# Patient Record
Sex: Male | Born: 1945 | Race: White | Hispanic: No | Marital: Single | Smoking: Never smoker
Health system: Southern US, Community
[De-identification: ages and names within clinical notes are randomized; demographics above are authoritative.]

## PROBLEM LIST (undated history)

## (undated) DIAGNOSIS — I4891 Unspecified atrial fibrillation: Secondary | ICD-10-CM

## (undated) DIAGNOSIS — L0390 Cellulitis, unspecified: Secondary | ICD-10-CM

## (undated) DIAGNOSIS — R0609 Other forms of dyspnea: Secondary | ICD-10-CM

## (undated) DIAGNOSIS — R162 Hepatomegaly with splenomegaly, not elsewhere classified: Secondary | ICD-10-CM

## (undated) DIAGNOSIS — Z972 Presence of dental prosthetic device (complete) (partial): Secondary | ICD-10-CM

## (undated) DIAGNOSIS — I890 Lymphedema, not elsewhere classified: Secondary | ICD-10-CM

## (undated) DIAGNOSIS — I495 Sick sinus syndrome: Secondary | ICD-10-CM

## (undated) DIAGNOSIS — I499 Cardiac arrhythmia, unspecified: Secondary | ICD-10-CM

## (undated) DIAGNOSIS — I2510 Atherosclerotic heart disease of native coronary artery without angina pectoris: Secondary | ICD-10-CM

## (undated) DIAGNOSIS — D649 Anemia, unspecified: Secondary | ICD-10-CM

## (undated) DIAGNOSIS — M069 Rheumatoid arthritis, unspecified: Secondary | ICD-10-CM

## (undated) DIAGNOSIS — R001 Bradycardia, unspecified: Secondary | ICD-10-CM

## (undated) DIAGNOSIS — E119 Type 2 diabetes mellitus without complications: Secondary | ICD-10-CM

## (undated) DIAGNOSIS — J189 Pneumonia, unspecified organism: Secondary | ICD-10-CM

## (undated) DIAGNOSIS — I739 Peripheral vascular disease, unspecified: Secondary | ICD-10-CM

## (undated) DIAGNOSIS — Z7982 Long term (current) use of aspirin: Secondary | ICD-10-CM

## (undated) DIAGNOSIS — M1990 Unspecified osteoarthritis, unspecified site: Secondary | ICD-10-CM

## (undated) DIAGNOSIS — Z9842 Cataract extraction status, left eye: Secondary | ICD-10-CM

## (undated) DIAGNOSIS — M86171 Other acute osteomyelitis, right ankle and foot: Secondary | ICD-10-CM

## (undated) DIAGNOSIS — Z9841 Cataract extraction status, right eye: Secondary | ICD-10-CM

## (undated) DIAGNOSIS — N189 Chronic kidney disease, unspecified: Secondary | ICD-10-CM

## (undated) DIAGNOSIS — M869 Osteomyelitis, unspecified: Secondary | ICD-10-CM

## (undated) DIAGNOSIS — I509 Heart failure, unspecified: Secondary | ICD-10-CM

## (undated) DIAGNOSIS — Z950 Presence of cardiac pacemaker: Secondary | ICD-10-CM

## (undated) DIAGNOSIS — R339 Retention of urine, unspecified: Secondary | ICD-10-CM

## (undated) DIAGNOSIS — I10 Essential (primary) hypertension: Secondary | ICD-10-CM

## (undated) DIAGNOSIS — I00 Rheumatic fever without heart involvement: Secondary | ICD-10-CM

## (undated) DIAGNOSIS — E785 Hyperlipidemia, unspecified: Secondary | ICD-10-CM

## (undated) DIAGNOSIS — D3502 Benign neoplasm of left adrenal gland: Secondary | ICD-10-CM

## (undated) DIAGNOSIS — N183 Chronic kidney disease, stage 3 unspecified: Secondary | ICD-10-CM

## (undated) DIAGNOSIS — I517 Cardiomegaly: Secondary | ICD-10-CM

## (undated) DIAGNOSIS — I96 Gangrene, not elsewhere classified: Secondary | ICD-10-CM

## (undated) HISTORY — DX: Cardiac arrhythmia, unspecified: I49.9

## (undated) HISTORY — PX: COLONOSCOPY: SHX174

## (undated) HISTORY — DX: Other acute osteomyelitis, right ankle and foot: M86.171

## (undated) HISTORY — PX: TOE AMPUTATION: SHX809

## (undated) HISTORY — DX: Cellulitis, unspecified: L03.90

## (undated) HISTORY — PX: TONSILLECTOMY: SUR1361

## (undated) HISTORY — DX: Chronic kidney disease, unspecified: N18.9

## (undated) HISTORY — DX: Heart failure, unspecified: I50.9

---

## 2014-07-05 ENCOUNTER — Inpatient Hospital Stay: Payer: Self-pay | Admitting: Internal Medicine

## 2014-07-05 LAB — CBC WITH DIFFERENTIAL/PLATELET
BASOS ABS: 0.1 10*3/uL (ref 0.0–0.1)
Basophil %: 0.3 %
EOS ABS: 0.1 10*3/uL (ref 0.0–0.7)
Eosinophil %: 0.3 %
HCT: 40.2 % (ref 40.0–52.0)
HGB: 13.4 g/dL (ref 13.0–18.0)
LYMPHS ABS: 1.3 10*3/uL (ref 1.0–3.6)
LYMPHS PCT: 6.7 %
MCH: 30 pg (ref 26.0–34.0)
MCHC: 33.3 g/dL (ref 32.0–36.0)
MCV: 90 fL (ref 80–100)
Monocyte #: 0.9 x10 3/mm (ref 0.2–1.0)
Monocyte %: 4.9 %
Neutrophil #: 16.5 10*3/uL — ABNORMAL HIGH (ref 1.4–6.5)
Neutrophil %: 87.8 %
Platelet: 367 10*3/uL (ref 150–440)
RBC: 4.46 10*6/uL (ref 4.40–5.90)
RDW: 12.4 % (ref 11.5–14.5)
WBC: 18.8 10*3/uL — AB (ref 3.8–10.6)

## 2014-07-05 LAB — URINALYSIS, COMPLETE
BACTERIA: NONE SEEN
BILIRUBIN, UR: NEGATIVE
BLOOD: NEGATIVE
LEUKOCYTE ESTERASE: NEGATIVE
NITRITE: NEGATIVE
Ph: 6 (ref 4.5–8.0)
Protein: NEGATIVE
RBC,UR: 1 /HPF (ref 0–5)
Specific Gravity: 1.03 (ref 1.003–1.030)
Squamous Epithelial: NONE SEEN
WBC UR: 1 /HPF (ref 0–5)

## 2014-07-05 LAB — COMPREHENSIVE METABOLIC PANEL
ALT: 29 U/L
AST: 16 U/L (ref 15–37)
Albumin: 2.5 g/dL — ABNORMAL LOW (ref 3.4–5.0)
Alkaline Phosphatase: 210 U/L — ABNORMAL HIGH
Anion Gap: 8 (ref 7–16)
BILIRUBIN TOTAL: 0.5 mg/dL (ref 0.2–1.0)
BUN: 12 mg/dL (ref 7–18)
Calcium, Total: 8.5 mg/dL (ref 8.5–10.1)
Chloride: 100 mmol/L (ref 98–107)
Co2: 26 mmol/L (ref 21–32)
Creatinine: 1.08 mg/dL (ref 0.60–1.30)
EGFR (African American): >60
EGFR (Non-African Amer.): >60
GLUCOSE: 327 mg/dL — AB (ref 65–99)
Osmolality: 281 (ref 275–301)
POTASSIUM: 3.7 mmol/L (ref 3.5–5.1)
Sodium: 134 mmol/L — ABNORMAL LOW (ref 136–145)
Total Protein: 8.4 g/dL — ABNORMAL HIGH (ref 6.4–8.2)

## 2014-07-05 LAB — PHOSPHORUS: Phosphorus: 3 mg/dL (ref 2.5–4.9)

## 2014-07-05 LAB — PROTIME-INR
INR: 1.1
Prothrombin Time: 14.2 secs (ref 11.5–14.7)

## 2014-07-05 LAB — TROPONIN I

## 2014-07-05 LAB — MAGNESIUM: Magnesium: 1.7 mg/dL — ABNORMAL LOW

## 2014-07-06 LAB — BASIC METABOLIC PANEL
Anion Gap: 8 (ref 7–16)
BUN: 8 mg/dL (ref 7–18)
CHLORIDE: 104 mmol/L (ref 98–107)
CREATININE: 0.91 mg/dL (ref 0.60–1.30)
Calcium, Total: 7.8 mg/dL — ABNORMAL LOW (ref 8.5–10.1)
Co2: 24 mmol/L (ref 21–32)
EGFR (African American): >60
Glucose: 215 mg/dL — ABNORMAL HIGH (ref 65–99)
Osmolality: 277 (ref 275–301)
Potassium: 3.5 mmol/L (ref 3.5–5.1)
SODIUM: 136 mmol/L (ref 136–145)

## 2014-07-06 LAB — CBC WITH DIFFERENTIAL/PLATELET
Basophil #: 0.1 10*3/uL (ref 0.0–0.1)
Basophil %: 0.3 %
EOS ABS: 0.1 10*3/uL (ref 0.0–0.7)
Eosinophil %: 0.5 %
HCT: 33.4 % — ABNORMAL LOW (ref 40.0–52.0)
HGB: 11 g/dL — ABNORMAL LOW (ref 13.0–18.0)
Lymphocyte #: 1.3 10*3/uL (ref 1.0–3.6)
Lymphocyte %: 8.5 %
MCH: 29.7 pg (ref 26.0–34.0)
MCHC: 32.8 g/dL (ref 32.0–36.0)
MCV: 91 fL (ref 80–100)
MONO ABS: 0.9 x10 3/mm (ref 0.2–1.0)
Monocyte %: 5.6 %
Neutrophil #: 13 10*3/uL — ABNORMAL HIGH (ref 1.4–6.5)
Neutrophil %: 85.1 %
Platelet: 257 10*3/uL (ref 150–440)
RBC: 3.68 10*6/uL — AB (ref 4.40–5.90)
RDW: 12.2 % (ref 11.5–14.5)
WBC: 15.3 10*3/uL — ABNORMAL HIGH (ref 3.8–10.6)

## 2014-07-06 LAB — HEMOGLOBIN A1C: HEMOGLOBIN A1C: 10.4 % — AB (ref 4.2–6.3)

## 2014-07-07 LAB — URINE CULTURE

## 2014-07-07 LAB — CBC WITH DIFFERENTIAL/PLATELET
BASOS ABS: 0.1 10*3/uL (ref 0.0–0.1)
Basophil %: 0.5 %
EOS ABS: 0.1 10*3/uL (ref 0.0–0.7)
Eosinophil %: 1.1 %
HCT: 33.2 % — ABNORMAL LOW (ref 40.0–52.0)
HGB: 10.8 g/dL — AB (ref 13.0–18.0)
LYMPHS ABS: 1.4 10*3/uL (ref 1.0–3.6)
LYMPHS PCT: 11.4 %
MCH: 29.2 pg (ref 26.0–34.0)
MCHC: 32.6 g/dL (ref 32.0–36.0)
MCV: 89 fL (ref 80–100)
MONOS PCT: 6.8 %
Monocyte #: 0.8 x10 3/mm (ref 0.2–1.0)
NEUTROS ABS: 9.5 10*3/uL — AB (ref 1.4–6.5)
Neutrophil %: 80.2 %
Platelet: 293 10*3/uL (ref 150–440)
RBC: 3.72 10*6/uL — ABNORMAL LOW (ref 4.40–5.90)
RDW: 12.3 % (ref 11.5–14.5)
WBC: 11.9 10*3/uL — ABNORMAL HIGH (ref 3.8–10.6)

## 2014-07-07 LAB — BASIC METABOLIC PANEL
Anion Gap: 7 (ref 7–16)
BUN: 7 mg/dL (ref 7–18)
CALCIUM: 7.9 mg/dL — AB (ref 8.5–10.1)
CHLORIDE: 106 mmol/L (ref 98–107)
Co2: 24 mmol/L (ref 21–32)
Creatinine: 0.84 mg/dL (ref 0.60–1.30)
EGFR (African American): >60
EGFR (Non-African Amer.): >60
Glucose: 151 mg/dL — ABNORMAL HIGH (ref 65–99)
Osmolality: 275 (ref 275–301)
POTASSIUM: 3.6 mmol/L (ref 3.5–5.1)
SODIUM: 137 mmol/L (ref 136–145)

## 2014-07-07 LAB — LIPID PANEL
Cholesterol: 102 mg/dL (ref 0–200)
HDL Cholesterol: 25 mg/dL — ABNORMAL LOW (ref 40–60)
Ldl Cholesterol, Calc: 55 mg/dL (ref 0–100)
Triglycerides: 112 mg/dL (ref 0–200)
VLDL CHOLESTEROL, CALC: 22 mg/dL (ref 5–40)

## 2014-07-07 LAB — MAGNESIUM: Magnesium: 1.8 mg/dL

## 2014-07-07 LAB — VANCOMYCIN, TROUGH: Vancomycin, Trough: 16 ug/mL (ref 10–20)

## 2014-07-08 LAB — CBC WITH DIFFERENTIAL/PLATELET
Basophil #: 0.1 10*3/uL (ref 0.0–0.1)
Basophil %: 0.7 %
EOS ABS: 0.2 10*3/uL (ref 0.0–0.7)
Eosinophil %: 2.3 %
HCT: 35 % — AB (ref 40.0–52.0)
HGB: 11.9 g/dL — ABNORMAL LOW (ref 13.0–18.0)
LYMPHS PCT: 15.1 %
Lymphocyte #: 1.5 10*3/uL (ref 1.0–3.6)
MCH: 30.1 pg (ref 26.0–34.0)
MCHC: 34 g/dL (ref 32.0–36.0)
MCV: 89 fL (ref 80–100)
MONOS PCT: 7.5 %
Monocyte #: 0.7 x10 3/mm (ref 0.2–1.0)
NEUTROS PCT: 74.4 %
Neutrophil #: 7.4 10*3/uL — ABNORMAL HIGH (ref 1.4–6.5)
Platelet: 343 10*3/uL (ref 150–440)
RBC: 3.95 10*6/uL — ABNORMAL LOW (ref 4.40–5.90)
RDW: 12.5 % (ref 11.5–14.5)
WBC: 9.9 10*3/uL (ref 3.8–10.6)

## 2014-07-08 LAB — BASIC METABOLIC PANEL
Anion Gap: 8 (ref 7–16)
BUN: 7 mg/dL (ref 7–18)
CO2: 24 mmol/L (ref 21–32)
Calcium, Total: 8.2 mg/dL — ABNORMAL LOW (ref 8.5–10.1)
Chloride: 108 mmol/L — ABNORMAL HIGH (ref 98–107)
Creatinine: 0.94 mg/dL (ref 0.60–1.30)
EGFR (African American): >60
EGFR (Non-African Amer.): >60
GLUCOSE: 118 mg/dL — AB (ref 65–99)
OSMOLALITY: 278 (ref 275–301)
Potassium: 3.6 mmol/L (ref 3.5–5.1)
Sodium: 140 mmol/L (ref 136–145)

## 2014-07-08 LAB — SEDIMENTATION RATE: Erythrocyte Sed Rate: >140 mm/hr — ABNORMAL HIGH (ref 0–20)

## 2014-07-09 LAB — BASIC METABOLIC PANEL
Anion Gap: 9 (ref 7–16)
BUN: 10 mg/dL (ref 7–18)
Calcium, Total: 8.5 mg/dL (ref 8.5–10.1)
Chloride: 109 mmol/L — ABNORMAL HIGH (ref 98–107)
Co2: 23 mmol/L (ref 21–32)
Creatinine: 1.08 mg/dL (ref 0.60–1.30)
EGFR (African American): >60
EGFR (Non-African Amer.): >60
GLUCOSE: 113 mg/dL — AB (ref 65–99)
Osmolality: 281 (ref 275–301)
POTASSIUM: 3.4 mmol/L — AB (ref 3.5–5.1)
Sodium: 141 mmol/L (ref 136–145)

## 2014-07-09 LAB — CBC WITH DIFFERENTIAL/PLATELET
BASOS ABS: 0.1 10*3/uL (ref 0.0–0.1)
Basophil %: 1 %
EOS ABS: 0.3 10*3/uL (ref 0.0–0.7)
Eosinophil %: 3.4 %
HCT: 34.3 % — AB (ref 40.0–52.0)
HGB: 11.1 g/dL — AB (ref 13.0–18.0)
Lymphocyte #: 1.5 10*3/uL (ref 1.0–3.6)
Lymphocyte %: 17.6 %
MCH: 29.1 pg (ref 26.0–34.0)
MCHC: 32.3 g/dL (ref 32.0–36.0)
MCV: 90 fL (ref 80–100)
Monocyte #: 0.7 x10 3/mm (ref 0.2–1.0)
Monocyte %: 8.2 %
Neutrophil #: 6.1 10*3/uL (ref 1.4–6.5)
Neutrophil %: 69.8 %
PLATELETS: 358 10*3/uL (ref 150–440)
RBC: 3.8 10*6/uL — AB (ref 4.40–5.90)
RDW: 12.4 % (ref 11.5–14.5)
WBC: 8.7 10*3/uL (ref 3.8–10.6)

## 2014-07-09 LAB — PATHOLOGY REPORT

## 2014-07-10 LAB — BASIC METABOLIC PANEL
Anion Gap: 9 (ref 7–16)
BUN: 10 mg/dL (ref 7–18)
CALCIUM: 8.3 mg/dL — AB (ref 8.5–10.1)
Chloride: 109 mmol/L — ABNORMAL HIGH (ref 98–107)
Co2: 24 mmol/L (ref 21–32)
Creatinine: 1.26 mg/dL (ref 0.60–1.30)
EGFR (African American): >60
Glucose: 134 mg/dL — ABNORMAL HIGH (ref 65–99)
Osmolality: 284 (ref 275–301)
POTASSIUM: 3.7 mmol/L (ref 3.5–5.1)
SODIUM: 142 mmol/L (ref 136–145)

## 2014-07-10 LAB — MAGNESIUM: Magnesium: 1.8 mg/dL

## 2014-07-10 LAB — CULTURE, BLOOD (SINGLE)

## 2014-07-12 LAB — CULTURE, BLOOD (SINGLE)

## 2014-07-13 LAB — WOUND CULTURE

## 2014-09-08 ENCOUNTER — Ambulatory Visit: Payer: Self-pay | Admitting: Podiatry

## 2014-09-12 LAB — WOUND CULTURE

## 2015-01-15 NOTE — Discharge Summary (Signed)
PATIENT NAME:  Tyler FountainWARNING, Gurley A MR#:  [id number] DATE OF BIRTH:  [birth year]  DATE OF ADMISSION:  07/05/2014 DATE OF DISCHARGE:  07/11/2014  ADMITTING DIAGNOSIS: Sepsis due to cellulitis.   DISCHARGE DIAGNOSES: 1. Left great toe osteomyelitis and gangrene, status post left great toe amputation on 07/06/2014 by Dr. Alberteen Spindleline.  2. Left foot cellulitis was group F Staphylococcus as well as Citrobacter koseri, group F streptococcal sepsis due to osteomyelitis status post PICC line placement to the right upper extremity on 07/09/2014.  3. History of diabetes mellitus with hemoglobin A1c 10.5, uncontrolled.  4. Hypomagnesemia.  5. Obesity.   DISCHARGE CONDITION: Stable.   DISCHARGE MEDICATIONS: The patient is to continue acetaminophen/hydrocodone 325 mg/7.5 mg 1 tablet every 4 hours as needed, glipizide 10 mg twice daily, metformin 1 gram twice daily and Rocephin 2 grams IV every 24 hours through 07/20/2014.   The patient is being discharged with home health physical therapy, as well as nurse. Nurse is recommended to continue PICC line care per protocol.   HOME OXYGEN: None.   DIET: Two gram salt, low-fat, low-cholesterol, carbohydrate-controlled diet, regular consistency.   ACTIVITY LIMITATIONS: As tolerated.   REFERRAL: To home health PT as well as RN. as mentioned above.    FOLLOWUP APPOINTMENT: With Dr. Beverely RisenFozia Khan in 2 days after discharge, Dr. Sampson GoonFitzgerald on 07/20/2014 at noon in his office. Also, Dr. Alberteen Spindleline, podiatry, in 2 days after discharge.   CONSULTANTS: Care management, social work, Dr. Sampson GoonFitzgerald, Dr. Alberteen Spindleline, Dr. Wyn Quakerew and Dr. Toni Amendlapacs.   RADIOLOGIC STUDIES: Left foot complete x-ray from 07/05/2014 showed apparent soft tissue deformity with multiple scattered punctate foci of subcutaneous emphysema about the tuft of great toe. No discrete area of osteomyelitis given large field of view. Further evaluation could be performed with dedicated radiographs of the left great toe and a foot MRI is  clinically indicated, according to radiology. Chest portable single view x-ray 07/05/2014 revealed no active disease.   The patient is a 69 year old male with an unremarkable past medical history, although not being seen by a primary care physician for a long period of time. He presented to the hospital with complaints of left foot pain. Please refer to Dr. Jarrett SohoHower's admission note on 07/05/2014. On arrival to the hospital, the patient's temperature was 99.5, pulse 136, respiration rate was 22, blood pressure 199/95, saturation was 97% on room air. Physical exam revealed left great toe gross areas of necrosis with foul odor as well as discharge and surrounding left lower extremity erythema with edema and diminished sensation.   [name] laboratory data done on admission showed sodium level of 134, glucose of 327, magnesium of 1.7. Hemoglobin A1c was found to be 10.4. Otherwise BMP was unremarkable. [name] liver enzymes revealed an albumin of 2.5, alkaline phosphatase 210. Total protein was 8.4. Otherwise, liver enzymes were unremarkable. Troponin was less than 0.02. CBC: White blood cell count was 18.8, hemoglobin was 13.4, platelet count was 367. Absolute neutrophil count was elevated at 16.5. Coagulation panel was unremarkable. Blood cultures taken on 07/05/2014 revealed group F streptococcus in aerobic as well as anaerobic bottles. Urine culture did not show any growth. Urinalysis was unremarkable. [name] lactic acid level was 1.4.   EKG showed sinus tachycardia at 116 beats per minute, a right bundle-branch block, as well as left anterior fascicular block, but no acute ST-T changes were noted. A chest x-ray was unremarkable. Left foot x-ray revealed abnormalities.   The patient was admitted to the hospital for further  evaluation. Consultation with Dr. Alberteen Spindle was obtained due to left toe osteomyelitis and gangrene of the left hallux. Dr. Alberteen Spindle recommended getting debridement as well as  amputation done of the left hallux, which was performed on 07/06/2014. Postoperatively, the patient did not do badly. He was continued on broad spectrum antibiotic until identification of wound cultures as well as blood cultures came back. Blood cultures, as mentioned above, showed group F streptococcus. Wound cultures done during the operation on 07/06/2014, anaerobic as well as aerobic cultures showed Staphylococcus aureus, methicillin sensitive, moderate growth, as well as Citrobacter koseri, light growth. Staphylococcus was resistant to erythromycin; however, sensitive to all antibiotics. Citrobacter koseri was also sensitive to all antibiotics. The wound cultures also revealed group F Streptococcus, moderate growth.   It was felt that the patient had Streptococcus bacteremia sepsis due to osteomyelitis. Repeated blood cultures then on 07/07/2014 did not show any growth. The patient was seen by Dr. Sampson Goon. who recommended antibiotic therapy. Dr. Sampson Goon recommended antibiotics through 07/20/2014, which would be approximately 9 to 10 days from the day of discharge, 07/11/2014. The patient is to follow up with Dr. Sampson Goon on 07/20/2014 at noon in his office for consideration of oral antibiotic therapy. On the day of discharge, the patient felt satisfactory, did not complain of any significant discomfort or pain. His vitals were stable with temperature of 98.6, pulse was 89 to 104, respiration rate was 18, blood pressure 156/84, saturation was 94% to 96% on room air at rest. The patient is to continue followup with Dr. Alberteen Spindle in regards to antibiotic administration. PICC was placed to right upper extremity on 07/09/2014.   In regards to diabetes mellitus, the patient's hemoglobin A1c was found to be 10.5. It was felt to be poorly controlled diabetes mellitus, which led him to foot wound and osteomyelitis. The patient was initially started on insulin; however, because of concerns of compliance, his  medications were changed to oral. The patient is to continue glipizide as well as metformin and to establish a primary physician, Dr. Beverely Risen, in the next few days after discharge. In regards to hypomagnesemia, which was noted during the day of admission, the patient's magnesium level was replenished. The patient is being discharged in stable condition with the above-mentioned medications and followup.   TIME SPENT: 40 minutes on this patient.     ____________________________ Katharina Caper, MD rv:TT D: 07/11/2014 12:38:46 ET T: 07/11/2014 14:41:34 ET JOB#: [id number]  cc: Katharina Caper, MD, <Dictator> Lyndon Code, MD Stann Mainland. Sampson Goon, MD Linus Galas, [name] Shantee Hayne MD ELECTRONICALLY SIGNED 07/23/2014 12:47

## 2015-01-15 NOTE — Op Note (Signed)
PATIENT NAME:  Tyler FountainWARNING, Tyler [name] MR#:  [id number] DATE OF BIRTH:  1946/08/26  DATE OF PROCEDURE:  07/09/2014  PREOPERATIVE DIAGNOSIS:  Sepsis due to cellulitis and osteomyelitis of the left foot.   POSTOPERATIVE DIAGNOSIS:  Sepsis due to cellulitis and osteomyelitis of the left foot.   PROCEDURES:  1. Ultrasound guidance for vascular access to right basilic vein.  2. Fluoroscopic guidance for placement of catheter.  3. Insertion of peripherally inserted central venous catheter, right arm.  SURGEON: Annice NeedyJason S. Jayceion Lisenby, MD.  ANESTHESIA: Local.   ESTIMATED BLOOD LOSS: Minimal.   INDICATION FOR PROCEDURE: This is [name] 69 year old gentleman with osteomyelitis of his foot who needs extended IV antibiotics and we are  asked to place [name] PICC line.   DESCRIPTION OF PROCEDURE: [name] right arm was sterilely prepped and draped, and [name] sterile surgical field was created. The right basilic vein was accessed under direct ultrasound guidance without difficulty with [name] micropuncture needle and permanent image was recorded. 0.018 wire was then placed into the superior vena cava. Peel-away sheath was placed over the wire. [name] single lumen peripherally inserted central venous catheter was then placed over the wire and the wire and peel-away sheath were removed. The catheter tip was placed into the superior vena cava and was secured at the skin at 37 cm with [name] sterile dressing. The catheter withdrew blood well and flushed easily with heparinized saline. The patient tolerated procedure well.   ____________________________ Annice NeedyJason S. Ariyannah Pauling, MD jsd:lt D: 07/10/2014 10:54:50 ET T: 07/10/2014 11:12:59 ET JOB#: [id number]  cc: Annice NeedyJason S. Srihaan Mastrangelo, MD, <Dictator> Annice NeedyJASON S Dreyson Mishkin MD ELECTRONICALLY SIGNED 07/10/2014 12:39

## 2015-01-15 NOTE — Consult Note (Signed)
Psychiatry: Patient seen and chart reviewed. Patient has no new complaint. Affect euthymic. Says pain is well controlled. No evidence of bizarre or impaired thinking. Affect euthymic, thoughts lucid.   Supportive counceling. No change to treatment and no indication for medication.  Electronic Signatures: Kriston Mckinnie, Jackquline DenmarkJohn T (MD)  (Signed on 14-Oct-15 18:52)  Authored  Last Updated: 14-Oct-15 18:52 by Audery Amellapacs, Ab T (MD)

## 2015-01-15 NOTE — H&P (Signed)
PATIENT NAME:  Tyler [name], BARTELT MR#:  [id number] DATE OF BIRTH:  [birth year]  DATE OF ADMISSION:  07/05/2014  REFERRING PHYSICIAN:  Soyla Murphy, MD   PRIMARY CARE PHYSICIAN:  None.  CHIEF COMPLAINT:  Foot pain.   HISTORY OF PRESENT ILLNESS:  This is a 69 year old Caucasian gentleman with a significant past medical history, however, has no PCP or sought medical care for numerous years, presenting with left foot pain. He describes a 3- to 4-day duration of left foot pain. He described it only as "pain." It is 8 to 9 out of 10 in intensity, worse over his great toe with radiation to the entire foot, worse with walking. No relieving factors. Associated with erythema and edema, as well as foul discharge. The patient himself denies any fevers, chills, or further symptomatology.   REVIEW OF SYSTEMS: CONSTITUTIONAL:  Denies fever, chills, fatigue, or weakness.  EYES:  No blurred vision, double vision, or eye pain. EARS, NOSE, AND THROAT:  Denies tinnitus, ear pain, or hearing loss.  RESPIRATORY:  Denies cough or shortness of breath. CARDIOVASCULAR:  Denies chest pain, palpitations, or edema. GASTROINTESTINAL: Denies nausea, vomiting, diarrhea, or abdominal pain. GENITOURINARY:  Denies dysuria or hematuria.  ENDOCRINE:  Denies nocturia or thyroid problems.  HEMATOLOGIC AND LYMPHATIC:  Denies easy bruising or bleeding. SKIN:  Positive for erythematous region of the left leg going up to mid shin, as well as necrosis of the left great toe. Otherwise, no further lesions, rashes, or cyanosis.  MUSCULOSKELETAL:  Positive for left leg pain as described above. Denies any neck, back, shoulder, knee, or hip pain. Denies further arthritic symptoms.  NEUROLOGIC:  Positive for numbness in the bilateral lower extremities, peripheral of the feet; however, this is chronic. Denies any weakness.  PSYCHIATRIC:  Denies anxiety or depressive symptoms.   Otherwise, full review of systems performed by me is negative.    PAST MEDICAL HISTORY:  None, however, has no PCP and has not sought medical care in numerous years.  SOCIAL HISTORY:  Denies alcohol, tobacco, or drug usage.   FAMILY HISTORY:  Positive for type 2 diabetes.   ALLERGIES:  No known drug allergies.   HOME MEDICATIONS:  None.   PHYSICAL EXAMINATION: VITAL SIGNS:  Temperature 99.5, heart rate 136, respirations 22, blood pressure 199/95, saturating 97% on room air. Weight 111.1 kg, BMI 28.3.  GENERAL:  Well-nourished, well-developed Caucasian gentleman, currently in no acute distress.  HEAD:  Normocephalic, atraumatic.  EYES:  Pupils are equal and reactive to light. Extraocular muscles are intact. No scleral icterus.  MOUTH:  Moist mucosal membranes. Dentition is intact. No abscess noted.  EARS, NOSE, AND THROAT:  Clear without exudates. No external lesions.  NECK:  Supple. No thyromegaly. No nodules. No JVD.  PULMONARY:  Clear to auscultation bilaterally without wheezes, rales, or rhonchi. No accessory muscles. Good respiratory effort.  CHEST:  Nontender to palpation.  CARDIOVASCULAR:  S1, S2 tachycardic. No murmurs, rubs, or gallops. The left lower extremity has edema 2+ of the knee. Range of motion is full in all extremities.  NEUROLOGIC:  Cranial nerves II through XII are intact. No gross focal neurological deficits. Gross sensation in the feet but diminished.  SKIN:  Left great toe has gross areas of necrosis with foul odor and discharge, which is serous. There is also erythema up to around the mid shin of the left lower extremity with edema as stated above. Sensation is diminished as stated above.  PSYCHIATRIC:  Mood and affect are  within normal limits. The patient is awake, alert, and oriented x 3. Insight and judgment are intact.   LABORATORY DATA:  Sodium 134, potassium 3.7, chloride 100, bicarbonate 26, BUN 12, creatinine 1.08, glucose 327, magnesium 1.7. LFTs: Protein 8.4, albumin 2.5, alkaline phosphatase 210, otherwise within  normal limits. WBC 18.8, hemoglobin 13.4, platelets 367. Urinalysis is negative for evidence of infection but does have glucosuria with lactic acid of 1.4.   Chest x-ray performed: No acute cardiopulmonary process.   X-ray of the foot performed: Soft tissue deformity with as well as subcutaneous emphysema noted in the great toe.   ASSESSMENT AND PLAN:  A 69 year old Caucasian gentleman without significant past medical history, presenting with left foot pain.   1.  Sepsis due to cellulitis plus potential osteomyelitis of the left great toe indicated by heart rate, leukocytosis, and respiratory rate on arrival. Intravenous fluid received in the Emergency Department, 30 mL/kg bolus. Will continue IV fluid hydration to keep mean arterial pressure greater than 65. He does not meet severe sepsis criteria. Lactic acid was normal. Panculture. Antibiotics with vancomycin and Zosyn. Will also consult podiatry.  2.  Hyperglycemia. I suspect this patient does have diabetes. Add insulin sliding scale with q. 6 hour Accu-Cheks. Will check hemoglobin A1C.  3.  Hypomagnesemia. Replace to goal of 2.  4.  Venous thromboembolism prophylaxis with sequential compression devices. Will hold heparin in preparation for suspected surgery.   CODE STATUS:  The patient is a full code.   TIME SPENT:  45 minutes.   ____________________________ Cletis Athensavid K. Alaiza Yau, MD dkh:nb D: 07/05/2014 22:42:26 ET T: 07/05/2014 23:05:16 ET JOB#: [id number]  cc: Cletis Athensavid K. Sheila Gervasi, MD, <Dictator> Jasa Dundon Synetta ShadowK Salbador Fiveash MD ELECTRONICALLY SIGNED 07/06/2014 0:46

## 2015-01-15 NOTE — Consult Note (Signed)
PATIENT NAME:  Tyler [name], Tyler [name] MR#:  [id number] DATE OF BIRTH:  [birth year]  DATE OF CONSULTATION:  07/07/2014  CONSULTING PHYSICIAN:  Stann Mainland. Sampson Goon, MD  REQUESTING PHYSICIAN:  Dr. Allena Katz   CONSULTING PODIATRIST: Linus Galas, [name]   REASON FOR CONSULTATION: Diabetic foot infection and osteomyelitis.   HISTORY OF PRESENT ILLNESS: This is a pleasant 69 year old gentleman who has not seen a primary care doctor in many years who has had ongoing left foot pain with a nonhealing sore that he says has been present for several years. It progressed over several days prior to admission. There was erythema and edema and also drainage from the wound. The patient was seen in the Emergency Room where he was found to have sepsis and cellulitis with likely underlying osteomyelitis. His white blood count was elevated at 18,000. The patient had an x-ray that showed soft tissue deformity and possible osteomyelitis. He underwent on October 13 surgery by Dr. Alberteen Spindle with amputation of the left great toe and excisional debridement of infected tissue. He had cultures sent from the left great distal toe phalanx. Clinically, he is improving. He is on IV antibiotics at this point. We are consulted for further antibiotic management.   PAST MEDICAL HISTORY: The patient denies any but he has an elevated A1c in his diabetes.   PAST SURGICAL HISTORY: The patient denies.   FAMILY HISTORY: Noncontributory.   SOCIAL HISTORY: The patent denies alcohol, tobacco, or drugs.   REVIEW OF SYSTEMS: There are 11 systems reviewed and negative except as per HPI.   PHYSICAL EXAMINATION:  VITAL SIGNS: Temperature 98.7 he has been afebrile since admission. Pulse 87, blood pressure 152/81, respirations 20, saturation 96% on room air.  GENERAL: He is disheveled, lying in bed in no acute distress.  HEENT: Pupils equal, round, reactive to light and accommodation. Extraocular movements are intact. Sclerae are anicteric. Oropharynx clear.   NECK: Supple.  HEART: Regular.  LUNGS: Clear to auscultation bilaterally.  ABDOMEN: Soft, nontender, nondistended  EXTREMITIES: He foot is wrapped postoperatively.  NEUROLOGIC: He is alert and oriented x 3.  Grossly nonfocal neurologic exam.   LABORATORY DATA AND IMAGING:  Left foot, apparent soft tissue deformity with scattered punctate foci subcutaneous emphysema at the tuft of the great toe.  Chest x-ray October 12 showed no active disease. Microbiology blood cultures done October 12 are growing gram-positive cocci in chains in 2 of 2 blood cultures. Urine culture is negative. Follow-up blood cultures after the 14th  are no growth to date. Cultures from bone done October 13 of the left great toe are being followed for possible pathogen. There are a few gram-positive cocci in pairs and few gram variable rods.  White blood count on admission was 18.8; currently it is 11.9, hemoglobin 10.8, platelets 293,000. Renal function shows normal creatinine 0.84. A1c is elevated at 10.4. LFTs showed elevated alkaline phosphatase and low albumin at 2.5,  otherwise normal. INR was normal. Urinalysis was negative.   IMPRESSION: A 69 year old gentleman with poorly controlled diabetes which he was unaware of and he had not sought medical care for many years. He was admitted with chronic toe wound that progressed to the infection with likely underlying osteomyelitis. He underwent amputation October 13. He also has bacteremia with gram-positive cocci in chains. He has been on IV antibiotics since admission and has stabilized with now a normalizing white count. He has been afebrile.   RECOMMENDATIONS:  1. Continue vancomycin and Zosyn pending identification of the cultures. He likely has  group B strep bacteremia, but we can await cultures.  2. He would likely need at least a 2 week course of IV antibiotics. Further recommendations would be based on culture results.  3. Once his followup cultures are cleared he can  have a PICC line placed.   4. He will need wound care as well as better diabetic control.   Thank you for the consultation.  We will be glad to follow with you.     ____________________________ Stann Mainlandavid P. Sampson GoonFitzgerald, MD dpf:JT D: 07/07/2014 20:53:48 ET T: 07/07/2014 22:47:06 ET JOB#: [id number]  cc: Stann Mainlandavid P. Sampson GoonFitzgerald, MD, <Dictator> DAVID Sampson GoonFITZGERALD MD ELECTRONICALLY SIGNED 07/20/2014 9:53

## 2015-01-15 NOTE — Consult Note (Signed)
PATIENT NAME:  Tyler FountainWARNING, Tyler [name] MR#:  [id number] DATE OF BIRTH:  [birth year]  DATE OF CONSULTATION:  07/06/2014  REFERRING PHYSICIAN:   CONSULTING PHYSICIAN:  Tyler [name] Tyler [name]  REASON FOR CONSULTATION: This is [name] 69 year old male who was recently admitted to the hospital with sepsis. He has described [name] recent duration of foot pain on his left foot for the past several days. He denies any specific injury. It started in the great toe and now it is spreading up onto the foot. He denies any previous past medical history.    PAST MEDICAL HISTORY: Unremarkable. Denies any previous diagnosis of diabetes. He has not seen his primary care in at least 2 years.   PAST SURGICAL HISTORY: Tonsillectomy as [name] child.   MEDICATIONS: None.   ALLERGIES: No known drug allergies.   FAMILY HISTORY: Positive for diabetes type 2.   SOCIAL HISTORY: Denies any alcohol or tobacco use.   REVIEW OF SYSTEMS: CONSTITUTIONAL: Denies any fever or chills.  EYES: Denies blurry or double vision.  EARS, NOSE AND THROAT: No tinnitus, ear pain or hearing loss.  RESPIRATORY: Denies cough or shortness of breath.  CARDIOVASCULAR: Denies chest pain, palpitations, or edema.  GASTROINTESTINAL: No nausea, vomiting, or abdominal pain.  GENITOURINARY: No dysuria or hematuria.  ENDOCRINE: Denies nocturia or thyroid problems.  HEMATOLOGIC AND LYMPHATIC: Denies easy bruising or bleeding.  INTEGUMENT: Has some significant redness and swelling in the left foot with some drainage and foul odor from around the great toe.  MUSCULOSKELETAL: Relates some pain in the left foot and leg. Otherwise denies any arthritic symptoms.  NEUROLOGICAL: Some numbness out into the toes.  PSYCHIATRIC: Denies anxiety or depressive symptoms.   PHYSICAL EXAMINATION: VASCULAR: DP and PT pulses are palpable. Capillary filling time intact with the exception of the left hallux which is gangrenous.  NEUROLOGICAL: There is loss of light touch sensation to the  forefoot and digits. He does pick his sensation back up at about the level of the mid foot and proximal up to the ankle.  INTEGUMENT: The skin is warm, dry, and supple. There is significant edema and erythema in the left foot with gangrenous changes to the entire distal tip of the left hallux. [name] heavy amount of purulent and malodorous drainage is present.  MUSCULOSKELETAL: There appears to be adequate range of motion. Muscle testing is deferred.   X-RAYS: Multiple views of the left foot reveal destructive changes at the distal phalanx on the left hallux consistent with osteomyelitis. There are signs of subcutaneous gas in the tissues from about the IPJ distal. Also noted are significant small and medium vessel calcifications in the level of the foot and ankle.   LABORATORY: The patient was admitted with [name] white count of 18.8 thousand. Significant left shift in neutrophils.   IMPRESSION: 1.  Osteomyelitis with gangrene, left hallux.  2.  Probable undiagnosed diabetes.   PLAN: The patient is n.p.o. currently. We will obtain consent form for debridement and amputation of the left hallux. I discussed with the patient that we need to remove all of the infected tissue and portion of the toe, but this may need redebridement depending on his healing. We will plan for surgery later on this afternoon since he has been n.p.o. since breakfast. We will continue on his antibiotics and follow accordingly.    ____________________________ Tyler [name] Tyler [name], [name] tc:at D: 07/06/2014 13:28:22 ET T: 07/06/2014 13:52:57 ET JOB#: [id number]  cc: Tyler [name] Tyler [name], [name], <Dictator> Tyler [name] [name] ELECTRONICALLY SIGNED 07/19/2014 11:05

## 2015-01-15 NOTE — Op Note (Signed)
PATIENT NAME:  Tyler FountainWARNING, Orvell A MR#:  [id number] DATE OF BIRTH:  Mar 07, 1946  DATE OF PROCEDURE:  07/06/2014  PREOPERATIVE DIAGNOSIS: Osteomyelitis with gangrene left great toe.   POSTOPERATIVE DIAGNOSIS: Osteomyelitis with gangrene, left great toe.   PROCEDURE: Amputation left great toe with excisional debridement infected tissue.   ANESTHESIA: General LMA.   HEMOSTASIS: Pneumatic tourniquet left ankle 250 mmHg.   ESTIMATED BLOOD LOSS: Minimal.   PATHOLOGY: Left great toe.   CULTURES: Bone cultures left great toe distal phalanx.   DRAINS: None.   IMPLANTABLES: Stimulan Rapid Cure antibiotic beads impregnated with gentamicin and vancomycin.   COMPLICATIONS: None apparent.   OPERATIVE INDICATIONS: This is a 69 year old male with recent admission due to gangrenous changes with osteomyelitis in his left great toe. Gas was noted in the tissues on x-ray and decision was made for amputation of the left great toe.   OPERATIVE PROCEDURE: The patient was taken to the operating room and placed on the table in the supine position. Following satisfactory general anesthesia a pneumatic tourniquet was applied at the level of the left ankle and the foot was prepped and draped in the usual sterile fashion. The foot was exsanguinated and the tourniquet was inflated to 250 mmHg. Attention was then directed to the distal aspect of the left foot where a severely gangrenous and necrotic left great toe was noted. An elliptical-type incision was made coursing medial to lateral around the base of the great toe. The incision was carried sharply down to the level of the bone and dissection was carried back proximally to the joint where the toe was disarticulated at the metatarsophalangeal joint. There was noted to be heavy purulence and necrosis noted in the dorsal and plantar tissues at the base of the toe. Following the amputation excisional debridement of the purulent and necrotic tissue remaining along the skin  edges was performed using a VersaJet debrider on a setting of 6. Following debridement there was noted to be good healthy-appearing tissues. The wound was then copiously irrigated with pulsed irrigation with the remaining portion of the 3 liter bag to the wound. The incision was then partially closed using 4-0 nylon simple interrupted sutures. Through the distal opening the wound was then packed with Stimulan Rapid Cure beads impregnated with vancomycin and gentamicin into the wound. The remainder of the incision was then closed using 4-0 nylon simple interrupted sutures. Xeroform, 4 x 4s, fluffs, ABDs, and a Kerlix were then applied to the left foot and the tourniquet was released. An additional Kerlix and an Ace wrap were then applied for compression. The patient was taken to the PACU with vital signs stable and in good condition, having tolerated the procedure well.    ____________________________ Linus Galasodd Shallen Luedke, [name] tc:bu D: 07/06/2014 19:45:41 ET T: 07/06/2014 20:37:31 ET JOB#: [id number]  cc: Linus Galasodd Kjersten Ormiston, [name], <Dictator> Euclide Granito [name] ELECTRONICALLY SIGNED 07/19/2014 11:05
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
## 2015-01-15 NOTE — Consult Note (Signed)
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   Patient was seen by consultant.   Consult note dictated.   Comments: Psychiatry: Patient seen and chart reviewed. Note dictated. No indication for any treatment psychiatricly. Will follow as needed.  Electronic Signatures: Clapacs, Jackquline DenmarkJohn T (MD)  (Signed 13-Oct-15 16:37)  Authored: Brief Consult Note   Last Updated: 13-Oct-15 16:37 by Audery Amellapacs, Emery T (MD)

## 2017-01-14 ENCOUNTER — Encounter: Payer: Self-pay | Admitting: *Deleted

## 2017-01-15 NOTE — Discharge Instructions (Signed)
Cataract Surgery, Care After °Refer to this sheet in the next few weeks. These instructions provide you with information about caring for yourself after your procedure. Your health care provider may also give you more specific instructions. Your treatment has been planned according to current medical practices, but problems sometimes occur. Call your health care provider if you have any problems or questions after your procedure. °What can I expect after the procedure? °After the procedure, it is common to have: °· Itching. °· Discomfort. °· Fluid discharge. °· Sensitivity to light and to touch. °· Bruising. °Follow these instructions at home: °Eye Care  °· Check your eye every day for signs of infection. Watch for: °¨ Redness, swelling, or pain. °¨ Fluid, blood, or pus. °¨ Warmth. °¨ Bad smell. °Activity  °· Avoid strenuous activities, such as playing contact sports, for as long as told by your health care provider. °· Do not drive or operate heavy machinery until your health care provider approves. °· Do not bend or lift heavy objects . Bending increases pressure in the eye. You can walk, climb stairs, and do light household chores. °· Ask your health care provider when you can return to work. If you work in a dusty environment, you may be advised to wear protective eyewear for a period of time. °General instructions  °· Take or apply over-the-counter and prescription medicines only as told by your health care provider. This includes eye drops. °· Do not touch or rub your eyes. °· If you were given a protective shield, wear it as told by your health care provider. If you were not given a protective shield, wear sunglasses as told by your health care provider to protect your eyes. °· Keep the area around your eye clean and dry. Avoid swimming or allowing water to hit you directly in the face while showering until told by your health care provider. Keep soap and shampoo out of your eyes. °· Do not put a contact lens  into the affected eye or eyes until your health care provider approves. °· Keep all follow-up visits as told by your health care provider. This is important. °Contact a health care provider if: ° °· You have increased bruising around your eye. °· You have pain that is not helped with medicine. °· You have a fever. °· You have redness, swelling, or pain in your eye. °· You have fluid, blood, or pus coming from your incision. °· Your vision gets worse. °Get help right away if: °· You have sudden vision loss. °This information is not intended to replace advice given to you by your health care provider. Make sure you discuss any questions you have with your health care provider. °Document Released: 03/30/2005 Document Revised: 01/19/2016 Document Reviewed: 07/21/2015 °Elsevier Interactive Patient Education © 2017 [employer] ° ° ° ° °General Anesthesia, Adult, Care After °These instructions provide you with information about caring for yourself after your procedure. Your health care provider may also give you more specific instructions. Your treatment has been planned according to current medical practices, but problems sometimes occur. Call your health care provider if you have any problems or questions after your procedure. °What can I expect after the procedure? °After the procedure, it is common to have: °· Vomiting. °· A sore throat. °· Mental slowness. °It is common to feel: °· Nauseous. °· Cold or shivery. °· Sleepy. °· Tired. °· Sore or achy, even in parts of your body where you did not have surgery. °Follow these instructions at   home: °For at least 24 hours after the procedure:  °· Do not: °¨ Participate in activities where you could fall or become injured. °¨ Drive. °¨ Use heavy machinery. °¨ Drink alcohol. °¨ Take sleeping pills or medicines that cause drowsiness. °¨ Make important decisions or sign legal documents. °¨ Take care of children on your own. °· Rest. °Eating and drinking  °· If you vomit, drink  water, juice, or soup when you can drink without vomiting. °· Drink enough fluid to keep your urine clear or pale yellow. °· Make sure you have little or no nausea before eating solid foods. °· Follow the diet recommended by your health care provider. °General instructions  °· Have a responsible adult stay with you until you are awake and alert. °· Return to your normal activities as told by your health care provider. Ask your health care provider what activities are safe for you. °· Take over-the-counter and prescription medicines only as told by your health care provider. °· If you smoke, do not smoke without supervision. °· Keep all follow-up visits as told by your health care provider. This is important. °Contact a health care provider if: °· You continue to have nausea or vomiting at home, and medicines are not helpful. °· You cannot drink fluids or start eating again. °· You cannot urinate after 8-12 hours. °· You develop a skin rash. °· You have fever. °· You have increasing redness at the site of your procedure. °Get help right away if: °· You have difficulty breathing. °· You have chest pain. °· You have unexpected bleeding. °· You feel that you are having a life-threatening or urgent problem. °This information is not intended to replace advice given to you by your health care provider. Make sure you discuss any questions you have with your health care provider. °Document Released: 12/17/2000 Document Revised: 02/13/2016 Document Reviewed: 08/25/2015 °Elsevier Interactive Patient Education © 2017 [employer] Inc[employer] ° °

## 2017-01-16 ENCOUNTER — Ambulatory Visit: Payer: BLUE CROSS/BLUE SHIELD | Admitting: Anesthesiology

## 2017-01-16 ENCOUNTER — Ambulatory Visit
Admission: RE | Admit: 2017-01-16 | Discharge: 2017-01-16 | Disposition: A | Discharge status: Home / Self Care | Payer: BLUE CROSS/BLUE SHIELD | Source: Ambulatory Visit | Attending: Ophthalmology | Admitting: Ophthalmology

## 2017-01-16 ENCOUNTER — Encounter
Admission: RE | Disposition: A | Discharge status: Home / Self Care | Payer: Self-pay | Source: Ambulatory Visit | Attending: Ophthalmology

## 2017-01-16 DIAGNOSIS — E1036 Type 1 diabetes mellitus with diabetic cataract: Secondary | ICD-10-CM | POA: Diagnosis present

## 2017-01-16 DIAGNOSIS — Z794 Long term (current) use of insulin: Secondary | ICD-10-CM | POA: Insufficient documentation

## 2017-01-16 HISTORY — DX: Type 2 diabetes mellitus without complications: E11.9

## 2017-01-16 HISTORY — DX: Unspecified osteoarthritis, unspecified site: M19.90

## 2017-01-16 HISTORY — PX: CATARACT EXTRACTION W/PHACO: SHX586

## 2017-01-16 HISTORY — DX: Presence of dental prosthetic device (complete) (partial): Z97.2

## 2017-01-16 LAB — GLUCOSE, CAPILLARY
GLUCOSE-CAPILLARY: 198 mg/dL — AB (ref 65–99)
Glucose-Capillary: 212 mg/dL — ABNORMAL HIGH (ref 65–99)

## 2017-01-16 SURGERY — PHACOEMULSIFICATION, CATARACT, WITH IOL INSERTION
Anesthesia: Monitor Anesthesia Care | Site: Eye | Laterality: Right | Wound class: Clean

## 2017-01-16 MED ORDER — EPINEPHRINE PF 1 MG/ML IJ SOLN
INTRAOCULAR | Status: DC | PRN
  Administered 2017-01-16: 89 mL via OPHTHALMIC

## 2017-01-16 MED ORDER — MOXIFLOXACIN HCL 0.5 % OP SOLN
1.0000 [drp] | OPHTHALMIC | Status: DC | PRN
  Administered 2017-01-16 (×3): 1 [drp] via OPHTHALMIC

## 2017-01-16 MED ORDER — BRIMONIDINE TARTRATE-TIMOLOL 0.2-0.5 % OP SOLN
OPHTHALMIC | Status: DC | PRN
  Administered 2017-01-16: 1 [drp] via OPHTHALMIC

## 2017-01-16 MED ORDER — TRYPAN BLUE 0.06 % OP SOLN
OPHTHALMIC | Status: DC | PRN
  Administered 2017-01-16: 0.5 mL via INTRAOCULAR

## 2017-01-16 MED ORDER — MIDAZOLAM HCL 2 MG/2ML IJ SOLN
INTRAMUSCULAR | Status: DC | PRN
  Administered 2017-01-16: 2 mg via INTRAVENOUS

## 2017-01-16 MED ORDER — LACTATED RINGERS IV SOLN: INTRAVENOUS | Status: DC

## 2017-01-16 MED ORDER — ALFENTANIL 500 MCG/ML IJ INJ
INJECTION | INTRAVENOUS | Status: DC | PRN
  Administered 2017-01-16: 1000 ug via INTRAVENOUS

## 2017-01-16 MED ORDER — NEOMYCIN-POLYMYXIN-DEXAMETH 3.5-10000-0.1 OP OINT
TOPICAL_OINTMENT | OPHTHALMIC | Status: DC | PRN
  Administered 2017-01-16: 1 via OPHTHALMIC

## 2017-01-16 MED ORDER — CEFUROXIME OPHTHALMIC INJECTION 1 MG/0.1 ML
INJECTION | OPHTHALMIC | Status: DC | PRN
  Administered 2017-01-16: .3 mL via OPHTHALMIC

## 2017-01-16 MED ORDER — SODIUM HYALURONATE 23 MG/ML IO SOLN
INTRAOCULAR | Status: DC | PRN
  Administered 2017-01-16: 0.6 mL via INTRAOCULAR

## 2017-01-16 MED ORDER — NA HYALUR & NA CHOND-NA HYALUR 0.4-0.35 ML IO KIT
PACK | INTRAOCULAR | Status: DC | PRN
  Administered 2017-01-16: 1 mL via INTRAOCULAR

## 2017-01-16 MED ORDER — LIDOCAINE HCL (PF) 2 % IJ SOLN
INTRAMUSCULAR | Status: DC | PRN
  Administered 2017-01-16: 2 mL

## 2017-01-16 MED ORDER — BUPIVACAINE HCL (PF) 0.75 % IJ SOLN
INTRAMUSCULAR | Status: DC | PRN
  Administered 2017-01-16: 2 mL

## 2017-01-16 MED ORDER — ARMC OPHTHALMIC DILATING DROPS
1.0000 "application " | OPHTHALMIC | Status: DC | PRN
  Administered 2017-01-16 (×3): 1 via OPHTHALMIC

## 2017-01-16 SURGICAL SUPPLY — 25 items
CANNULA ANT/CHMB 27GA (MISCELLANEOUS) ×3 IMPLANT
CARTRIDGE ABBOTT (MISCELLANEOUS) IMPLANT
GLOVE SURG LX 7.5 STRW (GLOVE) ×2
GLOVE SURG LX STRL 7.5 STRW (GLOVE) ×1 IMPLANT
GLOVE SURG TRIUMPH 8.0 PF LTX (GLOVE) ×3 IMPLANT
GOWN STRL REUS W/ TWL LRG LVL3 (GOWN DISPOSABLE) ×2 IMPLANT
GOWN STRL REUS W/TWL LRG LVL3 (GOWN DISPOSABLE) ×4
LENS IOL TECNIS ITEC 19.0 (Intraocular Lens) ×3 IMPLANT
MARKER SKIN DUAL TIP RULER LAB (MISCELLANEOUS) ×3 IMPLANT
NDL RETROBULBAR .5 NSTRL (NEEDLE) IMPLANT
NEEDLE FILTER BLUNT 18X 1/2SAF (NEEDLE) ×2
NEEDLE FILTER BLUNT 18X1 1/2 (NEEDLE) ×1 IMPLANT
PACK CATARACT BRASINGTON (MISCELLANEOUS) ×3 IMPLANT
PACK EYE AFTER SURG (MISCELLANEOUS) ×3 IMPLANT
PACK OPTHALMIC (MISCELLANEOUS) ×3 IMPLANT
RING MALYGIN 7.0 (MISCELLANEOUS) IMPLANT
SUT ETHILON 10-0 CS-B-6CS-B-6 (SUTURE)
SUT VICRYL  9 0 (SUTURE)
SUT VICRYL 9 0 (SUTURE) IMPLANT
SUTURE EHLN 10-0 CS-B-6CS-B-6 (SUTURE) IMPLANT
SYR 3ML LL SCALE MARK (SYRINGE) ×3 IMPLANT
SYR 5ML LL (SYRINGE) ×3 IMPLANT
SYR TB 1ML LUER SLIP (SYRINGE) ×3 IMPLANT
WATER STERILE IRR 250ML POUR (IV SOLUTION) ×3 IMPLANT
WIPE NON LINTING 3.25X3.25 (MISCELLANEOUS) ×3 IMPLANT

## 2017-01-16 NOTE — Transfer of Care (Signed)
Immediate Anesthesia Transfer of Care Note  Patient: Tyler [name]  Procedure(s) Performed: Procedure(s) with comments: CATARACT EXTRACTION PHACO AND INTRAOCULAR LENS PLACEMENT (IOC)  Right Complicated (Right) - IVA Block Healon 5 malyugin vision blue Diabetic - oral meds  Patient Location: PACU  Anesthesia Type: MAC  Level of Consciousness: awake, alert  and patient cooperative  Airway and Oxygen Therapy: Patient Spontanous Breathing and Patient connected to supplemental oxygen  Post-op Assessment: Post-op Vital signs reviewed, Patient's Cardiovascular Status Stable, Respiratory Function Stable, Patent Airway and No signs of Nausea or vomiting  Post-op Vital Signs: Reviewed and stable  Complications: No apparent anesthesia complications

## 2017-01-16 NOTE — Anesthesia Procedure Notes (Signed)
Procedure Name: MAC Performed by: Mayme Genta Pre-anesthesia Checklist: Patient identified, Emergency Drugs available, Suction available, Timeout performed and Patient being monitored Patient Re-evaluated:Patient Re-evaluated prior to inductionOxygen Delivery Method: Nasal cannula Placement Confirmation: positive ETCO2

## 2017-01-16 NOTE — H&P (Signed)
The History and Physical notes are on paper, have been signed, and are to be scanned. The patient remains stable and unchanged from the H&P.   Previous H&P reviewed, patient examined, and there are no changes.  Lynnex Fulp 01/16/2017 8:52 AM

## 2017-01-16 NOTE — Op Note (Signed)
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  Vision Blue dye was used to stain the lens capsule.  LENS:  PCB00 19.0 D PCIOL    ULTRASOUND TIME: 18 of 3 minutes 10 seconds, CDE 34.9  SURGEON:  Deirdre Evener, MD   ANESTHESIA:  Retrobulbar block of Xylocaine and Bupivacaine   COMPLICATIONS:  None.   DESCRIPTION OF PROCEDURE:  The patient was identified in the holding room and transported to the operating suite and placed in the supine position. The right eye was identified as the operative eye, and a retrobulbar block was administered under inravenous sedation.  It was then prepped and draped in the usual sterile ophthalmic fashion.   A 1 millimeter clear-corneal paracentesis was made at the 12:00 position.  0.5 ml of preservative-free 1% lidocaine was injected into the anterior chamber. The anterior chamber was filled with Healon 5 viscoelastic.  A 2.4 millimeter keratome was used to make a near-clear corneal incision at the 9:00 position.  The anterior chamber was filled with Healon 5 viscoelastic.  Vision Blue dye was then injected under the viscoelastic to stain the lens capsule.  BSS was then used to wash the dye out.  Additional Healon 5 was placed into the anterior chamber.  A curvilinear capsulorrhexis was made with a cystotome and capsulorrhexis forceps.  Balanced salt solution was used to hydrodissect and hydrodelineate the nucleus.  Viscoat was then placed in the anterior chamber.   Phacoemulsification was then used in stop and chop fashion to remove the lens nucleus and epinucleus.  The remaining cortex was then removed using the irrigation and aspiration handpiece. Provisc  was then placed into the capsular bag to distend it for lens placement.  A 19.0 -diopter lens was then injected into the capsular bag.  The remaining viscoelastic was aspirated.   Wounds were hydrated with balanced salt solution.  The anterior chamber was inflated to a physiologic pressure with balanced salt solution. Cefuroxime 0.1 ml of a /ml solution was injected into the anterior chamber for a dose of 1 mg of intracameral antibiotic at the completion of the case. Miostat was placed into the anterior chamber to constrict the pupil.  No wound leaks were noted.  Topical Combigan drops and Maxitrol ointment were applied to the eye.  The patient was taken to the recovery room in stable condition without complications of anesthesia or surgery.  [name] 01/16/2017, 9:51 AM

## 2017-01-16 NOTE — Anesthesia Postprocedure Evaluation (Signed)
Anesthesia Post Note  Patient: Tyler [name]  Procedure(s) Performed: Procedure(s) (LRB): CATARACT EXTRACTION PHACO AND INTRAOCULAR LENS PLACEMENT (IOC)  Right Complicated (Right)  Patient location during evaluation: PACU Anesthesia Type: MAC Level of consciousness: awake Pain management: pain level controlled Vital Signs Assessment: post-procedure vital signs reviewed and stable Respiratory status: spontaneous breathing Cardiovascular status: blood pressure returned to baseline Postop Assessment: no headache Anesthetic complications: no    Jaci Standard, III,  Zori Benbrook D

## 2017-01-16 NOTE — Anesthesia Preprocedure Evaluation (Signed)
Anesthesia Evaluation  Patient identified by MRN, date of birth, ID band Patient awake    Reviewed: Allergy & Precautions, H&P , NPO status , Patient's Chart, lab work & pertinent test results  Airway Mallampati: I  TM Distance: >3 FB Neck ROM: full    Dental  (+) Edentulous Upper   Pulmonary neg pulmonary ROS,    Pulmonary exam normal        Cardiovascular negative cardio ROS Normal cardiovascular exam     Neuro/Psych    GI/Hepatic negative GI ROS, Neg liver ROS,   Endo/Other  diabetes, Well Controlled, Type 1, Insulin Dependent  Renal/GU negative Renal ROS     Musculoskeletal   Abdominal   Peds  Hematology negative hematology ROS (+)   Anesthesia Other Findings   Reproductive/Obstetrics                             Anesthesia Physical Anesthesia Plan  ASA: II  Anesthesia Plan: MAC   Post-op Pain Management:    Induction:   Airway Management Planned:   Additional Equipment:   Intra-op Plan:   Post-operative Plan:   Informed Consent: I have reviewed the patients History and Physical, chart, labs and discussed the procedure including the risks, benefits and alternatives for the proposed anesthesia with the patient or authorized representative who has indicated his/her understanding and acceptance.     Plan Discussed with:   Anesthesia Plan Comments:         Anesthesia Quick Evaluation

## 2017-01-17 ENCOUNTER — Encounter: Payer: Self-pay | Admitting: Ophthalmology

## 2017-01-23 ENCOUNTER — Encounter: Payer: Self-pay | Admitting: Ophthalmology

## 2018-07-03 ENCOUNTER — Inpatient Hospital Stay: Payer: BLUE CROSS/BLUE SHIELD

## 2018-07-03 ENCOUNTER — Emergency Department: Payer: BLUE CROSS/BLUE SHIELD

## 2018-07-03 ENCOUNTER — Other Ambulatory Visit: Payer: Self-pay

## 2018-07-03 ENCOUNTER — Inpatient Hospital Stay
Admission: EM | Admit: 2018-07-03 | Discharge: 2018-07-08 | DRG: 853 | Disposition: A | Discharge status: Home Health | Payer: BLUE CROSS/BLUE SHIELD | Attending: Internal Medicine | Admitting: Internal Medicine

## 2018-07-03 DIAGNOSIS — Z9841 Cataract extraction status, right eye: Secondary | ICD-10-CM

## 2018-07-03 DIAGNOSIS — N179 Acute kidney failure, unspecified: Secondary | ICD-10-CM | POA: Diagnosis present

## 2018-07-03 DIAGNOSIS — E1165 Type 2 diabetes mellitus with hyperglycemia: Secondary | ICD-10-CM | POA: Diagnosis present

## 2018-07-03 DIAGNOSIS — M1990 Unspecified osteoarthritis, unspecified site: Secondary | ICD-10-CM | POA: Diagnosis present

## 2018-07-03 DIAGNOSIS — Z7984 Long term (current) use of oral hypoglycemic drugs: Secondary | ICD-10-CM | POA: Diagnosis not present

## 2018-07-03 DIAGNOSIS — E1151 Type 2 diabetes mellitus with diabetic peripheral angiopathy without gangrene: Secondary | ICD-10-CM | POA: Diagnosis present

## 2018-07-03 DIAGNOSIS — N390 Urinary tract infection, site not specified: Secondary | ICD-10-CM | POA: Diagnosis present

## 2018-07-03 DIAGNOSIS — E872 Acidosis, unspecified: Secondary | ICD-10-CM

## 2018-07-03 DIAGNOSIS — G9341 Metabolic encephalopathy: Secondary | ICD-10-CM | POA: Diagnosis present

## 2018-07-03 DIAGNOSIS — E1169 Type 2 diabetes mellitus with other specified complication: Secondary | ICD-10-CM | POA: Diagnosis present

## 2018-07-03 DIAGNOSIS — Z961 Presence of intraocular lens: Secondary | ICD-10-CM | POA: Diagnosis present

## 2018-07-03 DIAGNOSIS — E11621 Type 2 diabetes mellitus with foot ulcer: Secondary | ICD-10-CM | POA: Diagnosis present

## 2018-07-03 DIAGNOSIS — R6510 Systemic inflammatory response syndrome (SIRS) of non-infectious origin without acute organ dysfunction: Secondary | ICD-10-CM

## 2018-07-03 DIAGNOSIS — M869 Osteomyelitis, unspecified: Secondary | ICD-10-CM | POA: Diagnosis present

## 2018-07-03 DIAGNOSIS — L97529 Non-pressure chronic ulcer of other part of left foot with unspecified severity: Secondary | ICD-10-CM | POA: Diagnosis present

## 2018-07-03 DIAGNOSIS — B9561 Methicillin susceptible Staphylococcus aureus infection as the cause of diseases classified elsewhere: Secondary | ICD-10-CM | POA: Diagnosis present

## 2018-07-03 DIAGNOSIS — I4510 Unspecified right bundle-branch block: Secondary | ICD-10-CM | POA: Diagnosis present

## 2018-07-03 DIAGNOSIS — R6520 Severe sepsis without septic shock: Secondary | ICD-10-CM | POA: Diagnosis present

## 2018-07-03 DIAGNOSIS — A409 Streptococcal sepsis, unspecified: Secondary | ICD-10-CM | POA: Diagnosis present

## 2018-07-03 DIAGNOSIS — I160 Hypertensive urgency: Secondary | ICD-10-CM | POA: Diagnosis present

## 2018-07-03 DIAGNOSIS — A419 Sepsis, unspecified organism: Secondary | ICD-10-CM | POA: Diagnosis present

## 2018-07-03 DIAGNOSIS — L03116 Cellulitis of left lower limb: Secondary | ICD-10-CM | POA: Diagnosis present

## 2018-07-03 LAB — CBC WITH DIFFERENTIAL/PLATELET
ABS IMMATURE GRANULOCYTES: 0.07 10*3/uL (ref 0.00–0.07)
Basophils Absolute: 0 10*3/uL (ref 0.0–0.1)
Basophils Relative: 0 %
EOS ABS: 0.1 10*3/uL (ref 0.0–0.5)
Eosinophils Relative: 0 %
HEMATOCRIT: 38.3 % — AB (ref 39.0–52.0)
Hemoglobin: 12.6 g/dL — ABNORMAL LOW (ref 13.0–17.0)
IMMATURE GRANULOCYTES: 1 %
LYMPHS ABS: 0.4 10*3/uL — AB (ref 0.7–4.0)
Lymphocytes Relative: 3 %
MCH: 28.8 pg (ref 26.0–34.0)
MCHC: 32.9 g/dL (ref 30.0–36.0)
MCV: 87.4 fL (ref 80.0–100.0)
MONO ABS: 0.6 10*3/uL (ref 0.1–1.0)
MONOS PCT: 5 %
NEUTROS ABS: 12.7 10*3/uL — AB (ref 1.7–7.7)
NEUTROS PCT: 91 %
Platelets: 189 10*3/uL (ref 150–400)
RBC: 4.38 MIL/uL (ref 4.22–5.81)
RDW: 15.3 % (ref 11.5–15.5)
WBC: 13.9 10*3/uL — ABNORMAL HIGH (ref 4.0–10.5)
nRBC: 0 % (ref 0.0–0.2)

## 2018-07-03 LAB — URINALYSIS, COMPLETE (UACMP) WITH MICROSCOPIC
BACTERIA UA: NONE SEEN
BILIRUBIN URINE: NEGATIVE
Glucose, UA: >=500 mg/dL — AB
Ketones, ur: 5 mg/dL — AB
Leukocytes, UA: NEGATIVE
NITRITE: NEGATIVE
PH: 5 (ref 5.0–8.0)
Protein, ur: 30 mg/dL — AB
SPECIFIC GRAVITY, URINE: 1.016 (ref 1.005–1.030)

## 2018-07-03 LAB — BLOOD CULTURE ID PANEL (REFLEXED)
Acinetobacter baumannii: NOT DETECTED
CANDIDA GLABRATA: NOT DETECTED
CANDIDA TROPICALIS: NOT DETECTED
Candida albicans: NOT DETECTED
Candida krusei: NOT DETECTED
Candida parapsilosis: NOT DETECTED
ENTEROBACTER CLOACAE COMPLEX: NOT DETECTED
Enterobacteriaceae species: NOT DETECTED
Enterococcus species: NOT DETECTED
Escherichia coli: NOT DETECTED
Haemophilus influenzae: NOT DETECTED
KLEBSIELLA PNEUMONIAE: NOT DETECTED
Klebsiella oxytoca: NOT DETECTED
Listeria monocytogenes: NOT DETECTED
NEISSERIA MENINGITIDIS: NOT DETECTED
PROTEUS SPECIES: NOT DETECTED
Pseudomonas aeruginosa: NOT DETECTED
STAPHYLOCOCCUS SPECIES: NOT DETECTED
STREPTOCOCCUS AGALACTIAE: NOT DETECTED
Serratia marcescens: NOT DETECTED
Staphylococcus aureus (BCID): NOT DETECTED
Streptococcus pneumoniae: NOT DETECTED
Streptococcus pyogenes: NOT DETECTED
Streptococcus species: DETECTED — AB

## 2018-07-03 LAB — COMPREHENSIVE METABOLIC PANEL
ALBUMIN: 3.5 g/dL (ref 3.5–5.0)
ALT: 51 U/L — AB (ref 0–44)
AST: 54 U/L — AB (ref 15–41)
Alkaline Phosphatase: 140 U/L — ABNORMAL HIGH (ref 38–126)
Anion gap: 12 (ref 5–15)
BILIRUBIN TOTAL: 1.2 mg/dL (ref 0.3–1.2)
BUN: 29 mg/dL — AB (ref 8–23)
CO2: 18 mmol/L — ABNORMAL LOW (ref 22–32)
CREATININE: 1.56 mg/dL — AB (ref 0.61–1.24)
Calcium: 8.8 mg/dL — ABNORMAL LOW (ref 8.9–10.3)
Chloride: 104 mmol/L (ref 98–111)
GFR calc Af Amer: 49 mL/min — ABNORMAL LOW (ref >60)
GFR, EST NON AFRICAN AMERICAN: 42 mL/min — AB (ref >60)
GLUCOSE: 335 mg/dL — AB (ref 70–99)
POTASSIUM: 5 mmol/L (ref 3.5–5.1)
Sodium: 134 mmol/L — ABNORMAL LOW (ref 135–145)
TOTAL PROTEIN: 8.1 g/dL (ref 6.5–8.1)

## 2018-07-03 LAB — TROPONIN I: Troponin I: 0.05 ng/mL (ref <0.03)

## 2018-07-03 LAB — LACTIC ACID, PLASMA
Lactic Acid, Venous: 4.4 mmol/L (ref 0.5–1.9)
Lactic Acid, Venous: 3.6 mmol/L (ref 0.5–1.9)

## 2018-07-03 LAB — GLUCOSE, CAPILLARY
GLUCOSE-CAPILLARY: 191 mg/dL — AB (ref 70–99)
GLUCOSE-CAPILLARY: 161 mg/dL — AB (ref 70–99)
Glucose-Capillary: 314 mg/dL — ABNORMAL HIGH (ref 70–99)

## 2018-07-03 LAB — PROCALCITONIN: Procalcitonin: 5.26 ng/mL

## 2018-07-03 MED ORDER — SODIUM CHLORIDE 0.9 % IV SOLN
INTRAVENOUS | Status: DC | PRN
  Administered 2018-07-03 (×2): via INTRAVENOUS

## 2018-07-03 MED ORDER — ACETAMINOPHEN 650 MG RE SUPP
650.0000 mg | Freq: Four times a day (QID) | RECTAL | Status: DC | PRN
  Administered 2018-07-03: 650 mg via RECTAL
  Filled 2018-07-03: qty 1

## 2018-07-03 MED ORDER — SODIUM CHLORIDE 0.9 % IV SOLN
Freq: Once | INTRAVENOUS | Status: AC
  Administered 2018-07-03: 11:00:00 via INTRAVENOUS

## 2018-07-03 MED ORDER — METRONIDAZOLE IN NACL 5-0.79 MG/ML-% IV SOLN
500.0000 mg | Freq: Three times a day (TID) | INTRAVENOUS | Status: DC
  Administered 2018-07-03 – 2018-07-07 (×12): 500 mg via INTRAVENOUS
  Filled 2018-07-03 (×14): qty 100

## 2018-07-03 MED ORDER — ACETAMINOPHEN 325 MG PO TABS
650.0000 mg | ORAL_TABLET | Freq: Four times a day (QID) | ORAL | Status: DC | PRN
  Filled 2018-07-03: qty 2

## 2018-07-03 MED ORDER — ONDANSETRON HCL 4 MG/2ML IJ SOLN: 4.0000 mg | Freq: Four times a day (QID) | INTRAMUSCULAR | Status: DC | PRN

## 2018-07-03 MED ORDER — LORAZEPAM 2 MG/ML IJ SOLN
1.0000 mg | Freq: Once | INTRAMUSCULAR | Status: AC
  Administered 2018-07-04: 1 mg via INTRAVENOUS
  Filled 2018-07-03: qty 1

## 2018-07-03 MED ORDER — SODIUM CHLORIDE 0.9 % IV SOLN: Freq: Once | INTRAVENOUS | Status: DC

## 2018-07-03 MED ORDER — ADULT MULTIVITAMIN W/MINERALS CH
1.0000 | ORAL_TABLET | Freq: Every day | ORAL | Status: DC
  Administered 2018-07-03 – 2018-07-08 (×5): 1 via ORAL
  Filled 2018-07-03 (×6): qty 1

## 2018-07-03 MED ORDER — GLIPIZIDE 10 MG PO TABS
10.0000 mg | ORAL_TABLET | Freq: Two times a day (BID) | ORAL | Status: DC
  Administered 2018-07-03 – 2018-07-08 (×10): 10 mg via ORAL
  Filled 2018-07-03 (×12): qty 1

## 2018-07-03 MED ORDER — SODIUM CHLORIDE 0.9 % IV SOLN: 2.0000 g | Freq: Once | INTRAVENOUS | Status: DC

## 2018-07-03 MED ORDER — HEPARIN SODIUM (PORCINE) 5000 UNIT/ML IJ SOLN
5000.0000 [IU] | Freq: Three times a day (TID) | INTRAMUSCULAR | Status: DC
  Administered 2018-07-03 – 2018-07-08 (×14): 5000 [IU] via SUBCUTANEOUS
  Filled 2018-07-03 (×14): qty 1

## 2018-07-03 MED ORDER — PIPERACILLIN-TAZOBACTAM 3.375 G IVPB 30 MIN
3.3750 g | Freq: Once | INTRAVENOUS | Status: AC
  Administered 2018-07-03: 3.375 g via INTRAVENOUS
  Filled 2018-07-03: qty 50

## 2018-07-03 MED ORDER — HYDRALAZINE HCL 20 MG/ML IJ SOLN
10.0000 mg | Freq: Four times a day (QID) | INTRAMUSCULAR | Status: DC | PRN
  Administered 2018-07-03 – 2018-07-07 (×4): 10 mg via INTRAVENOUS
  Filled 2018-07-03 (×4): qty 1

## 2018-07-03 MED ORDER — VANCOMYCIN HCL 10 G IV SOLR
1250.0000 mg | Freq: Two times a day (BID) | INTRAVENOUS | Status: DC
  Administered 2018-07-03 – 2018-07-04 (×2): 1250 mg via INTRAVENOUS
  Filled 2018-07-03 (×3): qty 1250

## 2018-07-03 MED ORDER — METOPROLOL TARTRATE 5 MG/5ML IV SOLN
5.0000 mg | Freq: Four times a day (QID) | INTRAVENOUS | Status: DC | PRN
  Administered 2018-07-03 – 2018-07-07 (×2): 5 mg via INTRAVENOUS
  Filled 2018-07-03 (×2): qty 5

## 2018-07-03 MED ORDER — LORAZEPAM BOLUS VIA INFUSION: 1.0000 mg | Freq: Once | INTRAVENOUS | Status: DC

## 2018-07-03 MED ORDER — VANCOMYCIN HCL IN DEXTROSE 1-5 GM/200ML-% IV SOLN
1000.0000 mg | Freq: Once | INTRAVENOUS | Status: AC
  Administered 2018-07-03: 1000 mg via INTRAVENOUS
  Filled 2018-07-03: qty 200

## 2018-07-03 MED ORDER — SODIUM CHLORIDE 0.9 % IV SOLN
2000.0000 mL | Freq: Once | INTRAVENOUS | Status: AC
  Administered 2018-07-03: 2000 mL via INTRAVENOUS

## 2018-07-03 MED ORDER — LABETALOL HCL 5 MG/ML IV SOLN
10.0000 mg | Freq: Once | INTRAVENOUS | Status: DC
  Filled 2018-07-03: qty 4

## 2018-07-03 MED ORDER — GADOBUTROL 1 MMOL/ML IV SOLN
10.0000 mL | Freq: Once | INTRAVENOUS | Status: AC | PRN
  Administered 2018-07-04: 10 mL via INTRAVENOUS

## 2018-07-03 MED ORDER — INSULIN ASPART 100 UNIT/ML ~~LOC~~ SOLN: 0.0000 [IU] | Freq: Every day | SUBCUTANEOUS | Status: DC

## 2018-07-03 MED ORDER — METOPROLOL TARTRATE 5 MG/5ML IV SOLN
INTRAVENOUS | Status: AC
  Filled 2018-07-03: qty 5

## 2018-07-03 MED ORDER — VANCOMYCIN HCL IN DEXTROSE 1-5 GM/200ML-% IV SOLN: 1000.0000 mg | Freq: Once | INTRAVENOUS | Status: DC

## 2018-07-03 MED ORDER — SODIUM CHLORIDE 0.9 % IV SOLN
2.0000 g | Freq: Two times a day (BID) | INTRAVENOUS | Status: DC
  Administered 2018-07-03 – 2018-07-06 (×7): 2 g via INTRAVENOUS
  Filled 2018-07-03 (×11): qty 2

## 2018-07-03 MED ORDER — DOCUSATE SODIUM 100 MG PO CAPS
100.0000 mg | ORAL_CAPSULE | Freq: Two times a day (BID) | ORAL | Status: DC
  Administered 2018-07-03 – 2018-07-08 (×8): 100 mg via ORAL
  Filled 2018-07-03 (×8): qty 1

## 2018-07-03 MED ORDER — ONDANSETRON HCL 4 MG PO TABS: 4.0000 mg | ORAL_TABLET | Freq: Four times a day (QID) | ORAL | Status: DC | PRN

## 2018-07-03 MED ORDER — INSULIN ASPART 100 UNIT/ML ~~LOC~~ SOLN
0.0000 [IU] | Freq: Three times a day (TID) | SUBCUTANEOUS | Status: DC
  Administered 2018-07-03: 7 [IU] via SUBCUTANEOUS
  Administered 2018-07-03: 2 [IU] via SUBCUTANEOUS
  Administered 2018-07-04 – 2018-07-08 (×5): 1 [IU] via SUBCUTANEOUS
  Filled 2018-07-03 (×7): qty 1

## 2018-07-03 NOTE — Progress Notes (Signed)
Talked to Dr. Caryn Bee about patient's restless after MRI of his foot. Temperature was 103.1, BP 185/83, SpO2 was 94% in room air, HR sustaining to 130's and patient is tachypneic at 38. Patient did not complete MRI because of restlessness. RN will give tylenol for fever, and order given for Metoprolol IV 5 mg for HR and BP. Patient is  In IVF at 100 ml/hr. Blood culture was drawn this afternoon. Will recheck BP after medication is given.    Order given for IV Ativan 1 mg when patient is able to complete MRI of his foot. RN will continue to monitor.

## 2018-07-03 NOTE — Progress Notes (Signed)
Talked to Dr. Imogene Burn about patient's BP of 183/92 initially then recheck was 170/81, patient does not have BP medication. Order for IV Hydralazine 10 mg, PRN given. RN will continue to monitor.

## 2018-07-03 NOTE — Consult Note (Signed)
Pharmacy Antibiotic Note  Tyler [name] is a 72 y.o. male admitted on 07/03/2018 with cellulitis.  PMH includes arthritis, PVD, and DM. He is s/p amputation of the left great toe. X-ray of that toe not conclusive to rule out osteomyelitis. Lactic acid is elevated at 4.4, PCT pending. Pharmacy has been consulted for vancomycin and cefepime dosing. There is no previous h/o vancomycin dosing to guide therapy.  Plan: 1) Vancomycin 1250mg  IV every 12 hours beginning 6 hours after 1st dose of 1000mg .   K 0.054 Vd: 70L T1/2: 12.8h Goal trough 15-20 mcg/mL. Vt prior to 4th dose  2) cefepime 2grams IV every 12 hours  Height: 6\' 6"  (198.1 cm) Weight: 220 lb (99.8 kg) IBW/kg (Calculated) : 91.4  Temp (24hrs), Avg:101.9 F (38.8 C), Min:101.1 F (38.4 C), Max:102.7 F (39.3 C)  Recent Labs  Lab 07/03/18 1027  WBC 13.9*  CREATININE 1.56*  LATICACIDVEN 4.4*    Estimated Creatinine Clearance: 54.5 mL/min (A) (by C-G formula based on SCr of 1.56 mg/dL (H)).    No Known Allergies  Antimicrobials this admission: Vancomycin  10/10 >>  Cefepime 10/10 >>  Zosyn 10/10 x1  Microbiology results: 10/10 BCx: pending 10/10 UCx: pending  Thank you for allowing pharmacy to be a part of this patient's care.  Lowella Bandy, PharmD 07/03/2018 12:24 PM

## 2018-07-03 NOTE — ED Notes (Signed)
Report to Jessica RN.

## 2018-07-03 NOTE — Progress Notes (Signed)
CODE SEPSIS - PHARMACY COMMUNICATION  **Broad Spectrum Antibiotics should be administered within 1 hour of Sepsis diagnosis**  Time Code Sepsis Called/Page Received: 1017  Antibiotics Ordered: Zosyn/Vancomycin  Time of 1st antibiotic administration: 1053 Zosyn   Additional action taken by pharmacy: none indicated   If necessary, Name of Provider/Nurse Contacted: N/A    Rutilio Yellowhair L ,PharmD Clinical Pharmacist  07/03/2018  10:55 AM

## 2018-07-03 NOTE — ED Notes (Signed)
XR in room 

## 2018-07-03 NOTE — H&P (Signed)
Tyler [name] at [facility] NAME: Tyler [name]    MR#:  [id number]  DATE OF BIRTH:  December 08, 1945  DATE OF ADMISSION:  07/03/2018  PRIMARY CARE PHYSICIAN: System, Pcp Not In   REQUESTING/REFERRING PHYSICIAN: Dr. Jimmye Norman  CHIEF COMPLAINT:   Fever HISTORY OF PRESENT ILLNESS:  Tyler [name]  is a 72 y.o. 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  According to the to the daughters at bedside patient is not feeling well and lactic acid is elevated at 4.4.  Patient was found to be tachycardic with heart rate around 130.  Temperature 102.7 WBC elevated at 13.9.  X-ray of the foot nonconclusive and recommending MRI.  Hospitalist team is called to admit the patient after blood cultures urine cultures are obtained and empiric IV antibiotics Zosyn and vancomycin were given  PAST MEDICAL HISTORY:   Past Medical History:  Diagnosis Date  . Arthritis   . Diabetes mellitus without complication ([place])   . Wears dentures    full upper    PAST SURGICAL HISTOIRY:   Past Surgical History:  Procedure Laterality Date  . CATARACT EXTRACTION W/PHACO Right 01/16/2017   Procedure: CATARACT EXTRACTION PHACO AND INTRAOCULAR LENS PLACEMENT ([place])  Right Complicated;  [name], MD;  Location: [facility];  Service: Ophthalmology;  Laterality: Right;  IVA Block Healon 5 malyugin vision blue Diabetic - oral meds  . TOE AMPUTATION Left   . TONSILLECTOMY      SOCIAL HISTORY:   Social History   Tobacco Use  . Smoking status: Never Smoker  . Smokeless tobacco: Never Used  Substance Use Topics  . Alcohol use: No    FAMILY HISTORY:  No family history on file.  DRUG ALLERGIES:  No Known Allergies  REVIEW OF SYSTEMS:  CONSTITUTIONAL: Reporting fever,  weakness.  EYES: No blurred or double vision.  EARS, NOSE, AND THROAT: No tinnitus or ear pain.  RESPIRATORY: No cough, shortness of breath, wheezing or hemoptysis.  CARDIOVASCULAR: No chest pain, orthopnea, edema.  GASTROINTESTINAL: No nausea, vomiting, diarrhea or abdominal pain.  GENITOURINARY: No dysuria, hematuria.  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   NEUROLOGIC: No tingling, numbness, weakness.  PSYCHIATRY: No anxiety or depression.   MEDICATIONS AT HOME:   Prior to Admission medications   Medication Sig Start Date End Date Taking? Authorizing Provider  glipiZIDE (GLUCOTROL) 10 MG tablet Take 10 mg by mouth 2 (two) times daily. 06/20/18  Yes [provider]  metFORMIN (GLUCOPHAGE) 1000 MG tablet Take 1,000 mg by mouth 2 (two) times daily. 06/17/18  Yes [provider]  Multiple Vitamin (MULTIVITAMIN) tablet Take 1 tablet by mouth daily.   Yes [provider]      VITAL SIGNS:  Blood pressure (!) 167/77, pulse (!) 127, temperature (!) 101.1 F (38.4 C), temperature source Oral, resp. rate (!) 33, height 6' 6"  (1.981 m), weight 99.8 kg, SpO2 97 %.  PHYSICAL EXAMINATION:  GENERAL:  72 y.o.-year-old patient lying in the bed with no acute distress.  EYES: Pupils equal, round, reactive to light and accommodation. No scleral icterus. Extraocular muscles intact.  HEENT: Head atraumatic, normocephalic. Oropharynx and nasopharynx clear.  NECK:  Supple, no jugular  venous distention. No thyroid enlargement, no tenderness.  LUNGS: Normal breath sounds bilaterally, no wheezing, rales,rhonchi or crepitation. No use of accessory muscles of respiration.  CARDIOVASCULAR: S1, S2 normal. No murmurs, rubs, or gallops.  ABDOMEN: Soft, nontender, nondistended. Bowel sounds present.  EXTREMITIES: Status post amputation of the  left great toe, with erythema and edema no pedal edema, cyanosis, or clubbing.  NEUROLOGIC: Cranial nerves II through XII are intact. Muscle strength 5/5 in all extremities. Sensation intact. Gait not checked.  PSYCHIATRIC: The patient is alert and oriented x 3.  SKIN: No obvious rash, lesion, or ulcer.   LABORATORY PANEL:   CBC Recent Labs  Lab 07/03/18 1027  WBC 13.9*  HGB 12.6*  HCT 38.3*  PLT 189   ------------------------------------------------------------------------------------------------------------------  Chemistries  Recent Labs  Lab 07/03/18 1027  NA 134*  K 5.0  CL 104  CO2 18*  GLUCOSE 335*  BUN 29*  CREATININE 1.56*  CALCIUM 8.8*  AST 54*  ALT 51*  ALKPHOS 140*  BILITOT 1.2   ------------------------------------------------------------------------------------------------------------------  Cardiac Enzymes Recent Labs  Lab 07/03/18 1027  TROPONINI 0.05*   ------------------------------------------------------------------------------------------------------------------  RADIOLOGY:  Dg Chest Port 1 View  Result Date: 07/03/2018 CLINICAL DATA:  Altered mental status, tachycardia. EXAM: PORTABLE CHEST 1 VIEW COMPARISON:  07/05/2014. FINDINGS: Patient is rotated. Trachea is midline. Heart is enlarged, as before. Lungs are low in volume but clear. No pleural fluid. IMPRESSION: No acute findings. Electronically Signed   By: Lorin Picket M.D.   On: 07/03/2018 11:18   Dg Foot Complete Left  Result Date: 07/03/2018 CLINICAL DATA:  Left foot pain. Diabetic ulcer involving the plantar aspect of the foot near the first and second MTP joints. History of prior amputation of the great toe. EXAM: LEFT FOOT - COMPLETE 3+ VIEW COMPARISON:  Radiographs 07/05/2014 FINDINGS: Prior amputation of the great toe. I do not see any obvious destructive bony changes involving the first metatarsal head to suggest osteomyelitis. 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 No definite findings for septic arthritis or osteomyelitis. The mid and hindfoot bony structures are intact. Small vessel calcifications are noted. Diffuse subcutaneous soft tissue swelling/edema suggesting cellulitis. IMPRESSION: No definite plain film findings for osteomyelitis or septic arthritis. MRI may be helpful for further evaluation. 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 Electronically Signed   By: Marijo Sanes M.D.   On: 07/03/2018 11:20    EKG:   Orders placed or performed during the hospital encounter of 07/03/18  . ED EKG 12-Lead  . ED EKG 12-Lead  . EKG 12-Lead  . EKG 12-Lead    IMPRESSION AND PLAN:   Kagen Kunath  is a 72 y.o. 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  According to the to the daughters at bedside patient is not feeling well and lactic acid is elevated at 4.4.  Patient was found to be tachycardic with heart rate around 130.  Temperature 102.7 WBC elevated at 13.9.  X-ray of the foot nonconclusive and recommending MRI  #Sepsis possibly from left great toe cellulitis rule out osteomyelitis Admit to [facility] Patient met septic criteria at the time of admission with leukocytosis, tachycardia, fever, elevated lactic  acid Blood cultures, urine cultures ordered.  Chest x-ray negative. X-ray of the foot-no definitive evidence of osteomyelitis and recommending MRI  Zosyn and vancomycin given in the ED.  Will provide cefepime, Flagyl and vancomycin Consider podiatry consult depending on MRI results  #Acute kidney injury IV fluids monitor renal function and avoid nephrotoxins  #Hypertensive  urgency Labetalol IV as needed Patient is not on any blood pressure medications will start metoprolol 50 mg p.o. twice daily and lisinopril 10 mg and titrate as needed  #Elevated troponin patient is a symptomatic could be demand ischemia Cycle troponins and monitor patient on telemetry  #Diabetes mellitus sliding scale insulin continue glipizide and hold metformin  All the records are reviewed and case discussed with ED provider. Management plans discussed with the patient, family and they are in agreement.  CODE STATUS: fc   TOTAL TIME TAKING CARE OF THIS PATIENT: 45  minutes.   Note: This dictation was prepared with Dragon dictation along with smaller phrase technology. Any transcriptional errors that result from this process are unintentional.  Nicholes Mango M.D on 07/03/2018 at 12:22 PM  Between 7am to 6pm - Pager - [phone]  After 6pm go to [email or web address] - password [email or web address] [facility]  Office  [phone]  CC: Primary care physician; System, Pcp Not In

## 2018-07-03 NOTE — Progress Notes (Signed)
MRI called and said that patient could not lay still for his MRI of his foot, paged MD if he can have something to calm him down. Awaiting for MD to call back.

## 2018-07-03 NOTE — ED Triage Notes (Signed)
Pt arrived via EMS from home with reports of altered mental status after patient did not show up for work this morning.   Pt had recent toe amputation unknown date, pt was found by EMS with elevated heart rate and temp and altered mental status.

## 2018-07-03 NOTE — Progress Notes (Signed)
PHARMACY - PHYSICIAN COMMUNICATION CRITICAL VALUE ALERT - BLOOD CULTURE IDENTIFICATION (BCID)  Tyler [name] is an 72 y.o. male who presented to [facility] on 07/03/2018 with a chief complaint of fever  Assessment:  Febrile (Tmax 102.31F), tachycardic, WBC 13.9, LA 4.4 >> 3.6, DG Foot s/p big toe amputation, no other osteomyelitis, CXR NG, 4/4 GPC BCID strep species  Name of physician (or Provider) Contacted: Cammy Copa  Current antibiotics: Vanc/cefepime  Changes to prescribed antibiotics recommended:  Recommendations declined by provider due to patient may be getting another amputation  Results for orders placed or performed during the hospital encounter of 07/03/18  Blood Culture ID Panel (Reflexed) (Collected: 07/03/2018 10:39 AM)  Result Value Ref Range   Enterococcus species NOT DETECTED NOT DETECTED   Listeria monocytogenes NOT DETECTED NOT DETECTED   Staphylococcus species NOT DETECTED NOT DETECTED   Staphylococcus aureus (BCID) NOT DETECTED NOT DETECTED   Streptococcus species DETECTED (A) NOT DETECTED   Streptococcus agalactiae NOT DETECTED NOT DETECTED   Streptococcus pneumoniae NOT DETECTED NOT DETECTED   Streptococcus pyogenes NOT DETECTED NOT DETECTED   Acinetobacter baumannii NOT DETECTED NOT DETECTED   Enterobacteriaceae species NOT DETECTED NOT DETECTED   Enterobacter cloacae complex NOT DETECTED NOT DETECTED   Escherichia coli NOT DETECTED NOT DETECTED   Klebsiella oxytoca NOT DETECTED NOT DETECTED   Klebsiella pneumoniae NOT DETECTED NOT DETECTED   Proteus species NOT DETECTED NOT DETECTED   Serratia marcescens NOT DETECTED NOT DETECTED   Haemophilus influenzae NOT DETECTED NOT DETECTED   Neisseria meningitidis NOT DETECTED NOT DETECTED   Pseudomonas aeruginosa NOT DETECTED NOT DETECTED   Candida albicans NOT DETECTED NOT DETECTED   Candida glabrata NOT DETECTED NOT DETECTED   Candida krusei NOT DETECTED NOT DETECTED   Candida parapsilosis NOT DETECTED NOT  DETECTED   Candida tropicalis NOT DETECTED NOT DETECTED   Thomasene Ripple, PharmD, BCPS Clinical Pharmacist 07/03/2018

## 2018-07-03 NOTE — ED Provider Notes (Signed)
[facility] Emergency Department Provider Note       Time seen: ----------------------------------------- 10:27 AM on 07/03/2018 -----------------------------------------   I have reviewed the triage vital signs and the nursing notes.  HISTORY   Chief Complaint Code Sepsis  Level V caveat: History/ROS limited by altered mental status  HPI Tyler [name] is a 72 y.o. male with a history of arthritis, diabetes, peripheral vascular disease who presents to the ED for possible sepsis.  Patient arrives via EMS from home with reports of altered mental status at the patient did not show up for work this morning.  He had left great toe amputation at some point.  Patient was found with an elevated heart rate and temperature with some altered mental status.  No further information can be obtained.  Past Medical History:  Diagnosis Date  . Arthritis   . Diabetes mellitus without complication ([facility])   . Wears dentures    full upper    There are no active problems to display for this patient.   Past Surgical History:  Procedure Laterality Date  . CATARACT EXTRACTION W/PHACO Right 01/16/2017   Procedure: CATARACT EXTRACTION PHACO AND INTRAOCULAR LENS PLACEMENT ([facility])  Right Complicated;  Surgeon: Lockie Mola, MD;  Location: [facility];  Service: Ophthalmology;  Laterality: Right;  IVA Block Healon 5 malyugin vision blue Diabetic - oral meds  . TOE AMPUTATION Left   . TONSILLECTOMY      Allergies Patient has no known allergies.  Social History Social History   Tobacco Use  . Smoking status: Never Smoker  . Smokeless tobacco: Never Used  Substance Use Topics  . Alcohol use: No  . Drug use: Not on file   Review of Systems Constitutional: Positive for fever Cardiovascular: Positive for tachycardia Neurological: Positive for confusion  All systems negative/normal/unremarkable except as stated in the  HPI  ____________________________________________   PHYSICAL EXAM:  VITAL SIGNS: ED Triage Vitals  Enc Vitals Group     BP 07/03/18 1024 (!) 164/92     Pulse Rate 07/03/18 1024 (!) 134     Resp 07/03/18 1024 (!) 40     Temp --      Temp src --      SpO2 07/03/18 1020 95 %     Weight 07/03/18 1025 220 lb (99.8 kg)     Height 07/03/18 1025 6\' 6"  (1.981 m)     Head Circumference --      Peak Flow --      Pain Score 07/03/18 1025 0     Pain Loc --      Pain Edu? --      Excl. in GC? --    Constitutional: Alert but disoriented.  Mild to moderate distress Eyes: Conjunctivae are normal. Normal extraocular movements. ENT   Head: Normocephalic and atraumatic.   Nose: No congestion/rhinnorhea.   Mouth/Throat: Mucous membranes are moist.   Neck: No stridor. Cardiovascular: Rapid rate, regular rhythm. No murmurs, rubs, or gallops. Respiratory: Normal respiratory effort without tachypnea nor retractions. Breath sounds are clear and equal bilaterally. No wheezes/rales/rhonchi. Gastrointestinal: Soft and nontender. Normal bowel sounds Musculoskeletal: Left great toe amputation, there is an ulceration on the plantar surface of the left foot medially and distally.  There is erythema around the left foot distally and medially Neurologic: Patient is somewhat confused, no gross focal neurologic deficits are appreciated.  Skin: Left foot erythema and wound as dictated above Psychiatric: Mood and affect are normal. Speech and behavior are  normal.  ____________________________________________  EKG: Interpreted by me.  Sinus tachycardia with a rate of 133 bpm, right bundle branch block, and left anterior fascicular block  ____________________________________________  ED COURSE:  As part of my medical decision making, I reviewed the following data within the electronic MEDICAL RECORD NUMBER History obtained from family if available, nursing notes, old chart and ekg, as well as notes from  prior ED visits. Patient presented for possible sepsis, we will assess with labs and imaging as indicated at this time.   Procedures ____________________________________________   LABS (pertinent positives/negatives)  Labs Reviewed  LACTIC ACID, PLASMA - Abnormal; Notable for the following components:      Result Value   Lactic Acid, Venous 4.4 (*)    All other components within normal limits  COMPREHENSIVE METABOLIC PANEL - Abnormal; Notable for the following components:   Sodium 134 (*)    CO2 18 (*)    Glucose, Bld 335 (*)    BUN 29 (*)    Creatinine, Ser 1.56 (*)    Calcium 8.8 (*)    AST 54 (*)    ALT 51 (*)    Alkaline Phosphatase 140 (*)    GFR calc non Af Amer 42 (*)    GFR calc Af Amer 49 (*)    All other components within normal limits  TROPONIN I - Abnormal; Notable for the following components:   Troponin I 0.05 (*)    All other components within normal limits  CBC WITH DIFFERENTIAL/PLATELET - Abnormal; Notable for the following components:   WBC 13.9 (*)    Hemoglobin 12.6 (*)    HCT 38.3 (*)    Neutro Abs 12.7 (*)    Lymphs Abs 0.4 (*)    All other components within normal limits  BLOOD GAS, VENOUS - Abnormal; Notable for the following components:   pCO2, Ven 30 (*)    All other components within normal limits  URINALYSIS, COMPLETE (UACMP) WITH MICROSCOPIC - Abnormal; Notable for the following components:   Color, Urine YELLOW (*)    APPearance CLEAR (*)    Glucose, UA >=500 (*)    Hgb urine dipstick SMALL (*)    Ketones, ur 5 (*)    Protein, ur 30 (*)    All other components within normal limits  CULTURE, BLOOD (ROUTINE X 2)  CULTURE, BLOOD (ROUTINE X 2)  URINE CULTURE  LACTIC ACID, PLASMA   CRITICAL CARE Performed by: Ulice Dash   Total critical care time: 30 minutes  Critical care time was exclusive of separately billable procedures and treating other patients.  Critical care was necessary to treat or prevent imminent or  life-threatening deterioration.  Critical care was time spent personally by me on the following activities: development of treatment plan with patient and/or surrogate as well as nursing, discussions with consultants, evaluation of patient's response to treatment, examination of patient, obtaining history from patient or surrogate, ordering and performing treatments and interventions, ordering and review of laboratory studies, ordering and review of radiographic studies, pulse oximetry and re-evaluation of patient's condition.  RADIOLOGY Images were viewed by me  Chest x-ray IMPRESSION: No acute findings. IMPRESSION: No definite plain film findings for osteomyelitis or septic arthritis. MRI may be helpful for further evaluation.  Status post amputation of the great toe.  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 ____________________________________________  DIFFERENTIAL DIAGNOSIS   Sepsis, UTI, dehydration, electrolyte abnormality, osteomyelitis, cellulitis  FINAL ASSESSMENT AND PLAN  Sepsis   Plan: The patient had presented for likely sepsis. Patient's labs revealed numerous abnormalities including lactic acidosis, elevated troponin, renal insufficiency and hyperglycemia. Patient's imaging was negative for any acute process.  Clinically infection source is likely his left foot.  We have started broad-spectrum antibiotics and sepsis protocol with fluids.  I will discuss with the hospitalist for admission.   Ulice Dash, MD   Note: This note was generated in part or whole with voice recognition software. Voice recognition is usually quite accurate but there are transcription errors that can and very often do occur. I apologize for any typographical errors that were not detected and corrected.     Emily Filbert, MD 07/03/18 1130

## 2018-07-03 NOTE — Progress Notes (Signed)
Family Meeting Note  Advance Directive:yes  Today a meeting took place with the Patient, 2 daughters at bedside     The following clinical team members were present during this meeting:MD  The following were discussed:Patient's diagnosis: Sepsis, AKI, left foot infection, diabetes metas, peripheral vascular disease, hypertensive urgency, chronic arthritis, treatment plan of care discussed in detail with the patient and 2 daughters at bedside.  They both verbalized understanding of the plan   patient's progosis: Unable to determine and Goals for treatment: Full Code  2 daughters Lucy Antigua and [name] are the healthcare power of attorney  Additional follow-up to be provided: Hospitalist  Time spent during discussion:17 min  Ramonita Lab, MD

## 2018-07-04 ENCOUNTER — Inpatient Hospital Stay: Payer: BLUE CROSS/BLUE SHIELD

## 2018-07-04 LAB — GLUCOSE, CAPILLARY
GLUCOSE-CAPILLARY: 97 mg/dL (ref 70–99)
Glucose-Capillary: 123 mg/dL — ABNORMAL HIGH (ref 70–99)
Glucose-Capillary: 87 mg/dL (ref 70–99)
Glucose-Capillary: 105 mg/dL — ABNORMAL HIGH (ref 70–99)

## 2018-07-04 LAB — CBC
HCT: 32.9 % — ABNORMAL LOW (ref 39.0–52.0)
HEMOGLOBIN: 10.5 g/dL — AB (ref 13.0–17.0)
MCH: 28.2 pg (ref 26.0–34.0)
MCHC: 31.9 g/dL (ref 30.0–36.0)
MCV: 88.4 fL (ref 80.0–100.0)
NRBC: 0 % (ref 0.0–0.2)
Platelets: 126 10*3/uL — ABNORMAL LOW (ref 150–400)
RBC: 3.72 MIL/uL — ABNORMAL LOW (ref 4.22–5.81)
RDW: 15.5 % (ref 11.5–15.5)
WBC: 9.5 10*3/uL (ref 4.0–10.5)

## 2018-07-04 LAB — COMPREHENSIVE METABOLIC PANEL
ALT: 50 U/L — AB (ref 0–44)
AST: 75 U/L — AB (ref 15–41)
Albumin: 2.9 g/dL — ABNORMAL LOW (ref 3.5–5.0)
Alkaline Phosphatase: 89 U/L (ref 38–126)
Anion gap: 6 (ref 5–15)
BILIRUBIN TOTAL: 1.1 mg/dL (ref 0.3–1.2)
BUN: 29 mg/dL — ABNORMAL HIGH (ref 8–23)
CHLORIDE: 111 mmol/L (ref 98–111)
CO2: 22 mmol/L (ref 22–32)
Calcium: 8.2 mg/dL — ABNORMAL LOW (ref 8.9–10.3)
Creatinine, Ser: 1.26 mg/dL — ABNORMAL HIGH (ref 0.61–1.24)
GFR calc non Af Amer: 55 mL/min — ABNORMAL LOW (ref >60)
Glucose, Bld: 118 mg/dL — ABNORMAL HIGH (ref 70–99)
POTASSIUM: 3.7 mmol/L (ref 3.5–5.1)
Sodium: 139 mmol/L (ref 135–145)
TOTAL PROTEIN: 6.7 g/dL (ref 6.5–8.1)

## 2018-07-04 LAB — CORTISOL-AM, BLOOD: Cortisol - AM: 26.2 ug/dL — ABNORMAL HIGH (ref 6.7–22.6)

## 2018-07-04 LAB — PROCALCITONIN: PROCALCITONIN: 14.22 ng/mL

## 2018-07-04 LAB — PROTIME-INR
INR: 1.27
Prothrombin Time: 15.8 seconds — ABNORMAL HIGH (ref 11.4–15.2)

## 2018-07-04 MED ORDER — KETOROLAC TROMETHAMINE 15 MG/ML IJ SOLN
15.0000 mg | Freq: Once | INTRAMUSCULAR | Status: AC
  Administered 2018-07-04: 15 mg via INTRAVENOUS
  Filled 2018-07-04: qty 1

## 2018-07-04 MED ORDER — ACETAMINOPHEN 325 MG PO TABS: 650.0000 mg | ORAL_TABLET | ORAL | Status: DC | PRN

## 2018-07-04 MED ORDER — ACETAMINOPHEN 650 MG RE SUPP: 650.0000 mg | Freq: Four times a day (QID) | RECTAL | Status: DC | PRN

## 2018-07-04 MED ORDER — ACETAMINOPHEN 650 MG RE SUPP: 650.0000 mg | RECTAL | Status: DC | PRN

## 2018-07-04 MED ORDER — ACETAMINOPHEN 325 MG PO TABS
650.0000 mg | ORAL_TABLET | ORAL | Status: DC | PRN
  Administered 2018-07-05 – 2018-07-06 (×2): 650 mg via ORAL
  Filled 2018-07-04 (×2): qty 2

## 2018-07-04 MED ORDER — METOPROLOL TARTRATE 50 MG PO TABS
50.0000 mg | ORAL_TABLET | Freq: Two times a day (BID) | ORAL | Status: DC
  Administered 2018-07-04 – 2018-07-06 (×4): 50 mg via ORAL
  Filled 2018-07-04 (×4): qty 1

## 2018-07-04 MED ORDER — MUPIROCIN CALCIUM 2 % EX CREA
TOPICAL_CREAM | Freq: Every day | CUTANEOUS | Status: DC
  Administered 2018-07-06 – 2018-07-07 (×2): via TOPICAL
  Filled 2018-07-04 (×2): qty 15

## 2018-07-04 NOTE — Progress Notes (Signed)
Discussed situation with Dr. Caryn Bee.  Orders given to increase frequency of tylenol from 6 hrs to 4 hrs and to give a one time dose of ketorolac to bring temperature down.  Dr. Caryn Bee considering surgical consult in case MRI, which still needs to be done, is positive for osteomyelitis.

## 2018-07-04 NOTE — Progress Notes (Signed)
Very little improvement noted after tylenol administration; on-call MD notified for further orders.

## 2018-07-04 NOTE — Progress Notes (Signed)
[employer] [facility] [facility] [place] [facility]   PATIENT NAME: Tyler [name]    MR#:  [id number]  DATE OF BIRTH:  09-Jan-1946  SUBJECTIVE:   No acute events overnight.  Family is at bedside.  REVIEW OF SYSTEMS:    Review of Systems  Constitutional: Negative for fever, chills weight loss HENT: Negative for ear pain, nosebleeds, congestion, facial swelling, rhinorrhea, neck pain, neck stiffness and ear discharge.   Respiratory: Negative for cough, shortness of breath, wheezing  Cardiovascular: Negative for chest pain, palpitations and leg swelling.  Gastrointestinal: Negative for heartburn, abdominal pain, vomiting, diarrhea or consitpation Genitourinary: Negative for dysuria, urgency, frequency, hematuria Musculoskeletal: Negative for back pain or joint pain Neurological: Negative for dizziness, seizures, syncope, focal weakness,  numbness and headaches.  Hematological: Does not bruise/bleed easily.  Psychiatric/Behavioral: Negative for hallucinations, confusion, dysphoric mood Skin: Patient has a ulcer at the bottom of his foot with discharge   Tolerating Diet: yes      DRUG ALLERGIES:  No Known Allergies  VITALS:  Blood pressure (!) 155/90, pulse 91, temperature 97.7 F (36.5 C), temperature source Oral, resp. rate 16, height 6\' 6"  (1.981 m), weight 99.8 kg, SpO2 100 %.  PHYSICAL EXAMINATION:  Constitutional: Appears well-developed and well-nourished. No distress. HENT: Normocephalic. Marland Kitchen Oropharynx is clear and moist.  Eyes: Conjunctivae and EOM are normal. PERRLA, no scleral icterus.  Neck: Normal ROM. Neck supple. No JVD. No tracheal deviation. CVS: RRR, S1/S2 +, no murmurs, no gallops, no carotid bruit.  Pulmonary: Effort and breath sounds normal, no stridor, rhonchi, wheezes, rales.  Abdominal: Soft. BS +,  no distension, tenderness, rebound or guarding.  Musculoskeletal: Normal range of motion. No edema and no tenderness.  Neuro: Alert. CN 2-12 grossly intact.  No focal deficits. Skin: left plantar with ulcer and discharge (cultured by Dr Orland Jarred) Psychiatric: Normal mood and affect.      LABORATORY PANEL:   CBC Recent Labs  Lab 07/04/18 0423  WBC 9.5  HGB 10.5*  HCT 32.9*  PLT 126*   ------------------------------------------------------------------------------------------------------------------  Chemistries  Recent Labs  Lab 07/04/18 0423  NA 139  K 3.7  CL 111  CO2 22  GLUCOSE 118*  BUN 29*  CREATININE 1.26*  CALCIUM 8.2*  AST 75*  ALT 50*  ALKPHOS 89  BILITOT 1.1   ------------------------------------------------------------------------------------------------------------------  Cardiac Enzymes Recent Labs  Lab 07/03/18 1027  TROPONINI 0.05*   ------------------------------------------------------------------------------------------------------------------  RADIOLOGY:  Dg Chest Port 1 View  Result Date: 07/03/2018 CLINICAL DATA:  Altered mental status, tachycardia. EXAM: PORTABLE CHEST 1 VIEW COMPARISON:  07/05/2014. FINDINGS: Patient is rotated. Trachea is midline. Heart is enlarged, as before. Lungs are low in volume but clear. No pleural fluid. IMPRESSION: No acute findings. Electronically Signed   By: Leanna Battles M.D.   On: 07/03/2018 11:18   Dg Foot Complete Left  Result Date: 07/03/2018 CLINICAL DATA:  Left foot pain. Diabetic ulcer involving the plantar aspect of the foot near the first and second MTP joints. History of prior amputation of the great toe. EXAM: LEFT FOOT - COMPLETE 3+ VIEW COMPARISON:  Radiographs 07/05/2014 FINDINGS: Prior amputation of the great toe. I do not see any obvious destructive bony changes involving the first metatarsal head to suggest osteomyelitis. 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 No definite findings for septic arthritis or osteomyelitis. The mid and hindfoot bony  structures are intact. Small vessel calcifications are noted. Diffuse subcutaneous  soft tissue swelling/edema suggesting cellulitis. IMPRESSION: No definite plain film findings for osteomyelitis or septic arthritis. MRI may be helpful for further evaluation. 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 Electronically Signed   By: Rudie Meyer M.D.   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  1.  Acute metabolic encephalopathy in the setting of sepsis with bacteremia due to infected foot: Mental status is improving however still not at baseline and therefore CT head ordered.  2.  Sepsis with strep species bacteremia from plantar ulcer left foot: Continue cefepime ID consultation due to bacteremia Podiatry consultation requested Sepsis slowly improving Continue broad-spectrum antibiotics May discontinue vancomycin Plan for debridement tomorrow F/u final cultures   3.  Hypertensive urgency: Blood pressure slowly improving: Continue metoprolol  4.  Acute kidney injury in the setting of sepsis which is improved  5.  Diabetes: Continue current regimen with ADA diet and sliding scale Management plans discussed with the patient and family and they are in agreement.  CODE STATUS: full  TOTAL TIME TAKING CARE OF THIS PATIENT: 30 minutes.     POSSIBLE D/C 2-4 days, DEPENDING ON CLINICAL CONDITION.   Malek Skog M.D on 07/04/2018 at 12:18 PM  Between 7am to 6pm - Pager - [phone] After 6pm go to [email or web address] - password [email or web address]  [facility]  Office  [phone]  CC: Primary care physician; Lynnea Ferrier, MD  Note: This dictation was prepared with Dragon dictation along with  smaller phrase technology. Any transcriptional errors that result from this process are unintentional.

## 2018-07-04 NOTE — Consult Note (Addendum)
WOC Nurse wound consult note Reason for Consult: Consult requested for bilat feet.  MRI results are pending. Wound type: Left plantar foot with full thickness wound; 1X1X.8cm, dark reddish brown wound bed, small amt tan drainage, slight odor, no fluctuance Right plantar great toe with dry dark brown raised callous 2X1.8cm, removes easily when cleansed with skin prep, revealing full thickness wound 1X1X.2cm with dark brown dry wound bed; no odor, drainage, or fluctuance. Dressing procedure/placement/frequency: Bactroban to provide antimicrobial benefits and promote moist healing to bilat foot wounds.  Encouraged patient to followup with a podiatrist after discharge to keep callous trimmed. Please re-consult if further assistance is needed.  Thank-you,  Cammie Mcgee MSN, RN, CWOCN, [place], CNS [phone]

## 2018-07-04 NOTE — Consult Note (Signed)
[facility]                                                      Patient Demographics  Tyler [name], is a 72 y.o. male   MRN: [id number]   DOB - 1946/05/04  Admit Date - 07/03/2018    Outpatient Primary MD for the patient is Adin Hector, MD  Consult requested in the Hospital by Bettey Costa, MD, On 07/04/2018    Reason for consult infected diabetic ulcer left foot   With History of -  Past Medical History:  Diagnosis Date  . Arthritis   . Diabetes mellitus without complication ([place])   . Wears dentures    full upper      Past Surgical History:  Procedure Laterality Date  . CATARACT EXTRACTION W/PHACO Right 01/16/2017   Procedure: CATARACT EXTRACTION PHACO AND INTRAOCULAR LENS PLACEMENT ([place])  Right Complicated;  [name], MD;  Location: [facility];  Service: Ophthalmology;  Laterality: Right;  IVA Block Healon 5 malyugin vision blue Diabetic - oral meds  . TOE AMPUTATION Left   . TONSILLECTOMY      in for   Chief Complaint  Patient presents with  . Code Sepsis     HPI  Tyler [name]  is a 72 y.o. male, patient amputation of the left great toe about 4 years ago.  States that he had a wound there since that time frame, improves then breaks down through the buttocks down he thought recently was getting better.  States that he had refused the area of surgery from his doctor Dr. Sharlotte Alamo.  This is confirmed by Dr. Caryl Comes as well that he did not want any over-the-counter surgery on his foot.  He presents today with cellulitis drainage and was admitted for sepsis likely secondary to this.    Review of Systems    In addition to the HPI above,  No Fever-chills, No Headache, No changes with Vision or hearing, No problems swallowing food or Liquids, No Chest pain, Cough or Shortness of Breath, No Abdominal  pain, No Nausea or Vommitting, Bowel movements are regular, No Blood in stool or Urine, No dysuria, No new skin rashes or bruises, No new joints pains-aches,  No new weakness, tingling, numbness in any extremity, No recent weight gain or loss, No polyuria, polydypsia or polyphagia, No significant Mental Stressors.  A full 10 point Review of Systems was done, except as stated above, all other Review of Systems were negative.   Social History Social History   Tobacco Use  . Smoking status: Never Smoker  . Smokeless tobacco: Never Used  Substance Use Topics  . Alcohol use: No    Family History History reviewed. No pertinent family history.  Prior to Admission medications   Medication Sig Start Date End Date Taking? Authorizing Provider  glipiZIDE (GLUCOTROL) 10 MG tablet Take 10 mg by mouth 2 (two) times daily. 06/20/18  Yes [provider]  metFORMIN (GLUCOPHAGE) 1000 MG tablet Take 1,000 mg by mouth 2 (two) times daily. 06/17/18  Yes [provider]  Multiple Vitamin (MULTIVITAMIN) tablet Take 1 tablet by mouth daily.   Yes [provider]    Anti-infectives (From admission, onward)   Start     Dose/Rate Route Frequency Ordered Stop   07/03/18 1700  vancomycin (VANCOCIN) 1,250 mg in sodium chloride 0.9 % 250 mL IVPB     1,250 mg 166.7 mL/hr over 90 Minutes Intravenous Every 12 hours 07/03/18 1223     07/03/18 1700  ceFEPIme (MAXIPIME) 2 g in sodium chloride 0.9 % 100 mL IVPB     2 g 200 mL/hr over 30 Minutes Intravenous Every 12 hours 07/03/18 1223     07/03/18 1500  metroNIDAZOLE (FLAGYL) IVPB 500 mg     500 mg 100 mL/hr over 60 Minutes Intravenous Every 8 hours 07/03/18 1410     07/03/18 1415  ceFEPIme (MAXIPIME) 2 g in sodium chloride 0.9 % 100 mL IVPB  Status:  Discontinued     2 g 200 mL/hr over 30 Minutes Intravenous  Once 07/03/18 1410 07/03/18 1425   07/03/18 1415  vancomycin (VANCOCIN) IVPB 1000 mg/200 mL premix  Status:  Discontinued      1,000 mg 200 mL/hr over 60 Minutes Intravenous  Once 07/03/18 1410 07/03/18 1427   07/03/18 1030  vancomycin (VANCOCIN) IVPB 1000 mg/200 mL premix     1,000 mg 200 mL/hr over 60 Minutes Intravenous  Once 07/03/18 1023 07/03/18 1207   07/03/18 1030  piperacillin-tazobactam (ZOSYN) IVPB 3.375 g     3.375 g 100 mL/hr over 30 Minutes Intravenous  Once 07/03/18 1023 07/03/18 1130      Scheduled Meds: . docusate sodium  100 mg Oral BID  . glipiZIDE  10 mg Oral BID  . heparin  5,000 Units Subcutaneous Q8H  . insulin aspart  0-5 Units Subcutaneous QHS  . insulin aspart  0-9 Units Subcutaneous TID WC  . labetalol  10 mg Intravenous Once  . metoprolol tartrate  50 mg Oral BID  . multivitamin with minerals  1 tablet Oral Daily  . mupirocin cream   Topical Daily   Continuous Infusions: . sodium chloride    . ceFEPime (MAXIPIME) IV 2 g (07/04/18 0853)  . metronidazole 500 mg (07/04/18 0749)  . vancomycin 1,250 mg (07/04/18 0641)   PRN Meds:.acetaminophen **OR** acetaminophen, hydrALAZINE, metoprolol tartrate, ondansetron **OR** ondansetron (ZOFRAN) IV  No Known Allergies  Physical Exam  Vitals  Blood pressure (!) 155/90, pulse 91, temperature 97.7 F (36.5 C), temperature source Oral, resp. rate 16, height 6' 6"  (1.981 m), weight 99.8 kg, SpO2 100 %.  Lower Extremity exam:  Vascular: DP PT pulses are present but diminished  Dermatological: Patient has an ulceration sub-met one on the left foot.  The wound is about 2 cm in diameter.  Probing centrally the wound probes all the way to bone and is not really a lot of soft tissue protection in the region.  He had a previous amputation to the great toe to the MTP joint level.  There is a smaller wound medially that tracks and attaches To the larger plantar wound.  Neurological: Habit neuropathy bilateral  Ortho: Amputation to the left great toe.  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  Data Review  CBC Recent Labs  Lab 07/03/18 1027 07/04/18 0423  WBC 13.9* 9.5  HGB 12.6* 10.5*  HCT 38.3* 32.9*  PLT 189 126*  MCV 87.4 88.4  MCH 28.8 28.2  MCHC 32.9 31.9  RDW 15.3 15.5  LYMPHSABS 0.4*  --   MONOABS 0.6  --   EOSABS 0.1  --  BASOSABS 0.0  --    ------------------------------------------------------------------------------------------------------------------  Chemistries  Recent Labs  Lab 07/03/18 1027 07/04/18 0423  NA 134* 139  K 5.0 3.7  CL 104 111  CO2 18* 22  GLUCOSE 335* 118*  BUN 29* 29*  CREATININE 1.56* 1.26*  CALCIUM 8.8* 8.2*  AST 54* 75*  ALT 51* 50*  ALKPHOS 140* 89  BILITOT 1.2 1.1   ------------------------------------------------------------------------------------------------------------------ estimated creatinine clearance is 67.5 mL/min (A) (by C-G formula based on SCr of 1.26 mg/dL (H)). ------------------------------------------------------------------------------------------------------------------ No results for input(s): TSH, T4TOTAL, T3FREE, THYROIDAB in the last 72 hours.  Invalid input(s): FREET3 Urinalysis    Component Value Date/Time   COLORURINE YELLOW (A) 07/03/2018 1027   APPEARANCEUR CLEAR (A) 07/03/2018 1027   APPEARANCEUR Clear 07/05/2014 2040   LABSPEC 1.016 07/03/2018 1027   LABSPEC 1.030 07/05/2014 2040   PHURINE 5.0 07/03/2018 1027   GLUCOSEU >=500 (A) 07/03/2018 1027   GLUCOSEU >=500 07/05/2014 2040   HGBUR SMALL (A) 07/03/2018 1027   BILIRUBINUR NEGATIVE 07/03/2018 1027   BILIRUBINUR Negative 07/05/2014 2040   KETONESUR 5 (A) 07/03/2018 1027   PROTEINUR 30 (A) 07/03/2018 1027   NITRITE NEGATIVE 07/03/2018 1027   LEUKOCYTESUR NEGATIVE 07/03/2018 1027   LEUKOCYTESUR Negative 07/05/2014 2040     Imaging results:   Dg Chest Port 1 View  Result Date:  07/03/2018 CLINICAL DATA:  Altered mental status, tachycardia. EXAM: PORTABLE CHEST 1 VIEW COMPARISON:  07/05/2014. FINDINGS: Patient is rotated. Trachea is midline. Heart is enlarged, as before. Lungs are low in volume but clear. No pleural fluid. IMPRESSION: No acute findings. Electronically Signed   By: Lorin Picket M.D.   On: 07/03/2018 11:18   Dg Foot Complete Left  Result Date: 07/03/2018 CLINICAL DATA:  Left foot pain. Diabetic ulcer involving the plantar aspect of the foot near the first and second MTP joints. History of prior amputation of the great toe. EXAM: LEFT FOOT - COMPLETE 3+ VIEW COMPARISON:  Radiographs 07/05/2014 FINDINGS: Prior amputation of the great toe. I do not see any obvious destructive bony changes involving the first metatarsal head to suggest osteomyelitis. 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 No definite findings for septic arthritis or osteomyelitis. The mid and hindfoot bony structures are intact. Small vessel calcifications are noted. Diffuse subcutaneous soft tissue swelling/edema suggesting cellulitis. IMPRESSION: No definite plain film findings for osteomyelitis or septic arthritis. MRI may be helpful for further evaluation. 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 Electronically Signed   By: Marijo Sanes M.D.   On: 07/03/2018 11:20    Assessment & Plan: MRI today shows noted increased signal in the first metatarsal head consistent with the osteomyelitis that I suspected just from being will probe to the bone at this point.  I spent about 20 minutes with patient and his family explaining the need to remove this infected portion of bone and also to clean up all the infected soft tissue in the region if he is to get better.  He was very resistant first but eventually started to  understand why we have to do something.  Plus his daughters are present and I think they have a good influence on him and have a better understanding of the needs.  I do not think we will need to remove the lesser digits but I think the first metatarsal needs  to be resected approximately two thirds of the way back.  I will try to get this set up for tomorrow and will need not only bone resection with clean up of infected soft tissue and skin as well.  I explained the consent form to undergo and get that signed.  He the family are in agreement with my plan.  Active Problems:   Sepsis [facility])   Family Communication: Plan discussed with patient and family  Albertine Patricia M.D on 07/04/2018 at 1:03 PM  Thank you for the consult, we will follow the patient with you in the Hospital.

## 2018-07-05 ENCOUNTER — Encounter
Admission: EM | Disposition: A | Discharge status: Home Health | Payer: Self-pay | Source: Home / Self Care | Attending: Internal Medicine

## 2018-07-05 ENCOUNTER — Inpatient Hospital Stay: Payer: BLUE CROSS/BLUE SHIELD | Admitting: Anesthesiology

## 2018-07-05 ENCOUNTER — Encounter: Payer: Self-pay | Admitting: Anesthesiology

## 2018-07-05 HISTORY — PX: METATARSAL HEAD EXCISION: SHX5027

## 2018-07-05 LAB — CBC
HEMATOCRIT: 33.5 % — AB (ref 39.0–52.0)
Hemoglobin: 11.1 g/dL — ABNORMAL LOW (ref 13.0–17.0)
MCH: 28.8 pg (ref 26.0–34.0)
MCHC: 33.1 g/dL (ref 30.0–36.0)
MCV: 87 fL (ref 80.0–100.0)
Platelets: 137 10*3/uL — ABNORMAL LOW (ref 150–400)
RBC: 3.85 MIL/uL — ABNORMAL LOW (ref 4.22–5.81)
RDW: 15.6 % — AB (ref 11.5–15.5)
WBC: 9 10*3/uL (ref 4.0–10.5)
nRBC: 0 % (ref 0.0–0.2)

## 2018-07-05 LAB — URINE CULTURE: Culture: 40000 — AB

## 2018-07-05 LAB — PROCALCITONIN: Procalcitonin: 8.27 ng/mL

## 2018-07-05 LAB — GLUCOSE, CAPILLARY
GLUCOSE-CAPILLARY: 122 mg/dL — AB (ref 70–99)
GLUCOSE-CAPILLARY: 117 mg/dL — AB (ref 70–99)
GLUCOSE-CAPILLARY: 164 mg/dL — AB (ref 70–99)
Glucose-Capillary: 82 mg/dL (ref 70–99)
Glucose-Capillary: 72 mg/dL (ref 70–99)

## 2018-07-05 LAB — BASIC METABOLIC PANEL
Anion gap: 8 (ref 5–15)
BUN: 22 mg/dL (ref 8–23)
CHLORIDE: 108 mmol/L (ref 98–111)
CO2: 22 mmol/L (ref 22–32)
CREATININE: 0.96 mg/dL (ref 0.61–1.24)
Calcium: 8.4 mg/dL — ABNORMAL LOW (ref 8.9–10.3)
GFR calc Af Amer: >60 mL/min (ref >60)
GFR calc non Af Amer: >60 mL/min (ref >60)
GLUCOSE: 91 mg/dL (ref 70–99)
Potassium: 3.8 mmol/L (ref 3.5–5.1)
Sodium: 138 mmol/L (ref 135–145)

## 2018-07-05 SURGERY — EXCISION, METATARSAL BONE, HEAD
Anesthesia: Monitor Anesthesia Care | Laterality: Left

## 2018-07-05 MED ORDER — DEXTROSE 50 % IV SOLN
25.0000 mL | Freq: Once | INTRAVENOUS | Status: AC
  Administered 2018-07-05: 25 mL via INTRAVENOUS

## 2018-07-05 MED ORDER — SODIUM CHLORIDE 0.9 % IV SOLN
INTRAVENOUS | Status: DC | PRN
  Administered 2018-07-05: 500 mL via INTRAVENOUS

## 2018-07-05 MED ORDER — FENTANYL CITRATE (PF) 100 MCG/2ML IJ SOLN: 25.0000 ug | INTRAMUSCULAR | Status: DC | PRN

## 2018-07-05 MED ORDER — PROPOFOL 10 MG/ML IV BOLUS
INTRAVENOUS | Status: DC | PRN
  Administered 2018-07-05: 200 mg via INTRAVENOUS

## 2018-07-05 MED ORDER — ONDANSETRON HCL 4 MG/2ML IJ SOLN
INTRAMUSCULAR | Status: AC
  Filled 2018-07-05: qty 2

## 2018-07-05 MED ORDER — GENTAMICIN SULFATE 40 MG/ML IJ SOLN
INTRAMUSCULAR | Status: AC
  Filled 2018-07-05: qty 2

## 2018-07-05 MED ORDER — PHENYLEPHRINE HCL 10 MG/ML IJ SOLN
INTRAMUSCULAR | Status: DC | PRN
  Administered 2018-07-05: 150 ug via INTRAVENOUS
  Administered 2018-07-05: 100 ug via INTRAVENOUS
  Administered 2018-07-05: 150 ug via INTRAVENOUS
  Administered 2018-07-05: 200 ug via INTRAVENOUS
  Administered 2018-07-05 (×2): 150 ug via INTRAVENOUS
  Administered 2018-07-05: 100 ug via INTRAVENOUS
  Administered 2018-07-05 (×2): 150 ug via INTRAVENOUS

## 2018-07-05 MED ORDER — BUPIVACAINE HCL (PF) 0.5 % IJ SOLN
INTRAMUSCULAR | Status: AC
  Filled 2018-07-05: qty 30

## 2018-07-05 MED ORDER — DEXTROSE 50 % IV SOLN
INTRAVENOUS | Status: AC
  Filled 2018-07-05: qty 50

## 2018-07-05 MED ORDER — BUPIVACAINE HCL (PF) 0.25 % IJ SOLN
INTRAMUSCULAR | Status: AC
  Filled 2018-07-05: qty 30

## 2018-07-05 MED ORDER — LIDOCAINE HCL (PF) 1 % IJ SOLN
INTRAMUSCULAR | Status: AC
  Filled 2018-07-05: qty 30

## 2018-07-05 MED ORDER — LACTATED RINGERS IV SOLN
INTRAVENOUS | Status: DC | PRN
  Administered 2018-07-05: 08:00:00 via INTRAVENOUS

## 2018-07-05 MED ORDER — ONDANSETRON HCL 4 MG/2ML IJ SOLN: 4.0000 mg | Freq: Once | INTRAMUSCULAR | Status: DC | PRN

## 2018-07-05 MED ORDER — ACETAMINOPHEN 10 MG/ML IV SOLN
INTRAVENOUS | Status: AC
  Filled 2018-07-05: qty 100

## 2018-07-05 MED ORDER — LIDOCAINE HCL (CARDIAC) PF 100 MG/5ML IV SOSY
PREFILLED_SYRINGE | INTRAVENOUS | Status: DC | PRN
  Administered 2018-07-05: 100 mg via INTRAVENOUS

## 2018-07-05 MED ORDER — VANCOMYCIN HCL 1000 MG IV SOLR
INTRAVENOUS | Status: AC
  Filled 2018-07-05: qty 1000

## 2018-07-05 MED ORDER — GENTAMICIN SULFATE 40 MG/ML IJ SOLN
INTRAMUSCULAR | Status: AC
  Filled 2018-07-05: qty 4

## 2018-07-05 MED ORDER — ONDANSETRON HCL 4 MG/2ML IJ SOLN
INTRAMUSCULAR | Status: DC | PRN
  Administered 2018-07-05: 4 mg via INTRAVENOUS

## 2018-07-05 MED ORDER — BUPIVACAINE LIPOSOME 1.3 % IJ SUSP
INTRAMUSCULAR | Status: AC
  Filled 2018-07-05: qty 20

## 2018-07-05 MED ORDER — BUPIVACAINE-EPINEPHRINE (PF) 0.25% -1:200000 IJ SOLN
INTRAMUSCULAR | Status: DC | PRN
  Administered 2018-07-05: 5 mL

## 2018-07-05 MED ORDER — FENTANYL CITRATE (PF) 100 MCG/2ML IJ SOLN
INTRAMUSCULAR | Status: DC | PRN
  Administered 2018-07-05 (×2): 50 ug via INTRAVENOUS

## 2018-07-05 MED ORDER — FENTANYL CITRATE (PF) 100 MCG/2ML IJ SOLN
INTRAMUSCULAR | Status: AC
  Filled 2018-07-05: qty 2

## 2018-07-05 MED ORDER — BUPIVACAINE LIPOSOME 1.3 % IJ SUSP
INTRAMUSCULAR | Status: DC | PRN
  Administered 2018-07-05: 5 mL
  Administered 2018-07-05: 15 mL

## 2018-07-05 MED ORDER — PROPOFOL 10 MG/ML IV BOLUS
INTRAVENOUS | Status: AC
  Filled 2018-07-05: qty 20

## 2018-07-05 MED ORDER — LIDOCAINE HCL (PF) 2 % IJ SOLN
INTRAMUSCULAR | Status: AC
  Filled 2018-07-05: qty 10

## 2018-07-05 SURGICAL SUPPLY — 37 items
BAG COUNTER SPONGE EZ (MISCELLANEOUS) ×2 IMPLANT
BANDAGE ELASTIC 6 LF NS (GAUZE/BANDAGES/DRESSINGS) ×3 IMPLANT
BNDG CONFORM 3 STRL LF (GAUZE/BANDAGES/DRESSINGS) ×3 IMPLANT
BNDG ESMARK 4X12 TAN STRL LF (GAUZE/BANDAGES/DRESSINGS) ×3 IMPLANT
BNDG GAUZE 4.5X4.1 6PLY STRL (MISCELLANEOUS) ×3 IMPLANT
CLOSURE WOUND 1/4X4 (GAUZE/BANDAGES/DRESSINGS) ×1
COUNTER SPONGE BAG EZ (MISCELLANEOUS) ×1
COVER WAND RF STERILE (DRAPES) ×3 IMPLANT
CUFF TOURN 18 STER (MISCELLANEOUS) ×3 IMPLANT
CUFF TOURN DUAL PL 12 NO SLV (MISCELLANEOUS) IMPLANT
DRAPE FLUOR MINI C-ARM 54X84 (DRAPES) IMPLANT
DRSG MEPITEL 4X7.2 (GAUZE/BANDAGES/DRESSINGS) ×3 IMPLANT
DRSG TEGADERM 4X4.75 (GAUZE/BANDAGES/DRESSINGS) IMPLANT
DURAPREP 26ML APPLICATOR (WOUND CARE) ×3 IMPLANT
ELECT REM PT RETURN 9FT ADLT (ELECTROSURGICAL) ×3
ELECTRODE REM PT RTRN 9FT ADLT (ELECTROSURGICAL) ×1 IMPLANT
GAUZE PETRO XEROFOAM 1X8 (MISCELLANEOUS) ×3 IMPLANT
GAUZE SPONGE 4X4 12PLY STRL (GAUZE/BANDAGES/DRESSINGS) ×3 IMPLANT
GAUZE STRETCH 2X75IN STRL (MISCELLANEOUS) IMPLANT
GAUZE XEROFORM 4X4 STRL (GAUZE/BANDAGES/DRESSINGS) ×3 IMPLANT
GLOVE BIO SURGEON STRL SZ8 (GLOVE) ×9 IMPLANT
GLOVE INDICATOR 8.0 STRL GRN (GLOVE) ×3 IMPLANT
GOWN STRL REUS W/ TWL LRG LVL3 (GOWN DISPOSABLE) ×1 IMPLANT
GOWN STRL REUS W/TWL LRG LVL3 (GOWN DISPOSABLE) ×2
KIT STIMULAN RAPID CURE 5CC (Orthopedic Implant) ×3 IMPLANT
KIT TURNOVER KIT A (KITS) ×3 IMPLANT
LABEL OR SOLS (LABEL) ×3 IMPLANT
NDL SAFETY ECLIPSE 18X1.5 (NEEDLE) ×1 IMPLANT
NEEDLE HYPO 18GX1.5 SHARP (NEEDLE) ×2
NEEDLE HYPO 25X1 1.5 SAFETY (NEEDLE) ×6 IMPLANT
NS IRRIG 500ML POUR BTL (IV SOLUTION) ×3 IMPLANT
PACK EXTREMITY ARMC (MISCELLANEOUS) ×3 IMPLANT
PENCIL ELECTRO HAND CTR (MISCELLANEOUS) ×3 IMPLANT
STOCKINETTE M/LG 89821 (MISCELLANEOUS) ×3 IMPLANT
STRIP CLOSURE SKIN 1/4X4 (GAUZE/BANDAGES/DRESSINGS) ×2 IMPLANT
SUT VIC AB 4-0 FS2 27 (SUTURE) ×3 IMPLANT
SUT VICRYL AB 3-0 FS1 BRD 27IN (SUTURE) ×3 IMPLANT

## 2018-07-05 NOTE — Op Note (Addendum)
Operative note   Surgeon: Dr. Recardo Evangelist, [name].    Assistant: None    Preop diagnosis: Osteomyelitis first metatarsal left foot t    Postop diagnosis: Same    Procedure:   1.  First metatarsal resection left foot          EBL: 40 cc    Anesthesia:general delivered by the anesthesia team I delivered 20 cc of Exparel 10 cc that was mixed with 0.25% Marcaine plain both preoperatively and postoperatively divided doses    Hemostasis: Ankle tourniquet 250 mils mercury for 20 minutes    Specimen: Bone culture was sent.  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 After anesthesia was obtained theleft lower extremity was prepped and draped in usual sterile fashion.  Operative Report: At this time tissue directed to the dorsal medial aspect of the first metatarsal.  There is drainage tract on the medial aspect of the metatarsal.  To look incisions approximately 4 cm long were made in this section was removed.  The incision was deepened down to bone at this point.  Soft tissue was removed from the dorsal and plantar medial aspects of the first metatarsal.  A wound was also noted submetatarsal 1 this tracked all the way to the metatarsal head and also to the medial sinus tract earlier.  The proximal portion of the metatarsal shaft was inspected and a osteotomy was performed at an area where the bone was solid.  The metatarsal was then freed from its soft tissue attachments and removed.  The metatarsal head was notedly degenerative and a portion of it was removed for culture and sensitivity.  At this time I inspected the  proximal shaft of the metatarsal like to take another 8 mm of bone off of the proximal shaft to try to make sure we had clear margins on the region.  This was sent to pathology for evaluation.  I requested they send it down today with hopes of getting it read early Monday morning hopefully when his cultures and start to come back.  Next the area was copiously irrigated and a versa jet was used to clean and debride the plantar ulceration and any soft tissue infected areas within the wound.  This was set on level 2.  At this point antibiotic beads with gentamicin and vancomycin were placed into the wound.  Capsule tissue was then closed with 3-0 Vicryl in a continuous stitch.  Several veins and potential arteries were cauterized and sutured earlier.  Tourniquet is released at this timeframe and mostly capillary and venous bleeding was noted.  No pulsatile flow was encountered.  The remaining Tissue was then closed over the beads.  Skin was then closed with 3-0 nylon simple interrupted horizontal mattress combination.  The foot was scanned and had no Ray-Tec remained.  A sterile compressive dressing was then placed on the area following the Exparel injection.  This consisted of Mepitel over the plantar ulcer Xeroform gauze 4 x 4's conformer ABD pads and Kerlix and Ace wrap.    Patient tolerated the procedure and anesthesia well.  Was transported from the OR to the PACU with  all vital signs stable and vascular status intact. To be discharged per routine protocol.  Will follow up in approximately 1 week in the outpatient clinic.

## 2018-07-05 NOTE — Progress Notes (Signed)
No additional coverage needed in addition to Cefepime.  Microbiology 10/10 BCx 2/2 Gram Positive Cocci 10/10 UCx (clean catch) 30000 CFU staph aureus   Mauri Reading, PharmD Pharmacy Resident  07/05/2018 10:15 AM

## 2018-07-05 NOTE — Anesthesia Preprocedure Evaluation (Addendum)
Anesthesia Evaluation  Patient identified by MRN, date of birth, ID band Patient awake    Reviewed: Allergy & Precautions, H&P , NPO status , Patient's Chart, lab work & pertinent test results  Airway Mallampati: I  TM Distance: >3 FB Neck ROM: full    Dental  (+) Edentulous Upper   Pulmonary neg pulmonary ROS,    Pulmonary exam normal        Cardiovascular negative cardio ROS Normal cardiovascular exam     Neuro/Psych negative neurological ROS  negative psych ROS   GI/Hepatic negative GI ROS, Neg liver ROS,   Endo/Other  diabetes, Well Controlled, Type 1, Insulin Dependent  Renal/GU negative Renal ROS  negative genitourinary   Musculoskeletal  (+) Arthritis , Osteoarthritis,    Abdominal   Peds negative pediatric ROS (+)  Hematology negative hematology ROS (+)   Anesthesia Other Findings   Reproductive/Obstetrics                             Anesthesia Physical  Anesthesia Plan  ASA: III and emergent  Anesthesia Plan: General   Post-op Pain Management:    Induction: Intravenous  PONV Risk Score and Plan:   Airway Management Planned: LMA  Additional Equipment:   Intra-op Plan:   Post-operative Plan: Extubation in OR  Informed Consent: I have reviewed the patients History and Physical, chart, labs and discussed the procedure including the risks, benefits and alternatives for the proposed anesthesia with the patient or authorized representative who has indicated his/her understanding and acceptance.     Plan Discussed with: CRNA and Surgeon  Anesthesia Plan Comments:        Anesthesia Quick Evaluation

## 2018-07-05 NOTE — Anesthesia Post-op Follow-up Note (Signed)
Anesthesia QCDR form completed.        

## 2018-07-05 NOTE — Anesthesia Procedure Notes (Signed)
Procedure Name: LMA Insertion Date/Time: 07/05/2018 8:15 AM Performed by: Estanislado Emms, CRNA Pre-anesthesia Checklist: Patient identified, Patient being monitored, Timeout performed, Emergency Drugs available and Suction available Patient Re-evaluated:Patient Re-evaluated prior to induction Oxygen Delivery Method: Circle system utilized Preoxygenation: Pre-oxygenation with 100% oxygen Induction Type: IV induction Ventilation: Mask ventilation without difficulty LMA: LMA inserted LMA Size: 5.0 Tube type: Oral Number of attempts: 1 Placement Confirmation: positive ETCO2 and breath sounds checked- equal and bilateral Tube secured with: Tape Dental Injury: Teeth and Oropharynx as per pre-operative assessment

## 2018-07-05 NOTE — Progress Notes (Signed)
[employer] [facility] [place] at [facility]   PATIENT NAME: Tyler [name]    MR#:  [id number]  DATE OF BIRTH:  Aug 17, 1946  SUBJECTIVE:   Patient is status post first metatarsal resection   REVIEW OF SYSTEMS:    Review of Systems  Constitutional: Negative for fever, chills weight loss HENT: Negative for ear pain, nosebleeds, congestion, facial swelling, rhinorrhea, neck pain, neck stiffness and ear discharge.   Respiratory: Negative for cough, shortness of breath, wheezing  Cardiovascular: Negative for chest pain, palpitations and leg swelling.  Gastrointestinal: Negative for heartburn, abdominal pain, vomiting, diarrhea or consitpation Genitourinary: Negative for dysuria, urgency, frequency, hematuria Musculoskeletal: Negative for back pain or joint pain Neurological: Negative for dizziness, seizures, syncope, focal weakness,  numbness and headaches.  Hematological: Does not bruise/bleed easily.  Psychiatric/Behavioral: Negative for hallucinations, confusion, dysphoric mood  Tolerating Diet: yes      DRUG ALLERGIES:  No Known Allergies  VITALS:  Blood pressure (!) 150/77, pulse 94, temperature 99.1 F (37.3 C), resp. rate (!) 25, height 6\' 6"  (1.981 m), weight 108.8 kg, SpO2 95 %.  PHYSICAL EXAMINATION:  Constitutional: Appears well-developed and well-nourished. No distress. HENT: Normocephalic. Marland Kitchen Oropharynx is clear and moist.  Eyes: Conjunctivae and EOM are normal. PERRLA, no scleral icterus.  Neck: Normal ROM. Neck supple. No JVD. No tracheal deviation. CVS: RRR, S1/S2 +, no murmurs, no gallops, no carotid bruit.  Pulmonary: Effort and breath sounds normal, no stridor, rhonchi, wheezes, rales.  Abdominal: Soft. BS +,  no distension, tenderness, rebound or guarding.  Musculoskeletal: Normal range of motion. No edema and no tenderness.  Neuro: Alert. CN 2-12 grossly intact. No focal deficits. Skin: wrappedPsychiatric: Normal mood and affect.       LABORATORY PANEL:   CBC Recent Labs  Lab 07/05/18 0606  WBC 9.0  HGB 11.1*  HCT 33.5*  PLT 137*   ------------------------------------------------------------------------------------------------------------------  Chemistries  Recent Labs  Lab 07/04/18 0423 07/05/18 0606  NA 139 138  K 3.7 3.8  CL 111 108  CO2 22 22  GLUCOSE 118* 91  BUN 29* 22  CREATININE 1.26* 0.96  CALCIUM 8.2* 8.4*  AST 75*  --   ALT 50*  --   ALKPHOS 89  --   BILITOT 1.1  --    ------------------------------------------------------------------------------------------------------------------  Cardiac Enzymes Recent Labs  Lab 07/03/18 1027  TROPONINI 0.05*   ------------------------------------------------------------------------------------------------------------------  RADIOLOGY:  Ct Head Wo Contrast  Result Date: 07/04/2018 CLINICAL DATA:  Altered mental status and fever EXAM: CT HEAD WITHOUT CONTRAST TECHNIQUE: Contiguous axial images were obtained from the base of the skull through the vertex without intravenous contrast. COMPARISON:  None. FINDINGS: Brain: There is age related volume loss. There is no intracranial mass, hemorrhage, extra-axial fluid collection, or midline shift. There is an asymmetric deep sulcus in the medial right temporal lobe, a presumed anatomic variant. There is patchy small vessel disease in the centra semiovale bilaterally. There is evidence of prior infarct in the left temporal lobe. No well-defined acute infarct is appreciable on this study. Vascular: No hyperdense vessel. There is calcification in each distal vertebral artery and carotid siphon region. Skull: Bony calvarium appears intact. Sinuses/Orbits: There is mucosal thickening in several ethmoid air cells. Other paranasal sinuses are clear. Orbits appear symmetric bilaterally. Incidental note is made of previous cataract removal on the right. Other: Mastoid air cells are clear. IMPRESSION: Atrophy with  periventricular small vessel disease. Prior infarct left temporal lobe. No acute infarct is demonstrated on this study. No  mass or hemorrhage. There are foci of arterial vascular calcification. There is mucosal thickening in several ethmoid air cells. Electronically Signed   By: Bretta Bang III M.D.   On: 07/04/2018 13:26   Mr Foot Left W Wo Contrast  Result Date: 07/04/2018 CLINICAL DATA:  Skin ulceration on the plantar surface of the left foot deep to the first metatarsal head in a diabetic patient. History of prior great toe amputation. EXAM: MRI OF THE LEFT FOREFOOT WITHOUT AND WITH CONTRAST TECHNIQUE: Multiplanar, multisequence MR imaging of the left forefoot was performed both before and after administration of intravenous contrast. CONTRAST:  10 cc Gadavist IV COMPARISON:  Plain films left foot 07/03/2018. FINDINGS: Bones/Joint/Cartilage The patient is status post amputation of the great toe. Intense edema and enhancement are seen in approximately the distal 3.5 cm of the first metatarsal and in the medial and lateral sesamoids consistent with osteomyelitis. No other marrow signal abnormality to suggest osteomyelitis is identified. Flattening of the heads of the second and third metatarsals and mild to moderate degenerative change about the second and third MTP joints are noted. No fracture. Ligaments Intact. Muscles and Tendons There is atrophy of intrinsic musculature the foot. No intramuscular fluid collection. Soft tissues Skin ulceration on the plantar surface of the foot deep to the head of the first metatarsal extends almost to bone. IMPRESSION: Findings consistent with osteomyelitis in the distal 3.5 cm of the first metatarsal and in the medial and lateral sesamoids. Negative for abscess or septic joint. Status post amputation of the great toe. 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 Electronically Signed   By: Drusilla Kanner M.D.   On: 07/04/2018 13:08   Dg Chest Port 1 View  Result Date: 07/03/2018 CLINICAL DATA:  Altered mental status, tachycardia. EXAM: PORTABLE CHEST 1 VIEW COMPARISON:  07/05/2014. FINDINGS: Patient is rotated. Trachea is midline. Heart is enlarged, as before. Lungs are low in volume but clear. No pleural fluid. IMPRESSION: No acute findings. Electronically Signed   By: Leanna Battles M.D.   On: 07/03/2018 11:18   Dg Foot Complete Left  Result Date: 07/03/2018 CLINICAL DATA:  Left foot pain. Diabetic ulcer involving the plantar aspect of the foot near the first and second MTP joints. History of prior amputation of the great toe. EXAM: LEFT FOOT - COMPLETE 3+ VIEW COMPARISON:  Radiographs 07/05/2014 FINDINGS: Prior amputation of the great toe. I do not see any obvious destructive bony changes involving the first metatarsal head to suggest osteomyelitis. 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 No definite findings for septic arthritis or osteomyelitis. The mid and hindfoot bony structures are intact. Small vessel calcifications are noted. Diffuse subcutaneous soft tissue swelling/edema suggesting cellulitis. IMPRESSION: No definite plain film findings for osteomyelitis or septic arthritis. MRI may be helpful for further evaluation. 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 Electronically Signed   By: Rudie Meyer M.D.   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  1.  Acute metabolic encephalopathy in the setting of  sepsis with  bacteremia due to infected foot: Mental status has improved and patient is at baseline. CT head showed no acute infarct   2.  Sepsis with strep species bacteremia from Findings consistent with osteomyelitis in the distal 3.5 cm of the first metatarsal and in the medial and lateral sesamoids.  He is postoperative day #0 for first metatarsal resection Continue cefepime for now He will need IV PICC Line likely. I will speak with ID consultant on Monday. Podiatry consult appreciated. Follow-up on final wound cultures  3.  Hypertensive urgency: Blood pressure slowly improving: I Continue metoprolol and adjust if needed tomorrow  4.  Acute kidney injury in the setting of sepsis which is improved  5.  Diabetes: Continue current regimen with ADA diet and sliding scale  6.  Staph aureus urinary tract infection:Pharamacy consult for ABX.  Management plans discussed with the patient and family and they are in agreement.  CODE STATUS: full  TOTAL TIME TAKING CARE OF THIS PATIENT: 24 minutes.     POSSIBLE D/C 2 days, DEPENDING ON CLINICAL CONDITION.   Shaneka Efaw M.D on 07/05/2018 at 9:44 AM  Between 7am to 6pm - Pager - [phone] After 6pm go to [email or web address] - password [email or web address]  [facility]  Office  [phone]  CC: Primary care physician; Lynnea Ferrier, MD  Note: This dictation was prepared with Dragon dictation along with smaller phrase technology. Any transcriptional errors that result from this process are unintentional.

## 2018-07-05 NOTE — Consult Note (Signed)
Pharmacy Antibiotic Note  Tyler [name] is a 72 y.o. male admitted on 07/03/2018 with cellulitis.  PMH includes arthritis, PVD, and DM. He is s/p amputation of the left great toe. X-ray of that toe not conclusive to rule out osteomyelitis. Lactic acid is elevated at 4.4, PCT pending. Pharmacy has been consulted for vancomycin and cefepime dosing. There is no previous h/o vancomycin dosing to guide therapy.  Plan: cefepime 2grams IV every 12 hours  Height: 6\' 6"  (198.1 cm) Weight: 239 lb 14.4 oz (108.8 kg) IBW/kg (Calculated) : 91.4  Temp (24hrs), Avg:98.6 F (37 C), Min:98 F (36.7 C), Max:99.1 F (37.3 C)  Recent Labs  Lab 07/03/18 1027 07/03/18 1323 07/04/18 0423 07/05/18 0606  WBC 13.9*  --  9.5 9.0  CREATININE 1.56*  --  1.26* 0.96  LATICACIDVEN 4.4* 3.6*  --   --     Estimated Creatinine Clearance: 88.6 mL/min (by C-G formula based on SCr of 0.96 mg/dL).    No Known Allergies  Antimicrobials this admission: Vancomycin  10/10 >> 10/11 Cefepime 10/10 >>  Zosyn 10/10 x1  Microbiology results: 10/10 BCx: strep C 10/10 UCx: SA  Thank you for allowing pharmacy to be a part of this patient's care.  Marty Heck, PharmD 07/05/2018 2:46 PM

## 2018-07-05 NOTE — Transfer of Care (Signed)
Immediate Anesthesia Transfer of Care Note  Patient: Tyler [name]  Procedure(s) Performed: RESECTION FIRST METATARSAL INFECTED BONE AND SOFT TISSUE (Left )  Patient Location: PACU  Anesthesia Type:General  Level of Consciousness: drowsy  Airway & Oxygen Therapy: Patient Spontanous Breathing and Patient connected to nasal cannula oxygen  Post-op Assessment: Report given to RN and Post -op Vital signs reviewed and stable  Post vital signs: Reviewed and stable  Last Vitals:  Vitals Value Taken Time  BP 147/83 07/05/2018  9:25 AM  Temp 37.3 C 07/05/2018  9:24 AM  Pulse 97 07/05/2018  9:25 AM  Resp 23 07/05/2018  9:25 AM  SpO2 95 % 07/05/2018  9:25 AM  Vitals shown include unvalidated device data.  Last Pain:  Vitals:   07/05/18 0321  TempSrc: Oral  PainSc:          Complications: No apparent anesthesia complications

## 2018-07-06 ENCOUNTER — Encounter: Payer: Self-pay | Admitting: Podiatry

## 2018-07-06 LAB — BASIC METABOLIC PANEL
Anion gap: 5 (ref 5–15)
BUN: 22 mg/dL (ref 8–23)
CO2: 22 mmol/L (ref 22–32)
CREATININE: 0.88 mg/dL (ref 0.61–1.24)
Calcium: 8.1 mg/dL — ABNORMAL LOW (ref 8.9–10.3)
Chloride: 112 mmol/L — ABNORMAL HIGH (ref 98–111)
GFR calc non Af Amer: >60 mL/min (ref >60)
Glucose, Bld: 70 mg/dL (ref 70–99)
POTASSIUM: 3.5 mmol/L (ref 3.5–5.1)
Sodium: 139 mmol/L (ref 135–145)

## 2018-07-06 LAB — CULTURE, BLOOD (ROUTINE X 2)

## 2018-07-06 LAB — GLUCOSE, CAPILLARY
GLUCOSE-CAPILLARY: 59 mg/dL — AB (ref 70–99)
GLUCOSE-CAPILLARY: 112 mg/dL — AB (ref 70–99)
GLUCOSE-CAPILLARY: 114 mg/dL — AB (ref 70–99)
Glucose-Capillary: 156 mg/dL — ABNORMAL HIGH (ref 70–99)
Glucose-Capillary: 82 mg/dL (ref 70–99)

## 2018-07-06 MED ORDER — METOPROLOL TARTRATE 50 MG PO TABS
100.0000 mg | ORAL_TABLET | Freq: Two times a day (BID) | ORAL | Status: DC
  Administered 2018-07-06 – 2018-07-08 (×4): 100 mg via ORAL
  Filled 2018-07-06 (×4): qty 2

## 2018-07-06 NOTE — Anesthesia Postprocedure Evaluation (Signed)
Anesthesia Post Note  Patient: Tyler [name]  Procedure(s) Performed: RESECTION FIRST METATARSAL INFECTED BONE AND SOFT TISSUE (Left )  Patient location during evaluation: PACU Anesthesia Type: General Level of consciousness: awake and alert and oriented Pain management: pain level controlled Vital Signs Assessment: post-procedure vital signs reviewed and stable Respiratory status: spontaneous breathing Cardiovascular status: blood pressure returned to baseline Anesthetic complications: no     Last Vitals:  Vitals:   07/06/18 0938 07/06/18 1320  BP: (!) 199/93 (!) 155/81  Pulse: (!) 104 87  Resp: 19   Temp:    SpO2: 95% 96%    Last Pain:  Vitals:   07/06/18 0831  TempSrc:   PainSc: 0-No pain                 Eldred Lievanos

## 2018-07-06 NOTE — Progress Notes (Signed)
[facility]                                                      Patient Demographics  Tyler [name], is a 72 y.o. male   MRN: [id number]   DOB - 03-Jun-1946  Admit Date - 07/03/2018    Outpatient Primary MD for the patient is Lynnea Ferrier, MD  Consult requested in the Hospital by Adrian Saran, MD, On 07/06/2018    With History of -  Past Medical History:  Diagnosis Date  . Arthritis   . Diabetes mellitus without complication (HCC)   . Wears dentures    full upper      Past Surgical History:  Procedure Laterality Date  . CATARACT EXTRACTION W/PHACO Right 01/16/2017   Procedure: CATARACT EXTRACTION PHACO AND INTRAOCULAR LENS PLACEMENT (IOC)  Right Complicated;  Surgeon: Lockie Mola, MD;  Location: [facility];  Service: Ophthalmology;  Laterality: Right;  IVA Block Healon 5 malyugin vision blue Diabetic - oral meds  . TOE AMPUTATION Left   . TONSILLECTOMY      in for   Chief Complaint  Patient presents with  . Code Sepsis     HPI  Tyler [name]  is a 72 y.o. male, 1 day status post resection of infected first metatarsal left foot secondary to chronic osteomyelitis and chronic ulceration on the plantar left foot    Review of Systems: He is alert well oriented and states he does not have any pain at all.  In addition to the HPI above,  No Fever-chills, No Headache, No changes with Vision or hearing, No problems swallowing food or Liquids, No Chest pain, Cough or Shortness of Breath, No Abdominal pain, No Nausea or Vommitting, Bowel movements are regular, No Blood in stool or Urine, No dysuria, No new skin rashes or bruises, No new joints pains-aches,  No new weakness, tingling, numbness in any extremity, No recent weight gain or loss, No polyuria, polydypsia or polyphagia, No significant Mental Stressors.  A  full 10 point Review of Systems was done, except as stated above, all other Review of Systems were negative.   Social History Social History   Tobacco Use  . Smoking status: Never Smoker  . Smokeless tobacco: Never Used  Substance Use Topics  . Alcohol use: No    Family History History reviewed. No pertinent family history.  Prior to Admission medications   Medication Sig Start Date End Date Taking? Authorizing Provider  glipiZIDE (GLUCOTROL) 10 MG tablet Take 10 mg by mouth 2 (two) times daily. 06/20/18  Yes [provider]  metFORMIN (GLUCOPHAGE) 1000 MG tablet Take 1,000 mg by mouth 2 (two) times daily. 06/17/18  Yes [provider]  Multiple Vitamin (MULTIVITAMIN) tablet Take 1 tablet by mouth daily.   Yes [provider]    Anti-infectives (From admission, onward)   Start     Dose/Rate Route Frequency Ordered Stop   07/03/18 1700  vancomycin (VANCOCIN) 1,250 mg in sodium chloride 0.9 % 250 mL IVPB  Status:  Discontinued     1,250 mg 166.7 mL/hr over 90 Minutes Intravenous Every 12 hours 07/03/18 1223 07/04/18 1320   07/03/18 1700  ceFEPIme (MAXIPIME) 2 g in sodium chloride 0.9 % 100 mL IVPB     2 g 200 mL/hr over 30 Minutes  Intravenous Every 12 hours 07/03/18 1223     07/03/18 1500  metroNIDAZOLE (FLAGYL) IVPB 500 mg     500 mg 100 mL/hr over 60 Minutes Intravenous Every 8 hours 07/03/18 1410     07/03/18 1415  ceFEPIme (MAXIPIME) 2 g in sodium chloride 0.9 % 100 mL IVPB  Status:  Discontinued     2 g 200 mL/hr over 30 Minutes Intravenous  Once 07/03/18 1410 07/03/18 1425   07/03/18 1415  vancomycin (VANCOCIN) IVPB 1000 mg/200 mL premix  Status:  Discontinued     1,000 mg 200 mL/hr over 60 Minutes Intravenous  Once 07/03/18 1410 07/03/18 1427   07/03/18 1030  vancomycin (VANCOCIN) IVPB 1000 mg/200 mL premix     1,000 mg 200 mL/hr over 60 Minutes Intravenous  Once 07/03/18 1023 07/03/18 1207   07/03/18 1030  piperacillin-tazobactam (ZOSYN) IVPB  3.375 g     3.375 g 100 mL/hr over 30 Minutes Intravenous  Once 07/03/18 1023 07/03/18 1130      Scheduled Meds: . docusate sodium  100 mg Oral BID  . glipiZIDE  10 mg Oral BID  . heparin  5,000 Units Subcutaneous Q8H  . insulin aspart  0-5 Units Subcutaneous QHS  . insulin aspart  0-9 Units Subcutaneous TID WC  . labetalol  10 mg Intravenous Once  . metoprolol tartrate  100 mg Oral BID  . multivitamin with minerals  1 tablet Oral Daily  . mupirocin cream   Topical Daily   Continuous Infusions: . sodium chloride    . sodium chloride Stopped (07/05/18 0825)  . ceFEPime (MAXIPIME) IV 2 g (07/06/18 0945)  . metronidazole 500 mg (07/06/18 0654)   PRN Meds:.sodium chloride, acetaminophen **OR** acetaminophen, hydrALAZINE, metoprolol tartrate, ondansetron **OR** ondansetron (ZOFRAN) IV  No Known Allergies  Physical Exam  Vitals  Blood pressure (!) 199/93, pulse (!) 104, temperature 97.9 F (36.6 C), temperature source Oral, resp. rate 19, height 6\' 6"  (1.981 m), weight 108.8 kg, SpO2 95 %.  Lower Extremity exam: Bandages clean dry and intact I will leave intact today and defer changing at this point.  White count is down within normal limits this juncture.  Ortho: 1 day status post first ray amputation to the very proximal shaft of the first metatarsal.  I took a little extra portion out and sent both the main body of the bone and that extra portion for pathological evaluation to make sure there is clean margin there.  Data Review  CBC Recent Labs  Lab 07/03/18 1027 07/04/18 0423 07/05/18 0606  WBC 13.9* 9.5 9.0  HGB 12.6* 10.5* 11.1*  HCT 38.3* 32.9* 33.5*  PLT 189 126* 137*  MCV 87.4 88.4 87.0  MCH 28.8 28.2 28.8  MCHC 32.9 31.9 33.1  RDW 15.3 15.5 15.6*  LYMPHSABS 0.4*  --   --   MONOABS 0.6  --   --   EOSABS 0.1  --   --   BASOSABS 0.0  --   --     ------------------------------------------------------------------------------------------------------------------  Chemistries  Recent Labs  Lab 07/03/18 1027 07/04/18 0423 07/05/18 0606 07/06/18 0517  NA 134* 139 138 139  K 5.0 3.7 3.8 3.5  CL 104 111 108 112*  CO2 18* 22 22 22   GLUCOSE 335* 118* 91 70  BUN 29* 29* 22 22  CREATININE 1.56* 1.26* 0.96 0.88  CALCIUM 8.8* 8.2* 8.4* 8.1*  AST 54* 75*  --   --   ALT 51* 50*  --   --  ALKPHOS 140* 89  --   --   BILITOT 1.2 1.1  --   --    ------------------------------------------------------------------------------------------------------------------ estimated creatinine clearance is 96.7 mL/min (by C-G formula based on SCr of 0.88 mg/dL). ------------------------------------------------------------------------------------------------------------------ No results for input(s): TSH, T4TOTAL, T3FREE, THYROIDAB in the last 72 hours.  Invalid input(s): FREET3 Urinalysis    Component Value Date/Time   COLORURINE YELLOW (A) 07/03/2018 1027   APPEARANCEUR CLEAR (A) 07/03/2018 1027   APPEARANCEUR Clear 07/05/2014 2040   LABSPEC 1.016 07/03/2018 1027   LABSPEC 1.030 07/05/2014 2040   PHURINE 5.0 07/03/2018 1027   GLUCOSEU >=500 (A) 07/03/2018 1027   GLUCOSEU >=500 07/05/2014 2040   HGBUR SMALL (A) 07/03/2018 1027   BILIRUBINUR NEGATIVE 07/03/2018 1027   BILIRUBINUR Negative 07/05/2014 2040   KETONESUR 5 (A) 07/03/2018 1027   PROTEINUR 30 (A) 07/03/2018 1027   NITRITE NEGATIVE 07/03/2018 1027   LEUKOCYTESUR NEGATIVE 07/03/2018 1027   LEUKOCYTESUR Negative 07/05/2014 2040     Imaging results:   Ct Head Wo Contrast  Result Date: 07/04/2018 CLINICAL DATA:  Altered mental status and fever EXAM: CT HEAD WITHOUT CONTRAST TECHNIQUE: Contiguous axial images were obtained from the base of the skull through the vertex without intravenous contrast. COMPARISON:  None. FINDINGS: Brain: There is age related volume loss. There is no  intracranial mass, hemorrhage, extra-axial fluid collection, or midline shift. There is an asymmetric deep sulcus in the medial right temporal lobe, a presumed anatomic variant. There is patchy small vessel disease in the centra semiovale bilaterally. There is evidence of prior infarct in the left temporal lobe. No well-defined acute infarct is appreciable on this study. Vascular: No hyperdense vessel. There is calcification in each distal vertebral artery and carotid siphon region. Skull: Bony calvarium appears intact. Sinuses/Orbits: There is mucosal thickening in several ethmoid air cells. Other paranasal sinuses are clear. Orbits appear symmetric bilaterally. Incidental note is made of previous cataract removal on the right. Other: Mastoid air cells are clear. IMPRESSION: Atrophy with periventricular small vessel disease. Prior infarct left temporal lobe. No acute infarct is demonstrated on this study. No mass or hemorrhage. There are foci of arterial vascular calcification. There is mucosal thickening in several ethmoid air cells. Electronically Signed   By: Bretta Bang III M.D.   On: 07/04/2018 13:26   Mr Foot Left W Wo Contrast  Result Date: 07/04/2018 CLINICAL DATA:  Skin ulceration on the plantar surface of the left foot deep to the first metatarsal head in a diabetic patient. History of prior great toe amputation. EXAM: MRI OF THE LEFT FOREFOOT WITHOUT AND WITH CONTRAST TECHNIQUE: Multiplanar, multisequence MR imaging of the left forefoot was performed both before and after administration of intravenous contrast. CONTRAST:  10 cc Gadavist IV COMPARISON:  Plain films left foot 07/03/2018. FINDINGS: Bones/Joint/Cartilage The patient is status post amputation of the great toe. Intense edema and enhancement are seen in approximately the distal 3.5 cm of the first metatarsal and in the medial and lateral sesamoids consistent with osteomyelitis. No other marrow signal abnormality to suggest  osteomyelitis is identified. Flattening of the heads of the second and third metatarsals and mild to moderate degenerative change about the second and third MTP joints are noted. No fracture. Ligaments Intact. Muscles and Tendons There is atrophy of intrinsic musculature the foot. No intramuscular fluid collection. Soft tissues Skin ulceration on the plantar surface of the foot deep to the head of the first metatarsal extends almost to bone. IMPRESSION: Findings consistent with osteomyelitis in the  distal 3.5 cm of the first metatarsal and in the medial and lateral sesamoids. Negative for abscess or septic joint. Status post amputation of the great toe. 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 Electronically Signed   By: Drusilla Kanner M.D.   On: 07/04/2018 13:08    Assessment & Plan: We will just leave the dressing intact at present.  Looks like his culture may come back as a Streptococcus group C but not final yet.  Infectious disease has been consulted.  One my partners will follow him next week for hopeful discharge tomorrow or Tuesday.  Since procedure was done on the weekend there really still be read is Monday from pathology standpoint.  I explained to the OR personnel that need to be sent down with recommendations this is done first thing Monday morning.  Active Problems:   Sepsis [facility])   Family Communication: Plan discussed with patient and **  Recardo Evangelist M.D on 07/06/2018 at 12:15 PM  Thank you for the consult, we will follow the patient with you in the Hospital.

## 2018-07-06 NOTE — Plan of Care (Signed)
  Problem: Health Behavior/Discharge Planning: Goal: Ability to manage health-related needs will improve Outcome: Progressing Note:  Patient to possibly receive a PICC line tomorrow for long term IV antibiotic administration at home. No orders as of yet. Patient remains on Maxipeme and Flagyl. Will continue to monitor for signs / symptoms of infection. Jari Favre [facility]

## 2018-07-06 NOTE — Progress Notes (Signed)
[facility] - [place] at [facility]   PATIENT NAME: Tyler [name]    MR#:  [id number]  DATE OF BIRTH:  [birth year]  SUBJECTIVE:   Patient without pain doing ok this am  REVIEW OF SYSTEMS:    Review of Systems  Constitutional: Negative for fever, chills weight loss HENT: Negative for ear pain, nosebleeds, congestion, facial swelling, rhinorrhea, neck pain, neck stiffness and ear discharge.   Respiratory: Negative for cough, shortness of breath, wheezing  Cardiovascular: Negative for chest pain, palpitations and leg swelling.  Gastrointestinal: Negative for heartburn, abdominal pain, vomiting, diarrhea or consitpation Genitourinary: Negative for dysuria, urgency, frequency, hematuria Musculoskeletal: Negative for back pain or joint pain Neurological: Negative for dizziness, seizures, syncope, focal weakness,  numbness and headaches.  Hematological: Does not bruise/bleed easily.  Psychiatric/Behavioral: Negative for hallucinations, confusion, dysphoric mood  Tolerating Diet: yes      DRUG ALLERGIES:  No Known Allergies  VITALS:  Blood pressure (!) 199/93, pulse (!) 104, temperature 97.9 F (36.6 C), temperature source Oral, resp. rate 19, height 6\' 6"  (1.981 m), weight 108.8 kg, SpO2 95 %.  PHYSICAL EXAMINATION:  Constitutional: Appears well-developed and well-nourished. No distress. HENT: Normocephalic. Marland Kitchen Oropharynx is clear and moist.  Eyes: Conjunctivae and EOM are normal. PERRLA, no scleral icterus.  Neck: Normal ROM. Neck supple. No JVD. No tracheal deviation. CVS: RRR, S1/S2 +, no murmurs, no gallops, no carotid bruit.  Pulmonary: Effort and breath sounds normal, no stridor, rhonchi, wheezes, rales.  Abdominal: Soft. BS +,  no distension, tenderness, rebound or guarding.  Musculoskeletal: Normal range of motion. No edema and no tenderness.  Neuro: Alert. CN 2-12 grossly intact. No focal deficits. Skin: wrapped in ace bandage  Psychiatric: Normal mood  and affect.      LABORATORY PANEL:   CBC Recent Labs  Lab 07/05/18 0606  WBC 9.0  HGB 11.1*  HCT 33.5*  PLT 137*   ------------------------------------------------------------------------------------------------------------------  Chemistries  Recent Labs  Lab 07/04/18 0423  07/06/18 0517  NA 139   < > 139  K 3.7   < > 3.5  CL 111   < > 112*  CO2 22   < > 22  GLUCOSE 118*   < > 70  BUN 29*   < > 22  CREATININE 1.26*   < > 0.88  CALCIUM 8.2*   < > 8.1*  AST 75*  --   --   ALT 50*  --   --   ALKPHOS 89  --   --   BILITOT 1.1  --   --    < > = values in this interval not displayed.   ------------------------------------------------------------------------------------------------------------------  Cardiac Enzymes Recent Labs  Lab 07/03/18 1027  TROPONINI 0.05*   ------------------------------------------------------------------------------------------------------------------  RADIOLOGY:  Ct Head Wo Contrast  Result Date: 07/04/2018 CLINICAL DATA:  Altered mental status and fever EXAM: CT HEAD WITHOUT CONTRAST TECHNIQUE: Contiguous axial images were obtained from the base of the skull through the vertex without intravenous contrast. COMPARISON:  None. FINDINGS: Brain: There is age related volume loss. There is no intracranial mass, hemorrhage, extra-axial fluid collection, or midline shift. There is an asymmetric deep sulcus in the medial right temporal lobe, a presumed anatomic variant. There is patchy small vessel disease in the centra semiovale bilaterally. There is evidence of prior infarct in the left temporal lobe. No well-defined acute infarct is appreciable on this study. Vascular: No hyperdense vessel. There is calcification in each distal vertebral artery and carotid siphon region.  Skull: Bony calvarium appears intact. Sinuses/Orbits: There is mucosal thickening in several ethmoid air cells. Other paranasal sinuses are clear. Orbits appear symmetric  bilaterally. Incidental note is made of previous cataract removal on the right. Other: Mastoid air cells are clear. IMPRESSION: Atrophy with periventricular small vessel disease. Prior infarct left temporal lobe. No acute infarct is demonstrated on this study. No mass or hemorrhage. There are foci of arterial vascular calcification. There is mucosal thickening in several ethmoid air cells. Electronically Signed   By: Bretta Bang III M.D.   On: 07/04/2018 13:26   Mr Foot Left W Wo Contrast  Result Date: 07/04/2018 CLINICAL DATA:  Skin ulceration on the plantar surface of the left foot deep to the first metatarsal head in a diabetic patient. History of prior great toe amputation. EXAM: MRI OF THE LEFT FOREFOOT WITHOUT AND WITH CONTRAST TECHNIQUE: Multiplanar, multisequence MR imaging of the left forefoot was performed both before and after administration of intravenous contrast. CONTRAST:  10 cc Gadavist IV COMPARISON:  Plain films left foot 07/03/2018. FINDINGS: Bones/Joint/Cartilage The patient is status post amputation of the great toe. Intense edema and enhancement are seen in approximately the distal 3.5 cm of the first metatarsal and in the medial and lateral sesamoids consistent with osteomyelitis. No other marrow signal abnormality to suggest osteomyelitis is identified. Flattening of the heads of the second and third metatarsals and mild to moderate degenerative change about the second and third MTP joints are noted. No fracture. Ligaments Intact. Muscles and Tendons There is atrophy of intrinsic musculature the foot. No intramuscular fluid collection. Soft tissues Skin ulceration on the plantar surface of the foot deep to the head of the first metatarsal extends almost to bone. IMPRESSION: Findings consistent with osteomyelitis in the distal 3.5 cm of the first metatarsal and in the medial and lateral sesamoids. Negative for abscess or septic joint. Status post amputation of the great toe.  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 Electronically Signed   By: Drusilla Kanner M.D.   On: 07/04/2018 13:08   Dg Chest Port 1 View  Result Date: 07/03/2018 CLINICAL DATA:  Altered mental status, tachycardia. EXAM: PORTABLE CHEST 1 VIEW COMPARISON:  07/05/2014. FINDINGS: Patient is rotated. Trachea is midline. Heart is enlarged, as before. Lungs are low in volume but clear. No pleural fluid. IMPRESSION: No acute findings. Electronically Signed   By: Leanna Battles M.D.   On: 07/03/2018 11:18   Dg Foot Complete Left  Result Date: 07/03/2018 CLINICAL DATA:  Left foot pain. Diabetic ulcer involving the plantar aspect of the foot near the first and second MTP joints. History of prior amputation of the great toe. EXAM: LEFT FOOT - COMPLETE 3+ VIEW COMPARISON:  Radiographs 07/05/2014 FINDINGS: Prior amputation of the great toe. I do not see any obvious destructive bony changes involving the first metatarsal head to suggest osteomyelitis. 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 No definite findings for septic arthritis or osteomyelitis. The mid and hindfoot bony structures are intact. Small vessel calcifications are noted. Diffuse subcutaneous soft tissue swelling/edema suggesting cellulitis. IMPRESSION: No definite plain film findings for osteomyelitis or septic arthritis. MRI may be helpful for further evaluation. 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 Electronically Signed   By: Rudie Meyer M.D.   On: 07/03/2018 11:20     ASSESSMENT AND PLAN:    72 year old male with history of diabetes, peripheral vascular disease with chronic left foot wound  status post great toe amputation who presented to the ER with fever and confusion.  1.  Acute metabolic encephalopathy in the setting of sepsis with bacteremia due to infected foot: Mental status has improved and patient is at baseline. CT head showed no acute infarct   2.  Sepsis with group strep C species bacteremia from Findings consistent with osteomyelitis in the distal 3.5 cm of the first metatarsal and in the medial and lateral sesamoids.  He is postoperative day #1 for first metatarsal resection Continue cefepime and flagyl He will likely need IV PICC Line  I will speak with ID  on Monday. Podiatry consult appreciated.  3.  Hypertensive urgency: Blood pressure elevated this morning I have increased metoprolol dose I will continue to monitor  4.  Acute kidney injury in the setting of sepsis which has improved  5.  Diabetes: Continue current regimen with ADA diet and sliding scale  6.  Staph aureus urinary tract infection: He needs cefepime   management plans discussed with the patient and family and they are in agreement.  CODE STATUS: full  TOTAL TIME TAKING CARE OF THIS PATIENT: 24 minutes.     POSSIBLE D/C 2 days, DEPENDING ON CLINICAL CONDITION.   Meigan Pates M.D on 07/06/2018 at 10:19 AM  Between 7am to 6pm - Pager - [phone] After 6pm go to [email or web address] - password [email or web address]  [facility]  Office  [phone]  CC: Primary care physician; Lynnea Ferrier, MD  Note: This dictation was prepared with Dragon dictation along with smaller phrase technology. Any transcriptional errors that result from this process are unintentional.

## 2018-07-06 NOTE — Progress Notes (Signed)
Wound care completed at this time on R foot. Patient tolerated well. Foam pad dated and timed. Will continue to monitor dressing status. Tyler [name] [facility]

## 2018-07-07 LAB — CBC
HCT: 33.8 % — ABNORMAL LOW (ref 39.0–52.0)
HEMOGLOBIN: 10.9 g/dL — AB (ref 13.0–17.0)
MCH: 27.9 pg (ref 26.0–34.0)
MCHC: 32.2 g/dL (ref 30.0–36.0)
MCV: 86.4 fL (ref 80.0–100.0)
Platelets: 162 10*3/uL (ref 150–400)
RBC: 3.91 MIL/uL — AB (ref 4.22–5.81)
RDW: 15.4 % (ref 11.5–15.5)
WBC: 6.4 10*3/uL (ref 4.0–10.5)
nRBC: 0 % (ref 0.0–0.2)

## 2018-07-07 LAB — BASIC METABOLIC PANEL
Anion gap: 8 (ref 5–15)
BUN: 17 mg/dL (ref 8–23)
CHLORIDE: 109 mmol/L (ref 98–111)
CO2: 23 mmol/L (ref 22–32)
Calcium: 8.2 mg/dL — ABNORMAL LOW (ref 8.9–10.3)
Creatinine, Ser: 0.89 mg/dL (ref 0.61–1.24)
GFR calc Af Amer: >60 mL/min (ref >60)
GFR calc non Af Amer: >60 mL/min (ref >60)
GLUCOSE: 102 mg/dL — AB (ref 70–99)
POTASSIUM: 3.4 mmol/L — AB (ref 3.5–5.1)
Sodium: 140 mmol/L (ref 135–145)

## 2018-07-07 LAB — GLUCOSE, CAPILLARY
GLUCOSE-CAPILLARY: 135 mg/dL — AB (ref 70–99)
GLUCOSE-CAPILLARY: 147 mg/dL — AB (ref 70–99)
Glucose-Capillary: 91 mg/dL (ref 70–99)
Glucose-Capillary: 125 mg/dL — ABNORMAL HIGH (ref 70–99)

## 2018-07-07 MED ORDER — AMLODIPINE BESYLATE 5 MG PO TABS
5.0000 mg | ORAL_TABLET | Freq: Every day | ORAL | Status: DC
  Administered 2018-07-07: 5 mg via ORAL
  Filled 2018-07-07: qty 1

## 2018-07-07 MED ORDER — LOPERAMIDE HCL 2 MG PO CAPS
2.0000 mg | ORAL_CAPSULE | Freq: Once | ORAL | Status: AC
  Administered 2018-07-07: 2 mg via ORAL
  Filled 2018-07-07: qty 1

## 2018-07-07 MED ORDER — CEFAZOLIN SODIUM-DEXTROSE 2-4 GM/100ML-% IV SOLN
2.0000 g | Freq: Three times a day (TID) | INTRAVENOUS | Status: DC
  Administered 2018-07-07 – 2018-07-08 (×3): 2 g via INTRAVENOUS
  Filled 2018-07-07 (×7): qty 100

## 2018-07-07 NOTE — Consult Note (Addendum)
Pharmacy Antibiotic Note  Tyler [name] is a 72 y.o. male admitted on 07/03/2018 with bacteremia and osteomyelitis.  Pharmacy has been consulted for cefazolin dosing.  Plan: Cefazolin 2g q 8 hr  Height: 6\' 6"  (198.1 cm) Weight: 239 lb 14.4 oz (108.8 kg) IBW/kg (Calculated) : 91.4  Temp (24hrs), Avg:98.2 F (36.8 C), Min:97.9 F (36.6 C), Max:98.4 F (36.9 C)  Recent Labs  Lab 07/03/18 1027 07/03/18 1323 07/04/18 0423 07/05/18 0606 07/06/18 0517 07/07/18 0440  WBC 13.9*  --  9.5 9.0  --  6.4  CREATININE 1.56*  --  1.26* 0.96 0.88 0.89  LATICACIDVEN 4.4* 3.6*  --   --   --   --     Estimated Creatinine Clearance: 95.6 mL/min (by C-G formula based on SCr of 0.89 mg/dL).    No Known Allergies  Antimicrobials this admission: cefepime 10 /10>> 10/14 metronidazole 10/10 >> 10/14 Vancomycin 10/10>>10/11 Cefazolin 10/14>>  Dose adjustments this admission:   Microbiology results: 10/10 BCx: group C strep 10/13 Bcx:NG 10/11 foot cx group C strep, MSSA  Thank you for allowing pharmacy to be a part of this patient's care.  Olene Floss, Pharm.D, BCPS Clinical Pharmacist 07/07/2018 12:44 PM

## 2018-07-07 NOTE — Plan of Care (Signed)
  Problem: Education: Goal: Knowledge of General Education information will improve Description: Including pain rating scale, medication(s)/side effects and non-pharmacologic comfort measures Outcome: Progressing   Problem: Clinical Measurements: Goal: Will remain free from infection Outcome: Progressing Goal: Diagnostic test results will improve Outcome: Progressing   Problem: Pain Managment: Goal: General experience of comfort will improve Outcome: Progressing   Problem: Safety: Goal: Ability to remain free from injury will improve Outcome: Progressing   

## 2018-07-07 NOTE — Progress Notes (Signed)
[employer] - [place] at [facility]   PATIENT NAME: Tyler [name]    MR#:  [id number]  DATE OF BIRTH:  1945/11/15  SUBJECTIVE:  Patient doing well this morning.  No acute events overnight.  REVIEW OF SYSTEMS:    Review of Systems  Constitutional: Negative for fever, chills weight loss HENT: Negative for ear pain, nosebleeds, congestion, facial swelling, rhinorrhea, neck pain, neck stiffness and ear discharge.   Respiratory: Negative for cough, shortness of breath, wheezing  Cardiovascular: Negative for chest pain, palpitations and leg swelling.  Gastrointestinal: Negative for heartburn, abdominal pain, vomiting, diarrhea or consitpation Genitourinary: Negative for dysuria, urgency, frequency, hematuria Musculoskeletal: Negative for back pain or joint pain Neurological: Negative for dizziness, seizures, syncope, focal weakness,  numbness and headaches.  Hematological: Does not bruise/bleed easily.  Psychiatric/Behavioral: Negative for hallucinations, confusion, dysphoric mood  Tolerating Diet: yes      DRUG ALLERGIES:  No Known Allergies  VITALS:  Blood pressure (!) 182/73, pulse 73, temperature 97.9 F (36.6 C), resp. rate 16, height 6\' 6"  (1.981 m), weight 108.8 kg, SpO2 94 %.  PHYSICAL EXAMINATION:  Constitutional: Appears well-developed and well-nourished. No distress. HENT: Normocephalic. Marland Kitchen Oropharynx is clear and moist.  Eyes: Conjunctivae and EOM are normal. PERRLA, no scleral icterus.  Neck: Normal ROM. Neck supple. No JVD. No tracheal deviation. CVS: RRR, S1/S2 +, no murmurs, no gallops, no carotid bruit.  Pulmonary: Effort and breath sounds normal, no stridor, rhonchi, wheezes, rales.  Abdominal: Soft. BS +,  no distension, tenderness, rebound or guarding.  Musculoskeletal: Normal range of motion. No edema and no tenderness.  Neuro: Alert. CN 2-12 grossly intact. No focal deficits. Skin: wrapped in ace bandage  Psychiatric: Normal mood and  affect.      LABORATORY PANEL:   CBC Recent Labs  Lab 07/07/18 0440  WBC 6.4  HGB 10.9*  HCT 33.8*  PLT 162   ------------------------------------------------------------------------------------------------------------------  Chemistries  Recent Labs  Lab 07/04/18 0423  07/07/18 0440  NA 139   < > 140  K 3.7   < > 3.4*  CL 111   < > 109  CO2 22   < > 23  GLUCOSE 118*   < > 102*  BUN 29*   < > 17  CREATININE 1.26*   < > 0.89  CALCIUM 8.2*   < > 8.2*  AST 75*  --   --   ALT 50*  --   --   ALKPHOS 89  --   --   BILITOT 1.1  --   --    < > = values in this interval not displayed.   ------------------------------------------------------------------------------------------------------------------  Cardiac Enzymes Recent Labs  Lab 07/03/18 1027  TROPONINI 0.05*   ------------------------------------------------------------------------------------------------------------------  RADIOLOGY:  Ct Head Wo Contrast  Result Date: 07/04/2018 CLINICAL DATA:  Altered mental status and fever EXAM: CT HEAD WITHOUT CONTRAST TECHNIQUE: Contiguous axial images were obtained from the base of the skull through the vertex without intravenous contrast. COMPARISON:  None. FINDINGS: Brain: There is age related volume loss. There is no intracranial mass, hemorrhage, extra-axial fluid collection, or midline shift. There is an asymmetric deep sulcus in the medial right temporal lobe, a presumed anatomic variant. There is patchy small vessel disease in the centra semiovale bilaterally. There is evidence of prior infarct in the left temporal lobe. No well-defined acute infarct is appreciable on this study. Vascular: No hyperdense vessel. There is calcification in each distal vertebral artery and carotid siphon region. Skull: Bony  calvarium appears intact. Sinuses/Orbits: There is mucosal thickening in several ethmoid air cells. Other paranasal sinuses are clear. Orbits appear symmetric bilaterally.  Incidental note is made of previous cataract removal on the right. Other: Mastoid air cells are clear. IMPRESSION: Atrophy with periventricular small vessel disease. Prior infarct left temporal lobe. No acute infarct is demonstrated on this study. No mass or hemorrhage. There are foci of arterial vascular calcification. There is mucosal thickening in several ethmoid air cells. Electronically Signed   By: Bretta Bang III M.D.   On: 07/04/2018 13:26   Mr Foot Left W Wo Contrast  Result Date: 07/04/2018 CLINICAL DATA:  Skin ulceration on the plantar surface of the left foot deep to the first metatarsal head in a diabetic patient. History of prior great toe amputation. EXAM: MRI OF THE LEFT FOREFOOT WITHOUT AND WITH CONTRAST TECHNIQUE: Multiplanar, multisequence MR imaging of the left forefoot was performed both before and after administration of intravenous contrast. CONTRAST:  10 cc Gadavist IV COMPARISON:  Plain films left foot 07/03/2018. FINDINGS: Bones/Joint/Cartilage The patient is status post amputation of the great toe. Intense edema and enhancement are seen in approximately the distal 3.5 cm of the first metatarsal and in the medial and lateral sesamoids consistent with osteomyelitis. No other marrow signal abnormality to suggest osteomyelitis is identified. Flattening of the heads of the second and third metatarsals and mild to moderate degenerative change about the second and third MTP joints are noted. No fracture. Ligaments Intact. Muscles and Tendons There is atrophy of intrinsic musculature the foot. No intramuscular fluid collection. Soft tissues Skin ulceration on the plantar surface of the foot deep to the head of the first metatarsal extends almost to bone. IMPRESSION: Findings consistent with osteomyelitis in the distal 3.5 cm of the first metatarsal and in the medial and lateral sesamoids. Negative for abscess or septic joint. Status post amputation of the great toe. 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 Electronically Signed   By: Drusilla Kanner M.D.   On: 07/04/2018 13:08   Dg Chest Port 1 View  Result Date: 07/03/2018 CLINICAL DATA:  Altered mental status, tachycardia. EXAM: PORTABLE CHEST 1 VIEW COMPARISON:  07/05/2014. FINDINGS: Patient is rotated. Trachea is midline. Heart is enlarged, as before. Lungs are low in volume but clear. No pleural fluid. IMPRESSION: No acute findings. Electronically Signed   By: Leanna Battles M.D.   On: 07/03/2018 11:18   Dg Foot Complete Left  Result Date: 07/03/2018 CLINICAL DATA:  Left foot pain. Diabetic ulcer involving the plantar aspect of the foot near the first and second MTP joints. History of prior amputation of the great toe. EXAM: LEFT FOOT - COMPLETE 3+ VIEW COMPARISON:  Radiographs 07/05/2014 FINDINGS: Prior amputation of the great toe. I do not see any obvious destructive bony changes involving the first metatarsal head to suggest osteomyelitis. 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 No definite findings for septic arthritis or osteomyelitis. The mid and hindfoot bony structures are intact. Small vessel calcifications are noted. Diffuse subcutaneous soft tissue swelling/edema suggesting cellulitis. IMPRESSION: No definite plain film findings for osteomyelitis or septic arthritis. MRI may be helpful for further evaluation. 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 Electronically  Signed   By: Rudie Meyer M.D.   On: 07/03/2018 11:20     ASSESSMENT AND PLAN:    72 year old male with history of diabetes, peripheral vascular disease with chronic left foot wound status post great toe  amputation who presented to the ER with fever and confusion.  1.  Acute metabolic encephalopathy in the setting of sepsis with bacteremia due to infected foot: Mental status has improved and patient is at baseline. CT head showed no acute infarct   2.  Sepsis with group strep C species bacteremia from Findings consistent with osteomyelitis in the distal 3.5 cm of the first metatarsal and in the medial and lateral sesamoids.  He is postoperative day #2 for first metatarsal resection Dietary to see patient today for dressing change. I will discuss case with ID as for IV antibiotics, PICC line and duration of antibiotics.  Therapy is recommending home with home health  3.  Hypertensive urgency: Pressure still elevated despite increased dose of metoprolol and therefore have added Norvasc.   4.  Acute kidney injury in the setting of sepsis which has improved  5.  Diabetes: Continue current regimen with ADA diet and sliding scale  6.  Staph aureus urinary tract infection: To new current antibiotics   management plans discussed with the patient and family and they are in agreement.  CODE STATUS: full  TOTAL TIME TAKING CARE OF THIS PATIENT: 24 minutes.     POSSIBLE D/C tomorrow with home health, DEPENDING ON CLINICAL CONDITION.   Breckon Reeves M.D on 07/07/2018 at 12:10 PM  Between 7am to 6pm - Pager - [phone] After 6pm go to [email or web address] - password [email or web address]  [facility] [place] [facility]  Office  [phone]  CC: Primary care physician; Lynnea Ferrier, MD  Note: This dictation was prepared with Dragon dictation along with smaller phrase technology. Any transcriptional errors that result from this process are unintentional.

## 2018-07-07 NOTE — Progress Notes (Signed)
IV antibiotics continue,PT evaluation,vital signs within normal limits

## 2018-07-07 NOTE — Progress Notes (Signed)
SNF Benefits Check:  Number called: [phone] Representative: [place] Reference Number: [id number]  Patient has active [place] active since 09/24/16 with no current term date.   Individual deductible is $2,850, of which $2398.91 met so far.  Individual out-of-pocket max is $6,550, of which $2,426.35 met so far.    SNF Benefit: Responsible for 30% co-insurance.  Limited to 120 days in/out of network.  Auth required at [id number].    Per chart, patient also has Medicare Part A coverage.  Check in [employer] confirms active Part A coverage.  Unable to determine if Medicare Part A considered primary.  Per [employer] rep, patient would need to contact their member services to update coverage on file to determine primary coverage status. Per [employer], nothing shows as being accumulated towards deductible amount.

## 2018-07-07 NOTE — Care Management Note (Signed)
Case Management Note  Patient Details  Name: Tyler [name] MRN: [id number] Date of Birth: [birth year]  Subjective/Objective:     Patient from home.  Lives with an elderly friend and has 2 flights of stairs to his house.  He was found in his home on the ground and confused.  Daughter Tyler [name] [phone], states that he is very private and his roommate works night shift.  She has never been to his house before.  When he had his toe amputated 4 years ago he went home on IV antibiotics via a PICC line and home health nurse.  She cannot remember the home health agency and does not have a preference.  She states he has high blood pressure but does not take his medications as prescribed because he feels they will be harmful to him.  He currently has high blood pressure.  He does not control his blood sugars either.  Daughter states he has not been OOB since he got here on Thursday.  Spoke with Dr. Juliene [name] and the plan will most likely be home with IV antibiotics/PICC, home health.                         Action/Plan: Asked MD for PT consult and placed order.   Heads up referral made to [facility] for RN, PT as patient does not have a preference of agency.   Expected Discharge Date:                  Expected Discharge Plan:  Home w Home Health Services  In-House Referral:     Discharge planning Services  CM Consult  Post Acute Care Choice:    Choice offered to:     DME Arranged:    DME Agency:     HH Arranged:  RN, PT, OT, Nurse's Aide, IV Antibiotics HH Agency:     Status of Service:     If discussed at Long Length of Stay Meetings, dates discussed:    Additional Comments:  Tyler Kerns, RN 07/07/2018, 9:47 AM

## 2018-07-07 NOTE — Progress Notes (Signed)
Awaiting pathology results. Still pending. Will see tomorrow.

## 2018-07-07 NOTE — Evaluation (Signed)
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 PMH: DM, Left hallux disarticulartion.    Clinical Impression  Pt admitted with above diagnosis. Pt currently with functional limitations due to the deficits listed below (see "PT Problem List"). Upon entry, pt in bed, daughter present. Pt reports urgency of bowel and facilitates self to bed pan, then calls for NA to assist with clean up. Pt denies c/o pain. Pt c/o of sinus drainage, difficult breathing due to afformentioned, and attributes some lightheadedness to this as well once seated. More in depth assessment of orthostatic vitals warranted. Pt is quite tall with predictable difficulty rising from standard height surfaces, especially in the context of LLE NWB, but is able to do so from elevated surface. Pt lacks sufficient strength for NWB AMB with standard walker, but has good potential to develop this over the next 1-2 weeks. Functional mobility assessment demonstrates increased effort/time requirements, poor tolerance, and need for physical assistance, whereas the patient performed these at a higher level of independence PTA. Pt has 4 steps to enter daughters home, but will have SIL to assist at egress, and he describes these as partial steps ~4-inch height. Pt will benefit from skilled PT intervention to increase independence and safety with basic mobility in preparation for discharge to the venue listed below.       Follow Up Recommendations Home health PT    Equipment Recommendations  Other (comment)(tall-sized standard walker (no wheels!) )    Recommendations for Other Services       Precautions / Restrictions  Precautions Precautions: Fall Restrictions Weight Bearing Restrictions: (none seen; assumptive NWB for gait and stransfer until additional clarification is obtained. )      Mobility  Bed Mobility Overal bed mobility: Modified Independent             General bed mobility comments: No additional time and effort required, no physical assistance needed.   Transfers Overall transfer level: Needs assistance Equipment used: Standard walker Transfers: Sit to/from Stand Sit to Stand: From elevated surface         General transfer comment: No additional time and effort required, no physical assistance needed. VC to observe NWB.  Ambulation/Gait Ambulation/Gait assistance: Min guard Gait Distance (Feet): 36 Feet Assistive device: Standard walker       General Gait Details: attempted to teach RLE hop-to gait with walker, but patient lacks strength in arms/legs (he cites 'coordination' issues which seems inaccurate); Pt reverse to a LLE PWB 3-point gait with standard walker. (reports to feel lightheaded with activity, would benefit from closer BP assessment. )  Stairs            Wheelchair Mobility    Modified Rankin (Stroke Patients Only)       Balance Overall balance assessment: Needs assistance(unable to balance with LLE floating while moving. )         Standing balance support: Bilateral upper extremity supported;During functional activity Standing balance-Leahy Scale: Poor                               Pertinent Vitals/Pain Pain Assessment: No/denies pain    Home Living  Family/patient expects to be discharged to:: Private residence(daughter's home [place] ) Living Arrangements: Children([place], SIL, grandchildren ) Available Help at Discharge: Family Type of Home: House Home Access: Stairs to enter Entrance Stairs-Rails: Can reach both Entrance Stairs-Number of Steps: 4 Home Layout: One level Home Equipment: Cane - single  point      Prior Function Level of Independence: Independent         Comments: still works at [employer], no balance restrictions.      Hand Dominance   Dominant Hand: Right    Extremity/Trunk Assessment   Upper Extremity Assessment Upper Extremity Assessment: Generalized weakness    Lower Extremity Assessment Lower Extremity Assessment: Generalized weakness       Communication   Communication: No difficulties  Cognition Arousal/Alertness: Awake/alert Behavior During Therapy: WFL for tasks assessed/performed Overall Cognitive Status: Within Functional Limits for tasks assessed                                        General Comments      Exercises     Assessment/Plan    PT Assessment Patient needs continued PT services  PT Problem List Decreased strength;Decreased activity tolerance;Decreased balance;Decreased mobility       PT Treatment Interventions DME instruction;Balance training;Gait training;Stair training;Functional mobility training;Therapeutic activities;Therapeutic exercise;Patient/family education    PT Goals (Current goals can be found in the Care Plan section)  Acute Rehab PT Goals Patient Stated Goal: return to home with help from family  PT Goal Formulation: With patient Time For Goal Achievement: 07/21/18 Potential to Achieve Goals: Good    Frequency 7X/week   Barriers to discharge        Co-evaluation               AM-PAC PT "6 Clicks" Daily Activity  Outcome Measure Difficulty turning over in bed (including adjusting bedclothes, sheets and blankets)?: A Little Difficulty moving from lying on back to sitting on the side of the bed? : A Little Difficulty sitting down on and standing up from a chair with arms (e.g., wheelchair, bedside commode, etc,.)?: Unable Help needed moving to and from a bed to chair (including a wheelchair)?: Total Help needed walking in hospital room?: A Lot Help needed climbing 3-5  steps with a railing? : A Lot 6 Click Score: 12    End of Session Equipment Utilized During Treatment: Gait belt Activity Tolerance: Patient tolerated treatment well;Patient limited by fatigue;Treatment limited secondary to medical complications (Comment)(increaseing dizziness, and increased urgency of bowel voiding. ) Patient left: in chair;with call bell/phone within reach(feet elevated ) Nurse Communication: Other (comment)(pt having a bowel movement in bed. ) PT Visit Diagnosis: Unsteadiness on feet (R26.81);Other abnormalities of gait and mobility (R26.89)    Time: 1517-6160 PT Time Calculation (min) (ACUTE ONLY): 29 min   Charges:   PT Evaluation $PT Eval Low Complexity: 1 Low PT Treatments $Gait Training: 8-22 mins        11:37 AM, 07/07/18 Etta Grandchild, PT, DPT Physical Therapist - [place] [phone]    [place] C 07/07/2018, 11:31 AM

## 2018-07-08 LAB — GLUCOSE, CAPILLARY
Glucose-Capillary: 130 mg/dL — ABNORMAL HIGH (ref 70–99)
Glucose-Capillary: 150 mg/dL — ABNORMAL HIGH (ref 70–99)

## 2018-07-08 MED ORDER — AMLODIPINE BESYLATE 10 MG PO TABS: 10.0000 mg | ORAL_TABLET | Freq: Every day | ORAL | 0 refills | Status: DC

## 2018-07-08 MED ORDER — METOPROLOL TARTRATE 100 MG PO TABS: 100.0000 mg | ORAL_TABLET | Freq: Two times a day (BID) | ORAL | 0 refills | Status: DC

## 2018-07-08 MED ORDER — AMLODIPINE BESYLATE 10 MG PO TABS
10.0000 mg | ORAL_TABLET | Freq: Every day | ORAL | Status: DC
  Administered 2018-07-08: 10 mg via ORAL
  Filled 2018-07-08: qty 1

## 2018-07-08 MED ORDER — AMOXICILLIN-POT CLAVULANATE 875-125 MG PO TABS: 1.0000 | ORAL_TABLET | Freq: Two times a day (BID) | ORAL | 0 refills | Status: DC

## 2018-07-08 MED ORDER — MUPIROCIN CALCIUM 2 % EX CREA: TOPICAL_CREAM | Freq: Every day | CUTANEOUS | 0 refills | Status: DC

## 2018-07-08 NOTE — Discharge Summary (Signed)
[facility] [place] [facility]   PATIENT NAME: Tyler [name]    MR#:  [id number]  DATE OF BIRTH:  May 28, 1946  DATE OF ADMISSION:  07/03/2018 ADMITTING PHYSICIAN: Ramonita Lab, MD  DATE OF DISCHARGE: 07/08/2018  PRIMARY CARE PHYSICIAN: Curtis Sites III, MD    ADMISSION DIAGNOSIS:  Lactic acidosis [E87.2] SIRS (systemic inflammatory response syndrome) (HCC) [R65.10] Cellulitis of left lower extremity [L03.116]  DISCHARGE DIAGNOSIS:  Active Problems:   Sepsis (HCC)   SECONDARY DIAGNOSIS:   Past Medical History:  Diagnosis Date  . Arthritis   . Diabetes mellitus without complication (HCC)   . 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  1.  Acute metabolic encephalopathy in the setting of sepsis with bacteremia due to infected foot: Mental status  is at baseline. CT head showed no acute infarct   2.  Sepsis with group strep C species bacteremia from Findings consistent with osteomyelitis in the distal 3.5 cm of the first metatarsal and in the medial and lateral sesamoids.  He is postoperative day #3 for first metatarsal resection I spoke with ID consultant.  Patient will need oral Augmentin for 1 week.  He will follow-up with podiatry in 1 week.  He will continue postop shoe while ambulatory and will need home health for dressing changes.  Awaiting surgical pathology to see if proximal bone margin is clear   3.    Accelerated hypertension:  Patient is started on metoprolol and Norvasc.  He needs close follow-up with his PCP. 4.  Acute kidney injury in the setting of sepsis which has improved  5.  Diabetes: He will continue with outpatient oral medications and ADA diet  6.  Staph aureus urinary tract infection: This was treated   DISCHARGE CONDITIONS  AND DIET:   Stable for discharge on heart healthy diabetic diet  CONSULTS OBTAINED:  Treatment Team:  Recardo Evangelist, [name] Ginnie Smart, MD  DRUG ALLERGIES:  No Known Allergies  DISCHARGE MEDICATIONS:   Allergies as of 07/08/2018   No Known Allergies     Medication List    TAKE these medications   amLODipine 10 MG tablet Commonly known as:  NORVASC Take 1 tablet (10 mg total) by mouth daily. Start taking on:  07/09/2018   amoxicillin-clavulanate 875-125 MG tablet Commonly known as:  AUGMENTIN Take 1 tablet by mouth 2 (two) times daily.   glipiZIDE 10 MG tablet Commonly known as:  GLUCOTROL Take 10 mg by mouth 2 (two) times daily.   metFORMIN 1000 MG tablet Commonly known as:  GLUCOPHAGE Take 1,000 mg by mouth 2 (two) times daily.   metoprolol tartrate 100 MG tablet Commonly known as:  LOPRESSOR Take 1 tablet (100 mg total) by mouth 2 (two) times daily.   multivitamin tablet Take 1 tablet by mouth daily.   mupirocin cream 2 % Commonly known as:  BACTROBAN Apply topically daily.            Durable Medical Equipment  (From admission, onward)         Start     Ordered   07/08/18 1008  For home use only DME Dan Humphreys  [facility])  Once    Question:  Patient needs a walker to treat with the following condition  Answer:  Osteomyelitis (HCC)  07/08/18 1008            Today   CHIEF COMPLAINT:  Patient is ready for discharge today Dressing change this morning by Dr. Ether Griffins   VITAL SIGNS:  Blood pressure (!) 189/82, pulse 85, temperature 98.5 F (36.9 C), temperature source Oral, resp. rate (!) 22, height 6\' 6"  (1.981 m), weight 108.8 kg, SpO2 97 %.   REVIEW OF SYSTEMS:  Review of Systems  Constitutional: Negative.  Negative for chills, fever and malaise/fatigue.  HENT: Negative.  Negative for ear discharge, ear pain, hearing loss, nosebleeds and sore throat.   Eyes: Negative.  Negative for blurred vision and pain.  Respiratory:  Negative.  Negative for cough, hemoptysis, shortness of breath and wheezing.   Cardiovascular: Negative.  Negative for chest pain, palpitations and leg swelling.  Gastrointestinal: Negative.  Negative for abdominal pain, blood in stool, diarrhea, nausea and vomiting.  Genitourinary: Negative.  Negative for dysuria.  Musculoskeletal: Negative.  Negative for back pain.  Skin: Negative.   Neurological: Negative for dizziness, tremors, speech change, focal weakness, seizures and headaches.  Endo/Heme/Allergies: Negative.  Does not bruise/bleed easily.  Psychiatric/Behavioral: Negative.  Negative for depression, hallucinations and suicidal ideas.     PHYSICAL EXAMINATION:  GENERAL:  72 y.o.-year-old patient lying in the bed with no acute distress.  NECK:  Supple, no jugular venous distention. No thyroid enlargement, no tenderness.  LUNGS: Normal breath sounds bilaterally, no wheezing, rales,rhonchi  No use of accessory muscles of respiration.  CARDIOVASCULAR: S1, S2 normal. No murmurs, rubs, or gallops.  ABDOMEN: Soft, non-tender, non-distended. Bowel sounds present. No organomegaly or mass.  EXTREMITIES: No pedal edema, cyanosis, or clubbing.  PSYCHIATRIC: The patient is alert and oriented x 3.  SKIN: left foot dressed  DATA REVIEW:   CBC Recent Labs  Lab 07/07/18 0440  WBC 6.4  HGB 10.9*  HCT 33.8*  PLT 162    Chemistries  Recent Labs  Lab 07/04/18 0423  07/07/18 0440  NA 139   < > 140  K 3.7   < > 3.4*  CL 111   < > 109  CO2 22   < > 23  GLUCOSE 118*   < > 102*  BUN 29*   < > 17  CREATININE 1.26*   < > 0.89  CALCIUM 8.2*   < > 8.2*  AST 75*  --   --   ALT 50*  --   --   ALKPHOS 89  --   --   BILITOT 1.1  --   --    < > = values in this interval not displayed.    Cardiac Enzymes Recent Labs  Lab 07/03/18 1027  TROPONINI 0.05*    Microbiology Results  @MICRORSLT48 @  RADIOLOGY:  No results found.    Allergies as of 07/08/2018   No Known Allergies      Medication List    TAKE these medications   amLODipine 10 MG tablet Commonly known as:  NORVASC Take 1 tablet (10 mg total) by mouth daily. Start taking on:  07/09/2018   amoxicillin-clavulanate 875-125 MG tablet Commonly known as:  AUGMENTIN Take 1 tablet by mouth 2 (two) times daily.   glipiZIDE 10 MG tablet Commonly known as:  GLUCOTROL Take 10 mg by mouth 2 (two) times daily.   metFORMIN 1000 MG tablet Commonly known as:  GLUCOPHAGE Take 1,000 mg by mouth 2 (two) times daily.   metoprolol tartrate 100 MG tablet Commonly known as:  LOPRESSOR Take 1  tablet (100 mg total) by mouth 2 (two) times daily.   multivitamin tablet Take 1 tablet by mouth daily.   mupirocin cream 2 % Commonly known as:  BACTROBAN Apply topically daily.            Durable Medical Equipment  (From admission, onward)         Start     Ordered   07/08/18 1008  For home use only DME Dan Humphreys  [facility])  Once    Question:  Patient needs a walker to treat with the following condition  Answer:  Osteomyelitis (HCC)   07/08/18 1008             Management plans discussed with the patient and he is in agreement. Stable for discharge   Patient should follow up with pcp  CODE STATUS:     Code Status Orders  (From admission, onward)         Start     Ordered   07/03/18 1411  Full code  Continuous     07/03/18 1410        Code Status History    This patient has a current code status but no historical code status.      TOTAL TIME TAKING CARE OF THIS PATIENT: 38 minutes.    Note: This dictation was prepared with Dragon dictation along with smaller phrase technology. Any transcriptional errors that result from this process are unintentional.  Keri Veale M.D on 07/08/2018 at 10:10 AM  Between 7am to 6pm - Pager - [phone] After 6pm go to [email or web address] - password [email or web address]  [facility]  Office  [phone]  CC: Primary care physician; Lynnea Ferrier, MD

## 2018-07-08 NOTE — Progress Notes (Signed)
Daily Progress Note   Subjective  - 3 Days Post-Op  F/u left foot surgery.  Objective Vitals:   07/07/18 0657 07/07/18 0751 07/07/18 2007 07/08/18 0426  BP: (!) 167/72 (!) 182/73 (!) 177/81 (!) 172/79  Pulse: 87 73 83 73  Resp:  16 20 (!) 22  Temp:  97.9 F (36.6 C) 98.2 F (36.8 C) 98.3 F (36.8 C)  TempSrc:   Oral Oral  SpO2:  94% 96% 95%  Weight:      Height:        Physical Exam: Wound is stable.  NO purulence or erythema  Laboratory CBC    Component Value Date/Time   WBC 6.4 07/07/2018 0440   HGB 10.9 (L) 07/07/2018 0440   HGB 11.1 (L) 07/09/2014 0452   HCT 33.8 (L) 07/07/2018 0440   HCT 34.3 (L) 07/09/2014 0452   PLT 162 07/07/2018 0440   PLT 358 07/09/2014 0452    BMET    Component Value Date/Time   NA 140 07/07/2018 0440   NA 142 07/10/2014 0549   K 3.4 (L) 07/07/2018 0440   K 3.7 07/10/2014 0549   CL 109 07/07/2018 0440   CL 109 (H) 07/10/2014 0549   CO2 23 07/07/2018 0440   CO2 24 07/10/2014 0549   GLUCOSE 102 (H) 07/07/2018 0440   GLUCOSE 134 (H) 07/10/2014 0549   BUN 17 07/07/2018 0440   BUN 10 07/10/2014 0549   CREATININE 0.89 07/07/2018 0440   CREATININE 1.26 07/10/2014 0549   CALCIUM 8.2 (L) 07/07/2018 0440   CALCIUM 8.3 (L) 07/10/2014 0549   GFRNONAA >60 07/07/2018 0440   GFRNONAA >60 07/10/2014 0549   GFRAA >60 07/07/2018 0440   GFRAA >60 07/10/2014 0549    Assessment/Planning: Osteomyelitis s/p 1st ray amp   Home health Dressings:  Flush wound with saline and apply bulky gauze dressing 3 x's per week.  Use ortho-wedge shoe while ambulatory.  F/u with podiatry in 1 week.  Awaiting surgical pathology to see if proximal bone margin is clear.  Gwyneth Revels A  07/08/2018, 8:12 AM

## 2018-07-08 NOTE — Care Management (Addendum)
RNCM has notified [facility] that patient is discharging to home today. She will be on PO antibiotics.Update: Patient will be going to daughter Tyler [name]'s house [address] [place] [address] telephone [phone].  Tall walker ordered from [place]. Daughter Tyler [name] is concerned about him driving to her her home. Patient states he is ready to discharge and will have his roommate Tyler [name] drive him to Tyler [name]'s address. [facility] has accepted this patient.

## 2018-07-08 NOTE — Progress Notes (Signed)
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 PMH: DM, Left hallux disarticulartion.      PT Comments    Pt modified independent semi-supine to sit and CGA with transfers from elevated bed (d/t pt being very tall) and from recliner with use of RW.  Pt incontinent of bowel upon standing from bed requiring clean-up (NT came to assist with clean-up and pt did well standing for clean-up with SBA and use of RW).  Pt transferred to chair and BP checked (161/90).  Pt agreeable to walking but once close to bathroom door pt requesting to urinate (pt unable to make it to toilet in time and was incontinent of urine requiring clean-up).  Pt did do well finishing urinating standing at toilet no UE support with SBA and also steady washing hands at sink.  Pt then ambulated back to chair to allow for further clean-up.  Overall pt did well ambulating with RW, maintaining PWB'ing status, performing step to gait pattern with ortho-wedge shoe, and was steady without any loss of balance during session's activities.  Further ambulation deferred d/t time required for multiple clean-ups during session (pt ambulated 20 feet to toilet and then 20 feet back to chair (without sitting to rest) with RW and orthowedge shoe but pt appears to be able to walk further).  BP 156/87 end of session resting in chair.  Will continue to progress pt with strengthening and progress ambulation distance next session.   Follow Up Recommendations  Home health PT     Equipment Recommendations  (Tall sized rolling walker with 5" wheels)    Recommendations for Other Services       Precautions / Restrictions  Precautions Precautions: Fall Restrictions Weight Bearing Restrictions: Yes LLE Weight Bearing: Partial weight bearing Other Position/Activity Restrictions: PWB'ing L LE with orthowedge shoe    Mobility  Bed Mobility Overal bed mobility: Modified Independent             General bed mobility comments: Semi-supine to sit without any noted difficulties.  Transfers Overall transfer level: Needs assistance Equipment used: Rolling walker (2 wheeled) Transfers: Sit to/from Stand Sit to Stand: Min guard         General transfer comment: x2 trials from elevated bed (d/t pt being tall) and x1 trial from recliner; initial vc's for L LE placement and UE placement and then no further cueing required; increased effort to come to full upright posture standing from recliner but no assist required  Ambulation/Gait Ambulation/Gait assistance: Min guard Gait Distance (Feet): 40 Feet Assistive device: Rolling walker (2 wheeled) Gait Pattern/deviations: Step-to pattern Gait velocity: decreased   General Gait Details: initial cueing for PWB'ing status, gait pattern, and walker use and then no further cueing required; steady with RW   Stairs             Wheelchair Mobility    Modified Rankin (Stroke Patients Only)       Balance Overall balance assessment: Needs assistance Sitting-balance support: No upper extremity supported;Feet supported Sitting balance-Leahy Scale: Normal Sitting balance - Comments: steady sitting reaching outside BOS   Standing balance support: No upper extremity supported Standing balance-Leahy Scale: Good Standing balance comment: steady standing washing  hands at sink and toileting                            Cognition Arousal/Alertness: Awake/alert Behavior During Therapy: WFL for tasks assessed/performed Overall Cognitive Status: Within Functional Limits for tasks assessed                                         Exercises      General Comments General comments (skin integrity, edema, etc.): L foot dressings in place.  Nursing cleared pt for participation in physical therapy.  Pt agreeable to PT session.      Pertinent Vitals/Pain Pain Assessment: No/denies pain  HR WFL during session.    Home Living                      Prior Function            PT Goals (current goals can now be found in the care plan section) Acute Rehab PT Goals Patient Stated Goal: return to home with help from family  PT Goal Formulation: With patient Time For Goal Achievement: 07/21/18 Potential to Achieve Goals: Good Progress towards PT goals: Progressing toward goals    Frequency    7X/week      PT Plan Current plan remains appropriate    Co-evaluation              AM-PAC PT "6 Clicks" Daily Activity  Outcome Measure  Difficulty turning over in bed (including adjusting bedclothes, sheets and blankets)?: A Little Difficulty moving from lying on back to sitting on the side of the bed? : A Little Difficulty sitting down on and standing up from a chair with arms (e.g., wheelchair, bedside commode, etc,.)?: Unable Help needed moving to and from a bed to chair (including a wheelchair)?: A Little Help needed walking in hospital room?: A Little Help needed climbing 3-5 steps with a railing? : A Little 6 Click Score: 16    End of Session Equipment Utilized During Treatment: Gait belt Activity Tolerance: Patient tolerated treatment well Patient left: in chair;with call bell/phone within reach;with chair alarm set;Other (comment)(B LE's elevated via pillows) Nurse Communication: Mobility status;Precautions;Other (comment)(Pt's multiple episodes of incontinence) PT Visit Diagnosis: Unsteadiness on feet (R26.81);Other abnormalities of gait and mobility (R26.89)     Time: 4742-5956 PT Time Calculation (min) (ACUTE ONLY): 72 min  Charges:  $Gait Training: 8-22 mins $Therapeutic Exercise:  8-22 mins $Therapeutic Activity: 23-37 mins                    Leitha Bleak, PT 07/08/18, 12:18 PM [phone]

## 2018-07-08 NOTE — Plan of Care (Signed)

## 2018-07-09 LAB — AEROBIC/ANAEROBIC CULTURE (SURGICAL/DEEP WOUND)

## 2018-07-09 LAB — AEROBIC/ANAEROBIC CULTURE W GRAM STAIN (SURGICAL/DEEP WOUND)

## 2018-07-09 LAB — SURGICAL PATHOLOGY

## 2018-07-11 LAB — CULTURE, BLOOD (ROUTINE X 2)
Culture: NO GROWTH
Culture: NO GROWTH
Special Requests: ADEQUATE
Special Requests: ADEQUATE

## 2018-07-11 LAB — AEROBIC/ANAEROBIC CULTURE W GRAM STAIN (SURGICAL/DEEP WOUND): Culture: NO GROWTH

## 2018-07-11 LAB — AEROBIC/ANAEROBIC CULTURE (SURGICAL/DEEP WOUND): GRAM STAIN: NONE SEEN

## 2018-07-15 LAB — BLOOD GAS, VENOUS
PATIENT TEMPERATURE: 37
pCO2, Ven: 30 mmHg — ABNORMAL LOW (ref 44.0–60.0)
pH, Ven: 7.38 (ref 7.250–7.430)

## 2019-01-12 ENCOUNTER — Other Ambulatory Visit (INDEPENDENT_AMBULATORY_CARE_PROVIDER_SITE_OTHER): Payer: Self-pay | Admitting: Podiatry

## 2019-01-12 ENCOUNTER — Other Ambulatory Visit: Payer: Self-pay

## 2019-01-12 ENCOUNTER — Ambulatory Visit (INDEPENDENT_AMBULATORY_CARE_PROVIDER_SITE_OTHER): Payer: BLUE CROSS/BLUE SHIELD

## 2019-01-12 DIAGNOSIS — M79604 Pain in right leg: Secondary | ICD-10-CM | POA: Diagnosis not present

## 2019-10-07 IMAGING — MR MR FOOT*L* WO/W CM
9 series · 39 of 40 positions shown · IV contrast (gadavist)
Comparison: Plain films left foot 07/03/2018.

CLINICAL DATA: Skin ulceration on the plantar surface of the left
foot deep to the first metatarsal head in a diabetic patient.
History of prior great toe amputation.

EXAM:
MRI OF THE LEFT FOREFOOT WITHOUT AND WITH CONTRAST
TECHNIQUE: Multiplanar, multisequence MR imaging of the left forefoot was
performed both before and after administration of intravenous
contrast.
CONTRAST:  10 cc Gadavist IV

[Series 4: T1 · coronal · 3.0mm · 0.47mm/px · 5 of 40 slices shown (1 of 2)]
[im 1/40]
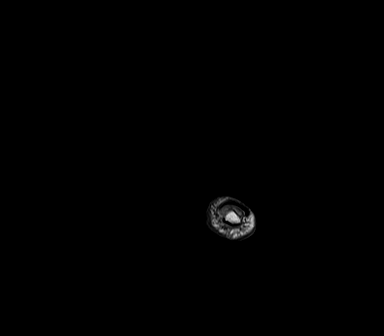
[im 10/40]
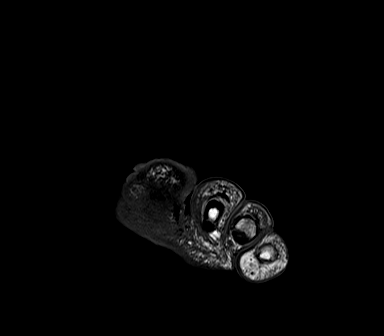
[im 20/40]
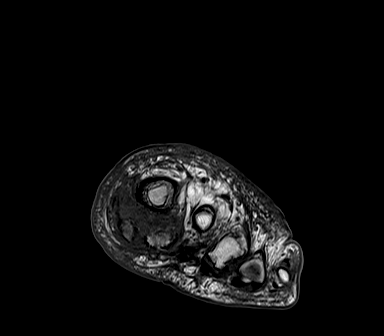
[im 30/40]
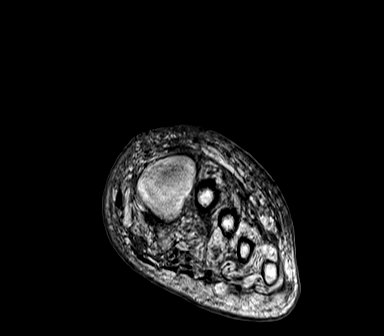
[im 40/40]
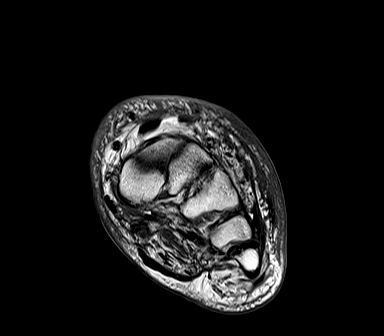

[Series 5: T2 fat-sat · coronal · 3.0mm · 0.47mm/px · 5 of 40 slices shown (1 of 2)]
[im 1/40]
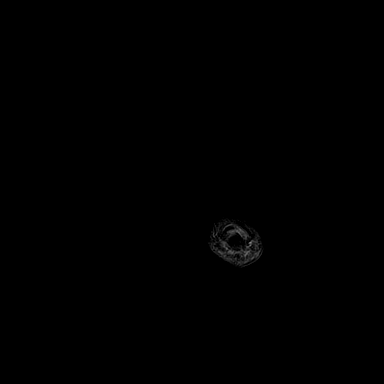
[im 10/40]
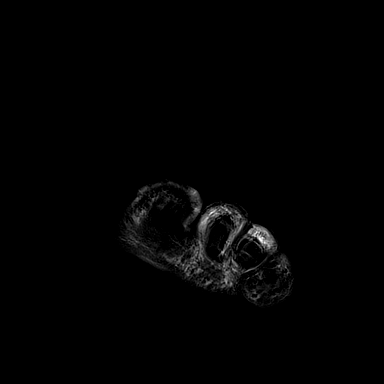
[im 20/40]
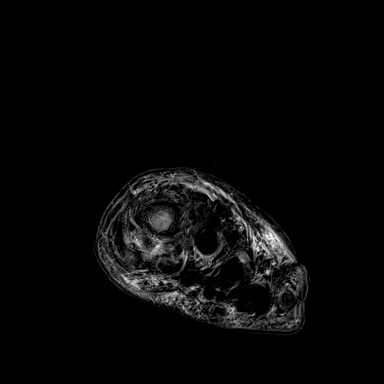
[im 30/40]
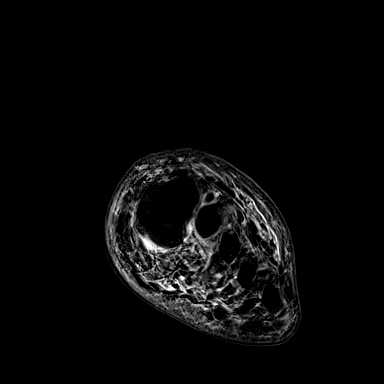
[im 40/40]
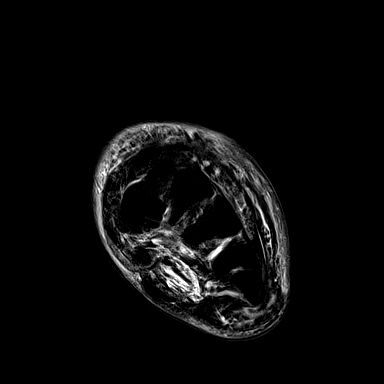

[Series 6: T1 · axial · 3.0mm · 0.52mm/px · z∈[-58,+65]mm · 4 of 34 slices shown (2 of 2)]
[im 1/34]
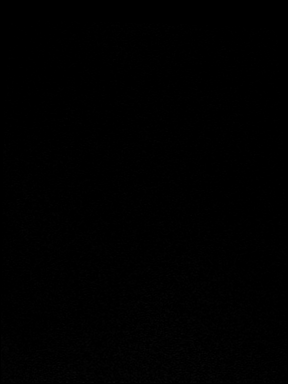
[im 12/34]
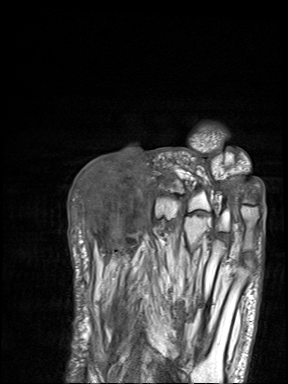
[im 23/34]
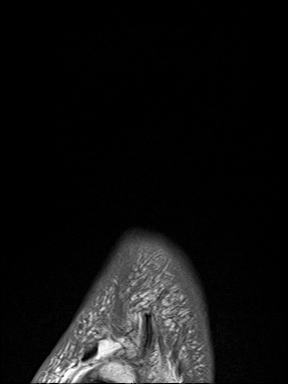
[im 34/34]
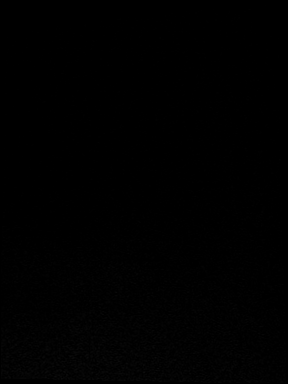

[Series 8: STIR · sagittal · 3.0mm · 0.70mm/px · 3 of 32 slices shown]
[im 1/32]
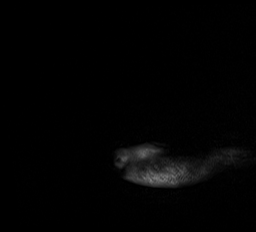
[im 11/32]
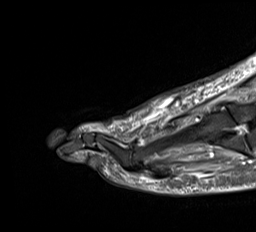
[im 21/32]
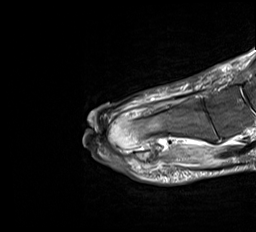

[Series 9: T1 fat-sat · coronal · non-contrast · 3.0mm · 0.59mm/px · 5 of 44 slices shown (1 of 3)]
[im 1/44]
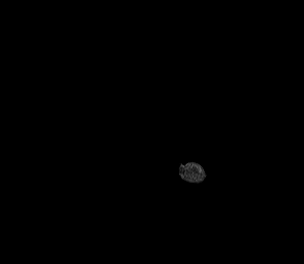
[im 11/44]
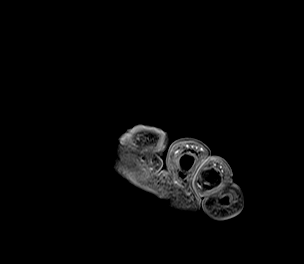
[im 22/44]
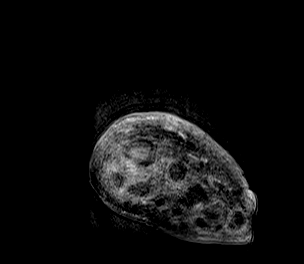
[im 33/44]
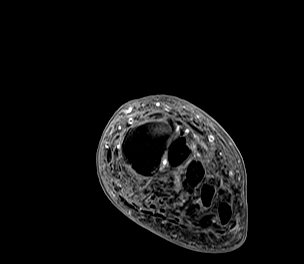
[im 44/44]
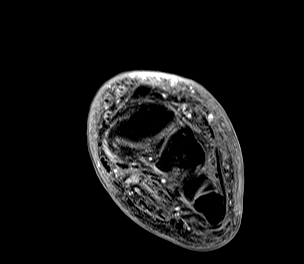

[Series 10: T1 fat-sat post-contrast · coronal · 3.0mm · 0.59mm/px · 5 of 44 slices shown]
[im 1/44]
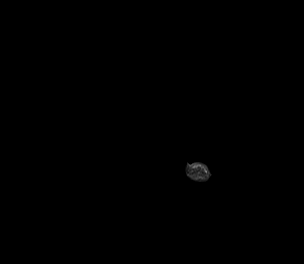
[im 11/44]
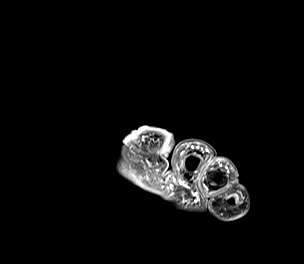
[im 22/44]
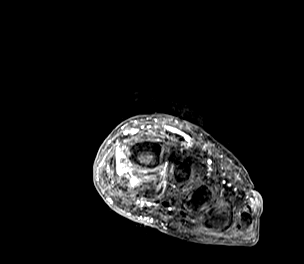
[im 33/44]
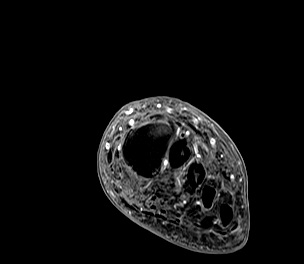
[im 44/44]
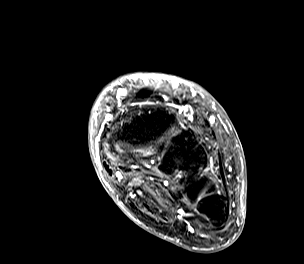

[Series 11: T1 fat-sat · sagittal · 3.0mm · 0.59mm/px · 4 of 34 slices shown (2 of 3)]
[im 1/34]
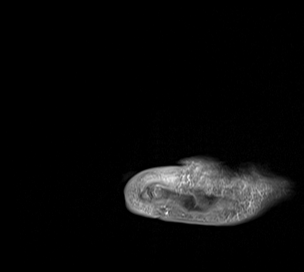
[im 12/34]
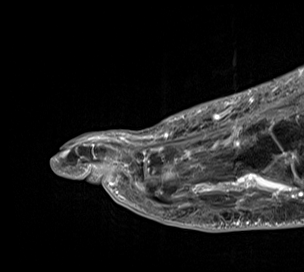
[im 23/34]
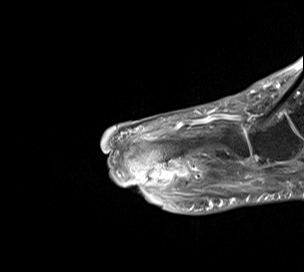
[im 34/34]
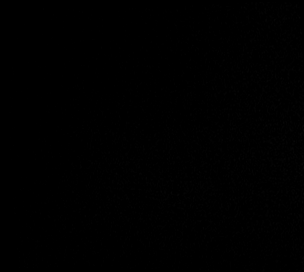

[Series 12: T1 fat-sat · axial · 3.0mm · 0.62mm/px · z∈[-58,+65]mm · 4 of 34 slices shown (3 of 3)]
[im 1/34]
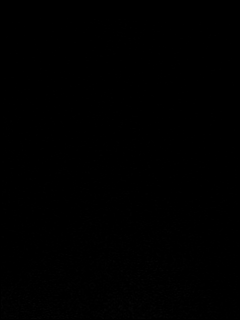
[im 12/34]
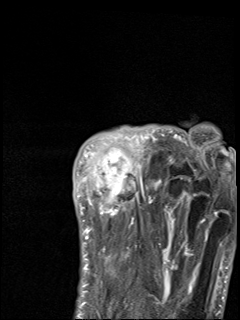
[im 23/34]
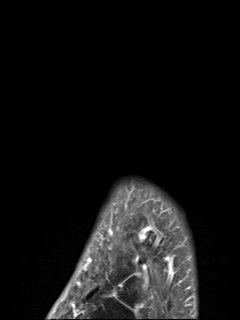
[im 34/34]
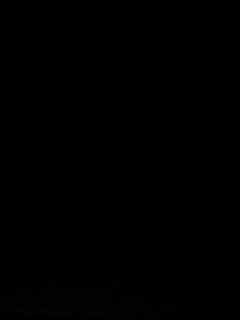

[Series 1015: T2 fat-sat · axial · 3.0mm · 0.29mm/px · z∈[-58,+65]mm · 4 of 34 slices shown (2 of 2)]
[im 1/34]
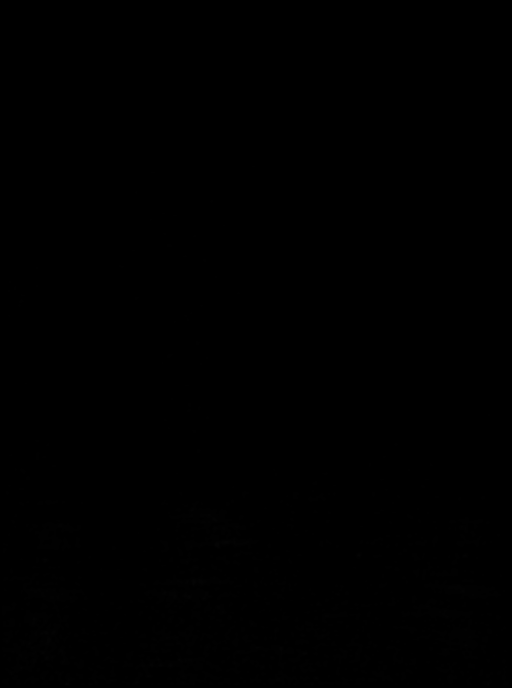
[im 12/34]
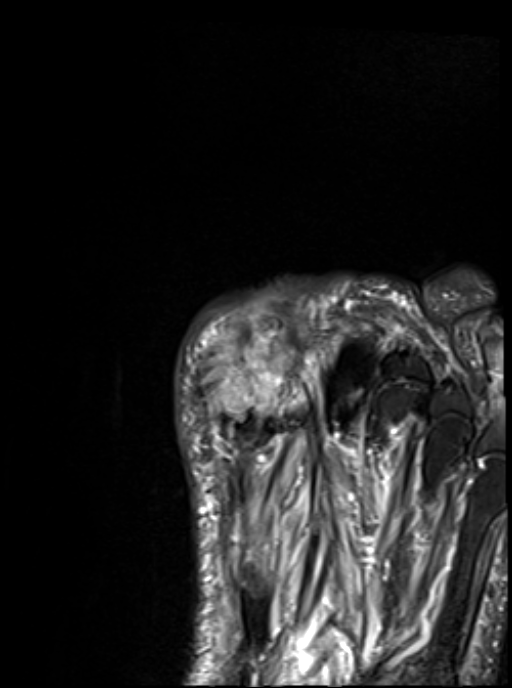
[im 23/34]
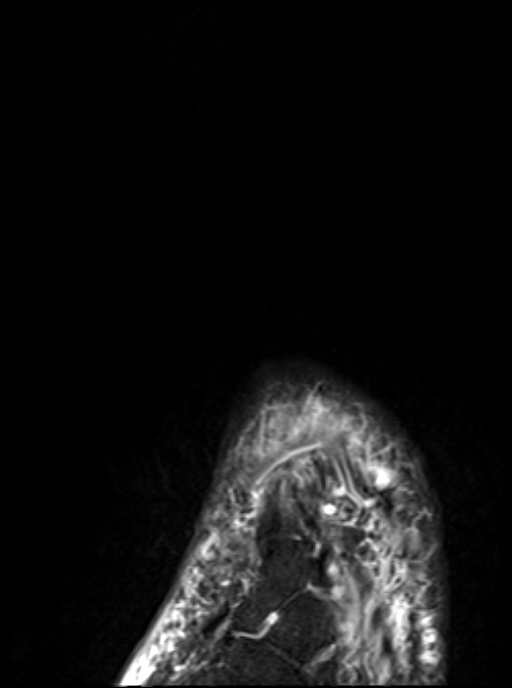
[im 34/34]
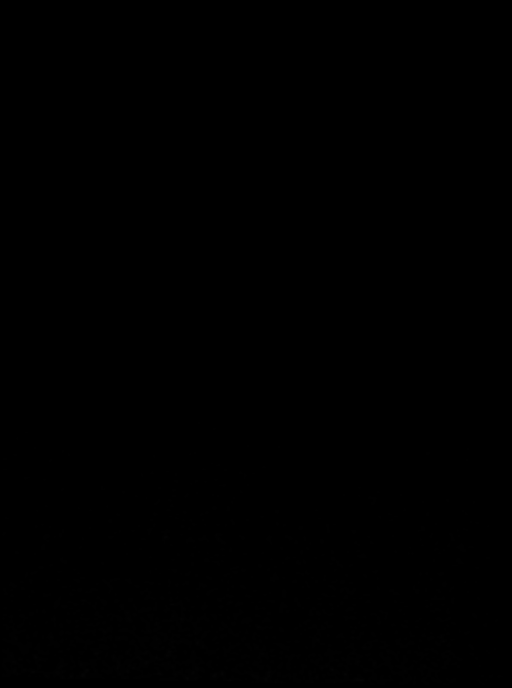

[39 of 40 positions shown; findings below may reference images not displayed]

FINDINGS: Bones/Joint/Cartilage

The patient is status post amputation of the great toe. Intense
edema and enhancement are seen in approximately the distal 3.5 cm of
the first metatarsal and in the medial and lateral sesamoids
consistent with osteomyelitis. No other marrow signal abnormality to
suggest osteomyelitis is identified.

Flattening of the heads of the second and third metatarsals and mild
to moderate degenerative change about the second and third MTP
joints are noted. No fracture.

Ligaments

Intact.

Muscles and Tendons

There is atrophy of intrinsic musculature the foot. No intramuscular
fluid collection.

Soft tissues

Skin ulceration on the plantar surface of the foot deep to the head
of the first metatarsal extends almost to bone.
IMPRESSION: Findings consistent with osteomyelitis in the distal 3.5 cm of the
first metatarsal and in the medial and lateral sesamoids. Negative
for abscess or septic joint.

Status post amputation of the great toe.

Flattening of the heads of the second and third metatarsals
compatible with avascular necrosis or remote subchondral fractures.
There is secondary osteoarthritis about the second and third MTP
joints.

## 2020-03-01 DIAGNOSIS — I5031 Acute diastolic (congestive) heart failure: Secondary | ICD-10-CM | POA: Insufficient documentation

## 2020-03-01 DIAGNOSIS — I272 Pulmonary hypertension, unspecified: Secondary | ICD-10-CM

## 2020-03-01 DIAGNOSIS — N179 Acute kidney failure, unspecified: Secondary | ICD-10-CM | POA: Insufficient documentation

## 2020-03-01 DIAGNOSIS — D649 Anemia, unspecified: Secondary | ICD-10-CM | POA: Insufficient documentation

## 2020-03-01 DIAGNOSIS — I5030 Unspecified diastolic (congestive) heart failure: Secondary | ICD-10-CM

## 2020-03-01 HISTORY — DX: Unspecified diastolic (congestive) heart failure: I50.30

## 2020-03-01 HISTORY — DX: Pulmonary hypertension, unspecified: I27.20

## 2020-03-24 DIAGNOSIS — R0609 Other forms of dyspnea: Secondary | ICD-10-CM | POA: Insufficient documentation

## 2020-03-24 DIAGNOSIS — R609 Edema, unspecified: Secondary | ICD-10-CM | POA: Insufficient documentation

## 2020-05-03 DIAGNOSIS — I890 Lymphedema, not elsewhere classified: Secondary | ICD-10-CM | POA: Insufficient documentation

## 2020-05-03 DIAGNOSIS — I872 Venous insufficiency (chronic) (peripheral): Secondary | ICD-10-CM | POA: Insufficient documentation

## 2020-06-28 ENCOUNTER — Inpatient Hospital Stay
Admission: EM | Admit: 2020-06-28 | Discharge: 2020-06-29 | DRG: 308 | Disposition: A | Discharge status: Home / Self Care | Payer: BLUE CROSS/BLUE SHIELD | Attending: Internal Medicine | Admitting: Internal Medicine

## 2020-06-28 ENCOUNTER — Other Ambulatory Visit: Payer: Self-pay

## 2020-06-28 ENCOUNTER — Emergency Department: Payer: BLUE CROSS/BLUE SHIELD

## 2020-06-28 ENCOUNTER — Encounter: Payer: Self-pay | Admitting: Emergency Medicine

## 2020-06-28 DIAGNOSIS — I272 Pulmonary hypertension, unspecified: Secondary | ICD-10-CM | POA: Diagnosis present

## 2020-06-28 DIAGNOSIS — I5033 Acute on chronic diastolic (congestive) heart failure: Secondary | ICD-10-CM | POA: Diagnosis present

## 2020-06-28 DIAGNOSIS — I4819 Other persistent atrial fibrillation: Secondary | ICD-10-CM | POA: Diagnosis present

## 2020-06-28 DIAGNOSIS — Z7984 Long term (current) use of oral hypoglycemic drugs: Secondary | ICD-10-CM | POA: Diagnosis not present

## 2020-06-28 DIAGNOSIS — L280 Lichen simplex chronicus: Secondary | ICD-10-CM | POA: Diagnosis present

## 2020-06-28 DIAGNOSIS — I509 Heart failure, unspecified: Secondary | ICD-10-CM

## 2020-06-28 DIAGNOSIS — R0989 Other specified symptoms and signs involving the circulatory and respiratory systems: Secondary | ICD-10-CM | POA: Diagnosis present

## 2020-06-28 DIAGNOSIS — I5031 Acute diastolic (congestive) heart failure: Secondary | ICD-10-CM | POA: Diagnosis not present

## 2020-06-28 DIAGNOSIS — T447X5A Adverse effect of beta-adrenoreceptor antagonists, initial encounter: Secondary | ICD-10-CM | POA: Diagnosis present

## 2020-06-28 DIAGNOSIS — R001 Bradycardia, unspecified: Secondary | ICD-10-CM | POA: Diagnosis present

## 2020-06-28 DIAGNOSIS — Z79899 Other long term (current) drug therapy: Secondary | ICD-10-CM

## 2020-06-28 DIAGNOSIS — I4892 Unspecified atrial flutter: Secondary | ICD-10-CM | POA: Diagnosis not present

## 2020-06-28 DIAGNOSIS — E119 Type 2 diabetes mellitus without complications: Secondary | ICD-10-CM | POA: Diagnosis present

## 2020-06-28 DIAGNOSIS — I700 Atherosclerosis of aorta: Secondary | ICD-10-CM | POA: Diagnosis present

## 2020-06-28 DIAGNOSIS — T50905A Adverse effect of unspecified drugs, medicaments and biological substances, initial encounter: Secondary | ICD-10-CM | POA: Diagnosis present

## 2020-06-28 DIAGNOSIS — I452 Bifascicular block: Secondary | ICD-10-CM | POA: Diagnosis present

## 2020-06-28 DIAGNOSIS — G8929 Other chronic pain: Secondary | ICD-10-CM | POA: Diagnosis present

## 2020-06-28 DIAGNOSIS — L03119 Cellulitis of unspecified part of limb: Secondary | ICD-10-CM | POA: Diagnosis present

## 2020-06-28 DIAGNOSIS — Z20822 Contact with and (suspected) exposure to covid-19: Secondary | ICD-10-CM | POA: Diagnosis present

## 2020-06-28 DIAGNOSIS — R55 Syncope and collapse: Secondary | ICD-10-CM | POA: Diagnosis present

## 2020-06-28 DIAGNOSIS — Z7901 Long term (current) use of anticoagulants: Secondary | ICD-10-CM

## 2020-06-28 HISTORY — DX: Unspecified atrial fibrillation: I48.91

## 2020-06-28 LAB — GLUCOSE, CAPILLARY
Glucose-Capillary: 148 mg/dL — ABNORMAL HIGH (ref 70–99)
Glucose-Capillary: 160 mg/dL — ABNORMAL HIGH (ref 70–99)

## 2020-06-28 LAB — COMPREHENSIVE METABOLIC PANEL
ALT: 23 U/L (ref 0–44)
AST: 19 U/L (ref 15–41)
Albumin: 3.8 g/dL (ref 3.5–5.0)
Alkaline Phosphatase: 67 U/L (ref 38–126)
Anion gap: 9 (ref 5–15)
BUN: 22 mg/dL (ref 8–23)
CO2: 19 mmol/L — ABNORMAL LOW (ref 22–32)
Calcium: 9.7 mg/dL (ref 8.9–10.3)
Chloride: 108 mmol/L (ref 98–111)
Creatinine, Ser: 1.12 mg/dL (ref 0.61–1.24)
GFR calc non Af Amer: >60 mL/min (ref >60)
Glucose, Bld: 157 mg/dL — ABNORMAL HIGH (ref 70–99)
Potassium: 5 mmol/L (ref 3.5–5.1)
Sodium: 136 mmol/L (ref 135–145)
Total Bilirubin: 0.9 mg/dL (ref 0.3–1.2)
Total Protein: 6.8 g/dL (ref 6.5–8.1)

## 2020-06-28 LAB — TROPONIN I (HIGH SENSITIVITY)
Troponin I (High Sensitivity): 11 ng/L (ref <18)
Troponin I (High Sensitivity): 14 ng/L (ref <18)

## 2020-06-28 LAB — CBC
HCT: 34.1 % — ABNORMAL LOW (ref 39.0–52.0)
Hemoglobin: 10.9 g/dL — ABNORMAL LOW (ref 13.0–17.0)
MCH: 28.8 pg (ref 26.0–34.0)
MCHC: 32 g/dL (ref 30.0–36.0)
MCV: 90 fL (ref 80.0–100.0)
Platelets: 212 10*3/uL (ref 150–400)
RBC: 3.79 MIL/uL — ABNORMAL LOW (ref 4.22–5.81)
RDW: 18.5 % — ABNORMAL HIGH (ref 11.5–15.5)
WBC: 11.9 10*3/uL — ABNORMAL HIGH (ref 4.0–10.5)
nRBC: 0 % (ref 0.0–0.2)

## 2020-06-28 LAB — TSH: TSH: 1.726 u[IU]/mL (ref 0.350–4.500)

## 2020-06-28 LAB — BRAIN NATRIURETIC PEPTIDE: B Natriuretic Peptide: 404.7 pg/mL — ABNORMAL HIGH (ref 0.0–100.0)

## 2020-06-28 LAB — RESPIRATORY PANEL BY RT PCR (FLU A&B, COVID)
Influenza A by PCR: NEGATIVE
Influenza B by PCR: NEGATIVE
SARS Coronavirus 2 by RT PCR: NEGATIVE

## 2020-06-28 LAB — HEMOGLOBIN A1C
Hgb A1c MFr Bld: 5.5 % (ref 4.8–5.6)
Mean Plasma Glucose: 111.15 mg/dL

## 2020-06-28 MED ORDER — INSULIN ASPART 100 UNIT/ML ~~LOC~~ SOLN
0.0000 [IU] | Freq: Three times a day (TID) | SUBCUTANEOUS | Status: DC
  Filled 2020-06-28: qty 1

## 2020-06-28 MED ORDER — LISINOPRIL 5 MG PO TABS
2.5000 mg | ORAL_TABLET | Freq: Every day | ORAL | Status: DC
  Administered 2020-06-29: 2.5 mg via ORAL
  Filled 2020-06-28: qty 1

## 2020-06-28 MED ORDER — HYDRALAZINE HCL 20 MG/ML IJ SOLN
10.0000 mg | Freq: Four times a day (QID) | INTRAMUSCULAR | Status: DC | PRN
  Administered 2020-06-29: 10 mg via INTRAVENOUS
  Filled 2020-06-28: qty 1

## 2020-06-28 MED ORDER — ACETAMINOPHEN 650 MG RE SUPP: 650.0000 mg | Freq: Four times a day (QID) | RECTAL | Status: DC | PRN

## 2020-06-28 MED ORDER — SODIUM CHLORIDE 0.9% FLUSH
3.0000 mL | INTRAVENOUS | Status: DC | PRN
  Administered 2020-06-28: 3 mL via INTRAVENOUS

## 2020-06-28 MED ORDER — SODIUM CHLORIDE 0.9% FLUSH
3.0000 mL | Freq: Two times a day (BID) | INTRAVENOUS | Status: DC
  Administered 2020-06-28 – 2020-06-29 (×2): 3 mL via INTRAVENOUS

## 2020-06-28 MED ORDER — GLUCAGON HCL RDNA (DIAGNOSTIC) 1 MG IJ SOLR
5.0000 mg | Freq: Once | INTRAVENOUS | Status: AC
  Administered 2020-06-28: 5 mg via INTRAVENOUS
  Filled 2020-06-28: qty 5

## 2020-06-28 MED ORDER — ONDANSETRON HCL 4 MG PO TABS: 4.0000 mg | ORAL_TABLET | Freq: Four times a day (QID) | ORAL | Status: DC | PRN

## 2020-06-28 MED ORDER — ACETAMINOPHEN 325 MG PO TABS: 650.0000 mg | ORAL_TABLET | Freq: Four times a day (QID) | ORAL | Status: DC | PRN

## 2020-06-28 MED ORDER — ONDANSETRON HCL 4 MG/2ML IJ SOLN: 4.0000 mg | Freq: Four times a day (QID) | INTRAMUSCULAR | Status: DC | PRN

## 2020-06-28 MED ORDER — SODIUM CHLORIDE 0.9% FLUSH
3.0000 mL | Freq: Two times a day (BID) | INTRAVENOUS | Status: DC
  Administered 2020-06-29: 3 mL via INTRAVENOUS

## 2020-06-28 MED ORDER — PRAVASTATIN SODIUM 20 MG PO TABS
20.0000 mg | ORAL_TABLET | Freq: Every evening | ORAL | Status: DC
  Administered 2020-06-28: 20 mg via ORAL
  Filled 2020-06-28: qty 1

## 2020-06-28 MED ORDER — APIXABAN 2.5 MG PO TABS
2.5000 mg | ORAL_TABLET | Freq: Two times a day (BID) | ORAL | Status: DC
  Administered 2020-06-28 – 2020-06-29 (×2): 2.5 mg via ORAL
  Filled 2020-06-28 (×4): qty 1

## 2020-06-28 MED ORDER — FUROSEMIDE 10 MG/ML IJ SOLN
40.0000 mg | Freq: Every day | INTRAMUSCULAR | Status: DC
  Administered 2020-06-28 – 2020-06-29 (×2): 40 mg via INTRAVENOUS
  Filled 2020-06-28 (×3): qty 4

## 2020-06-28 MED ORDER — SODIUM CHLORIDE 0.9 % IV SOLN: 250.0000 mL | INTRAVENOUS | Status: DC | PRN

## 2020-06-28 MED ORDER — PANTOPRAZOLE SODIUM 40 MG PO TBEC
40.0000 mg | DELAYED_RELEASE_TABLET | Freq: Two times a day (BID) | ORAL | Status: DC
  Administered 2020-06-28 – 2020-06-29 (×2): 40 mg via ORAL
  Filled 2020-06-28 (×2): qty 1

## 2020-06-28 NOTE — H&P (Signed)
History and Physical    Tyler [name] GQQ:[id number] DOB: [birth year] DOA: 06/28/2020  PCP: Lynnea Ferrier, MD   Patient coming from: Home  I have personally briefly reviewed patient's old medical records in [facility]  Chief Complaint: Lightheadedness  HPI: Tyler [name] is a 74 y.o. male with medical history significant for atrial flutter, diabetes mellitus on oral hypoglycemic agents who was brought into the ER by EMS after patient had multiple near syncopal episodes at work.  Patient complains of generalized weakness and dizziness.  Per EMS patient was found to be bradycardic with heart rate between 20 and 25bpm.  He was given 0.5 mg of atropine as well as 500 cc of normal saline in route to the hospital.  Review of his records shows that patient is on carvedilol and metoprolol. Patient states that he has had an episode of bradycardia with heart rate in the 30s and had gone to see a cardiologist who decreased the dose of his beta-blocker but he states that he stopped taking it entirely because he did not feel good.  He is unable to tell me what medications he is taking at this time.  He has shortness of breath with exertion which improves at rest. Patient denies having any chest pain, no cough, no fever, no abdominal pain or any changes in his bowel habits.  He has lower extremity edema which patient states is chronic and unchanged. Labs show sodium 136, potassium 5, chloride 108, bicarb 19, BUN 22, creatinine 1.12, calcium 9.7, albumin 3.8, AST 19, ALT 23, total protein 6.8, white count 11.9, hemoglobin 10.9, hematocrit 34, MCV 90, RDW 18.5, platelet count 212 Respiratory viral panel is negative Chest x-ray reviewed by me shows cardiomegaly with pulmonary vascular congestion.  Small pleural effusions with mild interstitial edema. Twelve-lead EKG reviewed by me shows sinus bradycardia with left anterior fascicular block and right bundle branch block    ED Course: Patient is a  74 year old Caucasian male who was brought into the ER by EMS after he had multiple near syncopal episodes at work.  Patient was noted to be bradycardic in the field with heart rate between 20 and 25 bpm.  He received a dose of atropine 0.5 mg and 500 cc of normal saline. Chest x-ray shows pulmonary edema and twelve-lead EKG shows bradycardia but improved. Patient will be admitted to the hospital for further evaluation.  Review of Systems: As per HPI otherwise 10 point review of systems negative.    Past Medical History:  Diagnosis Date   A-fib [facility])    Arthritis    Diabetes mellitus without complication (HCC)    Wears dentures    full upper    Past Surgical History:  Procedure Laterality Date   CATARACT EXTRACTION W/PHACO Right 01/16/2017   Procedure: CATARACT EXTRACTION PHACO AND INTRAOCULAR LENS PLACEMENT (IOC)  Right Complicated;  Surgeon: Lockie Mola, MD;  Location: [facility];  Service: Ophthalmology;  Laterality: Right;  IVA Block Healon 5 malyugin vision blue Diabetic - oral meds   METATARSAL HEAD EXCISION Left 07/05/2018   Procedure: RESECTION FIRST METATARSAL INFECTED BONE AND SOFT TISSUE;  Surgeon: Recardo Evangelist, [name];  [name];  Service: Podiatry;  Laterality: Left;   TOE AMPUTATION Left    TONSILLECTOMY       reports that he has never smoked. He has never used smokeless tobacco. He reports that he does not drink alcohol. No history on file for drug use.  No Known Allergies  History reviewed. No pertinent family history.   Prior to Admission medications   Medication Sig Start Date End Date Taking? Authorizing Provider  carvedilol (COREG) 3.125 MG tablet Take 3.125 mg by mouth 2 (two) times daily. 05/23/20  Yes [provider]  ELIQUIS 2.5 MG TABS tablet Take 2.5 mg by mouth 2 (two) times daily. 06/13/20  Yes [provider]  glipiZIDE (GLUCOTROL) 10 MG tablet Take 10 mg by mouth 2 (two) times daily. 06/20/18  Yes  [provider]  lisinopril (ZESTRIL) 5 MG tablet Take 2.5 mg by mouth daily. 06/17/20  Yes [provider]  metFORMIN (GLUCOPHAGE) 1000 MG tablet Take 1,000 mg by mouth 2 (two) times daily. 06/17/18  Yes [provider]  metoprolol tartrate (LOPRESSOR) 25 MG tablet Take 25 mg by mouth 2 (two) times daily. 06/15/20  Yes [provider]  pantoprazole (PROTONIX) 40 MG tablet Take 40 mg by mouth 2 (two) times daily. 03/07/20  Yes [provider]  pravastatin (PRAVACHOL) 20 MG tablet Take 20 mg by mouth every evening. 06/19/20  Yes [provider]    Physical Exam: Vitals:   06/28/20 1358 06/28/20 1400  BP:  (!) 166/63  Pulse:  (!) 45  Resp:  (!) 24  Temp:  98.1 F (36.7 C)  TempSrc:  Oral  SpO2:  98%  Weight: 108.8 kg   Height: 6\' 6"  (1.981 m)      Vitals:   06/28/20 1358 06/28/20 1400  BP:  (!) 166/63  Pulse:  (!) 45  Resp:  (!) 24  Temp:  98.1 F (36.7 C)  TempSrc:  Oral  SpO2:  98%  Weight: 108.8 kg   Height: 6\' 6"  (1.981 m)     Constitutional: NAD, alert and oriented x 3.  Appears comfortable and in no distress Eyes: PERRL, lids and conjunctivae normal ENMT: Mucous membranes are moist.  Neck: normal, supple, no masses, no thyromegaly Respiratory: Crackles at the bases, no wheezing, no crackles. Normal respiratory effort. No accessory muscle use.  Cardiovascular: Regular rate and rhythm, no murmurs / rubs / gallops. 3+ extremity edema. 2+ pedal pulses. No carotid bruits.  Abdomen: no tenderness, no masses palpated. No hepatosplenomegaly. Bowel sounds positive.  Musculoskeletal: no clubbing / cyanosis. No joint deformity upper and lower extremities.  Skin: no rashes, lesions, ulcers.  Xerosis on lower extremities Neurologic: No gross focal neurologic deficit. Psychiatric: Normal mood and affect.   Labs on Admission: I have personally reviewed following labs and imaging studies  CBC: Recent Labs  Lab 06/28/20 1359    WBC 11.9*  HGB 10.9*  HCT 34.1*  MCV 90.0  PLT 212   Basic Metabolic Panel: Recent Labs  Lab 06/28/20 1359  NA 136  K 5.0  CL 108  CO2 19*  GLUCOSE 157*  BUN 22  CREATININE 1.12  CALCIUM 9.7   GFR: Estimated Creatinine Clearance: 73.7 mL/min (by C-G formula based on SCr of 1.12 mg/dL). Liver Function Tests: Recent Labs  Lab 06/28/20 1359  AST 19  ALT 23  ALKPHOS 67  BILITOT 0.9  PROT 6.8  ALBUMIN 3.8   No results for input(s): LIPASE, AMYLASE in the last 168 hours. No results for input(s): AMMONIA in the last 168 hours. Coagulation Profile: No results for input(s): INR, PROTIME in the last 168 hours. Cardiac Enzymes: No results for input(s): CKTOTAL, CKMB, CKMBINDEX, TROPONINI in the last 168 hours. BNP (last 3 results) No results for input(s): PROBNP in the last 8760 hours. HbA1C: No  results for input(s): HGBA1C in the last 72 hours. CBG: No results for input(s): GLUCAP in the last 168 hours. Lipid Profile: No results for input(s): CHOL, HDL, LDLCALC, TRIG, CHOLHDL, LDLDIRECT in the last 72 hours. Thyroid Function Tests: No results for input(s): TSH, T4TOTAL, FREET4, T3FREE, THYROIDAB in the last 72 hours. Anemia Panel: No results for input(s): VITAMINB12, FOLATE, FERRITIN, TIBC, IRON, RETICCTPCT in the last 72 hours. Urine analysis:    Component Value Date/Time   COLORURINE YELLOW (A) 07/03/2018 1027   APPEARANCEUR CLEAR (A) 07/03/2018 1027   APPEARANCEUR Clear 07/05/2014 2040   LABSPEC 1.016 07/03/2018 1027   LABSPEC 1.030 07/05/2014 2040   PHURINE 5.0 07/03/2018 1027   GLUCOSEU >=500 (A) 07/03/2018 1027   GLUCOSEU >=500 07/05/2014 2040   HGBUR SMALL (A) 07/03/2018 1027   BILIRUBINUR NEGATIVE 07/03/2018 1027   BILIRUBINUR Negative 07/05/2014 2040   KETONESUR 5 (A) 07/03/2018 1027   PROTEINUR 30 (A) 07/03/2018 1027   NITRITE NEGATIVE 07/03/2018 1027   LEUKOCYTESUR NEGATIVE 07/03/2018 1027   LEUKOCYTESUR Negative 07/05/2014 2040     Radiological Exams on Admission: DG Chest Portable 1 View  Result Date: 06/28/2020 CLINICAL DATA:  Bradycardia EXAM: PORTABLE CHEST 1 VIEW COMPARISON:  July 03, 2018 FINDINGS: There are small pleural effusions bilaterally with mild interstitial pulmonary edema. No consolidation. There is cardiomegaly with pulmonary venous hypertension. No adenopathy. There is aortic atherosclerosis. No bone lesions. IMPRESSION: Cardiomegaly with pulmonary vascular congestion. Small pleural effusions with mild interstitial edema. Suspect a degree of congestive heart failure. No consolidation. Aortic Atherosclerosis (ICD10-I70.0). Electronically Signed   By: Bretta Bang III M.D.   On: 06/28/2020 14:27    EKG: Independently reviewed.  Sinus bradycardia  Assessment/Plan Principal Problem:   Drug-induced bradycardia Active Problems:   Syncope and collapse   Acute CHF (congestive heart failure) (HCC)   Diabetes mellitus without complication (HCC)     Drug-induced bradycardia Most likely secondary to beta-blocker use Review of patient's medications shows he was on metoprolol and carvedilol.   Patient states he discontinued his beta-blocker because of bradycardia but is unable to tell me what medications he is taking now Patient was symptomatic with multiple near syncopal episodes We will place patient on cardiac monitor Give a dose of glucagon Obtain TSH levels We will hold metoprolol and carvedilol for now Consult cardiology   Acute diastolic dysfunction CHF Chest x-ray shows bilateral pleural effusion Patient with complaints of exertional shortness of breath We will obtain BNP levels Place patient on Lasix 40 mg IV daily Optimize blood pressure control We will place patient on IV hydralazine for systolic blood pressure greater than    Diabetes mellitus Maintain consistent carbohydrate diet Glycemic control with insulin   History of A. fib with slow ventricular  rate Hold beta-blockers for now Continue apixaban as primary prophylaxis for an acute stroke      DVT prophylaxis: Apixaban Code Status: Full code Family Communication: Greater than 50% of time was spent discussing patient's condition and plan of care with him at the bedside.  All questions and concerns have been addressed.  He verbalizes understanding and agrees with the plan. Disposition Plan: Back to previous home environment Consults called: Cardiology    Lucile Shutters MD [facility]     06/28/2020, 4:41 PM

## 2020-06-28 NOTE — ED Triage Notes (Signed)
Pt presents via acems with c/o bradycardia. Pt had multiple near-syncopal episodes while at work today. Pt denies chest pain or shortness of breath. Pt has hx of afib. Pt was 23bpm 0.5 mg atropine given by ems in route. normal saline given in route by ems. Bilateral swelling noted to legs, patient states this is his normal. Pt currently alert and oriented x4.

## 2020-06-28 NOTE — ED Provider Notes (Signed)
[facility] Emergency Department Provider Note  Time seen: 2:35 PM  I have reviewed the triage vital signs and the nursing notes.   HISTORY  Chief Complaint Bradycardia   HPI Tyler [name] is a 74 y.o. male with a past medical history of atrial fibrillation, diabetes, presents to the emergency department for generalized weakness and near syncope.   According to the patient he had multiple near syncopal episodes today while at work, EMS was called and found the patient to be in bradycardia around 20 to 25 bpm.  There was gave 0.5 mg of atropine as well as 500 cc of normal saline in route to the hospital.  Upon arrival patient is awake alert, no acute distress.  Patient states he is feeling better now than earlier today.  Denies any chest pain now or at any point.  Denies any shortness of breath.  No fever or cough.  Patient does state lower extremity edema which is chronic and largely unchanged per patient.  Denies any recent changes in his medications.  Per medication review it appears that patient takes metoprolol 100 mg twice daily, patient is not sure.  Past Medical History:  Diagnosis Date  . A-fib (HCC)   . Arthritis   . Diabetes mellitus without complication (HCC)   . Wears dentures    full upper    Patient Active Problem List   Diagnosis Date Noted  . Sepsis (HCC) 07/03/2018    Past Surgical History:  Procedure Laterality Date  . CATARACT EXTRACTION W/PHACO Right 01/16/2017   Procedure: CATARACT EXTRACTION PHACO AND INTRAOCULAR LENS PLACEMENT (IOC)  Right Complicated;  Surgeon: Lockie Mola, MD;  Location: [facility];  Service: Ophthalmology;  Laterality: Right;  IVA Block Healon 5 malyugin vision blue Diabetic - oral meds  . METATARSAL HEAD EXCISION Left 07/05/2018   Procedure: RESECTION FIRST METATARSAL INFECTED BONE AND SOFT TISSUE;  Surgeon: Recardo Evangelist, [name];  [name];  Service: Podiatry;  Laterality: Left;  . TOE  AMPUTATION Left   . TONSILLECTOMY      Prior to Admission medications   Medication Sig Start Date End Date Taking? Authorizing Provider  amLODipine (NORVASC) 10 MG tablet Take 1 tablet (10 mg total) by mouth daily. 07/09/18   Adrian Saran, MD  amoxicillin-clavulanate (AUGMENTIN) 875-125 MG tablet Take 1 tablet by mouth 2 (two) times daily. 07/08/18   Adrian Saran, MD  glipiZIDE (GLUCOTROL) 10 MG tablet Take 10 mg by mouth 2 (two) times daily. 06/20/18   [provider]  metFORMIN (GLUCOPHAGE) 1000 MG tablet Take 1,000 mg by mouth 2 (two) times daily. 06/17/18   [provider]  metoprolol tartrate (LOPRESSOR) 100 MG tablet Take 1 tablet (100 mg total) by mouth 2 (two) times daily. 07/08/18   Adrian Saran, MD  metoprolol tartrate (LOPRESSOR) 25 MG tablet Take 25 mg by mouth 2 (two) times daily. 06/15/20   [provider]  Multiple Vitamin (MULTIVITAMIN) tablet Take 1 tablet by mouth daily.    [provider]  mupirocin cream (BACTROBAN) 2 % Apply topically daily. 07/08/18   Adrian Saran, MD  pantoprazole (PROTONIX) 40 MG tablet Take 40 mg by mouth 2 (two) times daily. 03/07/20   [provider]  pravastatin (PRAVACHOL) 20 MG tablet Take 20 mg by mouth every evening. 06/19/20   [provider]    No Known Allergies  History reviewed. No pertinent family history.  Social History Social History   Tobacco Use  . Smoking status:  Never Smoker  . Smokeless tobacco: Never Used  Substance Use Topics  . Alcohol use: No  . Drug use: Not on file    Review of Systems Constitutional: Negative for fever. Cardiovascular: Negative for chest pain.  Positive for bradycardia Respiratory: Negative for shortness of breath. Gastrointestinal: Negative for abdominal pain, vomiting  Genitourinary: Negative for urinary compaints Musculoskeletal: Negative for musculoskeletal complaints Neurological: Negative for headache All other ROS  negative  ____________________________________________   PHYSICAL EXAM:  VITAL SIGNS: ED Triage Vitals  Enc Vitals Group     BP 06/28/20 1400 (!) 166/63     Pulse Rate 06/28/20 1400 (!) 45     Resp 06/28/20 1400 (!) 24     Temp 06/28/20 1400 98.1 F (36.7 C)     Temp Source 06/28/20 1400 Oral     SpO2 06/28/20 1400 98 %     Weight 06/28/20 1358 239 lb 13.8 oz (108.8 kg)     Height 06/28/20 1358 6\' 6"  (1.981 m)     Head Circumference --      Peak Flow --      Pain Score 06/28/20 1358 0     Pain Loc --      Pain Edu? --      Excl. in GC? --    Constitutional: Alert and oriented. Well appearing and in no distress. Eyes: Normal exam ENT      Head: Normocephalic and atraumatic.      Mouth/Throat: Mucous membranes are moist. Cardiovascular: Regular rhythm rate around 40 bpm. Respiratory: Normal respiratory effort without tachypnea nor retractions. Breath sounds are clear  Gastrointestinal: Soft and nontender. No distention.  Musculoskeletal: 2+ lower extremity edema.  Equal bilaterally.  Nontender. Neurologic:  Normal speech and language. No gross focal neurologic deficits  Skin:  Skin is warm, dry and intact.  Psychiatric: Mood and affect are normal.   ____________________________________________    EKG  EKG viewed and interpreted by myself appears to show atrial flutter with a very slow ventricular response rate around 40 to 45 bpm.  Slightly widened QRS, left axis deviation.  No obvious or concerning ST changes noted.  ____________________________________________    RADIOLOGY  Chest x-ray shows cardiomegaly with vascular congestion.  ____________________________________________   INITIAL IMPRESSION / ASSESSMENT AND PLAN / ED COURSE  Pertinent labs & imaging results that were available during my care of the patient were reviewed by me and considered in my medical decision making (see chart for details).   Patient presents to the emergency department for  multiple near syncopal events today while at work.  Patient found to be in bradycardia likely a flutter with slow ventricular response rate.  Patient given 0.5 mg of atropine by EMS, upon arrival has a pulse rate around 40 to 45 bpm.  Blood pressure reassuringly is hypertensive 166/63.  Patient appears to be on metoprolol 100 mg twice daily.  There is a small degree of interstitial edema on chest x-ray.  We will continue to closely monitor the patient.  Lab work is pending at this time.  Patient will require admission to the hospital regardless when his emergency department work-up is been completed.  Work-up is largely nonrevealing.  Troponin negative.  Covid negative.  I spoke to Dr. 08/28/20 of cardiology recommends admission taking him off of his metoprolol.  Spoke to the hospitalist will be admitting the patient to their service.  Patient remains well-appearing in the emergency department and largely asymptomatic.    Tyler [name] was evaluated  in Emergency Department on 06/28/2020 for the symptoms described in the history of present illness. He was evaluated in the context of the global COVID-19 pandemic, which necessitated consideration that the patient might be at risk for infection with the SARS-CoV-2 virus that causes COVID-19. Institutional protocols and algorithms that pertain to the evaluation of patients at risk for COVID-19 are in a state of rapid change based on information released by regulatory bodies including the CDC and federal and state organizations. These policies and algorithms were followed during the patient's care in the ED.  ____________________________________________   FINAL CLINICAL IMPRESSION(S) / ED DIAGNOSES  Symptomatic bradycardia Atrial flutter with slow ventricular rate   Minna Antis, MD 06/28/20 1526

## 2020-06-28 NOTE — ED Notes (Signed)
Pt given urinal, apple juice, and warm blanket at this time. No further needs noted at this time. Pt comfortable in bed.

## 2020-06-28 NOTE — Consult Note (Signed)
Cardiology Consultation Note    Patient ID: Tyler [name], MRN: [id number], DOB/AGE: 76/14/1947 76 y.o. Admit date: 06/28/2020   Date of Consult: 06/28/2020 Primary Physician: Adin Hector, MD Primary Cardiologist: Dr. Geanie Berlin, [place]  Chief Complaint: dizzy Reason for Consultation: bradycardia Requesting MD: Dr. Francine Graven  HPI: Tyler [name] is a 74 y.o. male with history of persistent atrial fibrillation, HFpEF who has been treated for his A. fib with metoprolol.  Has also been anticoagulated with Eliquis.  Has had his metoprolol decreased somewhat from 100 mg down to 25 mg twice daily.  This was due to relative bradycardia in his primary cardiologist office in [place].  He was on as much as Toprol-XL 100 mg twice daily however currently has had this decreased as mentioned above.  Was evaluated in the emergency room in [place] noted to have mild interstitial edema.  He has a history of chronic chest pain.  He was last seen by his cardiologist in August 2021.  At that time had his metoprolol decreased to 12.5 mg twice daily.  He had an echocardiogram in June of this year showing ejection fraction 55 to 60% with moderate aortic sclerosis no stenosis.  He had mitral annular calcification and moderate pulmonary hypertension with moderate MR.  Holter monitor placed in July 2021 revealed persistent A. fib with an average rate of 61 slowest of 44.  Laboratories today showed normal troponin.  Renal function is normal.  EKG showed atrial flutter with slow ventricular response ventricular rate of 45.  Patient is a somewhat diffuse historian but states that he has been off of his beta-blockers for some time although he has notes suggested he is on metoprolol 12.5 mg twice daily as well as possibly carvedilol.  He denies chest pain.  He feels better since he is resting in the ER.  Patient has atrial flutter with high-grade block and rates in the mid 40s.  He is hemodynamically stable. Past  Medical History:  Diagnosis Date  . A-fib ([place])   . Arthritis   . Diabetes mellitus without complication ([place])   . Wears dentures    full upper      Surgical History:  Past Surgical History:  Procedure Laterality Date  . CATARACT EXTRACTION W/PHACO Right 01/16/2017   Procedure: CATARACT EXTRACTION PHACO AND INTRAOCULAR LENS PLACEMENT ([place])  Right Complicated;  [name], MD;  Location: [facility];  Service: Ophthalmology;  Laterality: Right;  IVA Block Healon 5 malyugin vision blue Diabetic - oral meds  . METATARSAL HEAD EXCISION Left 07/05/2018   Procedure: RESECTION FIRST METATARSAL INFECTED BONE AND SOFT TISSUE;  [name], [name];  [name];  Service: Podiatry;  Laterality: Left;  . TOE AMPUTATION Left   . TONSILLECTOMY       Home Meds: Prior to Admission medications   Medication Sig Start Date End Date Taking? Authorizing Provider  carvedilol (COREG) 3.125 MG tablet Take 3.125 mg by mouth 2 (two) times daily. 05/23/20  Yes [provider]  ELIQUIS 2.5 MG TABS tablet Take 2.5 mg by mouth 2 (two) times daily. 06/13/20  Yes [provider]  glipiZIDE (GLUCOTROL) 10 MG tablet Take 10 mg by mouth 2 (two) times daily. 06/20/18  Yes [provider]  lisinopril (ZESTRIL) 5 MG tablet Take 2.5 mg by mouth daily. 06/17/20  Yes [provider]  metFORMIN (GLUCOPHAGE) 1000 MG tablet Take 1,000 mg by mouth 2 (two) times daily. 06/17/18  Yes [provider]  metoprolol tartrate (LOPRESSOR) 25 MG tablet Take 25 mg by mouth 2 (two) times daily. 06/15/20  Yes [provider]  pantoprazole (PROTONIX) 40 MG tablet Take 40 mg by mouth 2 (two) times daily. 03/07/20  Yes [provider]  pravastatin (PRAVACHOL) 20 MG tablet Take 20 mg by mouth every evening. 06/19/20  Yes [provider]    Inpatient Medications:  . apixaban  2.5 mg Oral BID  . insulin aspart  0-15 Units Subcutaneous  TID WC  . [START ON 06/29/2020] lisinopril  2.5 mg Oral Daily  . pantoprazole  40 mg Oral BID  . pravastatin  20 mg Oral QPM  . sodium chloride flush  3 mL Intravenous Q12H  . sodium chloride flush  3 mL Intravenous Q12H   . sodium chloride    . glucagon (GLUCAGEN)  IVPB (bolus for beta blocker/calcium channel bocker overdose)      Allergies: No Known Allergies  Social History   Socioeconomic History  . Marital status: Single    Spouse name: Not on file  . Number of children: Not on file  . Years of education: Not on file  . Highest education level: Not on file  Occupational History  . Not on file  Tobacco Use  . Smoking status: Never Smoker  . Smokeless tobacco: Never Used  Substance and Sexual Activity  . Alcohol use: No  . Drug use: Not on file  . Sexual activity: Not on file  Other Topics Concern  . Not on file  Social History Narrative  . Not on file   Social Determinants of Health   Financial Resource Strain:   . Difficulty of Paying Living Expenses: Not on file  Food Insecurity:   . Worried About Charity fundraiser in the Last Year: Not on file  . Ran Out of Food in the Last Year: Not on file  Transportation Needs:   . Lack of Transportation (Medical): Not on file  . Lack of Transportation (Non-Medical): Not on file  Physical Activity:   . Days of Exercise per Week: Not on file  . Minutes of Exercise per Session: Not on file  Stress:   . Feeling of Stress : Not on file  Social Connections:   . Frequency of Communication with Friends and Family: Not on file  . Frequency of Social Gatherings with Friends and Family: Not on file  . Attends Religious Services: Not on file  . Active Member of Clubs or Organizations: Not on file  . Attends Archivist Meetings: Not on file  . Marital Status: Not on file  Intimate Partner Violence:   . Fear of Current or Ex-Partner: Not on file  . Emotionally Abused: Not on file  . Physically Abused: Not on file   . Sexually Abused: Not on file     History reviewed. No pertinent family history.   Review of Systems: A 12-system review of systems was performed and is negative except as noted in the HPI.  Labs: No results for input(s): CKTOTAL, CKMB, TROPONINI in the last 72 hours. Lab Results  Component Value Date   WBC 11.9 (H) 06/28/2020   HGB 10.9 (L) 06/28/2020   HCT 34.1 (L) 06/28/2020   MCV 90.0 06/28/2020   PLT 212 06/28/2020    Recent Labs  Lab 06/28/20 1359  NA 136  K 5.0  CL 108  CO2 19*  BUN 22  CREATININE 1.12  CALCIUM 9.7  PROT 6.8  BILITOT 0.9  ALKPHOS 67  ALT 23  AST 19  GLUCOSE 157*   Lab Results  Component Value Date   CHOL 102 07/07/2014   HDL 25 (L) 07/07/2014   LDLCALC 55 07/07/2014   TRIG 112 07/07/2014   No results found for: DDIMER  Radiology/Studies:  DG Chest Portable 1 View  Result Date: 06/28/2020 CLINICAL DATA:  Bradycardia EXAM: PORTABLE CHEST 1 VIEW COMPARISON:  July 03, 2018 FINDINGS: There are small pleural effusions bilaterally with mild interstitial pulmonary edema. No consolidation. There is cardiomegaly with pulmonary venous hypertension. No adenopathy. There is aortic atherosclerosis. No bone lesions. IMPRESSION: Cardiomegaly with pulmonary vascular congestion. Small pleural effusions with mild interstitial edema. Suspect a degree of congestive heart failure. No consolidation. Aortic Atherosclerosis (ICD10-I70.0). Electronically Signed   By: Lowella Grip III M.D.   On: 06/28/2020 14:27    Wt Readings from Last 3 Encounters:  06/28/20 108.8 kg  07/05/18 108.8 kg  01/16/17 109.8 kg    EKG: Atrial flutter with ventricular rate of 45.  Physical Exam:  Blood pressure (!) 166/63, pulse (!) 45, temperature 98.1 F (36.7 C), temperature source Oral, resp. rate (!) 24, height 6' 6"  (1.981 m), weight 108.8 kg, SpO2 98 %. Body mass index is 27.72 kg/m. General: Well developed, well nourished, in no acute distress. Head:  Normocephalic, atraumatic, sclera non-icteric, no xanthomas, nares are without discharge.  Neck: Negative for carotid bruits. JVD not elevated. Lungs: Clear bilaterally to auscultation without wheezes, rales, or rhonchi. Breathing is unlabored. Heart: Irregular bradycardic rhythm. Abdomen: Soft, non-tender, non-distended with normoactive bowel sounds. No hepatomegaly. No rebound/guarding. No obvious abdominal masses. Msk:  Strength and tone appear normal for age. Extremities: No clubbing or cyanosis. No edema.  Distal pedal pulses are 2+ and equal bilaterally. Neuro: Alert and oriented X 3. No facial asymmetry. No focal deficit. Moves all extremities spontaneously. Psych:  Responds to questions appropriately with a normal affect.     Assessment and Plan  74 year old male with history of atrial for flutter, history of heart failure preserved ejection fraction who has been followed both locally and also in [facility] for atrial flutter with variable ventricular response.  He was on Toprol-XL 100 twice daily but this has been gradually decreased.  He states he has been off of metoprolol for some time although notes from his cardiologist from little over a month ago showed that he was on 12.5 of metoprolol tartrate twice daily.  Other chart suggest possibly carvedilol.  Will discontinue all AV nodal related drugs and follow his heart rate.  Should his heart rate improved, remain off of his AV nodal related medications.  If he remains bradycardic, consideration for permanent pacemaker need to be discussed.  Initial discussion with the patient was met with resistance regarding this.  Would continue with apixaban at 2.5 mg twice daily and lisinopril 2.5 mg daily for now.  He has ruled out for myocardial infarction with a serum troponin of 11 with a creatinine of 1.12.  Signed, Teodoro Spray MD 06/28/2020, 3:39 PM Pager: [phone]

## 2020-06-29 ENCOUNTER — Encounter: Payer: Self-pay | Admitting: Internal Medicine

## 2020-06-29 DIAGNOSIS — T50905A Adverse effect of unspecified drugs, medicaments and biological substances, initial encounter: Secondary | ICD-10-CM

## 2020-06-29 DIAGNOSIS — R001 Bradycardia, unspecified: Secondary | ICD-10-CM

## 2020-06-29 DIAGNOSIS — E119 Type 2 diabetes mellitus without complications: Secondary | ICD-10-CM

## 2020-06-29 LAB — CBC
HCT: 33.1 % — ABNORMAL LOW (ref 39.0–52.0)
Hemoglobin: 11.1 g/dL — ABNORMAL LOW (ref 13.0–17.0)
MCH: 29.1 pg (ref 26.0–34.0)
MCHC: 33.5 g/dL (ref 30.0–36.0)
MCV: 86.6 fL (ref 80.0–100.0)
Platelets: 174 10*3/uL (ref 150–400)
RBC: 3.82 MIL/uL — ABNORMAL LOW (ref 4.22–5.81)
RDW: 18.6 % — ABNORMAL HIGH (ref 11.5–15.5)
WBC: 8 10*3/uL (ref 4.0–10.5)
nRBC: 0 % (ref 0.0–0.2)

## 2020-06-29 LAB — BASIC METABOLIC PANEL
Anion gap: 7 (ref 5–15)
BUN: 23 mg/dL (ref 8–23)
CO2: 25 mmol/L (ref 22–32)
Calcium: 9.9 mg/dL (ref 8.9–10.3)
Chloride: 107 mmol/L (ref 98–111)
Creatinine, Ser: 0.97 mg/dL (ref 0.61–1.24)
GFR calc non Af Amer: >60 mL/min (ref >60)
Glucose, Bld: 120 mg/dL — ABNORMAL HIGH (ref 70–99)
Potassium: 4.7 mmol/L (ref 3.5–5.1)
Sodium: 139 mmol/L (ref 135–145)

## 2020-06-29 LAB — GLUCOSE, CAPILLARY
Glucose-Capillary: 87 mg/dL (ref 70–99)
Glucose-Capillary: 143 mg/dL — ABNORMAL HIGH (ref 70–99)

## 2020-06-29 NOTE — Discharge Summary (Signed)
Physician Discharge Summary  Tyler [name] NAT:[id number] DOB: 1946/03/21 DOA: 06/28/2020  PCP: Lynnea Ferrier, MD  Admit date: 06/28/2020 Discharge date: 06/29/2020  Admitted From: home  Disposition: home  Recommendations for Outpatient Follow-up:  1. Follow up with PCP in 1-2 weeks 2. F/u cardio, Dr. Houston Siren, w/in 1 week   Home Health: no  Equipment/Devices:  Discharge Condition: stable  CODE STATUS: full  Diet recommendation: Heart Healthy / Carb Modified  Brief/Interim Summary: HPI was taken from Dr. Joylene Igo: Tyler [name] is a 74 y.o. male with medical history significant for atrial flutter, diabetes mellitus on oral hypoglycemic agents who was brought into the ER by EMS after patient had multiple near syncopal episodes at work.  Patient complains of generalized weakness and dizziness.  Per EMS patient was found to be bradycardic with heart rate between 20 and 25bpm.  He was given 0.5 mg of atropine as well as 500 cc of normal saline in route to the hospital.  Review of his records shows that patient is on carvedilol and metoprolol. Patient states that he has had an episode of bradycardia with heart rate in the 30s and had gone to see a cardiologist who decreased the dose of his beta-blocker but he states that he stopped taking it entirely because he did not feel good.  He is unable to tell me what medications he is taking at this time.  He has shortness of breath with exertion which improves at rest. Patient denies having any chest pain, no cough, no fever, no abdominal pain or any changes in his bowel habits.  He has lower extremity edema which patient states is chronic and unchanged. Labs show sodium 136, potassium 5, chloride 108, bicarb 19, BUN 22, creatinine 1.12, calcium 9.7, albumin 3.8, AST 19, ALT 23, total protein 6.8, white count 11.9, hemoglobin 10.9, hematocrit 34, MCV 90, RDW 18.5, platelet count 212 Respiratory viral panel is negative Chest x-ray reviewed by me  shows cardiomegaly with pulmonary vascular congestion.  Small pleural effusions with mild interstitial edema. Twelve-lead EKG reviewed by me shows sinus bradycardia with left anterior fascicular block and right bundle branch block    ED Course: Patient is a 74 year old Caucasian male who was brought into the ER by EMS after he had multiple near syncopal episodes at work.  Patient was noted to be bradycardic in the field with heart rate between 20 and 25 bpm.  He received a dose of atropine 0.5 mg and 500 cc of normal saline. Chest x-ray shows pulmonary edema and twelve-lead EKG shows bradycardia but improved. Patient will be admitted to the hospital for further evaluation.  Hospital Course from Dr. Wilfred Lacy 06/29/20: Pt presented w/ bradycardia secondary to beta blocker use. Beta blockers were d/c. No indication for acute pacemaker as per cardio. Pt will f/u w/ his normal cardiologist as an outpatient within 1 week. For more information, please see other progress notes.  Discharge Diagnoses:  Principal Problem:   Drug-induced bradycardia Active Problems:   Syncope and collapse   Acute CHF (congestive heart failure) (HCC)   Diabetes mellitus without complication (HCC)  Drug-induced bradycardia: secondary to beta blocker. BB was d/c. Continue on tele. Cardio following and recs apprec  Acute CHF exacerbation: unknown systolic vs diastolic vs mixed. No echo. Continue on lasix. Monitor I/Os. Will f/u outpatient w/ cardio   DM2: continue on SSI w/ accuchecks. Carb modified diet   A. Fib: likely PAF. Beta blockers d/c. Continue on eliquis  Discharge Instructions  Discharge Instructions    Diet - low sodium heart healthy   Complete by: As directed    Diet Carb Modified   Complete by: As directed    Discharge instructions   Complete by: As directed    F/u cardio in 1-2 days. F/u PCP in 1-2 weeks   Increase activity slowly   Complete by: As directed      Allergies as of  06/29/2020   No Known Allergies     Medication List    STOP taking these medications   carvedilol 3.125 MG tablet Commonly known as: COREG   metoprolol tartrate 25 MG tablet Commonly known as: LOPRESSOR     TAKE these medications   Eliquis 2.5 MG Tabs tablet Generic drug: apixaban Take 2.5 mg by mouth 2 (two) times daily.   glipiZIDE 10 MG tablet Commonly known as: GLUCOTROL Take 10 mg by mouth 2 (two) times daily.   lisinopril 5 MG tablet Commonly known as: ZESTRIL Take 2.5 mg by mouth daily.   metFORMIN 1000 MG tablet Commonly known as: GLUCOPHAGE Take 1,000 mg by mouth 2 (two) times daily.   pantoprazole 40 MG tablet Commonly known as: PROTONIX Take 40 mg by mouth 2 (two) times daily.   pravastatin 20 MG tablet Commonly known as: PRAVACHOL Take 20 mg by mouth every evening.       Follow-up Information    [facility] Follow up on 07/11/2020.   Specialty: Cardiology Why: at 10:00am. Enter through the Medical Mall entrance Contact information: [address] [place] [address] [phone]       Wille Glaser, MD Follow up in 1 week(s).   Specialty: Cardiology Contact information: [address] [place] [address] [phone]              No Known Allergies  Consultations:  Cardio, Dr. Lady Gary    Procedures/Studies: DG Chest Portable 1 View  Result Date: 06/28/2020 CLINICAL DATA:  Bradycardia EXAM: PORTABLE CHEST 1 VIEW COMPARISON:  July 03, 2018 FINDINGS: There are small pleural effusions bilaterally with mild interstitial pulmonary edema. No consolidation. There is cardiomegaly with pulmonary venous hypertension. No adenopathy. There is aortic atherosclerosis. No bone lesions. IMPRESSION: Cardiomegaly with pulmonary vascular congestion. Small pleural effusions with mild interstitial edema. Suspect a degree of congestive heart  failure. No consolidation. Aortic Atherosclerosis (ICD10-I70.0). Electronically Signed   By: Bretta Bang III M.D.   On: 06/28/2020 14:27     Subjective: Pt c/o fatigue    Discharge Exam: Vitals:   06/29/20 1142 06/29/20 1226  BP: (!) 189/59   Pulse: (!) 47 (!) 47  Resp: 18   Temp: 97.6 F (36.4 C)   SpO2: 100% 94%   Vitals:   06/29/20 0400 06/29/20 0807 06/29/20 1142 06/29/20 1226  BP: (!) 167/43 (!) 190/59 (!) 189/59   Pulse: (!) 46 (!) 47 (!) 47 (!) 47  Resp: 18 19 18    Temp:  97.9 F (36.6 C) 97.6 F (36.4 C)   TempSrc:   Oral   SpO2: 95% 100% 100% 94%  Weight:      Height:        General: Pt is alert, awake, not in acute distress Cardiovascular: S1/S2 +, no rubs, no gallops Respiratory: CTA bilaterally, no wheezing, no rhonchi Abdominal: Soft, NT, ND, bowel sounds + Extremities:  no cyanosis    The results of significant diagnostics from this hospitalization (including imaging,  microbiology, ancillary and laboratory) are listed below for reference.     Microbiology: Recent Results (from the past 240 hour(s))  Respiratory Panel by RT PCR (Flu A&B, Covid) - Nasopharyngeal Swab     Status: None   Collection Time: 06/28/20  1:59 PM   Specimen: Nasopharyngeal Swab  Result Value Ref Range Status   SARS Coronavirus 2 by RT PCR NEGATIVE NEGATIVE Final    Comment: (NOTE) SARS-CoV-2 target nucleic acids are NOT DETECTED.  The SARS-CoV-2 RNA is generally detectable in upper respiratoy specimens during the acute phase of infection. The lowest concentration of SARS-CoV-2 viral copies this assay can detect is 131 copies/mL. A negative result does not preclude SARS-Cov-2 infection and should not be used as the sole basis for treatment or other patient management decisions. A negative result may occur with  improper specimen collection/handling, submission of specimen other than nasopharyngeal swab, presence of viral mutation(s) within the areas targeted by this  assay, and inadequate number of viral copies (<131 copies/mL). A negative result must be combined with clinical observations, patient history, and epidemiological information. The expected result is Negative.  Fact Sheet for Patients:  https://www.moore.com/https://www.fda.gov/media/142436/download  Fact Sheet for Healthcare Providers:  https://www.young.biz/https://www.fda.gov/media/142435/download  This test is no t yet approved or cleared by the [place] FDA and  has been authorized for detection and/or diagnosis of SARS-CoV-2 by FDA under an Emergency Use Authorization (EUA). This EUA will remain  in effect (meaning this test can be used) for the duration of the COVID-19 declaration under Section 564(b)(1) of the Act, 21 U.S.C. section 360bbb-3(b)(1), unless the authorization is terminated or revoked sooner.     Influenza A by PCR NEGATIVE NEGATIVE Final   Influenza B by PCR NEGATIVE NEGATIVE Final    Comment: (NOTE) The Xpert Xpress SARS-CoV-2/FLU/RSV assay is intended as an aid in  the diagnosis of influenza from Nasopharyngeal swab specimens and  should not be used as a sole basis for treatment. Nasal washings and  aspirates are unacceptable for Xpert Xpress SARS-CoV-2/FLU/RSV  testing.  Fact Sheet for Patients: https://www.moore.com/https://www.fda.gov/media/142436/download  Fact Sheet for Healthcare Providers: https://www.young.biz/https://www.fda.gov/media/142435/download  This test is not yet approved or cleared by the [place] FDA and  has been authorized for detection and/or diagnosis of SARS-CoV-2 by  FDA under an Emergency Use Authorization (EUA). This EUA will remain  in effect (meaning this test can be used) for the duration of the  Covid-19 declaration under Section 564(b)(1) of the Act, 21  U.S.C. section 360bbb-3(b)(1), unless the authorization is  terminated or revoked. Performed at [facility], [address] [place], [place] [id number]      Labs: BNP (last 3 results) Recent Labs    06/28/20 2235  BNP 404.7*    Basic Metabolic Panel: Recent Labs  Lab 06/28/20 1359 06/29/20 0524  NA 136 139  K 5.0 4.7  CL 108 107  CO2 19* 25  GLUCOSE 157* 120*  BUN 22 23  CREATININE 1.12 0.97  CALCIUM 9.7 9.9   Liver Function Tests: Recent Labs  Lab 06/28/20 1359  AST 19  ALT 23  ALKPHOS 67  BILITOT 0.9  PROT 6.8  ALBUMIN 3.8   No results for input(s): LIPASE, AMYLASE in the last 168 hours. No results for input(s): AMMONIA in the last 168 hours. CBC: Recent Labs  Lab 06/28/20 1359 06/29/20 0524  WBC 11.9* 8.0  HGB 10.9* 11.1*  HCT 34.1* 33.1*  MCV 90.0 86.6  PLT 212 174   Cardiac Enzymes: No  results for input(s): CKTOTAL, CKMB, CKMBINDEX, TROPONINI in the last 168 hours. BNP: Invalid input(s): POCBNP CBG: Recent Labs  Lab 06/28/20 1801 06/28/20 2204 06/29/20 0806 06/29/20 1136  GLUCAP 148* 160* 87 143*   D-Dimer No results for input(s): DDIMER in the last 72 hours. Hgb A1c Recent Labs    06/28/20 1359  HGBA1C 5.5   Lipid Profile No results for input(s): CHOL, HDL, LDLCALC, TRIG, CHOLHDL, LDLDIRECT in the last 72 hours. Thyroid function studies Recent Labs    06/28/20 1359  TSH 1.726   Anemia work up No results for input(s): VITAMINB12, FOLATE, FERRITIN, TIBC, IRON, RETICCTPCT in the last 72 hours. Urinalysis    Component Value Date/Time   COLORURINE YELLOW (A) 07/03/2018 1027   APPEARANCEUR CLEAR (A) 07/03/2018 1027   APPEARANCEUR Clear 07/05/2014 2040   LABSPEC 1.016 07/03/2018 1027   LABSPEC 1.030 07/05/2014 2040   PHURINE 5.0 07/03/2018 1027   GLUCOSEU >=500 (A) 07/03/2018 1027   GLUCOSEU >=500 07/05/2014 2040   HGBUR SMALL (A) 07/03/2018 1027   BILIRUBINUR NEGATIVE 07/03/2018 1027   BILIRUBINUR Negative 07/05/2014 2040   KETONESUR 5 (A) 07/03/2018 1027   PROTEINUR 30 (A) 07/03/2018 1027   NITRITE NEGATIVE 07/03/2018 1027   LEUKOCYTESUR NEGATIVE 07/03/2018 1027   LEUKOCYTESUR Negative 07/05/2014 2040   Sepsis Labs Invalid input(s):  PROCALCITONIN,  WBC,  LACTICIDVEN Microbiology Recent Results (from the past 240 hour(s))  Respiratory Panel by RT PCR (Flu A&B, Covid) - Nasopharyngeal Swab     Status: None   Collection Time: 06/28/20  1:59 PM   Specimen: Nasopharyngeal Swab  Result Value Ref Range Status   SARS Coronavirus 2 by RT PCR NEGATIVE NEGATIVE Final    Comment: (NOTE) SARS-CoV-2 target nucleic acids are NOT DETECTED.  The SARS-CoV-2 RNA is generally detectable in upper respiratoy specimens during the acute phase of infection. The lowest concentration of SARS-CoV-2 viral copies this assay can detect is 131 copies/mL. A negative result does not preclude SARS-Cov-2 infection and should not be used as the sole basis for treatment or other patient management decisions. A negative result may occur with  improper specimen collection/handling, submission of specimen other than nasopharyngeal swab, presence of viral mutation(s) within the areas targeted by this assay, and inadequate number of viral copies (<131 copies/mL). A negative result must be combined with clinical observations, patient history, and epidemiological information. The expected result is Negative.  Fact Sheet for Patients:  https://www.moore.com/  Fact Sheet for Healthcare Providers:  https://www.young.biz/  This test is no t yet approved or cleared by the [place] FDA and  has been authorized for detection and/or diagnosis of SARS-CoV-2 by FDA under an Emergency Use Authorization (EUA). This EUA will remain  in effect (meaning this test can be used) for the duration of the COVID-19 declaration under Section 564(b)(1) of the Act, 21 U.S.C. section 360bbb-3(b)(1), unless the authorization is terminated or revoked sooner.     Influenza A by PCR NEGATIVE NEGATIVE Final   Influenza B by PCR NEGATIVE NEGATIVE Final    Comment: (NOTE) The Xpert Xpress SARS-CoV-2/FLU/RSV assay is intended as an aid in   the diagnosis of influenza from Nasopharyngeal swab specimens and  should not be used as a sole basis for treatment. Nasal washings and  aspirates are unacceptable for Xpert Xpress SARS-CoV-2/FLU/RSV  testing.  Fact Sheet for Patients: https://www.moore.com/  Fact Sheet for Healthcare Providers: https://www.young.biz/  This test is not yet approved or cleared by the [place] FDA and  has been  authorized for detection and/or diagnosis of SARS-CoV-2 by  FDA under an Emergency Use Authorization (EUA). This EUA will remain  in effect (meaning this test can be used) for the duration of the  Covid-19 declaration under Section 564(b)(1) of the Act, 21  U.S.C. section 360bbb-3(b)(1), unless the authorization is  terminated or revoked. Performed at [facility], [address], [place], [place] [address]      Time coordinating discharge: Over 30 minutes  SIGNED:   Charise Killian, MD  [facility] 06/29/2020, 12:29 PM Pager   If 7PM-7AM, please contact night-coverage [email or web address]

## 2020-06-29 NOTE — Progress Notes (Signed)
OT Cancellation Note  Patient Details Name: Tyler [name] MRN: [id number] DOB: 1946/04/12   Cancelled Treatment:    Reason Eval/Treat Not Completed: OT screened, no needs identified, will sign off. OT order received and chart reviewed. Pt noted with pending DC. Physical Therapy evaluation indicates pt is at or near baseline for functional mobility. Spoke with physical therapist who indicated pt at baseline level of functional independence for self care tasks. No skilled needs identified. Will sign off at this time. Thank you.  Rockney Ghee, M.S., OTR/L Ascom: [phone] 06/29/20, 12:45 PM

## 2020-06-29 NOTE — Evaluation (Signed)
Physical Therapy Evaluation Patient Details Name: Josuha Fontanez Goodwine MRN: [id number] DOB: 10/16/1945 Today's Date: 06/29/2020   History of Present Illness  74 yo Male admitted on 06/28/20 after near syncope episode. Diagnosed with sinus bradycardia likely from beta blockers. EKG shows sinus bradycardia with left anterior fascicular block and right bundle branch block. He presents with increased swelling in BLE and possible cellulitis. PMH significant for atrial flutter and diabetes;  Clinical Impression  74 yo Male reports being independent in all self care ADLs, still working at [employer] and driving independently, presents to hospital with near syncope episode as a result of bradycardia. Patient denies any chest pain or shortness of breath or dizziness. He reports feeling close to baseline. He does exhibit increased swelling in BLE. Patient is independent in bed mobility, transfers and gait. He ambulated around hospital >160 feet independently without AD with good reciprocal gait pattern. Gait speed 2.34 feet/sec indicating limited community ambulator speed. This likely could be as patient is wearing socks and not shoes. He was able to exhibit good reciprocal gait pattern. He is functioning at baseline and does not exhibit need for skilled PT Intervention at this time. Will sign off on orders and DC in house. Patient agreeable.    Follow Up Recommendations No PT follow up    Equipment Recommendations  None recommended by PT    Recommendations for Other Services       Precautions / Restrictions Precautions Precautions: None Restrictions Weight Bearing Restrictions: No      Mobility  Bed Mobility Overal bed mobility: Independent                Transfers Overall transfer level: Independent Equipment used: None                Ambulation/Gait Ambulation/Gait assistance: Independent Gait Distance (Feet): 175 Feet Assistive device: None Gait Pattern/deviations: WFL(Within  Functional Limits);Step-through pattern   Gait velocity interpretation: 1.31 - 2.62 ft/sec, indicative of limited community ambulator General Gait Details: Gait speed 2.34 feet/sec  Stairs            Wheelchair Mobility    Modified Rankin (Stroke Patients Only)       Balance Overall balance assessment: Independent                                           Pertinent Vitals/Pain Pain Assessment: No/denies pain    Home Living Family/patient expects to be discharged to:: Private residence Living Arrangements: Non-relatives/Friends Available Help at Discharge: Family Type of Home: House Home Access: Stairs to enter Entrance Stairs-Rails: Right;Left;Can reach both Secretary/administrator of Steps: 10 Home Layout: One level Home Equipment: Cane - single point      Prior Function Level of Independence: Independent         Comments: still working; driving, independent in all ADLs; no recent falls     Hand Dominance   Dominant Hand: Right    Extremity/Trunk Assessment   Upper Extremity Assessment Upper Extremity Assessment: Overall WFL for tasks assessed    Lower Extremity Assessment Lower Extremity Assessment: Overall WFL for tasks assessed    Cervical / Trunk Assessment Cervical / Trunk Assessment: Kyphotic  Communication   Communication: No difficulties  Cognition Arousal/Alertness: Awake/alert Behavior During Therapy: WFL for tasks assessed/performed Overall Cognitive Status: Within Functional Limits for tasks assessed  General Comments General comments (skin integrity, edema, etc.): does have swelling in BLE, both legs currently wrapped unable to assess further;    Exercises     Assessment/Plan    PT Assessment Patent does not need any further PT services  PT Problem List         PT Treatment Interventions      PT Goals (Current goals can be found in the Care Plan  section)  Acute Rehab PT Goals Patient Stated Goal: to go home PT Goal Formulation: With patient Time For Goal Achievement: 06/29/20 Potential to Achieve Goals: Good    Frequency     Barriers to discharge        Co-evaluation               AM-PAC PT "6 Clicks" Mobility  Outcome Measure Help needed turning from your back to your side while in a flat bed without using bedrails?: None Help needed moving from lying on your back to sitting on the side of a flat bed without using bedrails?: None Help needed moving to and from a bed to a chair (including a wheelchair)?: None Help needed standing up from a chair using your arms (e.g., wheelchair or bedside chair)?: None Help needed to walk in hospital room?: None Help needed climbing 3-5 steps with a railing? : None 6 Click Score: 24    End of Session Equipment Utilized During Treatment: Gait belt Activity Tolerance: Patient tolerated treatment well;No increased pain Patient left: in chair;with call bell/phone within reach Nurse Communication: Mobility status PT Visit Diagnosis: Muscle weakness (generalized) (M62.81)    Time: 1610-9604 PT Time Calculation (min) (ACUTE ONLY): 19 min   Charges:   PT Evaluation $PT Eval Low Complexity: 1 Low           Darice Vicario PT, DPT 06/29/2020, 12:30 PM

## 2020-06-29 NOTE — Progress Notes (Signed)
Blood pressure 189/47,MD made aware, treated with PRN hydralazine, BP now 156/43. Per MD ok to discharge. Discharge instructions explained/pt verbalized understanding. IV and tele removed. Will transport off unit via wheelchair.

## 2020-06-29 NOTE — Consult Note (Signed)
WOC Nurse Consult Note: Reason for Consult:Orders for compression. Patient wore Unna boots for 2 weeks he reports to manage chronic edema.  + pedal pulses. Left great toe amputation.  Left plantar foot beneath great toe is scabbed and calloused. Dry.  Dry gauze applied.  Wound type:Neuropathic Pressure Injury POA: NA Measurement: 2 cm x 1 cm scabbed lesion  Wound EQA:[id number] Drainage (amount, consistency, odor) none Periwound:Lichenification and hemosiderin staining present.  Dressing procedure/placement/frequency:LEgs cleansed with soap and water.  Wrapped with zinc layer and secured with self adherent coban. He sees MD in a week.  Instructed to leave wraps in place until follow up.  If they begin to unravel, he feels comfortable removing them after discharge.  Will not follow at this time.  Please re-consult if needed.  Maple Hudson MSN, RN, FNP-BC CWON Wound, Ostomy, Continence Nurse Pager [phone]

## 2020-06-29 NOTE — Progress Notes (Signed)
Patient Name: Tyler [name] Date of Encounter: 06/29/2020  Hospital Problem List     Principal Problem:   Drug-induced bradycardia Active Problems:   Syncope and collapse   Acute CHF (congestive heart failure) (HCC)   Diabetes mellitus without complication [facility])    Patient Profile     74 y.o. male with history of persistent atrial fibrillation, HFpEF who has been treated for his A. fib with metoprolol.  Has also been anticoagulated with Eliquis.  Has had his metoprolol decreased somewhat from 100 mg down to 25 mg twice daily.  This was due to relative bradycardia in his primary cardiologist office in [place].  He was on as much as Toprol-XL 100 mg twice daily however currently has had this decreased as mentioned above.  Was evaluated in the emergency room in [place] noted to have mild interstitial edema.  He has a history of chronic chest pain.  He was last seen by his cardiologist in August 2021.  At that time had his metoprolol decreased to 12.5 mg twice daily.  He had an echocardiogram in June of this year showing ejection fraction 55 to 60% with moderate aortic sclerosis no stenosis.  He had mitral annular calcification and moderate pulmonary hypertension with moderate MR.  Holter monitor placed in July 2021 revealed persistent A. fib with an average rate of 61 slowest of 44.   Subjective   Heart rate has improved to 47 this am witih persistent aflutter. No dizzyness or sob. Mild leg discomfort  Inpatient Medications    . apixaban  2.5 mg Oral BID  . furosemide  40 mg Intravenous Daily  . insulin aspart  0-15 Units Subcutaneous TID WC  . lisinopril  2.5 mg Oral Daily  . pantoprazole  40 mg Oral BID  . pravastatin  20 mg Oral QPM  . sodium chloride flush  3 mL Intravenous Q12H  . sodium chloride flush  3 mL Intravenous Q12H    Vital Signs    Vitals:   06/29/20 0000 06/29/20 0222 06/29/20 0400 06/29/20 0807  BP: (!) 200/63 (!) 188/56 (!) 167/43 (!) 190/59  Pulse: (!)  46 (!) 45 (!) 46 (!) 47  Resp: 20 20 18 19   Temp:  97.7 F (36.5 C)  97.9 F (36.6 C)  TempSrc:  Oral    SpO2: 100% 100% 95% 100%  Weight:  110 kg    Height:        Intake/Output Summary (Last 24 hours) at 06/29/2020 0813 Last data filed at 06/29/2020 0225 Gross per 24 hour  Intake --  Output 1425 ml  Net -1425 ml   Filed Weights   06/28/20 1358 06/28/20 2153 06/29/20 0222  Weight: 108.8 kg 110 kg 110 kg    Physical Exam    GEN: Well nourished, well developed, in no acute distress.  HEENT: normal.  Neck: Supple, no JVD, carotid bruits, or masses. Cardiac: RRR, no murmurs, rubs, or gallops. No clubbing, cyanosis, edema.  Radials/DP/PT 2+ and equal bilaterally.  Respiratory:  Respirations regular and unlabored, clear to auscultation bilaterally. GI: Soft, nontender, nondistended, BS + x 4. MS: no deformity or atrophy. Skin: warm and dry, no rash. Neuro:  Strength and sensation are intact. Psych: Normal affect.  Labs    CBC Recent Labs    06/28/20 1359 06/29/20 0524  WBC 11.9* 8.0  HGB 10.9* 11.1*  HCT 34.1* 33.1*  MCV 90.0 86.6  PLT 212 174   Basic Metabolic Panel Recent Labs  06/28/20 1359 06/29/20 0524  NA 136 139  K 5.0 4.7  CL 108 107  CO2 19* 25  GLUCOSE 157* 120*  BUN 22 23  CREATININE 1.12 0.97  CALCIUM 9.7 9.9   Liver Function Tests Recent Labs    06/28/20 1359  AST 19  ALT 23  ALKPHOS 67  BILITOT 0.9  PROT 6.8  ALBUMIN 3.8   No results for input(s): LIPASE, AMYLASE in the last 72 hours. Cardiac Enzymes No results for input(s): CKTOTAL, CKMB, CKMBINDEX, TROPONINI in the last 72 hours. BNP Recent Labs    06/28/20 2235  BNP 404.7*   D-Dimer No results for input(s): DDIMER in the last 72 hours. Hemoglobin A1C Recent Labs    06/28/20 1359  HGBA1C 5.5   Fasting Lipid Panel No results for input(s): CHOL, HDL, LDLCALC, TRIG, CHOLHDL, LDLDIRECT in the last 72 hours. Thyroid Function Tests Recent Labs    06/28/20 1359  TSH  1.726    Telemetry    Atrial flutter with slow vr  ECG    A flutter with slow vr.   Radiology    DG Chest Portable 1 View  Result Date: 06/28/2020 CLINICAL DATA:  Bradycardia EXAM: PORTABLE CHEST 1 VIEW COMPARISON:  July 03, 2018 FINDINGS: There are small pleural effusions bilaterally with mild interstitial pulmonary edema. No consolidation. There is cardiomegaly with pulmonary venous hypertension. No adenopathy. There is aortic atherosclerosis. No bone lesions. IMPRESSION: Cardiomegaly with pulmonary vascular congestion. Small pleural effusions with mild interstitial edema. Suspect a degree of congestive heart failure. No consolidation. Aortic Atherosclerosis (ICD10-I70.0). Electronically Signed   By: Bretta Bang III M.D.   On: 06/28/2020 14:27    Assessment & Plan    74 year old male with history of persistent atrial flutter who has been on a decreasing dose of metoprolol due to bradycardia and anticoagulation with Eliquis.  He presented to the emergency room after several syncopal episodes and noted to be in a flutter with slow ventricular response.  He apparently has been on either carvedilol or metoprolol with differing med lists.  He had his beta-blocker stopped his heart rate has improved somewhat to the upper 40s and he is asymptomatic with this.  He also has lower extremity cellulitis and has had Unna boots in place in the past currently not with Unna boots.  1.  Atrial flutter with slow ventricular response-continue remain off of beta-blockers or any other AV nodal medications.  Continue with Eliquis at 2.5 mg twice daily as he had bleeding on higher dose.  Not a candidate for a permanent pacemaker present although he may need one in the near future.  He is bradycardic but asymptomatic from this.  Concern over lower extremity cellulitis which would increase his risk slightly for pacemaker at this point  2.  Lower extremity cellulitis-would recommend wound evaluation and  Unna boots as an outpatient.  Appears stable from a cardiac standpoint for discharge.  Would recommend early outpatient follow-up with his primary cardiologist in [facility] for consideration for Holter monitor and/or permanent pacemaker if symptoms warrant. Signed, Darlin Priestly Caylor Tallarico MD 06/29/2020, 8:13 AM  Pager: [phone]

## 2020-07-02 ENCOUNTER — Other Ambulatory Visit: Payer: Self-pay

## 2020-07-02 ENCOUNTER — Emergency Department
Admission: EM | Admit: 2020-07-02 | Discharge: 2020-07-02 | Disposition: A | Discharge status: Home / Self Care | Payer: BLUE CROSS/BLUE SHIELD | Attending: Emergency Medicine | Admitting: Emergency Medicine

## 2020-07-02 ENCOUNTER — Encounter: Payer: Self-pay | Admitting: Emergency Medicine

## 2020-07-02 DIAGNOSIS — Z79899 Other long term (current) drug therapy: Secondary | ICD-10-CM | POA: Diagnosis not present

## 2020-07-02 DIAGNOSIS — Z7984 Long term (current) use of oral hypoglycemic drugs: Secondary | ICD-10-CM | POA: Diagnosis not present

## 2020-07-02 DIAGNOSIS — I110 Hypertensive heart disease with heart failure: Secondary | ICD-10-CM | POA: Insufficient documentation

## 2020-07-02 DIAGNOSIS — I509 Heart failure, unspecified: Secondary | ICD-10-CM | POA: Insufficient documentation

## 2020-07-02 DIAGNOSIS — Z7901 Long term (current) use of anticoagulants: Secondary | ICD-10-CM | POA: Insufficient documentation

## 2020-07-02 DIAGNOSIS — R110 Nausea: Secondary | ICD-10-CM | POA: Diagnosis present

## 2020-07-02 DIAGNOSIS — I10 Essential (primary) hypertension: Secondary | ICD-10-CM

## 2020-07-02 LAB — CBC WITH DIFFERENTIAL/PLATELET
Abs Immature Granulocytes: 0.04 10*3/uL (ref 0.00–0.07)
Basophils Absolute: 0.1 10*3/uL (ref 0.0–0.1)
Basophils Relative: 1 %
Eosinophils Absolute: 0.2 10*3/uL (ref 0.0–0.5)
Eosinophils Relative: 3 %
HCT: 35.3 % — ABNORMAL LOW (ref 39.0–52.0)
Hemoglobin: 11.3 g/dL — ABNORMAL LOW (ref 13.0–17.0)
Immature Granulocytes: 1 %
Lymphocytes Relative: 22 %
Lymphs Abs: 1.8 10*3/uL (ref 0.7–4.0)
MCH: 28.7 pg (ref 26.0–34.0)
MCHC: 32 g/dL (ref 30.0–36.0)
MCV: 89.6 fL (ref 80.0–100.0)
Monocytes Absolute: 0.7 10*3/uL (ref 0.1–1.0)
Monocytes Relative: 8 %
Neutro Abs: 5.3 10*3/uL (ref 1.7–7.7)
Neutrophils Relative %: 65 %
Platelets: 177 10*3/uL (ref 150–400)
RBC: 3.94 MIL/uL — ABNORMAL LOW (ref 4.22–5.81)
RDW: 17.8 % — ABNORMAL HIGH (ref 11.5–15.5)
WBC: 8.2 10*3/uL (ref 4.0–10.5)
nRBC: 0 % (ref 0.0–0.2)

## 2020-07-02 LAB — COMPREHENSIVE METABOLIC PANEL
ALT: 23 U/L (ref 0–44)
AST: 23 U/L (ref 15–41)
Albumin: 3.7 g/dL (ref 3.5–5.0)
Alkaline Phosphatase: 64 U/L (ref 38–126)
Anion gap: 6 (ref 5–15)
BUN: 17 mg/dL (ref 8–23)
CO2: 24 mmol/L (ref 22–32)
Calcium: 9.8 mg/dL (ref 8.9–10.3)
Chloride: 108 mmol/L (ref 98–111)
Creatinine, Ser: 0.73 mg/dL (ref 0.61–1.24)
GFR, Estimated: >60 mL/min (ref >60)
Glucose, Bld: 95 mg/dL (ref 70–99)
Potassium: 4 mmol/L (ref 3.5–5.1)
Sodium: 138 mmol/L (ref 135–145)
Total Bilirubin: 0.7 mg/dL (ref 0.3–1.2)
Total Protein: 7 g/dL (ref 6.5–8.1)

## 2020-07-02 MED ORDER — LISINOPRIL 5 MG PO TABS: 5.0000 mg | ORAL_TABLET | Freq: Every day | ORAL | 1 refills | Status: DC

## 2020-07-02 NOTE — ED Notes (Signed)
See triage note, pt reports to ED for HTN and bradycardia, recently seen for the same. Denies headache, vision changes, dizziness.  Alert and oriented, speaking in complete sentences.

## 2020-07-02 NOTE — ED Provider Notes (Signed)
[facility] Emergency Department Provider Note   ____________________________________________    I have reviewed the triage vital signs and the nursing notes.   HISTORY  Chief Complaint Hypertension     HPI Tyler [name] is a 74 y.o. male with a history of atrial fibrillation, diabetes, CHF who presents with complaints of elevated blood pressure.  Patient reports he was routinely checking his blood pressure this morning and noted it to be elevated with significant fluctuation in the diastolic pressure.  He has a long history of high blood pressure and is currently only on 2.5 mg of lisinopril.  Patient was recently admitted for significant bradycardia.  Review of records demonstrates beta-blockers were stopped and he has outpatient follow-up arranged.  He reports he has PCP follow-up in 4 days.  Past Medical History:  Diagnosis Date  . A-fib (HCC)   . Arthritis   . Diabetes mellitus without complication (HCC)   . Wears dentures    full upper    Patient Active Problem List   Diagnosis Date Noted  . Syncope and collapse 06/28/2020  . Acute CHF (congestive heart failure) (HCC) 06/28/2020  . 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  . Diabetes mellitus without complication (HCC)   . Sepsis (HCC) 07/03/2018    Past Surgical History:  Procedure Laterality Date  . CATARACT EXTRACTION W/PHACO Right 01/16/2017   Procedure: CATARACT EXTRACTION PHACO AND INTRAOCULAR LENS PLACEMENT (IOC)  Right Complicated;  Surgeon: Lockie Mola, MD;  Location: [facility];  Service: Ophthalmology;  Laterality: Right;  IVA Block Healon 5 malyugin vision blue Diabetic - oral meds  . METATARSAL HEAD EXCISION Left 07/05/2018   Procedure: RESECTION FIRST METATARSAL INFECTED BONE AND SOFT TISSUE;  Surgeon: Recardo Evangelist, [name];  [name];  Service: Podiatry;  Laterality: Left;  . TOE AMPUTATION Left   . TONSILLECTOMY      Prior to Admission  medications   Medication Sig Start Date End Date Taking? Authorizing Provider  ELIQUIS 2.5 MG TABS tablet Take 2.5 mg by mouth 2 (two) times daily. 06/13/20   [provider]  glipiZIDE (GLUCOTROL) 10 MG tablet Take 10 mg by mouth 2 (two) times daily. 06/20/18   [provider]  lisinopril (ZESTRIL) 5 MG tablet Take 1 tablet (5 mg total) by mouth daily. 07/02/20   Jene Every, MD  metFORMIN (GLUCOPHAGE) 1000 MG tablet Take 1,000 mg by mouth 2 (two) times daily. 06/17/18   [provider]  pantoprazole (PROTONIX) 40 MG tablet Take 40 mg by mouth 2 (two) times daily. 03/07/20   [provider]  pravastatin (PRAVACHOL) 20 MG tablet Take 20 mg by mouth every evening. 06/19/20   [provider]     Allergies Patient has no known allergies.  No family history on file.  Social History Social History   Tobacco Use  . Smoking status: Never Smoker  . Smokeless tobacco: Never Used  Substance Use Topics  . Alcohol use: No  . Drug use: Not on file    Review of Systems  Constitutional: No fever/chills Eyes: No visual changes.  ENT: No sore throat. Cardiovascular: No chest Respiratory: No shortness of Gastrointestinal: No abdominal pain.   Genitourinary: Negative for dysuria. Musculoskeletal: Negative for back pain. Skin: Negative for rash. Neurological: Negative for headaches   ____________________________________________   PHYSICAL EXAM:  VITAL SIGNS: ED Triage Vitals [07/02/20 1043]  Enc Vitals Group     BP (!) 205/57     Pulse Rate (!) 50  Resp 16     Temp 99.2 F (37.3 C)     Temp Source Oral     SpO2 99 %     Weight 109 kg (240 lb 4.8 oz)     Height 1.981 m (6\' 6" )     Head Circumference      Peak Flow      Pain Score 0     Pain Loc      Pain Edu?      Excl. in GC?     Constitutional: Alert and oriented.   Nose: No congestion/rhinnorhea. Mouth/Throat: Mucous membranes are moist.    Cardiovascular: Heart rate 50s  to 55, regular rhythm. Grossly normal heart sounds.  Good peripheral circulation. Respiratory: Normal respiratory effort.  No retractions. Lungs CTAB. Gastrointestinal: Soft and nontender. No distention.   Musculoskeletal: .  Warm and well perfused Neurologic:  Normal speech and language. No gross focal neurologic deficits are appreciated.  Skin:  Skin is warm, dry and intact. No rash noted. Psychiatric: Mood and affect are normal. Speech and behavior are normal.  ____________________________________________   LABS (all labs ordered are listed, but only abnormal results are displayed)  Labs Reviewed  CBC WITH DIFFERENTIAL/PLATELET - Abnormal; Notable for the following components:      Result Value   RBC 3.94 (*)    Hemoglobin 11.3 (*)    HCT 35.3 (*)    RDW 17.8 (*)    All other components within normal limits  COMPREHENSIVE METABOLIC PANEL   ____________________________________________  EKG  ED ECG REPORT I, , the attending physician, personally viewed and interpreted this ECG.  Date: 07/02/2020  Rhythm: Atrial flutter QRS Axis: normal Intervals: Abnormal ST/T Wave abnormalities: Nonspecific changes Narrative Interpretation: no evidence of acute ischemia  ____________________________________________  RADIOLOGY  None ____________________________________________   PROCEDURES  Procedure(s) performed: No  Procedures   Critical Care performed: No ____________________________________________   INITIAL IMPRESSION / ASSESSMENT AND PLAN / ED COURSE  Pertinent labs & imaging results that were available during my care of the patient were reviewed by me and considered in my medical decision making (see chart for details).  Patient here with complaints of asymptomatic high blood pressure.  He has a long history of high blood pressure and notes that sometimes it just gets very high and he is not quite sure why.  He reports that his unusual but usually  feels better when his blood pressure is above 200.09/01/2020  No chest pain no shortness of breath recent admission for bradycardia as described above however pulses have been 50-55 here which is fairly normal for him.  He does have close outpatient follow-up, we will double his lisinopril dose and he will see his PCP on Wednesday.  His lab work today is unremarkable, normal white blood cell count, unchanged hemoglobin, BUN/creatinine are normal.    ____________________________________________   FINAL CLINICAL IMPRESSION(S) / ED DIAGNOSES  Final diagnoses:  Primary hypertension        Note:  This document was prepared using Dragon voice recognition software and may include unintentional dictation errors.   Saturday, MD 07/02/20 1423

## 2020-07-02 NOTE — ED Triage Notes (Signed)
Pt to ED via POV c/o HTN. Pt states that he is on medication. Pt states that he is having some nausea. Pt just seen on 10/5 for bradycardia. Pt states that they took him off some of his medication but HR still staying around 49-50. Pt is in NAD.

## 2020-07-09 NOTE — Progress Notes (Deleted)
   Patient ID: Tyler [name], male    DOB: 12/05/1945, 74 y.o.   MRN: [id number]  HPI  Tyler [name] is a 74 y/o male with a history of  Echo report from 03/01/20 reviewed and showed an EF of 55-60% along with moderate Tyler, mild TR and moderately elevated PA pressure.   Was in the ED 07/02/20 due to elevated BP. Patient evaluated and released. Admitted 06/28/20 due to near syncope, weakness and bradycardia. EMS had HR between 20-25. Given atropine and IVF en route. Cardiology and wound consults obtained. Possible patient confusion regarding beta-blockers. Beta-blocker stopped. Discharged the following day.   He presents today for his initial visit with a chief complaint of   Review of Systems    Physical Exam  Assessment & Plan:  1: Chronic heart failure with preserved ejection fraction without structural changes- - NYHA class - saw cardiology Vivien Rossetti) - BNP 06/28/20 was 404.7  2: HTN- - BP - saw PCP Letitia Libra) 07/06/20 - BMP from 07/02/20 reviewed and showed sodium 138, potassium 4.0, creatinine 0.73 and GFR >60  3: DM- - A1c 06/28/20 was 5.5%  4: Atrial fibrillation-

## 2020-07-11 ENCOUNTER — Telehealth: Payer: Self-pay | Admitting: Family

## 2020-07-11 ENCOUNTER — Ambulatory Visit: Payer: BC Managed Care – PPO | Admitting: Family

## 2020-07-11 NOTE — Telephone Encounter (Signed)
Patient did not show for his [facility] Failure [facility] appointment on 07/11/20. Will attempt to reschedule.

## 2020-08-22 DIAGNOSIS — M069 Rheumatoid arthritis, unspecified: Secondary | ICD-10-CM | POA: Insufficient documentation

## 2020-08-22 DIAGNOSIS — Z8679 Personal history of other diseases of the circulatory system: Secondary | ICD-10-CM | POA: Insufficient documentation

## 2020-08-22 HISTORY — DX: Personal history of other diseases of the circulatory system: Z86.79

## 2020-09-07 HISTORY — PX: CARDIAC ELECTROPHYSIOLOGY STUDY AND ABLATION: SHX1294

## 2020-09-09 DIAGNOSIS — J90 Pleural effusion, not elsewhere classified: Secondary | ICD-10-CM

## 2020-09-09 HISTORY — DX: Pleural effusion, not elsewhere classified: J90

## 2020-09-10 DIAGNOSIS — A419 Sepsis, unspecified organism: Secondary | ICD-10-CM

## 2020-09-10 DIAGNOSIS — Z950 Presence of cardiac pacemaker: Secondary | ICD-10-CM

## 2020-09-10 HISTORY — DX: Presence of cardiac pacemaker: Z95.0

## 2020-09-10 HISTORY — DX: Sepsis, unspecified organism: A41.9

## 2020-09-15 HISTORY — PX: PACEMAKER INSERTION: SHX728

## 2020-09-15 HISTORY — PX: INSERT / REPLACE / REMOVE PACEMAKER: SUR710

## 2020-09-27 DIAGNOSIS — I483 Typical atrial flutter: Secondary | ICD-10-CM | POA: Insufficient documentation

## 2020-09-27 DIAGNOSIS — I890 Lymphedema, not elsewhere classified: Secondary | ICD-10-CM | POA: Insufficient documentation

## 2020-09-27 DIAGNOSIS — R339 Retention of urine, unspecified: Secondary | ICD-10-CM | POA: Insufficient documentation

## 2020-09-27 DIAGNOSIS — N390 Urinary tract infection, site not specified: Secondary | ICD-10-CM | POA: Insufficient documentation

## 2021-03-05 ENCOUNTER — Other Ambulatory Visit: Payer: Self-pay

## 2021-03-05 ENCOUNTER — Emergency Department
Admission: EM | Admit: 2021-03-05 | Discharge: 2021-03-05 | Disposition: A | Discharge status: Home / Self Care | Payer: BC Managed Care – PPO | Attending: Emergency Medicine | Admitting: Emergency Medicine

## 2021-03-05 DIAGNOSIS — Z7901 Long term (current) use of anticoagulants: Secondary | ICD-10-CM | POA: Insufficient documentation

## 2021-03-05 DIAGNOSIS — T83098A Other mechanical complication of other indwelling urethral catheter, initial encounter: Secondary | ICD-10-CM | POA: Insufficient documentation

## 2021-03-05 DIAGNOSIS — E119 Type 2 diabetes mellitus without complications: Secondary | ICD-10-CM | POA: Insufficient documentation

## 2021-03-05 DIAGNOSIS — Z7984 Long term (current) use of oral hypoglycemic drugs: Secondary | ICD-10-CM | POA: Insufficient documentation

## 2021-03-05 DIAGNOSIS — I509 Heart failure, unspecified: Secondary | ICD-10-CM | POA: Diagnosis not present

## 2021-03-05 DIAGNOSIS — Y846 Urinary catheterization as the cause of abnormal reaction of the patient, or of later complication, without mention of misadventure at the time of the procedure: Secondary | ICD-10-CM | POA: Insufficient documentation

## 2021-03-05 DIAGNOSIS — T83091A Other mechanical complication of indwelling urethral catheter, initial encounter: Secondary | ICD-10-CM

## 2021-03-05 HISTORY — DX: Retention of urine, unspecified: R33.9

## 2021-03-05 NOTE — ED Provider Notes (Signed)
[facility] Emergency Department Provider Note  ____________________________________________   Event Date/Time   First MD Initiated Contact with Patient 03/05/21 [phone]     (approximate)  I have reviewed the triage vital signs and the nursing notes.   HISTORY  Chief Complaint Urinary Retention    HPI Tyler [name] is a 75 y.o. 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  His catheter has not been draining over the last few hours and his abdomen feels very distended with lower abdominal/pelvic pain.  He has had this issue in the past where the catheter becomes clogged.  He said that the last catheter was placed about 3 weeks ago.  He said that his urologist has plans for placing a suprapubic catheter.  Nothing in particular makes his symptoms better or worse and they have been gradually getting worse over the last few hours.  The patient denies fever, sore throat, chest pain or shortness of breath, nausea, vomiting.  He recently had a cystoscopy but is not certain he understands the results other than being told he needs to continue having a catheter or suprapubic.     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   Diabetes mellitus without complication (HCC)    Sepsis (HCC) 07/03/2018    Past Surgical History:  Procedure Laterality Date   CATARACT EXTRACTION W/PHACO Right 01/16/2017   Procedure: CATARACT EXTRACTION PHACO AND INTRAOCULAR LENS PLACEMENT (IOC)  Right Complicated;  Surgeon:  Lockie Mola, MD;  Location: [facility];  Service: Ophthalmology;  Laterality: Right;  IVA Block Healon 5 malyugin vision blue Diabetic - oral meds   METATARSAL HEAD EXCISION Left 07/05/2018   Procedure: RESECTION FIRST METATARSAL INFECTED BONE AND SOFT TISSUE;  Surgeon: Recardo Evangelist, [name];  [name];  Service: Podiatry;  Laterality: Left;   TOE AMPUTATION Left    TONSILLECTOMY      Prior to Admission medications   Medication Sig Start Date End Date Taking? Authorizing Provider  ELIQUIS 2.5 MG TABS tablet Take 2.5 mg by mouth 2 (two) times daily. 06/13/20   [provider]  glipiZIDE (GLUCOTROL) 10 MG tablet Take 10 mg by mouth 2 (two) times daily. 06/20/18   [provider]  lisinopril (ZESTRIL) 5 MG tablet Take 1 tablet (5 mg total) by mouth daily. 07/02/20   Jene Every, MD  metFORMIN (GLUCOPHAGE) 1000 MG tablet Take 1,000 mg by mouth 2 (two) times daily. 06/17/18   [provider]  pantoprazole (PROTONIX) 40 MG tablet Take 40 mg by mouth 2 (two) times daily. 03/07/20   [provider]  pravastatin (PRAVACHOL) 20 MG tablet Take 20 mg by mouth every evening. 06/19/20   [provider]    Allergies Patient has no known allergies.  History reviewed. No pertinent family history.  Social History Social History   Tobacco Use   Smoking status: Never   Smokeless tobacco: Never  Substance Use Topics   Alcohol use: No    Review of Systems  Constitutional: No fever/chills Eyes: No visual changes. ENT: No sore throat. Cardiovascular: Denies chest pain. Respiratory: Denies shortness of breath. Gastrointestinal: Lower abdominal distention and pain. Genitourinary: Chronic urinary retention with indwelling Foley catheter that seems to be obstructed. Musculoskeletal: Negative for neck pain.  Negative for back pain. Integumentary: Negative for rash. Neurological: Negative for headaches, focal weakness or  numbness.   ____________________________________________   PHYSICAL EXAM:  VITAL SIGNS: ED Triage Vitals  Enc Vitals Group     BP 03/05/21 0017 (!) 190/77     Pulse Rate 03/05/21 0017 70     Resp 03/05/21 0017 18     Temp 03/05/21 0017 98.1 F (36.7 C)     Temp Source 03/05/21 0017 Oral     SpO2 03/05/21 0017 100 %     Weight 03/05/21 0017 104.3 kg (230 lb)     Height 03/05/21 0017 1.981 m (6\' 6" )     Head Circumference --      Peak Flow --      Pain Score 03/05/21 0040 8     Pain Loc --      Pain Edu? --      Excl. in GC? --     Constitutional: Alert and oriented.  Eyes: Conjunctivae are normal.  Head: Atraumatic. Nose: No congestion/rhinnorhea. Mouth/Throat: Patient is wearing a mask. Neck: No stridor.  No meningeal signs.   Cardiovascular: Normal rate, regular rhythm. Good peripheral circulation. Respiratory: Normal respiratory effort.  No retractions. Gastrointestinal: Soft but mildly distended.  Some tenderness to palpation of the suprapubic region. Genitourinary: 68 French Foley catheter is in place and does not seem to be draining. Musculoskeletal: No lower extremity tenderness nor edema. No gross deformities of extremities. Neurologic:  Normal speech and language. No gross focal neurologic deficits are appreciated.  Skin:  Skin is warm, dry and intact. Psychiatric: Mood and affect are normal. Speech and behavior are normal.  ____________________________________________    INITIAL IMPRESSION / MDM / ASSESSMENT AND PLAN / ED COURSE  As part of my medical decision making, I reviewed the following data within the electronic MEDICAL RECORD NUMBER Nursing notes reviewed and incorporated and Notes from prior ED visits   Differential diagnosis includes, but is not limited to, urinary catheter malfunction or obstruction, UTI, bladder outlet obstruction, cancer.  Patient was not having problems until the apparent malfunction of his Foley.  He is managed closely by  urology.  He is not having signs or symptoms of infection and a urinalysis in this patient with a chronic indwelling Foley will almost certainly be positive but does not necessarily reflect an acute infection.  I will not send a urinalysis at this time.  We will replace the Foley catheter and see if this relieves his symptoms and then reassess if anything else is needed.  He is hypertensive but this is likely chronic as well as acute due to his obstruction and discomfort.  He agrees with the plan.       Clinical Course as of 03/05/21 05/05/21  6659 Mar 05, 2021  0202 Patient feels much better after Foley has been swapped out.  He drained a substantial amount of urine into the leg bag.  He will follow-up with his urologist.  I gave my usual and customary return precautions. [CF]  0206 Given the bloody appearance of the urine which could be due to trauma or due to the chronic Foley, I will send a urine culture, but as previously described, he has no other signs  or symptoms of systemic infection and I do not want to empirically treat what is not an active infection. [CF]    Clinical Course User Index [CF] Loleta Rose, MD     ____________________________________________  FINAL CLINICAL IMPRESSION(S) / ED DIAGNOSES  Final diagnoses:  Obstruction of Foley catheter, initial encounter [facility])     MEDICATIONS GIVEN DURING THIS VISIT:  Medications - No data to display   ED Discharge Orders     None        Note:  This document was prepared using Dragon voice recognition software and may include unintentional dictation errors.   Loleta Rose, MD 03/05/21 [phone]

## 2021-03-05 NOTE — ED Triage Notes (Signed)
Pt arrives to ED from home via PoV with c/c of urinary retention. Pt has indwelling catheter, last noted output was at around 1600 on Saturday afternoon. Current catheter placed approx 3 weeks ago. Pt has Hx of occluded catheters in the past. Pt states his bladder feels extremely full and is currently experiencing 7/10 pain. Pt denies F/C, NVD. Upon assessment, pt A&Ox4.

## 2021-03-05 NOTE — ED Notes (Signed)
Pt presents with 66fr foley in place with leg bag. Pt states he is having a  Bladder procedure in a month but feels like the foley is not draining. Bladder distended with palpation. Foley removed and replaced by this RN and [place] NT.

## 2021-03-07 LAB — URINE CULTURE
Culture: >=100000 — AB
Special Requests: NORMAL

## 2021-03-10 ENCOUNTER — Emergency Department
Admission: EM | Admit: 2021-03-10 | Discharge: 2021-03-10 | Disposition: A | Discharge status: Home / Self Care | Payer: BC Managed Care – PPO | Attending: Emergency Medicine | Admitting: Emergency Medicine

## 2021-03-10 ENCOUNTER — Other Ambulatory Visit: Payer: Self-pay

## 2021-03-10 DIAGNOSIS — R339 Retention of urine, unspecified: Secondary | ICD-10-CM | POA: Insufficient documentation

## 2021-03-10 DIAGNOSIS — T83098A Other mechanical complication of other indwelling urethral catheter, initial encounter: Secondary | ICD-10-CM | POA: Diagnosis not present

## 2021-03-10 DIAGNOSIS — Z7901 Long term (current) use of anticoagulants: Secondary | ICD-10-CM | POA: Insufficient documentation

## 2021-03-10 DIAGNOSIS — I509 Heart failure, unspecified: Secondary | ICD-10-CM | POA: Diagnosis not present

## 2021-03-10 DIAGNOSIS — Z7984 Long term (current) use of oral hypoglycemic drugs: Secondary | ICD-10-CM | POA: Diagnosis not present

## 2021-03-10 DIAGNOSIS — E119 Type 2 diabetes mellitus without complications: Secondary | ICD-10-CM | POA: Insufficient documentation

## 2021-03-10 DIAGNOSIS — T83091A Other mechanical complication of indwelling urethral catheter, initial encounter: Secondary | ICD-10-CM

## 2021-03-10 NOTE — ED Provider Notes (Signed)
[facility] Emergency Department Provider Note   ____________________________________________   Event Date/Time   First MD Initiated Contact with Patient 03/10/21 [phone]     (approximate)  I have reviewed the triage vital signs and the nursing notes.   HISTORY  Chief Complaint Urinary Retention    HPI Tyler [name] is a 75 y.o. male with the below stated past medical history who presents for the sensation of urinary retention.  Patient states that he currently has a Foley catheter in place before having a "urologic procedure".  Patient states that he has been feeling distention in his lower abdomen for the past 5 hours and is concerned that his Foley catheter is not draining.  Patient also states that he has been having bleeding around the Foley catheter that has been intermittent since being placed.  Patient currently denies any vision changes, tinnitus, difficulty speaking, facial droop, sore throat, chest pain, shortness of breath, nausea/vomiting/diarrhea, dysuria, or weakness/numbness/paresthesias in any extremity         Past Medical History:  Diagnosis Date   A-fib (HCC)    Arthritis    Diabetes mellitus without complication (HCC)    Urinary retention    chronic, with indwelling Foley catheter and plans for a suprapubic   Wears dentures    full upper    Patient Active Problem List   Diagnosis Date Noted   Syncope and collapse 06/28/2020   Acute CHF (congestive heart failure) (HCC) 06/28/2020   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   Diabetes mellitus without complication (HCC)    Sepsis (HCC) 07/03/2018    Past Surgical History:  Procedure Laterality Date   CATARACT EXTRACTION W/PHACO Right 01/16/2017   Procedure: CATARACT EXTRACTION PHACO AND INTRAOCULAR LENS PLACEMENT (IOC)  Right Complicated;  Surgeon: Lockie Mola, MD;  Location: [facility];  Service: Ophthalmology;  Laterality: Right;  IVA Block Healon 5 malyugin  vision blue Diabetic - oral meds   METATARSAL HEAD EXCISION Left 07/05/2018   Procedure: RESECTION FIRST METATARSAL INFECTED BONE AND SOFT TISSUE;  Surgeon: Recardo Evangelist, [name];  [name];  Service: Podiatry;  Laterality: Left;   TOE AMPUTATION Left    TONSILLECTOMY      Prior to Admission medications   Medication Sig Start Date End Date Taking? Authorizing Provider  ELIQUIS 2.5 MG TABS tablet Take 2.5 mg by mouth 2 (two) times daily. 06/13/20   [provider]  glipiZIDE (GLUCOTROL) 10 MG tablet Take 10 mg by mouth 2 (two) times daily. 06/20/18   [provider]  lisinopril (ZESTRIL) 5 MG tablet Take 1 tablet (5 mg total) by mouth daily. 07/02/20   Jene Every, MD  metFORMIN (GLUCOPHAGE) 1000 MG tablet Take 1,000 mg by mouth 2 (two) times daily. 06/17/18   [provider]  pantoprazole (PROTONIX) 40 MG tablet Take 40 mg by mouth 2 (two) times daily. 03/07/20   [provider]  pravastatin (PRAVACHOL) 20 MG tablet Take 20 mg by mouth every evening. 06/19/20   [provider]    Allergies Patient has no known allergies.  No family history on file.  Social History Social History   Tobacco Use   Smoking status: Never   Smokeless tobacco: Never  Substance Use Topics   Alcohol use: No    Review of Systems Constitutional: No fever/chills Eyes: No visual changes. ENT: No sore throat. Cardiovascular: Denies chest pain. Respiratory: Denies shortness of breath. Gastrointestinal: Endorses suprapubic pain.  No nausea, no vomiting.  No diarrhea. Genitourinary:  Negative for dysuria. Musculoskeletal: Negative for acute arthralgias Skin: Negative for rash. Neurological: Negative for headaches, weakness/numbness/paresthesias in any extremity Psychiatric: Negative for suicidal ideation/homicidal ideation   ____________________________________________   PHYSICAL EXAM:  VITAL SIGNS: ED Triage Vitals  Enc Vitals Group     BP  03/10/21 0605 (!) 195/88     Pulse Rate 03/10/21 0603 78     Resp 03/10/21 0603 16     Temp 03/10/21 0603 98.6 F (37 C)     Temp Source 03/10/21 0603 Oral     SpO2 03/10/21 0603 97 %     Weight 03/10/21 0603 229 lb 4.5 oz (104 kg)     Height 03/10/21 0603 6\' 6"  (1.981 m)     Head Circumference --      Peak Flow --      Pain Score 03/10/21 0603 8     Pain Loc --      Pain Edu? --      Excl. in GC? --    Constitutional: Alert and oriented. Well appearing and in no acute distress. Eyes: Conjunctivae are normal. PERRL. Head: Atraumatic. Nose: No congestion/rhinnorhea. Mouth/Throat: Mucous membranes are moist. Neck: No stridor Cardiovascular: Grossly normal heart sounds.  Good peripheral circulation. Respiratory: Normal respiratory effort.  No retractions. Gastrointestinal: Soft and mild tenderness to palpation suprapubic region. No distention. Genitourinary: Foley catheter in place with a small amount of blood around the urethra.  Normal uncircumcised external male genitalia Musculoskeletal: No obvious deformities Neurologic:  Normal speech and language. No gross focal neurologic deficits are appreciated. Skin:  Skin is warm and dry. No rash noted. Psychiatric: Mood and affect are normal. Speech and behavior are normal.  ____________________________________________   LABS (all labs ordered are listed, but only abnormal results are displayed)  Labs Reviewed - No data to display ____________________________________________  PROCEDURES  Procedure(s) performed (including Critical Care):  Procedures   ____________________________________________   INITIAL IMPRESSION / ASSESSMENT AND PLAN / ED COURSE  As part of my medical decision making, I reviewed the following data within the electronic MEDICAL RECORD NUMBER Nursing notes reviewed and incorporated, Labs reviewed, EKG interpreted, Old chart reviewed, Radiograph reviewed and Notes from prior ED visits reviewed and  incorporated        75 year old male with a history of chronic indwelling Foley presents with 5 hours of the sensation of urinary retention. Well appearing/nonseptic. Tolerating PO.  ED Workup: UA ED Interventions: Catheter replacement  Patient exam and history not consistent with cauda equina, infectious etiology, constipation based retention/intraabdominal mass/AAA, trauma, nephro/urolithiasis, drug reaction, cancer.  Disposition: Discharge with catheter placed to leg bag and urology follow up within 1 week. Strict return precautions and catheter care discussed.      ____________________________________________   FINAL CLINICAL IMPRESSION(S) / ED DIAGNOSES  Final diagnoses:  Urinary retention  Obstruction of Foley catheter, initial encounter [facility])     ED Discharge Orders     None        Note:  This document was prepared using Dragon voice recognition software and may include unintentional dictation errors.    [facility], MD 03/11/21 [phone]

## 2021-03-10 NOTE — ED Notes (Addendum)
EDT Mary to bladder scan patient, currently in room. Primary RN aware of pt placement into room.

## 2021-03-10 NOTE — ED Triage Notes (Addendum)
To ED via POV c/o urinary retention for last 5 hours. Hx of BPH. Has foley catheter in currently and states that it is to stay in until he has his "procedure" regarding urinary retention.

## 2021-03-28 ENCOUNTER — Other Ambulatory Visit: Payer: Self-pay

## 2021-03-28 ENCOUNTER — Emergency Department
Admission: EM | Admit: 2021-03-28 | Discharge: 2021-03-28 | Disposition: A | Discharge status: Home / Self Care | Payer: BC Managed Care – PPO | Attending: Emergency Medicine | Admitting: Emergency Medicine

## 2021-03-28 DIAGNOSIS — Z7984 Long term (current) use of oral hypoglycemic drugs: Secondary | ICD-10-CM | POA: Diagnosis not present

## 2021-03-28 DIAGNOSIS — E1136 Type 2 diabetes mellitus with diabetic cataract: Secondary | ICD-10-CM | POA: Insufficient documentation

## 2021-03-28 DIAGNOSIS — Z79899 Other long term (current) drug therapy: Secondary | ICD-10-CM | POA: Insufficient documentation

## 2021-03-28 DIAGNOSIS — T83098A Other mechanical complication of other indwelling urethral catheter, initial encounter: Secondary | ICD-10-CM | POA: Insufficient documentation

## 2021-03-28 DIAGNOSIS — R339 Retention of urine, unspecified: Secondary | ICD-10-CM | POA: Diagnosis not present

## 2021-03-28 DIAGNOSIS — Y846 Urinary catheterization as the cause of abnormal reaction of the patient, or of later complication, without mention of misadventure at the time of the procedure: Secondary | ICD-10-CM | POA: Diagnosis not present

## 2021-03-28 DIAGNOSIS — Z7901 Long term (current) use of anticoagulants: Secondary | ICD-10-CM | POA: Diagnosis not present

## 2021-03-28 DIAGNOSIS — T839XXA Unspecified complication of genitourinary prosthetic device, implant and graft, initial encounter: Secondary | ICD-10-CM

## 2021-03-28 DIAGNOSIS — I509 Heart failure, unspecified: Secondary | ICD-10-CM | POA: Diagnosis not present

## 2021-03-28 NOTE — ED Notes (Signed)
Leg bag attached.  

## 2021-03-28 NOTE — ED Provider Notes (Signed)
[facility] EMERGENCY DEPARTMENT Provider Note   CSN: [id number] Arrival date & time: 03/28/21  1702     History Chief Complaint  Patient presents with   Catheter exchange    Tyler [name] is a 75 y.o. male presents to the emergency department for evaluation of his Foley catheter's inability to drain urine.  He had a Foley catheter placed several weeks ago, scheduled to see urologist but today noticed he was unable to have any drainage and feels as if he needs to urinate.  Denies any fevers, back pain, nausea vomiting.  No pain with urination.  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   Diabetes mellitus without complication (HCC)    Sepsis (HCC) 07/03/2018    Past Surgical History:  Procedure Laterality Date   CATARACT EXTRACTION W/PHACO Right 01/16/2017   Procedure: CATARACT EXTRACTION PHACO AND INTRAOCULAR LENS PLACEMENT (IOC)  Right Complicated;  Surgeon: Lockie Mola, MD;  Location: [facility];  Service: Ophthalmology;  Laterality: Right;  IVA Block Healon 5 malyugin vision blue Diabetic - oral meds   METATARSAL HEAD EXCISION Left 07/05/2018   Procedure: RESECTION FIRST METATARSAL INFECTED BONE AND SOFT TISSUE;  Surgeon: Recardo Evangelist, [name];  [name];  Service: Podiatry;  Laterality: Left;   TOE AMPUTATION Left    TONSILLECTOMY         No family history on file.  Social History   Tobacco Use   Smoking status: Never   Smokeless tobacco: Never  Substance Use Topics   Alcohol use: No    Home Medications Prior to Admission  medications   Medication Sig Start Date End Date Taking? Authorizing Provider  ELIQUIS 2.5 MG TABS tablet Take 2.5 mg by mouth 2 (two) times daily. 06/13/20   [provider]  glipiZIDE (GLUCOTROL) 10 MG tablet Take 10 mg by mouth 2 (two) times daily. 06/20/18   [provider]  lisinopril (ZESTRIL) 5 MG tablet Take 1 tablet (5 mg total) by mouth daily. 07/02/20   Jene Every, MD  metFORMIN (GLUCOPHAGE) 1000 MG tablet Take 1,000 mg by mouth 2 (two) times daily. 06/17/18   [provider]  pantoprazole (PROTONIX) 40 MG tablet Take 40 mg by mouth 2 (two) times daily. 03/07/20   [provider]  pravastatin (PRAVACHOL) 20 MG tablet Take 20 mg by mouth every evening. 06/19/20   [provider]    Allergies    Patient has no known allergies.  Review of Systems   Review of Systems  Constitutional:  Negative for chills and fever.  Gastrointestinal:  Negative for abdominal pain, nausea and vomiting.  Genitourinary:  Negative for flank pain, frequency and hematuria.  Skin:  Negative for rash.   Physical Exam Updated Vital Signs BP (!) 188/80 (BP Location: Right Arm)   Pulse 81   Temp 98.3 F (36.8 C) (Oral)   Resp 16   Ht 6\' 6"  (1.981 m)   Wt 102.1 kg   SpO2 97%   BMI 26.00 kg/m   Physical Exam  Constitutional:      Appearance: He is well-developed.  HENT:     Head: Normocephalic and atraumatic.  Eyes:     Conjunctiva/sclera: Conjunctivae normal.  Cardiovascular:     Rate and Rhythm: Normal rate.  Pulmonary:     Effort: Pulmonary effort is normal. No respiratory distress.  Abdominal:     General: There is no distension.     Tenderness: There is no abdominal tenderness. There is no right CVA tenderness, left CVA tenderness or guarding.  Musculoskeletal:        General: Normal range of motion.     Cervical back: Normal range of motion.  Skin:    General: Skin is warm.     Findings: No rash.  Neurological:     Mental Status: He is  alert and oriented to person, place, and time.  Psychiatric:        Behavior: Behavior normal.        Thought Content: Thought content normal.    ED Results / Procedures / Treatments   Labs (all labs ordered are listed, but only abnormal results are displayed) Labs Reviewed - No data to display  EKG None  Radiology No results found.  Procedures Procedures   Medications Ordered in ED Medications - No data to display  ED Course  I have reviewed the triage vital signs and the nursing notes.  Pertinent labs & imaging results that were available during my care of the patient were reviewed by me and considered in my medical decision making (see chart for details).    MDM Rules/Calculators/A&P                         75 year old male with Foley catheter placed several weeks ago.  Has a follow-up appointment with urologist but unfortunately today developed retention and was unable to drain properly.  Patient with no urinary symptoms, back pain, fevers, chills.  Foley catheter was removed and a 16 French Foley placed successfully with normal drainage.  The pressure patient was feeling was completely alleviated.  Patient asymptomatic.  Patient will follow-up with urologist.  He understands signs symptoms return to the ER for.  Final Clinical Impression(s) / ED Diagnoses Final diagnoses:  Urinary retention  Problem with Foley catheter, initial encounter [facility])    Rx / DC Orders ED Discharge Orders     None        Ronnette Juniper 03/28/21 Carlis Stable    Sharman Cheek, MD 03/28/21 2123

## 2021-03-28 NOTE — ED Triage Notes (Signed)
Pt is here to have his urinary catheter changed.

## 2021-03-28 NOTE — ED Notes (Signed)
See triage note  States his catheter stopped draining earlier today

## 2021-03-28 NOTE — Discharge Instructions (Addendum)
Please call urologist to schedule follow-up appointment.  Return to the ER for any worsening symptoms or urgent changes in health.

## 2021-04-04 ENCOUNTER — Other Ambulatory Visit: Payer: Self-pay

## 2021-04-04 ENCOUNTER — Emergency Department
Admission: EM | Admit: 2021-04-04 | Discharge: 2021-04-04 | Disposition: A | Discharge status: Home / Self Care | Payer: BC Managed Care – PPO | Attending: Emergency Medicine | Admitting: Emergency Medicine

## 2021-04-04 DIAGNOSIS — Z7984 Long term (current) use of oral hypoglycemic drugs: Secondary | ICD-10-CM | POA: Diagnosis not present

## 2021-04-04 DIAGNOSIS — R339 Retention of urine, unspecified: Secondary | ICD-10-CM | POA: Diagnosis not present

## 2021-04-04 DIAGNOSIS — Y846 Urinary catheterization as the cause of abnormal reaction of the patient, or of later complication, without mention of misadventure at the time of the procedure: Secondary | ICD-10-CM | POA: Diagnosis not present

## 2021-04-04 DIAGNOSIS — Z7901 Long term (current) use of anticoagulants: Secondary | ICD-10-CM | POA: Diagnosis not present

## 2021-04-04 DIAGNOSIS — I4891 Unspecified atrial fibrillation: Secondary | ICD-10-CM | POA: Insufficient documentation

## 2021-04-04 DIAGNOSIS — E119 Type 2 diabetes mellitus without complications: Secondary | ICD-10-CM | POA: Insufficient documentation

## 2021-04-04 DIAGNOSIS — I509 Heart failure, unspecified: Secondary | ICD-10-CM | POA: Diagnosis not present

## 2021-04-04 DIAGNOSIS — T83098A Other mechanical complication of other indwelling urethral catheter, initial encounter: Secondary | ICD-10-CM | POA: Diagnosis present

## 2021-04-04 DIAGNOSIS — T83011A Breakdown (mechanical) of indwelling urethral catheter, initial encounter: Secondary | ICD-10-CM

## 2021-04-04 LAB — URINALYSIS, COMPLETE (UACMP) WITH MICROSCOPIC
Bilirubin Urine: NEGATIVE
Glucose, UA: NEGATIVE mg/dL
Ketones, ur: NEGATIVE mg/dL
Nitrite: NEGATIVE
Protein, ur: NEGATIVE mg/dL
Specific Gravity, Urine: 1.014 (ref 1.005–1.030)
pH: 8 (ref 5.0–8.0)

## 2021-04-04 NOTE — ED Provider Notes (Signed)
[facility] Emergency Department Provider Note ____________________________________________   Event Date/Time   First MD Initiated Contact with Patient 04/04/21 0235     (approximate)  I have reviewed the triage vital signs and the nursing notes.  HISTORY  Chief Complaint Urinary Retention   HPI Tyler [name] is a 75 y.o. malewho presents to the ED for evaluation of urinary retention.   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   He presents to the ED today for evaluation of poorly draining indwelling urinary catheter.  He reports his catheter has only been in place for a couple weeks, but this afternoon on 7/11 he noted that the catheter did not seem to be draining as much as he is accustomed to.  He reports increasing sensation of suprapubic fullness and the need to void, but his Foley catheter not draining significant or expected amount of urine in his years of experience with a Foley catheter in place.  Due to these increasing sensations and continued nondraining, he presents to the ED for evaluation.  In triage she was noted to have greater than 200 cc of urine in his bladder despite the indwelling Foley catheter that he arrived with.  This was exchanged for a fresh catheter by our nursing team and immediately put out 6 or 700 cc of urine and he reports resolution of symptoms thereafter.  By the time that I see the patient, he is drained about 1600 cc of urine into this new Foley catheter and reports feeling fine and at baseline.  No recent fevers, illnesses, flank pain, emesis.   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   Diabetes mellitus without complication (HCC)    Sepsis (HCC) 07/03/2018    Past Surgical History:  Procedure Laterality Date   CATARACT EXTRACTION W/PHACO Right 01/16/2017   Procedure: CATARACT EXTRACTION PHACO AND INTRAOCULAR LENS PLACEMENT (IOC)  Right Complicated;  Surgeon: Lockie Mola, MD;  Location: [facility];  Service: Ophthalmology;  Laterality: Right;  IVA Block Healon 5 malyugin vision blue Diabetic - oral meds   METATARSAL HEAD EXCISION Left 07/05/2018   Procedure: RESECTION FIRST METATARSAL INFECTED BONE AND SOFT TISSUE;  Surgeon: Recardo Evangelist, [name];  [name];  Service: Podiatry;  Laterality: Left;   TOE AMPUTATION Left    TONSILLECTOMY      Prior to Admission medications   Medication Sig Start Date End Date Taking? Authorizing Provider  ELIQUIS 2.5 MG TABS tablet Take 2.5 mg by mouth 2 (two) times daily. 06/13/20   [provider]  glipiZIDE (GLUCOTROL) 10 MG tablet Take 10 mg by mouth 2 (two) times daily. 06/20/18   [provider]  lisinopril (ZESTRIL) 5 MG tablet Take 1 tablet (5 mg total) by mouth daily. 07/02/20   Jene Every, MD  metFORMIN (GLUCOPHAGE) 1000 MG tablet Take 1,000 mg by mouth 2 (two) times daily. 06/17/18   [provider]  pantoprazole (PROTONIX) 40 MG tablet Take 40 mg by mouth  2 (two) times daily. 03/07/20   [provider]  pravastatin (PRAVACHOL) 20 MG tablet Take 20 mg by mouth every evening. 06/19/20   [provider]    Allergies Patient has no known allergies.  No family history on file.  Social History Social History   Tobacco Use   Smoking status: Never   Smokeless tobacco: Never  Substance Use Topics   Alcohol use: No    Review of Systems  Constitutional: No fever/chills Eyes: No visual  changes. ENT: No sore throat. Cardiovascular: Denies chest pain. Respiratory: Denies shortness of breath. Gastrointestinal: Positive for suprapubic fullness sensation   No nausea, no vomiting.  No diarrhea.  No constipation. Genitourinary: Negative for dysuria. Musculoskeletal: Negative for back pain. Skin: Negative for rash. Neurological: Negative for headaches, focal weakness or numbness.  ____________________________________________   PHYSICAL EXAM:  VITAL SIGNS: Vitals:   04/04/21 0034  BP: (!) 190/81  Pulse: 81  Resp: 17  Temp: 98.8 F (37.1 C)  SpO2: 93%     Constitutional: Alert and oriented. Well appearing and in no acute distress. Eyes: Conjunctivae are normal. PERRL. EOMI. Head: Atraumatic. Nose: No congestion/rhinnorhea. Mouth/Throat: Mucous membranes are moist.  Oropharynx non-erythematous. Neck: No stridor. No cervical spine tenderness to palpation. Cardiovascular: Normal rate, regular rhythm. Grossly normal heart sounds.  Good peripheral circulation. Respiratory: Normal respiratory effort.  No retractions. Lungs CTAB. Gastrointestinal: Soft , nondistended, nontender to palpation. No CVA tenderness. Musculoskeletal: No lower extremity tenderness nor edema.  No joint effusions. No signs of acute trauma. Neurologic:  Normal speech and language. No gross focal neurologic deficits are appreciated. No gait instability noted. Skin:  Skin is warm, dry and intact. No rash noted. Psychiatric: Mood and affect are normal. Speech and behavior are normal.  ____________________________________________   LABS (all labs ordered are listed, but only abnormal results are displayed)  Labs Reviewed  URINALYSIS, COMPLETE (UACMP) WITH MICROSCOPIC - Abnormal; Notable for the following components:      Result Value   Color, Urine YELLOW (*)    APPearance CLOUDY (*)    Hgb urine dipstick SMALL (*)    Leukocytes,Ua LARGE (*)    Bacteria, UA RARE (*)    All other components  within normal limits   ____________________________________________  12 Lead EKG   ____________________________________________  RADIOLOGY  ED MD interpretation:    Official radiology report(s): No results found.  ____________________________________________   PROCEDURES and INTERVENTIONS  Procedure(s) performed (including Critical Care):  Procedures  Medications - No data to display  ____________________________________________   MDM / ED COURSE   Pleasant 75 year old male with chronic indwelling Foley presents to the ED with Foley malfunction and poorly draining catheter requiring replacement, and ultimately amenable to outpatient management.  Patient with signs of urinary retention despite pre-existing indwelling Foley catheter.  This was exchanged for a new catheter with significant output of urine and relief of his symptoms.  He is now asymptomatic and appears well.  Urinalysis with leukocytes that I anticipate represents colonization.  Considering his lack of additional symptoms and otherwise well-appearing status, we will send his urine for a culture and withhold antibiotics at this time.  Return precautions for the ED were discussed.     ____________________________________________   FINAL CLINICAL IMPRESSION(S) / ED DIAGNOSES  Final diagnoses:  Malfunction of Foley catheter, initial encounter [facility])     ED Discharge Orders     None        Bettina Warn Katrinka Blazing   Note:  This document was prepared  using Conservation officer, historic buildings and may include unintentional dictation errors.    Delton Prairie, MD 04/04/21 [phone]

## 2021-04-04 NOTE — ED Triage Notes (Signed)
Pt here for urinary retention states has had catheter for weeks, was here 7/5 for same thing and had it changed. States today around 1500 he noted less drainage.

## 2021-04-04 NOTE — ED Notes (Signed)
Leg bag attached to pts foley at this time.

## 2021-04-04 NOTE — ED Notes (Signed)
Bladder scan shows over of urine with catheter in place. Attempted to irrigate catheter without success. Foley replaced and drained of urine.

## 2021-04-06 LAB — URINE CULTURE: Culture: >=100000 — AB

## 2021-04-07 NOTE — Progress Notes (Signed)
Brief Pharmacy Note  Pharmacist reviewed ED Culture Report. Urine culture with proteus mirabilis. Patient presented with urinary retention, has indwelling Foley which was replaced with immediate urine output. Patient denied any symptoms of UTI.  Spoke with EDP Katrinka Blazing. Reviewed urine culture. H/o the same one month prior. Agree most likely represents colonization and no need to treat as patient is asymptomatic.  Laureen Ochs, PharmD

## 2021-04-16 ENCOUNTER — Other Ambulatory Visit: Payer: Self-pay

## 2021-04-16 ENCOUNTER — Emergency Department
Admission: EM | Admit: 2021-04-16 | Discharge: 2021-04-16 | Disposition: A | Discharge status: Home / Self Care | Payer: BC Managed Care – PPO | Attending: Emergency Medicine | Admitting: Emergency Medicine

## 2021-04-16 DIAGNOSIS — Z79899 Other long term (current) drug therapy: Secondary | ICD-10-CM | POA: Insufficient documentation

## 2021-04-16 DIAGNOSIS — Z7984 Long term (current) use of oral hypoglycemic drugs: Secondary | ICD-10-CM | POA: Diagnosis not present

## 2021-04-16 DIAGNOSIS — Z7901 Long term (current) use of anticoagulants: Secondary | ICD-10-CM | POA: Diagnosis not present

## 2021-04-16 DIAGNOSIS — R339 Retention of urine, unspecified: Secondary | ICD-10-CM | POA: Diagnosis not present

## 2021-04-16 DIAGNOSIS — E119 Type 2 diabetes mellitus without complications: Secondary | ICD-10-CM | POA: Insufficient documentation

## 2021-04-16 DIAGNOSIS — T83098A Other mechanical complication of other indwelling urethral catheter, initial encounter: Secondary | ICD-10-CM | POA: Insufficient documentation

## 2021-04-16 DIAGNOSIS — I509 Heart failure, unspecified: Secondary | ICD-10-CM | POA: Insufficient documentation

## 2021-04-16 DIAGNOSIS — Y846 Urinary catheterization as the cause of abnormal reaction of the patient, or of later complication, without mention of misadventure at the time of the procedure: Secondary | ICD-10-CM | POA: Diagnosis not present

## 2021-04-16 DIAGNOSIS — N390 Urinary tract infection, site not specified: Secondary | ICD-10-CM | POA: Diagnosis not present

## 2021-04-16 DIAGNOSIS — T83091A Other mechanical complication of indwelling urethral catheter, initial encounter: Secondary | ICD-10-CM

## 2021-04-16 LAB — URINALYSIS, COMPLETE (UACMP) WITH MICROSCOPIC
Bilirubin Urine: NEGATIVE
Glucose, UA: NEGATIVE mg/dL
Ketones, ur: NEGATIVE mg/dL
Nitrite: NEGATIVE
Protein, ur: NEGATIVE mg/dL
Specific Gravity, Urine: 1.009 (ref 1.005–1.030)
WBC, UA: >50 WBC/hpf — ABNORMAL HIGH (ref 0–5)
pH: 8 (ref 5.0–8.0)

## 2021-04-16 MED ORDER — CEPHALEXIN 500 MG PO CAPS
500.0000 mg | ORAL_CAPSULE | Freq: Once | ORAL | Status: AC
  Administered 2021-04-16: 500 mg via ORAL
  Filled 2021-04-16: qty 1

## 2021-04-16 MED ORDER — CEPHALEXIN 500 MG PO CAPS: 500.0000 mg | ORAL_CAPSULE | Freq: Three times a day (TID) | ORAL | 0 refills | Status: DC

## 2021-04-16 NOTE — Discharge Instructions (Addendum)
Take antibiotic as prescribed (Keflex 500mg  three times daily x 7 days). Urine culture is pending. We will notify you of any change in your antibiotic. Return to the ER for worsening symptoms, persistent vomiting, fever or other concerns.

## 2021-04-16 NOTE — ED Notes (Signed)
Foley drainage bag changed to leg bag.

## 2021-04-16 NOTE — ED Triage Notes (Signed)
Pt states he urinary catheter is "blocked up" pt states has not been draining in hours.

## 2021-04-16 NOTE — ED Provider Notes (Signed)
Emergency Medicine Provider Triage Evaluation Note  Tyler [name] , a 75 y.o. male  was evaluated in triage.  Pt complains of clogged Foley catheter.  Seen in the ED 04/04/2021 for same with new catheter replaced.  States back has not drained urine for 2 to 3 hours.  Minimal suprapubic discomfort.  Denies fever or vomiting..  Review of Systems  Positive: Clogged Foley catheter Negative: Vomiting  Physical Exam  BP (!) 133/92   Pulse 75   Resp 16   Ht 6\' 6"  (1.981 m)   Wt 104.3 kg   SpO2 96%   BMI 26.58 kg/m  Gen:   Awake, no distress   Resp:  Normal effort  MSK:   Moves extremities without difficulty  Other:    Medical Decision Making  Medically screening exam initiated at 1:16 AM.  Appropriate orders placed.  Jason A Siebel was informed that the remainder of the evaluation will be completed by another provider, this initial triage assessment does not replace that evaluation, and the importance of remaining in the ED until their evaluation is complete.  75 year old male presenting with clogged Foley catheter.  Patient awaiting treatment room.   73, MD 04/16/21 [phone]

## 2021-04-16 NOTE — ED Provider Notes (Signed)
[facility] Emergency Department Provider Note   ____________________________________________   Event Date/Time   First MD Initiated Contact with Patient 04/16/21 [phone]     (approximate)  I have reviewed the triage vital signs and the nursing notes.   HISTORY  Chief Complaint Urinary Retention    HPI Tyler [name] is a 75 y.o. male who presents to the ED from home with a chief complaint of clogged urinary catheter.  Patient reports no urine in drainage bag x2 hours prior to arrival.  He was seen in the ED on 04/04/2021 for similar.  Endorses minimal suprapubic discomfort.  Denies fever, cough, chest pain, shortness of breath, nausea, vomiting or dizziness.  Denies current antibiotic use.     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   Diabetes mellitus without complication (HCC)    Sepsis (HCC) 07/03/2018    Past Surgical History:  Procedure Laterality Date   CATARACT EXTRACTION W/PHACO Right 01/16/2017   Procedure: CATARACT EXTRACTION PHACO AND INTRAOCULAR LENS PLACEMENT (IOC)  Right Complicated;  Surgeon: Lockie Mola, MD;  Location: [facility];  Service: Ophthalmology;  Laterality: Right;  IVA Block Healon 5 malyugin vision blue Diabetic - oral meds   METATARSAL HEAD EXCISION Left 07/05/2018   Procedure: RESECTION FIRST METATARSAL INFECTED BONE AND SOFT TISSUE;  Surgeon: Recardo Evangelist, [name];  [name];  Service: Podiatry;  Laterality: Left;   TOE AMPUTATION Left    TONSILLECTOMY      Prior to Admission medications   Medication Sig Start  Date End Date Taking? Authorizing Provider  cephALEXin (KEFLEX) 500 MG capsule Take 1 capsule (500 mg total) by mouth 3 (three) times daily. 04/16/21  Yes Irean Hong, MD  ELIQUIS 2.5 MG TABS tablet Take 2.5 mg by mouth 2 (two) times daily. 06/13/20   [provider]  glipiZIDE (GLUCOTROL) 10 MG tablet Take 10 mg by mouth 2 (two) times daily. 06/20/18   [provider]  lisinopril (ZESTRIL) 5 MG tablet Take 1 tablet (5 mg total) by mouth daily. 07/02/20   Jene Every, MD  metFORMIN (GLUCOPHAGE) 1000 MG tablet Take 1,000 mg by mouth 2 (two) times daily. 06/17/18   [provider]  pantoprazole (PROTONIX) 40 MG tablet Take 40 mg by mouth 2 (two) times daily. 03/07/20   [provider]  pravastatin (PRAVACHOL) 20 MG tablet Take 20 mg by mouth every evening. 06/19/20   [provider]    Allergies Patient has no known allergies.  No family history on file.  Social History Social History   Tobacco Use   Smoking status: Never   Smokeless tobacco: Never  Substance Use Topics   Alcohol use: No    Review of Systems  Constitutional: No fever/chills Eyes: No visual changes. ENT: No sore throat. Cardiovascular: Denies chest pain. Respiratory: Denies shortness of breath. Gastrointestinal: No abdominal pain.  No nausea, no vomiting.  No diarrhea.  No constipation. Genitourinary: Positive for clogged Foley catheter.  Negative for dysuria. Musculoskeletal: Negative for back pain. Skin: Negative for rash. Neurological: Negative  for headaches, focal weakness or numbness.   ____________________________________________   PHYSICAL EXAM:  VITAL SIGNS: ED Triage Vitals  Enc Vitals Group     BP 04/16/21 0114 (!) 133/92     Pulse Rate 04/16/21 0114 75     Resp 04/16/21 0114 16     Temp 04/16/21 0117 98 F (36.7 C)     Temp Source 04/16/21 0114 Oral     SpO2 04/16/21 0114 96 %     Weight 04/16/21 0114 230 lb (104.3 kg)     Height 04/16/21 0114 6'  6" (1.981 m)     Head Circumference --      Peak Flow --      Pain Score --      Pain Loc --      Pain Edu? --      Excl. in GC? --    Examined after Foley replacement: Constitutional: Alert and oriented. Well appearing and in no acute distress. Eyes: Conjunctivae are normal. PERRL. EOMI. Head: Atraumatic. Nose: No congestion/rhinnorhea. Mouth/Throat: Mucous membranes are moist.   Neck: No stridor.   Cardiovascular: Normal rate, regular rhythm. Grossly normal heart sounds.  Good peripheral circulation. Respiratory: Normal respiratory effort.  No retractions. Lungs CTAB. Gastrointestinal: Soft and nontender to light or deep palpation. No distention. No abdominal bruits. No CVA tenderness. Genitourinary: Uncircumcised male.  White clumps in urine noted with some purulence extruded from urethral meatus. Musculoskeletal: No lower extremity tenderness nor edema.  No joint effusions. Neurologic:  Normal speech and language. No gross focal neurologic deficits are appreciated. No gait instability. Skin:  Skin is warm, dry and intact. No rash noted. Psychiatric: Mood and affect are normal. Speech and behavior are normal.  ____________________________________________   LABS (all labs ordered are listed, but only abnormal results are displayed)  Labs Reviewed  URINE CULTURE  URINALYSIS, COMPLETE (UACMP) WITH MICROSCOPIC   ____________________________________________  EKG  None ____________________________________________  RADIOLOGY I, Kitzia Camus J, personally viewed and evaluated these images (plain radiographs) as part of my medical decision making, as well as reviewing the written report by the radiologist.  ED MD interpretation: None  Official radiology report(s): No results found.  ____________________________________________   PROCEDURES  Procedure(s) performed (including Critical Care):  Procedures   ____________________________________________   INITIAL  IMPRESSION / ASSESSMENT AND PLAN / ED COURSE  As part of my medical decision making, I reviewed the following data within the electronic MEDICAL RECORD NUMBER Nursing notes reviewed and incorporated, Old chart reviewed, and Notes from prior ED visits     75 year old male presenting with clogged Foley catheter.  Differential diagnosis includes but is not limited to clogged secondary to hematuria, retained tissue, infection, etc.  Foley replaced with return of 800 cc urine with WBC clumps.  Patient with immediate relief.  Will place on Keflex, obtain urine culture.  Patient has upcoming appointment with his urologist at [facility].  Strict return precautions given.  Patient verbalizes understanding agrees with plan of care.      ____________________________________________   FINAL CLINICAL IMPRESSION(S) / ED DIAGNOSES  Final diagnoses:  Obstruction of Foley catheter, initial encounter [facility])  Lower urinary tract infectious disease  Urinary retention     ED Discharge Orders          Ordered    cephALEXin (KEFLEX) 500 MG capsule  3 times daily        04/16/21 0555             Note:  This document was  prepared using Conservation officer, historic buildings and may include unintentional dictation errors.    Irean Hong, MD 04/16/21 [phone]

## 2021-04-18 LAB — URINE CULTURE: Culture: >=100000 — AB

## 2021-04-21 ENCOUNTER — Encounter: Payer: Self-pay | Admitting: Emergency Medicine

## 2021-04-21 ENCOUNTER — Emergency Department
Admission: EM | Admit: 2021-04-21 | Discharge: 2021-04-21 | Disposition: A | Discharge status: Home / Self Care | Payer: BC Managed Care – PPO | Attending: Emergency Medicine | Admitting: Emergency Medicine

## 2021-04-21 ENCOUNTER — Other Ambulatory Visit: Payer: Self-pay

## 2021-04-21 DIAGNOSIS — R339 Retention of urine, unspecified: Secondary | ICD-10-CM | POA: Diagnosis present

## 2021-04-21 DIAGNOSIS — Z79899 Other long term (current) drug therapy: Secondary | ICD-10-CM | POA: Insufficient documentation

## 2021-04-21 DIAGNOSIS — I509 Heart failure, unspecified: Secondary | ICD-10-CM | POA: Insufficient documentation

## 2021-04-21 DIAGNOSIS — R110 Nausea: Secondary | ICD-10-CM | POA: Insufficient documentation

## 2021-04-21 DIAGNOSIS — Z7901 Long term (current) use of anticoagulants: Secondary | ICD-10-CM | POA: Insufficient documentation

## 2021-04-21 DIAGNOSIS — R1030 Lower abdominal pain, unspecified: Secondary | ICD-10-CM | POA: Diagnosis not present

## 2021-04-21 DIAGNOSIS — E119 Type 2 diabetes mellitus without complications: Secondary | ICD-10-CM | POA: Insufficient documentation

## 2021-04-21 DIAGNOSIS — Z7984 Long term (current) use of oral hypoglycemic drugs: Secondary | ICD-10-CM | POA: Insufficient documentation

## 2021-04-21 DIAGNOSIS — I4891 Unspecified atrial fibrillation: Secondary | ICD-10-CM | POA: Insufficient documentation

## 2021-04-21 LAB — URINALYSIS, COMPLETE (UACMP) WITH MICROSCOPIC
Bacteria, UA: NONE SEEN
Bilirubin Urine: NEGATIVE
Glucose, UA: NEGATIVE mg/dL
Hgb urine dipstick: NEGATIVE
Ketones, ur: NEGATIVE mg/dL
Nitrite: NEGATIVE
Protein, ur: NEGATIVE mg/dL
Specific Gravity, Urine: 1.011 (ref 1.005–1.030)
Squamous Epithelial / HPF: NONE SEEN (ref 0–5)
pH: 7 (ref 5.0–8.0)

## 2021-04-21 LAB — BASIC METABOLIC PANEL
Anion gap: 8 (ref 5–15)
BUN: 22 mg/dL (ref 8–23)
CO2: 26 mmol/L (ref 22–32)
Calcium: 9.9 mg/dL (ref 8.9–10.3)
Chloride: 104 mmol/L (ref 98–111)
Creatinine, Ser: 1.01 mg/dL (ref 0.61–1.24)
GFR, Estimated: >60 mL/min (ref >60)
Glucose, Bld: 152 mg/dL — ABNORMAL HIGH (ref 70–99)
Potassium: 5.2 mmol/L — ABNORMAL HIGH (ref 3.5–5.1)
Sodium: 138 mmol/L (ref 135–145)

## 2021-04-21 LAB — CBC WITH DIFFERENTIAL/PLATELET
Abs Immature Granulocytes: 0.09 10*3/uL — ABNORMAL HIGH (ref 0.00–0.07)
Basophils Absolute: 0.1 10*3/uL (ref 0.0–0.1)
Basophils Relative: 1 %
Eosinophils Absolute: 0.3 10*3/uL (ref 0.0–0.5)
Eosinophils Relative: 3 %
HCT: 38.2 % — ABNORMAL LOW (ref 39.0–52.0)
Hemoglobin: 13.1 g/dL (ref 13.0–17.0)
Immature Granulocytes: 1 %
Lymphocytes Relative: 16 %
Lymphs Abs: 1.6 10*3/uL (ref 0.7–4.0)
MCH: 31.6 pg (ref 26.0–34.0)
MCHC: 34.3 g/dL (ref 30.0–36.0)
MCV: 92 fL (ref 80.0–100.0)
Monocytes Absolute: 0.8 10*3/uL (ref 0.1–1.0)
Monocytes Relative: 8 %
Neutro Abs: 7.2 10*3/uL (ref 1.7–7.7)
Neutrophils Relative %: 71 %
Platelets: 161 10*3/uL (ref 150–400)
RBC: 4.15 MIL/uL — ABNORMAL LOW (ref 4.22–5.81)
RDW: 13.9 % (ref 11.5–15.5)
WBC: 10.2 10*3/uL (ref 4.0–10.5)
nRBC: 0 % (ref 0.0–0.2)

## 2021-04-21 MED ORDER — SULFAMETHOXAZOLE-TRIMETHOPRIM 800-160 MG PO TABS: 1.0000 | ORAL_TABLET | Freq: Two times a day (BID) | ORAL | 0 refills | Status: AC

## 2021-04-21 NOTE — Discharge Instructions (Addendum)
We are going to switch you from the [facility] antibiotic to BACTRIM  This is twice a day, for 7 days  Start this antibiotic today  Drink plenty of fluids

## 2021-04-21 NOTE — ED Provider Notes (Signed)
[facility] Emergency Department Provider Note  ____________________________________________   Event Date/Time   First MD Initiated Contact with Patient 04/21/21 [phone]     (approximate)  I have reviewed the triage vital signs and the nursing notes.   HISTORY  Chief Complaint Bladder retention and Catheter blockage    HPI Tyler [name] is a 75 y.o. male with history of chronic urinary retention with indwelling Foley here with urinary retention.  The patient states he has had some slight increase in sediment over the last several days.  He just had his Foley catheter replaced about 5 days ago.  He states that earlier this evening/early morning, he began to develop sensation like he cannot empty his bladder.  He had associated aching, throbbing, suprapubic pain and fullness.  He subsequently presents for evaluation.  Has had some mild nausea.  He had a Foley catheter placed in triage and feels much better.  He feels like it is draining appropriately.  He denies any known fevers or chills.  No rigors.  No other complaints.  No trauma.  No penile pain.    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   Diabetes mellitus without complication (HCC)    Sepsis (HCC) 07/03/2018    Past Surgical History:  Procedure Laterality Date   CATARACT EXTRACTION W/PHACO Right 01/16/2017   Procedure: CATARACT EXTRACTION PHACO AND INTRAOCULAR LENS PLACEMENT (IOC)  Right Complicated;  Surgeon: Lockie Mola, MD;  Location: [facility];  Service: Ophthalmology;  Laterality: Right;  IVA  Block Healon 5 malyugin vision blue Diabetic - oral meds   METATARSAL HEAD EXCISION Left 07/05/2018   Procedure: RESECTION FIRST METATARSAL INFECTED BONE AND SOFT TISSUE;  Surgeon: Recardo Evangelist, [name];  [name];  Service: Podiatry;  Laterality: Left;   TOE AMPUTATION Left    TONSILLECTOMY      Prior to Admission medications   Medication Sig Start Date End Date Taking? Authorizing Provider  sulfamethoxazole-trimethoprim (BACTRIM DS) 800-160 MG tablet Take 1 tablet by mouth 2 (two) times daily for 7 days. 04/21/21 04/28/21 Yes Shaune Pollack, MD  ELIQUIS 2.5 MG TABS tablet Take 2.5 mg by mouth 2 (two) times daily. 06/13/20   [provider]  glipiZIDE (GLUCOTROL) 10 MG tablet Take 10 mg by mouth 2 (two) times daily. 06/20/18   [provider]  lisinopril (ZESTRIL) 5 MG tablet Take 1 tablet (5 mg total) by mouth daily. 07/02/20   Jene Every, MD  metFORMIN (GLUCOPHAGE) 1000 MG tablet Take 1,000 mg by mouth 2 (two) times daily. 06/17/18   [provider]  pantoprazole (PROTONIX) 40 MG tablet Take 40 mg by mouth 2 (two) times daily. 03/07/20   [provider]  pravastatin (PRAVACHOL) 20 MG tablet Take 20 mg by mouth every evening. 06/19/20   [provider]    Allergies Patient has no known allergies.  No family history on file.  Social History Social History   Tobacco Use   Smoking status: Never   Smokeless tobacco: Never  Substance Use Topics   Alcohol  use: No    Review of Systems  Review of Systems  Constitutional:  Positive for fatigue. Negative for chills and fever.  HENT:  Negative for sore throat.   Respiratory:  Negative for shortness of breath.   Cardiovascular:  Negative for chest pain.  Gastrointestinal:  Positive for abdominal pain.  Genitourinary:  Positive for decreased urine volume. Negative for flank pain.  Musculoskeletal:  Negative for neck pain.  Skin:  Negative for rash and wound.  Allergic/Immunologic:  Negative for immunocompromised state.  Neurological:  Negative for weakness and numbness.  Hematological:  Does not bruise/bleed easily.  All other systems reviewed and are negative.   ____________________________________________  PHYSICAL EXAM:      VITAL SIGNS: ED Triage Vitals  Enc Vitals Group     BP 04/21/21 0522 (!) 193/90     Pulse Rate 04/21/21 0522 91     Resp 04/21/21 0522 18     Temp 04/21/21 0522 97.9 F (36.6 C)     Temp Source 04/21/21 0522 Oral     SpO2 04/21/21 0522 97 %     Weight 04/21/21 0523 230 lb (104.3 kg)     Height --      Head Circumference --      Peak Flow --      Pain Score 04/21/21 0904 0     Pain Loc --      Pain Edu? --      Excl. in GC? --      Physical Exam Vitals and nursing note reviewed.  Constitutional:      General: He is not in acute distress.    Appearance: He is well-developed.  HENT:     Head: Normocephalic and atraumatic.  Eyes:     Conjunctiva/sclera: Conjunctivae normal.  Cardiovascular:     Rate and Rhythm: Normal rate and regular rhythm.     Heart sounds: Normal heart sounds.  Pulmonary:     Effort: Pulmonary effort is normal. No respiratory distress.     Breath sounds: No wheezing.  Abdominal:     General: Abdomen is flat. There is no distension.  Genitourinary:    Comments: Foley catheter in place, draining clear yellow urine. Musculoskeletal:     Cervical back: Neck supple.  Skin:    General: Skin is warm.     Capillary Refill: Capillary refill takes less than 2 seconds.     Findings: No rash.  Neurological:     Mental Status: He is alert and oriented to person, place, and time.     Motor: No abnormal muscle tone.      ____________________________________________   LABS (all labs ordered are listed, but only abnormal results are displayed)  Labs Reviewed  CBC WITH DIFFERENTIAL/PLATELET - Abnormal; Notable for the following components:      Result Value   RBC 4.15 (*)    HCT 38.2 (*)    Abs  Immature Granulocytes 0.09 (*)    All other components within normal limits  BASIC METABOLIC PANEL - Abnormal; Notable for the following components:   Potassium 5.2 (*)    Glucose, Bld 152 (*)    All other components within normal limits  URINALYSIS, COMPLETE (UACMP) WITH MICROSCOPIC - Abnormal; Notable for the following components:   Color, Urine STRAW (*)    APPearance CLEAR (*)    Leukocytes,Ua MODERATE (*)    All other components within normal limits    ____________________________________________  EKG:  ________________________________________  RADIOLOGY All imaging, including plain films,  CT scans, and ultrasounds, independently reviewed by me, and interpretations confirmed via formal radiology reads.  ED MD interpretation:     Official radiology report(s): No results found.  ____________________________________________  PROCEDURES   Procedure(s) performed (including Critical Care):  Procedures  ____________________________________________  INITIAL IMPRESSION / MDM / ASSESSMENT AND PLAN / ED COURSE  As part of my medical decision making, I reviewed the following data within the electronic MEDICAL RECORD NUMBER Nursing notes reviewed and incorporated, Old chart reviewed, Notes from prior ED visits, and [place] Controlled Substance Database       *Tyler [name] was evaluated in Emergency Department on 04/21/2021 for the symptoms described in the history of present illness. He was evaluated in the context of the global COVID-19 pandemic, which necessitated consideration that the patient might be at risk for infection with the SARS-CoV-2 virus that causes COVID-19. Institutional protocols and algorithms that pertain to the evaluation of patients at risk for COVID-19 are in a state of rapid change based on information released by regulatory bodies including the CDC and federal and state organizations. These policies and algorithms were followed during the patient's care in the ED.   Some ED evaluations and interventions may be delayed as a result of limited staffing during the pandemic.*     Medical Decision Making: 75 year old male here with recurrent urinary retention.  Foley catheter replaced with drainage of significant amount of urine here.  He is otherwise afebrile and hemodynamically stable.  Did review his recent cultures, which are positive for 2 bacteria.  I suspect he had blockage of his catheter due to sediment from UTI.  Will plan to treat with antibiotics.  I reviewed culture results, patient should be sensitive to cephalosporins.  Otherwise, he is hemodynamically stable.  I have sent screening labs to evaluate for renal function and leukocytosis.  CBC without significant leukocytosis.  Renal function is at baseline.  Patient is otherwise hemodynamically stable.  I reviewed his culture results.  Will switch him to Bactrim, based on sensitivities.  Will extend this to 7 days given catheter use.  Return precautions given.  Encouraged hydration. ____________________________________________  FINAL CLINICAL IMPRESSION(S) / ED DIAGNOSES  Final diagnoses:  None     MEDICATIONS GIVEN DURING THIS VISIT:  Medications - No data to display   ED Discharge Orders          Ordered    sulfamethoxazole-trimethoprim (BACTRIM DS) 800-160 MG tablet  2 times daily        04/21/21 1001             Note:  This document was prepared using Dragon voice recognition software and may include unintentional dictation errors.   Shaune Pollack, MD 04/21/21 1009

## 2021-04-21 NOTE — ED Triage Notes (Signed)
Pt in with c/o blocked urinary catheter, recently changed out on 7/24. Denies any fevers, says no output for a few hrs

## 2021-04-21 NOTE — ED Notes (Signed)
Exchange foley per Dr. York Cerise

## 2021-04-21 NOTE — ED Notes (Signed)
Foley intact on arrival , clear yellow urine in collection bag. New catheter and collection bag changed out on this visit

## 2021-08-01 DIAGNOSIS — I10 Essential (primary) hypertension: Secondary | ICD-10-CM | POA: Diagnosis not present

## 2021-08-01 DIAGNOSIS — N138 Other obstructive and reflux uropathy: Secondary | ICD-10-CM | POA: Diagnosis not present

## 2021-08-01 DIAGNOSIS — R339 Retention of urine, unspecified: Secondary | ICD-10-CM | POA: Diagnosis not present

## 2021-08-01 DIAGNOSIS — N401 Enlarged prostate with lower urinary tract symptoms: Secondary | ICD-10-CM | POA: Diagnosis not present

## 2021-09-15 ENCOUNTER — Encounter: Payer: Self-pay | Admitting: Emergency Medicine

## 2021-09-15 ENCOUNTER — Emergency Department
Admission: EM | Admit: 2021-09-15 | Discharge: 2021-09-16 | Disposition: A | Discharge status: Home / Self Care | Payer: BC Managed Care – PPO | Attending: Emergency Medicine | Admitting: Emergency Medicine

## 2021-09-15 ENCOUNTER — Emergency Department: Payer: BC Managed Care – PPO

## 2021-09-15 ENCOUNTER — Other Ambulatory Visit: Payer: Self-pay

## 2021-09-15 DIAGNOSIS — J208 Acute bronchitis due to other specified organisms: Secondary | ICD-10-CM | POA: Diagnosis not present

## 2021-09-15 DIAGNOSIS — Z7901 Long term (current) use of anticoagulants: Secondary | ICD-10-CM | POA: Insufficient documentation

## 2021-09-15 DIAGNOSIS — U071 COVID-19: Secondary | ICD-10-CM

## 2021-09-15 DIAGNOSIS — Z79899 Other long term (current) drug therapy: Secondary | ICD-10-CM | POA: Insufficient documentation

## 2021-09-15 DIAGNOSIS — I509 Heart failure, unspecified: Secondary | ICD-10-CM | POA: Diagnosis not present

## 2021-09-15 DIAGNOSIS — I517 Cardiomegaly: Secondary | ICD-10-CM | POA: Diagnosis not present

## 2021-09-15 DIAGNOSIS — R0602 Shortness of breath: Secondary | ICD-10-CM | POA: Diagnosis not present

## 2021-09-15 DIAGNOSIS — I4891 Unspecified atrial fibrillation: Secondary | ICD-10-CM | POA: Insufficient documentation

## 2021-09-15 DIAGNOSIS — Z7984 Long term (current) use of oral hypoglycemic drugs: Secondary | ICD-10-CM | POA: Insufficient documentation

## 2021-09-15 DIAGNOSIS — B9789 Other viral agents as the cause of diseases classified elsewhere: Secondary | ICD-10-CM | POA: Diagnosis not present

## 2021-09-15 DIAGNOSIS — R918 Other nonspecific abnormal finding of lung field: Secondary | ICD-10-CM | POA: Diagnosis not present

## 2021-09-15 DIAGNOSIS — I10 Essential (primary) hypertension: Secondary | ICD-10-CM | POA: Diagnosis not present

## 2021-09-15 DIAGNOSIS — J90 Pleural effusion, not elsewhere classified: Secondary | ICD-10-CM | POA: Diagnosis not present

## 2021-09-15 DIAGNOSIS — J9811 Atelectasis: Secondary | ICD-10-CM | POA: Diagnosis not present

## 2021-09-15 DIAGNOSIS — Z20822 Contact with and (suspected) exposure to covid-19: Secondary | ICD-10-CM | POA: Diagnosis not present

## 2021-09-15 DIAGNOSIS — R9431 Abnormal electrocardiogram [ECG] [EKG]: Secondary | ICD-10-CM | POA: Diagnosis not present

## 2021-09-15 DIAGNOSIS — E119 Type 2 diabetes mellitus without complications: Secondary | ICD-10-CM | POA: Insufficient documentation

## 2021-09-15 DIAGNOSIS — R059 Cough, unspecified: Secondary | ICD-10-CM | POA: Diagnosis not present

## 2021-09-15 LAB — COMPREHENSIVE METABOLIC PANEL
ALT: 33 U/L (ref 0–44)
AST: 28 U/L (ref 15–41)
Albumin: 3.7 g/dL (ref 3.5–5.0)
Alkaline Phosphatase: 65 U/L (ref 38–126)
Anion gap: 5 (ref 5–15)
BUN: 25 mg/dL — ABNORMAL HIGH (ref 8–23)
CO2: 25 mmol/L (ref 22–32)
Calcium: 9.3 mg/dL (ref 8.9–10.3)
Chloride: 105 mmol/L (ref 98–111)
Creatinine, Ser: 1.24 mg/dL (ref 0.61–1.24)
GFR, Estimated: 60 mL/min — ABNORMAL LOW (ref >60)
Glucose, Bld: 183 mg/dL — ABNORMAL HIGH (ref 70–99)
Potassium: 4.9 mmol/L (ref 3.5–5.1)
Sodium: 135 mmol/L (ref 135–145)
Total Bilirubin: 0.7 mg/dL (ref 0.3–1.2)
Total Protein: 7.6 g/dL (ref 6.5–8.1)

## 2021-09-15 LAB — TROPONIN I (HIGH SENSITIVITY)
Troponin I (High Sensitivity): 28 ng/L — ABNORMAL HIGH (ref <18)
Troponin I (High Sensitivity): 45 ng/L — ABNORMAL HIGH (ref <18)

## 2021-09-15 LAB — CBC WITH DIFFERENTIAL/PLATELET
Abs Immature Granulocytes: 0.07 10*3/uL (ref 0.00–0.07)
Basophils Absolute: 0 10*3/uL (ref 0.0–0.1)
Basophils Relative: 0 %
Eosinophils Absolute: 0.1 10*3/uL (ref 0.0–0.5)
Eosinophils Relative: 1 %
HCT: 39 % (ref 39.0–52.0)
Hemoglobin: 12.9 g/dL — ABNORMAL LOW (ref 13.0–17.0)
Immature Granulocytes: 1 %
Lymphocytes Relative: 13 %
Lymphs Abs: 1.3 10*3/uL (ref 0.7–4.0)
MCH: 31.5 pg (ref 26.0–34.0)
MCHC: 33.1 g/dL (ref 30.0–36.0)
MCV: 95.4 fL (ref 80.0–100.0)
Monocytes Absolute: 0.6 10*3/uL (ref 0.1–1.0)
Monocytes Relative: 6 %
Neutro Abs: 8 10*3/uL — ABNORMAL HIGH (ref 1.7–7.7)
Neutrophils Relative %: 79 %
Platelets: 226 10*3/uL (ref 150–400)
RBC: 4.09 MIL/uL — ABNORMAL LOW (ref 4.22–5.81)
RDW: 13 % (ref 11.5–15.5)
WBC: 10.1 10*3/uL (ref 4.0–10.5)
nRBC: 0 % (ref 0.0–0.2)

## 2021-09-15 LAB — RESP PANEL BY RT-PCR (FLU A&B, COVID) ARPGX2
Influenza A by PCR: NEGATIVE
Influenza B by PCR: NEGATIVE
SARS Coronavirus 2 by RT PCR: POSITIVE — AB

## 2021-09-15 LAB — BRAIN NATRIURETIC PEPTIDE: B Natriuretic Peptide: 813.4 pg/mL — ABNORMAL HIGH (ref 0.0–100.0)

## 2021-09-15 MED ORDER — PREDNISONE 10 MG (21) PO TBPK: ORAL_TABLET | ORAL | 0 refills | Status: DC

## 2021-09-15 MED ORDER — ONDANSETRON 4 MG PO TBDP: 4.0000 mg | ORAL_TABLET | Freq: Three times a day (TID) | ORAL | 0 refills | Status: DC | PRN

## 2021-09-15 NOTE — ED Triage Notes (Signed)
Pt comes into the ED via POV c/o SHOB.  Pt states it has been going on for about a week because people at work got sick and then he did too, but now his symptoms are lingering and getting worse.  [facility] sent him over for fluid on his lungs.  Pt currently able to speak in full sentences and has even and unlabored respirations.

## 2021-09-15 NOTE — ED Provider Notes (Signed)
Emergency Medicine Provider Triage Evaluation Note  Tyler [name] , a 75 y.o. male  was evaluated in triage.  Pt complains of shortness of breath.  States he was seen at urgent care and told his lungs are full fluid.  Had cough and congestion for the past week.  No fever or chills today.  Has had fever last few days.  Review of Systems  Positive: Shortness of breath, difficulty breathing Negative: Nausea/vomiting/diarrhea  Physical Exam  BP (!) 142/109 (BP Location: Left Arm)    Pulse (!) 114    Temp 98.1 F (36.7 C) (Oral)    Resp (!) 22    SpO2 96%  Gen:   Awake, no distress   Resp:  Normal effort  MSK:   Moves extremities without difficulty  Other:    Medical Decision Making  Medically screening exam initiated at 2:28 PM.  Appropriate orders placed.  Tyler [name] was informed that the remainder of the evaluation will be completed by another provider, this initial triage assessment does not replace that evaluation, and the importance of remaining in the ED until their evaluation is complete.     Faythe Ghee, PA-C 09/15/21 1429    Concha Se, MD 09/15/21 [phone]

## 2021-09-15 NOTE — ED Provider Notes (Signed)
[facility] Emergency Department Provider Note  ____________________________________________  Time seen: Approximately 11:47 PM  I have reviewed the triage vital signs and the nursing notes.   HISTORY  Chief Complaint Shortness of Breath    HPI Tyler [name] is a 75 y.o. male with a history of atrial fibrillation on Eliquis, diabetes who comes ED complaining of shortness of breath, gradual onset, worsening for the past week, associated with loss of appetite malaise and fatigue.  He notes that prior to starting to feel ill, other coworkers were sick.  He went to urgent care today and they were concerned that he might have fluid in his lungs and sent him to the ED.  Symptoms are constant.  No exertional symptoms, no chest pain.  No vomiting or diarrhea.    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   Diabetes mellitus without complication (HCC)    Sepsis (HCC) 07/03/2018     Past Surgical History:  Procedure Laterality Date   CATARACT EXTRACTION W/PHACO Right 01/16/2017   Procedure: CATARACT EXTRACTION PHACO AND INTRAOCULAR LENS PLACEMENT (IOC)  Right Complicated;  Surgeon: Lockie Mola, MD;  Location: [facility];  Service: Ophthalmology;  Laterality: Right;  IVA Block Healon 5 malyugin vision blue Diabetic - oral meds   METATARSAL HEAD EXCISION Left 07/05/2018   Procedure: RESECTION FIRST METATARSAL INFECTED BONE AND SOFT TISSUE;  Surgeon: Recardo Evangelist, [name];  [name];  Service: Podiatry;  Laterality: Left;   TOE AMPUTATION Left     TONSILLECTOMY       Prior to Admission medications   Medication Sig Start Date End Date Taking? Authorizing Provider  ondansetron (ZOFRAN-ODT) 4 MG disintegrating tablet Take 1 tablet (4 mg total) by mouth every 8 (eight) hours as needed for nausea or vomiting. 09/15/21  Yes Sharman Cheek, MD  predniSONE (STERAPRED UNI-PAK 21 TAB) 10 MG (21) TBPK tablet 6 tablets on day 1, then 5 tablets on day 2, then 4 tablets on day 3, then 3 tablets on day 4, then 2 tablets on day 5, then 1 tablet on day 6. 09/15/21  Yes Sharman Cheek, MD  ELIQUIS 2.5 MG TABS tablet Take 2.5 mg by mouth 2 (two) times daily. 06/13/20   [provider]  glipiZIDE (GLUCOTROL) 10 MG tablet Take 10 mg by mouth 2 (two) times daily. 06/20/18   [provider]  lisinopril (ZESTRIL) 5 MG tablet Take 1 tablet (5 mg total) by mouth daily. 07/02/20   Jene Every, MD  metFORMIN (GLUCOPHAGE) 1000 MG tablet Take 1,000 mg by mouth 2 (two) times daily. 06/17/18   [provider]  pantoprazole (PROTONIX) 40 MG tablet Take 40 mg by mouth 2 (two) times daily. 03/07/20   [provider]  pravastatin (PRAVACHOL) 20 MG tablet Take 20 mg by mouth every evening. 06/19/20   [provider]     Allergies Patient has no known allergies.   History reviewed. No pertinent family history.  Social History Social History   Tobacco Use   Smoking status: Never   Smokeless  tobacco: Never  Substance Use Topics   Alcohol use: No    Review of Systems  Constitutional:   No fever or chills.  ENT:   No sore throat. No rhinorrhea. Cardiovascular:   No chest pain or syncope. Respiratory:   Positive shortness of breath and nonproductive cough. Gastrointestinal:   Negative for abdominal pain, vomiting and diarrhea.  Musculoskeletal:   Negative for focal pain or swelling All other systems reviewed and are negative except as documented above in ROS and  HPI.  ____________________________________________   PHYSICAL EXAM:  VITAL SIGNS: ED Triage Vitals  Enc Vitals Group     BP 09/15/21 1425 (!) 142/109     Pulse Rate 09/15/21 1425 (!) 114     Resp 09/15/21 1425 (!) 22     Temp 09/15/21 1425 98.1 F (36.7 C)     Temp Source 09/15/21 1425 Oral     SpO2 09/15/21 1425 96 %     Weight 09/15/21 1435 229 lb 15 oz (104.3 kg)     Height 09/15/21 1435 6\' 6"  (1.981 m)     Head Circumference --      Peak Flow --      Pain Score 09/15/21 1435 0     Pain Loc --      Pain Edu? --      Excl. in GC? --     Vital signs reviewed, nursing assessments reviewed.   Constitutional:   Alert and oriented. Non-toxic appearance. Eyes:   Conjunctivae are normal. EOMI. PERRL. ENT      Head:   Normocephalic and atraumatic.      Nose:   Wearing a mask.      Mouth/Throat:   Wearing a mask.      Neck:   No meningismus. Full ROM. Hematological/Lymphatic/Immunilogical:   No cervical lymphadenopathy. Cardiovascular:   RRR, heart rate 90. Symmetric bilateral radial and DP pulses.  No murmurs. Cap refill less than 2 seconds. Respiratory:   Normal respiratory effort without tachypnea/retractions. Breath sounds are clear and equal bilaterally.  Inducible wheezing with cough.  Normal expiratory phase. Gastrointestinal:   Soft and nontender. Non distended. There is no CVA tenderness.  No rebound, rigidity, or guarding. Genitourinary:   deferred Musculoskeletal:   Normal range of motion in all extremities. No joint effusions.  No lower extremity tenderness.  1+ pitting edema bilaterally, chronic per patient. Neurologic:   Normal speech and language.  Motor grossly intact. No acute focal neurologic deficits are appreciated.  Skin:    Skin is warm, dry and intact. No rash noted.  No petechiae, purpura, or bullae.  ____________________________________________    LABS (pertinent positives/negatives) (all labs ordered are listed, but only abnormal results are  displayed) Labs Reviewed  RESP PANEL BY RT-PCR (FLU A&B, COVID) ARPGX2 - Abnormal; Notable for the following components:      Result Value   SARS Coronavirus 2 by RT PCR POSITIVE (*)    All other components within normal limits  BRAIN NATRIURETIC PEPTIDE - Abnormal; Notable for the following components:   B Natriuretic Peptide 813.4 (*)    All other components within normal limits  COMPREHENSIVE METABOLIC PANEL - Abnormal; Notable for the following components:   Glucose, Bld 183 (*)    BUN 25 (*)    GFR, Estimated 60 (*)    All other components within normal limits  CBC WITH DIFFERENTIAL/PLATELET - Abnormal; Notable for the following components:   RBC 4.09 (*)    Hemoglobin 12.9 (*)  Neutro Abs 8.0 (*)    All other components within normal limits  TROPONIN I (HIGH SENSITIVITY) - Abnormal; Notable for the following components:   Troponin I (High Sensitivity) 28 (*)    All other components within normal limits  TROPONIN I (HIGH SENSITIVITY) - Abnormal; Notable for the following components:   Troponin I (High Sensitivity) 45 (*)    All other components within normal limits   ____________________________________________   EKG  Interpreted by me Ventricular paced rhythm, rate of 106.  Left axis, left bundle branch block, no acute ischemic changes.  ____________________________________________    RADIOLOGY  DG Chest 2 View  Result Date: 09/15/2021 CLINICAL DATA:  Short of breath EXAM: CHEST - 2 VIEW COMPARISON:  06/28/2020 FINDINGS: Cardiac enlargement. Negative for heart failure or edema. Small left pleural effusion. Mild bibasilar atelectasis Interval placement dual lead pacemaker with leads in the right atrium and right ventricle. IMPRESSION: Mild bibasilar atelectasis, improved from the prior study. Small left effusion. Negative for edema. Satisfactory pacemaker positioning. Electronically Signed   By: Marlan Palau M.D.   On: 09/15/2021 14:59     ____________________________________________   PROCEDURES Procedures  ____________________________________________  DIFFERENTIAL DIAGNOSIS   Pneumonia, pleural effusion, pulmonary edema, viral illness, non-STEMI, PE  CLINICAL IMPRESSION / ASSESSMENT AND PLAN / ED COURSE  Medications ordered in the ED: Medications - No data to display  Pertinent labs & imaging results that were available during my care of the patient were reviewed by me and considered in my medical decision making (see chart for details).  Tyler [name] was evaluated in Emergency Department on 09/15/2021 for the symptoms described in the history of present illness. He was evaluated in the context of the global COVID-19 pandemic, which necessitated consideration that the patient might be at risk for infection with the SARS-CoV-2 virus that causes COVID-19. Institutional protocols and algorithms that pertain to the evaluation of patients at risk for COVID-19 are in a state of rapid change based on information released by regulatory bodies including the CDC and federal and state organizations. These policies and algorithms were followed during the patient's care in the ED.   Patient presents with shortness of breath, loss of appetite, fatigue, suggestive of a viral illness.  He is nontoxic and not septic.  2 troponins are both low, labs are reassuring, viral swab is positive for COVID.  Chest x-ray unremarkable.  This is consistent with a COVID illness, not hypoxic or in any respiratory distress and stable for discharge.  Patient agrees with outpatient management.     ____________________________________________   FINAL CLINICAL IMPRESSION(S) / ED DIAGNOSES    Final diagnoses:  COVID-19 virus infection  Viral bronchitis     ED Discharge Orders          Ordered    predniSONE (STERAPRED UNI-PAK 21 TAB) 10 MG (21) TBPK tablet        09/15/21 2347    ondansetron (ZOFRAN-ODT) 4 MG disintegrating tablet  Every 8  hours PRN        09/15/21 2347            Portions of this note were generated with dragon dictation software. Dictation errors may occur despite best attempts at proofreading.    Sharman Cheek, MD 09/15/21 [phone]

## 2021-09-15 NOTE — ED Notes (Signed)
Pt walking around lobby with no mask on. Pt informed multiple times that mask needs to be worn. Pt provided with po fluids and snack.

## 2021-09-21 DIAGNOSIS — Z09 Encounter for follow-up examination after completed treatment for conditions other than malignant neoplasm: Secondary | ICD-10-CM | POA: Diagnosis not present

## 2021-09-21 DIAGNOSIS — Z8616 Personal history of COVID-19: Secondary | ICD-10-CM | POA: Diagnosis not present

## 2021-10-02 DIAGNOSIS — Z89412 Acquired absence of left great toe: Secondary | ICD-10-CM | POA: Diagnosis not present

## 2021-10-02 DIAGNOSIS — I469 Cardiac arrest, cause unspecified: Secondary | ICD-10-CM | POA: Diagnosis not present

## 2021-10-02 DIAGNOSIS — Z89419 Acquired absence of unspecified great toe: Secondary | ICD-10-CM | POA: Insufficient documentation

## 2021-10-02 DIAGNOSIS — E119 Type 2 diabetes mellitus without complications: Secondary | ICD-10-CM | POA: Diagnosis not present

## 2021-10-02 DIAGNOSIS — I10 Essential (primary) hypertension: Secondary | ICD-10-CM | POA: Diagnosis not present

## 2021-10-02 DIAGNOSIS — I5032 Chronic diastolic (congestive) heart failure: Secondary | ICD-10-CM | POA: Diagnosis not present

## 2021-10-02 DIAGNOSIS — E782 Mixed hyperlipidemia: Secondary | ICD-10-CM | POA: Diagnosis not present

## 2021-11-02 DIAGNOSIS — I10 Essential (primary) hypertension: Secondary | ICD-10-CM | POA: Diagnosis not present

## 2021-11-02 DIAGNOSIS — I4891 Unspecified atrial fibrillation: Secondary | ICD-10-CM | POA: Diagnosis not present

## 2021-11-02 DIAGNOSIS — I4819 Other persistent atrial fibrillation: Secondary | ICD-10-CM | POA: Diagnosis not present

## 2021-11-02 DIAGNOSIS — I5032 Chronic diastolic (congestive) heart failure: Secondary | ICD-10-CM | POA: Diagnosis not present

## 2021-11-02 DIAGNOSIS — I4892 Unspecified atrial flutter: Secondary | ICD-10-CM | POA: Diagnosis not present

## 2021-11-02 DIAGNOSIS — R001 Bradycardia, unspecified: Secondary | ICD-10-CM | POA: Diagnosis not present

## 2021-12-04 DIAGNOSIS — I4819 Other persistent atrial fibrillation: Secondary | ICD-10-CM | POA: Diagnosis not present

## 2021-12-04 DIAGNOSIS — I340 Nonrheumatic mitral (valve) insufficiency: Secondary | ICD-10-CM | POA: Diagnosis not present

## 2021-12-04 DIAGNOSIS — I5032 Chronic diastolic (congestive) heart failure: Secondary | ICD-10-CM | POA: Diagnosis not present

## 2021-12-04 DIAGNOSIS — R001 Bradycardia, unspecified: Secondary | ICD-10-CM | POA: Diagnosis not present

## 2021-12-09 ENCOUNTER — Emergency Department
Admission: EM | Admit: 2021-12-09 | Discharge: 2021-12-09 | Disposition: A | Discharge status: Home / Self Care | Payer: BC Managed Care – PPO | Attending: Emergency Medicine | Admitting: Emergency Medicine

## 2021-12-09 ENCOUNTER — Other Ambulatory Visit: Payer: Self-pay

## 2021-12-09 DIAGNOSIS — N390 Urinary tract infection, site not specified: Secondary | ICD-10-CM | POA: Insufficient documentation

## 2021-12-09 DIAGNOSIS — R339 Retention of urine, unspecified: Secondary | ICD-10-CM

## 2021-12-09 DIAGNOSIS — E119 Type 2 diabetes mellitus without complications: Secondary | ICD-10-CM | POA: Insufficient documentation

## 2021-12-09 LAB — URINALYSIS, COMPLETE (UACMP) WITH MICROSCOPIC
RBC / HPF: >50 RBC/hpf — ABNORMAL HIGH (ref 0–5)
Specific Gravity, Urine: 1.01 (ref 1.005–1.030)
Squamous Epithelial / HPF: NONE SEEN (ref 0–5)
WBC, UA: >50 WBC/hpf — ABNORMAL HIGH (ref 0–5)

## 2021-12-09 MED ORDER — LEVOFLOXACIN 500 MG PO TABS: 500.0000 mg | ORAL_TABLET | Freq: Every day | ORAL | 0 refills | Status: AC

## 2021-12-09 NOTE — ED Notes (Signed)
Pt reports he eats a lot of beets.  ?

## 2021-12-09 NOTE — ED Notes (Signed)
Pt switched to leg bag from standard bag. ?

## 2021-12-09 NOTE — ED Notes (Signed)
Coude cath removed. Blood noted at penis and in old urine drainage bag. Provider notified coude cath order will be needed. ?

## 2021-12-09 NOTE — Discharge Instructions (Signed)
Take the antibiotic as prescribed.  Follow-up with your urologist, call make an appointment.  Or follow-up with Dr. Lonna Cobb who is urologist here in town. ?Continue your regular medications. ?

## 2021-12-09 NOTE — ED Notes (Signed)
Foley tubing cut down and resealed and leg bag drained at this time.  ?

## 2021-12-09 NOTE — ED Triage Notes (Signed)
Pt states that he has a urinary catheter that is clogged- pt states he feels like his bladder is full ?

## 2021-12-09 NOTE — ED Provider Notes (Signed)
? ?[facility] ?Provider Note ? ? ? Event Date/Time  ? First MD Initiated Contact with Patient 12/09/21 [phone]   ?  (approximate) ? ? ?History  ? ?Urinary Retention ? ? ?HPI ? ?Tyler [name] is a 76 y.o. male with history of diabetes, urinary retention, and A-fib presents emergency department complaining of urinary retention.  Patient already has a Foley catheter but thinks that it is clogged.  States he has had the catheter for "quite a while ".  Has not given me a proper timeline on when the Foley was inserted.  Patient states he has some urine output last night.  States since after midnight he has not been able to have any urine output through the catheter.  Starting to feel a lot of pressure in the bladder.  He denies fever or chills. ? ?  ? ? ?Physical Exam  ? ?Triage Vital Signs: ?ED Triage Vitals  ?Enc Vitals Group  ?   BP 12/09/21 0908 (!) 145/91  ?   Pulse Rate 12/09/21 0908 (!) 103  ?   Resp 12/09/21 0908 18  ?   Temp 12/09/21 0907 97.7 ?F (36.5 ?C)  ?   Temp Source 12/09/21 0907 Oral  ?   SpO2 12/09/21 0908 97 %  ?   Weight 12/09/21 0907 235 lb (106.6 kg)  ?   Height 12/09/21 0907 6\' 6"  (1.981 m)  ?   Head Circumference --   ?   Peak Flow --   ?   Pain Score 12/09/21 0907 3  ?   Pain Loc --   ?   Pain Edu? --   ?   Excl. in GC? --   ? ? ?Most recent vital signs: ?Vitals:  ? 12/09/21 1058 12/09/21 1100  ?BP:  126/62  ?Pulse:  84  ?Resp:  18  ?Temp: 98.3 ?F (36.8 ?C)   ?SpO2:  94%  ? ? ? ?General: Awake, no distress.   ?CV:  Good peripheral perfusion. regular rate and  rhythm ?Resp:  Normal effort. ?Abd:  No distention.  Tender near the bladder ?Other:  Patient smells of urine ? ? ?ED Results / Procedures / Treatments  ? ?Labs ?(all labs ordered are listed, but only abnormal results are displayed) ?Labs Reviewed  ?URINALYSIS, COMPLETE (UACMP) WITH MICROSCOPIC - Abnormal; Notable for the following components:  ?    Result Value  ? Color, Urine RED (*)   ? APPearance CLOUDY (*)   ?  Glucose, UA   (*)   ? Value: TEST NOT REPORTED DUE TO COLOR INTERFERENCE OF URINE PIGMENT  ? Hgb urine dipstick   (*)   ? Value: TEST NOT REPORTED DUE TO COLOR INTERFERENCE OF URINE PIGMENT  ? Bilirubin Urine   (*)   ? Value: TEST NOT REPORTED DUE TO COLOR INTERFERENCE OF URINE PIGMENT  ? Ketones, ur   (*)   ? Value: TEST NOT REPORTED DUE TO COLOR INTERFERENCE OF URINE PIGMENT  ? Protein, ur   (*)   ? Value: TEST NOT REPORTED DUE TO COLOR INTERFERENCE OF URINE PIGMENT  ? Nitrite   (*)   ? Value: TEST NOT REPORTED DUE TO COLOR INTERFERENCE OF URINE PIGMENT  ? Leukocytes,Ua   (*)   ? Value: TEST NOT REPORTED DUE TO COLOR INTERFERENCE OF URINE PIGMENT  ? RBC / HPF >50 (*)   ? WBC, UA >50 (*)   ? Bacteria, UA MANY (*)   ? All other components within  normal limits  ?URINE CULTURE  ? ? ? ?EKG ? ? ? ? ?RADIOLOGY ?Bladder scan ? ? ? ?PROCEDURES: ? ? ?Procedures ? ? ?MEDICATIONS ORDERED IN ED: ?Medications - No data to display ? ? ?IMPRESSION / MDM / ASSESSMENT AND PLAN / ED COURSE  ?I reviewed the triage vital signs and the nursing notes. ?             ?               ? ?Differential diagnosis includes, but is not limited to, urinary retention, UTI, pyelonephritis ? ?Urinalysis shows greater than 50 RBCs greater than 50 WBCs, many bacteria and white blood cell clumps.  This was from the new bag of urine after nursing staff had removed 950 mL of urine where his previous catheter was clogged. ? ?Patient be placed on antibiotic.  He is to follow-up with his regular urologist who is in [place].  Or he can follow-up with Dr. Lonna Cobb here in [place].  The patient is in agreement with treatment plan.  I do not feel that he is septic as his heart rate is normal and he is afebrile and very alert.  He was discharged stable condition peer ? ? ?  ? ? ?FINAL CLINICAL IMPRESSION(S) / ED DIAGNOSES  ? ?Final diagnoses:  ?Urinary retention  ?Acute UTI  ? ? ? ?Rx / DC Orders  ? ?ED Discharge Orders   ? ?      Ordered  ?   levofloxacin (LEVAQUIN) 500 MG tablet  Daily       ? 12/09/21 1117  ? ?  ?  ? ?  ? ? ? ?Note:  This document was prepared using Dragon voice recognition software and may include unintentional dictation errors. ? ?  ?Faythe Ghee, PA-C ?12/09/21 1120 ? ?  ?Jene Every, MD ?12/09/21 1138 ? ?

## 2021-12-09 NOTE — ED Notes (Signed)
16 Fr coude cath placed. Pink/red urine noted.  ?

## 2021-12-09 NOTE — ED Notes (Signed)
Bladder scanner reading 650cc.  ?

## 2021-12-09 NOTE — ED Notes (Signed)
Pt reports foley cath clotted up since last night. Pt's urine malodorous; could smell immediately upon entering room. Pt reports abdominal tenderness. Pt calm, skin dry, resp reg/unlabored.  ?

## 2021-12-10 LAB — URINE CULTURE

## 2022-01-29 DIAGNOSIS — I495 Sick sinus syndrome: Secondary | ICD-10-CM | POA: Diagnosis not present

## 2022-03-12 DIAGNOSIS — I495 Sick sinus syndrome: Secondary | ICD-10-CM | POA: Diagnosis not present

## 2022-03-12 DIAGNOSIS — R001 Bradycardia, unspecified: Secondary | ICD-10-CM | POA: Diagnosis not present

## 2022-03-12 DIAGNOSIS — I4819 Other persistent atrial fibrillation: Secondary | ICD-10-CM | POA: Diagnosis not present

## 2022-03-12 DIAGNOSIS — R609 Edema, unspecified: Secondary | ICD-10-CM | POA: Diagnosis not present

## 2022-05-27 ENCOUNTER — Other Ambulatory Visit: Payer: Self-pay

## 2022-05-27 DIAGNOSIS — N390 Urinary tract infection, site not specified: Secondary | ICD-10-CM | POA: Insufficient documentation

## 2022-05-27 DIAGNOSIS — E875 Hyperkalemia: Secondary | ICD-10-CM | POA: Insufficient documentation

## 2022-05-27 DIAGNOSIS — T83511A Infection and inflammatory reaction due to indwelling urethral catheter, initial encounter: Secondary | ICD-10-CM | POA: Diagnosis not present

## 2022-05-27 DIAGNOSIS — T83091A Other mechanical complication of indwelling urethral catheter, initial encounter: Secondary | ICD-10-CM | POA: Diagnosis not present

## 2022-05-27 DIAGNOSIS — N179 Acute kidney failure, unspecified: Secondary | ICD-10-CM | POA: Insufficient documentation

## 2022-05-27 LAB — COMPREHENSIVE METABOLIC PANEL
ALT: 15 U/L (ref 0–44)
AST: 31 U/L (ref 15–41)
Albumin: 4 g/dL (ref 3.5–5.0)
Alkaline Phosphatase: 46 U/L (ref 38–126)
Anion gap: 14 (ref 5–15)
BUN: 41 mg/dL — ABNORMAL HIGH (ref 8–23)
CO2: 20 mmol/L — ABNORMAL LOW (ref 22–32)
Calcium: 9 mg/dL (ref 8.9–10.3)
Chloride: 102 mmol/L (ref 98–111)
Creatinine, Ser: 1.59 mg/dL — ABNORMAL HIGH (ref 0.61–1.24)
GFR, Estimated: 44 mL/min — ABNORMAL LOW (ref >60)
Glucose, Bld: 293 mg/dL — ABNORMAL HIGH (ref 70–99)
Potassium: 5.2 mmol/L — ABNORMAL HIGH (ref 3.5–5.1)
Sodium: 136 mmol/L (ref 135–145)
Total Bilirubin: 1 mg/dL (ref 0.3–1.2)
Total Protein: 7.6 g/dL (ref 6.5–8.1)

## 2022-05-27 LAB — URINALYSIS, ROUTINE W REFLEX MICROSCOPIC
Bilirubin Urine: NEGATIVE
Glucose, UA: NEGATIVE mg/dL
Hgb urine dipstick: NEGATIVE
Ketones, ur: NEGATIVE mg/dL
Nitrite: POSITIVE — AB
Protein, ur: NEGATIVE mg/dL
Specific Gravity, Urine: 1.015 (ref 1.005–1.030)
Squamous Epithelial / HPF: NONE SEEN (ref 0–5)
pH: 6 (ref 5.0–8.0)

## 2022-05-27 LAB — CBC WITH DIFFERENTIAL/PLATELET
Abs Immature Granulocytes: 0.05 10*3/uL (ref 0.00–0.07)
Basophils Absolute: 0.1 10*3/uL (ref 0.0–0.1)
Basophils Relative: 1 %
Eosinophils Absolute: 0.4 10*3/uL (ref 0.0–0.5)
Eosinophils Relative: 5 %
HCT: 41.3 % (ref 39.0–52.0)
Hemoglobin: 13.5 g/dL (ref 13.0–17.0)
Immature Granulocytes: 1 %
Lymphocytes Relative: 22 %
Lymphs Abs: 1.9 10*3/uL (ref 0.7–4.0)
MCH: 30.8 pg (ref 26.0–34.0)
MCHC: 32.7 g/dL (ref 30.0–36.0)
MCV: 94.3 fL (ref 80.0–100.0)
Monocytes Absolute: 0.6 10*3/uL (ref 0.1–1.0)
Monocytes Relative: 7 %
Neutro Abs: 5.6 10*3/uL (ref 1.7–7.7)
Neutrophils Relative %: 64 %
Platelets: 177 10*3/uL (ref 150–400)
RBC: 4.38 MIL/uL (ref 4.22–5.81)
RDW: 13.5 % (ref 11.5–15.5)
WBC: 8.6 10*3/uL (ref 4.0–10.5)
nRBC: 0 % (ref 0.0–0.2)

## 2022-05-27 NOTE — ED Triage Notes (Signed)
Pt ambulatory to triage with c/o Foley catheter not draining. Pt has had Foley in place x 3 months without being changed. Noticed decreased flow today.

## 2022-05-27 NOTE — ED Notes (Signed)
Bladder scan noted to show 

## 2022-05-28 ENCOUNTER — Emergency Department
Admission: EM | Admit: 2022-05-28 | Discharge: 2022-05-28 | Disposition: A | Discharge status: Home / Self Care | Payer: BC Managed Care – PPO | Attending: Emergency Medicine | Admitting: Emergency Medicine

## 2022-05-28 DIAGNOSIS — N179 Acute kidney failure, unspecified: Secondary | ICD-10-CM

## 2022-05-28 DIAGNOSIS — N390 Urinary tract infection, site not specified: Secondary | ICD-10-CM

## 2022-05-28 LAB — BASIC METABOLIC PANEL
Anion gap: 7 (ref 5–15)
BUN: 40 mg/dL — ABNORMAL HIGH (ref 8–23)
CO2: 22 mmol/L (ref 22–32)
Calcium: 8.7 mg/dL — ABNORMAL LOW (ref 8.9–10.3)
Chloride: 108 mmol/L (ref 98–111)
Creatinine, Ser: 1.38 mg/dL — ABNORMAL HIGH (ref 0.61–1.24)
GFR, Estimated: 53 mL/min — ABNORMAL LOW (ref >60)
Glucose, Bld: 160 mg/dL — ABNORMAL HIGH (ref 70–99)
Potassium: 4.5 mmol/L (ref 3.5–5.1)
Sodium: 137 mmol/L (ref 135–145)

## 2022-05-28 LAB — URINALYSIS, ROUTINE W REFLEX MICROSCOPIC
Bilirubin Urine: NEGATIVE
Glucose, UA: 150 mg/dL — AB
Hgb urine dipstick: NEGATIVE
Ketones, ur: NEGATIVE mg/dL
Nitrite: POSITIVE — AB
Protein, ur: NEGATIVE mg/dL
Specific Gravity, Urine: 1.018 (ref 1.005–1.030)
Squamous Epithelial / HPF: NONE SEEN (ref 0–5)
WBC, UA: >50 WBC/hpf — ABNORMAL HIGH (ref 0–5)
pH: 6 (ref 5.0–8.0)

## 2022-05-28 MED ORDER — LACTATED RINGERS IV BOLUS
1000.0000 mL | Freq: Once | INTRAVENOUS | Status: AC
  Administered 2022-05-28: 1000 mL via INTRAVENOUS

## 2022-05-28 MED ORDER — CEFDINIR 300 MG PO CAPS: 300.0000 mg | ORAL_CAPSULE | Freq: Two times a day (BID) | ORAL | 0 refills | Status: AC

## 2022-05-28 MED ORDER — SODIUM CHLORIDE 0.9 % IV SOLN
2.0000 g | Freq: Once | INTRAVENOUS | Status: AC
  Administered 2022-05-28: 2 g via INTRAVENOUS
  Filled 2022-05-28: qty 20

## 2022-05-28 NOTE — ED Provider Notes (Signed)
[facility] Provider Note    Event Date/Time   First MD Initiated Contact with Patient 05/28/22 [phone]     (approximate)   History   No chief complaint on file.   HPI  Tyler [name] is a 76 y.o. male who presents to the ED for evaluation of No chief complaint on file.   I review an ED visit from 3/18 where he was seen for urinary retention and a foley was placed.  Hx BPH and longstanding indwelling foley use. I review a Urology visit from 08/01/2021 where his foley was exchanged.   Patient presents to the ED because his catheter was not draining.  Denies any fevers, abdominal pain, emesis or systemic symptoms.  Has not been changed since he was here last in March.  Has not seen a urologist since last year.  Physical Exam   Triage Vital Signs: ED Triage Vitals  Enc Vitals Group     BP 05/27/22 2118 (!) 169/84     Pulse Rate 05/27/22 2118 (!) 108     Resp 05/27/22 2118 19     Temp 05/27/22 2118 97.9 F (36.6 C)     Temp Source 05/27/22 2118 Oral     SpO2 05/27/22 2118 99 %     Weight 05/27/22 2119 235 lb (106.6 kg)     Height 05/27/22 2119 6\' 6"  (1.981 m)     Head Circumference --      Peak Flow --      Pain Score 05/27/22 2119 7     Pain Loc --      Pain Edu? --      Excl. in GC? --    Most recent vital signs: Vitals:   05/28/22 0010 05/28/22 0103  BP: (!) 165/91 (!) 153/87  Pulse: 95 87  Resp: 20 17  Temp: 97.9 F (36.6 C) 97.9 F (36.6 C)  SpO2: 95% 100%    General: Awake, no distress.  Sitting up in recliner watching TV, looks well. CV:  Good peripheral perfusion.  Resp:  Normal effort.  Abd:  No distention.  Minimal suprapubic tenderness, otherwise benign. MSK:  No deformity noted.  Neuro:  No focal deficits appreciated. Other:     ED Results / Procedures / Treatments   Labs (all labs ordered are listed, but only abnormal results are displayed) Labs Reviewed  URINALYSIS, ROUTINE W REFLEX MICROSCOPIC - Abnormal; Notable  for the following components:      Result Value   Color, Urine YELLOW (*)    APPearance CLOUDY (*)    Nitrite POSITIVE (*)    Leukocytes,Ua MODERATE (*)    Bacteria, UA RARE (*)    All other components within normal limits  COMPREHENSIVE METABOLIC PANEL - Abnormal; Notable for the following components:   Potassium 5.2 (*)    CO2 20 (*)    Glucose, Bld 293 (*)    BUN 41 (*)    Creatinine, Ser 1.59 (*)    GFR, Estimated 44 (*)    All other components within normal limits  URINALYSIS, ROUTINE W REFLEX MICROSCOPIC - Abnormal; Notable for the following components:   Color, Urine YELLOW (*)    APPearance CLOUDY (*)    Glucose, UA 150 (*)    Nitrite POSITIVE (*)    Leukocytes,Ua MODERATE (*)    WBC, UA >50 (*)    Bacteria, UA RARE (*)    All other components within normal limits  BASIC METABOLIC PANEL - Abnormal; Notable  for the following components:   Glucose, Bld 160 (*)    BUN 40 (*)    Creatinine, Ser 1.38 (*)    Calcium 8.7 (*)    GFR, Estimated 53 (*)    All other components within normal limits  URINE CULTURE  CBC WITH DIFFERENTIAL/PLATELET  CBC WITH DIFFERENTIAL/PLATELET    EKG   RADIOLOGY   Official radiology report(s): No results found.  PROCEDURES and INTERVENTIONS:  .1-3 Lead EKG Interpretation  Performed by: Delton Prairie, MD Authorized by: Delton Prairie, MD     Interpretation: normal     ECG rate:  80   ECG rate assessment: normal     Rhythm: sinus rhythm     Ectopy: none     Conduction: normal     Medications  lactated ringers bolus 1,000 mL (0 mLs Intravenous Stopped 05/28/22 0328)  cefTRIAXone (ROCEPHIN) 2 g in sodium chloride 0.9 % 100 mL IVPB (0 g Intravenous Stopped 05/28/22 0240)     IMPRESSION / MDM / ASSESSMENT AND PLAN / ED COURSE  I reviewed the triage vital signs and the nursing notes.  Differential diagnosis includes, but is not limited to, cystitis, sepsis, AKI, BPH, catheter malfunction  {Patient presents with symptoms of an acute  illness or injury that is potentially life-threatening.  76 year old male presents to the ED with a nondraining longstanding indwelling Foley catheter, possibly due to acute cystitis.  He looks systemically well with reassuring vital signs.  Blood work with mild hyperkalemia and slight worsening of his CKD from baseline.  Normal CBC and no signs of sepsis or systemic illness.  After replacing his Foley catheter, urinalysis off of this fresh line is concerning for infection.  We will send this for culture and start the patient on antibiotics.  After IV fluids and antibiotics, patient is requesting discharge.  Repeat metabolic panel for prematures renal dysfunction and hyperkalemia.  I considered observation admission for this patient, but considering how well he looks and his insistence on going home I think this is reasonable to try.  Discharged with cefdinir and return precautions.  Provided follow-up information with urology.  Clinical Course as of 05/28/22 0408  Mon May 28, 2022  0402 Repeat metabolic panel with improved renal function and potassium.  Patient continues to feel well and is requesting discharge. [DS]    Clinical Course User Index [DS] Delton Prairie, MD     FINAL CLINICAL IMPRESSION(S) / ED DIAGNOSES   Final diagnoses:  Urinary tract infection associated with indwelling urethral catheter, initial encounter (HCC)  AKI (acute kidney injury) (HCC)     Rx / DC Orders   ED Discharge Orders          Ordered    cefdinir (OMNICEF) 300 MG capsule  2 times daily        05/28/22 0356             Note:  This document was prepared using Dragon voice recognition software and may include unintentional dictation errors.   Delton Prairie, MD 05/28/22 [phone]

## 2022-05-28 NOTE — ED Notes (Signed)
Bladder scan preformed at this time. or more in pt bladder at this time. Morrie Sheldon, RN made aware. New foley to be inserted in triage.

## 2022-05-30 LAB — URINE CULTURE: Culture: >=100000 — AB

## 2022-05-31 NOTE — Consult Note (Signed)
Urine culture positive for 100k colonies for enterobacter cloacae. Pt was discharged on cefdinir and the pathogen was resistant to cefazolin. Case discussed with Dr. Modesto Charon, called in prescription for ciprofloxacin 500 mg BID x 5 days called into a [facility] on [address]. Patient was information of the culture results.   Thanks,  Paschal Dopp, PharmD, BCPS

## 2022-06-20 ENCOUNTER — Ambulatory Visit: Payer: BC Managed Care – PPO | Admitting: Urology

## 2022-06-25 ENCOUNTER — Encounter: Payer: Self-pay | Admitting: Urology

## 2022-06-25 ENCOUNTER — Ambulatory Visit (INDEPENDENT_AMBULATORY_CARE_PROVIDER_SITE_OTHER): Payer: BC Managed Care – PPO | Admitting: Urology

## 2022-06-25 VITALS — BP 167/82 | HR 98 | Ht 78.0 in | Wt 250.0 lb

## 2022-06-25 DIAGNOSIS — N401 Enlarged prostate with lower urinary tract symptoms: Secondary | ICD-10-CM

## 2022-06-25 DIAGNOSIS — R338 Other retention of urine: Secondary | ICD-10-CM | POA: Diagnosis not present

## 2022-06-25 NOTE — Patient Instructions (Signed)
[address], [place], [place] [address] [phone]

## 2022-06-25 NOTE — Progress Notes (Signed)
06/25/2022 3:25 PM   Mayra Neer Chakraborty [birth year] [id number]  Referring provider: No referring provider defined for this encounter.  Chief Complaint  Patient presents with   Recurrent UTI    HPI: Coden Franchi Minchey is a 76 y.o. male who presents in follow-up of a recent ED visit.  Underwent planned outpatient cardiac ablation at [facility] in [facility] 09/07/2020 however required admission secondary to persistent O2 requirements, large left pleural effusion and CHF Follow-up urinary retention during that hospitalization and review of discharge summary remarkable for 4000 cc drained from the bladder with purulent urine and culture growing Proteus.  No urinary tract calculi noted on CT Urology consultation was obtained during that hospitalization with recommendation of starting tamsulosin and urology follow-up He had several visits with [facility] urology in [place].  He did fail at least 2 voiding trials.   Urodynamic study 01/18/2021 showed a 700 mL bladder capacity with for sensation at 565 mL.  He was not able to void and apparently did not generate a detrusor contraction.  Cystoscopy 02/15/2021 showed moderate lateral lobe enlargement and small intravesical median lobe.  Urology visit August 2022 HoLEP was discussed and was going to be scheduled however this did not occur and he elected a chronic indwelling Foley.  Last urology visit was 08/01/2021. Presented to [facility] ED 05/28/2022 complaining of a nondraining Foley.  His catheter was changed.  Although asymptomatic he was treated for a "UTI".  Urine culture grew Enterobacter most likely representing colonization Prostate volume calculated from CT December 2021 was 60 cc    PMH: Past Medical History:  Diagnosis Date   A-fib (HCC)    Arthritis    Diabetes mellitus without complication (HCC)    Urinary retention    chronic, with indwelling Foley catheter and plans for a suprapubic   Wears dentures    full upper    Surgical  History: Past Surgical History:  Procedure Laterality Date   CATARACT EXTRACTION W/PHACO Right 01/16/2017   Procedure: CATARACT EXTRACTION PHACO AND INTRAOCULAR LENS PLACEMENT (IOC)  Right Complicated;  Surgeon: Lockie Mola, MD;  Location[facility];  Service: Ophthalmology;  Laterality: Right;  IVA Block Healon 5 malyugin vision blue Diabetic - oral meds   METATARSAL HEAD EXCISION Left 07/05/2018   Procedure: RESECTION FIRST METATARSAL INFECTED BONE AND SOFT TISSUE;  Surgeon: Recardo Evangelist, [name];  [name];  Service: Podiatry;  Laterality: Left;   TOE AMPUTATION Left    TONSILLECTOMY      Home Medications:  Allergies as of 06/25/2022   No Known Allergies      Medication List        Accurate as of June 25, 2022  3:25 PM. If you have any questions, ask your nurse or doctor.          Eliquis 2.5 MG Tabs tablet Generic drug: apixaban Take 2.5 mg by mouth 2 (two) times daily.   furosemide 40 MG tablet Commonly known as: LASIX Take 40 mg by mouth daily.   glipiZIDE 10 MG tablet Commonly known as: GLUCOTROL Take 10 mg by mouth 2 (two) times daily.   lisinopril 20 MG tablet Commonly known as: ZESTRIL Take 30 mg by mouth daily. What changed: Another medication with the same name was removed. Continue taking this medication, and follow the directions you see here. Changed by: Riki Altes, MD   metFORMIN 1000 MG tablet Commonly known as: GLUCOPHAGE Take 1,000 mg by mouth 2 (two) times daily.   metolazone 2.5 MG  tablet Commonly known as: ZAROXOLYN Take 2.5 mg by mouth once a week.   ondansetron 4 MG disintegrating tablet Commonly known as: ZOFRAN-ODT Take 1 tablet (4 mg total) by mouth every 8 (eight) hours as needed for nausea or vomiting.   pantoprazole 40 MG tablet Commonly known as: PROTONIX Take 40 mg by mouth 2 (two) times daily.   pravastatin 20 MG tablet Commonly known as: PRAVACHOL Take 20 mg by mouth every evening.    predniSONE 10 MG (21) Tbpk tablet Commonly known as: STERAPRED UNI-PAK 21 TAB 6 tablets on day 1, then 5 tablets on day 2, then 4 tablets on day 3, then 3 tablets on day 4, then 2 tablets on day 5, then 1 tablet on day 6.        Allergies: No Known Allergies  Family History: History reviewed. No pertinent family history.  Social History:  reports that he has never smoked. He has never used smokeless tobacco. He reports that he does not drink alcohol. No history on file for drug use.   Physical Exam: BP (!) 167/82   Pulse 98   Ht 6\' 6"  (1.981 m)   Wt 250 lb (113.4 kg)   BMI 28.89 kg/m   Constitutional:  Alert and oriented, No acute distress. HEENT: [place] AT, Respiratory: Normal respiratory effort, no increased work of breathing. GI: Abdomen is soft, nontender, nondistended, no abdominal masses GU: Catheter draining clear urine Skin: No rashes, bruises or suspicious lesions. Neurologic: Grossly intact, no focal deficits, moving all 4 extremities. Psychiatric: Normal mood and affect.   Assessment & Plan:    1.  Urinary retention He states he is now interested in pursuing outlet surgery.  He lives in [place] and was getting his care in [place] at the time because he was planning on moving to that area but has no plans to move at this time His urodynamic study tracings were not available for review and only the data was reviewed on CareEverywhere.  We discussed that if he is not able to generate a detrusor contraction then outlet procedure may not improve his emptying Will schedule follow-up cystoscopy and TRUS for volume.  If his volume is 60 cc he would potentially be a UroLift candidate and he was provided literature  I spent 50 total minutes on the day of the encounter including pre-visit review of the medical record, face-to-face time with the patient, and post visit ordering of labs/imaging/tests.   Abbie Sons, [place] [address], [place], [place] [address] [phone]

## 2022-07-13 ENCOUNTER — Encounter: Payer: BC Managed Care – PPO | Admitting: Urology

## 2022-07-13 ENCOUNTER — Encounter: Payer: Self-pay | Admitting: Urology

## 2022-07-20 ENCOUNTER — Ambulatory Visit (INDEPENDENT_AMBULATORY_CARE_PROVIDER_SITE_OTHER): Payer: BC Managed Care – PPO | Admitting: Urology

## 2022-07-20 ENCOUNTER — Encounter: Payer: Self-pay | Admitting: Urology

## 2022-07-20 VITALS — BP 168/81 | HR 98 | Ht 73.0 in | Wt 250.0 lb

## 2022-07-20 DIAGNOSIS — N401 Enlarged prostate with lower urinary tract symptoms: Secondary | ICD-10-CM | POA: Diagnosis not present

## 2022-07-20 DIAGNOSIS — R338 Other retention of urine: Secondary | ICD-10-CM | POA: Diagnosis not present

## 2022-07-20 NOTE — Progress Notes (Signed)
07/20/22  Chief Complaint  Patient presents with   Cysto     HPI: 76 y.o. 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    Cystoscopy Procedure Note  Patient identification was confirmed, informed consent was obtained, and patient was prepped using Betadine solution.  Lidocaine jelly was administered per urethral meatus.     Pre-Procedure: - Inspection reveals a normal caliber urethral meatus.  Procedure: The flexible cystoscope was introduced without difficulty - No urethral strictures/lesions are present. -  Prominent lateral lobe enlargement  prostate  - Normal bladder neck - Bilateral ureteral orifices identified - Bladder mucosa reveals no solid or papillary lesions; inflammatory changes bladder base secondary indwelling Foley - No bladder stones -Moderate trabeculation  Retroflexion shows no intravesical median lobe   Post-Procedure: - Patient tolerated the procedure well   Prostate transrectal ultrasound sizing   Informed consent was obtained after discussing risks/benefits of the procedure.  A time out was performed to ensure correct patient identity.   Pre-Procedure: -Transrectal probe was placed without difficulty -Transrectal Ultrasound performed revealing a 48 gm prostate measuring 3.8 x 5.0 x 4.8 cm (length) -No significant hypoechoic or median lobe noted      Assessment/ Plan: Moderate BPH with urinary retention He is interested in pursuing an outlet procedure.  He would be a candidate for UroLift which was discussed in detail HoLEP was also discussed and he was interested in more information.  Will discuss with one of my partners. Foley catheter was replaced   Abbie Sons, MD

## 2022-07-20 NOTE — Progress Notes (Signed)
Cath Change/ Replacement  Patient is present today for a catheter change due to urinary retention.  25ml of water was removed from the balloon, a 16 coude foley cath was removed without difficulty.  Patient was cleaned and prepped in a sterile fashion with betadine and 2% lidocaine jelly was instilled into the urethra. A 16 coude foley cath was replaced into the bladder, no complications were noted. Urine return was noted 167ml and urine was light yellow in color. The balloon was filled with 74ml of sterile water. A leg bag was attached for drainage. Patient was given proper instruction on catheter care.    Performed by: Elberta Leatherwood, [place]

## 2022-07-26 DIAGNOSIS — E119 Type 2 diabetes mellitus without complications: Secondary | ICD-10-CM | POA: Diagnosis not present

## 2022-07-26 DIAGNOSIS — I10 Essential (primary) hypertension: Secondary | ICD-10-CM | POA: Diagnosis not present

## 2022-07-26 DIAGNOSIS — E782 Mixed hyperlipidemia: Secondary | ICD-10-CM | POA: Diagnosis not present

## 2022-07-26 DIAGNOSIS — I5032 Chronic diastolic (congestive) heart failure: Secondary | ICD-10-CM | POA: Diagnosis not present

## 2022-07-26 DIAGNOSIS — Z9189 Other specified personal risk factors, not elsewhere classified: Secondary | ICD-10-CM | POA: Diagnosis not present

## 2022-08-10 ENCOUNTER — Other Ambulatory Visit: Payer: Self-pay

## 2022-08-10 ENCOUNTER — Emergency Department
Admission: EM | Admit: 2022-08-10 | Discharge: 2022-08-10 | Disposition: A | Discharge status: Home / Self Care | Payer: BC Managed Care – PPO | Attending: Emergency Medicine | Admitting: Emergency Medicine

## 2022-08-10 DIAGNOSIS — R300 Dysuria: Secondary | ICD-10-CM | POA: Diagnosis not present

## 2022-08-10 DIAGNOSIS — E119 Type 2 diabetes mellitus without complications: Secondary | ICD-10-CM | POA: Diagnosis not present

## 2022-08-10 DIAGNOSIS — I509 Heart failure, unspecified: Secondary | ICD-10-CM | POA: Insufficient documentation

## 2022-08-10 DIAGNOSIS — N3000 Acute cystitis without hematuria: Secondary | ICD-10-CM

## 2022-08-10 DIAGNOSIS — I4891 Unspecified atrial fibrillation: Secondary | ICD-10-CM | POA: Insufficient documentation

## 2022-08-10 DIAGNOSIS — Z7901 Long term (current) use of anticoagulants: Secondary | ICD-10-CM | POA: Diagnosis not present

## 2022-08-10 LAB — URINALYSIS, ROUTINE W REFLEX MICROSCOPIC
Bilirubin Urine: NEGATIVE
Glucose, UA: NEGATIVE mg/dL
Hgb urine dipstick: NEGATIVE
Ketones, ur: NEGATIVE mg/dL
Nitrite: NEGATIVE
Protein, ur: NEGATIVE mg/dL
Specific Gravity, Urine: 1.016 (ref 1.005–1.030)
pH: 6 (ref 5.0–8.0)

## 2022-08-10 MED ORDER — CEPHALEXIN 500 MG PO CAPS
500.0000 mg | ORAL_CAPSULE | Freq: Once | ORAL | Status: AC
  Administered 2022-08-10: 500 mg via ORAL
  Filled 2022-08-10: qty 1

## 2022-08-10 MED ORDER — CEPHALEXIN 500 MG PO CAPS: 500.0000 mg | ORAL_CAPSULE | Freq: Three times a day (TID) | ORAL | 0 refills | Status: AC

## 2022-08-10 NOTE — ED Provider Notes (Signed)
[facility] Provider Note    Event Date/Time   First MD Initiated Contact with Patient 08/10/22 1614     (approximate)   History   Dysuria   HPI  Travers Goodley Hoppes is a 76 y.o. male with history of [name], Tyler [name], Tyler [name], Tyler [name] a chronic foley cathter. He denies fever, back pain, or pain in his testicles. Symptoms started a few days ago and [name] worsened.     Physical Exam   Triage Vital Signs:  Today's Vitals   08/10/22 1541 08/10/22 1614 08/10/22 1700  BP:  (!) 143/69 (!) 160/89  Pulse:  73 76  Resp:  18   Temp:  99.3 F (37.4 C)   SpO2:  95% 96%  Weight: 113.4 kg    PainSc: 6   5    Body mass index is 32.98 kg/m.   Most recent vital signs: Vitals:   08/10/22 1614 08/10/22 1700  BP: (!) 143/69 (!) 160/89  Pulse: 73 76  Resp: 18   Temp: 99.3 F (37.4 C)   SpO2: 95% 96%     General: Awake, no distress.  CV:  Good peripheral perfusion.  Resp:  Normal effort.  Abd:  No distention.  Other:  No blood around meatus. No urine leaking. No penile discharge.   ED Results / Procedures / Treatments   Labs (all labs ordered are listed, but only abnormal results are displayed) Labs Reviewed  URINALYSIS, ROUTINE W REFLEX MICROSCOPIC - Abnormal; Notable for the following components:      Result Value   Color, Urine YELLOW (*)    APPearance HAZY (*)    Leukocytes,Ua MODERATE (*)    Bacteria, UA RARE (*)    All other components within normal limits  URINE CULTURE     EKG  Not indicated.   RADIOLOGY  Not indicated.   PROCEDURES:  Critical Care performed: No  Procedures   MEDICATIONS ORDERED IN ED: Medications  cephALEXin (KEFLEX) capsule 500 mg (500 mg Oral Given 08/10/22 2030)     IMPRESSION / MDM / ASSESSMENT AND PLAN / ED COURSE  I reviewed the triage vital signs and the nursing notes.                               Differential diagnosis includes, but is not limited to, acute cystitis, skin irritation from foley catheter  Patient's presentation is most consistent with acute complicated illness / injury requiring diagnostic workup.  76 year old male presents to the ER for penile pain from foley catheter. Catheter [name] been present for the past 2 years since having urinary retention after cardiac ablation. He is currently followed by Dr. Lonna Cobb with last visit in October of 2023. They are planning a "laser procedure" and [name] a scheduled visit for the end of this month.   Patient states that pain he is currently experiencing does not feel like previous UTI. He [name] had no fever, abdominal pain or back pain. He states he is having burning pain throughout his penis. He [name] not noticed any blood and is not leaking from around the catheter.   Urinalysis is positive for moderate leukocytes, 11-20 white blood cells and rare bacteria.  He will be treated with Keflex.  Because he [name] had urine cultures positive for several types of  bacteria over the past couple years, urine culture added.  He will be encouraged to follow-up with his primary care provider or urologist.     FINAL CLINICAL IMPRESSION(S) / ED DIAGNOSES   Final diagnoses:  Acute cystitis without hematuria     Rx / DC Orders   ED Discharge Orders          Ordered    cephALEXin (KEFLEX) 500 MG capsule  3 times daily        08/10/22 2026             Note:  This document was prepared using Dragon voice recognition software and may include unintentional dictation errors.   Chinita Pester, FNP 08/10/22 2034    Pilar Jarvis, MD 08/11/22 2122

## 2022-08-10 NOTE — ED Triage Notes (Signed)
Pt presents via POV c/o penile pain. Reports has an indwelling foley catheter x2 years. Reports burning at penis. Reports thinks catheter may be infected. Ambulatory to triage.

## 2022-08-10 NOTE — Discharge Instructions (Addendum)
  Please take the antibiotics as prescribed and until finished.  Follow-up with urology as scheduled or sooner if symptoms or not improving.  Return to the emergency department for symptoms that change or worsen if you are unable to schedule appointment.

## 2022-08-10 NOTE — ED Notes (Signed)
Insufficient urine was collected from Foley Catheter. New leg bag and new Stat-Loc are in place. Will collected urine from the new bag when there is sufficient quantity. NP aware.

## 2022-08-13 LAB — URINE CULTURE: Culture: >=100000 — AB

## 2022-08-20 DIAGNOSIS — E119 Type 2 diabetes mellitus without complications: Secondary | ICD-10-CM | POA: Diagnosis not present

## 2022-08-23 ENCOUNTER — Encounter: Payer: Self-pay | Admitting: Urology

## 2022-08-23 ENCOUNTER — Ambulatory Visit (INDEPENDENT_AMBULATORY_CARE_PROVIDER_SITE_OTHER): Payer: BC Managed Care – PPO | Admitting: Urology

## 2022-08-23 VITALS — BP 171/74 | HR 105 | Ht 78.0 in | Wt 250.0 lb

## 2022-08-23 DIAGNOSIS — R339 Retention of urine, unspecified: Secondary | ICD-10-CM | POA: Diagnosis not present

## 2022-08-23 DIAGNOSIS — N401 Enlarged prostate with lower urinary tract symptoms: Secondary | ICD-10-CM

## 2022-08-23 DIAGNOSIS — N138 Other obstructive and reflux uropathy: Secondary | ICD-10-CM | POA: Diagnosis not present

## 2022-08-23 NOTE — Patient Instructions (Signed)

## 2022-08-23 NOTE — Progress Notes (Signed)
   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  He has been followed at [facility] in [place], as well as recently locally by Dr. Lonna Cobb.  He has a chronic Foley, and is also had problems with recurrent UTIs.  He underwent evaluation for an outlet procedure with Dr. Lonna Cobb which showed a 50 g prostate, no evidence of urethral strictures or bladder abnormalities, and moderate bladder trabeculations.  He reportedly underwent urodynamics at [facility] that showed sensation at 500 mL, bladder capacity 700 mL, with minimal detrusor function.  He apparently was scheduled for HOLEP with [facility] in the fall 2022, but it sounds like he had second thoughts and opted for chronic Foley.  At this point he is very frustrated with the ongoing catheter multiple ER visits/infections and is interested in an outlet procedure.  I also reviewed the notes from his most recent ER visit on 08/10/2022 when he was reportedly having penile burning, Foley was changed, urine culture grew Klebsiella, and he was started on antibiotics.  He has Eliquis on his med list for A-fib, but he reports he does not take that medication anymore because it makes him bleed too much.  He has a number of cardiac comorbidities including CHF, moderate mitral insufficiency, A-fib/a flutter, indwelling pacemaker.  He is followed by cardiology at [facility].  I reviewed those notes extensively.  We discussed the risks and benefits of HoLEP at length.  The procedure requires general anesthesia and takes 1 to 2 hours, and a holmium laser is used to enucleate the prostate and push this tissue into the bladder.  A morcellator is then used to remove this tissue, which is sent for pathology.  The vast majority(>95%) of patients are able to discharge the same day with a  catheter in place for 2 to 3 days, and will follow-up in clinic for a voiding trial.  We specifically discussed the risks of bleeding, infection, retrograde ejaculation, temporary urgency and urge incontinence, very low risk of long-term incontinence, urethral stricture/bladder neck contracture, pathologic evaluation of prostate tissue and possible detection of prostate cancer or other malignancy, and possible need for additional procedures.  I have very frank conversation with the patient that his options for bladder management or chronic Foley with monthly changes, intermittent catheterization, or consideration of an outlet procedure, with HOLEP being the most likely to allow him to resume spontaneous voiding.  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  We also discussed risk of long-term incontinence.  He understands these risks and would like to proceed with HOLEP to try to resume spontaneous voiding.  Schedule HOLEP, needs cardiology clearance   Sondra Come, MD  [facility] [address] [place], [place] [address] [phone]

## 2022-09-20 ENCOUNTER — Other Ambulatory Visit: Payer: Self-pay

## 2022-09-20 ENCOUNTER — Encounter: Payer: Self-pay | Admitting: Emergency Medicine

## 2022-09-20 ENCOUNTER — Emergency Department: Payer: BC Managed Care – PPO

## 2022-09-20 ENCOUNTER — Emergency Department
Admission: EM | Admit: 2022-09-20 | Discharge: 2022-09-20 | Disposition: A | Discharge status: Home / Self Care | Payer: BC Managed Care – PPO | Attending: Emergency Medicine | Admitting: Emergency Medicine

## 2022-09-20 DIAGNOSIS — Z1152 Encounter for screening for COVID-19: Secondary | ICD-10-CM | POA: Insufficient documentation

## 2022-09-20 DIAGNOSIS — J90 Pleural effusion, not elsewhere classified: Secondary | ICD-10-CM | POA: Diagnosis not present

## 2022-09-20 DIAGNOSIS — J101 Influenza due to other identified influenza virus with other respiratory manifestations: Secondary | ICD-10-CM | POA: Diagnosis not present

## 2022-09-20 DIAGNOSIS — Z8616 Personal history of COVID-19: Secondary | ICD-10-CM | POA: Diagnosis not present

## 2022-09-20 DIAGNOSIS — J40 Bronchitis, not specified as acute or chronic: Secondary | ICD-10-CM | POA: Diagnosis not present

## 2022-09-20 DIAGNOSIS — R0789 Other chest pain: Secondary | ICD-10-CM | POA: Diagnosis not present

## 2022-09-20 DIAGNOSIS — R0602 Shortness of breath: Secondary | ICD-10-CM | POA: Diagnosis not present

## 2022-09-20 DIAGNOSIS — J205 Acute bronchitis due to respiratory syncytial virus: Secondary | ICD-10-CM | POA: Insufficient documentation

## 2022-09-20 DIAGNOSIS — R059 Cough, unspecified: Secondary | ICD-10-CM | POA: Diagnosis not present

## 2022-09-20 LAB — COMPREHENSIVE METABOLIC PANEL
ALT: 19 U/L (ref 0–44)
AST: 20 U/L (ref 15–41)
Albumin: 3.8 g/dL (ref 3.5–5.0)
Alkaline Phosphatase: 62 U/L (ref 38–126)
Anion gap: 9 (ref 5–15)
BUN: 25 mg/dL — ABNORMAL HIGH (ref 8–23)
CO2: 24 mmol/L (ref 22–32)
Calcium: 8.6 mg/dL — ABNORMAL LOW (ref 8.9–10.3)
Chloride: 104 mmol/L (ref 98–111)
Creatinine, Ser: 1.25 mg/dL — ABNORMAL HIGH (ref 0.61–1.24)
GFR, Estimated: 59 mL/min — ABNORMAL LOW (ref >60)
Glucose, Bld: 176 mg/dL — ABNORMAL HIGH (ref 70–99)
Potassium: 4.9 mmol/L (ref 3.5–5.1)
Sodium: 137 mmol/L (ref 135–145)
Total Bilirubin: 0.8 mg/dL (ref 0.3–1.2)
Total Protein: 7.2 g/dL (ref 6.5–8.1)

## 2022-09-20 LAB — CBC WITH DIFFERENTIAL/PLATELET
Abs Immature Granulocytes: 0.03 10*3/uL (ref 0.00–0.07)
Basophils Absolute: 0 10*3/uL (ref 0.0–0.1)
Basophils Relative: 1 %
Eosinophils Absolute: 0.2 10*3/uL (ref 0.0–0.5)
Eosinophils Relative: 4 %
HCT: 40.3 % (ref 39.0–52.0)
Hemoglobin: 13 g/dL (ref 13.0–17.0)
Immature Granulocytes: 1 %
Lymphocytes Relative: 18 %
Lymphs Abs: 1 10*3/uL (ref 0.7–4.0)
MCH: 31.4 pg (ref 26.0–34.0)
MCHC: 32.3 g/dL (ref 30.0–36.0)
MCV: 97.3 fL (ref 80.0–100.0)
Monocytes Absolute: 0.8 10*3/uL (ref 0.1–1.0)
Monocytes Relative: 15 %
Neutro Abs: 3.3 10*3/uL (ref 1.7–7.7)
Neutrophils Relative %: 61 %
Platelets: 157 10*3/uL (ref 150–400)
RBC: 4.14 MIL/uL — ABNORMAL LOW (ref 4.22–5.81)
RDW: 13.4 % (ref 11.5–15.5)
WBC: 5.3 10*3/uL (ref 4.0–10.5)
nRBC: 0 % (ref 0.0–0.2)

## 2022-09-20 LAB — TROPONIN I (HIGH SENSITIVITY)
Troponin I (High Sensitivity): 23 ng/L — ABNORMAL HIGH (ref <18)
Troponin I (High Sensitivity): 24 ng/L — ABNORMAL HIGH (ref <18)

## 2022-09-20 LAB — RESP PANEL BY RT-PCR (RSV, FLU A&B, COVID)  RVPGX2
Influenza A by PCR: POSITIVE — AB
Influenza B by PCR: NEGATIVE
Resp Syncytial Virus by PCR: POSITIVE — AB
SARS Coronavirus 2 by RT PCR: NEGATIVE

## 2022-09-20 MED ORDER — AMOXICILLIN 500 MG PO CAPS: 1000.0000 mg | ORAL_CAPSULE | Freq: Three times a day (TID) | ORAL | 0 refills | Status: AC

## 2022-09-20 MED ORDER — BENZONATATE 100 MG PO CAPS: 100.0000 mg | ORAL_CAPSULE | Freq: Four times a day (QID) | ORAL | 0 refills | Status: DC | PRN

## 2022-09-20 NOTE — ED Triage Notes (Signed)
Patient ambulatory to triage with steady gait, without difficulty or distress noted; pt reports recently tx for bronchitis but cont to have nonprod cough, SHOB and upper CP

## 2022-09-20 NOTE — ED Notes (Signed)
Pt in with co chest pain for over a week states has had increased cough and congestion. No chest pain at this time but does have forceful cough.

## 2022-09-20 NOTE — ED Provider Notes (Signed)
[facility] Provider Note    Event Date/Time   First MD Initiated Contact with Patient 09/20/22 [phone]     (approximate)   History   Chest Pain   HPI  Tyler [name] is a 76 y.o. male who presents with complaints of nonproductive cough, intermittent mild shortness of breath and some chest discomfort for several days.  Patient does not think he has had fevers but he has had significant congestion, he reports having to blow his nose frequently.  He reports some fatigue as well.     Physical Exam   Triage Vital Signs: ED Triage Vitals  Enc Vitals Group     BP 09/20/22 0109 (!) 162/71     Pulse Rate 09/20/22 0109 62     Resp 09/20/22 0109 20     Temp 09/20/22 0109 98.2 F (36.8 C)     Temp Source 09/20/22 0109 Oral     SpO2 09/20/22 0109 97 %     Weight 09/20/22 0107 111.1 kg (245 lb)     Height 09/20/22 0107 1.981 m (6\' 6" )     Head Circumference --      Peak Flow --      Pain Score 09/20/22 0107 5     Pain Loc --      Pain Edu? --      Excl. in GC? --     Most recent vital signs: Vitals:   09/20/22 0423 09/20/22 0952  BP: (!) 153/79 (!) 157/67  Pulse: 85 69  Resp: 20 18  Temp: 98.6 F (37 C)   SpO2: 96% 95%     General: Awake, no distress.  CV:  Good peripheral perfusion.  Regular rate and rhythm Resp:  Normal effort.  Clear to auscultation bilaterally Abd:  No distention.  Other:     ED Results / Procedures / Treatments   Labs (all labs ordered are listed, but only abnormal results are displayed) Labs Reviewed  RESP PANEL BY RT-PCR (RSV, FLU A&B, COVID)  RVPGX2 - Abnormal; Notable for the following components:      Result Value   Influenza A by PCR POSITIVE (*)    Resp Syncytial Virus by PCR POSITIVE (*)    All other components within normal limits  CBC WITH DIFFERENTIAL/PLATELET - Abnormal; Notable for the following components:   RBC 4.14 (*)    All other components within normal limits  COMPREHENSIVE METABOLIC PANEL -  Abnormal; Notable for the following components:   Glucose, Bld 176 (*)    BUN 25 (*)    Creatinine, Ser 1.25 (*)    Calcium 8.6 (*)    GFR, Estimated 59 (*)    All other components within normal limits  TROPONIN I (HIGH SENSITIVITY) - Abnormal; Notable for the following components:   Troponin I (High Sensitivity) 23 (*)    All other components within normal limits  TROPONIN I (HIGH SENSITIVITY) - Abnormal; Notable for the following components:   Troponin I (High Sensitivity) 24 (*)    All other components within normal limits     EKG  ED ECG REPORT I, 09/22/22, the attending physician, personally viewed and interpreted this ECG.  Date: 09/20/2022  Rhythm: normal sinus rhythm QRS Axis: normal Intervals: normal ST/T Wave abnormalities: normal Narrative Interpretation: no evidence of acute ischemia    RADIOLOGY Chest x-ray viewed interpreted by me, no pneumonia    PROCEDURES:  Critical Care performed:   Procedures   MEDICATIONS ORDERED IN  ED: Medications - No data to display   IMPRESSION / MDM / ASSESSMENT AND PLAN / ED COURSE  I reviewed the triage vital signs and the nursing notes. Patient's presentation is most consistent with acute presentation with potential threat to life or bodily function.   Patient presents with complaints as above, low risk for ACS given description, viral illness, pneumonia is a possibility  Lab work reviewed and is quite reassuring, normal high-sensitivity troponins.  However patient's viral swab is positive for RSV and influenza, this is likely causing any of his symptoms.  No evidence of pneumonia on chest x-ray small effusion noted, unclear whether this may be old.  No indication for admission, no hypoxia will discharge with prescriptions, close outpatient follow-up, return precautions discussed       FINAL CLINICAL IMPRESSION(S) / ED DIAGNOSES   Final diagnoses:  Influenza A  RSV bronchitis     Rx / DC Orders    ED Discharge Orders          Ordered    amoxicillin (AMOXIL) 500 MG capsule  3 times daily        09/20/22 0957    benzonatate (TESSALON PERLES) 100 MG capsule  Every 6 hours PRN        09/20/22 0957             Note:  This document was prepared using Dragon voice recognition software and may include unintentional dictation errors.   Jene Every, MD 09/20/22 [phone]

## 2022-10-04 DIAGNOSIS — J329 Chronic sinusitis, unspecified: Secondary | ICD-10-CM | POA: Diagnosis not present

## 2022-10-08 DIAGNOSIS — H2512 Age-related nuclear cataract, left eye: Secondary | ICD-10-CM | POA: Diagnosis not present

## 2022-10-15 ENCOUNTER — Encounter: Payer: Self-pay | Admitting: Ophthalmology

## 2022-10-18 NOTE — Discharge Instructions (Signed)
   Cataract Surgery, Care After ? ?This sheet gives you information about how to care for yourself after your surgery.  Your ophthalmologist may also give you more specific instructions.  If you have problems or questions, contact your doctor at [facility], [phone]. ? ?What can I expect after the surgery? ?It is common to have: ?Itching ?Foreign body sensation (feels like a grain of sand in the eye) ?Watery discharge (excess tearing) ?Sensitivity to light and touch ?Bruising in or around the eye ?Mild blurred vision ? ?Follow these instructions at home: ?Do not touch or rub your eyes. ?You may be told to wear a protective shield or sunglasses to protect your eyes. ?Do not put a contact lens in the operative eye unless your doctor approves. ?Keep the lids and face clean and dry. ?Do not allow water to hit you directly in the face while showering. ?Keep soap and shampoo out of your eyes. ?Do not use eye makeup for 1 week. ? ?Check your eye every day for signs of infection.  Watch for: ?Redness, swelling, or pain. ?Fluid, blood or pus. ?Worsening vision. ?Worsening sensitivity to light or touch. ? ?Activity: ?During the first day, avoid bending over and reading.  You may resume reading and bending the next day. ?Do not drive or use heavy machinery for at least 24 hours. ?Avoid strenuous activities for 1 week.  Activities such as walking, treadmill, exercise bike, and climbing stairs are okay. ?Do not lift heavy (>20 pound) objects for 1 week. ?Do not do yardwork, gardening, or dirty housework (mopping, cleaning bathrooms, vacuuming, etc.) for 1 week. ?Do not swim or use a hot tub for 2 weeks. ?Ask your doctor when you can return to work. ? ?General Instructions: ?Take or apply prescription and over-the-counter medicines as directed by your doctor, including eyedrops and ointments. ?Resume medications discontinued prior to surgery, unless told otherwise by your doctor. ?Keep all follow up appointments as  scheduled. ? ?Contact a health care provider if: ?You have increased bruising around your eye. ?You have pain that is not helped with medication. ?You have a fever. ?You have fluid, pus, or blood coming from your eye or incision. ?Your sensitivity to light gets worse. ?You have spots (floaters) of flashing lights in your vision. ?You have nausea or vomiting. ? ?Go to the nearest emergency room or call 911 if: ?You have sudden loss of vision. ?You have severe, worsening eye pain. ? ?

## 2022-10-23 ENCOUNTER — Encounter: Payer: Self-pay | Admitting: Ophthalmology

## 2022-10-23 ENCOUNTER — Other Ambulatory Visit: Payer: Self-pay

## 2022-10-23 ENCOUNTER — Ambulatory Visit
Admission: RE | Admit: 2022-10-23 | Discharge: 2022-10-23 | Disposition: A | Discharge status: Home / Self Care | Payer: BC Managed Care – PPO | Attending: Ophthalmology | Admitting: Ophthalmology

## 2022-10-23 ENCOUNTER — Ambulatory Visit: Payer: BC Managed Care – PPO | Admitting: Anesthesiology

## 2022-10-23 ENCOUNTER — Encounter
Admission: RE | Disposition: A | Discharge status: Home / Self Care | Payer: Self-pay | Source: Home / Self Care | Attending: Ophthalmology

## 2022-10-23 DIAGNOSIS — E119 Type 2 diabetes mellitus without complications: Secondary | ICD-10-CM | POA: Diagnosis not present

## 2022-10-23 DIAGNOSIS — Z950 Presence of cardiac pacemaker: Secondary | ICD-10-CM | POA: Insufficient documentation

## 2022-10-23 DIAGNOSIS — I5030 Unspecified diastolic (congestive) heart failure: Secondary | ICD-10-CM | POA: Diagnosis not present

## 2022-10-23 DIAGNOSIS — I4891 Unspecified atrial fibrillation: Secondary | ICD-10-CM | POA: Diagnosis not present

## 2022-10-23 DIAGNOSIS — H2589 Other age-related cataract: Secondary | ICD-10-CM | POA: Diagnosis not present

## 2022-10-23 DIAGNOSIS — E1136 Type 2 diabetes mellitus with diabetic cataract: Secondary | ICD-10-CM | POA: Insufficient documentation

## 2022-10-23 DIAGNOSIS — Z89422 Acquired absence of other left toe(s): Secondary | ICD-10-CM | POA: Insufficient documentation

## 2022-10-23 DIAGNOSIS — I495 Sick sinus syndrome: Secondary | ICD-10-CM | POA: Insufficient documentation

## 2022-10-23 DIAGNOSIS — Z6841 Body Mass Index (BMI) 40.0 and over, adult: Secondary | ICD-10-CM | POA: Diagnosis not present

## 2022-10-23 DIAGNOSIS — H2512 Age-related nuclear cataract, left eye: Secondary | ICD-10-CM | POA: Diagnosis not present

## 2022-10-23 HISTORY — DX: Presence of cardiac pacemaker: Z95.0

## 2022-10-23 HISTORY — DX: Sick sinus syndrome: I49.5

## 2022-10-23 HISTORY — PX: CATARACT EXTRACTION W/PHACO: SHX586

## 2022-10-23 LAB — GLUCOSE, CAPILLARY: Glucose-Capillary: 173 mg/dL — ABNORMAL HIGH (ref 70–99)

## 2022-10-23 SURGERY — PHACOEMULSIFICATION, CATARACT, WITH IOL INSERTION
Anesthesia: Monitor Anesthesia Care | Site: Eye | Laterality: Left

## 2022-10-23 MED ORDER — TETRACAINE HCL 0.5 % OP SOLN
1.0000 [drp] | OPHTHALMIC | Status: DC | PRN
  Administered 2022-10-23 (×3): 1 [drp] via OPHTHALMIC

## 2022-10-23 MED ORDER — LIDOCAINE HCL 2 % IJ SOLN
INTRAMUSCULAR | Status: DC | PRN
  Administered 2022-10-23: 6 mL via OPHTHALMIC

## 2022-10-23 MED ORDER — MOXIFLOXACIN HCL 0.5 % OP SOLN
OPHTHALMIC | Status: DC | PRN
  Administered 2022-10-23: .2 mL via OPHTHALMIC

## 2022-10-23 MED ORDER — SIGHTPATH DOSE#1 BSS IO SOLN
INTRAOCULAR | Status: DC | PRN
  Administered 2022-10-23: 1 mL via INTRAMUSCULAR

## 2022-10-23 MED ORDER — TRYPAN BLUE 0.06 % IO SOSY
PREFILLED_SYRINGE | INTRAOCULAR | Status: DC | PRN
  Administered 2022-10-23: .2 mL via INTRAOCULAR

## 2022-10-23 MED ORDER — SIGHTPATH DOSE#1 BSS IO SOLN
INTRAOCULAR | Status: DC | PRN
  Administered 2022-10-23: 5 mL via OPHTHALMIC

## 2022-10-23 MED ORDER — LIDOCAINE HCL (CARDIAC) PF 100 MG/5ML IV SOSY
PREFILLED_SYRINGE | INTRAVENOUS | Status: DC | PRN
  Administered 2022-10-23: 50 mg via INTRATRACHEAL

## 2022-10-23 MED ORDER — FENTANYL CITRATE (PF) 100 MCG/2ML IJ SOLN
INTRAMUSCULAR | Status: DC | PRN
  Administered 2022-10-23 (×2): 50 ug via INTRAVENOUS

## 2022-10-23 MED ORDER — ARMC OPHTHALMIC DILATING DROPS
1.0000 | OPHTHALMIC | Status: DC | PRN
  Administered 2022-10-23 (×3): 1 via OPHTHALMIC

## 2022-10-23 MED ORDER — MIDAZOLAM HCL 2 MG/2ML IJ SOLN
INTRAMUSCULAR | Status: DC | PRN
  Administered 2022-10-23 (×2): 1 mg via INTRAVENOUS

## 2022-10-23 MED ORDER — BRIMONIDINE TARTRATE-TIMOLOL 0.2-0.5 % OP SOLN
OPHTHALMIC | Status: DC | PRN
  Administered 2022-10-23: 1 [drp] via OPHTHALMIC

## 2022-10-23 MED ORDER — NEOMYCIN-POLYMYXIN-DEXAMETH 3.5-10000-0.1 OP OINT
TOPICAL_OINTMENT | OPHTHALMIC | Status: DC | PRN
  Administered 2022-10-23: 1 via OPHTHALMIC

## 2022-10-23 MED ORDER — SIGHTPATH DOSE#1 NA CHONDROIT SULF-NA HYALURON 40-17 MG/ML IO SOLN
INTRAOCULAR | Status: DC | PRN
  Administered 2022-10-23: 1 mL via INTRAOCULAR

## 2022-10-23 MED ORDER — SIGHTPATH DOSE#1 BSS IO SOLN
INTRAOCULAR | Status: DC | PRN
  Administered 2022-10-23 (×4): 15 mL

## 2022-10-23 MED ORDER — PROPOFOL 500 MG/50ML IV EMUL
INTRAVENOUS | Status: DC | PRN
  Administered 2022-10-23: 50 mg via INTRAVENOUS

## 2022-10-23 SURGICAL SUPPLY — 20 items
CANNULA ANT/CHMB 27G (MISCELLANEOUS) IMPLANT
CANNULA ANT/CHMB 27GA (MISCELLANEOUS) ×1 IMPLANT
CATARACT SUITE SIGHTPATH (MISCELLANEOUS) ×1 IMPLANT
CORD BIP STRL DISP 12FT (MISCELLANEOUS) IMPLANT
ERASER HMR WETFIELD 18G (MISCELLANEOUS) IMPLANT
FEE CATARACT SUITE SIGHTPATH (MISCELLANEOUS) ×1 IMPLANT
GLOVE BIOGEL PI IND STRL 8 (GLOVE) ×1 IMPLANT
GLOVE SURG ENC TEXT LTX SZ8 (GLOVE) ×1 IMPLANT
KNIFE 45D UP 2.3 (MISCELLANEOUS) IMPLANT
LENS IOL TECNIS EYHANCE 20.0 (Intraocular Lens) IMPLANT
NDL FILTER BLUNT 18X1 1/2 (NEEDLE) ×1 IMPLANT
NDL RETROBULBAR 25GX1.5 STRL (NEEDLE) IMPLANT
NEEDLE FILTER BLUNT 18X1 1/2 (NEEDLE) ×1 IMPLANT
SPONGE SURG I SPEAR (MISCELLANEOUS) IMPLANT
SUT ETHILON 10-0 CS-B-6CS-B-6 (SUTURE) ×1
SUT VICRYL  9 0 (SUTURE) ×1
SUT VICRYL 9 0 (SUTURE) IMPLANT
SUTURE EHLN 10-0 CS-B-6CS-B-6 (SUTURE) IMPLANT
SYR 3ML LL SCALE MARK (SYRINGE) ×1 IMPLANT
WATER STERILE IRR 250ML POUR (IV SOLUTION) ×1 IMPLANT

## 2022-10-23 NOTE — Transfer of Care (Signed)
Immediate Anesthesia Transfer of Care Note  Patient: Tyler [name]  Procedure(s) Performed: CATARACT EXTRACTION PHACO AND INTRAOCULAR LENS PLACEMENT (IOC) LEFT DIABETIC (Left: Eye)  Patient Location: PACU  Anesthesia Type: MAC  Level of Consciousness: awake, alert  and patient cooperative  Airway and Oxygen Therapy: Patient Spontanous Breathing and Patient connected to supplemental oxygen  Post-op Assessment: Post-op Vital signs reviewed, Patient's Cardiovascular Status Stable, Respiratory Function Stable, Patent Airway and No signs of Nausea or vomiting  Post-op Vital Signs: Reviewed and stable  Complications: No notable events documented.

## 2022-10-23 NOTE — Op Note (Signed)
PREOPERATIVE DIAGNOSIS:  Nuclear sclerotic cataract of the left eye.   POSTOPERATIVE DIAGNOSIS:  Nuclear sclerotic cataract of the left eye.   OPERATIVE PROCEDURE:ORPROCALL@   SURGEON:  Birder Robson, MD.   ANESTHESIA:  Anesthesiologist: Iran Ouch, MD CRNA: Moises Blood, CRNA  1.      Managed anesthesia care. 2.     0.75ml of Shugarcaine was instilled following the paracentesis 3.   6 cc of a retrobulbar block   COMPLICATIONS:  Vision Blue was used to stain the anterior capsule due to very poor/ no visualization of the red reflex.    TECHNIQUE:   MSICS   DESCRIPTION OF PROCEDURE:  The patient was examined and consented in the preoperative holding area where the aforementioned topical anesthesia was applied to the left eye and then brought back to the Operating Room Following an atraumatic retrobulbar block, the left eye was prepped and draped in the usual sterile ophthalmic fashion and a lid speculum was placed. A peritomy was created superiorly 1.75 mm from the limbus. Wet field cautery was applied. A 8 mm partial thickness scleral incision was created. This was then tunneled into the cornea with a crescent blade the chamber was left intact at this step.A paracentesis was created with the side port blade and Vision blue was applied. the anterior chamber was filled with viscoelastic. The corneal incision was opened with the steel keratome. A continuous curvilinear capsulorrhexis was performed with a cystotome followed by the capsulorrhexis forceps. Hydrodissection and hydrodelineation were carried out with BSS on a blunt cannula. The lens was lifted into the [facility] and the lens loop was used to remove it from the eye. 3 10-0 nylon sutures were placed. I/A was applied to clean up the [facility]. The capsule was inspected and found to be intact.Marland Kitchen and the remaining cortical material was removed with the irrigation-aspiration handpiece. The capsular bag was inflated with viscoelastic and the  Technis [id number] lens was placed in the capsular bag without complication.  3 additional 10-0 sutures were placed through the main  incision The remaining viscoelastic was removed from the eye with the irrigation-aspiration handpiece. The wounds were hydrated. The anterior chamber was flushed with BSS and the eye was inflated to physiologic pressure. The peritomy was closed with 2 9-0 vicryl sutures. 0.67ml Vigamox was placed in the anterior chamber. The wounds were found to be water tight. The eye was dressed with Combigan. A gentle pressure patch was applied along with Maxitrol ung. With which to sleep tonight. He will follow up with me in 1 day. Implant Name Type Inv. Item Serial No. Manufacturer Lot No. LRB No. Used Action  LENS IOL TECNIS EYHANCE 20.0 - [id number] Intraocular Lens LENS IOL TECNIS EYHANCE 20.0 [id number] [employer]  Left 1 Implanted    Procedure(s) with comments: CATARACT EXTRACTION PHACO AND INTRAOCULAR LENS PLACEMENT (IOC) LEFT DIABETIC (Left) - Diabetic  Electronically signed: Birder Robson 10/23/2022 2:31 PM

## 2022-10-23 NOTE — Anesthesia Postprocedure Evaluation (Signed)
Anesthesia Post Note  Patient: Tyler [name]  Procedure(s) Performed: CATARACT EXTRACTION PHACO AND INTRAOCULAR LENS PLACEMENT (IOC) LEFT DIABETIC (Left: Eye)  Patient location during evaluation: PACU Anesthesia Type: MAC Level of consciousness: awake and alert Pain management: pain level controlled Vital Signs Assessment: post-procedure vital signs reviewed and stable Respiratory status: spontaneous breathing, nonlabored ventilation and respiratory function stable Cardiovascular status: blood pressure returned to baseline and stable Postop Assessment: no apparent nausea or vomiting Anesthetic complications: no   No notable events documented.   Last Vitals:  Vitals:   10/23/22 1428 10/23/22 1433  BP: 128/70 (!) 141/59  Pulse: 78 64  Resp: (!) 22 20  Temp: 36.4 C 36.4 C  SpO2: 97% 97%    Last Pain:  Vitals:   10/23/22 1433  TempSrc:   PainSc: 0-No pain                 Iran Ouch

## 2022-10-23 NOTE — Anesthesia Preprocedure Evaluation (Addendum)
Anesthesia Evaluation  Patient identified by MRN, date of birth, ID band Patient awake    Reviewed: Allergy & Precautions, H&P , NPO status , Patient's Chart, lab work & pertinent test results  Airway Mallampati: II  TM Distance: >3 FB Neck ROM: full    Dental  (+) Upper Dentures   Pulmonary neg pulmonary ROS   Pulmonary exam normal        Cardiovascular Exercise Tolerance: Good +CHF (HFpEF)  + dysrhythmias (Sick sinus syndrome, afib) + pacemaker + Valvular Problems/Murmurs MR      Neuro/Psych TIA negative psych ROS   GI/Hepatic negative GI ROS, Neg liver ROS,,,  Endo/Other  diabetes    Renal/GU Renal InsufficiencyRenal disease Bladder dysfunction      Musculoskeletal  (+) Arthritis , Rheumatoid disorders,    Abdominal   Peds  Hematology negative hematology ROS (+)   Anesthesia Other Findings Past Medical History: No date: A-fib (HCC) No date: Arthritis No date: Diabetes mellitus without complication (HCC) No date: Presence of permanent cardiac pacemaker No date: Sick sinus syndrome (HCC) No date: Urinary retention     Comment:  chronic, with indwelling Foley catheter and plans for a               suprapubic No date: Wears dentures     Comment:  full upper  Past Surgical History: 01/16/2017: CATARACT EXTRACTION W/PHACO; Right     Comment:  Procedure: CATARACT EXTRACTION PHACO AND INTRAOCULAR               LENS PLACEMENT ([place])  Right Complicated;  Surgeon:               Leandrew Koyanagi, MD;  Location: [place];               Service: Ophthalmology;  Laterality: Right;  IVA               Block Healon 5 malyugin vision blue Diabetic - oral               meds 09/15/2020: INSERT / REPLACE / REMOVE PACEMAKER     Comment:  Medtronic [id number] 07/05/2018: METATARSAL HEAD EXCISION; Left     Comment:  Procedure: RESECTION FIRST METATARSAL INFECTED BONE AND               SOFT TISSUE;  Surgeon:  Albertine Patricia, [name];  Location:               [facility];  Service: Podiatry;  Laterality: Left; No date: TOE AMPUTATION; Left No date: TONSILLECTOMY  BMI    Body Mass Index: 28.89 kg/m      Reproductive/Obstetrics negative OB ROS                             Anesthesia Physical Anesthesia Plan  ASA: 3  Anesthesia Plan: MAC   Post-op Pain Management:    Induction:   PONV Risk Score and Plan:   Airway Management Planned: Natural Airway  Additional Equipment:   Intra-op Plan:   Post-operative Plan:   Informed Consent: I have reviewed the patients History and Physical, chart, labs and discussed the procedure including the risks, benefits and alternatives for the proposed anesthesia with the patient or authorized representative who has indicated his/her understanding and acceptance.       Plan Discussed with: Anesthesiologist, CRNA and Surgeon  Anesthesia Plan Comments:         Anesthesia Quick  Evaluation

## 2022-10-23 NOTE — H&P (Signed)
[facility]   Primary Care Physician:  Loni Beckwith Neita Goodnight., MD Ophthalmologist: Dr. George Ina  Pre-Procedure History & Physical: HPI:  Tyler [name] is a 77 y.o. male here for cataract surgery.   Past Medical History:  Diagnosis Date   A-fib [facility])    Arthritis    Diabetes mellitus without complication ([place])    Presence of permanent cardiac pacemaker    Sick sinus syndrome (HCC)    Urinary retention    chronic, with indwelling Foley catheter and plans for a suprapubic   Wears dentures    full upper    Past Surgical History:  Procedure Laterality Date   CATARACT EXTRACTION W/PHACO Right 01/16/2017   Procedure: CATARACT EXTRACTION PHACO AND INTRAOCULAR LENS PLACEMENT ([place])  Right Complicated;  [name], MD;  Location: [place];  Service: Ophthalmology;  Laterality: Right;  IVA Block Healon 5 malyugin vision blue Diabetic - oral meds   INSERT / REPLACE / REMOVE PACEMAKER  09/15/2020   Medtronic [id number]   METATARSAL HEAD EXCISION Left 07/05/2018   Procedure: RESECTION FIRST METATARSAL INFECTED BONE AND SOFT TISSUE;  [name], [name];  [name];  Service: Podiatry;  Laterality: Left;   TOE AMPUTATION Left    TONSILLECTOMY      Prior to Admission medications   Medication Sig Start Date End Date Taking? Authorizing Provider  benzonatate (TESSALON PERLES) 100 MG capsule Take 1 capsule (100 mg total) by mouth every 6 (six) hours as needed for cough. 09/20/22 09/20/23 Yes Lavonia Drafts, MD  carvedilol (COREG) 3.125 MG tablet Take 3.125 mg by mouth 2 (two) times daily. 07/30/22  Yes [provider]  furosemide (LASIX) 40 MG tablet Take 40 mg by mouth daily. 04/30/22  Yes [provider]  glipiZIDE (GLUCOTROL) 10 MG tablet Take 10 mg by mouth 2 (two) times daily. 06/20/18  Yes [provider]  lisinopril (ZESTRIL) 20 MG tablet Take 30 mg by mouth daily. 01/10/22  Yes [provider]   metFORMIN (GLUCOPHAGE) 1000 MG tablet Take 1,000 mg by mouth 2 (two) times daily. 06/17/18  Yes [provider]  metolazone (ZAROXOLYN) 2.5 MG tablet Take 2.5 mg by mouth once a week. 05/27/22  Yes [provider]    Allergies as of 08/28/2022   (No Known Allergies)    History reviewed. No pertinent family history.  Social History   Socioeconomic History   Marital status: Single    Spouse name: Not on file   Number of children: Not on file   Years of education: Not on file   Highest education level: Not on file  Occupational History   Not on file  Tobacco Use   Smoking status: Never   Smokeless tobacco: Never  Vaping Use   Vaping Use: Never used  Substance and Sexual Activity   Alcohol use: No   Drug use: Never   Sexual activity: Not Currently    Birth control/protection: None  Other Topics Concern   Not on file  Social History Narrative   Not on file   Social Determinants of Health   Financial Resource Strain: Not on file  Food Insecurity: Not on file  Transportation Needs: Not on file  Physical Activity: Not on file  Stress: Not on file  Social Connections: Not on file  Intimate Partner Violence: Not on file    Review of Systems: See HPI, otherwise negative ROS  Physical Exam: BP (!) 142/71   Temp 98.6 F (37 C) (  Tympanic)   Ht 3' 8.65" (1.134 m)   Wt 115.7 kg   SpO2 98%   BMI 89.98 kg/m  General:   Alert, cooperative in NAD Head:  Normocephalic and atraumatic. Respiratory:  Normal work of breathing. Cardiovascular:  RRR  Impression/Plan: Tyler [name] is here for cataract surgery.  Risks, benefits, limitations, and alternatives regarding cataract surgery have been reviewed with the patient.  Questions have been answered.  All parties agreeable.   Birder Robson, MD  10/23/2022, 1:21 PM

## 2022-10-24 ENCOUNTER — Encounter: Payer: Self-pay | Admitting: Ophthalmology

## 2022-10-30 ENCOUNTER — Emergency Department: Payer: BC Managed Care – PPO

## 2022-10-30 ENCOUNTER — Other Ambulatory Visit: Payer: Self-pay

## 2022-10-30 ENCOUNTER — Inpatient Hospital Stay
Admission: EM | Admit: 2022-10-30 | Discharge: 2022-11-06 | DRG: 617 | Disposition: A | Discharge status: Home / Self Care | Payer: BC Managed Care – PPO | Attending: Osteopathic Medicine | Admitting: Osteopathic Medicine

## 2022-10-30 ENCOUNTER — Encounter: Payer: Self-pay | Admitting: Internal Medicine

## 2022-10-30 DIAGNOSIS — Z89422 Acquired absence of other left toe(s): Secondary | ICD-10-CM

## 2022-10-30 DIAGNOSIS — I10 Essential (primary) hypertension: Secondary | ICD-10-CM | POA: Insufficient documentation

## 2022-10-30 DIAGNOSIS — E1169 Type 2 diabetes mellitus with other specified complication: Secondary | ICD-10-CM | POA: Diagnosis present

## 2022-10-30 DIAGNOSIS — R040 Epistaxis: Secondary | ICD-10-CM | POA: Diagnosis not present

## 2022-10-30 DIAGNOSIS — I743 Embolism and thrombosis of arteries of the lower extremities: Secondary | ICD-10-CM | POA: Diagnosis not present

## 2022-10-30 DIAGNOSIS — I70261 Atherosclerosis of native arteries of extremities with gangrene, right leg: Secondary | ICD-10-CM | POA: Diagnosis present

## 2022-10-30 DIAGNOSIS — I495 Sick sinus syndrome: Secondary | ICD-10-CM | POA: Diagnosis present

## 2022-10-30 DIAGNOSIS — R0981 Nasal congestion: Secondary | ICD-10-CM | POA: Diagnosis not present

## 2022-10-30 DIAGNOSIS — M86671 Other chronic osteomyelitis, right ankle and foot: Secondary | ICD-10-CM | POA: Diagnosis present

## 2022-10-30 DIAGNOSIS — I96 Gangrene, not elsewhere classified: Secondary | ICD-10-CM

## 2022-10-30 DIAGNOSIS — M2041 Other hammer toe(s) (acquired), right foot: Secondary | ICD-10-CM | POA: Diagnosis present

## 2022-10-30 DIAGNOSIS — Z9842 Cataract extraction status, left eye: Secondary | ICD-10-CM

## 2022-10-30 DIAGNOSIS — I739 Peripheral vascular disease, unspecified: Secondary | ICD-10-CM

## 2022-10-30 DIAGNOSIS — Z950 Presence of cardiac pacemaker: Secondary | ICD-10-CM

## 2022-10-30 DIAGNOSIS — L03115 Cellulitis of right lower limb: Secondary | ICD-10-CM | POA: Insufficient documentation

## 2022-10-30 DIAGNOSIS — Z79899 Other long term (current) drug therapy: Secondary | ICD-10-CM

## 2022-10-30 DIAGNOSIS — Z9841 Cataract extraction status, right eye: Secondary | ICD-10-CM

## 2022-10-30 DIAGNOSIS — Z6832 Body mass index (BMI) 32.0-32.9, adult: Secondary | ICD-10-CM

## 2022-10-30 DIAGNOSIS — N179 Acute kidney failure, unspecified: Secondary | ICD-10-CM | POA: Diagnosis not present

## 2022-10-30 DIAGNOSIS — Z961 Presence of intraocular lens: Secondary | ICD-10-CM | POA: Diagnosis present

## 2022-10-30 DIAGNOSIS — R609 Edema, unspecified: Secondary | ICD-10-CM | POA: Diagnosis present

## 2022-10-30 DIAGNOSIS — R059 Cough, unspecified: Secondary | ICD-10-CM | POA: Diagnosis not present

## 2022-10-30 DIAGNOSIS — E1122 Type 2 diabetes mellitus with diabetic chronic kidney disease: Secondary | ICD-10-CM | POA: Diagnosis not present

## 2022-10-30 DIAGNOSIS — N1831 Chronic kidney disease, stage 3a: Secondary | ICD-10-CM | POA: Diagnosis not present

## 2022-10-30 DIAGNOSIS — M19071 Primary osteoarthritis, right ankle and foot: Secondary | ICD-10-CM | POA: Diagnosis not present

## 2022-10-30 DIAGNOSIS — E11628 Type 2 diabetes mellitus with other skin complications: Principal | ICD-10-CM | POA: Diagnosis present

## 2022-10-30 DIAGNOSIS — E118 Type 2 diabetes mellitus with unspecified complications: Secondary | ICD-10-CM | POA: Insufficient documentation

## 2022-10-30 DIAGNOSIS — L089 Local infection of the skin and subcutaneous tissue, unspecified: Secondary | ICD-10-CM | POA: Diagnosis not present

## 2022-10-30 DIAGNOSIS — Z936 Other artificial openings of urinary tract status: Secondary | ICD-10-CM | POA: Diagnosis not present

## 2022-10-30 DIAGNOSIS — Z89421 Acquired absence of other right toe(s): Secondary | ICD-10-CM | POA: Diagnosis not present

## 2022-10-30 DIAGNOSIS — E11621 Type 2 diabetes mellitus with foot ulcer: Secondary | ICD-10-CM | POA: Diagnosis not present

## 2022-10-30 DIAGNOSIS — D631 Anemia in chronic kidney disease: Secondary | ICD-10-CM | POA: Diagnosis present

## 2022-10-30 DIAGNOSIS — Z7984 Long term (current) use of oral hypoglycemic drugs: Secondary | ICD-10-CM

## 2022-10-30 DIAGNOSIS — E669 Obesity, unspecified: Secondary | ICD-10-CM | POA: Insufficient documentation

## 2022-10-30 DIAGNOSIS — R339 Retention of urine, unspecified: Secondary | ICD-10-CM | POA: Diagnosis present

## 2022-10-30 DIAGNOSIS — M1990 Unspecified osteoarthritis, unspecified site: Secondary | ICD-10-CM | POA: Diagnosis present

## 2022-10-30 DIAGNOSIS — M7989 Other specified soft tissue disorders: Secondary | ICD-10-CM | POA: Diagnosis not present

## 2022-10-30 DIAGNOSIS — I4819 Other persistent atrial fibrillation: Secondary | ICD-10-CM | POA: Diagnosis not present

## 2022-10-30 DIAGNOSIS — N183 Chronic kidney disease, stage 3 unspecified: Secondary | ICD-10-CM | POA: Insufficient documentation

## 2022-10-30 DIAGNOSIS — M79671 Pain in right foot: Secondary | ICD-10-CM | POA: Diagnosis present

## 2022-10-30 DIAGNOSIS — I701 Atherosclerosis of renal artery: Secondary | ICD-10-CM | POA: Diagnosis not present

## 2022-10-30 DIAGNOSIS — M869 Osteomyelitis, unspecified: Secondary | ICD-10-CM | POA: Diagnosis not present

## 2022-10-30 DIAGNOSIS — I129 Hypertensive chronic kidney disease with stage 1 through stage 4 chronic kidney disease, or unspecified chronic kidney disease: Secondary | ICD-10-CM | POA: Diagnosis present

## 2022-10-30 DIAGNOSIS — L97519 Non-pressure chronic ulcer of other part of right foot with unspecified severity: Secondary | ICD-10-CM | POA: Diagnosis present

## 2022-10-30 DIAGNOSIS — R600 Localized edema: Secondary | ICD-10-CM | POA: Diagnosis not present

## 2022-10-30 DIAGNOSIS — E1152 Type 2 diabetes mellitus with diabetic peripheral angiopathy with gangrene: Secondary | ICD-10-CM | POA: Diagnosis not present

## 2022-10-30 DIAGNOSIS — I70235 Atherosclerosis of native arteries of right leg with ulceration of other part of foot: Secondary | ICD-10-CM | POA: Diagnosis not present

## 2022-10-30 DIAGNOSIS — Z872 Personal history of diseases of the skin and subcutaneous tissue: Secondary | ICD-10-CM | POA: Diagnosis not present

## 2022-10-30 LAB — BASIC METABOLIC PANEL
Anion gap: 13 (ref 5–15)
BUN: 40 mg/dL — ABNORMAL HIGH (ref 8–23)
CO2: 19 mmol/L — ABNORMAL LOW (ref 22–32)
Calcium: 8.7 mg/dL — ABNORMAL LOW (ref 8.9–10.3)
Chloride: 103 mmol/L (ref 98–111)
Creatinine, Ser: 1.46 mg/dL — ABNORMAL HIGH (ref 0.61–1.24)
GFR, Estimated: 49 mL/min — ABNORMAL LOW (ref >60)
Glucose, Bld: 158 mg/dL — ABNORMAL HIGH (ref 70–99)
Potassium: 4.1 mmol/L (ref 3.5–5.1)
Sodium: 135 mmol/L (ref 135–145)

## 2022-10-30 LAB — LACTIC ACID, PLASMA
Lactic Acid, Venous: 1 mmol/L (ref 0.5–1.9)
Lactic Acid, Venous: 0.8 mmol/L (ref 0.5–1.9)

## 2022-10-30 LAB — CBC
HCT: 33.1 % — ABNORMAL LOW (ref 39.0–52.0)
Hemoglobin: 11.1 g/dL — ABNORMAL LOW (ref 13.0–17.0)
MCH: 31.2 pg (ref 26.0–34.0)
MCHC: 33.5 g/dL (ref 30.0–36.0)
MCV: 93 fL (ref 80.0–100.0)
Platelets: 250 10*3/uL (ref 150–400)
RBC: 3.56 MIL/uL — ABNORMAL LOW (ref 4.22–5.81)
RDW: 12.4 % (ref 11.5–15.5)
WBC: 13.1 10*3/uL — ABNORMAL HIGH (ref 4.0–10.5)
nRBC: 0 % (ref 0.0–0.2)

## 2022-10-30 MED ORDER — ONDANSETRON HCL 4 MG/2ML IJ SOLN: 4.0000 mg | Freq: Four times a day (QID) | INTRAMUSCULAR | Status: DC | PRN

## 2022-10-30 MED ORDER — INSULIN ASPART 100 UNIT/ML IJ SOLN
0.0000 [IU] | Freq: Three times a day (TID) | INTRAMUSCULAR | Status: DC
  Administered 2022-10-31 (×2): 3 [IU] via SUBCUTANEOUS
  Administered 2022-10-31: 5 [IU] via SUBCUTANEOUS
  Administered 2022-11-01: 3 [IU] via SUBCUTANEOUS
  Administered 2022-11-01: 5 [IU] via SUBCUTANEOUS
  Administered 2022-11-02 (×2): 3 [IU] via SUBCUTANEOUS
  Administered 2022-11-03: 8 [IU] via SUBCUTANEOUS
  Administered 2022-11-03 – 2022-11-04 (×4): 3 [IU] via SUBCUTANEOUS
  Administered 2022-11-05: 5 [IU] via SUBCUTANEOUS
  Administered 2022-11-05: 3 [IU] via SUBCUTANEOUS
  Administered 2022-11-05: 5 [IU] via SUBCUTANEOUS
  Administered 2022-11-06: 3 [IU] via SUBCUTANEOUS
  Filled 2022-10-30 (×16): qty 1

## 2022-10-30 MED ORDER — ONDANSETRON HCL 4 MG PO TABS: 4.0000 mg | ORAL_TABLET | Freq: Four times a day (QID) | ORAL | Status: DC | PRN

## 2022-10-30 MED ORDER — ACETAMINOPHEN 325 MG PO TABS
650.0000 mg | ORAL_TABLET | Freq: Four times a day (QID) | ORAL | Status: DC | PRN
  Administered 2022-10-31 – 2022-11-05 (×8): 650 mg via ORAL
  Filled 2022-10-30 (×8): qty 2

## 2022-10-30 MED ORDER — PIPERACILLIN-TAZOBACTAM 3.375 G IVPB 30 MIN
3.3750 g | Freq: Once | INTRAVENOUS | Status: AC
  Administered 2022-10-30: 3.375 g via INTRAVENOUS
  Filled 2022-10-30: qty 50

## 2022-10-30 MED ORDER — ACETAMINOPHEN 650 MG RE SUPP: 650.0000 mg | Freq: Four times a day (QID) | RECTAL | Status: DC | PRN

## 2022-10-30 MED ORDER — VANCOMYCIN HCL 2000 MG/400ML IV SOLN
2000.0000 mg | Freq: Once | INTRAVENOUS | Status: AC
  Administered 2022-10-30: 2000 mg via INTRAVENOUS
  Filled 2022-10-30: qty 400

## 2022-10-30 NOTE — ED Provider Notes (Signed)
[facility] Provider Note    Event Date/Time   First MD Initiated Contact with Patient 10/30/22 1949     (approximate)   History   Foot Pain   HPI  Tyler [name] is a 77 y.o. male past medical history of past medical history of diabetes A-fib who presents with a foot infection.  Patient noticed the right third toe getting darker several days ago.  Does have pain in the area denies fevers or chills.  Says his diabetes has not caused him issues in several years.  Does not go to podiatrist regularly.    Past Medical History:  Diagnosis Date   A-fib [facility])    Arthritis    Diabetes mellitus without complication (HCC)    Presence of permanent cardiac pacemaker    Sick sinus syndrome (HCC)    Urinary retention    chronic, with indwelling Foley catheter and plans for a suprapubic   Wears dentures    full upper    Patient Active Problem List   Diagnosis Date Noted   Syncope and collapse 06/28/2020   Acute CHF (congestive heart failure) ([place]) 06/28/2020   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   Diabetes mellitus without complication ([place])    Sepsis ([place]) 07/03/2018     Physical Exam  Triage Vital Signs: ED Triage Vitals  Enc Vitals Group     BP 10/30/22 1732 (!) 167/67     Pulse Rate 10/30/22 1732 63     Resp 10/30/22 1732 19     Temp 10/30/22 1732 98.5 F (36.9 C)     Temp src --      SpO2 10/30/22 1732 98 %     Weight --      Height --      Head Circumference --      Peak Flow --      Pain Score 10/30/22 1731 10     Pain Loc --      Pain Edu? --      Excl. in [place]? --     Most recent vital signs: Vitals:   10/30/22 1732  BP: (!) 167/67  Pulse: 63  Resp: 19  Temp: 98.5 F (36.9 C)  SpO2: 98%     General: Awake, no distress.  CV:  Good peripheral perfusion.  Resp:  Normal effort.  Abd:  No distention.  Neuro:             Awake, Alert, Oriented x 3  Other:  Erythema extending from the toes to the mid calf with edema of the  right calf, the third toe is necrotic with foul odor      ED Results / Procedures / Treatments  Labs (all labs ordered are listed, but only abnormal results are displayed) Labs Reviewed  CBC - Abnormal; Notable for the following components:      Result Value   WBC 13.1 (*)    RBC 3.56 (*)    Hemoglobin 11.1 (*)    HCT 33.1 (*)    All other components within normal limits  BASIC METABOLIC PANEL - Abnormal; Notable for the following components:   CO2 19 (*)    Glucose, Bld 158 (*)    BUN 40 (*)    Creatinine, Ser 1.46 (*)    Calcium 8.7 (*)    GFR, Estimated 49 (*)    All other components within normal limits  CULTURE, BLOOD (ROUTINE X 2)  CULTURE, BLOOD (ROUTINE X 2)  LACTIC ACID, PLASMA  LACTIC ACID, PLASMA     EKG     RADIOLOGY \   PROCEDURES:  Critical Care performed: No  Procedures  The patient is on the cardiac monitor to evaluate for evidence of arrhythmia and/or significant heart rate changes.   MEDICATIONS ORDERED IN ED: Medications  vancomycin (VANCOREADY) IVPB 2000 mg/400 mL (has no administration in time range)  piperacillin-tazobactam (ZOSYN) IVPB 3.375 g (has no administration in time range)     IMPRESSION / MDM / ASSESSMENT AND PLAN / ED COURSE  I reviewed the triage vital signs and the nursing notes.                              Patient's presentation is most consistent with acute presentation with potential threat to life or bodily function.  Differential diagnosis includes, but is not limited to, diabetic foot infection, osteomyelitis, necrotizing infection, sepsis  Patient is a 77 year old male who presents with a foot infection.  Going on for several days does not have any other systemic symptoms.  On exam the third toe is clearly necrotic and there is erythema extending to the calf.  He has a leukocytosis normal lactate.  Vital signs are not suggestive of sepsis.  Concern for underlying osteomyelitis and likely he will need  amputation.  Will start Vanco and Zosyn.  Will get blood cultures.  Plan to discussed with the hospitalist      FINAL CLINICAL IMPRESSION(S) / ED DIAGNOSES   Final diagnoses:  Foot infection     Rx / DC Orders   ED Discharge Orders     None        Note:  This document was prepared using Dragon voice recognition software and may include unintentional dictation errors.   Rada Hay, MD 10/30/22 2038

## 2022-10-30 NOTE — H&P (Incomplete)
History and Physical    Patient: Tyler [name] DOB: 06/09/1946 DOA: 10/30/2022 DOS: the patient was seen and examined on 10/31/2022 PCP: Rhetta Mura., MD  Patient coming from: Home  Chief Complaint:  Chief Complaint  Patient presents with   Foot Pain   HPI: Tyler [name] is a 77 y.o. male with medical history significant of type 2 diabetes mellitus, atrial fibrillation who presents to the emergency department due to infection of right third toe.  He states that he noted his right third toe turning dark few days ago and complained of pain in the affected area but denies fever, chills, chest pain, shortness of breath, headache.  Patient has not seen any podiatrist since onset of symptoms and usually does not follow-up with any podiatrist.  ED Course:  In the emergency department, BP was 167/67, but other vital signs were within normal range.  Workup in the ED showed leukocytosis and normocytic anemia.  BMP showed sodium 134, potassium 4.1, chloride 103, bicarb 19, blood glucose 158, BUN/creatinine 40/1.46 (baseline creatinine at 1.0-1.3).  Lactic acid x 2 was negative. Right foot x-ray showed soft tissue edema overlies the dorsum of the foot. No soft tissue gas or radiopaque foreign body. No radiographic findings of osteomyelitis. Distorted third toe alignment which limits assessment. Hammertoe deformity of the second and fourth toes. Patient was started on IV vancomycin and Zosyn.  Hospitalist was asked to admit patient for further evaluation and management.  Review of Systems: Review of systems as noted in the HPI. All other systems reviewed and are negative.   Past Medical History:  Diagnosis Date   A-fib [facility])    Arthritis    Diabetes mellitus without complication ([place])    Presence of permanent cardiac pacemaker    Sick sinus syndrome (HCC)    Urinary retention    chronic, with indwelling Foley catheter and plans for a suprapubic   Wears dentures     full upper   Past Surgical History:  Procedure Laterality Date   CATARACT EXTRACTION W/PHACO Right 01/16/2017   Procedure: CATARACT EXTRACTION PHACO AND INTRAOCULAR LENS PLACEMENT ([place])  Right Complicated;  [name], MD;  Location: [place];  Service: Ophthalmology;  Laterality: Right;  IVA Block Healon 5 malyugin vision blue Diabetic - oral meds   CATARACT EXTRACTION W/PHACO Left 10/23/2022   Procedure: CATARACT EXTRACTION PHACO AND INTRAOCULAR LENS PLACEMENT ([place]) LEFT DIABETIC;  [name], MD;  Location: [place];  Service: Ophthalmology;  Laterality: Left;  Diabetic   INSERT / REPLACE / REMOVE PACEMAKER  09/15/2020   Medtronic [id number]   METATARSAL HEAD EXCISION Left 07/05/2018   Procedure: RESECTION FIRST METATARSAL INFECTED BONE AND SOFT TISSUE;  [name], [name];  [name];  Service: Podiatry;  Laterality: Left;   TOE AMPUTATION Left    TONSILLECTOMY      Social History:  reports that he has never smoked. He has never used smokeless tobacco. He reports that he does not drink alcohol and does not use drugs.   No Known Allergies  Family history: No pertinent family history.   Prior to Admission medications   Medication Sig Start Date End Date Taking? Authorizing Provider  benzonatate (TESSALON PERLES) 100 MG capsule Take 1 capsule (100 mg total) by mouth every 6 (six) hours as needed for cough. 09/20/22 09/20/23  Lavonia Drafts, MD  carvedilol (COREG) 3.125 MG tablet Take 3.125 mg by mouth 2 (two) times daily. 07/30/22  [provider]  furosemide (LASIX) 40 MG tablet Take 40 mg by mouth daily. 04/30/22   [provider]  glipiZIDE (GLUCOTROL) 10 MG tablet Take 10 mg by mouth 2 (two) times daily. 06/20/18   [provider]  lisinopril (ZESTRIL) 20 MG tablet Take 30 mg by mouth daily. 01/10/22   [provider]  metFORMIN (GLUCOPHAGE) 1000 MG tablet Take 1,000 mg by  mouth 2 (two) times daily. 06/17/18   [provider]  metolazone (ZAROXOLYN) 2.5 MG tablet Take 2.5 mg by mouth once a week. 05/27/22   [provider]    Physical Exam: BP (!) 157/87 (BP Location: Left Arm)   Pulse (!) 58   Temp 98.1 F (36.7 C) (Oral)   Resp 20   SpO2 100%   General: 77 y.o. year-old male well developed well nourished in no acute distress.  Alert and oriented x3. HEENT: NCAT, EOMI Neck: Supple, trachea medial Cardiovascular: Regular rate and rhythm with no rubs or gallops.  No thyromegaly or JVD noted.  +2 lower extremity edema. 2/4 pulses in all 4 extremities. Respiratory: Clear to auscultation with no wheezes or rales. Good inspiratory effort. Abdomen: Soft, nontender nondistended with normal bowel sounds x4 quadrants. Muskuloskeletal: 3rd toe was necrotic with foul odor, superimposed erythema around the wound noted.  Neuro: CN II-XII intact, sensation, reflexes intact Skin: Necrotic 3rd toe wound noted Psychiatry: Judgement and insight appear normal. Mood is appropriate for condition and setting          Labs on Admission:  Basic Metabolic Panel: Recent Labs  Lab 10/30/22 1742  NA 135  K 4.1  CL 103  CO2 19*  GLUCOSE 158*  BUN 40*  CREATININE 1.46*  CALCIUM 8.7*   Liver Function Tests: No results for input(s): "AST", "ALT", "ALKPHOS", "BILITOT", "PROT", "ALBUMIN" in the last 168 hours. No results for input(s): "LIPASE", "AMYLASE" in the last 168 hours. No results for input(s): "AMMONIA" in the last 168 hours. CBC: Recent Labs  Lab 10/30/22 1742  WBC 13.1*  HGB 11.1*  HCT 33.1*  MCV 93.0  PLT 250   Cardiac Enzymes: No results for input(s): "CKTOTAL", "CKMB", "CKMBINDEX", "TROPONINI" in the last 168 hours.  BNP (last 3 results) No results for input(s): "BNP" in the last 8760 hours.  ProBNP (last 3 results) No results for input(s): "PROBNP" in the last 8760 hours.  CBG: Recent Labs  Lab 10/31/22 0044  GLUCAP 135*     Radiological Exams on Admission: DG Foot Complete Right  Result Date: 10/30/2022 CLINICAL DATA:  Foot pain, swelling and fever. Concern for osteomyelitis. EXAM: RIGHT FOOT COMPLETE - 3+ VIEW COMPARISON:  No prior right foot exams. FINDINGS: The third toe alignment is distorted which limits assessment. Hammertoe deformity of the second and fourth toes. Osteoarthritis of the first metatarsal phalangeal joint. No fracture. No periostitis or frank erosive change. Vascular calcifications are seen. Soft tissue edema overlies the dorsum of the foot. No soft tissue gas or radiopaque foreign body. IMPRESSION: 1. Soft tissue edema overlies the dorsum of the foot. No soft tissue gas or radiopaque foreign body. 2. No radiographic findings of osteomyelitis. Distorted third toe alignment which limits assessment. 3. Hammertoe deformity of the second and fourth toes. Electronically Signed   By: Keith Rake M.D.   On: 10/30/2022 20:56    EKG: I independently viewed the EKG done and my findings are as followed: EKG was not done in the ED  Assessment/Plan Present on Admission:  Right foot  infection  Principal Problem:   Right foot infection Active Problems:   Cellulitis of right foot   Type 2 diabetes mellitus with complication, without long-term current use of insulin (HCC)   Persistent atrial fibrillation (HCC)   Essential hypertension   CKD (chronic kidney disease), stage III (HCC)   Obesity (BMI 30-39.9)  Right foot infection with superimposed cellulitis Patient was started on IV vancomycin and Zosyn, we shall continue vancomycin and cefepime Secure chat message sent to podiatrist (Dr. Amalia Hailey) for consult in the morning  Type 2 diabetes mellitus Continue ISS and hypoglycemia protocol Glipizide and metformin will be held at this time  Persistent atrial fibrillation Continue Coreg Patient was not on any anticoagulant per med rec  Essential hypertension (uncontrolled) Continue lisinopril and  Coreg  CKD stage 3A BUN/creatinine 40/1.46 (baseline creatinine at 1.0-1.3) Renally adjust medications, avoid nephrotoxic agents/dehydration/hypotension  Obesity (BMI 32.55) Diet and lifestyle modification  DVT prophylaxis: SCDs  Code Status: Full code  Family Communication: None at bedside  Consults: Podiatry (Dr. Amalia Hailey)  Severity of Illness: The appropriate patient status for this patient is INPATIENT. Inpatient status is judged to be reasonable and necessary in order to provide the required intensity of service to ensure the patient's safety. [name] presenting symptoms, physical exam findings, and initial radiographic and laboratory data in the context of their chronic comorbidities is felt to place them at high risk for further clinical deterioration. Furthermore, it is not anticipated that the patient will be medically stable for discharge from the hospital within 2 midnights of admission.   * I certify that at the point of admission it is my clinical judgment that the patient will require inpatient hospital care spanning beyond 2 midnights from the point of admission due to high intensity of service, high risk for further deterioration and high frequency of surveillance required.*  Author: Bernadette Hoit, DO 10/31/2022 1:17 AM  For on call review [email or web address].

## 2022-10-30 NOTE — ED Triage Notes (Signed)
Pt comes with c/o foot pain, swelling and fever. Pt states this all started few days ago. Pt states fever started today. Pt is diabetic.   Pt states leg swelling and been back on fluid pill for about week.

## 2022-10-31 ENCOUNTER — Inpatient Hospital Stay: Payer: BC Managed Care – PPO

## 2022-10-31 ENCOUNTER — Encounter: Payer: Self-pay | Admitting: Internal Medicine

## 2022-10-31 DIAGNOSIS — L03115 Cellulitis of right lower limb: Secondary | ICD-10-CM | POA: Insufficient documentation

## 2022-10-31 DIAGNOSIS — I739 Peripheral vascular disease, unspecified: Secondary | ICD-10-CM

## 2022-10-31 DIAGNOSIS — E118 Type 2 diabetes mellitus with unspecified complications: Secondary | ICD-10-CM | POA: Insufficient documentation

## 2022-10-31 DIAGNOSIS — I4819 Other persistent atrial fibrillation: Secondary | ICD-10-CM | POA: Insufficient documentation

## 2022-10-31 DIAGNOSIS — L089 Local infection of the skin and subcutaneous tissue, unspecified: Secondary | ICD-10-CM

## 2022-10-31 DIAGNOSIS — I96 Gangrene, not elsewhere classified: Secondary | ICD-10-CM | POA: Diagnosis not present

## 2022-10-31 DIAGNOSIS — N183 Chronic kidney disease, stage 3 unspecified: Secondary | ICD-10-CM | POA: Insufficient documentation

## 2022-10-31 DIAGNOSIS — E669 Obesity, unspecified: Secondary | ICD-10-CM | POA: Insufficient documentation

## 2022-10-31 DIAGNOSIS — I10 Essential (primary) hypertension: Secondary | ICD-10-CM | POA: Insufficient documentation

## 2022-10-31 LAB — GLUCOSE, CAPILLARY
Glucose-Capillary: 135 mg/dL — ABNORMAL HIGH (ref 70–99)
Glucose-Capillary: 155 mg/dL — ABNORMAL HIGH (ref 70–99)
Glucose-Capillary: 157 mg/dL — ABNORMAL HIGH (ref 70–99)
Glucose-Capillary: 209 mg/dL — ABNORMAL HIGH (ref 70–99)
Glucose-Capillary: 226 mg/dL — ABNORMAL HIGH (ref 70–99)

## 2022-10-31 LAB — CBC
HCT: 31.7 % — ABNORMAL LOW (ref 39.0–52.0)
Hemoglobin: 10.6 g/dL — ABNORMAL LOW (ref 13.0–17.0)
MCH: 31.3 pg (ref 26.0–34.0)
MCHC: 33.4 g/dL (ref 30.0–36.0)
MCV: 93.5 fL (ref 80.0–100.0)
Platelets: 240 10*3/uL (ref 150–400)
RBC: 3.39 MIL/uL — ABNORMAL LOW (ref 4.22–5.81)
RDW: 12.4 % (ref 11.5–15.5)
WBC: 11.8 10*3/uL — ABNORMAL HIGH (ref 4.0–10.5)
nRBC: 0 % (ref 0.0–0.2)

## 2022-10-31 LAB — COMPREHENSIVE METABOLIC PANEL
ALT: 22 U/L (ref 0–44)
AST: 15 U/L (ref 15–41)
Albumin: 2.7 g/dL — ABNORMAL LOW (ref 3.5–5.0)
Alkaline Phosphatase: 82 U/L (ref 38–126)
Anion gap: 8 (ref 5–15)
BUN: 36 mg/dL — ABNORMAL HIGH (ref 8–23)
CO2: 21 mmol/L — ABNORMAL LOW (ref 22–32)
Calcium: 8.5 mg/dL — ABNORMAL LOW (ref 8.9–10.3)
Chloride: 107 mmol/L (ref 98–111)
Creatinine, Ser: 1.18 mg/dL (ref 0.61–1.24)
GFR, Estimated: >60 mL/min (ref >60)
Glucose, Bld: 153 mg/dL — ABNORMAL HIGH (ref 70–99)
Potassium: 3.7 mmol/L (ref 3.5–5.1)
Sodium: 136 mmol/L (ref 135–145)
Total Bilirubin: 0.9 mg/dL (ref 0.3–1.2)
Total Protein: 6.8 g/dL (ref 6.5–8.1)

## 2022-10-31 LAB — PHOSPHORUS: Phosphorus: 3.6 mg/dL (ref 2.5–4.6)

## 2022-10-31 LAB — HEMOGLOBIN A1C
Hgb A1c MFr Bld: 6.4 % — ABNORMAL HIGH (ref 4.8–5.6)
Mean Plasma Glucose: 136.98 mg/dL

## 2022-10-31 LAB — MAGNESIUM: Magnesium: 2 mg/dL (ref 1.7–2.4)

## 2022-10-31 MED ORDER — HEPARIN SODIUM (PORCINE) 5000 UNIT/ML IJ SOLN
5000.0000 [IU] | Freq: Three times a day (TID) | INTRAMUSCULAR | Status: DC
  Administered 2022-10-31 – 2022-11-04 (×12): 5000 [IU] via SUBCUTANEOUS
  Filled 2022-10-31 (×14): qty 1

## 2022-10-31 MED ORDER — GUAIFENESIN-DM 100-10 MG/5ML PO SYRP
5.0000 mL | ORAL_SOLUTION | ORAL | Status: DC | PRN
  Administered 2022-10-31 – 2022-11-03 (×6): 5 mL via ORAL
  Filled 2022-10-31 (×6): qty 10

## 2022-10-31 MED ORDER — VANCOMYCIN HCL 1500 MG/300ML IV SOLN
1500.0000 mg | INTRAVENOUS | Status: DC
  Administered 2022-10-31 – 2022-11-05 (×6): 1500 mg via INTRAVENOUS
  Filled 2022-10-31 (×7): qty 300

## 2022-10-31 MED ORDER — LISINOPRIL 20 MG PO TABS
30.0000 mg | ORAL_TABLET | Freq: Every day | ORAL | Status: DC
  Administered 2022-10-31 – 2022-11-02 (×3): 30 mg via ORAL
  Filled 2022-10-31 (×3): qty 1

## 2022-10-31 MED ORDER — CARVEDILOL 6.25 MG PO TABS
3.1250 mg | ORAL_TABLET | Freq: Two times a day (BID) | ORAL | Status: DC
  Administered 2022-10-31 – 2022-11-03 (×7): 3.125 mg via ORAL
  Filled 2022-10-31 (×7): qty 1

## 2022-10-31 MED ORDER — SODIUM CHLORIDE 0.9 % IV SOLN
2.0000 g | Freq: Three times a day (TID) | INTRAVENOUS | Status: DC
  Administered 2022-10-31 – 2022-11-06 (×18): 2 g via INTRAVENOUS
  Filled 2022-10-31 (×5): qty 2
  Filled 2022-10-31: qty 12.5
  Filled 2022-10-31 (×2): qty 2
  Filled 2022-10-31: qty 12.5
  Filled 2022-10-31: qty 2
  Filled 2022-10-31: qty 12.5
  Filled 2022-10-31: qty 2
  Filled 2022-10-31: qty 12.5
  Filled 2022-10-31: qty 2
  Filled 2022-10-31: qty 12.5
  Filled 2022-10-31 (×2): qty 2
  Filled 2022-10-31: qty 12.5
  Filled 2022-10-31: qty 2
  Filled 2022-10-31: qty 12.5
  Filled 2022-10-31: qty 2

## 2022-10-31 MED ORDER — VANCOMYCIN HCL 1750 MG/350ML IV SOLN: 1750.0000 mg | INTRAVENOUS | Status: DC

## 2022-10-31 MED ORDER — CHLORHEXIDINE GLUCONATE CLOTH 2 % EX PADS
6.0000 | MEDICATED_PAD | Freq: Every day | CUTANEOUS | Status: DC
  Administered 2022-11-01 – 2022-11-02 (×2): 6 via TOPICAL

## 2022-10-31 MED ORDER — SODIUM CHLORIDE 0.9 % IV SOLN
2.0000 g | Freq: Two times a day (BID) | INTRAVENOUS | Status: DC
  Administered 2022-10-31: 2 g via INTRAVENOUS
  Filled 2022-10-31: qty 2

## 2022-10-31 MED ORDER — VANCOMYCIN HCL IN DEXTROSE 1-5 GM/200ML-% IV SOLN: 1000.0000 mg | Freq: Once | INTRAVENOUS | Status: DC

## 2022-10-31 MED ORDER — SODIUM CHLORIDE 0.9 % IV SOLN: 2.0000 g | Freq: Once | INTRAVENOUS | Status: DC

## 2022-10-31 MED ORDER — FUROSEMIDE 40 MG PO TABS
40.0000 mg | ORAL_TABLET | Freq: Every day | ORAL | Status: DC
  Administered 2022-10-31 – 2022-11-06 (×6): 40 mg via ORAL
  Filled 2022-10-31 (×7): qty 1

## 2022-10-31 NOTE — Consult Note (Signed)
Hospital Consult    Reason for Consult:  Infected right foot with Gangrene to 3rd toe.  Requesting Physician:  Dr Emeterio Reeve MD. MRN #:  [id number]  History of Present Illness: This is a 77 y.o. male  with medical history significant of type 2 diabetes mellitus, atrial fibrillation who presents to the emergency department 10/30/22 due to infection of right third toe.  He states that he noted his right third toe turning dark few days ago and complained of pain in the affected area as well as fever.   On 10/30/22 Right foot x-ray showed soft tissue edema overlies the dorsum of the foot. No soft tissue gas or radiopaque foreign body. No radiographic findings of osteomyelitis. Started on IV vancomycin and Zosyn. Admitted to hospitalist service.  ON 10/31/22 Vascular surgery consulted to evaluate right foot. Noted obvious gangrene to right 3rd toe. Entire foot an up to mid-calf appears to be cellulitic.   Past Medical History:  Diagnosis Date   A-fib [facility])    Arthritis    Diabetes mellitus without complication ([place])    Presence of permanent cardiac pacemaker    Sick sinus syndrome (HCC)    Urinary retention    chronic, with indwelling Foley catheter and plans for a suprapubic   Wears dentures    full upper    Past Surgical History:  Procedure Laterality Date   CATARACT EXTRACTION W/PHACO Right 01/16/2017   Procedure: CATARACT EXTRACTION PHACO AND INTRAOCULAR LENS PLACEMENT ([place])  Right Complicated;  [name], MD;  Location: [place];  Service: Ophthalmology;  Laterality: Right;  IVA Block Healon 5 malyugin vision blue Diabetic - oral meds   CATARACT EXTRACTION W/PHACO Left 10/23/2022   Procedure: CATARACT EXTRACTION PHACO AND INTRAOCULAR LENS PLACEMENT ([place]) LEFT DIABETIC;  [name], MD;  Location: [place];  Service: Ophthalmology;  Laterality: Left;  Diabetic   INSERT / REPLACE / REMOVE PACEMAKER  09/15/2020   Medtronic  [id number]   METATARSAL HEAD EXCISION Left 07/05/2018   Procedure: RESECTION FIRST METATARSAL INFECTED BONE AND SOFT TISSUE;  [name], [name];  [name];  Service: Podiatry;  Laterality: Left;   TOE AMPUTATION Left    TONSILLECTOMY      No Known Allergies  Prior to Admission medications   Medication Sig Start Date End Date Taking? Authorizing Provider  carvedilol (COREG) 3.125 MG tablet Take 3.125 mg by mouth 2 (two) times daily. 07/30/22  Yes [provider]  furosemide (LASIX) 40 MG tablet Take 40 mg by mouth daily. 04/30/22  Yes [provider]  glipiZIDE (GLUCOTROL) 10 MG tablet Take 10 mg by mouth 2 (two) times daily. 06/20/18  Yes [provider]  lisinopril (ZESTRIL) 20 MG tablet Take 30 mg by mouth daily. 01/10/22  Yes [provider]  metFORMIN (GLUCOPHAGE) 1000 MG tablet Take 1,000 mg by mouth 2 (two) times daily. 06/17/18  Yes [provider]  metolazone (ZAROXOLYN) 2.5 MG tablet Take 2.5 mg by mouth once a week. 05/27/22  Yes [provider]  benzonatate (TESSALON PERLES) 100 MG capsule Take 1 capsule (100 mg total) by mouth every 6 (six) hours as needed for cough. 09/20/22 09/20/23  Lavonia Drafts, MD    Social History   Socioeconomic History   Marital status: Single    Spouse name: Not on file   Number of children: Not on file   Years of education: Not on file   Highest education level: Not on file  Occupational  History   Not on file  Tobacco Use   Smoking status: Never   Smokeless tobacco: Never  Vaping Use   Vaping Use: Never used  Substance and Sexual Activity   Alcohol use: No   Drug use: Never   Sexual activity: Not Currently    Birth control/protection: None  Other Topics Concern   Not on file  Social History Narrative   Not on file   Social Determinants of Health   Financial Resource Strain: Not on file  Food Insecurity: No Food Insecurity (10/30/2022)   Hunger Vital Sign    Worried  About Running Out of Food in the Last Year: Never true    Ran Out of Food in the Last Year: Never true  Transportation Needs: No Transportation Needs (10/30/2022)   PRAPARE - Hydrologist (Medical): No    Lack of Transportation (Non-Medical): No  Physical Activity: Not on file  Stress: Not on file  Social Connections: Not on file  Intimate Partner Violence: Not At Risk (10/30/2022)   Humiliation, Afraid, Rape, and Kick questionnaire    Fear of Current or Ex-Partner: No    Emotionally Abused: No    Physically Abused: No    Sexually Abused: No     No family history on file.  ROS: Otherwise negative unless mentioned in HPI  Physical Examination  Vitals:   10/31/22 0821 10/31/22 1550  BP: (!) 154/76 (!) 163/78  Pulse: 61 76  Resp: 18 18  Temp: 99.6 F (37.6 C) 97.6 F (36.4 C)  SpO2: 97% 100%   There is no height or weight on file to calculate BMI.  General:  WDWN in NAD Gait: Not observed HENT: WNL, normocephalic Pulmonary: normal non-labored breathing, without Rales, rhonchi,  wheezing Cardiac: Irregular AFIB, without  Murmurs, rubs or gallops; without carotid bruits Abdomen: Positive Bowel sounds, soft, NT/ND, no masses Skin: with rashes Vascular Exam/Pulses: Right foot =4 edema extends up to mid calf. Unable to palpate pulses DP/PT due to edema.  Extremities: with ischemic changes, with Gangrene , with cellulitis; without open wounds;  Musculoskeletal: no muscle wasting or atrophy  Neurologic: A&O X 3;  No focal weakness or paresthesias are detected; speech is fluent/normal Psychiatric:  The pt has Normal affect. Lymph:  Unremarkable  CBC    Component Value Date/Time   WBC 11.8 (H) 10/31/2022 0402   RBC 3.39 (L) 10/31/2022 0402   HGB 10.6 (L) 10/31/2022 0402   HGB 11.1 (L) 07/09/2014 0452   HCT 31.7 (L) 10/31/2022 0402   HCT 34.3 (L) 07/09/2014 0452   PLT 240 10/31/2022 0402   PLT 358 07/09/2014 0452   MCV 93.5 10/31/2022 0402    MCV 90 07/09/2014 0452   MCH 31.3 10/31/2022 0402   MCHC 33.4 10/31/2022 0402   RDW 12.4 10/31/2022 0402   RDW 12.4 07/09/2014 0452   LYMPHSABS 1.0 09/20/2022 0109   LYMPHSABS 1.5 07/09/2014 0452   MONOABS 0.8 09/20/2022 0109   MONOABS 0.7 07/09/2014 0452   EOSABS 0.2 09/20/2022 0109   EOSABS 0.3 07/09/2014 0452   BASOSABS 0.0 09/20/2022 0109   BASOSABS 0.1 07/09/2014 0452    BMET    Component Value Date/Time   NA 136 10/31/2022 0402   NA 142 07/10/2014 0549   K 3.7 10/31/2022 0402   K 3.7 07/10/2014 0549   CL 107 10/31/2022 0402   CL 109 (H) 07/10/2014 0549   CO2 21 (L) 10/31/2022 0402   CO2 24 07/10/2014  0549   GLUCOSE 153 (H) 10/31/2022 0402   GLUCOSE 134 (H) 07/10/2014 0549   BUN 36 (H) 10/31/2022 0402   BUN 10 07/10/2014 0549   CREATININE 1.18 10/31/2022 0402   CREATININE 1.26 07/10/2014 0549   CALCIUM 8.5 (L) 10/31/2022 0402   CALCIUM 8.3 (L) 07/10/2014 0549   GFRNONAA >60 10/31/2022 0402   GFRNONAA >60 07/10/2014 0549   GFRAA >60 07/07/2018 0440   GFRAA >60 07/10/2014 0549    COAGS: Lab Results  Component Value Date   INR 1.27 07/04/2018   INR 1.1 07/05/2014     Non-Invasive Vascular Imaging:   EXAM: RIGHT FOOT COMPLETE - 3+ VIEW  IMPRESSION: 1. Soft tissue edema overlies the dorsum of the foot. No soft tissue gas or radiopaque foreign body. 2. No radiographic findings of osteomyelitis. Distorted third toe alignment which limits assessment. 3. Hammertoe deformity of the second and fourth toes.   Statin:  No. Beta Blocker:  Yes.   Aspirin:  No. ACEI:  Yes.   ARB:  No. CCB use:  No Other antiplatelets/anticoagulants:  No.    ASSESSMENT/PLAN: This is a 77 y.o. male with an infected right foot at and gangrenous third toe.  PLAN:   Patient is scheduled for a right lower extremity angiogram with possible intervention tomorrow 11/01/2022.  I discussed the procedure, benefits, risks, and complications with the patient.  He verbalizes  understanding.  Answered all his questions today.  He would like to proceed with the procedure as soon as possible.  Patient will be made n.p.o. after midnight.   -Plan was discussed with Dr. Leotis Pain MD and he is in agreement with the plan   Drema Pry [facility] 10/31/2022 3:52 PM

## 2022-10-31 NOTE — Hospital Course (Addendum)
Tyler [name] is a 77 y.o. male with medical history significant of type 2 diabetes mellitus, atrial fibrillation who presents to the emergency department 10/30/22 due to infection of right third toe.  He states that he noted his right third toe turning dark few days ago and complained of pain in the affected area as well as fever.  02/06: Right foot x-ray showed soft tissue edema overlies the dorsum of the foot. No soft tissue gas or radiopaque foreign body. No radiographic findings of osteomyelitis. Started on IV vancomycin and Zosyn. Admitted to hospitalist service. Podiatry to see tomorrow per H&P.  02/07: ABI abn, vascular planning angio Thu 02/08 or Fri 02/09, podiatry planning amputation toe Sat/Sun 02/08: RLE angiography tomorrow 02/09: RLE angiography today, see below. Podiatry plans at least toe amputation this weekend (today is Friday) 02/10: stable, amputation planned tomorrow 02/11: post amputation R 3rd toe, stable, podiatry recs keep at least until tomorrow to monitor surgical wound. Metatarsal appeared clear of infection.  02/12: OK to discharge from podiatry standpoint. Yesterday and overnight significant epistaxis L nare requiring insertion rhino rocket packing overnight, holding heparin/ASA, removed this around noon today, no bleeding at that time. He reports nasal congestion, no coughing or SOB, he did cough up substantial clot this morning but nothing since then. 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      ASSESSMENT & PLAN:   Principal  Problem:   Right foot infection Active Problems:   Cellulitis of right foot   Type 2 diabetes mellitus with complication, without long-term current use of insulin (HCC)   Persistent atrial fibrillation (HCC)   Essential hypertension   CKD (chronic kidney disease), stage III (HCC)   Obesity (BMI 30-39.9)   Gangrene of toe of right foot ([place])   PAD (peripheral artery disease) ([place])   Foot infection   Epistaxis  Right foot infection with superimposed cellulitis PVD S/p angiography/angioplasty RLE 11/02/22  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  7-10 days po abx per podiatry, d/c on doxy Follow outpatient w/ podiatry as directed    Epistaxis ENT if needed  SOB/congestion CXR   Type 2 diabetes mellitus Continue ISS and hypoglycemia protocol Glipizide and metformin held at this time   Persistent atrial fibrillation Continue Coreg - increased dose d/t HTN Patient was not on any anticoagulant PTA per med rec   Essential hypertension (uncontrolled) Continue lisinopril and Coreg - increased doses d/t HTN    CKD stage 3A AKI - resolved Restarted home dose lasix po  Monitor BMP   Obesity (BMI 32.55) Diet and lifestyle modification     DVT prophylaxis: SCD Pertinent IV fluids/nutrition: no continuous IV fluids  Central lines / invasive devices: none  Code Status: FULL CODE   Current Admission Status: inpatient  TOC needs / Dispo plan: none at this time, pend PT/OT eval after toe amputation, expect he may benefit from [facility] PT or outpatient PT Barriers to discharge / significant pending items: podiatry and vascular recs, IV abx for now, toe amputation today and hopefully discharge next 1-2 days per podiatry recs, confirm  if will need continued IV abx or can transition to po or d/c abx

## 2022-10-31 NOTE — Plan of Care (Signed)
  Problem: Education: Goal: Ability to describe self-care measures that may prevent or decrease complications (Diabetes Survival Skills Education) will improve Outcome: Progressing   Problem: Coping: Goal: Ability to adjust to condition or change in health will improve Outcome: Progressing   Problem: Metabolic: Goal: Ability to maintain appropriate glucose levels will improve Outcome: Progressing   Problem: Nutritional: Goal: Maintenance of adequate nutrition will improve Outcome: Progressing   Problem: Skin Integrity: Goal: Risk for impaired skin integrity will decrease Outcome: Progressing   Problem: Health Behavior/Discharge Planning: Goal: Ability to manage health-related needs will improve Outcome: Progressing   Problem: Clinical Measurements: Goal: Ability to maintain clinical measurements within normal limits will improve Outcome: Progressing Goal: Cardiovascular complication will be avoided Outcome: Progressing   Problem: Activity: Goal: Risk for activity intolerance will decrease Outcome: Progressing   Problem: Coping: Goal: Level of anxiety will decrease Outcome: Progressing   Problem: Pain Managment: Goal: General experience of comfort will improve Outcome: Progressing   Problem: Safety: Goal: Ability to remain free from injury will improve Outcome: Progressing   Problem: Skin Integrity: Goal: Risk for impaired skin integrity will decrease Outcome: Progressing

## 2022-10-31 NOTE — Progress Notes (Signed)
Pharmacy Antibiotic Note  Tyler [name] is a 77 y.o. male admitted on 10/30/2022 with cellulitis.  Pharmacy has been consulted for Cefepime & Vancomycin dosing for 7 days.  Plan: Change to Cefepime 2 gm q8hr due to improvement in renal function  Change vancomycin to 1500 mg IV every 24 hours Goal AUC 400-550 Estimated AUC 481.4, Cmin 10.6 Vd coefficient 0.5, wt 115 kg, Scr 1.18 Vancomycin levels when clinically appropriate  Pharmacy will continue to follow and will adjust abx dosing whenever warranted.  Temp (24hrs), Avg:98.5 F (36.9 C), Min:98.1 F (36.7 C), Max:98.8 F (37.1 C)   Recent Labs  Lab 10/30/22 1742 10/30/22 2034 10/31/22 0402  WBC 13.1*  --  11.8*  CREATININE 1.46*  --  1.18  LATICACIDVEN 1.0 0.8  --      Estimated Creatinine Clearance: 69.5 mL/min (by C-G formula based on SCr of 1.18 mg/dL).    No Known Allergies  Antimicrobials this admission: 2/6 Zosyn >> x 1 dose 2/6 Vancomycin >> x 7 days 2/7 Cefepime >> x 7 days  Microbiology results: 2/06 BCx: Pending  Thank you for allowing pharmacy to be a part of this patient's care.  Tollie Eth III, PharmD 10/31/2022 7:52 AM

## 2022-10-31 NOTE — Progress Notes (Signed)
Pharmacy Antibiotic Note  Tyler [name] is a 77 y.o. male admitted on 10/30/2022 with cellulitis.  Pharmacy has been consulted for Cefepime & Vancomycin dosing for 7 days.  Plan: Cefepime 2 gm q12hr per indication & renal fxn.  Pt given Vancomycin 2000 mg once. Vancomycin 1750 mg IV Q 24 hrs. Goal AUC 400-550. Expected AUC: 473.5 SCr used: 0.72  Pharmacy will continue to follow and will adjust abx dosing whenever warranted.  Temp (24hrs), Avg:98.5 F (36.9 C), Min:98.1 F (36.7 C), Max:98.8 F (37.1 C)   Recent Labs  Lab 10/30/22 1742 10/30/22 2034  WBC 13.1*  --   CREATININE 1.46*  --   LATICACIDVEN 1.0 0.8    Estimated Creatinine Clearance: 56.2 mL/min (A) (by C-G formula based on SCr of 1.46 mg/dL (H)).    No Known Allergies  Antimicrobials this admission: 2/6 Zosyn >> x 1 dose 2/6 Vancomycin >> x 7 days 2/7 Cefepime >> x 7 days  Microbiology results: 2/06 BCx: Pending  Thank you for allowing pharmacy to be a part of this patient's care.  Renda Rolls, PharmD, MBA 10/31/2022 1:11 AM

## 2022-10-31 NOTE — Progress Notes (Addendum)
PROGRESS NOTE    Tyler [name]   DVV:[id number] DOB: 1946/01/21  DOA: 10/30/2022 Date of Service: 10/31/22 PCP: Rhetta Mura., MD     Brief Narrative / Hospital Course:  Tyler [name] is a 77 y.o. male with medical history significant of type 2 diabetes mellitus, atrial fibrillation who presents to the emergency department 10/30/22 due to infection of right third toe.  He states that he noted his right third toe turning dark few days ago and complained of pain in the affected area as well as fever.  02/06: Right foot x-ray showed soft tissue edema overlies the dorsum of the foot. No soft tissue gas or radiopaque foreign body. No radiographic findings of osteomyelitis. Started on IV vancomycin and Zosyn. Admitted to hospitalist service. Podiatry to see tomorrow per H&P.  02/07: ABI abn, vascular to discuss angio    Consultants:  Podiatry  Vascular surgery   Procedures: none      ASSESSMENT & PLAN:   Principal Problem:   Right foot infection Active Problems:   Cellulitis of right foot   Type 2 diabetes mellitus with complication, without long-term current use of insulin (HCC)   Persistent atrial fibrillation (HCC)   Essential hypertension   CKD (chronic kidney disease), stage III (HCC)   Obesity (BMI 30-39.9)  Right foot infection with superimposed cellulitis continue vancomycin and cefepime Podiatry following  ABI abn, vascular to discuss angio    Type 2 diabetes mellitus Continue ISS and hypoglycemia protocol Glipizide and metformin will be held at this time   Persistent atrial fibrillation Continue Coreg Patient was not on any anticoagulant per med rec   Essential hypertension (uncontrolled) Continue lisinopril and Coreg   CKD stage 3A AKI - resolved Restarted home dose lasix po today  Monitor BMP   Obesity (BMI 32.55) Diet and lifestyle modification     DVT prophylaxis: SCD Pertinent IV fluids/nutrition: no continuous IV fluids   Central lines / invasive devices: none  Code Status: FULL CODE   Current Admission Status: inpatient  TOC needs / Dispo plan: none at this time, pend PT/OT eval after podiatry consult may need HH Barriers to discharge / significant pending items: podiatry consult, IV abx for now, anticipate d/c based on podiatry recs (?surgical intervention) may be here a few more days              Subjective / Brief ROS:  Patient reports bothered by edema in RLE Denies CP/SOB.  Pain controlled.  Denies new weakness.  Tolerating diet.  Reports no concerns w/ urination/defecation.   Family Communication: none at this time     Objective Findings:  Vitals:   10/30/22 2200 10/30/22 2238 10/31/22 0428 10/31/22 0821  BP: (!) 143/63 (!) 157/87 (!) 149/64 (!) 154/76  Pulse: 72 (!) 58 87 61  Resp: 20 20 20 18   Temp:  98.1 F (36.7 C) 98.6 F (37 C) 99.6 F (37.6 C)  TempSrc:  Oral    SpO2: 98% 100% 96% 97%    Intake/Output Summary (Last 24 hours) at 10/31/2022 1346 Last data filed at 10/31/2022 1000 Gross per 24 hour  Intake 802.29 ml  Output --  Net 802.29 ml   There were no vitals filed for this visit.  Examination:  Physical Exam Constitutional:      General: He is not in acute distress.    Appearance: Normal appearance. He is not ill-appearing.  Cardiovascular:     Rate and Rhythm: Normal rate. Rhythm irregular.  Heart sounds: Normal heart sounds.  Pulmonary:     Effort: Pulmonary effort is normal.     Breath sounds: Normal breath sounds.  Musculoskeletal:     Right lower leg: Edema present.  Neurological:     Mental Status: He is alert.          Scheduled Medications:   carvedilol  3.125 mg Oral BID   furosemide  40 mg Oral Daily   insulin aspart  0-15 Units Subcutaneous TID WC   lisinopril  30 mg Oral Daily    Continuous Infusions:  ceFEPime (MAXIPIME) IV 2 g (10/31/22 1314)   vancomycin      PRN Medications:  acetaminophen **OR** acetaminophen,  ondansetron **OR** ondansetron (ZOFRAN) IV  Antimicrobials from admission:  Anti-infectives (From admission, onward)    Start     Dose/Rate Route Frequency Ordered Stop   10/31/22 1800  vancomycin (VANCOREADY) IVPB 1750 mg/350 mL  Status:  Discontinued        1,750 mg 175 mL/hr over 120 Minutes Intravenous Every 24 hours 10/31/22 0117 10/31/22 0759   10/31/22 1800  vancomycin (VANCOREADY) IVPB 1500 mg/300 mL        1,500 mg 150 mL/hr over 120 Minutes Intravenous Every 24 hours 10/31/22 0759 11/07/22 1759   10/31/22 1300  ceFEPIme (MAXIPIME) 2 g in sodium chloride 0.9 % 100 mL IVPB        2 g 200 mL/hr over 30 Minutes Intravenous Every 8 hours 10/31/22 0759 11/07/22 0459   10/31/22 0400  ceFEPIme (MAXIPIME) 2 g in sodium chloride 0.9 % 100 mL IVPB  Status:  Discontinued        2 g 200 mL/hr over 30 Minutes Intravenous Every 12 hours 10/31/22 0117 10/31/22 0759   10/31/22 0200  vancomycin (VANCOCIN) IVPB 1000 mg/200 mL premix  Status:  Discontinued        1,000 mg 200 mL/hr over 60 Minutes Intravenous  Once 10/31/22 0109 10/31/22 0117   10/31/22 0200  ceFEPIme (MAXIPIME) 2 g in sodium chloride 0.9 % 100 mL IVPB  Status:  Discontinued        2 g 200 mL/hr over 30 Minutes Intravenous  Once 10/31/22 0109 10/31/22 0117   10/30/22 2045  vancomycin (VANCOREADY) IVPB 2000 mg/400 mL        2,000 mg 200 mL/hr over 120 Minutes Intravenous  Once 10/30/22 2033 10/30/22 2319   10/30/22 2045  piperacillin-tazobactam (ZOSYN) IVPB 3.375 g        3.375 g 100 mL/hr over 30 Minutes Intravenous  Once 10/30/22 2033 10/30/22 2117           Data Reviewed:  I have personally reviewed the following...  CBC: Recent Labs  Lab 10/30/22 1742 10/31/22 0402  WBC 13.1* 11.8*  HGB 11.1* 10.6*  HCT 33.1* 31.7*  MCV 93.0 93.5  PLT 250 132   Basic Metabolic Panel: Recent Labs  Lab 10/30/22 1742 10/31/22 0402  NA 135 136  K 4.1 3.7  CL 103 107  CO2 19* 21*  GLUCOSE 158* 153*  BUN 40* 36*   CREATININE 1.46* 1.18  CALCIUM 8.7* 8.5*  MG  --  2.0  PHOS  --  3.6   GFR: Estimated Creatinine Clearance: 69.5 mL/min (by C-G formula based on SCr of 1.18 mg/dL). Liver Function Tests: Recent Labs  Lab 10/31/22 0402  AST 15  ALT 22  ALKPHOS 82  BILITOT 0.9  PROT 6.8  ALBUMIN 2.7*   No results for input(s): "LIPASE", "AMYLASE"  in the last 168 hours. No results for input(s): "AMMONIA" in the last 168 hours. Coagulation Profile: No results for input(s): "INR", "PROTIME" in the last 168 hours. Cardiac Enzymes: No results for input(s): "CKTOTAL", "CKMB", "CKMBINDEX", "TROPONINI" in the last 168 hours. BNP (last 3 results) No results for input(s): "PROBNP" in the last 8760 hours. HbA1C: Recent Labs    10/30/22 1742  HGBA1C 6.4*   CBG: Recent Labs  Lab 10/31/22 0044 10/31/22 0825 10/31/22 1212  GLUCAP 135* 157* 226*   Lipid Profile: No results for input(s): "CHOL", "HDL", "LDLCALC", "TRIG", "CHOLHDL", "LDLDIRECT" in the last 72 hours. Thyroid Function Tests: No results for input(s): "TSH", "T4TOTAL", "FREET4", "T3FREE", "THYROIDAB" in the last 72 hours. Anemia Panel: No results for input(s): "VITAMINB12", "FOLATE", "FERRITIN", "TIBC", "IRON", "RETICCTPCT" in the last 72 hours. Most Recent Urinalysis On File:     Component Value Date/Time   COLORURINE YELLOW (A) 08/10/2022 1840   APPEARANCEUR HAZY (A) 08/10/2022 1840   APPEARANCEUR Clear 07/05/2014 2040   LABSPEC 1.016 08/10/2022 1840   LABSPEC 1.030 07/05/2014 2040   PHURINE 6.0 08/10/2022 1840   GLUCOSEU NEGATIVE 08/10/2022 1840   GLUCOSEU >=500 07/05/2014 2040   HGBUR NEGATIVE 08/10/2022 1840   BILIRUBINUR NEGATIVE 08/10/2022 1840   BILIRUBINUR Negative 07/05/2014 2040   KETONESUR NEGATIVE 08/10/2022 1840   PROTEINUR NEGATIVE 08/10/2022 1840   NITRITE NEGATIVE 08/10/2022 1840   LEUKOCYTESUR MODERATE (A) 08/10/2022 1840   LEUKOCYTESUR Negative 07/05/2014 2040   Sepsis  Labs: @LABRCNTIP (procalcitonin:4,lacticidven:4) Microbiology: Recent Results (from the past 240 hour(s))  Blood culture (routine x 2)     Status: None (Preliminary result)   Collection Time: 10/30/22  8:34 PM   Specimen: BLOOD  Result Value Ref Range Status   Specimen Description BLOOD LEFT ANTECUBITAL  Final   Special Requests   Final    BOTTLES DRAWN AEROBIC AND ANAEROBIC Blood Culture results may not be optimal due to an excessive volume of blood received in culture bottles   Culture   Final    NO GROWTH < 24 HOURS Performed at [facility], [address] [place], [place] [address]    Report Status PENDING  Incomplete  Blood culture (routine x 2)     Status: None (Preliminary result)   Collection Time: 10/30/22  8:34 PM   Specimen: BLOOD  Result Value Ref Range Status   Specimen Description BLOOD RIGHT ANTECUBITAL  Final   Special Requests   Final    BOTTLES DRAWN AEROBIC AND ANAEROBIC Blood Culture adequate volume   Culture   Final    NO GROWTH < 24 HOURS Performed at [facility], [address] [place], [place] [address]    Report Status PENDING  Incomplete      Radiology Studies last 3 days: US ARTERIAL ABI (SCREENING LOWER EXTREMITY)  Result Date: 10/31/2022 CLINICAL DATA:  Right foot ulcer. EXAM: NONINVASIVE PHYSIOLOGIC VASCULAR STUDY OF BILATERAL LOWER EXTREMITIES TECHNIQUE: Evaluation of both lower extremities were performed at rest, including calculation of ankle-brachial indices with single level Doppler, pressure and pulse volume recording. COMPARISON:  None Available. FINDINGS: Right ABI:  1.12 Left ABI:  0.74 Right Lower Extremity: Monophasic posterior tibial and dorsalis pedis waveforms. Left Lower Extremity: Monophasic/near biphasic posterior tibial and dorsalis pedis waveforms. IMPRESSION: 1. Normal resting right ankle-brachial index with distal waveform abnormalities suggestive of some degree of tibial disease. 2. 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 Electronically Signed  By: Irish Lack M.D.   On: 10/31/2022 13:25   DG Foot Complete Right  Result Date: 10/30/2022 CLINICAL DATA:  Foot pain, swelling and fever. Concern for osteomyelitis. EXAM: RIGHT FOOT COMPLETE - 3+ VIEW COMPARISON:  No prior right foot exams. FINDINGS: The third toe alignment is distorted which limits assessment. Hammertoe deformity of the second and fourth toes. Osteoarthritis of the first metatarsal phalangeal joint. No fracture. No periostitis or frank erosive change. Vascular calcifications are seen. Soft tissue edema overlies the dorsum of the foot. No soft tissue gas or radiopaque foreign body. IMPRESSION: 1. Soft tissue edema overlies the dorsum of the foot. No soft tissue gas or radiopaque foreign body. 2. No radiographic findings of osteomyelitis. Distorted third toe alignment which limits assessment. 3. Hammertoe deformity of the second and fourth toes. Electronically Signed   By: Narda Rutherford M.D.   On: 10/30/2022 20:56             LOS: 1 day    Sunnie Nielsen, DO [facility] 10/31/2022, 1:46 PM    Dictation software may have been used to generate the above note. Typos may occur and escape review in typed/dictated notes. Please contact Dr Lyn Hollingshead directly for clarity if needed.  Staff may message me via secure chat in Epic  but this may not receive an immediate response,  please page me for urgent matters!  If 7PM-7AM, please contact night coverage [email or web address]

## 2022-10-31 NOTE — Plan of Care (Signed)
  Problem: Coping: Goal: Ability to adjust to condition or change in health will improve Outcome: Progressing   Problem: Health Behavior/Discharge Planning: Goal: Ability to identify and utilize available resources and services will improve Outcome: Progressing   Problem: Metabolic: Goal: Ability to maintain appropriate glucose levels will improve Outcome: Progressing   Problem: Nutritional: Goal: Maintenance of adequate nutrition will improve Outcome: Progressing   Problem: Skin Integrity: Goal: Risk for impaired skin integrity will decrease Outcome: Progressing   Problem: Health Behavior/Discharge Planning: Goal: Ability to manage health-related needs will improve Outcome: Progressing   Problem: Clinical Measurements: Goal: Ability to maintain clinical measurements within normal limits will improve Outcome: Progressing   Problem: Activity: Goal: Risk for activity intolerance will decrease Outcome: Progressing   Problem: Coping: Goal: Level of anxiety will decrease Outcome: Progressing   Problem: Pain Managment: Goal: General experience of comfort will improve Outcome: Progressing   Problem: Safety: Goal: Ability to remain free from injury will improve Outcome: Progressing

## 2022-10-31 NOTE — Consult Note (Signed)
Reason for Consult: Gangrene right third toe Referring Physician: Dr. Pleas Patricia A Tyler [name] is an 77 y.o. male.  HPI: He states the last few weeks he noticed the skin darkening at the tip of the toe not sure how it started not completely sure how long it has been there.  Said it started to smell and have an odor.  Says it feels better since he came into the hospital  Past Medical History:  Diagnosis Date   A-fib [facility])    Arthritis    Diabetes mellitus without complication ([place])    Presence of permanent cardiac pacemaker    Sick sinus syndrome ([place])    Urinary retention    chronic, with indwelling Foley catheter and plans for a suprapubic   Wears dentures    full upper    Past Surgical History:  Procedure Laterality Date   CATARACT EXTRACTION W/PHACO Right 01/16/2017   Procedure: CATARACT EXTRACTION PHACO AND INTRAOCULAR LENS PLACEMENT ([place])  Right Complicated;  [name], MD;  [name];  Service: Ophthalmology;  Laterality: Right;  IVA Block [place] 5 malyugin vision blue Diabetic - oral meds   CATARACT EXTRACTION W/PHACO Left 10/23/2022   Procedure: CATARACT EXTRACTION PHACO AND INTRAOCULAR LENS PLACEMENT ([place]) LEFT DIABETIC;  [name], MD;  [name];  Service: Ophthalmology;  Laterality: Left;  Diabetic   INSERT / REPLACE / REMOVE PACEMAKER  09/15/2020   Med[employer] [id number]   METATARSAL HEAD EXCISION Left 07/05/2018   Procedure: RESECTION FIRST METATARSAL INFECTED BONE AND SOFT TISSUE;  [name], [name];  [name];  Service: Podiatry;  Laterality: Left;   TOE AMPUTATION Left    TONSILLECTOMY      No family history on file.  Social History:  reports that he has never smoked. He has never used smokeless tobacco. He reports that he does not drink alcohol and does not use drugs.  Allergies: No Known Allergies  Medications: I have reviewed the patient's current  medications.  Results for orders placed or performed during the hospital encounter of 10/30/22 (from the past 48 hour(s))  CBC     Status: Abnormal   Collection Time: 10/30/22  5:42 PM  Result Value Ref Range   WBC 13.1 (H) 4.0 - 10.5 K/uL   RBC 3.56 (L) 4.22 - 5.81 MIL/uL   Hemoglobin 11.1 (L) 13.0 - 17.0 g/dL   HCT 33.1 (L) 39.0 - 52.0 %   MCV 93.0 80.0 - 100.0 fL   MCH 31.2 26.0 - 34.0 pg   MCHC 33.5 30.0 - 36.0 g/dL   RDW 12.4 11.5 - 15.5 %   Platelets 250 150 - 400 K/uL   nRBC 0.0 0.0 - 0.2 %    Comment: Performed at [facility], [address] [place], [place] [address]  Basic metabolic panel     Status: Abnormal   Collection Time: 10/30/22  5:42 PM  Result Value Ref Range   Sodium 135 135 - 145 mmol/L   Potassium 4.1 3.5 - 5.1 mmol/L   Chloride 103 98 - 111 mmol/L   CO2 19 (L) 22 - 32 mmol/L   Glucose, Bld 158 (H) 70 - 99 mg/dL    Comment: Glucose reference range applies only to samples taken after fasting for at least 8 hours.   BUN 40 (H) 8 - 23 mg/dL   Creatinine, Ser 1.46 (H) 0.61 - 1.24 mg/dL   Calcium 8.7 (L) 8.9 - 10.3 mg/dL  GFR, Estimated 49 (L) >60 mL/min    Comment: (NOTE) Calculated using the CKD-EPI Creatinine Equation (2021)    Anion gap 13 5 - 15    Comment: Performed at [facility], [address] [place], [place] [address]  Lactic acid, plasma     Status: None   Collection Time: 10/30/22  5:42 PM  Result Value Ref Range   Lactic Acid, Venous 1.0 0.5 - 1.9 mmol/L    Comment: Performed at [facility], [address] [place], [place] [address]  Hemoglobin A1c     Status: Abnormal   Collection Time: 10/30/22  5:42 PM  Result Value Ref Range   Hgb A1c MFr Bld 6.4 (H) 4.8 - 5.6 %    Comment: (NOTE) Pre diabetes:          5.7%-6.4%  Diabetes:              >6.4%  Glycemic control for   <7.0% adults with diabetes    Mean Plasma Glucose 136.98 mg/dL    Comment: Performed at [facility], [place] [address] [place], [place] [address]  Lactic acid, plasma     Status: None   Collection Time: 10/30/22  8:34 PM  Result Value Ref Range   Lactic Acid, Venous 0.8 0.5 - 1.9 mmol/L    Comment: Performed at [facility], [address] [place], [place] [address]  Blood culture (routine x 2)     Status: None (Preliminary result)   Collection Time: 10/30/22  8:34 PM   Specimen: BLOOD  Result Value Ref Range   Specimen Description BLOOD LEFT ANTECUBITAL    Special Requests      BOTTLES DRAWN AEROBIC AND ANAEROBIC Blood Culture results may not be optimal due to an excessive volume of blood received in culture bottles   Culture      NO GROWTH < 24 HOURS Performed at [facility], [address] [place], [place] [address]    Report Status PENDING   Blood culture (routine x 2)     Status: None (Preliminary result)   Collection Time: 10/30/22  8:34 PM   Specimen: BLOOD  Result Value Ref Range   Specimen Description BLOOD RIGHT ANTECUBITAL    Special Requests      BOTTLES DRAWN AEROBIC AND ANAEROBIC Blood Culture adequate volume   Culture      NO GROWTH < 24 HOURS Performed at [facility], [address] [place], [place] [address]    Report Status PENDING   Glucose, capillary     Status: Abnormal   Collection Time: 10/31/22 12:44 AM  Result Value Ref Range   Glucose-Capillary 135 (H) 70 - 99 mg/dL    Comment: Glucose reference range applies only to samples taken after fasting for at least 8 hours.  Comprehensive metabolic panel     Status: Abnormal   Collection Time: 10/31/22  4:02 AM  Result Value Ref Range   Sodium 136 135 - 145 mmol/L   Potassium 3.7 3.5 - 5.1 mmol/L   Chloride 107 98 - 111 mmol/L   CO2 21 (L) 22 - 32 mmol/L   Glucose, Bld 153 (H) 70 - 99 mg/dL    Comment: Glucose reference range applies only to samples taken after fasting for at least 8 hours.   BUN 36 (H) 8 - 23 mg/dL   Creatinine, Ser 1.18 0.61 - 1.24 mg/dL   Calcium 8.5 (L) 8.9 - 10.3  mg/dL  Total Protein 6.8 6.5 - 8.1 g/dL   Albumin 2.7 (L) 3.5 - 5.0 g/dL   AST 15 15 - 41 U/L   ALT 22 0 - 44 U/L   Alkaline Phosphatase 82 38 - 126 U/L   Total Bilirubin 0.9 0.3 - 1.2 mg/dL   GFR, Estimated >60 >60 mL/min    Comment: (NOTE) Calculated using the CKD-EPI Creatinine Equation (2021)    Anion gap 8 5 - 15    Comment: Performed at [facility], [address] [place], [place] [address]  CBC     Status: Abnormal   Collection Time: 10/31/22  4:02 AM  Result Value Ref Range   WBC 11.8 (H) 4.0 - 10.5 K/uL   RBC 3.39 (L) 4.22 - 5.81 MIL/uL   Hemoglobin 10.6 (L) 13.0 - 17.0 g/dL   HCT 31.7 (L) 39.0 - 52.0 %   MCV 93.5 80.0 - 100.0 fL   MCH 31.3 26.0 - 34.0 pg   MCHC 33.4 30.0 - 36.0 g/dL   RDW 12.4 11.5 - 15.5 %   Platelets 240 150 - 400 K/uL   nRBC 0.0 0.0 - 0.2 %    Comment: Performed at [facility], [address], [place], [place] [address]  Magnesium     Status: None   Collection Time: 10/31/22  4:02 AM  Result Value Ref Range   Magnesium 2.0 1.7 - 2.4 mg/dL    Comment: Performed at [facility], [address], [place], [place] [address]  Phosphorus     Status: None   Collection Time: 10/31/22  4:02 AM  Result Value Ref Range   Phosphorus 3.6 2.5 - 4.6 mg/dL    Comment: Performed at [facility], [address] [place],  [address]  Glucose, capillary     Status: Abnormal   Collection Time: 10/31/22  8:25 AM  Result Value Ref Range   Glucose-Capillary 157 (H) 70 - 99 mg/dL    Comment: Glucose reference range applies only to samples taken after fasting for at least 8 hours.   Comment 1 Notify RN    Comment 2 Document in Chart   Glucose, capillary     Status: Abnormal   Collection Time: 10/31/22 12:12 PM  Result Value Ref Range   Glucose-Capillary 226 (H) 70 - 99 mg/dL    Comment: Glucose reference range applies only to samples taken after fasting for at least 8 hours.   Comment 1 Notify RN    Comment 2  Document in Chart   Glucose, capillary     Status: Abnormal   Collection Time: 10/31/22  5:17 PM  Result Value Ref Range   Glucose-Capillary 155 (H) 70 - 99 mg/dL    Comment: Glucose reference range applies only to samples taken after fasting for at least 8 hours.   Comment 1 Notify RN    Comment 2 Document in Chart     US ARTERIAL ABI (SCREENING LOWER EXTREMITY)  Result Date: 10/31/2022 CLINICAL DATA:  Right foot ulcer. EXAM: NONINVASIVE PHYSIOLOGIC VASCULAR STUDY OF BILATERAL LOWER EXTREMITIES TECHNIQUE: Evaluation of both lower extremities were performed at rest, including calculation of ankle-brachial indices with single level Doppler, pressure and pulse volume recording. COMPARISON:  None Available. FINDINGS: Right ABI:  1.12 Left ABI:  0.74 Right Lower Extremity: Monophasic posterior tibial and dorsalis pedis waveforms. Left Lower Extremity: Monophasic/near biphasic posterior tibial and dorsalis pedis waveforms. IMPRESSION: 1. Normal resting right ankle-brachial index with distal waveform abnormalities suggestive of some  degree of tibial disease. 2. 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 Electronically Signed   By: Irish Lack M.D.   On: 10/31/2022 13:25   DG Foot Complete Right  Result Date: 10/30/2022 CLINICAL DATA:  Foot pain, swelling and fever. Concern for osteomyelitis. EXAM: RIGHT FOOT COMPLETE - 3+ VIEW COMPARISON:  No prior right foot exams. FINDINGS: The third toe alignment is distorted which limits assessment. Hammertoe deformity of the second and fourth toes. Osteoarthritis of the first metatarsal phalangeal joint. No fracture. No periostitis or frank erosive change. Vascular calcifications are seen. Soft tissue edema overlies the dorsum of the foot. No soft tissue gas or radiopaque foreign body. IMPRESSION: 1. Soft tissue edema overlies the dorsum of the foot. No soft tissue gas or  radiopaque foreign body. 2. No radiographic findings of osteomyelitis. Distorted third toe alignment which limits assessment. 3. Hammertoe deformity of the second and fourth toes. Electronically Signed   By: Narda Rutherford M.D.   On: 10/30/2022 20:56    Review of Systems  Constitutional:  Negative for chills, fever and malaise/fatigue.  Respiratory:  Negative for shortness of breath.   Cardiovascular:  Positive for leg swelling. Negative for chest pain.  Skin:        Dark skin on toe    Blood pressure (!) 142/70, pulse 77, temperature 100 F (37.8 C), temperature source Oral, resp. rate 18, SpO2 93 %.  Vitals:   10/31/22 1550 10/31/22 2007  BP: (!) 163/78 (!) 142/70  Pulse: 76 77  Resp: 18 18  Temp: 97.6 F (36.4 C) 100 F (37.8 C)  SpO2: 100% 93%    General AA&O x3. Normal mood and affect.  Vascular He has a faint DP pulse, the PT pulse is nonpalpable, sluggish cap fill time to toes, diminished hair growth  Neurologic Epicritic sensation grossly absent.  Dermatologic (Wound) Full-thickness chronic ulceration with gangrenous changes of the tip of the third toe  Orthopedic: Motor intact BLE.    Assessment/Plan:  Gangrene third toe, PAD -Imaging: Studies independently reviewed.  Unable to get MRI due to pacemaker.  Do think we will need it due to the amount of soft tissue infection even if there is not osteomyelitis he will still likely need toe amputation due to gangrene -Antibiotics: Continue broad-spectrum antibiotics vancomycin and cefepime -WB Status: WBAT -Wound Care: Please clean toe with saline and gauze, apply Betadine and gauze wrap -Surgical Plan: Will need toe amputation.  Vascular surgery has been consulted and is planning for angiography tomorrow 11/01/2022 or Friday, 11/02/2022.  Will plan for amputation thereafter likely Saturday or Sunday  Edwin Cap 10/31/2022, 8:23 PM   Best available via secure chat for questions or concerns.

## 2022-11-01 ENCOUNTER — Encounter: Payer: Self-pay | Admitting: Internal Medicine

## 2022-11-01 DIAGNOSIS — L089 Local infection of the skin and subcutaneous tissue, unspecified: Secondary | ICD-10-CM | POA: Diagnosis not present

## 2022-11-01 LAB — BASIC METABOLIC PANEL
Anion gap: 9 (ref 5–15)
BUN: 36 mg/dL — ABNORMAL HIGH (ref 8–23)
CO2: 21 mmol/L — ABNORMAL LOW (ref 22–32)
Calcium: 8.6 mg/dL — ABNORMAL LOW (ref 8.9–10.3)
Chloride: 103 mmol/L (ref 98–111)
Creatinine, Ser: 1.18 mg/dL (ref 0.61–1.24)
GFR, Estimated: >60 mL/min (ref >60)
Glucose, Bld: 177 mg/dL — ABNORMAL HIGH (ref 70–99)
Potassium: 3.5 mmol/L (ref 3.5–5.1)
Sodium: 133 mmol/L — ABNORMAL LOW (ref 135–145)

## 2022-11-01 LAB — GLUCOSE, CAPILLARY
Glucose-Capillary: 190 mg/dL — ABNORMAL HIGH (ref 70–99)
Glucose-Capillary: 177 mg/dL — ABNORMAL HIGH (ref 70–99)
Glucose-Capillary: 215 mg/dL — ABNORMAL HIGH (ref 70–99)
Glucose-Capillary: 186 mg/dL — ABNORMAL HIGH (ref 70–99)

## 2022-11-01 MED ORDER — SODIUM CHLORIDE 0.9 % IV SOLN: INTRAVENOUS | Status: DC

## 2022-11-01 MED ORDER — CHLORHEXIDINE GLUCONATE CLOTH 2 % EX PADS
6.0000 | MEDICATED_PAD | Freq: Once | CUTANEOUS | Status: AC
  Administered 2022-11-01: 6 via TOPICAL

## 2022-11-01 MED ORDER — ONDANSETRON HCL 4 MG/2ML IJ SOLN: 4.0000 mg | Freq: Four times a day (QID) | INTRAMUSCULAR | Status: DC | PRN

## 2022-11-01 MED ORDER — CHLORHEXIDINE GLUCONATE CLOTH 2 % EX PADS
6.0000 | MEDICATED_PAD | Freq: Every day | CUTANEOUS | Status: DC
  Administered 2022-11-02 – 2022-11-06 (×5): 6 via TOPICAL

## 2022-11-01 MED ORDER — FENTANYL CITRATE PF 50 MCG/ML IJ SOSY: 12.5000 ug | PREFILLED_SYRINGE | Freq: Once | INTRAMUSCULAR | Status: DC | PRN

## 2022-11-01 MED ORDER — HYDROMORPHONE HCL 1 MG/ML IJ SOLN: 1.0000 mg | Freq: Once | INTRAMUSCULAR | Status: DC | PRN

## 2022-11-01 MED ORDER — FAMOTIDINE 20 MG PO TABS: 40.0000 mg | ORAL_TABLET | Freq: Once | ORAL | Status: DC | PRN

## 2022-11-01 MED ORDER — METHYLPREDNISOLONE SODIUM SUCC 125 MG IJ SOLR: 125.0000 mg | Freq: Once | INTRAMUSCULAR | Status: DC | PRN

## 2022-11-01 MED ORDER — DIPHENHYDRAMINE HCL 50 MG/ML IJ SOLN: 50.0000 mg | Freq: Once | INTRAMUSCULAR | Status: DC | PRN

## 2022-11-01 MED ORDER — CHLORHEXIDINE GLUCONATE CLOTH 2 % EX PADS: 6.0000 | MEDICATED_PAD | Freq: Once | CUTANEOUS | Status: DC

## 2022-11-01 MED ORDER — MIDAZOLAM HCL 2 MG/ML PO SYRP: 8.0000 mg | ORAL_SOLUTION | Freq: Once | ORAL | Status: DC | PRN

## 2022-11-01 MED ORDER — CEFAZOLIN SODIUM-DEXTROSE 2-4 GM/100ML-% IV SOLN
2.0000 g | INTRAVENOUS | Status: AC
  Administered 2022-11-02: 2 g via INTRAVENOUS
  Filled 2022-11-01 (×2): qty 100

## 2022-11-01 NOTE — H&P (View-Only) (Signed)
Progress Note    11/01/2022 1:46 PM * No surgery date entered *  Subjective:  This is a 77 y.o. male  with medical history significant of type 2 diabetes mellitus, atrial fibrillation who presents to the emergency department 10/30/22 due to infection of right third toe.  He states that he noted his right third toe turning dark few days ago and complained of pain in the affected area as well as fever.    On 10/30/22 Right foot x-ray showed soft tissue edema overlies the dorsum of the foot. No soft tissue gas or radiopaque foreign body. No radiographic findings of osteomyelitis. Started on IV vancomycin and Zosyn. Admitted to hospitalist service.  On exam today the patient is resting comfortably in bed. No distress and no complaints over night. Vitals all remained stable.    Vitals:   11/01/22 0523 11/01/22 0900  BP: (!) 155/79 (!) 154/79  Pulse: 83 82  Resp: 20 18  Temp: 98.6 F (37 C) 98.6 F (37 C)  SpO2: 97% 98%   Physical Exam: Cardiac:  Irregular Hx Afib Lungs:  normal non-labored breathing, without Rales, rhonchi, wheezing  Incisions:  None Extremities:  Right lower extremity is +4 edema with cellulitic changes. 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  Abdomen:  Positive bowel sounds, soft, flat, NT/ND Neurologic: AAOX3 Neuro intact, answers all questions appropriately.   CBC    Component Value Date/Time   WBC 11.8 (H) 10/31/2022 0402   RBC 3.39 (L) 10/31/2022 0402   HGB 10.6 (L) 10/31/2022 0402   HGB 11.1 (L) 07/09/2014 0452   HCT 31.7 (L) 10/31/2022 0402   HCT 34.3 (L) 07/09/2014 0452   PLT 240 10/31/2022 0402   PLT 358 07/09/2014 0452   MCV 93.5 10/31/2022 0402   MCV 90 07/09/2014 0452   MCH 31.3 10/31/2022 0402   MCHC 33.4 10/31/2022 0402   RDW 12.4 10/31/2022 0402   RDW 12.4 07/09/2014 0452   LYMPHSABS 1.0 09/20/2022 0109   LYMPHSABS 1.5 07/09/2014 0452   MONOABS 0.8 09/20/2022 0109   MONOABS 0.7 07/09/2014 0452   EOSABS 0.2 09/20/2022 0109   EOSABS 0.3 07/09/2014  0452   BASOSABS 0.0 09/20/2022 0109   BASOSABS 0.1 07/09/2014 0452    BMET    Component Value Date/Time   NA 133 (L) 11/01/2022 0459   NA 142 07/10/2014 0549   K 3.5 11/01/2022 0459   K 3.7 07/10/2014 0549   CL 103 11/01/2022 0459   CL 109 (H) 07/10/2014 0549   CO2 21 (L) 11/01/2022 0459   CO2 24 07/10/2014 0549   GLUCOSE 177 (H) 11/01/2022 0459   GLUCOSE 134 (H) 07/10/2014 0549   BUN 36 (H) 11/01/2022 0459   BUN 10 07/10/2014 0549   CREATININE 1.18 11/01/2022 0459   CREATININE 1.26 07/10/2014 0549   CALCIUM 8.6 (L) 11/01/2022 0459   CALCIUM 8.3 (L) 07/10/2014 0549   GFRNONAA >60 11/01/2022 0459   GFRNONAA >60 07/10/2014 0549   GFRAA >60 07/07/2018 0440   GFRAA >60 07/10/2014 0549    INR    Component Value Date/Time   INR 1.27 07/04/2018 0423   INR 1.1 07/05/2014 2040     Intake/Output Summary (Last 24 hours) at 11/01/2022 1346 Last data filed at 11/01/2022 0300 Gross per 24 hour  Intake 740 ml  Output --  Net 740 ml     Assessment/Plan:  77 y.o. 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  *  No surgery date entered *   PLAN:  Vascular Surgery plan is to do a right lower extremity angiogram tomorrow 11/02/2022. Patient will be NPO after midnight.  Podiatry saw patient and is recommending at minimum 3rd toe amputation.  DVT prophylaxis:  Heparin 5000 units SQ Q8hrs   Angelie Kram R [facility] 11/01/2022 1:46 PM

## 2022-11-01 NOTE — Progress Notes (Signed)
PROGRESS NOTE    Tyler [name]   DGU:[id number] DOB: [birth year]  DOA: 10/30/2022 Date of Service: 11/01/22 PCP: Rhetta Mura., MD     Brief Narrative / Hospital Course:  Tyler [name] is a 77 y.o. male with medical history significant of type 2 diabetes mellitus, atrial fibrillation who presents to the emergency department 10/30/22 due to infection of right third toe.  He states that he noted his right third toe turning dark few days ago and complained of pain in the affected area as well as fever.  02/06: Right foot x-ray showed soft tissue edema overlies the dorsum of the foot. No soft tissue gas or radiopaque foreign body. No radiographic findings of osteomyelitis. Started on IV vancomycin and Zosyn. Admitted to hospitalist service. Podiatry to see tomorrow per H&P.  02/07: ABI abn, vascular planning angio Thu 02/08 or Fri 02/09, podiatry planning amputation toe Sat/Sun 02/08: RLE angiography tomorrow   Consultants:  Podiatry  Vascular surgery   Procedures: [PLANNED RLE angiogram 11/02/22]       ASSESSMENT & PLAN:   Principal Problem:   Right foot infection Active Problems:   Cellulitis of right foot   Type 2 diabetes mellitus with complication, without long-term current use of insulin (HCC)   Persistent atrial fibrillation (HCC)   Essential hypertension   CKD (chronic kidney disease), stage III (HCC)   Obesity (BMI 30-39.9)  Right foot infection with superimposed cellulitis PVD continue vancomycin and cefepime vascular planning angio Thu 02/08 or Fri 02/09 podiatry planning amputation toe Sat/Sun    Type 2 diabetes mellitus Continue ISS and hypoglycemia protocol Glipizide and metformin will be held at this time   Persistent atrial fibrillation Continue Coreg Patient was not on any anticoagulant per med rec   Essential hypertension (uncontrolled) Continue lisinopril and Coreg   CKD stage 3A AKI - resolved Restarted home dose lasix po   Monitor BMP   Obesity (BMI 32.55) Diet and lifestyle modification     DVT prophylaxis: SCD Pertinent IV fluids/nutrition: no continuous IV fluids  Central lines / invasive devices: none  Code Status: FULL CODE   Current Admission Status: inpatient  TOC needs / Dispo plan: none at this time, pend PT/OT eval after toe amputation may benefit [facility] PT Barriers to discharge / significant pending items: podiatry and vascular recs, IV abx for now, anticipate toe amputation over the weekend              Subjective / Brief ROS:  Patient reports bothered by edema in RLE Denies CP/SOB.  Pain controlled.  Denies new weakness.  Tolerating diet.  Reports no concerns w/ urination/defecation.   Family Communication: none at this time     Objective Findings:  Vitals:   10/31/22 1550 10/31/22 2007 11/01/22 0523 11/01/22 0900  BP: (!) 163/78 (!) 142/70 (!) 155/79 (!) 154/79  Pulse: 76 77 83 82  Resp: 18 18 20 18   Temp: 97.6 F (36.4 C) 100 F (37.8 C) 98.6 F (37 C) 98.6 F (37 C)  TempSrc:  Oral Oral Oral  SpO2: 100% 93% 97% 98%    Intake/Output Summary (Last 24 hours) at 11/01/2022 1511 Last data filed at 11/01/2022 0300 Gross per 24 hour  Intake 740 ml  Output --  Net 740 ml   There were no vitals filed for this visit.  Examination:  Physical Exam Constitutional:      General: He is not in acute distress.    Appearance: Normal appearance. He  is not ill-appearing.  Cardiovascular:     Rate and Rhythm: Normal rate. Rhythm irregular.     Heart sounds: Normal heart sounds.  Pulmonary:     Effort: Pulmonary effort is normal.     Breath sounds: Normal breath sounds.  Musculoskeletal:     Right lower leg: Edema present.  Neurological:     Mental Status: He is alert.          Scheduled Medications:   carvedilol  3.125 mg Oral BID   Chlorhexidine Gluconate Cloth  6 each Topical Once   And   Chlorhexidine Gluconate Cloth  6 each Topical Once    Chlorhexidine Gluconate Cloth  6 each Topical Daily   furosemide  40 mg Oral Daily   heparin injection (subcutaneous)  5,000 Units Subcutaneous Q8H   insulin aspart  0-15 Units Subcutaneous TID WC   lisinopril  30 mg Oral Daily    Continuous Infusions:  ceFEPime (MAXIPIME) IV 2 g (11/01/22 1241)   vancomycin 1,500 mg (10/31/22 1755)    PRN Medications:  acetaminophen **OR** acetaminophen, guaiFENesin-dextromethorphan, ondansetron **OR** ondansetron (ZOFRAN) IV  Antimicrobials from admission:  Anti-infectives (From admission, onward)    Start     Dose/Rate Route Frequency Ordered Stop   10/31/22 1800  vancomycin (VANCOREADY) IVPB 1750 mg/350 mL  Status:  Discontinued        1,750 mg 175 mL/hr over 120 Minutes Intravenous Every 24 hours 10/31/22 0117 10/31/22 0759   10/31/22 1800  vancomycin (VANCOREADY) IVPB 1500 mg/300 mL        1,500 mg 150 mL/hr over 120 Minutes Intravenous Every 24 hours 10/31/22 0759 11/07/22 1759   10/31/22 1300  ceFEPIme (MAXIPIME) 2 g in sodium chloride 0.9 % 100 mL IVPB        2 g 200 mL/hr over 30 Minutes Intravenous Every 8 hours 10/31/22 0759 11/07/22 0459   10/31/22 0400  ceFEPIme (MAXIPIME) 2 g in sodium chloride 0.9 % 100 mL IVPB  Status:  Discontinued        2 g 200 mL/hr over 30 Minutes Intravenous Every 12 hours 10/31/22 0117 10/31/22 0759   10/31/22 0200  vancomycin (VANCOCIN) IVPB 1000 mg/200 mL premix  Status:  Discontinued        1,000 mg 200 mL/hr over 60 Minutes Intravenous  Once 10/31/22 0109 10/31/22 0117   10/31/22 0200  ceFEPIme (MAXIPIME) 2 g in sodium chloride 0.9 % 100 mL IVPB  Status:  Discontinued        2 g 200 mL/hr over 30 Minutes Intravenous  Once 10/31/22 0109 10/31/22 0117   10/30/22 2045  vancomycin (VANCOREADY) IVPB 2000 mg/400 mL        2,000 mg 200 mL/hr over 120 Minutes Intravenous  Once 10/30/22 2033 10/30/22 2319   10/30/22 2045  piperacillin-tazobactam (ZOSYN) IVPB 3.375 g        3.375 g 100 mL/hr over 30 Minutes  Intravenous  Once 10/30/22 2033 10/30/22 2117           Data Reviewed:  I have personally reviewed the following...  CBC: Recent Labs  Lab 10/30/22 1742 10/31/22 0402  WBC 13.1* 11.8*  HGB 11.1* 10.6*  HCT 33.1* 31.7*  MCV 93.0 93.5  PLT 250 240   Basic Metabolic Panel: Recent Labs  Lab 10/30/22 1742 10/31/22 0402 11/01/22 0459  NA 135 136 133*  K 4.1 3.7 3.5  CL 103 107 103  CO2 19* 21* 21*  GLUCOSE 158* 153* 177*  BUN 40*  36* 36*  CREATININE 1.46* 1.18 1.18  CALCIUM 8.7* 8.5* 8.6*  MG  --  2.0  --   PHOS  --  3.6  --    GFR: Estimated Creatinine Clearance: 69.5 mL/min (by C-G formula based on SCr of 1.18 mg/dL). Liver Function Tests: Recent Labs  Lab 10/31/22 0402  AST 15  ALT 22  ALKPHOS 82  BILITOT 0.9  PROT 6.8  ALBUMIN 2.7*   No results for input(s): "LIPASE", "AMYLASE" in the last 168 hours. No results for input(s): "AMMONIA" in the last 168 hours. Coagulation Profile: No results for input(s): "INR", "PROTIME" in the last 168 hours. Cardiac Enzymes: No results for input(s): "CKTOTAL", "CKMB", "CKMBINDEX", "TROPONINI" in the last 168 hours. BNP (last 3 results) No results for input(s): "PROBNP" in the last 8760 hours. HbA1C: Recent Labs    10/30/22 1742  HGBA1C 6.4*   CBG: Recent Labs  Lab 10/31/22 1212 10/31/22 1717 10/31/22 2129 11/01/22 0838 11/01/22 1135  GLUCAP 226* 155* 209* 190* 177*   Lipid Profile: No results for input(s): "CHOL", "HDL", "LDLCALC", "TRIG", "CHOLHDL", "LDLDIRECT" in the last 72 hours. Thyroid Function Tests: No results for input(s): "TSH", "T4TOTAL", "FREET4", "T3FREE", "THYROIDAB" in the last 72 hours. Anemia Panel: No results for input(s): "VITAMINB12", "FOLATE", "FERRITIN", "TIBC", "IRON", "RETICCTPCT" in the last 72 hours. Most Recent Urinalysis On File:     Component Value Date/Time   COLORURINE YELLOW (A) 08/10/2022 1840   APPEARANCEUR HAZY (A) 08/10/2022 1840   APPEARANCEUR Clear 07/05/2014  2040   LABSPEC 1.016 08/10/2022 1840   LABSPEC 1.030 07/05/2014 2040   PHURINE 6.0 08/10/2022 1840   GLUCOSEU NEGATIVE 08/10/2022 1840   GLUCOSEU >=500 07/05/2014 2040   HGBUR NEGATIVE 08/10/2022 1840   BILIRUBINUR NEGATIVE 08/10/2022 1840   BILIRUBINUR Negative 07/05/2014 2040   KETONESUR NEGATIVE 08/10/2022 1840   PROTEINUR NEGATIVE 08/10/2022 1840   NITRITE NEGATIVE 08/10/2022 1840   LEUKOCYTESUR MODERATE (A) 08/10/2022 1840   LEUKOCYTESUR Negative 07/05/2014 2040   Sepsis Labs: @LABRCNTIP (procalcitonin:4,lacticidven:4) Microbiology: Recent Results (from the past 240 hour(s))  Blood culture (routine x 2)     Status: None (Preliminary result)   Collection Time: 10/30/22  8:34 PM   Specimen: BLOOD  Result Value Ref Range Status   Specimen Description BLOOD LEFT ANTECUBITAL  Final   Special Requests   Final    BOTTLES DRAWN AEROBIC AND ANAEROBIC Blood Culture results may not be optimal due to an excessive volume of blood received in culture bottles   Culture   Final    NO GROWTH 2 DAYS Performed at [facility], [address] [place], [place] [address]    Report Status PENDING  Incomplete  Blood culture (routine x 2)     Status: None (Preliminary result)   Collection Time: 10/30/22  8:34 PM   Specimen: BLOOD  Result Value Ref Range Status   Specimen Description BLOOD RIGHT ANTECUBITAL  Final   Special Requests   Final    BOTTLES DRAWN AEROBIC AND ANAEROBIC Blood Culture adequate volume   Culture   Final    NO GROWTH 2 DAYS Performed at [facility], [address] [place],  [address]    Report Status PENDING  Incomplete      Radiology Studies last 3 days: US ARTERIAL ABI (SCREENING LOWER EXTREMITY)  Result Date: 10/31/2022 CLINICAL DATA:  Right foot ulcer. EXAM: NONINVASIVE PHYSIOLOGIC VASCULAR STUDY OF BILATERAL LOWER EXTREMITIES TECHNIQUE: Evaluation of both lower extremities were performed at rest, including calculation of  ankle-brachial indices with single level Doppler, pressure and pulse volume recording. COMPARISON:  None Available. FINDINGS: Right ABI:  1.12 Left ABI:  0.74 Right Lower Extremity: Monophasic posterior tibial and dorsalis pedis waveforms. Left Lower Extremity: Monophasic/near biphasic posterior tibial and dorsalis pedis waveforms. IMPRESSION: 1. Normal resting right ankle-brachial index with distal waveform abnormalities suggestive of some degree of tibial disease. 2. 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 Electronically Signed   By: Aletta Edouard M.D.   On: 10/31/2022 13:25   DG Foot Complete Right  Result Date: 10/30/2022 CLINICAL DATA:  Foot pain, swelling and fever. Concern for osteomyelitis. EXAM: RIGHT FOOT COMPLETE - 3+ VIEW COMPARISON:  No prior right foot exams. FINDINGS: The third toe alignment is distorted which limits assessment. Hammertoe deformity of the second and fourth toes. Osteoarthritis of the first metatarsal phalangeal joint. No fracture. No periostitis or frank erosive change. Vascular calcifications are seen. Soft tissue edema overlies the dorsum of the foot. No soft tissue gas or radiopaque foreign body. IMPRESSION: 1. Soft tissue edema overlies the dorsum of the foot. No soft tissue gas or radiopaque foreign body. 2. No radiographic findings of osteomyelitis. Distorted third toe alignment which limits assessment. 3. Hammertoe deformity of the second and fourth toes. Electronically Signed   By: Keith Rake M.D.   On: 10/30/2022 20:56             LOS: 2 days    Emeterio Reeve, DO [facility] 11/01/2022, 3:11 PM    Dictation software may have been used to generate the above note. Typos may occur and escape review in typed/dictated notes. Please contact Dr Sheppard Coil directly for clarity if needed.  Staff may message me via secure chat in [place]  but this may not  receive an immediate response,  please page me for urgent matters!  If 7PM-7AM, please contact night coverage [email or web address]

## 2022-11-01 NOTE — Progress Notes (Signed)
Progress Note    11/01/2022 1:46 PM * No surgery date entered *  Subjective:  This is a 77 y.o. male  with medical history significant of type 2 diabetes mellitus, atrial fibrillation who presents to the emergency department 10/30/22 due to infection of right third toe.  He states that he noted his right third toe turning dark few days ago and complained of pain in the affected area as well as fever.    On 10/30/22 Right foot x-ray showed soft tissue edema overlies the dorsum of the foot. No soft tissue gas or radiopaque foreign body. No radiographic findings of osteomyelitis. Started on IV vancomycin and Zosyn. Admitted to hospitalist service.  On exam today the patient is resting comfortably in bed. No distress and no complaints over night. Vitals all remained stable.    Vitals:   11/01/22 0523 11/01/22 0900  BP: (!) 155/79 (!) 154/79  Pulse: 83 82  Resp: 20 18  Temp: 98.6 F (37 C) 98.6 F (37 C)  SpO2: 97% 98%   Physical Exam: Cardiac:  Irregular Hx Afib Lungs:  normal non-labored breathing, without Rales, rhonchi, wheezing  Incisions:  None Extremities:  Right lower extremity is +4 edema with cellulitic changes. 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  Abdomen:  Positive bowel sounds, soft, flat, NT/ND Neurologic: AAOX3 Neuro intact, answers all questions appropriately.   CBC    Component Value Date/Time   WBC 11.8 (H) 10/31/2022 0402   RBC 3.39 (L) 10/31/2022 0402   HGB 10.6 (L) 10/31/2022 0402   HGB 11.1 (L) 07/09/2014 0452   HCT 31.7 (L) 10/31/2022 0402   HCT 34.3 (L) 07/09/2014 0452   PLT 240 10/31/2022 0402   PLT 358 07/09/2014 0452   MCV 93.5 10/31/2022 0402   MCV 90 07/09/2014 0452   MCH 31.3 10/31/2022 0402   MCHC 33.4 10/31/2022 0402   RDW 12.4 10/31/2022 0402   RDW 12.4 07/09/2014 0452   LYMPHSABS 1.0 09/20/2022 0109   LYMPHSABS 1.5 07/09/2014 0452   MONOABS 0.8 09/20/2022 0109   MONOABS 0.7 07/09/2014 0452   EOSABS 0.2 09/20/2022 0109   EOSABS 0.3 07/09/2014  0452   BASOSABS 0.0 09/20/2022 0109   BASOSABS 0.1 07/09/2014 0452    BMET    Component Value Date/Time   NA 133 (L) 11/01/2022 0459   NA 142 07/10/2014 0549   K 3.5 11/01/2022 0459   K 3.7 07/10/2014 0549   CL 103 11/01/2022 0459   CL 109 (H) 07/10/2014 0549   CO2 21 (L) 11/01/2022 0459   CO2 24 07/10/2014 0549   GLUCOSE 177 (H) 11/01/2022 0459   GLUCOSE 134 (H) 07/10/2014 0549   BUN 36 (H) 11/01/2022 0459   BUN 10 07/10/2014 0549   CREATININE 1.18 11/01/2022 0459   CREATININE 1.26 07/10/2014 0549   CALCIUM 8.6 (L) 11/01/2022 0459   CALCIUM 8.3 (L) 07/10/2014 0549   GFRNONAA >60 11/01/2022 0459   GFRNONAA >60 07/10/2014 0549   GFRAA >60 07/07/2018 0440   GFRAA >60 07/10/2014 0549    INR    Component Value Date/Time   INR 1.27 07/04/2018 0423   INR 1.1 07/05/2014 2040     Intake/Output Summary (Last 24 hours) at 11/01/2022 1346 Last data filed at 11/01/2022 0300 Gross per 24 hour  Intake 740 ml  Output --  Net 740 ml     Assessment/Plan:  77 y.o. 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  *  No surgery date entered *   PLAN:  Vascular Surgery plan is to do a right lower extremity angiogram tomorrow 11/02/2022. Patient will be NPO after midnight.  Podiatry saw patient and is recommending at minimum 3rd toe amputation.  DVT prophylaxis:  Heparin 5000 units SQ Q8hrs   Shreshta Medley R [facility] 11/01/2022 1:46 PM

## 2022-11-02 ENCOUNTER — Encounter
Admission: EM | Disposition: A | Discharge status: Home / Self Care | Payer: Self-pay | Source: Home / Self Care | Attending: Osteopathic Medicine

## 2022-11-02 DIAGNOSIS — L089 Local infection of the skin and subcutaneous tissue, unspecified: Secondary | ICD-10-CM | POA: Diagnosis not present

## 2022-11-02 DIAGNOSIS — M79671 Pain in right foot: Secondary | ICD-10-CM | POA: Diagnosis not present

## 2022-11-02 DIAGNOSIS — L97519 Non-pressure chronic ulcer of other part of right foot with unspecified severity: Secondary | ICD-10-CM

## 2022-11-02 DIAGNOSIS — E11628 Type 2 diabetes mellitus with other skin complications: Secondary | ICD-10-CM | POA: Diagnosis not present

## 2022-11-02 DIAGNOSIS — I701 Atherosclerosis of renal artery: Secondary | ICD-10-CM | POA: Diagnosis not present

## 2022-11-02 DIAGNOSIS — I70235 Atherosclerosis of native arteries of right leg with ulceration of other part of foot: Secondary | ICD-10-CM | POA: Diagnosis not present

## 2022-11-02 HISTORY — PX: LOWER EXTREMITY ANGIOGRAPHY: CATH118251

## 2022-11-02 LAB — VANCOMYCIN, PEAK: Vancomycin Pk: 31 ug/mL (ref 30–40)

## 2022-11-02 LAB — GLUCOSE, CAPILLARY
Glucose-Capillary: 149 mg/dL — ABNORMAL HIGH (ref 70–99)
Glucose-Capillary: 160 mg/dL — ABNORMAL HIGH (ref 70–99)
Glucose-Capillary: 174 mg/dL — ABNORMAL HIGH (ref 70–99)
Glucose-Capillary: 189 mg/dL — ABNORMAL HIGH (ref 70–99)

## 2022-11-02 SURGERY — LOWER EXTREMITY ANGIOGRAPHY
Anesthesia: Moderate Sedation | Laterality: Right

## 2022-11-02 MED ORDER — CLOPIDOGREL BISULFATE 75 MG PO TABS
75.0000 mg | ORAL_TABLET | Freq: Every day | ORAL | Status: DC
  Administered 2022-11-02 – 2022-11-03 (×2): 75 mg via ORAL
  Filled 2022-11-02 (×5): qty 1

## 2022-11-02 MED ORDER — ASPIRIN 81 MG PO TBEC
81.0000 mg | DELAYED_RELEASE_TABLET | Freq: Every day | ORAL | Status: DC
  Administered 2022-11-02 – 2022-11-03 (×2): 81 mg via ORAL
  Filled 2022-11-02 (×5): qty 1

## 2022-11-02 MED ORDER — NITROGLYCERIN 1 MG/10 ML FOR IR/CATH LAB
INTRA_ARTERIAL | Status: DC | PRN
  Administered 2022-11-02: 400 ug via INTRA_ARTERIAL

## 2022-11-02 MED ORDER — ATORVASTATIN CALCIUM 20 MG PO TABS
20.0000 mg | ORAL_TABLET | Freq: Every day | ORAL | Status: DC
  Administered 2022-11-02 – 2022-11-06 (×4): 20 mg via ORAL
  Filled 2022-11-02 (×5): qty 1

## 2022-11-02 MED ORDER — FENTANYL CITRATE (PF) 100 MCG/2ML IJ SOLN
INTRAMUSCULAR | Status: DC | PRN
  Administered 2022-11-02: 50 ug via INTRAVENOUS
  Administered 2022-11-02 (×2): 25 ug via INTRAVENOUS

## 2022-11-02 MED ORDER — HEPARIN SODIUM (PORCINE) 1000 UNIT/ML IJ SOLN
INTRAMUSCULAR | Status: DC | PRN
  Administered 2022-11-02: 6000 [IU] via INTRAVENOUS

## 2022-11-02 MED ORDER — IODIXANOL 320 MG/ML IV SOLN
INTRAVENOUS | Status: DC | PRN
  Administered 2022-11-02: 65 mL

## 2022-11-02 MED ORDER — MIDAZOLAM HCL 2 MG/2ML IJ SOLN
INTRAMUSCULAR | Status: DC | PRN
  Administered 2022-11-02 (×3): 1 mg via INTRAVENOUS

## 2022-11-02 MED ORDER — NITROGLYCERIN 1 MG/10 ML FOR IR/CATH LAB
INTRA_ARTERIAL | Status: AC
  Filled 2022-11-02: qty 10

## 2022-11-02 MED ORDER — MIDAZOLAM HCL 5 MG/5ML IJ SOLN
INTRAMUSCULAR | Status: AC
  Filled 2022-11-02: qty 5

## 2022-11-02 MED ORDER — HEPARIN SODIUM (PORCINE) 1000 UNIT/ML IJ SOLN
INTRAMUSCULAR | Status: AC
  Filled 2022-11-02: qty 10

## 2022-11-02 MED ORDER — LISINOPRIL 20 MG PO TABS
40.0000 mg | ORAL_TABLET | Freq: Every day | ORAL | Status: DC
  Administered 2022-11-03 – 2022-11-06 (×3): 40 mg via ORAL
  Filled 2022-11-02 (×4): qty 2

## 2022-11-02 MED ORDER — FENTANYL CITRATE (PF) 100 MCG/2ML IJ SOLN
INTRAMUSCULAR | Status: AC
  Filled 2022-11-02: qty 2

## 2022-11-02 SURGICAL SUPPLY — 22 items
BALLN LUTONIX 018 6X60X130 (BALLOONS) ×1
BALLN LUTONIX DCB 6X100X130 (BALLOONS) ×2
BALLN ULTRVRSE 3X300X150 (BALLOONS) ×1
BALLN ULTRVRSE 3X300X150 OTW (BALLOONS) ×1
BALLOON LUTONIX 018 6X60X130 (BALLOONS) IMPLANT
BALLOON LUTONIX DCB 6X100X130 (BALLOONS) IMPLANT
BALLOON ULTRVRSE 3X300X150 OTW (BALLOONS) IMPLANT
CATH ANGIO 5F PIGTAIL 65CM (CATHETERS) IMPLANT
CATH BEACON 5 .038 100 VERT TP (CATHETERS) IMPLANT
CATH NAVICROSS ANGLED 135CM (MICROCATHETER) IMPLANT
COVER PROBE ULTRASOUND 5X96 (MISCELLANEOUS) IMPLANT
DEVICE STARCLOSE SE CLOSURE (Vascular Products) IMPLANT
GLIDEWIRE ADV .035X260CM (WIRE) IMPLANT
KIT ENCORE 26 ADVANTAGE (KITS) IMPLANT
PACK ANGIOGRAPHY (CUSTOM PROCEDURE TRAY) ×1 IMPLANT
SHEATH ANL2 6FRX45 HC (SHEATH) IMPLANT
SHEATH BRITE TIP 5FRX11 (SHEATH) IMPLANT
STENT LIFESTENT 5F 7X120X135 (Permanent Stent) IMPLANT
SYR MEDRAD MARK 7 150ML (SYRINGE) IMPLANT
TUBING CONTRAST HIGH PRESS 72 (TUBING) IMPLANT
WIRE G V18X300CM (WIRE) IMPLANT
WIRE GUIDERIGHT .035X150 (WIRE) IMPLANT

## 2022-11-02 NOTE — Progress Notes (Signed)
Rounding on patient, he is A&Ox4 and able to move around in room independently.  He is requesting to keep his bed slightly higher than in lowest position.  He is 6'6" tall.  I explained the risks to safety and he accepts responsibility for risk to his safety.

## 2022-11-02 NOTE — Progress Notes (Signed)
Arrived back to the unit with RN escort from Cath Lab

## 2022-11-02 NOTE — Progress Notes (Signed)
Pharmacy Antibiotic Note  Tyler [name] is a 77 y.o. male admitted on 10/30/2022 with cellulitis.  Pharmacy has been consulted for Cefepime & Vancomycin dosing for 7 days.  Pla  Plan: Continue Cefepime 2 gm q8hr  Continue vancomycin to 1500 mg IV every 24 hours Vancomycin peak ordered for 11/02/22 @ 2130 Vancomycin trough ordered for 11/03/22 @ 1700 Follow levels and renal function for adjustments  Pharmacy will continue to follow and will adjust abx dosing whenever warranted.  Temp (24hrs), Avg:98.3 F (36.8 C), Min:98 F (36.7 C), Max:98.6 F (37 C)   Recent Labs  Lab 10/30/22 1742 10/30/22 2034 10/31/22 0402 11/01/22 0459  WBC 13.1*  --  11.8*  --   CREATININE 1.46*  --  1.18 1.18  LATICACIDVEN 1.0 0.8  --   --      Estimated Creatinine Clearance: 69.3 mL/min (by C-G formula based on SCr of 1.18 mg/dL).    No Known Allergies  Antimicrobials this admission: 2/6 Zosyn >> x 1 dose 2/6 Vancomycin >> x 7 days 2/7 Cefepime >> x 7 days  Microbiology results: 2/06 BCx: ngtd  Thank you for allowing pharmacy to be a part of this patient's care.  Tollie Eth III, PharmD 11/02/2022 1:30 PM

## 2022-11-02 NOTE — Progress Notes (Signed)
Patient transported off unit to Cath Lab via bed with transporter.

## 2022-11-02 NOTE — Progress Notes (Signed)
PROGRESS NOTE    Tyler [name]   [id number] DOB: 03-13-1946  DOA: 10/30/2022 Date of Service: 11/02/22 PCP: Rhetta Mura., MD     Brief Narrative / Hospital Course:  Tyler [name] is a 77 y.o. male with medical history significant of type 2 diabetes mellitus, atrial fibrillation who presents to the emergency department 10/30/22 due to infection of right third toe.  He states that he noted his right third toe turning dark few days ago and complained of pain in the affected area as well as fever.  02/06: Right foot x-ray showed soft tissue edema overlies the dorsum of the foot. No soft tissue gas or radiopaque foreign body. No radiographic findings of osteomyelitis. Started on IV vancomycin and Zosyn. Admitted to hospitalist service. Podiatry to see tomorrow per H&P.  02/07: ABI abn, vascular planning angio Thu 02/08 or Fri 02/09, podiatry planning amputation toe Sat/Sun 02/08: RLE angiography tomorrow 02/09: RLE angiography today, see below. Podiatry plans at least toe amputation this weekend (today is Friday)   Consultants:  Podiatry  Vascular surgery   Procedures: RLE angiogram and balloon angioplasty R anterior tibial A, R popliteal A, R superficial femoral A, stent placement to SFA 11/02/22       ASSESSMENT & PLAN:   Principal Problem:   Right foot infection Active Problems:   Cellulitis of right foot   Type 2 diabetes mellitus with complication, without long-term current use of insulin (HCC)   Persistent atrial fibrillation (HCC)   Essential hypertension   CKD (chronic kidney disease), stage III (HCC)   Obesity (BMI 30-39.9)  Right foot infection with superimposed cellulitis PVD continue vancomycin and cefepime S/p angiography/angioplasty RLE 11/02/22  podiatry planning amputation toe Sat/Sun    Type 2 diabetes mellitus Continue ISS and hypoglycemia protocol Glipizide and metformin will be held at this time   Persistent atrial  fibrillation Continue Coreg Patient was not on any anticoagulant per med rec   Essential hypertension (uncontrolled) Continue lisinopril and Coreg   CKD stage 3A AKI - resolved Restarted home dose lasix po  Monitor BMP   Obesity (BMI 32.55) Diet and lifestyle modification     DVT prophylaxis: SCD Pertinent IV fluids/nutrition: no continuous IV fluids  Central lines / invasive devices: none  Code Status: FULL CODE   Current Admission Status: inpatient  TOC needs / Dispo plan: none at this time, pend PT/OT eval after toe amputation may benefit from [facility] PT Barriers to discharge / significant pending items: podiatry and vascular recs, IV abx for now, anticipate toe amputation over the weekend              Subjective / Brief ROS:  Patient reports leg and foot are feeling somewhat less painful after his procedure  Denies CP/SOB.  Pain controlled.  Denies new weakness.  Tolerating diet.  Reports no concerns w/ urination/defecation.   Family Communication: none at this time     Objective Findings:  Vitals:   11/02/22 0930 11/02/22 0945 11/02/22 1000 11/02/22 1130  BP: (!) 141/68 139/69 (!) 140/65 (!) 164/88  Pulse: 80 70 79 76  Resp: 20 20 20 18  $ Temp:    98.1 F (36.7 C)  TempSrc:    Oral  SpO2: 93% 94% 95% 97%  Weight:      Height:        Intake/Output Summary (Last 24 hours) at 11/02/2022 1345 Last data filed at 11/02/2022 0133 Gross per 24 hour  Intake 702.44 ml  Output --  Net 702.44 ml   Filed Weights   11/02/22 0357  Weight: 114.2 kg    Examination:  Physical Exam Constitutional:      General: He is not in acute distress.    Appearance: Normal appearance. He is not ill-appearing.  Cardiovascular:     Rate and Rhythm: Normal rate. Rhythm irregular.     Heart sounds: Normal heart sounds.  Pulmonary:     Effort: Pulmonary effort is normal.     Breath sounds: Normal breath sounds.  Musculoskeletal:     Right lower leg: Edema present.   Neurological:     Mental Status: He is alert.          Scheduled Medications:   aspirin EC  81 mg Oral Daily   atorvastatin  20 mg Oral Daily   carvedilol  3.125 mg Oral BID   Chlorhexidine Gluconate Cloth  6 each Topical Once   Chlorhexidine Gluconate Cloth  6 each Topical Q0600   clopidogrel  75 mg Oral Daily   furosemide  40 mg Oral Daily   heparin injection (subcutaneous)  5,000 Units Subcutaneous Q8H   insulin aspart  0-15 Units Subcutaneous TID WC   lisinopril  30 mg Oral Daily    Continuous Infusions:  ceFEPime (MAXIPIME) IV 2 g (11/02/22 1337)   vancomycin Stopped (11/01/22 1951)    PRN Medications:  acetaminophen **OR** acetaminophen, fentaNYL (SUBLIMAZE) injection, guaiFENesin-dextromethorphan, HYDROmorphone (DILAUDID) injection, ondansetron **OR** ondansetron (ZOFRAN) IV, ondansetron (ZOFRAN) IV  Antimicrobials from admission:  Anti-infectives (From admission, onward)    Start     Dose/Rate Route Frequency Ordered Stop   11/01/22 1925  ceFAZolin (ANCEF) IVPB 2g/100 mL premix        2 g 200 mL/hr over 30 Minutes Intravenous 30 min pre-op 11/01/22 1925 11/02/22 0852   10/31/22 1800  vancomycin (VANCOREADY) IVPB 1750 mg/350 mL  Status:  Discontinued        1,750 mg 175 mL/hr over 120 Minutes Intravenous Every 24 hours 10/31/22 0117 10/31/22 0759   10/31/22 1800  vancomycin (VANCOREADY) IVPB 1500 mg/300 mL        1,500 mg 150 mL/hr over 120 Minutes Intravenous Every 24 hours 10/31/22 0759 11/07/22 1759   10/31/22 1300  ceFEPIme (MAXIPIME) 2 g in sodium chloride 0.9 % 100 mL IVPB        2 g 200 mL/hr over 30 Minutes Intravenous Every 8 hours 10/31/22 0759 11/07/22 0459   10/31/22 0400  ceFEPIme (MAXIPIME) 2 g in sodium chloride 0.9 % 100 mL IVPB  Status:  Discontinued        2 g 200 mL/hr over 30 Minutes Intravenous Every 12 hours 10/31/22 0117 10/31/22 0759   10/31/22 0200  vancomycin (VANCOCIN) IVPB 1000 mg/200 mL premix  Status:  Discontinued        1,000  mg 200 mL/hr over 60 Minutes Intravenous  Once 10/31/22 0109 10/31/22 0117   10/31/22 0200  ceFEPIme (MAXIPIME) 2 g in sodium chloride 0.9 % 100 mL IVPB  Status:  Discontinued        2 g 200 mL/hr over 30 Minutes Intravenous  Once 10/31/22 0109 10/31/22 0117   10/30/22 2045  vancomycin (VANCOREADY) IVPB 2000 mg/400 mL        2,000 mg 200 mL/hr over 120 Minutes Intravenous  Once 10/30/22 2033 10/30/22 2319   10/30/22 2045  piperacillin-tazobactam (ZOSYN) IVPB 3.375 g        3.375 g 100 mL/hr over 30 Minutes Intravenous  Once 10/30/22  2033 10/30/22 2117           Data Reviewed:  I have personally reviewed the following...  CBC: Recent Labs  Lab 10/30/22 1742 10/31/22 0402  WBC 13.1* 11.8*  HGB 11.1* 10.6*  HCT 33.1* 31.7*  MCV 93.0 93.5  PLT 250 [id number]   Basic Metabolic Panel: Recent Labs  Lab 10/30/22 1742 10/31/22 0402 11/01/22 0459  NA 135 136 133*  K 4.1 3.7 3.5  CL 103 107 103  CO2 19* 21* 21*  GLUCOSE 158* 153* 177*  BUN 40* 36* 36*  CREATININE 1.46* 1.18 1.18  CALCIUM 8.7* 8.5* 8.6*  MG  --  2.0  --   PHOS  --  3.6  --    GFR: Estimated Creatinine Clearance: 69.3 mL/min (by C-G formula based on SCr of 1.18 mg/dL). Liver Function Tests: Recent Labs  Lab 10/31/22 0402  AST 15  ALT 22  ALKPHOS 82  BILITOT 0.9  PROT 6.8  ALBUMIN 2.7*   No results for input(s): "LIPASE", "AMYLASE" in the last 168 hours. No results for input(s): "AMMONIA" in the last 168 hours. Coagulation Profile: No results for input(s): "INR", "PROTIME" in the last 168 hours. Cardiac Enzymes: No results for input(s): "CKTOTAL", "CKMB", "CKMBINDEX", "TROPONINI" in the last 168 hours. BNP (last 3 results) No results for input(s): "PROBNP" in the last 8760 hours. HbA1C: Recent Labs    10/30/22 1742  HGBA1C 6.4*   CBG: Recent Labs  Lab 11/01/22 1135 11/01/22 1738 11/01/22 2213 11/02/22 0721 11/02/22 1138  GLUCAP 177* 215* 186* 149* 160*   Lipid Profile: No results for  input(s): "CHOL", "HDL", "LDLCALC", "TRIG", "CHOLHDL", "LDLDIRECT" in the last 72 hours. Thyroid Function Tests: No results for input(s): "TSH", "T4TOTAL", "FREET4", "T3FREE", "THYROIDAB" in the last 72 hours. Anemia Panel: No results for input(s): "VITAMINB12", "FOLATE", "FERRITIN", "TIBC", "IRON", "RETICCTPCT" in the last 72 hours. Most Recent Urinalysis On File:     Component Value Date/Time   COLORURINE YELLOW (A) 08/10/2022 1840   APPEARANCEUR HAZY (A) 08/10/2022 1840   APPEARANCEUR Clear 07/05/2014 2040   LABSPEC 1.016 08/10/2022 1840   LABSPEC 1.030 07/05/2014 2040   PHURINE 6.0 08/10/2022 1840   GLUCOSEU NEGATIVE 08/10/2022 1840   GLUCOSEU >=500 07/05/2014 2040   HGBUR NEGATIVE 08/10/2022 1840   BILIRUBINUR NEGATIVE 08/10/2022 1840   BILIRUBINUR Negative 07/05/2014 2040   KETONESUR NEGATIVE 08/10/2022 1840   PROTEINUR NEGATIVE 08/10/2022 1840   NITRITE NEGATIVE 08/10/2022 1840   LEUKOCYTESUR MODERATE (A) 08/10/2022 1840   LEUKOCYTESUR Negative 07/05/2014 2040   Sepsis Labs: @LABRCNTIP$ (procalcitonin:4,lacticidven:4) Microbiology: Recent Results (from the past 240 hour(s))  Blood culture (routine x 2)     Status: None (Preliminary result)   Collection Time: 10/30/22  8:34 PM   Specimen: BLOOD  Result Value Ref Range Status   Specimen Description BLOOD LEFT ANTECUBITAL  Final   Special Requests   Final    BOTTLES DRAWN AEROBIC AND ANAEROBIC Blood Culture results may not be optimal due to an excessive volume of blood received in culture bottles   Culture   Final    NO GROWTH 3 DAYS Performed at [facility], [address] [place], [place] [address]    Report Status PENDING  Incomplete  Blood culture (routine x 2)     Status: None (Preliminary result)   Collection Time: 10/30/22  8:34 PM   Specimen: BLOOD  Result Value Ref Range Status   Specimen Description BLOOD RIGHT ANTECUBITAL  Final   Special Requests  Final    BOTTLES DRAWN AEROBIC AND ANAEROBIC  Blood Culture adequate volume   Culture   Final    NO GROWTH 3 DAYS Performed at [facility], [address] [place], [place] [address]    Report Status PENDING  Incomplete      Radiology Studies last 3 days: PERIPHERAL VASCULAR CATHETERIZATION  Result Date: 11/02/2022 See surgical note for result.  US ARTERIAL ABI (SCREENING LOWER EXTREMITY)  Result Date: 10/31/2022 CLINICAL DATA:  Right foot ulcer. EXAM: NONINVASIVE PHYSIOLOGIC VASCULAR STUDY OF BILATERAL LOWER EXTREMITIES TECHNIQUE: Evaluation of both lower extremities were performed at rest, including calculation of ankle-brachial indices with single level Doppler, pressure and pulse volume recording. COMPARISON:  None Available. FINDINGS: Right ABI:  1.12 Left ABI:  0.74 Right Lower Extremity: Monophasic posterior tibial and dorsalis pedis waveforms. Left Lower Extremity: Monophasic/near biphasic posterior tibial and dorsalis pedis waveforms. IMPRESSION: 1. Normal resting right ankle-brachial index with distal waveform abnormalities suggestive of some degree of tibial disease. 2. 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 Electronically Signed   By: Aletta Edouard M.D.   On: 10/31/2022 13:25   DG Foot Complete Right  Result Date: 10/30/2022 CLINICAL DATA:  Foot pain, swelling and fever. Concern for osteomyelitis. EXAM: RIGHT FOOT COMPLETE - 3+ VIEW COMPARISON:  No prior right foot exams. FINDINGS: The third toe alignment is distorted which limits assessment. Hammertoe deformity of the second and fourth toes. Osteoarthritis of the first metatarsal phalangeal joint. No fracture. No periostitis or frank erosive change. Vascular calcifications are seen. Soft tissue edema overlies the dorsum of the foot. No soft tissue gas or radiopaque foreign body. IMPRESSION: 1. Soft tissue edema overlies the dorsum of the foot. No soft tissue gas or radiopaque  foreign body. 2. No radiographic findings of osteomyelitis. Distorted third toe alignment which limits assessment. 3. Hammertoe deformity of the second and fourth toes. Electronically Signed   By: Keith Rake M.D.   On: 10/30/2022 20:56             LOS: 3 days    Emeterio Reeve, DO [facility] 11/02/2022, 1:45 PM    Dictation software may have been used to generate the above note. Typos may occur and escape review in typed/dictated notes. Please contact Dr Sheppard Coil directly for clarity if needed.  Staff may message me via secure chat in [place]  but this may not receive an immediate response,  please page me for urgent matters!  If 7PM-7AM, please contact night coverage [email or web address]

## 2022-11-02 NOTE — TOC Progression Note (Signed)
Transition of Care [facility]) - Progression Note    Patient Details  Name: Tyler [name] MRN: [id number] Date of Birth: 1946/04/30  Transition of Care [facility]) CM/SW Contact  Beverly Sessions, RN Phone Number: 11/02/2022, 4:14 PM  Clinical Narrative:     Admitted for: foot infection Admitted from: home, lives with a friend PCP: tyson Pharmacy:[facility] Current home health/prior home health/DME Patient states that he has a RW and cane.  Patient states that he hasn't had a need for DME, is independent, drives  Patient states he does not feel like home health will be indicated at discharge. It has not yet been determined if patient will require IV antibiotics at discharge.  If so patient will require home health          Expected Discharge Plan and Services                                               Social Determinants of Health (SDOH) Interventions SDOH Screenings   Food Insecurity: No Food Insecurity (10/30/2022)  Housing: Low Risk  (10/30/2022)  Transportation Needs: No Transportation Needs (10/30/2022)  Utilities: Not At Risk (10/30/2022)  Tobacco Use: Low Risk  (11/01/2022)    Readmission Risk Interventions     No data to display

## 2022-11-02 NOTE — Progress Notes (Signed)
   PODIATRY PROGRESS NOTE  NAME Tyler [name] MRN [id number] DOB 04-Jan-1946 DOA 10/30/2022   Reason for consult:  Chief Complaint  Patient presents with   Foot Pain     History of present illness: 77 y.o. 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  Underwent angio today.  Denies any fevers or chills.  Vitals:   11/02/22 1130 11/02/22 1949  BP: (!) 164/88 (!) 171/68  Pulse: 76 74  Resp: 18 20  Temp: 98.1 F (36.7 C) 99.3 F (37.4 C)  SpO2: 97% 97%       Latest Ref Rng & Units 10/31/2022    4:02 AM 10/30/2022    5:42 PM 09/20/2022    1:09 AM  CBC  WBC 4.0 - 10.5 K/uL 11.8  13.1  5.3   Hemoglobin 13.0 - 17.0 g/dL 10.6  11.1  13.0   Hematocrit 39.0 - 52.0 % 31.7  33.1  40.3   Platelets 150 - 400 K/uL 240  250  157        Latest Ref Rng & Units 11/01/2022    4:59 AM 10/31/2022    4:02 AM 10/30/2022    5:42 PM  BMP  Glucose 70 - 99 mg/dL 177  153  158   BUN 8 - 23 mg/dL 36  36  40   Creatinine 0.61 - 1.24 mg/dL 1.18  1.18  1.46   Sodium 135 - 145 mmol/L 133  136  135   Potassium 3.5 - 5.1 mmol/L 3.5  3.7  4.1   Chloride 98 - 111 mmol/L 103  107  103   CO2 22 - 32 mmol/L 21  21  19    Calcium 8.9 - 10.3 mg/dL 8.6  8.5  8.7       Physical Exam: General: AAOx3, NAD  Dermatology: Ulceration noted as well as gangrenous changes of the right third toe.  Some purulence was noted from the digit.  There is no fluctuance or crepitation.  Vascular: Difficult to palpate pulses  Neurological: Sensation decreased  Musculoskeletal Exam: No pain on exam    ASSESSMENT/PLAN OF CARE  Gangrene third toe.   Amputation of toe he is well aware of this.  Plan to proceed with the toe amputation on Sunday.  He is in agreement with this.  Will further discuss tomorrow and plan for surgery on Sunday.  Podiatry will continue to follow.    Please contact me directly with any questions or concerns.     Celesta Gentile, [name] [facility]  Dr. Bonna Gains. [address],  [address] [place], [place] [address]                Office [phone]  Fax [phone]

## 2022-11-02 NOTE — Interval H&P Note (Signed)
History and Physical Interval Note:  11/02/2022 8:06 AM  Tyler [name]  has presented today for surgery, with the diagnosis of Right leg PAD.  The various methods of treatment have been discussed with the patient and family. After consideration of risks, benefits and other options for treatment, the patient has consented to  Procedure(s): Lower Extremity Angiography (Right) as a surgical intervention.  [name] history has been reviewed, patient examined, no change in status, stable for surgery.  I have reviewed the patient's chart and labs.  Questions were answered to the patient's satisfaction.     Leotis Pain

## 2022-11-02 NOTE — Op Note (Signed)
Tyler [name]  Percutaneous Study/Intervention Procedural Note   Date of Surgery: 11/02/2022  Surgeon(s):Tyler [name]    Assistants:none  Pre-operative Diagnosis: PAD with ulceration and infection right foot  Post-operative diagnosis:  Same  Procedure(s) Performed:             1.  Ultrasound guidance for vascular access left femoral artery             2.  Catheter placement into right common femoral artery from left femoral approach             3.  Aortogram and selective right lower extremity angiogram             4.  Percutaneous transluminal angioplasty of right anterior tibial artery with 2 inflations with a 3 mm diameter by 30 cm length angioplasty balloon             5.  Percutaneous transluminal angioplasty of the right popliteal artery with 6 mm diameter by 6 cm length Lutonix drug-coated angioplasty balloon and of the superficial femoral artery with a 6 mm diameter by 10 cm length Lutonix drug-coated angioplasty balloon  6.  Stent placement to the SFA for greater than 50% residual stenosis after angioplasty with a 7 mm diameter by 12 cm length life stent             7.  StarClose closure device left femoral artery  EBL: 15 cc  Contrast: 65 cc  Fluoro Time: 5.6 minutes  Moderate Conscious Sedation Time: approximately 50 minutes using 3 mg of Versed and 100 mcg of Fentanyl              Indications:  Patient is a 77 y.o.male with infection and ulceration of the right foot.  The patient is brought in for angiography for further evaluation and potential treatment.  Due to the limb threatening nature of the situation, angiogram was performed for attempted limb salvage. The patient is aware that if the procedure fails, amputation would be expected.  The patient also understands that even with successful revascularization, amputation may still be required due to the severity of the situation.  Risks and benefits are discussed and informed consent is obtained.    Procedure:  The patient was identified and appropriate procedural time out was performed.  The patient was then placed supine on the table and prepped and draped in the usual sterile fashion. Moderate conscious sedation was administered during a face to face encounter with the patient throughout the procedure with my supervision of the RN administering medicines and monitoring the patient's vital signs, pulse oximetry, telemetry and mental status throughout from the start of the procedure until the patient was taken to the recovery room. Ultrasound was used to evaluate the left common femoral artery.  It was patent .  A digital ultrasound image was acquired.  A Seldinger needle was used to access the left common femoral artery under direct ultrasound guidance and a permanent image was performed.  A 0.035 J wire was advanced without resistance and a 5Fr sheath was placed.  Pigtail catheter was placed into the aorta and an AP aortogram was performed. This demonstrated normal right renal artery and a moderate stenosis of the left renal artery and normal aorta and iliac segments without significant stenosis. I then crossed the aortic bifurcation and advanced to the right femoral head. Selective right lower extremity angiogram was then performed. This demonstrated fairly normal common femoral artery with small and diseased profunda femoris  artery.  The SFA was fairly normal proximally, but in the midsegment there was about a 90% stenosis in the SFA.  The vessel normalized down through Hunter's canal and in the popliteal artery just above the knee was another stenosis in the 70% range.  The anterior tibial artery was really the only runoff vessel Stilley and was continuous into the foot, but there were multiple areas of stenosis including about an 80 to 90% stenosis in the proximal segment, a 70 to 80% stenosis in the midsegment, and about a 70% stenosis in the distal segment just above the ankle.  The posterior  tibial and peroneal arteries did add collateral flow in the distal segments with reconstitution at the ankle but there was a long occlusion of the tibioperoneal trunk and the peroneal and posterior tibial arteries in the proximal and mid segments. . It was felt that it was in the patient's best interest to proceed with intervention after these images to avoid a second procedure and a larger amount of contrast and fluoroscopy based off of the findings from the initial angiogram. The patient was systemically heparinized and a 6 [place] Ansell sheath was then placed over the [employer] wire. I then used a Kumpe catheter and the advantage wire to cross through the SFA and popliteal stenoses without difficulty then exchanged for a Nava cross catheter and a V18 wire and cross the stenoses in the anterior tibial artery parking the wire in the foot.  I then proceeded with treatment.  2 inflations with a 3 mm diameter by 30 cm length angioplasty balloon were done in the anterior tibial artery from the level of the ankle up beyond the origin.  The distal inflation was 8 atm for 1 minute and the proximal inflation was 10 atm for 1 minute.  The popliteal lesion was addressed with a 6 mm diameter by 6 cm length Lutonix drug-coated angioplasty balloon inflated to 8 atm for 1 minute.  The SFA was addressed with a 6 mm diameter by 10 cm length Lutonix drug-coated angioplasty balloon inflated to 10 atm for 1 minute.  Completion imaging showed greater than 50% residual stenosis in the SFA with only about a 20-30% residual stenosis in the popliteal artery and less than 25% residual stenosis in all the areas treated in the anterior tibial artery with continuous flow to the foot.  I elected to stent the SFA.  A 7 mm diameter by 12 cm length life stent was deployed and postdilated with a 6 mm diameter Lutonix drug-coated angioplasty balloon with excellent angiographic completion result and less than 10% residual stenosis. I elected  to terminate the procedure. The sheath was removed and StarClose closure device was deployed in the left femoral artery with excellent hemostatic result. The patient was taken to the recovery room in stable condition having tolerated the procedure well.  Findings:               Aortogram:  This demonstrated normal right renal artery and a moderate stenosis of the left renal artery and normal aorta and iliac segments without significant stenosis.             Right Lower Extremity:  This demonstrated fairly normal common femoral artery with small and diseased profunda femoris artery.  The SFA was fairly normal proximally, but in the midsegment there was about a 90% stenosis in the SFA.  The vessel normalized down through Hunter's canal and in the popliteal artery just above the knee was another  stenosis in the 70% range.  The anterior tibial artery was really the only runoff vessel Stilley and was continuous into the foot, but there were multiple areas of stenosis including about an 80 to 90% stenosis in the proximal segment, a 70 to 80% stenosis in the midsegment, and about a 70% stenosis in the distal segment just above the ankle.  The posterior tibial and peroneal arteries did add collateral flow in the distal segments with reconstitution at the ankle but there was a long occlusion of the tibioperoneal trunk and the peroneal and posterior tibial arteries in the proximal and mid segments.    Disposition: Patient was taken to the recovery room in stable condition having tolerated the procedure well.  Complications: None  Tyler [name] 11/02/2022 9:12 AM   This note was created with Dragon Medical transcription system. Any errors in dictation are purely unintentional.

## 2022-11-03 DIAGNOSIS — L089 Local infection of the skin and subcutaneous tissue, unspecified: Principal | ICD-10-CM

## 2022-11-03 DIAGNOSIS — L03115 Cellulitis of right lower limb: Secondary | ICD-10-CM | POA: Diagnosis not present

## 2022-11-03 LAB — VANCOMYCIN, TROUGH: Vancomycin Tr: 14 ug/mL — ABNORMAL LOW (ref 15–20)

## 2022-11-03 LAB — GLUCOSE, CAPILLARY
Glucose-Capillary: 191 mg/dL — ABNORMAL HIGH (ref 70–99)
Glucose-Capillary: 258 mg/dL — ABNORMAL HIGH (ref 70–99)
Glucose-Capillary: 156 mg/dL — ABNORMAL HIGH (ref 70–99)
Glucose-Capillary: 150 mg/dL — ABNORMAL HIGH (ref 70–99)

## 2022-11-03 LAB — CREATININE, SERUM
Creatinine, Ser: 1.14 mg/dL (ref 0.61–1.24)
GFR, Estimated: >60 mL/min (ref >60)

## 2022-11-03 MED ORDER — MENTHOL 3 MG MT LOZG
1.0000 | LOZENGE | OROMUCOSAL | Status: DC | PRN
  Filled 2022-11-03: qty 9

## 2022-11-03 MED ORDER — SODIUM CHLORIDE 0.9 % IV SOLN: INTRAVENOUS | Status: DC | PRN

## 2022-11-03 MED ORDER — SALINE SPRAY 0.65 % NA SOLN
1.0000 | NASAL | Status: DC | PRN
  Administered 2022-11-03 – 2022-11-05 (×2): 1 via NASAL
  Filled 2022-11-03: qty 44

## 2022-11-03 MED ORDER — CARVEDILOL 6.25 MG PO TABS
6.2500 mg | ORAL_TABLET | Freq: Two times a day (BID) | ORAL | Status: DC
  Administered 2022-11-03 – 2022-11-06 (×5): 6.25 mg via ORAL
  Filled 2022-11-03 (×5): qty 1

## 2022-11-03 NOTE — Progress Notes (Signed)
PROGRESS NOTE    Tyler [name]   [id number] DOB: [birth year]  DOA: 10/30/2022 Date of Service: 11/03/22 PCP: Rhetta Mura., MD     Brief Narrative / Hospital Course:  Tyler [name] is a 76 y.o. male with medical history significant of type 2 diabetes mellitus, atrial fibrillation who presents to the emergency department 10/30/22 due to infection of right third toe.  He states that he noted his right third toe turning dark few days ago and complained of pain in the affected area as well as fever.  02/06: Right foot x-ray showed soft tissue edema overlies the dorsum of the foot. No soft tissue gas or radiopaque foreign body. No radiographic findings of osteomyelitis. Started on IV vancomycin and Zosyn. Admitted to hospitalist service. Podiatry to see tomorrow per H&P.  02/07: ABI abn, vascular planning angio Thu 02/08 or Fri 02/09, podiatry planning amputation toe Sat/Sun 02/08: RLE angiography tomorrow 02/09: RLE angiography today, see below. Podiatry plans at least toe amputation this weekend (today is Friday) 02/08: stable, amputation planned tomorrow   Consultants:  Podiatry  Vascular surgery   Procedures: RLE angiogram and balloon angioplasty R anterior tibial A, R popliteal A, R superficial femoral A, stent placement to SFA 11/02/22       ASSESSMENT & PLAN:   Principal Problem:   Right foot infection Active Problems:   Cellulitis of right foot   Type 2 diabetes mellitus with complication, without long-term current use of insulin (HCC)   Persistent atrial fibrillation (HCC)   Essential hypertension   CKD (chronic kidney disease), stage III (HCC)   Obesity (BMI 30-39.9)  Right foot infection with superimposed cellulitis PVD continue vancomycin and cefepime S/p angiography/angioplasty RLE 11/02/22  podiatry planning amputation toe tomorrow 11/04/22    Type 2 diabetes mellitus Continue ISS and hypoglycemia protocol Glipizide and metformin held at  this time   Persistent atrial fibrillation Continue Coreg - increased dose d/t HTN Patient was not on any anticoagulant PTA per med rec   Essential hypertension (uncontrolled) Continue lisinopril and Coreg - increased doses d/t HTN    CKD stage 3A AKI - resolved Restarted home dose lasix po  Monitor BMP   Obesity (BMI 32.55) Diet and lifestyle modification     DVT prophylaxis: SCD Pertinent IV fluids/nutrition: no continuous IV fluids  Central lines / invasive devices: none  Code Status: FULL CODE   Current Admission Status: inpatient  TOC needs / Dispo plan: none at this time, pend PT/OT eval after toe amputation, expect he may benefit from [facility] PT or outpatient PT Barriers to discharge / significant pending items: podiatry and vascular recs, IV abx for now, anticipate toe amputation tomorrow and hopefully discharge soon after than when confirm if will need continued IV abx or can transition to po or d/c abx              Subjective / Brief ROS:  Patient reports leg and foot are feeling somewhat less painful after his procedure  Denies CP/SOB.  No other concerns from pt or RN   Family Communication: none at this time     Objective Findings:  Vitals:   11/02/22 1130 11/02/22 1949 11/03/22 0349 11/03/22 0731  BP: (!) 164/88 (!) 171/68 (!) 144/66 (!) 157/80  Pulse: 76 74 79 77  Resp: 18 20 20 18  $ Temp: 98.1 F (36.7 C) 99.3 F (37.4 C) 98.6 F (37 C) 97.7 F (36.5 C)  TempSrc: Oral Oral Oral Oral  SpO2:  97% 97% 100% 96%  Weight:      Height:        Intake/Output Summary (Last 24 hours) at 11/03/2022 1140 Last data filed at 11/03/2022 1042 Gross per 24 hour  Intake 1454.33 ml  Output 500 ml  Net 954.33 ml   Filed Weights   11/02/22 0357  Weight: 114.2 kg    Examination:  Physical Exam Constitutional:      General: He is not in acute distress.    Appearance: Normal appearance. He is not ill-appearing.  Pulmonary:     Effort: Pulmonary  effort is normal.  Musculoskeletal:     Right lower leg: Edema (improved from previous exams) present.  Neurological:     Mental Status: He is alert.          Scheduled Medications:   aspirin EC  81 mg Oral Daily   atorvastatin  20 mg Oral Daily   carvedilol  6.25 mg Oral BID   Chlorhexidine Gluconate Cloth  6 each Topical Once   Chlorhexidine Gluconate Cloth  6 each Topical Q0600   clopidogrel  75 mg Oral Daily   furosemide  40 mg Oral Daily   heparin injection (subcutaneous)  5,000 Units Subcutaneous Q8H   insulin aspart  0-15 Units Subcutaneous TID WC   lisinopril  40 mg Oral Daily    Continuous Infusions:  sodium chloride Stopped (11/03/22 0635)   ceFEPime (MAXIPIME) IV Stopped (11/03/22 0543)   vancomycin Stopped (11/02/22 2022)    PRN Medications:  sodium chloride, acetaminophen **OR** acetaminophen, fentaNYL (SUBLIMAZE) injection, guaiFENesin-dextromethorphan, HYDROmorphone (DILAUDID) injection, ondansetron **OR** ondansetron (ZOFRAN) IV, ondansetron (ZOFRAN) IV  Antimicrobials from admission:  Anti-infectives (From admission, onward)    Start     Dose/Rate Route Frequency Ordered Stop   11/01/22 1925  ceFAZolin (ANCEF) IVPB 2g/100 mL premix        2 g 200 mL/hr over 30 Minutes Intravenous 30 min pre-op 11/01/22 1925 11/02/22 0852   10/31/22 1800  vancomycin (VANCOREADY) IVPB 1750 mg/350 mL  Status:  Discontinued        1,750 mg 175 mL/hr over 120 Minutes Intravenous Every 24 hours 10/31/22 0117 10/31/22 0759   10/31/22 1800  vancomycin (VANCOREADY) IVPB 1500 mg/300 mL        1,500 mg 150 mL/hr over 120 Minutes Intravenous Every 24 hours 10/31/22 0759 11/07/22 1759   10/31/22 1300  ceFEPIme (MAXIPIME) 2 g in sodium chloride 0.9 % 100 mL IVPB        2 g 200 mL/hr over 30 Minutes Intravenous Every 8 hours 10/31/22 0759 11/07/22 0459   10/31/22 0400  ceFEPIme (MAXIPIME) 2 g in sodium chloride 0.9 % 100 mL IVPB  Status:  Discontinued        2 g 200 mL/hr over  30 Minutes Intravenous Every 12 hours 10/31/22 0117 10/31/22 0759   10/31/22 0200  vancomycin (VANCOCIN) IVPB 1000 mg/200 mL premix  Status:  Discontinued        1,000 mg 200 mL/hr over 60 Minutes Intravenous  Once 10/31/22 0109 10/31/22 0117   10/31/22 0200  ceFEPIme (MAXIPIME) 2 g in sodium chloride 0.9 % 100 mL IVPB  Status:  Discontinued        2 g 200 mL/hr over 30 Minutes Intravenous  Once 10/31/22 0109 10/31/22 0117   10/30/22 2045  vancomycin (VANCOREADY) IVPB 2000 mg/400 mL        2,000 mg 200 mL/hr over 120 Minutes Intravenous  Once 10/30/22 2033 10/30/22 2319  10/30/22 2045  piperacillin-tazobactam (ZOSYN) IVPB 3.375 g        3.375 g 100 mL/hr over 30 Minutes Intravenous  Once 10/30/22 2033 10/30/22 2117           Data Reviewed:  I have personally reviewed the following...  CBC: Recent Labs  Lab 10/30/22 1742 10/31/22 0402  WBC 13.1* 11.8*  HGB 11.1* 10.6*  HCT 33.1* 31.7*  MCV 93.0 93.5  PLT 250 [id number]   Basic Metabolic Panel: Recent Labs  Lab 10/30/22 1742 10/31/22 0402 11/01/22 0459 11/03/22 0445  NA 135 136 133*  --   K 4.1 3.7 3.5  --   CL 103 107 103  --   CO2 19* 21* 21*  --   GLUCOSE 158* 153* 177*  --   BUN 40* 36* 36*  --   CREATININE 1.46* 1.18 1.18 1.14  CALCIUM 8.7* 8.5* 8.6*  --   MG  --  2.0  --   --   PHOS  --  3.6  --   --    GFR: Estimated Creatinine Clearance: 71.8 mL/min (by C-G formula based on SCr of 1.14 mg/dL). Liver Function Tests: Recent Labs  Lab 10/31/22 0402  AST 15  ALT 22  ALKPHOS 82  BILITOT 0.9  PROT 6.8  ALBUMIN 2.7*   No results for input(s): "LIPASE", "AMYLASE" in the last 168 hours. No results for input(s): "AMMONIA" in the last 168 hours. Coagulation Profile: No results for input(s): "INR", "PROTIME" in the last 168 hours. Cardiac Enzymes: No results for input(s): "CKTOTAL", "CKMB", "CKMBINDEX", "TROPONINI" in the last 168 hours. BNP (last 3 results) No results for input(s): "PROBNP" in the last  8760 hours. HbA1C: No results for input(s): "HGBA1C" in the last 72 hours.  CBG: Recent Labs  Lab 11/02/22 1138 11/02/22 1626 11/02/22 2145 11/03/22 0822 11/03/22 1114  GLUCAP 160* 174* 189* 191* 258*   Lipid Profile: No results for input(s): "CHOL", "HDL", "LDLCALC", "TRIG", "CHOLHDL", "LDLDIRECT" in the last 72 hours. Thyroid Function Tests: No results for input(s): "TSH", "T4TOTAL", "FREET4", "T3FREE", "THYROIDAB" in the last 72 hours. Anemia Panel: No results for input(s): "VITAMINB12", "FOLATE", "FERRITIN", "TIBC", "IRON", "RETICCTPCT" in the last 72 hours. Most Recent Urinalysis On File:     Component Value Date/Time   COLORURINE YELLOW (A) 08/10/2022 1840   APPEARANCEUR HAZY (A) 08/10/2022 1840   APPEARANCEUR Clear 07/05/2014 2040   LABSPEC 1.016 08/10/2022 1840   LABSPEC 1.030 07/05/2014 2040   PHURINE 6.0 08/10/2022 1840   GLUCOSEU NEGATIVE 08/10/2022 1840   GLUCOSEU >=500 07/05/2014 2040   HGBUR NEGATIVE 08/10/2022 1840   BILIRUBINUR NEGATIVE 08/10/2022 1840   BILIRUBINUR Negative 07/05/2014 2040   KETONESUR NEGATIVE 08/10/2022 1840   PROTEINUR NEGATIVE 08/10/2022 1840   NITRITE NEGATIVE 08/10/2022 1840   LEUKOCYTESUR MODERATE (A) 08/10/2022 1840   LEUKOCYTESUR Negative 07/05/2014 2040   Sepsis Labs: @LABRCNTIP$ (procalcitonin:4,lacticidven:4) Microbiology: Recent Results (from the past 240 hour(s))  Blood culture (routine x 2)     Status: None (Preliminary result)   Collection Time: 10/30/22  8:34 PM   Specimen: BLOOD  Result Value Ref Range Status   Specimen Description BLOOD LEFT ANTECUBITAL  Final   Special Requests   Final    BOTTLES DRAWN AEROBIC AND ANAEROBIC Blood Culture results may not be optimal due to an excessive volume of blood received in culture bottles   Culture   Final    NO GROWTH 4 DAYS Performed at [facility], [address] [place], [place]  [address]    Report Status PENDING  Incomplete  Blood culture (routine x 2)      Status: None (Preliminary result)   Collection Time: 10/30/22  8:34 PM   Specimen: BLOOD  Result Value Ref Range Status   Specimen Description BLOOD RIGHT ANTECUBITAL  Final   Special Requests   Final    BOTTLES DRAWN AEROBIC AND ANAEROBIC Blood Culture adequate volume   Culture   Final    NO GROWTH 4 DAYS Performed at [facility], [address], [place], [place] [address]    Report Status PENDING  Incomplete      Radiology Studies last 3 days: PERIPHERAL VASCULAR CATHETERIZATION  Result Date: 11/02/2022 See surgical note for result.  US ARTERIAL ABI (SCREENING LOWER EXTREMITY)  Result Date: 10/31/2022 CLINICAL DATA:  Right foot ulcer. EXAM: NONINVASIVE PHYSIOLOGIC VASCULAR STUDY OF BILATERAL LOWER EXTREMITIES TECHNIQUE: Evaluation of both lower extremities were performed at rest, including calculation of ankle-brachial indices with single level Doppler, pressure and pulse volume recording. COMPARISON:  None Available. FINDINGS: Right ABI:  1.12 Left ABI:  0.74 Right Lower Extremity: Monophasic posterior tibial and dorsalis pedis waveforms. Left Lower Extremity: Monophasic/near biphasic posterior tibial and dorsalis pedis waveforms. IMPRESSION: 1. Normal resting right ankle-brachial index with distal waveform abnormalities suggestive of some degree of tibial disease. 2. 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 Electronically Signed   By: Aletta Edouard M.D.   On: 10/31/2022 13:25   DG Foot Complete Right  Result Date: 10/30/2022 CLINICAL DATA:  Foot pain, swelling and fever. Concern for osteomyelitis. EXAM: RIGHT FOOT COMPLETE - 3+ VIEW COMPARISON:  No prior right foot exams. FINDINGS: The third toe alignment is distorted which limits assessment. Hammertoe deformity of the second and fourth toes. Osteoarthritis of the first metatarsal phalangeal joint. No fracture. No periostitis or frank  erosive change. Vascular calcifications are seen. Soft tissue edema overlies the dorsum of the foot. No soft tissue gas or radiopaque foreign body. IMPRESSION: 1. Soft tissue edema overlies the dorsum of the foot. No soft tissue gas or radiopaque foreign body. 2. No radiographic findings of osteomyelitis. Distorted third toe alignment which limits assessment. 3. Hammertoe deformity of the second and fourth toes. Electronically Signed   By: Keith Rake M.D.   On: 10/30/2022 20:56             LOS: 4 days    Emeterio Reeve, DO [facility] 11/03/2022, 11:40 AM    Dictation software may have been used to generate the above note. Typos may occur and escape review in typed/dictated notes. Please contact Dr Sheppard Coil directly for clarity if needed.  Staff may message me via secure chat in [place]  but this may not receive an immediate response,  please page me for urgent matters!  If 7PM-7AM, please contact night coverage [email or web address]

## 2022-11-03 NOTE — Progress Notes (Signed)
   PODIATRY PROGRESS NOTE  NAME Tyler [name] MRN [id number] DOB 09/28/1945 DOA 10/30/2022   Reason for consult:  Chief Complaint  Patient presents with   Foot Pain     History of present illness: 77 y.o. 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  He states is doing well.  He wants to know when is a surgical plan.  Denies any nausea fever chills  Vitals:   11/03/22 0349 11/03/22 0731  BP: (!) 144/66 (!) 157/80  Pulse: 79 77  Resp: 20 18  Temp: 98.6 F (37 C) 97.7 F (36.5 C)  SpO2: 100% 96%       Latest Ref Rng & Units 10/31/2022    4:02 AM 10/30/2022    5:42 PM 09/20/2022    1:09 AM  CBC  WBC 4.0 - 10.5 K/uL 11.8  13.1  5.3   Hemoglobin 13.0 - 17.0 g/dL 10.6  11.1  13.0   Hematocrit 39.0 - 52.0 % 31.7  33.1  40.3   Platelets 150 - 400 K/uL 240  250  157        Latest Ref Rng & Units 11/03/2022    4:45 AM 11/01/2022    4:59 AM 10/31/2022    4:02 AM  BMP  Glucose 70 - 99 mg/dL  177  153   BUN 8 - 23 mg/dL  36  36   Creatinine 0.61 - 1.24 mg/dL 1.14  1.18  1.18   Sodium 135 - 145 mmol/L  133  136   Potassium 3.5 - 5.1 mmol/L  3.5  3.7   Chloride 98 - 111 mmol/L  103  107   CO2 22 - 32 mmol/L  21  21   Calcium 8.9 - 10.3 mg/dL  8.6  8.5       Physical Exam: General: AAOx3, NAD  Dermatology: Ulceration noted as well as gangrenous changes of the right third toe.  Some purulence was noted from the digit.  There is no fluctuance or crepitation.  Vascular: Difficult to palpate pulses  Neurological: Sensation decreased  Musculoskeletal Exam: No pain on exam    ASSESSMENT/PLAN OF CARE  Gangrene third toe. -All questions and concerns were discussed with the patient in extensive detail -N.p.o. after midnight -Patient is scheduled for surgery tomorrow for right third digit amputation -Weightbearing as tolerated with surgical to shoe after the surgery -Betadine wet-to-dry dressing     

## 2022-11-03 NOTE — H&P (View-Only) (Signed)
   PODIATRY PROGRESS NOTE  NAME Tyler [name] MRN [id number] DOB 05/21/1946 DOA 10/30/2022   Reason for consult:  Chief Complaint  Patient presents with   Foot Pain     History of present illness: 77 y.o. 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  He states is doing well.  He wants to know when is a surgical plan.  Denies any nausea fever chills  Vitals:   11/03/22 0349 11/03/22 0731  BP: (!) 144/66 (!) 157/80  Pulse: 79 77  Resp: 20 18  Temp: 98.6 F (37 C) 97.7 F (36.5 C)  SpO2: 100% 96%       Latest Ref Rng & Units 10/31/2022    4:02 AM 10/30/2022    5:42 PM 09/20/2022    1:09 AM  CBC  WBC 4.0 - 10.5 K/uL 11.8  13.1  5.3   Hemoglobin 13.0 - 17.0 g/dL 10.6  11.1  13.0   Hematocrit 39.0 - 52.0 % 31.7  33.1  40.3   Platelets 150 - 400 K/uL 240  250  157        Latest Ref Rng & Units 11/03/2022    4:45 AM 11/01/2022    4:59 AM 10/31/2022    4:02 AM  BMP  Glucose 70 - 99 mg/dL  177  153   BUN 8 - 23 mg/dL  36  36   Creatinine 0.61 - 1.24 mg/dL 1.14  1.18  1.18   Sodium 135 - 145 mmol/L  133  136   Potassium 3.5 - 5.1 mmol/L  3.5  3.7   Chloride 98 - 111 mmol/L  103  107   CO2 22 - 32 mmol/L  21  21   Calcium 8.9 - 10.3 mg/dL  8.6  8.5       Physical Exam: General: AAOx3, NAD  Dermatology: Ulceration noted as well as gangrenous changes of the right third toe.  Some purulence was noted from the digit.  There is no fluctuance or crepitation.  Vascular: Difficult to palpate pulses  Neurological: Sensation decreased  Musculoskeletal Exam: No pain on exam    ASSESSMENT/PLAN OF CARE  Gangrene third toe. -All questions and concerns were discussed with the patient in extensive detail -N.p.o. after midnight -Patient is scheduled for surgery tomorrow for right third digit amputation -Weightbearing as tolerated with surgical to shoe after the surgery -Betadine wet-to-dry dressing

## 2022-11-03 NOTE — Progress Notes (Signed)
Pharmacy Antibiotic Note  Tyler [name] is a 77 y.o. male admitted on 10/30/2022 with cellulitis.  Pharmacy has been consulted for Cefepime & Vancomycin dosing for 7 days.  Calculated PK parameters based on vanc peak and trough AUC 509.3, Cmin 13.1, Ke 0.0396  Plan: Continue Cefepime 2 gm q8hr Continue vancomycin  1500 mg IV every 24 hours Goal AUC 400-600  Follow levels and renal function for adjustments Pharmacy will continue to follow and will adjust abx dosing whenever warranted.  Temp (24hrs), Avg:98.6 F (37 C), Min:97.7 F (36.5 C), Max:99.3 F (37.4 C)   Recent Labs  Lab 10/30/22 1742 10/30/22 2034 10/31/22 0402 11/01/22 0459 11/02/22 2117 11/03/22 0445 11/03/22 1722  WBC 13.1*  --  11.8*  --   --   --   --   CREATININE 1.46*  --  1.18 1.18  --  1.14  --   LATICACIDVEN 1.0 0.8  --   --   --   --   --   VANCOTROUGH  --   --   --   --   --   --  14*  VANCOPEAK  --   --   --   --  31  --   --      Estimated Creatinine Clearance: 71.8 mL/min (by C-G formula based on SCr of 1.14 mg/dL).    No Known Allergies  Antimicrobials this admission: 2/6 Zosyn >> x 1 dose 2/6 Vancomycin >> x 7 days 2/7 Cefepime >> x 7 days  Microbiology results: 2/06 BCx: ngtd  Thank you for allowing pharmacy to be a part of this patient's care.  Tollie Eth III, PharmD 11/03/2022 6:36 PM

## 2022-11-04 ENCOUNTER — Inpatient Hospital Stay: Payer: BC Managed Care – PPO | Admitting: Anesthesiology

## 2022-11-04 ENCOUNTER — Inpatient Hospital Stay: Payer: BC Managed Care – PPO

## 2022-11-04 ENCOUNTER — Other Ambulatory Visit: Payer: Self-pay

## 2022-11-04 ENCOUNTER — Encounter
Admission: EM | Disposition: A | Discharge status: Home / Self Care | Payer: Self-pay | Source: Home / Self Care | Attending: Osteopathic Medicine

## 2022-11-04 DIAGNOSIS — L089 Local infection of the skin and subcutaneous tissue, unspecified: Secondary | ICD-10-CM | POA: Diagnosis not present

## 2022-11-04 DIAGNOSIS — M79671 Pain in right foot: Secondary | ICD-10-CM | POA: Diagnosis not present

## 2022-11-04 DIAGNOSIS — I96 Gangrene, not elsewhere classified: Secondary | ICD-10-CM | POA: Diagnosis not present

## 2022-11-04 DIAGNOSIS — E11628 Type 2 diabetes mellitus with other skin complications: Secondary | ICD-10-CM | POA: Diagnosis not present

## 2022-11-04 HISTORY — PX: AMPUTATION TOE: SHX6595

## 2022-11-04 LAB — GLUCOSE, CAPILLARY
Glucose-Capillary: 168 mg/dL — ABNORMAL HIGH (ref 70–99)
Glucose-Capillary: 171 mg/dL — ABNORMAL HIGH (ref 70–99)
Glucose-Capillary: 165 mg/dL — ABNORMAL HIGH (ref 70–99)
Glucose-Capillary: 217 mg/dL — ABNORMAL HIGH (ref 70–99)

## 2022-11-04 LAB — CULTURE, BLOOD (ROUTINE X 2)
Culture: NO GROWTH
Culture: NO GROWTH
Special Requests: ADEQUATE

## 2022-11-04 SURGERY — AMPUTATION, TOE
Anesthesia: General | Site: Toe | Laterality: Right

## 2022-11-04 MED ORDER — PROPOFOL 500 MG/50ML IV EMUL
INTRAVENOUS | Status: DC | PRN
  Administered 2022-11-04: 20 mg via INTRAVENOUS
  Administered 2022-11-04: 180 ug/kg/min via INTRAVENOUS
  Administered 2022-11-04: 20 mg via INTRAVENOUS

## 2022-11-04 MED ORDER — OXYMETAZOLINE HCL 0.05 % NA SOLN: 1.0000 | Freq: Two times a day (BID) | NASAL | Status: DC | PRN

## 2022-11-04 MED ORDER — OXYMETAZOLINE HCL 0.05 % NA SOLN
3.0000 | Freq: Once | NASAL | Status: AC
  Administered 2022-11-04: 3 via NASAL
  Filled 2022-11-04: qty 15

## 2022-11-04 MED ORDER — PROPOFOL 10 MG/ML IV BOLUS
INTRAVENOUS | Status: AC
  Filled 2022-11-04: qty 20

## 2022-11-04 MED ORDER — OXYCODONE HCL 5 MG PO TABS
5.0000 mg | ORAL_TABLET | Freq: Once | ORAL | Status: AC | PRN
  Administered 2022-11-04: 5 mg via ORAL

## 2022-11-04 MED ORDER — 0.9 % SODIUM CHLORIDE (POUR BTL) OPTIME
TOPICAL | Status: DC | PRN
  Administered 2022-11-04: 500 mL

## 2022-11-04 MED ORDER — LIDOCAINE HCL (CARDIAC) PF 100 MG/5ML IV SOSY
PREFILLED_SYRINGE | INTRAVENOUS | Status: DC | PRN
  Administered 2022-11-04: 100 mg via INTRAVENOUS

## 2022-11-04 MED ORDER — OXYCODONE HCL 5 MG PO TABS
ORAL_TABLET | ORAL | Status: AC
  Filled 2022-11-04: qty 1

## 2022-11-04 MED ORDER — FENTANYL CITRATE (PF) 100 MCG/2ML IJ SOLN: 25.0000 ug | INTRAMUSCULAR | Status: DC | PRN

## 2022-11-04 MED ORDER — ONDANSETRON HCL 4 MG/2ML IJ SOLN
INTRAMUSCULAR | Status: AC
  Filled 2022-11-04: qty 2

## 2022-11-04 MED ORDER — SODIUM CHLORIDE 0.9 % IV SOLN: INTRAVENOUS | Status: DC | PRN

## 2022-11-04 MED ORDER — OXYMETAZOLINE HCL 0.05 % NA SOLN
1.0000 | Freq: Two times a day (BID) | NASAL | Status: DC | PRN
  Administered 2022-11-04 – 2022-11-05 (×2): 1 via NASAL

## 2022-11-04 MED ORDER — ONDANSETRON HCL 4 MG/2ML IJ SOLN
INTRAMUSCULAR | Status: DC | PRN
  Administered 2022-11-04: 4 mg via INTRAVENOUS

## 2022-11-04 MED ORDER — OXYCODONE HCL 5 MG/5ML PO SOLN: 5.0000 mg | Freq: Once | ORAL | Status: AC | PRN

## 2022-11-04 MED ORDER — PROPOFOL 1000 MG/100ML IV EMUL
INTRAVENOUS | Status: AC
  Filled 2022-11-04: qty 100

## 2022-11-04 MED ORDER — PHENYLEPHRINE 80 MCG/ML (10ML) SYRINGE FOR IV PUSH (FOR BLOOD PRESSURE SUPPORT)
PREFILLED_SYRINGE | INTRAVENOUS | Status: AC
  Filled 2022-11-04: qty 10

## 2022-11-04 MED ORDER — LIDOCAINE HCL (PF) 2 % IJ SOLN
INTRAMUSCULAR | Status: AC
  Filled 2022-11-04: qty 5

## 2022-11-04 MED ORDER — ACETAMINOPHEN 10 MG/ML IV SOLN
INTRAVENOUS | Status: AC
  Filled 2022-11-04: qty 100

## 2022-11-04 MED ORDER — TRANEXAMIC ACID 1000 MG/10ML IV SOLN
500.0000 mg | Freq: Once | INTRAVENOUS | Status: AC
  Administered 2022-11-05: 500 mg via TOPICAL
  Filled 2022-11-04: qty 10

## 2022-11-04 MED ORDER — ACETAMINOPHEN 10 MG/ML IV SOLN
INTRAVENOUS | Status: DC | PRN
  Administered 2022-11-04: 1000 mg via INTRAVENOUS

## 2022-11-04 MED ORDER — NEOMYCIN-POLYMYXIN B GU 40-200000 IR SOLN
Status: DC | PRN
  Administered 2022-11-04: 2 mL

## 2022-11-04 MED ORDER — BUPIVACAINE HCL 0.5 % IJ SOLN
INTRAMUSCULAR | Status: DC | PRN
  Administered 2022-11-04: 5 mL

## 2022-11-04 MED ORDER — LIDOCAINE HCL (PF) 1 % IJ SOLN
INTRAMUSCULAR | Status: DC | PRN
  Administered 2022-11-04: 5 mL

## 2022-11-04 SURGICAL SUPPLY — 56 items
BLADE MED AGGRESSIVE (BLADE) ×1 IMPLANT
BLADE OSC/SAGITTAL MD 5.5X18 (BLADE) IMPLANT
BLADE OSCILLATING/SAGITTAL (BLADE) ×1
BLADE SURG 15 STRL LF DISP TIS (BLADE) IMPLANT
BLADE SURG 15 STRL SS (BLADE) ×1
BLADE SURG MINI STRL (BLADE) IMPLANT
BLADE SW THK.38XMED LNG THN (BLADE) IMPLANT
BNDG ELASTIC 4X5.8 VLCR STR LF (GAUZE/BANDAGES/DRESSINGS) ×1 IMPLANT
BNDG ESMARCH 4 X 12 STRL LF (GAUZE/BANDAGES/DRESSINGS) ×1
BNDG ESMARCH 4X12 STRL LF (GAUZE/BANDAGES/DRESSINGS) ×1 IMPLANT
BNDG GAUZE DERMACEA FLUFF 4 (GAUZE/BANDAGES/DRESSINGS) ×1 IMPLANT
BNDG STRETCH 4X75 STRL LF (GAUZE/BANDAGES/DRESSINGS) IMPLANT
CNTNR URN SCR LID CUP LEK RST (MISCELLANEOUS) ×1 IMPLANT
CONT SPEC 4OZ STRL OR WHT (MISCELLANEOUS) ×1
CUFF TOURN SGL QUICK 12 (TOURNIQUET CUFF) IMPLANT
CUFF TOURN SGL QUICK 18X4 (TOURNIQUET CUFF) IMPLANT
CUFF TOURN SGL QUICK 24 (TOURNIQUET CUFF) ×1
CUFF TRNQT CYL 24X4X40X1 (TOURNIQUET CUFF) IMPLANT
DRAPE FLUOR MINI C-ARM 54X84 (DRAPES) IMPLANT
DRSG XEROFORM 1X8 (GAUZE/BANDAGES/DRESSINGS) IMPLANT
DURAPREP 26ML APPLICATOR (WOUND CARE) ×1 IMPLANT
ELECT REM PT RETURN 9FT ADLT (ELECTROSURGICAL) ×1
ELECTRODE REM PT RTRN 9FT ADLT (ELECTROSURGICAL) ×1 IMPLANT
GAUZE SPONGE 4X4 12PLY STRL (GAUZE/BANDAGES/DRESSINGS) ×2 IMPLANT
GAUZE STRETCH 2X75IN STRL (MISCELLANEOUS) IMPLANT
GAUZE XEROFORM 1X8 LF (GAUZE/BANDAGES/DRESSINGS) ×1 IMPLANT
GLOVE BIO SURGEON STRL SZ8 (GLOVE) ×1 IMPLANT
GLOVE BIOGEL PI IND STRL 8 (GLOVE) ×1 IMPLANT
GOWN STRL REUS W/ TWL LRG LVL3 (GOWN DISPOSABLE) ×2 IMPLANT
GOWN STRL REUS W/TWL LRG LVL3 (GOWN DISPOSABLE) ×2
HANDPIECE VERSAJET DEBRIDEMENT (MISCELLANEOUS) IMPLANT
IV NS IRRIG 3000ML ARTHROMATIC (IV SOLUTION) IMPLANT
KIT TURNOVER KIT A (KITS) ×1 IMPLANT
LABEL OR SOLS (LABEL) ×1 IMPLANT
MANIFOLD NEPTUNE II (INSTRUMENTS) ×1 IMPLANT
MICROMATRIX 1000MG (Tissue) ×1 IMPLANT
NDL FILTER BLUNT 18X1 1/2 (NEEDLE) ×1 IMPLANT
NDL HYPO 25X1 1.5 SAFETY (NEEDLE) ×2 IMPLANT
NEEDLE FILTER BLUNT 18X1 1/2 (NEEDLE) ×1 IMPLANT
NEEDLE HYPO 25X1 1.5 SAFETY (NEEDLE) ×2 IMPLANT
NS IRRIG 500ML POUR BTL (IV SOLUTION) ×1 IMPLANT
PACK EXTREMITY ARMC (MISCELLANEOUS) ×1 IMPLANT
PAD ABD DERMACEA PRESS 5X9 (GAUZE/BANDAGES/DRESSINGS) IMPLANT
SOL PREP PVP 2OZ (MISCELLANEOUS) ×2
SOLUTION PARTIC MCRMTRX 1000MG (Tissue) IMPLANT
SOLUTION PREP PVP 2OZ (MISCELLANEOUS) ×1 IMPLANT
STOCKINETTE STRL 6IN 960660 (GAUZE/BANDAGES/DRESSINGS) ×1 IMPLANT
SUT ETHILON 3-0 FS-10 30 BLK (SUTURE) ×2
SUT PROLENE 3 0 PS 2 (SUTURE) IMPLANT
SUT VIC AB 3-0 SH 27 (SUTURE) ×1
SUT VIC AB 3-0 SH 27X BRD (SUTURE) ×1 IMPLANT
SUTURE EHLN 3-0 FS-10 30 BLK (SUTURE) ×2 IMPLANT
SWAB CULTURE AMIES ANAERIB BLU (MISCELLANEOUS) IMPLANT
SYR 10ML LL (SYRINGE) ×1 IMPLANT
TRAP FLUID SMOKE EVACUATOR (MISCELLANEOUS) ×1 IMPLANT
WATER STERILE IRR 500ML POUR (IV SOLUTION) ×1 IMPLANT

## 2022-11-04 NOTE — Op Note (Signed)
OPERATIVE REPORT Patient name: Tyler [name] MRN: [id number] DOB: [birth year]  DOS: 11/04/22  Preop Dx: Osteomyelitis right third toe.  Cellulitis right foot Postop Dx: same  Procedure:  1.  Amputation right third toe  Surgeon: Edrick Kins [name]  Anesthesia: MAC with 50-50 mixture of 2% lidocaine plain with 0.5% Marcaine plain totaling 10 mL infiltrated in the patient's right forefoot  Hemostasis: Calf tourniquet inflated to a pressure of 210mHg without esmarch exsanguination   EBL: 20 mL Materials: [employer] Micromatrix 1 g Injectables: None Pathology: Soft tissue culture right foot.  Proximal phalanx third digit right foot sent for BOTH culture and path  Condition: The patient tolerated the procedure and anesthesia well. No complications noted or reported   Justification for procedure: The patient is a 77y.o. male who presents today for surgical correction of osteomyelitis to the right third toe and cellulitis of the forefoot. The patient was told benefits as well as possible side effects of the surgery. The patient consented for surgical correction. The patient consent form was reviewed. All patient questions were answered. No guarantees were expressed or implied. The patient and the surgeon both signed the patient consent form with the witness present and placed in the patient's chart.   Procedure in Detail: The patient was brought to the operating room, remained in the hospital bed in the supine position at which time an aseptic scrub and drape were performed about the patient's respective lower extremity after anesthesia was induced as described above. Attention was then directed to the surgical area where procedure number one commenced.  Procedure #1: Amputation right third toe A racquet type elliptical incision was planned and made around the third MTP of the right foot.  Incision was carried straight down to the level of the third MTP using a #10 scalpel.  A  perforating towel clamp was utilized to grasp the third digit and the digit was disarticulated at the level of the MTP.  It was removed in toto and placed in sterile specimen container.  The proximal base of the proximal phalanx was removed and a portion was sent for culture as well as pathology.  Soft tissue culture was also taken at the amputation site. Any necrotic nonviable tissue was sharply debrided away using a #15 scalpel.  Minimal purulence noted.  Adequate perfusion noted along the amputation site.  Intraoperatively there is no concern for vascular compromise.  Any exposed tendons were distracted distally and cut as far proximal as could be visualized.  Copious irrigation was then utilized in preparation for primary closure.  Prior to primary closure Inte[employer] Micromatrix 1g powder was placed within the amputation site and primary closure was achieved using 3-0 Prolene suture. After primary closure of the amputation site there is noticeable hammertoe contracture of the second digit with a preulcerative callus/superficial eschar to the distal tip of the toe.  Evaluating the foot intraoperatively there is concerned that the distal tip of the toe will begin to ulcerate with potential for additional amputation.  Decision was made to make a percutaneous small incision over the plantar sulcus of the second toe and the flexor tendon was released to elevate the toe and offload pressure from the distal tip of the toe to prevent ulcer to the adjacent digit.  A simple 3-0 Prolene suture was applied to the percutaneous incision.  Betadine soaked wet-to-dry sterile compressive dressings were then applied to all previously mentioned incision sites about the patient's lower extremity. The tourniquet which  was used for hemostasis was deflated. All normal neurovascular responses including pink color and warmth returned all the remaining digits of patient's lower extremity.  The patient was then transferred  from the operating room to the recovery room having tolerated the procedure and anesthesia well. All vital signs are stable. After a brief stay in the recovery room the patient was readmitted to inpatient room with postoperative orders placed.    IMPRESSION: Adequate perfusion.  Minimal necrotic tissue or purulence.  Good potential for healing.  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  The distal portion of the metatarsal however appeared viable with good integrity.  Edrick Kins, [name] [facility]  Dr. Edrick Kins, [name]    [address], [place] [address]                Office [phone]  Fax [phone]

## 2022-11-04 NOTE — Brief Op Note (Signed)
11/04/2022  9:38 AM  PATIENT:  Tyler [name]  77 y.o. 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 toe  PROCEDURE:  Procedure(s): AMPUTATION TOE (Right)  SURGEON:  Surgeon(s) and Role:    Edrick Kins, [name] - Primary  PHYSICIAN ASSISTANT: none  ASSISTANTS: none   ANESTHESIA:   local and MAC  EBL:  64m   BLOOD ADMINISTERED:none  DRAINS: none   LOCAL MEDICATIONS USED:  MARCAINE   , LIDOCAINE , and Amount: 10 ml  SPECIMEN:  Source of Specimen:  Soft tissue culture,  Proximal phalanx bone 3rd toe sent for BOTH Culture and Path  DISPOSITION OF SPECIMEN:  PATHOLOGY  COUNTS:  YES  TOURNIQUET:   Total Tourniquet Time Documented: Calf (Right) - 32 minutes Total: Calf (Right) - 32 minutes   DICTATION: .DViviann SpareDictation  PLAN OF CARE: Admit to inpatient   PATIENT DISPOSITION:  PACU - hemodynamically stable.   Delay start of Pharmacological VTE agent (>24hrs) due to surgical blood loss or risk of bleeding: not applicable  BEdrick Kins [name] [facility]  Dr. BEdrick Kins [name]    [address] [place] [address]               Office [phone] Fax [phone]

## 2022-11-04 NOTE — Transfer of Care (Addendum)
Immediate Anesthesia Transfer of Care Note  Patient: Tyler [name]  Procedure(s) Performed: AMPUTATION TOE (Right: Toe)  Patient Location: PACU  Anesthesia Type:MAC  Level of Consciousness: awake, alert , oriented, and patient cooperative  Airway & Oxygen Therapy: Patient Spontanous Breathing  Post-op Assessment: Report given to RN and Patient moving all extremities X 4  Post vital signs: stable  Last Vitals:  Vitals Value Taken Time  BP 107/56   Temp 98.23F   Pulse 79 11/04/22 0939  Resp 25 11/04/22 0939  SpO2 94 % 11/04/22 0939  Vitals shown include unvalidated device data.  Last Pain:  Vitals:   11/04/22 0749  TempSrc:   PainSc: 0-No pain      Patients Stated Pain Goal: 3 ([id number])  Complications: No notable events documented.

## 2022-11-04 NOTE — Anesthesia Preprocedure Evaluation (Addendum)
Anesthesia Evaluation  Patient identified by MRN, date of birth, ID band Patient awake    Reviewed: Allergy & Precautions, NPO status , Patient's Chart, lab work & pertinent test results  History of Anesthesia Complications Negative for: history of anesthetic complications  Airway Mallampati: III  TM Distance: >3 FB Neck ROM: full    Dental  (+) Poor Dentition   Pulmonary neg pulmonary ROS   Pulmonary exam normal        Cardiovascular Exercise Tolerance: Poor hypertension, On Medications and On Home Beta Blockers + Peripheral Vascular Disease and +CHF  + dysrhythmias (SSSs/p pacemaker) Atrial Fibrillation + pacemaker   Left Ventricle: EF: 55-60%.    Mitral Valve: There is moderate annular calcification.    Mitral Valve: There is 3+ tricuspid regurgitation.     Neuro/Psych negative neurological ROS  negative psych ROS   GI/Hepatic negative GI ROS, Neg liver ROS,,,  Endo/Other  diabetes, Poorly Controlled, Type 2, Oral Hypoglycemic Agents    Renal/GU Renal disease   BPH with indwelling foley     Musculoskeletal  (+) Arthritis ,    Abdominal   Peds  Hematology negative hematology ROS (+)   Anesthesia Other Findings Past Medical History: No date: A-fib ([employer]) No date: Arthritis No date: Diabetes mellitus without complication (HCC) No date: Presence of permanent cardiac pacemaker No date: Sick sinus syndrome (HCC) No date: Urinary retention     Comment:  chronic, with indwelling Foley catheter and plans for a               suprapubic No date: Wears dentures     Comment:  full upper  Past Surgical History: 01/16/2017: CATARACT EXTRACTION W/PHACO; Right     Comment:  Procedure: CATARACT EXTRACTION PHACO AND INTRAOCULAR               LENS PLACEMENT ([place])  Right Complicated;  Surgeon:               Leandrew Koyanagi, MD;  Location: [place];               Service: Ophthalmology;  Laterality:  Right;  IVA               Block Healon 5 malyugin vision blue Diabetic - oral               meds 10/23/2022: CATARACT EXTRACTION W/PHACO; Left     Comment:  Procedure: CATARACT EXTRACTION PHACO AND INTRAOCULAR               LENS PLACEMENT ([place]) LEFT DIABETIC;  [name], MD;  Location: [place];  Service:               Ophthalmology;  Laterality: Left;  Diabetic 09/15/2020: INSERT / REPLACE / REMOVE PACEMAKER     Comment:  [employer] [id number] 07/05/2018: METATARSAL HEAD EXCISION; Left     Comment:  Procedure: RESECTION FIRST METATARSAL INFECTED BONE AND               SOFT TISSUE;  [name], [name];  Location:               [facility];  Service: Podiatry;  Laterality: Left; No date: TOE AMPUTATION; Left No date: TONSILLECTOMY  BMI    Body Mass Index: 32.32 kg/m      Reproductive/Obstetrics negative OB ROS  Anesthesia Physical Anesthesia Plan  ASA: 3  Anesthesia Plan: General   Post-op Pain Management: Ofirmev IV (intra-op)*   Induction: Intravenous  PONV Risk Score and Plan: Propofol infusion and TIVA  Airway Management Planned: Natural Airway and Nasal Cannula  Additional Equipment:   Intra-op Plan:   Post-operative Plan:   Informed Consent: I have reviewed the patients History and Physical, chart, labs and discussed the procedure including the risks, benefits and alternatives for the proposed anesthesia with the patient or authorized representative who has indicated his/her understanding and acceptance.     Dental Advisory Given  Plan Discussed with: Anesthesiologist, CRNA and Surgeon  Anesthesia Plan Comments: (Patient consented for risks of anesthesia including but not limited to:  - adverse reactions to medications - risk of airway placement if required - damage to eyes, teeth, lips or other oral mucosa - nerve damage due to positioning  - sore throat  or hoarseness - Damage to heart, brain, nerves, lungs, other parts of body or loss of life  Patient voiced understanding.)       Anesthesia Quick Evaluation

## 2022-11-04 NOTE — Progress Notes (Signed)
Supply chain currently does not have large post-op shoes in stock.

## 2022-11-04 NOTE — Progress Notes (Signed)
       CROSS COVER NOTE  NAME: Tyler [name] MRN: [id number] DOB : [birth year] ATTENDING PHYSICIAN: Emeterio Reeve, DO    Date of Service   11/04/2022   HPI/Events of Note   Message received from RN reporting ongoing nose bleed. Requested RN hold pressure for 10 mins and the trickling of blood decreased but still present.  At bedside Tyler [name] reports he has had a nosebleed since returning from his procedure today. Large clot visuzlied with removal of gauze from (L) nare.  Interventions   Assessment/Plan: Afrin PRN Tranexamic Acid + Rhino rocket Packing Abx if packing remains BP control Hold Heparin tonight       To reach the provider On-Call:   7AM- 7PM see care teams to locate the attending and reach out to them via [email or web address] 7PM-7AM contact night-coverage If you still have difficulty reaching the appropriate provider, please page the [facility] (Director on Call) for [facility] on amion for assistance  This document was prepared using Systems analyst and may include unintentional dictation errors.  Neomia Glass DNP, MBA, FNP-BC, PMHNP-BC Nurse Practitioner [facility] Pager [phone]

## 2022-11-04 NOTE — Progress Notes (Signed)
PROGRESS NOTE    Tyler [name]   [id number] DOB: [birth year]  DOA: 10/30/2022 Date of Service: 11/04/22 PCP: Rhetta Mura., MD     Brief Narrative / Hospital Course:  Tyler [name] is a 77 y.o. male with medical history significant of type 2 diabetes mellitus, atrial fibrillation who presents to the emergency department 10/30/22 due to infection of right third toe.  He states that he noted his right third toe turning dark few days ago and complained of pain in the affected area as well as fever.  02/06: Right foot x-ray showed soft tissue edema overlies the dorsum of the foot. No soft tissue gas or radiopaque foreign body. No radiographic findings of osteomyelitis. Started on IV vancomycin and Zosyn. Admitted to hospitalist service. Podiatry to see tomorrow per H&P.  02/07: ABI abn, vascular planning angio Thu 02/08 or Fri 02/09, podiatry planning amputation toe Sat/Sun 02/08: RLE angiography tomorrow 02/09: RLE angiography today, see below. Podiatry plans at least toe amputation this weekend (today is Friday) 02/10: stable, amputation planned tomorrow 02/11: post amputation R 3rd toe, stable, podiatry recs keep at least until tomorrow to monitor surgical wound. Metatarsal appeared clear of infection.    Consultants:  Podiatry  Vascular surgery   Procedures: RLE angiogram and balloon angioplasty R anterior tibial A, R popliteal A, R superficial femoral A, stent placement to SFA 11/02/22       ASSESSMENT & PLAN:   Principal Problem:   Right foot infection Active Problems:   Cellulitis of right foot   Type 2 diabetes mellitus with complication, without long-term current use of insulin (HCC)   Persistent atrial fibrillation (HCC)   Essential hypertension   CKD (chronic kidney disease), stage III (HCC)   Obesity (BMI 30-39.9)  Right foot infection with superimposed cellulitis PVD continue vancomycin and cefepime S/p angiography/angioplasty RLE 11/02/22   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    Type 2 diabetes mellitus Continue ISS and hypoglycemia protocol Glipizide and metformin held at this time   Persistent atrial fibrillation Continue Coreg - increased dose d/t HTN Patient was not on any anticoagulant PTA per med rec   Essential hypertension (uncontrolled) Continue lisinopril and Coreg - increased doses d/t HTN    CKD stage 3A AKI - resolved Restarted home dose lasix po  Monitor BMP   Obesity (BMI 32.55) Diet and lifestyle modification     DVT prophylaxis: SCD Pertinent IV fluids/nutrition: no continuous IV fluids  Central lines / invasive devices: none  Code Status: FULL CODE   Current Admission Status: inpatient  TOC needs / Dispo plan: none at this time, pend PT/OT eval after toe amputation, expect he may benefit from [facility] PT or outpatient PT Barriers to discharge / significant pending items: podiatry and vascular recs, IV abx for now, toe amputation today and hopefully discharge next 1-2 days per podiatry recs, confirm if will need continued IV abx or can transition to po or d/c abx              Subjective / Brief ROS:  Patient reports pain is controlled  Denies CP/SOB.  No other concerns from pt or RN   Family Communication: none at this time     Objective Findings:  Vitals:   11/04/22 1000 11/04/22 1021 11/04/22 1053 11/04/22 1205  BP: (!) 121/52 (!) 120/58 130/61 119/71  Pulse: 77 77 78 73  Resp: (!) 24 (!) 21 20 16  $ Temp:  98 F (36.7 C) 98.1  F (36.7 C) 98.2 F (36.8 C)  TempSrc:  Oral Oral   SpO2: 95% 96% 92% 95%  Weight:      Height:        Intake/Output Summary (Last 24 hours) at 11/04/2022 1243 Last data filed at 11/04/2022 [id number] Gross per 24 hour  Intake 842.13 ml  Output 1552 ml  Net -709.87 ml   Filed Weights   11/02/22 0357  Weight: 114.2 kg    Examination:  Physical Exam Constitutional:      General: He is not in acute distress.    Appearance: Normal  appearance. He is not ill-appearing.  Pulmonary:     Effort: Pulmonary effort is normal.  Musculoskeletal:     Right lower leg: Edema (improved from previous exams) present.  Neurological:     Mental Status: He is alert.          Scheduled Medications:   aspirin EC  81 mg Oral Daily   atorvastatin  20 mg Oral Daily   carvedilol  6.25 mg Oral BID   Chlorhexidine Gluconate Cloth  6 each Topical Once   Chlorhexidine Gluconate Cloth  6 each Topical Q0600   clopidogrel  75 mg Oral Daily   furosemide  40 mg Oral Daily   heparin injection (subcutaneous)  5,000 Units Subcutaneous Q8H   insulin aspart  0-15 Units Subcutaneous TID WC   lisinopril  40 mg Oral Daily   oxyCODONE        Continuous Infusions:  sodium chloride Stopped (11/03/22 1630)   ceFEPime (MAXIPIME) IV 2 g (11/04/22 0510)   vancomycin 1,500 mg (11/03/22 1856)    PRN Medications:  sodium chloride, acetaminophen **OR** acetaminophen, fentaNYL (SUBLIMAZE) injection, guaiFENesin-dextromethorphan, HYDROmorphone (DILAUDID) injection, menthol-cetylpyridinium, ondansetron **OR** ondansetron (ZOFRAN) IV, ondansetron (ZOFRAN) IV, oxyCODONE, sodium chloride  Antimicrobials from admission:  Anti-infectives (From admission, onward)    Start     Dose/Rate Route Frequency Ordered Stop   11/01/22 1925  ceFAZolin (ANCEF) IVPB 2g/100 mL premix        2 g 200 mL/hr over 30 Minutes Intravenous 30 min pre-op 11/01/22 1925 11/02/22 0852   10/31/22 1800  vancomycin (VANCOREADY) IVPB 1750 mg/350 mL  Status:  Discontinued        1,750 mg 175 mL/hr over 120 Minutes Intravenous Every 24 hours 10/31/22 0117 10/31/22 0759   10/31/22 1800  vancomycin (VANCOREADY) IVPB 1500 mg/300 mL        1,500 mg 150 mL/hr over 120 Minutes Intravenous Every 24 hours 10/31/22 0759 11/07/22 1759   10/31/22 1300  ceFEPIme (MAXIPIME) 2 g in sodium chloride 0.9 % 100 mL IVPB        2 g 200 mL/hr over 30 Minutes Intravenous Every 8 hours 10/31/22 0759  11/07/22 0459   10/31/22 0400  ceFEPIme (MAXIPIME) 2 g in sodium chloride 0.9 % 100 mL IVPB  Status:  Discontinued        2 g 200 mL/hr over 30 Minutes Intravenous Every 12 hours 10/31/22 0117 10/31/22 0759   10/31/22 0200  vancomycin (VANCOCIN) IVPB 1000 mg/200 mL premix  Status:  Discontinued        1,000 mg 200 mL/hr over 60 Minutes Intravenous  Once 10/31/22 0109 10/31/22 0117   10/31/22 0200  ceFEPIme (MAXIPIME) 2 g in sodium chloride 0.9 % 100 mL IVPB  Status:  Discontinued        2 g 200 mL/hr over 30 Minutes Intravenous  Once 10/31/22 0109 10/31/22 0117   10/30/22 2045  vancomycin (VANCOREADY) IVPB 2000 mg/400 mL        2,000 mg 200 mL/hr over 120 Minutes Intravenous  Once 10/30/22 2033 10/30/22 2319   10/30/22 2045  piperacillin-tazobactam (ZOSYN) IVPB 3.375 g        3.375 g 100 mL/hr over 30 Minutes Intravenous  Once 10/30/22 2033 10/30/22 2117           Data Reviewed:  I have personally reviewed the following...  CBC: Recent Labs  Lab 10/30/22 1742 10/31/22 0402  WBC 13.1* 11.8*  HGB 11.1* 10.6*  HCT 33.1* 31.7*  MCV 93.0 93.5  PLT 250 [id number]   Basic Metabolic Panel: Recent Labs  Lab 10/30/22 1742 10/31/22 0402 11/01/22 0459 11/03/22 0445  NA 135 136 133*  --   K 4.1 3.7 3.5  --   CL 103 107 103  --   CO2 19* 21* 21*  --   GLUCOSE 158* 153* 177*  --   BUN 40* 36* 36*  --   CREATININE 1.46* 1.18 1.18 1.14  CALCIUM 8.7* 8.5* 8.6*  --   MG  --  2.0  --   --   PHOS  --  3.6  --   --    GFR: Estimated Creatinine Clearance: 71.8 mL/min (by C-G formula based on SCr of 1.14 mg/dL). Liver Function Tests: Recent Labs  Lab 10/31/22 0402  AST 15  ALT 22  ALKPHOS 82  BILITOT 0.9  PROT 6.8  ALBUMIN 2.7*   No results for input(s): "LIPASE", "AMYLASE" in the last 168 hours. No results for input(s): "AMMONIA" in the last 168 hours. Coagulation Profile: No results for input(s): "INR", "PROTIME" in the last 168 hours. Cardiac Enzymes: No results for  input(s): "CKTOTAL", "CKMB", "CKMBINDEX", "TROPONINI" in the last 168 hours. BNP (last 3 results) No results for input(s): "PROBNP" in the last 8760 hours. HbA1C: No results for input(s): "HGBA1C" in the last 72 hours.  CBG: Recent Labs  Lab 11/03/22 1114 11/03/22 1637 11/03/22 2108 11/04/22 0940 11/04/22 1208  GLUCAP 258* 156* 150* 168* 171*   Lipid Profile: No results for input(s): "CHOL", "HDL", "LDLCALC", "TRIG", "CHOLHDL", "LDLDIRECT" in the last 72 hours. Thyroid Function Tests: No results for input(s): "TSH", "T4TOTAL", "FREET4", "T3FREE", "THYROIDAB" in the last 72 hours. Anemia Panel: No results for input(s): "VITAMINB12", "FOLATE", "FERRITIN", "TIBC", "IRON", "RETICCTPCT" in the last 72 hours. Most Recent Urinalysis On File:     Component Value Date/Time   COLORURINE YELLOW (A) 08/10/2022 1840   APPEARANCEUR HAZY (A) 08/10/2022 1840   APPEARANCEUR Clear 07/05/2014 2040   LABSPEC 1.016 08/10/2022 1840   LABSPEC 1.030 07/05/2014 2040   PHURINE 6.0 08/10/2022 1840   GLUCOSEU NEGATIVE 08/10/2022 1840   GLUCOSEU >=500 07/05/2014 2040   HGBUR NEGATIVE 08/10/2022 1840   BILIRUBINUR NEGATIVE 08/10/2022 1840   BILIRUBINUR Negative 07/05/2014 2040   KETONESUR NEGATIVE 08/10/2022 1840   PROTEINUR NEGATIVE 08/10/2022 1840   NITRITE NEGATIVE 08/10/2022 1840   LEUKOCYTESUR MODERATE (A) 08/10/2022 1840   LEUKOCYTESUR Negative 07/05/2014 2040   Sepsis Labs: @LABRCNTIP$ (procalcitonin:4,lacticidven:4) Microbiology: Recent Results (from the past 240 hour(s))  Blood culture (routine x 2)     Status: None   Collection Time: 10/30/22  8:34 PM   Specimen: BLOOD  Result Value Ref Range Status   Specimen Description BLOOD LEFT ANTECUBITAL  Final   Special Requests   Final    BOTTLES DRAWN AEROBIC AND ANAEROBIC Blood Culture results may not be optimal due to an excessive volume of blood received  in culture bottles   Culture   Final    NO GROWTH 5 DAYS Performed at [facility], [address] [place], [place] [address]    Report Status 11/04/2022 FINAL  Final  Blood culture (routine x 2)     Status: None   Collection Time: 10/30/22  8:34 PM   Specimen: BLOOD  Result Value Ref Range Status   Specimen Description BLOOD RIGHT ANTECUBITAL  Final   Special Requests   Final    BOTTLES DRAWN AEROBIC AND ANAEROBIC Blood Culture adequate volume   Culture   Final    NO GROWTH 5 DAYS Performed at [facility], [address] [place], [place] [address]    Report Status 11/04/2022 FINAL  Final      Radiology Studies last 3 days: DG Foot Complete Right  Result Date: 11/04/2022 CLINICAL DATA:  Postop amputation of third toe. EXAM: RIGHT FOOT COMPLETE - 3+ VIEW COMPARISON:  10/30/2022 FINDINGS: Interval third toe resection. Expected postoperative changes in the soft tissues. Advanced degenerative change of the first metatarsal phalangeal joint. Remainder the exam is unchanged. IMPRESSION: Interval third toe resection. Electronically Signed   By: Keith Rake M.D.   On: 11/04/2022 10:43   PERIPHERAL VASCULAR CATHETERIZATION  Result Date: 11/02/2022 See surgical note for result.            LOS: 5 days    Emeterio Reeve, DO [facility] 11/04/2022, 12:43 PM    Dictation software may have been used to generate the above note. Typos may occur and escape review in typed/dictated notes. Please contact Dr Sheppard Coil directly for clarity if needed.  Staff may message me via secure chat in [place]  but this may not receive an immediate response,  please page me for urgent matters!  If 7PM-7AM, please contact night coverage [email or web address]

## 2022-11-04 NOTE — Anesthesia Postprocedure Evaluation (Signed)
Anesthesia Post Note  Patient: Tyler [name]  Procedure(s) Performed: AMPUTATION TOE (Right: Toe)  Anesthesia Type: General Anesthetic complications: no   No notable events documented.   Last Vitals:  Vitals:   11/03/22 1948 11/04/22 0430  BP: (!) 147/60 (!) 158/71  Pulse: 63 65  Resp: 20 20  Temp: 37.1 C 37.1 C  SpO2: 95% 94%    Last Pain:  Vitals:   11/04/22 0749  TempSrc:   PainSc: 0-No pain                 Claiborne Billings Lacrisha Bielicki

## 2022-11-04 NOTE — Interval H&P Note (Signed)
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  The various methods of treatment have been discussed with the patient and family. After consideration of risks, benefits and other options for treatment, the patient has consented to  Procedure(s): AMPUTATION TOE (Right) as a surgical intervention.  [name] history has been reviewed, patient examined, no change in status, stable for surgery.  I have reviewed the patient's chart and labs.  Questions were answered to the patient's satisfaction.     Edrick Kins

## 2022-11-04 NOTE — Anesthesia Postprocedure Evaluation (Signed)
Anesthesia Post Note  Patient: Tyler [name]  Procedure(s) Performed: AMPUTATION TOE (Right: Toe)  Patient location during evaluation: PACU Anesthesia Type: General Level of consciousness: awake and alert Pain management: pain level controlled Vital Signs Assessment: post-procedure vital signs reviewed and stable Respiratory status: spontaneous breathing, nonlabored ventilation, respiratory function stable and patient connected to nasal cannula oxygen Cardiovascular status: blood pressure returned to baseline and stable Postop Assessment: no apparent nausea or vomiting Anesthetic complications: no   No notable events documented.   Last Vitals:  Vitals:   11/04/22 0945 11/04/22 1000  BP: 124/63 (!) 121/52  Pulse: 79 77  Resp: 20 (!) 24  Temp:    SpO2: 92% 95%    Last Pain:  Vitals:   11/04/22 1000  TempSrc:   PainSc: 2                  Ilene Qua

## 2022-11-05 ENCOUNTER — Inpatient Hospital Stay: Payer: BC Managed Care – PPO

## 2022-11-05 ENCOUNTER — Encounter: Payer: Self-pay | Admitting: Vascular Surgery

## 2022-11-05 DIAGNOSIS — L089 Local infection of the skin and subcutaneous tissue, unspecified: Secondary | ICD-10-CM | POA: Diagnosis not present

## 2022-11-05 DIAGNOSIS — R040 Epistaxis: Secondary | ICD-10-CM | POA: Insufficient documentation

## 2022-11-05 LAB — GLUCOSE, CAPILLARY
Glucose-Capillary: 168 mg/dL — ABNORMAL HIGH (ref 70–99)
Glucose-Capillary: 201 mg/dL — ABNORMAL HIGH (ref 70–99)
Glucose-Capillary: 225 mg/dL — ABNORMAL HIGH (ref 70–99)
Glucose-Capillary: 158 mg/dL — ABNORMAL HIGH (ref 70–99)
Glucose-Capillary: 197 mg/dL — ABNORMAL HIGH (ref 70–99)

## 2022-11-05 LAB — BASIC METABOLIC PANEL
Anion gap: 8 (ref 5–15)
BUN: 37 mg/dL — ABNORMAL HIGH (ref 8–23)
CO2: 20 mmol/L — ABNORMAL LOW (ref 22–32)
Calcium: 8.6 mg/dL — ABNORMAL LOW (ref 8.9–10.3)
Chloride: 104 mmol/L (ref 98–111)
Creatinine, Ser: 1.16 mg/dL (ref 0.61–1.24)
GFR, Estimated: >60 mL/min (ref >60)
Glucose, Bld: 300 mg/dL — ABNORMAL HIGH (ref 70–99)
Potassium: 4.3 mmol/L (ref 3.5–5.1)
Sodium: 132 mmol/L — ABNORMAL LOW (ref 135–145)

## 2022-11-05 LAB — APTT: aPTT: 33 seconds (ref 24–36)

## 2022-11-05 LAB — CBC
HCT: 29.8 % — ABNORMAL LOW (ref 39.0–52.0)
Hemoglobin: 9.9 g/dL — ABNORMAL LOW (ref 13.0–17.0)
MCH: 30.7 pg (ref 26.0–34.0)
MCHC: 33.2 g/dL (ref 30.0–36.0)
MCV: 92.5 fL (ref 80.0–100.0)
Platelets: 224 10*3/uL (ref 150–400)
RBC: 3.22 MIL/uL — ABNORMAL LOW (ref 4.22–5.81)
RDW: 12.5 % (ref 11.5–15.5)
WBC: 9.7 10*3/uL (ref 4.0–10.5)
nRBC: 0 % (ref 0.0–0.2)

## 2022-11-05 LAB — PROTIME-INR
INR: 1.2 (ref 0.8–1.2)
Prothrombin Time: 15.4 seconds — ABNORMAL HIGH (ref 11.4–15.2)

## 2022-11-05 MED ORDER — PSEUDOEPHEDRINE HCL 30 MG PO TABS
30.0000 mg | ORAL_TABLET | Freq: Once | ORAL | Status: AC
  Administered 2022-11-05: 30 mg via ORAL
  Filled 2022-11-05: qty 1

## 2022-11-05 MED ORDER — DIPHENHYDRAMINE HCL 25 MG PO CAPS
50.0000 mg | ORAL_CAPSULE | Freq: Once | ORAL | Status: AC
  Administered 2022-11-05: 50 mg via ORAL
  Filled 2022-11-05: qty 2

## 2022-11-05 NOTE — Progress Notes (Signed)
PROGRESS NOTE    Tyler [name]   [id number] DOB: 1946-09-15  DOA: 10/30/2022 Date of Service: 11/05/22 PCP: Rhetta Mura., MD     Brief Narrative / Hospital Course:  Tyler [name] is a 77 y.o. male with medical history significant of type 2 diabetes mellitus, atrial fibrillation who presents to the emergency department 10/30/22 due to infection of right third toe.  He states that he noted his right third toe turning dark few days ago and complained of pain in the affected area as well as fever.  02/06: Right foot x-ray showed soft tissue edema overlies the dorsum of the foot. No soft tissue gas or radiopaque foreign body. No radiographic findings of osteomyelitis. Started on IV vancomycin and Zosyn. Admitted to hospitalist service. Podiatry to see tomorrow per H&P.  02/07: ABI abn, vascular planning angio Thu 02/08 or Fri 02/09, podiatry planning amputation toe Sat/Sun 02/08: RLE angiography tomorrow 02/09: RLE angiography today, see below. Podiatry plans at least toe amputation this weekend (today is Friday) 02/10: stable, amputation planned tomorrow 02/11: post amputation R 3rd toe, stable, podiatry recs keep at least until tomorrow to monitor surgical wound. Metatarsal appeared clear of infection.  02/12: OK to discharge from podiatry standpoint. Yesterday and overnight significant epistaxis L nare requiring insertion rhino rocket packing overnight, holding heparin/ASA, removed this around noon today, no bleeding at that time. He reports nasal congestion, no coughing or SOB, he did cough up substantial clot this morning but nothing since then. Monitor closely, had some additional bleeding this afternoon but minimal, monitor overnight and if no bleeding can d/c early AM tomorrow    Consultants:  Podiatry  Vascular surgery   Procedures: RLE angiogram and balloon angioplasty R anterior tibial A, R popliteal A, R superficial femoral A, stent placement to SFA 11/02/22        ASSESSMENT & PLAN:   Principal Problem:   Right foot infection Active Problems:   Cellulitis of right foot   Type 2 diabetes mellitus with complication, without long-term current use of insulin (HCC)   Persistent atrial fibrillation (HCC)   Essential hypertension   CKD (chronic kidney disease), stage III (HCC)   Obesity (BMI 30-39.9)   Gangrene of toe of right foot ([place])   PAD (peripheral artery disease) ([place])   Foot infection   Epistaxis  Right foot infection with superimposed cellulitis PVD S/p angiography/angioplasty RLE 11/02/22  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  continue vancomycin and cefepime --> 7-10 days po abx per podiatry Follow outpatient w/ podiatry as directed    Epistaxis ENT if needed  SOB/congestion CXR   Type 2 diabetes mellitus Continue ISS and hypoglycemia protocol Glipizide and metformin held at this time   Persistent atrial fibrillation Continue Coreg - increased dose d/t HTN Patient was not on any anticoagulant PTA per med rec   Essential hypertension (uncontrolled) Continue lisinopril and Coreg - increased doses d/t HTN    CKD stage 3A AKI - resolved Restarted home dose lasix po  Monitor BMP   Obesity (BMI 32.55) Diet and lifestyle modification     DVT prophylaxis: SCD Pertinent IV fluids/nutrition: no continuous IV fluids  Central lines / invasive devices: none  Code Status: FULL CODE   Current Admission Status: inpatient  TOC needs / Dispo plan: none at this time, pend PT/OT eval after toe amputation, expect he may benefit from [facility] or outpatient PT Barriers to discharge / significant pending items: podiatry and vascular recs, IV abx  for now, toe amputation today and hopefully discharge next 1-2 days per podiatry recs, confirm if will need continued IV abx or can transition to po or d/c abx              Subjective / Brief ROS:  Patient reports pain is controlled  Reports sinus congestion, would like the  packing pulled today which was done  had some additional nasal bleeding this afternoon but minimal Denies CP/SOB.  No other concerns from pt or RN   Family Communication: none at this time     Objective Findings:  Vitals:   11/04/22 1545 11/04/22 2019 11/05/22 0430 11/05/22 0823  BP: (!) 141/63 133/66 (!) 156/64 (!) 158/77  Pulse: 69 68 73 66  Resp: 18 (!) 24 (!) 22 20  Temp: 98.7 F (37.1 C) 98.3 F (36.8 C) 98 F (36.7 C) 99 F (37.2 C)  TempSrc:  Oral Oral   SpO2: 99% 96% 98% 99%  Weight:      Height:        Intake/Output Summary (Last 24 hours) at 11/05/2022 1539 Last data filed at 11/05/2022 1125 Gross per 24 hour  Intake --  Output 1100 ml  Net -1100 ml   Filed Weights   11/02/22 0357  Weight: 114.2 kg    Examination:  Physical Exam Constitutional:      General: He is not in acute distress.    Appearance: Normal appearance. He is not ill-appearing.  HENT:     Nose: Congestion present.     Comments: Dried blood L nare Pulmonary:     Effort: Pulmonary effort is normal.  Musculoskeletal:     Right lower leg: Edema (improved from previous exams) present.  Neurological:     Mental Status: He is alert.          Scheduled Medications:   aspirin EC  81 mg Oral Daily   atorvastatin  20 mg Oral Daily   carvedilol  6.25 mg Oral BID   Chlorhexidine Gluconate Cloth  6 each Topical Once   Chlorhexidine Gluconate Cloth  6 each Topical Q0600   clopidogrel  75 mg Oral Daily   furosemide  40 mg Oral Daily   heparin injection (subcutaneous)  5,000 Units Subcutaneous Q8H   insulin aspart  0-15 Units Subcutaneous TID WC   lisinopril  40 mg Oral Daily    Continuous Infusions:  sodium chloride Stopped (11/03/22 1630)   ceFEPime (MAXIPIME) IV 2 g (11/05/22 1256)   vancomycin 1,500 mg (11/04/22 1735)    PRN Medications:  sodium chloride, acetaminophen **OR** acetaminophen, fentaNYL (SUBLIMAZE) injection, guaiFENesin-dextromethorphan, HYDROmorphone  (DILAUDID) injection, menthol-cetylpyridinium, ondansetron **OR** ondansetron (ZOFRAN) IV, ondansetron (ZOFRAN) IV, oxymetazoline, sodium chloride  Antimicrobials from admission:  Anti-infectives (From admission, onward)    Start     Dose/Rate Route Frequency Ordered Stop   11/01/22 1925  ceFAZolin (ANCEF) IVPB 2g/100 mL premix        2 g 200 mL/hr over 30 Minutes Intravenous 30 min pre-op 11/01/22 1925 11/02/22 0852   10/31/22 1800  vancomycin (VANCOREADY) IVPB 1750 mg/350 mL  Status:  Discontinued        1,750 mg 175 mL/hr over 120 Minutes Intravenous Every 24 hours 10/31/22 0117 10/31/22 0759   10/31/22 1800  vancomycin (VANCOREADY) IVPB 1500 mg/300 mL        1,500 mg 150 mL/hr over 120 Minutes Intravenous Every 24 hours 10/31/22 0759 11/07/22 1759   10/31/22 1300  ceFEPIme (MAXIPIME) 2 g in sodium chloride  0.9 % 100 mL IVPB        2 g 200 mL/hr over 30 Minutes Intravenous Every 8 hours 10/31/22 0759 11/07/22 0459   10/31/22 0400  ceFEPIme (MAXIPIME) 2 g in sodium chloride 0.9 % 100 mL IVPB  Status:  Discontinued        2 g 200 mL/hr over 30 Minutes Intravenous Every 12 hours 10/31/22 0117 10/31/22 0759   10/31/22 0200  vancomycin (VANCOCIN) IVPB 1000 mg/200 mL premix  Status:  Discontinued        1,000 mg 200 mL/hr over 60 Minutes Intravenous  Once 10/31/22 0109 10/31/22 0117   10/31/22 0200  ceFEPIme (MAXIPIME) 2 g in sodium chloride 0.9 % 100 mL IVPB  Status:  Discontinued        2 g 200 mL/hr over 30 Minutes Intravenous  Once 10/31/22 0109 10/31/22 0117   10/30/22 2045  vancomycin (VANCOREADY) IVPB 2000 mg/400 mL        2,000 mg 200 mL/hr over 120 Minutes Intravenous  Once 10/30/22 2033 10/30/22 2319   10/30/22 2045  piperacillin-tazobactam (ZOSYN) IVPB 3.375 g        3.375 g 100 mL/hr over 30 Minutes Intravenous  Once 10/30/22 2033 10/30/22 2117           Data Reviewed:  I have personally reviewed the following...  CBC: Recent Labs  Lab 10/30/22 1742  10/31/22 0402 11/05/22 0125  WBC 13.1* 11.8* 9.7  HGB 11.1* 10.6* 9.9*  HCT 33.1* 31.7* 29.8*  MCV 93.0 93.5 92.5  PLT 250 240 [id number]   Basic Metabolic Panel: Recent Labs  Lab 10/30/22 1742 10/31/22 0402 11/01/22 0459 11/03/22 0445 11/05/22 0125  NA 135 136 133*  --  132*  K 4.1 3.7 3.5  --  4.3  CL 103 107 103  --  104  CO2 19* 21* 21*  --  20*  GLUCOSE 158* 153* 177*  --  300*  BUN 40* 36* 36*  --  37*  CREATININE 1.46* 1.18 1.18 1.14 1.16  CALCIUM 8.7* 8.5* 8.6*  --  8.6*  MG  --  2.0  --   --   --   PHOS  --  3.6  --   --   --    GFR: Estimated Creatinine Clearance: 70.5 mL/min (by C-G formula based on SCr of 1.16 mg/dL). Liver Function Tests: Recent Labs  Lab 10/31/22 0402  AST 15  ALT 22  ALKPHOS 82  BILITOT 0.9  PROT 6.8  ALBUMIN 2.7*   No results for input(s): "LIPASE", "AMYLASE" in the last 168 hours. No results for input(s): "AMMONIA" in the last 168 hours. Coagulation Profile: Recent Labs  Lab 11/05/22 0125  INR 1.2   Cardiac Enzymes: No results for input(s): "CKTOTAL", "CKMB", "CKMBINDEX", "TROPONINI" in the last 168 hours. BNP (last 3 results) No results for input(s): "PROBNP" in the last 8760 hours. HbA1C: No results for input(s): "HGBA1C" in the last 72 hours.  CBG: Recent Labs  Lab 11/04/22 1208 11/04/22 1706 11/04/22 2102 11/05/22 0824 11/05/22 1124  GLUCAP 171* 165* 217* 201* 225*   Lipid Profile: No results for input(s): "CHOL", "HDL", "LDLCALC", "TRIG", "CHOLHDL", "LDLDIRECT" in the last 72 hours. Thyroid Function Tests: No results for input(s): "TSH", "T4TOTAL", "FREET4", "T3FREE", "THYROIDAB" in the last 72 hours. Anemia Panel: No results for input(s): "VITAMINB12", "FOLATE", "FERRITIN", "TIBC", "IRON", "RETICCTPCT" in the last 72 hours. Most Recent Urinalysis On File:     Component Value Date/Time   COLORURINE YELLOW (  A) 08/10/2022 1840   APPEARANCEUR HAZY (A) 08/10/2022 1840   APPEARANCEUR Clear 07/05/2014 2040    LABSPEC 1.016 08/10/2022 1840   LABSPEC 1.030 07/05/2014 2040   PHURINE 6.0 08/10/2022 1840   GLUCOSEU NEGATIVE 08/10/2022 1840   GLUCOSEU >=500 07/05/2014 2040   HGBUR NEGATIVE 08/10/2022 1840   BILIRUBINUR NEGATIVE 08/10/2022 1840   BILIRUBINUR Negative 07/05/2014 2040   KETONESUR NEGATIVE 08/10/2022 1840   PROTEINUR NEGATIVE 08/10/2022 1840   NITRITE NEGATIVE 08/10/2022 1840   LEUKOCYTESUR MODERATE (A) 08/10/2022 1840   LEUKOCYTESUR Negative 07/05/2014 2040   Sepsis Labs: @LABRCNTIP$ (procalcitonin:4,lacticidven:4) Microbiology: Recent Results (from the past 240 hour(s))  Blood culture (routine x 2)     Status: None   Collection Time: 10/30/22  8:34 PM   Specimen: BLOOD  Result Value Ref Range Status   Specimen Description BLOOD LEFT ANTECUBITAL  Final   Special Requests   Final    BOTTLES DRAWN AEROBIC AND ANAEROBIC Blood Culture results may not be optimal due to an excessive volume of blood received in culture bottles   Culture   Final    NO GROWTH 5 DAYS Performed at [facility], [address] [place], [place] [address]    Report Status 11/04/2022 FINAL  Final  Blood culture (routine x 2)     Status: None   Collection Time: 10/30/22  8:34 PM   Specimen: BLOOD  Result Value Ref Range Status   Specimen Description BLOOD RIGHT ANTECUBITAL  Final   Special Requests   Final    BOTTLES DRAWN AEROBIC AND ANAEROBIC Blood Culture adequate volume   Culture   Final    NO GROWTH 5 DAYS Performed at [facility], [address] [place], [place] [address]    Report Status 11/04/2022 FINAL  Final  Aerobic/Anaerobic Culture w Gram Stain (surgical/deep wound)     Status: None (Preliminary result)   Collection Time: 11/04/22  9:10 AM   Specimen: PATH Digit amputation; Tissue  Result Value Ref Range Status   Specimen Description TISSUE  Final   Special Requests RIGHT 3RD TOE  Final   Gram Stain NO ORGANISMS SEEN NO WBC SEEN   Final   Culture   Final    NO  GROWTH < 24 HOURS Performed at [facility], [address] [place], [place] [address]    Report Status PENDING  Incomplete  Aerobic/Anaerobic Culture w Gram Stain (surgical/deep wound)     Status: None (Preliminary result)   Collection Time: 11/04/22  9:15 AM   Specimen: PATH Digit amputation; Tissue  Result Value Ref Range Status   Specimen Description BONE  Final   Special Requests RIGHT 3RD TOE  Final   Gram Stain   Final    RARE GRAM POSITIVE COCCI FEW GRAM NEGATIVE RODS NO WBC SEEN    Culture   Final    RARE PROTEUS PENNERI FEW GROUP B STREP(S.AGALACTIAE)ISOLATED TESTING AGAINST S. AGALACTIAE NOT ROUTINELY PERFORMED DUE TO PREDICTABILITY OF AMP/PEN/VAN SUSCEPTIBILITY. Performed at [facility], [place] [address], [place], [place] [address]    Report Status PENDING  Incomplete      Radiology Studies last 3 days: DG Foot Complete Right  Result Date: 11/04/2022 CLINICAL DATA:  Postop amputation of third toe. EXAM: RIGHT FOOT COMPLETE - 3+ VIEW COMPARISON:  10/30/2022 FINDINGS: Interval third toe resection. Expected postoperative changes in the soft tissues. Advanced degenerative change of the first metatarsal phalangeal joint. Remainder the exam is unchanged. IMPRESSION: Interval third toe  resection. Electronically Signed   By: Keith Rake M.D.   On: 11/04/2022 10:43   PERIPHERAL VASCULAR CATHETERIZATION  Result Date: 11/02/2022 See surgical note for result.            LOS: 6 days    Emeterio Reeve, DO [facility] 11/05/2022, 3:39 PM    Dictation software may have been used to generate the above note. Typos may occur and escape review in typed/dictated notes. Please contact Dr Sheppard Coil directly for clarity if needed.  Staff may message me via secure chat in [place]  but this may not receive an immediate response,  please page me for urgent matters!  If 7PM-7AM, please contact night coverage [email or web address]

## 2022-11-05 NOTE — Care Management Important Message (Signed)
Important Message  Patient Details  Name: Cayl Pike Achille MRN: [id number] Date of Birth: 08-27-1946   Medicare Important Message Given:  Yes     Dannette Barbara 11/05/2022, 2:49 PM

## 2022-11-05 NOTE — Progress Notes (Incomplete)
Pharmacy Antibiotic Note  Tyler [name] is a 77 y.o. male admitted on 10/30/2022 with cellulitis.  Pharmacy has been consulted for Cefepime and Vancomycin dosing for 7 days.  Plan: - continue Cefepime 2g q8H - continue vancomycin to 153m IV q24h - vancomycin peak ordered for 11/02/22 @2130$  - vancomycin trough ordered for 11/03/22 @1700$  - follow levels and renal function for adjustments  Pharmacy will continue to follow and will adjust abx dosing whenever warranted.  Height: 6' 2"$  (188 cm) Weight: 114.2 kg (251 lb 12.3 oz) IBW/kg (Calculated) : 82.2  Temp (24hrs), Avg:98.3 F (36.8 C), Min:98 F (36.7 C), Max:99 F (37.2 C)  Recent Labs  Lab 10/30/22 1742 10/30/22 2034 10/31/22 0402 11/01/22 0459 11/02/22 2117 11/03/22 0445 11/03/22 1722 11/05/22 0125  WBC 13.1*  --  11.8*  --   --   --   --  9.7  CREATININE 1.46*  --  1.18 1.18  --  1.14  --  1.16  LATICACIDVEN 1.0 0.8  --   --   --   --   --   --   VANCOTROUGH  --   --   --   --   --   --  14*  --   VANCOPEAK  --   --   --   --  31  --   --   --     Estimated Creatinine Clearance: 70.5 mL/min (by C-G formula based on SCr of 1.16 mg/dL).    No Known Allergies  Antimicrobials this admission: 2/6 Zosyn >> x 1 dose 2/6 Vancomycin >> x 7 days 2/7 Cefepime >> x 7 days  Dose adjustments this admission: ***  Microbiology results: 2/06 BCx: NGTD   Thank you for allowing pharmacy to be a part of this patient's care.  Hiromi Knodel 11/05/2022 8:55 AM

## 2022-11-05 NOTE — Progress Notes (Signed)
  Subjective:  Patient ID: Tyler [name], male    DOB: 1946/07/11,  MRN: [id number]  He says he is doing well not having any pain  Negative for chest pain and shortness of breath Fever: no Night sweats: no Constitutional signs: no Review of all other systems is negative Objective:   Vitals:   11/05/22 0430 11/05/22 0823  BP: (!) 156/64 (!) 158/77  Pulse: 73 66  Resp: (!) 22 20  Temp: 98 F (36.7 C) 99 F (37.2 C)  SpO2: 98% 99%   General AA&O x3. Normal mood and affect.  Vascular Dorsalis pedis and posterior tibial pulses 2/4 bilat. Brisk capillary refill to all digits. Pedal hair present.  Neurologic Epicritic sensation grossly reduced.  Dermatologic Incision intact, minimal bloody drainage, no signs of infection  Orthopedic: MMT 5/5 in dorsiflexion, plantarflexion, inversion, and eversion. Normal joint ROM without pain or crepitus.    Assessment & Plan:  Patient was evaluated and treated and all questions answered.  POD 1 right third toe amputation -WBAT to heel in postop shoe which he has -Culture with GPC's and GNR's  -Expect 7 to 10 days of p.o. antibiotics should be sufficient -Follow-up in 1 to 2 weeks with Dr. Amalia Hailey - okay to discharge from our standpoint with  Criselda Peaches, [name]  Accessible via secure chat for questions or concerns.

## 2022-11-05 NOTE — TOC Progression Note (Signed)
Transition of Care [facility]) - Progression Note    Patient Details  Name: TORION SHAAK MRN: [id number] Date of Birth: 1946-07-18  Transition of [facility]) CM/SW Contact  Beverly Sessions, RN Phone Number: 11/05/2022, 1:17 PM  Clinical Narrative:     Per podiatry plan for oral antibiotics at discharge        Expected Discharge Plan and Services                                               Social Determinants of Health (SDOH) Interventions SDOH Screenings   Food Insecurity: No Food Insecurity (10/30/2022)  Housing: Low Risk  (10/30/2022)  Transportation Needs: No Transportation Needs (10/30/2022)  Utilities: Not At Risk (10/30/2022)  Tobacco Use: Low Risk  (11/05/2022)    Readmission Risk Interventions     No data to display

## 2022-11-06 DIAGNOSIS — L089 Local infection of the skin and subcutaneous tissue, unspecified: Secondary | ICD-10-CM | POA: Diagnosis not present

## 2022-11-06 LAB — GLUCOSE, CAPILLARY: Glucose-Capillary: 194 mg/dL — ABNORMAL HIGH (ref 70–99)

## 2022-11-06 LAB — CREATININE, SERUM
Creatinine, Ser: 1.17 mg/dL (ref 0.61–1.24)
GFR, Estimated: >60 mL/min (ref >60)

## 2022-11-06 LAB — SURGICAL PATHOLOGY

## 2022-11-06 MED ORDER — LISINOPRIL 40 MG PO TABS: 40.0000 mg | ORAL_TABLET | Freq: Every day | ORAL | 0 refills | Status: DC

## 2022-11-06 MED ORDER — ASPIRIN 81 MG PO TBEC: 81.0000 mg | DELAYED_RELEASE_TABLET | Freq: Every day | ORAL | 0 refills | Status: DC

## 2022-11-06 MED ORDER — CARVEDILOL 6.25 MG PO TABS: 6.2500 mg | ORAL_TABLET | Freq: Two times a day (BID) | ORAL | 0 refills | Status: DC

## 2022-11-06 MED ORDER — DOXYCYCLINE HYCLATE 100 MG PO TABS: 100.0000 mg | ORAL_TABLET | Freq: Two times a day (BID) | ORAL | 0 refills | Status: DC

## 2022-11-06 MED ORDER — CLOPIDOGREL BISULFATE 75 MG PO TABS: 75.0000 mg | ORAL_TABLET | Freq: Every day | ORAL | 0 refills | Status: DC

## 2022-11-06 MED ORDER — ATORVASTATIN CALCIUM 20 MG PO TABS: 20.0000 mg | ORAL_TABLET | Freq: Every day | ORAL | 0 refills | Status: DC

## 2022-11-06 NOTE — Discharge Summary (Signed)
Physician Discharge Summary   Patient: Tyler [name] MRN: [id number]  DOB: [birth year]   Admit:     Date of Admission: 10/30/2022 Admitted from: home   Discharge: Date of discharge: 11/06/22 Disposition: Home Condition at discharge: good  CODE STATUS: FULL CODE     Discharge Physician: Emeterio Reeve, DO [facility]     PCP: Rhetta Mura., MD  Recommendations for Outpatient Follow-up:  Follow up with PCP Rhetta Mura., MD in 1-2 weeks Please obtain labs/tests: CBC, BMP in 1-2 weeks Follow w/ podiatry and vascular surgery as directed  Please follow up on the following pending results: final pathology results from surgery  PCP AND OTHER OUTPATIENT PROVIDERS: SEE BELOW FOR SPECIFIC DISCHARGE INSTRUCTIONS PRINTED FOR PATIENT IN ADDITION TO GENERIC AVS PATIENT INFO     Discharge Instructions     Call MD for:  redness, tenderness, or signs of infection (pain, swelling, redness, odor or green/yellow discharge around incision site)   Complete by: As directed    Call MD for:  severe uncontrolled pain   Complete by: As directed    Call MD for:  temperature >100.4   Complete by: As directed    Diet - low sodium heart healthy   Complete by: As directed    Discharge wound care:   Complete by: As directed    Keep dressing clean and dry, ok to shower but do not soak, follow as directed w/ podiatry   Increase activity slowly   Complete by: As directed          Discharge Diagnoses: Principal Problem:   Right foot infection Active Problems:   Cellulitis of right foot   Type 2 diabetes mellitus with complication, without long-term current use of insulin (HCC)   Persistent atrial fibrillation ([place])   Essential hypertension   CKD (chronic kidney disease), stage III ([place])   Obesity (BMI 30-39.9)   Gangrene of toe of right foot ([place])   PAD (peripheral artery disease) ([place])   Foot infection   Epistaxis       Hospital  Course: Tyler [name] is a 77 y.o. male with medical history significant of type 2 diabetes mellitus, atrial fibrillation who presents to the emergency department 10/30/22 due to infection of right third toe.  He states that he noted his right third toe turning dark few days ago and complained of pain in the affected area as well as fever.  02/06: Right foot x-ray showed soft tissue edema overlies the dorsum of the foot. No soft tissue gas or radiopaque foreign body. No radiographic findings of osteomyelitis. Started on IV vancomycin and Zosyn. Admitted to hospitalist service. Podiatry to see tomorrow per H&P.  02/07: ABI abn, vascular planning angio Thu 02/08 or Fri 02/09, podiatry planning amputation toe Sat/Sun 02/08: RLE angiography tomorrow 02/09: RLE angiography today, see below. Podiatry plans at least toe amputation this weekend (today is Friday) 02/10: stable, amputation planned tomorrow 02/11: post amputation R 3rd toe, stable, podiatry recs keep at least until tomorrow to monitor surgical wound. Metatarsal appeared clear of infection.  02/12: OK to discharge from podiatry standpoint. Yesterday and overnight significant epistaxis L nare requiring insertion rhino rocket packing overnight, holding heparin/ASA, removed this around noon today, no bleeding at that time. He reports nasal congestion, no coughing or SOB, he did cough up substantial clot this morning but nothing since then. Monitor closely, had some additional bleeding this afternoon but minimal, monitor overnight and if no  bleeding can d/c early AM tomorrow  02/13: stable overnight, no additional epistaxis, ok to restart ASA?plavix later today or tomorrow and follow outpatient    Consultants:  Podiatry  Vascular surgery   Procedures: RLE angiogram and balloon angioplasty R anterior tibial A, R popliteal A, R superficial femoral A, stent placement to SFA 11/02/22  Amputation R 3rd toe 11/04/2022      ASSESSMENT & PLAN:    Principal Problem:   Right foot infection Active Problems:   Cellulitis of right foot   Type 2 diabetes mellitus with complication, without long-term current use of insulin (HCC)   Persistent atrial fibrillation (HCC)   Essential hypertension   CKD (chronic kidney disease), stage III (HCC)   Obesity (BMI 30-39.9)   Gangrene of toe of right foot ([place])   PAD (peripheral artery disease) ([place])   Foot infection   Epistaxis  Right foot infection with superimposed cellulitis PVD S/p angiography/angioplasty RLE 11/02/22  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  7-10 days po abx per podiatry, d/c on doxy Follow outpatient w/ podiatry as directed    Epistaxis ENT if needed  SOB/congestion CXR   Type 2 diabetes mellitus Continue ISS and hypoglycemia protocol Glipizide and metformin held at this time   Persistent atrial fibrillation Continue Coreg - increased dose d/t HTN Patient was not on any anticoagulant PTA per med rec   Essential hypertension (uncontrolled) Continue lisinopril and Coreg - increased doses d/t HTN    CKD stage 3A AKI - resolved Restarted home dose lasix po  Monitor BMP   Obesity (BMI 32.55) Diet and lifestyle modification              Discharge Instructions  Allergies as of 11/06/2022   No Known Allergies      Medication List     TAKE these medications    aspirin EC 81 MG tablet Take 1 tablet (81 mg total) by mouth daily. Swallow whole. Start taking on: November 07, 2022   atorvastatin 20 MG tablet Commonly known as: LIPITOR Take 1 tablet (20 mg total) by mouth daily. Start taking on: November 07, 2022   carvedilol 6.25 MG tablet Commonly known as: COREG Take 1 tablet (6.25 mg total) by mouth 2 (two) times daily. What changed:  medication strength how much to take   clopidogrel 75 MG tablet Commonly known as: PLAVIX Take 1 tablet (75 mg total) by mouth daily. Start taking on: November 07, 2022   doxycycline 100 MG  tablet Commonly known as: VIBRA-TABS Take 1 tablet (100 mg total) by mouth 2 (two) times daily.   furosemide 40 MG tablet Commonly known as: LASIX Take 40 mg by mouth daily.   glipiZIDE 10 MG tablet Commonly known as: GLUCOTROL Take 10 mg by mouth 2 (two) times daily.   lisinopril 40 MG tablet Commonly known as: ZESTRIL Take 1 tablet (40 mg total) by mouth daily. Start taking on: November 07, 2022 What changed:  medication strength how much to take   metFORMIN 1000 MG tablet Commonly known as: GLUCOPHAGE Take 1,000 mg by mouth 2 (two) times daily.   metolazone 2.5 MG tablet Commonly known as: ZAROXOLYN Take 2.5 mg by mouth once a week.               Discharge Care Instructions  (From admission, onward)           Start     Ordered   11/06/22 0000  Discharge wound care:  Comments: Keep dressing clean and dry, ok to shower but do not soak, follow as directed w/ podiatry   11/06/22 0943             Follow-up Information     Edrick Kins, [name]. Schedule an appointment as soon as possible for a visit in 1 week(s).   Specialty: Podiatry Why: For suture removal, For wound re-check Contact information: [place] [address] [phone]                 No Known Allergies   Subjective: pt reports no significant bleeding from nose overnight, pain is controlled, no concerns otherwise    Discharge Exam: BP (!) 150/78 (BP Location: Right Arm)   Pulse 80   Temp 98 F (36.7 C) (Oral)   Resp 20   Ht 6' 2"$  (1.88 m)   Wt 114.2 kg   SpO2 99%   BMI 32.32 kg/m  General: Pt is alert, awake, not in acute distress Cardiovascular: RRR, S1/S2 +, no rubs, no gallops Respiratory: CTA bilaterally, no wheezing, no rhonchi Abdominal: Soft, NT, ND, Extremities: +stable edema RLE and LLE, no erythema     The results of significant diagnostics from this hospitalization (including imaging, microbiology, ancillary and laboratory)  are listed below for reference.     Microbiology: Recent Results (from the past 240 hour(s))  Blood culture (routine x 2)     Status: None   Collection Time: 10/30/22  8:34 PM   Specimen: BLOOD  Result Value Ref Range Status   Specimen Description BLOOD LEFT ANTECUBITAL  Final   Special Requests   Final    BOTTLES DRAWN AEROBIC AND ANAEROBIC Blood Culture results may not be optimal due to an excessive volume of blood received in culture bottles   Culture   Final    NO GROWTH 5 DAYS Performed at [facility], [address] [place], [place] [address]    Report Status 11/04/2022 FINAL  Final  Blood culture (routine x 2)     Status: None   Collection Time: 10/30/22  8:34 PM   Specimen: BLOOD  Result Value Ref Range Status   Specimen Description BLOOD RIGHT ANTECUBITAL  Final   Special Requests   Final    BOTTLES DRAWN AEROBIC AND ANAEROBIC Blood Culture adequate volume   Culture   Final    NO GROWTH 5 DAYS Performed at [facility], [address], [place], [place] [address]    Report Status 11/04/2022 FINAL  Final  Aerobic/Anaerobic Culture w Gram Stain (surgical/deep wound)     Status: None (Preliminary result)   Collection Time: 11/04/22  9:10 AM   Specimen: PATH Digit amputation; Tissue  Result Value Ref Range Status   Specimen Description TISSUE  Final   Special Requests RIGHT 3RD TOE  Final   Gram Stain NO ORGANISMS SEEN NO WBC SEEN   Final   Culture   Final    NO GROWTH 2 DAYS Performed at [facility], [address] [place], [place] [address]    Report Status PENDING  Incomplete  Aerobic/Anaerobic Culture w Gram Stain (surgical/deep wound)     Status: None (Preliminary result)   Collection Time: 11/04/22  9:15 AM   Specimen: PATH Digit amputation; Tissue  Result Value Ref Range Status   Specimen Description BONE  Final   Special Requests RIGHT 3RD TOE  Final   Gram Stain   Final    RARE GRAM POSITIVE COCCI  FEW GRAM NEGATIVE RODS NO  WBC SEEN    Culture   Final    RARE PROTEUS PENNERI FEW GROUP B STREP(S.AGALACTIAE)ISOLATED TESTING AGAINST S. AGALACTIAE NOT ROUTINELY PERFORMED DUE TO PREDICTABILITY OF AMP/PEN/VAN SUSCEPTIBILITY. Performed at [facility], [place] [address] [place], [place] [address]    Report Status PENDING  Incomplete     Labs: BNP (last 3 results) No results for input(s): "BNP" in the last 8760 hours. Basic Metabolic Panel: Recent Labs  Lab 10/30/22 1742 10/31/22 0402 11/01/22 0459 11/03/22 0445 11/05/22 0125 11/06/22 0447  NA 135 136 133*  --  132*  --   K 4.1 3.7 3.5  --  4.3  --   CL 103 107 103  --  104  --   CO2 19* 21* 21*  --  20*  --   GLUCOSE 158* 153* 177*  --  300*  --   BUN 40* 36* 36*  --  37*  --   CREATININE 1.46* 1.18 1.18 1.14 1.16 1.17  CALCIUM 8.7* 8.5* 8.6*  --  8.6*  --   MG  --  2.0  --   --   --   --   PHOS  --  3.6  --   --   --   --    Liver Function Tests: Recent Labs  Lab 10/31/22 0402  AST 15  ALT 22  ALKPHOS 82  BILITOT 0.9  PROT 6.8  ALBUMIN 2.7*   No results for input(s): "LIPASE", "AMYLASE" in the last 168 hours. No results for input(s): "AMMONIA" in the last 168 hours. CBC: Recent Labs  Lab 10/30/22 1742 10/31/22 0402 11/05/22 0125  WBC 13.1* 11.8* 9.7  HGB 11.1* 10.6* 9.9*  HCT 33.1* 31.7* 29.8*  MCV 93.0 93.5 92.5  PLT 250 240 224   Cardiac Enzymes: No results for input(s): "CKTOTAL", "CKMB", "CKMBINDEX", "TROPONINI" in the last 168 hours. BNP: Invalid input(s): "POCBNP" CBG: Recent Labs  Lab 11/05/22 0824 11/05/22 1124 11/05/22 1639 11/05/22 2035 11/06/22 0803  GLUCAP 201* 225* 158* 197* 194*   D-Dimer No results for input(s): "DDIMER" in the last 72 hours. Hgb A1c No results for input(s): "HGBA1C" in the last 72 hours. Lipid Profile No results for input(s): "CHOL", "HDL", "LDLCALC", "TRIG", "CHOLHDL", "LDLDIRECT" in the last 72 hours. Thyroid function studies No results for input(s): "TSH", "T4TOTAL",  "T3FREE", "THYROIDAB" in the last 72 hours.  Invalid input(s): "FREET3" Anemia work up No results for input(s): "VITAMINB12", "FOLATE", "FERRITIN", "TIBC", "IRON", "RETICCTPCT" in the last 72 hours. Urinalysis    Component Value Date/Time   COLORURINE YELLOW (A) 08/10/2022 1840   APPEARANCEUR HAZY (A) 08/10/2022 1840   APPEARANCEUR Clear 07/05/2014 2040   LABSPEC 1.016 08/10/2022 1840   LABSPEC 1.030 07/05/2014 2040   PHURINE 6.0 08/10/2022 1840   GLUCOSEU NEGATIVE 08/10/2022 1840   GLUCOSEU >=500 07/05/2014 2040   HGBUR NEGATIVE 08/10/2022 1840   BILIRUBINUR NEGATIVE 08/10/2022 1840   BILIRUBINUR Negative 07/05/2014 2040   KETONESUR NEGATIVE 08/10/2022 1840   PROTEINUR NEGATIVE 08/10/2022 1840   NITRITE NEGATIVE 08/10/2022 1840   LEUKOCYTESUR MODERATE (A) 08/10/2022 1840   LEUKOCYTESUR Negative 07/05/2014 2040   Sepsis Labs Recent Labs  Lab 10/30/22 1742 10/31/22 0402 11/05/22 0125  WBC 13.1* 11.8* 9.7   Microbiology Recent Results (from the past 240 hour(s))  Blood culture (routine x 2)     Status: None   Collection Time: 10/30/22  8:34 PM   Specimen: BLOOD  Result Value Ref  Range Status   Specimen Description BLOOD LEFT ANTECUBITAL  Final   Special Requests   Final    BOTTLES DRAWN AEROBIC AND ANAEROBIC Blood Culture results may not be optimal due to an excessive volume of blood received in culture bottles   Culture   Final    NO GROWTH 5 DAYS Performed at [facility], [address] [place], [place] [address]    Report Status 11/04/2022 FINAL  Final  Blood culture (routine x 2)     Status: None   Collection Time: 10/30/22  8:34 PM   Specimen: BLOOD  Result Value Ref Range Status   Specimen Description BLOOD RIGHT ANTECUBITAL  Final   Special Requests   Final    BOTTLES DRAWN AEROBIC AND ANAEROBIC Blood Culture adequate volume   Culture   Final    NO GROWTH 5 DAYS Performed at [facility], [address], [place], [place] [address]     Report Status 11/04/2022 FINAL  Final  Aerobic/Anaerobic Culture w Gram Stain (surgical/deep wound)     Status: None (Preliminary result)   Collection Time: 11/04/22  9:10 AM   Specimen: PATH Digit amputation; Tissue  Result Value Ref Range Status   Specimen Description TISSUE  Final   Special Requests RIGHT 3RD TOE  Final   Gram Stain NO ORGANISMS SEEN NO WBC SEEN   Final   Culture   Final    NO GROWTH 2 DAYS Performed at [facility], [address], [place], [place] [address]    Report Status PENDING  Incomplete  Aerobic/Anaerobic Culture w Gram Stain (surgical/deep wound)     Status: None (Preliminary result)   Collection Time: 11/04/22  9:15 AM   Specimen: PATH Digit amputation; Tissue  Result Value Ref Range Status   Specimen Description BONE  Final   Special Requests RIGHT 3RD TOE  Final   Gram Stain   Final    RARE GRAM POSITIVE COCCI FEW GRAM NEGATIVE RODS NO WBC SEEN    Culture   Final    RARE PROTEUS PENNERI FEW GROUP B STREP(S.AGALACTIAE)ISOLATED TESTING AGAINST S. AGALACTIAE NOT ROUTINELY PERFORMED DUE TO PREDICTABILITY OF AMP/PEN/VAN SUSCEPTIBILITY. Performed at [facility], [place] [address] [place], [place] [address]    Report Status PENDING  Incomplete   Imaging US ARTERIAL ABI (SCREENING LOWER EXTREMITY)  Result Date: 10/31/2022 CLINICAL DATA:  Right foot ulcer. EXAM: NONINVASIVE PHYSIOLOGIC VASCULAR STUDY OF BILATERAL LOWER EXTREMITIES TECHNIQUE: Evaluation of both lower extremities were performed at rest, including calculation of ankle-brachial indices with single level Doppler, pressure and pulse volume recording. COMPARISON:  None Available. FINDINGS: Right ABI:  1.12 Left ABI:  0.74 Right Lower Extremity: Monophasic posterior tibial and dorsalis pedis waveforms. Left Lower Extremity: Monophasic/near biphasic posterior tibial and dorsalis pedis waveforms. IMPRESSION: 1. Normal resting right ankle-brachial index with distal waveform abnormalities  suggestive of some degree of tibial disease. 2. 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 Electronically Signed   By: Aletta Edouard M.D.   On: 10/31/2022 13:25   DG Foot Complete Right  Result Date: 10/30/2022 CLINICAL DATA:  Foot pain, swelling and fever. Concern for osteomyelitis. EXAM: RIGHT FOOT COMPLETE - 3+ VIEW COMPARISON:  No prior right foot exams. FINDINGS: The third toe alignment is distorted which limits assessment. Hammertoe deformity of the second and fourth toes. Osteoarthritis of the first metatarsal phalangeal joint. No fracture. No periostitis or frank  erosive change. Vascular calcifications are seen. Soft tissue edema overlies the dorsum of the foot. No soft tissue gas or radiopaque foreign body. IMPRESSION: 1. Soft tissue edema overlies the dorsum of the foot. No soft tissue gas or radiopaque foreign body. 2. No radiographic findings of osteomyelitis. Distorted third toe alignment which limits assessment. 3. Hammertoe deformity of the second and fourth toes. Electronically Signed   By: Keith Rake M.D.   On: 10/30/2022 20:56      Time coordinating discharge: over 30 minutes  SIGNED:  Emeterio Reeve DO [facility]

## 2022-11-06 NOTE — TOC Transition Note (Signed)
Transition of Care [facility]) - CM/SW Discharge Note   Patient Details  Name: Tyler [name] MRN: [id number] Date of Birth: Apr 11, 1946  Transition of Care [facility]) CM/SW Contact:  Beverly Sessions, RN Phone Number: 11/06/2022, 10:42 AM   Clinical Narrative:    Patient to discharge today on oral antibiotics Roommate to transport at discharge Declines the need for home health          Patient Goals and CMS Choice      Discharge Placement                         Discharge Plan and Services Additional resources added to the After Visit Summary for                                       Social Determinants of Health (SDOH) Interventions SDOH Screenings   Food Insecurity: No Food Insecurity (10/30/2022)  Housing: Low Risk  (10/30/2022)  Transportation Needs: No Transportation Needs (10/30/2022)  Utilities: Not At Risk (10/30/2022)  Tobacco Use: Low Risk  (11/05/2022)     Readmission Risk Interventions     No data to display

## 2022-11-09 LAB — AEROBIC/ANAEROBIC CULTURE W GRAM STAIN (SURGICAL/DEEP WOUND)
Culture: NO GROWTH
Gram Stain: NONE SEEN

## 2022-11-11 LAB — AEROBIC/ANAEROBIC CULTURE W GRAM STAIN (SURGICAL/DEEP WOUND)

## 2022-11-12 ENCOUNTER — Telehealth: Payer: Self-pay | Admitting: *Deleted

## 2022-11-12 NOTE — Telephone Encounter (Signed)
Tyler [name] , case manager with [facility] [phone] calling for patient's last visit and is he showed for appointment, returned call and left voice message that the patient is scheduled for 11/13/22, to call back for further information

## 2022-11-13 ENCOUNTER — Ambulatory Visit (INDEPENDENT_AMBULATORY_CARE_PROVIDER_SITE_OTHER): Payer: BC Managed Care – PPO

## 2022-11-13 ENCOUNTER — Encounter: Payer: Self-pay | Admitting: Podiatry

## 2022-11-13 ENCOUNTER — Ambulatory Visit (INDEPENDENT_AMBULATORY_CARE_PROVIDER_SITE_OTHER): Payer: BC Managed Care – PPO | Admitting: Podiatry

## 2022-11-13 VITALS — BP 151/79 | HR 89

## 2022-11-13 DIAGNOSIS — Z9889 Other specified postprocedural states: Secondary | ICD-10-CM

## 2022-11-13 DIAGNOSIS — L03115 Cellulitis of right lower limb: Secondary | ICD-10-CM

## 2022-11-13 MED ORDER — CIPROFLOXACIN HCL 500 MG PO TABS: 500.0000 mg | ORAL_TABLET | Freq: Two times a day (BID) | ORAL | 0 refills | Status: DC

## 2022-11-13 MED ORDER — DOXYCYCLINE HYCLATE 100 MG PO TABS: 100.0000 mg | ORAL_TABLET | Freq: Two times a day (BID) | ORAL | 0 refills | Status: DC

## 2022-11-13 NOTE — Progress Notes (Signed)
Chief Complaint  Patient presents with   Routine Post Op    "It's good, as far as I know."    Subjective:  Patient presents today status post right third toe amputation that was performed inpatient on 11/04/2022 secondary to osteomyelitis of the toe.  Patient doing well.  Dressings are clean dry and intact with some serous strikethrough around the amputation site.  He is currently WBAT surgical shoe.  Past Medical History:  Diagnosis Date   A-fib [facility])    Arthritis    Diabetes mellitus without complication ([place])    Presence of permanent cardiac pacemaker    Sick sinus syndrome ([place])    Urinary retention    chronic, with indwelling Foley catheter and plans for a suprapubic   Wears dentures    full upper    Past Surgical History:  Procedure Laterality Date   AMPUTATION TOE Right 11/04/2022   Procedure: AMPUTATION TOE;  Surgeon: Edrick Kins, [name];  [name];  Service: Podiatry;  Laterality: Right;   CATARACT EXTRACTION W/PHACO Right 01/16/2017   Procedure: CATARACT EXTRACTION PHACO AND INTRAOCULAR LENS PLACEMENT ([place])  Right Complicated;  [name], MD;  Location: [place];  Service: Ophthalmology;  Laterality: Right;  IVA Block Healon 5 malyugin vision blue Diabetic - oral meds   CATARACT EXTRACTION W/PHACO Left 10/23/2022   Procedure: CATARACT EXTRACTION PHACO AND INTRAOCULAR LENS PLACEMENT ([place]) LEFT DIABETIC;  [name], MD;  Location: [place];  Service: Ophthalmology;  Laterality: Left;  Diabetic   INSERT / REPLACE / REMOVE PACEMAKER  09/15/2020   [employer] [id number]   LOWER EXTREMITY ANGIOGRAPHY Right 11/02/2022   Procedure: Lower Extremity Angiography;  Surgeon: Algernon Huxley, MD;  Location: [place] [facility];  Service: Cardiovascular;  Laterality: Right;   METATARSAL HEAD EXCISION Left 07/05/2018   Procedure: RESECTION FIRST METATARSAL INFECTED BONE AND SOFT TISSUE;  [name], [name];   [name];  Service: Podiatry;  Laterality: Left;   TOE AMPUTATION Left    TONSILLECTOMY      No Known Allergies    Objective/Physical Exam Skin is warm to touch.  Incision is coapted with sutures intact however there is a significant amount of maceration from the amputation site and the skin around the amputation site.  Mild malodor.  No dehiscence however.  Radiographic Exam RT foot 11/13/2022:  Absence of the right third toe noted at the level of the MTP.  There does appear to be some erosions also at the medial aspect of the base of the second proximal phalanx at the level of the MTP.  Will observe for now.  Degenerative changes also noted with joint space narrowing and periarticular spurring to the first MTP.  No acute fractures identified.  No gas within the tissue.  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             Right Lower Extremity:  This demonstrated fairly normal common femoral artery with small and diseased profunda femoris artery.  The SFA was fairly normal proximally, but in the midsegment there was about a 90% stenosis in the SFA.  The vessel normalized down through Hunter's canal and in the popliteal artery just above the knee was another stenosis in the 70%  range.  The anterior tibial artery was really the only runoff vessel [place] and was continuous into the foot, but there were multiple areas of stenosis including about an 80 to 90% stenosis in the proximal segment, a 70 to 80% stenosis in the midsegment, and about a 70% stenosis in the distal segment just above the ankle.  The posterior tibial and peroneal arteries did add collateral flow in the distal segments with reconstitution at the ankle but there was a long occlusion of the tibioperoneal trunk and the peroneal and  posterior tibial arteries in the proximal and mid segments.   Assessment: 1. s/p right third toe amputation. DOS: 11/04/2022 performed inpatient -Patient evaluated.  X-rays reviewed -Continue WBAT surgical shoe -Betadine wet-to-dry dressings applied.  Supplies provided for the patient to apply Betadine wet-to-dry dressings every other day or daily.  Patient states that he is able to change his own dressings.  He would rather not have a nurse come to the home -New surgical shoe was dispensed today.  The current shoe the patient is wearing his toes are hanging over the edge. -Prescription for ciprofloxacin 500 mg 2 times daily #20 based on cultures and sensitivities taken IntraOp -Return to clinic 1 week   Edrick Kins, [name] [facility]  Dr. Edrick Kins, [name]    [address],  [address]                Office [phone]  Fax [phone]

## 2022-11-20 ENCOUNTER — Ambulatory Visit (INDEPENDENT_AMBULATORY_CARE_PROVIDER_SITE_OTHER): Payer: BC Managed Care – PPO | Admitting: Podiatry

## 2022-11-20 ENCOUNTER — Encounter: Payer: Self-pay | Admitting: Podiatry

## 2022-11-20 ENCOUNTER — Other Ambulatory Visit: Payer: Self-pay | Admitting: Podiatry

## 2022-11-20 VITALS — BP 119/59 | HR 80

## 2022-11-20 DIAGNOSIS — Z9889 Other specified postprocedural states: Secondary | ICD-10-CM

## 2022-11-20 NOTE — Progress Notes (Signed)
Chief Complaint  Patient presents with   Routine Post Op    "It's doing good."    Subjective:  Patient presents today status post right third toe amputation that was performed inpatient on 11/04/2022 secondary to osteomyelitis of the toe.  Continues to do well.  He continues WBAT surgical shoe. No new complaints at this time  Past Medical History:  Diagnosis Date   A-fib [facility])    Arthritis    Diabetes mellitus without complication ([place])    Presence of permanent cardiac pacemaker    Sick sinus syndrome ([place])    Urinary retention    chronic, with indwelling Foley catheter and plans for a suprapubic   Wears dentures    full upper    Past Surgical History:  Procedure Laterality Date   AMPUTATION TOE Right 11/04/2022   Procedure: AMPUTATION TOE;  Surgeon: Edrick Kins, [name];  [name];  Service: Podiatry;  Laterality: Right;   CATARACT EXTRACTION W/PHACO Right 01/16/2017   Procedure: CATARACT EXTRACTION PHACO AND INTRAOCULAR LENS PLACEMENT ([place])  Right Complicated;  [name], MD;  Location: [place];  Service: Ophthalmology;  Laterality: Right;  IVA Block Healon 5 malyugin vision blue Diabetic - oral meds   CATARACT EXTRACTION W/PHACO Left 10/23/2022   Procedure: CATARACT EXTRACTION PHACO AND INTRAOCULAR LENS PLACEMENT ([place]) LEFT DIABETIC;  [name], MD;  Location: [place];  Service: Ophthalmology;  Laterality: Left;  Diabetic   INSERT / REPLACE / REMOVE PACEMAKER  09/15/2020   Medtronic [id number]   LOWER EXTREMITY ANGIOGRAPHY Right 11/02/2022   Procedure: Lower Extremity Angiography;  Surgeon: Algernon Huxley, MD;  Location: [place] [facility];  Service: Cardiovascular;  Laterality: Right;   METATARSAL HEAD EXCISION Left 07/05/2018   Procedure: RESECTION FIRST METATARSAL INFECTED BONE AND SOFT TISSUE;  [name], [name];  [name];  Service: Podiatry;  Laterality: Left;   TOE AMPUTATION Left     TONSILLECTOMY      No Known Allergies   RT foot 11/13/2022   Objective/Physical Exam Skin is warm to touch.  Incision is coapted with sutures intact however there is a significant amount of maceration from the amputation site and the skin around the amputation site.  Mild malodor.  No dehiscence however.  Radiographic Exam RT foot 11/13/2022:  Absence of the right third toe noted at the level of the MTP.  There does appear to be some erosions also at the medial aspect of the base of the second proximal phalanx at the level of the MTP.  Will observe for now.  Degenerative changes also noted with joint space narrowing and periarticular spurring to the first MTP.  No acute fractures identified.  No gas within the tissue.  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             Right Lower Extremity:  This demonstrated fairly normal common femoral artery with small and diseased profunda femoris artery.  The SFA was fairly normal proximally, but in the midsegment there was about a 90% stenosis in the SFA.  The vessel normalized down through Hunter's canal and in the popliteal artery just above the knee was another stenosis in the 70% range.  The anterior tibial artery was really the  only runoff vessel Stilley and was continuous into the foot, but there were multiple areas of stenosis including about an 80 to 90% stenosis in the proximal segment, a 70 to 80% stenosis in the midsegment, and about a 70% stenosis in the distal segment just above the ankle.  The posterior tibial and peroneal arteries did add collateral flow in the distal segments with reconstitution at the ankle but there was a long occlusion of the tibioperoneal trunk and the peroneal and posterior tibial arteries in the proximal and mid segments.    Assessment: 1. s/p right third toe amputation. DOS: 11/04/2022 performed inpatient -Patient evaluated.   -Continue Betadine wet-to-dry dressings daily -Continue WBAT surgical shoe -Return to clinic 1 week  Edrick Kins, [name] [facility]  Dr. Edrick Kins, [name]    [address], [place] [address]                Office [phone]  Fax [phone]

## 2022-11-26 DIAGNOSIS — I429 Cardiomyopathy, unspecified: Secondary | ICD-10-CM | POA: Diagnosis not present

## 2022-11-27 ENCOUNTER — Encounter: Payer: Self-pay | Admitting: Podiatry

## 2022-11-27 ENCOUNTER — Encounter: Payer: BC Managed Care – PPO | Admitting: Podiatry

## 2022-11-27 ENCOUNTER — Ambulatory Visit (INDEPENDENT_AMBULATORY_CARE_PROVIDER_SITE_OTHER): Payer: BC Managed Care – PPO | Admitting: Podiatry

## 2022-11-27 DIAGNOSIS — Z9889 Other specified postprocedural states: Secondary | ICD-10-CM

## 2022-11-27 MED ORDER — CIPROFLOXACIN HCL 500 MG PO TABS: 500.0000 mg | ORAL_TABLET | Freq: Two times a day (BID) | ORAL | 0 refills | Status: DC

## 2022-11-27 NOTE — Progress Notes (Signed)
Chief Complaint  Patient presents with   Routine Post Op    "It's doing good."    Subjective:  Patient presents today status post right third toe amputation that was performed inpatient on 11/04/2022 secondary to osteomyelitis of the toe.  Continues to do well.  He continues WBAT surgical shoe.  He has noticed improvement over the past week.  He continues applying Betadine wet-to-dry dressings to the amputation site.  No new complaints at this time  Past Medical History:  Diagnosis Date   A-fib [facility])    Arthritis    Diabetes mellitus without complication ([place])    Presence of permanent cardiac pacemaker    Sick sinus syndrome ([place])    Urinary retention    chronic, with indwelling Foley catheter and plans for a suprapubic   Wears dentures    full upper    Past Surgical History:  Procedure Laterality Date   AMPUTATION TOE Right 11/04/2022   Procedure: AMPUTATION TOE;  Surgeon: Edrick Kins, [name];  [name];  Service: Podiatry;  Laterality: Right;   CATARACT EXTRACTION W/PHACO Right 01/16/2017   Procedure: CATARACT EXTRACTION PHACO AND INTRAOCULAR LENS PLACEMENT ([place])  Right Complicated;  [name], MD;  Location: [place];  Service: Ophthalmology;  Laterality: Right;  IVA Block Healon 5 malyugin vision blue Diabetic - oral meds   CATARACT EXTRACTION W/PHACO Left 10/23/2022   Procedure: CATARACT EXTRACTION PHACO AND INTRAOCULAR LENS PLACEMENT ([place]) LEFT DIABETIC;  [name], MD;  Location: [place];  Service: Ophthalmology;  Laterality: Left;  Diabetic   INSERT / REPLACE / REMOVE PACEMAKER  09/15/2020   Medtronic [id number]   LOWER EXTREMITY ANGIOGRAPHY Right 11/02/2022   Procedure: Lower Extremity Angiography;  Surgeon: Algernon Huxley, MD;  Location: [place] [facility];  Service: Cardiovascular;  Laterality: Right;   METATARSAL HEAD EXCISION Left 07/05/2018   Procedure: RESECTION FIRST METATARSAL INFECTED BONE AND SOFT  TISSUE;  [name], [name];  [name];  Service: Podiatry;  Laterality: Left;   TOE AMPUTATION Left    TONSILLECTOMY      No Known Allergies   RT foot 11/13/2022   Objective/Physical Exam Skin is warm to touch.  Incisions are coapted with sutures intact.  Significant reduction of the amount of drainage and maceration to the surgical area.  Overall significant improvement  Radiographic Exam RT foot 11/13/2022:  Absence of the right third toe noted at the level of the MTP.  There does appear to be some erosions also at the medial aspect of the base of the second proximal phalanx at the level of the MTP.  Will observe for now.  Degenerative changes also noted with joint space narrowing and periarticular spurring to the first MTP.  No acute fractures identified.  No gas within the tissue.  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             Right Lower Extremity:  This demonstrated fairly normal common femoral artery with small and diseased profunda femoris artery.  The SFA was fairly normal proximally, but in the midsegment there was about a 90% stenosis in the SFA.  The vessel normalized down through Hunter's canal and in the popliteal artery just above the knee was another  stenosis in the 70% range.  The anterior tibial artery was really the only runoff vessel Stilley and was continuous into the foot, but there were multiple areas of stenosis including about an 80 to 90% stenosis in the proximal segment, a 70 to 80% stenosis in the midsegment, and about a 70% stenosis in the distal segment just above the ankle.  The posterior tibial and peroneal arteries did add collateral flow in the distal segments with reconstitution at the ankle but there was a long occlusion of the tibioperoneal  trunk and the peroneal and posterior tibial arteries in the proximal and mid segments.   Assessment: 1. s/p right third toe amputation. DOS: 11/04/2022 performed inpatient -Patient evaluated.   -Continue Betadine wet-to-dry dressings daily -Continue WBAT surgical shoe - Refill prescription for ciprofloxacin 500 mg BID #20  -Return to clinic 1 week for suture removal  Edrick Kins, [name] [facility]  Dr. Edrick Kins, [name]    [address], [place] [address]                Office [phone]  Fax [phone]

## 2022-11-29 ENCOUNTER — Telehealth: Payer: Self-pay | Admitting: *Deleted

## 2022-11-29 NOTE — Telephone Encounter (Signed)
Patient is calling for the status of his prescription , explained that it was sent to pharmacy on the 5 th, verbalized understanding , wanted to let the physician know that he is really doing good, so much better in years and has told his friends about [place].

## 2022-12-11 ENCOUNTER — Ambulatory Visit (INDEPENDENT_AMBULATORY_CARE_PROVIDER_SITE_OTHER): Payer: BC Managed Care – PPO | Admitting: Podiatry

## 2022-12-11 ENCOUNTER — Encounter: Payer: Self-pay | Admitting: Podiatry

## 2022-12-11 VITALS — BP 167/94 | HR 76

## 2022-12-11 DIAGNOSIS — Z9889 Other specified postprocedural states: Secondary | ICD-10-CM

## 2022-12-11 DIAGNOSIS — E119 Type 2 diabetes mellitus without complications: Secondary | ICD-10-CM

## 2022-12-11 MED ORDER — CIPROFLOXACIN HCL 500 MG PO TABS: 500.0000 mg | ORAL_TABLET | Freq: Two times a day (BID) | ORAL | 0 refills | Status: DC

## 2022-12-11 NOTE — Progress Notes (Signed)
Chief Complaint  Patient presents with   Routine Post Op    "I think it's doing good."    Subjective:  Patient presents today status post right third toe amputation that was performed inpatient on 11/04/2022 secondary to osteomyelitis of the toe.  Continues to do well.  He continues WBAT surgical shoe.  Patient continues to notice improvement.  He says that he is feeling well.  No new complaints at this time.  Patient also presents for routine diabetic foot exam  Past Medical History:  Diagnosis Date   A-fib [facility])    Arthritis    Diabetes mellitus without complication ([place])    Presence of permanent cardiac pacemaker    Sick sinus syndrome ([place])    Urinary retention    chronic, with indwelling Foley catheter and plans for a suprapubic   Wears dentures    full upper    Past Surgical History:  Procedure Laterality Date   AMPUTATION TOE Right 11/04/2022   Procedure: AMPUTATION TOE;  Surgeon: Edrick Kins, [name];  [name];  Service: Podiatry;  Laterality: Right;   CATARACT EXTRACTION W/PHACO Right 01/16/2017   Procedure: CATARACT EXTRACTION PHACO AND INTRAOCULAR LENS PLACEMENT ([place])  Right Complicated;  [name], MD;  Location: [place];  Service: Ophthalmology;  Laterality: Right;  IVA Block Healon 5 malyugin vision blue Diabetic - oral meds   CATARACT EXTRACTION W/PHACO Left 10/23/2022   Procedure: CATARACT EXTRACTION PHACO AND INTRAOCULAR LENS PLACEMENT ([place]) LEFT DIABETIC;  [name], MD;  Location: [place];  Service: Ophthalmology;  Laterality: Left;  Diabetic   INSERT / REPLACE / REMOVE PACEMAKER  09/15/2020   Medtronic [id number]   LOWER EXTREMITY ANGIOGRAPHY Right 11/02/2022   Procedure: Lower Extremity Angiography;  Surgeon: Algernon Huxley, MD;  Location: [place] [facility];  Service: Cardiovascular;  Laterality: Right;   METATARSAL HEAD EXCISION Left 07/05/2018   Procedure: RESECTION FIRST METATARSAL INFECTED  BONE AND SOFT TISSUE;  [name], [name];  [name];  Service: Podiatry;  Laterality: Left;   TOE AMPUTATION Left    TONSILLECTOMY      No Known Allergies   RT foot 11/13/2022   Objective/Physical Exam Skin is warm to touch.  Incisions are coapted with sutures intact.  Significant reduction of the amount of drainage and maceration to the surgical area.  Overall significant improvement  Skin is warm to touch.  Capillary refill immediate to the remaining digits.  Light touch and protective threshold grossly intact.  Muscle strength 5/5 all compartments.  Radiographic Exam RT foot 11/13/2022:  Absence of the right third toe noted at the level of the MTP.  There does appear to be some erosions also at the medial aspect of the base of the second proximal phalanx at the level of the MTP.  Will observe for now.  Degenerative changes also noted with joint space narrowing and periarticular spurring to the first MTP.  No acute fractures identified.  No gas within the tissue.  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             Right Lower Extremity:  This demonstrated fairly normal common femoral artery with small and diseased profunda femoris artery.  The SFA was fairly normal  proximally, but in the midsegment there was about a 90% stenosis in the SFA.  The vessel normalized down through Hunter's canal and in the popliteal artery just above the knee was another stenosis in the 70% range.  The anterior tibial artery was really the only runoff vessel Stilley and was continuous into the foot, but there were multiple areas of stenosis including about an 80 to 90% stenosis in the proximal segment, a 70 to 80% stenosis in the midsegment, and about a 70% stenosis in the distal segment just above the  ankle.  The posterior tibial and peroneal arteries did add collateral flow in the distal segments with reconstitution at the ankle but there was a long occlusion of the tibioperoneal trunk and the peroneal and posterior tibial arteries in the proximal and mid segments.   Assessment: 1. s/p right third toe amputation. DOS: 11/04/2022 performed inpatient  -Patient evaluated.  Comprehensive diabetic foot exam performed today -Remaining sutures removed -Patient states that he only received 14 pills of the ciprofloxacin.  Refill provided -Patient may now transition out of the postoperative shoe into good new balance walking shoes -Return to clinic in 4 weeks  Edrick Kins, [name] [facility]  Dr. Edrick Kins, [name]    [address], [place] [address]                Office [phone]  Fax [phone]

## 2023-01-15 ENCOUNTER — Ambulatory Visit (INDEPENDENT_AMBULATORY_CARE_PROVIDER_SITE_OTHER): Payer: BC Managed Care – PPO

## 2023-01-15 ENCOUNTER — Ambulatory Visit (INDEPENDENT_AMBULATORY_CARE_PROVIDER_SITE_OTHER): Payer: BC Managed Care – PPO | Admitting: Podiatry

## 2023-01-15 DIAGNOSIS — M86671 Other chronic osteomyelitis, right ankle and foot: Secondary | ICD-10-CM

## 2023-01-15 DIAGNOSIS — Z9889 Other specified postprocedural states: Secondary | ICD-10-CM

## 2023-01-15 NOTE — Progress Notes (Addendum)
Chief Complaint  Patient presents with   Routine Post Op    POV#5 right 3rd toe amputation, X-Rays done today     Subjective:  Patient presents today status post right third toe amputation that was performed inpatient on 11/04/2022 secondary to osteomyelitis of the toe.  Patient is doing well.  WBAT in a regular shoe.  He basically has been full activity since last visit.  No new complaints  Past Medical History:  Diagnosis Date   A-fib ([facility])    Arthritis    Diabetes mellitus without complication ([facility])    Presence of permanent cardiac pacemaker    Sick sinus syndrome ([facility])    Urinary retention    chronic, with indwelling Foley catheter and plans for a suprapubic   Wears dentures    full upper    Past Surgical History:  Procedure Laterality Date   AMPUTATION TOE Right 11/04/2022   Procedure: AMPUTATION TOE;  Surgeon: Felecia Shelling, [name];  [name];  Service: Podiatry;  Laterality: Right;   CATARACT EXTRACTION W/PHACO Right 01/16/2017   Procedure: CATARACT EXTRACTION PHACO AND INTRAOCULAR LENS PLACEMENT ([facility])  Right Complicated;  Surgeon: Lockie Mola, MD;  Location: [facility];  Service: Ophthalmology;  Laterality: Right;  IVA Block Healon 5 malyugin vision blue Diabetic - oral meds   CATARACT EXTRACTION W/PHACO Left 10/23/2022   Procedure: CATARACT EXTRACTION PHACO AND INTRAOCULAR LENS PLACEMENT ([place]) LEFT DIABETIC;  Surgeon: Galen Manila, MD;  Location: [facility];  Service: Ophthalmology;  Laterality: Left;  Diabetic   INSERT / REPLACE / REMOVE PACEMAKER  09/15/2020   Medtronic [id number]   LOWER EXTREMITY ANGIOGRAPHY Right 11/02/2022   Procedure: Lower Extremity Angiography;  Surgeon: Annice Needy, MD;  Location: [facility];  Service: Cardiovascular;  Laterality: Right;   METATARSAL HEAD EXCISION Left 07/05/2018   Procedure: RESECTION FIRST METATARSAL INFECTED BONE AND SOFT TISSUE;  Surgeon: Recardo Evangelist, [name];  Location: [facility];  Service: Podiatry;  Laterality: Left;   TOE AMPUTATION Left    TONSILLECTOMY      No Known Allergies   RT foot 11/13/2022   Objective/Physical Exam Skin is warm to touch.  Incision nicely healed.  No erythema.  There continues to be chronic moderate edema to the foot and lower extremity in general.  Radiographic Exam RT foot 11/13/2022:  Absence of the right third toe noted at the level of the MTP.  There does appear to be some erosions also at the medial aspect of the base of the second proximal phalanx at the level of the MTP.  Will observe for now.  Degenerative changes also noted with joint space narrowing and periarticular spurring to the first MTP.  No acute fractures identified.  No gas within the tissue.  Radiographic exam RT foot 01/15/2023: New radiographic findings of erosions of the third metatarsal head as well as questionable erosion around the fourth MTP.  Concerning for residual osteomyelitis within the forefoot.  No acute fractures identified.  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             Right Lower Extremity:  This demonstrated fairly normal common femoral artery with small and diseased profunda femoris artery.  The SFA was fairly normal proximally, but in the  midsegment there was about a 90% stenosis in the SFA.  The vessel normalized down through Hunter's canal and in the popliteal artery just above the knee was another stenosis in the 70% range.  The anterior tibial artery was really the only runoff vessel Stilley and was continuous into the foot, but there were multiple areas of stenosis including about an 80 to 90% stenosis in the proximal segment, a 70 to 80% stenosis in the midsegment, and about a 70% stenosis in the distal segment just above the ankle.  The posterior  tibial and peroneal arteries did add collateral flow in the distal segments with reconstitution at the ankle but there was a long occlusion of the tibioperoneal trunk and the peroneal and posterior tibial arteries in the proximal and mid segments.   Assessment: 1. s/p right third toe amputation. DOS: 11/04/2022 performed inpatient 2.  Concern for residual osteomyelitis third metatarsal head and fourth MTP  -Patient evaluated.  X-rays reviewed and compared to prior x-rays with the patient. -New MRI ordered right foot due to concern for residual osteomyelitis. -Advised the patient to go immediately to the emergency department if he notices an increase in swelling or erythema of the foot or systemic symptoms of infection including nausea vomiting fever or overall not feeling well -Will plan to have the patient return to the office once MRI is complete to discuss the findings and treatment options including additional surgery versus long-term IV antibiotics for above.  Felecia Shelling, [name] [facility]  Dr. Felecia Shelling, [name]    [address] [place], [place] [address]                Office [phone]  Fax [phone]

## 2023-01-15 NOTE — Addendum Note (Signed)
Addended by: Felecia Shelling on: 01/15/2023 08:31 AM   Modules accepted: Orders

## 2023-01-22 ENCOUNTER — Other Ambulatory Visit: Payer: Self-pay | Admitting: Podiatry

## 2023-01-22 DIAGNOSIS — Z9889 Other specified postprocedural states: Secondary | ICD-10-CM

## 2023-01-22 DIAGNOSIS — M86671 Other chronic osteomyelitis, right ankle and foot: Secondary | ICD-10-CM

## 2023-01-28 ENCOUNTER — Encounter: Payer: Self-pay | Admitting: Podiatry

## 2023-01-30 ENCOUNTER — Ambulatory Visit
Admission: RE | Admit: 2023-01-30 | Discharge: 2023-01-30 | Disposition: A | Discharge status: Home / Self Care | Payer: BC Managed Care – PPO | Source: Ambulatory Visit | Attending: Podiatry | Admitting: Podiatry

## 2023-01-30 DIAGNOSIS — M86671 Other chronic osteomyelitis, right ankle and foot: Secondary | ICD-10-CM

## 2023-01-31 ENCOUNTER — Telehealth: Payer: Self-pay | Admitting: Podiatry

## 2023-01-31 ENCOUNTER — Other Ambulatory Visit: Payer: Self-pay | Admitting: Podiatry

## 2023-01-31 DIAGNOSIS — M86671 Other chronic osteomyelitis, right ankle and foot: Secondary | ICD-10-CM

## 2023-01-31 MED ORDER — AMOXICILLIN-POT CLAVULANATE 875-125 MG PO TABS: 1.0000 | ORAL_TABLET | Freq: Two times a day (BID) | ORAL | 0 refills | Status: DC

## 2023-01-31 NOTE — Telephone Encounter (Signed)
Pt called and is requesting some antibiotics to be called in still has the infection.the pharmacy is correct in the chart.I told him I would send to Dr Logan Bores and provider on call since Dr Logan Bores is in surgery today.   Also he stated he went to the appt for the mri and they could not do the mri because he has a pacemaker. They recommended pt get a ct scan instead.  He asked if he should just come in to the office today and I explained that Dr Logan Bores is not in the office today he is in surgery and I would send him a message to see what he wanted to do.   Pt is scheduled to see Dr Logan Bores for mri results 5.14 and we may need to cxl but waiting to hear back from Dr Logan Bores on that.

## 2023-01-31 NOTE — Progress Notes (Signed)
Discontinue MRI order due to pacemaker. Order placed for CT w/ contrast at [facility].   Felecia Shelling, [name] [facility]  Dr. Felecia Shelling, [name]    [address] [place], [place] [address]                Office [phone]  Fax [phone]

## 2023-02-01 NOTE — Telephone Encounter (Signed)
Called pt to let him know Dr Logan Bores has ordered the ct scan and he should be getting a call to schedule it.

## 2023-02-01 NOTE — Telephone Encounter (Signed)
Could not leave a message as phone just rings.

## 2023-02-04 ENCOUNTER — Telehealth: Payer: Self-pay | Admitting: Podiatry

## 2023-02-04 NOTE — Telephone Encounter (Signed)
Dr office calling stating they need the order for Ct to be revised.   Please advise

## 2023-02-05 ENCOUNTER — Ambulatory Visit (INDEPENDENT_AMBULATORY_CARE_PROVIDER_SITE_OTHER): Payer: BC Managed Care – PPO | Admitting: Podiatry

## 2023-02-05 DIAGNOSIS — M86671 Other chronic osteomyelitis, right ankle and foot: Secondary | ICD-10-CM | POA: Diagnosis not present

## 2023-02-05 NOTE — Progress Notes (Signed)
Chief Complaint  Patient presents with   Routine Post Op    POV#6 right 3rd toe amputation, patient denies any pain, patient states he is doing great,     Subjective:  Patient presents today status post right third toe amputation that was performed inpatient on 11/04/2022 secondary to osteomyelitis of the toe.  Patient is doing well and has been weightbearing as tolerated.  Last visit there was concern for erosive changes on x-ray.  CT scan with contrast pending.  MRI advised against since the patient has a pacemaker.  Overall he says that he is feeling much better and he believes the foot appears to be much improved  Past Medical History:  Diagnosis Date   A-fib (HCC)    Arthritis    Diabetes mellitus without complication (HCC)    Presence of permanent cardiac pacemaker    Sick sinus syndrome (HCC)    Urinary retention    chronic, with indwelling Foley catheter and plans for a suprapubic   Wears dentures    full upper    Past Surgical History:  Procedure Laterality Date   AMPUTATION TOE Right 11/04/2022   Procedure: AMPUTATION TOE;  Surgeon: Felecia Shelling, [name];  [name];  Service: Podiatry;  Laterality: Right;   CATARACT EXTRACTION W/PHACO Right 01/16/2017   Procedure: CATARACT EXTRACTION PHACO AND INTRAOCULAR LENS PLACEMENT (IOC)  Right Complicated;  Surgeon: Lockie Mola, MD;  Location: [facility];  Service: Ophthalmology;  Laterality: Right;  IVA Block Healon 5 malyugin vision blue Diabetic - oral meds   CATARACT EXTRACTION W/PHACO Left 10/23/2022   Procedure: CATARACT EXTRACTION PHACO AND INTRAOCULAR LENS PLACEMENT (IOC) LEFT DIABETIC;  Surgeon: Galen Manila, MD;  Location: [facility];  Service: Ophthalmology;  Laterality: Left;  Diabetic   INSERT / REPLACE / REMOVE PACEMAKER  09/15/2020   Medtronic [id number]   LOWER EXTREMITY ANGIOGRAPHY Right 11/02/2022   Procedure: Lower Extremity Angiography;  Surgeon: Annice Needy, MD;  Location: [facility]  INVASIVE CV LAB;  Service: Cardiovascular;  Laterality: Right;   METATARSAL HEAD EXCISION Left 07/05/2018   Procedure: RESECTION FIRST METATARSAL INFECTED BONE AND SOFT TISSUE;  Surgeon: Recardo Evangelist, [name];  [name];  Service: Podiatry;  Laterality: Left;   TOE AMPUTATION Left    TONSILLECTOMY      No Known Allergies   Objective/Physical Exam Skin is warm to touch.  Incision nicely healed.  No erythema.  There continues to be chronic moderate edema to the foot and lower extremity in general as well as to the second toe  Radiographic Exam RT foot 11/13/2022:  Absence of the right third toe noted at the level of the MTP.  There does appear to be some erosions also at the medial aspect of the base of the second proximal phalanx at the level of the MTP.  Will observe for now.  Degenerative changes also noted with joint space narrowing and periarticular spurring to the first MTP.  No acute fractures identified.  No gas within the tissue.  Radiographic exam RT foot 01/15/2023: New radiographic findings of erosions of the third metatarsal head as well as questionable erosion around the fourth MTP.  Concerning for residual osteomyelitis within the forefoot.  No acute fractures identified.  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             Right Lower Extremity:  This demonstrated fairly normal common femoral artery with small and diseased profunda femoris artery.  The SFA was fairly normal proximally, but in the midsegment there was about a 90% stenosis in the SFA.  The vessel normalized down through Hunter's canal and in the popliteal artery just above the knee was another stenosis in the 70% range.  The anterior tibial artery was really the only runoff vessel Stilley and was continuous into the  foot, but there were multiple areas of stenosis including about an 80 to 90% stenosis in the proximal segment, a 70 to 80% stenosis in the midsegment, and about a 70% stenosis in the distal segment just above the ankle.  The posterior tibial and peroneal arteries did add collateral flow in the distal segments with reconstitution at the ankle but there was a long occlusion of the tibioperoneal trunk and the peroneal and posterior tibial arteries in the proximal and mid segments.   Assessment: 1. s/p right third toe amputation. DOS: 11/04/2022 performed inpatient 2.  Concern for residual osteomyelitis third metatarsal head and fourth MTP  -Patient evaluated.   -CT with contrast pending -Patient currently not on any oral antibiotics. -Will plan to simply observe for now. -Consider observation versus bone biopsy once CT scan is completed -Return to clinic after CT to review results and discuss next steps  Felecia Shelling, [name] [facility]  Dr. Felecia Shelling, [name]    [address] [place], [place] [address]                Office [phone]  Fax [phone]

## 2023-02-08 ENCOUNTER — Ambulatory Visit: Payer: Medicare Other | Admitting: Podiatry

## 2023-02-11 ENCOUNTER — Emergency Department
Admission: EM | Admit: 2023-02-11 | Discharge: 2023-02-11 | Disposition: A | Discharge status: Home / Self Care | Payer: BC Managed Care – PPO | Attending: Emergency Medicine | Admitting: Emergency Medicine

## 2023-02-11 ENCOUNTER — Other Ambulatory Visit: Payer: Self-pay

## 2023-02-11 DIAGNOSIS — R339 Retention of urine, unspecified: Secondary | ICD-10-CM | POA: Diagnosis not present

## 2023-02-11 DIAGNOSIS — T83098A Other mechanical complication of other indwelling urethral catheter, initial encounter: Secondary | ICD-10-CM | POA: Diagnosis not present

## 2023-02-11 DIAGNOSIS — T839XXA Unspecified complication of genitourinary prosthetic device, implant and graft, initial encounter: Secondary | ICD-10-CM

## 2023-02-11 DIAGNOSIS — Y69 Unspecified misadventure during surgical and medical care: Secondary | ICD-10-CM | POA: Diagnosis not present

## 2023-02-11 DIAGNOSIS — R338 Other retention of urine: Secondary | ICD-10-CM

## 2023-02-11 DIAGNOSIS — T83091A Other mechanical complication of indwelling urethral catheter, initial encounter: Secondary | ICD-10-CM | POA: Diagnosis not present

## 2023-02-11 NOTE — ED Provider Notes (Signed)
    [facility] Emergency Department Provider Note     Event Date/Time   First MD Initiated Contact with Patient 02/11/23 1508     (approximate)   History   Urinary Retention   HPI  Tyler [name] is a 77 y.o. male complains of urinary retention.  Patient with history of BPH is under the care of Dr. Lonna Cobb.  He has a chronic indwelling urinary catheter.  He reports decreased drainage into his leg bag since this morning.  He also reports some mild bladder distention.     Physical Exam   Triage Vital Signs: ED Triage Vitals  Enc Vitals Group     BP 02/11/23 1419 (!) 166/80     Pulse Rate 02/11/23 1419 100     Resp 02/11/23 1419 18     Temp 02/11/23 1419 98.2 F (36.8 C)     Temp Source 02/11/23 1419 Oral     SpO2 02/11/23 1419 96 %     Weight 02/11/23 1419 250 lb (113.4 kg)     Height 02/11/23 1419 6\' 6"  (1.981 m)     Head Circumference --      Peak Flow --      Pain Score 02/11/23 1422 4     Pain Loc --      Pain Edu? --      Excl. in GC? --     Most recent vital signs: Vitals:   02/11/23 1419  BP: (!) 166/80  Pulse: 100  Resp: 18  Temp: 98.2 F (36.8 C)  SpO2: 96%    General Awake, no distress. NAD CV:  Good peripheral perfusion.  RESP:  Normal effort.  ABD:  No distention.  GU:  Deferred.    ED Results / Procedures / Treatments   Labs (all labs ordered are listed, but only abnormal results are displayed) Labs Reviewed - No data to display   EKG   RADIOLOGY  No results found.   PROCEDURES:  Critical Care performed: No  Coud Foley catheter replaced  MEDICATIONS ORDERED IN ED: Medications - No data to display   IMPRESSION / MDM / ASSESSMENT AND PLAN / ED COURSE  I reviewed the triage vital signs and the nursing notes.                              Differential diagnosis includes, but is not limited to, BPH, Foley catheter dysfunction, acute functional urinary retention  Patient's presentation is most  consistent with acute, uncomplicated illness.  Patient's diagnosis is consistent with acute urinary retention with malfunctioning Foley catheter.  Patient is to follow up with Drs. Stoioff/Snisky as needed or otherwise directed. Patient is given ED precautions to return to the ED for any worsening or new symptoms.  FINAL CLINICAL IMPRESSION(S) / ED DIAGNOSES   Final diagnoses:  Acute urinary retention  Complication of Foley catheter, initial encounter (HCC)     Rx / DC Orders   ED Discharge Orders     None        Note:  This document was prepared using Dragon voice recognition software and may include unintentional dictation errors.    Lissa Hoard, PA-C 02/11/23 Candie Chroman, MD 02/12/23 [phone]

## 2023-02-11 NOTE — ED Provider Triage Note (Signed)
Emergency Medicine Provider Triage Evaluation Note  Tyler [name], a 77 y.o. male  was evaluated in triage.  Pt complains of urinary retention.  Patient with history of BPH is under the care of Dr. Lonna Cobb.  He has a chronic indwelling urinary catheter.  He reports decreased drainage into his leg bag since this morning.  He also reports some mild bladder distention.  Review of Systems  Positive: Urinary retention Negative: FCS  Physical Exam  BP (!) 166/80 (BP Location: Left Arm)   Pulse 100   Temp 98.2 F (36.8 C) (Oral)   Resp 18   Ht 6\' 6"  (1.981 m)   Wt 113.4 kg   SpO2 96%   BMI 28.89 kg/m  Gen:   Awake, no distress  NAD Resp:  Normal effort CTA MSK:   Moves extremities without difficulty  Other:  Bladder scan  Medical Decision Making  Medically screening exam initiated at 2:53 PM.  Appropriate orders placed.  Tyler [name] was informed that the remainder of the evaluation will be completed by another provider, this initial triage assessment does not replace that evaluation, and the importance of remaining in the ED until their evaluation is complete.  Geriatric patient with BPH and chronic foley catheter presents with urinary retention.    Lissa Hoard, PA-C 02/11/23 1500

## 2023-02-11 NOTE — ED Triage Notes (Signed)
Pt here with urinary retention. Pt states he has not had any output in his foley since this morning. Pt states he feels a little full.

## 2023-02-11 NOTE — Discharge Instructions (Addendum)
We have changed your Foley catheter and it seems to be functioning normally.  Follow-up with your primary provider and [facility] for ongoing concern.

## 2023-02-11 NOTE — ED Notes (Signed)
Pt states he is starting to feel much better since receiving a new foley catheter.

## 2023-02-21 DIAGNOSIS — E119 Type 2 diabetes mellitus without complications: Secondary | ICD-10-CM | POA: Diagnosis not present

## 2023-02-21 DIAGNOSIS — H3509 Other intraretinal microvascular abnormalities: Secondary | ICD-10-CM | POA: Diagnosis not present

## 2023-02-21 DIAGNOSIS — Z0100 Encounter for examination of eyes and vision without abnormal findings: Secondary | ICD-10-CM | POA: Diagnosis not present

## 2023-02-21 DIAGNOSIS — Z961 Presence of intraocular lens: Secondary | ICD-10-CM | POA: Diagnosis not present

## 2023-02-25 DIAGNOSIS — I495 Sick sinus syndrome: Secondary | ICD-10-CM | POA: Diagnosis not present

## 2023-03-01 ENCOUNTER — Ambulatory Visit
Admission: RE | Admit: 2023-03-01 | Discharge: 2023-03-01 | Disposition: A | Discharge status: Home / Self Care | Payer: BC Managed Care – PPO | Source: Ambulatory Visit | Attending: Podiatry | Admitting: Podiatry

## 2023-03-01 DIAGNOSIS — M2580 Other specified joint disorders, unspecified joint: Secondary | ICD-10-CM | POA: Diagnosis not present

## 2023-03-01 DIAGNOSIS — M19071 Primary osteoarthritis, right ankle and foot: Secondary | ICD-10-CM | POA: Diagnosis not present

## 2023-03-01 DIAGNOSIS — M86671 Other chronic osteomyelitis, right ankle and foot: Secondary | ICD-10-CM

## 2023-03-01 MED ORDER — IOPAMIDOL (ISOVUE-300) INJECTION 61%
100.0000 mL | Freq: Once | INTRAVENOUS | Status: AC | PRN
  Administered 2023-03-01: 100 mL via INTRAVENOUS

## 2023-03-05 ENCOUNTER — Ambulatory Visit (INDEPENDENT_AMBULATORY_CARE_PROVIDER_SITE_OTHER): Payer: BC Managed Care – PPO | Admitting: Podiatry

## 2023-03-05 DIAGNOSIS — M86671 Other chronic osteomyelitis, right ankle and foot: Secondary | ICD-10-CM

## 2023-03-05 NOTE — Progress Notes (Signed)
Chief Complaint  Patient presents with   Foot Pain    Patient came in today for right foot pain follow-up, CT results,     Subjective:  Patient presents today status post right third toe amputation that was performed inpatient on 11/04/2022 secondary to osteomyelitis of the toe.  Patient continues to do well however he continues to have edema to the foot.  CT w/ contrast scan performed on 03/01/2023 however final read from the radiologist not completed.  Presenting today for further treatment and evaluation  Past Medical History:  Diagnosis Date   A-fib (HCC)    Arthritis    Diabetes mellitus without complication (HCC)    Presence of permanent cardiac pacemaker    Sick sinus syndrome (HCC)    Urinary retention    chronic, with indwelling Foley catheter and plans for a suprapubic   Wears dentures    full upper    Past Surgical History:  Procedure Laterality Date   AMPUTATION TOE Right 11/04/2022   Procedure: AMPUTATION TOE;  Surgeon: Felecia Shelling, [name];  [name];  Service: Podiatry;  Laterality: Right;   CATARACT EXTRACTION W/PHACO Right 01/16/2017   Procedure: CATARACT EXTRACTION PHACO AND INTRAOCULAR LENS PLACEMENT (IOC)  Right Complicated;  Surgeon: Lockie Mola, MD;  Location: [facility];  Service: Ophthalmology;  Laterality: Right;  IVA Block Healon 5 malyugin vision blue Diabetic - oral meds   CATARACT EXTRACTION W/PHACO Left 10/23/2022   Procedure: CATARACT EXTRACTION PHACO AND INTRAOCULAR LENS PLACEMENT (IOC) LEFT DIABETIC;  Surgeon: Galen Manila, MD;  Location: [facility];  Service: Ophthalmology;  Laterality: Left;  Diabetic   INSERT / REPLACE / REMOVE PACEMAKER  09/15/2020   Medtronic [id number]   LOWER EXTREMITY ANGIOGRAPHY Right 11/02/2022   Procedure: Lower Extremity Angiography;  Surgeon: Annice Needy, MD;  Location: [facility] INVASIVE CV LAB;  Service: Cardiovascular;  Laterality: Right;   METATARSAL HEAD EXCISION Left 07/05/2018    Procedure: RESECTION FIRST METATARSAL INFECTED BONE AND SOFT TISSUE;  Surgeon: Recardo Evangelist, [name];  [name];  Service: Podiatry;  Laterality: Left;   TOE AMPUTATION Left    TONSILLECTOMY      No Known Allergies   Objective/Physical Exam Skin is warm to touch.  Incision nicely healed.  No erythema.  Chronic edema noted to the digits of the foot  Radiographic Exam RT foot 11/13/2022:  Absence of the right third toe noted at the level of the MTP.  There does appear to be some erosions also at the medial aspect of the base of the second proximal phalanx at the level of the MTP.  Will observe for now.  Degenerative changes also noted with joint space narrowing and periarticular spurring to the first MTP.  No acute fractures identified.  No gas within the tissue.  Radiographic exam RT foot 01/15/2023: New radiographic findings of erosions of the third metatarsal head as well as questionable erosion around the fourth MTP.  Concerning for residual osteomyelitis within the forefoot.  No acute fractures identified.  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             Right Lower Extremity:  This demonstrated fairly normal common femoral artery with small and diseased profunda femoris artery.  The SFA was fairly  normal proximally, but in the midsegment there was about a 90% stenosis in the SFA.  The vessel normalized down through Hunter's canal and in the popliteal artery just above the knee was another stenosis in the 70% range.  The anterior tibial artery was really the only runoff vessel Stilley and was continuous into the foot, but there were multiple areas of stenosis including about an 80 to 90% stenosis in the proximal segment, a 70 to 80% stenosis in the midsegment, and about a 70% stenosis in the  distal segment just above the ankle.  The posterior tibial and peroneal arteries did add collateral flow in the distal segments with reconstitution at the ankle but there was a long occlusion of the tibioperoneal trunk and the peroneal and posterior tibial arteries in the proximal and mid segments.   Assessment: 1. s/p right third toe amputation. DOS: 11/04/2022 performed inpatient 2.  Concern for residual osteomyelitis third metatarsal head and fourth MTP  -Patient evaluated.   -CT with contrast final read pending - Will plan to contact the patient after the CT scan results are available to discuss the results and further treatment options -No scheduled return appointment on file  Felecia Shelling, [name] [facility]  Dr. Felecia Shelling, [name]    [address] [place], [place] [address]                Office [phone]  Fax [phone]

## 2023-03-06 ENCOUNTER — Telehealth: Payer: Self-pay | Admitting: Podiatry

## 2023-03-06 NOTE — Telephone Encounter (Signed)
Spoke with patient today via telephone and reviewed the findings of the CT scan of his right foot concerning for residual osteomyelitis.  CT scan findings are consistent with residual osteomyelitis within the third metatarsal head and fourth metatarsal head.  I do believe the patient needs to return to the OR for revisional resection of the osteomyelitis and bone biopsy.  This was discussed in detail with the patient.  Risk benefits advantages and disadvantages were explained to the patient no guarantees were expressed or implied.  He is well aware and agrees would like to proceed with surgery.  Authorization for surgery will consist of third and fourth metatarsal head resections right foot.  Bone biopsy third and fourth metatarsals right foot.  Outpatient at [facility].  Message sent to our surgery scheduler to arrange.  I would like for the patient to refrain from antibiotics if possible leading up to surgery for better and more accurate bone culture and bone pathology results.  Return to clinic 1 week postop  Felecia Shelling, [name] [facility]  Dr. Felecia Shelling, [name]    [address] [place], [place] [address]                Office [phone]  Fax [phone]

## 2023-03-11 ENCOUNTER — Telehealth: Payer: Self-pay | Admitting: Podiatry

## 2023-03-11 ENCOUNTER — Other Ambulatory Visit: Payer: Self-pay | Admitting: Podiatry

## 2023-03-11 MED ORDER — AMOXICILLIN-POT CLAVULANATE 875-125 MG PO TABS: 1.0000 | ORAL_TABLET | Freq: Two times a day (BID) | ORAL | 0 refills | Status: DC

## 2023-03-11 NOTE — Progress Notes (Signed)
Discontinue antibiotics approximately 10 days prior to surgery for more accurate bone biopsy and culture.

## 2023-03-11 NOTE — H&P (View-Only) (Signed)
Discontinue antibiotics approximately 10 days prior to surgery for more accurate bone biopsy and culture.    

## 2023-03-11 NOTE — Telephone Encounter (Signed)
Patint came into the BTG office today stating he was suppose to come get a blue bag and he wanted to know if Dr Logan Bores could call in some penicillan or something for his foot.. Patient states his surgery isn't until July with Dr Logan Bores.

## 2023-03-12 NOTE — Telephone Encounter (Signed)
Called pt let him know RX was sent over to pharmacy.

## 2023-03-13 ENCOUNTER — Encounter: Payer: Self-pay | Admitting: Podiatry

## 2023-03-13 ENCOUNTER — Encounter: Payer: Self-pay | Admitting: Urology

## 2023-03-15 ENCOUNTER — Telehealth: Payer: Self-pay | Admitting: Urology

## 2023-03-15 NOTE — Telephone Encounter (Signed)
DOS - 04/05/23  OSTECT COMP MET HEAD 3,4 RIGHT --- 14782 BONE BIOPSY RIGHT --- 20240  [employer] EFFECTIVE DATE - 09/24/2018  DEDUCTIBLE - $2,000.00 W/ $0.00 REMAINING OOP - $6,000.00 W/ $0.00 REMAINING COINSURANCE - 20%  SPOKE WITH RAD WITH [employer] AND HE STATED THAT FOR CPT CODES 28112 AND 20240 NO PRIOR AUTH IS REQUIRED.  CALL REF # [id number] CALL REF # [id number]

## 2023-03-20 DIAGNOSIS — I10 Essential (primary) hypertension: Secondary | ICD-10-CM | POA: Diagnosis not present

## 2023-03-20 DIAGNOSIS — Z1330 Encounter for screening examination for mental health and behavioral disorders, unspecified: Secondary | ICD-10-CM | POA: Diagnosis not present

## 2023-03-20 DIAGNOSIS — I5032 Chronic diastolic (congestive) heart failure: Secondary | ICD-10-CM | POA: Diagnosis not present

## 2023-03-20 DIAGNOSIS — I340 Nonrheumatic mitral (valve) insufficiency: Secondary | ICD-10-CM | POA: Diagnosis not present

## 2023-03-20 DIAGNOSIS — R0609 Other forms of dyspnea: Secondary | ICD-10-CM | POA: Diagnosis not present

## 2023-03-27 ENCOUNTER — Other Ambulatory Visit: Payer: Self-pay

## 2023-03-27 ENCOUNTER — Encounter
Admission: RE | Admit: 2023-03-27 | Discharge: 2023-03-27 | Disposition: A | Discharge status: Home / Self Care | Payer: BC Managed Care – PPO | Source: Ambulatory Visit | Attending: Podiatry | Admitting: Podiatry

## 2023-03-27 DIAGNOSIS — N1831 Chronic kidney disease, stage 3a: Secondary | ICD-10-CM

## 2023-03-27 DIAGNOSIS — I10 Essential (primary) hypertension: Secondary | ICD-10-CM

## 2023-03-27 DIAGNOSIS — E119 Type 2 diabetes mellitus without complications: Secondary | ICD-10-CM

## 2023-03-27 HISTORY — DX: Peripheral vascular disease, unspecified: I73.9

## 2023-03-27 HISTORY — DX: Pneumonia, unspecified organism: J18.9

## 2023-03-27 HISTORY — DX: Essential (primary) hypertension: I10

## 2023-03-27 NOTE — Patient Instructions (Addendum)
Your procedure is scheduled on: 04/05/23 - Friday Report to the Registration Desk on the 1st floor of the [facility]. To find out your arrival time, please call [phone] between 1PM - 3PM on: 04/04/23 - Thursday If your arrival time is 6:00 am, do not arrive before that time as the [facility] entrance doors do not open until 6:00 am.  REMEMBER: Instructions that are not followed completely may result in serious medical risk, up to and including death; or upon the discretion of your surgeon and anesthesiologist your surgery may need to be rescheduled.  Do not eat food after midnight the night before surgery.  No gum chewing or hard candies.  You may drink water up to 2 hours before you are scheduled to arrive for your surgery. Do not drink anything within 2 hours of your scheduled arrival time.   One week prior to surgery: Stop Anti-inflammatories (NSAIDS) such as Advil, Aleve, Ibuprofen, Motrin, Naproxen, Naprosyn and Aspirin based products such as Excedrin, Goody's Powder, BC Powder. You may take Tylenol if needed for pain up until the day of surgery.  Stop ANY OVER THE COUNTER supplements until after surgery : Super Beets Powder    Continue taking all prescribed medications with the exception of the following:  metFORMIN (GLUCOPHAGE) stop taking on 04/03/23, resume after surgery. clopidogrel (PLAVIX) stop taking beginning 03/31/23, resume taking with doctors instructions.   TAKE ONLY THESE MEDICATIONS THE MORNING OF SURGERY WITH A SIP OF WATER:   carvedilol (COREG)   No Alcohol for 24 hours before or after surgery.  No Smoking including e-cigarettes for 24 hours before surgery.  No chewable tobacco products for at least 6 hours before surgery.  No nicotine patches on the day of surgery.  Do not use any "recreational" drugs for at least a week (preferably 2 weeks) before your surgery.  Please be advised that the combination of cocaine and anesthesia may have negative  outcomes, up to and including death. If you test positive for cocaine, your surgery will be cancelled.  On the morning of surgery brush your teeth with toothpaste and water, you may rinse your mouth with mouthwash if you wish. Do not swallow any toothpaste or mouthwash.  Use CHG Soap or wipes as directed on instruction sheet.  Do not wear jewelry, make-up, hairpins, clips or nail polish.  Do not wear lotions, powders, or perfumes.   Do not shave body hair from the neck down 48 hours before surgery.  Contact lenses, hearing aids and dentures may not be worn into surgery.  Do not bring valuables to the hospital. [facility] is not responsible for any missing/lost belongings or valuables.   Notify your doctor if there is any change in your medical condition (cold, fever, infection).  Wear comfortable clothing (specific to your surgery type) to the hospital.  After surgery, you can help prevent lung complications by doing breathing exercises.  Take deep breaths and cough every 1-2 hours. Your doctor may order a device called an Incentive Spirometer to help you take deep breaths. When coughing or sneezing, hold a pillow firmly against your incision with both hands. This is called "splinting." Doing this helps protect your incision. It also decreases belly discomfort.  If you are being admitted to the hospital overnight, leave your suitcase in the car. After surgery it may be brought to your room.  In case of increased patient census, it may be necessary for you, the patient, to continue your postoperative care in the  Same Day Surgery department.  If you are being discharged the day of surgery, you will not be allowed to drive home. You will need a responsible individual to drive you home and stay with you for 24 hours after surgery.   If you are taking public transportation, you will need to have a responsible individual with you.  Please call the Pre-admissions Testing Dept. at [phone] if you have any questions about these instructions.  Surgery Visitation Policy:  Patients having surgery or a procedure may have two visitors.  Children under the age of 57 must have an adult with them who is not the patient.  Inpatient Visitation:    Visiting hours are 7 a.m. to 8 p.m. Up to four visitors are allowed at one time in a patient room. The visitors may rotate out with other people during the day.  One visitor age 70 or older may stay with the patient overnight and must be in the room by 8 p.m.

## 2023-03-29 DIAGNOSIS — Z89412 Acquired absence of left great toe: Secondary | ICD-10-CM | POA: Diagnosis not present

## 2023-03-29 DIAGNOSIS — I739 Peripheral vascular disease, unspecified: Secondary | ICD-10-CM | POA: Diagnosis not present

## 2023-03-29 DIAGNOSIS — M86671 Other chronic osteomyelitis, right ankle and foot: Secondary | ICD-10-CM | POA: Diagnosis not present

## 2023-03-29 DIAGNOSIS — Z01818 Encounter for other preprocedural examination: Secondary | ICD-10-CM | POA: Diagnosis not present

## 2023-03-29 DIAGNOSIS — Z79899 Other long term (current) drug therapy: Secondary | ICD-10-CM | POA: Diagnosis not present

## 2023-03-29 DIAGNOSIS — E119 Type 2 diabetes mellitus without complications: Secondary | ICD-10-CM | POA: Diagnosis not present

## 2023-04-02 ENCOUNTER — Encounter
Admission: RE | Admit: 2023-04-02 | Discharge: 2023-04-02 | Disposition: A | Discharge status: Home / Self Care | Payer: BC Managed Care – PPO | Source: Ambulatory Visit | Attending: Podiatry | Admitting: Podiatry

## 2023-04-02 ENCOUNTER — Encounter: Payer: Self-pay | Admitting: Podiatry

## 2023-04-02 DIAGNOSIS — I495 Sick sinus syndrome: Secondary | ICD-10-CM | POA: Diagnosis not present

## 2023-04-02 DIAGNOSIS — E785 Hyperlipidemia, unspecified: Secondary | ICD-10-CM | POA: Diagnosis not present

## 2023-04-02 DIAGNOSIS — N401 Enlarged prostate with lower urinary tract symptoms: Secondary | ICD-10-CM | POA: Diagnosis not present

## 2023-04-02 DIAGNOSIS — I10 Essential (primary) hypertension: Secondary | ICD-10-CM

## 2023-04-02 DIAGNOSIS — I442 Atrioventricular block, complete: Secondary | ICD-10-CM | POA: Diagnosis not present

## 2023-04-02 DIAGNOSIS — E1169 Type 2 diabetes mellitus with other specified complication: Secondary | ICD-10-CM | POA: Diagnosis not present

## 2023-04-02 DIAGNOSIS — T45526A Underdosing of antithrombotic drugs, initial encounter: Secondary | ICD-10-CM | POA: Diagnosis not present

## 2023-04-02 DIAGNOSIS — Z01810 Encounter for preprocedural cardiovascular examination: Secondary | ICD-10-CM | POA: Diagnosis not present

## 2023-04-02 DIAGNOSIS — Z6828 Body mass index (BMI) 28.0-28.9, adult: Secondary | ICD-10-CM | POA: Diagnosis not present

## 2023-04-02 DIAGNOSIS — N1831 Chronic kidney disease, stage 3a: Secondary | ICD-10-CM

## 2023-04-02 DIAGNOSIS — E669 Obesity, unspecified: Secondary | ICD-10-CM | POA: Diagnosis not present

## 2023-04-02 DIAGNOSIS — N183 Chronic kidney disease, stage 3 unspecified: Secondary | ICD-10-CM | POA: Diagnosis not present

## 2023-04-02 DIAGNOSIS — I4891 Unspecified atrial fibrillation: Secondary | ICD-10-CM | POA: Diagnosis not present

## 2023-04-02 DIAGNOSIS — M86671 Other chronic osteomyelitis, right ankle and foot: Secondary | ICD-10-CM | POA: Diagnosis not present

## 2023-04-02 DIAGNOSIS — I272 Pulmonary hypertension, unspecified: Secondary | ICD-10-CM | POA: Diagnosis not present

## 2023-04-02 DIAGNOSIS — R338 Other retention of urine: Secondary | ICD-10-CM | POA: Diagnosis not present

## 2023-04-02 DIAGNOSIS — Z7982 Long term (current) use of aspirin: Secondary | ICD-10-CM | POA: Diagnosis not present

## 2023-04-02 DIAGNOSIS — I739 Peripheral vascular disease, unspecified: Secondary | ICD-10-CM | POA: Diagnosis not present

## 2023-04-02 DIAGNOSIS — I2510 Atherosclerotic heart disease of native coronary artery without angina pectoris: Secondary | ICD-10-CM | POA: Diagnosis not present

## 2023-04-02 DIAGNOSIS — E1122 Type 2 diabetes mellitus with diabetic chronic kidney disease: Secondary | ICD-10-CM | POA: Diagnosis not present

## 2023-04-02 DIAGNOSIS — I130 Hypertensive heart and chronic kidney disease with heart failure and stage 1 through stage 4 chronic kidney disease, or unspecified chronic kidney disease: Secondary | ICD-10-CM | POA: Diagnosis not present

## 2023-04-02 DIAGNOSIS — Z79899 Other long term (current) drug therapy: Secondary | ICD-10-CM | POA: Diagnosis not present

## 2023-04-02 DIAGNOSIS — Z950 Presence of cardiac pacemaker: Secondary | ICD-10-CM | POA: Diagnosis not present

## 2023-04-02 DIAGNOSIS — E1165 Type 2 diabetes mellitus with hyperglycemia: Secondary | ICD-10-CM | POA: Diagnosis not present

## 2023-04-02 DIAGNOSIS — I5032 Chronic diastolic (congestive) heart failure: Secondary | ICD-10-CM | POA: Diagnosis not present

## 2023-04-02 DIAGNOSIS — Z01818 Encounter for other preprocedural examination: Secondary | ICD-10-CM | POA: Insufficient documentation

## 2023-04-02 DIAGNOSIS — I493 Ventricular premature depolarization: Secondary | ICD-10-CM | POA: Insufficient documentation

## 2023-04-02 LAB — CBC
HCT: 38.1 % — ABNORMAL LOW (ref 39.0–52.0)
Hemoglobin: 13.2 g/dL (ref 13.0–17.0)
MCH: 31.7 pg (ref 26.0–34.0)
MCHC: 34.6 g/dL (ref 30.0–36.0)
MCV: 91.6 fL (ref 80.0–100.0)
Platelets: 179 10*3/uL (ref 150–400)
RBC: 4.16 MIL/uL — ABNORMAL LOW (ref 4.22–5.81)
RDW: 13.9 % (ref 11.5–15.5)
WBC: 9.2 10*3/uL (ref 4.0–10.5)
nRBC: 0 % (ref 0.0–0.2)

## 2023-04-02 LAB — BASIC METABOLIC PANEL
Anion gap: 11 (ref 5–15)
BUN: 42 mg/dL — ABNORMAL HIGH (ref 8–23)
CO2: 20 mmol/L — ABNORMAL LOW (ref 22–32)
Calcium: 9.2 mg/dL (ref 8.9–10.3)
Chloride: 105 mmol/L (ref 98–111)
Creatinine, Ser: 1.62 mg/dL — ABNORMAL HIGH (ref 0.61–1.24)
GFR, Estimated: 43 mL/min — ABNORMAL LOW (ref >60)
Glucose, Bld: 112 mg/dL — ABNORMAL HIGH (ref 70–99)
Potassium: 4.5 mmol/L (ref 3.5–5.1)
Sodium: 136 mmol/L (ref 135–145)

## 2023-04-02 NOTE — Progress Notes (Incomplete)
Perioperative / Anesthesia Services  Pre-Admission Testing Clinical Review / Preoperative Anesthesia Consult  Date: 04/04/23  Patient Demographics:  Name: Tyler [name] DOB:   [birth year] MRN:   [id number]  Planned Surgical Procedure(s):    Case: [id number] Date/Time: 04/05/23 1033   Procedures:      METATARSAL HEAD EXCISION THIRD & FOURTH (Right: Toe)     BONE BIOPSY THIRD & FOURTH (Right)   Anesthesia type: Choice   Pre-op diagnosis: osteomyelitis   Location: [facility] OR ROOM 03 / [facility]   Surgeons: Felecia Shelling, [name]     NOTE: Available PAT nursing documentation and vital signs have been reviewed. Clinical nursing staff has updated patient's PMH/PSHx, current medication list, and drug allergies/intolerances to ensure comprehensive history available to assist in medical decision making as it pertains to the aforementioned surgical procedure and anticipated anesthetic course. Extensive review of available clinical information personally performed. Tyler [name] PMH and PSHx updated with any diagnoses/procedures that  may have been inadvertently omitted during his intake with the pre-admission testing department's nursing staff.  Clinical Discussion:  Tyler [name] is a 77 y.o. male who is submitted for pre-surgical anesthesia review and clearance prior to him undergoing the above procedure. Patient has never been a smoker. Pertinent PMH includes: CAD, atrial fibrillation/flutter, HFpEF, cardiomegaly, drug induced bradycardia, SSS/CHB (s/p PPM placement), PVD, HTN, HLD, T2DM, CKD-III, DOE, pulmonary hypertension, hepatosplenomegaly, anemia, lymphedema, OA, RA, osteomyelitis, urinary retention.  Patient is followed by cardiology Chales Abrahams, MD). He was last seen in the cardiology clinic on 03/20/2023; notes reviewed. At the time of his clinic visit, patient doing well overall from a cardiovascular perspective.  Patient complained of mild exertional dyspnea that was stable and  at baseline.  Patient denied any chest pain, PND, orthopnea, palpitations, significant peripheral edema, weakness, fatigue, vertiginous symptoms, or presyncope/syncope. Patient with a past medical history significant for cardiovascular diagnoses. Documented physical exam was grossly benign, providing no evidence of acute exacerbation and/or decompensation of the patient's known cardiovascular conditions.  Myocardial perfusion imaging study was performed on 03/25/2020 revealing a normal left ventricular systolic function with an EF of 57%.  There was no evidence of stress-induced myocardial ischemia or arrhythmia; no scintigraphic evidence of scar.  Wall motion was normal.  Study determined to be normal and low risk.  Long-term cardiac event monitor study performed on 08/23/2020 revealing an underlying atrial fibrillation/flutter with rates ranging between 84 and 147 bpm.  Average heart rate was 120 bpm.  There were no episodes of bradycardia noted.  Patient remained tachycardic for 68.2% of the total study time.  Rare (2) PVCs noted.  4 patient triggered events were recorded corresponding with atrial flutter with RVR.  The longest R-R interval was 0.7 seconds.  There were no prolonged pauses.  Patient with a history of intermittent complete heart block (sick sinus syndrome) in the setting of urosepsis.  Transvenous pacemaker was placed on 09/10/2020.  Patient ultimately underwent placement of a permanent Medtronic Azure XT DR pacemaker on 09/15/2020.  Device is regularly interrogated by patient's primary electrophysiology team.  Last interrogation was on 02/25/2023, at which time patient's device was noted to be functioning properly.  Most recent TTE was performed on 09/26/2020 revealing a normal left ventricular systolic function with an EF of 55-60%.  There were no regional wall motion abnormalities.  Diastolic Doppler parameters were normal.  Right ventricular size and function were normal.  Right atrium  was mildly dilated with a volume index of 35-41  mL/m.  There was trivial to mild mitral, tricuspid, and pulmonary valve regurgitation.  Patient with known mild pulmonary hypertension; RVSP 37-49 mmHg.  All transvalvular gradients were noted to be normal providing no evidence suggestive of valvular stenosis.  Aorta normal in size with no evidence of aneurysmal dilatation.  Patient with an atrial fibrillation diagnosis; CHA2DS2-VASc Score = 5 (age x 2, HFpEF, HTN, T2DM). Patient underwent CTI ablation on 09/07/2020.  Cardiac rate and rhythm currently being maintained on oral carvedilol.  Patient is not on chronic anticoagulation therapy beyond a daily low-dose ASA.  Blood pressure reasonably controlled at 140/82 mmHg on currently prescribed beta-blocker (carvedilol), diuretic (furosemide + metolazone), and ACEi (lisinopril) therapies.  Patient is not on any type of lipid-lowering therapies for his HLD diagnosis and ASCVD prevention.  T2DM well-controlled on currently prescribed regimen; last HgbA1c was 6.5% when checked on 03/29/2023.  Functional capacity somewhat limited by patient's age and multiple medical comorbidities.  With that said, patient is able to complete all of his ADLs/IADLs independently without cardiovascular limitation.  Per the DASI, patient is able to complete at least 4 METS of physical activity without experiencing any significant degrees of angina/anginal equivalent symptoms.  No changes were made to his medication regimen.  Patient to follow-up with outpatient cardiology in 6 months or sooner if needed.  Tyler [name] is scheduled for METATARSAL HEAD EXCISION THIRD & FOURTH (Right: Toe); BONE BIOPSY THIRD & FOURTH (Right) on 04/05/2023 with Dr. Gala Lewandowsky, [name].  Given patient's past medical history significant for cardiovascular diagnoses, presurgical clearances from patient's primary care and cardiology providers was sought by the PAT team.  Specialty clearances were obtained as  follows:  Per internal/family medicine (McClanahan, NP-C), this patient is optimized for surgery and may proceed with the planned procedural course with an overall ACCEPTABLE risk of significant perioperative complications".  Per cardiology (Ruoff, PA-C), "patient may proceed with right foot surgery as planned. His probability of MI or cardiac arrest is 0.79% per Chales Abrahams perioperative cardiac risk index".  In review of his medication reconciliation, it is noted that patient is currently on prescribed daily antithrombotic therapy. Per notes, patient is supposed to be on DAPT therapy (ASA _ clopidogrel), however he reports that he has not taken clopidogrel in several months. Patient will continue his low dose ASA throughout his perioperative course.   Patient denies previous perioperative complications with anesthesia in the past. In review of the available records, it is noted that patient underwent a general anesthetic course here at [facility] ([facility] in 10/2022 without documented complications.      02/11/2023    2:22 PM 02/11/2023    2:19 PM 12/11/2022   11:28 AM  Vitals with BMI  Height 6\' 6"  6\' 6"    Weight 250 lbs 250 lbs   BMI 28.9 28.9   Systolic  166 167  Diastolic  80 94  Pulse  100 76    Providers/Specialists:   NOTE: Primary physician provider listed below. Patient may have been seen by APP or partner within same practice.   PROVIDER ROLE / SPECIALTY LAST Carolanne Grumbling, [name] Podiatry (Surgeon) 03/11/2023  Elder Negus, NP Primary Care Provider 03/29/2023  Dyane Dustman, MD Cardiology 03/20/2023   Allergies:  Patient has no known allergies.  Current Home Medications:   No current facility-administered medications for this encounter.    aspirin EC 81 MG tablet   carvedilol (COREG) 6.25 MG tablet   furosemide (LASIX) 40  MG tablet   glipiZIDE (GLUCOTROL) 10 MG tablet   lisinopril (ZESTRIL) 40 MG tablet   metFORMIN (GLUCOPHAGE)  1000 MG tablet   metolazone (ZAROXOLYN) 2.5 MG tablet   Multiple Vitamins-Minerals (MULTIVITAMIN WITH MINERALS) tablet   OVER THE COUNTER MEDICATION   History:   Past Medical History:  Diagnosis Date   (HFpEF) heart failure with preserved ejection fraction (HCC) 03/01/2020   a.) TTE 03/01/2020: EF 55-60%, mod MAC, mod AoV sclerosis, triv AR, mild TR, mod MR, RVSP 50-59; b.) TTE 09/10/2020: EF 55-60%, mod MAC, mod AoV sclerosis, mild TR, 3+ MR, RVSP 50-59; c.) TTE 09/26/2020: EF 55-60%, mild LA dil, triv PR, mild MR/TR, RVSP 37-49   Adenoma of left adrenal gland    Anemia    Arthritis    Atrial fibrillation and flutter (HCC)    a.) CHA2DS2VASc = 5 (age x2, HFpEF, HTN, T2DM);  b.) s/p CTI ablation 09/07/2020; c.) rate/rhythm maintained on oral carvedilol; not on chronic anticoagulation therapy   CAD (coronary artery disease)    Cardiomegaly    CKD (chronic kidney disease), stage III (HCC)    DOE (dyspnea on exertion)    Drug-induced bradycardia    Gangrene of toe of left foot (HCC)    a.) 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    HLD (hyperlipidemia)    Hypertension    Long term current use of aspirin    Lymphedema of both lower extremities    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)    a.) s/p amputation 11/04/2022   Peripheral vascular disease (HCC)    Pleural effusion on right 09/09/2020   a.) s/p RIGHT thoracentesis with 2180 cc yield   Pneumonia    Presence of permanent cardiac pacemaker 09/10/2020   a.) TVP placement 09/10/2020 due to intermittent CHB in setting of urosepsis; b.) s/p PPM placement 09/15/2020: MDT Azure XT DR (SN: [id number])   Pulmonary hypertension (HCC) 03/01/2020   a.) TTE: 03/01/2020: RVSP 50-59; b.) TTE 09/10/2020: RVSP 50-59; c.) TTE 09/26/2020: RVSP 37-49   RA (rheumatoid arthritis) (HCC)    Rheumatic fever    Sepsis (HCC) 09/10/2020   a.) urosepsis --> BC x 2 sets and UC  all grew out significant Proteus mirabilis; admitted to [facility] 09/07/2020 - 09/27/2020.   Sick sinus syndrome [facility])    a.) s/p MDT PPM placement 09/15/2020   T2DM (type 2 diabetes mellitus) (HCC)    Urinary retention    chronic, with indwelling Foley catheter and plans for a suprapubic   Wears dentures    full upper   Past Surgical History:  Procedure Laterality Date   AMPUTATION TOE Right 11/04/2022   Procedure: AMPUTATION TOE;  Surgeon: Felecia Shelling, [name];  [name];  Service: Podiatry;  Laterality: Right;   CARDIAC ELECTROPHYSIOLOGY STUDY AND ABLATION N/A 09/07/2020   Procedure: CARDIAC EP STUDY AND ABLATION (CTI)   CATARACT EXTRACTION W/PHACO Right 01/16/2017   Procedure: CATARACT EXTRACTION PHACO AND INTRAOCULAR LENS PLACEMENT (IOC)  Right Complicated;  Surgeon: Lockie Mola, MD;  Location: [facility];  Service: Ophthalmology;  Laterality: Right;  IVA Block Healon 5 malyugin vision blue Diabetic - oral meds   CATARACT EXTRACTION W/PHACO Left 10/23/2022   Procedure: CATARACT EXTRACTION PHACO AND INTRAOCULAR LENS PLACEMENT (IOC) LEFT DIABETIC;  Surgeon: Galen Manila, MD;  Location: [facility];  Service: Ophthalmology;  Laterality: Left;  Diabetic   COLONOSCOPY  LOWER EXTREMITY ANGIOGRAPHY Right 11/02/2022   Procedure: Lower Extremity Angiography;  Surgeon: Annice Needy, MD;  Location: [facility] INVASIVE CV LAB;  Service: Cardiovascular;  Laterality: Right;   METATARSAL HEAD EXCISION Left 07/05/2018   Procedure: RESECTION FIRST METATARSAL INFECTED BONE AND SOFT TISSUE;  Surgeon: Recardo Evangelist, [name];  [name];  Service: Podiatry;  Laterality: Left;   PACEMAKER INSERTION  09/15/2020   TOE AMPUTATION Left    TONSILLECTOMY     No family history on file. Social History   Tobacco Use   Smoking status: Never   Smokeless tobacco: Never  Vaping Use   Vaping status: Never Used  Substance Use Topics   Alcohol use: Not  Currently    Comment: rarely   Drug use: Never    Pertinent Clinical Results:  LABS:   Lab Results  Component Value Date   WBC 9.2 04/02/2023   HGB 13.2 04/02/2023   HCT 38.1 (L) 04/02/2023   MCV 91.6 04/02/2023   PLT 179 04/02/2023   Lab Results  Component Value Date   NA 136 04/02/2023   K 4.5 04/02/2023   CO2 20 (L) 04/02/2023   GLUCOSE 112 (H) 04/02/2023   BUN 42 (H) 04/02/2023   CREATININE 1.62 (H) 04/02/2023   CALCIUM 9.2 04/02/2023   GFRNONAA 43 (L) 04/02/2023    ECG: Date: 04/04/2023 Time ECG obtained: 1445 PM Rate: 86 bpm Rhythm:  Atrial sense ventricular paced rhythm Axis (leads I and aVF): Normal Intervals: PR 204 ms. QRS 214 ms. QTc 562 ms. ST segment and T wave changes: No evidence of acute ST segment elevation or depression Comparison: Similar to previous tracing obtained on 09/20/2022   IMAGING / PROCEDURES: CT FOOT RIGHT W CONTRAST performed on 03/01/2023 Postsurgical changes from third toe amputation at the MTP joint level. Erosive changes along the plantar aspect of the third metatarsal head and neck with partially sclerotic margins. Appearance favors acute or subacute on chronic osteomyelitis. Subchondral fracture of the fourth metatarsal head, age indeterminate. Severe osteoarthritis of the first MTP joint and hallux sesamoid complex.  DG CHEST PORT 1 VIEW performed on 11/05/2022 Gross cardiomegaly.  Left chest multi lead pacer.  Diffuse bilateral interstitial pulmonary opacity.  The visualized skeletal structures are unremarkable.  US ARTERIAL ABI (SCREENING LOWER EXTREMITY) performed on 10/31/2022 Normal resting right ankle-brachial index with distal waveform abnormalities suggestive of some degree of tibial disease. 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  ECHO LTD W/DOP AND COLOR FLOW W/O ENHANCING AGENT performed on 09/26/2020 The left ventricular  systolic function was normal with an EF of 55-60%.  Wall motion is normal.  Doppler parameters indicate normal diastolic function.  Right ventricle is normal. Systolic function is normal.   Left atrium is mildly dilated. Left atrium volume index is mildly increased (35-41 mL/m2).  Mitral valve structure is normal. There is mild regurgitation.  Tricuspid valve structure is normal. There is mild regurgitation.  The right ventricular systolic pressure is mildly elevated (37-49 mmHg).  The pulmonic valve was not well visualized. Trace regurgitation.   LONG TERM CARDIAC EVENT MONITOR STUDY performed on 08/23/2020 Patient remained in Aflutter and AFib with minimum HR 84 at 2:59 AM on day 7, max HR 147 at 8:06 AM on day 3.   Sometimes atrial flutter was questionable and it looks like normal sinus rhythm-by strips it was difficult to discriminate between atrial flutter with normal ventricular response and RVR and  sinus rhythm and sinus tachycardia.   Patient has known history of persistent A. fib/flutter.  Average HR 120.   Impression and Plan:  Tyler [name] has been referred for pre-anesthesia review and clearance prior to him undergoing the planned anesthetic and procedural courses. Available labs, pertinent testing, and imaging results were personally reviewed by me in preparation for upcoming operative/procedural course. [facility] medical record has been updated following extensive record review and patient interview with PAT staff.   This patient has been appropriately cleared by cardiology (ACCEPTABLE) and by internal/family medicine (ACCEPTABLE) with the individually indicated risks of patient experiencing significant perioperative complications. Completed perioperative prescription for cardiac device management documentation completed by primary cardiology team and placed on patient's chart for review by the surgical/anesthetic team on the day of his procedure. Electrophysiology indicating  that procedure should not interfere with planned surgical procedure. Beyond normal perioperative cardiovascular monitoring, there are no recommendations from electrophysiology team that prompt further discussion/recommendations from industry representative.   Based on clinical review performed today (04/04/23), barring any significant acute changes in the patient's overall condition, it is anticipated that he will be able to proceed with the planned surgical intervention. Any acute changes in clinical condition may necessitate his procedure being postponed and/or cancelled. Patient will meet with anesthesia team (MD and/or CRNA) on the day of his procedure for preoperative evaluation/assessment. Questions regarding anesthetic course will be fielded at that time.   Pre-surgical instructions were reviewed with the patient during his PAT appointment, and questions were fielded to satisfaction by PAT clinical staff. He has been instructed on which medications that he will need to hold prior to surgery, as well as the ones that have been deemed safe/appropriate to take on the day of his procedure. As part of the general education provided by PAT, patient made aware both verbally and in writing, that he would need to abstain from the use of any illegal substances during his perioperative course.  He was advised that failure to follow the provided instructions could necessitate case cancellation or result in serious perioperative complications up to and including death. Patient encouraged to contact PAT and/or his surgeon's office to discuss any questions or concerns that may arise prior to surgery; verbalized understanding.   Quentin Mulling, MSN, APRN, FNP-C, CEN [facility]  Peri-operative Services Nurse Practitioner Phone: [phone] Fax: [phone] 04/04/23 1:27 PM  NOTE: This note has been prepared using Dragon dictation software. Despite my best ability to proofread, there is always  the potential that unintentional transcriptional errors may still occur from this process.

## 2023-04-04 ENCOUNTER — Encounter: Payer: Self-pay | Admitting: Podiatry

## 2023-04-05 ENCOUNTER — Encounter
Admission: RE | Disposition: A | Discharge status: Home / Self Care | Payer: Self-pay | Source: Home / Self Care | Attending: Podiatry

## 2023-04-05 ENCOUNTER — Ambulatory Visit: Payer: BC Managed Care – PPO | Admitting: Urgent Care

## 2023-04-05 ENCOUNTER — Other Ambulatory Visit: Payer: Self-pay

## 2023-04-05 ENCOUNTER — Ambulatory Visit: Payer: BC Managed Care – PPO

## 2023-04-05 ENCOUNTER — Ambulatory Visit
Admission: RE | Admit: 2023-04-05 | Discharge: 2023-04-05 | Disposition: A | Discharge status: Home / Self Care | Payer: BC Managed Care – PPO | Source: Home / Self Care | Attending: Podiatry | Admitting: Podiatry

## 2023-04-05 ENCOUNTER — Encounter: Payer: Self-pay | Admitting: Podiatry

## 2023-04-05 DIAGNOSIS — M86671 Other chronic osteomyelitis, right ankle and foot: Secondary | ICD-10-CM | POA: Diagnosis not present

## 2023-04-05 DIAGNOSIS — I442 Atrioventricular block, complete: Secondary | ICD-10-CM | POA: Insufficient documentation

## 2023-04-05 DIAGNOSIS — E785 Hyperlipidemia, unspecified: Secondary | ICD-10-CM | POA: Insufficient documentation

## 2023-04-05 DIAGNOSIS — N401 Enlarged prostate with lower urinary tract symptoms: Secondary | ICD-10-CM | POA: Insufficient documentation

## 2023-04-05 DIAGNOSIS — I130 Hypertensive heart and chronic kidney disease with heart failure and stage 1 through stage 4 chronic kidney disease, or unspecified chronic kidney disease: Secondary | ICD-10-CM | POA: Diagnosis not present

## 2023-04-05 DIAGNOSIS — N183 Chronic kidney disease, stage 3 unspecified: Secondary | ICD-10-CM | POA: Insufficient documentation

## 2023-04-05 DIAGNOSIS — E1122 Type 2 diabetes mellitus with diabetic chronic kidney disease: Secondary | ICD-10-CM | POA: Insufficient documentation

## 2023-04-05 DIAGNOSIS — E1169 Type 2 diabetes mellitus with other specified complication: Secondary | ICD-10-CM | POA: Diagnosis not present

## 2023-04-05 DIAGNOSIS — R338 Other retention of urine: Secondary | ICD-10-CM | POA: Insufficient documentation

## 2023-04-05 DIAGNOSIS — E1151 Type 2 diabetes mellitus with diabetic peripheral angiopathy without gangrene: Secondary | ICD-10-CM | POA: Diagnosis not present

## 2023-04-05 DIAGNOSIS — I2510 Atherosclerotic heart disease of native coronary artery without angina pectoris: Secondary | ICD-10-CM | POA: Insufficient documentation

## 2023-04-05 DIAGNOSIS — I495 Sick sinus syndrome: Secondary | ICD-10-CM | POA: Diagnosis not present

## 2023-04-05 DIAGNOSIS — I739 Peripheral vascular disease, unspecified: Secondary | ICD-10-CM | POA: Insufficient documentation

## 2023-04-05 DIAGNOSIS — I132 Hypertensive heart and chronic kidney disease with heart failure and with stage 5 chronic kidney disease, or end stage renal disease: Secondary | ICD-10-CM | POA: Diagnosis not present

## 2023-04-05 DIAGNOSIS — M869 Osteomyelitis, unspecified: Secondary | ICD-10-CM | POA: Diagnosis not present

## 2023-04-05 DIAGNOSIS — I5032 Chronic diastolic (congestive) heart failure: Secondary | ICD-10-CM | POA: Diagnosis not present

## 2023-04-05 DIAGNOSIS — E1165 Type 2 diabetes mellitus with hyperglycemia: Secondary | ICD-10-CM | POA: Insufficient documentation

## 2023-04-05 DIAGNOSIS — I272 Pulmonary hypertension, unspecified: Secondary | ICD-10-CM | POA: Insufficient documentation

## 2023-04-05 DIAGNOSIS — I7090 Unspecified atherosclerosis: Secondary | ICD-10-CM | POA: Diagnosis not present

## 2023-04-05 DIAGNOSIS — M7989 Other specified soft tissue disorders: Secondary | ICD-10-CM | POA: Diagnosis not present

## 2023-04-05 DIAGNOSIS — E669 Obesity, unspecified: Secondary | ICD-10-CM | POA: Insufficient documentation

## 2023-04-05 DIAGNOSIS — E119 Type 2 diabetes mellitus without complications: Secondary | ICD-10-CM

## 2023-04-05 DIAGNOSIS — T45526A Underdosing of antithrombotic drugs, initial encounter: Secondary | ICD-10-CM | POA: Insufficient documentation

## 2023-04-05 DIAGNOSIS — N1831 Chronic kidney disease, stage 3a: Secondary | ICD-10-CM

## 2023-04-05 DIAGNOSIS — I4891 Unspecified atrial fibrillation: Secondary | ICD-10-CM | POA: Insufficient documentation

## 2023-04-05 DIAGNOSIS — Z6828 Body mass index (BMI) 28.0-28.9, adult: Secondary | ICD-10-CM | POA: Insufficient documentation

## 2023-04-05 DIAGNOSIS — Z7982 Long term (current) use of aspirin: Secondary | ICD-10-CM | POA: Insufficient documentation

## 2023-04-05 DIAGNOSIS — Z950 Presence of cardiac pacemaker: Secondary | ICD-10-CM | POA: Insufficient documentation

## 2023-04-05 DIAGNOSIS — M19071 Primary osteoarthritis, right ankle and foot: Secondary | ICD-10-CM | POA: Diagnosis not present

## 2023-04-05 DIAGNOSIS — Z79899 Other long term (current) drug therapy: Secondary | ICD-10-CM | POA: Insufficient documentation

## 2023-04-05 HISTORY — DX: Other forms of dyspnea: R06.09

## 2023-04-05 HISTORY — DX: Cataract extraction status, right eye: Z98.42

## 2023-04-05 HISTORY — DX: Atherosclerotic heart disease of native coronary artery without angina pectoris: I25.10

## 2023-04-05 HISTORY — DX: Cataract extraction status, right eye: Z98.41

## 2023-04-05 HISTORY — DX: Type 2 diabetes mellitus without complications: E11.9

## 2023-04-05 HISTORY — DX: Osteomyelitis, unspecified: M86.9

## 2023-04-05 HISTORY — DX: Chronic kidney disease, stage 3 unspecified: N18.30

## 2023-04-05 HISTORY — PX: BONE BIOPSY: SHX375

## 2023-04-05 HISTORY — DX: Hyperlipidemia, unspecified: E78.5

## 2023-04-05 HISTORY — DX: Anemia, unspecified: D64.9

## 2023-04-05 HISTORY — DX: Cardiomegaly: I51.7

## 2023-04-05 HISTORY — PX: METATARSAL HEAD EXCISION: SHX5027

## 2023-04-05 HISTORY — DX: Gangrene, not elsewhere classified: I96

## 2023-04-05 HISTORY — DX: Lymphedema, not elsewhere classified: I89.0

## 2023-04-05 HISTORY — DX: Long term (current) use of aspirin: Z79.82

## 2023-04-05 HISTORY — DX: Rheumatoid arthritis, unspecified: M06.9

## 2023-04-05 HISTORY — DX: Hepatomegaly with splenomegaly, not elsewhere classified: R16.2

## 2023-04-05 HISTORY — DX: Rheumatic fever without heart involvement: I00

## 2023-04-05 HISTORY — DX: Bradycardia, unspecified: R00.1

## 2023-04-05 HISTORY — DX: Unspecified atrial fibrillation: I48.91

## 2023-04-05 HISTORY — DX: Benign neoplasm of left adrenal gland: D35.02

## 2023-04-05 LAB — POCT I-STAT, CHEM 8
BUN: 31 mg/dL — ABNORMAL HIGH (ref 8–23)
Calcium, Ion: 1.25 mmol/L (ref 1.15–1.40)
Chloride: 108 mmol/L (ref 98–111)
Creatinine, Ser: 1.4 mg/dL — ABNORMAL HIGH (ref 0.61–1.24)
Glucose, Bld: 176 mg/dL — ABNORMAL HIGH (ref 70–99)
HCT: 38 % — ABNORMAL LOW (ref 39.0–52.0)
Hemoglobin: 12.9 g/dL — ABNORMAL LOW (ref 13.0–17.0)
Potassium: 4.3 mmol/L (ref 3.5–5.1)
Sodium: 139 mmol/L (ref 135–145)
TCO2: 23 mmol/L (ref 22–32)

## 2023-04-05 LAB — GLUCOSE, CAPILLARY
Glucose-Capillary: 120 mg/dL — ABNORMAL HIGH (ref 70–99)
Glucose-Capillary: 102 mg/dL — ABNORMAL HIGH (ref 70–99)

## 2023-04-05 SURGERY — EXCISION, METATARSAL BONE, HEAD
Anesthesia: General | Site: Toe | Laterality: Right

## 2023-04-05 MED ORDER — LACTATED RINGERS IV SOLN: INTRAVENOUS | Status: DC

## 2023-04-05 MED ORDER — PHENYLEPHRINE HCL-NACL 20-0.9 MG/250ML-% IV SOLN
INTRAVENOUS | Status: AC
  Filled 2023-04-05: qty 250

## 2023-04-05 MED ORDER — FAMOTIDINE 20 MG PO TABS
20.0000 mg | ORAL_TABLET | Freq: Once | ORAL | Status: AC
  Administered 2023-04-05: 20 mg via ORAL

## 2023-04-05 MED ORDER — PHENYLEPHRINE HCL (PRESSORS) 10 MG/ML IV SOLN
INTRAVENOUS | Status: DC | PRN
  Administered 2023-04-05 (×2): 80 ug via INTRAVENOUS
  Administered 2023-04-05: 160 ug via INTRAVENOUS
  Administered 2023-04-05: 80 ug via INTRAVENOUS

## 2023-04-05 MED ORDER — BUPIVACAINE HCL (PF) 0.5 % IJ SOLN
INTRAMUSCULAR | Status: AC
  Filled 2023-04-05: qty 30

## 2023-04-05 MED ORDER — ORAL CARE MOUTH RINSE: 15.0000 mL | Freq: Once | OROMUCOSAL | Status: AC

## 2023-04-05 MED ORDER — LIDOCAINE HCL (PF) 1 % IJ SOLN
INTRAMUSCULAR | Status: AC
  Filled 2023-04-05: qty 30

## 2023-04-05 MED ORDER — ONDANSETRON HCL 4 MG/2ML IJ SOLN: 4.0000 mg | Freq: Once | INTRAMUSCULAR | Status: DC | PRN

## 2023-04-05 MED ORDER — FENTANYL CITRATE (PF) 100 MCG/2ML IJ SOLN
INTRAMUSCULAR | Status: AC
  Filled 2023-04-05: qty 2

## 2023-04-05 MED ORDER — LIDOCAINE HCL 1 % IJ SOLN
INTRAMUSCULAR | Status: DC | PRN
  Administered 2023-04-05: 10 mL via SURGICAL_CAVITY

## 2023-04-05 MED ORDER — MIDAZOLAM HCL 5 MG/5ML IJ SOLN
INTRAMUSCULAR | Status: DC | PRN
  Administered 2023-04-05: 2 mg via INTRAVENOUS

## 2023-04-05 MED ORDER — PROPOFOL 1000 MG/100ML IV EMUL
INTRAVENOUS | Status: AC
  Filled 2023-04-05: qty 100

## 2023-04-05 MED ORDER — PROPOFOL 500 MG/50ML IV EMUL
INTRAVENOUS | Status: DC | PRN
  Administered 2023-04-05: 50 ug/kg/min via INTRAVENOUS

## 2023-04-05 MED ORDER — FENTANYL CITRATE (PF) 100 MCG/2ML IJ SOLN: 25.0000 ug | INTRAMUSCULAR | Status: DC | PRN

## 2023-04-05 MED ORDER — LIDOCAINE HCL (PF) 2 % IJ SOLN
INTRAMUSCULAR | Status: AC
  Filled 2023-04-05: qty 5

## 2023-04-05 MED ORDER — CEFAZOLIN SODIUM 1 G IJ SOLR
INTRAMUSCULAR | Status: AC
  Filled 2023-04-05: qty 20

## 2023-04-05 MED ORDER — CHLORHEXIDINE GLUCONATE 0.12 % MT SOLN
OROMUCOSAL | Status: AC
  Filled 2023-04-05: qty 15

## 2023-04-05 MED ORDER — CEFAZOLIN SODIUM-DEXTROSE 2-3 GM-%(50ML) IV SOLR
INTRAVENOUS | Status: DC | PRN
  Administered 2023-04-05: 2 g via INTRAVENOUS

## 2023-04-05 MED ORDER — OXYCODONE HCL 5 MG/5ML PO SOLN: 5.0000 mg | Freq: Once | ORAL | Status: DC | PRN

## 2023-04-05 MED ORDER — SODIUM CHLORIDE 0.9 % IV SOLN: INTRAVENOUS | Status: DC

## 2023-04-05 MED ORDER — ACETAMINOPHEN 10 MG/ML IV SOLN: 1000.0000 mg | Freq: Once | INTRAVENOUS | Status: DC | PRN

## 2023-04-05 MED ORDER — LIDOCAINE HCL (CARDIAC) PF 100 MG/5ML IV SOSY
PREFILLED_SYRINGE | INTRAVENOUS | Status: DC | PRN
  Administered 2023-04-05: 40 mg via INTRAVENOUS

## 2023-04-05 MED ORDER — OXYCODONE HCL 5 MG PO TABS: 5.0000 mg | ORAL_TABLET | Freq: Once | ORAL | Status: DC | PRN

## 2023-04-05 MED ORDER — MIDAZOLAM HCL 2 MG/2ML IJ SOLN
INTRAMUSCULAR | Status: AC
  Filled 2023-04-05: qty 2

## 2023-04-05 MED ORDER — PHENYLEPHRINE 80 MCG/ML (10ML) SYRINGE FOR IV PUSH (FOR BLOOD PRESSURE SUPPORT)
PREFILLED_SYRINGE | INTRAVENOUS | Status: AC
  Filled 2023-04-05: qty 10

## 2023-04-05 MED ORDER — CIPROFLOXACIN HCL 500 MG PO TABS: 500.0000 mg | ORAL_TABLET | Freq: Two times a day (BID) | ORAL | 0 refills | Status: DC

## 2023-04-05 MED ORDER — FAMOTIDINE 20 MG PO TABS
ORAL_TABLET | ORAL | Status: AC
  Filled 2023-04-05: qty 1

## 2023-04-05 MED ORDER — CHLORHEXIDINE GLUCONATE 0.12 % MT SOLN
15.0000 mL | Freq: Once | OROMUCOSAL | Status: AC
  Administered 2023-04-05: 15 mL via OROMUCOSAL

## 2023-04-05 SURGICAL SUPPLY — 35 items
BASIN KIT SINGLE STR (MISCELLANEOUS) ×2 IMPLANT
BLADE SURG 15 STRL LF DISP TIS (BLADE) ×2 IMPLANT
BLADE SURG 15 STRL SS (BLADE) ×2
BNDG CMPR 5X4 CHSV STRCH STRL (GAUZE/BANDAGES/DRESSINGS) ×2
BNDG CMPR 75X21 PLY HI ABS (MISCELLANEOUS) ×2
BNDG COHESIVE 4X5 TAN STRL LF (GAUZE/BANDAGES/DRESSINGS) ×2 IMPLANT
CNTNR URN SCR LID CUP LEK RST (MISCELLANEOUS) IMPLANT
CONT SPEC 4OZ STRL OR WHT (MISCELLANEOUS)
COVER MAYO STAND STRL (DRAPES) ×2 IMPLANT
DURAPREP 26ML APPLICATOR (WOUND CARE) ×2 IMPLANT
ELECT REM PT RETURN 9FT ADLT (ELECTROSURGICAL)
ELECTRODE REM PT RTRN 9FT ADLT (ELECTROSURGICAL) IMPLANT
GAUZE 4X4 16PLY ~~LOC~~+RFID DBL (SPONGE) ×2 IMPLANT
GAUZE SPONGE 4X4 12PLY STRL (GAUZE/BANDAGES/DRESSINGS) ×2 IMPLANT
GAUZE STRETCH 2X75IN STRL (MISCELLANEOUS) ×2 IMPLANT
GAUZE XEROFORM 1X8 LF (GAUZE/BANDAGES/DRESSINGS) ×2 IMPLANT
GLOVE BIO SURGEON STRL SZ7.5 (GLOVE) ×2 IMPLANT
GLOVE ORTHO TXT STRL SZ7.5 (GLOVE) ×2 IMPLANT
GOWN STRL REUS W/ TWL LRG LVL3 (GOWN DISPOSABLE) ×2 IMPLANT
GOWN STRL REUS W/TWL LRG LVL3 (GOWN DISPOSABLE) ×2
KIT TURNOVER CYSTO (KITS) ×2 IMPLANT
MANIFOLD NEPTUNE II (INSTRUMENTS) IMPLANT
NDL BIOPSY JAMSHIDI 11X6 (NEEDLE) ×2 IMPLANT
NDL HYPO 22X1.5 SAFETY MO (MISCELLANEOUS) IMPLANT
NEEDLE BIOPSY JAMSHIDI 11X6 (NEEDLE) ×2 IMPLANT
NEEDLE HYPO 22X1.5 SAFETY MO (MISCELLANEOUS) IMPLANT
PACK EXTREMITY (MISCELLANEOUS) ×2 IMPLANT
PAD PREP OB/GYN DISP 24X41 (PERSONAL CARE ITEMS) ×2 IMPLANT
PENCIL SMOKE EVACUATOR (MISCELLANEOUS) IMPLANT
STOCKINETTE 48X4 2 PLY STRL (GAUZE/BANDAGES/DRESSINGS) IMPLANT
STOCKINETTE STRL 4IN 9604848 (GAUZE/BANDAGES/DRESSINGS) IMPLANT
SUT ETHILON NAB PS2 4-0 18IN (SUTURE) IMPLANT
SWAB CULTURE ESWAB REG 1ML (MISCELLANEOUS) IMPLANT
SYR CONTROL 10ML LL (SYRINGE) ×4 IMPLANT
TUBING CONNECTOR 18X5MM (MISCELLANEOUS) IMPLANT

## 2023-04-05 NOTE — Op Note (Signed)
OPERATIVE REPORT Patient name: Tyler [name] MRN: [id number] DOB: 09-Oct-1945  DOS: 04/05/23  Preop Dx: Osteomyelitis third metatarsal head as well as suspicion for possible osteomyelitis to the fourth metatarsal head Postop Dx: same  Procedure:  1.  Third metatarsal head resection right 2.  Fourth metatarsal head resection right 3.  Bone biopsy third and fourth metatarsals right  Surgeon: Felecia Shelling [name]  Anesthesia: 50-50 mixture of 2% lidocaine plain with 0.5% Marcaine plain totaling 20 mL infiltrated around the patient's midfoot in a local block fashion   Hemostasis: Calf tourniquet inflated to a pressure of without esmarch exsanguination   EBL: 20 mL Materials: None Injectables: None Pathology: Third and fourth metatarsal heads each sent for BOTH path and culture.  Soft tissue culture right forefoot  Condition: The patient tolerated the procedure and anesthesia well. No complications noted or reported   Justification for procedure: The patient is a 77 y.o. male who presents today for surgical correction of osteomyelitis of the third metatarsal head with some questionable irregularity throughout the fourth metatarsal head based on CT. All conservative modalities of been unsuccessful in providing any sort of satisfactory alleviation of symptoms with the patient. The patient was told benefits as well as possible side effects of the surgery. The patient consented for surgical correction. The patient consent form was reviewed. All patient questions were answered. No guarantees were expressed or implied. The patient and the surgeon both signed the patient consent form with the witness present and placed in the patient's chart.   Procedure in Detail: The patient was brought to the operating room, placed in the operating table in the supine position at which time an aseptic scrub and drape were performed about the patient's respective lower extremity after anesthesia was  induced as described above. Attention was then directed to the surgical area where procedure number one commenced.  Procedure #1: Third metatarsal head resection right foot Attention was directed to the right forefoot where a 5 cm linear longitudinal skin incision was planned and made between the third and fourth metatarsals of the right foot.  The incision was carried down to the level of bone with care taken to cut clamp ligate and retract away all small neurovascular structures traversing the incision site.  The distal portion of the fourth metatarsal was identified and all soft tissue around the area was reflected away using a #15 scalpel.  Osteotomy was created at the neck of the fourth metatarsal and the head of the fourth metatarsal was removed in toto and placed in a sterile specimen container and sent to pathology.  Irrigation was utilized.  Attention was directed then within the incision site to the third metatarsal where procedure #2 commenced  Procedure #2: Fourth metatarsal head resection right foot Using a fresh #15 scalpel, an additional soft tissue dissection was performed to expose the distal portion of the third metatarsal of the right foot.  Another osteotomy was created at the more proximal portion of the neck of the third metatarsal using a fresh sagittal blade mounted a sagittal saw.  The distal portion of the third metatarsal was removed in toto and placed in sterile specimen container and sent to pathology.  Additional irrigation was performed in preparation for bone culture  Procedure #3: Third and fourth metatarsal bone biopsies right foot Fresh sagittal blades were utilized to create an additional slice of bone to the remaining portion of the distal metatarsals approximately 2 mm in thickness.  The slices of  bone from the third and fourth metatarsals were placed in sterile specimen container and sent to pathology for culture.  At this time deep soft tissue culture was also  performed and sent to pathology for aerobic anaerobic and Gram stain.  Additional irrigation was performed in preparation for primary closure.  Primary closure was achieved using 4-0 nylon suture.  Dry sterile compressive dressings were then applied to all previously mentioned incision sites about the patient's lower extremity. The tourniquet which was used for hemostasis was deflated. All normal neurovascular responses including pink color and warmth returned all the digits of patient's lower extremity.  The patient was then transferred from the operating room to the recovery room having tolerated the procedure and anesthesia well. All vital signs are stable. After a brief stay in the recovery room the patient was discharged with postoperative orders placed   Felecia Shelling, [name] [facility]  Dr. Felecia Shelling, [name]    [address] [place], [place] [address]                Office [phone]  Fax [phone]

## 2023-04-05 NOTE — Interval H&P Note (Signed)
History and Physical Interval Note:  04/05/2023 2:01 PM  Tyler [name]  has presented today for surgery, with the diagnosis of osteomyelitis.  The various methods of treatment have been discussed with the patient and family. After consideration of risks, benefits and other options for treatment, the patient has consented to  Procedure(s): METATARSAL HEAD EXCISION THIRD & FOURTH (Right) BONE BIOPSY THIRD & FOURTH (Right) as a surgical intervention.  [name] history has been reviewed, patient examined, no change in status, stable for surgery.  I have reviewed the patient's chart and labs.  Questions were answered to the patient's satisfaction.     Felecia Shelling

## 2023-04-05 NOTE — Interval H&P Note (Signed)
History and Physical Interval Note:  04/05/2023 2:02 PM  Tyler [name]  has presented today for surgery, with the diagnosis of osteomyelitis.  The various methods of treatment have been discussed with the patient and family. After consideration of risks, benefits and other options for treatment, the patient has consented to  Procedure(s): METATARSAL HEAD EXCISION THIRD & FOURTH (Right) BONE BIOPSY THIRD & FOURTH (Right) as a surgical intervention.  [name] history has been reviewed, patient examined, no change in status, stable for surgery.  I have reviewed the patient's chart and labs.  Questions were answered to the patient's satisfaction.     Felecia Shelling

## 2023-04-05 NOTE — Transfer of Care (Signed)
Immediate Anesthesia Transfer of Care Note  Patient: Tyler [name]  Procedure(s) Performed: METATARSAL HEAD EXCISION THIRD & FOURTH (Right: Toe) BONE BIOPSY THIRD & FOURTH (Right)  Patient Location: PACU  Anesthesia Type:General  Level of Consciousness: awake, alert , and oriented  Airway & Oxygen Therapy: Patient Spontanous Breathing  Post-op Assessment: Report given to RN and Post -op Vital signs reviewed and stable  Post vital signs: Reviewed and stable  Last Vitals:  Vitals Value Taken Time  BP 131/66 04/05/23 1615  Temp 35.7 1615  Pulse 73 04/05/23 1617  Resp 16 04/05/23 1617  SpO2 98 % 04/05/23 1617  Vitals shown include unfiled device data.  Last Pain:  Vitals:   04/05/23 0836  TempSrc: Temporal  PainSc: 0-No pain         Complications: No notable events documented.

## 2023-04-05 NOTE — Discharge Instructions (Addendum)
Minimal weightbearing as tolerated in a postsurgical shoe.  Elevate foot is much as possible over the following week.  Keep dressings clean and dry until follow-up in the office next week.   AMBULATORY SURGERY  DISCHARGE INSTRUCTIONS   The drugs that you were given will stay in your system until tomorrow so for the next 24 hours you should not:  Drive an automobile Make any legal decisions Drink any alcoholic beverage   You may resume regular meals tomorrow.  Today it is better to start with liquids and gradually work up to solid foods.  You may eat anything you prefer, but it is better to start with liquids, then soup and crackers, and gradually work up to solid foods.   Please notify your doctor immediately if you have any unusual bleeding, trouble breathing, redness and pain at the surgery site, drainage, fever, or pain not relieved by medication.    Additional Instructions:        Please contact your physician with any problems or Same Day Surgery at [phone], Monday through Friday 6 am to 4 pm, or [facility] number at [phone].

## 2023-04-05 NOTE — Anesthesia Preprocedure Evaluation (Addendum)
Anesthesia Evaluation  Patient identified by MRN, date of birth, ID band Patient awake    Reviewed: Allergy & Precautions, NPO status , Patient's Chart, lab work & pertinent test results  History of Anesthesia Complications Negative for: history of anesthetic complications  Airway Mallampati: IV   Neck ROM: Full    Dental  (+) Missing, Upper Dentures   Pulmonary neg pulmonary ROS   Pulmonary exam normal breath sounds clear to auscultation       Cardiovascular hypertension, + Peripheral Vascular Disease and +CHF  Normal cardiovascular exam+ dysrhythmias (a fib) + pacemaker (SSS)  Rhythm:Regular Rate:Normal  ECG 04/02/23: Atrial-sensed ventricular-paced rhythm  Echo 09/26/20: The left ventricular systolic function was normal with an EF of 55-60%.  The diastolic function was normal.  There was no valvular pathology.     Neuro/Psych negative neurological ROS     GI/Hepatic negative GI ROS,,,  Endo/Other  diabetes, Poorly Controlled, Type 2  Obesity   Renal/GU Renal disease (stage III CKD)   BPH with indwelling catheter    Musculoskeletal  (+) Arthritis ,    Abdominal   Peds  Hematology negative hematology ROS (+)   Anesthesia Other Findings Cardiology note 03/20/23:  1. Primary hypertension (Primary) 2. Dyspnea on exertion 3. Mitral valve insufficiency, unspecified etiology 4. Chronic heart failure with preserved ejection fraction (*) 5. 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 His probablility of MI or cardiac arrest is 0.79% per Chales Abrahams perioperative cardiac risk index.  - He will continue current medical therapy - Dietary compliance with low sodium diet and routine weight monitoring discussed in detail with patient. - Patient was advised to visit ED or contact this clinic should any significant or worsening cardiac symptoms occur before next visit.   Follow up  visit: 6 months    Reproductive/Obstetrics                             Anesthesia Physical Anesthesia Plan  ASA: 3  Anesthesia Plan: General   Post-op Pain Management:    Induction: Intravenous  PONV Risk Score and Plan: 2 and Propofol infusion, TIVA, Treatment may vary due to age or medical condition and Ondansetron  Airway Management Planned: Natural Airway  Additional Equipment:   Intra-op Plan:   Post-operative Plan:   Informed Consent: I have reviewed the patients History and Physical, chart, labs and discussed the procedure including the risks, benefits and alternatives for the proposed anesthesia with the patient or authorized representative who has indicated his/her understanding and acceptance.       Plan Discussed with: CRNA  Anesthesia Plan Comments: (LMA/GETA backup discussed.  Patient consented for risks of anesthesia including but not limited to:  - adverse reactions to medications - damage to eyes, teeth, lips or other oral mucosa - nerve damage due to positioning  - sore throat or hoarseness - damage to heart, brain, nerves, lungs, other parts of body or loss of life  Informed patient about role of CRNA in peri- and intra-operative care.  Patient voiced understanding.)        Anesthesia Quick Evaluation

## 2023-04-05 NOTE — Progress Notes (Signed)
   04/05/23 1200  Spiritual Encounters  Type of Visit Initial  Care provided to: Patient  Referral source Chaplain assessment  Reason for visit Routine spiritual support  OnCall Visit Yes  Spiritual Framework  Presenting Themes Meaning/purpose/sources of inspiration;Impactful experiences and emotions  Community/Connection Friend(s);Faith community  Interventions  Spiritual Care Interventions Made Established relationship of care and support;Compassionate presence;Reflective listening;Narrative/life review;Meaning making;Encouragement  Intervention Outcomes  Outcomes Connection to spiritual care;Awareness around self/spiritual resourses;Autonomy/agency;Awareness of health;Awareness of support;Connected to spiritual community  Spiritual Care Plan  Spiritual Care Issues Still Outstanding No further spiritual care needs at this time (see row info)   Chaplain visited with patient prior to procedure.  Pleasant mood and affect. Invited Chaplain to sit and converse. Chaplain spiritual support services available as the need arises.

## 2023-04-05 NOTE — Brief Op Note (Signed)
04/05/2023  4:07 PM  PATIENT:  Tyler [name]  77 y.o. male  PRE-OPERATIVE DIAGNOSIS:  osteomyelitis  POST-OPERATIVE DIAGNOSIS:  osteomyelitis  PROCEDURE:  Procedure(s): METATARSAL HEAD EXCISION THIRD & FOURTH (Right) BONE BIOPSY THIRD & FOURTH (Right)  SURGEON:  Surgeons and Role:    Felecia Shelling, [name] - Primary  PHYSICIAN ASSISTANT:   ASSISTANTS: none   ANESTHESIA:   local and IV sedation  EBL:  3 mL   BLOOD ADMINISTERED:none  DRAINS: none   LOCAL MEDICATIONS USED:  MARCAINE   , LIDOCAINE , and Amount: 20 ml  SPECIMEN:  Source of Specimen:  3rd and 4th metatarsals each sent for BOTH path and culture . Soft tissue culture   DISPOSITION OF SPECIMEN:  PATHOLOGY  COUNTS:  YES  TOURNIQUET:  Calf tourniquet inflated to without eschmarch exanguination  DICTATION: .Dragon Dictation  PLAN OF CARE: Discharge to home after PACU  PATIENT DISPOSITION:  PACU - hemodynamically stable.   Delay start of Pharmacological VTE agent (>24hrs) due to surgical blood loss or risk of bleeding: no  Felecia Shelling, [name] [facility]  Dr. Felecia Shelling, [name]    [address] [place], [place] [address]                Office [phone]  Fax [phone]

## 2023-04-06 ENCOUNTER — Emergency Department
Admission: EM | Admit: 2023-04-06 | Discharge: 2023-04-06 | Disposition: A | Discharge status: Home / Self Care | Payer: BC Managed Care – PPO | Attending: Student in an Organized Health Care Education/Training Program | Admitting: Student in an Organized Health Care Education/Training Program

## 2023-04-06 ENCOUNTER — Other Ambulatory Visit: Payer: Self-pay

## 2023-04-06 DIAGNOSIS — R58 Hemorrhage, not elsewhere classified: Secondary | ICD-10-CM | POA: Insufficient documentation

## 2023-04-06 DIAGNOSIS — L7622 Postprocedural hemorrhage and hematoma of skin and subcutaneous tissue following other procedure: Secondary | ICD-10-CM | POA: Diagnosis not present

## 2023-04-06 LAB — COMPREHENSIVE METABOLIC PANEL
ALT: 14 U/L (ref 0–44)
AST: 16 U/L (ref 15–41)
Albumin: 3.8 g/dL (ref 3.5–5.0)
Alkaline Phosphatase: 65 U/L (ref 38–126)
Anion gap: 9 (ref 5–15)
BUN: 28 mg/dL — ABNORMAL HIGH (ref 8–23)
CO2: 22 mmol/L (ref 22–32)
Calcium: 9 mg/dL (ref 8.9–10.3)
Chloride: 105 mmol/L (ref 98–111)
Creatinine, Ser: 1.35 mg/dL — ABNORMAL HIGH (ref 0.61–1.24)
GFR, Estimated: 54 mL/min — ABNORMAL LOW (ref >60)
Glucose, Bld: 338 mg/dL — ABNORMAL HIGH (ref 70–99)
Potassium: 4.5 mmol/L (ref 3.5–5.1)
Sodium: 136 mmol/L (ref 135–145)
Total Bilirubin: 0.7 mg/dL (ref 0.3–1.2)
Total Protein: 7.1 g/dL (ref 6.5–8.1)

## 2023-04-06 LAB — CBC WITH DIFFERENTIAL/PLATELET
Abs Immature Granulocytes: 0.04 10*3/uL (ref 0.00–0.07)
Basophils Absolute: 0.1 10*3/uL (ref 0.0–0.1)
Basophils Relative: 1 %
Eosinophils Absolute: 0.4 10*3/uL (ref 0.0–0.5)
Eosinophils Relative: 4 %
HCT: 39.1 % (ref 39.0–52.0)
Hemoglobin: 13.2 g/dL (ref 13.0–17.0)
Immature Granulocytes: 0 %
Lymphocytes Relative: 15 %
Lymphs Abs: 1.5 10*3/uL (ref 0.7–4.0)
MCH: 31.4 pg (ref 26.0–34.0)
MCHC: 33.8 g/dL (ref 30.0–36.0)
MCV: 93.1 fL (ref 80.0–100.0)
Monocytes Absolute: 0.7 10*3/uL (ref 0.1–1.0)
Monocytes Relative: 7 %
Neutro Abs: 7.4 10*3/uL (ref 1.7–7.7)
Neutrophils Relative %: 73 %
Platelets: 160 10*3/uL (ref 150–400)
RBC: 4.2 MIL/uL — ABNORMAL LOW (ref 4.22–5.81)
RDW: 14 % (ref 11.5–15.5)
WBC: 10 10*3/uL (ref 4.0–10.5)
nRBC: 0 % (ref 0.0–0.2)

## 2023-04-06 LAB — LACTIC ACID, PLASMA: Lactic Acid, Venous: 2 mmol/L (ref 0.5–1.9)

## 2023-04-06 NOTE — Anesthesia Postprocedure Evaluation (Signed)
Anesthesia Post Note  Patient: Tyler [name]  Procedure(s) Performed: METATARSAL HEAD EXCISION THIRD & FOURTH (Right: Toe) BONE BIOPSY THIRD & FOURTH (Right)  Patient location during evaluation: PACU Anesthesia Type: General Level of consciousness: awake and alert Pain management: pain level controlled Vital Signs Assessment: post-procedure vital signs reviewed and stable Respiratory status: spontaneous breathing, nonlabored ventilation and respiratory function stable Cardiovascular status: blood pressure returned to baseline and stable Postop Assessment: no apparent nausea or vomiting Anesthetic complications: no   No notable events documented.   Last Vitals:  Vitals:   04/05/23 1645 04/05/23 1701  BP: 139/74 (!) 147/79  Pulse: 71 82  Resp: (!) 22 20  Temp:  37 C  SpO2: 98% 98%    Last Pain:  Vitals:   04/05/23 1701  TempSrc: Temporal  PainSc: 0-No pain                 Foye Deer

## 2023-04-06 NOTE — Discharge Instructions (Signed)
Follow up with Dr. Excell Seltzer.  Take antibiotics as prescribed.

## 2023-04-06 NOTE — ED Provider Notes (Signed)
[facility] Provider Note    Event Date/Time   First MD Initiated Contact with Patient 04/06/23 [phone]     (approximate)   History   Post-op Problem   HPI  Tyler [name] is a 77 y.o. male with diagnosis of osteomyelitis status post podiatry surgery yesterday presents to the ER for some bleeding that he noticed when he woke up this morning.  States he otherwise feels well no fevers no chills.  No significant pain or discomfort.  Was discharged with antibiotics.  Has not taken his first dose yet as he is picking up from the pharmacy.     Physical Exam   Triage Vital Signs: ED Triage Vitals  Encounter Vitals Group     BP 04/06/23 0801 (!) 162/71     Systolic BP Percentile --      Diastolic BP Percentile --      Pulse Rate 04/06/23 0801 (!) 104     Resp 04/06/23 0801 20     Temp 04/06/23 0801 97.8 F (36.6 C)     Temp Source 04/06/23 0801 Oral     SpO2 04/06/23 0801 97 %     Weight 04/06/23 0802 260 lb (117.9 kg)     Height 04/06/23 0802 6\' 6"  (1.981 m)     Head Circumference --      Peak Flow --      Pain Score 04/06/23 0802 0     Pain Loc --      Pain Education --      Exclude from Growth Chart --     Most recent vital signs: Vitals:   04/06/23 0801  BP: (!) 162/71  Pulse: (!) 104  Resp: 20  Temp: 97.8 F (36.6 C)  SpO2: 97%     Constitutional: Alert  Eyes: Conjunctivae are normal.  Head: Atraumatic. Nose: No congestion/rhinnorhea. Mouth/Throat: Mucous membranes are moist.   Neck: Painless ROM.  Cardiovascular:   Good peripheral circulation. Respiratory: Normal respiratory effort.  No retractions.  Gastrointestinal: Soft and nontender.  Musculoskeletal: Good distal perfusion.  Dressing with some noted blood but no evidence of active hemorrhage.  No cellulitic changes.  No wound dehiscence.  Compartments soft. Neurologic:  MAE spontaneously. No gross focal neurologic deficits are appreciated.  Skin:  Skin is warm, dry and intact.  No rash noted. Psychiatric: Mood and affect are normal. Speech and behavior are normal.    ED Results / Procedures / Treatments   Labs (all labs ordered are listed, but only abnormal results are displayed) Labs Reviewed  LACTIC ACID, PLASMA - Abnormal; Notable for the following components:      Result Value   Lactic Acid, Venous 2.0 (*)    All other components within normal limits  COMPREHENSIVE METABOLIC PANEL - Abnormal; Notable for the following components:   Glucose, Bld 338 (*)    BUN 28 (*)    Creatinine, Ser 1.35 (*)    GFR, Estimated 54 (*)    All other components within normal limits  CBC WITH DIFFERENTIAL/PLATELET - Abnormal; Notable for the following components:   RBC 4.20 (*)    All other components within normal limits  LACTIC ACID, PLASMA     EKG     RADIOLOGY    PROCEDURES:  Critical Care performed:   Procedures   MEDICATIONS ORDERED IN ED: Medications - No data to display   IMPRESSION / MDM / ASSESSMENT AND PLAN / ED COURSE  I reviewed the triage vital signs  and the nursing notes.                              Differential diagnosis includes, but is not limited to, dehiscence, postop bleeding, postop pain, cellulitis, abscess  Patient presenting to the ER for evaluation of symptoms as described above.  Based on symptoms, risk factors and considered above differential, this presenting complaint could reflect a potentially life-threatening illness therefore the patient will be placed on continuous pulse oximetry and telemetry for monitoring.  Laboratory evaluation will be sent to evaluate for the above complaints.  Patient well-appearing in no acute distress.  No leukocytosis.  No fever.  Exam is reassuring.  Wound was redressed it appears appropriate postop.  Lactate ordered in triage is equivocal.  This not consistent with sepsis.  He is on antibiotics.  Do not feel the IV antibiotics clinically indicated at this time.  He has close outpatient  follow-up with podiatry.  We discussed signs and symptoms which she should return to the ER.  Patient agreeable to plan.       FINAL CLINICAL IMPRESSION(S) / ED DIAGNOSES   Final diagnoses:  Bleeding     Rx / DC Orders   ED Discharge Orders     None        Note:  This document was prepared using Dragon voice recognition software and may include unintentional dictation errors.    Willy Eddy, MD 04/06/23 [phone]

## 2023-04-06 NOTE — ED Notes (Signed)
Informed EDP lactic 2.0.

## 2023-04-06 NOTE — ED Triage Notes (Signed)
Pt to ED for post op problem. Pt had R third toe taken off 2-3 months ago then had F/U xray at Dr Logan Bores office which showed still infx to bone. Has F/U procedure scheduled for this coming Friday. Pt was concerned and came to ER because surgical site started bleeding last night. No bleeding at this time, foot currently wrapped. Pt alert, oriented with dry skin and unlabored respirations.

## 2023-04-07 ENCOUNTER — Encounter: Payer: Self-pay | Admitting: Podiatry

## 2023-04-08 LAB — AEROBIC/ANAEROBIC CULTURE W GRAM STAIN (SURGICAL/DEEP WOUND): Gram Stain: NONE SEEN

## 2023-04-10 ENCOUNTER — Telehealth: Payer: Self-pay | Admitting: Podiatry

## 2023-04-10 LAB — AEROBIC/ANAEROBIC CULTURE W GRAM STAIN (SURGICAL/DEEP WOUND)
Culture: NO GROWTH
Culture: NORMAL
Gram Stain: NONE SEEN
Gram Stain: NONE SEEN

## 2023-04-10 NOTE — Telephone Encounter (Signed)
Brandy pts case manger from [employer] is calling to give [place] her contact information if we are needing anything for pt. Asking if we had any concerns and if pt is coming to his appt.   I checked and he is coming to the appts.  Her number is [phone] ext [phone].

## 2023-04-12 ENCOUNTER — Ambulatory Visit (INDEPENDENT_AMBULATORY_CARE_PROVIDER_SITE_OTHER): Payer: BC Managed Care – PPO

## 2023-04-12 ENCOUNTER — Encounter: Payer: Self-pay | Admitting: Podiatry

## 2023-04-12 ENCOUNTER — Ambulatory Visit (INDEPENDENT_AMBULATORY_CARE_PROVIDER_SITE_OTHER): Payer: BC Managed Care – PPO | Admitting: Podiatry

## 2023-04-12 DIAGNOSIS — Z9889 Other specified postprocedural states: Secondary | ICD-10-CM | POA: Diagnosis not present

## 2023-04-12 MED ORDER — CIPROFLOXACIN HCL 500 MG PO TABS: 500.0000 mg | ORAL_TABLET | Freq: Two times a day (BID) | ORAL | 0 refills | Status: AC

## 2023-04-19 ENCOUNTER — Encounter: Payer: Self-pay | Admitting: Podiatry

## 2023-04-19 ENCOUNTER — Ambulatory Visit (INDEPENDENT_AMBULATORY_CARE_PROVIDER_SITE_OTHER): Payer: BC Managed Care – PPO | Admitting: Podiatry

## 2023-04-19 ENCOUNTER — Encounter: Payer: BC Managed Care – PPO | Admitting: Podiatry

## 2023-04-19 DIAGNOSIS — Z9889 Other specified postprocedural states: Secondary | ICD-10-CM

## 2023-04-19 NOTE — Progress Notes (Signed)
Chief Complaint  Patient presents with   Routine Post Op    "It's alright."    Subjective:  Patient presents today status post third metatarsal head resection with bone biopsy of the right foot.  Patient doing well.  Dressings are clean dry and intact.  WBAT surgical shoe.  No pain  Past Medical History:  Diagnosis Date   (HFpEF) heart failure with preserved ejection fraction (HCC) 03/01/2020   a.) TTE 03/01/2020: EF 55-60%, mod MAC, mod AoV sclerosis, triv AR, mild TR, mod MR, RVSP 50-59; b.) TTE 09/10/2020: EF 55-60%, mod MAC, mod AoV sclerosis, mild TR, 3+ MR, RVSP 50-59; c.) TTE 09/26/2020: EF 55-60%, mild LA dil, triv PR, mild MR/TR, RVSP 37-49   Adenoma of left adrenal gland    Anemia    Arthritis    Atrial fibrillation and flutter (HCC)    a.) CHA2DS2VASc = 5 (age x2, HFpEF, HTN, T2DM);  b.) s/p CTI ablation 09/07/2020; c.) rate/rhythm maintained on oral carvedilol; not on chronic anticoagulation therapy   CAD (coronary artery disease)    Cardiomegaly    CKD (chronic kidney disease), stage III (HCC)    DOE (dyspnea on exertion)    Drug-induced bradycardia    Gangrene of toe of left foot (HCC)    a.) 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    HLD (hyperlipidemia)    Hypertension    Long term current use of aspirin    Lymphedema of both lower extremities    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)    a.) s/p amputation 11/04/2022   Peripheral vascular disease (HCC)    Pleural effusion on right 09/09/2020   a.) s/p RIGHT thoracentesis with 2180 cc yield   Pneumonia    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   Sick sinus syndrome [facility])    a.) s/p MDT PPM placement 09/15/2020   T2DM (type 2 diabetes mellitus) (HCC)    Urinary retention    chronic, with indwelling Foley catheter and plans for a suprapubic   Wears dentures    full upper    Past Surgical History:  Procedure Laterality Date   AMPUTATION TOE Right 11/04/2022   Procedure: AMPUTATION TOE;  Surgeon: Felecia Shelling, [name];  [name];  Service: Podiatry;  Laterality: Right;   BONE BIOPSY Right 04/05/2023   Procedure: BONE BIOPSY THIRD & FOURTH;  Surgeon: Felecia Shelling, [name];  [name];  Service: Podiatry;  Laterality: Right;   CARDIAC ELECTROPHYSIOLOGY STUDY AND ABLATION N/A 09/07/2020   Procedure: CARDIAC EP STUDY AND ABLATION (CTI)   CATARACT EXTRACTION W/PHACO Right 01/16/2017   Procedure: CATARACT EXTRACTION PHACO AND INTRAOCULAR LENS PLACEMENT (IOC)  Right Complicated;  Surgeon: Lockie Mola, MD;  Location: [facility];  Service: Ophthalmology;  Laterality: Right;  IVA Block Healon 5 malyugin vision blue Diabetic - oral meds   CATARACT EXTRACTION W/PHACO  Left 10/23/2022   Procedure: CATARACT EXTRACTION PHACO AND INTRAOCULAR LENS PLACEMENT (IOC) LEFT DIABETIC;  Surgeon: Galen Manila, MD;  Location: [facility];  Service: Ophthalmology;  Laterality: Left;  Diabetic   COLONOSCOPY     LOWER EXTREMITY ANGIOGRAPHY Right 11/02/2022   Procedure: Lower Extremity Angiography;  Surgeon: Annice Needy, MD;  Location: [facility] INVASIVE CV LAB;  Service: Cardiovascular;  Laterality: Right;   METATARSAL HEAD EXCISION Left 07/05/2018   Procedure: RESECTION FIRST METATARSAL INFECTED BONE AND SOFT TISSUE;  Surgeon: Recardo Evangelist, [name];  [name];  Service: Podiatry;   Laterality: Left;   METATARSAL HEAD EXCISION Right 04/05/2023   Procedure: METATARSAL HEAD EXCISION THIRD & FOURTH;  Surgeon: Felecia Shelling, [name];  [name];  Service: Podiatry;  Laterality: Right;   PACEMAKER INSERTION  09/15/2020   TOE AMPUTATION Left    TONSILLECTOMY      No Known Allergies  Objective/Physical Exam Neurovascular status intact.  Incision well coapted with sutures intact.  Scant reduction of the erythema and edema to the foot.  No active bleeding.  Chronic edema noted to the lower extremity  Aerobic/Anaerobic Culture w Gram Stain (surgical/deep wound)     Component 2 wk ago  Specimen Description DIGIT Performed at [facility], [address] [place], [place] [address]  Special Requests 3RD METATARSAL SPEC B  Gram Stain NO WBC SEEN NO ORGANISMS SEEN  Culture No growth aerobically or anaerobically. Performed at [facility], [address] [place], [place] [address]  Report Status 04/10/2023 FINAL      Assessment: 1. s/p third metatarsal head resection with bone biopsy right foot.  Plan of Care:  -Patient was evaluated.  - Overall significant improvement.  Recommend Betadine with a light dressing daily -Continue postop shoe -Return to clinic 2 weeks suture removal and follow-up x-rays   Felecia Shelling, [name] [facility]  Dr. Felecia Shelling, [name]    [address] [place], [place] [address]                Office [phone]  Fax [phone]

## 2023-04-22 NOTE — Progress Notes (Signed)
Chief Complaint  Patient presents with   Routine Post Op    Subjective:  Patient presents today status post third metatarsal head resection with bone biopsy.  DOS: 04/05/2023.  Patient doing well.  WBAT surgical shoe  Past Medical History:  Diagnosis Date   (HFpEF) heart failure with preserved ejection fraction (HCC) 03/01/2020   a.) TTE 03/01/2020: EF 55-60%, mod MAC, mod AoV sclerosis, triv AR, mild TR, mod MR, RVSP 50-59; b.) TTE 09/10/2020: EF 55-60%, mod MAC, mod AoV sclerosis, mild TR, 3+ MR, RVSP 50-59; c.) TTE 09/26/2020: EF 55-60%, mild LA dil, triv PR, mild MR/TR, RVSP 37-49   Adenoma of left adrenal gland    Anemia    Arthritis    Atrial fibrillation and flutter (HCC)    a.) CHA2DS2VASc = 5 (age x2, HFpEF, HTN, T2DM);  b.) s/p CTI ablation 09/07/2020; c.) rate/rhythm maintained on oral carvedilol; not on chronic anticoagulation therapy   CAD (coronary artery disease)    Cardiomegaly    CKD (chronic kidney disease), stage III (HCC)    DOE (dyspnea on exertion)    Drug-induced bradycardia    Gangrene of toe of left foot (HCC)    a.) 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    HLD (hyperlipidemia)    Hypertension    Long term current use of aspirin    Lymphedema of both lower extremities    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)    a.) s/p amputation 11/04/2022   Peripheral vascular disease (HCC)    Pleural effusion on right 09/09/2020   a.) s/p RIGHT thoracentesis with 2180 cc yield   Pneumonia    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   Sick sinus syndrome [facility])    a.) s/p MDT PPM placement 09/15/2020   T2DM (type 2 diabetes mellitus) (HCC)    Urinary retention    chronic, with indwelling Foley catheter and plans for a suprapubic   Wears dentures    full upper    Past Surgical History:  Procedure Laterality Date   AMPUTATION TOE Right 11/04/2022   Procedure: AMPUTATION TOE;  Surgeon: Felecia Shelling, [name];  [name];  Service: Podiatry;  Laterality: Right;   BONE BIOPSY Right 04/05/2023   Procedure: BONE BIOPSY THIRD & FOURTH;  Surgeon: Felecia Shelling, [name];  [name];  Service: Podiatry;  Laterality: Right;   CARDIAC ELECTROPHYSIOLOGY STUDY AND ABLATION N/A 09/07/2020   Procedure: CARDIAC EP STUDY AND ABLATION (CTI)   CATARACT EXTRACTION W/PHACO Right 01/16/2017   Procedure: CATARACT EXTRACTION PHACO AND INTRAOCULAR LENS PLACEMENT (IOC)  Right Complicated;  Surgeon: Lockie Mola, MD;  Location: [facility];  Service: Ophthalmology;  Laterality: Right;  IVA Block Healon 5 malyugin vision blue Diabetic - oral meds   CATARACT EXTRACTION W/PHACO Left 10/23/2022   Procedure: CATARACT EXTRACTION PHACO AND INTRAOCULAR LENS PLACEMENT (IOC) LEFT DIABETIC;  Surgeon: Galen Manila, MD;  Location: [facility];  Service: Ophthalmology;  Laterality: Left;  Diabetic   COLONOSCOPY     LOWER EXTREMITY ANGIOGRAPHY Right 11/02/2022   Procedure: Lower Extremity Angiography;  Surgeon: Annice Needy, MD;  Location: [facility];  Service: Cardiovascular;  Laterality: Right;   METATARSAL HEAD EXCISION Left 07/05/2018   Procedure: RESECTION FIRST METATARSAL INFECTED BONE AND SOFT TISSUE;  Surgeon: Recardo Evangelist, [name];  [name];  Service: Podiatry;  Laterality: Left;   METATARSAL HEAD EXCISION Right 04/05/2023    Procedure: METATARSAL HEAD EXCISION THIRD & FOURTH;  Surgeon: Felecia Shelling, [name];  [name];  Service: Podiatry;  Laterality: Right;   PACEMAKER INSERTION  09/15/2020   TOE AMPUTATION Left    TONSILLECTOMY      No Known Allergies  Objective/Physical Exam Neurovascular status intact.  Incision well coapted with sutures intact.  Persistent edema noted to the foot.  Radiographic Exam RT foot 04/12/2023:  Absence of the third and fourth metatarsals as well as third toe amputation noted.  Clean osteotomy sites.  No gas within the tissues or erosions concerning for osteomyelitis.  Assessment: 1. s/p third metatarsal and fourth metatarsal head resections right.  Bone biopsy right foot. DOS: 04/05/2023   Plan of Care:  -Patient was evaluated. X-rays reviewed - Bone pathology pending -Continue antibiotics as prescribed -Recommend Betadine wet-to-dry dressings daily.  Patient comfortable changing his dressings -Return to clinic 1 week  Felecia Shelling, [name] [facility]  Dr. Felecia Shelling, [name]    [address] [place], [place] [address]                Office [phone]  Fax [phone]

## 2023-05-03 ENCOUNTER — Encounter: Payer: BC Managed Care – PPO | Admitting: Podiatry

## 2023-05-03 ENCOUNTER — Ambulatory Visit: Payer: Medicare Other | Admitting: Podiatry

## 2023-05-03 ENCOUNTER — Ambulatory Visit (INDEPENDENT_AMBULATORY_CARE_PROVIDER_SITE_OTHER): Payer: BC Managed Care – PPO

## 2023-05-03 ENCOUNTER — Encounter: Payer: Self-pay | Admitting: Podiatry

## 2023-05-03 DIAGNOSIS — Z9889 Other specified postprocedural states: Secondary | ICD-10-CM | POA: Diagnosis not present

## 2023-05-03 DIAGNOSIS — M869 Osteomyelitis, unspecified: Secondary | ICD-10-CM

## 2023-05-03 NOTE — Progress Notes (Unsigned)
Chief Complaint  Patient presents with   Routine Post Op    POV #3 DOS 04/05/23 --- 3RD AND 4TH METATARSAL HEAD RESECTIONS RT FOOT, BONE BIOPSY 3RD AND 4TH METATARSAL RT FOOT "As far as I know it's doing well."    Subjective:  Patient presents today status post third metatarsal head resection with bone biopsy of the right foot.  Patient doing well.  Patient has no significant improvement.  WBAT surgical shoe.  No pain  Past Medical History:  Diagnosis Date   (HFpEF) heart failure with preserved ejection fraction (HCC) 03/01/2020   a.) TTE 03/01/2020: EF 55-60%, mod MAC, mod AoV sclerosis, triv AR, mild TR, mod MR, RVSP 50-59; b.) TTE 09/10/2020: EF 55-60%, mod MAC, mod AoV sclerosis, mild TR, 3+ MR, RVSP 50-59; c.) TTE 09/26/2020: EF 55-60%, mild LA dil, triv PR, mild MR/TR, RVSP 37-49   Adenoma of left adrenal gland    Anemia    Arthritis    Atrial fibrillation and flutter (HCC)    a.) CHA2DS2VASc = 5 (age x2, HFpEF, HTN, T2DM);  b.) s/p CTI ablation 09/07/2020; c.) rate/rhythm maintained on oral carvedilol; not on chronic anticoagulation therapy   CAD (coronary artery disease)    Cardiomegaly    CKD (chronic kidney disease), stage III (HCC)    DOE (dyspnea on exertion)    Drug-induced bradycardia    Gangrene of toe of left foot (HCC)    a.) 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    HLD (hyperlipidemia)    Hypertension    Long term current use of aspirin    Lymphedema of both lower extremities    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)    a.) s/p amputation 11/04/2022   Peripheral vascular disease (HCC)    Pleural effusion on right 09/09/2020   a.) s/p RIGHT thoracentesis with 2180 cc yield   Pneumonia    Presence of permanent cardiac pacemaker 09/10/2020   a.) TVP placement 09/10/2020 due to intermittent CHB in setting of urosepsis; b.) s/p PPM placement 09/15/2020: MDT Azure XT DR (SN:  [id number])   Pulmonary hypertension (HCC) 03/01/2020   a.) TTE: 03/01/2020: RVSP 50-59; b.) TTE 09/10/2020: RVSP 50-59; c.) TTE 09/26/2020: RVSP 37-49   RA (rheumatoid arthritis) (HCC)    Rheumatic fever    Sepsis (HCC) 09/10/2020   a.) urosepsis --> BC x 2 sets and UC all grew out significant Proteus mirabilis; admitted to [facility] 09/07/2020 - 09/27/2020.   Sick sinus syndrome [facility])    a.) s/p MDT PPM placement 09/15/2020   T2DM (type 2 diabetes mellitus) (HCC)    Urinary retention    chronic, with indwelling Foley catheter and plans for a suprapubic   Wears dentures    full upper    Past Surgical History:  Procedure Laterality Date   AMPUTATION TOE Right 11/04/2022   Procedure: AMPUTATION TOE;  Surgeon: Felecia Shelling, [name];  [name];  Service: Podiatry;  Laterality: Right;   BONE BIOPSY Right 04/05/2023   Procedure: BONE BIOPSY THIRD & FOURTH;  Surgeon: Felecia Shelling, [name];  [name];  Service: Podiatry;  Laterality: Right;   CARDIAC ELECTROPHYSIOLOGY STUDY AND ABLATION N/A 09/07/2020   Procedure: CARDIAC EP STUDY AND ABLATION (CTI)   CATARACT EXTRACTION W/PHACO Right 01/16/2017   Procedure: CATARACT EXTRACTION PHACO AND INTRAOCULAR LENS PLACEMENT (IOC)  Right Complicated;  Surgeon: Lockie Mola, MD;  Location:  [facility];  Service: Ophthalmology;  Laterality: Right;  IVA Block Healon 5 malyugin vision blue Diabetic - oral meds   CATARACT EXTRACTION W/PHACO Left 10/23/2022   Procedure: CATARACT EXTRACTION PHACO AND INTRAOCULAR LENS PLACEMENT (IOC) LEFT DIABETIC;  Surgeon: Galen Manila, MD;  Location: [facility];  Service: Ophthalmology;  Laterality: Left;  Diabetic   COLONOSCOPY     LOWER EXTREMITY ANGIOGRAPHY Right 11/02/2022   Procedure: Lower Extremity Angiography;  Surgeon: Annice Needy, MD;  Location: [facility];  Service: Cardiovascular;  Laterality: Right;   METATARSAL HEAD EXCISION Left 07/05/2018    Procedure: RESECTION FIRST METATARSAL INFECTED BONE AND SOFT TISSUE;  Surgeon: Recardo Evangelist, [name];  [name];  Service: Podiatry;  Laterality: Left;   METATARSAL HEAD EXCISION Right 04/05/2023   Procedure: METATARSAL HEAD EXCISION THIRD & FOURTH;  Surgeon: Felecia Shelling, [name];  [name];  Service: Podiatry;  Laterality: Right;   PACEMAKER INSERTION  09/15/2020   TOE AMPUTATION Left    TONSILLECTOMY      No Known Allergies  Objective/Physical Exam Neurovascular status intact.  Incision well coapted with sutures intact.  Scant reduction of the erythema and edema to the foot.  No active bleeding.  Chronic edema noted to the lower extremity  Aerobic/Anaerobic Culture w Gram Stain (surgical/deep wound)     Component 2 wk ago  Specimen Description DIGIT Performed at [facility], [address] [place], [place] [address]  Special Requests 3RD METATARSAL SPEC B  Gram Stain NO WBC SEEN NO ORGANISMS SEEN  Culture No growth aerobically or anaerobically. Performed at [facility], [address] [place], [place] [address]  Report Status 04/10/2023 FINAL     Radiographic exam RT foot 05/03/2023: Stable.  Dorsal subluxation of the second toe at the level of the MTP with hammertoe contracture deformity.  Absence of the third and fourth metatarsal heads with amputation of the third toe.  Osteotomies appear stable.  No obvious sign of erosions or irregularities concerning for osteomyelitis  Assessment: 1. s/p third metatarsal head resection with bone biopsy right foot.  Plan of Care:  -Patient was evaluated.  Erythema and edema appears significantly improved and mostly resolved -Sutures removed today.  The incision actually is well coapted and healed. -Continue diabetic shoes -Return to clinic 3 months routine footcare   Felecia Shelling, [name] [facility]  Dr. Felecia Shelling, [name]    [address] [place], [place] [address]                Office [phone]  Fax [phone]

## 2023-05-16 ENCOUNTER — Ambulatory Visit (INDEPENDENT_AMBULATORY_CARE_PROVIDER_SITE_OTHER): Payer: BC Managed Care – PPO | Admitting: Podiatry

## 2023-05-16 DIAGNOSIS — E118 Type 2 diabetes mellitus with unspecified complications: Secondary | ICD-10-CM

## 2023-05-16 DIAGNOSIS — L97522 Non-pressure chronic ulcer of other part of left foot with fat layer exposed: Secondary | ICD-10-CM | POA: Diagnosis not present

## 2023-05-16 NOTE — Progress Notes (Signed)
Subjective:  Patient ID: Tyler [name], male    DOB: [birth year],  MRN: [id number]  Chief Complaint  Patient presents with   Foot Ulcer    77 y.o. male presents for wound care.  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  He states that he wanted to get it evaluated has been present for quite some time.  He is known to [name].  He wants to make sure is not infected.  He has not seen anyone as prior to seeing me.  He is a diabetic.  His last A1c was 6.4.   Review of Systems: Negative except as noted in the HPI. Denies N/V/F/Ch.  Past Medical History:  Diagnosis Date   (HFpEF) heart failure with preserved ejection fraction (HCC) 03/01/2020   a.) TTE 03/01/2020: EF 55-60%, mod MAC, mod AoV sclerosis, triv AR, mild TR, mod MR, RVSP 50-59; b.) TTE 09/10/2020: EF 55-60%, mod MAC, mod AoV sclerosis, mild TR, 3+ MR, RVSP 50-59; c.) TTE 09/26/2020: EF 55-60%, mild LA dil, triv PR, mild MR/TR, RVSP 37-49   Adenoma of left adrenal gland    Anemia    Arthritis    Atrial fibrillation and flutter (HCC)    a.) CHA2DS2VASc = 5 (age x2, HFpEF, HTN, T2DM);  b.) s/p CTI ablation 09/07/2020; c.) rate/rhythm maintained on oral carvedilol; not on chronic anticoagulation therapy   CAD (coronary artery disease)    Cardiomegaly    CKD (chronic kidney disease), stage III (HCC)    DOE (dyspnea on exertion)    Drug-induced bradycardia    Gangrene of toe of left foot (HCC)    a.) 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    HLD (hyperlipidemia)    Hypertension    Long term current use of aspirin    Lymphedema of both lower extremities    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)    a.) s/p amputation 11/04/2022   Peripheral vascular disease (HCC)    Pleural effusion on right 09/09/2020   a.) s/p RIGHT thoracentesis with 2180 cc yield   Pneumonia    Presence of permanent cardiac pacemaker 09/10/2020    a.) TVP placement 09/10/2020 due to intermittent CHB in setting of urosepsis; b.) s/p PPM placement 09/15/2020: MDT Azure XT DR (SN: [id number])   Pulmonary hypertension (HCC) 03/01/2020   a.) TTE: 03/01/2020: RVSP 50-59; b.) TTE 09/10/2020: RVSP 50-59; c.) TTE 09/26/2020: RVSP 37-49   RA (rheumatoid arthritis) (HCC)    Rheumatic fever    Sepsis (HCC) 09/10/2020   a.) urosepsis --> BC x 2 sets and UC all grew out significant Proteus mirabilis; admitted to [facility] 09/07/2020 - 09/27/2020.   Sick sinus syndrome [facility])    a.) s/p MDT PPM placement 09/15/2020   T2DM (type 2 diabetes mellitus) (HCC)    Urinary retention    chronic, with indwelling Foley catheter and plans for a suprapubic   Wears dentures    full upper    Current Outpatient Medications:    aspirin EC 81 MG tablet, Take 1 tablet (81 mg total) by mouth daily. Swallow whole., Disp: 30 tablet, Rfl: 0   carvedilol (COREG) 6.25 MG tablet, Take 1 tablet (6.25 mg total) by mouth 2 (two) times daily., Disp: 30 tablet, Rfl: 0   furosemide (LASIX) 40 MG tablet, Take 40 mg by mouth daily., Disp: , Rfl:    glipiZIDE (GLUCOTROL) 10 MG  tablet, Take 10 mg by mouth 2 (two) times daily., Disp: , Rfl:    lisinopril (ZESTRIL) 40 MG tablet, Take 1 tablet (40 mg total) by mouth daily., Disp: 30 tablet, Rfl: 0   metFORMIN (GLUCOPHAGE) 1000 MG tablet, Take 1,000 mg by mouth 2 (two) times daily., Disp: , Rfl:    metolazone (ZAROXOLYN) 2.5 MG tablet, Take 2.5 mg by mouth once a week. Monday, Disp: , Rfl:    Multiple Vitamins-Minerals (MULTIVITAMIN WITH MINERALS) tablet, Take 1 tablet by mouth daily., Disp: , Rfl:    OVER THE COUNTER MEDICATION, 1 Scoop daily. Super Beets Powder, Disp: , Rfl:   Social History   Tobacco Use  Smoking Status Never  Smokeless Tobacco Never    No Known Allergies Objective:  There were no vitals filed for this visit. There is no height or weight on file to calculate BMI. Constitutional Well  developed. Well nourished.  Vascular Dorsalis pedis pulses palpable bilaterally. Posterior tibial pulses palpable bilaterally. Capillary refill normal to all digits.  No cyanosis or clubbing noted. Pedal hair growth normal.  Neurologic Normal speech. Oriented to person, place, and time. Protective sensation absent  Dermatologic Wound Location: Left midfoot Charcot wound fat layer exposed does not probe down to bone no purulent drainage noted no malodor present Wound Base: Mixed Granular/Fibrotic Peri-wound: Calloused Exudate: Scant/small amount Serosanguinous exudate Wound Measurements: -See above  Orthopedic: No pain to palpation either foot.   Radiographs: None Assessment:   1. Ulcer of left foot with fat layer exposed (HCC)   2. Type 2 diabetes mellitus with complication, without long-term current use of insulin (HCC)    Plan:  Patient was evaluated and treated and all questions answered.  Ulcer left plantar Charcot wound fat layer exposed -Debridement as below. -Dressed with Betadine wet-to-dry, DSD. -Continue off-loading with surgical shoe. -Patient is scheduled to see [name] in 3 weeks  Procedure: Excisional Debridement of Wound Tool: Sharp chisel blade/tissue nipper Rationale: Removal of non-viable soft tissue from the wound to promote healing.  Anesthesia: none Pre-Debridement Wound Measurements: 2 cm x 1.5 cm x 0.4 cm  Post-Debridement Wound Measurements: 2.5 cm x 1.7 cm x 0.4 cm  Type of Debridement: Sharp Excisional Tissue Removed: Non-viable soft tissue Blood loss: Minimal (<50cc) Depth of Debridement: subcutaneous tissue. Technique: Sharp excisional debridement to bleeding, viable wound base.  Wound Progress: This is my initial evaluation of continue monitor the progression of the wound Site healing conversation 7 Dressing: Dry, sterile, compression dressing. Disposition: Patient tolerated procedure well. Patient to return in 1 week for follow-up.  No  follow-ups on file.

## 2023-05-27 DIAGNOSIS — I495 Sick sinus syndrome: Secondary | ICD-10-CM | POA: Diagnosis not present

## 2023-05-31 ENCOUNTER — Ambulatory Visit (INDEPENDENT_AMBULATORY_CARE_PROVIDER_SITE_OTHER): Payer: BC Managed Care – PPO | Admitting: Podiatry

## 2023-05-31 DIAGNOSIS — L97522 Non-pressure chronic ulcer of other part of left foot with fat layer exposed: Secondary | ICD-10-CM | POA: Diagnosis not present

## 2023-05-31 DIAGNOSIS — E0843 Diabetes mellitus due to underlying condition with diabetic autonomic (poly)neuropathy: Secondary | ICD-10-CM | POA: Diagnosis not present

## 2023-05-31 MED ORDER — GENTAMICIN SULFATE 0.1 % EX CREA: 1.0000 | TOPICAL_CREAM | Freq: Two times a day (BID) | CUTANEOUS | 1 refills | Status: DC

## 2023-05-31 NOTE — Progress Notes (Signed)
No chief complaint on file.   Subjective:  Patient presents today status post third metatarsal head resection with bone biopsy of the right foot.  Patient doing well in regards to the right foot.  Patient states that recently he developed an ulcer to the plantar aspect of the left foot.  Onset about a month ago.  Currently he is not really applying any dressings to the foot.  He was seen here in the office 05/16/2023 with Dr. Allena Katz.  Presenting for follow-up  Past Medical History:  Diagnosis Date   (HFpEF) heart failure with preserved ejection fraction (HCC) 03/01/2020   a.) TTE 03/01/2020: EF 55-60%, mod MAC, mod AoV sclerosis, triv AR, mild TR, mod MR, RVSP 50-59; b.) TTE 09/10/2020: EF 55-60%, mod MAC, mod AoV sclerosis, mild TR, 3+ MR, RVSP 50-59; c.) TTE 09/26/2020: EF 55-60%, mild LA dil, triv PR, mild MR/TR, RVSP 37-49   Adenoma of left adrenal gland    Anemia    Arthritis    Atrial fibrillation and flutter (HCC)    a.) CHA2DS2VASc = 5 (age x2, HFpEF, HTN, T2DM);  b.) s/p CTI ablation 09/07/2020; c.) rate/rhythm maintained on oral carvedilol; not on chronic anticoagulation therapy   CAD (coronary artery disease)    Cardiomegaly    CKD (chronic kidney disease), stage III (HCC)    DOE (dyspnea on exertion)    Drug-induced bradycardia    Gangrene of toe of left foot (HCC)    a.) 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    HLD (hyperlipidemia)    Hypertension    Long term current use of aspirin    Lymphedema of both lower extremities    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)    a.) s/p amputation 11/04/2022   Peripheral vascular disease (HCC)    Pleural effusion on right 09/09/2020   a.) s/p RIGHT thoracentesis with 2180 cc yield   Pneumonia    Presence of permanent cardiac pacemaker 09/10/2020   a.) TVP placement 09/10/2020 due to intermittent CHB in setting of urosepsis; b.) s/p PPM placement  09/15/2020: MDT Azure XT DR (SN: [id number])   Pulmonary hypertension (HCC) 03/01/2020   a.) TTE: 03/01/2020: RVSP 50-59; b.) TTE 09/10/2020: RVSP 50-59; c.) TTE 09/26/2020: RVSP 37-49   RA (rheumatoid arthritis) (HCC)    Rheumatic fever    Sepsis (HCC) 09/10/2020   a.) urosepsis --> BC x 2 sets and UC all grew out significant Proteus mirabilis; admitted to [facility] 09/07/2020 - 09/27/2020.   Sick sinus syndrome [facility])    a.) s/p MDT PPM placement 09/15/2020   T2DM (type 2 diabetes mellitus) (HCC)    Urinary retention    chronic, with indwelling Foley catheter and plans for a suprapubic   Wears dentures    full upper    Past Surgical History:  Procedure Laterality Date   AMPUTATION TOE Right 11/04/2022   Procedure: AMPUTATION TOE;  Surgeon: Felecia Shelling, [name];  [name];  Service: Podiatry;  Laterality: Right;   BONE BIOPSY Right 04/05/2023   Procedure: BONE BIOPSY THIRD & FOURTH;  Surgeon: Felecia Shelling, [name];  [name];  Service: Podiatry;  Laterality: Right;   CARDIAC ELECTROPHYSIOLOGY STUDY AND ABLATION N/A 09/07/2020   Procedure: CARDIAC EP STUDY AND ABLATION (CTI)   CATARACT EXTRACTION W/PHACO Right 01/16/2017   Procedure: CATARACT EXTRACTION PHACO AND INTRAOCULAR LENS PLACEMENT (IOC)  Right Complicated;  Surgeon:  Lockie Mola, MD;  Location: [facility];  Service: Ophthalmology;  Laterality: Right;  IVA Block Healon 5 malyugin vision blue Diabetic - oral meds   CATARACT EXTRACTION W/PHACO Left 10/23/2022   Procedure: CATARACT EXTRACTION PHACO AND INTRAOCULAR LENS PLACEMENT (IOC) LEFT DIABETIC;  Surgeon: Galen Manila, MD;  Location: [facility];  Service: Ophthalmology;  Laterality: Left;  Diabetic   COLONOSCOPY     LOWER EXTREMITY ANGIOGRAPHY Right 11/02/2022   Procedure: Lower Extremity Angiography;  Surgeon: Annice Needy, MD;  Location: [facility];  Service: Cardiovascular;  Laterality: Right;   METATARSAL  HEAD EXCISION Left 07/05/2018   Procedure: RESECTION FIRST METATARSAL INFECTED BONE AND SOFT TISSUE;  Surgeon: Recardo Evangelist, [name];  [name];  Service: Podiatry;  Laterality: Left;   METATARSAL HEAD EXCISION Right 04/05/2023   Procedure: METATARSAL HEAD EXCISION THIRD & FOURTH;  Surgeon: Felecia Shelling, [name];  [name];  Service: Podiatry;  Laterality: Right;   PACEMAKER INSERTION  09/15/2020   TOE AMPUTATION Left    TONSILLECTOMY      No Known Allergies  Objective/Physical Exam Neurovascular status intact.  Incision well coapted with sutures intact.  Scant reduction of the erythema and edema to the foot.  No active bleeding.  Chronic edema noted to the lower extremity  Aerobic/Anaerobic Culture w Gram Stain (surgical/deep wound)     Component 2 wk ago  Specimen Description DIGIT Performed at [facility], [address] [place], [place] [address]  Special Requests 3RD METATARSAL SPEC B  Gram Stain NO WBC SEEN NO ORGANISMS SEEN  Culture No growth aerobically or anaerobically. Performed at [facility], [address], [place], [place] [address]  Report Status 04/10/2023 FINAL     Radiographic exam RT foot 05/03/2023: Stable.  Dorsal subluxation of the second toe at the level of the MTP with hammertoe contracture deformity.  Absence of the third and fourth metatarsal heads with amputation of the third toe.  Osteotomies appear stable.  No obvious sign of erosions or irregularities concerning for osteomyelitis  Assessment: 1. s/p third metatarsal head resection with bone biopsy right foot. 2.  Ulcer left foot secondary to diabetes mellitus.   Last A1c 10/30/2022 6.4  Plan of Care:  -Patient was evaluated.  Right foot stable -Medically necessary excisional debridement including subcutaneous tissue was performed using a tissue nipper to the left plantar foot ulcer.  Excisional debridement of the necrotic nonviable tissue down to healthier bleeding  viable tissue was performed with postdebridement measurement same as pre- -Prescription for gentamicin cream.  Apply daily with a dressing -The patient has a postsurgical shoe at home.  I do recommend that the patient wears a cam boot but he ultimately refused.  He says he would not wear the cam boot -Currently the ulcer appears stable without any clinical indication of cellulitis or underlying infection. -Return to clinic 3 weeks.  At this time we will need x-rays of the left foot  Felecia Shelling, [name] [facility]  Dr. Felecia Shelling, [name]    [address] [place], [place] [address]                Office [phone]  Fax [phone]

## 2023-05-31 NOTE — Addendum Note (Signed)
Addended by: Felecia Shelling on: 05/31/2023 08:39 AM   Modules accepted: Orders

## 2023-06-19 DIAGNOSIS — R059 Cough, unspecified: Secondary | ICD-10-CM | POA: Diagnosis not present

## 2023-06-19 DIAGNOSIS — J189 Pneumonia, unspecified organism: Secondary | ICD-10-CM | POA: Diagnosis not present

## 2023-06-19 DIAGNOSIS — R0602 Shortness of breath: Secondary | ICD-10-CM | POA: Diagnosis not present

## 2023-06-23 DIAGNOSIS — J189 Pneumonia, unspecified organism: Secondary | ICD-10-CM | POA: Diagnosis not present

## 2023-06-23 DIAGNOSIS — R600 Localized edema: Secondary | ICD-10-CM | POA: Diagnosis not present

## 2023-06-23 DIAGNOSIS — R635 Abnormal weight gain: Secondary | ICD-10-CM | POA: Diagnosis not present

## 2023-06-23 DIAGNOSIS — I509 Heart failure, unspecified: Secondary | ICD-10-CM | POA: Diagnosis not present

## 2023-06-25 ENCOUNTER — Ambulatory Visit (INDEPENDENT_AMBULATORY_CARE_PROVIDER_SITE_OTHER): Payer: BC Managed Care – PPO | Admitting: Podiatry

## 2023-06-25 ENCOUNTER — Ambulatory Visit (INDEPENDENT_AMBULATORY_CARE_PROVIDER_SITE_OTHER): Payer: BC Managed Care – PPO

## 2023-06-25 DIAGNOSIS — L97522 Non-pressure chronic ulcer of other part of left foot with fat layer exposed: Secondary | ICD-10-CM

## 2023-06-25 DIAGNOSIS — M869 Osteomyelitis, unspecified: Secondary | ICD-10-CM

## 2023-06-25 MED ORDER — SILVER SULFADIAZINE 1 % EX CREA: TOPICAL_CREAM | CUTANEOUS | 1 refills | Status: DC

## 2023-06-25 MED ORDER — AMOXICILLIN-POT CLAVULANATE 875-125 MG PO TABS: 1.0000 | ORAL_TABLET | Freq: Two times a day (BID) | ORAL | 0 refills | Status: DC

## 2023-06-25 NOTE — Progress Notes (Signed)
Chief Complaint  Patient presents with   Wound Check    "It was doing pretty good for the past three days then it started swelling."    Subjective:  Patient presents today status post third metatarsal head resection with bone biopsy of the right foot.  Patient doing well in regards to the right foot.  Presenting for follow-up evaluation of an ulcer to the plantar aspect of the left foot as well.  He states that he has been applying antibiotic cream and a light dressing daily.  Past Medical History:  Diagnosis Date   (HFpEF) heart failure with preserved ejection fraction (HCC) 03/01/2020   a.) TTE 03/01/2020: EF 55-60%, mod MAC, mod AoV sclerosis, triv AR, mild TR, mod MR, RVSP 50-59; b.) TTE 09/10/2020: EF 55-60%, mod MAC, mod AoV sclerosis, mild TR, 3+ MR, RVSP 50-59; c.) TTE 09/26/2020: EF 55-60%, mild LA dil, triv PR, mild MR/TR, RVSP 37-49   Adenoma of left adrenal gland    Anemia    Arthritis    Atrial fibrillation and flutter (HCC)    a.) CHA2DS2VASc = 5 (age x2, HFpEF, HTN, T2DM);  b.) s/p CTI ablation 09/07/2020; c.) rate/rhythm maintained on oral carvedilol; not on chronic anticoagulation therapy   CAD (coronary artery disease)    Cardiomegaly    CKD (chronic kidney disease), stage III (HCC)    DOE (dyspnea on exertion)    Drug-induced bradycardia    Gangrene of toe of left foot (HCC)    a.) 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    HLD (hyperlipidemia)    Hypertension    Long term current use of aspirin    Lymphedema of both lower extremities    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)    a.) s/p amputation 11/04/2022   Peripheral vascular disease (HCC)    Pleural effusion on right 09/09/2020   a.) s/p RIGHT thoracentesis with 2180 cc yield   Pneumonia    Presence of permanent cardiac pacemaker 09/10/2020   a.) TVP placement 09/10/2020 due to intermittent CHB in setting of urosepsis; b.)  s/p PPM placement 09/15/2020: MDT Azure XT DR (SN: [id number])   Pulmonary hypertension (HCC) 03/01/2020   a.) TTE: 03/01/2020: RVSP 50-59; b.) TTE 09/10/2020: RVSP 50-59; c.) TTE 09/26/2020: RVSP 37-49   RA (rheumatoid arthritis) (HCC)    Rheumatic fever    Sepsis (HCC) 09/10/2020   a.) urosepsis --> BC x 2 sets and UC all grew out significant Proteus mirabilis; admitted to [facility] 09/07/2020 - 09/27/2020.   Sick sinus syndrome [facility])    a.) s/p MDT PPM placement 09/15/2020   T2DM (type 2 diabetes mellitus) (HCC)    Urinary retention    chronic, with indwelling Foley catheter and plans for a suprapubic   Wears dentures    full upper    Past Surgical History:  Procedure Laterality Date   AMPUTATION TOE Right 11/04/2022   Procedure: AMPUTATION TOE;  Surgeon: Felecia Shelling, [name];  [name];  Service: Podiatry;  Laterality: Right;   BONE BIOPSY Right 04/05/2023   Procedure: BONE BIOPSY THIRD & FOURTH;  Surgeon: Felecia Shelling, [name];  [name];  Service: Podiatry;  Laterality: Right;   CARDIAC ELECTROPHYSIOLOGY STUDY AND ABLATION N/A 09/07/2020   Procedure: CARDIAC EP STUDY AND ABLATION (CTI)   CATARACT EXTRACTION W/PHACO Right 01/16/2017   Procedure: CATARACT EXTRACTION PHACO AND INTRAOCULAR LENS PLACEMENT (IOC)  Right Complicated;  Surgeon: Lockie Mola, MD;  Location: [facility];  Service: Ophthalmology;  Laterality: Right;  IVA Block Healon 5 malyugin vision blue Diabetic - oral meds   CATARACT EXTRACTION W/PHACO Left 10/23/2022   Procedure: CATARACT EXTRACTION PHACO AND INTRAOCULAR LENS PLACEMENT (IOC) LEFT DIABETIC;  Surgeon: Galen Manila, MD;  Location: [facility];  Service: Ophthalmology;  Laterality: Left;  Diabetic   COLONOSCOPY     LOWER EXTREMITY ANGIOGRAPHY Right 11/02/2022   Procedure: Lower Extremity Angiography;  Surgeon: Annice Needy, MD;  Location: [facility] INVASIVE CV LAB;  Service: Cardiovascular;  Laterality:  Right;   METATARSAL HEAD EXCISION Left 07/05/2018   Procedure: RESECTION FIRST METATARSAL INFECTED BONE AND SOFT TISSUE;  Surgeon: Recardo Evangelist, [name];  [name];  Service: Podiatry;  Laterality: Left;   METATARSAL HEAD EXCISION Right 04/05/2023   Procedure: METATARSAL HEAD EXCISION THIRD & FOURTH;  Surgeon: Felecia Shelling, [name];  [name];  Service: Podiatry;  Laterality: Right;   PACEMAKER INSERTION  09/15/2020   TOE AMPUTATION Left    TONSILLECTOMY      No Known Allergies     06/25/2023  Objective/Physical Exam Right foot doing well without any indication of infection.  Negative for any erythema or edema  Ulcer noted to the plantar aspect of the left foot measuring approximately 2.0 x 2.0 x 0.4 cm.  Granular wound base.  Currently no exposed bone muscle tendon ligament or joint.  No malodor.  Periwound is callused.  Radiographic exam RT foot 05/03/2023: Stable.  Dorsal subluxation of the second toe at the level of the MTP with hammertoe contracture deformity.  Absence of the third and fourth metatarsal heads with amputation of the third toe.  Osteotomies appear stable.  No obvious sign of erosions or irregularities concerning for osteomyelitis  Radiographic exam LT foot 06/25/2023: Absence of the left great toe noted with erosions noted along the first metatarsal compared to prior x-rays dated 07/03/2018.  Concern for possible osteomyelitis of the first metatarsal  Assessment: 1. s/p third metatarsal head resection with bone biopsy right foot; currently stable 2.  Ulcer left foot secondary to diabetes mellitus.   Last A1c 10/30/2022 6.4 3.  Concern for possible osteomyelitis left foot 4.  Superficial skin breakdown with wounds left anterior leg with associated edema  Plan of Care:  -Patient was evaluated.  X-rays reviewed.   -Right foot stable -Due to the concern for the osseous changes along the first metatarsal I did order MRI LT foot wo contrast at [facility]  [place] -Medically necessary excisional debridement including subcutaneous tissue was performed using a tissue nipper to the left plantar foot ulcer.  Excisional debridement of the necrotic nonviable tissue down to healthier bleeding viable tissue was performed with postdebridement measurement same as pre- -continue gentamicin cream left foot.  Apply daily with a dressing -Prescription for Silvadene cream apply daily to the left anterior leg wounds -Prescription for Augmentin 875/125 mg x 10 days - Again the patient declined offloading in a cam boot.  Today he is wearing his diabetic shoes instead of a postoperative shoe -Return to clinic 2 weeks  Felecia Shelling, [name] [facility]  Dr. Felecia Shelling, [name]    [address], [place] [address]  Office [phone]  Fax [phone]

## 2023-06-28 ENCOUNTER — Telehealth: Payer: Self-pay | Admitting: Podiatry

## 2023-06-28 ENCOUNTER — Other Ambulatory Visit: Payer: Self-pay | Admitting: Podiatry

## 2023-06-28 DIAGNOSIS — M869 Osteomyelitis, unspecified: Secondary | ICD-10-CM

## 2023-06-28 DIAGNOSIS — L97522 Non-pressure chronic ulcer of other part of left foot with fat layer exposed: Secondary | ICD-10-CM

## 2023-06-28 NOTE — Telephone Encounter (Signed)
Changed! :) -[name]

## 2023-06-28 NOTE — Telephone Encounter (Signed)
Patient called LM on VM that he is suppos to get a CT vs getting MRI. He states the place called to schedule and they said Dr Logan Bores needs to call or resend order.

## 2023-07-01 ENCOUNTER — Encounter: Payer: Self-pay | Admitting: Podiatry

## 2023-07-02 ENCOUNTER — Inpatient Hospital Stay
Admission: RE | Admit: 2023-07-02 | Discharge: 2023-07-02 | Discharge status: Home / Self Care | Payer: BC Managed Care – PPO | Source: Ambulatory Visit | Attending: Podiatry

## 2023-07-02 ENCOUNTER — Inpatient Hospital Stay: Payer: BC Managed Care – PPO

## 2023-07-02 ENCOUNTER — Inpatient Hospital Stay
Admission: EM | Admit: 2023-07-02 | Discharge: 2023-07-07 | DRG: 291 | Disposition: A | Discharge status: Home / Self Care | Payer: BC Managed Care – PPO | Attending: Internal Medicine | Admitting: Internal Medicine

## 2023-07-02 ENCOUNTER — Encounter: Payer: Self-pay | Admitting: Internal Medicine

## 2023-07-02 ENCOUNTER — Other Ambulatory Visit: Payer: Self-pay

## 2023-07-02 ENCOUNTER — Emergency Department: Payer: BC Managed Care – PPO

## 2023-07-02 DIAGNOSIS — E1151 Type 2 diabetes mellitus with diabetic peripheral angiopathy without gangrene: Secondary | ICD-10-CM | POA: Diagnosis present

## 2023-07-02 DIAGNOSIS — I509 Heart failure, unspecified: Secondary | ICD-10-CM | POA: Diagnosis not present

## 2023-07-02 DIAGNOSIS — M869 Osteomyelitis, unspecified: Secondary | ICD-10-CM

## 2023-07-02 DIAGNOSIS — I340 Nonrheumatic mitral (valve) insufficiency: Secondary | ICD-10-CM | POA: Diagnosis not present

## 2023-07-02 DIAGNOSIS — I5043 Acute on chronic combined systolic (congestive) and diastolic (congestive) heart failure: Secondary | ICD-10-CM | POA: Diagnosis present

## 2023-07-02 DIAGNOSIS — E669 Obesity, unspecified: Secondary | ICD-10-CM

## 2023-07-02 DIAGNOSIS — I5023 Acute on chronic systolic (congestive) heart failure: Secondary | ICD-10-CM | POA: Diagnosis present

## 2023-07-02 DIAGNOSIS — R000 Tachycardia, unspecified: Secondary | ICD-10-CM | POA: Diagnosis not present

## 2023-07-02 DIAGNOSIS — L97512 Non-pressure chronic ulcer of other part of right foot with fat layer exposed: Secondary | ICD-10-CM

## 2023-07-02 DIAGNOSIS — L03115 Cellulitis of right lower limb: Secondary | ICD-10-CM | POA: Diagnosis not present

## 2023-07-02 DIAGNOSIS — E11621 Type 2 diabetes mellitus with foot ulcer: Secondary | ICD-10-CM | POA: Diagnosis present

## 2023-07-02 DIAGNOSIS — Z89421 Acquired absence of other right toe(s): Secondary | ICD-10-CM | POA: Diagnosis not present

## 2023-07-02 DIAGNOSIS — I2510 Atherosclerotic heart disease of native coronary artery without angina pectoris: Secondary | ICD-10-CM | POA: Diagnosis present

## 2023-07-02 DIAGNOSIS — I272 Pulmonary hypertension, unspecified: Secondary | ICD-10-CM | POA: Diagnosis present

## 2023-07-02 DIAGNOSIS — I4819 Other persistent atrial fibrillation: Secondary | ICD-10-CM

## 2023-07-02 DIAGNOSIS — L97422 Non-pressure chronic ulcer of left heel and midfoot with fat layer exposed: Secondary | ICD-10-CM | POA: Diagnosis not present

## 2023-07-02 DIAGNOSIS — R339 Retention of urine, unspecified: Secondary | ICD-10-CM | POA: Diagnosis present

## 2023-07-02 DIAGNOSIS — I10 Essential (primary) hypertension: Secondary | ICD-10-CM | POA: Diagnosis not present

## 2023-07-02 DIAGNOSIS — J90 Pleural effusion, not elsewhere classified: Secondary | ICD-10-CM | POA: Diagnosis not present

## 2023-07-02 DIAGNOSIS — I5A Non-ischemic myocardial injury (non-traumatic): Secondary | ICD-10-CM | POA: Diagnosis not present

## 2023-07-02 DIAGNOSIS — I130 Hypertensive heart and chronic kidney disease with heart failure and stage 1 through stage 4 chronic kidney disease, or unspecified chronic kidney disease: Principal | ICD-10-CM | POA: Diagnosis present

## 2023-07-02 DIAGNOSIS — Z7982 Long term (current) use of aspirin: Secondary | ICD-10-CM

## 2023-07-02 DIAGNOSIS — R600 Localized edema: Secondary | ICD-10-CM | POA: Diagnosis not present

## 2023-07-02 DIAGNOSIS — R0602 Shortness of breath: Secondary | ICD-10-CM | POA: Diagnosis not present

## 2023-07-02 DIAGNOSIS — I5022 Chronic systolic (congestive) heart failure: Secondary | ICD-10-CM | POA: Diagnosis not present

## 2023-07-02 DIAGNOSIS — L97329 Non-pressure chronic ulcer of left ankle with unspecified severity: Secondary | ICD-10-CM | POA: Diagnosis present

## 2023-07-02 DIAGNOSIS — L97522 Non-pressure chronic ulcer of other part of left foot with fat layer exposed: Secondary | ICD-10-CM

## 2023-07-02 DIAGNOSIS — L97529 Non-pressure chronic ulcer of other part of left foot with unspecified severity: Secondary | ICD-10-CM | POA: Diagnosis not present

## 2023-07-02 DIAGNOSIS — I4892 Unspecified atrial flutter: Secondary | ICD-10-CM | POA: Diagnosis not present

## 2023-07-02 DIAGNOSIS — R0989 Other specified symptoms and signs involving the circulatory and respiratory systems: Secondary | ICD-10-CM | POA: Diagnosis not present

## 2023-07-02 DIAGNOSIS — N1831 Chronic kidney disease, stage 3a: Secondary | ICD-10-CM | POA: Diagnosis not present

## 2023-07-02 DIAGNOSIS — J9601 Acute respiratory failure with hypoxia: Secondary | ICD-10-CM | POA: Diagnosis not present

## 2023-07-02 DIAGNOSIS — N1832 Chronic kidney disease, stage 3b: Secondary | ICD-10-CM | POA: Diagnosis not present

## 2023-07-02 DIAGNOSIS — E1129 Type 2 diabetes mellitus with other diabetic kidney complication: Secondary | ICD-10-CM | POA: Diagnosis present

## 2023-07-02 DIAGNOSIS — R0601 Orthopnea: Secondary | ICD-10-CM | POA: Diagnosis not present

## 2023-07-02 DIAGNOSIS — M069 Rheumatoid arthritis, unspecified: Secondary | ICD-10-CM | POA: Diagnosis not present

## 2023-07-02 DIAGNOSIS — Z950 Presence of cardiac pacemaker: Secondary | ICD-10-CM

## 2023-07-02 DIAGNOSIS — R0609 Other forms of dyspnea: Secondary | ICD-10-CM | POA: Diagnosis not present

## 2023-07-02 DIAGNOSIS — E1122 Type 2 diabetes mellitus with diabetic chronic kidney disease: Secondary | ICD-10-CM | POA: Diagnosis not present

## 2023-07-02 DIAGNOSIS — I482 Chronic atrial fibrillation, unspecified: Secondary | ICD-10-CM | POA: Diagnosis not present

## 2023-07-02 DIAGNOSIS — L97421 Non-pressure chronic ulcer of left heel and midfoot limited to breakdown of skin: Secondary | ICD-10-CM | POA: Diagnosis not present

## 2023-07-02 DIAGNOSIS — R918 Other nonspecific abnormal finding of lung field: Secondary | ICD-10-CM | POA: Diagnosis not present

## 2023-07-02 DIAGNOSIS — I5031 Acute diastolic (congestive) heart failure: Secondary | ICD-10-CM | POA: Diagnosis not present

## 2023-07-02 DIAGNOSIS — E1165 Type 2 diabetes mellitus with hyperglycemia: Secondary | ICD-10-CM | POA: Diagnosis present

## 2023-07-02 DIAGNOSIS — M19071 Primary osteoarthritis, right ankle and foot: Secondary | ICD-10-CM | POA: Diagnosis not present

## 2023-07-02 DIAGNOSIS — M7989 Other specified soft tissue disorders: Secondary | ICD-10-CM | POA: Diagnosis not present

## 2023-07-02 DIAGNOSIS — Z6830 Body mass index (BMI) 30.0-30.9, adult: Secondary | ICD-10-CM

## 2023-07-02 DIAGNOSIS — E785 Hyperlipidemia, unspecified: Secondary | ICD-10-CM | POA: Diagnosis present

## 2023-07-02 DIAGNOSIS — I495 Sick sinus syndrome: Secondary | ICD-10-CM | POA: Diagnosis present

## 2023-07-02 DIAGNOSIS — Z8701 Personal history of pneumonia (recurrent): Secondary | ICD-10-CM

## 2023-07-02 DIAGNOSIS — Z79899 Other long term (current) drug therapy: Secondary | ICD-10-CM

## 2023-07-02 DIAGNOSIS — Z89412 Acquired absence of left great toe: Secondary | ICD-10-CM

## 2023-07-02 DIAGNOSIS — Z7984 Long term (current) use of oral hypoglycemic drugs: Secondary | ICD-10-CM

## 2023-07-02 DIAGNOSIS — I110 Hypertensive heart disease with heart failure: Secondary | ICD-10-CM | POA: Diagnosis not present

## 2023-07-02 DIAGNOSIS — L97519 Non-pressure chronic ulcer of other part of right foot with unspecified severity: Secondary | ICD-10-CM | POA: Diagnosis not present

## 2023-07-02 DIAGNOSIS — L97509 Non-pressure chronic ulcer of other part of unspecified foot with unspecified severity: Secondary | ICD-10-CM | POA: Diagnosis not present

## 2023-07-02 DIAGNOSIS — M8580 Other specified disorders of bone density and structure, unspecified site: Secondary | ICD-10-CM | POA: Diagnosis not present

## 2023-07-02 DIAGNOSIS — I442 Atrioventricular block, complete: Secondary | ICD-10-CM | POA: Diagnosis present

## 2023-07-02 DIAGNOSIS — R059 Cough, unspecified: Secondary | ICD-10-CM | POA: Diagnosis not present

## 2023-07-02 DIAGNOSIS — I5033 Acute on chronic diastolic (congestive) heart failure: Secondary | ICD-10-CM | POA: Diagnosis not present

## 2023-07-02 DIAGNOSIS — M19072 Primary osteoarthritis, left ankle and foot: Secondary | ICD-10-CM | POA: Diagnosis not present

## 2023-07-02 LAB — COMPREHENSIVE METABOLIC PANEL
ALT: 26 U/L (ref 0–44)
AST: 27 U/L (ref 15–41)
Albumin: 3.5 g/dL (ref 3.5–5.0)
Alkaline Phosphatase: 69 U/L (ref 38–126)
Anion gap: 10 (ref 5–15)
BUN: 36 mg/dL — ABNORMAL HIGH (ref 8–23)
CO2: 24 mmol/L (ref 22–32)
Calcium: 8.8 mg/dL — ABNORMAL LOW (ref 8.9–10.3)
Chloride: 102 mmol/L (ref 98–111)
Creatinine, Ser: 1.45 mg/dL — ABNORMAL HIGH (ref 0.61–1.24)
GFR, Estimated: 49 mL/min — ABNORMAL LOW (ref >60)
Glucose, Bld: 227 mg/dL — ABNORMAL HIGH (ref 70–99)
Potassium: 4.1 mmol/L (ref 3.5–5.1)
Sodium: 136 mmol/L (ref 135–145)
Total Bilirubin: 1 mg/dL (ref 0.3–1.2)
Total Protein: 7.4 g/dL (ref 6.5–8.1)

## 2023-07-02 LAB — CBC
HCT: 36.8 % — ABNORMAL LOW (ref 39.0–52.0)
Hemoglobin: 12 g/dL — ABNORMAL LOW (ref 13.0–17.0)
MCH: 30.8 pg (ref 26.0–34.0)
MCHC: 32.6 g/dL (ref 30.0–36.0)
MCV: 94.4 fL (ref 80.0–100.0)
Platelets: 239 10*3/uL (ref 150–400)
RBC: 3.9 MIL/uL — ABNORMAL LOW (ref 4.22–5.81)
RDW: 14.7 % (ref 11.5–15.5)
WBC: 10 10*3/uL (ref 4.0–10.5)
nRBC: 0 % (ref 0.0–0.2)

## 2023-07-02 LAB — TROPONIN I (HIGH SENSITIVITY)
Troponin I (High Sensitivity): 24 ng/L — ABNORMAL HIGH (ref <18)
Troponin I (High Sensitivity): 27 ng/L — ABNORMAL HIGH (ref <18)
Troponin I (High Sensitivity): 28 ng/L — ABNORMAL HIGH (ref <18)

## 2023-07-02 LAB — BRAIN NATRIURETIC PEPTIDE: B Natriuretic Peptide: 1066.9 pg/mL — ABNORMAL HIGH (ref 0.0–100.0)

## 2023-07-02 LAB — CBG MONITORING, ED: Glucose-Capillary: 191 mg/dL — ABNORMAL HIGH (ref 70–99)

## 2023-07-02 LAB — SEDIMENTATION RATE: Sed Rate: 60 mm/h — ABNORMAL HIGH (ref 0–20)

## 2023-07-02 MED ORDER — DM-GUAIFENESIN ER 30-600 MG PO TB12
1.0000 | ORAL_TABLET | Freq: Two times a day (BID) | ORAL | Status: DC | PRN
  Administered 2023-07-04 – 2023-07-05 (×4): 1 via ORAL
  Filled 2023-07-02 (×5): qty 1

## 2023-07-02 MED ORDER — ONDANSETRON HCL 4 MG/2ML IJ SOLN: 4.0000 mg | Freq: Three times a day (TID) | INTRAMUSCULAR | Status: DC | PRN

## 2023-07-02 MED ORDER — FUROSEMIDE 10 MG/ML IJ SOLN
80.0000 mg | Freq: Once | INTRAMUSCULAR | Status: AC
  Administered 2023-07-02: 80 mg via INTRAVENOUS
  Filled 2023-07-02: qty 8

## 2023-07-02 MED ORDER — ENOXAPARIN SODIUM 60 MG/0.6ML IJ SOSY
60.0000 mg | PREFILLED_SYRINGE | INTRAMUSCULAR | Status: DC
  Administered 2023-07-02 – 2023-07-05 (×4): 60 mg via SUBCUTANEOUS
  Filled 2023-07-02 (×4): qty 0.6

## 2023-07-02 MED ORDER — FUROSEMIDE 10 MG/ML IJ SOLN
40.0000 mg | Freq: Two times a day (BID) | INTRAMUSCULAR | Status: DC
  Administered 2023-07-03 – 2023-07-06 (×7): 40 mg via INTRAVENOUS
  Filled 2023-07-02 (×8): qty 4

## 2023-07-02 MED ORDER — SODIUM CHLORIDE 0.9 % IV SOLN
2.0000 g | INTRAVENOUS | Status: DC
  Administered 2023-07-02 – 2023-07-06 (×5): 2 g via INTRAVENOUS
  Filled 2023-07-02 (×7): qty 20

## 2023-07-02 MED ORDER — HYDRALAZINE HCL 20 MG/ML IJ SOLN: 5.0000 mg | INTRAMUSCULAR | Status: DC | PRN

## 2023-07-02 MED ORDER — ADULT MULTIVITAMIN W/MINERALS CH
1.0000 | ORAL_TABLET | Freq: Every day | ORAL | Status: DC
  Administered 2023-07-03 – 2023-07-07 (×5): 1 via ORAL
  Filled 2023-07-02 (×5): qty 1

## 2023-07-02 MED ORDER — ADENOSINE 6 MG/2ML IV SOLN
INTRAVENOUS | Status: AC
  Filled 2023-07-02: qty 2

## 2023-07-02 MED ORDER — INSULIN ASPART 100 UNIT/ML IJ SOLN
0.0000 [IU] | Freq: Three times a day (TID) | INTRAMUSCULAR | Status: DC
  Administered 2023-07-03 – 2023-07-04 (×4): 2 [IU] via SUBCUTANEOUS
  Administered 2023-07-04 – 2023-07-05 (×2): 3 [IU] via SUBCUTANEOUS
  Administered 2023-07-05: 2 [IU] via SUBCUTANEOUS
  Administered 2023-07-05: 3 [IU] via SUBCUTANEOUS
  Administered 2023-07-06 (×2): 2 [IU] via SUBCUTANEOUS
  Administered 2023-07-06: 3 [IU] via SUBCUTANEOUS
  Administered 2023-07-07: 2 [IU] via SUBCUTANEOUS
  Administered 2023-07-07: 3 [IU] via SUBCUTANEOUS
  Filled 2023-07-02 (×11): qty 1

## 2023-07-02 MED ORDER — CARVEDILOL 6.25 MG PO TABS
6.2500 mg | ORAL_TABLET | Freq: Two times a day (BID) | ORAL | Status: DC
  Administered 2023-07-03 – 2023-07-05 (×5): 6.25 mg via ORAL
  Filled 2023-07-02 (×5): qty 1

## 2023-07-02 MED ORDER — ASPIRIN 81 MG PO TBEC
81.0000 mg | DELAYED_RELEASE_TABLET | Freq: Every day | ORAL | Status: DC
  Administered 2023-07-03 – 2023-07-07 (×5): 81 mg via ORAL
  Filled 2023-07-02 (×5): qty 1

## 2023-07-02 MED ORDER — ASPIRIN 81 MG PO TBEC: 81.0000 mg | DELAYED_RELEASE_TABLET | Freq: Every day | ORAL | Status: DC

## 2023-07-02 MED ORDER — ALBUTEROL SULFATE (2.5 MG/3ML) 0.083% IN NEBU
2.5000 mg | INHALATION_SOLUTION | RESPIRATORY_TRACT | Status: DC | PRN
  Administered 2023-07-03: 2.5 mg via RESPIRATORY_TRACT
  Filled 2023-07-02 (×2): qty 3

## 2023-07-02 MED ORDER — GENTAMICIN SULFATE 0.1 % EX CREA
1.0000 | TOPICAL_CREAM | Freq: Two times a day (BID) | CUTANEOUS | Status: DC
  Filled 2023-07-02: qty 15

## 2023-07-02 MED ORDER — ACETAMINOPHEN 325 MG PO TABS
650.0000 mg | ORAL_TABLET | Freq: Four times a day (QID) | ORAL | Status: DC | PRN
  Administered 2023-07-03 – 2023-07-07 (×9): 650 mg via ORAL
  Filled 2023-07-02 (×9): qty 2

## 2023-07-02 MED ORDER — INSULIN ASPART 100 UNIT/ML IJ SOLN
0.0000 [IU] | Freq: Every day | INTRAMUSCULAR | Status: DC
  Administered 2023-07-03: 3 [IU] via SUBCUTANEOUS
  Administered 2023-07-04: 2 [IU] via SUBCUTANEOUS
  Administered 2023-07-05: 4 [IU] via SUBCUTANEOUS
  Administered 2023-07-06: 2 [IU] via SUBCUTANEOUS
  Filled 2023-07-02 (×4): qty 1

## 2023-07-02 NOTE — H&P (Incomplete)
History and Physical    Tyler [name] ZOX:[id number] DOB: 21-Feb-1946 DOA: 07/02/2023  Referring MD/NP/PA:   PCP: Elder Negus, NP   Patient coming from:  The patient is coming from home.     Chief Complaint: SOB and worsening leg edema  HPI: Tyler [name] is a 77 y.o. 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  Patient states that he has worsening bilateral leg edema in the past several days.  He developed SOB which has been progressively worsening, particularly on exertion.  Patient has cough with clear mucus production.  He states that he has chronic frontal chest wall pain since childhood after injury, which has not changed.  No fever or chills.  No nausea, vomiting, diarrhea or abdominal pain.  No symptoms of UTI. Pt does not have a fever or chills.  Of note, patient has chronic diabetic foot ulcers.  Patient has been following up with Dr. Ethelene Hal of Podiatry. Pt was seen in his office 06/25/23. Per his clinic note, pt's left foot ulcer is concerning for osteomyelitis.  Patient had CT scan of left foot with pending results. Pt was started on Augmentin on 06/25/23 for 10 days by [name]. Pt has chronic bilateral lower leg erythema.  Patient denies significant pain in legs.  On my examination, patient has warmth over right lower leg.  Data reviewed independently and ED Course: pt was found to have BNP 1066, trop  24,  WBC 10.0, renal function close to baseline, temperature normal, blood pressure 141/78, heart rate 111, 95, RR 15, oxygen saturation 88% on room air, which improved to 98% on 2 L oxygen.  Patient is admitted to telemetry bed as inpatient.  Chest x-ray: 1.  Mild pulmonary vascular congestion with small bilateral pleural effusions. 2. Increasing bibasilar airspace opacities.  EKG: I have personally reviewed.  Paced rhythm, QTc 534   Review of Systems:   General: no fevers, chills, no body weight gain, has fatigue HEENT: no blurry vision, hearing changes or sore throat Respiratory: has dyspnea, coughing, no wheezing CV: has chronic front chest wall pain, no palpitations GI: no nausea, vomiting, abdominal pain, diarrhea, constipation GU: no dysuria, burning on urination, increased urinary frequency, hematuria  Ext: has leg edema Neuro: no unilateral weakness, numbness, or tingling, no vision change or hearing loss Skin: has foot ulcers MSK: No muscle spasm, no deformity, no limitation of range of movement in spin Heme: No easy bruising.  Travel history: No recent long distant travel.   Allergy:  Allergies  Allergen Reactions   Eliquis [Apixaban]     Dizziness  and vision change    Past Medical History:  Diagnosis Date   (HFpEF) heart failure with preserved ejection fraction (HCC) 03/01/2020   a.) TTE 03/01/2020: EF 55-60%, mod MAC, mod AoV sclerosis, triv AR, mild TR, mod MR, RVSP 50-59; b.) TTE 09/10/2020: EF 55-60%, mod MAC, mod AoV sclerosis, mild TR, 3+ MR, RVSP 50-59; c.) TTE 09/26/2020: EF 55-60%, mild LA dil, triv PR, mild MR/TR, RVSP 37-49   Adenoma of left adrenal gland    Anemia    Arthritis    Atrial fibrillation and flutter (HCC)    a.) CHA2DS2VASc = 5 (age x2, HFpEF, HTN, T2DM);  b.) s/p CTI ablation 09/07/2020; c.) rate/rhythm maintained on oral carvedilol; not on chronic anticoagulation therapy   CAD (coronary artery disease)    Cardiomegaly    CKD (chronic kidney disease), stage III (HCC)    DOE (dyspnea on exertion)    Drug-induced bradycardia    Gangrene of toe of left foot (HCC)    a.) 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    HLD  (hyperlipidemia)    Hypertension    Long term current use of aspirin    Lymphedema of both lower extremities    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)    a.) s/p amputation 11/04/2022   Peripheral vascular disease (HCC)    Pleural effusion on right 09/09/2020   a.) s/p RIGHT thoracentesis with 2180 cc yield   Pneumonia    Presence of permanent cardiac pacemaker 09/10/2020   a.) TVP placement 09/10/2020 due to intermittent CHB in setting of urosepsis; b.) s/p PPM placement 09/15/2020: MDT Azure XT DR (SN: [id number])   Pulmonary hypertension (HCC) 03/01/2020   a.) TTE: 03/01/2020: RVSP 50-59; b.) TTE 09/10/2020: RVSP 50-59; c.) TTE 09/26/2020: RVSP 37-49   RA (rheumatoid arthritis) (HCC)    Rheumatic fever    Sepsis (HCC) 09/10/2020   a.) urosepsis --> BC x 2 sets and UC all grew out significant Proteus mirabilis; admitted to [facility] 09/07/2020 - 09/27/2020.   Sick sinus syndrome [facility])    a.) s/p MDT PPM placement 09/15/2020   T2DM (type 2 diabetes mellitus) (HCC)    Urinary retention    chronic, with indwelling Foley catheter and plans for a suprapubic   Wears dentures    full upper    Past Surgical History:  Procedure Laterality Date   AMPUTATION TOE Right 11/04/2022   Procedure: AMPUTATION TOE;  Surgeon: Felecia Shelling, [name];  [name];  Service: Podiatry;  Laterality: Right;   BONE BIOPSY Right 04/05/2023   Procedure: BONE BIOPSY THIRD & FOURTH;  Surgeon: Felecia Shelling, [name];  [name];  Service: Podiatry;  Laterality: Right;   CARDIAC ELECTROPHYSIOLOGY STUDY AND ABLATION N/A 09/07/2020   Procedure: CARDIAC EP STUDY AND ABLATION (CTI)   CATARACT EXTRACTION W/PHACO Right 01/16/2017   Procedure: CATARACT EXTRACTION PHACO AND INTRAOCULAR LENS PLACEMENT (IOC)  Right Complicated;  Surgeon: Lockie Mola, MD;  Location: [facility];  Service: Ophthalmology;  Laterality: Right;  IVA Block Healon 5 malyugin vision blue Diabetic -  oral meds   CATARACT EXTRACTION W/PHACO Left 10/23/2022   Procedure: CATARACT EXTRACTION PHACO AND INTRAOCULAR LENS PLACEMENT (IOC) LEFT DIABETIC;  Surgeon: Galen Manila, MD;  Location: [facility];  Service: Ophthalmology;  Laterality: Left;  Diabetic   COLONOSCOPY     LOWER EXTREMITY ANGIOGRAPHY Right 11/02/2022   Procedure: Lower Extremity Angiography;  Surgeon: Annice Needy, MD;  Location: [facility] INVASIVE CV LAB;  Service: Cardiovascular;  Laterality: Right;   METATARSAL HEAD EXCISION Left 07/05/2018   Procedure: RESECTION FIRST METATARSAL INFECTED BONE AND SOFT TISSUE;  Surgeon: Recardo Evangelist, [name];  [name];  Service: Podiatry;  Laterality: Left;   METATARSAL HEAD EXCISION Right 04/05/2023   Procedure: METATARSAL HEAD EXCISION THIRD & FOURTH;  Surgeon: Felecia Shelling, [name];  [name];  Service: Podiatry;  Laterality: Right;   PACEMAKER INSERTION  09/15/2020   TOE AMPUTATION Left    TONSILLECTOMY      Social History:  reports that  he has never smoked. He has never used smokeless tobacco. He reports that he does not currently use alcohol. He reports that he does not use drugs.  Family History: Reviewed with patient, patient states that both parents deceased from old age.  His niece has cancer, but he is not sure what type of cancer.  Family History  Problem Relation Age of Onset   Cancer Niece      Prior to Admission medications   Medication Sig Start Date End Date Taking? Authorizing Provider  amoxicillin-clavulanate (AUGMENTIN) 875-125 MG tablet Take 1 tablet by mouth 2 (two) times daily. 06/25/23   Felecia Shelling, [name]  aspirin EC 81 MG tablet Take 1 tablet (81 mg total) by mouth daily. Swallow whole. 11/07/22   Sunnie Nielsen, DO  carvedilol (COREG) 6.25 MG tablet Take 1 tablet (6.25 mg total) by mouth 2 (two) times daily. 11/06/22   Sunnie Nielsen, DO  furosemide (LASIX) 40 MG tablet Take 40 mg by mouth daily. 04/30/22   [provider]   gentamicin cream (GARAMYCIN) 0.1 % Apply 1 Application topically 2 (two) times daily. 05/31/23   Felecia Shelling, [name]  glipiZIDE (GLUCOTROL) 10 MG tablet Take 10 mg by mouth 2 (two) times daily. 06/20/18   [provider]  lisinopril (ZESTRIL) 40 MG tablet Take 1 tablet (40 mg total) by mouth daily. 11/07/22   Sunnie Nielsen, DO  metFORMIN (GLUCOPHAGE) 1000 MG tablet Take 1,000 mg by mouth 2 (two) times daily. 06/17/18   [provider]  metolazone (ZAROXOLYN) 2.5 MG tablet Take 2.5 mg by mouth once a week. Monday 05/27/22   [provider]  Multiple Vitamins-Minerals (MULTIVITAMIN WITH MINERALS) tablet Take 1 tablet by mouth daily.    [provider]  OVER THE COUNTER MEDICATION 1 Scoop daily. Super Beets Powder    [provider]  silver sulfADIAZINE (SILVADENE) 1 % cream Apply to affected area daily 06/25/23 06/24/24  Felecia Shelling, [name]    Physical Exam: Vitals:   07/02/23 1600 07/02/23 1630 07/02/23 1730 07/02/23 1900  BP: (!) 164/95 (!) 148/88 (!) 141/78 (!) 140/81  Pulse: (!) 104 100 95 (!) 101  Resp:   15 (!) 26  Temp:      TempSrc:      SpO2: 100% 99% 90% 94%  Weight:      Height:       General: Not in acute distress HEENT:       Eyes: PERRL, EOMI, no jaundice       ENT: No discharge from the ears and nose, no pharynx injection, no tonsillar enlargement.        Neck: Difficult to assess JVD due to obesity, no bruit, no mass felt. Heme: No neck lymph node enlargement. Cardiac: S1/S2, RRR, No murmurs, No gallops or rubs. Respiratory: Has crackles bilaterally GI: Soft, nondistended, nontender, no rebound pain, no organomegaly, BS present. GU: No hematuria Ext: 2+ pitting leg edema bilaterally. 1+DP/PT pulse bilaterally. Musculoskeletal: No joint deformities, No joint redness or warmth, no limitation of ROM in spin. Skin: has foot ulcers in both feet, no active drainage, tenderness or surrounding erythema.  Has chronic erythema in both  lower legs, the right leg is warm on touch.       Neuro: Alert, oriented X3, cranial nerves II-XII grossly intact, moves all extremities normally.  Psych: Patient is not psychotic, no suicidal or hemocidal ideation.  Labs on Admission: I have personally reviewed following labs and imaging studies  CBC: Recent  Labs  Lab 07/02/23 1247  WBC 10.0  HGB 12.0*  HCT 36.8*  MCV 94.4  PLT 239   Basic Metabolic Panel: Recent Labs  Lab 07/02/23 1247  NA 136  K 4.1  CL 102  CO2 24  GLUCOSE 227*  BUN 36*  CREATININE 1.45*  CALCIUM 8.8*   GFR: Estimated Creatinine Clearance: 60.6 mL/min (A) (by C-G formula based on SCr of 1.45 mg/dL (H)). Liver Function Tests: Recent Labs  Lab 07/02/23 1247  AST 27  ALT 26  ALKPHOS 69  BILITOT 1.0  PROT 7.4  ALBUMIN 3.5   No results for input(s): "LIPASE", "AMYLASE" in the last 168 hours. No results for input(s): "AMMONIA" in the last 168 hours. Coagulation Profile: No results for input(s): "INR", "PROTIME" in the last 168 hours. Cardiac Enzymes: No results for input(s): "CKTOTAL", "CKMB", "CKMBINDEX", "TROPONINI" in the last 168 hours. BNP (last 3 results) No results for input(s): "PROBNP" in the last 8760 hours. HbA1C: No results for input(s): "HGBA1C" in the last 72 hours. CBG: No results for input(s): "GLUCAP" in the last 168 hours. Lipid Profile: No results for input(s): "CHOL", "HDL", "LDLCALC", "TRIG", "CHOLHDL", "LDLDIRECT" in the last 72 hours. Thyroid Function Tests: No results for input(s): "TSH", "T4TOTAL", "FREET4", "T3FREE", "THYROIDAB" in the last 72 hours. Anemia Panel: No results for input(s): "VITAMINB12", "FOLATE", "FERRITIN", "TIBC", "IRON", "RETICCTPCT" in the last 72 hours. Urine analysis:    Component Value Date/Time   COLORURINE YELLOW (A) 08/10/2022 1840   APPEARANCEUR HAZY (A) 08/10/2022 1840   APPEARANCEUR Clear 07/05/2014 2040   LABSPEC 1.016 08/10/2022 1840   LABSPEC 1.030 07/05/2014 2040    PHURINE 6.0 08/10/2022 1840   GLUCOSEU NEGATIVE 08/10/2022 1840   GLUCOSEU >=500 07/05/2014 2040   HGBUR NEGATIVE 08/10/2022 1840   BILIRUBINUR NEGATIVE 08/10/2022 1840   BILIRUBINUR Negative 07/05/2014 2040   KETONESUR NEGATIVE 08/10/2022 1840   PROTEINUR NEGATIVE 08/10/2022 1840   NITRITE NEGATIVE 08/10/2022 1840   LEUKOCYTESUR MODERATE (A) 08/10/2022 1840   LEUKOCYTESUR Negative 07/05/2014 2040   Sepsis Labs: @LABRCNTIP (procalcitonin:4,lacticidven:4) )No results found for this or any previous visit (from the past 240 hour(s)).   Radiological Exams on Admission: DG Chest 2 View  Result Date: 07/02/2023 CLINICAL DATA:  Shortness of breath, leg swelling EXAM: CHEST - 2 VIEW COMPARISON:  11/05/2022 FINDINGS: Mild central pulmonary vascular congestion. Patchy airspace opacities in the lung bases left greater than right, slightly increased. Stable left subclavian dual lead transvenous pacemaker. Heart size upper limits normal. Aortic Atherosclerosis (ICD10-170.0). Small bilateral pleural effusions. Vertebral endplate spurring at multiple levels in the lower thoracic spine. IMPRESSION: 1. Mild pulmonary vascular congestion with small bilateral pleural effusions. 2. Increasing bibasilar airspace opacities. Electronically Signed   By: Corlis Leak M.D.   On: 07/02/2023 17:31      Assessment/Plan Principal Problem:   Acute on chronic diastolic CHF (congestive heart failure) (HCC) Active Problems:   Myocardial injury   Persistent atrial fibrillation (HCC)   Essential hypertension   Type II diabetes mellitus with renal manifestations (HCC)   Chronic kidney disease, stage 3a (HCC)   Cellulitis of right lower extremity   Diabetic foot ulcer (HCC)   Obesity (BMI 30-39.9)   Assessment and Plan:   Principal Problem:   Acute on chronic diastolic CHF (congestive heart failure) (HCC) Active Problems:   Myocardial injury   Persistent atrial fibrillation (HCC)   Essential hypertension   Type  II diabetes mellitus with renal manifestations (HCC)   Chronic kidney disease, stage 3a (  HCC)   Cellulitis of right lower extremity   Diabetic foot ulcer (HCC)   Obesity (BMI 30-39.9)    DVT ppx: SQ Lovenox  Code Status: Full code    Family Communication:  I offered to call his family, but the patient states that he will call his family by himself, he does not want me to call his family.  Disposition Plan:  Anticipate discharge back to previous environment  Consults called:  none  Admission status and Level of care: Telemetry Cardiac:   as inpt       Dispo: The patient is from: Home              Anticipated d/c is to: Home              Anticipated d/c date is: 2 days              Patient currently is not medically stable to d/c.    Severity of Illness:  The appropriate patient status for this patient is INPATIENT. Inpatient status is judged to be reasonable and necessary in order to provide the required intensity of service to ensure the patient's safety. [name] presenting symptoms, physical exam findings, and initial radiographic and laboratory data in the context of their chronic comorbidities is felt to place them at high risk for further clinical deterioration. Furthermore, it is not anticipated that the patient will be medically stable for discharge from the hospital within 2 midnights of admission.   * I certify that at the point of admission it is my clinical judgment that the patient will require inpatient hospital care spanning beyond 2 midnights from the point of admission due to high intensity of service, high risk for further deterioration and high frequency of surveillance required.*       Date of Service 07/02/2023    Lorretta Harp [facility]   If 7PM-7AM, please contact night-coverage [email or web address] 07/02/2023, 7:39 PM

## 2023-07-02 NOTE — Progress Notes (Signed)
PHARMACIST - PHYSICIAN COMMUNICATION  CONCERNING:  Enoxaparin (Lovenox) for DVT Prophylaxis    RECOMMENDATION: Patient was prescribed enoxaprin 40mg  q24 hours for VTE prophylaxis.   Filed Weights   07/02/23 1245  Weight: 117.9 kg (260 lb)    Body mass index is 30.05 kg/m.  Estimated Creatinine Clearance: 60.6 mL/min (A) (by C-G formula based on SCr of 1.45 mg/dL (H)).   Based on [facility] policy patient is candidate for enoxaparin 0.5mg /kg TBW SQ every 24 hours based on BMI being >30.  DESCRIPTION: Pharmacy has adjusted enoxaparin dose per [facility] policy.  Patient is now receiving enoxaparin 60 mg every 24 hours    Effie Shy, PharmD PGY1 Pharmacy Resident 07/02/2023 6:25 PM

## 2023-07-02 NOTE — ED Notes (Signed)
Patient transported to X-ray 

## 2023-07-02 NOTE — H&P (Incomplete)
History and Physical    Tyler [name] ZOX:[id number] DOB: April 12, 1946 DOA: 07/02/2023  Referring MD/NP/PA:   PCP: Elder Negus, NP   Patient coming from:  The patient is coming from home.     Chief Complaint: SOB and worsening leg edema  HPI: Tyler [name] is a 76 y.o. 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  Patient states that he has worsening bilateral leg edema in the past several days.  He developed SOB which has been progressively worsening, particularly on exertion.  Patient has cough with clear mucus production.  He states that he has chronic frontal chest wall pain since childhood after injury, which has not changed.  No fever or chills.  No nausea, vomiting, diarrhea or abdominal pain.  No symptoms of UTI. Pt does not have a fever or chills.  Of note, patient has chronic diabetic foot ulcers.  Patient has been following up with Dr. Ethelene Hal of Podiatry. Pt was seen in his office 06/25/23. Per his clinic note, pt's left foot ulcer is concerning for osteomyelitis.  Patient had CT scan of left foot with pending results. Pt was started on Augmentin on 06/25/23 for 10 days by [name]. Pt has chronic bilateral lower leg erythema.  Patient denies significant pain in legs.  On my examination, patient has warmth over right lower leg.  Data reviewed independently and ED Course: pt was found to have BNP 1066, trop  24,  WBC 10.0, renal function close to baseline, temperature normal, blood pressure 141/78, heart rate 111, 95, RR 15, oxygen saturation 88% on room air, which improved to 98% on 2 L oxygen.  Patient is admitted to telemetry bed as inpatient.  Chest x-ray: 1.  Mild pulmonary vascular congestion with small bilateral pleural effusions. 2. Increasing bibasilar airspace opacities.  EKG: I have personally reviewed.  Paced rhythm, QTc 534   Review of Systems:   General: no fevers, chills, no body weight gain, has fatigue HEENT: no blurry vision, hearing changes or sore throat Respiratory: has dyspnea, coughing, no wheezing CV: has chronic front chest wall pain, no palpitations GI: no nausea, vomiting, abdominal pain, diarrhea, constipation GU: no dysuria, burning on urination, increased urinary frequency, hematuria  Ext: has leg edema Neuro: no unilateral weakness, numbness, or tingling, no vision change or hearing loss Skin: has foot ulcers MSK: No muscle spasm, no deformity, no limitation of range of movement in spin Heme: No easy bruising.  Travel history: No recent long distant travel.   Allergy:  Allergies  Allergen Reactions  . Eliquis [Apixaban]     Dizziness  and vision change    Past Medical History:  Diagnosis Date  . (HFpEF) heart failure with preserved ejection fraction (HCC) 03/01/2020   a.) TTE 03/01/2020: EF 55-60%, mod MAC, mod AoV sclerosis, triv AR, mild TR, mod MR, RVSP 50-59; b.) TTE 09/10/2020: EF 55-60%, mod MAC, mod AoV sclerosis, mild TR, 3+ MR, RVSP 50-59; c.) TTE 09/26/2020: EF 55-60%, mild LA dil, triv PR, mild MR/TR, RVSP 37-49  . Adenoma of left adrenal gland   . Anemia   . Arthritis   . Atrial fibrillation and flutter (HCC)    a.) CHA2DS2VASc = 5 (age x2, HFpEF, HTN, T2DM);  b.) s/p CTI ablation 09/07/2020; c.) rate/rhythm maintained on oral carvedilol; not on chronic anticoagulation therapy  . CAD (coronary artery disease)   . Cardiomegaly   . CKD (chronic kidney disease), stage III (HCC)   . DOE (dyspnea on exertion)   . Drug-induced bradycardia   . Gangrene of toe of left foot (HCC)    a.) 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  . Hepatosplenomegaly   . History of bilateral cataract extraction    . HLD (hyperlipidemia)   . Hypertension   . Long term current use of aspirin   . Lymphedema of both lower extremities   . 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 Peripheral vascular disease (HCC)   . Pleural effusion on right 09/09/2020   a.) s/p RIGHT thoracentesis with 2180 cc yield  . Pneumonia   . Presence of permanent cardiac pacemaker 09/10/2020   a.) TVP placement 09/10/2020 due to intermittent CHB in setting of urosepsis; b.) s/p PPM placement 09/15/2020: MDT Azure XT DR (SN: [id number])  . Pulmonary hypertension (HCC) 03/01/2020   a.) TTE: 03/01/2020: RVSP 50-59; b.) TTE 09/10/2020: RVSP 50-59; c.) TTE 09/26/2020: RVSP 37-49  . RA (rheumatoid arthritis) (HCC)   . Rheumatic fever   . Sepsis (HCC) 09/10/2020   a.) urosepsis --> BC x 2 sets and UC all grew out significant Proteus mirabilis; admitted to [facility] 09/07/2020 - 09/27/2020.  . Sick sinus syndrome (HCC)    a.) s/p MDT PPM placement 09/15/2020  . T2DM (type 2 diabetes mellitus) (HCC)   . Urinary retention    chronic, with indwelling Foley catheter and plans for a suprapubic  . Wears dentures    full upper    Past Surgical History:  Procedure Laterality Date  . AMPUTATION TOE Right 11/04/2022   Procedure: AMPUTATION TOE;  Surgeon: Felecia Shelling, [name];  [name];  Service: Podiatry;  Laterality: Right;  . BONE BIOPSY Right 04/05/2023   Procedure: BONE BIOPSY THIRD & FOURTH;  Surgeon: Felecia Shelling, [name];  [name];  Service: Podiatry;  Laterality: Right;  . CARDIAC ELECTROPHYSIOLOGY STUDY AND ABLATION N/A 09/07/2020   Procedure: CARDIAC EP STUDY AND ABLATION (CTI)  . CATARACT EXTRACTION W/PHACO Right 01/16/2017   Procedure: CATARACT EXTRACTION PHACO AND INTRAOCULAR LENS PLACEMENT (IOC)  Right Complicated;  Surgeon: Lockie Mola, MD;  Location: [facility];  Service: Ophthalmology;  Laterality: Right;  IVA Block Healon 5  malyugin vision blue Diabetic - oral meds  . CATARACT EXTRACTION W/PHACO Left 10/23/2022   Procedure: CATARACT EXTRACTION PHACO AND INTRAOCULAR LENS PLACEMENT (IOC) LEFT DIABETIC;  Surgeon: Galen Manila, MD;  Location: [facility];  Service: Ophthalmology;  Laterality: Left;  Diabetic  . COLONOSCOPY    . LOWER EXTREMITY ANGIOGRAPHY Right 11/02/2022   Procedure: Lower Extremity Angiography;  Surgeon: Annice Needy, MD;  Location: [facility];  Service: Cardiovascular;  Laterality: Right;  . METATARSAL HEAD EXCISION Left 07/05/2018   Procedure: RESECTION FIRST METATARSAL INFECTED BONE AND SOFT TISSUE;  Surgeon: Recardo Evangelist, [name];  [name];  Service: Podiatry;  Laterality: Left;  . METATARSAL HEAD EXCISION Right 04/05/2023   Procedure: METATARSAL HEAD EXCISION THIRD & FOURTH;  Surgeon: Felecia Shelling, [name];  [name];  Service: Podiatry;  Laterality: Right;  . PACEMAKER INSERTION  09/15/2020  . TOE AMPUTATION Left   . TONSILLECTOMY      Social History:  reports that  he has never smoked. He has never used smokeless tobacco. He reports that he does not currently use alcohol. He reports that he does not use drugs.  Family History: Reviewed with patient, patient states that both parents deceased from old age.  His niece has cancer, but he is not sure what type of cancer.  Family History  Problem Relation Age of Onset  . Cancer Niece      Prior to Admission medications   Medication Sig Start Date End Date Taking? Authorizing Provider  amoxicillin-clavulanate (AUGMENTIN) 875-125 MG tablet Take 1 tablet by mouth 2 (two) times daily. 06/25/23   Felecia Shelling, [name]  aspirin EC 81 MG tablet Take 1 tablet (81 mg total) by mouth daily. Swallow whole. 11/07/22   Sunnie Nielsen, DO  carvedilol (COREG) 6.25 MG tablet Take 1 tablet (6.25 mg total) by mouth 2 (two) times daily. 11/06/22   Sunnie Nielsen, DO  furosemide (LASIX) 40 MG tablet Take 40 mg by  mouth daily. 04/30/22   [provider]  gentamicin cream (GARAMYCIN) 0.1 % Apply 1 Application topically 2 (two) times daily. 05/31/23   Felecia Shelling, [name]  glipiZIDE (GLUCOTROL) 10 MG tablet Take 10 mg by mouth 2 (two) times daily. 06/20/18   [provider]  lisinopril (ZESTRIL) 40 MG tablet Take 1 tablet (40 mg total) by mouth daily. 11/07/22   Sunnie Nielsen, DO  metFORMIN (GLUCOPHAGE) 1000 MG tablet Take 1,000 mg by mouth 2 (two) times daily. 06/17/18   [provider]  metolazone (ZAROXOLYN) 2.5 MG tablet Take 2.5 mg by mouth once a week. Monday 05/27/22   [provider]  Multiple Vitamins-Minerals (MULTIVITAMIN WITH MINERALS) tablet Take 1 tablet by mouth daily.    [provider]  OVER THE COUNTER MEDICATION 1 Scoop daily. Super Beets Powder    [provider]  silver sulfADIAZINE (SILVADENE) 1 % cream Apply to affected area daily 06/25/23 06/24/24  Felecia Shelling, [name]    Physical Exam: Vitals:   07/02/23 1600 07/02/23 1630 07/02/23 1730 07/02/23 1900  BP: (!) 164/95 (!) 148/88 (!) 141/78 (!) 140/81  Pulse: (!) 104 100 95 (!) 101  Resp:   15 (!) 26  Temp:      TempSrc:      SpO2: 100% 99% 90% 94%  Weight:      Height:       General: Not in acute distress HEENT:       Eyes: PERRL, EOMI, no jaundice       ENT: No discharge from the ears and nose, no pharynx injection, no tonsillar enlargement.        Neck: Difficult to assess JVD due to obesity, no bruit, no mass felt. Heme: No neck lymph node enlargement. Cardiac: S1/S2, RRR, No murmurs, No gallops or rubs. Respiratory: Has crackles bilaterally GI: Soft, nondistended, nontender, no rebound pain, no organomegaly, BS present. GU: No hematuria Ext: 2+ pitting leg edema bilaterally. 1+DP/PT pulse bilaterally. Musculoskeletal: No joint deformities, No joint redness or warmth, no limitation of ROM in spin. Skin: has foot ulcers in both feet, no active drainage, tenderness or  surrounding erythema.  Has chronic erythema in both lower legs, the right leg is warm on touch.       Neuro: Alert, oriented X3, cranial nerves II-XII grossly intact, moves all extremities normally.  Psych: Patient is not psychotic, no suicidal or hemocidal ideation.  Labs on Admission: I have personally reviewed following labs and imaging studies  CBC: Recent  Labs  Lab 07/02/23 1247  WBC 10.0  HGB 12.0*  HCT 36.8*  MCV 94.4  PLT 239   Basic Metabolic Panel: Recent Labs  Lab 07/02/23 1247  NA 136  K 4.1  CL 102  CO2 24  GLUCOSE 227*  BUN 36*  CREATININE 1.45*  CALCIUM 8.8*   GFR: Estimated Creatinine Clearance: 60.6 mL/min (A) (by C-G formula based on SCr of 1.45 mg/dL (H)). Liver Function Tests: Recent Labs  Lab 07/02/23 1247  AST 27  ALT 26  ALKPHOS 69  BILITOT 1.0  PROT 7.4  ALBUMIN 3.5   No results for input(s): "LIPASE", "AMYLASE" in the last 168 hours. No results for input(s): "AMMONIA" in the last 168 hours. Coagulation Profile: No results for input(s): "INR", "PROTIME" in the last 168 hours. Cardiac Enzymes: No results for input(s): "CKTOTAL", "CKMB", "CKMBINDEX", "TROPONINI" in the last 168 hours. BNP (last 3 results) No results for input(s): "PROBNP" in the last 8760 hours. HbA1C: No results for input(s): "HGBA1C" in the last 72 hours. CBG: No results for input(s): "GLUCAP" in the last 168 hours. Lipid Profile: No results for input(s): "CHOL", "HDL", "LDLCALC", "TRIG", "CHOLHDL", "LDLDIRECT" in the last 72 hours. Thyroid Function Tests: No results for input(s): "TSH", "T4TOTAL", "FREET4", "T3FREE", "THYROIDAB" in the last 72 hours. Anemia Panel: No results for input(s): "VITAMINB12", "FOLATE", "FERRITIN", "TIBC", "IRON", "RETICCTPCT" in the last 72 hours. Urine analysis:    Component Value Date/Time   COLORURINE YELLOW (A) 08/10/2022 1840   APPEARANCEUR HAZY (A) 08/10/2022 1840   APPEARANCEUR Clear 07/05/2014 2040   LABSPEC 1.016  08/10/2022 1840   LABSPEC 1.030 07/05/2014 2040   PHURINE 6.0 08/10/2022 1840   GLUCOSEU NEGATIVE 08/10/2022 1840   GLUCOSEU >=500 07/05/2014 2040   HGBUR NEGATIVE 08/10/2022 1840   BILIRUBINUR NEGATIVE 08/10/2022 1840   BILIRUBINUR Negative 07/05/2014 2040   KETONESUR NEGATIVE 08/10/2022 1840   PROTEINUR NEGATIVE 08/10/2022 1840   NITRITE NEGATIVE 08/10/2022 1840   LEUKOCYTESUR MODERATE (A) 08/10/2022 1840   LEUKOCYTESUR Negative 07/05/2014 2040   Sepsis Labs: @LABRCNTIP (procalcitonin:4,lacticidven:4) )No results found for this or any previous visit (from the past 240 hour(s)).   Radiological Exams on Admission: DG Chest 2 View  Result Date: 07/02/2023 CLINICAL DATA:  Shortness of breath, leg swelling EXAM: CHEST - 2 VIEW COMPARISON:  11/05/2022 FINDINGS: Mild central pulmonary vascular congestion. Patchy airspace opacities in the lung bases left greater than right, slightly increased. Stable left subclavian dual lead transvenous pacemaker. Heart size upper limits normal. Aortic Atherosclerosis (ICD10-170.0). Small bilateral pleural effusions. Vertebral endplate spurring at multiple levels in the lower thoracic spine. IMPRESSION: 1. Mild pulmonary vascular congestion with small bilateral pleural effusions. 2. Increasing bibasilar airspace opacities. Electronically Signed   By: Corlis Leak M.D.   On: 07/02/2023 17:31      Assessment/Plan Principal Problem:   Acute on chronic diastolic CHF (congestive heart failure) (HCC) Active Problems:   Myocardial injury   Persistent atrial fibrillation (HCC)   Essential hypertension   Type II diabetes mellitus with renal manifestations (HCC)   Chronic kidney disease, stage 3a (HCC)   Cellulitis of right lower extremity   Diabetic foot ulcer (HCC)   Obesity (BMI 30-39.9)   Assessment and Plan:   Acute on chronic diastolic CHF (congestive heart failure) [facility]): Patient has leg edema, significantly elevated BNP 1066, vascular congestion on  chest x-ray, clinically consistent with CHF exacerbation.  2D echo 09/26/2020 showed EF of 55 to 60%.  -Will admit to tele bed as inpatient -Lasix  40 mg bid by IV (patient received 80 mg of Lasix in ED) -2d echo -Daily weights -strict I/O's -Low salt diet -Fluid restriction -As needed bronchodilators for shortness of breath  Myocardial injury: Troponin level 24 -Follow-up 2D echo -Trend troponin -Aspirin -Check A1c, FLP  Persistent atrial fibrillation [facility]): Patient is not taking anticoagulants.  Heart rate 90-110s -Coreg  Essential hypertension -Patient is on IV Lasix -Coreg -IV hydralazine as needed  Type II diabetes mellitus with renal manifestations [facility]): Recent A1c 6.4, well-controlled.  Patient is taking metformin and glipizide at home -Sliding scale insulin  Chronic kidney disease, stage 3a (HCC): Renal function close to baseline.  Recent baseline creatinine 1.3-1.4.  His creatinine is 1.45, BUN 36, GFR 49 -Follow-up with BMP  Cellulitis of right lower extremity: Patient has chronic bilateral lower leg erythema, but he has warmth in the right lower leg on touch, indicating possible cellulitis. -IV Rocephin -Check ESR and CRP -Blood culture  Diabetic foot ulcer (HCC): Chronic bilateral foot ulcers.  Patient 38 up with with [name], currently on Augmentin     Obesity (BMI 30-39.9)    DVT ppx: SQ Lovenox  Code Status: Full code    Family Communication:  I offered to call his family, but the patient states that he will call his family by himself, he does not want me to call his family.  Disposition Plan:  Anticipate discharge back to previous environment  Consults called:  none  Admission status and Level of care: Telemetry Cardiac:   as inpt       Dispo: The patient is from: Home              Anticipated d/c is to: Home              Anticipated d/c date is: 2 days              Patient currently is not medically stable to d/c.    Severity of  Illness:  The appropriate patient status for this patient is INPATIENT. Inpatient status is judged to be reasonable and necessary in order to provide the required intensity of service to ensure the patient's safety. [name] presenting symptoms, physical exam findings, and initial radiographic and laboratory data in the context of their chronic comorbidities is felt to place them at high risk for further clinical deterioration. Furthermore, it is not anticipated that the patient will be medically stable for discharge from the hospital within 2 midnights of admission.   * I certify that at the point of admission it is my clinical judgment that the patient will require inpatient hospital care spanning beyond 2 midnights from the point of admission due to high intensity of service, high risk for further deterioration and high frequency of surveillance required.*       Date of Service 07/02/2023    Lorretta Harp [facility]   If 7PM-7AM, please contact night-coverage [email or web address] 07/02/2023, 7:39 PM

## 2023-07-02 NOTE — ED Provider Notes (Signed)
[facility] Provider Note    Event Date/Time   First MD Initiated Contact with Patient 07/02/23 1543     (approximate)   History   Chief Complaint Leg Swelling   HPI  Tyler [name] is a 77 y.o. male with past medical history of hypertension, diabetes, atrial fibrillation, CHF, PAD, CKD, and rheumatoid arthritis who presents to the ED complaining of leg swelling.  Patient reports that over the past few days he has been dealing with increasing swelling in both of his legs, denies any associated discomfort.  This has been associated with dyspnea on exertion as well as a dry cough, but he denies any fevers or pain in his chest.  He was initially evaluated at urgent care, referred to the ED for further assessment.  He does state that he was treated for pneumonia about a week and a half ago.     Physical Exam   Triage Vital Signs: ED Triage Vitals [07/02/23 1245]  Encounter Vitals Group     BP (!) 151/90     Systolic BP Percentile      Diastolic BP Percentile      Pulse Rate (!) 111     Resp 17     Temp 98.6 F (37 C)     Temp Source Oral     SpO2 95 %     Weight 260 lb (117.9 kg)     Height 6\' 6"  (1.981 m)     Head Circumference      Peak Flow      Pain Score 0     Pain Loc      Pain Education      Exclude from Growth Chart     Most recent vital signs: Vitals:   07/02/23 1630 07/02/23 1730  BP: (!) 148/88 (!) 141/78  Pulse: 100 95  Resp:  15  Temp:    SpO2: 99% 90%    Constitutional: Alert and oriented. Eyes: Conjunctivae are normal. Head: Atraumatic. Nose: No congestion/rhinnorhea. Mouth/Throat: Mucous membranes are moist.  Cardiovascular: Normal rate, regular rhythm. Grossly normal heart sounds.  2+ radial pulses bilaterally. Respiratory: Normal respiratory effort.  No retractions. Lungs CTAB. Gastrointestinal: Soft and nontender. No distention. Musculoskeletal: 2+ pitting edema to knees bilaterally, no associated tenderness..   Neurologic:  Normal speech and language. No gross focal neurologic deficits are appreciated.    ED Results / Procedures / Treatments   Labs (all labs ordered are listed, but only abnormal results are displayed) Labs Reviewed  CBC - Abnormal; Notable for the following components:      Result Value   RBC 3.90 (*)    Hemoglobin 12.0 (*)    HCT 36.8 (*)    All other components within normal limits  COMPREHENSIVE METABOLIC PANEL - Abnormal; Notable for the following components:   Glucose, Bld 227 (*)    BUN 36 (*)    Creatinine, Ser 1.45 (*)    Calcium 8.8 (*)    GFR, Estimated 49 (*)    All other components within normal limits  BRAIN NATRIURETIC PEPTIDE - Abnormal; Notable for the following components:   B Natriuretic Peptide 1,066.9 (*)    All other components within normal limits  TROPONIN I (HIGH SENSITIVITY) - Abnormal; Notable for the following components:   Troponin I (High Sensitivity) 24 (*)    All other components within normal limits     EKG  ED ECG REPORT I, Chesley Noon, the attending physician, personally viewed  and interpreted this ECG.   Date: 07/02/2023  EKG Time: 16:35  Rate: 96  Rhythm: Ventricular paced rhythm  Axis: LAD  Intervals:nonspecific intraventricular conduction delay  ST&T Change: None  RADIOLOGY Chest x-ray reviewed and interpreted by me with pulmonary edema, no focal infiltrate noted.  PROCEDURES:  Critical Care performed: Yes, see critical care procedure note(s)  .Critical Care  Performed by: Chesley Noon, MD Authorized by: Chesley Noon, MD   Critical care provider statement:    Critical care time (minutes):  30   Critical care time was exclusive of:  Separately billable procedures and treating other patients and teaching time   Critical care was necessary to treat or prevent imminent or life-threatening deterioration of the following conditions:  Respiratory failure   Critical care was time spent personally by me on  the following activities:  Development of treatment plan with patient or surrogate, discussions with consultants, evaluation of patient's response to treatment, examination of patient, ordering and review of laboratory studies, ordering and review of radiographic studies, ordering and performing treatments and interventions, pulse oximetry, re-evaluation of patient's condition and review of old charts   I assumed direction of critical care for this patient from another provider in my specialty: no     Care discussed with: admitting provider      MEDICATIONS ORDERED IN ED: Medications  furosemide (LASIX) injection 80 mg (has no administration in time range)  albuterol (VENTOLIN HFA) 108 (90 Base) MCG/ACT inhaler 2 puff (has no administration in time range)  dextromethorphan-guaiFENesin (MUCINEX DM) 30-600 MG per 12 hr tablet 1 tablet (has no administration in time range)     IMPRESSION / MDM / ASSESSMENT AND PLAN / ED COURSE  I reviewed the triage vital signs and the nursing notes.                              77 y.o. male with past medical history of hypertension, diabetes, atrial fibrillation, CHF, PAD, CKD, and rheumatoid arthritis who presents to the ED complaining of increasing leg swelling with dyspnea on exertion for the past few days.  Patient's presentation is most consistent with acute presentation with potential threat to life or bodily function.  Differential diagnosis includes, but is not limited to, CHF exacerbation, COPD, pneumonia, ACS, PE, DVT, electrolyte abnormality, AKI.  Patient nontoxic-appearing and in no acute distress, vital signs remarkable for mild tachycardia but otherwise reassuring.  He is not in any respiratory distress at rest, does have significant lower extremity edema.  Labs with renal function stable compared to previous, no acute electrolyte abnormality, anemia, or leukocytosis noted.  His BNP is elevated consistent with CHF, chest x-ray and troponin are  pending at this time.  Chest x-ray concerning for pulmonary edema, troponin mildly elevated but low suspicion for ACS or PE at this time and we will trend.  We will treat with IV Lasix, on reassessment patient slightly more tachypneic with oxygen saturations of 88% on room air.  He was placed on 2 L nasal cannula and case discussed with hospitalist for admission.      FINAL CLINICAL IMPRESSION(S) / ED DIAGNOSES   Final diagnoses:  Acute on chronic congestive heart failure, unspecified heart failure type (HCC)  Acute respiratory failure with hypoxia (HCC)     Rx / DC Orders   ED Discharge Orders     None        Note:  This document was prepared using  [employer] and may include unintentional dictation errors.   Chesley Noon, MD 07/02/23 [phone]

## 2023-07-02 NOTE — ED Triage Notes (Signed)
Pt sts that he has been having swelling of both legs for the last couple of days, however he sts that he has been having congestion also.

## 2023-07-03 ENCOUNTER — Inpatient Hospital Stay: Payer: BC Managed Care – PPO

## 2023-07-03 ENCOUNTER — Inpatient Hospital Stay
Admit: 2023-07-03 | Discharge: 2023-07-03 | Disposition: A | Discharge status: Home / Self Care | Payer: BC Managed Care – PPO | Attending: Internal Medicine | Admitting: Internal Medicine

## 2023-07-03 ENCOUNTER — Other Ambulatory Visit (HOSPITAL_COMMUNITY): Payer: Self-pay

## 2023-07-03 DIAGNOSIS — I5033 Acute on chronic diastolic (congestive) heart failure: Secondary | ICD-10-CM | POA: Diagnosis not present

## 2023-07-03 LAB — GLUCOSE, CAPILLARY
Glucose-Capillary: 173 mg/dL — ABNORMAL HIGH (ref 70–99)
Glucose-Capillary: 261 mg/dL — ABNORMAL HIGH (ref 70–99)

## 2023-07-03 LAB — BASIC METABOLIC PANEL
Anion gap: 13 (ref 5–15)
BUN: 33 mg/dL — ABNORMAL HIGH (ref 8–23)
CO2: 21 mmol/L — ABNORMAL LOW (ref 22–32)
Calcium: 8.5 mg/dL — ABNORMAL LOW (ref 8.9–10.3)
Chloride: 103 mmol/L (ref 98–111)
Creatinine, Ser: 1.45 mg/dL — ABNORMAL HIGH (ref 0.61–1.24)
GFR, Estimated: 49 mL/min — ABNORMAL LOW (ref >60)
Glucose, Bld: 184 mg/dL — ABNORMAL HIGH (ref 70–99)
Potassium: 4.1 mmol/L (ref 3.5–5.1)
Sodium: 137 mmol/L (ref 135–145)

## 2023-07-03 LAB — HEMOGLOBIN A1C
Hgb A1c MFr Bld: 7.2 % — ABNORMAL HIGH (ref 4.8–5.6)
Mean Plasma Glucose: 159.94 mg/dL

## 2023-07-03 LAB — CBC
HCT: 33.8 % — ABNORMAL LOW (ref 39.0–52.0)
Hemoglobin: 10.9 g/dL — ABNORMAL LOW (ref 13.0–17.0)
MCH: 30.3 pg (ref 26.0–34.0)
MCHC: 32.2 g/dL (ref 30.0–36.0)
MCV: 93.9 fL (ref 80.0–100.0)
Platelets: 219 10*3/uL (ref 150–400)
RBC: 3.6 MIL/uL — ABNORMAL LOW (ref 4.22–5.81)
RDW: 14.6 % (ref 11.5–15.5)
WBC: 7.9 10*3/uL (ref 4.0–10.5)
nRBC: 0 % (ref 0.0–0.2)

## 2023-07-03 LAB — LIPID PANEL
Cholesterol: 148 mg/dL (ref 0–200)
HDL: 31 mg/dL — ABNORMAL LOW (ref >40)
LDL Cholesterol: 95 mg/dL (ref 0–99)
Total CHOL/HDL Ratio: 4.8 {ratio}
Triglycerides: 111 mg/dL (ref <150)
VLDL: 22 mg/dL (ref 0–40)

## 2023-07-03 LAB — MAGNESIUM: Magnesium: 2.4 mg/dL (ref 1.7–2.4)

## 2023-07-03 LAB — C-REACTIVE PROTEIN: CRP: 1 mg/dL — ABNORMAL HIGH (ref <1.0)

## 2023-07-03 LAB — TROPONIN I (HIGH SENSITIVITY): Troponin I (High Sensitivity): 27 ng/L — ABNORMAL HIGH (ref <18)

## 2023-07-03 MED ORDER — ALBUTEROL SULFATE (2.5 MG/3ML) 0.083% IN NEBU
INHALATION_SOLUTION | RESPIRATORY_TRACT | Status: AC
  Filled 2023-07-03: qty 3

## 2023-07-03 MED ORDER — CHLORHEXIDINE GLUCONATE CLOTH 2 % EX PADS
6.0000 | MEDICATED_PAD | Freq: Every day | CUTANEOUS | Status: DC
  Administered 2023-07-03 – 2023-07-07 (×5): 6 via TOPICAL

## 2023-07-03 MED ORDER — GENTAMICIN SULFATE 0.1 % EX CREA
1.0000 | TOPICAL_CREAM | CUTANEOUS | Status: DC
  Administered 2023-07-05 – 2023-07-06 (×2): 1 via TOPICAL
  Filled 2023-07-03 (×2): qty 15

## 2023-07-03 MED ORDER — SILVER SULFADIAZINE 1 % EX CREA
TOPICAL_CREAM | CUTANEOUS | Status: DC
  Filled 2023-07-03: qty 20
  Filled 2023-07-03: qty 85

## 2023-07-03 NOTE — Progress Notes (Incomplete)
[facility] HF Stewardship  PCP: Elder Negus, NP  PCP-Cardiologist: None  HPI: Tyler [name] is a 77 y.o. male with HTN, HLD, DM, PVD, SSS (s/p of PPM), dCHF, CKD-3A, obesity, rheumatic fever, atrial fibrillation not on anticoagulants, chronic diabetic foot ulcers, s/p third metatarsal head resection with bone biopsy right foot who presented with shortness of breath and lower extremity edema. Troponin on admission was 24. BNP on admission was elevated at 1066.9. CXR on admission showed mild pulmonary vascular congestion  Pertinent cardiac history: Long term holter in 03/2020 showed 6.1% PVC burden and multiple AF episodes. Ablation performed and pacemaker placed in 08/2020. Stress test 03/2023 showed no evidence of ischemia and LVEF of 57%. TTE in 08/2020 and 09/2020 showed LVEF of 55-60%.   Pertinent Lab Values: Creatinine  Date Value Ref Range Status  07/10/2014 1.26 0.60 - 1.30 mg/dL Final   Creatinine, Ser  Date Value Ref Range Status  07/03/2023 1.45 (H) 0.61 - 1.24 mg/dL Final   BUN  Date Value Ref Range Status  07/03/2023 33 (H) 8 - 23 mg/dL Final  16/06/9603 10 7 - 18 mg/dL Final   Potassium  Date Value Ref Range Status  07/03/2023 4.1 3.5 - 5.1 mmol/L Final  07/10/2014 3.7 3.5 - 5.1 mmol/L Final   Sodium  Date Value Ref Range Status  07/03/2023 137 135 - 145 mmol/L Final  07/10/2014 142 136 - 145 mmol/L Final   B Natriuretic Peptide  Date Value Ref Range Status  07/02/2023 1,066.9 (H) 0.0 - 100.0 pg/mL Final    Comment:    Performed at [facility], [address] [place], [place] [address]   Magnesium  Date Value Ref Range Status  07/03/2023 2.4 1.7 - 2.4 mg/dL Final    Comment:    Performed at [facility], [address] [place], [place] [address]  07/10/2014 1.8 mg/dL Final    Comment:    7.8-2.9 THERAPEUTIC RANGE: 4-7 mg/dL TOXIC: > 10 mg/dL  -----------------------    Hemoglobin A1C  Date Value Ref Range Status  07/05/2014  10.4 (H) 4.2 - 6.3 % Final    Comment:    The American Diabetes Association recommends that a primary goal of therapy should be <7% and that physicians should reevaluate the treatment regimen in patients with HbA1c values consistently >8%. Hemoglobin A1c - *** THIS IS A CORRECTED REPORT ***  - PLEASE DISREGARD PREVIOUS RESULT.  - Corrected report called to and read back  - by Crista Elliot on 1C @ 1155 07/06/14.  - BGB    Hgb A1c MFr Bld  Date Value Ref Range Status  10/30/2022 6.4 (H) 4.8 - 5.6 % Final    Comment:    (NOTE) Pre diabetes:          5.7%-6.4%  Diabetes:              >6.4%  Glycemic control for   <7.0% adults with diabetes    TSH  Date Value Ref Range Status  06/28/2020 1.726 0.350 - 4.500 uIU/mL Final    Comment:    Performed by a 3rd Generation assay with a functional sensitivity of <=0.01 uIU/mL. Performed at [facility], [address] [place], [place] [address]     Vital Signs: Temp:  [98.4 F (36.9 C)-98.6 F (37 C)] 98.4 F (36.9 C) (10/09 0630) Pulse Rate:  [90-111] 95 (10/09 0630) Cardiac Rhythm: Ventricular paced (10/08 1907) Resp:  [14-26] 18 (10/09 0630) BP: (121-164)/(67-95) 133/90 (10/09 0630)  SpO2:  [90 %-100 %] 95 % (10/09 0630) Weight:  [117.9 kg (260 lb)] 117.9 kg (260 lb) (10/08 1245)   Intake/Output Summary (Last 24 hours) at 07/03/2023 0737 Last data filed at 07/03/2023 0009 Gross per 24 hour  Intake 100 ml  Output 2700 ml  Net -2600 ml    Current Inpatient HFpEF Medications:  -Furosemide 40 mg IV BID Other vasoactive medications include carvedilol 6.25 mg BID for AF  Prior to admission HF Medications:  -Furosemide 40 mg daily and metolazone 2.5 mg on Mondays Reported not taking lisinopril or carvedilol  Assessment: 1. 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 NYHA class *** symptoms.  -Symptoms: -Volume: -Hemodynamics: -MRA: -SGLT2i: -ARNI:   Plan: 1) Medication changes recommended at this  time:              2) Patient assistance:                                                                                        3) Education: -To be completed prior to discharge.  ***  Medication Assistance / Insurance Benefits Check:  Does the patient have prescription insurance?    Type of insurance plan:   Does the patient qualify for medication assistance through manufacturers or grants? {CHL AMB Yes/No/Pending:[id number]}   Eligible grants and/or patient assistance programs: ***   Medication assistance applications in progress: ***   Medication assistance applications approved: ***  Approved medication assistance renewals will be completed by: ***  Outpatient Pharmacy:  Prior to admission outpatient pharmacy: ***        ***

## 2023-07-03 NOTE — TOC Benefit Eligibility Note (Signed)
Patient Product/process development scientist completed.    The patient is insured through [employer] [facility] Patient has [employer], may use a copay card, and/or apply for patient assistance if available.    Ran test claim for Farxiga 10 mg and the current 30 day co-pay is $0.00.  Ran test claim for Jardiance 10 mg and the current 30 day co-pay is $0.00.  This test claim was processed through [facility]- copay amounts may vary at other pharmacies due to pharmacy/plan contracts, or as the patient moves through the different stages of their insurance plan.     Roland Earl, CPHT Pharmacy Technician III Certified Patient Advocate [facility] Pharmacy Patient Advocate Team Direct Number: [phone]  Fax: [phone]

## 2023-07-03 NOTE — Progress Notes (Addendum)
CROSS COVER NOTE  NAME: Tyler [name] MRN: [id number] DOB : 11/26/1945    Concern as stated by nurse / staff   Message from nurse patient was admitted with chronic foley catheter Last changed 02/11/2023 here in ED. Patient denies catheter has been changed elsewhere outpatient     Pertinent findings on chart review: Patient with history on chronic urinary retention requiring management with foley catheter  Assessment and  Interventions   Assessment:  Plan: Change foley and leave in place Obtain sample of urine with foley insertion for UA       Donnie Mesa NP [facility] Cross Cover 7pm-7am - check amion for availability Pager [phone]

## 2023-07-03 NOTE — [place] FL2 (Signed)
[place] MEDICAID FL2 LEVEL OF CARE FORM     IDENTIFICATION  Patient Name: Tyler [name] Birthdate: [birth year] Sex: male Admission Date (Current Location): 07/02/2023  [facility] and [place] Number:  Chiropodist and Address:  [facility], [address], [place], [place] [address]      Provider Number: [id number]  Attending Physician Name and Address:  Loyce Dys, MD  Relative Name and Phone Number:  Patient address [address]  [place] [address]    Current Level of Care: Hospital Recommended Level of Care: Skilled Nursing Facility Prior Approval Number:    Date Approved/Denied:   PASRR Number: [id number]  Discharge Plan: SNF    Current Diagnoses: Patient Active Problem List   Diagnosis Date Noted   Acute on chronic diastolic CHF (congestive heart failure) (HCC) 07/02/2023   Diabetic foot ulcer (HCC) 07/02/2023   Type II diabetes mellitus with renal manifestations (HCC) 07/02/2023   Chronic kidney disease, stage 3a (HCC) 07/02/2023   Myocardial injury 07/02/2023   Cellulitis of right lower extremity 07/02/2023   Epistaxis 11/05/2022   Foot infection 11/03/2022   Cellulitis of right foot 10/31/2022   Type 2 diabetes mellitus with complication, without long-term current use of insulin (HCC) 10/31/2022   Persistent atrial fibrillation (HCC) 10/31/2022   Essential hypertension 10/31/2022   CKD (chronic kidney disease), stage III (HCC) 10/31/2022   Obesity (BMI 30-39.9) 10/31/2022   Gangrene of toe of right foot (HCC) 10/31/2022   PAD (peripheral artery disease) (HCC) 10/31/2022   Right foot infection 10/30/2022   Syncope and collapse 06/28/2020   Acute CHF (congestive heart failure) (HCC) 06/28/2020   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   Diabetes mellitus without complication (HCC)    Sepsis (HCC) 07/03/2018    Orientation RESPIRATION BLADDER Height & Weight     Self, Time, Situation  O2 (2 Liters per nasal  cannula) Indwelling catheter Weight: 117.9 kg Height:  6\' 6"  (198.1 cm)  BEHAVIORAL SYMPTOMS/MOOD NEUROLOGICAL BOWEL NUTRITION STATUS  Other (Comment) (n/a)  (n/a) Continent Diet (2 gram)  AMBULATORY STATUS COMMUNICATION OF NEEDS Skin   Limited Assist Verbally Other (Comment) (Chronic bilateral foot ulcers,bilateral lower leg erythema)                       Personal Care Assistance Level of Assistance  Bathing, Dressing Bathing Assistance: Limited assistance   Dressing Assistance: Limited assistance     Functional Limitations Info             SPECIAL CARE FACTORS FREQUENCY  PT (By licensed PT), OT (By licensed OT)     PT Frequency: Min 2x weekly OT Frequency: Min 2x weekly            Contractures Contractures Info: Not present    Additional Factors Info  Code Status, Allergies Code Status Info: FULL Allergies Info: Eliquis (Apixaban)           Current Medications (07/03/2023):  This is the current hospital active medication list Current Facility-Administered Medications  Medication Dose Route Frequency Provider Last Rate Last Admin   acetaminophen (TYLENOL) tablet 650 mg  650 mg Oral Q6H PRN Lorretta Harp, MD   650 mg at 07/03/23 1350   albuterol (PROVENTIL) (2.5 MG/3ML) 0.083% nebulizer solution 2.5 mg  2.5 mg Inhalation Q4H PRN Lorretta Harp, MD       aspirin EC tablet 81 mg  81 mg Oral Daily Lorretta Harp, MD   81 mg at 07/03/23  7253   carvedilol (COREG) tablet 6.25 mg  6.25 mg Oral BID WC Lorretta Harp, MD   6.25 mg at 07/03/23 6644   cefTRIAXone (ROCEPHIN) 2 g in sodium chloride 0.9 % 100 mL IVPB  2 g Intravenous Q24H Lorretta Harp, MD   Stopped at 07/02/23 2145   dextromethorphan-guaiFENesin (MUCINEX DM) 30-600 MG per 12 hr tablet 1 tablet  1 tablet Oral BID PRN Lorretta Harp, MD       enoxaparin (LOVENOX) injection 60 mg  60 mg Subcutaneous Q24H Lorretta Harp, MD   60 mg at 07/02/23 1847   furosemide (LASIX) injection 40 mg  40 mg Intravenous Willette Pa, MD   40 mg at  07/03/23 0921   [START ON 07/04/2023] gentamicin cream (GARAMYCIN) 0.1 % 1 Application  1 Application Topical 1 day or 1 dose Rosezetta Schlatter T, MD       hydrALAZINE (APRESOLINE) injection 5 mg  5 mg Intravenous Q2H PRN Lorretta Harp, MD       insulin aspart (novoLOG) injection 0-5 Units  0-5 Units Subcutaneous QHS Lorretta Harp, MD       insulin aspart (novoLOG) injection 0-9 Units  0-9 Units Subcutaneous TID WC Lorretta Harp, MD   2 Units at 07/03/23 0347   multivitamin with minerals tablet 1 tablet  1 tablet Oral Daily Lorretta Harp, MD   1 tablet at 07/03/23 0921   ondansetron (ZOFRAN) injection 4 mg  4 mg Intravenous Q8H PRN Lorretta Harp, MD       silver sulfADIAZINE (SILVADENE) 1 % cream   Topical 1 day or 1 dose Loyce Dys, MD         Discharge Medications: Please see discharge summary for a list of discharge medications.  Relevant Imaging Results:  Relevant Lab Results:   Additional Information SSN# [id number]  Truddie Hidden, RN

## 2023-07-03 NOTE — ED Notes (Signed)
BMP redraw, calcium level needs to be reconfirmed.

## 2023-07-03 NOTE — Evaluation (Signed)
Physical Therapy Evaluation Patient Details Name: Tyler [name] MRN: [id number] DOB: Oct 13, 1945 Today's Date: 07/03/2023  History of Present Illness  Patient  77 y.o. 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 Current MD assessment: Acute on chronic CHF exacerbation, cellulitis of RLE, and diabetic foot ulcers.  Clinical Impression  Pt was pleasant and motivated to participate during the session and put forth good effort throughout. Pt found seated EOB upon entry. Pt needing CGA for STS transfers, ascent overall steady, but unable to perform without non-compliance of LLE NWB, despite cues and education for precautions. Pt additionally having poor eccentric control when attempting to sit back down while only utilizing RLE. Once standing, pt unable to shuffle or take functional steps while maintaining NWB on LLE. Pt will benefit from continued PT services upon discharge to safely address deficits listed in patient problem list for decreased caregiver assistance and eventual return to PLOF.       If plan is discharge home, recommend the following: A lot of help with walking and/or transfers;A little help with bathing/dressing/bathroom;Assist for transportation;Help with stairs or ramp for entrance   Can travel by private vehicle   No    Equipment Recommendations Other (comment) (TBD at next venue of care)  Recommendations for Other Services       Functional Status Assessment Patient has had a recent decline in their functional status and demonstrates the ability to make significant improvements in function in a reasonable and predictable amount of time.     Precautions /  Restrictions Precautions Precautions: Fall Restrictions Weight Bearing Restrictions: Yes LLE Weight Bearing: Non weight bearing      Mobility  Bed Mobility               General bed mobility comments: pt seated EOB at start/end of session    Transfers Overall transfer level: Needs assistance Equipment used: Rolling walker (2 wheels) (Tall RW) Transfers: Sit to/from Stand Sit to Stand: Contact guard assist           General transfer comment: poor eccentric control noted when attempting to sit back down. Depsite multi-modal cues pt will likely be non-compliant with NWB precautions when performing STS transfers    Ambulation/Gait               General Gait Details: Pt able to stand up, but unable to shuffle or functionall move RLE while maintaining NWB percautions on LLE.  Stairs            Wheelchair Mobility     Tilt Bed    Modified Rankin (Stroke Patients Only)       Balance Overall balance assessment: Needs assistance   Sitting balance-Leahy Scale: Normal       Standing balance-Leahy Scale: Poor Standing balance comment: Requires heavy UE support to maintain balance while also mainting NWB                             Pertinent Vitals/Pain Pain Assessment Pain Assessment: Faces Faces Pain Scale: No hurt    Home Living Family/patient expects to be discharged to:: Private residence Living Arrangements: Other (Comment) (Roommate) Available Help at Discharge:  (Pt endorses  no support at home, daughter may be able to help) Type of Home: House (room set-up above a garage, flight of stairs to enter) Home Access: Stairs to enter Entrance Stairs-Rails: Left (Left hand rail, able to suport self on wall on R side, can reach both at the same time) Entrance Stairs-Number of Steps: flight (10)   Home Layout: One level Home Equipment: Grab bars - tub/shower;Rolling Walker (2 wheels) (Tall RW) Additional Comments: Pt mentions  possibility of staying with daughter, but would not have assistance or supervision throughout the day if he here to stay there    Prior Function Prior Level of Function : Independent/Modified Independent             Mobility Comments: amb with no AD community distances ADLs Comments: works at [employer] 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; cook/clean/drive     Extremity/Trunk Assessment   Upper Extremity Assessment Upper Extremity Assessment: Overall WFL for tasks assessed    Lower Extremity Assessment Lower Extremity Assessment: LLE deficits/detail;Overall WFL for tasks assessed LLE Deficits / Details: NWB,       Communication   Communication Communication: No apparent difficulties  Cognition Arousal: Alert Behavior During Therapy: WFL for tasks assessed/performed Overall Cognitive Status: Within Functional Limits for tasks assessed                                          General Comments      Exercises     Assessment/Plan    PT Assessment Patient needs continued PT services  PT Problem List Decreased strength;Decreased coordination;Decreased range of motion;Decreased activity tolerance;Decreased balance;Decreased mobility       PT Treatment Interventions DME instruction;Balance training;Gait training;Stair training;Functional mobility training;Therapeutic activities;Therapeutic exercise    PT Goals (Current goals can be found in the Care Plan section)  Acute Rehab PT Goals Patient Stated Goal: get back to work PT Goal Formulation: With patient Time For Goal Achievement: 07/16/23 Potential to Achieve Goals: Good    Frequency Min 1X/week     Co-evaluation               AM-PAC PT "6 Clicks" Mobility  Outcome Measure Help needed turning from your back to your side while in a flat bed without using bedrails?: None Help needed moving from lying on your back to sitting on the side of a flat bed without using bedrails?: None Help needed moving to and  from a bed to a chair (including a wheelchair)?: A Lot Help needed standing up from a chair using your arms (e.g., wheelchair or bedside chair)?: A Lot Help needed to walk in hospital room?: A Lot Help needed climbing 3-5 steps with a railing? : A Lot 6 Click Score: 16    End of Session Equipment Utilized During Treatment: Gait belt Activity Tolerance: Patient tolerated treatment well (limited by non-compliance with NWB status) Patient left: in bed;with call bell/phone within reach (seated EOB) Nurse Communication: Mobility status PT Visit Diagnosis: Other abnormalities of gait and mobility (R26.89);Difficulty in walking, not elsewhere classified (R26.2);Unsteadiness on feet (R26.81)    Time: 0102-7253 PT Time Calculation (min) (ACUTE ONLY): 20 min   Charges:                 Cecile Sheerer, SPT 07/03/23, 3:46 PM

## 2023-07-03 NOTE — Progress Notes (Signed)
Progress Note   Patient: Tyler [name] VHQ:[id number] DOB: 1946-05-26 DOA: 07/02/2023     1 DOS: the patient was seen and examined on 07/03/2023    Subjective:  Patient seen and examined at bedside this morning In some respiratory distress requiring 2 L of intranasal oxygen He tells me his respiratory function is improving Denies chest pain nausea vomiting or abdominal pain   Brief hospital course: Tyler [name] is a 77 y.o. 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  Patient has been having worsening bilateral leg edema in the past several days with associated shortness of breath that has progressively worsened. He has chronic diabetic foot ulcers and follows up with [name] podiatry. On arrival patient was found to have cellulitis involving the right lower extremity.  In the setting of elevated BNP of 1066.  Patient therefore admitted under hospitalist service for management of cellulitis as well as acute on chronic diastolic heart failure  Assessment and Plan:  Acute on chronic diastolic CHF (congestive heart failure) [facility]): Patient has leg edema, significantly elevated BNP 1066, vascular congestion on chest x-ray, clinically consistent with CHF exacerbation.  2D echo 09/26/2020 showed EF of 55 to 60%. Continue IV diuresis with IV Lasix 40 mg twice daily Follow-up on repeat echocardiogram report Continue input and output monitoring Low-salt diet Fluid restriction to 1.5 L/day Daily weighing   Myocardial injury:  -Follow-up 2D echo -Trend troponin -Aspirin -Follow-up on check A1c, FLP   Persistent atrial fibrillation [facility]):  Patient is not taking  anticoagulants.  Heart rate 90-110s Continue Coreg   Essential hypertension Continue Lasix and Coreg Continue blood pressure monitoring as well as as needed hydralazine   Type II diabetes mellitus with renal manifestations (HCC):  Recent A1c 6.4, well-controlled.   Patient is taking metformin and glipizide at home -Sliding scale insulin   Chronic kidney disease, stage 3a (HCC):  Renal function close to baseline.   Recent baseline creatinine 1.3-1.4.   His creatinine is 1.45, BUN 36, GFR 49 Continue to monitor renal function closely   Cellulitis of right lower extremity:  Patient has chronic bilateral lower leg erythema, but he has warmth in the right lower leg on touch, indicating possible cellulitis. Continue ceftriaxone Follow-up ESR and CRP Follow-up blood culture Doppler ultrasound did not show any evidence of DVT   Diabetic foot ulcer (HCC): Chronic bilateral foot ulcers.  Patient following up with with [name] of podiatry, currently on Augmentin.  Just had CT of left foot with pending results Continue ceftriaxone Patient follows up with [name] of for podiatry Wound care on board we appreciate input Continue PT OT  Obesity (BMI 30-39.9): Body weight 117.9 kg, BMI 30.05 -Encourage losing weight -Exercise and healthy diet      DVT ppx: SQ Lovenox   Code Status: Full code     Consults called:  none   Data Reviewed: I have reviewed the patient's previous medical records briefly, I have also reviewed admitting provider documentation and discussed the case.  I have also reviewed the patient's venous ultrasound of the lower extremity that did not show any evidence of clots/DVT, I have reviewed patient's lab results including CBC, CMP as noted below, I have  also reviewed patient's chest x-ray that showed findings of mild pulmonary vascular congestion.  Family Communication: No family at bedside  Disposition: Status is: Inpatient   Planned Discharge Destination:  Skilled nursing facility  Time spent: 55 minutes  Physical Exam: General: Elderly male sitting up in bed in mild acute distress on intranasal  Heme: No neck lymph node enlargement. Cardiac: S1/S2, RRR, No murmurs, No gallops or rubs. Respiratory: Bilateral basal crackles GI: Soft, nondistended, nontender Ext: 2+ pitting leg edema bilaterally. 1+DP/PT pulse bilaterally. Musculoskeletal: Erythema noted to be more pronounced on the right lower extremity below the knee CNS: Alert and oriented  Vitals:   07/03/23 1000 07/03/23 1200 07/03/23 1300 07/03/23 1353  BP: 121/71 121/73 126/77   Pulse: 96 72 91   Resp: (!) 34 (!) 24 (!) 35   Temp:    98.4 F (36.9 C)  TempSrc:    Oral  SpO2: 94% 95% 95%   Weight:      Height:          Latest Ref Rng & Units 07/03/2023    4:38 AM 07/02/2023   12:47 PM 04/06/2023    8:04 AM  CBC  WBC 4.0 - 10.5 K/uL 7.9  10.0  10.0   Hemoglobin 13.0 - 17.0 g/dL 44.0  10.2  72.5   Hematocrit 39.0 - 52.0 % 33.8  36.8  39.1   Platelets 150 - 400 K/uL 219  239  160        Latest Ref Rng & Units 07/03/2023    4:38 AM 07/02/2023   12:47 PM 04/06/2023    8:04 AM  CMP  Glucose 70 - 99 mg/dL 366  440  347   BUN 8 - 23 mg/dL 33  36  28   Creatinine 0.61 - 1.24 mg/dL 4.25  9.56  3.87   Sodium 135 - 145 mmol/L 137  136  136   Potassium 3.5 - 5.1 mmol/L 4.1  4.1  4.5   Chloride 98 - 111 mmol/L 103  102  105   CO2 22 - 32 mmol/L 21  24  22    Calcium 8.9 - 10.3 mg/dL 8.5  8.8  9.0   Total Protein 6.5 - 8.1 g/dL  7.4  7.1   Total Bilirubin 0.3 - 1.2 mg/dL  1.0  0.7   Alkaline Phos 38 - 126 U/L  69  65   AST 15 - 41 U/L  27  16   ALT 0 - 44 U/L  26  14       Author: Loyce Dys, MD 07/03/2023 3:09 PM  For on call review [email or web address].

## 2023-07-03 NOTE — Consult Note (Addendum)
WOC Nurse Consult Note: Reason for Consult: Consult requested to provide topical treatment recommendations for for bilt foot wounds.  Performed remotely after review of progress notes and photos in the EMR.   CT results are pending to R/O osteomyelitis.  Pt has been followed as an outpatient by Dr Logan Bores of the podiatry team.  He was recently seen in the office on 10/1 for a wound assessment. Left plantar middle foot with chronic full thickness wound, red and moist Right plantar great toe wound is yellow and moist Left anterior calf with healing full thickness wounds, red and moist.  Their team has ordered Silvadene to left anterior calf healing full thickness wounds, and Gentamicin cream to left foot wound.  Topical treatment orders provided for bedside nurses to perform the previous plan of care:  1. Apply Silvadene to left leg wounds and right foot wound Q day, then cover with foam dressings.  Change foam dressings Q 3 days or PRN soiling 2. Apply Gentamicin cream Q day to left foot wound and cover with foam dressing.  Change foam dressing Q 3 days or PRN soiling. Please refer to podiatry team for further plan of care.  Please re-consult if further assistance is needed.  Thank-you,  Cammie Mcgee MSN, RN, CWOCN, [place], CNS [phone]

## 2023-07-03 NOTE — Evaluation (Addendum)
Occupational Therapy Evaluation Patient Details Name: Tyler [name] MRN: [id number] DOB: May 21, 1946 Today's Date: 07/03/2023   History of Present Illness Patient  77 y.o. 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 Current MD assessment: Acute on chronic CHF exacerbation, cellulitis of RLE, and diabetic foot ulcers.   Clinical Impression   Pt was seen for OT evaluation this date. Prior to hospital admission, pt was living independently, working ~25 hours weekly. Pt lives alone, in above garage apartment with roughly 10 steps to enter. Pt was alert and oriented during session. Pt presents to acute OT demonstrating impaired ADL performance and functional mobility (See OT problem list for additional functional deficits). Pt currently requires supervision when completing bed mobility. Pt requires CGA + RW and MAX multimodal cues with poor adherence to NWBing precautions during STS CGA + RW. Vss throughout. Verbalized to nurse about uncovered, open wounds on his LE. Pt would benefit from skilled OT services to address noted impairments and functional limitations (see below for any additional details) in order to maximize safety and independence while minimizing falls risk and caregiver burden. OT will follow acutely.       If plan is discharge home, recommend the following: A lot of help with walking and/or transfers;Assist for transportation;Help with stairs or ramp for entrance;Assistance with cooking/housework    Functional Status Assessment  Patient has had a recent decline in their functional status and demonstrates the ability to make significant improvements in function in a  reasonable and predictable amount of time.  Equipment Recommendations  Other (comment) (next venue)    Recommendations for Other Services       Precautions / Restrictions Precautions Precautions: Fall Restrictions Weight Bearing Restrictions: Yes LLE Weight Bearing: Non weight bearing      Mobility Bed Mobility Overal bed mobility: Needs Assistance Bed Mobility: Sit to Supine       Sit to supine: Supervision        Transfers Overall transfer level: Needs assistance Equipment used: Rolling walker (2 wheels) Transfers: Sit to/from Stand Sit to Stand: Contact guard assist           General transfer comment: Non compliant to NWBing precautions when performing STS t/f      Balance Overall balance assessment: Needs assistance Sitting-balance support: Feet supported Sitting balance-Leahy Scale: Normal     Standing balance support: No upper extremity supported Standing balance-Leahy Scale: Fair Standing balance comment: Pt stood without UE support, was able to maintain balance dispite verbal cues to no put weight through L LE.                           ADL either performed or assessed with clinical judgement   ADL Overall ADL's : Needs assistance/impaired Eating/Feeding: Supervision/ safety Eating/Feeding Details (indicate cue type and reason): Eating lunch seated on EOB on arrival to room                 Lower Body Dressing: Supervision for for doffing sock, MAX A to donn  Lower Body Dressing Details (indicate cue type and reason): Increased time ; donning socks seated on EOB  Functional mobility during ADLs: CGA;Cueing for safety;Rolling walker (2 wheels), limited to STS on this date due to poor adherence to NWBing precautions  General ADL Comments: step by step vcs to adhere to WB precations     Vision Baseline Vision/History: 0 No visual deficits       Perception         Praxis         Pertinent Vitals/Pain Pain  Assessment Pain Assessment: Faces Faces Pain Scale: No hurt     Extremity/Trunk Assessment Upper Extremity Assessment Upper Extremity Assessment: Overall WFL for tasks assessed   Lower Extremity Assessment Lower Extremity Assessment: RLE deficits/detail;LLE deficits/detail RLE Deficits / Details: Uncovered opening noted on distal foot, and along medical L calf LLE Deficits / Details: NWB; diabetic foot ulcers   Cervical / Trunk Assessment Cervical / Trunk Assessment: Normal   Communication Communication Communication: No apparent difficulties Cueing Techniques: Verbal cues;Gestural cues;Visual cues   Cognition Arousal: Alert Behavior During Therapy: WFL for tasks assessed/performed Overall Cognitive Status: No family/caregiver present to determine baseline cognitive functioning Area of Impairment: Safety/judgement, Problem solving                         Safety/Judgement: Decreased awareness of deficits   Problem Solving: Requires verbal cues, Requires tactile cues General Comments: Pt AO throughout session     General Comments  Noted edema in BLE; R LE uncovered openings distal calf; LLE dressing intact pre/post session    Exercises Other Exercises Other Exercises: Edu: Role of OT, safe ADL completion, bed positioning to decrease LE swelling   Shoulder Instructions      Home Living Family/patient expects to be discharged to:: Private residence Living Arrangements: Other (Comment) Available Help at Discharge:  (Pt endorses no support at home, daughter may be able to help) Type of Home: House Home Access: Stairs to enter [employer] of Steps: flight (10) Entrance Stairs-Rails: Left Home Layout: One level     Bathroom Shower/Tub: Producer, television/film/video: Standard     Home Equipment: Grab bars - tub/shower;Rolling Environmental consultant (2 wheels)   Additional Comments: Pt mentions possibility of staying with daughter, but would not have  assistance or supervision throughout the day if stays there      Prior Functioning/Environment Prior Level of Function : Independent/Modified Independent;Driving;Working/employed             Mobility Comments: Amb with no DME community distances ADLs Comments: 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; cook/clean/drive        OT Problem List: Decreased safety awareness;Impaired balance (sitting and/or standing);Decreased activity tolerance;Decreased knowledge of precautions;Decreased knowledge of use of DME or AE;Increased edema      OT Treatment/Interventions: Self-care/ADL training;Therapeutic activities;Patient/family education;DME and/or AE instruction;Therapeutic exercise;Balance training    OT Goals(Current goals can be found in the care plan section) Acute Rehab OT Goals Patient Stated Goal: Get better OT Goal Formulation: With patient Time For Goal Achievement: 07/17/23 Potential to Achieve Goals: Good ADL Goals Pt Will Perform Grooming: standing;with modified independence Pt Will Perform Lower Body Dressing: sit to/from stand;with modified independence Pt Will Transfer to Toilet: with modified independence;ambulating Additional ADL Goal #1: Pt will indep verbalize plan to implement 3/3 diabetes mgmt strategies  OT Frequency: Min 1X/week    Co-evaluation              AM-PAC OT "6 Clicks" Daily Activity     Outcome Measure Help from another  person eating meals?: None Help from another person taking care of personal grooming?: A Little Help from another person toileting, which includes using toliet, bedpan, or urinal?: A Little Help from another person bathing (including washing, rinsing, drying)?: A Little Help from another person to put on and taking off regular upper body clothing?: None Help from another person to put on and taking off regular lower body clothing?: None 6 Click Score: 21   End of Session Equipment Utilized During Treatment: Rolling walker  (2 wheels) Nurse Communication: Mobility status;Weight bearing status;Other (comment) (Verbilized with nurse about open wounds on LE)  Activity Tolerance: Patient tolerated treatment well Patient left: in bed;with call bell/phone within reach  OT Visit Diagnosis: Unsteadiness on feet (R26.81);Other abnormalities of gait and mobility (R26.89)                Time: 9937-1696 OT Time Calculation (min): 18 min Charges:  OT General Charges $OT Visit: 1 Visit OT Evaluation $OT Eval Moderate Complexity: 1 Mod [employer], OTS

## 2023-07-03 NOTE — ED Notes (Signed)
Request made for transport to the floor ?

## 2023-07-04 DIAGNOSIS — I5033 Acute on chronic diastolic (congestive) heart failure: Secondary | ICD-10-CM | POA: Diagnosis not present

## 2023-07-04 LAB — CBC WITH DIFFERENTIAL/PLATELET
Abs Immature Granulocytes: 0.03 10*3/uL (ref 0.00–0.07)
Basophils Absolute: 0.1 10*3/uL (ref 0.0–0.1)
Basophils Relative: 1 %
Eosinophils Absolute: 0.4 10*3/uL (ref 0.0–0.5)
Eosinophils Relative: 4 %
HCT: 34.4 % — ABNORMAL LOW (ref 39.0–52.0)
Hemoglobin: 11.2 g/dL — ABNORMAL LOW (ref 13.0–17.0)
Immature Granulocytes: 0 %
Lymphocytes Relative: 16 %
Lymphs Abs: 1.3 10*3/uL (ref 0.7–4.0)
MCH: 30.5 pg (ref 26.0–34.0)
MCHC: 32.6 g/dL (ref 30.0–36.0)
MCV: 93.7 fL (ref 80.0–100.0)
Monocytes Absolute: 0.8 10*3/uL (ref 0.1–1.0)
Monocytes Relative: 10 %
Neutro Abs: 5.6 10*3/uL (ref 1.7–7.7)
Neutrophils Relative %: 69 %
Platelets: 225 10*3/uL (ref 150–400)
RBC: 3.67 MIL/uL — ABNORMAL LOW (ref 4.22–5.81)
RDW: 14.4 % (ref 11.5–15.5)
WBC: 8.2 10*3/uL (ref 4.0–10.5)
nRBC: 0 % (ref 0.0–0.2)

## 2023-07-04 LAB — ECHOCARDIOGRAM COMPLETE
AR max vel: 2.01 cm2
AV Area VTI: 2.19 cm2
AV Area mean vel: 1.96 cm2
AV Mean grad: 6.7 mm[Hg]
AV Peak grad: 11.9 mm[Hg]
Ao pk vel: 1.73 m/s
Area-P 1/2: 3.28 cm2
Height: 78 in
MV VTI: 1.25 cm2
S' Lateral: 5.4 cm
Weight: 4160 [oz_av]

## 2023-07-04 LAB — URINALYSIS, ROUTINE W REFLEX MICROSCOPIC
Bacteria, UA: NONE SEEN
Bilirubin Urine: NEGATIVE
Glucose, UA: NEGATIVE mg/dL
Hgb urine dipstick: NEGATIVE
Ketones, ur: NEGATIVE mg/dL
Nitrite: NEGATIVE
Protein, ur: NEGATIVE mg/dL
Specific Gravity, Urine: 1.015 (ref 1.005–1.030)
Squamous Epithelial / HPF: 0 /[HPF] (ref 0–5)
pH: 6 (ref 5.0–8.0)

## 2023-07-04 LAB — GLUCOSE, CAPILLARY
Glucose-Capillary: 163 mg/dL — ABNORMAL HIGH (ref 70–99)
Glucose-Capillary: 166 mg/dL — ABNORMAL HIGH (ref 70–99)
Glucose-Capillary: 230 mg/dL — ABNORMAL HIGH (ref 70–99)
Glucose-Capillary: 246 mg/dL — ABNORMAL HIGH (ref 70–99)

## 2023-07-04 LAB — BASIC METABOLIC PANEL
Anion gap: 10 (ref 5–15)
BUN: 33 mg/dL — ABNORMAL HIGH (ref 8–23)
CO2: 26 mmol/L (ref 22–32)
Calcium: 8.6 mg/dL — ABNORMAL LOW (ref 8.9–10.3)
Chloride: 101 mmol/L (ref 98–111)
Creatinine, Ser: 1.48 mg/dL — ABNORMAL HIGH (ref 0.61–1.24)
GFR, Estimated: 48 mL/min — ABNORMAL LOW (ref >60)
Glucose, Bld: 165 mg/dL — ABNORMAL HIGH (ref 70–99)
Potassium: 3.8 mmol/L (ref 3.5–5.1)
Sodium: 137 mmol/L (ref 135–145)

## 2023-07-04 MED ORDER — SALINE SPRAY 0.65 % NA SOLN
1.0000 | NASAL | Status: DC | PRN
  Filled 2023-07-04: qty 44

## 2023-07-04 MED ORDER — ALPRAZOLAM 0.25 MG PO TABS
0.2500 mg | ORAL_TABLET | Freq: Once | ORAL | Status: AC
  Administered 2023-07-04: 0.25 mg via ORAL
  Filled 2023-07-04: qty 1

## 2023-07-04 NOTE — Progress Notes (Signed)
Progress Note   Patient: Tyler [name] [place] MWU:[id number] DOB: 10/14/1945 DOA: 07/02/2023     2 DOS: the patient was seen and examined on 07/04/2023     Subjective:  Patient seen and examined at bedside this morning Denies nausea vomiting worsening chest pain Oxygen requirement has improved to 1 L intranasal oxygen today      Brief hospital course: Tyler [name] is a 77 y.o. 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  Patient has been having worsening bilateral leg edema in the past several days with associated shortness of breath that has progressively worsened. He has chronic diabetic foot ulcers and follows up with [name] podiatry. On arrival patient was found to have cellulitis involving the right lower extremity.  In the setting of elevated BNP of 1066.  Patient therefore admitted under hospitalist service for management of cellulitis as well as acute on chronic diastolic heart failure   Assessment and Plan:   Acute on chronic diastolic CHF (congestive heart failure) [facility]): Patient has leg edema, significantly elevated BNP 1066, vascular congestion on chest x-ray, clinically consistent with CHF exacerbation.  2D echo 09/26/2020 showed EF of 55 to 60%. Continue IV diuresis with IV Lasix 40 mg twice daily Echo report pending Depending on echo report if EF is decreased we will consult cardiology Continue input and output monitoring Low-salt diet Continue fluid restriction to 1.5 L/day Continue Daily weighing   Myocardial injury:  Follow-up on echo report -Trend troponin -Aspirin A1c of 7.2   Persistent atrial fibrillation [facility]):  Patient is not taking  anticoagulants.  Heart rate 90-110s Continue   Essential hypertension Continue Lasix and Coreg Continue blood pressure monitoring as well as as needed hydralazine   Type II diabetes mellitus with renal manifestations (HCC):  Recent A1c 6.4, well-controlled.   Patient is taking metformin and glipizide at home -Sliding scale insulin   Chronic kidney disease, stage 3a (HCC):  Renal function close to baseline.   Recent baseline creatinine 1.3-1.4.   His creatinine is 1.45, BUN 36, GFR 49 Continue to monitor renal function closely   Cellulitis of right lower extremity:  Patient has chronic bilateral lower leg erythema, but he has warmth in the right lower leg on touch, indicating possible cellulitis. Continue ceftriaxone Follow-up ESR and CRP Follow-up blood culture Doppler ultrasound did not show any evidence of DVT   Diabetic foot ulcer (HCC): Chronic bilateral foot ulcers.  Patient following up with with [name] of podiatry, currently on Augmentin.   Continue ceftriaxone Patient follows up with [name] of for podiatry Wound care on board we appreciate input Continue PT OT   Obesity (BMI 30-39.9): Body weight 117.9 kg, BMI 30.05 -Encourage losing weight -Exercise and healthy diet      DVT ppx: SQ Lovenox   Code Status: Full code     Consults called:  none   Data Reviewed: I have reviewed patient's lab results as shown below as well as transition of care manager documentation and nursing documentation, I have reviewed cross-cover documentation as well as urinalysis results.  Family Communication: No family at bedside   Disposition: Status is: Inpatient    Planned Discharge Destination: Skilled nursing facility   Time spent:  50 minutes   Physical Exam: General: Elderly male laying in bed on intranasal oxygen Heme: No neck lymph node enlargement. Cardiac: S1/S2, RRR, No murmurs, No gallops or rubs. Respiratory: Bilateral basal crackles GI: Soft, nondistended,  nontender Ext: 2+ pitting leg edema bilaterally. 1+DP/PT pulse bilaterally. Musculoskeletal: Erythema noted to be more pronounced on the right lower extremity below the knee CNS: Alert and oriented      Latest Ref Rng & Units 07/04/2023    3:33 AM 07/03/2023    4:38 AM 07/02/2023   12:47 PM  CBC  WBC 4.0 - 10.5 K/uL 8.2  7.9  10.0   Hemoglobin 13.0 - 17.0 g/dL 04.5  40.9  81.1   Hematocrit 39.0 - 52.0 % 34.4  33.8  36.8   Platelets 150 - 400 K/uL 225  219  239        Latest Ref Rng & Units 07/04/2023    3:33 AM 07/03/2023    4:38 AM 07/02/2023   12:47 PM  BMP  Glucose 70 - 99 mg/dL 914  782  956   BUN 8 - 23 mg/dL 33  33  36   Creatinine 0.61 - 1.24 mg/dL 2.13  0.86  5.78   Sodium 135 - 145 mmol/L 137  137  136   Potassium 3.5 - 5.1 mmol/L 3.8  4.1  4.1   Chloride 98 - 111 mmol/L 101  103  102   CO2 22 - 32 mmol/L 26  21  24    Calcium 8.9 - 10.3 mg/dL 8.6  8.5  8.8     Vitals:   07/04/23 0530 07/04/23 0746 07/04/23 1227 07/04/23 1602  BP: 122/76 128/76 (!) 115/58 130/87  Pulse:  87 78 86  Resp: (!) 22 20 18 20   Temp: 98.8 F (37.1 C) 97.7 F (36.5 C) 98.3 F (36.8 C) 98.5 F (36.9 C)  TempSrc: Oral Oral Oral Oral  SpO2: 95% 99% 96% 96%  Weight:      Height:        Author: Loyce Dys, MD 07/04/2023 6:00 PM  For on call review [email or web address].

## 2023-07-04 NOTE — Plan of Care (Signed)
  Problem: Coping: Goal: Ability to adjust to condition or change in health will improve Outcome: Progressing   Problem: Fluid Volume: Goal: Ability to maintain a balanced intake and output will improve Outcome: Progressing   Problem: Metabolic: Goal: Ability to maintain appropriate glucose levels will improve Outcome: Progressing   Problem: Education: Goal: Knowledge of General Education information will improve Description: Including pain rating scale, medication(s)/side effects and non-pharmacologic comfort measures Outcome: Progressing   Problem: Health Behavior/Discharge Planning: Goal: Ability to manage health-related needs will improve Outcome: Progressing   Problem: Coping: Goal: Level of anxiety will decrease Outcome: Progressing   Problem: Elimination: Goal: Will not experience complications related to urinary retention Outcome: Progressing

## 2023-07-04 NOTE — Progress Notes (Signed)
Occupational Therapy Treatment Patient Details Name: Tyler [name] MRN: [id number] DOB: 26-Mar-1946 Today's Date: 07/04/2023   History of present illness Patient  77 y.o. 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 Current MD assessment: Acute on chronic CHF exacerbation, cellulitis of RLE, and diabetic foot ulcers.   OT comments  Pt seen for OT tx. Pt received in bed, agreeable to session and eager to attempt to use the bathroom. Pt denies pain or other complaints. Pt completed bed mobility with supervision and ADL transfers with RW from EOB with SBA and from std height toilet with supervision for safety. Pt instructed in WBing restrictions and pt endorsed understanding, however reports that the MD told him he could put weight through his L heel. Pt declined to trial use of BSC due to [facility] restrictions and instead was insistent upon ambulating with RW to bathroom and unable to maintain pure NWBing at all despite VC. Pt completed pericare and clothing mgt with remote supervision for safety. Pt denied discomfort in his L foot throughout the session. Pt educated in falls prevention. Pt continues to benefit from skilled OT services to maximize safety/indep.       If plan is discharge home, recommend the following:  A lot of help with walking and/or transfers;Assist for transportation;Help with stairs or ramp for entrance;Assistance with cooking/housework;A little help with bathing/dressing/bathroom   Equipment Recommendations  Other (comment) (defer to next venue)    Recommendations for Other Services      Precautions / Restrictions Precautions Precautions:  Fall Restrictions Weight Bearing Restrictions: Yes LLE Weight Bearing: Non weight bearing       Mobility Bed Mobility Overal bed mobility: Needs Assistance Bed Mobility: Supine to Sit, Sit to Supine     Supine to sit: Supervision Sit to supine: Supervision        Transfers Overall transfer level: Needs assistance Equipment used: Rolling walker (2 wheels) Transfers: Sit to/from Stand Sit to Stand: Contact guard assist                 Balance Overall balance assessment: Needs assistance Sitting-balance support: Feet supported Sitting balance-Leahy Scale: Normal     Standing balance support: Bilateral upper extremity supported Standing balance-Leahy Scale: Fair                             ADL either performed or assessed with clinical judgement   ADL Overall ADL's : Needs assistance/impaired                         Toilet Transfer: Ambulation;Regular Toilet;Rolling walker (2 wheels);Supervision/safety   Toileting- Architect and Hygiene: Modified independent       Functional mobility during ADLs: Supervision/safety;Cueing for safety;Rolling walker (2 wheels) General ADL Comments: MOD-MAX VC for WBing precautions    Extremity/Trunk Assessment              Vision       Perception     Praxis      Cognition Arousal: Alert Behavior During Therapy: WFL for tasks assessed/performed Overall Cognitive Status: No family/caregiver present to determine baseline cognitive functioning Area of Impairment: Safety/judgement, Problem solving  Safety/Judgement: Decreased awareness of deficits, Decreased awareness of safety   Problem Solving: Requires verbal cues, Requires tactile cues          Exercises      Shoulder Instructions       General Comments BLE edema noted    Pertinent Vitals/ Pain       Pain Assessment Pain Assessment: No/denies pain  Home Living                                           Prior Functioning/Environment              Frequency  Min 1X/week        Progress Toward Goals  OT Goals(current goals can now be found in the care plan section)  Progress towards OT goals: Progressing toward goals  Acute Rehab OT Goals Patient Stated Goal: get better OT Goal Formulation: With patient Time For Goal Achievement: 07/17/23 Potential to Achieve Goals: Good  Plan      Co-evaluation                 AM-PAC OT "6 Clicks" Daily Activity     Outcome Measure   Help from another person eating meals?: None Help from another person taking care of personal grooming?: None Help from another person toileting, which includes using toliet, bedpan, or urinal?: A Little Help from another person bathing (including washing, rinsing, drying)?: A Little Help from another person to put on and taking off regular upper body clothing?: None Help from another person to put on and taking off regular lower body clothing?: A Little 6 Click Score: 21    End of Session Equipment Utilized During Treatment: Rolling walker (2 wheels)  OT Visit Diagnosis: Unsteadiness on feet (R26.81);Other abnormalities of gait and mobility (R26.89)   Activity Tolerance Patient tolerated treatment well   Patient Left in bed;with call bell/phone within reach;with bed alarm set   Nurse Communication          Time: 4098-1191 OT Time Calculation (min): 29 min  Charges: OT General Charges $OT Visit: 1 Visit OT Treatments $Self Care/Home Management : 23-37 mins  Arman Filter., MPH, MS, OTR/L ascom [phone] 07/04/23, 3:16 PM

## 2023-07-04 NOTE — Progress Notes (Signed)
[facility] HF Stewardship  PCP: Elder Negus, NP  PCP-Cardiologist: None  HPI: Tyler [name] is a 77 y.o. male with HTN, HLD, DM, PVD, SSS (s/p of PPM), dCHF, CKD-3A, obesity, rheumatic fever, atrial fibrillation not on anticoagulants, chronic diabetic foot ulcers, s/p third metatarsal head resection with bone biopsy right foot who presented with shortness of breath and lower extremity edema. Troponin on admission was 24. BNP on admission was elevated at 1066.9. CXR on admission showed mild pulmonary vascular congestion. Echo this admission pending.  Pertinent cardiac history: Long term holter in 03/2020 showed 6.1% PVC burden and multiple AF episodes. Ablation performed and pacemaker placed in 08/2020. Stress test 03/2023 showed no evidence of ischemia and LVEF of 57%. TTE in 08/2020 and 09/2020 showed LVEF of 55-60%.   Pertinent Lab Values: Creatinine  Date Value Ref Range Status  07/10/2014 1.26 0.60 - 1.30 mg/dL Final   Creatinine, Ser  Date Value Ref Range Status  07/04/2023 1.48 (H) 0.61 - 1.24 mg/dL Final   BUN  Date Value Ref Range Status  07/04/2023 33 (H) 8 - 23 mg/dL Final  29/56/2130 10 7 - 18 mg/dL Final   Potassium  Date Value Ref Range Status  07/04/2023 3.8 3.5 - 5.1 mmol/L Final  07/10/2014 3.7 3.5 - 5.1 mmol/L Final   Sodium  Date Value Ref Range Status  07/04/2023 137 135 - 145 mmol/L Final  07/10/2014 142 136 - 145 mmol/L Final   B Natriuretic Peptide  Date Value Ref Range Status  07/02/2023 1,066.9 (H) 0.0 - 100.0 pg/mL Final    Comment:    Performed at [facility], [address] [place], [place] [address]   Magnesium  Date Value Ref Range Status  07/03/2023 2.4 1.7 - 2.4 mg/dL Final    Comment:    Performed at [facility], [address] [place], [place] [address]  07/10/2014 1.8 mg/dL Final    Comment:    9.5-2.8 THERAPEUTIC RANGE: 4-7 mg/dL TOXIC: > 10 mg/dL  -----------------------    Hemoglobin A1C  Date Value  Ref Range Status  07/05/2014 10.4 (H) 4.2 - 6.3 % Final    Comment:    The American Diabetes Association recommends that a primary goal of therapy should be <7% and that physicians should reevaluate the treatment regimen in patients with HbA1c values consistently >8%. Hemoglobin A1c - ** THIS IS A CORRECTED REPORT **  - PLEASE DISREGARD PREVIOUS RESULT.  - Corrected report called to and read back  - by Crista Elliot on 1C @ 1155 07/06/14.  - BGB    Hgb A1c MFr Bld  Date Value Ref Range Status  07/02/2023 7.2 (H) 4.8 - 5.6 % Final    Comment:    (NOTE) Pre diabetes:          5.7%-6.4%  Diabetes:              >6.4%  Glycemic control for   <7.0% adults with diabetes    TSH  Date Value Ref Range Status  06/28/2020 1.726 0.350 - 4.500 uIU/mL Final    Comment:    Performed by a 3rd Generation assay with a functional sensitivity of <=0.01 uIU/mL. Performed at [facility], [address] [place], [place] [address]     Vital Signs: Temp:  [97.5 F (36.4 C)-98.8 F (37.1 C)] 97.7 F (36.5 C) (10/10 0746) Pulse Rate:  [72-96] 87 (10/10 0746) Cardiac Rhythm: Normal sinus rhythm (10/09 2032) Resp:  [16-35] 20 (10/10 0746)  BP: (116-128)/(65-77) 128/76 (10/10 0746) SpO2:  [94 %-99 %] 99 % (10/10 0746)   Intake/Output Summary (Last 24 hours) at 07/04/2023 0942 Last data filed at 07/04/2023 0300 Gross per 24 hour  Intake 580 ml  Output 2100 ml  Net -1520 ml    Current Inpatient HFpEF Medications:  -Furosemide 40 mg IV BID Other vasoactive medications include carvedilol 6.25 mg BID for AF  Prior to admission HF Medications:  -Furosemide 40 mg daily and metolazone 2.5 mg on Mondays Reported not taking lisinopril or carvedilol  Assessment: 1. 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 NYHA class III symptoms.  -Symptoms: Reports shortness of breath with exertion.  -Volume: Moderate urine output, creatinine and BUN have been stable. Urine color was  bright yellow. Agree with continuing IV diuresi, can consider increasing to 60 mg per dose. -Hemodynamics: BP within normal range. Pulse 70-80s.  -MRA: Given first line HFpEF agent is not ideal, renal function is stable and K is <4, can consider starting spironolactone.  -SGLT2i: Not a candidate given chronic foley catheter -ARNI: BP not amenable to Entresto at this time. -Given AF and CHADSVASc >3, may benefit from chronic anticoagulation. Given history of rheumatic fever, if valvular disease is present, would require warfarin per INVICTUS trial.  Plan: 1) Medication changes recommended at this time: -Consider adding spironolactone 25 mg daily  2) Patient assistance: -None  3) Education: - Patient has been educated on current HF medications and potential additions to HF medication regimen - Patient verbalizes understanding that over the next few months, these medication doses may change and more medications may be added to optimize HF regimen - Patient has been educated on basic disease state pathophysiology and goals of therapy   Medication Assistance / Insurance Benefits Check:  Does the patient have prescription insurance?  [employer] commercial insurance  Type of insurance plan:   Does the patient qualify for medication assistance through manufacturers or grants? No   Outpatient Pharmacy:  [facility] Is the patient willing to utilize a [facility] pharmacy at discharge?: Yes  Please do not hesitate to reach out with questions or concerns,  Enos Fling, PharmD, BCPS Heart Failure Pharmacist Phone - [phone] 07/04/2023 9:42 AM

## 2023-07-04 NOTE — TOC Progression Note (Signed)
Transition of Care [facility]) - Progression Note    Patient Details  Name: Tyler [name] MRN: [id number] Date of Birth: 09-Feb-1946  [facility]) CM/SW Contact  Truddie Hidden, RN Phone Number: 07/04/2023, 3:27 PM  Clinical Narrative:    Spoke with patient regarding therapy's recommendation for SNF. Patient initially stated he would go then changed his mind after being advised he is not allowed to drive himself to the facility. Patient stated "I'm not as bad off as they think I am." He reports he has a roommate and a daughter that can assist him at home. RNCM offered HH. Patient refused HH. He has been advised he can change his mind prior to discharge or follow up with his PCP after discharge.         Expected Discharge Plan and Services                                               Social Determinants of Health (SDOH) Interventions SDOH Screenings   Food Insecurity: No Food Insecurity (03/20/2023)   Received from [facility], [facility]  Housing: Low Risk  (10/30/2022)  Transportation Needs: No Transportation Needs (03/20/2023)   Received from [facility], [facility]  Utilities: Not At Risk (03/20/2023)   Received from [facility], [facility]  Financial Resource Strain: Low Risk  (03/20/2023)   Received from [facility], [facility]  Social Connections: Unknown (02/01/2022)   Received from [facility], [facility]  Stress: No Stress Concern Present (09/07/2020)   Received from [facility], [facility]  Tobacco Use: Low Risk  (05/03/2023)    Readmission Risk Interventions     No data to display

## 2023-07-05 ENCOUNTER — Telehealth: Payer: Self-pay | Admitting: Podiatry

## 2023-07-05 DIAGNOSIS — I5033 Acute on chronic diastolic (congestive) heart failure: Secondary | ICD-10-CM | POA: Diagnosis not present

## 2023-07-05 LAB — BASIC METABOLIC PANEL
Anion gap: 14 (ref 5–15)
BUN: 37 mg/dL — ABNORMAL HIGH (ref 8–23)
CO2: 25 mmol/L (ref 22–32)
Calcium: 9 mg/dL (ref 8.9–10.3)
Chloride: 99 mmol/L (ref 98–111)
Creatinine, Ser: 1.38 mg/dL — ABNORMAL HIGH (ref 0.61–1.24)
GFR, Estimated: 52 mL/min — ABNORMAL LOW (ref >60)
Glucose, Bld: 266 mg/dL — ABNORMAL HIGH (ref 70–99)
Potassium: 4.1 mmol/L (ref 3.5–5.1)
Sodium: 138 mmol/L (ref 135–145)

## 2023-07-05 LAB — CBC WITH DIFFERENTIAL/PLATELET
Abs Immature Granulocytes: 0.03 10*3/uL (ref 0.00–0.07)
Basophils Absolute: 0.1 10*3/uL (ref 0.0–0.1)
Basophils Relative: 1 %
Eosinophils Absolute: 0.4 10*3/uL (ref 0.0–0.5)
Eosinophils Relative: 4 %
HCT: 34.7 % — ABNORMAL LOW (ref 39.0–52.0)
Hemoglobin: 11.3 g/dL — ABNORMAL LOW (ref 13.0–17.0)
Immature Granulocytes: 0 %
Lymphocytes Relative: 16 %
Lymphs Abs: 1.3 10*3/uL (ref 0.7–4.0)
MCH: 30.5 pg (ref 26.0–34.0)
MCHC: 32.6 g/dL (ref 30.0–36.0)
MCV: 93.5 fL (ref 80.0–100.0)
Monocytes Absolute: 0.8 10*3/uL (ref 0.1–1.0)
Monocytes Relative: 9 %
Neutro Abs: 5.8 10*3/uL (ref 1.7–7.7)
Neutrophils Relative %: 70 %
Platelets: 223 10*3/uL (ref 150–400)
RBC: 3.71 MIL/uL — ABNORMAL LOW (ref 4.22–5.81)
RDW: 14.6 % (ref 11.5–15.5)
WBC: 8.3 10*3/uL (ref 4.0–10.5)
nRBC: 0 % (ref 0.0–0.2)

## 2023-07-05 LAB — GLUCOSE, CAPILLARY
Glucose-Capillary: 207 mg/dL — ABNORMAL HIGH (ref 70–99)
Glucose-Capillary: 233 mg/dL — ABNORMAL HIGH (ref 70–99)
Glucose-Capillary: 174 mg/dL — ABNORMAL HIGH (ref 70–99)
Glucose-Capillary: 302 mg/dL — ABNORMAL HIGH (ref 70–99)

## 2023-07-05 MED ORDER — CARVEDILOL 6.25 MG PO TABS
6.2500 mg | ORAL_TABLET | Freq: Two times a day (BID) | ORAL | Status: AC
  Administered 2023-07-05: 6.25 mg via ORAL
  Filled 2023-07-05: qty 1

## 2023-07-05 MED ORDER — SPIRONOLACTONE 12.5 MG HALF TABLET
12.5000 mg | ORAL_TABLET | Freq: Every day | ORAL | Status: DC
  Administered 2023-07-05 – 2023-07-07 (×3): 12.5 mg via ORAL
  Filled 2023-07-05 (×3): qty 1

## 2023-07-05 MED ORDER — METOPROLOL SUCCINATE ER 50 MG PO TB24
50.0000 mg | ORAL_TABLET | Freq: Every day | ORAL | Status: DC
  Administered 2023-07-06 – 2023-07-07 (×2): 50 mg via ORAL
  Filled 2023-07-05 (×2): qty 1

## 2023-07-05 NOTE — Care Management Important Message (Signed)
Important Message  Patient Details  Name: Tyler [name] MRN: [id number] Date of Birth: [birth year]   Important Message Given:  N/A - LOS <3 / Initial given by admissions     Olegario Messier A Gaspar Fowle 07/05/2023, 12:44 PM

## 2023-07-05 NOTE — Progress Notes (Signed)
Progress Note   Patient: Tyler [name] ZOX:[id number] DOB: April 23, 1946 DOA: 07/02/2023     3 DOS: the patient was seen and examined on 07/05/2023  Subjective:  Patient seen and examined at bedside this morning Cardiology is consulted on account of low EF Denied worsening shortness of breath chest pain or cough  Brief hospital course: From HPI "Tyler [name] is a 77 y.o. 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  Patient has been having worsening bilateral leg edema in the past several days with associated shortness of breath that has progressively worsened. He has chronic diabetic foot ulcers and follows up with [name] podiatry. On arrival patient was found to have cellulitis involving the right lower extremity.  In the setting of elevated BNP of 1066.  Patient therefore admitted under hospitalist service for management of cellulitis as well as acute on chronic diastolic heart failure "   Assessment and Plan:   Acute on chronic diastolic CHF (congestive heart failure) [facility]): Patient has leg edema, significantly elevated BNP 1066, vascular congestion on chest x-ray, clinically consistent with CHF exacerbation.  2D echo 09/26/2020 showed EF of 55 to 60%. Continue IV diuresis with IV Lasix 40 mg twice daily Cardiologist has been consulted and case discussed Echo showing EF of 30 to 35% Continue input and output monitoring Low-salt diet Continue fluid restriction to 1.5 L/day Continue Daily weighing   Myocardial injury:  Echocardiogram showing EF of 30 to 35% -Trend troponin -Aspirin A1c of 7.2   Persistent atrial fibrillation [facility]):  Patient is not taking  anticoagulants.  Heart rate 90-110s Continue   Essential hypertension Continue Lasix and Coreg Continue blood pressure monitoring as well as as needed hydralazine   Type II diabetes mellitus with renal manifestations (HCC):  Recent A1c 6.4, well-controlled.   Patient is taking metformin and glipizide at home -Sliding scale insulin   Chronic kidney disease, stage 3a (HCC):  Renal function close to baseline.   Recent baseline creatinine 1.3-1.4.   His creatinine is 1.45, BUN 36, GFR 49 Continue to monitor renal function closely   Cellulitis of right lower extremity:  Patient has chronic bilateral lower leg erythema, but he has warmth in the right lower leg on touch, indicating possible cellulitis. Continue ceftriaxone Doppler ultrasound did not show any evidence of DVT   Diabetic foot ulcer (HCC): Chronic bilateral foot ulcers.  Patient following up with with [name] of podiatry, currently on Augmentin.   Continue ceftriaxone Patient follows up with [name] of for podiatry Wound care on board we appreciate input Continue PT OT   Obesity (BMI 30-39.9): Body weight 117.9 kg, BMI 30.05 -Encourage losing weight -Exercise and healthy diet      DVT ppx: SQ Lovenox   Code Status: Full code     Consults called:  none   Data Reviewed: I have reviewed patient's labs as well as cardiology documentation, I have discussed the case with transition of care manager as well as cardiology   Family Communication: No family at bedside   Disposition: Status is: Inpatient    Planned Discharge Destination: Skilled nursing facility   Time spent: 50 minutes   Physical Exam: General: Elderly male sitting up in a  chair Heme: No neck lymph node enlargement. Cardiac: S1/S2, RRR, No murmurs, No gallops or rubs. Respiratory: Bilateral basal crackles GI: Soft, nondistended, nontender Ext: 2+ pitting leg edema bilaterally. 1+DP/PT pulse bilaterally. Musculoskeletal: Erythema noted to be  more pronounced on the right lower extremity below the knee CNS: Alert and oriented    Latest Ref Rng & Units 07/05/2023    5:02 AM 07/04/2023    3:33 AM 07/03/2023    4:38 AM  CBC  WBC 4.0 - 10.5 K/uL 8.3  8.2  7.9   Hemoglobin 13.0 - 17.0 g/dL 54.0  98.1  19.1   Hematocrit 39.0 - 52.0 % 34.7  34.4  33.8   Platelets 150 - 400 K/uL 223  225  219        Latest Ref Rng & Units 07/05/2023    5:02 AM 07/04/2023    3:33 AM 07/03/2023    4:38 AM  BMP  Glucose 70 - 99 mg/dL 478  295  621   BUN 8 - 23 mg/dL 37  33  33   Creatinine 0.61 - 1.24 mg/dL 3.08  6.57  8.46   Sodium 135 - 145 mmol/L 138  137  137   Potassium 3.5 - 5.1 mmol/L 4.1  3.8  4.1   Chloride 98 - 111 mmol/L 99  101  103   CO2 22 - 32 mmol/L 25  26  21    Calcium 8.9 - 10.3 mg/dL 9.0  8.6  8.5     Vitals:   07/05/23 0633 07/05/23 0804 07/05/23 1132 07/05/23 1546  BP: 130/75 (!) 128/90 128/83 126/77  Pulse: 70 78 76 68  Resp: 18 20 20 20   Temp: 98.2 F (36.8 C) 97.6 F (36.4 C) 98.8 F (37.1 C) 98.8 F (37.1 C)  TempSrc: Oral Oral Oral Oral  SpO2: 99% 98% 99% 100%  Weight:      Height:        Author: Loyce Dys, MD 07/05/2023 4:09 PM  For on call review [email or web address].

## 2023-07-05 NOTE — Progress Notes (Signed)
Occupational Therapy Treatment Patient Details Name: Tyler [name] MRN: [id number] DOB: [birth year] Today's Date: 07/05/2023   History of present illness Patient  77 y.o. 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 Current MD assessment: Acute on chronic CHF exacerbation, cellulitis of RLE, and diabetic foot ulcers.   OT comments  Pt seen for OT tx. Pt in recliner, declines to ambulate or complete ADL at sink, preferring to stay in recliner. Agreeable to problem solving for ADL. Pt educated in activity pacing and home/routines modifications for bathing and work to improve safety and support energy conservation. Pt instructed in HEP (STS repetitions, wall push ups) to complete at home to improve strength and activity tolerance. Pt verbalized understanding. VC intermittently to redirect to task as pt is easily distractible with various politic, historical, and social topics. Pt continues to benefit from skilled OT services. Pt reports he is not open to therapy upon discharge.       If plan is discharge home, recommend the following:  A little help with walking and/or transfers;A little help with bathing/dressing/bathroom;Assistance with cooking/housework;Assist for transportation;Help with stairs or ramp for entrance   Equipment Recommendations  Tub/shower seat    Recommendations for Other Services      Precautions / Restrictions Precautions Precautions: Fall Restrictions Weight Bearing Restrictions: Yes LLE Weight Bearing: Non weight bearing Other Position/Activity Restrictions: pt reports he was told "as little as possible - they haven't talked much about it"        Mobility Bed Mobility               General bed mobility comments: in recliner at start and end of session    Transfers Overall transfer level: Needs assistance   Transfers: Sit to/from Stand Sit to Stand: Contact guard assist                 Balance Overall balance assessment: Needs assistance Sitting-balance support: Feet supported Sitting balance-Leahy Scale: Normal     Standing balance support: Single extremity supported, No upper extremity supported Standing balance-Leahy Scale: Fair Standing balance comment: fair static standing PRN UE support                           ADL either performed or assessed with clinical judgement   ADL                                              Extremity/Trunk Assessment              Vision       Perception     Praxis      Cognition Arousal: Alert Behavior During Therapy: WFL for tasks assessed/performed Overall Cognitive Status: No family/caregiver present to determine baseline cognitive functioning Area of Impairment: Safety/judgement                         Safety/Judgement: Decreased awareness of deficits              Exercises Other Exercises Other Exercises: Pt educated in  activity pacing and home/routines modifications for bathing and work to improve safety and support energy conservation. Other Exercises: Pt instructed in HEP (STS repetitions, wall push ups) to complete at home to improve strength and activity tolerance    Shoulder Instructions       General Comments      Pertinent Vitals/ Pain       Pain Assessment Pain Assessment: No/denies pain  Home Living                                          Prior Functioning/Environment              Frequency  Min 1X/week        Progress Toward Goals  OT Goals(current goals can now be found in the care plan section)  Progress towards OT goals: Progressing toward  goals  Acute Rehab OT Goals Patient Stated Goal: get better OT Goal Formulation: With patient Time For Goal Achievement: 07/17/23 Potential to Achieve Goals: Good  Plan      Co-evaluation                 AM-PAC OT "6 Clicks" Daily Activity     Outcome Measure   Help from another person eating meals?: None Help from another person taking care of personal grooming?: None Help from another person toileting, which includes using toliet, bedpan, or urinal?: A Little Help from another person bathing (including washing, rinsing, drying)?: A Little Help from another person to put on and taking off regular upper body clothing?: None Help from another person to put on and taking off regular lower body clothing?: A Little 6 Click Score: 21    End of Session Equipment Utilized During Treatment: Oxygen  OT Visit Diagnosis: Unsteadiness on feet (R26.81);Other abnormalities of gait and mobility (R26.89)   Activity Tolerance Patient tolerated treatment well   Patient Left in chair;with call bell/phone within reach;with chair alarm set   Nurse Communication          Time: [phone] OT Time Calculation (min): 32 min  Charges: OT General Charges $OT Visit: 1 Visit OT Treatments $Self Care/Home Management : 8-22 mins  Arman Filter., MPH, MS, OTR/L ascom [phone] 07/05/23, 9:54 AM

## 2023-07-05 NOTE — Plan of Care (Signed)

## 2023-07-05 NOTE — Progress Notes (Signed)
[facility] HF Stewardship  PCP: Elder Negus, NP  PCP-Cardiologist: None  HPI: Tyler [name] is a 77 y.o. male with HTN, HLD, DM, PVD, SSS (s/p of PPM), dCHF, CKD-3A, obesity, rheumatic fever, atrial fibrillation not on anticoagulants, chronic diabetic foot ulcers, s/p third metatarsal head resection with bone biopsy right foot who presented with shortness of breath and lower extremity edema. Troponin on admission was 24. BNP on admission was elevated at 1066.9. CXR on admission showed mild pulmonary vascular congestion. Echo this admission showed LVEF of 30-35% and moderate MR.   Pertinent cardiac history: Long term holter in 03/2020 showed 6.1% PVC burden and multiple AF episodes. Ablation performed and pacemaker placed in 08/2020. Stress test 03/2023 showed no evidence of ischemia and LVEF of 57%. TTE in 08/2020 and 09/2020 showed LVEF of 55-60%.   Pertinent Lab Values: Creatinine  Date Value Ref Range Status  07/10/2014 1.26 0.60 - 1.30 mg/dL Final   Creatinine, Ser  Date Value Ref Range Status  07/04/2023 1.48 (H) 0.61 - 1.24 mg/dL Final   BUN  Date Value Ref Range Status  07/04/2023 33 (H) 8 - 23 mg/dL Final  98/07/9146 10 7 - 18 mg/dL Final   Potassium  Date Value Ref Range Status  07/04/2023 3.8 3.5 - 5.1 mmol/L Final  07/10/2014 3.7 3.5 - 5.1 mmol/L Final   Sodium  Date Value Ref Range Status  07/04/2023 137 135 - 145 mmol/L Final  07/10/2014 142 136 - 145 mmol/L Final   B Natriuretic Peptide  Date Value Ref Range Status  07/02/2023 1,066.9 (H) 0.0 - 100.0 pg/mL Final    Comment:    Performed at [facility], [address] [place], [place] [address]   Magnesium  Date Value Ref Range Status  07/03/2023 2.4 1.7 - 2.4 mg/dL Final    Comment:    Performed at [facility], [address] [place], [place] [address]  07/10/2014 1.8 mg/dL Final    Comment:    6.5-7.8 THERAPEUTIC RANGE: 4-7 mg/dL TOXIC: > 10 mg/dL  -----------------------     Hemoglobin A1C  Date Value Ref Range Status  07/05/2014 10.4 (H) 4.2 - 6.3 % Final    Comment:    The American Diabetes Association recommends that a primary goal of therapy should be <7% and that physicians should reevaluate the treatment regimen in patients with HbA1c values consistently >8%. Hemoglobin A1c - ** THIS IS A CORRECTED REPORT **  - PLEASE DISREGARD PREVIOUS RESULT.  - Corrected report called to and read back  - by Crista Elliot on 1C @ 1155 07/06/14.  - BGB    Hgb A1c MFr Bld  Date Value Ref Range Status  07/02/2023 7.2 (H) 4.8 - 5.6 % Final    Comment:    (NOTE) Pre diabetes:          5.7%-6.4%  Diabetes:              >6.4%  Glycemic control for   <7.0% adults with diabetes    TSH  Date Value Ref Range Status  06/28/2020 1.726 0.350 - 4.500 uIU/mL Final    Comment:    Performed by a 3rd Generation assay with a functional sensitivity of <=0.01 uIU/mL. Performed at [facility], [address] [place], [place] [address]     Vital Signs: Temp:  [98.1 F (36.7 C)-98.9 F (37.2 C)] 98.2 F (36.8 C) (10/11 0633) Pulse Rate:  [70-86] 70 (10/11 9528) Cardiac Rhythm: Ventricular paced;A-V Sequential paced (  10/10 1900) Resp:  [18-20] 18 (10/11 0633) BP: (101-130)/(46-87) 130/75 (10/11 0633) SpO2:  [96 %-99 %] 99 % (10/11 4166)   Intake/Output Summary (Last 24 hours) at 07/05/2023 0752 Last data filed at 07/05/2023 0630 Gross per 24 hour  Intake 580 ml  Output 2550 ml  Net -1970 ml    Current Inpatient HF Medications:  -Furosemide 40 mg IV BID -Carvedilol 6.25 mg BID Other vasoactive medications include carvedilol 6.25 mg BID for AF  Prior to admission HF Medications:  -Furosemide 40 mg daily and metolazone 2.5 mg on Mondays Reported not taking lisinopril or carvedilol  Assessment: 1. 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 NYHA class III symptoms.  -Symptoms: Reports breathing has improved. Sitting in chair eating  breakfast this AM. -Volume: Good urine output yesterday, creatinine stable, BUN trending up. No weight in days. Can consider transitioning to oral diuretics soon. -Hemodynamics: BP within normal range. Pulse 70-80s.  -BB: Current on carvedilol 6.25 mg BID, however pt reports he will not take it at home due to blurred vision. Can consider swapping to metoprolol succinate. -ACE/ARB/ARNI: BP not amenable to Entresto at this time, can consider low dose losartan prior to discharge. -MRA: Renal function is stable can consider starting spironolactone.  -SGLT2i: Not a candidate given chronic foley catheter -Given AF and CHADSVASc >3, may benefit from chronic anticoagulation. Given history of rheumatic fever, if valvular disease is present, would require warfarin per INVICTUS trial.  Plan: 1) Medication changes recommended at this time: -Consider adding spironolactone 25 mg daily -Consider changing carvedilol to metoprolol succinate 25 mg daily due to adverse effects and patient compliance  2) Patient assistance: -None  3) Education: - Patient has been educated on current HF medications and potential additions to HF medication regimen - Patient verbalizes understanding that over the next few months, these medication doses may change and more medications may be added to optimize HF regimen - Patient has been educated on basic disease state pathophysiology and goals of therapy   Medication Assistance / Insurance Benefits Check:  Does the patient have prescription insurance?  [employer] commercial insurance  Type of insurance plan:   Does the patient qualify for medication assistance through manufacturers or grants? No   Outpatient Pharmacy:  [facility] Is the patient willing to utilize a [facility] pharmacy at discharge?: Yes  Please do not hesitate to reach out with questions or concerns,  Enos Fling, PharmD, BCPS Heart Failure Pharmacist Phone -  [phone] 07/05/2023 7:52 AM

## 2023-07-05 NOTE — Consult Note (Addendum)
PODIATRY CONSULTATION  NAME Tyler [name] MRN [id number] DOB 11-Mar-1946 DOA 07/02/2023   Reason for consult:  Chief Complaint  Patient presents with   Leg Swelling    Attending/Consulting physician:  Dr. Rosezetta Schlatter MD  History of present illness: 77 y.o. male with past medical history of hypertension, hyperlipidemia, diabetes, peripheral vascular disease, CHF CKD stage III, obesity, A-fib.  He does have a pacemaker podiatry consulted for evaluation of diabetic foot ulcers, left lower extremity wounds.  Patient sees [name] in the outpatient setting.  Patient initially presented with shortness of breath, he states this has improved.  He denies nausea, vomiting, fever, chills, chest pain.  CT scan had previously been ordered by [name] in the outpatient setting, no read has returned at this point.  Past Medical History:  Diagnosis Date   (HFpEF) heart failure with preserved ejection fraction (HCC) 03/01/2020   a.) TTE 03/01/2020: EF 55-60%, mod MAC, mod AoV sclerosis, triv AR, mild TR, mod MR, RVSP 50-59; b.) TTE 09/10/2020: EF 55-60%, mod MAC, mod AoV sclerosis, mild TR, 3+ MR, RVSP 50-59; c.) TTE 09/26/2020: EF 55-60%, mild LA dil, triv PR, mild MR/TR, RVSP 37-49   Adenoma of left adrenal gland    Anemia    Arthritis    Atrial fibrillation and flutter (HCC)    a.) CHA2DS2VASc = 5 (age x2, HFpEF, HTN, T2DM);  b.) s/p CTI ablation 09/07/2020; c.) rate/rhythm maintained on oral carvedilol; not on chronic anticoagulation therapy   CAD (coronary artery disease)    Cardiomegaly    CKD (chronic kidney disease), stage III (HCC)    DOE (dyspnea on exertion)    Drug-induced bradycardia    Gangrene of toe of left foot (HCC)    a.) 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    HLD (hyperlipidemia)    Hypertension    Long term current use of aspirin    Lymphedema of both lower extremities    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)    a.) s/p amputation 11/04/2022   Peripheral vascular disease (HCC)    Pleural effusion on right 09/09/2020   a.) s/p RIGHT thoracentesis with 2180 cc yield   Pneumonia    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   Sick sinus syndrome [facility])    a.) s/p MDT PPM placement 09/15/2020   T2DM (type 2 diabetes mellitus) (HCC)    Urinary retention    chronic, with indwelling Foley catheter and plans for a suprapubic   Wears dentures    full upper       Latest Ref Rng & Units 07/05/2023    5:02 AM 07/04/2023    3:33 AM 07/03/2023    4:38 AM  CBC  WBC 4.0 - 10.5 K/uL 8.3  8.2  7.9   Hemoglobin 13.0 - 17.0 g/dL 43.3  29.5  18.8   Hematocrit 39.0 - 52.0 %  34.7  34.4  33.8   Platelets 150 - 400 K/uL 223  225  219        Latest Ref Rng & Units 07/05/2023    5:02 AM 07/04/2023    3:33 AM 07/03/2023    4:38 AM  BMP  Glucose 70 - 99 mg/dL 409  811  914   BUN 8 - 23 mg/dL 37  33  33   Creatinine 0.61 - 1.24 mg/dL 7.82  9.56  2.13   Sodium 135 - 145 mmol/L 138  137  137   Potassium 3.5 - 5.1 mmol/L 4.1  3.8  4.1   Chloride 98 - 111 mmol/L 99  101  103   CO2 22 - 32 mmol/L 25  26  21    Calcium 8.9 - 10.3 mg/dL 9.0  8.6  8.5       Physical Exam: Lower Extremity Exam Vasc: Nonpalpable DP and PT pulses bilaterally.  Capillary refill time intact to the digits.  Skin temperature cool to touch.  Pedal hair growth absent  Derm: R -plantar hallux there is  ulceration which does probe at least to capsule.  This is approximately 2.5 cm in diameter.  Malodor is present.  No significant drainage present.  L -ulceration present to the plantar first TMT region with fat layer exposed.  This does not probe deep.  Serous drainage present.  No malodor appreciated.  Approximately 3 cm in diameter.  Pretibial ulceration present, full-thickness with subcutaneous tissue exposed.  MSK:  Status post multiple toe amputations bilaterally  Neuro: Light touch sensation diminished bilaterally.  Gross sensation intact.  Motor function intact         ASSESSMENT/PLAN OF CARE 77 y.o. male with PMHx significant for  hypertension, hyperlipidemia, diabetes, peripheral vascular disease, CHF CKD stage III, obesity, A-fib with diabetic foot ulcers bilaterally with concern for possible osteomyelitis right hallux.  -Diagnosis and plan of care discussed with patient -Betadine wet-to-dry dressings applied to the right foot, Gentamicin cream and  bandage applied to the wounds to the left lower extremity. -Patient does have history of revascularization back in February.  Will place repeat order for ABIs. -Do think that MR would be beneficial bilaterally if pacemaker can be interrogated. Ordering plain film radiographs - Continue IV abx broad spectrum pending further culture data - Anticoagulation: Lovenox - Wound care: Ok to continue with Gentamicin cream - Will continue to follow   Thank you for the consult.  Please contact me directly with any questions or concerns.           Bronwen Betters, [name] [facility]    [address] [place], [place] [address]                Office [phone]  Fax [phone]

## 2023-07-05 NOTE — Telephone Encounter (Signed)
Patient called and left message on my VM that he is in the hospital. Pt wants Dr Logan Bores to know and wants him to take care of his foot.

## 2023-07-05 NOTE — Consult Note (Addendum)
[facility] CLINIC CARDIOLOGY CONSULT NOTE       Patient ID: Tyler [name] MRN: [id number] DOB/AGE: [birth year] 77 y.o.  Admit date: 07/02/2023 Referring Physician Dr. Rosezetta Schlatter Primary Physician Elder Negus, NP Primary Cardiologist Lisabeth Pick, PA ([facility]) Reason for Consultation acute heart failure  HPI: Tyler [name] is a 77 y.o. male  with a past medical history of chronic HFpEF, persistent atrial fibrillation, mitral insufficiency, CHB s/p PPM 2021, hypertension, chronic kidney disease stage III who presented to the ED on 07/02/2023 for SOB and LE edema. Found to have newly reduced EF on echo. Cardiology was consulted for further evaluation.   Patient reports that over the last week he has noticed progressively worsening shortness of breath and leg swelling.  States that he has never had any issues with this before.  He does report that he has had orthopnea for many years.  Is unable to lay flat.  He states that over the last 1 to 2 days his shortness of breath/dyspnea on exertion and leg swelling got significantly worse and he decided to come to the ED for evaluation.  Workup in the ED notable for creatinine 1.45, potassium 4.1, hemoglobin 12.0.  BNP was elevated at 1066.9, troponins trended 24 > 27 > 28 > 27.  EKG in the ED demonstrated ventricular paced rhythm.  X-ray demonstrated mild vascular congestion and small bilateral pleural effusions.  He was started on IV Lasix 40 mg twice daily in the ED.  He was also placed on supplemental O2 in the ED.  Time of my evaluation today the patient is sitting upright in bedside chair.  Reports that he feels mildly improved from when he first came in.  Discussed his recent symptoms.  He reports he had significantly worsening dyspnea on exertion for 1 to 2 days prior to admission.  He denies any recent episodes of chest pain or palpitations.  His prior cardiac history was discussed in detail.  Ports that he had rheumatic fever  as a child, denies any prior valve surgeries.  Overall he states that he has been doing well throughout the last year without any cardiac issues up until about a week ago.  Patient reports that he lives in the area and would prefer to see a cardiologist locally than travel to [facility].  Review of systems complete and found to be negative unless listed above    Past Medical History:  Diagnosis Date   (HFpEF) heart failure with preserved ejection fraction (HCC) 03/01/2020   a.) TTE 03/01/2020: EF 55-60%, mod MAC, mod AoV sclerosis, triv AR, mild TR, mod MR, RVSP 50-59; b.) TTE 09/10/2020: EF 55-60%, mod MAC, mod AoV sclerosis, mild TR, 3+ MR, RVSP 50-59; c.) TTE 09/26/2020: EF 55-60%, mild LA dil, triv PR, mild MR/TR, RVSP 37-49   Adenoma of left adrenal gland    Anemia    Arthritis    Atrial fibrillation and flutter (HCC)    a.) CHA2DS2VASc = 5 (age x2, HFpEF, HTN, T2DM);  b.) s/p CTI ablation 09/07/2020; c.) rate/rhythm maintained on oral carvedilol; not on chronic anticoagulation therapy   CAD (coronary artery disease)    Cardiomegaly    CKD (chronic kidney disease), stage III (HCC)    DOE (dyspnea on exertion)    Drug-induced bradycardia    Gangrene of toe of left foot (HCC)    a.) 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 HEENT: Normocephalic and atraumatic. Neck: No JVD.  Lungs: Normal respiratory effort on 1L [place]. Clear bilaterally to auscultation. No wheezes, crackles, rhonchi.  Heart: HRRR. Normal S1 and S2 without gallops. + murmur.   Abdomen: Non-distended appearing.  Msk: Normal strength and tone for age. Extremities: Warm and well perfused. No clubbing, cyanosis.  Tense 2+ pitting edema bilaterally.  Neuro: Alert and oriented X 3. Psych: Answers questions appropriately.   Labs: Basic Metabolic Panel: Recent Labs    07/03/23 0438 07/04/23 0333 07/05/23 0502  NA 137 137 138  K 4.1 3.8 4.1  CL 103 101 99  CO2 21* 26 25  GLUCOSE 184* 165* 266*  BUN 33* 33* 37*  CREATININE 1.45* 1.48* 1.38*  CALCIUM 8.5* 8.6* 9.0  MG 2.4  --   --    Liver Function Tests: Recent Labs    07/02/23 1247  AST 27  ALT 26  ALKPHOS 69  BILITOT 1.0  PROT 7.4  ALBUMIN 3.5   No results for input(s): "LIPASE", "AMYLASE" in the last 72 hours. CBC: Recent Labs    07/04/23 0333 07/05/23 0502  WBC 8.2 8.3  NEUTROABS 5.6 5.8  HGB 11.2* 11.3*  HCT 34.4* 34.7*  MCV 93.7 93.5  PLT 225 223   Cardiac Enzymes: Recent Labs    07/02/23 2046 07/02/23 2305 07/03/23 0438  TROPONINIHS 27* 28* 27*   BNP: Recent Labs    07/02/23 1247  BNP 1,066.9*   D-Dimer: No results for input(s): "DDIMER" in the last 72 hours. Hemoglobin A1C: Recent Labs    07/02/23 2310  HGBA1C 7.2*   Fasting Lipid Panel: Recent Labs    07/03/23 0438  CHOL 148  HDL 31*  LDLCALC 95  TRIG 161  CHOLHDL 4.8   Thyroid Function Tests: No results for input(s): "TSH", "T4TOTAL", "T3FREE", "THYROIDAB" in the  last 72 hours.  Invalid input(s): "FREET3" Anemia Panel: No results for input(s): "VITAMINB12", "FOLATE", "FERRITIN", "TIBC", "IRON", "RETICCTPCT" in the last 72 hours.   Radiology: ECHOCARDIOGRAM COMPLETE  Result Date: 07/04/2023    ECHOCARDIOGRAM REPORT   Patient Name:   Tyler [name] Date of Exam: 07/03/2023 Medical Rec #:  [id number]      Height:       78.0 in Accession #:    [id number]     Weight:       260.0 lb Date of Birth:  Nov 08, 1945      BSA:          2.524 m Patient Age:    76 years       BP:           121/67 mmHg Patient Gender: M              HR:           107 bpm. Exam Location:  [facility] Procedure: 2D Echo, Cardiac Doppler and Color Doppler Indications:     I50.31 Acute Diastolic Heart Failure  History:         Patient has no prior history of Echocardiogram examinations.  [facility] CLINIC CARDIOLOGY CONSULT NOTE       Patient ID: Tyler [name] MRN: [id number] DOB/AGE: [birth year] 77 y.o.  Admit date: 07/02/2023 Referring Physician Dr. Rosezetta Schlatter Primary Physician Elder Negus, NP Primary Cardiologist Lisabeth Pick, PA ([facility]) Reason for Consultation acute heart failure  HPI: Tyler [name] is a 77 y.o. male  with a past medical history of chronic HFpEF, persistent atrial fibrillation, mitral insufficiency, CHB s/p PPM 2021, hypertension, chronic kidney disease stage III who presented to the ED on 07/02/2023 for SOB and LE edema. Found to have newly reduced EF on echo. Cardiology was consulted for further evaluation.   Patient reports that over the last week he has noticed progressively worsening shortness of breath and leg swelling.  States that he has never had any issues with this before.  He does report that he has had orthopnea for many years.  Is unable to lay flat.  He states that over the last 1 to 2 days his shortness of breath/dyspnea on exertion and leg swelling got significantly worse and he decided to come to the ED for evaluation.  Workup in the ED notable for creatinine 1.45, potassium 4.1, hemoglobin 12.0.  BNP was elevated at 1066.9, troponins trended 24 > 27 > 28 > 27.  EKG in the ED demonstrated ventricular paced rhythm.  X-ray demonstrated mild vascular congestion and small bilateral pleural effusions.  He was started on IV Lasix 40 mg twice daily in the ED.  He was also placed on supplemental O2 in the ED.  Time of my evaluation today the patient is sitting upright in bedside chair.  Reports that he feels mildly improved from when he first came in.  Discussed his recent symptoms.  He reports he had significantly worsening dyspnea on exertion for 1 to 2 days prior to admission.  He denies any recent episodes of chest pain or palpitations.  His prior cardiac history was discussed in detail.  Ports that he had rheumatic fever  as a child, denies any prior valve surgeries.  Overall he states that he has been doing well throughout the last year without any cardiac issues up until about a week ago.  Patient reports that he lives in the area and would prefer to see a cardiologist locally than travel to [facility].  Review of systems complete and found to be negative unless listed above    Past Medical History:  Diagnosis Date   (HFpEF) heart failure with preserved ejection fraction (HCC) 03/01/2020   a.) TTE 03/01/2020: EF 55-60%, mod MAC, mod AoV sclerosis, triv AR, mild TR, mod MR, RVSP 50-59; b.) TTE 09/10/2020: EF 55-60%, mod MAC, mod AoV sclerosis, mild TR, 3+ MR, RVSP 50-59; c.) TTE 09/26/2020: EF 55-60%, mild LA dil, triv PR, mild MR/TR, RVSP 37-49   Adenoma of left adrenal gland    Anemia    Arthritis    Atrial fibrillation and flutter (HCC)    a.) CHA2DS2VASc = 5 (age x2, HFpEF, HTN, T2DM);  b.) s/p CTI ablation 09/07/2020; c.) rate/rhythm maintained on oral carvedilol; not on chronic anticoagulation therapy   CAD (coronary artery disease)    Cardiomegaly    CKD (chronic kidney disease), stage III (HCC)    DOE (dyspnea on exertion)    Drug-induced bradycardia    Gangrene of toe of left foot (HCC)    a.) 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 HEENT: Normocephalic and atraumatic. Neck: No JVD.  Lungs: Normal respiratory effort on 1L [place]. Clear bilaterally to auscultation. No wheezes, crackles, rhonchi.  Heart: HRRR. Normal S1 and S2 without gallops. + murmur.   Abdomen: Non-distended appearing.  Msk: Normal strength and tone for age. Extremities: Warm and well perfused. No clubbing, cyanosis.  Tense 2+ pitting edema bilaterally.  Neuro: Alert and oriented X 3. Psych: Answers questions appropriately.   Labs: Basic Metabolic Panel: Recent Labs    07/03/23 0438 07/04/23 0333 07/05/23 0502  NA 137 137 138  K 4.1 3.8 4.1  CL 103 101 99  CO2 21* 26 25  GLUCOSE 184* 165* 266*  BUN 33* 33* 37*  CREATININE 1.45* 1.48* 1.38*  CALCIUM 8.5* 8.6* 9.0  MG 2.4  --   --    Liver Function Tests: Recent Labs    07/02/23 1247  AST 27  ALT 26  ALKPHOS 69  BILITOT 1.0  PROT 7.4  ALBUMIN 3.5   No results for input(s): "LIPASE", "AMYLASE" in the last 72 hours. CBC: Recent Labs    07/04/23 0333 07/05/23 0502  WBC 8.2 8.3  NEUTROABS 5.6 5.8  HGB 11.2* 11.3*  HCT 34.4* 34.7*  MCV 93.7 93.5  PLT 225 223   Cardiac Enzymes: Recent Labs    07/02/23 2046 07/02/23 2305 07/03/23 0438  TROPONINIHS 27* 28* 27*   BNP: Recent Labs    07/02/23 1247  BNP 1,066.9*   D-Dimer: No results for input(s): "DDIMER" in the last 72 hours. Hemoglobin A1C: Recent Labs    07/02/23 2310  HGBA1C 7.2*   Fasting Lipid Panel: Recent Labs    07/03/23 0438  CHOL 148  HDL 31*  LDLCALC 95  TRIG 161  CHOLHDL 4.8   Thyroid Function Tests: No results for input(s): "TSH", "T4TOTAL", "T3FREE", "THYROIDAB" in the  last 72 hours.  Invalid input(s): "FREET3" Anemia Panel: No results for input(s): "VITAMINB12", "FOLATE", "FERRITIN", "TIBC", "IRON", "RETICCTPCT" in the last 72 hours.   Radiology: ECHOCARDIOGRAM COMPLETE  Result Date: 07/04/2023    ECHOCARDIOGRAM REPORT   Patient Name:   Tyler [name] Date of Exam: 07/03/2023 Medical Rec #:  [id number]      Height:       78.0 in Accession #:    [id number]     Weight:       260.0 lb Date of Birth:  Nov 08, 1945      BSA:          2.524 m Patient Age:    76 years       BP:           121/67 mmHg Patient Gender: M              HR:           107 bpm. Exam Location:  [facility] Procedure: 2D Echo, Cardiac Doppler and Color Doppler Indications:     I50.31 Acute Diastolic Heart Failure  History:         Patient has no prior history of Echocardiogram examinations.  [facility] CLINIC CARDIOLOGY CONSULT NOTE       Patient ID: Tyler [name] MRN: [id number] DOB/AGE: [birth year] 77 y.o.  Admit date: 07/02/2023 Referring Physician Dr. Rosezetta Schlatter Primary Physician Elder Negus, NP Primary Cardiologist Lisabeth Pick, PA ([facility]) Reason for Consultation acute heart failure  HPI: Tyler [name] is a 77 y.o. male  with a past medical history of chronic HFpEF, persistent atrial fibrillation, mitral insufficiency, CHB s/p PPM 2021, hypertension, chronic kidney disease stage III who presented to the ED on 07/02/2023 for SOB and LE edema. Found to have newly reduced EF on echo. Cardiology was consulted for further evaluation.   Patient reports that over the last week he has noticed progressively worsening shortness of breath and leg swelling.  States that he has never had any issues with this before.  He does report that he has had orthopnea for many years.  Is unable to lay flat.  He states that over the last 1 to 2 days his shortness of breath/dyspnea on exertion and leg swelling got significantly worse and he decided to come to the ED for evaluation.  Workup in the ED notable for creatinine 1.45, potassium 4.1, hemoglobin 12.0.  BNP was elevated at 1066.9, troponins trended 24 > 27 > 28 > 27.  EKG in the ED demonstrated ventricular paced rhythm.  X-ray demonstrated mild vascular congestion and small bilateral pleural effusions.  He was started on IV Lasix 40 mg twice daily in the ED.  He was also placed on supplemental O2 in the ED.  Time of my evaluation today the patient is sitting upright in bedside chair.  Reports that he feels mildly improved from when he first came in.  Discussed his recent symptoms.  He reports he had significantly worsening dyspnea on exertion for 1 to 2 days prior to admission.  He denies any recent episodes of chest pain or palpitations.  His prior cardiac history was discussed in detail.  Ports that he had rheumatic fever  as a child, denies any prior valve surgeries.  Overall he states that he has been doing well throughout the last year without any cardiac issues up until about a week ago.  Patient reports that he lives in the area and would prefer to see a cardiologist locally than travel to [facility].  Review of systems complete and found to be negative unless listed above    Past Medical History:  Diagnosis Date   (HFpEF) heart failure with preserved ejection fraction (HCC) 03/01/2020   a.) TTE 03/01/2020: EF 55-60%, mod MAC, mod AoV sclerosis, triv AR, mild TR, mod MR, RVSP 50-59; b.) TTE 09/10/2020: EF 55-60%, mod MAC, mod AoV sclerosis, mild TR, 3+ MR, RVSP 50-59; c.) TTE 09/26/2020: EF 55-60%, mild LA dil, triv PR, mild MR/TR, RVSP 37-49   Adenoma of left adrenal gland    Anemia    Arthritis    Atrial fibrillation and flutter (HCC)    a.) CHA2DS2VASc = 5 (age x2, HFpEF, HTN, T2DM);  b.) s/p CTI ablation 09/07/2020; c.) rate/rhythm maintained on oral carvedilol; not on chronic anticoagulation therapy   CAD (coronary artery disease)    Cardiomegaly    CKD (chronic kidney disease), stage III (HCC)    DOE (dyspnea on exertion)    Drug-induced bradycardia    Gangrene of toe of left foot (HCC)    a.) 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  Admit date: 07/02/2023 Referring Physician Dr. Rosezetta Schlatter Primary Physician Elder Negus, NP Primary Cardiologist Lisabeth Pick, PA ([facility]) Reason for Consultation acute heart failure  HPI: Tyler [name] is a 77 y.o. male  with a past medical history of chronic HFpEF, persistent atrial fibrillation, mitral insufficiency, CHB s/p PPM 2021, hypertension, chronic kidney disease stage III who presented to the ED on 07/02/2023 for SOB and LE edema. Found to have newly reduced EF on echo. Cardiology was consulted for further evaluation.   Patient reports that over the last week he has noticed progressively worsening shortness of breath and leg swelling.  States that he has never had any issues with this before.  He does report that he has had orthopnea for many years.  Is unable to lay flat.  He states that over the last 1 to 2 days his shortness of breath/dyspnea on exertion and leg swelling got significantly worse and he decided to come to the ED for evaluation.  Workup in the ED notable for creatinine 1.45, potassium 4.1, hemoglobin 12.0.  BNP was elevated at 1066.9, troponins trended 24 > 27 > 28 > 27.  EKG in the ED demonstrated ventricular paced rhythm.  X-ray demonstrated mild vascular congestion and small bilateral pleural effusions.  He was started on IV Lasix 40 mg twice daily in the ED.  He was also placed on supplemental O2 in the ED.  Time of my evaluation today the patient is sitting upright in bedside chair.  Reports that he feels mildly improved from when he first came in.  Discussed his recent symptoms.  He reports he had significantly worsening dyspnea on exertion for 1 to 2 days prior to admission.  He denies any recent episodes of chest pain or palpitations.  His prior cardiac history was discussed in detail.  Ports that he had rheumatic fever  as a child, denies any prior valve surgeries.  Overall he states that he has been doing well throughout the last year without any cardiac issues up until about a week ago.  Patient reports that he lives in the area and would prefer to see a cardiologist locally than travel to [facility].  Review of systems complete and found to be negative unless listed above    Past Medical History:  Diagnosis Date   (HFpEF) heart failure with preserved ejection fraction (HCC) 03/01/2020   a.) TTE 03/01/2020: EF 55-60%, mod MAC, mod AoV sclerosis, triv AR, mild TR, mod MR, RVSP 50-59; b.) TTE 09/10/2020: EF 55-60%, mod MAC, mod AoV sclerosis, mild TR, 3+ MR, RVSP 50-59; c.) TTE 09/26/2020: EF 55-60%, mild LA dil, triv PR, mild MR/TR, RVSP 37-49   Adenoma of left adrenal gland    Anemia    Arthritis    Atrial fibrillation and flutter (HCC)    a.) CHA2DS2VASc = 5 (age x2, HFpEF, HTN, T2DM);  b.) s/p CTI ablation 09/07/2020; c.) rate/rhythm maintained on oral carvedilol; not on chronic anticoagulation therapy   CAD (coronary artery disease)    Cardiomegaly    CKD (chronic kidney disease), stage III (HCC)    DOE (dyspnea on exertion)    Drug-induced bradycardia    Gangrene of toe of left foot (HCC)    a.) 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 HEENT: Normocephalic and atraumatic. Neck: No JVD.  Lungs: Normal respiratory effort on 1L [place]. Clear bilaterally to auscultation. No wheezes, crackles, rhonchi.  Heart: HRRR. Normal S1 and S2 without gallops. + murmur.   Abdomen: Non-distended appearing.  Msk: Normal strength and tone for age. Extremities: Warm and well perfused. No clubbing, cyanosis.  Tense 2+ pitting edema bilaterally.  Neuro: Alert and oriented X 3. Psych: Answers questions appropriately.   Labs: Basic Metabolic Panel: Recent Labs    07/03/23 0438 07/04/23 0333 07/05/23 0502  NA 137 137 138  K 4.1 3.8 4.1  CL 103 101 99  CO2 21* 26 25  GLUCOSE 184* 165* 266*  BUN 33* 33* 37*  CREATININE 1.45* 1.48* 1.38*  CALCIUM 8.5* 8.6* 9.0  MG 2.4  --   --    Liver Function Tests: Recent Labs    07/02/23 1247  AST 27  ALT 26  ALKPHOS 69  BILITOT 1.0  PROT 7.4  ALBUMIN 3.5   No results for input(s): "LIPASE", "AMYLASE" in the last 72 hours. CBC: Recent Labs    07/04/23 0333 07/05/23 0502  WBC 8.2 8.3  NEUTROABS 5.6 5.8  HGB 11.2* 11.3*  HCT 34.4* 34.7*  MCV 93.7 93.5  PLT 225 223   Cardiac Enzymes: Recent Labs    07/02/23 2046 07/02/23 2305 07/03/23 0438  TROPONINIHS 27* 28* 27*   BNP: Recent Labs    07/02/23 1247  BNP 1,066.9*   D-Dimer: No results for input(s): "DDIMER" in the last 72 hours. Hemoglobin A1C: Recent Labs    07/02/23 2310  HGBA1C 7.2*   Fasting Lipid Panel: Recent Labs    07/03/23 0438  CHOL 148  HDL 31*  LDLCALC 95  TRIG 161  CHOLHDL 4.8   Thyroid Function Tests: No results for input(s): "TSH", "T4TOTAL", "T3FREE", "THYROIDAB" in the  last 72 hours.  Invalid input(s): "FREET3" Anemia Panel: No results for input(s): "VITAMINB12", "FOLATE", "FERRITIN", "TIBC", "IRON", "RETICCTPCT" in the last 72 hours.   Radiology: ECHOCARDIOGRAM COMPLETE  Result Date: 07/04/2023    ECHOCARDIOGRAM REPORT   Patient Name:   Tyler [name] Date of Exam: 07/03/2023 Medical Rec #:  [id number]      Height:       78.0 in Accession #:    [id number]     Weight:       260.0 lb Date of Birth:  Nov 08, 1945      BSA:          2.524 m Patient Age:    76 years       BP:           121/67 mmHg Patient Gender: M              HR:           107 bpm. Exam Location:  [facility] Procedure: 2D Echo, Cardiac Doppler and Color Doppler Indications:     I50.31 Acute Diastolic Heart Failure  History:         Patient has no prior history of Echocardiogram examinations.  [facility] CLINIC CARDIOLOGY CONSULT NOTE       Patient ID: Tyler [name] MRN: [id number] DOB/AGE: [birth year] 77 y.o.  Admit date: 07/02/2023 Referring Physician Dr. Rosezetta Schlatter Primary Physician Elder Negus, NP Primary Cardiologist Lisabeth Pick, PA ([facility]) Reason for Consultation acute heart failure  HPI: Tyler [name] is a 77 y.o. male  with a past medical history of chronic HFpEF, persistent atrial fibrillation, mitral insufficiency, CHB s/p PPM 2021, hypertension, chronic kidney disease stage III who presented to the ED on 07/02/2023 for SOB and LE edema. Found to have newly reduced EF on echo. Cardiology was consulted for further evaluation.   Patient reports that over the last week he has noticed progressively worsening shortness of breath and leg swelling.  States that he has never had any issues with this before.  He does report that he has had orthopnea for many years.  Is unable to lay flat.  He states that over the last 1 to 2 days his shortness of breath/dyspnea on exertion and leg swelling got significantly worse and he decided to come to the ED for evaluation.  Workup in the ED notable for creatinine 1.45, potassium 4.1, hemoglobin 12.0.  BNP was elevated at 1066.9, troponins trended 24 > 27 > 28 > 27.  EKG in the ED demonstrated ventricular paced rhythm.  X-ray demonstrated mild vascular congestion and small bilateral pleural effusions.  He was started on IV Lasix 40 mg twice daily in the ED.  He was also placed on supplemental O2 in the ED.  Time of my evaluation today the patient is sitting upright in bedside chair.  Reports that he feels mildly improved from when he first came in.  Discussed his recent symptoms.  He reports he had significantly worsening dyspnea on exertion for 1 to 2 days prior to admission.  He denies any recent episodes of chest pain or palpitations.  His prior cardiac history was discussed in detail.  Ports that he had rheumatic fever  as a child, denies any prior valve surgeries.  Overall he states that he has been doing well throughout the last year without any cardiac issues up until about a week ago.  Patient reports that he lives in the area and would prefer to see a cardiologist locally than travel to [facility].  Review of systems complete and found to be negative unless listed above    Past Medical History:  Diagnosis Date   (HFpEF) heart failure with preserved ejection fraction (HCC) 03/01/2020   a.) TTE 03/01/2020: EF 55-60%, mod MAC, mod AoV sclerosis, triv AR, mild TR, mod MR, RVSP 50-59; b.) TTE 09/10/2020: EF 55-60%, mod MAC, mod AoV sclerosis, mild TR, 3+ MR, RVSP 50-59; c.) TTE 09/26/2020: EF 55-60%, mild LA dil, triv PR, mild MR/TR, RVSP 37-49   Adenoma of left adrenal gland    Anemia    Arthritis    Atrial fibrillation and flutter (HCC)    a.) CHA2DS2VASc = 5 (age x2, HFpEF, HTN, T2DM);  b.) s/p CTI ablation 09/07/2020; c.) rate/rhythm maintained on oral carvedilol; not on chronic anticoagulation therapy   CAD (coronary artery disease)    Cardiomegaly    CKD (chronic kidney disease), stage III (HCC)    DOE (dyspnea on exertion)    Drug-induced bradycardia    Gangrene of toe of left foot (HCC)    a.) 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

## 2023-07-06 ENCOUNTER — Inpatient Hospital Stay: Payer: BC Managed Care – PPO

## 2023-07-06 DIAGNOSIS — L97512 Non-pressure chronic ulcer of other part of right foot with fat layer exposed: Secondary | ICD-10-CM | POA: Diagnosis not present

## 2023-07-06 DIAGNOSIS — L97522 Non-pressure chronic ulcer of other part of left foot with fat layer exposed: Secondary | ICD-10-CM

## 2023-07-06 DIAGNOSIS — I5033 Acute on chronic diastolic (congestive) heart failure: Secondary | ICD-10-CM | POA: Diagnosis not present

## 2023-07-06 LAB — CBC WITH DIFFERENTIAL/PLATELET
Abs Immature Granulocytes: 0.03 10*3/uL (ref 0.00–0.07)
Basophils Absolute: 0.1 10*3/uL (ref 0.0–0.1)
Basophils Relative: 1 %
Eosinophils Absolute: 0.3 10*3/uL (ref 0.0–0.5)
Eosinophils Relative: 5 %
HCT: 34 % — ABNORMAL LOW (ref 39.0–52.0)
Hemoglobin: 10.9 g/dL — ABNORMAL LOW (ref 13.0–17.0)
Immature Granulocytes: 0 %
Lymphocytes Relative: 16 %
Lymphs Abs: 1.1 10*3/uL (ref 0.7–4.0)
MCH: 30.1 pg (ref 26.0–34.0)
MCHC: 32.1 g/dL (ref 30.0–36.0)
MCV: 93.9 fL (ref 80.0–100.0)
Monocytes Absolute: 0.6 10*3/uL (ref 0.1–1.0)
Monocytes Relative: 9 %
Neutro Abs: 5 10*3/uL (ref 1.7–7.7)
Neutrophils Relative %: 69 %
Platelets: 208 10*3/uL (ref 150–400)
RBC: 3.62 MIL/uL — ABNORMAL LOW (ref 4.22–5.81)
RDW: 14.3 % (ref 11.5–15.5)
WBC: 7.2 10*3/uL (ref 4.0–10.5)
nRBC: 0 % (ref 0.0–0.2)

## 2023-07-06 LAB — BASIC METABOLIC PANEL
Anion gap: 10 (ref 5–15)
BUN: 36 mg/dL — ABNORMAL HIGH (ref 8–23)
CO2: 27 mmol/L (ref 22–32)
Calcium: 8.6 mg/dL — ABNORMAL LOW (ref 8.9–10.3)
Chloride: 101 mmol/L (ref 98–111)
Creatinine, Ser: 1.22 mg/dL (ref 0.61–1.24)
GFR, Estimated: >60 mL/min (ref >60)
Glucose, Bld: 171 mg/dL — ABNORMAL HIGH (ref 70–99)
Potassium: 3.6 mmol/L (ref 3.5–5.1)
Sodium: 138 mmol/L (ref 135–145)

## 2023-07-06 LAB — GLUCOSE, CAPILLARY
Glucose-Capillary: 160 mg/dL — ABNORMAL HIGH (ref 70–99)
Glucose-Capillary: 168 mg/dL — ABNORMAL HIGH (ref 70–99)
Glucose-Capillary: 215 mg/dL — ABNORMAL HIGH (ref 70–99)
Glucose-Capillary: 237 mg/dL — ABNORMAL HIGH (ref 70–99)

## 2023-07-06 MED ORDER — FUROSEMIDE 40 MG PO TABS
40.0000 mg | ORAL_TABLET | Freq: Every day | ORAL | Status: DC
  Administered 2023-07-07: 40 mg via ORAL
  Filled 2023-07-06: qty 1

## 2023-07-06 MED ORDER — ENOXAPARIN SODIUM 60 MG/0.6ML IJ SOSY
55.0000 mg | PREFILLED_SYRINGE | INTRAMUSCULAR | Status: DC
  Administered 2023-07-06: 55 mg via SUBCUTANEOUS
  Filled 2023-07-06: qty 0.6

## 2023-07-06 MED ORDER — FUROSEMIDE 40 MG PO TABS: 40.0000 mg | ORAL_TABLET | Freq: Every day | ORAL | Status: DC

## 2023-07-06 NOTE — Plan of Care (Signed)

## 2023-07-06 NOTE — Progress Notes (Signed)
[facility] Cardiology  SUBJECTIVE: Patient laying in bed, denies chest pain or shortness of breath   Vitals:   07/05/23 2031 07/05/23 2325 07/06/23 0534 07/06/23 0738  BP: 135/68 123/63 133/69 122/63  Pulse: 87  88 70  Resp: 18 (!) 21 18 16   Temp: 98.5 F (36.9 C) 97.6 F (36.4 C) 98.3 F (36.8 C) 98.6 F (37 C)  TempSrc: Oral Oral  Oral  SpO2: 97% 97% 96% 96%  Weight:   106.8 kg   Height:         Intake/Output Summary (Last 24 hours) at 07/06/2023 1110 Last data filed at 07/06/2023 0950 Gross per 24 hour  Intake 460 ml  Output 3050 ml  Net -2590 ml      PHYSICAL EXAM  General: Well developed, well nourished, in no acute distress HEENT:  Normocephalic and atramatic Neck:  No JVD.  Lungs: Clear bilaterally to auscultation and percussion. Heart: HRRR . Normal S1 and S2 without gallops or murmurs.  Abdomen: Bowel sounds are positive, abdomen soft and non-tender  Msk:  Back normal, normal gait. Normal strength and tone for age. Extremities: No clubbing, cyanosis or edema.   Neuro: Alert and oriented X 3. Psych:  Good affect, responds appropriately   LABS: Basic Metabolic Panel: Recent Labs    07/05/23 0502 07/06/23 0308  NA 138 138  K 4.1 3.6  CL 99 101  CO2 25 27  GLUCOSE 266* 171*  BUN 37* 36*  CREATININE 1.38* 1.22  CALCIUM 9.0 8.6*   Liver Function Tests: No results for input(s): "AST", "ALT", "ALKPHOS", "BILITOT", "PROT", "ALBUMIN" in the last 72 hours. No results for input(s): "LIPASE", "AMYLASE" in the last 72 hours. CBC: Recent Labs    07/05/23 0502 07/06/23 0308  WBC 8.3 7.2  NEUTROABS 5.8 5.0  HGB 11.3* 10.9*  HCT 34.7* 34.0*  MCV 93.5 93.9  PLT 223 208   Cardiac Enzymes: No results for input(s): "CKTOTAL", "CKMB", "CKMBINDEX", "TROPONINI" in the last 72 hours. BNP: Invalid input(s): "POCBNP" D-Dimer: No results for input(s): "DDIMER" in the last 72 hours. Hemoglobin A1C: No results for input(s): "HGBA1C" in the last 72 hours. Fasting  Lipid Panel: No results for input(s): "CHOL", "HDL", "LDLCALC", "TRIG", "CHOLHDL", "LDLDIRECT" in the last 72 hours. Thyroid Function Tests: No results for input(s): "TSH", "T4TOTAL", "T3FREE", "THYROIDAB" in the last 72 hours.  Invalid input(s): "FREET3" Anemia Panel: No results for input(s): "VITAMINB12", "FOLATE", "FERRITIN", "TIBC", "IRON", "RETICCTPCT" in the last 72 hours.  No results found.   Echo EF 30-35% 07/03/2023  TELEMETRY: Atrial sensing ventricular pacing 59 bpm:  ASSESSMENT AND PLAN:  Principal Problem:   Acute on chronic diastolic CHF (congestive heart failure) (HCC) Active Problems:   Persistent atrial fibrillation (HCC)   Essential hypertension   Obesity (BMI 30-39.9)   Diabetic foot ulcer (HCC)   Type II diabetes mellitus with renal manifestations (HCC)   Chronic kidney disease, stage 3a (HCC)   Myocardial injury   Cellulitis of right lower extremity   Ulcerated, foot, right, with fat layer exposed (HCC)   Ulcer of left foot with fat layer exposed (HCC)    1.  Acute on chronic HFrEF, EF 30-35%, improved on furosemide 40 mg IV, on good medical management (metoprolol succinate, spironolactone) 2.  Chronic atrial fibrillation, not on chronic anticoagulation open patient's prior history of bleeding 3.  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  Chronic kidney disease stage IIIb, BUN and creatinine 36 and 1.22  Recommendations  1.  Agree  with current therapy 2.  Continue diuresis 3.  Defer chronic anticoagulation at this time 4.  Transition to furosemide 40 mg p.o. daily 5.  Consider discharge later today 6.  Follow-up with me 1 to 2 weeks   Marcina Millard, MD, PhD, [facility] 07/06/2023 11:10 AM

## 2023-07-06 NOTE — Progress Notes (Signed)
Physical Therapy Treatment Patient Details Name: Tyler [name] MRN: [id number] DOB: [birth year] Today's Date: 07/06/2023   History of Present Illness Patient  77 y.o. 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 Current MD assessment: Acute on chronic CHF exacerbation, cellulitis of RLE, and diabetic foot ulcers.    PT Comments  Pt was supine in bed upon arrival. He agrees to session and is eager for OOB activity.  Lengthy education about post acute care and importance of monitoring his blood sugars. Per orders, pt remains NWB. He is non compliant and puts a lot of wt on Les even with using RW. Vcs to adhere to NWB but pt impulsively continues to ambulate non compliant. Author elected to limit gait due to pt's inability to NWB. DC recs remain appropriate due to pt's poor safety awareness and need for close medical following to safely manage current wounds on BLEs.    If plan is discharge home, recommend the following: A little help with walking and/or transfers;A little help with bathing/dressing/bathroom;Assistance with cooking/housework;Direct supervision/assist for medications management;Direct supervision/assist for financial management;Assist for transportation;Help with stairs or ramp for entrance     Equipment Recommendations  Rolling walker (2 wheels);Wheelchair (measurements PT);Wheelchair cushion (measurements PT)       Precautions / Restrictions Precautions Precautions: Fall Restrictions Weight Bearing Restrictions: Yes LLE Weight Bearing: Non weight bearing Other Position/Activity Restrictions: per orders NWB     Mobility  Bed Mobility Overal bed mobility:  Modified Independent Bed Mobility: Supine to Sit, Sit to Supine  Supine to sit: Supervision Sit to supine: Supervision  Transfers Overall transfer level: Needs assistance Equipment used: Rolling walker (2 wheels) Transfers: Sit to/from Stand Sit to Stand: Contact guard assist   Ambulation/Gait Ambulation/Gait assistance: Supervision Gait Distance (Feet): 5 Feet Assistive device: Rolling walker (2 wheels) Gait Pattern/deviations: Step-to pattern  General Gait Details: Pt impulsively stands and gstarts walking away form EOB. Max vcs for NWB but pt non compliant   Balance Overall balance assessment: Needs assistance Sitting-balance support: Feet supported Sitting balance-Leahy Scale: Normal     Standing balance support: Bilateral upper extremity supported, During functional activity Standing balance-Leahy Scale: Fair       Cognition Arousal: Alert Behavior During Therapy: WFL for tasks assessed/performed Overall Cognitive Status: Within Functional Limits for tasks assessed Area of Impairment: Safety/judgement    Safety/Judgement: Decreased awareness of deficits   Problem Solving: Requires verbal cues, Requires tactile cues General Comments: Pt is A and O x 4. Does not check BS regularly. poor ability to follow wt bearing restrictions           General Comments General comments (skin integrity, edema, etc.): Discussed at length needing to limit walking and adhere to NWB restrictions for proper wound healing. discussed import6ance of monitoring his blood sugars at DC      Pertinent Vitals/Pain Pain Assessment Pain Assessment: No/denies pain Faces Pain Scale: No hurt     PT Goals (current goals can now be found in the care plan section) Acute Rehab PT Goals Patient Stated Goal: get back to work Progress towards PT goals: Not progressing toward goals - comment (limited by NWB restrictions)    Frequency    Min 1X/week  AM-PAC PT "6 Clicks" Mobility    Outcome Measure  Help needed turning from your back to your side while in a flat bed without using bedrails?: None Help needed moving from lying on your back to sitting on the side of a flat bed without using bedrails?: None Help needed moving to and from a bed to a chair (including a wheelchair)?: A Little Help needed standing up from a chair using your arms (e.g., wheelchair or bedside chair)?: A Little Help needed to walk in hospital room?: A Little Help needed climbing 3-5 steps with a railing? : A Little 6 Click Score: 20    End of Session   Activity Tolerance: Patient tolerated treatment well;Other (comment) (session greatly limited by pt's non compliance with proper NWB) Patient left: in chair;with call bell/phone within reach;with chair alarm set Nurse Communication: Mobility status PT Visit Diagnosis: Other abnormalities of gait and mobility (R26.89);Difficulty in walking, not elsewhere classified (R26.2);Unsteadiness on feet (R26.81)     Time: 1336-1350 PT Time Calculation (min) (ACUTE ONLY): 14 min  Charges:    $Therapeutic Activity: 8-22 mins PT General Charges $$ ACUTE PT VISIT: 1 Visit                     Jetta Lout PTA 07/06/23, 3:18 PM

## 2023-07-06 NOTE — Progress Notes (Signed)
PODIATRY PROGRESS NOTE  NAME Tyler [name] MRN [id number] DOB 1945/10/08 DOA 07/02/2023   Reason for consult:  Chief Complaint  Patient presents with   Leg Swelling     History of present illness: 77 y.o. male with bilateral ulcerations.,  Denies any fevers or chills.  X-rays done this morning.  Vitals:   07/06/23 0534 07/06/23 0738  BP: 133/69 122/63  Pulse: 88 70  Resp: 18 16  Temp: 98.3 F (36.8 C) 98.6 F (37 C)  SpO2: 96% 96%       Latest Ref Rng & Units 07/06/2023    3:08 AM 07/05/2023    5:02 AM 07/04/2023    3:33 AM  CBC  WBC 4.0 - 10.5 K/uL 7.2  8.3  8.2   Hemoglobin 13.0 - 17.0 g/dL 28.4  13.2  44.0   Hematocrit 39.0 - 52.0 % 34.0  34.7  34.4   Platelets 150 - 400 K/uL 208  223  225        Latest Ref Rng & Units 07/06/2023    3:08 AM 07/05/2023    5:02 AM 07/04/2023    3:33 AM  BMP  Glucose 70 - 99 mg/dL 102  725  366   BUN 8 - 23 mg/dL 36  37  33   Creatinine 0.61 - 1.24 mg/dL 4.40  3.47  4.25   Sodium 135 - 145 mmol/L 138  138  137   Potassium 3.5 - 5.1 mmol/L 3.6  4.1  3.8   Chloride 98 - 111 mmol/L 101  99  101   CO2 22 - 32 mmol/L 27  25  26    Calcium 8.9 - 10.3 mg/dL 8.6  9.0  8.6       Physical Exam: General: Awake and alert oriented x 3.  NAD  Dermatology: Ulcerations present bilaterally as pictured below.  Ulceration present left plantar midfoot as well as anterior leg just proximal to the ankle.  Edema and erythema is noted.  There is no fluctuance or crepitation.  On the right foot ulceration present to the plantar aspect of the hallux.  It does not probe to bone at this time.  Edema present in the hallux.  Again no fluctuation or crepitation.          Vascular: Feet are warm and perfused.  Neurological: Decreased  Musculoskeletal Exam: No pain on exam    ASSESSMENT/PLAN OF CARE  -X-rays independently viewed.  No definitive evidence of acute osteomyelitis.  However I would recommend MRI but he has a pacemaker and I  contacted radiology and they are not able to do this.  I have ordered a CT scan of the bilateral lower extremities to further evaluate osteomyelitis.  Dressings were reapplied today.  Continue broad-spectrum antibiotics and we will follow-up with him tomorrow.  Podiatry will continue to follow. -ABI pending     Please contact me directly with any questions or concerns.     Ovid Curd, [name] [facility]  Dr. Lesia Sago. Jakeya Gherardi, [name]    [address] [place], [place] [address]                Office [phone]  Fax [phone]

## 2023-07-06 NOTE — Plan of Care (Signed)
  Problem: Education: Goal: Ability to describe self-care measures that may prevent or decrease complications (Diabetes Survival Skills Education) will improve Outcome: Progressing   Problem: Fluid Volume: Goal: Ability to maintain a balanced intake and output will improve Outcome: Progressing   Problem: Health Behavior/Discharge Planning: Goal: Ability to identify and utilize available resources and services will improve Outcome: Progressing Goal: Ability to manage health-related needs will improve Outcome: Progressing   Problem: Metabolic: Goal: Ability to maintain appropriate glucose levels will improve Outcome: Progressing   Problem: Skin Integrity: Goal: Risk for impaired skin integrity will decrease Outcome: Progressing   Problem: Tissue Perfusion: Goal: Adequacy of tissue perfusion will improve Outcome: Progressing   Problem: Health Behavior/Discharge Planning: Goal: Ability to manage health-related needs will improve Outcome: Progressing

## 2023-07-06 NOTE — Progress Notes (Addendum)
Progress Note   Patient: Tyler [name] ZOX:[id number] DOB: 02/24/1946 DOA: 07/02/2023     4 DOS: the patient was seen and examined on 07/06/2023     Subjective:  Patient seen and examined at bedside this morning Lower extremity swelling improving as well as shortness of breath Has been seen by podiatry today who has plan imaging of his legs with recommendation to continue IV antibiotics He denies nausea vomiting abdominal pain or chest pain   Brief hospital course: From HPI "Tyler [name] is a 77 y.o. 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  Patient has been having worsening bilateral leg edema in the past several days with associated shortness of breath that has progressively worsened. He has chronic diabetic foot ulcers and follows up with [name] podiatry. On arrival patient was found to have cellulitis involving the right lower extremity.  In the setting of elevated BNP of 1066.  Patient therefore admitted under hospitalist service for management of cellulitis as well as acute on chronic diastolic heart failure "   Assessment and Plan:   Acute hypoxic respiratory failure secondary to acute on chronic diastolic CHF (congestive heart failure) [facility]): Patient has leg edema, significantly elevated BNP 1066, vascular congestion on chest x-ray, clinically consistent with CHF exacerbation.  2D echo 09/26/2020 showed EF of 55 to 60%. Continue IV diuresis with IV Lasix 40 mg twice daily Cardiologist has been consulted and case discussed Echo showing EF of 30 to 35% Continue input and output monitoring Continue low-salt diet Continue fluid restriction to 1.5  L/day Continue Daily weighing   Myocardial injury:  Echocardiogram showing EF of 30 to 35% -Trend troponin -Aspirin A1c of 7.2   Persistent atrial fibrillation [facility]):  Patient is not taking anticoagulants.  Heart rate 90-110s    Essential hypertension Continue Lasix and Coreg Continue blood pressure monitoring as well as as needed hydralazine   Type II diabetes mellitus with renal manifestations (HCC):  Recent A1c 6.4, well-controlled.   Patient is taking metformin and glipizide at home Continue sliding scale   Chronic kidney disease, stage 3a [facility]):  Renal function close to baseline.   Recent baseline creatinine 1.3-1.4.   His creatinine is 1.45, BUN 36, GFR 49 Continue to monitor renal function closely   Cellulitis of right lower extremity:  Patient has chronic bilateral lower leg erythema, but he has warmth in the right lower leg on touch, indicating possible cellulitis. Continue ceftriaxone Doppler ultrasound did not show any evidence of DVT  Has been seen by podiatry today who has plan imaging of his legs with recommendation to continue IV antibiotics Plan of care discussed with podiatry  Diabetic foot ulcer (HCC): Chronic bilateral foot ulcers.  Patient following up with with [name] of podiatry, currently on Augmentin.   Continue ceftriaxone Patient follows up with [name] of for podiatry Wound care on board we appreciate input Continue PT OT   Obesity (BMI 30-39.9): Body weight 117.9 kg, BMI 30.05 -Encourage losing weight -Exercise and healthy diet      DVT ppx: SQ Lovenox   Code Status: Full code     Consults called:  none   Data Reviewed: I have reviewed podiatry documentation, cardiologist documentation,  PT OT notes, below mentioned labs as well as vitals Family Communication: No family at bedside   Disposition: Status is: Inpatient    Planned Discharge Destination: Skilled nursing facility   Time spent: 40 minutes   Physical  Exam: General: Elderly male sitting up in a chair Heme: No neck lymph node enlargement. Cardiac: S1/S2, RRR, No murmurs, No gallops or rubs. Respiratory: Bilateral basal crackles GI: Soft, nondistended, nontender Ext: 2+ pitting leg edema bilaterally. 1+DP/PT pulse bilaterally. Musculoskeletal: Erythema noted to be more pronounced on the right lower extremity below the knee CNS: Alert and oriented     Latest Ref Rng & Units 07/06/2023    3:08 AM 07/05/2023    5:02 AM 07/04/2023    3:33 AM  CBC  WBC 4.0 - 10.5 K/uL 7.2  8.3  8.2   Hemoglobin 13.0 - 17.0 g/dL 65.7  84.6  96.2   Hematocrit 39.0 - 52.0 % 34.0  34.7  34.4   Platelets 150 - 400 K/uL 208  223  225        Latest Ref Rng & Units 07/06/2023    3:08 AM 07/05/2023    5:02 AM 07/04/2023    3:33 AM  BMP  Glucose 70 - 99 mg/dL 952  841  324   BUN 8 - 23 mg/dL 36  37  33   Creatinine 0.61 - 1.24 mg/dL 4.01  0.27  2.53   Sodium 135 - 145 mmol/L 138  138  137   Potassium 3.5 - 5.1 mmol/L 3.6  4.1  3.8   Chloride 98 - 111 mmol/L 101  99  101   CO2 22 - 32 mmol/L 27  25  26    Calcium 8.9 - 10.3 mg/dL 8.6  9.0  8.6     Vitals:   07/05/23 2325 07/06/23 0534 07/06/23 0738 07/06/23 1236  BP: 123/63 133/69 122/63 (!) 123/58  Pulse:  88 70 75  Resp: (!) 21 18 16 16   Temp: 97.6 F (36.4 C) 98.3 F (36.8 C) 98.6 F (37 C) 98.4 F (36.9 C)  TempSrc: Oral  Oral Oral  SpO2: 97% 96% 96% 95%  Weight:  106.8 kg    Height:         Author: Loyce Dys, MD 07/06/2023 3:10 PM  For on call review [email or web address].

## 2023-07-06 NOTE — Progress Notes (Signed)
PT Cancellation Note  Patient Details Name: CHEO SELVEY MRN: [id number] DOB: Mar 29, 1946   Cancelled Treatment:    Reason Eval/Treat Not Completed: Patient at procedure or test/unavailable. Pt off floor for imaging. Per RN, pt left the floor around 0945. Will follow up with PT intervention later today, as appropriate and with time permitting.   Vira Blanco, PT, DPT 10:53 AM,07/06/23 Physical Therapist - [facility]

## 2023-07-07 DIAGNOSIS — I5033 Acute on chronic diastolic (congestive) heart failure: Secondary | ICD-10-CM | POA: Diagnosis not present

## 2023-07-07 DIAGNOSIS — L97512 Non-pressure chronic ulcer of other part of right foot with fat layer exposed: Secondary | ICD-10-CM | POA: Diagnosis not present

## 2023-07-07 DIAGNOSIS — E11621 Type 2 diabetes mellitus with foot ulcer: Secondary | ICD-10-CM

## 2023-07-07 DIAGNOSIS — L97522 Non-pressure chronic ulcer of other part of left foot with fat layer exposed: Secondary | ICD-10-CM | POA: Diagnosis not present

## 2023-07-07 LAB — CBC WITH DIFFERENTIAL/PLATELET
Abs Immature Granulocytes: 0.05 10*3/uL (ref 0.00–0.07)
Basophils Absolute: 0.1 10*3/uL (ref 0.0–0.1)
Basophils Relative: 1 %
Eosinophils Absolute: 0.4 10*3/uL (ref 0.0–0.5)
Eosinophils Relative: 6 %
HCT: 33.7 % — ABNORMAL LOW (ref 39.0–52.0)
Hemoglobin: 11.1 g/dL — ABNORMAL LOW (ref 13.0–17.0)
Immature Granulocytes: 1 %
Lymphocytes Relative: 19 %
Lymphs Abs: 1.5 10*3/uL (ref 0.7–4.0)
MCH: 30.2 pg (ref 26.0–34.0)
MCHC: 32.9 g/dL (ref 30.0–36.0)
MCV: 91.6 fL (ref 80.0–100.0)
Monocytes Absolute: 0.8 10*3/uL (ref 0.1–1.0)
Monocytes Relative: 11 %
Neutro Abs: 5 10*3/uL (ref 1.7–7.7)
Neutrophils Relative %: 62 %
Platelets: 238 10*3/uL (ref 150–400)
RBC: 3.68 MIL/uL — ABNORMAL LOW (ref 4.22–5.81)
RDW: 14.3 % (ref 11.5–15.5)
WBC: 7.8 10*3/uL (ref 4.0–10.5)
nRBC: 0 % (ref 0.0–0.2)

## 2023-07-07 LAB — CULTURE, BLOOD (ROUTINE X 2)
Culture: NO GROWTH
Culture: NO GROWTH

## 2023-07-07 LAB — BASIC METABOLIC PANEL
Anion gap: 12 (ref 5–15)
BUN: 37 mg/dL — ABNORMAL HIGH (ref 8–23)
CO2: 26 mmol/L (ref 22–32)
Calcium: 9 mg/dL (ref 8.9–10.3)
Chloride: 100 mmol/L (ref 98–111)
Creatinine, Ser: 1.25 mg/dL — ABNORMAL HIGH (ref 0.61–1.24)
GFR, Estimated: 59 mL/min — ABNORMAL LOW (ref >60)
Glucose, Bld: 140 mg/dL — ABNORMAL HIGH (ref 70–99)
Potassium: 3.9 mmol/L (ref 3.5–5.1)
Sodium: 138 mmol/L (ref 135–145)

## 2023-07-07 LAB — GLUCOSE, CAPILLARY
Glucose-Capillary: 155 mg/dL — ABNORMAL HIGH (ref 70–99)
Glucose-Capillary: 205 mg/dL — ABNORMAL HIGH (ref 70–99)

## 2023-07-07 MED ORDER — DICLOFENAC SODIUM 1 % EX GEL: 2.0000 g | Freq: Four times a day (QID) | CUTANEOUS | 0 refills | Status: DC

## 2023-07-07 MED ORDER — SPIRONOLACTONE 25 MG PO TABS: 12.5000 mg | ORAL_TABLET | Freq: Every day | ORAL | 0 refills | Status: DC

## 2023-07-07 MED ORDER — DICLOFENAC SODIUM 1 % EX GEL
2.0000 g | Freq: Four times a day (QID) | CUTANEOUS | Status: DC
  Filled 2023-07-07: qty 100

## 2023-07-07 MED ORDER — METOPROLOL SUCCINATE ER 50 MG PO TB24: 50.0000 mg | ORAL_TABLET | Freq: Every day | ORAL | 0 refills | Status: DC

## 2023-07-07 MED ORDER — AMOXICILLIN-POT CLAVULANATE 875-125 MG PO TABS: 1.0000 | ORAL_TABLET | Freq: Two times a day (BID) | ORAL | 0 refills | Status: DC

## 2023-07-07 MED ORDER — CHLORHEXIDINE GLUCONATE CLOTH 2 % EX PADS: 6.0000 | MEDICATED_PAD | Freq: Every day | CUTANEOUS | Status: DC

## 2023-07-07 NOTE — Progress Notes (Signed)
Tyler [name] Ask to be D/C'd home per MD order. Discussed with the patient and all questions fully answered.  Skin clean and dry, dressings to bilateral feet changed by podiatry this afternoon. Dressing supplies sent with patient. Crutches and two post-op shoes delivered to room. IV catheter discontinued intact. Site without signs and symptoms of complications. Dressing and pressure applied.  An After Visit Summary was printed and given to the patient.  Patient escorted via WC, and D/C home via private auto.  [name]  07/07/2023 3:56 PM

## 2023-07-07 NOTE — TOC Transition Note (Signed)
Transition of Care [facility]) - CM/SW Discharge Note   Patient Details  Name: Tyler [name] MRN: [id number] Date of Birth: 1946-05-30  Transition of Care [facility]) CM/SW Contact:  Bing Quarry, RN Phone Number: 07/07/2023, 12:29 PM   Clinical Narrative:   0/13: Admitted 10/8 with chief complaint of swelling in both legs and some congestins. DX: Acute on Chronic HF, cellulitis or RLE, and diabetic foot ulcers, s/p multiple toe amputations bilaterally. Podiatry consulted, cardiology consulted. Chronic Foley changed on 07/04/23 while in acute care setting at [facility]. Htx of dual chamber pacemaker for complete heart block 08/2020.   Patient has discharge orders in for today to home/self care after declining SNF/STR/HH. Crutches and post op shoe ordered by provider. Post up shoe not provided by [employer]. Crutches ordered so PT can show patient proper use prior to discharge if possible. Updated RN that post op shoe not provided by [employer], but PT has in the past.    Gabriel Cirri MSN RN CM  Transitions of Care Department [facility] [phone] Weekends Only      Final next level of care: Home/Self Care Barriers to Discharge: No Barriers Identified   Patient Goals and CMS Choice CMS Medicare.gov Compare Post Acute Care list provided to:: Patient Choice offered to / list presented to : Patient  Discharge Placement                         Discharge Plan and Services Additional resources added to the After Visit Summary for                  DME Arranged: Crutches DME Agency: [employer] Date DME Agency Contacted: 07/07/23 Time DME Agency Contacted: 1229 Representative spoke with at DME Agency: Selena Batten (on call rep) HH Arranged: NA, Patient Refused HH HH Agency: NA        Social Determinants of Health (SDOH) Interventions SDOH Screenings   Food Insecurity: No Food Insecurity (03/20/2023)   Received from [facility], [facility]  Housing: Low Risk  (10/30/2022)  Transportation  Needs: No Transportation Needs (03/20/2023)   Received from [facility], [facility]  Utilities: Not At Risk (03/20/2023)   Received from [facility], [facility]  Financial Resource Strain: Low Risk  (03/20/2023)   Received from [facility], [facility]  Social Connections: Unknown (02/01/2022)   Received from [facility], [facility]  Stress: No Stress Concern Present (09/07/2020)   Received from [facility], [facility]  Tobacco Use: Low Risk  (05/03/2023)     Readmission Risk Interventions     No data to display

## 2023-07-07 NOTE — Progress Notes (Signed)
[facility]  SUBJECTIVE: Patient laying in bed, denies chest pain or shortness of breath   Vitals:   07/06/23 1644 07/06/23 2000 07/07/23 0400 07/07/23 0743  BP: 125/71 125/81 126/72 121/60  Pulse: 72 82 67 86  Resp: 17   18  Temp: 98.5 F (36.9 C) 98.7 F (37.1 C) 98.4 F (36.9 C) 97.8 F (36.6 C)  TempSrc: Oral Oral Oral Oral  SpO2: 100% 99% (!) 67% 94%  Weight:      Height:         Intake/Output Summary (Last 24 hours) at 07/07/2023 0933 Last data filed at 07/07/2023 0449 Gross per 24 hour  Intake 694 ml  Output 1850 ml  Net -1156 ml      PHYSICAL EXAM  General: Well developed, well nourished, in no acute distress HEENT:  Normocephalic and atramatic Neck:  No JVD.  Lungs: Clear bilaterally to auscultation and percussion. Heart: HRRR . Normal S1 and S2 without gallops or murmurs.  Abdomen: Bowel sounds are positive, abdomen soft and non-tender  Msk:  Back normal, normal gait. Normal strength and tone for age. Extremities: No clubbing, cyanosis or edema.   Neuro: Alert and oriented X 3. Psych:  Good affect, responds appropriately   LABS: Basic Metabolic Panel: Recent Labs    07/06/23 0308 07/07/23 0244  NA 138 138  K 3.6 3.9  CL 101 100  CO2 27 26  GLUCOSE 171* 140*  BUN 36* 37*  CREATININE 1.22 1.25*  CALCIUM 8.6* 9.0   Liver Function Tests: No results for input(s): "AST", "ALT", "ALKPHOS", "BILITOT", "PROT", "ALBUMIN" in the last 72 hours. No results for input(s): "LIPASE", "AMYLASE" in the last 72 hours. CBC: Recent Labs    07/06/23 0308 07/07/23 0244  WBC 7.2 7.8  NEUTROABS 5.0 5.0  HGB 10.9* 11.1*  HCT 34.0* 33.7*  MCV 93.9 91.6  PLT 208 238   Cardiac Enzymes: No results for input(s): "CKTOTAL", "CKMB", "CKMBINDEX", "TROPONINI" in the last 72 hours. BNP: Invalid input(s): "POCBNP" D-Dimer: No results for input(s): "DDIMER" in the last 72 hours. Hemoglobin A1C: No results for input(s): "HGBA1C" in the last 72 hours. Fasting  Lipid Panel: No results for input(s): "CHOL", "HDL", "LDLCALC", "TRIG", "CHOLHDL", "LDLDIRECT" in the last 72 hours. Thyroid Function Tests: No results for input(s): "TSH", "T4TOTAL", "T3FREE", "THYROIDAB" in the last 72 hours.  Invalid input(s): "FREET3" Anemia Panel: No results for input(s): "VITAMINB12", "FOLATE", "FERRITIN", "TIBC", "IRON", "RETICCTPCT" in the last 72 hours.  US ARTERIAL LOWER EXTREMITY DUPLEX BILATERAL  Result Date: 07/07/2023 CLINICAL DATA:  Bilateral diabetic foot ulcer Hypertension EXAM: BILATERAL LOWER EXTREMITY ARTERIAL DUPLEX SCAN TECHNIQUE: Gray-scale sonography as well as color Doppler and duplex ultrasound was performed to evaluate the arteries of both lower extremities including the common, superficial and profunda femoral arteries, popliteal artery and calf arteries. COMPARISON:  None Available. FINDINGS: Right Lower Extremity Inflow: Normal common femoral arterial waveforms and velocities. No evidence of inflow (aortoiliac) disease. Outflow: Monophasic waveform seen in the superficial femoral and popliteal arteries. Biphasic waveform seen in the the femoral artery. There is significant elevation of velocity between the proximal and distal superficial femoral arteries consistent with stenosis. Runoff: Anterior and posterior tibial artery waveforms are monophasic. Left Lower Extremity Inflow: Normal common femoral arterial waveforms and velocities. No evidence of inflow (aortoiliac) disease. Outflow: Superficial femoral and popliteal artery waveforms are monophasic. Profunda femoris artery is triphasic. No focal elevation of velocity. Runoff: Anterior and posterior tibial arteries are monophasic. IMPRESSION: 1. Diffuse monophasic waveforms of  the right lower extremity are consistent with significant arterial occlusive disease. The site of stenosis most likely at the level of the mid superficial femoral artery. 2. Diffuse monophasic waveforms of the left lower extremity are  consistent with significant arterial occlusive disease with stenosis likely located in the proximal SFA. 3. Further evaluation with CT angiography of the lower extremities would be beneficial. Electronically Signed   By: Acquanetta Belling M.D.   On: 07/07/2023 08:04   CT FOOT RIGHT WO CONTRAST  Result Date: 07/06/2023 CLINICAL DATA:  Plantar great toe ulcer. History of prior amputation. EXAM: CT OF THE RIGHT FOOT WITHOUT CONTRAST TECHNIQUE: Multidetector CT imaging of the right foot was performed according to the standard protocol. Multiplanar CT image reconstructions were also generated. RADIATION DOSE REDUCTION: This exam was performed according to the departmental dose-optimization program which includes automated exposure control, adjustment of the mA and/or kV according to patient size and/or use of iterative reconstruction technique. COMPARISON:  Right foot x-rays from same day. CT right foot dated March 01, 2023. FINDINGS: Bones/Joint/Cartilage No bony destruction or periosteal reaction. Prior third ray amputation. Prior amputation of the distal fourth metatarsal. No fracture or dislocation. 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 No joint effusion. Ligaments Ligaments are suboptimally evaluated by CT. Muscles and Tendons Grossly intact.  Atrophy of the intrinsic foot muscles. Soft tissue Diffuse soft tissue swelling. Superficial ulceration along the plantar aspect of the first IP joint. No fluid collection or subcutaneous emphysema. No soft tissue mass. IMPRESSION: 1. Superficial ulceration along the plantar aspect of the first IP joint. No CT evidence of osteomyelitis. 2. Unchanged severe first MTP joint osteoarthritis. 3. 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 Electronically Signed   By: [name] M.D.   On: 07/06/2023 11:48   DG Foot Complete Right  Result Date: 07/06/2023 CLINICAL DATA:  Plantar great toe ulcer. History of prior  amputation. EXAM: RIGHT FOOT COMPLETE - 3+ VIEW COMPARISON:  Right foot x-rays dated May 03, 2023. FINDINGS: No bony destruction or periosteal reaction. Prior third ray amputation. Prior amputation of the distal fourth metatarsal. No acute fracture or dislocation. Unchanged severe first MTP joint osteoarthritis. New superficial ulceration along the plantar aspect great toe. Diffuse soft tissue swelling. IMPRESSION: 1. New superficial ulceration along the plantar aspect of the great toe. No radiographic evidence of osteomyelitis. Electronically Signed   By: [name] M.D.   On: 07/06/2023 11:42   CT FOOT LEFT WO CONTRAST  Result Date: 07/06/2023 CLINICAL DATA:  Medial plantar foot ulcer. History of prior amputation. EXAM: CT OF THE LEFT FOOT WITHOUT CONTRAST TECHNIQUE: Multidetector CT imaging of the left foot was performed according to the standard protocol. Multiplanar CT image reconstructions were also generated. RADIATION DOSE REDUCTION: This exam was performed according to the departmental dose-optimization program which includes automated exposure control, adjustment of the mA and/or kV according to patient size and/or use of iterative reconstruction technique. COMPARISON:  Left foot x-rays from same day. CT left foot dated July 02, 2023. FINDINGS: Bones/Joint/Cartilage No acute bony destruction or periosteal reaction. 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 No acute fracture or dislocation. Degenerative and posttraumatic changes of the distal tibia fibula with mild widening of the medial clear space. 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 Mild midfoot degenerative changes. Pes planovalgus. Osteopenia. No joint effusion. Ligaments Ligaments are suboptimally evaluated by CT. Muscles and Tendons Grossly intact.  Atrophy of the intrinsic foot muscles. Soft tissue Diffuse soft tissue swelling.  Unchanged superficial ulceration along the medial plantar aspect of the midfoot near the base of the medial cuneiform. No fluid collection or subcutaneous emphysema. No soft tissue mass. IMPRESSION: 1. Unchanged superficial ulceration along the medial plantar aspect of the midfoot near the base of the medial cuneiform. No CT evidence of osteomyelitis. 2. Prior first ray amputation. Electronically Signed   By: [name] M.D.   On: 07/06/2023 11:40   DG Foot Complete Left  Result Date: 07/06/2023 CLINICAL DATA:  Left foot ulcer. EXAM: LEFT FOOT - COMPLETE 3+ VIEW COMPARISON:  CT left foot dated July 02, 2023. Left foot x-rays dated June 25, 2023. MRI left foot dated July 04, 2018. FINDINGS: Prior first ray amputation with heterotopic ossification. Chronic flattening of the second and third metatarsal heads. No acute fracture or dislocation. Bone mineralization is normal. Forefoot soft tissue swelling with superficial ulceration along the medial midfoot. IMPRESSION: 1. Forefoot soft tissue swelling with superficial ulceration along the medial midfoot. No radiographic evidence of osteomyelitis. Electronically Signed   By: [name] M.D.   On: 07/06/2023 11:29     Echo LVEF 30-35% 07/03/2023  TELEMETRY: Atrial sensing ventricular pacing 88 bpm:  ASSESSMENT AND PLAN:  Principal Problem:   Acute on chronic diastolic CHF (congestive heart failure) (HCC) Active Problems:   Persistent atrial fibrillation (HCC)   Essential hypertension   Obesity (BMI 30-39.9)   Diabetic foot ulcer (HCC)   Type II diabetes mellitus with renal manifestations (HCC)   Chronic kidney disease, stage 3a (HCC)   Myocardial injury   Cellulitis of right lower extremity   Ulcerated, foot, right, with fat layer exposed (HCC)   Ulcer of left foot with fat layer exposed (HCC)    1.  Acute on chronic HFrEF, EF 30-35%, improved on furosemide 40 mg IV, on good medical management (metoprolol succinate,  spironolactone, furosemide) 2.  Chronic atrial fibrillation, not on chronic anticoagulation, can be readdressed as outpatient (patient previously followed by [facility] cardiology, with plans to follow-up with me in 1 to 2 weeks) 3.  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  Chronic kidney disease stage IIIb, BUN and creatinine 36 and 1.22 5.  Right lower extremity cellulitis, diabetic foot ulcer, followed by podiatry   Recommendations   1.  Agree with current therapy 2.  Continue diuresis 3.  Defer chronic anticoagulation at this time 4.  Continue furosemide 40 mg p.o. daily 5.  Closely monitor renal status 6.  Follow-up with me 1 to 2 weeks  Sign off for now, please call if any questions   Marcina Millard, MD, PhD, [facility] 07/07/2023 9:33 AM

## 2023-07-07 NOTE — Discharge Summary (Signed)
the distal tibia fibula with mild  widening of the medial clear space. 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 Mild midfoot degenerative changes. Pes planovalgus. Osteopenia. No joint effusion. Ligaments Ligaments are suboptimally evaluated by CT. Muscles and Tendons Grossly intact.  Atrophy of the intrinsic foot muscles. Soft tissue Diffuse soft tissue swelling. Superficial ulceration along the medial plantar aspect of the midfoot near the base of the medial cuneiform. No fluid collection or subcutaneous emphysema. No soft tissue mass. IMPRESSION: 1. Superficial ulceration along the medial plantar aspect of the midfoot near the base of the medial cuneiform. No CT evidence of osteomyelitis. 2. Prior first ray amputation. Electronically Signed   By: [name] M.D.   On: 07/06/2023 11:35   DG Foot Complete Left  Result Date: 07/06/2023 CLINICAL DATA:  Left foot ulcer. EXAM: LEFT FOOT - COMPLETE 3+ VIEW COMPARISON:  CT left foot dated July 02, 2023. Left foot x-rays dated June 25, 2023. MRI left foot dated July 04, 2018. FINDINGS: Prior first ray amputation with heterotopic ossification. Chronic flattening of the second and third metatarsal heads. No acute fracture or dislocation. Bone mineralization is normal. Forefoot soft tissue swelling with superficial ulceration along the medial midfoot. IMPRESSION: 1. Forefoot soft tissue swelling with superficial ulceration along the medial midfoot. No radiographic evidence of osteomyelitis. Electronically Signed   By: [name] M.D.   On: 07/06/2023 11:29   ECHOCARDIOGRAM COMPLETE  Result Date: 07/04/2023    ECHOCARDIOGRAM REPORT   Patient Name:   Tyler [name] Date of Exam: 07/03/2023 Medical Rec #:  [id number]      Height:       78.0 in Accession #:    [id number]     Weight:       260.0 lb Date of Birth:  Oct 20, 1945      BSA:          2.524 m Patient Age:    77 years       BP:           121/67 mmHg  Patient Gender: M              HR:           107 bpm. Exam Location:  [facility] Procedure: 2D Echo, Cardiac Doppler and Color Doppler Indications:     I50.31 Acute Diastolic Heart Failure  History:         Patient has no prior history of Echocardiogram examinations.                  Cardiomegaly, CAD, Arrythmias:Atrial Fibrillation,                  Signs/Symptoms:Dyspnea; Risk Factors:Hypertension, Dyslipidemia                  and Diabetes. Lower extremity edema. Pulmonary hypertension.                  History of Rheumatic Fever.  Sonographer:     Daphine Deutscher RDCS Referring Phys:  [id number] Brien Few NIU Diagnosing Phys: Marcina Millard MD IMPRESSIONS  1. Left ventricular ejection fraction, by estimation, is 30 to 35%. The left ventricle has moderately decreased function. The left ventricle demonstrates regional wall motion abnormalities (see scoring diagram/findings for description). Indeterminate diastolic filling due to E-A fusion.  2. Right ventricular systolic function is normal. The right ventricular size is normal.  3. 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  Patient has been having worsening bilateral leg edema in the past several days with associated shortness of breath that has progressively worsened. He has chronic diabetic foot ulcers and follows up with [name] podiatry. On arrival patient was found to have cellulitis involving the right lower extremity.  In the setting of elevated BNP of 1066.  Echo showed EF 30 to 35%, patient was seen by cardiologist and heart failure medication adjusted with plans to follow-up at the clinic for maximal optimization.  Patient was also seen by podiatrist underwent bilateral lower extremity imaging however they did not show any evidence of underlying osteomyelitis.  Patient has been cleared by podiatrist today for discharge on oral antibiotics and to follow-up as an outpatient.  Consultants: Cardiology,  podiatry Procedures performed: None Disposition: Home Diet recommendation:  Cardiac diet DISCHARGE MEDICATION: Allergies as of 07/07/2023       Reactions   Eliquis [apixaban]    Dizziness  and vision change        Medication List     STOP taking these medications    carvedilol 6.25 MG tablet Commonly known as: COREG   lisinopril 40 MG tablet Commonly known as: ZESTRIL   metolazone 2.5 MG tablet Commonly known as: ZAROXOLYN       TAKE these medications    amoxicillin-clavulanate 875-125 MG tablet Commonly known as: AUGMENTIN Take 1 tablet by mouth 2 (two) times daily.   aspirin EC 81 MG tablet Take 1 tablet (81 mg total) by mouth daily. Swallow whole.   diclofenac Sodium 1 % Gel Commonly known as: VOLTAREN Apply 2 g topically 4 (four) times daily.   furosemide 40 MG tablet Commonly known as: LASIX Take 40 mg by mouth daily.   gentamicin cream 0.1 % Commonly known as: GARAMYCIN Apply 1 Application topically 2 (two) times daily.   glipiZIDE 10 MG tablet Commonly known as: GLUCOTROL Take 10 mg by mouth 2 (two) times daily.   metFORMIN 1000 MG tablet Commonly known as: GLUCOPHAGE Take 1,000 mg by mouth 2 (two) times daily.   metoprolol succinate 50 MG 24 hr tablet Commonly known as: TOPROL-XL Take 1 tablet (50 mg total) by mouth daily. Take with or immediately following a meal. Start taking on: July 08, 2023   multivitamin with minerals tablet Take 1 tablet by mouth daily.   OVER THE COUNTER MEDICATION 1 Scoop daily. Super Beets Powder   silver sulfADIAZINE 1 % cream Commonly known as: SILVADENE Apply to affected area daily  the distal tibia fibula with mild  widening of the medial clear space. 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 Mild midfoot degenerative changes. Pes planovalgus. Osteopenia. No joint effusion. Ligaments Ligaments are suboptimally evaluated by CT. Muscles and Tendons Grossly intact.  Atrophy of the intrinsic foot muscles. Soft tissue Diffuse soft tissue swelling. Superficial ulceration along the medial plantar aspect of the midfoot near the base of the medial cuneiform. No fluid collection or subcutaneous emphysema. No soft tissue mass. IMPRESSION: 1. Superficial ulceration along the medial plantar aspect of the midfoot near the base of the medial cuneiform. No CT evidence of osteomyelitis. 2. Prior first ray amputation. Electronically Signed   By: [name] M.D.   On: 07/06/2023 11:35   DG Foot Complete Left  Result Date: 07/06/2023 CLINICAL DATA:  Left foot ulcer. EXAM: LEFT FOOT - COMPLETE 3+ VIEW COMPARISON:  CT left foot dated July 02, 2023. Left foot x-rays dated June 25, 2023. MRI left foot dated July 04, 2018. FINDINGS: Prior first ray amputation with heterotopic ossification. Chronic flattening of the second and third metatarsal heads. No acute fracture or dislocation. Bone mineralization is normal. Forefoot soft tissue swelling with superficial ulceration along the medial midfoot. IMPRESSION: 1. Forefoot soft tissue swelling with superficial ulceration along the medial midfoot. No radiographic evidence of osteomyelitis. Electronically Signed   By: [name] M.D.   On: 07/06/2023 11:29   ECHOCARDIOGRAM COMPLETE  Result Date: 07/04/2023    ECHOCARDIOGRAM REPORT   Patient Name:   Tyler [name] Date of Exam: 07/03/2023 Medical Rec #:  [id number]      Height:       78.0 in Accession #:    [id number]     Weight:       260.0 lb Date of Birth:  Oct 20, 1945      BSA:          2.524 m Patient Age:    77 years       BP:           121/67 mmHg  Patient Gender: M              HR:           107 bpm. Exam Location:  [facility] Procedure: 2D Echo, Cardiac Doppler and Color Doppler Indications:     I50.31 Acute Diastolic Heart Failure  History:         Patient has no prior history of Echocardiogram examinations.                  Cardiomegaly, CAD, Arrythmias:Atrial Fibrillation,                  Signs/Symptoms:Dyspnea; Risk Factors:Hypertension, Dyslipidemia                  and Diabetes. Lower extremity edema. Pulmonary hypertension.                  History of Rheumatic Fever.  Sonographer:     Daphine Deutscher RDCS Referring Phys:  [id number] Brien Few NIU Diagnosing Phys: Marcina Millard MD IMPRESSIONS  1. Left ventricular ejection fraction, by estimation, is 30 to 35%. The left ventricle has moderately decreased function. The left ventricle demonstrates regional wall motion abnormalities (see scoring diagram/findings for description). Indeterminate diastolic filling due to E-A fusion.  2. Right ventricular systolic function is normal. The right ventricular size is normal.  3. 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  Patient has been having worsening bilateral leg edema in the past several days with associated shortness of breath that has progressively worsened. He has chronic diabetic foot ulcers and follows up with [name] podiatry. On arrival patient was found to have cellulitis involving the right lower extremity.  In the setting of elevated BNP of 1066.  Echo showed EF 30 to 35%, patient was seen by cardiologist and heart failure medication adjusted with plans to follow-up at the clinic for maximal optimization.  Patient was also seen by podiatrist underwent bilateral lower extremity imaging however they did not show any evidence of underlying osteomyelitis.  Patient has been cleared by podiatrist today for discharge on oral antibiotics and to follow-up as an outpatient.  Consultants: Cardiology,  podiatry Procedures performed: None Disposition: Home Diet recommendation:  Cardiac diet DISCHARGE MEDICATION: Allergies as of 07/07/2023       Reactions   Eliquis [apixaban]    Dizziness  and vision change        Medication List     STOP taking these medications    carvedilol 6.25 MG tablet Commonly known as: COREG   lisinopril 40 MG tablet Commonly known as: ZESTRIL   metolazone 2.5 MG tablet Commonly known as: ZAROXOLYN       TAKE these medications    amoxicillin-clavulanate 875-125 MG tablet Commonly known as: AUGMENTIN Take 1 tablet by mouth 2 (two) times daily.   aspirin EC 81 MG tablet Take 1 tablet (81 mg total) by mouth daily. Swallow whole.   diclofenac Sodium 1 % Gel Commonly known as: VOLTAREN Apply 2 g topically 4 (four) times daily.   furosemide 40 MG tablet Commonly known as: LASIX Take 40 mg by mouth daily.   gentamicin cream 0.1 % Commonly known as: GARAMYCIN Apply 1 Application topically 2 (two) times daily.   glipiZIDE 10 MG tablet Commonly known as: GLUCOTROL Take 10 mg by mouth 2 (two) times daily.   metFORMIN 1000 MG tablet Commonly known as: GLUCOPHAGE Take 1,000 mg by mouth 2 (two) times daily.   metoprolol succinate 50 MG 24 hr tablet Commonly known as: TOPROL-XL Take 1 tablet (50 mg total) by mouth daily. Take with or immediately following a meal. Start taking on: July 08, 2023   multivitamin with minerals tablet Take 1 tablet by mouth daily.   OVER THE COUNTER MEDICATION 1 Scoop daily. Super Beets Powder   silver sulfADIAZINE 1 % cream Commonly known as: SILVADENE Apply to affected area daily  Physician Discharge Summary   Patient: Tyler [name] MRN: [id number] DOB: 20-May-1946  Admit date:     07/02/2023  Discharge date: 07/07/23  Discharge Physician: Loyce Dys   PCP: Elder Negus, NP   Recommendations at discharge:  Follow-up with cardiologist as well as podiatry  Discharge Diagnoses: Acute hypoxic respiratory failure secondary to acute on chronic diastolic CHF (congestive heart failure) [facility]): Myocardial injury:  Persistent atrial fibrillation [facility]): Essential hypertension Type II diabetes mellitus with renal manifestations (HCC): Chronic kidney disease, stage 3a (HCC):  Cellulitis of right lower extremity:  Diabetic foot ulcer (HCC):  Obesity (BMI 30-39.9):  Hospital Course: Tyler [name] is a 77 y.o. 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  Patient has been having worsening bilateral leg edema in the past several days with associated shortness of breath that has progressively worsened. He has chronic diabetic foot ulcers and follows up with [name] podiatry. On arrival patient was found to have cellulitis involving the right lower extremity.  In the setting of elevated BNP of 1066.  Echo showed EF 30 to 35%, patient was seen by cardiologist and heart failure medication adjusted with plans to follow-up at the clinic for maximal optimization.  Patient was also seen by podiatrist underwent bilateral lower extremity imaging however they did not show any evidence of underlying osteomyelitis.  Patient has been cleared by podiatrist today for discharge on oral antibiotics and to follow-up as an outpatient.  Consultants: Cardiology,  podiatry Procedures performed: None Disposition: Home Diet recommendation:  Cardiac diet DISCHARGE MEDICATION: Allergies as of 07/07/2023       Reactions   Eliquis [apixaban]    Dizziness  and vision change        Medication List     STOP taking these medications    carvedilol 6.25 MG tablet Commonly known as: COREG   lisinopril 40 MG tablet Commonly known as: ZESTRIL   metolazone 2.5 MG tablet Commonly known as: ZAROXOLYN       TAKE these medications    amoxicillin-clavulanate 875-125 MG tablet Commonly known as: AUGMENTIN Take 1 tablet by mouth 2 (two) times daily.   aspirin EC 81 MG tablet Take 1 tablet (81 mg total) by mouth daily. Swallow whole.   diclofenac Sodium 1 % Gel Commonly known as: VOLTAREN Apply 2 g topically 4 (four) times daily.   furosemide 40 MG tablet Commonly known as: LASIX Take 40 mg by mouth daily.   gentamicin cream 0.1 % Commonly known as: GARAMYCIN Apply 1 Application topically 2 (two) times daily.   glipiZIDE 10 MG tablet Commonly known as: GLUCOTROL Take 10 mg by mouth 2 (two) times daily.   metFORMIN 1000 MG tablet Commonly known as: GLUCOPHAGE Take 1,000 mg by mouth 2 (two) times daily.   metoprolol succinate 50 MG 24 hr tablet Commonly known as: TOPROL-XL Take 1 tablet (50 mg total) by mouth daily. Take with or immediately following a meal. Start taking on: July 08, 2023   multivitamin with minerals tablet Take 1 tablet by mouth daily.   OVER THE COUNTER MEDICATION 1 Scoop daily. Super Beets Powder   silver sulfADIAZINE 1 % cream Commonly known as: SILVADENE Apply to affected area daily  Physician Discharge Summary   Patient: Tyler [name] MRN: [id number] DOB: 20-May-1946  Admit date:     07/02/2023  Discharge date: 07/07/23  Discharge Physician: Loyce Dys   PCP: Elder Negus, NP   Recommendations at discharge:  Follow-up with cardiologist as well as podiatry  Discharge Diagnoses: Acute hypoxic respiratory failure secondary to acute on chronic diastolic CHF (congestive heart failure) [facility]): Myocardial injury:  Persistent atrial fibrillation [facility]): Essential hypertension Type II diabetes mellitus with renal manifestations (HCC): Chronic kidney disease, stage 3a (HCC):  Cellulitis of right lower extremity:  Diabetic foot ulcer (HCC):  Obesity (BMI 30-39.9):  Hospital Course: Tyler [name] is a 77 y.o. 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  Patient has been having worsening bilateral leg edema in the past several days with associated shortness of breath that has progressively worsened. He has chronic diabetic foot ulcers and follows up with [name] podiatry. On arrival patient was found to have cellulitis involving the right lower extremity.  In the setting of elevated BNP of 1066.  Echo showed EF 30 to 35%, patient was seen by cardiologist and heart failure medication adjusted with plans to follow-up at the clinic for maximal optimization.  Patient was also seen by podiatrist underwent bilateral lower extremity imaging however they did not show any evidence of underlying osteomyelitis.  Patient has been cleared by podiatrist today for discharge on oral antibiotics and to follow-up as an outpatient.  Consultants: Cardiology,  podiatry Procedures performed: None Disposition: Home Diet recommendation:  Cardiac diet DISCHARGE MEDICATION: Allergies as of 07/07/2023       Reactions   Eliquis [apixaban]    Dizziness  and vision change        Medication List     STOP taking these medications    carvedilol 6.25 MG tablet Commonly known as: COREG   lisinopril 40 MG tablet Commonly known as: ZESTRIL   metolazone 2.5 MG tablet Commonly known as: ZAROXOLYN       TAKE these medications    amoxicillin-clavulanate 875-125 MG tablet Commonly known as: AUGMENTIN Take 1 tablet by mouth 2 (two) times daily.   aspirin EC 81 MG tablet Take 1 tablet (81 mg total) by mouth daily. Swallow whole.   diclofenac Sodium 1 % Gel Commonly known as: VOLTAREN Apply 2 g topically 4 (four) times daily.   furosemide 40 MG tablet Commonly known as: LASIX Take 40 mg by mouth daily.   gentamicin cream 0.1 % Commonly known as: GARAMYCIN Apply 1 Application topically 2 (two) times daily.   glipiZIDE 10 MG tablet Commonly known as: GLUCOTROL Take 10 mg by mouth 2 (two) times daily.   metFORMIN 1000 MG tablet Commonly known as: GLUCOPHAGE Take 1,000 mg by mouth 2 (two) times daily.   metoprolol succinate 50 MG 24 hr tablet Commonly known as: TOPROL-XL Take 1 tablet (50 mg total) by mouth daily. Take with or immediately following a meal. Start taking on: July 08, 2023   multivitamin with minerals tablet Take 1 tablet by mouth daily.   OVER THE COUNTER MEDICATION 1 Scoop daily. Super Beets Powder   silver sulfADIAZINE 1 % cream Commonly known as: SILVADENE Apply to affected area daily  Physician Discharge Summary   Patient: Tyler [name] MRN: [id number] DOB: 20-May-1946  Admit date:     07/02/2023  Discharge date: 07/07/23  Discharge Physician: Loyce Dys   PCP: Elder Negus, NP   Recommendations at discharge:  Follow-up with cardiologist as well as podiatry  Discharge Diagnoses: Acute hypoxic respiratory failure secondary to acute on chronic diastolic CHF (congestive heart failure) [facility]): Myocardial injury:  Persistent atrial fibrillation [facility]): Essential hypertension Type II diabetes mellitus with renal manifestations (HCC): Chronic kidney disease, stage 3a (HCC):  Cellulitis of right lower extremity:  Diabetic foot ulcer (HCC):  Obesity (BMI 30-39.9):  Hospital Course: Tyler [name] is a 77 y.o. 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  Patient has been having worsening bilateral leg edema in the past several days with associated shortness of breath that has progressively worsened. He has chronic diabetic foot ulcers and follows up with [name] podiatry. On arrival patient was found to have cellulitis involving the right lower extremity.  In the setting of elevated BNP of 1066.  Echo showed EF 30 to 35%, patient was seen by cardiologist and heart failure medication adjusted with plans to follow-up at the clinic for maximal optimization.  Patient was also seen by podiatrist underwent bilateral lower extremity imaging however they did not show any evidence of underlying osteomyelitis.  Patient has been cleared by podiatrist today for discharge on oral antibiotics and to follow-up as an outpatient.  Consultants: Cardiology,  podiatry Procedures performed: None Disposition: Home Diet recommendation:  Cardiac diet DISCHARGE MEDICATION: Allergies as of 07/07/2023       Reactions   Eliquis [apixaban]    Dizziness  and vision change        Medication List     STOP taking these medications    carvedilol 6.25 MG tablet Commonly known as: COREG   lisinopril 40 MG tablet Commonly known as: ZESTRIL   metolazone 2.5 MG tablet Commonly known as: ZAROXOLYN       TAKE these medications    amoxicillin-clavulanate 875-125 MG tablet Commonly known as: AUGMENTIN Take 1 tablet by mouth 2 (two) times daily.   aspirin EC 81 MG tablet Take 1 tablet (81 mg total) by mouth daily. Swallow whole.   diclofenac Sodium 1 % Gel Commonly known as: VOLTAREN Apply 2 g topically 4 (four) times daily.   furosemide 40 MG tablet Commonly known as: LASIX Take 40 mg by mouth daily.   gentamicin cream 0.1 % Commonly known as: GARAMYCIN Apply 1 Application topically 2 (two) times daily.   glipiZIDE 10 MG tablet Commonly known as: GLUCOTROL Take 10 mg by mouth 2 (two) times daily.   metFORMIN 1000 MG tablet Commonly known as: GLUCOPHAGE Take 1,000 mg by mouth 2 (two) times daily.   metoprolol succinate 50 MG 24 hr tablet Commonly known as: TOPROL-XL Take 1 tablet (50 mg total) by mouth daily. Take with or immediately following a meal. Start taking on: July 08, 2023   multivitamin with minerals tablet Take 1 tablet by mouth daily.   OVER THE COUNTER MEDICATION 1 Scoop daily. Super Beets Powder   silver sulfADIAZINE 1 % cream Commonly known as: SILVADENE Apply to affected area daily

## 2023-07-07 NOTE — Progress Notes (Signed)
Subjective:  Patient ID: Tyler [name], male    DOB: 11-19-1945,  MRN: [id number]  Patient bilateral foot ulcerations. CT done. Denies fevers or Chills. Relates he is ready to go home and his family is coming from far to pick him up.   Past Medical History:  Diagnosis Date   (HFpEF) heart failure with preserved ejection fraction (HCC) 03/01/2020   a.) TTE 03/01/2020: EF 55-60%, mod MAC, mod AoV sclerosis, triv AR, mild TR, mod MR, RVSP 50-59; b.) TTE 09/10/2020: EF 55-60%, mod MAC, mod AoV sclerosis, mild TR, 3+ MR, RVSP 50-59; c.) TTE 09/26/2020: EF 55-60%, mild LA dil, triv PR, mild MR/TR, RVSP 37-49   Adenoma of left adrenal gland    Anemia    Arthritis    Atrial fibrillation and flutter (HCC)    a.) CHA2DS2VASc = 5 (age x2, HFpEF, HTN, T2DM);  b.) s/p CTI ablation 09/07/2020; c.) rate/rhythm maintained on oral carvedilol; not on chronic anticoagulation therapy   CAD (coronary artery disease)    Cardiomegaly    CKD (chronic kidney disease), stage III (HCC)    DOE (dyspnea on exertion)    Drug-induced bradycardia    Gangrene of toe of left foot (HCC)    a.) 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    HLD (hyperlipidemia)    Hypertension    Long term current use of aspirin    Lymphedema of both lower extremities    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)    a.) s/p amputation 11/04/2022   Peripheral vascular disease (HCC)    Pleural effusion on right 09/09/2020   a.) s/p RIGHT thoracentesis with 2180 cc yield   Pneumonia    Presence of permanent cardiac pacemaker 09/10/2020   a.) TVP placement 09/10/2020 due to intermittent CHB in setting of urosepsis; b.) s/p PPM placement 09/15/2020: MDT Azure XT DR (SN: [id number])   Pulmonary hypertension (HCC) 03/01/2020   a.) TTE: 03/01/2020: RVSP 50-59; b.) TTE 09/10/2020: RVSP 50-59; c.) TTE 09/26/2020: RVSP 37-49   RA (rheumatoid arthritis) (HCC)    Rheumatic  fever    Sepsis (HCC) 09/10/2020   a.) urosepsis --> BC x 2 sets and UC all grew out significant Proteus mirabilis; admitted to [facility] 09/07/2020 - 09/27/2020.   Sick sinus syndrome [facility])    a.) s/p MDT PPM placement 09/15/2020   T2DM (type 2 diabetes mellitus) (HCC)    Urinary retention    chronic, with indwelling Foley catheter and plans for a suprapubic   Wears dentures    full upper     Past Surgical History:  Procedure Laterality Date   AMPUTATION TOE Right 11/04/2022   Procedure: AMPUTATION TOE;  Surgeon: Felecia Shelling, [name];  [name];  Service: Podiatry;  Laterality: Right;   BONE BIOPSY Right 04/05/2023   Procedure: BONE BIOPSY THIRD & FOURTH;  Surgeon: Felecia Shelling, [name];  [name];  Service: Podiatry;  Laterality: Right;   CARDIAC ELECTROPHYSIOLOGY STUDY AND ABLATION N/A 09/07/2020   Procedure: CARDIAC EP STUDY AND ABLATION (CTI)   CATARACT EXTRACTION W/PHACO Right 01/16/2017   Procedure: CATARACT EXTRACTION PHACO AND INTRAOCULAR LENS PLACEMENT (IOC)  Right Complicated;  Surgeon: Lockie Mola, MD;  Location: [facility];  Service: Ophthalmology;  Laterality: Right;  IVA Block Healon 5 malyugin vision blue Diabetic - oral meds   CATARACT EXTRACTION W/PHACO Left 10/23/2022   Procedure: CATARACT EXTRACTION PHACO AND  INTRAOCULAR LENS PLACEMENT (IOC) LEFT DIABETIC;  Surgeon: Galen Manila, MD;  Location: [facility];  Service: Ophthalmology;  Laterality: Left;  Diabetic   COLONOSCOPY     LOWER EXTREMITY ANGIOGRAPHY Right 11/02/2022   Procedure: Lower Extremity Angiography;  Surgeon: Annice Needy, MD;  Location: [facility];  Service: Cardiovascular;  Laterality: Right;   METATARSAL HEAD EXCISION Left 07/05/2018   Procedure: RESECTION FIRST METATARSAL INFECTED BONE AND SOFT TISSUE;  Surgeon: Recardo Evangelist, [name];  [name];  Service: Podiatry;  Laterality: Left;   METATARSAL HEAD EXCISION Right  04/05/2023   Procedure: METATARSAL HEAD EXCISION THIRD & FOURTH;  Surgeon: Felecia Shelling, [name];  [name];  Service: Podiatry;  Laterality: Right;   PACEMAKER INSERTION  09/15/2020   TOE AMPUTATION Left    TONSILLECTOMY         Latest Ref Rng & Units 07/07/2023    2:44 AM 07/06/2023    3:08 AM 07/05/2023    5:02 AM  CBC  WBC 4.0 - 10.5 K/uL 7.8  7.2  8.3   Hemoglobin 13.0 - 17.0 g/dL 40.9  81.1  91.4   Hematocrit 39.0 - 52.0 % 33.7  34.0  34.7   Platelets 150 - 400 K/uL 238  208  223        Latest Ref Rng & Units 07/07/2023    2:44 AM 07/06/2023    3:08 AM 07/05/2023    5:02 AM  BMP  Glucose 70 - 99 mg/dL 782  956  213   BUN 8 - 23 mg/dL 37  36  37   Creatinine 0.61 - 1.24 mg/dL 0.86  5.78  4.69   Sodium 135 - 145 mmol/L 138  138  138   Potassium 3.5 - 5.1 mmol/L 3.9  3.6  4.1   Chloride 98 - 111 mmol/L 100  101  99   CO2 22 - 32 mmol/L 26  27  25    Calcium 8.9 - 10.3 mg/dL 9.0  8.6  9.0      Objective:   Vitals:   07/07/23 0743 07/07/23 1232  BP: 121/60 109/79  Pulse: 86 85  Resp: 18 20  Temp: 97.8 F (36.6 C) 98.6 F (37 C)  SpO2: 94% 95%    General:AA&O x 3. Normal mood and affect   Vascular: DP and PT non palpable bilateral. Brisk capillary refill to all digits. Pedal hair present   Neruological. Epicritic sensation grossly intact.   Derm: Ulcerations bilateral foot. Plantar midfoot ulceration with granular base. No erythema edema or purulence noted. Anterior ankle ulceration superficial with mild surrounding edema. Right plantar hallux ulceration. Edema present but no erythema or probe to bone. No purulence noted   MSK: MMT 5/5 in dorsiflexion, plantar flexion, inversion and eversion. Normal joint ROM without pain or crepitus.   CT left foot  IMPRESSION: 1. Unchanged superficial ulceration along the medial plantar aspect of the midfoot near the base of the medial cuneiform. No CT evidence of osteomyelitis. 2. Prior first ray amputation.     CT right foot  IMPRESSION: 1. Superficial ulceration along the plantar aspect of the first IP joint. No CT evidence of osteomyelitis. 2. Unchanged severe first MTP joint osteoarthritis. 3. 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  Arterial Duplex   IMPRESSION: 1. Diffuse monophasic waveforms of the right lower extremity are consistent with significant arterial occlusive disease. The site of stenosis most likely at the level of the mid superficial femoral artery.  2. Diffuse monophasic waveforms of the left lower extremity are consistent with significant arterial occlusive disease with stenosis likely located in the proximal SFA. 3. Further evaluation with CT angiography of the lower extremities would be beneficial.     Assessment & Plan:  Patient was evaluated and treated and all questions answered.  DX: Bilateral ulcerations with no acute osteomyelitis Wound care: betadine, DSD  Antibiotics: Recommend two weeks oral broad spectrum antibiotics upon discharge DME: Bilateral surgical shoes   Discussed with patient diagnosis and treatment options.  Imaging reviewed. No concern for osteomyelitis.  Discussed with patient from podiatry standpoint he is clear for discharge. Will continue two weeks of antibiotics and minimal  heel weightbearing in surgical shoes   Discussed with patient arterial duplex results and concern for decreased circulation. Do recommend he be seen by vascular. Discussed consulting while in the hospital vs outpatient consult. Patient relates he is ready to go home and would rather plan for outpatient follow-up.  Patient will follow-up in our office in one week from discharge.   Louann Sjogren, [name]  Accessible via secure chat for questions or concerns.

## 2023-07-09 ENCOUNTER — Ambulatory Visit (INDEPENDENT_AMBULATORY_CARE_PROVIDER_SITE_OTHER): Payer: Medicare Other | Admitting: Podiatry

## 2023-07-09 ENCOUNTER — Other Ambulatory Visit: Payer: Self-pay | Admitting: Podiatry

## 2023-07-09 DIAGNOSIS — E0843 Diabetes mellitus due to underlying condition with diabetic autonomic (poly)neuropathy: Secondary | ICD-10-CM | POA: Diagnosis not present

## 2023-07-09 DIAGNOSIS — L97512 Non-pressure chronic ulcer of other part of right foot with fat layer exposed: Secondary | ICD-10-CM

## 2023-07-09 DIAGNOSIS — M869 Osteomyelitis, unspecified: Secondary | ICD-10-CM | POA: Diagnosis not present

## 2023-07-09 DIAGNOSIS — L97522 Non-pressure chronic ulcer of other part of left foot with fat layer exposed: Secondary | ICD-10-CM

## 2023-07-10 NOTE — Progress Notes (Signed)
No chief complaint on file.   Subjective:  77 year old male PMHx T2DM, osteomyelitis with prior digital amputations presenting for outpatient follow-up regarding an ulcer to the plantar aspect of the right great toe.  Patient admitted and CT scan was performed negative for osteomyelitis.  Presenting for outpatient follow-up  Past Medical History:  Diagnosis Date   (HFpEF) heart failure with preserved ejection fraction (HCC) 03/01/2020   a.) TTE 03/01/2020: EF 55-60%, mod MAC, mod AoV sclerosis, triv AR, mild TR, mod MR, RVSP 50-59; b.) TTE 09/10/2020: EF 55-60%, mod MAC, mod AoV sclerosis, mild TR, 3+ MR, RVSP 50-59; c.) TTE 09/26/2020: EF 55-60%, mild LA dil, triv PR, mild MR/TR, RVSP 37-49   Adenoma of left adrenal gland    Anemia    Arthritis    Atrial fibrillation and flutter (HCC)    a.) CHA2DS2VASc = 5 (age x2, HFpEF, HTN, T2DM);  b.) s/p CTI ablation 09/07/2020; c.) rate/rhythm maintained on oral carvedilol; not on chronic anticoagulation therapy   CAD (coronary artery disease)    Cardiomegaly    CKD (chronic kidney disease), stage III (HCC)    DOE (dyspnea on exertion)    Drug-induced bradycardia    Gangrene of toe of left foot (HCC)    a.) 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    HLD (hyperlipidemia)    Hypertension    Long term current use of aspirin    Lymphedema of both lower extremities    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)    a.) s/p amputation 11/04/2022   Peripheral vascular disease (HCC)    Pleural effusion on right 09/09/2020   a.) s/p RIGHT thoracentesis with 2180 cc yield   Pneumonia    Presence of permanent cardiac pacemaker 09/10/2020   a.) TVP placement 09/10/2020 due to intermittent CHB in setting of urosepsis; b.) s/p PPM placement 09/15/2020: MDT Azure XT DR (SN: [id number])   Pulmonary hypertension (HCC) 03/01/2020   a.) TTE: 03/01/2020: RVSP 50-59; b.) TTE  09/10/2020: RVSP 50-59; c.) TTE 09/26/2020: RVSP 37-49   RA (rheumatoid arthritis) (HCC)    Rheumatic fever    Sepsis (HCC) 09/10/2020   a.) urosepsis --> BC x 2 sets and UC all grew out significant Proteus mirabilis; admitted to [facility] 09/07/2020 - 09/27/2020.   Sick sinus syndrome [facility])    a.) s/p MDT PPM placement 09/15/2020   T2DM (type 2 diabetes mellitus) (HCC)    Urinary retention    chronic, with indwelling Foley catheter and plans for a suprapubic   Wears dentures    full upper    Past Surgical History:  Procedure Laterality Date   AMPUTATION TOE Right 11/04/2022   Procedure: AMPUTATION TOE;  Surgeon: Felecia Shelling, [name];  [name];  Service: Podiatry;  Laterality: Right;   BONE BIOPSY Right 04/05/2023   Procedure: BONE BIOPSY THIRD & FOURTH;  Surgeon: Felecia Shelling, [name];  [name];  Service: Podiatry;  Laterality: Right;   CARDIAC ELECTROPHYSIOLOGY STUDY AND ABLATION N/A 09/07/2020   Procedure: CARDIAC EP STUDY AND ABLATION (CTI)   CATARACT EXTRACTION W/PHACO Right 01/16/2017   Procedure: CATARACT EXTRACTION PHACO AND INTRAOCULAR LENS PLACEMENT (IOC)  Right Complicated;  Surgeon: Lockie Mola, MD;  Location: [facility];  Service: Ophthalmology;  Laterality: Right;  IVA Block Healon 5 malyugin vision blue Diabetic - oral meds   CATARACT EXTRACTION W/PHACO Left 10/23/2022   Procedure:  CATARACT EXTRACTION PHACO AND INTRAOCULAR LENS PLACEMENT (IOC) LEFT DIABETIC;  Surgeon: Galen Manila, MD;  Location: [facility];  Service: Ophthalmology;  Laterality: Left;  Diabetic   COLONOSCOPY     LOWER EXTREMITY ANGIOGRAPHY Right 11/02/2022   Procedure: Lower Extremity Angiography;  Surgeon: Annice Needy, MD;  Location: [facility];  Service: Cardiovascular;  Laterality: Right;   METATARSAL HEAD EXCISION Left 07/05/2018   Procedure: RESECTION FIRST METATARSAL INFECTED BONE AND SOFT TISSUE;  Surgeon: Recardo Evangelist,  [name];  [name];  Service: Podiatry;  Laterality: Left;   METATARSAL HEAD EXCISION Right 04/05/2023   Procedure: METATARSAL HEAD EXCISION THIRD & FOURTH;  Surgeon: Felecia Shelling, [name];  [name];  Service: Podiatry;  Laterality: Right;   PACEMAKER INSERTION  09/15/2020   TOE AMPUTATION Left    TONSILLECTOMY      Allergies  Allergen Reactions   Eliquis [Apixaban]     Dizziness  and vision change    07/09/2023 LT foot  07/09/2023 RT great toe  Objective/Physical Exam New onset of ulcer noted to the plantar aspect of the right great toe measuring approximately 2.5 x 2.5 x 0.5 cm.  Fibrotic malodorous wound base noted with extensive amount of necrotic tissue.  It does not appear to extend into the bone or joint.  It does extend to the joint capsule however.  Serous drainage.  No purulence.  Ulcer to the plantar aspect of the left foot mostly unchanged.  measuring approximately 1.5 x 1.5 x 0.3 cm.  Granular wound base.  Currently no exposed bone muscle tendon ligament or joint.  No malodor.  Periwound is callused.  Radiographic exam RT foot 05/03/2023: Stable.  Dorsal subluxation of the second toe at the level of the MTP with hammertoe contracture deformity.  Absence of the third and fourth metatarsal heads with amputation of the third toe.  Osteotomies appear stable.  No obvious sign of erosions or irregularities concerning for osteomyelitis  Radiographic exam LT foot 06/25/2023: Absence of the left great toe noted with erosions noted along the first metatarsal compared to prior x-rays dated 07/03/2018.  Concern for possible osteomyelitis of the first metatarsal  CT FOOT LEFT WO CONTRAST 07/06/2023 IMPRESSION: 1. Unchanged superficial ulceration along the medial plantar aspect of the midfoot near the base of the medial cuneiform. No CT evidence of osteomyelitis. 2. Prior first ray amputation.  CT FOOT RIGHT WO CONTRAST 07/06/2023 IMPRESSION: 1. Superficial ulceration  along the plantar aspect of the first IP joint. No CT evidence of osteomyelitis. 2. Unchanged severe first MTP joint osteoarthritis. 3. 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   Assessment: 1. s/p third metatarsal head resection with bone biopsy right foot; currently stable 2.  Ulcer left foot secondary to diabetes mellitus.   3.  Ulcer plantar aspect of the right great toe  Plan of Care:  -Patient was evaluated.  CT imaging performed inpatient reviewed.   -Medically necessary excisional debridement including muscle and deep fascial tissue was performed using a tissue nipper to the right great toe ulcer.  Excisional debridement of the necrotic nonviable tissue down to healthier bleeding viable tissue was performed with postdebridement measurement same as pre- -Betadine soaked wet-to-dry dressing was applied.  Patient would rather leave clean dry and intact x 1 week -WBAT surgical shoes bilateral -Continue Augmentin 875/1.5 mg prescribed at discharge -Patient high risk for osteomyelitis to the right great toe.  Recommend reducing activity and staying off the foot is much as  possible -Return to clinic 1 week  Felecia Shelling, [name] [facility]  Dr. Felecia Shelling, [name]    [address] [place], [place] [address]                Office [phone]  Fax [phone]

## 2023-07-12 LAB — WOUND CULTURE

## 2023-07-15 ENCOUNTER — Encounter: Payer: Self-pay | Admitting: Family

## 2023-07-15 ENCOUNTER — Ambulatory Visit: Payer: BC Managed Care – PPO | Attending: Family | Admitting: Family

## 2023-07-15 VITALS — BP 137/90 | HR 101 | Ht 78.0 in | Wt 250.0 lb

## 2023-07-15 DIAGNOSIS — D631 Anemia in chronic kidney disease: Secondary | ICD-10-CM | POA: Diagnosis not present

## 2023-07-15 DIAGNOSIS — M069 Rheumatoid arthritis, unspecified: Secondary | ICD-10-CM | POA: Insufficient documentation

## 2023-07-15 DIAGNOSIS — E11621 Type 2 diabetes mellitus with foot ulcer: Secondary | ICD-10-CM | POA: Diagnosis not present

## 2023-07-15 DIAGNOSIS — R339 Retention of urine, unspecified: Secondary | ICD-10-CM | POA: Diagnosis not present

## 2023-07-15 DIAGNOSIS — L97509 Non-pressure chronic ulcer of other part of unspecified foot with unspecified severity: Secondary | ICD-10-CM | POA: Insufficient documentation

## 2023-07-15 DIAGNOSIS — Z7984 Long term (current) use of oral hypoglycemic drugs: Secondary | ICD-10-CM | POA: Insufficient documentation

## 2023-07-15 DIAGNOSIS — I5022 Chronic systolic (congestive) heart failure: Secondary | ICD-10-CM | POA: Insufficient documentation

## 2023-07-15 DIAGNOSIS — I4819 Other persistent atrial fibrillation: Secondary | ICD-10-CM | POA: Insufficient documentation

## 2023-07-15 DIAGNOSIS — I130 Hypertensive heart and chronic kidney disease with heart failure and stage 1 through stage 4 chronic kidney disease, or unspecified chronic kidney disease: Secondary | ICD-10-CM | POA: Insufficient documentation

## 2023-07-15 DIAGNOSIS — E1151 Type 2 diabetes mellitus with diabetic peripheral angiopathy without gangrene: Secondary | ICD-10-CM | POA: Diagnosis not present

## 2023-07-15 DIAGNOSIS — N189 Chronic kidney disease, unspecified: Secondary | ICD-10-CM | POA: Diagnosis not present

## 2023-07-15 DIAGNOSIS — E785 Hyperlipidemia, unspecified: Secondary | ICD-10-CM | POA: Diagnosis not present

## 2023-07-15 DIAGNOSIS — I10 Essential (primary) hypertension: Secondary | ICD-10-CM | POA: Diagnosis present

## 2023-07-15 DIAGNOSIS — E1122 Type 2 diabetes mellitus with diabetic chronic kidney disease: Secondary | ICD-10-CM | POA: Diagnosis not present

## 2023-07-15 DIAGNOSIS — E119 Type 2 diabetes mellitus without complications: Secondary | ICD-10-CM | POA: Diagnosis present

## 2023-07-15 DIAGNOSIS — I428 Other cardiomyopathies: Secondary | ICD-10-CM | POA: Diagnosis not present

## 2023-07-15 MED ORDER — SPIRONOLACTONE 25 MG PO TABS: 25.0000 mg | ORAL_TABLET | Freq: Every day | ORAL | 1 refills | Status: DC

## 2023-07-15 NOTE — Progress Notes (Signed)
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   Admitted 10/30/22 due to infection of right third toe. Noted his right third toe turning dark few days ago and complained of pain in the affected area as well as fever. Right foot x-ray showed soft tissue edema overlies the dorsum of the foot. No soft tissue gas or radiopaque foreign body. No radiographic findings of osteomyelitis. IV antibiotics given. ABI abn. Vascular consulted. RLE angiography done. Amputation  of R 3rd toe done. Post-op had significant epistaxis L nare requiring insertion rhino rocket packing overnight, holding heparin/ASA. Restarted at discharge. Admitted 07/02/23 due to shortness of breath and worsening leg edema. Chronic diabetic foot ulcers. Found to have cellulitis involving the right lower extremity along with an elevated BNP of 1066. Patient was seen by podiatrist and underwent bilateral lower extremity imaging however they did not show any evidence of underlying osteomyelitis. Cardiology consulted. IV diuresed with transition to oral diuretics.     Echo 09/20/20: EF 55-60% with 3+ TR Echo 07/03/23: EF 30-35% with moderate MR  He presents today for his initial HF visit with a chief complaint of minimal fatigue with moderate exertion. Chronic in nature. Has associated pedal edema along with this. Denies shortness of breath, chest pain, cough, palpitations, abdominal distention, dizziness, difficulty sleeping or weight gain. Says that he doesn't want to take any medications that he can't possibly get off  of if [place] "shuts it down". Has been eating beets daily and wants to stay as natural as possible. He says that his SBP at home runs in the 120's. Goes to the wound center for his diabetic wounds. Has chronic foley catheter because of a "botched surgery". Says that he wasn't supposed to live as a child because he had rheumatic fever.   Review of Systems: [y] = yes, [ ]  = no   General: Weight gain [ ] ; Weight loss [ ] ; Anorexia [ ] ; Fatigue Cove.Etienne ]; Fever [ ] ; Chills [ ] ; Weakness [ ]   Cardiac: Chest pain/pressure [ ] ; Resting SOB [ ] ; Exertional SOB [ ] ; Orthopnea [ ] ; Pedal Edema Cove.Etienne ]; Palpitations [ ] ; Syncope [ ] ; Presyncope [ ] ; Paroxysmal nocturnal dyspnea[ ]   Pulmonary: Cough [ ] ; Wheezing[ ] ; Hemoptysis[ ] ; Sputum [ ] ; Snoring [ ]   GI: Vomiting[ ] ; Dysphagia[ ] ; Melena[ ] ; Hematochezia [ ] ; Heartburn[ ] ; Abdominal pain [ ] ; Constipation [ ] ; Diarrhea [ ] ; BRBPR [ ]   GU: Hematuria[ ] ; Dysuria [ ] ; Nocturia[ ]   Vascular: Pain in legs with walking [ ] ; Pain in feet with lying flat [ ] ; Non-healing sores [ ] ; Stroke [ ] ; TIA [ ] ; Slurred speech [ ] ;  Neuro: Headaches[ ] ; Vertigo[ ] ; Seizures[ ] ; Paresthesias[ ] ;Blurred vision [ ] ; Diplopia [ ] ; Vision changes [ ]   Ortho/Skin: Arthritis [ ] ; Joint pain [ ] ; Muscle pain [ ] ; Joint swelling [ ] ; Back Pain [ ] ; Rash [ ]   Psych: Depression[ ] ; Anxiety[ ]   Heme: Bleeding problems [ ] ; Clotting disorders [ ] ; Anemia [ ]   Endocrine: Diabetes Cove.Etienne ]; Thyroid dysfunction[ ]    Past Medical History:  Diagnosis Date   (HFpEF) heart failure with preserved ejection fraction (HCC) 03/01/2020   a.) TTE 03/01/2020: EF 55-60%, mod MAC, mod AoV sclerosis, triv AR, mild TR, mod MR, RVSP 50-59; b.) TTE 09/10/2020: EF 55-60%, mod MAC, mod AoV sclerosis, mild TR, 3+ MR, RVSP 50-59; c.) TTE 09/26/2020: EF 55-60%, mild LA dil, triv PR, mild MR/TR, RVSP 37-49   Adenoma of left adrenal gland    Anemia    Arthritis    Atrial fibrillation and flutter (HCC)    a.)  CHA2DS2VASc = 5 (age x2, HFpEF, HTN, T2DM);  b.) s/p CTI ablation 09/07/2020; c.) rate/rhythm maintained on oral carvedilol; not on chronic anticoagulation therapy   CAD (coronary artery disease)    Cardiomegaly    CKD (chronic kidney disease), stage III (HCC)    DOE (dyspnea on exertion)    Drug-induced bradycardia    Gangrene of toe of left foot (HCC)    a.) 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    HLD (hyperlipidemia)    Hypertension    Long term current use of aspirin    Lymphedema of both lower extremities    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)    a.) s/p amputation 11/04/2022   Peripheral vascular disease (HCC)    Pleural effusion on right 09/09/2020   a.) s/p RIGHT thoracentesis with 2180 cc yield   Pneumonia    Presence of permanent cardiac pacemaker 09/10/2020   a.) TVP placement 09/10/2020 due to intermittent CHB in setting of urosepsis; b.) s/p PPM placement 09/15/2020: MDT Azure XT DR (SN: [id number])   Pulmonary hypertension (HCC) 03/01/2020   a.) TTE: 03/01/2020: RVSP 50-59; b.) TTE 09/10/2020: RVSP 50-59; c.) TTE 09/26/2020: RVSP 37-49   RA (rheumatoid arthritis) (HCC)    Rheumatic fever    Sepsis (HCC) 09/10/2020   a.) urosepsis --> BC x 2 sets and UC all grew out significant Proteus mirabilis; admitted to [facility] 09/07/2020 - 09/27/2020.   Sick sinus syndrome [facility])    a.) s/p MDT PPM placement 09/15/2020   T2DM (type 2 diabetes mellitus) (HCC)    Urinary retention    chronic, with indwelling Foley catheter and plans for a suprapubic   Wears dentures    full upper    Current Outpatient Medications  Medication Sig Dispense Refill   amoxicillin-clavulanate (AUGMENTIN) 875-125 MG tablet Take 1 tablet by mouth 2 (two) times daily. 28 tablet 0   aspirin EC 81 MG tablet Take 1 tablet (81 mg total) by mouth daily. Swallow whole. 30 tablet 0   diclofenac Sodium (VOLTAREN) 1  % GEL Apply 2 g topically 4 (four) times daily. 2 g 0   furosemide (LASIX) 40 MG tablet Take 40 mg by mouth daily.     gentamicin cream (GARAMYCIN) 0.1 % Apply 1 Application topically 2 (two) times daily. 30 g 1   glipiZIDE (GLUCOTROL) 10 MG tablet Take 10 mg by mouth 2 (two) times daily.     metFORMIN (GLUCOPHAGE) 1000 MG tablet Take 1,000 mg by mouth 2 (two) times daily.     metoprolol succinate (TOPROL-XL) 50 MG 24 hr tablet Take 1 tablet (50 mg total) by mouth daily. Take with or immediately following a meal. 60 tablet 0   Multiple Vitamins-Minerals (MULTIVITAMIN WITH MINERALS) tablet Take 1 tablet by mouth daily.     OVER THE COUNTER MEDICATION 1 Scoop daily. Super Beets Powder  silver sulfADIAZINE (SILVADENE) 1 % cream Apply to affected area daily (Patient not taking: Reported on 07/02/2023) 50 g 1   spironolactone (ALDACTONE) 25 MG tablet Take 0.5 tablets (12.5 mg total) by mouth daily. 60 tablet 0   No current facility-administered medications for this visit.    Allergies  Allergen Reactions   Eliquis [Apixaban]     Dizziness  and vision change      Social History   Socioeconomic History   Marital status: Single    Spouse name: Not on file   Number of children: Not on file   Years of education: Not on file   Highest education level: Not on file  Occupational History   Not on file  Tobacco Use   Smoking status: Never   Smokeless tobacco: Never  Vaping Use   Vaping status: Never Used  Substance and Sexual Activity   Alcohol use: Not Currently    Comment: rarely   Drug use: Never   Sexual activity: Not Currently    Birth control/protection: None  Other Topics Concern   Not on file  Social History Narrative   Lives with roommate   Social Determinants of Health   Financial Resource Strain: Low Risk  (03/20/2023)   Received [facility], [facility]   Overall Financial Resource Strain (CARDIA)    Difficulty of Paying Living Expenses: Not hard at all   Food Insecurity: No Food Insecurity (03/20/2023)   Received from [facility], [facility]   Hunger Vital Sign    Worried About Running Out of Food in the Last Year: Never true    Ran Out of Food in the Last Year: Never true  Transportation Needs: No Transportation Needs (03/20/2023)   Received from [facility], [facility]   PRAPARE - Transportation    Lack of Transportation (Medical): No    Lack of Transportation (Non-Medical): No  Physical Activity: Not on file  Stress: No Stress Concern Present (09/07/2020)   Received from [facility], [facility] - Occupational Stress Questionnaire    Feeling of Stress : Not at all  Social Connections: Unknown (02/01/2022)   Received from [facility], [facility]   Social Network    Social Network: Not on file  Intimate Partner Violence: Not At Risk (10/30/2022)   Humiliation, Afraid, Rape, and Kick questionnaire    Fear of Current or Ex-Partner: No    Emotionally Abused: No    Physically Abused: No    Sexually Abused: No      Family History  Problem Relation Age of Onset   Cancer Niece    Vitals:   07/15/23 1509  BP: (!) 137/90  Pulse: (!) 101  SpO2: 100%  Weight: 250 lb (113.4 kg)  Height: 6\' 6"  (1.981 m)   Wt Readings from Last 3 Encounters:  07/15/23 250 lb (113.4 kg)  07/06/23 235 lb 7.2 oz (106.8 kg)  04/06/23 260 lb (117.9 kg)   Lab Results  Component Value Date   CREATININE 1.25 (H) 07/07/2023   CREATININE 1.22 07/06/2023   CREATININE 1.38 (H) 07/05/2023   PHYSICAL EXAM: General:  Well appearing. No respiratory difficulty HEENT: normal Neck: supple. no JVD. No lymphadenopathy or thyromegaly appreciated. Cor: PMI nondisplaced. Regular rhythm/ tachycardic. No rubs, gallops or murmurs. Lungs: clear Abdomen: soft, nontender, nondistended. No hepatosplenomegaly. No bruits or masses. Good bowel sounds. Extremities: no cyanosis, clubbing, rash, 1+ pitting edema  bilaterally (wearing support shoes bilaterally) Neuro: alert &  oriented x 3, cranial nerves grossly intact. moves all 4 extremities w/o difficulty. Affect pleasant.  ECG: not done   ASSESSMENT & PLAN:  1: NICM with reduced ejection fraction- - suspect due to HTN/ atrial fibrillation - NYHA class II - euvolemic - weighing daily; reminded to call for overnight weight gain of > 2 pounds or a weekly weight gain of > 5 pounds - Echo 09/20/20: EF 55-60% with 3+ TR - Echo 07/03/23: EF 30-35% with moderate MR - continue furosemide 40mg  daily - begin spironolactone 25mg  daily - would like to get back on metoprolol as well - BMET next week - BNP 07/02/23 was 1066.9  2: HTN- - BP 137/90 - starting spironolactone 25mg  daily - saw PCP (McClanahan) 11/23 - BMP 07/07/23 showed sodium 138, potassium 3.9, creatinine 1.25 & GFR 59 - repeat BMET next week with the addition of spironolactone today  3: Atrial fibrillation- - saw [facility] 06/24; to get established with Dr. Darrold Junker - on ASA 81mg  - eliquis caused dizziness & vision changes  4: DM- - A1c 07/02/23 was 7.2% - going to the wound center for the wounds on his feet - continue glipizide 10mg  BID   Return in 1 week, sooner if needed.    Delma Freeze, FNP 07/15/23

## 2023-07-15 NOTE — Patient Instructions (Addendum)
START SPIRONOLACTONE 25 MG ONCE DAILY  Continue weighing daily and call for an overnight weight gain of 3 pounds or more or a weekly weight gain of more than 5 pounds.

## 2023-07-16 ENCOUNTER — Encounter: Payer: Self-pay | Admitting: Podiatry

## 2023-07-16 ENCOUNTER — Ambulatory Visit (INDEPENDENT_AMBULATORY_CARE_PROVIDER_SITE_OTHER): Payer: BC Managed Care – PPO | Admitting: Podiatry

## 2023-07-16 DIAGNOSIS — L97522 Non-pressure chronic ulcer of other part of left foot with fat layer exposed: Secondary | ICD-10-CM

## 2023-07-16 DIAGNOSIS — M869 Osteomyelitis, unspecified: Secondary | ICD-10-CM | POA: Diagnosis not present

## 2023-07-16 NOTE — Progress Notes (Signed)
Chief Complaint  Patient presents with   Foot Ulcer    Follow up ulcer hallux right and plantar foot left   "I guess they are okay, he wraps them for me"    Subjective:  77 year old male PMHx T2DM, osteomyelitis with prior digital amputations presenting for outpatient follow-up regarding an ulcer to the plantar aspect of the right great toe.  Patient admitted and CT scan was performed negative for osteomyelitis.  Presenting for outpatient follow-up  Past Medical History:  Diagnosis Date   (HFpEF) heart failure with preserved ejection fraction (HCC) 03/01/2020   a.) TTE 03/01/2020: EF 55-60%, mod MAC, mod AoV sclerosis, triv AR, mild TR, mod MR, RVSP 50-59; b.) TTE 09/10/2020: EF 55-60%, mod MAC, mod AoV sclerosis, mild TR, 3+ MR, RVSP 50-59; c.) TTE 09/26/2020: EF 55-60%, mild LA dil, triv PR, mild MR/TR, RVSP 37-49   Adenoma of left adrenal gland    Anemia    Arthritis    Atrial fibrillation and flutter (HCC)    a.) CHA2DS2VASc = 5 (age x2, HFpEF, HTN, T2DM);  b.) s/p CTI ablation 09/07/2020; c.) rate/rhythm maintained on oral carvedilol; not on chronic anticoagulation therapy   CAD (coronary artery disease)    Cardiomegaly    CKD (chronic kidney disease), stage III (HCC)    DOE (dyspnea on exertion)    Drug-induced bradycardia    Gangrene of toe of left foot (HCC)    a.) 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    HLD (hyperlipidemia)    Hypertension    Long term current use of aspirin    Lymphedema of both lower extremities    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)    a.) s/p amputation 11/04/2022   Peripheral vascular disease (HCC)    Pleural effusion on right 09/09/2020   a.) s/p RIGHT thoracentesis with 2180 cc yield   Pneumonia    Presence of permanent cardiac pacemaker 09/10/2020   a.) TVP placement 09/10/2020 due to intermittent CHB in setting of urosepsis; b.) s/p PPM placement 09/15/2020:  MDT Azure XT DR (SN: [id number])   Pulmonary hypertension (HCC) 03/01/2020   a.) TTE: 03/01/2020: RVSP 50-59; b.) TTE 09/10/2020: RVSP 50-59; c.) TTE 09/26/2020: RVSP 37-49   RA (rheumatoid arthritis) (HCC)    Rheumatic fever    Sepsis (HCC) 09/10/2020   a.) urosepsis --> BC x 2 sets and UC all grew out significant Proteus mirabilis; admitted to [facility] 09/07/2020 - 09/27/2020.   Sick sinus syndrome [facility])    a.) s/p MDT PPM placement 09/15/2020   T2DM (type 2 diabetes mellitus) (HCC)    Urinary retention    chronic, with indwelling Foley catheter and plans for a suprapubic   Wears dentures    full upper    Past Surgical History:  Procedure Laterality Date   AMPUTATION TOE Right 11/04/2022   Procedure: AMPUTATION TOE;  Surgeon: Felecia Shelling, [name];  [name];  Service: Podiatry;  Laterality: Right;   BONE BIOPSY Right 04/05/2023   Procedure: BONE BIOPSY THIRD & FOURTH;  Surgeon: Felecia Shelling, [name];  [name];  Service: Podiatry;  Laterality: Right;   CARDIAC ELECTROPHYSIOLOGY STUDY AND ABLATION N/A 09/07/2020   Procedure: CARDIAC EP STUDY AND ABLATION (CTI)   CATARACT EXTRACTION W/PHACO Right 01/16/2017   Procedure: CATARACT EXTRACTION PHACO AND INTRAOCULAR LENS PLACEMENT (IOC)  Right Complicated;  Surgeon: Lockie Mola, MD;  Location: [facility];  Service: Ophthalmology;  Laterality: Right;  IVA Block Healon 5 malyugin vision blue Diabetic - oral meds   CATARACT EXTRACTION W/PHACO Left 10/23/2022   Procedure: CATARACT EXTRACTION PHACO AND INTRAOCULAR LENS PLACEMENT (IOC) LEFT DIABETIC;  Surgeon: Galen Manila, MD;  Location: [facility];  Service: Ophthalmology;  Laterality: Left;  Diabetic   COLONOSCOPY     LOWER EXTREMITY ANGIOGRAPHY Right 11/02/2022   Procedure: Lower Extremity Angiography;  Surgeon: Annice Needy, MD;  Location: [facility] INVASIVE CV LAB;  Service: Cardiovascular;  Laterality: Right;   METATARSAL HEAD  EXCISION Left 07/05/2018   Procedure: RESECTION FIRST METATARSAL INFECTED BONE AND SOFT TISSUE;  Surgeon: Recardo Evangelist, [name];  [name];  Service: Podiatry;  Laterality: Left;   METATARSAL HEAD EXCISION Right 04/05/2023   Procedure: METATARSAL HEAD EXCISION THIRD & FOURTH;  Surgeon: Felecia Shelling, [name];  [name];  Service: Podiatry;  Laterality: Right;   PACEMAKER INSERTION  09/15/2020   TOE AMPUTATION Left    TONSILLECTOMY      Allergies  Allergen Reactions   Eliquis [Apixaban]     Dizziness  and vision change    07/09/2023 LT foot  07/09/2023 RT great toe   LT foot 07/16/2023  RT great toe 07/16/2023  Objective/Physical Exam New onset of ulcer noted to the plantar aspect of the right great toe measuring approximately 2.5 x 2.5 x 0.5 cm.  Fibrotic malodorous wound base noted with extensive amount of necrotic tissue.  It does not appear to extend into the bone or joint.  It does extend to the joint capsule however.  Serous drainage.  No purulence.  Ulcer to the plantar aspect of the left foot mostly unchanged.  measuring approximately 1.5 x 1.5 x 0.3 cm.  Granular wound base.  Currently no exposed bone muscle tendon ligament or joint.  No malodor.  Periwound is callused.  Radiographic exam RT foot 05/03/2023: Stable.  Dorsal subluxation of the second toe at the level of the MTP with hammertoe contracture deformity.  Absence of the third and fourth metatarsal heads with amputation of the third toe.  Osteotomies appear stable.  No obvious sign of erosions or irregularities concerning for osteomyelitis  Radiographic exam LT foot 06/25/2023: Absence of the left great toe noted with erosions noted along the first metatarsal compared to prior x-rays dated 07/03/2018.  Concern for possible osteomyelitis of the first metatarsal  CT FOOT LEFT WO CONTRAST 07/06/2023 IMPRESSION: 1. Unchanged superficial ulceration along the medial plantar aspect of the midfoot near the  base of the medial cuneiform. No CT evidence of osteomyelitis. 2. Prior first ray amputation.  CT FOOT RIGHT WO CONTRAST 07/06/2023 IMPRESSION: 1. Superficial ulceration along the plantar aspect of the first IP joint. No CT evidence of osteomyelitis. 2. Unchanged severe first MTP joint osteoarthritis. 3. 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   Assessment: 1. s/p third metatarsal head resection with bone biopsy right foot; currently stable 2.  Ulcer left foot secondary to diabetes mellitus.   3.  Ulcer plantar aspect of the right great toe  Plan of Care:  -Patient was evaluated.   -Medically necessary excisional debridement including muscle and deep fascial tissue was performed using a tissue nipper to the right great toe ulcer.  Excisional debridement of the necrotic nonviable tissue down to healthier bleeding viable tissue was performed with postdebridement measurement same as pre- -Betadine soaked wet-to-dry dressing was applied.  Patient would rather leave clean dry and intact x 1  week -WBAT surgical shoes bilateral -Patient has finished Augmentin 875/125 mg prescribed at discharge -Continues to be high risk for osteomyelitis to the right great toe.  Recommend reducing activity and staying off the foot is much as possible -Return to clinic 3 weeks  Felecia Shelling, [name] [facility]  Dr. Felecia Shelling, [name]    [address] [place], [place] [address]                Office [phone]  Fax [phone]

## 2023-07-19 ENCOUNTER — Emergency Department
Admission: EM | Admit: 2023-07-19 | Discharge: 2023-07-19 | Disposition: A | Discharge status: Home / Self Care | Payer: Medicare Other | Attending: Emergency Medicine | Admitting: Emergency Medicine

## 2023-07-19 ENCOUNTER — Encounter: Payer: Self-pay | Admitting: Emergency Medicine

## 2023-07-19 ENCOUNTER — Other Ambulatory Visit: Payer: Self-pay

## 2023-07-19 DIAGNOSIS — R600 Localized edema: Secondary | ICD-10-CM | POA: Diagnosis not present

## 2023-07-19 DIAGNOSIS — I509 Heart failure, unspecified: Secondary | ICD-10-CM | POA: Insufficient documentation

## 2023-07-19 DIAGNOSIS — M79662 Pain in left lower leg: Secondary | ICD-10-CM | POA: Insufficient documentation

## 2023-07-19 DIAGNOSIS — I2510 Atherosclerotic heart disease of native coronary artery without angina pectoris: Secondary | ICD-10-CM | POA: Insufficient documentation

## 2023-07-19 DIAGNOSIS — I130 Hypertensive heart and chronic kidney disease with heart failure and stage 1 through stage 4 chronic kidney disease, or unspecified chronic kidney disease: Secondary | ICD-10-CM | POA: Insufficient documentation

## 2023-07-19 DIAGNOSIS — Y92000 Kitchen of unspecified non-institutional (private) residence as  the place of occurrence of the external cause: Secondary | ICD-10-CM | POA: Insufficient documentation

## 2023-07-19 DIAGNOSIS — E1122 Type 2 diabetes mellitus with diabetic chronic kidney disease: Secondary | ICD-10-CM | POA: Diagnosis not present

## 2023-07-19 DIAGNOSIS — W01198A Fall on same level from slipping, tripping and stumbling with subsequent striking against other object, initial encounter: Secondary | ICD-10-CM | POA: Insufficient documentation

## 2023-07-19 DIAGNOSIS — N189 Chronic kidney disease, unspecified: Secondary | ICD-10-CM | POA: Insufficient documentation

## 2023-07-19 MED ORDER — TRAMADOL HCL 50 MG PO TABS: 50.0000 mg | ORAL_TABLET | Freq: Three times a day (TID) | ORAL | 0 refills | Status: AC | PRN

## 2023-07-19 NOTE — ED Provider Notes (Signed)
[facility] Emergency Department Provider Note     Event Date/Time   First MD Initiated Contact with Patient 07/19/23 1716     (approximate)   History   Leg Pain   HPI  Tyler [name] is a 77 y.o. male with a history of CHF, DM, CKD, HTN, and CAD, presents to the ED for evaluation of injury sustained following mechanical fall.  Patient will report he fell this morning in his kitchen, slipping and hitting his knees on the cabinets.  He denies any head injury or LOC.  His primary complaint is actually pain to the lower legs at the calf.  He reports that the swelling in his legs is normal for him and not above his baseline.  He is just endorsing an acute on chronic pain at this time.  He has very little concern for his fall which he reports more like a slip and claims that he would not have mentioned it if he knew the nurse would make such a fuss about it. Denies any knee pain, hip pain, or any back pain.  Physical Exam   Triage Vital Signs: ED Triage Vitals  Encounter Vitals Group     BP 07/19/23 1622 (!) 142/85     Systolic BP Percentile --      Diastolic BP Percentile --      Pulse Rate 07/19/23 1622 98     Resp 07/19/23 1622 18     Temp 07/19/23 1622 97.9 F (36.6 C)     Temp src --      SpO2 07/19/23 1622 99 %     Weight --      Height --      Head Circumference --      Peak Flow --      Pain Score 07/19/23 1621 6     Pain Loc --      Pain Education --      Exclude from Growth Chart --     Most recent vital signs: Vitals:   07/19/23 1622 07/19/23 1841  BP: (!) 142/85 (!) 140/79  Pulse: 98 90  Resp: 18 17  Temp: 97.9 F (36.6 C) 98 F (36.7 C)  SpO2: 99% 98%    General Awake, no distress. NAD HEENT NCAT. PERRL. EOMI. No rhinorrhea. Mucous membranes are moist.  CV:  Good peripheral perfusion. RRR RESP:  Normal effort. CTA.  Normal cap refill. ABD:  No distention.  MSK:  Left lower extremity with peripheral edema consistent with  the patient's base.  Multiple dried skin lesions without evidence of acute infection, purulence, drainage.  No calf or Achilles tenderness is noted.   ED Results / Procedures / Treatments   Labs (all labs ordered are listed, but only abnormal results are displayed) Labs Reviewed - No data to display   EKG   RADIOLOGY  No results found.   PROCEDURES:  Critical Care performed: No  Procedures   MEDICATIONS ORDERED IN ED: Medications - No data to display   IMPRESSION / MDM / ASSESSMENT AND PLAN / ED COURSE  I reviewed the triage vital signs and the nursing notes.                              Differential diagnosis includes, but is not limited to, peripheral edema, CHF exacerbation, myalgias  Patient's presentation is most consistent with acute, uncomplicated illness.  Patient's diagnosis is consistent with  acute on chronic left calf pain and leg swelling.  Discussed further workup including ultrasound imaging the patient declined any further testing at this time.  He is requesting a prescription for some pain medicine.  Patient will be discharged home with prescriptions for Ultram primary provider as discussed,. Patient is to follow up with his specialist with primary provider as discussed, as needed or otherwise directed. Patient is given ED precautions to return to the ED for any worsening or new symptoms.     FINAL CLINICAL IMPRESSION(S) / ED DIAGNOSES   Final diagnoses:  Pain of left calf  Lower extremity edema     Rx / DC Orders   ED Discharge Orders          Ordered    traMADol (ULTRAM) 50 MG tablet  3 times daily with meals PRN        07/19/23 1819             Note:  This document was prepared using Dragon voice recognition software and may include unintentional dictation errors.    Lissa Hoard, PA-C 07/22/23 2257    Shaune Pollack, MD 07/26/23 1136

## 2023-07-19 NOTE — ED Notes (Signed)
Dc instructions and scripts reviewed with pt noquestion at this time. Ambulated with walker out of ed

## 2023-07-19 NOTE — ED Triage Notes (Signed)
Pt has ongoing bilateral lower extremity pain.  This morning slipped in the kitchen and hit his knees on the cabinet.  Unsure if this contributed to the exacerbation of pain. No knee pain, just bilateral calf pain.  No swelling.

## 2023-07-19 NOTE — Discharge Instructions (Signed)
Take the prescription med as directed. Follow-up with your primary provider for ongoing concerns.

## 2023-07-24 ENCOUNTER — Encounter: Payer: Self-pay | Admitting: Family

## 2023-07-24 ENCOUNTER — Ambulatory Visit: Payer: BC Managed Care – PPO | Attending: Family | Admitting: Family

## 2023-07-24 VITALS — BP 132/65 | HR 85 | Ht 78.0 in | Wt 254.0 lb

## 2023-07-24 DIAGNOSIS — I441 Atrioventricular block, second degree: Secondary | ICD-10-CM | POA: Insufficient documentation

## 2023-07-24 DIAGNOSIS — M069 Rheumatoid arthritis, unspecified: Secondary | ICD-10-CM | POA: Insufficient documentation

## 2023-07-24 DIAGNOSIS — N189 Chronic kidney disease, unspecified: Secondary | ICD-10-CM | POA: Insufficient documentation

## 2023-07-24 DIAGNOSIS — I5032 Chronic diastolic (congestive) heart failure: Secondary | ICD-10-CM | POA: Diagnosis not present

## 2023-07-24 DIAGNOSIS — I5022 Chronic systolic (congestive) heart failure: Secondary | ICD-10-CM | POA: Diagnosis not present

## 2023-07-24 DIAGNOSIS — I890 Lymphedema, not elsewhere classified: Secondary | ICD-10-CM | POA: Diagnosis not present

## 2023-07-24 DIAGNOSIS — Z7984 Long term (current) use of oral hypoglycemic drugs: Secondary | ICD-10-CM | POA: Diagnosis not present

## 2023-07-24 DIAGNOSIS — L97518 Non-pressure chronic ulcer of other part of right foot with other specified severity: Secondary | ICD-10-CM | POA: Insufficient documentation

## 2023-07-24 DIAGNOSIS — E1151 Type 2 diabetes mellitus with diabetic peripheral angiopathy without gangrene: Secondary | ICD-10-CM | POA: Diagnosis not present

## 2023-07-24 DIAGNOSIS — Z950 Presence of cardiac pacemaker: Secondary | ICD-10-CM | POA: Diagnosis not present

## 2023-07-24 DIAGNOSIS — I10 Essential (primary) hypertension: Secondary | ICD-10-CM | POA: Diagnosis not present

## 2023-07-24 DIAGNOSIS — D631 Anemia in chronic kidney disease: Secondary | ICD-10-CM | POA: Insufficient documentation

## 2023-07-24 DIAGNOSIS — N1831 Chronic kidney disease, stage 3a: Secondary | ICD-10-CM

## 2023-07-24 DIAGNOSIS — I130 Hypertensive heart and chronic kidney disease with heart failure and stage 1 through stage 4 chronic kidney disease, or unspecified chronic kidney disease: Secondary | ICD-10-CM | POA: Diagnosis present

## 2023-07-24 DIAGNOSIS — Z7982 Long term (current) use of aspirin: Secondary | ICD-10-CM | POA: Diagnosis not present

## 2023-07-24 DIAGNOSIS — E785 Hyperlipidemia, unspecified: Secondary | ICD-10-CM | POA: Diagnosis not present

## 2023-07-24 DIAGNOSIS — I428 Other cardiomyopathies: Secondary | ICD-10-CM | POA: Insufficient documentation

## 2023-07-24 DIAGNOSIS — I4819 Other persistent atrial fibrillation: Secondary | ICD-10-CM | POA: Diagnosis not present

## 2023-07-24 DIAGNOSIS — E11621 Type 2 diabetes mellitus with foot ulcer: Secondary | ICD-10-CM | POA: Diagnosis not present

## 2023-07-24 DIAGNOSIS — E1122 Type 2 diabetes mellitus with diabetic chronic kidney disease: Secondary | ICD-10-CM | POA: Insufficient documentation

## 2023-07-24 DIAGNOSIS — R9431 Abnormal electrocardiogram [ECG] [EKG]: Secondary | ICD-10-CM | POA: Diagnosis not present

## 2023-07-24 NOTE — Progress Notes (Signed)
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   Cardiac history dates back to 06/21 when he was first diagnosed with HF. Had also recently been diagnosed with atrial fibrillation.   Admitted 10/30/22 due to infection of right third toe. Noted his right third toe turning dark few days ago and complained of pain in the affected area as well as fever. Right foot x-ray showed soft tissue edema overlies the dorsum of the foot. No soft tissue gas or radiopaque foreign body. No radiographic findings of osteomyelitis. IV antibiotics given. ABI abn. Vascular consulted. RLE angiography done. Amputation  of R 3rd toe done. Post-op had significant epistaxis L nare requiring insertion rhino rocket packing overnight, holding heparin/ASA. Restarted at discharge. Admitted 07/02/23 due to shortness of breath and worsening leg edema. Chronic diabetic foot ulcers. Found to have cellulitis involving the right lower extremity along with an elevated BNP of 1066. Patient was seen by podiatrist and underwent bilateral lower extremity imaging however they did not show any evidence of underlying osteomyelitis. Cardiology consulted. IV diuresed with transition to oral diuretics. Was in the ED 07/19/23 due to mechanical fall where he slipped and hit his knees on the cabinet. No head injury or LOC.    Echo 09/20/20: EF 55-60% with 3+ TR Echo 07/03/23: EF 30-35% with moderate MR  He presents today for a follow-up HF visit with a chief complaint of minimal shortness of  breath with moderate exertion. Chronic in nature. Was able to walk into the office from the parking lost without issues. Says that he walks up a flight of stairs at home and is able to do that but just has to take him time. Has associated fatigue, chronic sternal pain and pedal edema along with this. Denies palpitations, cough, dizziness or difficulty sleeping. He says that the leg edema is better first thing in the morning but then quickly returns. Unable to tolerate compression socks.   Was unable to tolerate spironolactone as he says that it made him "too dizzy and funny feeling" so he stopped it and symptoms resolved. Did recently drink Mtn Dew and ate a pizza.    Would like to hold off adjusting medications at this time as he's leery of trying anything else.    ROS: All systems negative except as listed in HPI, PMH and Problem List.  SH:  Social History   Socioeconomic History   Marital status: Single    Spouse name: Not on file   Number of children: Not on file   Years of education: Not on file   Highest education level: Not on file  Occupational History   Not on file  Tobacco Use   Smoking status: Never   Smokeless tobacco: Never  Vaping Use   Vaping status: Never Used  Substance and Sexual Activity   Alcohol use: Not Currently    Comment: rarely   Drug use: Never   Sexual activity: Not Currently    Birth control/protection: None  Other Topics Concern   Not on file  Social History Narrative   Lives with roommate  Social Determinants of Health   Financial Resource Strain: Low Risk  (03/20/2023)   Received from [facility], [facility]   Overall Financial Resource Strain (CARDIA)    Difficulty of Paying Living Expenses: Not hard at all  Food Insecurity: No Food Insecurity (03/20/2023)   Received from [facility], [facility]   Hunger Vital Sign    Worried About Running Out of Food in the Last Year: Never true    Ran Out of Food in the Last Year: Never true   Transportation Needs: No Transportation Needs (03/20/2023)   Received from [facility], [facility]   PRAPARE - Transportation    Lack of Transportation (Medical): No    Lack of Transportation (Non-Medical): No  Physical Activity: Not on file  Stress: No Stress Concern Present (09/07/2020)   Received from [facility], [facility] - Occupational Stress Questionnaire    Feeling of Stress : Not at all  Social Connections: Unknown (02/01/2022)   Received from [facility], [facility]   Social Network    Social Network: Not on file  Intimate Partner Violence: Not At Risk (10/30/2022)   Humiliation, Afraid, Rape, and Kick questionnaire    Fear of Current or Ex-Partner: No    Emotionally Abused: No    Physically Abused: No    Sexually Abused: No    FH:  Family History  Problem Relation Age of Onset   Cancer Niece     Past Medical History:  Diagnosis Date   (HFpEF) heart failure with preserved ejection fraction (HCC) 03/01/2020   a.) TTE 03/01/2020: EF 55-60%, mod MAC, mod AoV sclerosis, triv AR, mild TR, mod MR, RVSP 50-59; b.) TTE 09/10/2020: EF 55-60%, mod MAC, mod AoV sclerosis, mild TR, 3+ MR, RVSP 50-59; c.) TTE 09/26/2020: EF 55-60%, mild LA dil, triv PR, mild MR/TR, RVSP 37-49   Adenoma of left adrenal gland    Anemia    Arthritis    Atrial fibrillation and flutter (HCC)    a.) CHA2DS2VASc = 5 (age x2, HFpEF, HTN, T2DM);  b.) s/p CTI ablation 09/07/2020; c.) rate/rhythm maintained on oral carvedilol; not on chronic anticoagulation therapy   CAD (coronary artery disease)    Cardiomegaly    CKD (chronic kidney disease), stage III (HCC)    DOE (dyspnea on exertion)    Drug-induced bradycardia    Gangrene of toe of left foot (HCC)    a.) 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    HLD (hyperlipidemia)    Hypertension    Long term current use of aspirin     Lymphedema of both lower extremities    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)    a.) s/p amputation 11/04/2022   Peripheral vascular disease (HCC)    Pleural effusion on right 09/09/2020   a.) s/p RIGHT thoracentesis with 2180 cc yield   Pneumonia    Presence of permanent cardiac pacemaker 09/10/2020   a.) TVP placement 09/10/2020 due to intermittent CHB in setting of urosepsis; b.) s/p PPM placement 09/15/2020: MDT Azure XT DR (SN: [id number])   Pulmonary hypertension (HCC) 03/01/2020   a.) TTE: 03/01/2020: RVSP 50-59; b.) TTE 09/10/2020: RVSP 50-59; c.) TTE 09/26/2020: RVSP 37-49   RA (rheumatoid arthritis) (HCC)    Rheumatic fever    Sepsis (HCC) 09/10/2020   a.) urosepsis --> BC x 2 sets and UC all grew out significant  Proteus mirabilis; admitted to [facility] 09/07/2020 - 09/27/2020.   Sick sinus syndrome [facility])    a.) s/p MDT PPM placement 09/15/2020   T2DM (type 2 diabetes mellitus) (HCC)    Urinary retention    chronic, with indwelling Foley catheter and plans for a suprapubic   Wears dentures    full upper    Current Outpatient Medications  Medication Sig Dispense Refill   amoxicillin-clavulanate (AUGMENTIN) 875-125 MG tablet Take 1 tablet by mouth 2 (two) times daily. 28 tablet 0   aspirin EC 81 MG tablet Take 1 tablet (81 mg total) by mouth daily. Swallow whole. 30 tablet 0   carvedilol (COREG) 3.125 MG tablet Take 3.125 mg by mouth 2 (two) times daily.     furosemide (LASIX) 40 MG tablet Take 40 mg by mouth daily.     gentamicin cream (GARAMYCIN) 0.1 % Apply 1 Application topically 2 (two) times daily. 30 g 1   glipiZIDE (GLUCOTROL) 10 MG tablet Take 10 mg by mouth 2 (two) times daily.     metFORMIN (GLUCOPHAGE) 1000 MG tablet Take 1,000 mg by mouth 2 (two) times daily.     metoprolol succinate (TOPROL-XL) 50 MG 24 hr tablet Take 1 tablet (50 mg total) by mouth daily. Take with or immediately following a meal. 60 tablet 0   Multiple  Vitamins-Minerals (MULTIVITAMIN WITH MINERALS) tablet Take 1 tablet by mouth daily.     OVER THE COUNTER MEDICATION 1 Scoop daily. Super Beets Powder     silver sulfADIAZINE (SILVADENE) 1 % cream Apply to affected area daily 50 g 1   spironolactone (ALDACTONE) 25 MG tablet Take 1 tablet (25 mg total) by mouth daily. 90 tablet 1   traMADol (ULTRAM) 50 MG tablet Take 1 tablet (50 mg total) by mouth 3 (three) times daily with meals as needed for up to 5 days. 15 tablet 0   No current facility-administered medications for this visit.   Vitals:   07/24/23 1441  BP: 132/65  Pulse: 85  SpO2: 94%  Weight: 254 lb (115.2 kg)  Height: 6\' 6"  (1.981 m)   Wt Readings from Last 3 Encounters:  07/24/23 254 lb (115.2 kg)  07/15/23 250 lb (113.4 kg)  07/06/23 235 lb 7.2 oz (106.8 kg)   Lab Results  Component Value Date   CREATININE 1.25 (H) 07/07/2023   CREATININE 1.22 07/06/2023   CREATININE 1.38 (H) 07/05/2023   PHYSICAL EXAM:  General:  Well appearing. No resp difficulty HEENT: normal Neck: supple. JVP flat. No lymphadenopathy or thryomegaly appreciated. Cor: PMI normal. Regular rate & rhythm. No rubs, gallops or murmurs. Lungs: clear Abdomen: soft, nontender, nondistended. No hepatosplenomegaly. No bruits or masses.  Extremities: no cyanosis, clubbing, rash, 3+ pitting edema bilateral lower legs Neuro: alert & oriented x3, cranial nerves grossly intact. Moves all 4 extremities w/o difficulty. Affect pleasant.   ECG: atrial sensed v-paced with HR 82 (personally reviewed)   ASSESSMENT & PLAN:  1: NICM with reduced ejection fraction- - suspect due to HTN/ atrial fibrillation - NYHA class II - euvolemic - weighing daily; reminded to call for overnight weight gain of > 2 pounds or a weekly weight gain of > 5 pounds - weight up 4 pounds from last visit here 10 days ago (recently ate pizza) - Echo 09/20/20: EF 55-60% with 3+ TR - Echo 07/03/23: EF 30-35% with moderate MR - continue  furosemide 40mg  daily; if he experiences above weight gain on a weekend/ holiday, he can take an  extra furosemide and then contact us on the first business day - continue metolazone 2.5mg  weekly - continue metoprolol succinate 50mg  daily - he had to stop spironolactone due to it making him feel too dizzy; he says that he would be unable to cut it in half due to his arthritis - discussed SGLT2/ entresto but he prefers to not make any further adjustments at this time - BNP 07/02/23 was 1066.9  2: HTN- - BP 132/65 - saw PCP (McClanahan) 11/23 - BMP 07/07/23 showed sodium 138, potassium 3.9, creatinine 1.25 & GFR 59  3: Atrial fibrillation- - [facility] 06/24; to get established with Dr. Darrold Junker - ablation 09/07/20 - continue ASA 81mg  - eliquis caused dizziness & vision changes - EKG today was v-paced  4: DM- - A1c 07/02/23 was 7.2% - saw podiatry last week - continue glipizide 10mg  BID - continue metformin 1000mg  BID  5: Mobitz type 2 AV block- - DDD MDT PPM placed 09/15/20  - <0.1% time in AT/AF - atrial impedance 437 ohms - RV impedance 532 ohms  6: Lymphedema- - stage 2 - limited in his ability to exercise due to edema - tried compression socks in the past but they became too tight and cut into his legs - edema persists even with elevation in the bed overnight - discussed lymphapress compression boots but he will think about it and we can discuss further at his next OV   Return in 1 month, sooner if needed. Emphasized that he take today's medication list and compare it to his bottles at home as I'm not sure we have an accurate list.

## 2023-07-24 NOTE — Patient Instructions (Signed)
Please call Dr. Darrold Junker office at [phone] to get a cardiology appointment scheduled.

## 2023-08-03 ENCOUNTER — Emergency Department: Payer: BC Managed Care – PPO

## 2023-08-03 ENCOUNTER — Emergency Department
Admission: EM | Admit: 2023-08-03 | Discharge: 2023-08-03 | Disposition: A | Discharge status: Home / Self Care | Payer: BC Managed Care – PPO | Attending: Emergency Medicine | Admitting: Emergency Medicine

## 2023-08-03 ENCOUNTER — Other Ambulatory Visit: Payer: Self-pay

## 2023-08-03 DIAGNOSIS — R600 Localized edema: Secondary | ICD-10-CM

## 2023-08-03 DIAGNOSIS — J90 Pleural effusion, not elsewhere classified: Secondary | ICD-10-CM | POA: Diagnosis not present

## 2023-08-03 DIAGNOSIS — I509 Heart failure, unspecified: Secondary | ICD-10-CM | POA: Diagnosis not present

## 2023-08-03 DIAGNOSIS — I517 Cardiomegaly: Secondary | ICD-10-CM | POA: Diagnosis not present

## 2023-08-03 DIAGNOSIS — J810 Acute pulmonary edema: Secondary | ICD-10-CM | POA: Diagnosis not present

## 2023-08-03 DIAGNOSIS — R0989 Other specified symptoms and signs involving the circulatory and respiratory systems: Secondary | ICD-10-CM | POA: Diagnosis not present

## 2023-08-03 DIAGNOSIS — R0609 Other forms of dyspnea: Secondary | ICD-10-CM | POA: Diagnosis not present

## 2023-08-03 DIAGNOSIS — R0602 Shortness of breath: Secondary | ICD-10-CM | POA: Diagnosis not present

## 2023-08-03 LAB — BASIC METABOLIC PANEL
Anion gap: 10 (ref 5–15)
BUN: 51 mg/dL — ABNORMAL HIGH (ref 8–23)
CO2: 22 mmol/L (ref 22–32)
Calcium: 8.9 mg/dL (ref 8.9–10.3)
Chloride: 102 mmol/L (ref 98–111)
Creatinine, Ser: 1.56 mg/dL — ABNORMAL HIGH (ref 0.61–1.24)
GFR, Estimated: 45 mL/min — ABNORMAL LOW (ref >60)
Glucose, Bld: 273 mg/dL — ABNORMAL HIGH (ref 70–99)
Potassium: 4.3 mmol/L (ref 3.5–5.1)
Sodium: 134 mmol/L — ABNORMAL LOW (ref 135–145)

## 2023-08-03 LAB — CBC
HCT: 33.8 % — ABNORMAL LOW (ref 39.0–52.0)
Hemoglobin: 10.8 g/dL — ABNORMAL LOW (ref 13.0–17.0)
MCH: 29.1 pg (ref 26.0–34.0)
MCHC: 32 g/dL (ref 30.0–36.0)
MCV: 91.1 fL (ref 80.0–100.0)
Platelets: 245 10*3/uL (ref 150–400)
RBC: 3.71 MIL/uL — ABNORMAL LOW (ref 4.22–5.81)
RDW: 15.5 % (ref 11.5–15.5)
WBC: 9.2 10*3/uL (ref 4.0–10.5)
nRBC: 0 % (ref 0.0–0.2)

## 2023-08-03 LAB — TROPONIN I (HIGH SENSITIVITY)
Troponin I (High Sensitivity): 27 ng/L — ABNORMAL HIGH (ref <18)
Troponin I (High Sensitivity): 25 ng/L — ABNORMAL HIGH (ref <18)

## 2023-08-03 MED ORDER — FUROSEMIDE 40 MG PO TABS: 40.0000 mg | ORAL_TABLET | Freq: Two times a day (BID) | ORAL | 0 refills | Status: DC

## 2023-08-03 NOTE — ED Triage Notes (Signed)
Pt to ED for SOB "for weeks". Pt appears SOB with walking small distance. Denies hx lung problems. States also cannot sleep. Pt is tachypneic in triage, 36. 96% on RA.

## 2023-08-03 NOTE — ED Notes (Signed)
Pt taken for X-ray at this time.  

## 2023-08-03 NOTE — ED Provider Notes (Signed)
[facility] Provider Note   Event Date/Time   First MD Initiated Contact with Patient 08/03/23 1825     (approximate) History  Shortness of Breath  HPI Taysean Stanczak Curci is a 77 y.o. male with a stated past medical history of heart failure who presents complaining of shortness of breath on exertion and bilateral lower extremity edema that is been worsening over the last few days.  Patient states that he has been taking his medications on time and as prescribed with continued worsening swelling.  Patient denies being on any fluid restriction at this time. ROS: Patient currently denies any vision changes, tinnitus, difficulty speaking, facial droop, sore throat, chest pain,  abdominal pain, nausea/vomiting/diarrhea, dysuria, or weakness/numbness/paresthesias in any extremity   Physical Exam  Triage Vital Signs: ED Triage Vitals  Encounter Vitals Group     BP 08/03/23 1809 (!) 165/95     Systolic BP Percentile --      Diastolic BP Percentile --      Pulse Rate 08/03/23 1809 (!) 105     Resp 08/03/23 1809 (!) 36     Temp 08/03/23 1809 97.8 F (36.6 C)     Temp Source 08/03/23 1809 Oral     SpO2 08/03/23 1809 96 %     Weight 08/03/23 1809 253 lb 8.5 oz (115 kg)     Height 08/03/23 1809 6\' 6"  (1.981 m)     Head Circumference --      Peak Flow --      Pain Score 08/03/23 1810 0     Pain Loc --      Pain Education --      Exclude from Growth Chart --    Most recent vital signs: Vitals:   08/03/23 2030 08/03/23 2100  BP: 130/79 (!) 142/77  Pulse: 92 93  Resp: (!) 22 (!) 27  Temp:    SpO2: 94% 96%   General: Awake, oriented x4. CV:  Good peripheral perfusion.  Resp:  Normal effort.  Abd:  No distention.  Other:  Elderly overweight Caucasian male resting comfortably in no acute distress.  Bilateral lower extremity edema ED Results / Procedures / Treatments  Labs (all labs ordered are listed, but only abnormal results are displayed) Labs Reviewed  BASIC  METABOLIC PANEL - Abnormal; Notable for the following components:      Result Value   Sodium 134 (*)    Glucose, Bld 273 (*)    BUN 51 (*)    Creatinine, Ser 1.56 (*)    GFR, Estimated 45 (*)    All other components within normal limits  CBC - Abnormal; Notable for the following components:   RBC 3.71 (*)    Hemoglobin 10.8 (*)    HCT 33.8 (*)    All other components within normal limits  TROPONIN I (HIGH SENSITIVITY) - Abnormal; Notable for the following components:   Troponin I (High Sensitivity) 27 (*)    All other components within normal limits  TROPONIN I (HIGH SENSITIVITY) - Abnormal; Notable for the following components:   Troponin I (High Sensitivity) 25 (*)    All other components within normal limits   EKG ED ECG REPORT I, Merwyn Katos, the attending physician, personally viewed and interpreted this ECG. Date: 08/03/2023 EKG Time: 1824 Rate: 98 Rhythm: normal sinus rhythm  QRS Axis: normal Intervals: Left bundle branch block ST/T Wave abnormalities: normal Narrative Interpretation: Normal sinus rhythm with left bundle branch block and multiple PVCs.  No evidence of acute ischemia RADIOLOGY ED MD interpretation: 2 view chest x-ray interpreted independently and shows cardiomegaly with finding suggestive of pulmonary vascular congestion and interstitial edema.  Patient has bilateral pleural effusions with left greater than right -Agree with radiology assessment Official radiology report(s): DG Chest 2 View  Result Date: 08/03/2023 CLINICAL DATA:  Shortness of breath. EXAM: CHEST - 2 VIEW COMPARISON:  Chest radiograph dated July 02, 2023. FINDINGS: Stable cardiomegaly. Stable left subclavian dual lead pacemaker. Aortic atherosclerosis. Central pulmonary vascular congestion with bilateral interstitial opacities. Small bilateral pleural effusions, more pronounced on the left. No pneumothorax. No acute osseous abnormality. IMPRESSION: 1. Cardiomegaly with findings  suggestive of pulmonary vascular congestion and interstitial edema. 2. Small bilateral pleural effusions, left-greater-than-right. Electronically Signed   By: Hart Robinsons M.D.   On: 08/03/2023 20:27   PROCEDURES: Critical Care performed: No .1-3 Lead EKG Interpretation  Performed by: Merwyn Katos, MD Authorized by: Merwyn Katos, MD     Interpretation: normal     ECG rate:  91   ECG rate assessment: normal     Rhythm: sinus rhythm     Ectopy: none     Conduction: normal    MEDICATIONS ORDERED IN ED: Medications - No data to display IMPRESSION / MDM / ASSESSMENT AND PLAN / ED COURSE  I reviewed the triage vital signs and the nursing notes.                             The patient is on the cardiac monitor to evaluate for evidence of arrhythmia and/or significant heart rate changes. Patient's presentation is most consistent with acute presentation with potential threat to life or bodily function. Endorses dyspnea, endorses LE edema Denies Non adherence to medication regimen  Workup: ECG, CBC, BMP, Troponin, CXR Findings: EKG: No STEMI and no evidence of Brugada's sign, delta wave, epsilon wave, significantly prolonged QTc, or malignant arrhythmia. CXR: Pulmonary edema with bilateral pleural effusions Based on history, exam and findings, presentation most consistent with acute on chronic heart failure. Low suspicion for PNA, ACS, tamponade, aortic dissection. Interventions: Diuresis as patient is not requiring supplemental oxygen at this time  Reassessment: Symptoms improved in ED with diuresis.  Disposition (Stable): Discharge   FINAL CLINICAL IMPRESSION(S) / ED DIAGNOSES   Final diagnoses:  DOE (dyspnea on exertion)  Acute pulmonary edema (HCC)  Bilateral lower extremity edema   Rx / DC Orders   ED Discharge Orders          Ordered    furosemide (LASIX) 40 MG tablet  2 times daily        08/03/23 2037           Note:  This document was prepared using  Dragon voice recognition software and may include unintentional dictation errors.   Merwyn Katos, MD 08/03/23 2109

## 2023-08-03 NOTE — Discharge Instructions (Addendum)
Please take lasix 40mg  twice a day for the next 5 days

## 2023-08-05 DIAGNOSIS — R0609 Other forms of dyspnea: Secondary | ICD-10-CM | POA: Diagnosis not present

## 2023-08-05 DIAGNOSIS — I5022 Chronic systolic (congestive) heart failure: Secondary | ICD-10-CM | POA: Diagnosis not present

## 2023-08-06 ENCOUNTER — Ambulatory Visit (INDEPENDENT_AMBULATORY_CARE_PROVIDER_SITE_OTHER): Payer: BC Managed Care – PPO | Admitting: Podiatry

## 2023-08-06 ENCOUNTER — Encounter: Payer: Self-pay | Admitting: Podiatry

## 2023-08-06 VITALS — Ht 78.0 in | Wt 254.0 lb

## 2023-08-06 DIAGNOSIS — L97512 Non-pressure chronic ulcer of other part of right foot with fat layer exposed: Secondary | ICD-10-CM

## 2023-08-06 DIAGNOSIS — L97522 Non-pressure chronic ulcer of other part of left foot with fat layer exposed: Secondary | ICD-10-CM

## 2023-08-06 DIAGNOSIS — E0843 Diabetes mellitus due to underlying condition with diabetic autonomic (poly)neuropathy: Secondary | ICD-10-CM

## 2023-08-06 NOTE — Progress Notes (Signed)
Chief Complaint  Patient presents with   Wound Check     Follow up ulcer hallux right and plantar foot left but look to be healing no infection at this time      Subjective:  77 year old male PMHx T2DM, last A1c was 7.2 on 07/02/2023, history of prior digital amputations presenting for outpatient follow-up regarding an ulcer to the plantar aspect of the right great toe.  Presenting for outpatient follow-up  Past Medical History:  Diagnosis Date   (HFpEF) heart failure with preserved ejection fraction (HCC) 03/01/2020   a.) TTE 03/01/2020: EF 55-60%, mod MAC, mod AoV sclerosis, triv AR, mild TR, mod MR, RVSP 50-59; b.) TTE 09/10/2020: EF 55-60%, mod MAC, mod AoV sclerosis, mild TR, 3+ MR, RVSP 50-59; c.) TTE 09/26/2020: EF 55-60%, mild LA dil, triv PR, mild MR/TR, RVSP 37-49   Adenoma of left adrenal gland    Anemia    Arthritis    Atrial fibrillation and flutter (HCC)    a.) CHA2DS2VASc = 5 (age x2, HFpEF, HTN, T2DM);  b.) s/p CTI ablation 09/07/2020; c.) rate/rhythm maintained on oral carvedilol; not on chronic anticoagulation therapy   CAD (coronary artery disease)    Cardiomegaly    CKD (chronic kidney disease), stage III (HCC)    DOE (dyspnea on exertion)    Drug-induced bradycardia    Gangrene of toe of left foot (HCC)    a.) 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    HLD (hyperlipidemia)    Hypertension    Long term current use of aspirin    Lymphedema of both lower extremities    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)    a.) s/p amputation 11/04/2022   Peripheral vascular disease (HCC)    Pleural effusion on right 09/09/2020   a.) s/p RIGHT thoracentesis with 2180 cc yield   Pneumonia    Presence of permanent cardiac pacemaker 09/10/2020   a.) TVP placement 09/10/2020 due to intermittent CHB in setting of urosepsis; b.) s/p PPM placement 09/15/2020: MDT Azure XT DR (SN: [id number])    Pulmonary hypertension (HCC) 03/01/2020   a.) TTE: 03/01/2020: RVSP 50-59; b.) TTE 09/10/2020: RVSP 50-59; c.) TTE 09/26/2020: RVSP 37-49   RA (rheumatoid arthritis) (HCC)    Rheumatic fever    Sepsis (HCC) 09/10/2020   a.) urosepsis --> BC x 2 sets and UC all grew out significant Proteus mirabilis; admitted to [facility] 09/07/2020 - 09/27/2020.   Sick sinus syndrome [facility])    a.) s/p MDT PPM placement 09/15/2020   T2DM (type 2 diabetes mellitus) (HCC)    Urinary retention    chronic, with indwelling Foley catheter and plans for a suprapubic   Wears dentures    full upper    Past Surgical History:  Procedure Laterality Date   AMPUTATION TOE Right 11/04/2022   Procedure: AMPUTATION TOE;  Surgeon: Felecia Shelling, [name];  [name];  Service: Podiatry;  Laterality: Right;   BONE BIOPSY Right 04/05/2023   Procedure: BONE BIOPSY THIRD & FOURTH;  Surgeon: Felecia Shelling, [name];  [name];  Service: Podiatry;  Laterality: Right;   CARDIAC ELECTROPHYSIOLOGY STUDY AND ABLATION N/A 09/07/2020   Procedure: CARDIAC EP STUDY AND ABLATION (CTI)   CATARACT EXTRACTION W/PHACO Right 01/16/2017   Procedure: CATARACT EXTRACTION PHACO AND INTRAOCULAR LENS PLACEMENT (IOC)  Right Complicated;  Surgeon: Lockie Mola, MD;  Location: [facility];  Service:  Ophthalmology;  Laterality: Right;  IVA Block Healon 5 malyugin vision blue Diabetic - oral meds   CATARACT EXTRACTION W/PHACO Left 10/23/2022   Procedure: CATARACT EXTRACTION PHACO AND INTRAOCULAR LENS PLACEMENT (IOC) LEFT DIABETIC;  Surgeon: Galen Manila, MD;  Location: [facility];  Service: Ophthalmology;  Laterality: Left;  Diabetic   COLONOSCOPY     LOWER EXTREMITY ANGIOGRAPHY Right 11/02/2022   Procedure: Lower Extremity Angiography;  Surgeon: Annice Needy, MD;  Location: [facility];  Service: Cardiovascular;  Laterality: Right;   METATARSAL HEAD EXCISION Left 07/05/2018   Procedure:  RESECTION FIRST METATARSAL INFECTED BONE AND SOFT TISSUE;  Surgeon: Recardo Evangelist, [name];  [name];  Service: Podiatry;  Laterality: Left;   METATARSAL HEAD EXCISION Right 04/05/2023   Procedure: METATARSAL HEAD EXCISION THIRD & FOURTH;  Surgeon: Felecia Shelling, [name];  [name];  Service: Podiatry;  Laterality: Right;   PACEMAKER INSERTION  09/15/2020   TOE AMPUTATION Left    TONSILLECTOMY      Allergies  Allergen Reactions   Eliquis [Apixaban]     Dizziness  and vision change   Spironolactone Other (See Comments)    dizziness    07/09/2023 LT foot  07/09/2023 RT great toe   LT foot 07/16/2023  RT great toe 07/16/2023   LT foot 08/06/2023  RT great toe 08/06/2023  Objective/Physical Exam New onset of ulcer noted to the plantar aspect of the right great toe measuring approximately 2.5 x 2.5 x 0.5 cm.  Fibrotic malodorous wound base noted with extensive amount of necrotic tissue.  It does not appear to extend into the bone or joint.  It does extend to the joint capsule however.  Serous drainage.  No purulence.  Ulcer to the plantar aspect of the left foot mostly unchanged.  measuring approximately 1.5 x 1.5 x 0.3 cm.  Granular wound base.  Currently no exposed bone muscle tendon ligament or joint.  No malodor.  Periwound is callused.  Radiographic exam RT foot 05/03/2023: Stable.  Dorsal subluxation of the second toe at the level of the MTP with hammertoe contracture deformity.  Absence of the third and fourth metatarsal heads with amputation of the third toe.  Osteotomies appear stable.  No obvious sign of erosions or irregularities concerning for osteomyelitis  Radiographic exam LT foot 06/25/2023: Absence of the left great toe noted with erosions noted along the first metatarsal compared to prior x-rays dated 07/03/2018.  Concern for possible osteomyelitis of the first metatarsal  CT FOOT LEFT WO CONTRAST 07/06/2023 IMPRESSION: 1. Unchanged superficial  ulceration along the medial plantar aspect of the midfoot near the base of the medial cuneiform. No CT evidence of osteomyelitis. 2. Prior first ray amputation.  CT FOOT RIGHT WO CONTRAST 07/06/2023 IMPRESSION: 1. Superficial ulceration along the plantar aspect of the first IP joint. No CT evidence of osteomyelitis. 2. Unchanged severe first MTP joint osteoarthritis. 3. 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   Assessment: 1. s/p third metatarsal head resection with bone biopsy right foot; currently stable 2.  Ulcer left foot secondary to diabetes mellitus.   3.  Ulcer plantar aspect of the right great toe  Plan of Care:  -Patient was evaluated.   -Medically necessary excisional debridement including muscle and deep fascial tissue was performed using a tissue nipper to the right great toe ulcer.  Excisional debridement of the necrotic nonviable tissue down to healthier bleeding viable tissue was performed with postdebridement measurement same as pre- -Betadine  soaked wet-to-dry dressing was applied.  Patient would rather leave clean dry and intact x 1 week - Continue WBAT surgical shoes bilateral -Patient has finished Augmentin 875/125 mg prescribed at discharge -Continues to be high risk for osteomyelitis to the right great toe.   -Due to the nature of the wounds I do recommend that the patient goes to the [facility] wound care center for management weekly.  I do believe they can offer the best potential for healing.  Always greatly appreciated -Referral placed to the wound care center at [facility] -Return to clinic with me 6 weeks  Felecia Shelling, [name] [facility]  Dr. Felecia Shelling, [name]    [address] [place], [place] [address]                Office [phone]  Fax [phone]

## 2023-08-09 ENCOUNTER — Ambulatory Visit: Payer: Medicare Other | Admitting: Podiatry

## 2023-08-13 ENCOUNTER — Emergency Department: Payer: BC Managed Care – PPO

## 2023-08-13 ENCOUNTER — Encounter: Payer: Self-pay | Admitting: Emergency Medicine

## 2023-08-13 ENCOUNTER — Other Ambulatory Visit: Payer: Self-pay

## 2023-08-13 ENCOUNTER — Inpatient Hospital Stay
Admission: EM | Admit: 2023-08-13 | Discharge: 2023-08-18 | DRG: 291 | Disposition: A | Discharge status: Home / Self Care | Payer: BC Managed Care – PPO | Attending: Hospitalist | Admitting: Hospitalist

## 2023-08-13 DIAGNOSIS — I2510 Atherosclerotic heart disease of native coronary artery without angina pectoris: Secondary | ICD-10-CM | POA: Diagnosis not present

## 2023-08-13 DIAGNOSIS — Z9842 Cataract extraction status, left eye: Secondary | ICD-10-CM

## 2023-08-13 DIAGNOSIS — Z960 Presence of urogenital implants: Secondary | ICD-10-CM | POA: Diagnosis present

## 2023-08-13 DIAGNOSIS — I110 Hypertensive heart disease with heart failure: Secondary | ICD-10-CM | POA: Diagnosis not present

## 2023-08-13 DIAGNOSIS — I509 Heart failure, unspecified: Secondary | ICD-10-CM | POA: Diagnosis not present

## 2023-08-13 DIAGNOSIS — R0989 Other specified symptoms and signs involving the circulatory and respiratory systems: Secondary | ICD-10-CM | POA: Diagnosis not present

## 2023-08-13 DIAGNOSIS — I10 Essential (primary) hypertension: Secondary | ICD-10-CM | POA: Diagnosis not present

## 2023-08-13 DIAGNOSIS — Z7982 Long term (current) use of aspirin: Secondary | ICD-10-CM | POA: Diagnosis not present

## 2023-08-13 DIAGNOSIS — Z79899 Other long term (current) drug therapy: Secondary | ICD-10-CM

## 2023-08-13 DIAGNOSIS — E1122 Type 2 diabetes mellitus with diabetic chronic kidney disease: Secondary | ICD-10-CM

## 2023-08-13 DIAGNOSIS — Z950 Presence of cardiac pacemaker: Secondary | ICD-10-CM | POA: Diagnosis not present

## 2023-08-13 DIAGNOSIS — I2489 Other forms of acute ischemic heart disease: Secondary | ICD-10-CM | POA: Diagnosis not present

## 2023-08-13 DIAGNOSIS — I130 Hypertensive heart and chronic kidney disease with heart failure and stage 1 through stage 4 chronic kidney disease, or unspecified chronic kidney disease: Secondary | ICD-10-CM | POA: Diagnosis not present

## 2023-08-13 DIAGNOSIS — M79605 Pain in left leg: Secondary | ICD-10-CM | POA: Diagnosis present

## 2023-08-13 DIAGNOSIS — Z89412 Acquired absence of left great toe: Secondary | ICD-10-CM

## 2023-08-13 DIAGNOSIS — I5022 Chronic systolic (congestive) heart failure: Secondary | ICD-10-CM | POA: Diagnosis not present

## 2023-08-13 DIAGNOSIS — R0600 Dyspnea, unspecified: Secondary | ICD-10-CM | POA: Diagnosis not present

## 2023-08-13 DIAGNOSIS — Z9841 Cataract extraction status, right eye: Secondary | ICD-10-CM

## 2023-08-13 DIAGNOSIS — Z7984 Long term (current) use of oral hypoglycemic drugs: Secondary | ICD-10-CM | POA: Diagnosis not present

## 2023-08-13 DIAGNOSIS — S91301A Unspecified open wound, right foot, initial encounter: Secondary | ICD-10-CM | POA: Diagnosis present

## 2023-08-13 DIAGNOSIS — L03119 Cellulitis of unspecified part of limb: Secondary | ICD-10-CM | POA: Diagnosis not present

## 2023-08-13 DIAGNOSIS — J9600 Acute respiratory failure, unspecified whether with hypoxia or hypercapnia: Secondary | ICD-10-CM | POA: Diagnosis not present

## 2023-08-13 DIAGNOSIS — M069 Rheumatoid arthritis, unspecified: Secondary | ICD-10-CM | POA: Diagnosis present

## 2023-08-13 DIAGNOSIS — E871 Hypo-osmolality and hyponatremia: Secondary | ICD-10-CM | POA: Diagnosis not present

## 2023-08-13 DIAGNOSIS — S91302A Unspecified open wound, left foot, initial encounter: Secondary | ICD-10-CM | POA: Diagnosis present

## 2023-08-13 DIAGNOSIS — I872 Venous insufficiency (chronic) (peripheral): Secondary | ICD-10-CM | POA: Diagnosis not present

## 2023-08-13 DIAGNOSIS — N1831 Chronic kidney disease, stage 3a: Secondary | ICD-10-CM | POA: Diagnosis present

## 2023-08-13 DIAGNOSIS — I4819 Other persistent atrial fibrillation: Secondary | ICD-10-CM | POA: Diagnosis present

## 2023-08-13 DIAGNOSIS — I480 Paroxysmal atrial fibrillation: Secondary | ICD-10-CM | POA: Diagnosis not present

## 2023-08-13 DIAGNOSIS — I5023 Acute on chronic systolic (congestive) heart failure: Secondary | ICD-10-CM | POA: Diagnosis present

## 2023-08-13 DIAGNOSIS — Z89421 Acquired absence of other right toe(s): Secondary | ICD-10-CM | POA: Diagnosis not present

## 2023-08-13 DIAGNOSIS — E785 Hyperlipidemia, unspecified: Secondary | ICD-10-CM | POA: Diagnosis present

## 2023-08-13 DIAGNOSIS — E1151 Type 2 diabetes mellitus with diabetic peripheral angiopathy without gangrene: Secondary | ICD-10-CM | POA: Diagnosis not present

## 2023-08-13 DIAGNOSIS — R0602 Shortness of breath: Secondary | ICD-10-CM | POA: Diagnosis not present

## 2023-08-13 DIAGNOSIS — I517 Cardiomegaly: Secondary | ICD-10-CM | POA: Diagnosis not present

## 2023-08-13 LAB — TROPONIN I (HIGH SENSITIVITY): Troponin I (High Sensitivity): 112 ng/L (ref <18)

## 2023-08-13 LAB — BASIC METABOLIC PANEL
Anion gap: 10 (ref 5–15)
BUN: 49 mg/dL — ABNORMAL HIGH (ref 8–23)
CO2: 20 mmol/L — ABNORMAL LOW (ref 22–32)
Calcium: 8.5 mg/dL — ABNORMAL LOW (ref 8.9–10.3)
Chloride: 103 mmol/L (ref 98–111)
Creatinine, Ser: 1.57 mg/dL — ABNORMAL HIGH (ref 0.61–1.24)
GFR, Estimated: 45 mL/min — ABNORMAL LOW (ref >60)
Glucose, Bld: 297 mg/dL — ABNORMAL HIGH (ref 70–99)
Potassium: 3.9 mmol/L (ref 3.5–5.1)
Sodium: 133 mmol/L — ABNORMAL LOW (ref 135–145)

## 2023-08-13 LAB — CBC
HCT: 32.8 % — ABNORMAL LOW (ref 39.0–52.0)
Hemoglobin: 10.4 g/dL — ABNORMAL LOW (ref 13.0–17.0)
MCH: 28.6 pg (ref 26.0–34.0)
MCHC: 31.7 g/dL (ref 30.0–36.0)
MCV: 90.1 fL (ref 80.0–100.0)
Platelets: 241 10*3/uL (ref 150–400)
RBC: 3.64 MIL/uL — ABNORMAL LOW (ref 4.22–5.81)
RDW: 15.2 % (ref 11.5–15.5)
WBC: 8.9 10*3/uL (ref 4.0–10.5)
nRBC: 0 % (ref 0.0–0.2)

## 2023-08-13 MED ORDER — FUROSEMIDE 10 MG/ML IJ SOLN
60.0000 mg | Freq: Once | INTRAMUSCULAR | Status: AC
  Administered 2023-08-13: 60 mg via INTRAVENOUS
  Filled 2023-08-13: qty 8

## 2023-08-13 NOTE — ED Notes (Signed)
Report given to April, RN

## 2023-08-13 NOTE — ED Provider Notes (Signed)
[facility] Provider Note    Event Date/Time   First MD Initiated Contact with Patient 08/13/23 2055     (approximate)  History   Chief Complaint: Shortness of Breath  HPI  Tyler [name] is a 77 y.o. male with a past medical history of CHF, anemia, hypertension, hyperlipidemia, diabetes, presents to the emergency department for continued shortness of breath with cough and sputum.  According to the patient for the past month or so now he has had shortness of breath with exertion as well as cough and white sputum production at times.  Patient states he has seen his doctor with no new medications started.  Patient states they told him he needs to "learn to live with it."  Patient denies any chest pain at any point.  No fever.  Patient does have significant lower extremity edema but states this is fairly baseline for him.  Patient states he used to go to the heart failure clinic but states he has not gone in quite some time.  Physical Exam   Triage Vital Signs: ED Triage Vitals  Encounter Vitals Group     BP 08/13/23 1549 136/75     Systolic BP Percentile --      Diastolic BP Percentile --      Pulse Rate 08/13/23 1549 (!) 103     Resp 08/13/23 1549 (!) 22     Temp 08/13/23 1549 (!) 97.5 F (36.4 C)     Temp Source 08/13/23 1549 Oral     SpO2 08/13/23 1549 96 %     Weight 08/13/23 1550 250 lb (113.4 kg)     Height 08/13/23 1550 6\' 6"  (1.981 m)     Head Circumference --      Peak Flow --      Pain Score 08/13/23 1549 8     Pain Loc --      Pain Education --      Exclude from Growth Chart --     Most recent vital signs: Vitals:   08/13/23 1809 08/13/23 2052  BP:  (!) 153/92  Pulse: 98 (!) 103  Resp: 18 15  Temp:    SpO2:  98%    General: Awake, no distress.  CV:  Good peripheral perfusion.  Regular rate and rhythm  Resp:  Normal effort.  Equal breath sounds bilaterally.  Abd:  No distention.  Soft, nontender.  No rebound or  guarding. Other:  3-4+ lower extremity IMA bilaterally.   ED Results / Procedures / Treatments   EKG  EKG viewed and interpreted by myself shows a ventricular paced rhythm at 97 bpm with a widened QRS, left axis deviation with nonspecific ST changes.  RADIOLOGY  I have reviewed and interpreted chest x-ray images.  Possible mild edema on my exam.  No obvious consolidation.  Pacemaker in place.   MEDICATIONS ORDERED IN ED: Medications  furosemide (LASIX) injection 60 mg (60 mg Intravenous Given 08/13/23 2118)     IMPRESSION / MDM / ASSESSMENT AND PLAN / ED COURSE  I reviewed the triage vital signs and the nursing notes.  Patient's presentation is most consistent with acute presentation with potential threat to life or bodily function.  Patient presents to the emergency department for shortness of breath for the past 1 month.  Patient has sputum production at times.  Significant lower extremity edema although not significantly changed from baseline per patient.  Patient states he does take Lasix each day.  He stopped following  up with the heart failure clinic.  I discussed with the patient the importance of continuing to follow-up with the heart failure clinic as they can often do IV Lasix and other interventions that could hopefully make him more comfortable and prevent hospitalization.  Patient is agreeable to continue to follow-up with them.  Patient's workup in the emergency department shows a reassuring CBC, reassuring chemistry.  Troponin is pending.  We will dose IV Lasix given congestion on chest x-ray.  Patient is satting in the upper 90s on room air.  As long as the patient's troponin is normal/unchanged from baseline anticipate likely discharge home with CHF clinic follow-up and increasing the patient's home Lasix for the next 5 days.  Patient is agreeable to this plan.  Patient's troponin is elevated to 112, baseline troponin seems to be closer to 25.  Given the elevated troponin  DVT continued and worsening shortness of breath sputum production likely indicative of CHF exacerbation we will dose IV Lasix and admit to the hospital service for further workup and treatment.  FINAL CLINICAL IMPRESSION(S) / ED DIAGNOSES   CHF exacerbation Dyspnea    Note:  This document was prepared using Dragon voice recognition software and may include unintentional dictation errors.   Minna Antis, MD 08/13/23 2223

## 2023-08-13 NOTE — ED Triage Notes (Signed)
Patient to ED via POV for SOB for the past few weeks. Dyspnea with exertion noted. States he has been congested lately. Speaking in full sentences. Hx of diabetes

## 2023-08-13 NOTE — ED Notes (Signed)
MD at bedside. 

## 2023-08-13 NOTE — ED Notes (Signed)
AAOx3.  Skin warm and dry. No SOB/ DOE.  Voice clear and strong.

## 2023-08-13 NOTE — ED Notes (Signed)
Called CCMD for tele monitoring

## 2023-08-13 NOTE — H&P (Signed)
[place]   PATIENT NAME: Tyler [name]    MR#:  [id number]  DATE OF BIRTH:  October 17, 1945  DATE OF ADMISSION:  08/13/2023  PRIMARY CARE PHYSICIAN: Elder Negus, NP   Patient is coming from: Home  REQUESTING/REFERRING PHYSICIAN: Minna Antis, MD  CHIEF COMPLAINT:   Chief Complaint  Patient presents with  . Shortness of Breath    HISTORY OF PRESENT ILLNESS:  Tyler [name] is a 77 y.o. Caucasian male with medical history significant for HFrEF, stage III CKD, CAD, osteoarthritis, hypertension, dyslipidemia and rheumatic fever, as well as type 2 diabetes mellitus, who presented to the emergency room with acute onset of worsening dyspnea with associated orthopnea and paroxysmal nocturnal dyspnea over the last few days.  He admitted to mild dry cough with occasional wheezing.  He has been having dyspnea on exertion.  No fever or chills.  No nausea or vomiting or abdominal pain.  No dysuria, oliguria or hematuria or flank pain.  ED Course: When the patient came to the ER, BP was 153/92 with heart rate of 98 and later 103 and vital signs were otherwise within normal.  Labs revealed mild hyponatremia and a CO2 of 20 with blood glucose of 297, BUN of 49 with creatinine 1.57 compared to previous levels.  BNP was 1900 and 04.9 and high sensitive troponin I was 112.  CBC showed anemia close to baseline. EKG as reviewed by me : -Urgency EKG showed sensed ventricular paced rhythm with PACs and aberrant conduction with a rate of 97. Imaging: Two-view chest x-ray showed stable vascular congestion.  The patient was given 60 mg of IV Lasix.  He will be admitted to a cardiac telemetry bed for further evaluation and management. PAST MEDICAL HISTORY:   Past Medical History:  Diagnosis Date  . (HFpEF) heart failure with preserved ejection fraction (HCC) 03/01/2020   a.) TTE 03/01/2020: EF 55-60%, mod MAC, mod AoV sclerosis, triv AR, mild TR, mod MR, RVSP 50-59; b.) TTE 09/10/2020: EF  55-60%, mod MAC, mod AoV sclerosis, mild TR, 3+ MR, RVSP 50-59; c.) TTE 09/26/2020: EF 55-60%, mild LA dil, triv PR, mild MR/TR, RVSP 37-49  . Adenoma of left adrenal gland   . Anemia   . Arthritis   . Atrial fibrillation and flutter (HCC)    a.) CHA2DS2VASc = 5 (age x2, HFpEF, HTN, T2DM);  b.) s/p CTI ablation 09/07/2020; c.) rate/rhythm maintained on oral carvedilol; not on chronic anticoagulation therapy  . CAD (coronary artery disease)   . Cardiomegaly   . CKD (chronic kidney disease), stage III (HCC)   . DOE (dyspnea on exertion)   . Drug-induced bradycardia   . Gangrene of toe of left foot (HCC)    a.) 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  . Hepatosplenomegaly   . History of bilateral cataract extraction   . HLD (hyperlipidemia)   . Hypertension   . Long term current use of aspirin   . Lymphedema of both lower extremities   . 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 Peripheral vascular disease (HCC)   . Pleural effusion on right 09/09/2020   a.) s/p RIGHT thoracentesis with 2180 cc yield  . Pneumonia   . 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 Pulmonary hypertension (HCC) 03/01/2020  a.) TTE: 03/01/2020: RVSP 50-59; b.) TTE 09/10/2020: RVSP 50-59; c.) TTE 09/26/2020: RVSP 37-49  . RA (rheumatoid arthritis) (HCC)   . Rheumatic fever   . Sepsis (HCC) 09/10/2020   a.) urosepsis --> BC x 2 sets and UC all grew out significant Proteus mirabilis; admitted to [facility] 09/07/2020 - 09/27/2020.  . Sick sinus syndrome (HCC)    a.) s/p MDT PPM placement 09/15/2020  . T2DM (type 2 diabetes mellitus) (HCC)   . Urinary retention    chronic, with indwelling Foley catheter and plans for a suprapubic  . Wears dentures    full upper    PAST SURGICAL HISTORY:   Past  Surgical History:  Procedure Laterality Date  . AMPUTATION TOE Right 11/04/2022   Procedure: AMPUTATION TOE;  Surgeon: Felecia Shelling, [name];  [name];  Service: Podiatry;  Laterality: Right;  . BONE BIOPSY Right 04/05/2023   Procedure: BONE BIOPSY THIRD & FOURTH;  Surgeon: Felecia Shelling, [name];  [name];  Service: Podiatry;  Laterality: Right;  . CARDIAC ELECTROPHYSIOLOGY STUDY AND ABLATION N/A 09/07/2020   Procedure: CARDIAC EP STUDY AND ABLATION (CTI)  . CATARACT EXTRACTION W/PHACO Right 01/16/2017   Procedure: CATARACT EXTRACTION PHACO AND INTRAOCULAR LENS PLACEMENT (IOC)  Right Complicated;  Surgeon: Lockie Mola, MD;  Location: [facility];  Service: Ophthalmology;  Laterality: Right;  IVA Block Healon 5 malyugin vision blue Diabetic - oral meds  . CATARACT EXTRACTION W/PHACO Left 10/23/2022   Procedure: CATARACT EXTRACTION PHACO AND INTRAOCULAR LENS PLACEMENT (IOC) LEFT DIABETIC;  Surgeon: Galen Manila, MD;  Location: [facility];  Service: Ophthalmology;  Laterality: Left;  Diabetic  . COLONOSCOPY    . LOWER EXTREMITY ANGIOGRAPHY Right 11/02/2022   Procedure: Lower Extremity Angiography;  Surgeon: Annice Needy, MD;  Location: [facility] IN[facility];  Service: Cardiovascular;  Laterality: Right;  . METATARSAL HEAD EXCISION Left 07/05/2018   Procedure: RESECTION FIRST METATARSAL INFECTED BONE AND SOFT TISSUE;  Surgeon: Recardo Evangelist, [name];  [name];  Service: Podiatry;  Laterality: Left;  . METATARSAL HEAD EXCISION Right 04/05/2023   Procedure: METATARSAL HEAD EXCISION THIRD & FOURTH;  Surgeon: Felecia Shelling, [name];  [name];  Service: Podiatry;  Laterality: Right;  . PACEMAKER INSERTION  09/15/2020  . TOE AMPUTATION Left   . TONSILLECTOMY      SOCIAL HISTORY:   Social History   Tobacco Use  . Smoking status: Never  . Smokeless tobacco: Never  Substance Use Topics  . Alcohol use: Not Currently    Comment:  rarely    FAMILY HISTORY:   Family History  Problem Relation Age of Onset  . Cancer Niece     DRUG ALLERGIES:   Allergies  Allergen Reactions  . Eliquis [Apixaban]     Dizziness  and vision change  . Spironolactone Other (See Comments)    dizziness    REVIEW OF SYSTEMS:   ROS As per history of present illness. All pertinent systems were reviewed above. Constitutional, HEENT, cardiovascular, respiratory, GI, GU, musculoskeletal, neuro, psychiatric, endocrine, integumentary and hematologic systems were reviewed and are otherwise negative/unremarkable except for positive findings mentioned above in the HPI.   MEDICATIONS AT HOME:   Prior to Admission medications   Medication Sig Start Date End Date Taking? Authorizing Provider  aspirin EC 81 MG tablet Take 1 tablet (81 mg total) by mouth daily. Swallow whole. 11/07/22   Sunnie Nielsen, DO  furosemide (LASIX) 40 MG tablet Take 40 mg  by mouth daily. 04/30/22   [provider]  furosemide (LASIX) 40 MG tablet Take 1 tablet (40 mg total) by mouth 2 (two) times daily for 5 days. 08/03/23 08/08/23  Merwyn Katos, MD  gentamicin cream (GARAMYCIN) 0.1 % Apply 1 Application topically 2 (two) times daily. 05/31/23   Felecia Shelling, [name]  glipiZIDE (GLUCOTROL) 10 MG tablet Take 10 mg by mouth 2 (two) times daily. 06/20/18   [provider]  metFORMIN (GLUCOPHAGE) 1000 MG tablet Take 1,000 mg by mouth 2 (two) times daily. 06/17/18   [provider]  metolazone (ZAROXOLYN) 2.5 MG tablet Take 2.5 mg by mouth once a week. On Mondays    [provider]  metoprolol succinate (TOPROL-XL) 50 MG 24 hr tablet Take 1 tablet (50 mg total) by mouth daily. Take with or immediately following a meal. 07/08/23   Loyce Dys, MD  Multiple Vitamins-Minerals (MULTIVITAMIN WITH MINERALS) tablet Take 1 tablet by mouth daily.    [provider]  OVER THE COUNTER MEDICATION 1 Scoop daily. Super Beets Powder    [provider]  silver sulfADIAZINE (SILVADENE) 1 % cream Apply to affected area daily 06/25/23 06/24/24  Felecia Shelling, [name]      VITAL SIGNS:  Blood pressure 138/83, pulse 95, temperature 97.9 F (36.6 C), temperature source Oral, resp. rate (!) 31, height 6\' 6"  (1.981 m), weight 113.4 kg, SpO2 95%.  PHYSICAL EXAMINATION:  Physical Exam  GENERAL:  77 y.o.-year-old Caucasian male patient lying in the bed with no acute distress.  EYES: Pupils equal, round, reactive to light and accommodation. No scleral icterus. Extraocular muscles intact.  HEENT: Head atraumatic, normocephalic. Oropharynx and nasopharynx clear.  NECK:  Supple, no jugular venous distention. No thyroid enlargement, no tenderness.  LUNGS: Diminished bibasal breath sounds with bibasal rales.  No use of accessory muscles of respiration.  CARDIOVASCULAR: Regular rate and rhythm, S1, S2 normal. No murmurs, rubs, or gallops.  ABDOMEN: Soft, nondistended, nontender. Bowel sounds present. No organomegaly or mass.  EXTREMITIES: 1+ bilateral lower extremity pitting edema with no cyanosis, or clubbing.  NEUROLOGIC: Cranial nerves II through XII are intact. Muscle strength 5/5 in all extremities. Sensation intact. Gait not checked.  PSYCHIATRIC: The patient is alert and oriented x 3.  Normal affect and good eye contact. SKIN: No obvious rash, lesion, or ulcer.   LABORATORY PANEL:   CBC Recent Labs  Lab 08/13/23 1554  WBC 8.9  HGB 10.4*  HCT 32.8*  PLT 241   ------------------------------------------------------------------------------------------------------------------  Chemistries  Recent Labs  Lab 08/13/23 1554  NA 133*  K 3.9  CL 103  CO2 20*  GLUCOSE 297*  BUN 49*  CREATININE 1.57*  CALCIUM 8.5*   ------------------------------------------------------------------------------------------------------------------  Cardiac Enzymes No results for input(s): "TROPONINI" in the last 168  hours. ------------------------------------------------------------------------------------------------------------------  RADIOLOGY:  DG Chest 2 View  Result Date: 08/13/2023 CLINICAL DATA:  Shortness of breath for several weeks, initial encounter EXAM: CHEST - 2 VIEW COMPARISON:  08/03/2023 FINDINGS: Cardiac shadow is enlarged but stable. Pacing device is again seen. Mild central vascular congestion is noted without significant edema. Chronic blunting of the costophrenic angles is seen. No focal infiltrate is noted. No bony abnormality is seen. IMPRESSION: Stable vascular congestion when compare with the prior exam. No focal infiltrate is noted. Electronically Signed   By: Alcide Clever M.D.   On: 08/13/2023 20:32      IMPRESSION AND PLAN:  Assessment and Plan: * Acute on chronic systolic (congestive)  heart failure [facility])  -The patient will be admitted to a cardiac telemetry bed. - We will continue diuresis with IV Lasix. - We Will follow serial troponins. - We will follow I's and O's and daily weights. - Cardiology consult be obtained. - I notified Dr. Darrold Junker about the patient.   Type 2 diabetes mellitus with chronic kidney disease, without long-term current use of insulin (HCC) - The patient will be placed on supplemental coverage with NovoLog. - We will continue Glucotrol.  Essential hypertension - Continue antihypertensive therapy.   DVT prophylaxis: Lovenox.  Advanced Care Planning:  Code Status: full code.  Family Communication:  The plan of care was discussed in details with the patient (and family). I answered all questions. The patient agreed to proceed with the above mentioned plan. Further management will depend upon hospital course. Disposition Plan: Back to previous home environment Consults called: Cardiology All the records are reviewed and case discussed with ED provider.  Status is: Inpatient   At the time of the admission, it appears that the appropriate  admission status for this patient is inpatient.  This is judged to be reasonable and necessary in order to provide the required intensity of service to ensure the patient's safety given the presenting symptoms, physical exam findings and initial radiographic and laboratory data in the context of comorbid conditions.  The patient requires inpatient status due to high intensity of service, high risk of further deterioration and high frequency of surveillance required.  I certify that at the time of admission, it is my clinical judgment that the patient will require inpatient hospital care extending more than 2 midnights.                            Dispo: The patient is from: Home              Anticipated d/c is to: Home              Patient currently is not medically stable to d/c.              Difficult to place patient: No  Hannah Beat M.D on 08/14/2023 at 2:20 AM  [facility]   From 7 PM-7 AM, contact night-coverage [email or web address]  CC: Primary care physician; Elder Negus, NP

## 2023-08-14 ENCOUNTER — Encounter: Payer: Self-pay | Admitting: Family Medicine

## 2023-08-14 ENCOUNTER — Telehealth (HOSPITAL_COMMUNITY): Payer: Self-pay | Admitting: Pharmacy Technician

## 2023-08-14 ENCOUNTER — Other Ambulatory Visit (HOSPITAL_COMMUNITY): Payer: Self-pay

## 2023-08-14 DIAGNOSIS — I5023 Acute on chronic systolic (congestive) heart failure: Secondary | ICD-10-CM | POA: Diagnosis not present

## 2023-08-14 DIAGNOSIS — E1122 Type 2 diabetes mellitus with diabetic chronic kidney disease: Secondary | ICD-10-CM

## 2023-08-14 LAB — BASIC METABOLIC PANEL
Anion gap: 8 (ref 5–15)
BUN: 48 mg/dL — ABNORMAL HIGH (ref 8–23)
CO2: 26 mmol/L (ref 22–32)
Calcium: 8.9 mg/dL (ref 8.9–10.3)
Chloride: 108 mmol/L (ref 98–111)
Creatinine, Ser: 1.61 mg/dL — ABNORMAL HIGH (ref 0.61–1.24)
GFR, Estimated: 43 mL/min — ABNORMAL LOW (ref >60)
Glucose, Bld: 234 mg/dL — ABNORMAL HIGH (ref 70–99)
Potassium: 4.1 mmol/L (ref 3.5–5.1)
Sodium: 139 mmol/L (ref 135–145)

## 2023-08-14 LAB — CBC
HCT: 30.8 % — ABNORMAL LOW (ref 39.0–52.0)
Hemoglobin: 9.7 g/dL — ABNORMAL LOW (ref 13.0–17.0)
MCH: 28.3 pg (ref 26.0–34.0)
MCHC: 31.5 g/dL (ref 30.0–36.0)
MCV: 89.8 fL (ref 80.0–100.0)
Platelets: 220 10*3/uL (ref 150–400)
RBC: 3.43 MIL/uL — ABNORMAL LOW (ref 4.22–5.81)
RDW: 15.2 % (ref 11.5–15.5)
WBC: 8.4 10*3/uL (ref 4.0–10.5)
nRBC: 0 % (ref 0.0–0.2)

## 2023-08-14 LAB — TROPONIN I (HIGH SENSITIVITY): Troponin I (High Sensitivity): 93 ng/L — ABNORMAL HIGH (ref <18)

## 2023-08-14 LAB — GLUCOSE, CAPILLARY
Glucose-Capillary: 204 mg/dL — ABNORMAL HIGH (ref 70–99)
Glucose-Capillary: 145 mg/dL — ABNORMAL HIGH (ref 70–99)

## 2023-08-14 LAB — BRAIN NATRIURETIC PEPTIDE: B Natriuretic Peptide: 1904.9 pg/mL — ABNORMAL HIGH (ref 0.0–100.0)

## 2023-08-14 MED ORDER — METOLAZONE 2.5 MG PO TABS: 2.5000 mg | ORAL_TABLET | ORAL | Status: DC

## 2023-08-14 MED ORDER — ACETAMINOPHEN 325 MG PO TABS
650.0000 mg | ORAL_TABLET | Freq: Four times a day (QID) | ORAL | Status: DC | PRN
  Administered 2023-08-14: 650 mg via ORAL
  Filled 2023-08-14: qty 2

## 2023-08-14 MED ORDER — FUROSEMIDE 40 MG PO TABS: 40.0000 mg | ORAL_TABLET | Freq: Every day | ORAL | Status: DC

## 2023-08-14 MED ORDER — GLIPIZIDE 10 MG PO TABS
10.0000 mg | ORAL_TABLET | Freq: Two times a day (BID) | ORAL | Status: DC
  Administered 2023-08-14: 10 mg via ORAL
  Filled 2023-08-14 (×2): qty 1

## 2023-08-14 MED ORDER — ADULT MULTIVITAMIN W/MINERALS CH
1.0000 | ORAL_TABLET | Freq: Every day | ORAL | Status: DC
  Administered 2023-08-14 – 2023-08-18 (×5): 1 via ORAL
  Filled 2023-08-14 (×5): qty 1

## 2023-08-14 MED ORDER — FUROSEMIDE 10 MG/ML IJ SOLN
60.0000 mg | Freq: Two times a day (BID) | INTRAMUSCULAR | Status: DC
  Administered 2023-08-14 – 2023-08-16 (×5): 60 mg via INTRAVENOUS
  Filled 2023-08-14 (×2): qty 6
  Filled 2023-08-14: qty 8
  Filled 2023-08-14 (×2): qty 6

## 2023-08-14 MED ORDER — INSULIN ASPART 100 UNIT/ML IJ SOLN
0.0000 [IU] | Freq: Three times a day (TID) | INTRAMUSCULAR | Status: DC
  Administered 2023-08-15: 5 [IU] via SUBCUTANEOUS
  Administered 2023-08-15: 2 [IU] via SUBCUTANEOUS
  Administered 2023-08-15: 1 [IU] via SUBCUTANEOUS
  Administered 2023-08-16: 2 [IU] via SUBCUTANEOUS
  Administered 2023-08-16: 7 [IU] via SUBCUTANEOUS
  Administered 2023-08-16: 2 [IU] via SUBCUTANEOUS
  Administered 2023-08-17: 5 [IU] via SUBCUTANEOUS
  Administered 2023-08-17: 3 [IU] via SUBCUTANEOUS
  Administered 2023-08-17: 1 [IU] via SUBCUTANEOUS
  Administered 2023-08-18: 3 [IU] via SUBCUTANEOUS
  Filled 2023-08-14 (×10): qty 1

## 2023-08-14 MED ORDER — METOPROLOL SUCCINATE ER 50 MG PO TB24
50.0000 mg | ORAL_TABLET | Freq: Every day | ORAL | Status: DC
  Administered 2023-08-14 – 2023-08-18 (×5): 50 mg via ORAL
  Filled 2023-08-14 (×5): qty 1

## 2023-08-14 MED ORDER — ASPIRIN 81 MG PO TBEC
81.0000 mg | DELAYED_RELEASE_TABLET | Freq: Every day | ORAL | Status: DC
  Administered 2023-08-14 – 2023-08-18 (×5): 81 mg via ORAL
  Filled 2023-08-14 (×5): qty 1

## 2023-08-14 MED ORDER — MAGNESIUM HYDROXIDE 400 MG/5ML PO SUSP
30.0000 mL | Freq: Every day | ORAL | Status: DC | PRN
Start: 2023-08-14 — End: 2023-08-18

## 2023-08-14 MED ORDER — ONDANSETRON HCL 4 MG/2ML IJ SOLN: 4.0000 mg | Freq: Four times a day (QID) | INTRAMUSCULAR | Status: DC | PRN

## 2023-08-14 MED ORDER — ACETAMINOPHEN 650 MG RE SUPP: 650.0000 mg | Freq: Four times a day (QID) | RECTAL | Status: DC | PRN

## 2023-08-14 MED ORDER — ACETAMINOPHEN 500 MG PO TABS
1000.0000 mg | ORAL_TABLET | Freq: Three times a day (TID) | ORAL | Status: DC | PRN
  Administered 2023-08-14 – 2023-08-18 (×9): 1000 mg via ORAL
  Filled 2023-08-14 (×10): qty 2

## 2023-08-14 MED ORDER — INSULIN ASPART 100 UNIT/ML IJ SOLN
0.0000 [IU] | Freq: Every day | INTRAMUSCULAR | Status: DC
  Administered 2023-08-15 – 2023-08-16 (×2): 2 [IU] via SUBCUTANEOUS
  Filled 2023-08-14 (×3): qty 1

## 2023-08-14 MED ORDER — SILVER SULFADIAZINE 1 % EX CREA
TOPICAL_CREAM | Freq: Every day | CUTANEOUS | Status: DC
  Filled 2023-08-14: qty 85

## 2023-08-14 MED ORDER — LOSARTAN POTASSIUM 25 MG PO TABS
25.0000 mg | ORAL_TABLET | Freq: Every day | ORAL | Status: DC
  Administered 2023-08-14 – 2023-08-15 (×2): 25 mg via ORAL
  Filled 2023-08-14 (×2): qty 1

## 2023-08-14 MED ORDER — GENTAMICIN SULFATE 0.1 % EX CREA
1.0000 | TOPICAL_CREAM | Freq: Two times a day (BID) | CUTANEOUS | Status: DC
  Administered 2023-08-15 – 2023-08-18 (×6): 1 via TOPICAL
  Filled 2023-08-14: qty 15

## 2023-08-14 MED ORDER — ONDANSETRON HCL 4 MG PO TABS: 4.0000 mg | ORAL_TABLET | Freq: Four times a day (QID) | ORAL | Status: DC | PRN

## 2023-08-14 MED ORDER — CHLORHEXIDINE GLUCONATE CLOTH 2 % EX PADS
6.0000 | MEDICATED_PAD | Freq: Every day | CUTANEOUS | Status: DC
  Administered 2023-08-14 – 2023-08-18 (×5): 6 via TOPICAL

## 2023-08-14 MED ORDER — TRAZODONE HCL 50 MG PO TABS
25.0000 mg | ORAL_TABLET | Freq: Every evening | ORAL | Status: DC | PRN
  Administered 2023-08-15 – 2023-08-17 (×3): 25 mg via ORAL
  Filled 2023-08-14 (×3): qty 1

## 2023-08-14 MED ORDER — ENOXAPARIN SODIUM 40 MG/0.4ML IJ SOSY
40.0000 mg | PREFILLED_SYRINGE | INTRAMUSCULAR | Status: DC
  Administered 2023-08-14 – 2023-08-18 (×5): 40 mg via SUBCUTANEOUS
  Filled 2023-08-14 (×5): qty 0.4

## 2023-08-14 NOTE — ED Notes (Signed)
Informed RN bed assigned 

## 2023-08-14 NOTE — Assessment & Plan Note (Signed)
-   The patient will be placed on supplemental coverage with NovoLog. - We will continue Glucotrol.

## 2023-08-14 NOTE — Assessment & Plan Note (Signed)
-  The patient will be admitted to a cardiac telemetry bed. - We will continue diuresis with IV Lasix. - We Will follow serial troponins. - We will follow I's and O's and daily weights. - Cardiology consult be obtained. - I notified Dr. Saralyn Pilar about the patient.

## 2023-08-14 NOTE — ED Notes (Signed)
Report to [place], rn

## 2023-08-14 NOTE — Telephone Encounter (Signed)
Patient Product/process development scientist completed.    The patient is insured through [facility]. Patient has [employer], may use a copay card, and/or apply for patient assistance if available.    Ran test claim for Entresto 24-26 mg and the current 30 day co-pay is $0.00.  Ran test claim for Farxiga 10 mg and the current 30 day co-pay is $0.00.  Ran test claim for Jardiance 10 mg and the current 30 day co-pay is $0.00.  This test claim was processed through [facility]- copay amounts may vary at other pharmacies due to pharmacy/plan contracts, or as the patient moves through the different stages of their insurance plan.     Roland Earl, CPHT Pharmacy Technician III Certified Patient Advocate [facility] Pharm[facility] Patient Advocate Team Direct Number: [phone]  Fax: [phone]

## 2023-08-14 NOTE — ED Notes (Signed)
Pt arrived to room, awaiting orders including diet order from admission team. Pt updated. Pt verbalizes understanding. Pt with noted dyspnea on exertion, orthopnea. Pt with cough noted, productive. Pt has own foley cath that he manages.

## 2023-08-14 NOTE — Progress Notes (Signed)
Heart Failure Stewardship Pharmacy Note  PCP: Elder Negus, NP PCP-Cardiologist: None  HPI: Tyler [name] is a 77 y.o. male with HTN, HLD, DM, PVD, SSS (s/p PPM), dCHF, CKD-3A, obesity, rheumatic fever, atrial fibrillation not on anticoagulants, chronic diabetic foot ulcers, s/p third metatarsal head resection with bone biopsy right foot who presented with worsening dyspnea on exertion, PND, and orthopnea. Troponin on admission was 112. BNP on admission was elevated at 1904.9. CXR on admission showed stable vascular congestion.    Pertinent cardiac history: Long term holter in 03/2020 showed 6.1% PVC burden and multiple AF episodes. Ablation performed and pacemaker placed in 08/2020. Stress test 03/2023 showed no evidence of ischemia and LVEF of 57%. TTE in 08/2020 and 09/2020 showed LVEF of 55-60%. Echo 07/03/23 showed LVEF of 30-35% and moderate MR.    Pertinent Lab Values: Creatinine  Date Value Ref Range Status  07/10/2014 1.26 0.60 - 1.30 mg/dL Final   Creatinine, Ser  Date Value Ref Range Status  08/14/2023 1.61 (H) 0.61 - 1.24 mg/dL Final   BUN  Date Value Ref Range Status  08/14/2023 48 (H) 8 - 23 mg/dL Final  16/06/9603 10 7 - 18 mg/dL Final   Potassium  Date Value Ref Range Status  08/14/2023 4.1 3.5 - 5.1 mmol/L Final  07/10/2014 3.7 3.5 - 5.1 mmol/L Final   Sodium  Date Value Ref Range Status  08/14/2023 139 135 - 145 mmol/L Final  07/10/2014 142 136 - 145 mmol/L Final   B Natriuretic Peptide  Date Value Ref Range Status  08/13/2023 1,904.9 (H) 0.0 - 100.0 pg/mL Final    Comment:    Performed at [facility], [address] [place], [place] [address]   Magnesium  Date Value Ref Range Status  07/03/2023 2.4 1.7 - 2.4 mg/dL Final    Comment:    Performed at [facility], [address] [place], [place] [address]  07/10/2014 1.8 mg/dL Final    Comment:    7.8-2.9 THERAPEUTIC RANGE: 4-7 mg/dL TOXIC: > 10 mg/dL   -----------------------    Hemoglobin A1C  Date Value Ref Range Status  07/05/2014 10.4 (H) 4.2 - 6.3 % Final    Comment:    The American Diabetes Association recommends that a primary goal of therapy should be <7% and that physicians should reevaluate the treatment regimen in patients with HbA1c values consistently >8%. Hemoglobin A1c - ** THIS IS A CORRECTED REPORT **  - PLEASE DISREGARD PREVIOUS RESULT.  - Corrected report called to and read back  - by Crista Elliot on 1C @ 1155 07/06/14.  - BGB    Hgb A1c MFr Bld  Date Value Ref Range Status  07/02/2023 7.2 (H) 4.8 - 5.6 % Final    Comment:    (NOTE) Pre diabetes:          5.7%-6.4%  Diabetes:              >6.4%  Glycemic control for   <7.0% adults with diabetes    TSH  Date Value Ref Range Status  06/28/2020 1.726 0.350 - 4.500 uIU/mL Final    Comment:    Performed by a 3rd Generation assay with a functional sensitivity of <=0.01 uIU/mL. Performed at [facility], [address] [place], [place] [address]     Vital Signs: Admission weight: Temp:  [97.5 F (36.4 C)-97.9 F (36.6 C)] 97.9 F (36.6 C) (11/20 0745) Pulse Rate:  [87-103] 87 (11/20 0745) Cardiac Rhythm: A-V Sequential  paced (11/20 0745) Resp:  [15-31] 20 (11/20 0745) BP: (131-153)/(75-92) 131/75 (11/20 0745) SpO2:  [94 %-100 %] 100 % (11/20 0745) Weight:  [113.4 kg (250 lb)] 113.4 kg (250 lb) (11/19 1550)  Intake/Output Summary (Last 24 hours) at 08/14/2023 1349 Last data filed at 08/13/2023 2208 Gross per 24 hour  Intake --  Output 500 ml  Net -500 ml    Current Heart Failure Medications:  Loop diuretic: furosemide 60 mg IV BID Beta-Blocker: metoprolol succinate 50 mg daily ACEI/ARB/ARNI: losartan 25 mg daily MRA: SGLT2i: Other:  Prior to admission Heart Failure Medications:  Loop diuretic: furosemide 40 mg daily Beta-Blocker: metoprolol succinate 50 mg daily ACEI/ARB/ARNI: none MRA: none SGLT2i: none Other:  metolazone 2.5 mg once weekly  Assessment: 1. 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 NYHA class III symptoms.  -Symptoms: Reports shortness of breath with minimal exertion. LEE is present but close to baseline.   -Volume: Appears hypervolemic on exam. Creatinine stable with diuresis. Not much documented urine output. CO2 on admission low, but lacked low output symptoms and is now resolved.  -Hemodynamics: BP 130-150s/60s with HR of 87.  -BB: Previously did not tolerate carvedilol due to blurred vision, now on metoprolol succinate 50 mg daily. Would not titrate while hypervolemic.. -ACE/ARB/ARNI: BP is elevated and patient would benefit from -MRA: Renal function is stable can consider starting eplerenone. Previously did not tolerate spironolactone due to dizziness. Would actually be a better candidate for finerenone given CKD and DM.  -SGLT2i: Not a candidate given chronic foley catheter.  -Given AF and CHADSVASc >3, may benefit from chronic anticoagulation, however with history of bleeding, patient insisted staying off meds. Given history of rheumatic fever, if valvular disease is present, would require warfarin per INVICTUS trial.  Plan: 1) Medication changes recommended at this time: -None. Agree with starting losartan. If BP remains stable on losartan tomorrow, can consider transitioning to [facility].  -Recommend starting Kerendia on discharge to help HF and CKD  2) Patient assistance: -Sherryll Burger and [name] are $0  3) Education: - Patient has been educated on current HF medications and potential additions to HF medication regimen - Patient verbalizes understanding that over the next few months, these medication doses may change and more medications may be added to optimize HF regimen - Patient has been educated on basic disease state pathophysiology and goals of therapy  Medication Assistance / Insurance Benefits Check: Does the patient have prescription insurance?  Prescription Insurance: Commercial  Type of insurance plan:    Outpatient Pharm[facility]     Please do not hesitate to reach out with questions or concerns,  Enos Fling, PharmD, CPP, BCPS Heart Failure Pharmacist  Phone - [phone] 08/14/2023 1:49 PM

## 2023-08-14 NOTE — Telephone Encounter (Signed)
Patient Product/process development scientist completed.    The patient is insured through [facility]. Patient has [employer], may use a copay card, and/or apply for patient assistance if available.    Ran test claim for Entresto 24-26 mg and the current 30 day co-pay is $0.00.  Ran test claim for Kerendia 20 mg and the current 30 day co-pay is $0.00.  This test claim was processed through [facility]- copay amounts may vary at other pharmacies due to pharmacy/plan contracts, or as the patient moves through the different stages of their insurance plan.     Roland Earl, CPHT Pharmacy Technician III Certified Patient Advocate [facility] Pharmacy Patient Advocate Team Direct Number: [phone]  Fax: [phone]

## 2023-08-14 NOTE — Progress Notes (Signed)
  PROGRESS NOTE    Tyler [name]  NGE:[id number] DOB: 1945-11-30 DOA: 08/13/2023 PCP: Elder Negus, NP  [id number]  LOS: 1 day   Brief hospital course:   Assessment & Plan: Tyler [name] is a 77 y.o. Caucasian male with medical history significant for HFrEF, stage III CKD, CAD, osteoarthritis, hypertension, dyslipidemia and rheumatic fever, as well as type 2 diabetes mellitus, who presented to the emergency room with acute onset of worsening dyspnea with associated orthopnea and paroxysmal nocturnal dyspnea over the last few days.    * Acute on chronic systolic (congestive) heart failure (HCC) --dyspnea, BLE swelling, BNP 1904, CXR showed vascular congestion. --pt started on IV lasix 60 --cardio consulted --Patient did not tolerate spironolactone outpatient. Not candidate for SGLT2i given chronic foley.  Plan: --cont IV lasix 60 BID --cont metolazone --start losartan 25 mg daily --cont Toprol 50 mg daily  Trop elevation 2/2 demand ischemia --trop 100's flat. 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 Underwent PPM implant in 2021 for CHB, has had regular device checks since implant.  -Recommend anticoagulation however patient has historically deferred given prior bleeding issues. --cont Toprol  # CKD 3a --monitor Cr while diuresing   Type 2 diabetes mellitus with chronic kidney disease, without long-term current use of insulin (HCC) --recent A1c 7.2 --hold home glipizide --ACHS and SSI  LE cellulitis ruled out   DVT prophylaxis: Lovenox SQ Code Status: Full code  Family Communication:  Level of care: Telemetry Cardiac Dispo:   The patient is from: home Anticipated d/c is to: home Anticipated d/c date is: 1-2 days   Subjective and Interval History:   Pt reported breathing improved.  Good urine output.   Objective: Vitals:   08/14/23 0745 08/14/23 1500 08/14/23 1600 08/14/23 1617  BP: 131/75  123/70 107/62  Pulse: 87 71 74 73  Resp: 20  (!) 22   Temp: 97.9 F (36.6 C)  98.2 F (36.8 C) 98.5 F (36.9 C)  TempSrc: Oral  Oral Oral  SpO2: 100% 100% 100% 98%  Weight:      Height:        Intake/Output Summary (Last 24 hours) at 08/14/2023 1800 Last data filed at 08/13/2023 2208 Gross per 24 hour  Intake --  Output 500 ml  Net -500 ml   Filed Weights   08/13/23 1550  Weight: 113.4 kg    Examination:   Constitutional: NAD, AAOx3 HEENT: conjunctivae and lids normal, EOMI CV: No cyanosis.   RESP: normal respiratory effort, on 2L 97% Extremities: tense edema in BLE, bilateral foot wounds SKIN: warm, dry Neuro: II - XII grossly intact.     Data Reviewed: I have personally reviewed labs and imaging studies  Time spent: 50 minutes  Darlin Priestly, MD [facility] If 7PM-7AM, please contact night-coverage 08/14/2023, 6:00 PM

## 2023-08-14 NOTE — Consult Note (Signed)
[facility] CLINIC CARDIOLOGY CONSULT NOTE       Patient ID: Tyler [name] MRN: [id number] DOB/AGE: 06-02-1946 77 y.o.  Admit date: 08/13/2023 Referring Physician Dr. Valente David Primary Physician Elder Negus, NP  Primary Cardiologist Dr. Corky Sing (no show appt 10/23) Reason for Consultation AoCHF  HPI: Tyler [name] is a 77 y.o. male  with a past medical history of chronic HFpEF, persistent atrial fibrillation, mitral insufficiency, CHB s/p PPM 2021, hypertension, chronic kidney disease stage III who presented to the ED on 08/13/2023 for shortness of breath. Cardiology was consulted for further evaluation.   Patient reports that over the last few days he has been experiencing worsening SOB. States that this is present both at rest and with activity but he has noticed this becoming significantly more bothersome on exertion. States he also felt SOB when laying down. Denies any LE edema worse than his usual, has chronic cellulitis of both LE. Given his worsening SOB he decided to come to the ED for further evaluation. Workup in the ED notable for Cr 1.57 (near baseline), K 3.9, Hgb 10.4, WBC 8.9. BNP elevated at 1900. Troponins mildly elevated and flat trending 112 > 93. CXR with vascular congestion. EKG with ventricular paced rhythm. He was started on IV lasix and supplemental O2 in the ED.   At the time of my evaluation this AM the patient is resting comfortably upright in hospital bed. States his SOB is mildly improved this AM. Remains on 3 L [place]. Discussed his recent symptoms in detail. He states that he has been seen by Clarisa Kindred with HF clinic, he did not show for his appointment at [facility] cardiology with Dr. Corky Sing. Reports he has only been taking metoprolol and lasix for heart failure. Review of HF clinic notes shows that he did not tolerate spironolactone due to dizziness and declined further additions/titrations to meds. Denies any recent episodes of chest pain, palpitaitons, dizziness. Main  concern is worsening SOB. Also endorses L leg pain related to his chronic wounds.   Review of systems complete and found to be negative unless listed above    Past Medical History:  Diagnosis Date   (HFpEF) heart failure with preserved ejection fraction (HCC) 03/01/2020   a.) TTE 03/01/2020: EF 55-60%, mod MAC, mod AoV sclerosis, triv AR, mild TR, mod MR, RVSP 50-59; b.) TTE 09/10/2020: EF 55-60%, mod MAC, mod AoV sclerosis, mild TR, 3+ MR, RVSP 50-59; c.) TTE 09/26/2020: EF 55-60%, mild LA dil, triv PR, mild MR/TR, RVSP 37-49   Adenoma of left adrenal gland    Anemia    Arthritis    Atrial fibrillation and flutter (HCC)    a.) CHA2DS2VASc = 5 (age x2, HFpEF, HTN, T2DM);  b.) s/p CTI ablation 09/07/2020; c.) rate/rhythm maintained on oral carvedilol; not on chronic anticoagulation therapy   CAD (coronary artery disease)    Cardiomegaly    CKD (chronic kidney disease), stage III (HCC)    DOE (dyspnea on exertion)    Drug-induced bradycardia    Gangrene of toe of left foot (HCC)    a.) 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    HLD (hyperlipidemia)    Hypertension    Long term current use of aspirin    Lymphedema of both lower extremities    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)    a.) s/p amputation 11/04/2022   Peripheral vascular disease (HCC)    Pleural  effusion on right 09/09/2020   a.) s/p RIGHT thoracentesis with 2180 cc yield   Pneumonia    Presence of permanent cardiac pacemaker 09/10/2020   a.) TVP placement 09/10/2020 due to intermittent CHB in setting of urosepsis; b.) s/p PPM placement 09/15/2020: MDT Azure XT DR (SN: [id number])   Pulmonary hypertension (HCC) 03/01/2020   a.) TTE: 03/01/2020: RVSP 50-59; b.) TTE 09/10/2020: RVSP 50-59; c.) TTE 09/26/2020: RVSP 37-49   RA (rheumatoid arthritis) (HCC)    Rheumatic fever    Sepsis (HCC) 09/10/2020   a.) urosepsis --> BC x 2 sets and UC all  grew out significant Proteus mirabilis; admitted to [facility] 09/07/2020 - 09/27/2020.   Sick sinus syndrome [facility])    a.) s/p MDT PPM placement 09/15/2020   T2DM (type 2 diabetes mellitus) (HCC)    Urinary retention    chronic, with indwelling Foley catheter and plans for a suprapubic   Wears dentures    full upper    Past Surgical History:  Procedure Laterality Date   AMPUTATION TOE Right 11/04/2022   Procedure: AMPUTATION TOE;  Surgeon: Felecia Shelling, [name];  [name];  Service: Podiatry;  Laterality: Right;   BONE BIOPSY Right 04/05/2023   Procedure: BONE BIOPSY THIRD & FOURTH;  Surgeon: Felecia Shelling, [name];  [name];  Service: Podiatry;  Laterality: Right;   CARDIAC ELECTROPHYSIOLOGY STUDY AND ABLATION N/A 09/07/2020   Procedure: CARDIAC EP STUDY AND ABLATION (CTI)   CATARACT EXTRACTION W/PHACO Right 01/16/2017   Procedure: CATARACT EXTRACTION PHACO AND INTRAOCULAR LENS PLACEMENT (IOC)  Right Complicated;  Surgeon: Lockie Mola, MD;  Location: [facility];  Service: Ophthalmology;  Laterality: Right;  IVA Block Healon 5 malyugin vision blue Diabetic - oral meds   CATARACT EXTRACTION W/PHACO Left 10/23/2022   Procedure: CATARACT EXTRACTION PHACO AND INTRAOCULAR LENS PLACEMENT (IOC) LEFT DIABETIC;  Surgeon: Galen Manila, MD;  Location: [facility];  Service: Ophthalmology;  Laterality: Left;  Diabetic   COLONOSCOPY     LOWER EXTREMITY ANGIOGRAPHY Right 11/02/2022   Procedure: Lower Extremity Angiography;  Surgeon: Annice Needy, MD;  Location: [facility] INVASIVE CV LAB;  Service: Cardiovascular;  Laterality: Right;   METATARSAL HEAD EXCISION Left 07/05/2018   Procedure: RESECTION FIRST METATARSAL INFECTED BONE AND SOFT TISSUE;  Surgeon: Recardo Evangelist, [name];  [name];  Service: Podiatry;  Laterality: Left;   METATARSAL HEAD EXCISION Right 04/05/2023   Procedure: METATARSAL HEAD EXCISION THIRD & FOURTH;  Surgeon: Felecia Shelling, [name];  [name];  Service: Podiatry;  Laterality: Right;   PACEMAKER INSERTION  09/15/2020   TOE AMPUTATION Left    TONSILLECTOMY      (Not in a hospital admission)  Social History   Socioeconomic History   Marital status: Single    Spouse name: Not on file   Number of children: Not on file   Years of education: Not on file   Highest education level: Not on file  Occupational History   Not on file  Tobacco Use   Smoking status: Never   Smokeless tobacco: Never  Vaping Use   Vaping status: Never Used  Substance and Sexual Activity   Alcohol use: Not Currently    Comment: rarely   Drug use: Never   Sexual activity: Not Currently    Birth control/protection: None  Other Topics Concern   Not on file  Social History Narrative   Lives with roommate   Social Determinants of Health   Financial  Resource Strain: Low Risk  (03/20/2023)   Received from [facility], [facility]   Overall Financial Resource Strain (CARDIA)    Difficulty of Paying Living Expenses: Not hard at all  Food Insecurity: No Food Insecurity (08/14/2023)   Hunger Vital Sign    Worried About Running Out of Food in the Last Year: Never true    Ran Out of Food in the Last Year: Never true  Transportation Needs: No Transportation Needs (08/14/2023)   PRAPARE - Administrator, Civil Service (Medical): No    Lack of Transportation (Non-Medical): No  Physical Activity: Not on file  Stress: No Stress Concern Present (09/07/2020)   Received from [facility], [facility] of Occupational Health - Occupational Stress Questionnaire    Feeling of Stress : Not at all  Social Connections: Unknown (02/01/2022)   Received from [facility], [facility]   Social Network    Social Network: Not on file  Intimate Partner Violence: Not At Risk (08/14/2023)   Humiliation, Afraid, Rape, and Kick questionnaire    Fear of Current or Ex-Partner: No    Emotionally  Abused: No    Physically Abused: No    Sexually Abused: No    Family History  Problem Relation Age of Onset   Cancer Niece      Vitals:   08/13/23 2330 08/14/23 0207 08/14/23 0500 08/14/23 0626  BP: 138/83  133/76   Pulse: 95  93   Resp: (!) 31  (!) 29   Temp:  97.9 F (36.6 C)  97.8 F (36.6 C)  TempSrc:  Oral  Axillary  SpO2: 95%  100%   Weight:      Height:        PHYSICAL EXAM General: Chronically-ill appearing, well nourished, in no acute distress. HEENT: Normocephalic and atraumatic. Neck: No JVD.  Lungs: Normal respiratory effort on 3L. Bibasilar crackles.  Heart: HRRR. Normal S1 and S2 without gallops or murmurs.  Abdomen: Non-distended appearing.  Msk: Normal strength and tone for age. Extremities: Warm and well perfused. No clubbing, cyanosis. Chronic cellulitis bilaterally with multiple ulcerations and wounds.  Neuro: Alert and oriented X 3. Psych: Answers questions appropriately.   Labs: Basic Metabolic Panel: Recent Labs    08/13/23 1554 08/14/23 0638  NA 133* 139  K 3.9 4.1  CL 103 108  CO2 20* 26  GLUCOSE 297* 234*  BUN 49* 48*  CREATININE 1.57* 1.61*  CALCIUM 8.5* 8.9   Liver Function Tests: No results for input(s): "AST", "ALT", "ALKPHOS", "BILITOT", "PROT", "ALBUMIN" in the last 72 hours. No results for input(s): "LIPASE", "AMYLASE" in the last 72 hours. CBC: Recent Labs    08/13/23 1554 08/14/23 0638  WBC 8.9 8.4  HGB 10.4* 9.7*  HCT 32.8* 30.8*  MCV 90.1 89.8  PLT 241 220   Cardiac Enzymes: Recent Labs    08/13/23 1538 08/14/23 0638  TROPONINIHS 112* 93*   BNP: Recent Labs    08/13/23 1554  BNP 1,904.9*   D-Dimer: No results for input(s): "DDIMER" in the last 72 hours. Hemoglobin A1C: No results for input(s): "HGBA1C" in the last 72 hours. Fasting Lipid Panel: No results for input(s): "CHOL", "HDL", "LDLCALC", "TRIG", "CHOLHDL", "LDLDIRECT" in the last 72 hours. Thyroid Function Tests: No results for input(s):  "TSH", "T4TOTAL", "T3FREE", "THYROIDAB" in the last 72 hours.  Invalid input(s): "FREET3" Anemia Panel: No results for input(s): "VITAMINB12", "FOLATE", "FERRITIN", "TIBC", "IRON", "RETICCTPCT" in the last 72 hours.  Radiology: DG Chest 2 View  Result Date: 08/13/2023 CLINICAL DATA:  Shortness of breath for several weeks, initial encounter EXAM: CHEST - 2 VIEW COMPARISON:  08/03/2023 FINDINGS: Cardiac shadow is enlarged but stable. Pacing device is again seen. Mild central vascular congestion is noted without significant edema. Chronic blunting of the costophrenic angles is seen. No focal infiltrate is noted. No bony abnormality is seen. IMPRESSION: Stable vascular congestion when compare with the prior exam. No focal infiltrate is noted. Electronically Signed   By: Alcide Clever M.D.   On: 08/13/2023 20:32   DG Chest 2 View  Result Date: 08/03/2023 CLINICAL DATA:  Shortness of breath. EXAM: CHEST - 2 VIEW COMPARISON:  Chest radiograph dated July 02, 2023. FINDINGS: Stable cardiomegaly. Stable left subclavian dual lead pacemaker. Aortic atherosclerosis. Central pulmonary vascular congestion with bilateral interstitial opacities. Small bilateral pleural effusions, more pronounced on the left. No pneumothorax. No acute osseous abnormality. IMPRESSION: 1. Cardiomegaly with findings suggestive of pulmonary vascular congestion and interstitial edema. 2. Small bilateral pleural effusions, left-greater-than-right. Electronically Signed   By: Hart Robinsons M.D.   On: 08/03/2023 20:27    ECHO 06/2023: 1. Left ventricular ejection fraction, by estimation, is 30 to 35%. The left ventricle has moderately decreased function. The left ventricle demonstrates regional wall motion abnormalities (see scoring diagram/findings for description). Indeterminate diastolic filling due to E-A fusion.   2. Right ventricular systolic function is normal. The right ventricular size is normal.   3. The mitral valve is  normal in structure. Moderate mitral valve regurgitation. No evidence of mitral stenosis.   4. The aortic valve is normal in structure. Aortic valve regurgitation is not visualized. No aortic stenosis is present.   5. The inferior vena cava is normal in size with greater than 50%  respiratory variability, suggesting right atrial pressure of 3 mmHg.   TELEMETRY reviewed by me 08/14/2023: ventricular pacing rate 80s  EKG reviewed by me: VP with PVCs rate 97 bpm  Data reviewed by me 08/14/2023: last 24h vitals tele labs imaging I/O ED provider note, admission H&P  Principal Problem:   Acute on chronic systolic (congestive) heart failure (HCC) Active Problems:   Essential hypertension   Type 2 diabetes mellitus with chronic kidney disease, without long-term current use of insulin (HCC)    ASSESSMENT AND PLAN:  Tyler [name] is a 77 y.o. male  with a past medical history of chronic HFpEF, persistent atrial fibrillation, mitral insufficiency, CHB s/p PPM 2021, hypertension, chronic kidney disease stage III who presented to the ED on 08/13/2023 for shortness of breath. Cardiology was consulted for further evaluation.   # Acute on chronic heart failure reduced EF # Acute hypoxic respiratory failure # LE cellulitis Patient presented with worsening SOB, stable LE edema. Worsening for a few days prior to presentation. +DOE and orthopnea. Unsure which medications he is taking at home, did not show for his appointment in clinic but has seen [facility]. BNP elevated at 1900. Requiring supplemental O2.  -Continue IV lasix 60 mg bid.  -Start losartan 25 mg daily. Continue metoprolol succinate 50 mg daily. Patient did not tolerate spironolactone outpatient. Not candidate for SGLT2i given chronic foley.  -Wean O2 as tolerated. -Mild and flat troponin elevation most consistent with demand/supply mismatch and not ACS in the setting of acute heart failure. -No plan for further cardiac diagnostics at this  time.   # CHB s/p Medtronic dual chamber PPM 08/2020 # Hx atrial fibrillation Patient with history of persistent atrial  fibrillation previously on eliquis, this was discontinued due to hematuria in 2023. Underwent PPM implant in 2021 for CHB, has had regular device checks since implant.  -Beta-blocker as above for rate control -Recommend anticoagulation however patient has historically deferred given prior bleeding issues.  # Chronic kidney disease stage IIIa Patient with hx of CKD, baseline Cr 1.3-1.4. Cr this AM 1.61 -Continue to monitor renal function closely during diuresis.   This patient's plan of care was discussed and created with Dr. Darrold Junker and he is in agreement.  Signed: Gale Journey, PA-C  08/14/2023, 7:39 AM [facility] Cardiology

## 2023-08-14 NOTE — Inpatient Diabetes Management (Signed)
Inpatient Diabetes Program Recommendations  AACE/ADA: New Consensus Statement on Inpatient Glycemic Control (2015)  Target Ranges:  Prepandial:   less than 140 mg/dL      Peak postprandial:   less than 180 mg/dL (1-2 hours)      Critically ill patients:  140 - 180 mg/dL   Lab Results  Component Value Date   GLUCAP 205 (H) 07/07/2023   HGBA1C 7.2 (H) 07/02/2023    Review of Glycemic Control  Diabetes history: DM2 Outpatient Diabetes medications: Glucotrol 10 mg bid, Metformin 1 gm bid Current orders for Inpatient glycemic control: glucotrol 10 mg bid  Inpatient Diabetes Program Recommendations:   Patient currently in ED. Please consider: -Glycemic control order set with 0-9 units tid, 0-5 units hs correction  Thank you, Darel Hong E. Canton Yearby, RN, MSN, CDCES  Diabetes Coordinator Inpatient Glycemic Control Team Team Pager [phone] (8am-5pm) 08/14/2023 12:13 PM

## 2023-08-14 NOTE — Progress Notes (Signed)
Education Assessment and Provision:  Detailed education and instructions provided on heart failure disease management including the following:  Signs and symptoms of Heart Failure When to call the physician Importance of daily weights- pt was not doing this everyday.  Encouraged him to start daily weights. Low sodium diet Fluid restriction Medication management Anticipated future follow-up appointments-Pt had a pre-existing appointment with Inetta Fermo on 08/26/23 @ 10:00 am.    Patient education given on each of the above topics.  Patient acknowledges understanding via teach back method and acceptance of all instructions.  Education Materials:  "Living Better With Heart Failure" Booklet, HF zone tool, & Daily Weight Tracker Tool.  Patient has scale at home: Yes Patient has pill box at home: Uses a cigar box to organize his meds   Navigator will sign off at this time.  Roxy Horseman, RN, BSN [facility] Heart Failure Navigator Secure Chat Only

## 2023-08-14 NOTE — Assessment & Plan Note (Signed)
-  Continue antihypertensive therapy ?

## 2023-08-15 DIAGNOSIS — I5023 Acute on chronic systolic (congestive) heart failure: Secondary | ICD-10-CM | POA: Diagnosis not present

## 2023-08-15 LAB — GLUCOSE, CAPILLARY
Glucose-Capillary: 187 mg/dL — ABNORMAL HIGH (ref 70–99)
Glucose-Capillary: 285 mg/dL — ABNORMAL HIGH (ref 70–99)
Glucose-Capillary: 146 mg/dL — ABNORMAL HIGH (ref 70–99)
Glucose-Capillary: 210 mg/dL — ABNORMAL HIGH (ref 70–99)

## 2023-08-15 LAB — BASIC METABOLIC PANEL
Anion gap: 11 (ref 5–15)
BUN: 48 mg/dL — ABNORMAL HIGH (ref 8–23)
CO2: 24 mmol/L (ref 22–32)
Calcium: 8.7 mg/dL — ABNORMAL LOW (ref 8.9–10.3)
Chloride: 103 mmol/L (ref 98–111)
Creatinine, Ser: 1.49 mg/dL — ABNORMAL HIGH (ref 0.61–1.24)
GFR, Estimated: 47 mL/min — ABNORMAL LOW (ref >60)
Glucose, Bld: 198 mg/dL — ABNORMAL HIGH (ref 70–99)
Potassium: 4 mmol/L (ref 3.5–5.1)
Sodium: 138 mmol/L (ref 135–145)

## 2023-08-15 LAB — CBC
HCT: 32.7 % — ABNORMAL LOW (ref 39.0–52.0)
Hemoglobin: 10.2 g/dL — ABNORMAL LOW (ref 13.0–17.0)
MCH: 28.3 pg (ref 26.0–34.0)
MCHC: 31.2 g/dL (ref 30.0–36.0)
MCV: 90.6 fL (ref 80.0–100.0)
Platelets: 224 10*3/uL (ref 150–400)
RBC: 3.61 MIL/uL — ABNORMAL LOW (ref 4.22–5.81)
RDW: 15.2 % (ref 11.5–15.5)
WBC: 8.1 10*3/uL (ref 4.0–10.5)
nRBC: 0 % (ref 0.0–0.2)

## 2023-08-15 LAB — MAGNESIUM: Magnesium: 2.2 mg/dL (ref 1.7–2.4)

## 2023-08-15 MED ORDER — SACUBITRIL-VALSARTAN 24-26 MG PO TABS
1.0000 | ORAL_TABLET | Freq: Two times a day (BID) | ORAL | Status: DC
  Administered 2023-08-16 – 2023-08-18 (×5): 1 via ORAL
  Filled 2023-08-15 (×5): qty 1

## 2023-08-15 NOTE — Progress Notes (Signed)
  PROGRESS NOTE    Tyler [name]  FAO:[id number] DOB: 07-06-1946 DOA: 08/13/2023 PCP: Elder Negus, NP  234A/[id number]-AA  LOS: 2 days   Brief hospital course:   Assessment & Plan: Tyler [name] is a 77 y.o. Caucasian male with medical history significant for HFrEF, stage III CKD, CAD, osteoarthritis, hypertension, dyslipidemia and rheumatic fever, as well as type 2 diabetes mellitus, who presented to the emergency room with acute onset of worsening dyspnea with associated orthopnea and paroxysmal nocturnal dyspnea over the last few days.    * Acute on chronic systolic (congestive) heart failure (HCC) --dyspnea, BLE swelling, BNP 1904, CXR showed vascular congestion.  Echo in Oct 2024 showed LVEF 30-35%. --Unsure which medications he is taking at home  --pt started on IV lasix 60 --cardio consulted --Patient did not tolerate spironolactone outpatient. Not candidate for SGLT2i given chronic foley.  Plan: --cont IV lasix 60 BID --cont metolazone --transition to [employer] tomorrow --cont Toprol 50 mg daily  Trop elevation 2/2 demand ischemia --trop 100's flat. 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 Underwent PPM implant in 2021 for CHB, has had regular device checks since implant.  -Recommend anticoagulation however patient has historically deferred given prior bleeding issues. --cont Toprol  # CKD 3a --monitor Cr while diuresing  Type 2 diabetes mellitus with chronic kidney disease, without long-term current use of insulin (HCC) --recent A1c 7.2 --hold home glipizide --ACHS and SSI  Chronic Foley --pt presented with home Foley  Chronic bilateral foot wounds --follows with outpatient wound care  --consult to wound care  Stasis dermatitis  LE cellulitis ruled  out  Acute hypoxic respiratory failure ruled out --no documented O2 desat   DVT prophylaxis: Lovenox SQ Code Status: Full code  Family Communication:  Level of care: Telemetry Cardiac Dispo:   The patient is from: home Anticipated d/c is to: home Anticipated d/c date is: 2-3 days   Subjective and Interval History:  Pt reported feeling tired, didn't sleep well.  RN reported pt complained of pain in his calf.   Objective: Vitals:   08/15/23 0538 08/15/23 0806 08/15/23 1156 08/15/23 1651  BP: (!) 115/59 130/70 109/65 113/69  Pulse: 85 85 72 (!) 128  Resp: 18     Temp: 98.4 F (36.9 C) 98.1 F (36.7 C) 98.3 F (36.8 C) 97.8 F (36.6 C)  TempSrc: Oral     SpO2: 96% 95% 100% 100%  Weight:      Height:        Intake/Output Summary (Last 24 hours) at 08/15/2023 1735 Last data filed at 08/14/2023 1825 Gross per 24 hour  Intake --  Output 450 ml  Net -450 ml   Filed Weights   08/13/23 1550  Weight: 113.4 kg    Examination:   Constitutional: NAD, AAOx3 HEENT: conjunctivae and lids normal, EOMI CV: No cyanosis.   RESP: normal respiratory effort Extremities: tense edema in BLE with erythema and skin changes SKIN: warm, dry Neuro: II - XII grossly intact.     Data Reviewed: I have personally reviewed labs and imaging studies  Time spent: 50 minutes  Darlin Priestly, MD [facility] If 7PM-7AM, please contact night-coverage 08/15/2023, 5:35 PM

## 2023-08-15 NOTE — Progress Notes (Signed)
Heart Failure Stewardship Pharmacy Note  PCP: Elder Negus, NP PCP-Cardiologist: None  HPI: Tyler [name] is a 77 y.o. male with HTN, HLD, DM, PVD, SSS (s/p PPM), dCHF, CKD-3A, obesity, rheumatic fever, atrial fibrillation not on anticoagulants, chronic diabetic foot ulcers, s/p third metatarsal head resection with bone biopsy right foot who presented with worsening dyspnea on exertion, PND, and orthopnea. Troponin on admission was 112. BNP on admission was elevated at 1904.9. CXR on admission showed stable vascular congestion.    Pertinent cardiac history: Long term holter in 03/2020 showed 6.1% PVC burden and multiple AF episodes. Ablation performed and pacemaker placed in 08/2020. Stress test 03/2023 showed no evidence of ischemia and LVEF of 57%. TTE in 08/2020 and 09/2020 showed LVEF of 55-60%. Echo 07/03/23 showed LVEF of 30-35% and moderate MR.    Pertinent Lab Values: Creatinine  Date Value Ref Range Status  07/10/2014 1.26 0.60 - 1.30 mg/dL Final   Creatinine, Ser  Date Value Ref Range Status  08/14/2023 1.61 (H) 0.61 - 1.24 mg/dL Final   BUN  Date Value Ref Range Status  08/14/2023 48 (H) 8 - 23 mg/dL Final  21/30/8657 10 7 - 18 mg/dL Final   Potassium  Date Value Ref Range Status  08/14/2023 4.1 3.5 - 5.1 mmol/L Final  07/10/2014 3.7 3.5 - 5.1 mmol/L Final   Sodium  Date Value Ref Range Status  08/14/2023 139 135 - 145 mmol/L Final  07/10/2014 142 136 - 145 mmol/L Final   B Natriuretic Peptide  Date Value Ref Range Status  08/13/2023 1,904.9 (H) 0.0 - 100.0 pg/mL Final    Comment:    Performed at [facility], [address] [place], [place] [address]   Magnesium  Date Value Ref Range Status  07/03/2023 2.4 1.7 - 2.4 mg/dL Final    Comment:    Performed at [facility], [address] [place], [place] [address]  07/10/2014 1.8 mg/dL Final    Comment:    4.1-3.2 THERAPEUTIC RANGE: 4-7 mg/dL TOXIC: > 10 mg/dL   -----------------------    Hemoglobin A1C  Date Value Ref Range Status  07/05/2014 10.4 (H) 4.2 - 6.3 % Final    Comment:    The American Diabetes Association recommends that a primary goal of therapy should be <7% and that physicians should reevaluate the treatment regimen in patients with HbA1c values consistently >8%. Hemoglobin A1c - ** THIS IS A CORRECTED REPORT **  - PLEASE DISREGARD PREVIOUS RESULT.  - Corrected report called to and read back  - by Crista Elliot on 1C @ 1155 07/06/14.  - BGB    Hgb A1c MFr Bld  Date Value Ref Range Status  07/02/2023 7.2 (H) 4.8 - 5.6 % Final    Comment:    (NOTE) Pre diabetes:          5.7%-6.4%  Diabetes:              >6.4%  Glycemic control for   <7.0% adults with diabetes    TSH  Date Value Ref Range Status  06/28/2020 1.726 0.350 - 4.500 uIU/mL Final    Comment:    Performed by a 3rd Generation assay with a functional sensitivity of <=0.01 uIU/mL. Performed at [facility], [address] [place], [place] [address]     Vital Signs: Admission weight: Temp:  [98.2 F (36.8 C)-99.1 F (37.3 C)] 98.4 F (36.9 C) (11/21 0538) Pulse Rate:  [71-87] 85 (11/21 0538) Cardiac Rhythm: A-V Sequential  paced (11/20 1700) Resp:  [18-24] 18 (11/21 0538) BP: (107-123)/(58-70) 115/59 (11/21 0538) SpO2:  [96 %-100 %] 96 % (11/21 0538)  Intake/Output Summary (Last 24 hours) at 08/15/2023 0746 Last data filed at 08/14/2023 1825 Gross per 24 hour  Intake --  Output 450 ml  Net -450 ml    Current Heart Failure Medications:  Loop diuretic: furosemide 60 mg IV BID Beta-Blocker: metoprolol succinate 50 mg daily ACEI/ARB/ARNI: losartan 25 mg daily MRA: none SGLT2i: none  Prior to admission Heart Failure Medications:  Loop diuretic: furosemide 40 mg daily Beta-Blocker: metoprolol succinate 50 mg daily ACEI/ARB/ARNI: none MRA: none SGLT2i: none Other: metolazone 2.5 mg once weekly  Assessment: 1. 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 NYHA class III symptoms.  -Symptoms: Reports shortness of breath has improved. LEE is present and painful per patient report.   -Volume: Appears to still be mildly hypervolemic on exam. Creatinine improving with diuresis and BUN stable. Not much documented urine output. Would continue diuresis today. -Hemodynamics: BP improved to 119-130s/60s with HR in 80s. -BB: Previously did not tolerate carvedilol due to blurred vision, now on metoprolol succinate 50 mg daily.  -ACE/ARB/ARNI: BP is improved after starting losartan 25 mg daily. Can consider transition to [facility] with stable BP. -MRA: Patient does not remember taking spironolactone and is fine with rechallenging. Can consider adding finerenone to discharge orders as it is renal protective and has HF benefits. -SGLT2i: Not a candidate given chronic foley catheter.  -Given AF and CHADSVASc >3, may benefit from chronic anticoagulation, however with history of bleeding, patient insisted staying off anticoagulation.   Plan: 1) Medication changes recommended at this time: -Can consider transition to Entresto if BP remains stable -Recommend starting [place] on discharge to help HF and CKD  2) Patient assistance: -Sherryll Burger and [name] are $0  3) Education: - Patient has been educated on current HF medications and potential additions to HF medication regimen - Patient verbalizes understanding that over the next few months, these medication doses may change and more medications may be added to optimize HF regimen - Patient has been educated on basic disease state pathophysiology and goals of therapy  Medication Assistance / Insurance Benefits Check: Does the patient have prescription insurance? Prescription Insurance: Commercial  Type of insurance plan:    Outpatient Pharmacy: [facility]     Please do not hesitate to reach out with questions or  concerns,  Enos Fling, PharmD, CPP, BCPS Heart Failure Pharmacist  Phone - [phone] 08/15/2023 7:46 AM

## 2023-08-15 NOTE — TOC Initial Note (Signed)
Transition of Care [facility]) - Initial/Assessment Note    Patient Details  Name: Tyler [name] MRN: [id number] Date of Birth: 1946-06-24  [facility]) CM/SW Contact:    Truddie Hidden, RN Phone Number: 08/15/2023, 3:57 PM  Clinical Narrative:                 RA completed  Admitted for: CHF Admitted from: Home with room mate  [facility] [address] [address]  Current home health/prior home health/DME: none Transportation:Room mate Appointment: Patient drives HH: No preference   Expected Discharge Plan: Home/Self Care Barriers to Discharge: Continued Medical Work up   Patient Goals and CMS Choice Patient states their goals for this hospitalization and ongoing recovery are:: home   Choice offered to / list presented to : Patient      Expected Discharge Plan and Services       Living arrangements for the past 2 months: Single Family Home                                      Prior Living Arrangements/Services Living arrangements for the past 2 months: Single Family Home Lives with:: Friends Patient language and need for interpreter reviewed:: Yes Do you feel safe going back to the place where you live?: Yes      Need for Family Participation in Patient Care: No (Comment) Care giver support system in place?: Yes (comment)   Criminal Activity/Legal Involvement Pertinent to Current Situation/Hospitalization: No - Comment as needed  Activities of Daily Living   ADL Screening (condition at time of admission) Independently performs ADLs?: Yes (appropriate for developmental age) Is the patient deaf or have difficulty hearing?: No Does the patient have difficulty seeing, even when wearing glasses/contacts?: No Does the patient have difficulty concentrating, remembering, or making decisions?: No  Permission Sought/Granted                  Emotional Assessment Appearance:: Appears stated age Attitude/Demeanor/Rapport: Gracious, Engaged Affect  (typically observed): Accepting Orientation: : Oriented to Self, Oriented to Place, Oriented to  Time, Oriented to Situation Alcohol / Substance Use: Not Applicable Psych Involvement: No (comment)  Admission diagnosis:  Acute on chronic systolic (congestive) heart failure (HCC) [I50.23] Acute on chronic congestive heart failure, unspecified heart failure type [facility]) [I50.9] Patient Active Problem List   Diagnosis Date Noted   Type 2 diabetes mellitus with chronic kidney disease, without long-term current use of insulin (HCC) 08/14/2023   Acute on chronic systolic (congestive) heart failure (HCC) 08/13/2023   Ulcerated, foot, right, with fat layer exposed (HCC) 07/06/2023   Ulcer of left foot with fat layer exposed (HCC) 07/06/2023   Acute on chronic diastolic CHF (congestive heart failure) (HCC) 07/02/2023   Diabetic foot ulcer (HCC) 07/02/2023   Type II diabetes mellitus with renal manifestations (HCC) 07/02/2023   Chronic kidney disease, stage 3a (HCC) 07/02/2023   Myocardial injury 07/02/2023   Cellulitis of right lower extremity 07/02/2023   Epistaxis 11/05/2022   Foot infection 11/03/2022   Cellulitis of right foot 10/31/2022   Type 2 diabetes mellitus with complication, without long-term current use of insulin (HCC) 10/31/2022   Persistent atrial fibrillation (HCC) 10/31/2022   Essential hypertension 10/31/2022   CKD (chronic kidney disease), stage III (HCC) 10/31/2022   Obesity (BMI 30-39.9) 10/31/2022   Gangrene of toe of right foot (HCC) 10/31/2022   PAD (peripheral artery disease) (  HCC) 10/31/2022   Right foot infection 10/30/2022   Syncope and collapse 06/28/2020   Acute CHF (congestive heart failure) (HCC) 06/28/2020   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   Diabetes mellitus without complication (HCC)    Sepsis (HCC) 07/03/2018   PCP:  Elder Negus, NP [facility]   [phone], [place] - [address] [place] [address] Phone:  [phone] Fax: [phone]  [facility] [address] [place] [address] Phone: [phone] Fax: [phone]     Social Determinants of Health (SDOH) Social History: SDOH Screenings   Food Insecurity: No Food Insecurity (08/14/2023)  Housing: Low Risk  (08/14/2023)  Transportation Needs: No Transportation Needs (08/14/2023)  Utilities: Not At Risk (08/14/2023)  Alcohol Screen: Low Risk  (08/14/2023)  Financial Resource Strain: Low Risk  (03/20/2023)   Received from [facility]  Social Connections: Unknown (02/01/2022)   Received from [facility], [facility]  Stress: No Stress Concern Present (09/07/2020)   Received from [facility], [facility]  Tobacco Use: Low Risk  (08/14/2023)   SDOH Interventions: Housing Interventions: Intervention Not Indicated Transportation Interventions: Intervention Not Indicated Alcohol Usage Interventions: Intervention Not Indicated (Score <7)   Readmission Risk Interventions    08/15/2023    3:57 PM  Readmission Risk Prevention Plan  Transportation Screening Complete  PCP or Specialist Appt within 3-5 Days Complete  HRI or Home Care Consult Complete  Social Work Consult for Recovery Care Planning/Counseling Complete  Palliative Care Screening Not Applicable  Medication Review Oceanographer) Complete

## 2023-08-15 NOTE — Plan of Care (Signed)
  Problem: Education: Goal: Knowledge of General Education information will improve Description: Including pain rating scale, medication(s)/side effects and non-pharmacologic comfort measures Outcome: Progressing   Problem: Health Behavior/Discharge Planning: Goal: Ability to manage health-related needs will improve Outcome: Progressing   Problem: Clinical Measurements: Goal: Ability to maintain clinical measurements within normal limits will improve Outcome: Progressing Goal: Will remain free from infection Outcome: Progressing Goal: Diagnostic test results will improve Outcome: Progressing Goal: Respiratory complications will improve Outcome: Progressing Goal: Cardiovascular complication will be avoided Outcome: Progressing   Problem: Activity: Goal: Risk for activity intolerance will decrease Outcome: Progressing   Problem: Nutrition: Goal: Adequate nutrition will be maintained Outcome: Progressing   Problem: Coping: Goal: Level of anxiety will decrease Outcome: Progressing   Problem: Elimination: Goal: Will not experience complications related to bowel motility Outcome: Progressing Goal: Will not experience complications related to urinary retention Outcome: Progressing   Problem: Pain Management: Goal: General experience of comfort will improve Outcome: Progressing   Problem: Safety: Goal: Ability to remain free from injury will improve Outcome: Progressing   Problem: Skin Integrity: Goal: Risk for impaired skin integrity will decrease Outcome: Progressing   Problem: Education: Goal: Ability to demonstrate management of disease process will improve Outcome: Progressing Goal: Ability to verbalize understanding of medication therapies will improve Outcome: Progressing Goal: Individualized Educational Video(s) Outcome: Progressing   Problem: Activity: Goal: Capacity to carry out activities will improve Outcome: Progressing   Problem: Cardiac: Goal:  Ability to achieve and maintain adequate cardiopulmonary perfusion will improve Outcome: Progressing   Problem: Education: Goal: Ability to describe self-care measures that may prevent or decrease complications (Diabetes Survival Skills Education) will improve Outcome: Progressing Goal: Individualized Educational Video(s) Outcome: Progressing   Problem: Coping: Goal: Ability to adjust to condition or change in health will improve Outcome: Progressing   Problem: Fluid Volume: Goal: Ability to maintain a balanced intake and output will improve Outcome: Progressing   Problem: Health Behavior/Discharge Planning: Goal: Ability to identify and utilize available resources and services will improve Outcome: Progressing Goal: Ability to manage health-related needs will improve Outcome: Progressing   Problem: Metabolic: Goal: Ability to maintain appropriate glucose levels will improve Outcome: Progressing   Problem: Nutritional: Goal: Maintenance of adequate nutrition will improve Outcome: Progressing Goal: Progress toward achieving an optimal weight will improve Outcome: Progressing   Problem: Skin Integrity: Goal: Risk for impaired skin integrity will decrease Outcome: Progressing   Problem: Tissue Perfusion: Goal: Adequacy of tissue perfusion will improve Outcome: Progressing

## 2023-08-15 NOTE — Progress Notes (Addendum)
[facility] CLINIC CARDIOLOGY PROGRESS NOTE       Patient ID: Tyler [name] MRN: [id number] DOB/AGE: 04/17/1946 77 y.o.  Admit date: 08/13/2023 Referring Physician Dr. Valente David Primary Physician Elder Negus, NP  Primary Cardiologist Dr. Corky Sing (no show appt 10/23) Reason for Consultation AoCHF  HPI: Tyler [name] is a 77 y.o. male  with a past medical history of chronic HFpEF, persistent atrial fibrillation, mitral insufficiency, CHB s/p PPM 2021, hypertension, chronic kidney disease stage III who presented to the ED on 08/13/2023 for shortness of breath. Cardiology was consulted for further evaluation.   Interval history: -Patient reports he feels well overall today. States his nose is "stuffy".  -Reports SOB is improving, denies any chest pain or palpitations. Weaned to room air.  -Cr improved on AM labs today. Endorses good UOP. -Endorses pain in his legs this AM, swelling appears overall improved.  Review of systems complete and found to be negative unless listed above    Past Medical History:  Diagnosis Date   (HFpEF) heart failure with preserved ejection fraction (HCC) 03/01/2020   a.) TTE 03/01/2020: EF 55-60%, mod MAC, mod AoV sclerosis, triv AR, mild TR, mod MR, RVSP 50-59; b.) TTE 09/10/2020: EF 55-60%, mod MAC, mod AoV sclerosis, mild TR, 3+ MR, RVSP 50-59; c.) TTE 09/26/2020: EF 55-60%, mild LA dil, triv PR, mild MR/TR, RVSP 37-49   Adenoma of left adrenal gland    Anemia    Arthritis    Atrial fibrillation and flutter (HCC)    a.) CHA2DS2VASc = 5 (age x2, HFpEF, HTN, T2DM);  b.) s/p CTI ablation 09/07/2020; c.) rate/rhythm maintained on oral carvedilol; not on chronic anticoagulation therapy   CAD (coronary artery disease)    Cardiomegaly    CKD (chronic kidney disease), stage III (HCC)    DOE (dyspnea on exertion)    Drug-induced bradycardia    Gangrene of toe of left foot (HCC)    a.) 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   Hepatosplenomegaly     History of bilateral cataract extraction    HLD (hyperlipidemia)    Hypertension    Long term current use of aspirin    Lymphedema of both lower extremities    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)    a.) s/p amputation 11/04/2022   Peripheral vascular disease (HCC)    Pleural effusion on right 09/09/2020   a.) s/p RIGHT thoracentesis with 2180 cc yield   Pneumonia    Presence of permanent cardiac pacemaker 09/10/2020   a.) TVP placement 09/10/2020 due to intermittent CHB in setting of urosepsis; b.) s/p PPM placement 09/15/2020: MDT Azure XT DR (SN: [id number])   Pulmonary hypertension (HCC) 03/01/2020   a.) TTE: 03/01/2020: RVSP 50-59; b.) TTE 09/10/2020: RVSP 50-59; c.) TTE 09/26/2020: RVSP 37-49   RA (rheumatoid arthritis) (HCC)    Rheumatic fever    Sepsis (HCC) 09/10/2020   a.) urosepsis --> BC x 2 sets and UC all grew out significant Proteus mirabilis; admitted to [facility] 09/07/2020 - 09/27/2020.   Sick sinus syndrome [facility])    a.) s/p MDT PPM placement 09/15/2020   T2DM (type 2 diabetes mellitus) (HCC)    Urinary retention    chronic, with indwelling Foley catheter and plans for a suprapubic   Wears dentures    full upper    Past Surgical History:  Procedure Laterality Date   AMPUTATION TOE Right 11/04/2022   Procedure: AMPUTATION TOE;  Surgeon: Felecia Shelling, [name];  [name];  Service: Podiatry;  Laterality: Right;   BONE BIOPSY Right 04/05/2023   Procedure: BONE BIOPSY THIRD & FOURTH;  Surgeon: Felecia Shelling, [name];  [name];  Service: Podiatry;  Laterality: Right;   CARDIAC ELECTROPHYSIOLOGY STUDY AND ABLATION N/A 09/07/2020   Procedure: CARDIAC EP STUDY AND ABLATION (CTI)   CATARACT EXTRACTION W/PHACO Right 01/16/2017   Procedure: CATARACT EXTRACTION PHACO AND INTRAOCULAR LENS PLACEMENT (IOC)  Right Complicated;  Surgeon: Lockie Mola, MD;  Location: [facility];  Service: Ophthalmology;  Laterality: Right;  IVA  Block Healon 5 malyugin vision blue Diabetic - oral meds   CATARACT EXTRACTION W/PHACO Left 10/23/2022   Procedure: CATARACT EXTRACTION PHACO AND INTRAOCULAR LENS PLACEMENT (IOC) LEFT DIABETIC;  Surgeon: Galen Manila, MD;  Location: [facility];  Service: Ophthalmology;  Laterality: Left;  Diabetic   COLONOSCOPY     LOWER EXTREMITY ANGIOGRAPHY Right 11/02/2022   Procedure: Lower Extremity Angiography;  Surgeon: Annice Needy, MD;  Location: [facility] INVASIVE CV LAB;  Service: Cardiovascular;  Laterality: Right;   METATARSAL HEAD EXCISION Left 07/05/2018   Procedure: RESECTION FIRST METATARSAL INFECTED BONE AND SOFT TISSUE;  Surgeon: Recardo Evangelist, [name];  [name];  Service: Podiatry;  Laterality: Left;   METATARSAL HEAD EXCISION Right 04/05/2023   Procedure: METATARSAL HEAD EXCISION THIRD & FOURTH;  Surgeon: Felecia Shelling, [name];  [name];  Service: Podiatry;  Laterality: Right;   PACEMAKER INSERTION  09/15/2020   TOE AMPUTATION Left    TONSILLECTOMY      Medications Prior to Admission  Medication Sig Dispense Refill Last Dose   aspirin EC 81 MG tablet Take 1 tablet (81 mg total) by mouth daily. Swallow whole. 30 tablet 0    furosemide (LASIX) 40 MG tablet Take 40 mg by mouth daily.      gentamicin cream (GARAMYCIN) 0.1 % Apply 1 Application topically 2 (two) times daily. 30 g 1    glipiZIDE (GLUCOTROL) 10 MG tablet Take 10 mg by mouth 2 (two) times daily.      metFORMIN (GLUCOPHAGE) 1000 MG tablet Take 1,000 mg by mouth 2 (two) times daily.      metolazone (ZAROXOLYN) 2.5 MG tablet Take 2.5 mg by mouth once a week. On Mondays      metoprolol succinate (TOPROL-XL) 50 MG 24 hr tablet Take 1 tablet (50 mg total) by mouth daily. Take with or immediately following a meal. 60 tablet 0    Multiple Vitamins-Minerals (MULTIVITAMIN WITH MINERALS) tablet Take 1 tablet by mouth daily.      OVER THE COUNTER MEDICATION 1 Scoop daily. Super Beets Powder      silver  sulfADIAZINE (SILVADENE) 1 % cream Apply to affected area daily 50 g 1    furosemide (LASIX) 40 MG tablet Take 1 tablet (40 mg total) by mouth 2 (two) times daily for 5 days. 10 tablet 0    Social History   Socioeconomic History   Marital status: Single    Spouse name: Not on file   Number of children: Not on file   Years of education: Not on file   Highest education level: Not on file  Occupational History    Comment: [employer]  Tobacco Use   Smoking status: Never   Smokeless tobacco: Never  Vaping Use   Vaping status: Never Used  Substance and Sexual Activity   Alcohol use: Not Currently    Comment: rarely   Drug use: Never   Sexual activity:  Not Currently    Birth control/protection: None  Other Topics Concern   Not on file  Social History Narrative   Lives with roommate   Social Determinants of Health   Financial Resource Strain: Low Risk  (03/20/2023)   Received from [facility]   Overall Financial Resource Strain (CARDIA)    Difficulty of Paying Living Expenses: Not hard at all  Food Insecurity: No Food Insecurity (08/14/2023)   Hunger Vital Sign    Worried About Running Out of Food in the Last Year: Never true    Ran Out of Food in the Last Year: Never true  Transportation Needs: No Transportation Needs (08/14/2023)   PRAPARE - Administrator, Civil Service (Medical): No    Lack of Transportation (Non-Medical): No  Physical Activity: Not on file  Stress: No Stress Concern Present (09/07/2020)   Received from [facility] - Occupational Stress Questionnaire    Feeling of Stress : Not at all  Social Connections: Unknown (02/01/2022)   Received from [facility]   Social Network    Social Network: Not on file  Intimate Partner Violence: Not At Risk (08/14/2023)   Humiliation, Afraid, Rape, and Kick questionnaire    Fear of Current or Ex-Partner: No     Emotionally Abused: No    Physically Abused: No    Sexually Abused: No    Family History  Problem Relation Age of Onset   Cancer Niece      Vitals:   08/14/23 1949 08/14/23 2107 08/15/23 0538 08/15/23 0806  BP: (!) 123/58 (!) 114/59 (!) 115/59 130/70  Pulse: 87 74 85 85  Resp: (!) 24  18   Temp: 99.1 F (37.3 C)  98.4 F (36.9 C) 98.1 F (36.7 C)  TempSrc: Oral  Oral   SpO2: 98% 100% 96% 95%  Weight:      Height:        PHYSICAL EXAM General: Chronically-ill appearing, well nourished, in no acute distress. HEENT: Normocephalic and atraumatic. Neck: No JVD.  Lungs: Normal respiratory effort on room air. Bibasilar crackles.  Heart: HRRR. Normal S1 and S2 without gallops or murmurs.  Abdomen: Non-distended appearing.  Msk: Normal strength and tone for age. Extremities: Warm and well perfused. No clubbing, cyanosis. Chronic cellulitis bilaterally with multiple ulcerations and wounds.  Neuro: Alert and oriented X 3. Psych: Answers questions appropriately.   Labs: Basic Metabolic Panel: Recent Labs    08/14/23 0638 08/15/23 0626  NA 139 138  K 4.1 4.0  CL 108 103  CO2 26 24  GLUCOSE 234* 198*  BUN 48* 48*  CREATININE 1.61* 1.49*  CALCIUM 8.9 8.7*  MG  --  2.2   Liver Function Tests: No results for input(s): "AST", "ALT", "ALKPHOS", "BILITOT", "PROT", "ALBUMIN" in the last 72 hours. No results for input(s): "LIPASE", "AMYLASE" in the last 72 hours. CBC: Recent Labs    08/14/23 0638 08/15/23 0626  WBC 8.4 8.1  HGB 9.7* 10.2*  HCT 30.8* 32.7*  MCV 89.8 90.6  PLT 220 224   Cardiac Enzymes: Recent Labs    08/13/23 1538 08/14/23 0638  TROPONINIHS 112* 93*   BNP: Recent Labs    08/13/23 1554  BNP 1,904.9*   D-Dimer: No results for input(s): "DDIMER" in the last 72 hours. Hemoglobin A1C: No results for input(s): "HGBA1C" in the last 72 hours. Fasting Lipid Panel: No results for input(s): "CHOL", "HDL", "LDLCALC", "TRIG", "  CHOLHDL", "LDLDIRECT"  in the last 72 hours. Thyroid Function Tests: No results for input(s): "TSH", "T4TOTAL", "T3FREE", "THYROIDAB" in the last 72 hours.  Invalid input(s): "FREET3" Anemia Panel: No results for input(s): "VITAMINB12", "FOLATE", "FERRITIN", "TIBC", "IRON", "RETICCTPCT" in the last 72 hours.   Radiology: DG Chest 2 View  Result Date: 08/13/2023 CLINICAL DATA:  Shortness of breath for several weeks, initial encounter EXAM: CHEST - 2 VIEW COMPARISON:  08/03/2023 FINDINGS: Cardiac shadow is enlarged but stable. Pacing device is again seen. Mild central vascular congestion is noted without significant edema. Chronic blunting of the costophrenic angles is seen. No focal infiltrate is noted. No bony abnormality is seen. IMPRESSION: Stable vascular congestion when compare with the prior exam. No focal infiltrate is noted. Electronically Signed   By: Alcide Clever M.D.   On: 08/13/2023 20:32   DG Chest 2 View  Result Date: 08/03/2023 CLINICAL DATA:  Shortness of breath. EXAM: CHEST - 2 VIEW COMPARISON:  Chest radiograph dated July 02, 2023. FINDINGS: Stable cardiomegaly. Stable left subclavian dual lead pacemaker. Aortic atherosclerosis. Central pulmonary vascular congestion with bilateral interstitial opacities. Small bilateral pleural effusions, more pronounced on the left. No pneumothorax. No acute osseous abnormality. IMPRESSION: 1. Cardiomegaly with findings suggestive of pulmonary vascular congestion and interstitial edema. 2. Small bilateral pleural effusions, left-greater-than-right. Electronically Signed   By: Hart Robinsons M.D.   On: 08/03/2023 20:27    ECHO 06/2023: 1. Left ventricular ejection fraction, by estimation, is 30 to 35%. The left ventricle has moderately decreased function. The left ventricle demonstrates regional wall motion abnormalities (see scoring diagram/findings for description). Indeterminate diastolic filling due to E-A fusion.   2. Right ventricular systolic function is  normal. The right ventricular size is normal.   3. The mitral valve is normal in structure. Moderate mitral valve regurgitation. No evidence of mitral stenosis.   4. The aortic valve is normal in structure. Aortic valve regurgitation is not visualized. No aortic stenosis is present.   5. The inferior vena cava is normal in size with greater than 50%  respiratory variability, suggesting right atrial pressure of 3 mmHg.   TELEMETRY reviewed by me 08/15/2023: not on tele  EKG reviewed by me: VP with PVCs rate 97 bpm  Data reviewed by me 08/15/2023: last 24h vitals tele labs imaging I/O hospitalist progress note  Principal Problem:   Acute on chronic systolic (congestive) heart failure (HCC) Active Problems:   Essential hypertension   Type 2 diabetes mellitus with chronic kidney disease, without long-term current use of insulin (HCC)    ASSESSMENT AND PLAN:  Tyler [name] is a 77 y.o. male  with a past medical history of chronic HFpEF, persistent atrial fibrillation, mitral insufficiency, CHB s/p PPM 2021, hypertension, chronic kidney disease stage III who presented to the ED on 08/13/2023 for shortness of breath. Cardiology was consulted for further evaluation.   # Acute on chronic heart failure reduced EF # Acute hypoxic respiratory failure # LE cellulitis Patient presented with worsening SOB, stable LE edema. Worsening for a few days prior to presentation. +DOE and orthopnea. Unsure which medications he is taking at home, did not show for his appointment in clinic but has seen [facility]. BNP elevated at 1900. Requiring supplemental O2.  -Continue IV lasix 60 mg bid.  -Will transition losartan to [facility]. Continue metoprolol succinate 50 mg daily. Patient reportedly did not tolerate spironolactone outpatient, could consider Kerendia at follow up appointment. Not candidate for SGLT2i given chronic foley.  -Wean  O2 as tolerated. -Mild and flat troponin elevation most consistent  with demand/supply mismatch and not ACS in the setting of acute heart failure. -No plan for further cardiac diagnostics at this time.   # CHB s/p Medtronic dual chamber PPM 08/2020 # Hx atrial fibrillation Patient with history of persistent atrial fibrillation previously on eliquis, this was discontinued due to hematuria in 2023. Underwent PPM implant in 2021 for CHB, has had regular device checks since implant.  -Beta-blocker as above for rate control -Recommend anticoagulation however patient has historically deferred given prior bleeding issues.  # Chronic kidney disease stage IIIa Patient with hx of CKD, baseline Cr 1.3-1.4. Cr this AM 1.49 -Continue to monitor renal function closely during diuresis.   This patient's plan of care was discussed and created with Dr. Darrold Junker and he is in agreement.  Signed: Gale Journey, PA-C  08/15/2023, 10:43 AM [facility] Cardiology

## 2023-08-16 ENCOUNTER — Other Ambulatory Visit: Payer: Self-pay

## 2023-08-16 DIAGNOSIS — I5023 Acute on chronic systolic (congestive) heart failure: Secondary | ICD-10-CM | POA: Diagnosis not present

## 2023-08-16 LAB — BASIC METABOLIC PANEL
Anion gap: 9 (ref 5–15)
BUN: 56 mg/dL — ABNORMAL HIGH (ref 8–23)
CO2: 24 mmol/L (ref 22–32)
Calcium: 8.5 mg/dL — ABNORMAL LOW (ref 8.9–10.3)
Chloride: 103 mmol/L (ref 98–111)
Creatinine, Ser: 1.67 mg/dL — ABNORMAL HIGH (ref 0.61–1.24)
GFR, Estimated: 41 mL/min — ABNORMAL LOW (ref >60)
Glucose, Bld: 196 mg/dL — ABNORMAL HIGH (ref 70–99)
Potassium: 3.9 mmol/L (ref 3.5–5.1)
Sodium: 136 mmol/L (ref 135–145)

## 2023-08-16 LAB — GLUCOSE, CAPILLARY
Glucose-Capillary: 186 mg/dL — ABNORMAL HIGH (ref 70–99)
Glucose-Capillary: 305 mg/dL — ABNORMAL HIGH (ref 70–99)
Glucose-Capillary: 197 mg/dL — ABNORMAL HIGH (ref 70–99)
Glucose-Capillary: 219 mg/dL — ABNORMAL HIGH (ref 70–99)

## 2023-08-16 LAB — MAGNESIUM: Magnesium: 2.2 mg/dL (ref 1.7–2.4)

## 2023-08-16 MED ORDER — FUROSEMIDE 40 MG PO TABS
60.0000 mg | ORAL_TABLET | Freq: Every day | ORAL | Status: DC
  Administered 2023-08-17 – 2023-08-18 (×2): 60 mg via ORAL
  Filled 2023-08-16 (×2): qty 1

## 2023-08-16 MED ORDER — KERENDIA 10 MG PO TABS
10.0000 mg | ORAL_TABLET | Freq: Every day | ORAL | 0 refills | Status: DC
  Filled 2023-08-16: qty 30, 30d supply, fill #0

## 2023-08-16 MED ORDER — INSULIN ASPART 100 UNIT/ML IJ SOLN
4.0000 [IU] | Freq: Three times a day (TID) | INTRAMUSCULAR | Status: DC
  Administered 2023-08-17 – 2023-08-18 (×4): 4 [IU] via SUBCUTANEOUS
  Filled 2023-08-16 (×4): qty 1

## 2023-08-16 NOTE — Progress Notes (Signed)
PROGRESS NOTE    Tyler [name]  WUJ:[id number] DOB: 12-Sep-1946 DOA: 08/13/2023 PCP: Elder Negus, NP  [id number]  LOS: 3 days   Brief hospital course:   Assessment & Plan: Tyler [name] is a 77 y.o. Caucasian male with medical history significant for HFrEF, stage III CKD, CAD, osteoarthritis, hypertension, dyslipidemia and rheumatic fever, as well as type 2 diabetes mellitus, who presented to the emergency room with acute onset of worsening dyspnea with associated orthopnea and paroxysmal nocturnal dyspnea over the last few days.    * Acute on chronic systolic (congestive) heart failure (HCC) --dyspnea, BLE swelling, BNP 1904, CXR showed vascular congestion.  Echo in Oct 2024 showed LVEF 30-35%. --Unsure which medications he is taking at home  --pt started on IV lasix 60 BID --cardio consulted --Patient did not tolerate spironolactone outpatient. Not candidate for SGLT2i given chronic foley.  Plan: --transition to oral lasix 60 mg daily tomorrow --cont metolazone --cont Entresto (new) --cont Toprol 50 mg daily --will start Kerendia 10 mg daily on discharge   Trop elevation 2/2 demand ischemia --trop 100's flat. 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 Underwent PPM implant in 2021 for CHB, has had regular device checks since implant.  -Recommend anticoagulation however patient has historically deferred given prior bleeding issues. --cont Toprol  # CKD 3a --monitor Cr while diuresing  Type 2 diabetes mellitus with chronic kidney disease, without long-term current use of insulin (HCC) --recent A1c 7.2 --hold home glipizide --ACHS and SSI --add mealtime 4u TID  Chronic Foley --pt presented with home Foley --Foley exchange prior to discharge  Chronic  bilateral foot wounds --follows with outpatient wound care  --consulted to wound care  Stasis dermatitis  LE cellulitis ruled out  Acute hypoxic respiratory failure ruled out --no documented O2 desat   DVT prophylaxis: Lovenox SQ Code Status: Full code  Family Communication:  Level of care: Telemetry Cardiac Dispo:   The patient is from: home Anticipated d/c is to: home Anticipated d/c date is: 1-2 days   Subjective and Interval History:  Pt reported leg swelling improved which led to more leg pain.  Still having good urine output.   Objective: Vitals:   08/16/23 0701 08/16/23 0814 08/16/23 1155 08/16/23 1623  BP:  127/77 101/61 100/64  Pulse:  91 63 65  Resp:  17 19 17   Temp:  98.2 F (36.8 C) 97.9 F (36.6 C) 97.8 F (36.6 C)  TempSrc:  Oral Oral Oral  SpO2:  97% 96% 100%  Weight: 114.4 kg     Height:        Intake/Output Summary (Last 24 hours) at 08/16/2023 1843 Last data filed at 08/16/2023 1027 Gross per 24 hour  Intake 100 ml  Output 450 ml  Net -350 ml   Filed Weights   08/13/23 1550 08/16/23 0701  Weight: 113.4 kg 114.4 kg    Examination:   Constitutional: NAD, AAOx3 HEENT: conjunctivae and lids normal, EOMI CV: No cyanosis.   RESP: normal respiratory effort, on RA Extremities: tense edema in BLE, improved erythema SKIN: warm, dry Neuro: II - XII grossly intact.     Data Reviewed: I have personally reviewed labs and imaging studies  Time spent: 35 minutes  Darlin Priestly, MD [facility] If 7PM-7AM, please contact night-coverage 08/16/2023, 6:43 PM

## 2023-08-16 NOTE — Inpatient Diabetes Management (Signed)
Inpatient Diabetes Program Recommendations  AACE/ADA: New Consensus Statement on Inpatient Glycemic Control (2015)  Target Ranges:  Prepandial:   less than 140 mg/dL      Peak postprandial:   less than 180 mg/dL (1-2 hours)      Critically ill patients:  140 - 180 mg/dL    Latest Reference Range & Units 08/15/23 08:11 08/15/23 11:58 08/15/23 16:49 08/15/23 20:43  Glucose-Capillary 70 - 99 mg/dL 295 (H)  2 units Novolog  285 (H)  5 units Novolog  146 (H)  1 unit Novolog  210 (H)  2 units Novolog    (H): Data is abnormally high   Latest Reference Range & Units 08/16/23 08:11 08/16/23 11:47  Glucose-Capillary 70 - 99 mg/dL 621 (H)  2 units Novolog  305 (H)  7 units Novolog   (H): Data is abnormally high     Home DM Meds: Glipizide 10 mg BID        Metformin 1000 mg BID  Current Orders: Novolog Sensitive Correction Scale/ SSI (0-9 units) TID AC + HS    MD- Note rise in CBGs in the afternoon after meals  Home oral DM meds are on hold  Please consider starting Novolog 4 units TID with meals HOLD if pt NPO HOLD if pt eats <50% meals     --Will follow patient during hospitalization--  Ambrose Finland RN, MSN, CDCES Diabetes Coordinator Inpatient Glycemic Control Team Team Pager: [phone] (8a-5p)

## 2023-08-16 NOTE — Progress Notes (Signed)
[facility] CLINIC CARDIOLOGY PROGRESS NOTE       Patient ID: Tyler [name] MRN: [id number] DOB/AGE: 1945/09/28 77 y.o.  Admit date: 08/13/2023 Referring Physician Dr. Valente David Primary Physician Elder Negus, NP  Primary Cardiologist Dr. Corky Sing (no show appt 10/23) Reason for Consultation AoCHF  HPI: Tyler [name] is a 77 y.o. male  with a past medical history of chronic HFpEF, persistent atrial fibrillation, mitral insufficiency, CHB s/p PPM 2021, hypertension, chronic kidney disease stage III who presented to the ED on 08/13/2023 for shortness of breath. Cardiology was consulted for further evaluation.   Interval history: -Patient reports he feels well overall today. Resting comfortably at the time of my evaluation.  -SOB significantly improved. Denies any CP or palpitations.  -Cr slightly up this AM, not much documented output throughout admission but he reports good UOP.  -Continues to report leg pain, swelling improved.  Review of systems complete and found to be negative unless listed above    Past Medical History:  Diagnosis Date   (HFpEF) heart failure with preserved ejection fraction (HCC) 03/01/2020   a.) TTE 03/01/2020: EF 55-60%, mod MAC, mod AoV sclerosis, triv AR, mild TR, mod MR, RVSP 50-59; b.) TTE 09/10/2020: EF 55-60%, mod MAC, mod AoV sclerosis, mild TR, 3+ MR, RVSP 50-59; c.) TTE 09/26/2020: EF 55-60%, mild LA dil, triv PR, mild MR/TR, RVSP 37-49   Adenoma of left adrenal gland    Anemia    Arthritis    Atrial fibrillation and flutter (HCC)    a.) CHA2DS2VASc = 5 (age x2, HFpEF, HTN, T2DM);  b.) s/p CTI ablation 09/07/2020; c.) rate/rhythm maintained on oral carvedilol; not on chronic anticoagulation therapy   CAD (coronary artery disease)    Cardiomegaly    CKD (chronic kidney disease), stage III (HCC)    DOE (dyspnea on exertion)    Drug-induced bradycardia    Gangrene of toe of left foot (HCC)    a.) 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    Hepatosplenomegaly    History of bilateral cataract extraction    HLD (hyperlipidemia)    Hypertension    Long term current use of aspirin    Lymphedema of both lower extremities    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)    a.) s/p amputation 11/04/2022   Peripheral vascular disease (HCC)    Pleural effusion on right 09/09/2020   a.) s/p RIGHT thoracentesis with 2180 cc yield   Pneumonia    Presence of permanent cardiac pacemaker 09/10/2020   a.) TVP placement 09/10/2020 due to intermittent CHB in setting of urosepsis; b.) s/p PPM placement 09/15/2020: MDT Azure XT DR (SN: [id number])   Pulmonary hypertension (HCC) 03/01/2020   a.) TTE: 03/01/2020: RVSP 50-59; b.) TTE 09/10/2020: RVSP 50-59; c.) TTE 09/26/2020: RVSP 37-49   RA (rheumatoid arthritis) (HCC)    Rheumatic fever    Sepsis (HCC) 09/10/2020   a.) urosepsis --> BC x 2 sets and UC all grew out significant Proteus mirabilis; admitted to [facility] 09/07/2020 - 09/27/2020.   Sick sinus syndrome [facility])    a.) s/p MDT PPM placement 09/15/2020   T2DM (type 2 diabetes mellitus) (HCC)    Urinary retention    chronic, with indwelling Foley catheter and plans for a suprapubic   Wears dentures    full upper    Past Surgical History:  Procedure Laterality Date   AMPUTATION TOE Right 11/04/2022   Procedure: AMPUTATION TOE;  Surgeon: Felecia Shelling, [name];  [name];  Service: Podiatry;  Laterality: Right;   BONE BIOPSY Right 04/05/2023   Procedure: BONE BIOPSY THIRD & FOURTH;  Surgeon: Felecia Shelling, [name];  [name];  Service: Podiatry;  Laterality: Right;   CARDIAC ELECTROPHYSIOLOGY STUDY AND ABLATION N/A 09/07/2020   Procedure: CARDIAC EP STUDY AND ABLATION (CTI)   CATARACT EXTRACTION W/PHACO Right 01/16/2017   Procedure: CATARACT EXTRACTION PHACO AND INTRAOCULAR LENS PLACEMENT (IOC)  Right Complicated;  Surgeon: Lockie Mola, MD;  Location: [facility];  Service: Ophthalmology;   Laterality: Right;  IVA Block Healon 5 malyugin vision blue Diabetic - oral meds   CATARACT EXTRACTION W/PHACO Left 10/23/2022   Procedure: CATARACT EXTRACTION PHACO AND INTRAOCULAR LENS PLACEMENT (IOC) LEFT DIABETIC;  Surgeon: Galen Manila, MD;  Location: [facility];  Service: Ophthalmology;  Laterality: Left;  Diabetic   COLONOSCOPY     LOWER EXTREMITY ANGIOGRAPHY Right 11/02/2022   Procedure: Lower Extremity Angiography;  Surgeon: Annice Needy, MD;  Location: [facility] INVASIVE CV LAB;  Service: Cardiovascular;  Laterality: Right;   METATARSAL HEAD EXCISION Left 07/05/2018   Procedure: RESECTION FIRST METATARSAL INFECTED BONE AND SOFT TISSUE;  Surgeon: Recardo Evangelist, [name];  [name];  Service: Podiatry;  Laterality: Left;   METATARSAL HEAD EXCISION Right 04/05/2023   Procedure: METATARSAL HEAD EXCISION THIRD & FOURTH;  Surgeon: Felecia Shelling, [name];  [name];  Service: Podiatry;  Laterality: Right;   PACEMAKER INSERTION  09/15/2020   TOE AMPUTATION Left    TONSILLECTOMY      Medications Prior to Admission  Medication Sig Dispense Refill Last Dose   aspirin EC 81 MG tablet Take 1 tablet (81 mg total) by mouth daily. Swallow whole. 30 tablet 0    furosemide (LASIX) 40 MG tablet Take 40 mg by mouth daily.      gentamicin cream (GARAMYCIN) 0.1 % Apply 1 Application topically 2 (two) times daily. 30 g 1    glipiZIDE (GLUCOTROL) 10 MG tablet Take 10 mg by mouth 2 (two) times daily.      metFORMIN (GLUCOPHAGE) 1000 MG tablet Take 1,000 mg by mouth 2 (two) times daily.      metolazone (ZAROXOLYN) 2.5 MG tablet Take 2.5 mg by mouth once a week. On Mondays      metoprolol succinate (TOPROL-XL) 50 MG 24 hr tablet Take 1 tablet (50 mg total) by mouth daily. Take with or immediately following a meal. 60 tablet 0    Multiple Vitamins-Minerals (MULTIVITAMIN WITH MINERALS) tablet Take 1 tablet by mouth daily.      OVER THE COUNTER MEDICATION 1 Scoop daily. Super Beets  Powder      silver sulfADIAZINE (SILVADENE) 1 % cream Apply to affected area daily 50 g 1    furosemide (LASIX) 40 MG tablet Take 1 tablet (40 mg total) by mouth 2 (two) times daily for 5 days. 10 tablet 0    Social History   Socioeconomic History   Marital status: Single    Spouse name: Not on file   Number of children: Not on file   Years of education: Not on file   Highest education level: Not on file  Occupational History    Comment: [employer]  Tobacco Use   Smoking status: Never   Smokeless tobacco: Never  Vaping Use   Vaping status: Never Used  Substance and Sexual Activity   Alcohol use: Not Currently    Comment: rarely   Drug use: Never   Sexual activity:  Not Currently    Birth control/protection: None  Other Topics Concern   Not on file  Social History Narrative   Lives with roommate   Social Determinants of Health   Financial Resource Strain: Low Risk  (03/20/2023)   Received from [facility], [facility]   Overall Financial Resource Strain (CARDIA)    Difficulty of Paying Living Expenses: Not hard at all  Food Insecurity: No Food Insecurity (08/14/2023)   Hunger Vital Sign    Worried About Running Out of Food in the Last Year: Never true    Ran Out of Food in the Last Year: Never true  Transportation Needs: No Transportation Needs (08/14/2023)   PRAPARE - Administrator, Civil Service (Medical): No    Lack of Transportation (Non-Medical): No  Physical Activity: Not on file  Stress: No Stress Concern Present (09/07/2020)   Received from [facility], [facility] - Occupational Stress Questionnaire    Feeling of Stress : Not at all  Social Connections: Unknown (02/01/2022)   Received from [facility], [facility]   Social Network    Social Network: Not on file  Intimate Partner Violence: Not At Risk (08/14/2023)   Humiliation, Afraid, Rape, and Kick questionnaire    Fear of Current  or Ex-Partner: No    Emotionally Abused: No    Physically Abused: No    Sexually Abused: No    Family History  Problem Relation Age of Onset   Cancer Niece      Vitals:   08/16/23 0314 08/16/23 0701 08/16/23 0814 08/16/23 1155  BP: 110/71  127/77 101/61  Pulse: 78  91 63  Resp: 18  17 19   Temp: 97.9 F (36.6 C)  98.2 F (36.8 C) 97.9 F (36.6 C)  TempSrc:   Oral Oral  SpO2: 98%  97% 96%  Weight:  114.4 kg    Height:        PHYSICAL EXAM General: Chronically-ill appearing, well nourished, in no acute distress. HEENT: Normocephalic and atraumatic. Neck: No JVD.  Lungs: Normal respiratory effort on room air. Mild bibasilar crackles.  Heart: HRRR. Normal S1 and S2 without gallops or murmurs.  Abdomen: Non-distended appearing.  Msk: Normal strength and tone for age. Extremities: Warm and well perfused. No clubbing, cyanosis. Chronic cellulitis bilaterally with multiple ulcerations and wounds.  Neuro: Alert and oriented X 3. Psych: Answers questions appropriately.   Labs: Basic Metabolic Panel: Recent Labs    08/15/23 0626 08/16/23 0252  NA 138 136  K 4.0 3.9  CL 103 103  CO2 24 24  GLUCOSE 198* 196*  BUN 48* 56*  CREATININE 1.49* 1.67*  CALCIUM 8.7* 8.5*  MG 2.2 2.2   Liver Function Tests: No results for input(s): "AST", "ALT", "ALKPHOS", "BILITOT", "PROT", "ALBUMIN" in the last 72 hours. No results for input(s): "LIPASE", "AMYLASE" in the last 72 hours. CBC: Recent Labs    08/14/23 0638 08/15/23 0626  WBC 8.4 8.1  HGB 9.7* 10.2*  HCT 30.8* 32.7*  MCV 89.8 90.6  PLT 220 224   Cardiac Enzymes: Recent Labs    08/13/23 1538 08/14/23 0638  TROPONINIHS 112* 93*   BNP: Recent Labs    08/13/23 1554  BNP 1,904.9*   D-Dimer: No results for input(s): "DDIMER" in the last 72 hours. Hemoglobin A1C: No results for input(s): "HGBA1C" in the last 72 hours. Fasting Lipid Panel: No results for input(s): "CHOL", "HDL", "LDLCALC", "TRIG", "CHOLHDL",  "LDLDIRECT" in  the last 72 hours. Thyroid Function Tests: No results for input(s): "TSH", "T4TOTAL", "T3FREE", "THYROIDAB" in the last 72 hours.  Invalid input(s): "FREET3" Anemia Panel: No results for input(s): "VITAMINB12", "FOLATE", "FERRITIN", "TIBC", "IRON", "RETICCTPCT" in the last 72 hours.   Radiology: DG Chest 2 View  Result Date: 08/13/2023 CLINICAL DATA:  Shortness of breath for several weeks, initial encounter EXAM: CHEST - 2 VIEW COMPARISON:  08/03/2023 FINDINGS: Cardiac shadow is enlarged but stable. Pacing device is again seen. Mild central vascular congestion is noted without significant edema. Chronic blunting of the costophrenic angles is seen. No focal infiltrate is noted. No bony abnormality is seen. IMPRESSION: Stable vascular congestion when compare with the prior exam. No focal infiltrate is noted. Electronically Signed   By: Alcide Clever M.D.   On: 08/13/2023 20:32   DG Chest 2 View  Result Date: 08/03/2023 CLINICAL DATA:  Shortness of breath. EXAM: CHEST - 2 VIEW COMPARISON:  Chest radiograph dated July 02, 2023. FINDINGS: Stable cardiomegaly. Stable left subclavian dual lead pacemaker. Aortic atherosclerosis. Central pulmonary vascular congestion with bilateral interstitial opacities. Small bilateral pleural effusions, more pronounced on the left. No pneumothorax. No acute osseous abnormality. IMPRESSION: 1. Cardiomegaly with findings suggestive of pulmonary vascular congestion and interstitial edema. 2. Small bilateral pleural effusions, left-greater-than-right. Electronically Signed   By: Hart Robinsons M.D.   On: 08/03/2023 20:27    ECHO 06/2023: 1. Left ventricular ejection fraction, by estimation, is 30 to 35%. The left ventricle has moderately decreased function. The left ventricle demonstrates regional wall motion abnormalities (see scoring diagram/findings for description). Indeterminate diastolic filling due to E-A fusion.   2. Right ventricular systolic  function is normal. The right ventricular size is normal.   3. The mitral valve is normal in structure. Moderate mitral valve regurgitation. No evidence of mitral stenosis.   4. The aortic valve is normal in structure. Aortic valve regurgitation is not visualized. No aortic stenosis is present.   5. The inferior vena cava is normal in size with greater than 50%  respiratory variability, suggesting right atrial pressure of 3 mmHg.   TELEMETRY reviewed by me 08/16/2023: not on tele  EKG reviewed by me: VP with PVCs rate 97 bpm  Data reviewed by me 08/16/2023: last 24h vitals tele labs imaging I/O hospitalist progress note  Principal Problem:   Acute on chronic systolic (congestive) heart failure (HCC) Active Problems:   Essential hypertension   Type 2 diabetes mellitus with chronic kidney disease, without long-term current use of insulin (HCC)    ASSESSMENT AND PLAN:  Tyler [name] is a 77 y.o. male  with a past medical history of chronic HFpEF, persistent atrial fibrillation, mitral insufficiency, CHB s/p PPM 2021, hypertension, chronic kidney disease stage III who presented to the ED on 08/13/2023 for shortness of breath. Cardiology was consulted for further evaluation.   # Acute on chronic heart failure reduced EF # Acute hypoxic respiratory failure # LE cellulitis Patient presented with worsening SOB, stable LE edema. Worsening for a few days prior to presentation. +DOE and orthopnea. Unsure which medications he is taking at home, did not show for his appointment in clinic but has seen [facility]. BNP elevated at 1900. Weaned to room air.  -DC IV lasix. Transition to po lasix 60 mg daily tomorrow.  -Continue entresto 24-26 mg twice daily, metoprolol succinate 50 mg daily. Patient reportedly did not tolerate spironolactone outpatient, will start Kerendia 10 mg daily on discharge. Not candidate for SGLT2i given chronic foley.  -  Mild and flat troponin elevation most consistent with  demand/supply mismatch and not ACS in the setting of acute heart failure. -No plan for further cardiac diagnostics at this time.   # CHB s/p Medtronic dual chamber PPM 08/2020 # Hx atrial fibrillation Patient with history of persistent atrial fibrillation previously on eliquis, this was discontinued due to hematuria in 2023. Underwent PPM implant in 2021 for CHB, has had regular device checks since implant.  -Beta-blocker as above for rate control -Recommend anticoagulation however patient has historically deferred given prior bleeding issues.  # Chronic kidney disease stage IIIa Patient with hx of CKD, baseline Cr 1.3-1.4. Cr this AM 1.67 -Continue to monitor renal function closely during diuresis.   This patient's plan of care was discussed and created with Dr. Darrold Junker and he is in agreement.  Signed: Gale Journey, PA-C  08/16/2023, 11:56 AM [facility] Cardiology

## 2023-08-16 NOTE — Plan of Care (Signed)
  Problem: Education: Goal: Knowledge of General Education information will improve Description Including pain rating scale, medication(s)/side effects and non-pharmacologic comfort measures Outcome: Progressing   

## 2023-08-16 NOTE — Progress Notes (Signed)
Heart Failure Stewardship Pharmacy Note  PCP: Elder Negus, NP PCP-Cardiologist: None  HPI: Tyler [name] is a 77 y.o. male with HTN, HLD, DM, PVD, SSS (s/p PPM), dCHF, CKD-3A, obesity, rheumatic fever, atrial fibrillation not on anticoagulants, chronic diabetic foot ulcers, s/p third metatarsal head resection with bone biopsy right foot who presented with worsening dyspnea on exertion, PND, and orthopnea. Troponin on admission was 112. BNP on admission was elevated at 1904.9. CXR on admission showed stable vascular congestion.    Pertinent cardiac history: Long term holter in 03/2020 showed 6.1% PVC burden and multiple AF episodes. Ablation performed and pacemaker placed in 08/2020. Stress test 03/2023 showed no evidence of ischemia and LVEF of 57%. TTE in 08/2020 and 09/2020 showed LVEF of 55-60%. Echo 07/03/23 showed LVEF of 30-35% and moderate MR.    Pertinent Lab Values: Creatinine  Date Value Ref Range Status  07/10/2014 1.26 0.60 - 1.30 mg/dL Final   Creatinine, Ser  Date Value Ref Range Status  08/14/2023 1.61 (H) 0.61 - 1.24 mg/dL Final   BUN  Date Value Ref Range Status  08/14/2023 48 (H) 8 - 23 mg/dL Final  76/16/0737 10 7 - 18 mg/dL Final   Potassium  Date Value Ref Range Status  08/14/2023 4.1 3.5 - 5.1 mmol/L Final  07/10/2014 3.7 3.5 - 5.1 mmol/L Final   Sodium  Date Value Ref Range Status  08/14/2023 139 135 - 145 mmol/L Final  07/10/2014 142 136 - 145 mmol/L Final   B Natriuretic Peptide  Date Value Ref Range Status  08/13/2023 1,904.9 (H) 0.0 - 100.0 pg/mL Final    Comment:    Performed at [facility], [address] [place], [place] [address]   Magnesium  Date Value Ref Range Status  07/03/2023 2.4 1.7 - 2.4 mg/dL Final    Comment:    Performed at [facility], [address] [place], [place] [address]  07/10/2014 1.8 mg/dL Final    Comment:    6.2-7.0 THERAPEUTIC RANGE: 4-7 mg/dL TOXIC: > 10 mg/dL   -----------------------    Hemoglobin A1C  Date Value Ref Range Status  07/05/2014 10.4 (H) 4.2 - 6.3 % Final    Comment:    The American Diabetes Association recommends that a primary goal of therapy should be <7% and that physicians should reevaluate the treatment regimen in patients with HbA1c values consistently >8%. Hemoglobin A1c - ** THIS IS A CORRECTED REPORT **  - PLEASE DISREGARD PREVIOUS RESULT.  - Corrected report called to and read back  - by Crista Elliot on 1C @ 1155 07/06/14.  - BGB    Hgb A1c MFr Bld  Date Value Ref Range Status  07/02/2023 7.2 (H) 4.8 - 5.6 % Final    Comment:    (NOTE) Pre diabetes:          5.7%-6.4%  Diabetes:              >6.4%  Glycemic control for   <7.0% adults with diabetes    TSH  Date Value Ref Range Status  06/28/2020 1.726 0.350 - 4.500 uIU/mL Final    Comment:    Performed by a 3rd Generation assay with a functional sensitivity of <=0.01 uIU/mL. Performed at [facility], [address] [place], [place] [address]     Vital Signs: Admission weight: Temp:  [97.8 F (36.6 C)-98.3 F (36.8 C)] 97.9 F (36.6 C) (11/22 0314) Pulse Rate:  [67-128] 78 (11/22 0314) Resp:  [18] 18 (  11/22 0314) BP: (92-130)/(53-71) 110/71 (11/22 0314) SpO2:  [95 %-100 %] 98 % (11/22 0314)  Intake/Output Summary (Last 24 hours) at 08/16/2023 0735 Last data filed at 08/15/2023 1500 Gross per 24 hour  Intake --  Output 550 ml  Net -550 ml    Current Heart Failure Medications:  Loop diuretic: furosemide 60 mg IV BID Beta-Blocker: metoprolol succinate 50 mg daily ACEI/ARB/ARNI: losartan 25 mg daily MRA: none SGLT2i: none  Prior to admission Heart Failure Medications:  Loop diuretic: furosemide 40 mg daily Beta-Blocker: metoprolol succinate 50 mg daily ACEI/ARB/ARNI: none MRA: none SGLT2i: none Other: metolazone 2.5 mg once weekly  Assessment: 1. 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  NYHA class III symptoms.  -Symptoms: Reports shortness of breath has improved. LEE is present, but appears at baseline.   -Volume: Appears to be euvolemic on exam. Creatinine and BUN increased. Not much documented urine output, however patient reports that he has had frequent urine output. Patient reports home furosemide did not work. Entresto will increase urine output some, but will still likely require home Lasix increase. [name] will also help with urine output -Hemodynamics: BP improved to 119-130s/60s with HR in 80s. -BB: Previously did not tolerate carvedilol due to blurred vision, now on metoprolol succinate 50 mg daily.  -ACE/ARB/ARNI: BP is improved after starting losartan 25 mg daily. Can consider transition to [facility] with stable BP. -MRA: Patient does not remember taking spironolactone and is fine with rechallenging. Can consider adding finerenone to discharge orders as it is renal protective and has HF benefits. -SGLT2i: Not a candidate given chronic foley catheter.  -Given AF and CHADSVASc >3, may benefit from chronic anticoagulation, however with history of bleeding, patient insisted staying off anticoagulation.   Plan: 1) Medication changes recommended at this time: -Recommend starting [place] on discharge to help HF and CKD -Recommend transition to oral diuretics today  2) Patient assistance: -Sherryll Burger and [name] are $0  3) Education: - Patient has been educated on current HF medications and potential additions to HF medication regimen - Patient verbalizes understanding that over the next few months, these medication doses may change and more medications may be added to optimize HF regimen - Patient has been educated on basic disease state pathophysiology and goals of therapy  Medication Assistance / Insurance Benefits Check: Does the patient have prescription insurance? Prescription Insurance: Commercial  Type of insurance plan:    Outpatient Pharmacy: Prior to  admission outpatient pharmacy: [facility]     Please do not hesitate to reach out with questions or concerns,  Enos Fling, PharmD, CPP, BCPS Heart Failure Pharmacist  Phone - [phone] 08/16/2023 7:35 AM

## 2023-08-16 NOTE — Consult Note (Addendum)
WOC Nurse Consult Note: Reason for Consult: Consult requested for bilat foot wounds.  Performed remotely after review of progress notes and photos in the EMR.  Pt is followed as an outpatient by Dr Logan Bores of the podiatry team and they saw him recently on 11/12.  He has chronic bilat foot wounds and they have ordered Betadine  gauze dressings.  Continue present plan of care and patient should follow-up with the podiatry team after discharge.  Topical treatment orders provided for bedside nurses to perform as follows: Apply Betadine moistened gause to bilat foot wounds Q day, then cover with Kerlex. Please re-consult if further assistance is needed.  Thank-you,  Cammie Mcgee MSN, RN, CWOCN, [place], CNS [phone]

## 2023-08-17 DIAGNOSIS — I5023 Acute on chronic systolic (congestive) heart failure: Secondary | ICD-10-CM | POA: Diagnosis not present

## 2023-08-17 LAB — BASIC METABOLIC PANEL
Anion gap: 7 (ref 5–15)
BUN: 54 mg/dL — ABNORMAL HIGH (ref 8–23)
CO2: 25 mmol/L (ref 22–32)
Calcium: 8.4 mg/dL — ABNORMAL LOW (ref 8.9–10.3)
Chloride: 101 mmol/L (ref 98–111)
Creatinine, Ser: 1.46 mg/dL — ABNORMAL HIGH (ref 0.61–1.24)
GFR, Estimated: 49 mL/min — ABNORMAL LOW (ref >60)
Glucose, Bld: 255 mg/dL — ABNORMAL HIGH (ref 70–99)
Potassium: 3.8 mmol/L (ref 3.5–5.1)
Sodium: 133 mmol/L — ABNORMAL LOW (ref 135–145)

## 2023-08-17 LAB — GLUCOSE, CAPILLARY
Glucose-Capillary: 218 mg/dL — ABNORMAL HIGH (ref 70–99)
Glucose-Capillary: 279 mg/dL — ABNORMAL HIGH (ref 70–99)
Glucose-Capillary: 129 mg/dL — ABNORMAL HIGH (ref 70–99)
Glucose-Capillary: 165 mg/dL — ABNORMAL HIGH (ref 70–99)

## 2023-08-17 LAB — MAGNESIUM: Magnesium: 2.3 mg/dL (ref 1.7–2.4)

## 2023-08-17 MED ORDER — FLUTICASONE PROPIONATE 50 MCG/ACT NA SUSP
1.0000 | Freq: Every day | NASAL | Status: DC
  Administered 2023-08-17 – 2023-08-18 (×2): 1 via NASAL
  Filled 2023-08-17: qty 16

## 2023-08-17 MED ORDER — ORAL CARE MOUTH RINSE: 15.0000 mL | OROMUCOSAL | Status: DC | PRN

## 2023-08-17 NOTE — Progress Notes (Signed)
  PROGRESS NOTE    Tyler [name]  FAO:[id number] DOB: 06-21-1946 DOA: 08/13/2023 PCP: Elder Negus, NP  [id number]  LOS: 4 days   Brief hospital course:   Assessment & Plan: Tyler [name] is a 77 y.o. Caucasian male with medical history significant for HFrEF, stage III CKD, CAD, osteoarthritis, hypertension, dyslipidemia and rheumatic fever, as well as type 2 diabetes mellitus, who presented to the emergency room with acute onset of worsening dyspnea with associated orthopnea and paroxysmal nocturnal dyspnea over the last few days.    * Acute on chronic systolic (congestive) heart failure (HCC) --dyspnea, BLE swelling, BNP 1904, CXR showed vascular congestion.  Echo in Oct 2024 showed LVEF 30-35%. --Unsure which medications he is taking at home  --pt started on IV lasix 60 BID --cardio consulted --Patient did not tolerate spironolactone outpatient. Not candidate for SGLT2i given chronic foley.  Plan: --transition to oral lasix 60 mg daily today --cont metolazone --cont Entresto (new) --cont Toprol 50 mg daily --f/u with Dr. Corky Sing 1-2 weeks after discharge  Trop elevation 2/2 demand ischemia --trop 100's flat. 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 Underwent PPM implant in 2021 for CHB, has had regular device checks since implant.  -Recommend anticoagulation however patient has historically deferred given prior bleeding issues. --cont Toprol  # CKD 3a --stable Cr while diuresing  Type 2 diabetes mellitus with chronic kidney disease, without long-term current use of insulin (HCC) --recent A1c 7.2 --hold home glipizide --ACHS and SSI --cont mealtime 4u TID  Chronic Foley --pt presented with home Foley --Foley exchange today  Chronic bilateral foot  wounds --follows with outpatient wound care  --consulted to wound care --wound care per order  Chronic Stasis dermatitis  LE cellulitis ruled out  Acute hypoxic respiratory failure ruled out --no documented O2 desat   DVT prophylaxis: Lovenox SQ Code Status: Full code  Family Communication:  Level of care: Telemetry Cardiac Dispo:   The patient is from: home Anticipated d/c is to: home Anticipated d/c date is: tomorrow   Subjective and Interval History:  No new complaint today.   Objective: Vitals:   08/17/23 0353 08/17/23 0732 08/17/23 1222 08/17/23 1651  BP: (!) 105/58 122/68 (!) 115/57 (!) 119/55  Pulse: 69 80 66 63  Resp: 18 18 18 18   Temp: 97.9 F (36.6 C) 98.4 F (36.9 C) 97.9 F (36.6 C) 98.1 F (36.7 C)  TempSrc:   Oral Oral  SpO2: 100% 100% 98% 100%  Weight:      Height:        Intake/Output Summary (Last 24 hours) at 08/17/2023 1832 Last data filed at 08/17/2023 1238 Gross per 24 hour  Intake 720 ml  Output 975 ml  Net -255 ml   Filed Weights   08/13/23 1550 08/16/23 0701  Weight: 113.4 kg 114.4 kg    Examination:   Constitutional: NAD, AAOx3 HEENT: conjunctivae and lids normal, EOMI CV: No cyanosis.   RESP: normal respiratory effort Extremities: tense edema in BLE with stasis dermatitis  SKIN: warm, dry Neuro: II - XII grossly intact.     Data Reviewed: I have personally reviewed labs and imaging studies  Time spent: 35 minutes  Darlin Priestly, MD [facility] If 7PM-7AM, please contact night-coverage 08/17/2023, 6:32 PM

## 2023-08-17 NOTE — Progress Notes (Signed)
[facility] Cardiology  SUBJECTIVE: Patient sitting in chair, reports feeling better, with less peripheral edema   Vitals:   08/16/23 1930 08/16/23 2303 08/17/23 0353 08/17/23 0732  BP: (!) 116/59 128/67 (!) 105/58 122/68  Pulse: 67 61 69 80  Resp: 19 18 18 18   Temp: 98 F (36.7 C) 99.1 F (37.3 C) 97.9 F (36.6 C) 98.4 F (36.9 C)  TempSrc:      SpO2: 97% 97% 100% 100%  Weight:      Height:         Intake/Output Summary (Last 24 hours) at 08/17/2023 0920 Last data filed at 08/17/2023 4259 Gross per 24 hour  Intake 820 ml  Output 775 ml  Net 45 ml      PHYSICAL EXAM  General: Well developed, well nourished, in no acute distress HEENT:  Normocephalic and atramatic Neck:  No JVD.  Lungs: Clear bilaterally to auscultation and percussion. Heart: HRRR . Normal S1 and S2 without gallops or murmurs.  Abdomen: Bowel sounds are positive, abdomen soft and non-tender  Msk:  Back normal, normal gait. Normal strength and tone for age. Extremities: No clubbing, cyanosis or edema.   Neuro: Alert and oriented X 3. Psych:  Good affect, responds appropriately   LABS: Basic Metabolic Panel: Recent Labs    08/16/23 0252 08/17/23 0409  NA 136 133*  K 3.9 3.8  CL 103 101  CO2 24 25  GLUCOSE 196* 255*  BUN 56* 54*  CREATININE 1.67* 1.46*  CALCIUM 8.5* 8.4*  MG 2.2 2.3   Liver Function Tests: No results for input(s): "AST", "ALT", "ALKPHOS", "BILITOT", "PROT", "ALBUMIN" in the last 72 hours. No results for input(s): "LIPASE", "AMYLASE" in the last 72 hours. CBC: Recent Labs    08/15/23 0626  WBC 8.1  HGB 10.2*  HCT 32.7*  MCV 90.6  PLT 224   Cardiac Enzymes: No results for input(s): "CKTOTAL", "CKMB", "CKMBINDEX", "TROPONINI" in the last 72 hours. BNP: Invalid input(s): "POCBNP" D-Dimer: No results for input(s): "DDIMER" in the last 72 hours. Hemoglobin A1C: No results for input(s): "HGBA1C" in the last 72 hours. Fasting Lipid Panel: No results for input(s): "CHOL",  "HDL", "LDLCALC", "TRIG", "CHOLHDL", "LDLDIRECT" in the last 72 hours. Thyroid Function Tests: No results for input(s): "TSH", "T4TOTAL", "T3FREE", "THYROIDAB" in the last 72 hours.  Invalid input(s): "FREET3" Anemia Panel: No results for input(s): "VITAMINB12", "FOLATE", "FERRITIN", "TIBC", "IRON", "RETICCTPCT" in the last 72 hours.  No results found.   Echo LVEF 30-35% 07/03/2023  TELEMETRY: Atrial sensing with ventricular pacing:  ASSESSMENT AND PLAN:  Principal Problem:   Acute on chronic systolic (congestive) heart failure (HCC) Active Problems:   Essential hypertension   Type 2 diabetes mellitus with chronic kidney disease, without long-term current use of insulin (HCC)    1.  Acute on chronic HFrEF, LVEF 30-34%, on good medical management (metoprolol succinate, Entresto, furosemide, metolazone), net 24-hour output open 775 cc 2.  Chronic lower extremity cellulitis 3.  Persistent atrial fibrillation, previously on Eliquis, discontinued due to hematuria, not anticoagulation per patient's wishes, metoprolol succinate for rate control 4.  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  Chronic kidney disease stage IIIa (BUN and creatinine 54 and 1.46, respectively)  Recommendations  1.  Agree with current therapy 2.  Continue furosemide 60 mg daily and metolazone 2.5 mg q. Monday 3.  Continue good medical management 4.  Consider discharge home in a.m. 5.  Follow-up Dr. Corky Sing 1 to 2 weeks   Lyn Hollingshead  Caley Volkert, MD, PhD, [facility] 08/17/2023 9:20 AM

## 2023-08-18 DIAGNOSIS — I5023 Acute on chronic systolic (congestive) heart failure: Secondary | ICD-10-CM | POA: Diagnosis not present

## 2023-08-18 LAB — BASIC METABOLIC PANEL
Anion gap: 9 (ref 5–15)
BUN: 57 mg/dL — ABNORMAL HIGH (ref 8–23)
CO2: 25 mmol/L (ref 22–32)
Calcium: 8.7 mg/dL — ABNORMAL LOW (ref 8.9–10.3)
Chloride: 102 mmol/L (ref 98–111)
Creatinine, Ser: 1.6 mg/dL — ABNORMAL HIGH (ref 0.61–1.24)
GFR, Estimated: 44 mL/min — ABNORMAL LOW (ref >60)
Glucose, Bld: 211 mg/dL — ABNORMAL HIGH (ref 70–99)
Potassium: 4.1 mmol/L (ref 3.5–5.1)
Sodium: 136 mmol/L (ref 135–145)

## 2023-08-18 LAB — CBC
HCT: 31.7 % — ABNORMAL LOW (ref 39.0–52.0)
Hemoglobin: 10.1 g/dL — ABNORMAL LOW (ref 13.0–17.0)
MCH: 28.6 pg (ref 26.0–34.0)
MCHC: 31.9 g/dL (ref 30.0–36.0)
MCV: 89.8 fL (ref 80.0–100.0)
Platelets: 225 10*3/uL (ref 150–400)
RBC: 3.53 MIL/uL — ABNORMAL LOW (ref 4.22–5.81)
RDW: 14.8 % (ref 11.5–15.5)
WBC: 7.7 10*3/uL (ref 4.0–10.5)
nRBC: 0 % (ref 0.0–0.2)

## 2023-08-18 LAB — GLUCOSE, CAPILLARY: Glucose-Capillary: 233 mg/dL — ABNORMAL HIGH (ref 70–99)

## 2023-08-18 MED ORDER — METFORMIN HCL 1000 MG PO TABS: 1000.0000 mg | ORAL_TABLET | Freq: Two times a day (BID) | ORAL | 2 refills | Status: DC

## 2023-08-18 MED ORDER — METOPROLOL SUCCINATE ER 50 MG PO TB24: 50.0000 mg | ORAL_TABLET | Freq: Every day | ORAL | 2 refills | Status: DC

## 2023-08-18 MED ORDER — ACETAMINOPHEN 500 MG PO TABS: 1000.0000 mg | ORAL_TABLET | Freq: Three times a day (TID) | ORAL | Status: DC | PRN

## 2023-08-18 MED ORDER — SACUBITRIL-VALSARTAN 24-26 MG PO TABS: 1.0000 | ORAL_TABLET | Freq: Two times a day (BID) | ORAL | 2 refills | Status: DC

## 2023-08-18 MED ORDER — KERENDIA 10 MG PO TABS: 10.0000 mg | ORAL_TABLET | Freq: Every day | ORAL | 2 refills | Status: DC

## 2023-08-18 MED ORDER — FUROSEMIDE 20 MG PO TABS: 60.0000 mg | ORAL_TABLET | Freq: Every day | ORAL | 2 refills | Status: DC

## 2023-08-18 MED ORDER — GLIPIZIDE 10 MG PO TABS: 10.0000 mg | ORAL_TABLET | Freq: Two times a day (BID) | ORAL | 2 refills | Status: DC

## 2023-08-18 MED ORDER — TRAMADOL HCL 50 MG PO TABS
50.0000 mg | ORAL_TABLET | Freq: Four times a day (QID) | ORAL | Status: DC | PRN
  Administered 2023-08-18: 50 mg via ORAL
  Filled 2023-08-18: qty 1

## 2023-08-18 MED ORDER — METOLAZONE 2.5 MG PO TABS: 2.5000 mg | ORAL_TABLET | ORAL | 2 refills | Status: DC

## 2023-08-18 NOTE — Discharge Summary (Signed)
Physician Discharge Summary   Tyler [name]  male DOB: [birth year]  ONG:[id number]  PCP: Elder Negus, NP  Admit date: 08/13/2023 Discharge date: 08/18/2023  Admitted From: home Disposition:  home CODE STATUS: Full code  Discharge Instructions     Diet - low sodium heart healthy   Complete by: As directed    Discharge instructions   Complete by: As directed    Your Lasix is increased from 40 mg to 60 mg daily.  You are started on [place].  Continue your home Toprol and metolazone. - -   Discharge wound care:   Complete by: As directed    Apply Betadine moistened gauze to bilateral foot wounds every day, then cover with [facility] Course:  For full details, please see H&P, progress notes, consult notes and ancillary notes.  Briefly,  Tyler [name] is a 77 y.o. Caucasian male with medical history significant for HFrEF, stage III CKD, CAD, osteoarthritis, hypertension, as well as type 2 diabetes mellitus, who presented to the emergency room with acute onset of worsening dyspnea with associated orthopnea and paroxysmal nocturnal dyspnea over the last few days.    * Acute on chronic systolic (congestive) heart failure (HCC) --dyspnea, BLE swelling, BNP 1904, CXR showed vascular congestion.  Echo in Oct 2024 showed LVEF 30-35%. --Unsure which medications he is taking at home  --cardio consulted. --pt started on IV lasix 60 BID and transitioned to oral lasix 60 mg daily on 11/23 by cardio. --Patient did not tolerate spironolactone outpatient. Not candidate for SGLT2i given chronic foley.  --cont metolazone --started on Entresto and Kerendia  --cont home Toprol 50 mg daily and metolazone --f/u with outpatient cardio Dr. Corky Sing 1-2 weeks after discharge   Trop elevation 2/2 demand ischemia --trop 100's flat. 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 Underwent PPM implant in 2021 for CHB, has had regular device checks since implant.  -Recommend anticoagulation however patient has historically deferred given prior bleeding issues. --cont Toprol   # CKD 3a --stable Cr while diuresing   Type 2 diabetes mellitus with chronic kidney disease, without long-term current use of insulin (HCC) --recent A1c 7.2 --discharged back on home regimen as below   Chronic Foley --pt presented with home Foley --Foley exchanged prior to discharge, per pt request.   Chronic bilateral foot wounds --follows with outpatient wound care  --consulted to inpatient wound care --wound care per order   Chronic Stasis dermatitis  LE cellulitis ruled out   Acute hypoxic respiratory failure ruled out --no documented O2 desat   Discharge Diagnoses:  Principal Problem:   Acute on chronic systolic (congestive) heart failure (HCC) Active Problems:   Type 2 diabetes mellitus with chronic kidney disease, without long-term current use of insulin (HCC)   Essential hypertension   30 Day Unplanned Readmission Risk Score    Flowsheet Row ED to Hosp-Admission (Current) from 08/13/2023 in [facility]U  30 Day Unplanned Readmission Risk Score (%) 25.44 Filed at 08/18/2023 0801       This score is the patient's risk of an unplanned readmission within 30 days of being discharged (0 -100%). The score is based on dignosis, age, lab data, medications, orders, and past utilization.   Low:  0-14.9  Medium: 15-21.9   High: 22-29.9   Extreme: 30 and above         Discharge Instructions:  Allergies as of 08/18/2023       Reactions   Eliquis [apixaban]    Dizziness  and vision change   Spironolactone Other (See Comments)   dizziness        Medication List     TAKE these medications     acetaminophen 500 MG tablet Commonly known as: TYLENOL Take 2 tablets (1,000 mg total) by mouth 3 (three) times daily as needed (for pain).   aspirin EC 81 MG tablet Take 1 tablet (81 mg total) by mouth daily. Swallow whole.   furosemide 20 MG tablet Commonly known as: LASIX Take 3 tablets (60 mg total) by mouth daily. What changed:  medication strength how much to take Another medication with the same name was removed. Continue taking this medication, and follow the directions you see here.   gentamicin cream 0.1 % Commonly known as: GARAMYCIN Apply 1 Application topically 2 (two) times daily.   glipiZIDE 10 MG tablet Commonly known as: GLUCOTROL Take 1 tablet (10 mg total) by mouth 2 (two) times daily.   Kerendia 10 MG Tabs Generic drug: Finerenone Take 1 tablet (10 mg total) by mouth daily.   metFORMIN 1000 MG tablet Commonly known as: GLUCOPHAGE Take 1 tablet (1,000 mg total) by mouth 2 (two) times daily.   metolazone 2.5 MG tablet Commonly known as: ZAROXOLYN Take 1 tablet (2.5 mg total) by mouth once a week. On Mondays   metoprolol succinate 50 MG 24 hr tablet Commonly known as: TOPROL-XL Take 1 tablet (50 mg total) by mouth daily. Take with or immediately following a meal.   multivitamin with minerals tablet Take 1 tablet by mouth daily.   OVER THE COUNTER MEDICATION 1 Scoop daily. Super Beets Powder   sacubitril-valsartan 24-26 MG Commonly known as: ENTRESTO Take 1 tablet by mouth 2 (two) times daily.   silver sulfADIAZINE 1 % cream Commonly known as: SILVADENE Apply to affected area daily               Discharge Care Instructions  (From admission, onward)           Start     Ordered   08/18/23 0000  Discharge wound care:       Comments: Apply Betadine moistened gauze to bilateral foot wounds every day, then cover with Kerlex - -   08/18/23 0856             Follow-up Information     Alluri, Meryl Dare, MD. Go in 1 week(s).    Specialty: Cardiology Contact information: [address] [place] [address] [phone]         [facility] Follow up in 11 day(s).   Specialty: Cardiology Why: Established CHF appointment with Clarisa Kindred already scheduled on 08/26/23 @ 10:00 AM Please bring all medications to follow-up appointment [facility] [address] Contact information: [address] [place] [address] [phone]                Allergies  Allergen Reactions   Eliquis [Apixaban]     Dizziness  and vision change   Spironolactone Other (See Comments)    dizziness     The results of significant diagnostics from this hospitalization (including imaging, microbiology, ancillary and laboratory) are listed below for reference.   Consultations:  Procedures/Studies: DG Chest 2 View  Result Date: 08/13/2023 CLINICAL DATA:  Shortness of breath for several weeks, initial encounter EXAM: CHEST - 2 VIEW COMPARISON:  08/03/2023 FINDINGS: Cardiac shadow is enlarged but stable. Pacing device is again seen. Mild central vascular congestion is noted without significant edema. Chronic blunting of the costophrenic angles is seen. No focal infiltrate is noted. No bony abnormality is seen. IMPRESSION: Stable vascular congestion when compare with the prior exam. No focal infiltrate is noted. Electronically Signed   By: Alcide Clever M.D.   On: 08/13/2023 20:32   DG Chest 2 View  Result Date: 08/03/2023 CLINICAL DATA:  Shortness of breath. EXAM: CHEST - 2 VIEW COMPARISON:  Chest radiograph dated July 02, 2023. FINDINGS: Stable cardiomegaly. Stable left subclavian dual lead pacemaker. Aortic atherosclerosis. Central pulmonary vascular congestion with bilateral interstitial opacities. Small bilateral pleural effusions, more pronounced on the left. No pneumothorax. No acute osseous abnormality. IMPRESSION: 1.  Cardiomegaly with findings suggestive of pulmonary vascular congestion and interstitial edema. 2. Small bilateral pleural effusions, left-greater-than-right. Electronically Signed   By: Hart Robinsons M.D.   On: 08/03/2023 20:27      Labs: BNP (last 3 results) Recent Labs    07/02/23 1247 08/13/23 1554  BNP 1,066.9* 1,904.9*   Basic Metabolic Panel: Recent Labs  Lab 08/14/23 1610 08/15/23 0626 08/16/23 0252 08/17/23 0409 08/18/23 0306  NA 139 138 136 133* 136  K 4.1 4.0 3.9 3.8 4.1  CL 108 103 103 101 102  CO2 26 24 24 25 25   GLUCOSE 234* 198* 196* 255* 211*  BUN 48* 48* 56* 54* 57*  CREATININE 1.61* 1.49* 1.67* 1.46* 1.60*  CALCIUM 8.9 8.7* 8.5* 8.4* 8.7*  MG  --  2.2 2.2 2.3  --    Liver Function Tests: No results for input(s): "AST", "ALT", "ALKPHOS", "BILITOT", "PROT", "ALBUMIN" in the last 168 hours. No results for input(s): "LIPASE", "AMYLASE" in the last 168 hours. No results for input(s): "AMMONIA" in the last 168 hours. CBC: Recent Labs  Lab 08/13/23 1554 08/14/23 0638 08/15/23 0626 08/18/23 0306  WBC 8.9 8.4 8.1 7.7  HGB 10.4* 9.7* 10.2* 10.1*  HCT 32.8* 30.8* 32.7* 31.7*  MCV 90.1 89.8 90.6 89.8  PLT 241 220 224 225   Cardiac Enzymes: No results for input(s): "CKTOTAL", "CKMB", "CKMBINDEX", "TROPONINI" in the last 168 hours. BNP: Invalid input(s): "POCBNP" CBG: Recent Labs  Lab 08/16/23 2057 08/17/23 0734 08/17/23 1229 08/17/23 1657 08/17/23 2242  GLUCAP 219* 218* 279* 129* 165*   D-Dimer No results for input(s): "DDIMER" in the last 72 hours. Hgb A1c No results for input(s): "HGBA1C" in the last 72 hours. Lipid Profile No results for input(s): "CHOL", "HDL", "LDLCALC", "TRIG", "CHOLHDL", "LDLDIRECT" in the last 72 hours. Thyroid function studies No results for input(s): "TSH", "T4TOTAL", "T3FREE", "THYROIDAB" in the last 72 hours.  Invalid input(s): "FREET3" Anemia work up No results for input(s): "VITAMINB12", "FOLATE",  "FERRITIN", "TIBC", "IRON", "RETICCTPCT" in the last 72 hours. Urinalysis    Component Value Date/Time   COLORURINE YELLOW (A) 07/04/2023 0640   APPEARANCEUR CLEAR (A) 07/04/2023 0640   APPEARANCEUR Clear 07/05/2014 2040   LABSPEC 1.015 07/04/2023 0640   LABSPEC 1.030 07/05/2014 2040   PHURINE 6.0 07/04/2023 0640   GLUCOSEU NEGATIVE 07/04/2023 0640   GLUCOSEU >=500 07/05/2014 2040   HGBUR NEGATIVE 07/04/2023 0640   BILIRUBINUR NEGATIVE 07/04/2023 0640   BILIRUBINUR Negative 07/05/2014 2040   KETONESUR NEGATIVE 07/04/2023 0640   PROTEINUR NEGATIVE 07/04/2023 0640   NITRITE  NEGATIVE 07/04/2023 0640   LEUKOCYTESUR TRACE (A) 07/04/2023 0640   LEUKOCYTESUR Negative 07/05/2014 2040   Sepsis Labs Recent Labs  Lab 08/13/23 1554 08/14/23 3086 08/15/23 0626 08/18/23 0306  WBC 8.9 8.4 8.1 7.7   Microbiology No results found for this or any previous visit (from the past 240 hour(s)).   Total time spend on discharging this patient, including the last patient exam, discussing the hospital stay, instructions for ongoing care as it relates to all pertinent caregivers, as well as preparing the medical discharge records, prescriptions, and/or referrals as applicable, is 35 minutes.    Darlin Priestly, MD  [facility] 08/18/2023, 8:57 AM

## 2023-08-21 ENCOUNTER — Telehealth: Payer: Self-pay | Admitting: Family

## 2023-08-21 NOTE — Telephone Encounter (Signed)
Pt confirmed appt 08/26/23

## 2023-08-26 ENCOUNTER — Ambulatory Visit (HOSPITAL_BASED_OUTPATIENT_CLINIC_OR_DEPARTMENT_OTHER): Payer: BC Managed Care – PPO | Admitting: Family

## 2023-08-26 ENCOUNTER — Other Ambulatory Visit
Admission: RE | Admit: 2023-08-26 | Discharge: 2023-08-26 | Disposition: A | Discharge status: Home / Self Care | Payer: BC Managed Care – PPO | Source: Ambulatory Visit | Attending: Family | Admitting: Family

## 2023-08-26 ENCOUNTER — Encounter: Payer: Self-pay | Admitting: Family

## 2023-08-26 VITALS — BP 150/75 | HR 102 | Resp 22 | Wt 255.1 lb

## 2023-08-26 DIAGNOSIS — I5022 Chronic systolic (congestive) heart failure: Secondary | ICD-10-CM | POA: Diagnosis not present

## 2023-08-26 DIAGNOSIS — I10 Essential (primary) hypertension: Secondary | ICD-10-CM

## 2023-08-26 DIAGNOSIS — I4819 Other persistent atrial fibrillation: Secondary | ICD-10-CM | POA: Diagnosis not present

## 2023-08-26 DIAGNOSIS — I441 Atrioventricular block, second degree: Secondary | ICD-10-CM

## 2023-08-26 DIAGNOSIS — I890 Lymphedema, not elsewhere classified: Secondary | ICD-10-CM

## 2023-08-26 DIAGNOSIS — N1831 Chronic kidney disease, stage 3a: Secondary | ICD-10-CM

## 2023-08-26 DIAGNOSIS — E1122 Type 2 diabetes mellitus with diabetic chronic kidney disease: Secondary | ICD-10-CM | POA: Diagnosis not present

## 2023-08-26 LAB — BASIC METABOLIC PANEL
Anion gap: 10 (ref 5–15)
BUN: 43 mg/dL — ABNORMAL HIGH (ref 8–23)
CO2: 24 mmol/L (ref 22–32)
Calcium: 8.8 mg/dL — ABNORMAL LOW (ref 8.9–10.3)
Chloride: 101 mmol/L (ref 98–111)
Creatinine, Ser: 1.71 mg/dL — ABNORMAL HIGH (ref 0.61–1.24)
GFR, Estimated: 40 mL/min — ABNORMAL LOW (ref >60)
Glucose, Bld: 258 mg/dL — ABNORMAL HIGH (ref 70–99)
Potassium: 4.6 mmol/L (ref 3.5–5.1)
Sodium: 135 mmol/L (ref 135–145)

## 2023-08-26 NOTE — Patient Instructions (Addendum)
Wound center is located at [address], [address] in the [place] Building across the street from our office. If you need to call them, their number is [phone]   We will call you for any abnormal lab results.    Follow up with Clarisa Kindred in 1-2 weeks

## 2023-08-26 NOTE — Progress Notes (Signed)
ReDS Vest / Clip - 08/26/23 1035       ReDS Vest / Clip   Station Marker D    Ruler Value 33    ReDS Value Range High volume overload    ReDS Actual Value 50

## 2023-08-26 NOTE — Progress Notes (Signed)
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   Cardiac history dates back to 06/21 when he was first diagnosed with HF. Had also recently been diagnosed with atrial fibrillation.   Admitted 10/30/22 due to infection of right third toe. Noted his right third toe turning dark few days ago and complained of pain in the affected area as well as fever. Right foot x-ray showed soft tissue edema overlies the dorsum of the foot. No soft tissue gas or radiopaque foreign body. No radiographic findings of osteomyelitis. IV antibiotics given. ABI abn. Vascular consulted. RLE angiography done. Amputation  of R 3rd toe done. Post-op had significant epistaxis L nare requiring insertion rhino rocket packing overnight, holding heparin/ASA. Restarted at discharge. Admitted 07/02/23 due to shortness of breath and worsening leg edema. Chronic diabetic foot ulcers. Found to have cellulitis involving the right lower extremity along with an elevated BNP of 1066. Patient was seen by podiatrist and underwent bilateral lower extremity imaging however they did not show any evidence of underlying osteomyelitis. Cardiology consulted. IV diuresed with transition to oral diuretics. Was in the ED 07/19/23 due to mechanical fall where he slipped and hit his knees on the cabinet. No head injury or LOC.    Was in the ED 08/03/23 due to shortness of breath and worsening pedal edema. CXR  with pulmonary edema. No STEMI. Lasix provided. Admitted 08/13/23 due to acute  worsening of his SOB with associated orthopnea and PND. Cardiology consulted. IV lasix given and then transitioned to an increased oral lasix dose. Troponins 100's flat.   Echo 09/20/20: EF 55-60% with 3+ TR Echo 07/03/23: EF 30-35% with moderate Tyler  He presents today for a follow-up HF visit with a chief complaint of minimal shortness of breath (improving) with moderate exertion. Has associated fatigue, stable chest pain, intermittent productive cough and pedal edema along with this. He feels like his breathing and swelling have improved since his recent admission. Denies palpitations, abdominal distention, dizziness, weight gain or difficulty sleeping.    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 Does not add salt to his food.   Today he took some of his medications but can't state with certainty which ones he took and he did not bring the bottles with him. Plans to take the rest of them when he returns home.   ROS: All systems negative except as listed in HPI, PMH and Problem List.  SH:  Social History   Socioeconomic History   Marital status: Single    Spouse name: Not on file   Number of children: Not on file   Years of education: Not on file   Highest education level: Not on file  Occupational History    Comment: [employer]  Tobacco Use   Smoking status: Never   Smokeless tobacco: Never  Vaping Use   Vaping status: Never Used  Substance and Sexual Activity   Alcohol use: Not Currently    Comment: rarely   Drug use: Never   Sexual activity: Not Currently    Birth control/protection: None  Other Topics Concern  Not on file  Social History Narrative   Lives with roommate   Social Determinants of Health   Financial Resource Strain: Low Risk  (03/20/2023)   Received from [facility], [facility]   Overall Financial Resource Strain (CARDIA)    Difficulty of Paying Living Expenses: Not hard at all  Food Insecurity: No Food Insecurity (08/14/2023)   Hunger Vital Sign     Worried About Running Out of Food in the Last Year: Never true    Ran Out of Food in the Last Year: Never true  Transportation Needs: No Transportation Needs (08/14/2023)   PRAPARE - Administrator, Civil Service (Medical): No    Lack of Transportation (Non-Medical): No  Physical Activity: Not on file  Stress: No Stress Concern Present (09/07/2020)   Received from [facility], [facility] of Occupational Health - Occupational Stress Questionnaire    Feeling of Stress : Not at all  Social Connections: Unknown (02/01/2022)   Received from [facility], [facility]   Social Network    Social Network: Not on file  Intimate Partner Violence: Not At Risk (08/14/2023)   Humiliation, Afraid, Rape, and Kick questionnaire    Fear of Current or Ex-Partner: No    Emotionally Abused: No    Physically Abused: No    Sexually Abused: No    FH:  Family History  Problem Relation Age of Onset   Cancer Niece     Past Medical History:  Diagnosis Date   (HFpEF) heart failure with preserved ejection fraction (HCC) 03/01/2020   a.) TTE 03/01/2020: EF 55-60%, mod MAC, mod AoV sclerosis, triv AR, mild TR, mod Tyler, RVSP 50-59; b.) TTE 09/10/2020: EF 55-60%, mod MAC, mod AoV sclerosis, mild TR, 3+ Tyler, RVSP 50-59; c.) TTE 09/26/2020: EF 55-60%, mild LA dil, triv PR, mild Tyler/TR, RVSP 37-49   Adenoma of left adrenal gland    Anemia    Arthritis    Atrial fibrillation and flutter (HCC)    a.) CHA2DS2VASc = 5 (age x2, HFpEF, HTN, T2DM);  b.) s/p CTI ablation 09/07/2020; c.) rate/rhythm maintained on oral carvedilol; not on chronic anticoagulation therapy   CAD (coronary artery disease)    Cardiomegaly    CKD (chronic kidney disease), stage III (HCC)    DOE (dyspnea on exertion)    Drug-induced bradycardia    Gangrene of toe of left foot (HCC)    a.) 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    HLD  (hyperlipidemia)    Hypertension    Long term current use of aspirin    Lymphedema of both lower extremities    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)    a.) s/p amputation 11/04/2022   Peripheral vascular disease (HCC)    Pleural effusion on right 09/09/2020   a.) s/p RIGHT thoracentesis with 2180 cc yield   Pneumonia    Presence of permanent cardiac pacemaker 09/10/2020   a.) TVP placement 09/10/2020 due to intermittent CHB in setting of urosepsis; b.) s/p PPM placement 09/15/2020: MDT Azure XT DR (SN: [id number])   Pulmonary hypertension (HCC) 03/01/2020   a.) TTE: 03/01/2020: RVSP 50-59; b.) TTE 09/10/2020: RVSP 50-59; c.) TTE 09/26/2020: RVSP 37-49   RA (rheumatoid arthritis) (HCC)    Rheumatic fever    Sepsis (HCC) 09/10/2020   a.) urosepsis --> BC x 2 sets and UC all grew out significant Proteus mirabilis;  admitted to [facility] 09/07/2020 - 09/27/2020.   Sick sinus syndrome [facility])    a.) s/p MDT PPM placement 09/15/2020   T2DM (type 2 diabetes mellitus) (HCC)    Urinary retention    chronic, with indwelling Foley catheter and plans for a suprapubic   Wears dentures    full upper    Current Outpatient Medications  Medication Sig Dispense Refill   acetaminophen (TYLENOL) 500 MG tablet Take 2 tablets (1,000 mg total) by mouth 3 (three) times daily as needed (for pain).     aspirin EC 81 MG tablet Take 1 tablet (81 mg total) by mouth daily. Swallow whole. 30 tablet 0   Finerenone (KERENDIA) 10 MG TABS Take 1 tablet (10 mg total) by mouth daily. 30 tablet 2   furosemide (LASIX) 20 MG tablet Take 3 tablets (60 mg total) by mouth daily. 90 tablet 2   gentamicin cream (GARAMYCIN) 0.1 % Apply 1 Application topically 2 (two) times daily. 30 g 1   glipiZIDE (GLUCOTROL) 10 MG tablet Take 1 tablet (10 mg total) by mouth 2 (two) times daily. 60 tablet 2   metFORMIN (GLUCOPHAGE) 1000 MG tablet Take 1 tablet (1,000 mg total) by mouth 2 (two) times daily. 60 tablet 2    metolazone (ZAROXOLYN) 2.5 MG tablet Take 1 tablet (2.5 mg total) by mouth once a week. On Mondays 4 tablet 2   metoprolol succinate (TOPROL-XL) 50 MG 24 hr tablet Take 1 tablet (50 mg total) by mouth daily. Take with or immediately following a meal. 30 tablet 2   Multiple Vitamins-Minerals (MULTIVITAMIN WITH MINERALS) tablet Take 1 tablet by mouth daily.     OVER THE COUNTER MEDICATION 1 Scoop daily. Super Beets Powder     sacubitril-valsartan (ENTRESTO) 24-26 MG Take 1 tablet by mouth 2 (two) times daily. 60 tablet 2   silver sulfADIAZINE (SILVADENE) 1 % cream Apply to affected area daily 50 g 1   No current facility-administered medications for this visit.   Vitals:   08/26/23 1002  BP: (!) 150/75  Pulse: (!) 102  Resp: (!) 22  Weight: 255 lb 2 oz (115.7 kg)   Wt Readings from Last 3 Encounters:  08/26/23 255 lb 2 oz (115.7 kg)  08/18/23 256 lb 9.6 oz (116.4 kg)  08/06/23 254 lb (115.2 kg)   Lab Results  Component Value Date   CREATININE 1.71 (H) 08/26/2023   CREATININE 1.60 (H) 08/18/2023   CREATININE 1.46 (H) 08/17/2023   PHYSICAL EXAM:  General:  Well appearing. No resp difficulty HEENT: normal Neck: supple. JVP flat. No lymphadenopathy or thryomegaly appreciated. Cor: PMI normal. Regular rhythm, tachycardia No rubs, gallops or murmurs. Lungs: clear Abdomen: soft, nontender, nondistended. No hepatosplenomegaly. No bruits or masses.  Extremities: no cyanosis, clubbing, rash, 3+ pitting edema bilateral lower legs Neuro: alert & oriented x3, cranial nerves grossly intact. Moves all 4 extremities w/o difficulty. Affect pleasant.   ECG: not done  ReDs: 50% although question accuracy as he's broken his chest in the past so is mis-shapen in the chest   ASSESSMENT & PLAN:  1: NICM with reduced ejection fraction- - suspect due to HTN/ atrial fibrillation - NYHA class III - minimally fluid elevated with elevated ReDs reading & continued symptoms - will get proBNP to see  if it correlates with elevated ReDs or if the ReDs is falsely elevated - will call patient w/ results as he may need to increase furosemide or metolazone - weighing daily; reminded to call for  overnight weight gain of > 2 pounds or a weekly weight gain of > 5 pounds - weight up 1.2 pounds from last visit here 1 month - Echo 09/20/20: EF 55-60% with 3+ TR - Echo 07/03/23: EF 30-35% with moderate Tyler - continue furosemide 40mg  daily; if he experiences above weight gain on a weekend/ holiday, he can take an extra furosemide and then contact us on the first business day - continue [place] 10mg  daily - continue furosemide 60mg  daily - continue metolazone 2.5mg  weekly on Mondays - continue metoprolol succinate 50mg  daily - continue entresto 24/26mg  BID - previous spironolactone use caused dizziness - not a candidate for SGLT2 due to chronic foley - BNP 08/13/23 was 1904.9  2: HTN- - BP 150/75; says that he's taken "some" meds already but can't say which ones but plans to take the rest upon his return home - saw PCP (McClanahan) 11/23 - BMP 08/18/23 showed sodium 136, potassium 4.1 creatinine 1.6 & GFR 44 - BMET today  3: Atrial fibrillation- - saw [facility] 06/24; to get established with Dr. Darrold Junker - ablation 09/07/20 - continue ASA 81mg  - eliquis caused dizziness & vision changes  4: DM- - A1c 07/02/23 was 7.2% - saw podiatry last week - continue glipizide 10mg  BID - continue metformin 1000mg  BID  5: Mobitz type 2 AV block- - DDD MDT PPM placed 09/15/20  - <0.1% time in AT/AF - atrial impedance 437 ohms - RV impedance 532 ohms  6: Lymphedema- - stage 2 - limited in his ability to exercise due to edema - tried compression socks in the past but they became too tight and cut into his legs - edema persists even with elevation in the bed overnight - has upcoming wound center appointment next week - may benefit from compression pumps   Return in 1 week, sooner if  needed. Emphasized the importance of bring medication bottles to every visit.

## 2023-08-27 LAB — MISC LABCORP TEST (SEND OUT): Labcorp test code: 143000

## 2023-08-28 ENCOUNTER — Telehealth: Payer: Self-pay | Admitting: Family

## 2023-08-28 NOTE — Progress Notes (Unsigned)
Called and spoke with pt. Pt states he recently started [facility] and has been having dizziness and queeziness due to his BPs running in the 110s. Pt states that has always been too low for him and caused the same sx in the past. Ongoing sx for a few days.

## 2023-09-02 ENCOUNTER — Telehealth: Payer: Self-pay | Admitting: Family

## 2023-09-02 NOTE — Telephone Encounter (Signed)
Pt confirmed appt for 09/03/23

## 2023-09-03 ENCOUNTER — Other Ambulatory Visit: Payer: Self-pay | Admitting: Family

## 2023-09-03 ENCOUNTER — Encounter: Payer: Self-pay | Admitting: Family

## 2023-09-03 ENCOUNTER — Ambulatory Visit
Admission: RE | Admit: 2023-09-03 | Discharge: 2023-09-03 | Disposition: A | Discharge status: Home / Self Care | Payer: BC Managed Care – PPO | Source: Ambulatory Visit | Attending: Family

## 2023-09-03 ENCOUNTER — Ambulatory Visit (HOSPITAL_BASED_OUTPATIENT_CLINIC_OR_DEPARTMENT_OTHER): Payer: BC Managed Care – PPO | Admitting: Family

## 2023-09-03 VITALS — BP 146/78 | HR 114 | Ht 72.0 in | Wt 257.0 lb

## 2023-09-03 DIAGNOSIS — M069 Rheumatoid arthritis, unspecified: Secondary | ICD-10-CM | POA: Insufficient documentation

## 2023-09-03 DIAGNOSIS — D631 Anemia in chronic kidney disease: Secondary | ICD-10-CM | POA: Insufficient documentation

## 2023-09-03 DIAGNOSIS — I5022 Chronic systolic (congestive) heart failure: Secondary | ICD-10-CM | POA: Diagnosis not present

## 2023-09-03 DIAGNOSIS — E1122 Type 2 diabetes mellitus with diabetic chronic kidney disease: Secondary | ICD-10-CM | POA: Diagnosis not present

## 2023-09-03 DIAGNOSIS — N189 Chronic kidney disease, unspecified: Secondary | ICD-10-CM | POA: Insufficient documentation

## 2023-09-03 DIAGNOSIS — I428 Other cardiomyopathies: Secondary | ICD-10-CM | POA: Insufficient documentation

## 2023-09-03 DIAGNOSIS — E785 Hyperlipidemia, unspecified: Secondary | ICD-10-CM | POA: Insufficient documentation

## 2023-09-03 DIAGNOSIS — I890 Lymphedema, not elsewhere classified: Secondary | ICD-10-CM | POA: Insufficient documentation

## 2023-09-03 DIAGNOSIS — L97519 Non-pressure chronic ulcer of other part of right foot with unspecified severity: Secondary | ICD-10-CM | POA: Insufficient documentation

## 2023-09-03 DIAGNOSIS — I441 Atrioventricular block, second degree: Secondary | ICD-10-CM | POA: Insufficient documentation

## 2023-09-03 DIAGNOSIS — I130 Hypertensive heart and chronic kidney disease with heart failure and stage 1 through stage 4 chronic kidney disease, or unspecified chronic kidney disease: Secondary | ICD-10-CM | POA: Insufficient documentation

## 2023-09-03 DIAGNOSIS — I10 Essential (primary) hypertension: Secondary | ICD-10-CM | POA: Diagnosis not present

## 2023-09-03 DIAGNOSIS — E11621 Type 2 diabetes mellitus with foot ulcer: Secondary | ICD-10-CM | POA: Insufficient documentation

## 2023-09-03 DIAGNOSIS — E1151 Type 2 diabetes mellitus with diabetic peripheral angiopathy without gangrene: Secondary | ICD-10-CM | POA: Insufficient documentation

## 2023-09-03 DIAGNOSIS — L97529 Non-pressure chronic ulcer of other part of left foot with unspecified severity: Secondary | ICD-10-CM | POA: Insufficient documentation

## 2023-09-03 DIAGNOSIS — I4819 Other persistent atrial fibrillation: Secondary | ICD-10-CM | POA: Diagnosis not present

## 2023-09-03 DIAGNOSIS — Z7982 Long term (current) use of aspirin: Secondary | ICD-10-CM | POA: Insufficient documentation

## 2023-09-03 DIAGNOSIS — N1831 Chronic kidney disease, stage 3a: Secondary | ICD-10-CM

## 2023-09-03 DIAGNOSIS — I2510 Atherosclerotic heart disease of native coronary artery without angina pectoris: Secondary | ICD-10-CM | POA: Insufficient documentation

## 2023-09-03 LAB — BASIC METABOLIC PANEL
Anion gap: 8 (ref 5–15)
BUN: 53 mg/dL — ABNORMAL HIGH (ref 8–23)
CO2: 24 mmol/L (ref 22–32)
Calcium: 8.8 mg/dL — ABNORMAL LOW (ref 8.9–10.3)
Chloride: 103 mmol/L (ref 98–111)
Creatinine, Ser: 1.56 mg/dL — ABNORMAL HIGH (ref 0.61–1.24)
GFR, Estimated: 45 mL/min — ABNORMAL LOW (ref >60)
Glucose, Bld: 224 mg/dL — ABNORMAL HIGH (ref 70–99)
Potassium: 4.1 mmol/L (ref 3.5–5.1)
Sodium: 135 mmol/L (ref 135–145)

## 2023-09-03 LAB — BRAIN NATRIURETIC PEPTIDE: B Natriuretic Peptide: 1961.5 pg/mL — ABNORMAL HIGH (ref 0.0–100.0)

## 2023-09-03 MED ORDER — POTASSIUM CHLORIDE CRYS ER 20 MEQ PO TBCR
EXTENDED_RELEASE_TABLET | ORAL | Status: AC
  Filled 2023-09-03: qty 2

## 2023-09-03 MED ORDER — POTASSIUM CHLORIDE CRYS ER 20 MEQ PO TBCR
40.0000 meq | EXTENDED_RELEASE_TABLET | Freq: Once | ORAL | Status: AC
Start: 2023-09-03 — End: 2023-09-03
  Administered 2023-09-03: 40 meq via ORAL

## 2023-09-03 MED ORDER — FUROSEMIDE 10 MG/ML IJ SOLN
80.0000 mg | Freq: Once | INTRAMUSCULAR | Status: AC
Start: 2023-09-03 — End: 2023-09-03
  Administered 2023-09-03: 80 mg via INTRAVENOUS

## 2023-09-03 MED ORDER — FUROSEMIDE 10 MG/ML IJ SOLN
INTRAMUSCULAR | Status: AC
  Filled 2023-09-03: qty 8

## 2023-09-03 NOTE — Progress Notes (Signed)
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   Cardiac history dates back to 06/21 when he was first diagnosed with HF. Had also recently been diagnosed with atrial fibrillation.   Admitted 10/30/22 due to infection of right third toe. Noted his right third toe turning dark few days ago and complained of pain in the affected area as well as fever. Right foot x-ray showed soft tissue edema overlies the dorsum of the foot. No soft tissue gas or radiopaque foreign body. No radiographic findings of osteomyelitis. IV antibiotics given. ABI abn. Vascular consulted. RLE angiography done. Amputation  of R 3rd toe done. Post-op had significant epistaxis L nare requiring insertion rhino rocket packing overnight, holding heparin/ASA. Restarted at discharge. Admitted 07/02/23 due to shortness of breath and worsening leg edema. Chronic diabetic foot ulcers. Found to have cellulitis involving the right lower extremity along with an elevated BNP of 1066. Patient was seen by podiatrist and underwent bilateral lower extremity imaging however they did not show any evidence of underlying osteomyelitis. Cardiology consulted. IV diuresed with transition to oral diuretics. Was in the ED 07/19/23 due to mechanical fall where he slipped and hit his knees on the cabinet. No head injury or LOC.    Was in the ED 08/03/23 due to shortness of breath and worsening pedal edema. CXR  with pulmonary edema. No STEMI. Lasix provided. Admitted 08/13/23 due to acute  worsening of his SOB with associated orthopnea and PND. Cardiology consulted. IV lasix given and then transitioned to an increased oral lasix dose. Troponins 100's flat.   Echo 09/20/20: EF 55-60% with 3+ TR Echo 07/03/23: EF 30-35% with moderate MR  He presents today for a follow-up HF visit with a chief complaint of moderate shortness of breath with minimal exertion. Has associated fatigue, nasal congestion (worsening), fatigue, nose bleeds and pedal edema (improving) along with this. He has noticed that he feels more short of breath today and he's unsure of why. Denies chest pain, cough, palpitations, abdominal distention or dizziness.   Stopped taking entresto 2 days ago and says that he feels better since he stopped it. Said that he looked up on the internet that his metoprolol was a blood thinner and feels like he's having more nose bleeds since he looked this up. Explained that metoprolol was not a blood thinner and that his nose bleeds could be because of the dry air.   Going to the wound center later this week.   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 Does not add salt to his food.   At last visit, metolazone was increased to M, W, F last week and then starting this week, it will be M & F. He says that sometimes he urinates a lot after he takes it but other times, not so much. Has been taking his metolazone and furosemide at the same time.   ROS: All systems negative except as listed in HPI, PMH and Problem List.  SH:  Social History   Socioeconomic History   Marital status: Single    Spouse name: Not on file  Number of children: Not on file   Years of education: Not on file   Highest education level: Not on file  Occupational History    Comment: [employer]  Tobacco Use   Smoking status: Never   Smokeless tobacco: Never  Vaping Use   Vaping status: Never Used  Substance and Sexual Activity   Alcohol use: Not Currently    Comment: rarely   Drug use: Never   Sexual  activity: Not Currently    Birth control/protection: None  Other Topics Concern   Not on file  Social History Narrative   Lives with roommate   Social Determinants of Health   Financial Resource Strain: Low Risk  (03/20/2023)   Received from [facility], [facility]   Overall Financial Resource Strain (CARDIA)    Difficulty of Paying Living Expenses: Not hard at all  Food Insecurity: No Food Insecurity (08/14/2023)   Hunger Vital Sign    Worried About Running Out of Food in the Last Year: Never true    Ran Out of Food in the Last Year: Never true  Transportation Needs: No Transportation Needs (08/14/2023)   PRAPARE - Administrator, Civil Service (Medical): No    Lack of Transportation (Non-Medical): No  Physical Activity: Not on file  Stress: No Stress Concern Present (09/07/2020)   Received from [facility], [facility] of Occupational Health - Occupational Stress Questionnaire    Feeling of Stress : Not at all  Social Connections: Unknown (02/01/2022)   Received from [facility], [facility]   Social Network    Social Network: Not on file  Intimate Partner Violence: Not At Risk (08/14/2023)   Humiliation, Afraid, Rape, and Kick questionnaire    Fear of Current or Ex-Partner: No    Emotionally Abused: No    Physically Abused: No    Sexually Abused: No    FH:  Family History  Problem Relation Age of Onset   Cancer Niece     Past Medical History:  Diagnosis Date   (HFpEF) heart failure with preserved ejection fraction (HCC) 03/01/2020   a.) TTE 03/01/2020: EF 55-60%, mod MAC, mod AoV sclerosis, triv AR, mild TR, mod MR, RVSP 50-59; b.) TTE 09/10/2020: EF 55-60%, mod MAC, mod AoV sclerosis, mild TR, 3+ MR, RVSP 50-59; c.) TTE 09/26/2020: EF 55-60%, mild LA dil, triv PR, mild MR/TR, RVSP 37-49   Adenoma of left adrenal gland    Anemia    Arthritis    Atrial fibrillation and flutter (HCC)    a.) CHA2DS2VASc = 5 (age x2, HFpEF,  HTN, T2DM);  b.) s/p CTI ablation 09/07/2020; c.) rate/rhythm maintained on oral carvedilol; not on chronic anticoagulation therapy   CAD (coronary artery disease)    Cardiomegaly    CKD (chronic kidney disease), stage III (HCC)    DOE (dyspnea on exertion)    Drug-induced bradycardia    Gangrene of toe of left foot (HCC)    a.) 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    HLD (hyperlipidemia)    Hypertension    Long term current use of aspirin    Lymphedema of both lower extremities    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)    a.) s/p amputation 11/04/2022   Peripheral vascular disease (HCC)    Pleural effusion on right 09/09/2020   a.) s/p RIGHT thoracentesis with 2180 cc yield  Pneumonia    Presence of permanent cardiac pacemaker 09/10/2020   a.) TVP placement 09/10/2020 due to intermittent CHB in setting of urosepsis; b.) s/p PPM placement 09/15/2020: MDT Azure XT DR (SN: [id number])   Pulmonary hypertension (HCC) 03/01/2020   a.) TTE: 03/01/2020: RVSP 50-59; b.) TTE 09/10/2020: RVSP 50-59; c.) TTE 09/26/2020: RVSP 37-49   RA (rheumatoid arthritis) (HCC)    Rheumatic fever    Sepsis (HCC) 09/10/2020   a.) urosepsis --> BC x 2 sets and UC all grew out significant Proteus mirabilis; admitted to [facility] 09/07/2020 - 09/27/2020.   Sick sinus syndrome [facility])    a.) s/p MDT PPM placement 09/15/2020   T2DM (type 2 diabetes mellitus) (HCC)    Urinary retention    chronic, with indwelling Foley catheter and plans for a suprapubic   Wears dentures    full upper    Current Outpatient Medications  Medication Sig Dispense Refill   acetaminophen (TYLENOL) 500 MG tablet Take 2 tablets (1,000 mg total) by mouth 3 (three) times daily as needed (for pain).     aspirin EC 81 MG tablet Take 1 tablet (81 mg total) by mouth daily. Swallow whole. 30 tablet 0   Finerenone (KERENDIA) 10 MG TABS Take 1 tablet  (10 mg total) by mouth daily. 30 tablet 2   furosemide (LASIX) 20 MG tablet Take 3 tablets (60 mg total) by mouth daily. 90 tablet 2   gentamicin cream (GARAMYCIN) 0.1 % Apply 1 Application topically 2 (two) times daily. 30 g 1   glipiZIDE (GLUCOTROL) 10 MG tablet Take 1 tablet (10 mg total) by mouth 2 (two) times daily. 60 tablet 2   metFORMIN (GLUCOPHAGE) 1000 MG tablet Take 1 tablet (1,000 mg total) by mouth 2 (two) times daily. 60 tablet 2   metolazone (ZAROXOLYN) 2.5 MG tablet Take 1 tablet (2.5 mg total) by mouth once a week. On Mondays 4 tablet 2   metoprolol succinate (TOPROL-XL) 50 MG 24 hr tablet Take 1 tablet (50 mg total) by mouth daily. Take with or immediately following a meal. 30 tablet 2   Multiple Vitamins-Minerals (MULTIVITAMIN WITH MINERALS) tablet Take 1 tablet by mouth daily.     OVER THE COUNTER MEDICATION 1 Scoop daily. Super Beets Powder     sacubitril-valsartan (ENTRESTO) 24-26 MG Take 1 tablet by mouth 2 (two) times daily. 60 tablet 2   silver sulfADIAZINE (SILVADENE) 1 % cream Apply to affected area daily 50 g 1   No current facility-administered medications for this visit.   Vitals:   09/03/23 0957  BP: (!) 146/78  Pulse: (!) 114  SpO2: 99%  Weight: 257 lb (116.6 kg)  Height: 6' (1.829 m)   Wt Readings from Last 3 Encounters:  09/03/23 257 lb (116.6 kg)  08/26/23 255 lb 2 oz (115.7 kg)  08/18/23 256 lb 9.6 oz (116.4 kg)   Lab Results  Component Value Date   CREATININE 1.56 (H) 09/03/2023   CREATININE 1.71 (H) 08/26/2023   CREATININE 1.60 (H) 08/18/2023   PHYSICAL EXAM:  General:  Well appearing. No resp difficulty HEENT: normal Neck: supple. JVP flat. No lymphadenopathy or thryomegaly appreciated. Cor: PMI normal. Regular rhythm, tachycardia No rubs, gallops or murmurs. Lungs: clear Abdomen: soft, nontender, nondistended. No hepatosplenomegaly. No bruits or masses.  Extremities: no cyanosis, clubbing, rash, 3+ pitting edema bilateral lower  legs Neuro: alert & oriented x3, cranial nerves grossly intact. Moves all 4 extremities w/o difficulty. Affect pleasant.   ECG: not done  ReDs:  48%; last week it was 50%    ASSESSMENT & PLAN:  1: NICM with reduced ejection fraction- - suspect due to HTN/ atrial fibrillation - NYHA class III/ IV (SOB when talking) - fluid overload with elevated ReDs reading & continued symptoms - weighing daily; reminded to call for overnight weight gain of > 2 pounds or a weekly weight gain of > 5 pounds - weight up 2 pounds from last visit here 1 week ago - ReDs 48% - will send of 80mg  IV lasix/ PO potassium today - BMET/ BNP today - may need to repeat IV lasix again later this week - Echo 09/20/20: EF 55-60% with 3+ TR - Echo 07/03/23: EF 30-35% with moderate MR - continue furosemide 40mg  daily - continue [place]rend[place] 10mg  daily - continue furosemide 60mg  daily - continue metolazone 2.5mg  weekly on Monday & Friday - continue metoprolol succinate 50mg  daily - patient stopped entresto due to dizziness - previous spironolactone use caused dizziness - not a candidate for SGLT2 due to chronic foley - BNP 08/13/23 was 1904.9  2: HTN- - BP 146/78 - saw PCP (McClanahan) 11/23 - BMP 08/26/23 showed sodium 135, potassium 4.6 creatinine 1.71 & GFR 40 - BMET today  3: Atrial fibrillation- - [facility] 06/24; to get established with Dr. Darrold Junker, his office number was provided today and emphasized that he call to get an appt scheduled - ablation 09/07/20 - continue ASA 81mg  - eliquis caused dizziness & vision changes  4: DM- - A1c 07/02/23 was 7.2% - continue glipizide 10mg  BID - continue metformin 1000mg  BID  5: Mobitz type 2 AV block- - DDD MDT PPM placed 09/15/20  - <0.1% time in AT/AF - atrial impedance 437 ohms - RV impedance 532 ohms  6: Lymphedema- - stage 2 - limited in his ability to exercise due to edema - tried compression socks in the past but they became  too tight and cut into his legs - edema persists even with elevation in the bed overnight - has upcoming wound center appointment next week - may benefit from compression pumps   Return in 1 week, sooner if needed

## 2023-09-03 NOTE — Patient Instructions (Signed)
Please call [facility] cardiology at [phone] to schedule a follow-up appointment.

## 2023-09-06 ENCOUNTER — Encounter: Payer: BC Managed Care – PPO | Attending: Physician Assistant | Admitting: Physician Assistant

## 2023-09-06 ENCOUNTER — Ambulatory Visit
Admission: RE | Admit: 2023-09-06 | Discharge: 2023-09-06 | Disposition: A | Discharge status: Home / Self Care | Payer: BC Managed Care – PPO | Source: Ambulatory Visit | Attending: Family | Admitting: Family

## 2023-09-06 DIAGNOSIS — E1122 Type 2 diabetes mellitus with diabetic chronic kidney disease: Secondary | ICD-10-CM | POA: Insufficient documentation

## 2023-09-06 DIAGNOSIS — I455 Other specified heart block: Secondary | ICD-10-CM | POA: Diagnosis not present

## 2023-09-06 DIAGNOSIS — I130 Hypertensive heart and chronic kidney disease with heart failure and stage 1 through stage 4 chronic kidney disease, or unspecified chronic kidney disease: Secondary | ICD-10-CM | POA: Insufficient documentation

## 2023-09-06 DIAGNOSIS — I5022 Chronic systolic (congestive) heart failure: Secondary | ICD-10-CM | POA: Insufficient documentation

## 2023-09-06 DIAGNOSIS — Z7982 Long term (current) use of aspirin: Secondary | ICD-10-CM | POA: Diagnosis not present

## 2023-09-06 DIAGNOSIS — E11621 Type 2 diabetes mellitus with foot ulcer: Secondary | ICD-10-CM | POA: Insufficient documentation

## 2023-09-06 DIAGNOSIS — I5042 Chronic combined systolic (congestive) and diastolic (congestive) heart failure: Secondary | ICD-10-CM | POA: Insufficient documentation

## 2023-09-06 DIAGNOSIS — L97522 Non-pressure chronic ulcer of other part of left foot with fat layer exposed: Secondary | ICD-10-CM | POA: Diagnosis not present

## 2023-09-06 DIAGNOSIS — N183 Chronic kidney disease, stage 3 unspecified: Secondary | ICD-10-CM | POA: Insufficient documentation

## 2023-09-06 DIAGNOSIS — I480 Paroxysmal atrial fibrillation: Secondary | ICD-10-CM | POA: Diagnosis not present

## 2023-09-06 DIAGNOSIS — I890 Lymphedema, not elsewhere classified: Secondary | ICD-10-CM | POA: Insufficient documentation

## 2023-09-06 DIAGNOSIS — L97512 Non-pressure chronic ulcer of other part of right foot with fat layer exposed: Secondary | ICD-10-CM | POA: Insufficient documentation

## 2023-09-06 LAB — BASIC METABOLIC PANEL
Anion gap: 9 (ref 5–15)
BUN: 50 mg/dL — ABNORMAL HIGH (ref 8–23)
CO2: 21 mmol/L — ABNORMAL LOW (ref 22–32)
Calcium: 8.5 mg/dL — ABNORMAL LOW (ref 8.9–10.3)
Chloride: 106 mmol/L (ref 98–111)
Creatinine, Ser: 1.54 mg/dL — ABNORMAL HIGH (ref 0.61–1.24)
GFR, Estimated: 46 mL/min — ABNORMAL LOW (ref >60)
Glucose, Bld: 271 mg/dL — ABNORMAL HIGH (ref 70–99)
Potassium: 4.1 mmol/L (ref 3.5–5.1)
Sodium: 136 mmol/L (ref 135–145)

## 2023-09-06 LAB — BRAIN NATRIURETIC PEPTIDE: B Natriuretic Peptide: 1739.5 pg/mL — ABNORMAL HIGH (ref 0.0–100.0)

## 2023-09-06 MED ORDER — POTASSIUM CHLORIDE CRYS ER 20 MEQ PO TBCR
EXTENDED_RELEASE_TABLET | ORAL | Status: AC
  Filled 2023-09-06: qty 2

## 2023-09-06 MED ORDER — POTASSIUM CHLORIDE CRYS ER 20 MEQ PO TBCR
40.0000 meq | EXTENDED_RELEASE_TABLET | Freq: Once | ORAL | Status: AC
  Administered 2023-09-06: 40 meq via ORAL

## 2023-09-06 MED ORDER — FUROSEMIDE 10 MG/ML IJ SOLN
80.0000 mg | Freq: Once | INTRAMUSCULAR | Status: AC
Start: 2023-09-06 — End: 2023-09-06
  Administered 2023-09-06: 80 mg via INTRAVENOUS

## 2023-09-06 MED ORDER — FUROSEMIDE 10 MG/ML IJ SOLN
INTRAMUSCULAR | Status: AC
  Filled 2023-09-06: qty 8

## 2023-09-09 DIAGNOSIS — E11621 Type 2 diabetes mellitus with foot ulcer: Secondary | ICD-10-CM | POA: Diagnosis not present

## 2023-09-09 NOTE — Progress Notes (Signed)
MACSEN, FEIERTAG ([id number]) [phone] [id number] Page 1 of 5 Visit Report for 09/06/2023 Abuse Risk Screen Details Patient Name: Date of Service: Tyler [place] A. 09/06/2023 8:45 A M Medical Record Number: [id number] Patient Account Number: [id number] Date of Birth/Sex: Treating RN: 07-Jul-1946 (77 y.o. Tyler [name]) Yevonne Pax Primary Care Jannely Henthorn: [facility] NA Fernande Boyden Other Clinician: Referring Sai Zinn: Treating Eliot Popper/Extender: Baxter Kail Weeks in Treatment: 0 Abuse Risk Screen Items Answer ABUSE RISK SCREEN: Has anyone close to you tried to hurt or harm you recentlyo No Do you feel uncomfortable with anyone in your familyo No Has anyone forced you do things that you didnt want to doo No Electronic Signature(s) Signed: 09/09/2023 3:34:19 PM By: Yevonne Pax RN Entered By: Yevonne Pax on 09/06/2023 08:54:53 -------------------------------------------------------------------------------- Activities of Daily Living Details Patient Name: Date of Service: Tyler Tyler [name] [place] A. 09/06/2023 8:45 A M Medical Record Number: [id number] Patient Account Number: [id number] Date of Birth/Sex: Treating RN: 1946/03/08 (77 y.o. Tyler [name]) Yevonne Pax Primary Care Calder Oblinger: [facility] NA Fernande Boyden Other Clinician: Referring Parley Pidcock: Treating Akira Adelsberger/Extender: Baxter Kail Weeks in Treatment: 0 Activities of Daily Living Items Answer Activities of Daily Living (Please select one for each item) Drive Automobile Completely Able T Medications ake Completely Able Use T elephone Completely Able Care for Appearance Completely Able Use T oilet Completely Able Bath / Shower Completely Able Dress Self Completely Able Feed Self Completely Able Walk Completely Able Get In / Out Bed Completely Able Housework Completely KILLIAN, DOUBEK ([id number]) [phone] [id number] Page 2 of 5 Prepare Meals Completely Able Handle Money Completely  Able Shop for Self Completely Able Electronic Signature(s) Signed: 09/09/2023 3:34:19 PM By: Yevonne Pax RN Entered By: Yevonne Pax on 09/06/2023 08:55:13 -------------------------------------------------------------------------------- Education Screening Details Patient Name: Date of Service: Tyler [name], Tyler [name] A. 09/06/2023 8:45 A M Medical Record Number: [id number] Patient Account Number: [id number] Date of Birth/Sex: Treating RN: 1946-06-22 (77 y.o. Tyler [name]) Yevonne Pax Primary Care Kaylee Trivett: [facility] NA Fernande Boyden Other Clinician: Referring Lashon Hillier: Treating Zawadi Aplin/Extender: Florentina Jenny in Treatment: 0 Primary Learner Assessed: Patient Learning Preferences/Education Level/Primary Language Learning Preference: Explanation Highest Education Level: High School Preferred Language: English Cognitive Barrier Language Barrier: No Translator Needed: No Memory Deficit: No Emotional Barrier: No Cultural/Religious Beliefs Affecting Medical Care: No Physical Barrier Impaired Vision: Yes Glasses Impaired Hearing: No Decreased Hand dexterity: No Knowledge/Comprehension Knowledge Level: Medium Comprehension Level: High Ability to understand written instructions: High Ability to understand verbal instructions: High Motivation Anxiety Level: Anxious Cooperation: Cooperative Education Importance: Acknowledges Need Interest in Health Problems: Asks Questions Perception: Coherent Willingness to Engage in Self-Management High Activities: Readiness to Engage in Self-Management High Activities: Electronic Signature(s) Signed: 09/09/2023 3:34:19 PM By: Yevonne Pax RN Entered By: Yevonne Pax on 09/06/2023 08:55:45 Trish Fountain ([id number]) [id number] Page 3 of 5 -------------------------------------------------------------------------------- Fall Risk Assessment Details Patient Name: Date of Service: Tyler [name] [place] A. 09/06/2023  8:45 A M Medical Record Number: [id number] Patient Account Number: [id number] Date of Birth/Sex: Treating RN: Oct 11, 1945 (77 y.o. Tyler [name]) Yevonne Pax Primary Care Trafton Roker: [facility] NA Fernande Boyden Other Clinician: Referring Ashliegh Parekh: Treating Kamani Lewter/Extender: Florentina Jenny in Treatment: 0 Fall Risk Assessment Items Have you had 2 or more falls in the last 12 monthso 0 No Have you had any fall that resulted in injury in the last 12 monthso 0 No FALLS RISK SCREEN History of falling - immediate or within 3 months 0 No  Secondary diagnosis (Do you have 2 or more medical diagnoseso) 0 No Ambulatory aid None/bed rest/wheelchair/nurse 0 Yes Crutches/cane/walker 0 No Furniture 0 No Intravenous therapy Access/Saline/Heparin Lock 0 No Gait/Transferring Normal/ bed rest/ wheelchair 0 Yes Weak (short steps with or without shuffle, stooped but able to lift head while walking, may seek 0 No support from furniture) Impaired (short steps with shuffle, may have difficulty arising from chair, head down, impaired 0 No balance) Mental Status Oriented to own ability 0 Yes Electronic Signature(s) Signed: 09/09/2023 3:34:19 PM By: Yevonne Pax RN Entered By: Yevonne Pax on 09/06/2023 08:56:08 -------------------------------------------------------------------------------- Foot Assessment Details Patient Name: Date of Service: Tyler [name] [name] A. 09/06/2023 8:45 A M Medical Record Number: [id number] Patient Account Number: [id number] Date of Birth/Sex: Treating RN: [birth year] (77 y.o. 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 09/06/2023 8:45 A M Medical Record Number: [id number] Patient Account Number: [id number] Date of Birth/Sex: Treating RN: 1945-10-03 (77 y.o. Tyler [name]) Yevonne Pax Primary Care Thressa Shiffer: [facility] NA Fernande Boyden Other Clinician: Referring Jakyria Bleau: Treating Alaysia Lightle/Extender: Baxter Kail Weeks in Treatment: 0 Height (in): 78 Weight (lbs): 250 Body Mass Index (BMI): 28.9 Nutrition Risk Screening Items Score Screening NUTRITION RISK SCREEN: I have an illness or condition that made me change the kind and/or amount of food I eat 0 No I eat fewer than two meals per day 0 No I eat few fruits and vegetables, or milk products 0 No I have three or more drinks of beer, liquor or wine almost every day 0 No I have tooth or mouth problems that make it hard for me to eat 0 No TYANTHONY, SATHRE A ([id number]) [phone] [id number] Page 5 of 5 I don't always have enough money to buy the food I need 0 No I eat alone most of the time 0 No I take three or more different prescribed or over-the-counter drugs a day 1 Yes Without wanting to, I have lost or gained 10 pounds in the last six months 0 No I am not always physically able to shop, cook and/or feed myself 0 No Nutrition Protocols Good Risk Protocol 0 No interventions needed Moderate Risk Protocol High Risk Proctocol Risk Level: Good Risk Score: 1 Electronic  Signature(s) Signed: 09/09/2023 3:34:19 PM By: Yevonne Pax RN Entered By: Yevonne Pax on 09/06/2023 08:56:23

## 2023-09-09 NOTE — Progress Notes (Signed)
SHATEEK, TILLAR ([id number]) [id number] Page 1 of 13 Visit Report for 09/06/2023 Allergy List Details Patient Name: Date of Service: [place] A. 09/06/2023 8:45 A M Medical Record Number: [id number] Patient Account Number: [id number] Date of Birth/Sex: Treating RN: Apr 13, 1946 (77 y.o. Tyler [name]) Yevonne Pax Primary Care Madison Direnzo: [facility] NA Fernande Boyden Other Clinician: Referring Javonna Balli: Treating Copelan Maultsby/Extender: Baxter Kail Weeks in Treatment: 0 Allergies Active Allergies Eliquis spironolactone Allergy Notes Electronic Signature(s) Signed: 09/09/2023 3:34:19 PM By: Yevonne Pax RN Entered By: Yevonne Pax on 09/06/2023 09:21:49 -------------------------------------------------------------------------------- Arrival Information Details Patient Name: Date of Service: Tyler [name], Tyler [name] HN A. 09/06/2023 8:45 A M Medical Record Number: [id number] Patient Account Number: [id number] Date of Birth/Sex: Treating RN: Nov 08, 1945 (77 y.o. Tyler [name]) Yevonne Pax Primary Care Olie Scaffidi: [facility] NA Fernande Boyden Other Clinician: Referring Camilia Caywood: Treating Kiala Faraj/Extender: Florentina Jenny in Treatment: 0 Visit Information Patient Arrived: Wheel Chair Arrival Time: 08:49 Accompanied By: self Transfer Assistance: None Patient Identification Verified: Yes Secondary Verification Process Completed: Yes Patient Requires Transmission-Based Precautions: No Patient Has Alerts: No Electronic Signature(s) Signed: 09/09/2023 3:34:19 PM By: Yevonne Pax RN Trish Fountain ([id number]) [id number] Page 2 of 13 Entered By: Yevonne Pax on 09/06/2023 08:49:30 -------------------------------------------------------------------------------- Clinic Level of Care Assessment Details Patient Name: Date of Service: [place] A. 09/06/2023 8:45 A M Medical Record Number: [id number] Patient Account Number: [id number] Date of Birth/Sex:  Treating RN: 06/10/1946 (77 y.o. Tyler [name]) Yevonne Pax Primary Care Elouise Divelbiss: [facility] NA Fernande Boyden Other Clinician: Referring Alizon Schmeling: Treating Kerrie Timm/Extender: Florentina Jenny in Treatment: 0 Clinic Level of Care Assessment Items TOOL 4 Quantity Score []  - 0 Use when only an EandM is performed on FOLLOW-UP visit ASSESSMENTS - Nursing Assessment / Reassessment []  - 0 Reassessment of Co-morbidities (includes updates in patient status) []  - 0 Reassessment of Adherence to Treatment Plan ASSESSMENTS - Wound and Skin A ssessment / Reassessment []  - 0 Simple Wound Assessment / Reassessment - one wound []  - 0 Complex Wound Assessment / Reassessment - multiple wounds []  - 0 Dermatologic / Skin Assessment (not related to wound area) ASSESSMENTS - Focused Assessment []  - 0 Circumferential Edema Measurements - multi extremities []  - 0 Nutritional Assessment / Counseling / Intervention []  - 0 Lower Extremity Assessment (monofilament, tuning fork, pulses) []  - 0 Peripheral Arterial Disease Assessment (using hand held doppler) ASSESSMENTS - Ostomy and/or Continence Assessment and Care []  - 0 Incontinence Assessment and Management []  - 0 Ostomy Care Assessment and Management (repouching, etc.) PROCESS - Coordination of Care []  - 0 Simple Patient / Family Education for ongoing care []  - 0 Complex (extensive) Patient / Family Education for ongoing care []  - 0 Staff obtains Chiropractor, Records, T Results / Process Orders est []  - 0 Staff telephones HHA, Nursing Homes / Clarify orders / etc []  - 0 Routine Transfer to another Facility (non-emergent condition) []  - 0 Routine Hospital Admission (non-emergent condition) []  - 0 New Admissions / Manufacturing engineer / Ordering NPWT Apligraf, etc. , []  - 0 Emergency Hospital Admission (emergent condition) []  - 0 Simple Discharge Coordination []  - 0 Complex (extensive) Discharge Coordination PROCESS - Special Needs []  -  0 Pediatric / Minor Patient Management []  - 0 Isolation Patient Management Tyler, [name] ([id number]) [id number] Page 3 of 13 []  - 0 Hearing / Language / Visual special needs []  - 0 Assessment of Community assistance (transportation, D/C planning, etc.) []  - 0 Additional assistance / Altered mentation []  -  0 Support Surface(s) Assessment (bed, cushion, seat, etc.) INTERVENTIONS - Wound Cleansing / Measurement []  - 0 Simple Wound Cleansing - one wound []  - 0 Complex Wound Cleansing - multiple wounds []  - 0 Wound Imaging (photographs - any number of wounds) []  - 0 Wound Tracing (instead of photographs) []  - 0 Simple Wound Measurement - one wound []  - 0 Complex Wound Measurement - multiple wounds INTERVENTIONS - Wound Dressings []  - 0 Small Wound Dressing one or multiple wounds []  - 0 Medium Wound Dressing one or multiple wounds []  - 0 Large Wound Dressing one or multiple wounds []  - 0 Application of Medications - topical []  - 0 Application of Medications - injection INTERVENTIONS - Miscellaneous []  - 0 External ear exam []  - 0 Specimen Collection (cultures, biopsies, blood, body fluids, etc.) []  - 0 Specimen(s) / Culture(s) sent or taken to Lab for analysis []  - 0 Patient Transfer (multiple staff / Nurse, adult / Similar devices) []  - 0 Simple Staple / Suture removal (25 or less) []  - 0 Complex Staple / Suture removal (26 or more) []  - 0 Hypo / Hyperglycemic Management (close monitor of Blood Glucose) []  - 0 Ankle / Brachial Index (ABI) - do not check if billed separately []  - 0 Vital Signs Has the patient been seen at the hospital within the last three years: Yes Total Score: 0 Level Of Care: ____ Electronic Signature(s) Signed: 09/09/2023 3:34:19 PM By: Yevonne Pax RN Entered By: Yevonne Pax on 09/06/2023 09:57:50 -------------------------------------------------------------------------------- Encounter Discharge Information  Details Patient Name: Date of Service: Salena Saner HN A. 09/06/2023 8:45 A M Medical Record Number: [id number] Patient Account Number: [id number] Date of Birth/Sex: Treating RN: 07/05/1946 (77 y.o. Melonie [place] Primary Care Brittnae Aschenbrenner: [facility] NA Fernande Boyden Other Clinician: Referring Zareth Rippetoe: Treating Rehan Holness/Extender: Morene Antu ([id number]) [id number] Page 4 of 13 Weeks in Treatment: 0 Encounter Discharge Information Items Post Procedure Vitals Discharge Condition: Stable Temperature (F): 97.8 Ambulatory Status: Ambulatory Pulse (bpm): 107 Discharge Destination: Home Respiratory Rate (breaths/min): 18 Transportation: Private Auto Blood Pressure (mmHg): 109/53 Accompanied By: self Schedule Follow-up Appointment: Yes Clinical Summary of Care: Electronic Signature(s) Signed: 09/09/2023 3:34:19 PM By: Yevonne Pax RN Entered By: Yevonne Pax on 09/06/2023 09:59:00 -------------------------------------------------------------------------------- Lower Extremity Assessment Details Patient Name: Date of Service: [place] [facility] A. 09/06/2023 8:45 A M Medical Record Number: [id number] Patient Account Number: [id number] Date of Birth/Sex: Treating RN: 1946-08-04 (77 y.o. Tyler [name]) Yevonne Pax Primary Care Abu Heavin: [facility] NA Fernande Boyden Other Clinician: Referring Silvia Hightower: Treating Annabelle Rexroad/Extender: Baxter Kail Weeks in Treatment: 0 Edema Assessment Assessed: [Left: No] [Right: No] Edema: [Left: Yes] [Right: Yes] Calf Left: Right: Point of Measurement: 32 cm From Medial Instep 46 cm 40 cm Ankle Left: Right: Point of Measurement: 11 cm From Medial Instep 27 cm 26 cm Knee To Floor Left: Right: From Medial Instep 47 cm 47 cm Vascular Assessment Pulses: Dorsalis Pedis Palpable: [Left:Yes] [Right:Yes] Doppler Audible: [Left:Yes] [Right:Yes] Extremity colors, hair growth, and conditions: Extremity Color:  [Left:Hyperpigmented] [Right:Hyperpigmented] Hair Growth on Extremity: [Left:No] [Right:No] Temperature of Extremity: [Left:Warm] [Right:Warm] Capillary Refill: [Left:< 3 seconds] [Right:< 3 seconds] Dependent Rubor: [Left:Yes] [Right:Yes] Blanched when Elevated: [Left:No] [Right:No] Lipodermatosclerosis: [Left:Yes] [Right:Yes] Blood Pressure: Brachial: [Left:109] [Right:109] Ankle: [Left:Dorsalis Pedis: 70] [Right:Dorsalis Pedis: 100] Ankle Brachial Index: [Left:0.64] [Right:0.92 [id number] Page 5 of 13] Toe Nail Assessment Left: Right: Thick: Yes Yes Discolored: Yes Yes Deformed: Yes Yes Improper Length and Hygiene: Yes Yes Electronic Signature(s)  Signed: 09/09/2023 3:34:19 PM By: Yevonne Pax RN Entered By: Yevonne Pax on 09/06/2023 09:21:23 -------------------------------------------------------------------------------- Multi Wound Chart Details Patient Name: Date of Service: Tyler [name], Tyler [name] HN A. 09/06/2023 8:45 A M Medical Record Number: [id number] Patient Account Number: [id number] Date of Birth/Sex: Treating RN: Feb 16, 1946 (77 y.o. Tyler [name]) Yevonne Pax Primary Care Loyal Holzheimer: [facility] NA Fernande Boyden Other Clinician: Referring Hlee Fringer: Treating Mckinzy Fuller/Extender: Florentina Jenny in Treatment: 0 Vital Signs Height(in): 78 Pulse(bpm): 107 Weight(lbs): 250 Blood Pressure(mmHg): 109/53 Body Mass Index(BMI): 28.9 Temperature(F): 97.8 Respiratory Rate(breaths/min): 18 [1:Photos:] Right T Great oe Right Metatarsal head fifth Left Foot Wound Location: Gradually Appeared Gradually Appeared Gradually Appeared Wounding Event: Diabetic Wound/Ulcer of the Lower Diabetic Wound/Ulcer of the Lower Diabetic Wound/Ulcer of the Lower Primary Etiology: Extremity Extremity Extremity Lymphedema, Congestive Heart Lymphedema, Congestive Heart Lymphedema, Congestive Heart Comorbid History: Failure, Hypertension, Peripheral Failure, Hypertension,  Peripheral Failure, Hypertension, Peripheral Arterial Disease, Type II Diabetes Arterial Disease, Type II Diabetes Arterial Disease, Type II Diabetes 06/25/2023 06/25/2023 06/25/2023 Date Acquired: 0 0 0 Weeks of Treatment: Open Open Open Wound Status: No No No Wound Recurrence: 4x3.2x0.3 1.3x1.4x0.2 2.3x3x0.4 Measurements L x W x D (cm) 10.053 1.429 5.419 A (cm) : rea 3.016 0.286 2.168 Volume (cm) : Grade 2 Grade 2 Grade 2 Classification: Medium Medium Medium Exudate A mount: Serosanguineous Serosanguineous Serosanguineous Exudate Type: red, brown red, brown red, brown Exudate Color: Large (67-100%) Medium (34-66%) Medium (34-66%) Granulation A mount: Red Red Red Granulation Quality: Small (1-33%) Medium (34-66%) Medium (34-66%) Necrotic A mount: Fat Layer (Subcutaneous Tissue): Yes Fat Layer (Subcutaneous Tissue): Yes Fat Layer (Subcutaneous Tissue): Yes Exposed Structures: Fascia: No Fascia: No Fascia: No Tendon: No Tendon: No Tendon: No ANTRELL, ABRAHA ([id number]) [id number] Page 6 of 13 Muscle: No Muscle: No Muscle: No Joint: No Joint: No Joint: No Bone: No Bone: No Bone: No None None N/A Epithelialization: Treatment Notes Electronic Signature(s) Signed: 09/09/2023 3:34:19 PM By: Yevonne Pax RN Entered By: Yevonne Pax on 09/06/2023 09:22:55 -------------------------------------------------------------------------------- Multi-Disciplinary Care Plan Details Patient Name: Date of Service: Tyler [name], Tyler [name] HN A. 09/06/2023 8:45 A M Medical Record Number: [id number] Patient Account Number: [id number] Date of Birth/Sex: Treating RN: 30-May-1946 (77 y.o. Tyler [name]) Yevonne Pax Primary Care Shaunta Oncale: [facility] NA Fernande Boyden Other Clinician: Referring Giuseppina Quinones: Treating Miaa Latterell/Extender: Florentina Jenny in Treatment: 0 Active Inactive Necrotic Tissue Nursing Diagnoses: Knowledge deficit related to management of  necrotic/devitalized tissue Goals: Necrotic/devitalized tissue will be minimized in the wound bed Date Initiated: 09/06/2023 Target Resolution Date: 10/07/2023 Goal Status: Active Interventions: Assess patient pain level pre-, during and post procedure and prior to discharge Notes: Wound/Skin Impairment Nursing Diagnoses: Knowledge deficit related to ulceration/compromised skin integrity Goals: Patient/caregiver will verbalize understanding of skin care regimen Date Initiated: 09/06/2023 Target Resolution Date: 10/07/2023 Goal Status: Active Ulcer/skin breakdown will have a volume reduction of 30% by week 4 Date Initiated: 09/06/2023 Target Resolution Date: 10/07/2023 Goal Status: Active Ulcer/skin breakdown will have a volume reduction of 50% by week 8 Date Initiated: 09/06/2023 Target Resolution Date: 11/07/2023 Goal Status: Active Ulcer/skin breakdown will have a volume reduction of 80% by week 12 Date Initiated: 09/06/2023 Target Resolution Date: 12/05/2023 Goal Status: Active Ulcer/skin breakdown will heal within 14 weeks Date Initiated: 09/06/2023 Target Resolution Date: 01/05/2024 Goal Status: Active Interventions: FAIN, PINN ([id number]) [phone].pdf Page 7 of 13 Assess patient/caregiver ability to obtain necessary supplies Assess patient/caregiver ability to perform ulcer/skin care regimen upon admission and as needed Assess ulceration(s) every  visit Notes: Electronic Signature(s) Signed: 09/09/2023 3:34:19 PM By: Yevonne Pax RN Entered By: Yevonne Pax on 09/06/2023 09:24:53 -------------------------------------------------------------------------------- Pain Assessment Details Patient Name: Date of Service: Salena Saner HN A. 09/06/2023 8:45 A M Medical Record Number: [id number] Patient Account Number: [id number] Date of Birth/Sex: Treating RN: [birth year] (77 y.o. Tyler [name]) Yevonne Pax Primary Care Tylen Leverich: [facility] NA Fernande Boyden Other  Clinician: Referring Rahn Lacuesta: Treating Roanne Haye/Extender: Baxter Kail Weeks in Treatment: 0 Active Problems Location of Pain Severity and Description of Pain Patient Has Paino No Site Locations Pain Management and Medication Current Pain Management: Electronic Signature(s) Signed: 09/09/2023 3:34:19 PM By: Yevonne Pax RN Entered By: Yevonne Pax on 09/06/2023 08:49:36 Nill, Mayra Neer ([id number]) [id number] Page 8 of 13 -------------------------------------------------------------------------------- Patient/Caregiver Education Details Patient Name: Date of Service: [place] A. 12/13/2024andnbsp8:45 A M Medical Record Number: [id number] Patient Account Number: [id number] Date of Birth/Gender: Treating RN: August 17, 1946 (77 y.o. Tyler [name]) Yevonne Pax Primary Care Physician: [facility] NA Fernande Boyden Other Clinician: Referring Physician: Treating Physician/Extender: Florentina Jenny in Treatment: 0 Education Assessment Education Provided To: Patient Education Topics Provided Welcome [facility] Packet: o Handouts: Welcome T [facility] o Methods: Explain/Verbal Responses: State content correctly Electronic Signature(s) Signed: 09/09/2023 3:34:19 PM By: Yevonne Pax RN Entered By: Yevonne Pax on 09/06/2023 09:25:25 -------------------------------------------------------------------------------- Wound Assessment Details Patient Name: Date of Service: Salena Saner HN A. 09/06/2023 8:45 A M Medical Record Number: [id number] Patient Account Number: [id number] Date of Birth/Sex: Treating RN: [birth year] (77 y.o. Tyler [name]) Yevonne Pax Primary Care Lysle Yero: [facility] NA Fernande Boyden Other Clinician: Referring Miosotis Wetsel: Treating Anarely Nicholls/Extender: Baxter Kail Weeks in Treatment: 0 Wound Status Wound Number: 1 Primary Diabetic Wound/Ulcer of the Lower Extremity Etiology: Wound Location: Right T  Great oe Wound Open Wounding Event: Gradually Appeared Status: Date Acquired: 06/25/2023 Comorbid Lymphedema, Congestive Heart Failure, Hypertension, Peripheral Weeks Of Treatment: 0 History: Arterial Disease, Type II Diabetes Clustered Wound: No Photos CHRISTAFER, MCCLURG ([id number]) [id number] Page 9 of 13 Wound Measurements Length: (cm) 4 Width: (cm) 3.2 Depth: (cm) 0.3 Area: (cm) 10.053 Volume: (cm) 3.016 % Reduction in Area: % Reduction in Volume: Epithelialization: None Tunneling: No Undermining: No Wound Description Classification: Grade 2 Exudate Amount: Medium Exudate Type: Serosanguineous Exudate Color: red, brown Foul Odor After Cleansing: No Slough/Fibrino Yes Wound Bed Granulation Amount: Large (67-100%) Exposed Structure Granulation Quality: Red Fascia Exposed: No Necrotic Amount: Small (1-33%) Fat Layer (Subcutaneous Tissue) Exposed: Yes Necrotic Quality: Adherent Slough Tendon Exposed: No Muscle Exposed: No Joint Exposed: No Bone Exposed: No Treatment Notes Wound #1 (Toe Great) Wound Laterality: Right Cleanser Byram Ancillary Kit - 15 Day Supply Discharge Instruction: Use supplies as instructed; Kit contains: (15) Saline Bullets; (15) 3x3 Gauze; 15 pr Gloves Soap and Water Discharge Instruction: Gently cleanse wound with antibacterial soap, rinse and pat dry prior to dressing wounds Peri-Wound Care Topical Primary Dressing IODOFLEX 0.9% Cadexomer Iodine Pad Discharge Instruction: Apply Iodoflex to wound bed only as directed. Secondary Dressing (BORDER) Zetuvit Plus SILICONE BORDER Dressing 4x4 (in/in) Discharge Instruction: Please do not put silicone bordered dressings under wraps. Use non-bordered dressing only. Secured With Compression Wrap Compression Stockings Facilities manager) Signed: 09/09/2023 3:34:19 PM By: Yevonne Pax RN Entered By: Yevonne Pax on 09/06/2023 09:14:39 Wiater, Mayra Neer  ([id number]) [id number] Page 10 of 13 -------------------------------------------------------------------------------- Wound Assessment Details Patient Name: Date of Service: Salena Saner [place] A. 09/06/2023 8:45 A M Medical Record Number:  [id number] Patient Account Number: [id number] Date of Birth/Sex: Treating RN: 1946-09-24 (77 y.o. Tyler [name]) Yevonne Pax Primary Care Isella Slatten: [facility] NA Fernande Boyden Other Clinician: Referring Lynsi Dooner: Treating Toneshia Coello/Extender: Baxter Kail Weeks in Treatment: 0 Wound Status Wound Number: 2 Primary Diabetic Wound/Ulcer of the Lower Extremity Etiology: Wound Location: Right Metatarsal head fifth Wound Open Wounding Event: Gradually Appeared Status: Date Acquired: 06/25/2023 Comorbid Lymphedema, Congestive Heart Failure, Hypertension, Peripheral Weeks Of Treatment: 0 History: Arterial Disease, Type II Diabetes Clustered Wound: No Photos Wound Measurements Length: (cm) 1.3 Width: (cm) 1.4 Depth: (cm) 0.2 Area: (cm) 1.429 Volume: (cm) 0.286 % Reduction in Area: % Reduction in Volume: Epithelialization: None Tunneling: No Undermining: No Wound Description Classification: Grade 2 Exudate Amount: Medium Exudate Type: Serosanguineous Exudate Color: red, brown Foul Odor After Cleansing: No Slough/Fibrino Yes Wound Bed Granulation Amount: Medium (34-66%) Exposed Structure Granulation Quality: Red Fascia Exposed: No Necrotic Amount: Medium (34-66%) Fat Layer (Subcutaneous Tissue) Exposed: Yes Necrotic Quality: Adherent Slough Tendon Exposed: No Muscle Exposed: No Joint Exposed: No Bone Exposed: No Treatment Notes Wound #2 (Metatarsal head fifth) Wound Laterality: Right Cleanser Byram Ancillary Kit - 15 Day Supply Discharge Instruction: Use supplies as instructed; Kit contains: (15) Saline Bullets; (15) 3x3 Gauze; 15 pr Gloves Soap and [address] MARQUAY, COULON A ([id number]) [id number]  Page 11 of 13 Discharge Instruction: Gently cleanse wound with antibacterial soap, rinse and pat dry prior to dressing wounds Peri-Wound Care Topical Primary Dressing IODOFLEX 0.9% Cadexomer Iodine Pad Discharge Instruction: Apply Iodoflex to wound bed only as directed. Secondary Dressing (BORDER) Zetuvit Plus SILICONE BORDER Dressing 4x4 (in/in) Discharge Instruction: Please do not put silicone bordered dressings under wraps. Use non-bordered dressing only. Secured With Compression Wrap Compression Stockings Facilities manager) Signed: 09/09/2023 3:34:19 PM By: Yevonne Pax RN Entered By: Yevonne Pax on 09/06/2023 09:16:39 -------------------------------------------------------------------------------- Wound Assessment Details Patient Name: Date of Service: Salena Saner HN A. 09/06/2023 8:45 A M Medical Record Number: [id number] Patient Account Number: [id number] Date of Birth/Sex: Treating RN: 05/08/1946 (77 y.o. Tyler [name]) Yevonne Pax Primary Care Shaune Westfall: [facility] NA Fernande Boyden Other Clinician: Referring Londyn Wotton: Treating Isaah Furry/Extender: Baxter Kail Weeks in Treatment: 0 Wound Status Wound Number: 3 Primary Diabetic Wound/Ulcer of the Lower Extremity Etiology: Wound Location: Left Foot Wound Open Wounding Event: Gradually Appeared Status: Date Acquired: 06/25/2023 Comorbid Lymphedema, Congestive Heart Failure, Hypertension, Peripheral Weeks Of Treatment: 0 History: Arterial Disease, Type II Diabetes Clustered Wound: No Photos Wound Measurements Length: (cm) 2.3 Width: (cm) 3 Depth: (cm) 0.4 Area: (cm) 5.419 Schoenberger, Ivery A ([id number]) Volume: (cm) 2.168 % Reduction in Area: % Reduction in Volume: Tunneling: No Undermining: No [id number] Page 12 of 13 Wound Description Classification: Grade 2 Exudate Amount: Medium Exudate Type: Serosanguineous Exudate Color: red, brown Foul Odor After Cleansing:  No Slough/Fibrino Yes Wound Bed Granulation Amount: Medium (34-66%) Exposed Structure Granulation Quality: Red Fascia Exposed: No Necrotic Amount: Medium (34-66%) Fat Layer (Subcutaneous Tissue) Exposed: Yes Necrotic Quality: Adherent Slough Tendon Exposed: No Muscle Exposed: No Joint Exposed: No Bone Exposed: No Treatment Notes Wound #3 (Foot) Wound Laterality: Left Cleanser Byram Ancillary Kit - 15 Day Supply Discharge Instruction: Use supplies as instructed; Kit contains: (15) Saline Bullets; (15) 3x3 Gauze; 15 pr Gloves Soap and Water Discharge Instruction: Gently cleanse wound with antibacterial soap, rinse and pat dry prior to dressing wounds Peri-Wound Care Topical Primary Dressing IODOFLEX 0.9% Cadexomer Iodine Pad Discharge Instruction: Apply Iodoflex to wound bed only as directed. Secondary Dressing (BORDER) Zetuvit Plus SILICONE  BORDER Dressing 4x4 (in/in) Discharge Instruction: Please do not put silicone bordered dressings under wraps. Use non-bordered dressing only. Secured With Compression Wrap Compression Stockings Facilities manager) Signed: 09/09/2023 3:34:19 PM By: Yevonne Pax RN Entered By: Yevonne Pax on 09/06/2023 09:19:01 -------------------------------------------------------------------------------- Vitals Details Patient Name: Date of Service: Tyler [name], Tyler [name] HN A. 09/06/2023 8:45 A M Medical Record Number: [id number] Patient Account Number: [id number] Date of Birth/Sex: Treating RN: 08-28-1946 (77 y.o. Tyler [name]) Yevonne Pax Primary Care Liel Rudden: [facility] NA Fernande Boyden Other Clinician: Referring Delva Derden: Treating Amara Justen/Extender: Florentina Jenny in Treatment: 0 Vital Signs SHERALD, BINION ([id number]) [id number] Page 13 of 13 Time Taken: 08:49 Temperature (F): 97.8 Height (in): 78 Pulse (bpm): 107 Source: Stated Respiratory Rate (breaths/min): 18 Weight (lbs): 250 Blood Pressure (mmHg):  109/53 Source: Stated Reference Range: 80 - 120 mg / dl Body Mass Index (BMI): 28.9 Electronic Signature(s) Signed: 09/09/2023 3:34:19 PM By: Yevonne Pax RN Entered By: Yevonne Pax on 09/06/2023 08:50:33

## 2023-09-09 NOTE — Progress Notes (Signed)
Tyler [name], Tyler [name] ([id number]) [id number] Page 1 of 13 Visit Report for 09/06/2023 Chief Complaint Document Details Patient Name: Date of Service: [place] A. 09/06/2023 8:45 A M Medical Record Number: [id number] Patient Account Number: [id number] Date of Birth/Sex: Treating RN: [birth year] (77 y.o. Tyler [name]) Yevonne Pax Primary Care Provider: [facility] NA Fernande Boyden Other Clinician: Referring Provider: Treating Provider/Extender: Baxter Kail Weeks in Treatment: 0 Information Obtained from: Patient Chief Complaint Bilateral foot ulcers Electronic Signature(s) Signed: 09/06/2023 9:34:58 AM By: Allen Derry PA-C Entered By: Allen Derry on 09/06/2023 09:34:58 -------------------------------------------------------------------------------- Debridement Details Patient Name: Date of Service: Tyler [name], Tyler Chapel HN A. 09/06/2023 8:45 A M Medical Record Number: [id number] Patient Account Number: [id number] Date of Birth/Sex: Treating RN: May 06, 1946 (77 y.o. Tyler [name]) Yevonne Pax Primary Care Provider: [facility] NA Fernande Boyden Other Clinician: Referring Provider: Treating Provider/Extender: Baxter Kail Weeks in Treatment: 0 Debridement Performed for Assessment: Wound #3 Left Foot Performed By: Physician Allen Derry, PA-C The following information was scribed by: Yevonne Pax The information was scribed for: Allen Derry Debridement Type: Debridement Severity of Tissue Pre Debridement: Fat layer exposed Level of Consciousness (Pre-procedure): Awake and Alert Pre-procedure Verification/Time Out Yes - 09:50 Taken: Start Time: 09:50 Percent of Wound Bed Debrided: 50% T Area Debrided (cm): otal 2.71 Tissue and other material debrided: Viable, Non-Viable, Callus, Subcutaneous, Skin: Dermis , Skin: Epidermis Level: Skin/Subcutaneous Tissue Debridement Description: Excisional Instrument: Curette Bleeding: Minimum Hemostasis Achieved: Pressure CONCETTO, SINGLETON  ([id number]) [id number] Page 2 of 13 End Time: 10:02 Procedural Pain: 0 Post Procedural Pain: 0 Response to Treatment: Procedure was tolerated well Level of Consciousness (Post- Awake and Alert procedure): Post Debridement Measurements of Total Wound Length: (cm) 2.3 Width: (cm) 3 Depth: (cm) 0.4 Volume: (cm) 2.168 Character of Wound/Ulcer Post Debridement: Improved Severity of Tissue Post Debridement: Fat layer exposed Post Procedure Diagnosis Same as Pre-procedure Electronic Signature(s) Signed: 09/06/2023 12:55:12 PM By: Allen Derry PA-C Signed: 09/09/2023 3:34:19 PM By: Yevonne Pax RN Entered By: Yevonne Pax on 09/06/2023 09:55:41 -------------------------------------------------------------------------------- Debridement Details Patient Name: Date of Service: Tyler [name], Tyler Chapel HN A. 09/06/2023 8:45 A M Medical Record Number: [id number] Patient Account Number: [id number] Date of Birth/Sex: Treating RN: [birth year] (77 y.o. Tyler [name]) Yevonne Pax Primary Care Provider: [facility] NA Fernande Boyden Other Clinician: Referring Provider: Treating Provider/Extender: Florentina Jenny in Treatment: 0 Debridement Performed for Assessment: Wound #2 Right Metatarsal head fifth Performed By: Physician Allen Derry, PA-C The following information was scribed by: Yevonne Pax The information was scribed for: Allen Derry Debridement Type: Debridement Severity of Tissue Pre Debridement: Fat layer exposed Level of Consciousness (Pre-procedure): Awake and Alert Pre-procedure Verification/Time Out Yes - 09:50 Taken: Start Time: 09:50 Percent of Wound Bed Debrided: 50% T Area Debrided (cm): otal 0.71 Tissue and other material debrided: Viable, Non-Viable, Callus, Subcutaneous, Skin: Dermis , Skin: Epidermis Level: Skin/Subcutaneous Tissue Debridement Description: Excisional Instrument: Curette Bleeding: Minimum Hemostasis Achieved: Pressure End Time:  10:02 Procedural Pain: 0 Post Procedural Pain: 0 Response to Treatment: Procedure was tolerated well Level of Consciousness (Post- Awake and Alert procedure): Post Debridement Measurements of Total Wound Length: (cm) 1.3 Width: (cm) 1.4 Depth: (cm) 0.2 Volume: (cm) 0.286 Tyler [name], DRECHSLER A ([id number]) [id number] Page 3 of 13 Character of Wound/Ulcer Post Debridement: Improved Severity of Tissue Post Debridement: Fat layer exposed Post Procedure Diagnosis Same as Pre-procedure Electronic Signature(s) Signed: 09/06/2023 12:55:12 PM By: Allen Derry PA-C Signed: 09/09/2023 3:34:19 PM By: Yevonne Pax RN Entered By: Jettie Pagan  Carrie on 09/06/2023 09:56:39 -------------------------------------------------------------------------------- Debridement Details Patient Name: Date of Service: Tyler [place] A. 09/06/2023 8:45 A M Medical Record Number: [id number] Patient Account Number: [id number] Date of Birth/Sex: Treating RN: 01/14/1946 (77 y.o. Tyler [name]) Yevonne Pax Primary Care Provider: [facility] NA Fernande Boyden Other Clinician: Referring Provider: Treating Provider/Extender: Baxter Kail Weeks in Treatment: 0 Debridement Performed for Assessment: Wound #1 Right T Great oe Performed By: Physician Allen Derry, PA-C The following information was scribed by: Yevonne Pax The information was scribed for: Allen Derry Debridement Type: Debridement Severity of Tissue Pre Debridement: Fat layer exposed Level of Consciousness (Pre-procedure): Awake and Alert Pre-procedure Verification/Time Out Yes - 09:50 Taken: Start Time: 09:50 Percent of Wound Bed Debrided: 50% T Area Debrided (cm): otal 5.02 Tissue and other material debrided: Viable, Non-Viable, Bone, Callus, Subcutaneous, Skin: Dermis , Skin: Epidermis Level: Skin/Subcutaneous Tissue/Muscle/Bone Debridement Description: Excisional Instrument: Curette Bleeding: Minimum Hemostasis Achieved: Pressure End  Time: 10:02 Procedural Pain: 0 Post Procedural Pain: 0 Response to Treatment: Procedure was tolerated well Level of Consciousness (Post- Awake and Alert procedure): Post Debridement Measurements of Total Wound Length: (cm) 4 Width: (cm) 3.2 Depth: (cm) 0.3 Volume: (cm) 3.016 Character of Wound/Ulcer Post Debridement: Improved Severity of Tissue Post Debridement: Fat layer exposed Post Procedure Diagnosis Same as Pre-procedure Electronic Signature(s) Signed: 09/06/2023 12:55:12 PM By: Allen Derry PA-C Signed: 09/09/2023 3:34:19 PM By: Yevonne Pax RN Entered By: Yevonne Pax on 09/06/2023 10:00:05 Tyler [name] ([id number]) [id number] Page 4 of 13 -------------------------------------------------------------------------------- HPI Details Patient Name: Date of Service: Tyler Saner [place] A. 09/06/2023 8:45 A M Medical Record Number: [id number] Patient Account Number: [id number] Date of Birth/Sex: Treating RN: 08-15-1946 (77 y.o. Tyler [name]) Yevonne Pax Primary Care Provider: [facility] NA Fernande Boyden Other Clinician: Referring Provider: Treating Provider/Extender: Baxter Kail Weeks in Treatment: 0 History of Present Illness Chronic/Inactive Conditions Condition 1: 09-06-2023 patient's arterial flow is questionable in my opinion. His ABIs were on the left 0.64 and on the right 0.92 as noted in the clinic. However there is note through epic of him still having significant peripheral vascular disease. I would like for him to go back and see vascular to be honest to further evaluate his arterial flow and ability to heal his wounds. HPI Description: 09-06-2023 upon evaluation today patient presents for initial inspection here in the clinic concerning several significant ulcers of his feet bilaterally right and left. On the right side he has a great toe ulcer as well as fifth metatarsal head ulcer. On the left left foot he has a medial plantar foot ulcer. These  wounds began somewhere around 1 October. With that being said unfortunately through the course of what has been done for him up to this point it has been noted that he originally did not show any CT evidence of osteomyelitis and that was on 07-06-2023 where he had CT Nonetheless it has been somewhat s. concerning for the possibility of osteomyelitis especially with the fact that on the great toe there appears to be bone exposed at this point. I explained to him that if it is not infected and showing signs of bone infection yet that it will eventually get to that point. Unfortunately based on what I am seeing I think that the patient is can have a difficult time getting these wounds to heal. Also think there might be some issues here with blood flow yet still he has been seen by vascular I do not really trust the  readings that we got on the left was 0.64 on the right 0.9 to I still think we may have an issue here and according to the last arterial study it appears that there is some issues here with blood flow and significant stenosis even after intervention back in February of this year. Patient has a significant issue with congestive heart failure and he has a left ventricular ejection fraction of 30 to 35% and subsequently his BN P on 12-10- 2024 was 1961.5. He has been seen regularly by the heart failure clinic. The patient is a diabetic his most recent hemoglobin A1c was 7.2. Again he has had CT scans I will copy the results of her as well below from that study. Otherwise the patient also has atrial fibrillation for which she is on aspirin, a heart block, chronic kidney disease stage III, lymphedema, and again the diabetes mellitus type 2. CT FOOT LEFT WO CONTRAST 07/06/2023 IMPRESSION: 1. Unchanged superficial ulceration along the medial plantar aspect of the midfoot near the base of the medial cuneiform. No CT evidence of osteomyelitis. 2. Prior first ray amputation. CT FOOT RIGHT WO CONTRAST  07/06/2023 IMPRESSION: 1. Superficial ulceration along the plantar aspect of the first IP joint. No CT evidence of osteomyelitis. 2. Unchanged severe first MTP joint osteoarthritis. 3. 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 Electronic Signature(s) Signed: 09/09/2023 10:30:05 AM By: Allen Derry PA-C Previous Signature: 09/06/2023 12:53:49 PM Version By: Allen Derry PA-C Previous Signature: 09/06/2023 12:53:42 PM Version By: Allen Derry PA-C Entered By: Allen Derry on 09/09/2023 10:30:04 -------------------------------------------------------------------------------- Physical Exam Details Patient Name: Date of Service: Tyler Saner HN A. 09/06/2023 8:45 A M Medical Record Number: [id number] Patient Account Number: [id number] Date of Birth/Sex: Treating RN: 1946-06-29 (77 y.o. Tyler [name]) Seferino, Stepanski A ([id number]) [id number] Page 5 of 13 Primary Care Provider: [facility] NA Fernande Boyden Other Clinician: Referring Provider: Treating Provider/Extender: Florentina Jenny in Treatment: 0 Constitutional sitting or standing blood pressure is within target range for patient.. pulse regular and within target range for patient.Marland Kitchen respirations regular, non-labored and within target range for patient.Marland Kitchen temperature within target range for patient.. Well-nourished and well-hydrated in no acute distress. Eyes conjunctiva clear no eyelid edema noted. pupils equal round and reactive to light and accommodation. Ears, Nose, Mouth, and Throat no gross abnormality of ear auricles or external auditory canals. normal hearing noted during conversation. mucus membranes moist. Respiratory normal breathing without difficulty. Cardiovascular Absent posterior tibial and dorsalis pedis pulses bilateral lower extremities. 1+ pitting edema of the bilateral lower extremities. Musculoskeletal Patient unable to walk without assistance. Psychiatric this patient  is able to make decisions and demonstrates good insight into disease process. Alert and Oriented x 3. pleasant and cooperative. Notes Upon inspection patient's wound bed actually showed signs of having some fairly significant wounds over his feet in general. I explained to the patient that I do believe that he is gena need to be aggressive in treatment as I think he is at high risk for amputation. Especially if his blood flow is not excellent which it seems like there is not a good evidence of blood flow here. Palpably I am not able to get much in the way of pulses however we were able to get them by Doppler for the ABIs and evaluation. Electronic Signature(s) Signed: 09/09/2023 10:30:54 AM By: Allen Derry PA-C Entered By: Allen Derry on 09/09/2023 10:30:53 -------------------------------------------------------------------------------- Physician Orders Details Patient Name: Date of Service: Tyler Saner HN  A. 09/06/2023 8:45 A M Medical Record Number: [id number] Patient Account Number: [id number] Date of Birth/Sex: Treating RN: Aug 16, 1946 (77 y.o. Tyler [name]) Yevonne Pax Primary Care Provider: [facility] NA Fernande Boyden Other Clinician: Referring Provider: Treating Provider/Extender: Florentina Jenny in Treatment: 0 The following information was scribed by: Yevonne Pax The information was scribed for: Allen Derry Verbal / Phone Orders: No Diagnosis Coding ICD-10 Coding Code Description E11.621 Type 2 diabetes mellitus with foot ulcer L97.512 Non-pressure chronic ulcer of other part of right foot with fat layer exposed L97.522 Non-pressure chronic ulcer of other part of left foot with fat layer exposed I89.0 Lymphedema, not elsewhere classified I50.42 Chronic combined systolic (congestive) and diastolic (congestive) heart failure I10 Essential (primary) hypertension I48.0 Paroxysmal atrial fibrillation HTOO, KANIECKI A ([id number]) [phone].pdf Page 6 of  13 I45.5 Other specified heart block N18.30 Chronic kidney disease, stage 3 unspecified Follow-up Appointments Return Appointment in 1 week. Bathing/ Shower/ Hygiene May shower with wound dressing protected with water repellent cover or cast protector. Edema Control - Orders / Instructions Elevate, Exercise Daily and A void Standing for Long Periods of Time. Elevate legs to the level of the heart and pump ankles as often as possible Elevate leg(s) parallel to the floor when sitting. Wound Treatment Wound #1 - T Great oe Wound Laterality: Right Cleanser: Byram Ancillary Kit - 15 Day Supply (DME) (Generic) 3 x Per Week/30 Days Discharge Instructions: Use supplies as instructed; Kit contains: (15) Saline Bullets; (15) 3x3 Gauze; 15 pr Gloves Cleanser: Soap and Water 3 x Per Week/30 Days Discharge Instructions: Gently cleanse wound with antibacterial soap, rinse and pat dry prior to dressing wounds Prim Dressing: IODOFLEX 0.9% Cadexomer Iodine Pad (DME) (Generic) 3 x Per Week/30 Days ary Discharge Instructions: Apply Iodoflex to wound bed only as directed. Secondary Dressing: (BORDER) Zetuvit Plus SILICONE BORDER Dressing 4x4 (in/in) (DME) (Generic) 3 x Per Week/30 Days Discharge Instructions: Please do not put silicone bordered dressings under wraps. Use non-bordered dressing only. Wound #2 - Metatarsal head fifth Wound Laterality: Right Cleanser: Byram Ancillary Kit - 15 Day Supply (DME) (Generic) 3 x Per Week/30 Days Discharge Instructions: Use supplies as instructed; Kit contains: (15) Saline Bullets; (15) 3x3 Gauze; 15 pr Gloves Cleanser: Soap and Water 3 x Per Week/30 Days Discharge Instructions: Gently cleanse wound with antibacterial soap, rinse and pat dry prior to dressing wounds Prim Dressing: IODOFLEX 0.9% Cadexomer Iodine Pad (DME) (Generic) 3 x Per Week/30 Days ary Discharge Instructions: Apply Iodoflex to wound bed only as directed. Secondary Dressing: (BORDER) Zetuvit Plus  SILICONE BORDER Dressing 4x4 (in/in) (DME) (Generic) 3 x Per Week/30 Days Discharge Instructions: Please do not put silicone bordered dressings under wraps. Use non-bordered dressing only. Wound #3 - Foot Wound Laterality: Left Cleanser: Byram Ancillary Kit - 15 Day Supply (DME) (Generic) 3 x Per Week/30 Days Discharge Instructions: Use supplies as instructed; Kit contains: (15) Saline Bullets; (15) 3x3 Gauze; 15 pr Gloves Cleanser: Soap and Water 3 x Per Week/30 Days Discharge Instructions: Gently cleanse wound with antibacterial soap, rinse and pat dry prior to dressing wounds Prim Dressing: IODOFLEX 0.9% Cadexomer Iodine Pad (DME) (Generic) 3 x Per Week/30 Days ary Discharge Instructions: Apply Iodoflex to wound bed only as directed. Secondary Dressing: (BORDER) Zetuvit Plus SILICONE BORDER Dressing 4x4 (in/in) (DME) (Generic) 3 x Per Week/30 Days Discharge Instructions: Please do not put silicone bordered dressings under wraps. Use non-bordered dressing only. Consults Vascular - eval for blood flow enough for wound healing - (ICD10  Z61.096 - Type 2 diabetes mellitus with foot ulcer) Electronic Signature(s) Signed: 09/06/2023 12:55:12 PM By: Allen Derry PA-C Signed: 09/09/2023 3:34:19 PM By: Yevonne Pax RN Entered By: Yevonne Pax on 09/06/2023 10:01:06 Tyler [name] ([id number]) [phone].pdf Page 7 of 13 -------------------------------------------------------------------------------- Problem List Details Patient Name: Date of Service: Tyler Saner [place] A. 09/06/2023 8:45 A M Medical Record Number: [id number] Patient Account Number: [id number] Date of Birth/Sex: Treating RN: 18-May-1946 (77 y.o. Tyler [name]) Yevonne Pax Primary Care Provider: [facility] NA Fernande Boyden Other Clinician: Referring Provider: Treating Provider/Extender: Baxter Kail Weeks in Treatment: 0 Active Problems ICD-10 Encounter Code Description Active Date MDM Diagnosis E11.621 Type 2  diabetes mellitus with foot ulcer 09/06/2023 No Yes L97.512 Non-pressure chronic ulcer of other part of right foot with fat layer exposed 09/06/2023 No Yes L97.522 Non-pressure chronic ulcer of other part of left foot with fat layer exposed 09/06/2023 No Yes I89.0 Lymphedema, not elsewhere classified 09/06/2023 No Yes I50.42 Chronic combined systolic (congestive) and diastolic (congestive) heart failure 09/06/2023 No Yes I10 Essential (primary) hypertension 09/06/2023 No Yes I48.0 Paroxysmal atrial fibrillation 09/06/2023 No Yes I45.5 Other specified heart block 09/06/2023 No Yes N18.30 Chronic kidney disease, stage 3 unspecified 09/06/2023 No Yes Inactive Problems Resolved Problems Electronic Signature(s) Signed: 09/06/2023 9:34:41 AM By: Allen Derry PA-C Entered By: Allen Derry on 09/06/2023 09:34:41 Tyler [name], Tyler [name] ([id number]) [id number] Page 8 of 13 -------------------------------------------------------------------------------- Progress Note Details Patient Name: Date of Service: Tyler Saner [place] A. 09/06/2023 8:45 A M Medical Record Number: [id number] Patient Account Number: [id number] Date of Birth/Sex: Treating RN: [birth year] (77 y.o. Tyler [name]) Yevonne Pax Primary Care Provider: [facility] NA Fernande Boyden Other Clinician: Referring Provider: Treating Provider/Extender: Baxter Kail Weeks in Treatment: 0 Subjective Chief Complaint Information obtained from Patient Bilateral foot ulcers History of Present Illness (HPI) Chronic/Inactive Condition: 09-06-2023 patient's arterial flow is questionable in my opinion. His ABIs were on the left 0.64 and on the right 0.92 as noted in the clinic. However there is note through epic of him still having significant peripheral vascular disease. I would like for him to go back and see vascular to be honest to further evaluate his arterial flow and ability to heal his wounds. 09-06-2023 upon evaluation today patient  presents for initial inspection here in the clinic concerning several significant ulcers of his feet bilaterally right and left. On the right side he has a great toe ulcer as well as fifth metatarsal head ulcer. On the left left foot he has a medial plantar foot ulcer. These wounds began somewhere around 1 October. With that being said unfortunately through the course of what has been done for him up to this point it has been noted that he originally did not show any CT evidence of osteomyelitis and that was on 07-06-2023 where he had CT Nonetheless it has been somewhat concerning s. for the possibility of osteomyelitis especially with the fact that on the great toe there appears to be bone exposed at this point. I explained to him that if it is not infected and showing signs of bone infection yet that it will eventually get to that point. Unfortunately based on what I am seeing I think that the patient is can have a difficult time getting these wounds to heal. Also think there might be some issues here with blood flow yet still he has been seen by vascular I do not really trust the readings that we got on the left was 0.64 on  the right 0.9 to I still think we may have an issue here and according to the last arterial study it appears that there is some issues here with blood flow and significant stenosis even after intervention back in February of this year. Patient has a significant issue with congestive heart failure and he has a left ventricular ejection fraction of 30 to 35% and subsequently his BN P on 12-10- 2024 was 1961.5. He has been seen regularly by the heart failure clinic. The patient is a diabetic his most recent hemoglobin A1c was 7.2. Again he has had CT scans I will copy the results of her as well below from that study. Otherwise the patient also has atrial fibrillation for which she is on aspirin, a heart block, chronic kidney disease stage III, lymphedema, and again the diabetes  mellitus type 2. CT FOOT LEFT WO CONTRAST 07/06/2023 IMPRESSION: 1. Unchanged superficial ulceration along the medial plantar aspect of the midfoot near the base of the medial cuneiform. No CT evidence of osteomyelitis. 2. Prior first ray amputation. CT FOOT RIGHT WO CONTRAST 07/06/2023 IMPRESSION: 1. Superficial ulceration along the plantar aspect of the first IP joint. No CT evidence of osteomyelitis. 2. Unchanged severe first MTP joint osteoarthritis. 3. 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 Patient History Allergies Eliquis, spironolactone Social History Never smoker, Marital Status - Divorced, Alcohol Use - Rarely, Drug Use - No History, Caffeine Use - Rarely. Medical History Hematologic/Lymphatic Patient has history of Lymphedema Cardiovascular Patient has history of Congestive Heart Failure, Hypertension, Peripheral Arterial Disease Endocrine Patient has history of Type II Diabetes Patient is treated with Oral Agents. Review of Systems (ROS) Genitourinary CKD Integumentary (Skin) Complains or has symptoms of Wounds. FREMON, OHLMAN ([id number]) [id number] Page 9 of 13 Objective Constitutional sitting or standing blood pressure is within target range for patient.. pulse regular and within target range for patient.Marland Kitchen respirations regular, non-labored and within target range for patient.Marland Kitchen temperature within target range for patient.. Well-nourished and well-hydrated in no acute distress. Vitals Time Taken: 8:49 AM, Height: 78 in, Source: Stated, Weight: 250 lbs, Source: Stated, BMI: 28.9, Temperature: 97.8 F, Pulse: 107 bpm, Respiratory Rate: 18 breaths/min, Blood Pressure: 109/53 mmHg. Eyes conjunctiva clear no eyelid edema noted. pupils equal round and reactive to light and accommodation. Ears, Nose, Mouth, and Throat no gross abnormality of ear auricles or external auditory canals. normal hearing noted during conversation.  mucus membranes moist. Respiratory normal breathing without difficulty. Cardiovascular Absent posterior tibial and dorsalis pedis pulses bilateral lower extremities. 1+ pitting edema of the bilateral lower extremities. Musculoskeletal Patient unable to walk without assistance. Psychiatric this patient is able to make decisions and demonstrates good insight into disease process. Alert and Oriented x 3. pleasant and cooperative. General Notes: Upon inspection patient's wound bed actually showed signs of having some fairly significant wounds over his feet in general. I explained to the patient that I do believe that he is gena need to be aggressive in treatment as I think he is at high risk for amputation. Especially if his blood flow is not excellent which it seems like there is not a good evidence of blood flow here. Palpably I am not able to get much in the way of pulses however we were able to get them by Doppler for the ABIs and evaluation. Integumentary (Hair, Skin) Wound #1 status is Open. Original cause of wound was Gradually Appeared. The date acquired was: 06/25/2023. The wound is located on the Right T Great.  oe The wound measures 4cm length x 3.2cm width x 0.3cm depth; 10.053cm^2 area and 3.016cm^3 volume. There is Fat Layer (Subcutaneous Tissue) exposed. There is no tunneling or undermining noted. There is a medium amount of serosanguineous drainage noted. There is large (67-100%) red granulation within the wound bed. There is a small (1-33%) amount of necrotic tissue within the wound bed including Adherent Slough. Wound #2 status is Open. Original cause of wound was Gradually Appeared. The date acquired was: 06/25/2023. The wound is located on the Right Metatarsal head fifth. The wound measures 1.3cm length x 1.4cm width x 0.2cm depth; 1.429cm^2 area and 0.286cm^3 volume. There is Fat Layer (Subcutaneous Tissue) exposed. There is no tunneling or undermining noted. There is a medium  amount of serosanguineous drainage noted. There is medium (34-66%) red granulation within the wound bed. There is a medium (34-66%) amount of necrotic tissue within the wound bed including Adherent Slough. Wound #3 status is Open. Original cause of wound was Gradually Appeared. The date acquired was: 06/25/2023. The wound is located on the Left Foot. The wound measures 2.3cm length x 3cm width x 0.4cm depth; 5.419cm^2 area and 2.168cm^3 volume. There is Fat Layer (Subcutaneous Tissue) exposed. There is no tunneling or undermining noted. There is a medium amount of serosanguineous drainage noted. There is medium (34-66%) red granulation within the wound bed. There is a medium (34-66%) amount of necrotic tissue within the wound bed including Adherent Slough. 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 There was a Excisional Skin/Subcutaneous Tissue/Muscle/Bone Debridement with a total area of 5.02 sq cm performed by Allen Derry, PA- C. With the following instrument(s): Curette to remove Viable and Non-Viable tissue/material. Material removed includes Bone,Callus, Subcutaneous Tissue, Skin: Dermis, and Skin: Epidermis. No specimens were taken. A time out was conducted at 09:50, prior to the start of the procedure. A Minimum amount of bleeding was controlled with Pressure.  The procedure was tolerated well with a pain level of 0 throughout and a pain level of 0 following the procedure. Post Debridement Measurements: 4cm length x 3.2cm width x 0.3cm depth; 3.016cm^3 volume. TYSHUN, BATTON ([id number]) [id number] Page 10 of 13 Character of Wound/Ulcer Post Debridement is improved. Severity of Tissue Post Debridement is: Fat layer exposed. Post procedure Diagnosis Wound #1: Same as Pre-Procedure Wound #2 Pre-procedure diagnosis of Wound #2 is a Diabetic Wound/Ulcer of the Lower Extremity located on the Right Metatarsal head fifth .Severity of Tissue Pre Debridement is: Fat layer exposed. There was a Excisional Skin/Subcutaneous Tissue Debridement with a total area of 0.71 sq cm performed by Allen Derry, PA-C. With the following instrument(s): Curette to remove Viable and Non-Viable tissue/material. Material removed includes Callus, Subcutaneous Tissue, Skin: Dermis, and Skin: Epidermis. No specimens were taken. A time out was conducted at 09:50, prior to the start of the procedure. A Minimum amount of bleeding was controlled with Pressure. The procedure was tolerated well with a pain level of 0 throughout and a pain  level of 0 following the procedure. Post Debridement Measurements: 1.3cm length x 1.4cm width x 0.2cm depth; 0.286cm^3 volume. Character of Wound/Ulcer Post Debridement is improved. Severity of Tissue Post Debridement is: Fat layer exposed. Post procedure Diagnosis Wound #2: Same as Pre-Procedure Wound #3 Pre-procedure diagnosis of Wound #3 is a Diabetic Wound/Ulcer of the Lower Extremity located on the Left Foot .Severity of Tissue Pre Debridement is: Fat layer exposed. There was a Excisional Skin/Subcutaneous Tissue Debridement with a total area of 2.71 sq cm performed by Allen Derry, PA-C. With the following instrument(s): Curette to remove Viable and Non-Viable tissue/material. Material removed includes Callus, Subcutaneous  Tissue, Skin: Dermis, and Skin: Epidermis. No specimens were taken. A time out was conducted at 09:50, prior to the start of the procedure. A Minimum amount of bleeding was controlled with Pressure. The procedure was tolerated well with a pain level of 0 throughout and a pain level of 0 following the procedure. Post Debridement Measurements: 2.3cm length x 3cm width x 0.4cm depth; 2.168cm^3 volume. Character of Wound/Ulcer Post Debridement is improved. Severity of Tissue Post Debridement is: Fat layer exposed. Post procedure Diagnosis Wound #3: Same as Pre-Procedure Plan Follow-up Appointments: Return Appointment in 1 week. Bathing/ Shower/ Hygiene: May shower with wound dressing protected with water repellent cover or cast protector. Edema Control - Orders / Instructions: Elevate, Exercise Daily and Avoid Standing for Long Periods of Time. Elevate legs to the level of the heart and pump ankles as often as possible Elevate leg(s) parallel to the floor when sitting. Consults ordered were: Vascular - eval for blood flow enough for wound healing WOUND #1: - T Great Wound Laterality: Right oe Cleanser: Byram Ancillary Kit - 15 Day Supply (DME) (Generic) 3 x Per Week/30 Days Discharge Instructions: Use supplies as instructed; Kit contains: (15) Saline Bullets; (15) 3x3 Gauze; 15 pr Gloves Cleanser: Soap and Water 3 x Per Week/30 Days Discharge Instructions: Gently cleanse wound with antibacterial soap, rinse and pat dry prior to dressing wounds Prim Dressing: IODOFLEX 0.9% Cadexomer Iodine Pad (DME) (Generic) 3 x Per Week/30 Days ary Discharge Instructions: Apply Iodoflex to wound bed only as directed. Secondary Dressing: (BORDER) Zetuvit Plus SILICONE BORDER Dressing 4x4 (in/in) (DME) (Generic) 3 x Per Week/30 Days Discharge Instructions: Please do not put silicone bordered dressings under wraps. Use non-bordered dressing only. WOUND #2: - Metatarsal head fifth Wound Laterality:  Right Cleanser: Byram Ancillary Kit - 15 Day Supply (DME) (Generic) 3 x Per Week/30 Days Discharge Instructions: Use supplies as instructed; Kit contains: (15) Saline Bullets; (15) 3x3 Gauze; 15 pr Gloves Cleanser: Soap and Water 3 x Per Week/30 Days Discharge Instructions: Gently cleanse wound with antibacterial soap, rinse and pat dry prior to dressing wounds Prim Dressing: IODOFLEX 0.9% Cadexomer Iodine Pad (DME) (Generic) 3 x Per Week/30 Days ary Discharge Instructions: Apply Iodoflex to wound bed only as directed. Secondary Dressing: (BORDER) Zetuvit Plus SILICONE BORDER Dressing 4x4 (in/in) (DME) (Generic) 3 x Per Week/30 Days Discharge Instructions: Please do not put silicone bordered dressings under wraps. Use non-bordered dressing only. WOUND #3: - Foot Wound Laterality: Left Cleanser: Byram Ancillary Kit - 15 Day Supply (DME) (Generic) 3 x Per Week/30 Days Discharge Instructions: Use supplies as instructed; Kit contains: (15) Saline Bullets; (15) 3x3 Gauze; 15 pr Gloves Cleanser: Soap and Water 3 x Per Week/30 Days Discharge Instructions: Gently cleanse wound with antibacterial soap, rinse and pat dry prior to dressing wounds Prim Dressing: IODOFLEX 0.9% Cadexomer Iodine Pad (DME) (Generic) 3 x Per  Week/30 Days ary Discharge Instructions: Apply Iodoflex to wound bed only as directed. Secondary Dressing: (BORDER) Zetuvit Plus SILICONE BORDER Dressing 4x4 (in/in) (DME) (Generic) 3 x Per Week/30 Days Discharge Instructions: Please do not put silicone bordered dressings under wraps. Use non-bordered dressing only. 1. I would recommend based on what we are seeing that we have the patient go ahead and continue to monitor for any signs of infection or worsening right now again there is no obvious signs of infection at the moment but at the same time he is at high risk for this. His CT scans as of October were negative for osteomyelitis. 2. I am going to recommend that we had the patient  initiate treatment with Iodoflex for all wounds. 3. And also can recommend that we use a bordered foam dressing to cover over top of the wounds. 4. I am also going to suggest that he should try to stay off of his feet is much as possible as could be difficult to get this to heal as it stands and any pressure he puts on the can make it is not much worse. 5. I am also going to recommend based on what we are seeing that we have him go back to see vascular in order to evaluate for his arterial flow and anything that may need to be done. He is in agreement with that plan. We will see patient back for reevaluation in 1 week here in the clinic. If anything worsens or changes patient will contact our office for additional recommendations. Electronic Signature(s) Signed: 09/09/2023 10:32:01 AM By: Tyler [name], Tyler [name] ([id number]) [id number] Page 11 of 13 Entered By: Allen Derry on 09/09/2023 10:32:01 -------------------------------------------------------------------------------- ROS/PFSH Details Patient Name: Date of Service: Tyler Saner [place] A. 09/06/2023 8:45 A M Medical Record Number: [id number] Patient Account Number: [id number] Date of Birth/Sex: Treating RN: November 22, 1945 (77 y.o. Tyler [name]) Yevonne Pax Primary Care Provider: [facility] Fernande Boyden Other Clinician: Referring Provider: Treating Provider/Extender: Baxter Kail Weeks in Treatment: 0 Integumentary (Skin) Complaints and Symptoms: Positive for: Wounds Hematologic/Lymphatic Medical History: Positive for: Lymphedema Cardiovascular Medical History: Positive for: Congestive Heart Failure; Hypertension; Peripheral Arterial Disease Endocrine Medical History: Positive for: Type II Diabetes Time with diabetes: 5 years Treated with: Oral agents Genitourinary Complaints and Symptoms: Review of System Notes: CKD Immunizations Pneumococcal Vaccine: Received Pneumococcal Vaccination:  No Implantable Devices Yes Family and Social History Never smoker; Marital Status - Divorced; Alcohol Use: Rarely; Drug Use: No History; Caffeine Use: Rarely Advanced Directives and Instructions Electronic Signature(s) Signed: 09/06/2023 12:55:12 PM By: Allen Derry PA-C Signed: 09/09/2023 3:34:19 PM By: Yevonne Pax RN Entered By: Yevonne Pax on 09/06/2023 08:54:42 Tyler [name], Tyler [name] ([id number]) [id number] Page 12 of 13 -------------------------------------------------------------------------------- SuperBill Details Patient Name: Date of Service: Tyler Saner [place] A. 09/06/2023 Medical Record Number: [id number] Patient Account Number: [id number] Date of Birth/Sex: Treating RN: 07/21/1946 (77 y.o. Tyler [name]) Yevonne Pax Primary Care Provider: [facility] Fernande Boyden Other Clinician: Referring Provider: Treating Provider/Extender: Baxter Kail Weeks in Treatment: 0 Diagnosis Coding ICD-10 Codes Code Description E11.621 Type 2 diabetes mellitus with foot ulcer L97.512 Non-pressure chronic ulcer of other part of right foot with fat layer exposed L97.522 Non-pressure chronic ulcer of other part of left foot with fat layer exposed I89.0 Lymphedema, not elsewhere classified I50.42 Chronic combined systolic (congestive) and diastolic (congestive) heart failure I10 Essential (primary) hypertension I48.0 Paroxysmal atrial fibrillation I45.5 Other specified heart block N18.30 Chronic kidney disease, stage  3 unspecified Facility Procedures : CPT4 Code: [id number] 1 Description: 1042 - DEB SUBQ TISSUE 20 SQ CM/< ICD-10 Diagnosis Description L97.512 Non-pressure chronic ulcer of other part of right foot with fat layer exposed L97.522 Non-pressure chronic ulcer of other part of left foot with fat layer exposed Modifier: Quantity: 1 : CPT4 Code: [id number] 1 Description: 1044 - DEB BONE 20 SQ CM/< ICD-10 Diagnosis Description L97.512 Non-pressure chronic ulcer of other  part of right foot with fat layer exposed L97.522 Non-pressure chronic ulcer of other part of left foot with fat layer exposed Modifier: Quantity: 1 Physician Procedures : CPT4: Description Modifier Code [id number] 99204 - WC PHYS LEVEL 4 - NEW PT 25 ICD-10 Diagnosis Description E11.621 Type 2 diabetes mellitus with foot ulcer L97.512 Non-pressure chronic ulcer of other part of right foot with fat layer exposed L97.522  Non-pressure chronic ulcer of other part of left foot with fat layer exposed I89.0 Lymphedema, not elsewhere classified Quantity: 1 : CPT4: 11042 11042 - WC PHYS SUBQ TISS 20 SQ CM ICD-10 Diagnosis Description L97.512 Non-pressure chronic ulcer of other part of right foot with fat layer exposed L97.522 Non-pressure chronic ulcer of other part of left foot with fat layer exposed Quantity: 1 : [place], CPT4: 2536644 Debridement; bone (includes epidermis, dermis, subQ tissue, muscle and/or fascia, if performed) 1st 20 sqcm or less ICD-10 Diagnosis Description L97.512 Non-pressure chronic ulcer of other part of right foot with fat layer exposed L97.522  Non-pressure chronic ulcer of other part of left foot with fat layer exposed Tyler [name] ([id number]) [id number] Page 1 Quantity: 1 3 of 13 Electronic Signature(s) Signed: 09/09/2023 10:32:30 AM By: Allen Derry PA-C Entered By: Allen Derry on 09/09/2023 10:32:29

## 2023-09-10 ENCOUNTER — Telehealth: Payer: Self-pay | Admitting: Family

## 2023-09-10 NOTE — Progress Notes (Unsigned)
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   Cardiac history dates back to 06/21 when he was first diagnosed with HF. Had also recently been diagnosed with atrial fibrillation.   Admitted 10/30/22 due to infection of right third toe. Noted his right third toe turning dark few days ago and complained of pain in the affected area as well as fever. Right foot x-ray showed soft tissue edema overlies the dorsum of the foot. No soft tissue gas or radiopaque foreign body. No radiographic findings of osteomyelitis. IV antibiotics given. ABI abn. Vascular consulted. RLE angiography done. Amputation of R 3rd toe done. Post-op had significant epistaxis L nare requiring insertion rhino rocket packing overnight, holding heparin/ASA. Restarted at discharge. Admitted 07/02/23 due to shortness of breath and worsening leg edema. Chronic diabetic foot ulcers. Found to have cellulitis involving the right lower extremity along with an elevated BNP of 1066. Patient was seen by podiatrist and underwent bilateral lower extremity imaging however they did not show any evidence of underlying osteomyelitis. Cardiology consulted. IV diuresed with transition to oral diuretics. Was in the ED 07/19/23 due to mechanical fall where he slipped and hit his knees on the cabinet. No head injury or LOC.    Was in the ED 08/03/23 due to shortness of breath and worsening pedal edema. CXR  with pulmonary edema. No STEMI. Lasix provided. Admitted 08/13/23 due to acute  worsening of his SOB with associated orthopnea and PND. Cardiology consulted. IV lasix given and then transitioned to an increased oral lasix dose. Troponins 100's flat.   Echo 09/20/20: EF 55-60% with 3+ TR Echo 07/03/23: EF 30-35% with moderate Tyler  He presents today for a follow-up HF visit with a chief complaint of moderate shortness of breath with little exertion. Reports that he feel like his breathing is better than last week. Has associated fatigue, dry cough (worsening), abdominal distention, pedal edema (worsening), leg pain & weight gain along with this. Reports sleeping better on 2-3 pillows. Denies chest pain or palpitations.   Since last visit, he was given 80mg  IV lasix twice and says that he doesn't feel like he got a very good response from that. Was unable to tolerate entresto/ spironolactone due to dizziness.   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 Does not add salt to his food.   He's unclear of daily fluid intake but estimates that he drinks 3-4 water bottles / day and 1 bottle of armour / day.   ROS: All systems negative except as listed in HPI, PMH and Problem List.  SH:  Social History   Socioeconomic History   Marital status: Single    Spouse name: Not on file   Number of children: Not on file   Years of education: Not on file   Highest education level: Not on file  Occupational History    Comment: [employer]  Tobacco Use   Smoking status: Never   Smokeless tobacco: Never  Vaping Use   Vaping status: Never Used  Substance and Sexual Activity   Alcohol use: Not Currently  Comment: rarely   Drug use: Never   Sexual activity: Not Currently    Birth control/protection: None  Other Topics Concern   Not on file  Social History Narrative   Lives with roommate   Social Drivers of Health   Financial Resource Strain: Low Risk  (03/20/2023)   Received from [facility], [facility]   Overall Financial Resource Strain (CARDIA)    Difficulty of Paying  Living Expenses: Not hard at all  Food Insecurity: No Food Insecurity (08/14/2023)   Hunger Vital Sign    Worried About Running Out of Food in the Last Year: Never true    Ran Out of Food in the Last Year: Never true  Transportation Needs: No Transportation Needs (08/14/2023)   PRAPARE - Administrator, Civil Service (Medical): No    Lack of Transportation (Non-Medical): No  Physical Activity: Not on file  Stress: No Stress Concern Present (09/07/2020)   Received from [facility], [facility] - Occupational Stress Questionnaire    Feeling of Stress : Not at all  Social Connections: Unknown (02/01/2022)   Received from [facility], [facility]   Social Network    Social Network: Not on file  Intimate Partner Violence: Not At Risk (08/14/2023)   Humiliation, Afraid, Rape, and Kick questionnaire    Fear of Current or Ex-Partner: No    Emotionally Abused: No    Physically Abused: No    Sexually Abused: No    FH:  Family History  Problem Relation Age of Onset   Cancer Niece     Past Medical History:  Diagnosis Date   (HFpEF) heart failure with preserved ejection fraction (HCC) 03/01/2020   a.) TTE 03/01/2020: EF 55-60%, mod MAC, mod AoV sclerosis, triv AR, mild TR, mod Tyler, RVSP 50-59; b.) TTE 09/10/2020: EF 55-60%, mod MAC, mod AoV sclerosis, mild TR, 3+ Tyler, RVSP 50-59; c.) TTE 09/26/2020: EF 55-60%, mild LA dil, triv PR, mild Tyler/TR, RVSP 37-49   Adenoma of left adrenal gland    Anemia    Arthritis    Atrial fibrillation and flutter (HCC)    a.) CHA2DS2VASc = 5 (age x2, HFpEF, HTN, T2DM);  b.) s/p CTI ablation 09/07/2020; c.) rate/rhythm maintained on oral carvedilol; not on chronic anticoagulation therapy   CAD (coronary artery disease)    Cardiomegaly    CKD (chronic kidney disease), stage III (HCC)    DOE (dyspnea on exertion)    Drug-induced bradycardia    Gangrene of toe of left foot (HCC)    a.) s/p  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    HLD (hyperlipidemia)    Hypertension    Long term current use of aspirin    Lymphedema of both lower extremities    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)    a.) s/p amputation 11/04/2022   Peripheral vascular disease (HCC)    Pleural effusion on right 09/09/2020   a.) s/p RIGHT thoracentesis with 2180 cc yield   Pneumonia    Presence of permanent cardiac pacemaker 09/10/2020   a.) TVP placement 09/10/2020 due to intermittent CHB in setting of urosepsis; b.) s/p PPM placement 09/15/2020: MDT Azure XT DR (SN: [id number])   Pulmonary hypertension (HCC) 03/01/2020   a.) TTE: 03/01/2020: RVSP 50-59; b.) TTE 09/10/2020: RVSP 50-59; c.) TTE 09/26/2020: RVSP 37-49   RA (rheumatoid arthritis) (HCC)  Rheumatic fever    Sepsis (HCC) 09/10/2020   a.) urosepsis --> BC x 2 sets and UC all grew out significant Proteus mirabilis; admitted to [facility] 09/07/2020 - 09/27/2020.   Sick sinus syndrome [facility])    a.) s/p MDT PPM placement 09/15/2020   T2DM (type 2 diabetes mellitus) (HCC)    Urinary retention    chronic, with indwelling Foley catheter and plans for a suprapubic   Wears dentures    full upper    Current Outpatient Medications  Medication Sig Dispense Refill   acetaminophen (TYLENOL) 500 MG tablet Take 2 tablets (1,000 mg total) by mouth 3 (three) times daily as needed (for pain).     aspirin EC 81 MG tablet Take 1 tablet (81 mg total) by mouth daily. Swallow whole. 30 tablet 0   Finerenone (KERENDIA) 10 MG TABS Take 1 tablet (10 mg total) by mouth daily. 30 tablet 2   furosemide (LASIX) 20 MG tablet Take 3 tablets (60 mg total) by mouth daily. 90 tablet 2   gentamicin cream (GARAMYCIN) 0.1 % Apply 1 Application topically 2 (two) times daily. 30 g 1   glipiZIDE (GLUCOTROL) 10 MG tablet Take 1 tablet (10 mg total) by mouth 2 (two) times daily. 60 tablet 2    metFORMIN (GLUCOPHAGE) 1000 MG tablet Take 1 tablet (1,000 mg total) by mouth 2 (two) times daily. 60 tablet 2   metolazone (ZAROXOLYN) 2.5 MG tablet Take 1 tablet (2.5 mg total) by mouth once a week. On Mondays (Patient taking differently: Take 2.5 mg by mouth 2 (two) times a week. On Mondays & Fridays) 4 tablet 2   metoprolol succinate (TOPROL-XL) 50 MG 24 hr tablet Take 1 tablet (50 mg total) by mouth daily. Take with or immediately following a meal. 30 tablet 2   Multiple Vitamins-Minerals (MULTIVITAMIN WITH MINERALS) tablet Take 1 tablet by mouth daily.     OVER THE COUNTER MEDICATION 1 Scoop daily. Super Beets Powder     sacubitril-valsartan (ENTRESTO) 24-26 MG Take 1 tablet by mouth 2 (two) times daily. (Patient not taking: Reported on 09/03/2023) 60 tablet 2   silver sulfADIAZINE (SILVADENE) 1 % cream Apply to affected area daily 50 g 1   No current facility-administered medications for this visit.   Vitals:   09/11/23 0929  BP: 121/68  Pulse: 81  Resp: (!) 22  SpO2: 99%  Weight: 260 lb 2 oz (118 kg)   Wt Readings from Last 3 Encounters:  09/11/23 260 lb 2 oz (118 kg)  09/03/23 257 lb (116.6 kg)  08/26/23 255 lb 2 oz (115.7 kg)   Lab Results  Component Value Date   CREATININE 1.54 (H) 09/06/2023   CREATININE 1.56 (H) 09/03/2023   CREATININE 1.71 (H) 08/26/2023   PHYSICAL EXAM:  General:  Well appearing. No resp difficulty HEENT: normal Neck: supple. JVP flat. No lymphadenopathy or thryomegaly appreciated. Cor: PMI normal. Regular rhythm & rate, No rubs, gallops or murmurs. Lungs: clear Abdomen: soft, nontender, distended. No hepatosplenomegaly. No bruits or masses.  Extremities: no cyanosis, clubbing, rash, 3+ pitting edema bilateral lower legs Neuro: alert & oriented x3, cranial nerves grossly intact. Moves all 4 extremities w/o difficulty. Affect pleasant.   ECG: v-paced with HR 77, unchanged from 11/24 EKG (personally reviewed)  ReDs: 60%; last week it was 48%     ASSESSMENT & PLAN:  1: NICM with reduced ejection fraction- - suspect due to HTN/ atrial fibrillation - NYHA class III/ IV (SOB when talking) -  fluid overload with elevated ReDs reading & continued symptoms - weighing daily; reminded to call for overnight weight gain of > 2 pounds or a weekly weight gain of > 5 pounds - weight up 3 pounds from last visit here 1 week ago - ReDs 60% but question accuracy due to chest wall shape - received 80mg  IV lasix twice last week but says that he doesn't think it helped  - Echo 09/20/20: EF 55-60% with 3+ TR - Echo 07/03/23: EF 30-35% with moderate Tyler - stop furosemide and begin bumex 2mg  daily - BMET today and, again, next visit - continue kerendia 10mg  daily - continue furosemide 60mg  daily - continue metolazone 2.5mg  weekly on Monday & Friday - continue metoprolol succinate 50mg  daily - patient stopped entresto due to dizziness - previous spironolactone use caused dizziness - not a candidate for SGLT2 due to chronic foley - BNP 09/06/23 was 1739.5 - BNP today  2: HTN- - BP 121/68 - saw PCP (McClanahan) 11/23 - BMP 09/06/23 showed sodium 136, potassium 4.1, creatinine 1.54 & GFR 46 - BMET today  3: Atrial fibrillation- - saw [facility] 06/[facility]; to get established with Dr. Darrold Junker, his office number was provided, again, today and emphasized that he call to get an appt scheduled - ablation 09/07/20 - continue ASA 81mg  - eliquis caused dizziness & vision changes  4: DM- - A1c 07/02/23 was 7.2% - continue glipizide 10mg  BID - continue metformin 1000mg  BID  5: Mobitz type 2 AV block- - DDD MDT PPM placed 09/15/20  - <0.1% time in AT/AF - atrial impedance 437 ohms - RV impedance 532 ohms  6: Lymphedema- - stage 2 - limited in his ability to exercise due to edema - tried compression socks in the past but they became too tight and cut into his legs - edema persists even with elevation in the bed overnight - went to  the wound center 12/24 - may benefit from compression pumps   Should symptoms worsen, he needs to present to the ER, otherwise, return in 2 weeks. Sooner if needed. Patient verbalized understanding.

## 2023-09-10 NOTE — Telephone Encounter (Signed)
Pt confirmed appt for 09/11/23

## 2023-09-11 ENCOUNTER — Inpatient Hospital Stay
Admission: EM | Admit: 2023-09-11 | Discharge: 2023-09-21 | DRG: 463 | Disposition: A | Discharge status: Home / Self Care | Payer: BC Managed Care – PPO | Attending: Internal Medicine | Admitting: Internal Medicine

## 2023-09-11 ENCOUNTER — Emergency Department: Payer: BC Managed Care – PPO

## 2023-09-11 ENCOUNTER — Other Ambulatory Visit
Admission: RE | Admit: 2023-09-11 | Discharge: 2023-09-11 | Disposition: A | Discharge status: Home / Self Care | Payer: BC Managed Care – PPO | Source: Ambulatory Visit | Attending: Family | Admitting: Family

## 2023-09-11 ENCOUNTER — Other Ambulatory Visit: Payer: Self-pay

## 2023-09-11 ENCOUNTER — Ambulatory Visit (HOSPITAL_BASED_OUTPATIENT_CLINIC_OR_DEPARTMENT_OTHER): Payer: BC Managed Care – PPO | Admitting: Family

## 2023-09-11 ENCOUNTER — Encounter: Payer: Self-pay | Admitting: Family

## 2023-09-11 VITALS — BP 121/68 | HR 81 | Resp 22 | Wt 260.1 lb

## 2023-09-11 DIAGNOSIS — M7989 Other specified soft tissue disorders: Secondary | ICD-10-CM | POA: Diagnosis not present

## 2023-09-11 DIAGNOSIS — I70235 Atherosclerosis of native arteries of right leg with ulceration of other part of foot: Secondary | ICD-10-CM | POA: Diagnosis not present

## 2023-09-11 DIAGNOSIS — I130 Hypertensive heart and chronic kidney disease with heart failure and stage 1 through stage 4 chronic kidney disease, or unspecified chronic kidney disease: Secondary | ICD-10-CM | POA: Insufficient documentation

## 2023-09-11 DIAGNOSIS — I4819 Other persistent atrial fibrillation: Secondary | ICD-10-CM | POA: Diagnosis not present

## 2023-09-11 DIAGNOSIS — Z7982 Long term (current) use of aspirin: Secondary | ICD-10-CM | POA: Insufficient documentation

## 2023-09-11 DIAGNOSIS — I340 Nonrheumatic mitral (valve) insufficiency: Secondary | ICD-10-CM | POA: Diagnosis present

## 2023-09-11 DIAGNOSIS — L97529 Non-pressure chronic ulcer of other part of left foot with unspecified severity: Secondary | ICD-10-CM | POA: Diagnosis not present

## 2023-09-11 DIAGNOSIS — I441 Atrioventricular block, second degree: Secondary | ICD-10-CM | POA: Insufficient documentation

## 2023-09-11 DIAGNOSIS — Z950 Presence of cardiac pacemaker: Secondary | ICD-10-CM | POA: Diagnosis not present

## 2023-09-11 DIAGNOSIS — Z7984 Long term (current) use of oral hypoglycemic drugs: Secondary | ICD-10-CM | POA: Insufficient documentation

## 2023-09-11 DIAGNOSIS — N1831 Chronic kidney disease, stage 3a: Secondary | ICD-10-CM | POA: Diagnosis not present

## 2023-09-11 DIAGNOSIS — I5023 Acute on chronic systolic (congestive) heart failure: Secondary | ICD-10-CM | POA: Diagnosis present

## 2023-09-11 DIAGNOSIS — Z79899 Other long term (current) drug therapy: Secondary | ICD-10-CM | POA: Insufficient documentation

## 2023-09-11 DIAGNOSIS — E1151 Type 2 diabetes mellitus with diabetic peripheral angiopathy without gangrene: Secondary | ICD-10-CM | POA: Diagnosis not present

## 2023-09-11 DIAGNOSIS — M00071 Staphylococcal arthritis, right ankle and foot: Principal | ICD-10-CM | POA: Diagnosis present

## 2023-09-11 DIAGNOSIS — D631 Anemia in chronic kidney disease: Secondary | ICD-10-CM | POA: Diagnosis not present

## 2023-09-11 DIAGNOSIS — R319 Hematuria, unspecified: Secondary | ICD-10-CM | POA: Diagnosis present

## 2023-09-11 DIAGNOSIS — M19072 Primary osteoarthritis, left ankle and foot: Secondary | ICD-10-CM | POA: Diagnosis not present

## 2023-09-11 DIAGNOSIS — Z89421 Acquired absence of other right toe(s): Secondary | ICD-10-CM | POA: Diagnosis not present

## 2023-09-11 DIAGNOSIS — I10 Essential (primary) hypertension: Secondary | ICD-10-CM | POA: Diagnosis present

## 2023-09-11 DIAGNOSIS — M869 Osteomyelitis, unspecified: Secondary | ICD-10-CM

## 2023-09-11 DIAGNOSIS — I509 Heart failure, unspecified: Principal | ICD-10-CM

## 2023-09-11 DIAGNOSIS — J9601 Acute respiratory failure with hypoxia: Secondary | ICD-10-CM | POA: Diagnosis not present

## 2023-09-11 DIAGNOSIS — M069 Rheumatoid arthritis, unspecified: Secondary | ICD-10-CM | POA: Diagnosis not present

## 2023-09-11 DIAGNOSIS — I878 Other specified disorders of veins: Secondary | ICD-10-CM | POA: Diagnosis present

## 2023-09-11 DIAGNOSIS — I272 Pulmonary hypertension, unspecified: Secondary | ICD-10-CM | POA: Diagnosis not present

## 2023-09-11 DIAGNOSIS — Z7401 Bed confinement status: Secondary | ICD-10-CM

## 2023-09-11 DIAGNOSIS — E1122 Type 2 diabetes mellitus with diabetic chronic kidney disease: Secondary | ICD-10-CM | POA: Insufficient documentation

## 2023-09-11 DIAGNOSIS — J189 Pneumonia, unspecified organism: Secondary | ICD-10-CM | POA: Diagnosis not present

## 2023-09-11 DIAGNOSIS — I2510 Atherosclerotic heart disease of native coronary artery without angina pectoris: Secondary | ICD-10-CM | POA: Insufficient documentation

## 2023-09-11 DIAGNOSIS — L03115 Cellulitis of right lower limb: Secondary | ICD-10-CM | POA: Diagnosis present

## 2023-09-11 DIAGNOSIS — M86171 Other acute osteomyelitis, right ankle and foot: Secondary | ICD-10-CM | POA: Diagnosis not present

## 2023-09-11 DIAGNOSIS — I890 Lymphedema, not elsewhere classified: Secondary | ICD-10-CM | POA: Insufficient documentation

## 2023-09-11 DIAGNOSIS — I428 Other cardiomyopathies: Secondary | ICD-10-CM

## 2023-09-11 DIAGNOSIS — R339 Retention of urine, unspecified: Secondary | ICD-10-CM | POA: Diagnosis present

## 2023-09-11 DIAGNOSIS — I110 Hypertensive heart disease with heart failure: Secondary | ICD-10-CM | POA: Diagnosis not present

## 2023-09-11 DIAGNOSIS — L97519 Non-pressure chronic ulcer of other part of right foot with unspecified severity: Secondary | ICD-10-CM | POA: Diagnosis present

## 2023-09-11 DIAGNOSIS — E1140 Type 2 diabetes mellitus with diabetic neuropathy, unspecified: Secondary | ICD-10-CM | POA: Diagnosis present

## 2023-09-11 DIAGNOSIS — Z1152 Encounter for screening for COVID-19: Secondary | ICD-10-CM | POA: Diagnosis not present

## 2023-09-11 DIAGNOSIS — R609 Edema, unspecified: Secondary | ICD-10-CM | POA: Insufficient documentation

## 2023-09-11 DIAGNOSIS — Z89412 Acquired absence of left great toe: Secondary | ICD-10-CM

## 2023-09-11 DIAGNOSIS — L97422 Non-pressure chronic ulcer of left heel and midfoot with fat layer exposed: Secondary | ICD-10-CM | POA: Diagnosis not present

## 2023-09-11 DIAGNOSIS — E785 Hyperlipidemia, unspecified: Secondary | ICD-10-CM | POA: Insufficient documentation

## 2023-09-11 DIAGNOSIS — J90 Pleural effusion, not elsewhere classified: Secondary | ICD-10-CM

## 2023-09-11 DIAGNOSIS — I70245 Atherosclerosis of native arteries of left leg with ulceration of other part of foot: Secondary | ICD-10-CM | POA: Diagnosis not present

## 2023-09-11 DIAGNOSIS — I480 Paroxysmal atrial fibrillation: Secondary | ICD-10-CM | POA: Diagnosis not present

## 2023-09-11 DIAGNOSIS — Z794 Long term (current) use of insulin: Secondary | ICD-10-CM

## 2023-09-11 DIAGNOSIS — E1169 Type 2 diabetes mellitus with other specified complication: Secondary | ICD-10-CM | POA: Diagnosis present

## 2023-09-11 DIAGNOSIS — I5022 Chronic systolic (congestive) heart failure: Secondary | ICD-10-CM | POA: Diagnosis not present

## 2023-09-11 DIAGNOSIS — R040 Epistaxis: Secondary | ICD-10-CM | POA: Diagnosis present

## 2023-09-11 DIAGNOSIS — I442 Atrioventricular block, complete: Secondary | ICD-10-CM | POA: Diagnosis present

## 2023-09-11 DIAGNOSIS — I517 Cardiomegaly: Secondary | ICD-10-CM | POA: Diagnosis not present

## 2023-09-11 DIAGNOSIS — M86471 Chronic osteomyelitis with draining sinus, right ankle and foot: Secondary | ICD-10-CM | POA: Diagnosis not present

## 2023-09-11 DIAGNOSIS — E11622 Type 2 diabetes mellitus with other skin ulcer: Secondary | ICD-10-CM | POA: Diagnosis not present

## 2023-09-11 DIAGNOSIS — M868X7 Other osteomyelitis, ankle and foot: Secondary | ICD-10-CM | POA: Diagnosis present

## 2023-09-11 DIAGNOSIS — M19071 Primary osteoarthritis, right ankle and foot: Secondary | ICD-10-CM | POA: Diagnosis not present

## 2023-09-11 DIAGNOSIS — E1165 Type 2 diabetes mellitus with hyperglycemia: Secondary | ICD-10-CM | POA: Diagnosis present

## 2023-09-11 DIAGNOSIS — E11621 Type 2 diabetes mellitus with foot ulcer: Secondary | ICD-10-CM | POA: Diagnosis not present

## 2023-09-11 DIAGNOSIS — R0602 Shortness of breath: Secondary | ICD-10-CM | POA: Diagnosis not present

## 2023-09-11 DIAGNOSIS — N183 Chronic kidney disease, stage 3 unspecified: Secondary | ICD-10-CM | POA: Insufficient documentation

## 2023-09-11 DIAGNOSIS — L089 Local infection of the skin and subcutaneous tissue, unspecified: Secondary | ICD-10-CM | POA: Diagnosis not present

## 2023-09-11 DIAGNOSIS — Z7901 Long term (current) use of anticoagulants: Secondary | ICD-10-CM | POA: Insufficient documentation

## 2023-09-11 DIAGNOSIS — R918 Other nonspecific abnormal finding of lung field: Secondary | ICD-10-CM | POA: Diagnosis not present

## 2023-09-11 DIAGNOSIS — B9562 Methicillin resistant Staphylococcus aureus infection as the cause of diseases classified elsewhere: Secondary | ICD-10-CM | POA: Diagnosis present

## 2023-09-11 DIAGNOSIS — L98494 Non-pressure chronic ulcer of skin of other sites with necrosis of bone: Secondary | ICD-10-CM | POA: Diagnosis not present

## 2023-09-11 DIAGNOSIS — Z888 Allergy status to other drugs, medicaments and biological substances status: Secondary | ICD-10-CM

## 2023-09-11 DIAGNOSIS — M009 Pyogenic arthritis, unspecified: Secondary | ICD-10-CM | POA: Diagnosis not present

## 2023-09-11 DIAGNOSIS — N189 Chronic kidney disease, unspecified: Secondary | ICD-10-CM

## 2023-09-11 DIAGNOSIS — J988 Other specified respiratory disorders: Secondary | ICD-10-CM

## 2023-09-11 DIAGNOSIS — T82856A Stenosis of peripheral vascular stent, initial encounter: Secondary | ICD-10-CM | POA: Diagnosis not present

## 2023-09-11 DIAGNOSIS — M86671 Other chronic osteomyelitis, right ankle and foot: Secondary | ICD-10-CM | POA: Diagnosis not present

## 2023-09-11 DIAGNOSIS — J9600 Acute respiratory failure, unspecified whether with hypoxia or hypercapnia: Secondary | ICD-10-CM | POA: Diagnosis not present

## 2023-09-11 LAB — BASIC METABOLIC PANEL
Anion gap: 10 (ref 5–15)
Anion gap: 9 (ref 5–15)
BUN: 56 mg/dL — ABNORMAL HIGH (ref 8–23)
BUN: 54 mg/dL — ABNORMAL HIGH (ref 8–23)
CO2: 21 mmol/L — ABNORMAL LOW (ref 22–32)
CO2: 21 mmol/L — ABNORMAL LOW (ref 22–32)
Calcium: 8.8 mg/dL — ABNORMAL LOW (ref 8.9–10.3)
Calcium: 8.9 mg/dL (ref 8.9–10.3)
Chloride: 105 mmol/L (ref 98–111)
Chloride: 106 mmol/L (ref 98–111)
Creatinine, Ser: 1.65 mg/dL — ABNORMAL HIGH (ref 0.61–1.24)
Creatinine, Ser: 1.55 mg/dL — ABNORMAL HIGH (ref 0.61–1.24)
GFR, Estimated: 42 mL/min — ABNORMAL LOW (ref >60)
GFR, Estimated: 45 mL/min — ABNORMAL LOW (ref >60)
Glucose, Bld: 241 mg/dL — ABNORMAL HIGH (ref 70–99)
Glucose, Bld: 209 mg/dL — ABNORMAL HIGH (ref 70–99)
Potassium: 5 mmol/L (ref 3.5–5.1)
Potassium: 5 mmol/L (ref 3.5–5.1)
Sodium: 136 mmol/L (ref 135–145)
Sodium: 136 mmol/L (ref 135–145)

## 2023-09-11 LAB — TROPONIN I (HIGH SENSITIVITY)
Troponin I (High Sensitivity): 18 ng/L — ABNORMAL HIGH (ref <18)
Troponin I (High Sensitivity): 22 ng/L — ABNORMAL HIGH (ref <18)

## 2023-09-11 LAB — PROCALCITONIN: Procalcitonin: 0.12 ng/mL

## 2023-09-11 LAB — CBC WITH DIFFERENTIAL/PLATELET
Abs Immature Granulocytes: 0.07 10*3/uL (ref 0.00–0.07)
Basophils Absolute: 0.1 10*3/uL (ref 0.0–0.1)
Basophils Relative: 0 %
Eosinophils Absolute: 0.2 10*3/uL (ref 0.0–0.5)
Eosinophils Relative: 2 %
HCT: 33.8 % — ABNORMAL LOW (ref 39.0–52.0)
Hemoglobin: 10.4 g/dL — ABNORMAL LOW (ref 13.0–17.0)
Immature Granulocytes: 1 %
Lymphocytes Relative: 8 %
Lymphs Abs: 0.9 10*3/uL (ref 0.7–4.0)
MCH: 27.5 pg (ref 26.0–34.0)
MCHC: 30.8 g/dL (ref 30.0–36.0)
MCV: 89.4 fL (ref 80.0–100.0)
Monocytes Absolute: 0.7 10*3/uL (ref 0.1–1.0)
Monocytes Relative: 7 %
Neutro Abs: 9.4 10*3/uL — ABNORMAL HIGH (ref 1.7–7.7)
Neutrophils Relative %: 82 %
Platelets: 298 10*3/uL (ref 150–400)
RBC: 3.78 MIL/uL — ABNORMAL LOW (ref 4.22–5.81)
RDW: 16.3 % — ABNORMAL HIGH (ref 11.5–15.5)
WBC: 11.3 10*3/uL — ABNORMAL HIGH (ref 4.0–10.5)
nRBC: 0.2 % (ref 0.0–0.2)

## 2023-09-11 LAB — BRAIN NATRIURETIC PEPTIDE
B Natriuretic Peptide: 3491.5 pg/mL — ABNORMAL HIGH (ref 0.0–100.0)
B Natriuretic Peptide: 3874.2 pg/mL — ABNORMAL HIGH (ref 0.0–100.0)

## 2023-09-11 MED ORDER — FUROSEMIDE 10 MG/ML IJ SOLN
160.0000 mg | Freq: Once | INTRAVENOUS | Status: AC
  Administered 2023-09-11: 160 mg via INTRAVENOUS
  Filled 2023-09-11: qty 16

## 2023-09-11 MED ORDER — SODIUM CHLORIDE 0.9 % IV SOLN
500.0000 mg | Freq: Once | INTRAVENOUS | Status: AC
  Administered 2023-09-11: 500 mg via INTRAVENOUS
  Filled 2023-09-11: qty 5

## 2023-09-11 MED ORDER — ONDANSETRON HCL 4 MG/2ML IJ SOLN: 4.0000 mg | Freq: Four times a day (QID) | INTRAMUSCULAR | Status: DC | PRN

## 2023-09-11 MED ORDER — INSULIN ASPART 100 UNIT/ML IJ SOLN
0.0000 [IU] | Freq: Every day | INTRAMUSCULAR | Status: DC
  Administered 2023-09-18: 2 [IU] via SUBCUTANEOUS
  Filled 2023-09-11 (×3): qty 1

## 2023-09-11 MED ORDER — ENOXAPARIN SODIUM 60 MG/0.6ML IJ SOSY
0.5000 mg/kg | PREFILLED_SYRINGE | INTRAMUSCULAR | Status: DC
  Administered 2023-09-12 – 2023-09-18 (×8): 60 mg via SUBCUTANEOUS
  Filled 2023-09-11 (×8): qty 0.6

## 2023-09-11 MED ORDER — BUMETANIDE 2 MG PO TABS: 2.0000 mg | ORAL_TABLET | Freq: Every day | ORAL | 3 refills | Status: DC

## 2023-09-11 MED ORDER — SODIUM CHLORIDE 0.9 % IV SOLN
2.0000 g | Freq: Once | INTRAVENOUS | Status: AC
  Administered 2023-09-11: 2 g via INTRAVENOUS
  Filled 2023-09-11: qty 20

## 2023-09-11 MED ORDER — INSULIN ASPART 100 UNIT/ML IJ SOLN
0.0000 [IU] | Freq: Three times a day (TID) | INTRAMUSCULAR | Status: DC
  Administered 2023-09-12: 4 [IU] via SUBCUTANEOUS
  Administered 2023-09-12: 3 [IU] via SUBCUTANEOUS
  Administered 2023-09-12: 4 [IU] via SUBCUTANEOUS
  Administered 2023-09-13: 3 [IU] via SUBCUTANEOUS
  Administered 2023-09-13 – 2023-09-14 (×4): 4 [IU] via SUBCUTANEOUS
  Administered 2023-09-15: 7 [IU] via SUBCUTANEOUS
  Administered 2023-09-15 – 2023-09-18 (×10): 4 [IU] via SUBCUTANEOUS
  Administered 2023-09-19 (×2): 3 [IU] via SUBCUTANEOUS
  Administered 2023-09-19: 11 [IU] via SUBCUTANEOUS
  Administered 2023-09-20: 4 [IU] via SUBCUTANEOUS
  Administered 2023-09-20 (×2): 3 [IU] via SUBCUTANEOUS
  Administered 2023-09-21: 7 [IU] via SUBCUTANEOUS
  Administered 2023-09-21: 3 [IU] via SUBCUTANEOUS
  Filled 2023-09-11 (×27): qty 1

## 2023-09-11 MED ORDER — METOPROLOL SUCCINATE ER 50 MG PO TB24
50.0000 mg | ORAL_TABLET | Freq: Every day | ORAL | Status: DC
  Administered 2023-09-12 – 2023-09-21 (×9): 50 mg via ORAL
  Filled 2023-09-11 (×10): qty 1

## 2023-09-11 MED ORDER — ACETAMINOPHEN 650 MG RE SUPP: 650.0000 mg | Freq: Four times a day (QID) | RECTAL | Status: DC | PRN

## 2023-09-11 MED ORDER — FINERENONE 10 MG PO TABS: 10.0000 mg | ORAL_TABLET | Freq: Every day | ORAL | Status: DC

## 2023-09-11 MED ORDER — ONDANSETRON HCL 4 MG PO TABS: 4.0000 mg | ORAL_TABLET | Freq: Four times a day (QID) | ORAL | Status: DC | PRN

## 2023-09-11 MED ORDER — ASPIRIN 81 MG PO TBEC
81.0000 mg | DELAYED_RELEASE_TABLET | Freq: Every day | ORAL | Status: DC
  Administered 2023-09-12 – 2023-09-21 (×9): 81 mg via ORAL
  Filled 2023-09-11 (×10): qty 1

## 2023-09-11 MED ORDER — ACETAMINOPHEN 325 MG PO TABS
650.0000 mg | ORAL_TABLET | Freq: Four times a day (QID) | ORAL | Status: DC | PRN
  Administered 2023-09-12 – 2023-09-20 (×16): 650 mg via ORAL
  Filled 2023-09-11 (×17): qty 2

## 2023-09-11 NOTE — ED Provider Notes (Signed)
[facility] Provider Note    Event Date/Time   First MD Initiated Contact with Patient 09/11/23 1941     (approximate)   History   Chief Complaint: Shortness of Breath   HPI  Tyler Caylor Kannan is a 77 y.o. male with a history of CHF, atrial fibrillation, type 2 diabetes, who comes to the ED due to 10 pound weight gain over the last 2 weeks.  He has had an intermittent doses of IV Lasix without improvement.  He is tried increasing his dose of Lasix without improvement.  Continues to have worsening bilateral lower extremity edema as well as now developing orthopnea and severe dyspnea on exertion over the last few days.  Reports he can only walk about 5 feet before he has to stop.  Denies chest pain.  No fever.  Also notes wounds on bilateral feet.  Home wound care has started coming, wounds were dressed this morning.  He has peripheral neuropathy and both feet are insensate.          Physical Exam   Triage Vital Signs: ED Triage Vitals  Encounter Vitals Group     BP 09/11/23 1624 129/72     Systolic BP Percentile --      Diastolic BP Percentile --      Pulse Rate 09/11/23 1624 95     Resp 09/11/23 1624 (!) 22     Temp 09/11/23 1624 99 F (37.2 C)     Temp Source 09/11/23 1624 Oral     SpO2 09/11/23 1624 98 %     Weight 09/11/23 1625 260 lb (117.9 kg)     Height 09/11/23 1625 6\' 6"  (1.981 m)     Head Circumference --      Peak Flow --      Pain Score 09/11/23 1624 0     Pain Loc --      Pain Education --      Exclude from Growth Chart --     Most recent vital signs: Vitals:   09/11/23 1937 09/11/23 2013  BP: 132/86   Pulse: (!) 105   Resp: 19   Temp: 98.4 F (36.9 C)   SpO2: 96% 96%    General: Awake, no distress.  CV:  Good peripheral perfusion.  Regular rate rhythm.  Normal DP pulse bilaterally Resp:  Normal effort.  Diminished breath sounds bilateral bases. Abd:  No distention.  Soft nontender Other:  2+ pitting edema bilateral  lower extremities.  Skin changes of chronic venous stasis.  Right foot has a macerated soft tissue ulceration at the base of the first toe.  There is fibrinous debris in the wound.  Left foot has a large soft tissue ulcer in the plantar midfoot with fibrinous debris.  No gross purulent drainage.  No crepitus in either foot.  No significant surrounding induration or erythema.  No streaking.   ED Results / Procedures / Treatments   Labs (all labs ordered are listed, but only abnormal results are displayed) Labs Reviewed  BASIC METABOLIC PANEL - Abnormal; Notable for the following components:      Result Value   CO2 21 (*)    Glucose, Bld 209 (*)    BUN 54 (*)    Creatinine, Ser 1.55 (*)    GFR, Estimated 45 (*)    All other components within normal limits  BRAIN NATRIURETIC PEPTIDE - Abnormal; Notable for the following components:   B Natriuretic Peptide 1,610.9 (*)  All other components within normal limits  CBC WITH DIFFERENTIAL/PLATELET - Abnormal; Notable for the following components:   WBC 11.3 (*)    RBC 3.78 (*)    Hemoglobin 10.4 (*)    HCT 33.8 (*)    RDW 16.3 (*)    Neutro Abs 9.4 (*)    All other components within normal limits  TROPONIN I (HIGH SENSITIVITY) - Abnormal; Notable for the following components:   Troponin I (High Sensitivity) 18 (*)    All other components within normal limits  TROPONIN I (HIGH SENSITIVITY) - Abnormal; Notable for the following components:   Troponin I (High Sensitivity) 22 (*)    All other components within normal limits     EKG Interpreted by me Sinus tachycardia rate 103.  Right axis, left bundle branch block.  No acute ischemic changes.  2 PVCs on the strip.   RADIOLOGY Chest x-ray interpreted by me, small bilateral pleural effusions.  Radiology report reviewed noting possible right upper lobe pneumonia   PROCEDURES:  Procedures   MEDICATIONS ORDERED IN ED: Medications  azithromycin (ZITHROMAX) 500 mg in sodium chloride  0.9 % 250 mL IVPB (has no administration in time range)  furosemide (LASIX) 160 mg in dextrose 5 % 50 mL IVPB (160 mg Intravenous New Bag/Given 09/11/23 2225)  cefTRIAXone (ROCEPHIN) 2 g in sodium chloride 0.9 % 100 mL IVPB (0 g Intravenous Stopped 09/11/23 2222)     IMPRESSION / MDM / ASSESSMENT AND PLAN / ED COURSE  I reviewed the triage vital signs and the nursing notes.  DDx: Pulmonary edema, pleural effusion, pneumothorax, pneumonia, electrolyte abnormality, anemia, non-STEMI, decompensated CHF  Patient's presentation is most consistent with acute presentation with potential threat to life or bodily function.  Patient presents with worsening symptomatic CHF, failing outpatient management.  Will give bolus dose of IV Lasix, start antibiotics for possible pneumonia seen on chest x-ray.  Will need to admit for further care.   ----------------------------------------- 10:41 PM on 09/11/2023 ----------------------------------------- Case discussed with hospitalist      FINAL CLINICAL IMPRESSION(S) / ED DIAGNOSES   Final diagnoses:  Acute on chronic congestive heart failure, unspecified heart failure type (HCC)     Rx / DC Orders   ED Discharge Orders     None        Note:  This document was prepared using Dragon voice recognition software and may include unintentional dictation errors.   Sharman Cheek, MD 09/11/23 2242

## 2023-09-11 NOTE — Assessment & Plan Note (Signed)
Patient with low-grade temp of 99 leukocytosis of 11,000 chest x-ray showing right upper lobe opacity suspicious for pneumonia Received Rocephin and azithromycin in the ED Will get procalcitonin to see whether additional antibiotics needed Will get respiratory viral panel

## 2023-09-11 NOTE — Assessment & Plan Note (Signed)
Continue metoprolol Patient intolerant of Eliquis (per review of heart failure clinic note from earlier in the day) Patient has been previously referred for ablation Continue aspirin

## 2023-09-11 NOTE — ED Triage Notes (Signed)
Pt presents to ED from heart failure clinic due to SOB, pt is speaking in full sentences with no distress. Pt had labs drawn at heart failure clinic and BNP was elevated no other labs drawn at clinic. Pt is A&Ox4.

## 2023-09-11 NOTE — Assessment & Plan Note (Signed)
No acute issues suspected 

## 2023-09-11 NOTE — ED Notes (Signed)
This RN and another RN attempted IV access on patient. IV access unsuccessful at this time. This RN placed orders for IV team at this time.

## 2023-09-11 NOTE — Assessment & Plan Note (Signed)
Blood sugar over 200 Sliding scale insulin coverage.  Hold home metformin and glipizide

## 2023-09-11 NOTE — Progress Notes (Signed)
Anticoagulation monitoring(Lovenox):  77 yo male ordered Lovenox 40 mg Q24h    Filed Weights   09/11/23 1625  Weight: 117.9 kg (260 lb)   BMI 30.05    Lab Results  Component Value Date   CREATININE 1.55 (H) 09/11/2023   CREATININE 1.65 (H) 09/11/2023   CREATININE 1.54 (H) 09/06/2023   Estimated Creatinine Clearance: 55.8 mL/min (A) (by C-G formula based on SCr of 1.55 mg/dL (H)). Hemoglobin & Hematocrit     Component Value Date/Time   HGB 10.4 (L) 09/11/2023 1627   HGB 11.1 (L) 07/09/2014 0452   HCT 33.8 (L) 09/11/2023 1627   HCT 34.3 (L) 07/09/2014 4098     Per Protocol for Patient with estCrcl > 30 ml/min and BMI > 30, will transition to Lovenox 60 mg Q24h.

## 2023-09-11 NOTE — Patient Instructions (Addendum)
Medication Changes:  STOP taking furosemide (lasix)  START taking Bumex 2mg  once daily. Prescription sent to pharmacy.   Lab Work:  Labs done today, your results will be available in MyChart, we will contact you for abnormal readings.    Special Instructions // Education:  Call [facility] cardiology at [phone] to schedule an appointment  Do the following things EVERYDAY: Weigh yourself in the morning before breakfast. Write it down and keep it in a log. Take your medicines as prescribed Eat low salt foods--Limit salt (sodium) to 2000 mg per day.  Stay as active as you can everyday Limit all fluids for the day to less than 2 liters   Follow-Up in: 2 weeks with Clarisa Kindred    If you have any questions or concerns before your next appointment please send [place] a message through [place] or call our office at [phone] Monday-Friday 8 am-5 pm.   If you have an urgent need after hours on the weekend please call your Primary Cardiologist or the Advanced Heart Failure Clinic in [place] at [phone].   At the Advanced Heart Failure Clinic, you and your health needs are our priority. We have a designated team specialized in the treatment of Heart Failure. This Care Team includes your primary Heart Failure Specialized Cardiologist (physician), Advanced Practice Providers (APPs- Physician Assistants and Nurse Practitioners), and Pharmacist who all work together to provide you with the care you need, when you need it.   You may see any of the following providers on your designated Care Team at your next follow up:  Dr. Arvilla Meres Dr. Marca Ancona Dr. Dorthula Nettles Dr. Theresia Bough Tonye Becket, NP Robbie Lis, [place] [address] [place], [place] Brynda Peon, NP [place] Lee, NP Clarisa Kindred, NP Enos Fling, PharmD

## 2023-09-11 NOTE — Assessment & Plan Note (Addendum)
Nonischemic cardiomyopathy Bilateral pleural effusions Moderate mitral regurgitation Patient failing outpatient management Continue Lasix infusion started in the ED Continue GDMT with metoprolol and Kerendia Patient previously on Entresto but did not tolerate it (per review of heart failure clinic note earlier in the day) Daily weights with intake and output monitoring Last echo from October with EF 30 to 35% so will not repeat Cardiology consult for additional recommendations

## 2023-09-11 NOTE — Assessment & Plan Note (Signed)
Chronic plantar ulcers Follow-up foot x-rays Wound care

## 2023-09-11 NOTE — ED Provider Triage Note (Signed)
Emergency Medicine Provider Triage Evaluation Note  Tyler [name] , a 77 y.o. male  was evaluated in triage.  Pt complains of SOB and 10 lb weight gain x1-2 weeks.  Review of Systems  Positive: Sob, doe Negative: cp  Physical Exam  There were no vitals taken for this visit. Gen:   Awake, no distress   Resp:  Normal effort  MSK:   Moves extremities without difficulty  Other:    Medical Decision Making  Medically screening exam initiated at 4:21 PM.  Appropriate orders placed.  Tyler [name] was informed that the remainder of the evaluation will be completed by another provider, this initial triage assessment does not replace that evaluation, and the importance of remaining in the ED until their evaluation is complete.     Jackelyn Hoehn, PA-C 09/11/23 1624

## 2023-09-11 NOTE — Assessment & Plan Note (Signed)
BP controlled.  Continue metoprolol 

## 2023-09-11 NOTE — Assessment & Plan Note (Deleted)
No acute issues suspected 

## 2023-09-11 NOTE — Assessment & Plan Note (Signed)
 Renal function at baseline

## 2023-09-11 NOTE — Progress Notes (Signed)
ReDS Vest / Clip - 09/11/23 0932       ReDS Vest / Clip   Station Marker D    Ruler Value 33    ReDS Value Range High volume overload    ReDS Actual Value 60            Two low quality readings prior to this reading.

## 2023-09-11 NOTE — Assessment & Plan Note (Signed)
See above. 

## 2023-09-11 NOTE — H&P (Incomplete)
History and Physical    Patient: Tyler [name] ZOX:[id number] DOB: Jun 22, 1946 DOA: 09/11/2023 DOS: the patient was seen and examined on 09/11/2023 PCP: Elder Negus, NP  Patient coming from: Home  Chief Complaint:  Chief Complaint  Patient presents with   Shortness of Breath    HPI: Tyler [name] is a 77 y.o. 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 patient has had a weight gain of 10 pounds in the past week as well as dyspnea on exertion that has been worsening.  He could only walk a few feet before having to stop to rest.  Also has orthopnea.  He denies chest pain.  He also has a cough that is productive of yellow sputum ongoing for the past few months.  He denies fever or chills.  He was at heart failure clinic earlier in the day on 12/18 and his Lasix was switched out for Bumex patient has had intermittent IV Lasix treatments at the heart failure clinic and in the ED but without significant improvement.    Denies chest pain.  Additionally he states that the wounds at the bottom of his feet seem to be oozing.  He is unclear about wound care to the area.  He was last seen by podiatry 08/06/2023 and ulcers had had clean bases at the time-photos on podiatry note ED course and data review: Low-grade temp of 99 with pulse 105 mild tachypnea to 22 with O2 sat 98% on room air, BP 129/72. Notable findings on workup as follows: Troponin 22, BNP 3874 WBC 11,000, hemoglobin 10.5 Glucose 209, creatinine 5-baseline EKG, personally viewed and interpreted showing sinus tachycardia at 103 with VPCs and T  wave inversion in lateral leads Chest x-ray showed mild vague right upper lobe opacity suspicious for pneumonia as well as small bilateral pleural effusions without frank interstitial edema Foot x-rays done-results pending--> in keeping with osteomyelitis on the right as follows IMPRESSION: 1. Large ulcer within the medial and plantar right great toe which extends to the cortex of the base of the distal phalanx with osseous erosion in this location in keeping with changes of osteomyelitis. 2. Transmetatarsal amputation of the right third toe and resection of the right fourth metatarsal head.  Patient started on a Lasix infusion Started on ceftriaxone and azithromycin for possible pneumonia  Hospitalist consulted for admission.   Review of Systems: As mentioned in the history of present illness. All other systems reviewed and are negative.  Past Medical History:  Diagnosis Date   (HFpEF) heart failure with preserved ejection fraction (HCC) 03/01/2020   a.) TTE 03/01/2020: EF 55-60%, mod MAC, mod AoV sclerosis, triv AR, mild TR, mod MR, RVSP 50-59; b.) TTE 09/10/2020: EF 55-60%, mod MAC, mod AoV sclerosis, mild TR, 3+ MR, RVSP 50-59; c.) TTE 09/26/2020: EF 55-60%, mild LA dil, triv PR, mild MR/TR, RVSP 37-49   Adenoma of left adrenal gland    Anemia    Arthritis    Atrial fibrillation and flutter (HCC)    a.) CHA2DS2VASc = 5 (age x2, HFpEF, HTN, T2DM);  b.) s/p CTI ablation 09/07/2020; c.) rate/rhythm maintained on oral carvedilol; not on chronic anticoagulation therapy   CAD (coronary artery  disease)    Cardiomegaly    CKD (chronic kidney disease), stage III (HCC)    DOE (dyspnea on exertion)    Drug-induced bradycardia    Gangrene of toe of left foot (HCC)    a.) 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    HLD (hyperlipidemia)    Hypertension    Long term current use of aspirin    Lymphedema of both lower  extremities    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)    a.) s/p amputation 11/04/2022   Peripheral vascular disease (HCC)    Pleural effusion on right 09/09/2020   a.) s/p RIGHT thoracentesis with 2180 cc yield   Pneumonia    Presence of permanent cardiac pacemaker 09/10/2020   a.) TVP placement 09/10/2020 due to intermittent CHB in setting of urosepsis; b.) s/p PPM placement 09/15/2020: MDT Azure XT DR (SN: [id number])   Pulmonary hypertension (HCC) 03/01/2020   a.) TTE: 03/01/2020: RVSP 50-59; b.) TTE 09/10/2020: RVSP 50-59; c.) TTE 09/26/2020: RVSP 37-49   RA (rheumatoid arthritis) (HCC)    Rheumatic fever    Sepsis (HCC) 09/10/2020   a.) urosepsis --> BC x 2 sets and UC all grew out significant Proteus mirabilis; admitted to [facility] 09/07/2020 - 09/27/2020.   Sick sinus syndrome [facility])    a.) s/p MDT PPM placement 09/15/2020   T2DM (type 2 diabetes mellitus) (HCC)    Urinary retention    chronic, with indwelling Foley catheter and plans for a suprapubic   Wears dentures    full upper   Past Surgical History:  Procedure Laterality Date   AMPUTATION TOE Right 11/04/2022   Procedure: AMPUTATION TOE;  Surgeon: Felecia Shelling, [name];  [name];  Service: Podiatry;  Laterality: Right;   BONE BIOPSY Right 04/05/2023   Procedure: BONE BIOPSY THIRD & FOURTH;  Surgeon: Felecia Shelling, [name];  [name];  Service: Podiatry;  Laterality: Right;   CARDIAC ELECTROPHYSIOLOGY STUDY AND ABLATION N/A 09/07/2020   Procedure: CARDIAC EP STUDY AND ABLATION (CTI)   CATARACT EXTRACTION W/PHACO Right 01/16/2017   Procedure: CATARACT EXTRACTION PHACO AND INTRAOCULAR LENS PLACEMENT (IOC)  Right Complicated;  Surgeon: Lockie Mola, MD;  Location: [facility];  Service: Ophthalmology;  Laterality: Right;  IVA Block Healon 5 malyugin vision blue Diabetic - oral meds   CATARACT EXTRACTION W/PHACO Left 10/23/2022   Procedure: CATARACT EXTRACTION PHACO AND  INTRAOCULAR LENS PLACEMENT (IOC) LEFT DIABETIC;  Surgeon: Galen Manila, MD;  Location: [facility];  Service: Ophthalmology;  Laterality: Left;  Diabetic   COLONOSCOPY     LOWER EXTREMITY ANGIOGRAPHY Right 11/02/2022   Procedure: Lower Extremity Angiography;  Surgeon: Annice Needy, MD;  Location: [facility] IN[facility];  Service: Cardiovascular;  Laterality: Right;   METATARSAL HEAD EXCISION Left 07/05/2018   Procedure: RESECTION FIRST METATARSAL INFECTED BONE AND SOFT TISSUE;  Surgeon: Recardo Evangelist, [name];  [name];  Service: Podiatry;  Laterality: Left;   METATARSAL HEAD EXCISION Right 04/05/2023   Procedure: METATARSAL HEAD EXCISION THIRD & FOURTH;  Surgeon: Felecia Shelling, [name];  [name];  Service: Podiatry;  Laterality: Right;   PACEMAKER INSERTION  09/15/2020   TOE AMPUTATION Left    TONSILLECTOMY     Social History:  reports that he has never smoked. He has never used smokeless tobacco. He reports that he does not currently use alcohol. He reports that he  does not use drugs.  Allergies  Allergen Reactions   Eliquis [Apixaban]     Dizziness  and vision change   Spironolactone Other (See Comments)    dizziness    Family History  Problem Relation Age of Onset   Cancer Niece     Prior to Admission medications   Medication Sig Start Date End Date Taking? Authorizing Provider  acetaminophen (TYLENOL) 500 MG tablet Take 2 tablets (1,000 mg total) by mouth 3 (three) times daily as needed (for pain). 08/18/23   Darlin Priestly, MD  aspirin EC 81 MG tablet Take 1 tablet (81 mg total) by mouth daily. Swallow whole. 11/07/22   Sunnie Nielsen, DO  bumetanide (BUMEX) 2 MG tablet Take 1 tablet (2 mg total) by mouth daily. 09/11/23   Delma Freeze, FNP  Finerenone (KERENDIA) 10 MG TABS Take 1 tablet (10 mg total) by mouth daily. 08/18/23   Darlin Priestly, MD  gentamicin cream (GARAMYCIN) 0.1 % Apply 1 Application topically 2 (two) times daily. 05/31/23   Felecia Shelling, [name]  glipiZIDE (GLUCOTROL) 10 MG tablet Take 1 tablet (10 mg total) by mouth 2 (two) times daily. 08/18/23   Darlin Priestly, MD  metFORMIN (GLUCOPHAGE) 1000 MG tablet Take 1 tablet (1,000 mg total) by mouth 2 (two) times daily. 08/18/23   Darlin Priestly, MD  metolazone (ZAROXOLYN) 2.5 MG tablet Take 1 tablet (2.5 mg total) by mouth once a week. On Mondays Patient taking differently: Take 2.5 mg by mouth 2 (two) times a week. On Mondays & Fridays 08/18/23   Darlin Priestly, MD  metoprolol succinate (TOPROL-XL) 50 MG 24 hr tablet Take 1 tablet (50 mg total) by mouth daily. Take with or immediately following a meal. 08/18/23   Darlin Priestly, MD  Multiple Vitamins-Minerals (MULTIVITAMIN WITH MINERALS) tablet Take 1 tablet by mouth daily.    [provider]  OVER THE COUNTER MEDICATION 1 Scoop daily. Super Beets Powder    [provider]  sacubitril-valsartan (ENTRESTO) 24-26 MG Take 1 tablet by mouth 2 (two) times daily. Patient not taking: Reported on 09/11/2023 08/18/23   Darlin Priestly, MD  silver sulfADIAZINE (SILVADENE) 1 % cream Apply to affected area daily 06/25/23 06/24/24  Felecia Shelling, [name]    Physical Exam: Vitals:   09/11/23 1624 09/11/23 1625 09/11/23 1937 09/11/23 2013  BP: 129/72  132/86   Pulse: 95  (!) 105   Resp: (!) 22  19   Temp: 99 F (37.2 C)  98.4 F (36.9 C)   TempSrc: Oral  Oral   SpO2: 98%  96% 96%  Weight:  117.9 kg    Height:  6\' 6"  (1.981 m)     Physical Exam Vitals and nursing note reviewed.  Constitutional:      General: He is not in acute distress.    Comments: Coughing and spitting up thick yellowish phlegm  HENT:     Head: Normocephalic and atraumatic.  Cardiovascular:     Rate and Rhythm: Regular rhythm. Tachycardia present.     Heart sounds: Normal heart sounds.  Pulmonary:     Effort: Pulmonary effort is normal.     Breath sounds: Rales present.  Abdominal:     Palpations: Abdomen is soft.     Tenderness: There is no abdominal tenderness.   Musculoskeletal:     Right lower leg: Edema present.     Left lower leg: Edema present.     Comments: Ulcers plantar aspect bilateral feet with purulent  oozing.  Prior left great toe amputation  See pic below  Skin:    Comments: Venous stasis dermatitis rash, see pic below  Neurological:     Mental Status: Mental status is at baseline.  Psychiatric:        Mood and Affect: Mood is depressed.          Labs on Admission: I have personally reviewed following labs and imaging studies  CBC: Recent Labs  Lab 09/11/23 1627  WBC 11.3*  NEUTROABS 9.4*  HGB 10.4*  HCT 33.8*  MCV 89.4  PLT 298   Basic Metabolic Panel: Recent Labs  Lab 09/06/23 1130 09/11/23 1105 09/11/23 1627  NA 136 136 136  K 4.1 5.0 5.0  CL 106 105 106  CO2 21* 21* 21*  GLUCOSE 271* 241* 209*  BUN 50* 56* 54*  CREATININE 1.54* 1.65* 1.55*  CALCIUM 8.5* 8.8* 8.9   GFR: Estimated Creatinine Clearance: 55.8 mL/min (A) (by C-G formula based on SCr of 1.55 mg/dL (H)). Liver Function Tests: No results for input(s): "AST", "ALT", "ALKPHOS", "BILITOT", "PROT", "ALBUMIN" in the last 168 hours. No results for input(s): "LIPASE", "AMYLASE" in the last 168 hours. No results for input(s): "AMMONIA" in the last 168 hours. Coagulation Profile: No results for input(s): "INR", "PROTIME" in the last 168 hours. Cardiac Enzymes: No results for input(s): "CKTOTAL", "CKMB", "CKMBINDEX", "TROPONINI" in the last 168 hours. BNP (last 3 results) No results for input(s): "PROBNP" in the last 8760 hours. HbA1C: No results for input(s): "HGBA1C" in the last 72 hours. CBG: No results for input(s): "GLUCAP" in the last 168 hours. Lipid Profile: No results for input(s): "CHOL", "HDL", "LDLCALC", "TRIG", "CHOLHDL", "LDLDIRECT" in the last 72 hours. Thyroid Function Tests: No results for input(s): "TSH", "T4TOTAL", "FREET4", "T3FREE", "THYROIDAB" in the last 72 hours. Anemia Panel: No results for input(s): "VITAMINB12",  "FOLATE", "FERRITIN", "TIBC", "IRON", "RETICCTPCT" in the last 72 hours. Urine analysis:    Component Value Date/Time   COLORURINE YELLOW (A) 07/04/2023 0640   APPEARANCEUR CLEAR (A) 07/04/2023 0640   APPEARANCEUR Clear 07/05/2014 2040   LABSPEC 1.015 07/04/2023 0640   LABSPEC 1.030 07/05/2014 2040   PHURINE 6.0 07/04/2023 0640   GLUCOSEU NEGATIVE 07/04/2023 0640   GLUCOSEU >=500 07/05/2014 2040   HGBUR NEGATIVE 07/04/2023 0640   BILIRUBINUR NEGATIVE 07/04/2023 0640   BILIRUBINUR Negative 07/05/2014 2040   KETONESUR NEGATIVE 07/04/2023 0640   PROTEINUR NEGATIVE 07/04/2023 0640   NITRITE NEGATIVE 07/04/2023 0640   LEUKOCYTESUR TRACE (A) 07/04/2023 0640   LEUKOCYTESUR Negative 07/05/2014 2040    Radiological Exams on Admission: DG Foot 2 Views Right Result Date: 09/11/2023 CLINICAL DATA:  Diabetic foot ulcer EXAM: RIGHT FOOT - 2 VIEW COMPARISON:  None Available. FINDINGS: Transmetatarsal amputation of the right third toe and resection of the right fourth metatarsal head. Large ulcer is seen within the medial and plantar right great toe which extends to the cortex of the base of the distal phalanx with osseous erosion in this location in keeping with changes of osteomyelitis. There is extensive superimposed soft tissue swelling right great toe and second toe as well as moderate soft tissue swelling of the right forefoot diffusely. Vascular calcifications are noted. Advanced degenerative arthritis noted of the first metatarsophalangeal joint. No acute fracture or dislocation. IMPRESSION: 1. Large ulcer within the medial and plantar right great toe which extends to the cortex of the base of the distal phalanx with osseous erosion in this location in keeping with changes of osteomyelitis. 2. Transmetatarsal amputation of the  right third toe and resection of the right fourth metatarsal head. Electronically Signed   By: Helyn Numbers M.D.   On: 09/11/2023 22:48   DG Foot 2 Views Left Result  Date: 09/11/2023 CLINICAL DATA:  Diabetic ulcer EXAM: LEFT FOOT - 2 VIEW COMPARISON:  None Available. FINDINGS: No acute fracture or dislocation. 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 There is a soft tissue wound within the soft tissues distal to the residual first metatarsal. No definite superimposed osseous erosion. There is diffuse soft tissue swelling of the second and third digits. Posttraumatic changes noted involving the second metatarsal head. Moderate midfoot and tibiotalar degenerative arthritis. Vascular calcifications are noted. IMPRESSION: 1. 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 Soft tissue wound within the soft tissues distal to the residual first metatarsal. No definite superimposed osseous erosion. 2. Diffuse soft tissue swelling of the second and third digits. Electronically Signed   By: Helyn Numbers M.D.   On: 09/11/2023 22:46   DG Chest 2 View Result Date: 09/11/2023 CLINICAL DATA:  Shortness of breath EXAM: CHEST - 2 VIEW COMPARISON:  08/13/2023 FINDINGS: Small bilateral pleural effusions. No frank interstitial edema. Mild vague right upper lobe opacity, suspicious for pneumonia. No pleural effusion or pneumothorax. Cardiomegaly.  Left subclavian pacemaker. Degenerative changes of the visualized thoracolumbar spine. IMPRESSION: Mild vague right upper lobe opacity, suspicious for pneumonia. Small bilateral pleural effusions. No frank interstitial edema. Electronically Signed   By: Charline Bills M.D.   On: 09/11/2023 18:09     Data Reviewed: Relevant notes from primary care and specialist visits, past discharge summaries as available in EHR, including Care Everywhere. Prior diagnostic testing as pertinent to current admission diagnoses Updated medications and problem lists for reconciliation ED course, including vitals, labs, imaging, treatment and response to treatment Triage notes, nursing and pharmacy notes and ED provider's  notes Notable results as noted in HPI   Assessment and Plan: Acute on chronic heart failure with reduced ejection fraction (HFrEF,30-35 %) (HCC) Nonischemic cardiomyopathy Bilateral pleural effusions Moderate mitral regurgitation Patient failing outpatient management Continue Lasix infusion started in the ED Continue GDMT with metoprolol and Kerendia Patient previously on Entresto but did not tolerate it (per review of heart failure clinic note earlier in the day) Daily weights with intake and output monitoring Last echo from October with EF 30 to 35% so will not repeat Cardiology consult for additional recommendations  CAP (community acquired pneumonia) Patient with several week history of productive cough Patient with low-grade temp of 99 leukocytosis of 11,000 chest x-ray showing right upper lobe opacity suspicious for pneumonia Received Rocephin and azithromycin in the ED Receiving Rocephin and vancomycin for osteomyelitis Pro-Calc 0.12 Follow-up respiratory viral panel  Infected plantar ulcers both feet (HCC) Osteomyelitis right foot History of amputation left great toe Holding off on MRI pending podiatry consult as patient has pacemaker Start Rocephin and vancomycin Podiatry and vascular consults Wound care consult  Persistent atrial fibrillation (HCC) Continue metoprolol Patient intolerant of Eliquis (per review of heart failure clinic note from earlier in the day) Patient has been previously referred for ablation Continue aspirin  Complete heart block (HCC)  s/p PACEMAKER No acute issues suspected  Uncontrolled type 2 diabetes mellitus with hyperglycemia, without long-term current use of insulin (HCC) Blood sugar over 200 Sliding scale insulin coverage.  Hold home metformin and glipizide  HTN (hypertension) BP controlled Continue metoprolol  Chronic kidney disease, stage 3a (HCC) Renal function at baseline  Anemia due to chronic kidney  disease Hemoglobin  at baseline        DVT prophylaxis: Lovenox  Consults: [facility] cardiology, Dr. Corky Sing, podiatry, Dr. Annamary Rummage, vascular, Dr. Gilda Crease  Advance Care Planning:   Code Status: Prior   Family Communication: none  Disposition Plan: Back to previous home environment  Severity of Illness: The appropriate patient status for this patient is INPATIENT. Inpatient status is judged to be reasonable and necessary in order to provide the required intensity of service to ensure the patient's safety. [name] presenting symptoms, physical exam findings, and initial radiographic and laboratory data in the context of their chronic comorbidities is felt to place them at high risk for further clinical deterioration. Furthermore, it is not anticipated that the patient will be medically stable for discharge from the hospital within 2 midnights of admission.   * I certify that at the point of admission it is my clinical judgment that the patient will require inpatient hospital care spanning beyond 2 midnights from the point of admission due to high intensity of service, high risk for further deterioration and high frequency of surveillance required.* CRITICAL CARE Performed by: Andris Baumann   Total critical care time: 72 minutes  Critical care time was exclusive of separately billable procedures and treating other patients.  Critical care was necessary to treat or prevent imminent or life-threatening deterioration.  Critical care was time spent personally by me on the following activities: development of treatment plan with patient and/or surrogate as well as nursing, discussions with consultants, evaluation of patient's response to treatment, examination of patient, obtaining history from patient or surrogate, ordering and performing treatments and interventions, ordering and review of laboratory studies, ordering and review of radiographic studies, pulse oximetry and re-evaluation of patient's  condition.  Author: Andris Baumann, MD 09/11/2023 10:55 PM  For on call review [email or web address].

## 2023-09-12 ENCOUNTER — Inpatient Hospital Stay: Payer: BC Managed Care – PPO

## 2023-09-12 ENCOUNTER — Inpatient Hospital Stay (HOSPITAL_COMMUNITY): Admit: 2023-09-12 | Payer: BC Managed Care – PPO | Admitting: Internal Medicine

## 2023-09-12 ENCOUNTER — Encounter (HOSPITAL_COMMUNITY): Payer: Self-pay

## 2023-09-12 DIAGNOSIS — M869 Osteomyelitis, unspecified: Secondary | ICD-10-CM

## 2023-09-12 DIAGNOSIS — L089 Local infection of the skin and subcutaneous tissue, unspecified: Secondary | ICD-10-CM | POA: Diagnosis not present

## 2023-09-12 DIAGNOSIS — I5023 Acute on chronic systolic (congestive) heart failure: Secondary | ICD-10-CM

## 2023-09-12 DIAGNOSIS — M86471 Chronic osteomyelitis with draining sinus, right ankle and foot: Secondary | ICD-10-CM | POA: Diagnosis not present

## 2023-09-12 DIAGNOSIS — L98494 Non-pressure chronic ulcer of skin of other sites with necrosis of bone: Secondary | ICD-10-CM | POA: Diagnosis not present

## 2023-09-12 DIAGNOSIS — J189 Pneumonia, unspecified organism: Secondary | ICD-10-CM

## 2023-09-12 LAB — BASIC METABOLIC PANEL
Anion gap: 11 (ref 5–15)
BUN: 51 mg/dL — ABNORMAL HIGH (ref 8–23)
CO2: 21 mmol/L — ABNORMAL LOW (ref 22–32)
Calcium: 8.6 mg/dL — ABNORMAL LOW (ref 8.9–10.3)
Chloride: 103 mmol/L (ref 98–111)
Creatinine, Ser: 1.45 mg/dL — ABNORMAL HIGH (ref 0.61–1.24)
GFR, Estimated: 49 mL/min — ABNORMAL LOW (ref >60)
Glucose, Bld: 237 mg/dL — ABNORMAL HIGH (ref 70–99)
Potassium: 4.2 mmol/L (ref 3.5–5.1)
Sodium: 135 mmol/L (ref 135–145)

## 2023-09-12 LAB — RESP PANEL BY RT-PCR (RSV, FLU A&B, COVID)  RVPGX2
Influenza A by PCR: NEGATIVE
Influenza B by PCR: NEGATIVE
Resp Syncytial Virus by PCR: NEGATIVE
SARS Coronavirus 2 by RT PCR: NEGATIVE

## 2023-09-12 LAB — CBC
HCT: 30.9 % — ABNORMAL LOW (ref 39.0–52.0)
Hemoglobin: 9.4 g/dL — ABNORMAL LOW (ref 13.0–17.0)
MCH: 27.2 pg (ref 26.0–34.0)
MCHC: 30.4 g/dL (ref 30.0–36.0)
MCV: 89.3 fL (ref 80.0–100.0)
Platelets: 234 10*3/uL (ref 150–400)
RBC: 3.46 MIL/uL — ABNORMAL LOW (ref 4.22–5.81)
RDW: 16.5 % — ABNORMAL HIGH (ref 11.5–15.5)
WBC: 9.9 10*3/uL (ref 4.0–10.5)
nRBC: 0 % (ref 0.0–0.2)

## 2023-09-12 LAB — CBG MONITORING, ED
Glucose-Capillary: 179 mg/dL — ABNORMAL HIGH (ref 70–99)
Glucose-Capillary: 144 mg/dL — ABNORMAL HIGH (ref 70–99)

## 2023-09-12 LAB — GLUCOSE, CAPILLARY: Glucose-Capillary: 184 mg/dL — ABNORMAL HIGH (ref 70–99)

## 2023-09-12 MED ORDER — VANCOMYCIN HCL 2000 MG/400ML IV SOLN
2000.0000 mg | INTRAVENOUS | Status: DC
  Administered 2023-09-13 – 2023-09-16 (×4): 2000 mg via INTRAVENOUS
  Filled 2023-09-12 (×4): qty 400

## 2023-09-12 MED ORDER — VANCOMYCIN HCL 500 MG/100ML IV SOLN
500.0000 mg | Freq: Once | INTRAVENOUS | Status: AC
  Administered 2023-09-12: 500 mg via INTRAVENOUS
  Filled 2023-09-12: qty 100

## 2023-09-12 MED ORDER — CEFTRIAXONE SODIUM 1 G IJ SOLR
1.0000 g | INTRAMUSCULAR | Status: DC
  Administered 2023-09-12 – 2023-09-15 (×4): 1 g via INTRAVENOUS
  Filled 2023-09-12 (×4): qty 10

## 2023-09-12 MED ORDER — METOLAZONE 2.5 MG PO TABS: 2.5000 mg | ORAL_TABLET | ORAL | Status: DC

## 2023-09-12 MED ORDER — VANCOMYCIN HCL 2000 MG/400ML IV SOLN
2000.0000 mg | Freq: Once | INTRAVENOUS | Status: AC
  Administered 2023-09-12: 2000 mg via INTRAVENOUS
  Filled 2023-09-12: qty 400

## 2023-09-12 MED ORDER — BUMETANIDE 0.25 MG/ML IJ SOLN
2.0000 mg | Freq: Two times a day (BID) | INTRAMUSCULAR | Status: DC
  Administered 2023-09-12 – 2023-09-13 (×3): 2 mg via INTRAVENOUS
  Filled 2023-09-12 (×3): qty 8

## 2023-09-12 NOTE — Assessment & Plan Note (Addendum)
Osteomyelitis right foot History of amputation left great toe Holding off on MRI pending podiatry consult as patient has pacemaker Start Rocephin and vancomycin Podiatry and vascular consults Wound care consult

## 2023-09-12 NOTE — Progress Notes (Addendum)
I spoke with Morrie Sheldon at the MRI department.  She confirmed that patient can have the MRI done at [facility].  Transfer to [facility] has been canceled.  [facility] has been notified.  Dr. Annamary Rummage, podiatrist, has been updated as well.

## 2023-09-12 NOTE — Consult Note (Signed)
[facility] CARDIOLOGY CONSULT NOTE       Patient ID: Tyler [name] MRN: [id number] DOB/AGE: Dec 02, 1945 77 y.o.  Admit date: 09/11/2023 Referring Physician Dr. Lindajo Royal Primary Physician Elder Negus, NP  Primary Cardiologist Dr. Corky Sing (has been scheduled but no showed appointment) Reason for Consultation AoCHF  HPI: Tyler [name] is a 77 y.o. male  with a past medical history of  chronic HFrEF, persistent atrial fibrillation, mitral insufficiency, CHB s/p PPM 2021, hypertension, chronic kidney disease stage III  who presented to the ED on 09/11/2023 for shortness of breath. Cardiology was consulted for further evaluation.   Patient reports that he chronic shortness of breath for many months, this has been an ongoing issue and he feels it is slightly worse prior to him coming to the ED.  He was seen outpatient in heart failure clinic yesterday Tyler [name] BNP was found to be elevated at 3500 mL he was advised to come to the ED for diuresis.  Workup in the ED notable for creatinine 1.55, potassium 5.0, hemoglobin 10.4, WBC 11.3.  BNP 3874.  Troponins 18 > 22.  Chest x-ray with small bilateral effusions but no overt interstitial edema.  He was started on Lasix drip in the ED.  He is also being treated for bilateral foot ulcers with concern for osteomyelitis.  At the time of my evaluation this morning patient is resting comfortably in stretcher.  He states that overall he feels his breathing has improved and is less short of breath after initial IV diuresis. Has chronic LE edema, he is unsure if this is improved. Has noticed more drainage from foot ulcers. Denies any chest pain, palpitation symptoms. BP and HR remain stable.   Review of systems complete and found to be negative unless listed above    Past Medical History:  Diagnosis Date   (HFpEF) heart failure with preserved ejection fraction (HCC) 03/01/2020   a.) TTE 03/01/2020: EF 55-60%, mod MAC, mod AoV sclerosis, triv  AR, mild TR, mod MR, RVSP 50-59; b.) TTE 09/10/2020: EF 55-60%, mod MAC, mod AoV sclerosis, mild TR, 3+ MR, RVSP 50-59; c.) TTE 09/26/2020: EF 55-60%, mild LA dil, triv PR, mild MR/TR, RVSP 37-49   Adenoma of left adrenal gland    Anemia    Arthritis    Atrial fibrillation and flutter (HCC)    a.) CHA2DS2VASc = 5 (age x2, HFpEF, HTN, T2DM);  b.) s/p CTI ablation 09/07/2020; c.) rate/rhythm maintained on oral carvedilol; not on chronic anticoagulation therapy   CAD (coronary artery disease)    Cardiomegaly    CKD (chronic kidney disease), stage III (HCC)    DOE (dyspnea on exertion)    Drug-induced bradycardia    Gangrene of toe of left foot (HCC)    a.) 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    HLD (hyperlipidemia)    Hypertension    Long term current use of aspirin    Lymphedema of both lower extremities    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)    a.) s/p amputation 11/04/2022   Peripheral vascular disease (HCC)    Pleural effusion on right 09/09/2020   a.) s/p RIGHT thoracentesis with 2180 cc yield   Pneumonia    Presence of permanent cardiac pacemaker 09/10/2020   a.) TVP placement 09/10/2020 due to intermittent CHB in setting of urosepsis; b.) s/p PPM placement 09/15/2020: MDT Azure XT DR (SN: [id number])  Pulmonary hypertension (HCC) 03/01/2020   a.) TTE: 03/01/2020: RVSP 50-59; b.) TTE 09/10/2020: RVSP 50-59; c.) TTE 09/26/2020: RVSP 37-49   RA (rheumatoid arthritis) (HCC)    Rheumatic fever    Sepsis (HCC) 09/10/2020   a.) urosepsis --> BC x 2 sets and UC all grew out significant Proteus mirabilis; admitted to [facility] 09/07/2020 - 09/27/2020.   Sick sinus syndrome [facility])    a.) s/p MDT PPM placement 09/15/2020   T2DM (type 2 diabetes mellitus) (HCC)    Urinary retention    chronic, with indwelling Foley catheter and plans for a suprapubic   Wears dentures    full upper    Past  Surgical History:  Procedure Laterality Date   AMPUTATION TOE Right 11/04/2022   Procedure: AMPUTATION TOE;  Surgeon: Felecia Shelling, [name];  [name];  Service: Podiatry;  Laterality: Right;   BONE BIOPSY Right 04/05/2023   Procedure: BONE BIOPSY THIRD & FOURTH;  Surgeon: Felecia Shelling, [name];  [name];  Service: Podiatry;  Laterality: Right;   CARDIAC ELECTROPHYSIOLOGY STUDY AND ABLATION N/A 09/07/2020   Procedure: CARDIAC EP STUDY AND ABLATION (CTI)   CATARACT EXTRACTION W/PHACO Right 01/16/2017   Procedure: CATARACT EXTRACTION PHACO AND INTRAOCULAR LENS PLACEMENT (IOC)  Right Complicated;  Surgeon: Lockie Mola, MD;  Location: [facility];  Service: Ophthalmology;  Laterality: Right;  IVA Block Healon 5 malyugin vision blue Diabetic - oral meds   CATARACT EXTRACTION W/PHACO Left 10/23/2022   Procedure: CATARACT EXTRACTION PHACO AND INTRAOCULAR LENS PLACEMENT (IOC) LEFT DIABETIC;  Surgeon: Galen Manila, MD;  Location: [facility];  Service: Ophthalmology;  Laterality: Left;  Diabetic   COLONOSCOPY     LOWER EXTREMITY ANGIOGRAPHY Right 11/02/2022   Procedure: Lower Extremity Angiography;  Surgeon: Annice Needy, MD;  Location: [facility] INVASIVE CV LAB;  Service: Cardiovascular;  Laterality: Right;   METATARSAL HEAD EXCISION Left 07/05/2018   Procedure: RESECTION FIRST METATARSAL INFECTED BONE AND SOFT TISSUE;  Surgeon: Recardo Evangelist, [name];  [name];  Service: Podiatry;  Laterality: Left;   METATARSAL HEAD EXCISION Right 04/05/2023   Procedure: METATARSAL HEAD EXCISION THIRD & FOURTH;  Surgeon: Felecia Shelling, [name];  [name];  Service: Podiatry;  Laterality: Right;   PACEMAKER INSERTION  09/15/2020   TOE AMPUTATION Left    TONSILLECTOMY      (Not in a hospital admission)  Social History   Socioeconomic History   Marital status: Single    Spouse name: Not on file   Number of children: Not on file   Years of education: Not  on file   Highest education level: Not on file  Occupational History    Comment: [employer]  Tobacco Use   Smoking status: Never   Smokeless tobacco: Never  Vaping Use   Vaping status: Never Used  Substance and Sexual Activity   Alcohol use: Not Currently    Comment: rarely   Drug use: Never   Sexual activity: Not Currently    Birth control/protection: None  Other Topics Concern   Not on file  Social History Narrative   Lives with roommate   Social Drivers of Health   Financial Resource Strain: Low Risk  (03/20/2023)   Received from [facility], [facility]   Overall Financial Resource Strain (CARDIA)    Difficulty of Paying Living Expenses: Not hard at all  Food Insecurity: No Food Insecurity (08/14/2023)   Hunger Vital Sign    Worried About Running  Out of Food in the Last Year: Never true    Ran Out of Food in the Last Year: Never true  Transportation Needs: No Transportation Needs (08/14/2023)   PRAPARE - Administrator, Civil Service (Medical): No    Lack of Transportation (Non-Medical): No  Physical Activity: Not on file  Stress: No Stress Concern Present (09/07/2020)   Received from [facility], [facility] - Occupational Stress Questionnaire    Feeling of Stress : Not at all  Social Connections: Unknown (02/01/2022)   Received from [facility], [facility]   Social Network    Social Network: Not on file  Intimate Partner Violence: Not At Risk (08/14/2023)   Humiliation, Afraid, Rape, and Kick questionnaire    Fear of Current or Ex-Partner: No    Emotionally Abused: No    Physically Abused: No    Sexually Abused: No    Family History  Problem Relation Age of Onset   Cancer Niece      Vitals:   09/12/23 0730 09/12/23 0830 09/12/23 1030 09/12/23 1051  BP: 131/89 125/71 139/81   Pulse: 96 95 94   Resp: (!) 22 (!) 26 19   Temp:    98.1 F (36.7 C)  TempSrc:    Oral  SpO2: 99% 94%  99%   Weight:      Height:        PHYSICAL EXAM General: Chronically ill appearing male, well nourished, in no acute distress. HEENT: Normocephalic and atraumatic. Neck: No JVD.  Lungs: Normal respiratory effort on room air. Clear bilaterally to auscultation. No wheezes, crackles, rhonchi.  Heart: HRRR. Normal S1 and S2 without gallops or murmurs.  Abdomen: Non-distended appearing.  Msk: Normal strength and tone for age. Extremities: Warm and well perfused. No clubbing, cyanosis. Chronic appearing bilateral LE edema.  Neuro: Alert and oriented X 3. Psych: Answers questions appropriately.   Labs: Basic Metabolic Panel: Recent Labs    09/11/23 1627 09/12/23 0340  NA 136 135  K 5.0 4.2  CL 106 103  CO2 21* 21*  GLUCOSE 209* 237*  BUN 54* 51*  CREATININE 1.55* 1.45*  CALCIUM 8.9 8.6*   Liver Function Tests: No results for input(s): "AST", "ALT", "ALKPHOS", "BILITOT", "PROT", "ALBUMIN" in the last 72 hours. No results for input(s): "LIPASE", "AMYLASE" in the last 72 hours. CBC: Recent Labs    09/11/23 1627 09/12/23 0340  WBC 11.3* 9.9  NEUTROABS 9.4*  --   HGB 10.4* 9.4*  HCT 33.8* 30.9*  MCV 89.4 89.3  PLT 298 234   Cardiac Enzymes: Recent Labs    09/11/23 1627 09/11/23 1943  TROPONINIHS 18* 22*   BNP: Recent Labs    09/11/23 1105 09/11/23 1627  BNP 3,491.5* 1,610.9*   D-Dimer: No results for input(s): "DDIMER" in the last 72 hours. Hemoglobin A1C: No results for input(s): "HGBA1C" in the last 72 hours. Fasting Lipid Panel: No results for input(s): "CHOL", "HDL", "LDLCALC", "TRIG", "CHOLHDL", "LDLDIRECT" in the last 72 hours. Thyroid Function Tests: No results for input(s): "TSH", "T4TOTAL", "T3FREE", "THYROIDAB" in the last 72 hours.  Invalid input(s): "FREET3" Anemia Panel: No results for input(s): "VITAMINB12", "FOLATE", "FERRITIN", "TIBC", "IRON", "RETICCTPCT" in the last 72 hours.   Radiology: DG Foot 2 Views Right Result Date:  09/11/2023 CLINICAL DATA:  Diabetic foot ulcer EXAM: RIGHT FOOT - 2 VIEW COMPARISON:  None Available. FINDINGS: Transmetatarsal amputation of the right third toe and resection of the right fourth  metatarsal head. Large ulcer is seen within the medial and plantar right great toe which extends to the cortex of the base of the distal phalanx with osseous erosion in this location in keeping with changes of osteomyelitis. There is extensive superimposed soft tissue swelling right great toe and second toe as well as moderate soft tissue swelling of the right forefoot diffusely. Vascular calcifications are noted. Advanced degenerative arthritis noted of the first metatarsophalangeal joint. No acute fracture or dislocation. IMPRESSION: 1. Large ulcer within the medial and plantar right great toe which extends to the cortex of the base of the distal phalanx with osseous erosion in this location in keeping with changes of osteomyelitis. 2. Transmetatarsal amputation of the right third toe and resection of the right fourth metatarsal head. Electronically Signed   By: Helyn Numbers M.D.   On: 09/11/2023 22:48   DG Foot 2 Views Left Result Date: 09/11/2023 CLINICAL DATA:  Diabetic ulcer EXAM: LEFT FOOT - 2 VIEW COMPARISON:  None Available. FINDINGS: No acute fracture or dislocation. 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 There is a soft tissue wound within the soft tissues distal to the residual first metatarsal. No definite superimposed osseous erosion. There is diffuse soft tissue swelling of the second and third digits. Posttraumatic changes noted involving the second metatarsal head. Moderate midfoot and tibiotalar degenerative arthritis. Vascular calcifications are noted. IMPRESSION: 1. 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 Soft tissue wound within the soft tissues distal to the residual first metatarsal. No definite superimposed osseous erosion. 2. Diffuse soft tissue swelling of  the second and third digits. Electronically Signed   By: Helyn Numbers M.D.   On: 09/11/2023 22:46   DG Chest 2 View Result Date: 09/11/2023 CLINICAL DATA:  Shortness of breath EXAM: CHEST - 2 VIEW COMPARISON:  08/13/2023 FINDINGS: Small bilateral pleural effusions. No frank interstitial edema. Mild vague right upper lobe opacity, suspicious for pneumonia. No pleural effusion or pneumothorax. Cardiomegaly.  Left subclavian pacemaker. Degenerative changes of the visualized thoracolumbar spine. IMPRESSION: Mild vague right upper lobe opacity, suspicious for pneumonia. Small bilateral pleural effusions. No frank interstitial edema. Electronically Signed   By: Charline Bills M.D.   On: 09/11/2023 18:09   DG Chest 2 View Result Date: 08/13/2023 CLINICAL DATA:  Shortness of breath for several weeks, initial encounter EXAM: CHEST - 2 VIEW COMPARISON:  08/03/2023 FINDINGS: Cardiac shadow is enlarged but stable. Pacing device is again seen. Mild central vascular congestion is noted without significant edema. Chronic blunting of the costophrenic angles is seen. No focal infiltrate is noted. No bony abnormality is seen. IMPRESSION: Stable vascular congestion when compare with the prior exam. No focal infiltrate is noted. Electronically Signed   By: Alcide Clever M.D.   On: 08/13/2023 20:32    ECHO 06/2023: 1. Left ventricular ejection fraction, by estimation, is 30 to 35%. The left ventricle has moderately decreased function. The left ventricle demonstrates regional wall motion abnormalities (see scoring diagram/findings for description). Indeterminate diastolic filling due to E-A fusion.   2. Right ventricular systolic function is normal. The right ventricular  size is normal.   3. The mitral valve is normal in structure. Moderate mitral valve  regurgitation. No evidence of mitral stenosis.   4. The aortic valve is normal in structure. Aortic valve regurgitation is  not visualized. No aortic stenosis is  present.   5. The inferior vena cava is normal in size with greater than 50%  respiratory variability, suggesting right atrial  pressure of 3 mmHg.   TELEMETRY reviewed by me 09/12/2023: NSR rate 90s  EKG reviewed by me: VP with PVCs rate 95 bpm  Data reviewed by me 09/12/2023: last 24h vitals tele labs imaging I/O ED provider note, admission H&P  Active Problems:   Persistent atrial fibrillation (HCC)   HTN (hypertension)   Chronic kidney disease, stage 3a (HCC)   Acute on chronic heart failure with reduced ejection fraction (HFrEF,30-35 %) (HCC)   Moderate mitral regurgitation by prior echocardiogram   Nonischemic cardiomyopathy (HCC)   Status cardiac pacemaker   Anemia due to chronic kidney disease   Uncontrolled type 2 diabetes mellitus with hyperglycemia, without long-term current use of insulin (HCC)   Chronic bilateral pleural effusions   Complete heart block (HCC)  s/p PACEMAKER   Infected plantar ulcers both feet (HCC)   Osteomyelitis of right foot (HCC)   CAP (community acquired pneumonia)    ASSESSMENT AND PLAN:  Mithran Whitson Orrison is a 77 y.o. male  with a past medical history of  chronic HFrEF, persistent atrial fibrillation, mitral insufficiency, CHB s/p PPM 2021, hypertension, chronic kidney disease stage III  who presented to the ED on 09/11/2023 for shortness of breath. Cardiology was consulted for further evaluation.   # Acute on chronic heart failure reduced EF # Acute hypoxic respiratory failure # LE cellulitis Patient presented with worsening SOB, LE edema. Also foot wounds with drainage. Seen at HF clinic yesterday, BNP 3500, sent to ED. BNP in ED 3800. Troponins 18 > 22. Did not require supplemental O2.  -IV bumex 2 mg twice daily.  -Continue home metoprolol succinate 50 mg daily and finerenone 10 mg daily. Consider addition of ARB after he has been diuresed some. -Mild and flat troponin elevation most consistent with demand/supply mismatch and not ACS in the  setting of acute heart failure. -No plan for further cardiac diagnostics at this time.   # CHB s/p Medtronic dual chamber PPM 08/2020 # Hx atrial fibrillation Patient with history of persistent atrial fibrillation previously on eliquis, this was discontinued due to hematuria in 2023. Underwent PPM implant in 2021 for CHB, has had regular device checks since implant.  -Beta-blocker as above for rate control -Recommend anticoagulation however patient has historically deferred given prior bleeding issues.  # Chronic kidney disease stage IIIa Patient with hx of CKD, baseline Cr 1.3-1.4. Cr this AM 1.45 -Continue to monitor renal function closely during diuresis.  This patient's plan of care was discussed and created with Dr. Corky Sing and he is in agreement.  Signed: Gale Journey, PA-C  09/12/2023, 1:54 PM [facility]

## 2023-09-12 NOTE — Consult Note (Signed)
Hospital Consult    Reason for Consult: Bilateral lower extremity foot wounds. Requesting Physician: Dr. Lindajo Royal, MD MRN #:  [id number]  History of Present Illness: This is a 77 y.o. male who presents to [facility] emergency department for shortness of breath and being admitted for congestive heart failure with exacerbation fluid retention responding to outpatient diuretic treatments.  Upon workup patient was noted to have bilateral lower extremity open wounds to both feet.  He was last seen by podiatry on 08/06/2023 and his ulcers have been clean at that time but are not any longer.  On exam this morning in the emergency department the patient is resting comfortably.  Denies any pain.  He does endorse diabetic neuropathy with oozing wounds to both his feet.  He states this started months ago and he has been seen by podiatry but things are not getting any better.  He would like this addressed while he is here at the hospital.  Both wounds are very malodorous and are weeping to the dressings applied.  Vascular surgery consulted to evaluate.  Past Medical History:  Diagnosis Date   (HFpEF) heart failure with preserved ejection fraction (HCC) 03/01/2020   a.) TTE 03/01/2020: EF 55-60%, mod MAC, mod AoV sclerosis, triv AR, mild TR, mod MR, RVSP 50-59; b.) TTE 09/10/2020: EF 55-60%, mod MAC, mod AoV sclerosis, mild TR, 3+ MR, RVSP 50-59; c.) TTE 09/26/2020: EF 55-60%, mild LA dil, triv PR, mild MR/TR, RVSP 37-49   Adenoma of left adrenal gland    Anemia    Arthritis    Atrial fibrillation and flutter (HCC)    a.) CHA2DS2VASc = 5 (age x2, HFpEF, HTN, T2DM);  b.) s/p CTI ablation 09/07/2020; c.) rate/rhythm maintained on oral carvedilol; not on chronic anticoagulation therapy   CAD (coronary artery disease)    Cardiomegaly    CKD (chronic kidney disease), stage III (HCC)    DOE (dyspnea on exertion)    Drug-induced bradycardia    Gangrene of toe of left foot (HCC)    a.) s/p amputation of LEFT  great toe 07/06/2014   Hepatosplenomegaly    History of bilateral cataract extraction    HLD (hyperlipidemia)    Hypertension    Long term current use of aspirin    Lymphedema of both lower extremities    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)    a.) s/p amputation 11/04/2022   Peripheral vascular disease (HCC)    Pleural effusion on right 09/09/2020   a.) s/p RIGHT thoracentesis with 2180 cc yield   Pneumonia    Presence of permanent cardiac pacemaker 09/10/2020   a.) TVP placement 09/10/2020 due to intermittent CHB in setting of urosepsis; b.) s/p PPM placement 09/15/2020: MDT Azure XT DR (SN: [id number])   Pulmonary hypertension (HCC) 03/01/2020   a.) TTE: 03/01/2020: RVSP 50-59; b.) TTE 09/10/2020: RVSP 50-59; c.) TTE 09/26/2020: RVSP 37-49   RA (rheumatoid arthritis) (HCC)    Rheumatic fever    Sepsis (HCC) 09/10/2020   a.) urosepsis --> BC x 2 sets and UC all grew out significant Proteus mirabilis; admitted to [facility] 09/07/2020 - 09/27/2020.   Sick sinus syndrome [facility])    a.) s/p MDT PPM placement 09/15/2020   T2DM (type 2 diabetes mellitus) (HCC)    Urinary retention    chronic, with indwelling Foley catheter and plans for a suprapubic   Wears dentures    full upper    Past Surgical History:  Procedure Laterality Date  AMPUTATION TOE Right 11/04/2022   Procedure: AMPUTATION TOE;  Surgeon: Felecia Shelling, [name];  [name];  Service: Podiatry;  Laterality: Right;   BONE BIOPSY Right 04/05/2023   Procedure: BONE BIOPSY THIRD & FOURTH;  Surgeon: Felecia Shelling, [name];  [name];  Service: Podiatry;  Laterality: Right;   CARDIAC ELECTROPHYSIOLOGY STUDY AND ABLATION N/A 09/07/2020   Procedure: CARDIAC EP STUDY AND ABLATION (CTI)   CATARACT EXTRACTION W/PHACO Right 01/16/2017   Procedure: CATARACT EXTRACTION PHACO AND INTRAOCULAR LENS PLACEMENT (IOC)  Right Complicated;  Surgeon: Lockie Mola, MD;  Location: [facility];   Service: Ophthalmology;  Laterality: Right;  IVA Block Healon 5 malyugin vision blue Diabetic - oral meds   CATARACT EXTRACTION W/PHACO Left 10/23/2022   Procedure: CATARACT EXTRACTION PHACO AND INTRAOCULAR LENS PLACEMENT (IOC) LEFT DIABETIC;  Surgeon: Galen Manila, MD;  Location: [facility];  Service: Ophthalmology;  Laterality: Left;  Diabetic   COLONOSCOPY     LOWER EXTREMITY ANGIOGRAPHY Right 11/02/2022   Procedure: Lower Extremity Angiography;  Surgeon: Annice Needy, MD;  Location: [facility] INVASIVE CV LAB;  Service: Cardiovascular;  Laterality: Right;   METATARSAL HEAD EXCISION Left 07/05/2018   Procedure: RESECTION FIRST METATARSAL INFECTED BONE AND SOFT TISSUE;  Surgeon: Recardo Evangelist, [name];  [name];  Service: Podiatry;  Laterality: Left;   METATARSAL HEAD EXCISION Right 04/05/2023   Procedure: METATARSAL HEAD EXCISION THIRD & FOURTH;  Surgeon: Felecia Shelling, [name];  [name];  Service: Podiatry;  Laterality: Right;   PACEMAKER INSERTION  09/15/2020   TOE AMPUTATION Left    TONSILLECTOMY      Allergies  Allergen Reactions   Eliquis [Apixaban]     Dizziness  and vision change   Spironolactone Other (See Comments)    dizziness    Prior to Admission medications   Medication Sig Start Date End Date Taking? Authorizing Provider  acetaminophen (TYLENOL) 500 MG tablet Take 2 tablets (1,000 mg total) by mouth 3 (three) times daily as needed (for pain). 08/18/23  Yes Darlin Priestly, MD  aspirin EC 81 MG tablet Take 1 tablet (81 mg total) by mouth daily. Swallow whole. 11/07/22  Yes Sunnie Nielsen, DO  bumetanide (BUMEX) 2 MG tablet Take 1 tablet (2 mg total) by mouth daily. 09/11/23  Yes Hackney, Tina A, FNP  Finerenone (KERENDIA) 10 MG TABS Take 1 tablet (10 mg total) by mouth daily. 08/18/23  Yes Darlin Priestly, MD  gentamicin cream (GARAMYCIN) 0.1 % Apply 1 Application topically 2 (two) times daily. 05/31/23  Yes Felecia Shelling, [name]  glipiZIDE (GLUCOTROL) 10  MG tablet Take 1 tablet (10 mg total) by mouth 2 (two) times daily. 08/18/23  Yes Darlin Priestly, MD  metFORMIN (GLUCOPHAGE) 1000 MG tablet Take 1 tablet (1,000 mg total) by mouth 2 (two) times daily. 08/18/23  Yes Darlin Priestly, MD  metolazone (ZAROXOLYN) 2.5 MG tablet Take 1 tablet (2.5 mg total) by mouth once a week. On Mondays Patient taking differently: Take 2.5 mg by mouth 2 (two) times a week. On Mondays & Fridays 08/18/23  Yes Darlin Priestly, MD  metoprolol succinate (TOPROL-XL) 50 MG 24 hr tablet Take 1 tablet (50 mg total) by mouth daily. Take with or immediately following a meal. 08/18/23  Yes Darlin Priestly, MD  Multiple Vitamins-Minerals (MULTIVITAMIN WITH MINERALS) tablet Take 1 tablet by mouth daily.   Yes [provider]  silver sulfADIAZINE (SILVADENE) 1 % cream Apply to affected area daily 06/25/23 06/24/24 Yes Gala Lewandowsky  M, [name]  OVER THE COUNTER MEDICATION 1 Scoop daily. Super Beets Powder    [provider]  sacubitril-valsartan (ENTRESTO) 24-26 MG Take 1 tablet by mouth 2 (two) times daily. Patient not taking: Reported on 09/11/2023 08/18/23   Darlin Priestly, MD    Social History   Socioeconomic History   Marital status: Single    Spouse name: Not on file   Number of children: Not on file   Years of education: Not on file   Highest education level: Not on file  Occupational History    Comment: [employer]  Tobacco Use   Smoking status: Never   Smokeless tobacco: Never  Vaping Use   Vaping status: Never Used  Substance and Sexual Activity   Alcohol use: Not Currently    Comment: rarely   Drug use: Never   Sexual activity: Not Currently    Birth control/protection: None  Other Topics Concern   Not on file  Social History Narrative   Lives with roommate   Social Drivers of Health   Financial Resource Strain: Low Risk  (03/20/2023)   Received from [facility], [facility]   Overall Financial Resource Strain (CARDIA)    Difficulty of Paying Living  Expenses: Not hard at all  Food Insecurity: No Food Insecurity (08/14/2023)   Hunger Vital Sign    Worried About Running Out of Food in the Last Year: Never true    Ran Out of Food in the Last Year: Never true  Transportation Needs: No Transportation Needs (08/14/2023)   PRAPARE - Administrator, Civil Service (Medical): No    Lack of Transportation (Non-Medical): No  Physical Activity: Not on file  Stress: No Stress Concern Present (09/07/2020)   Received from [facility], [facility] of Occupational [facility] - Occupational Stress Questionnaire    Feeling of Stress : Not at all  Social Connections: Unknown (02/01/2022)   Received from [facility], [facility]   Social Network    Social Network: Not on file  Intimate Partner Violence: Not At Risk (08/14/2023)   Humiliation, Afraid, Rape, and Kick questionnaire    Fear of Current or Ex-Partner: No    Emotionally Abused: No    Physically Abused: No    Sexually Abused: No     Family History  Problem Relation Age of Onset   Cancer Niece     ROS: Otherwise negative unless mentioned in HPI  Physical Examination  Vitals:   09/12/23 0200 09/12/23 0602  BP: 124/72 121/73  Pulse: (!) 101 94  Resp: (!) 31 (!) 29  Temp:  97.8 F (36.6 C)  SpO2: 93% 97%   Body mass index is 30.05 kg/m.  General:  WDWN in NAD Gait: Not observed HENT: WNL, normocephalic Pulmonary: normal non-labored breathing, without Rales, rhonchi,  wheezing Cardiac: irregular, history of atrial fibrillation., without  Murmurs, rubs or gallops; without carotid bruits Abdomen: Positive bowel sounds throughout, soft, NT/ND, no masses Skin: without rashes Vascular Exam/Pulses: Patient noted to have very weak Doppler PT pulses with weak DP pulse on the right and no DP pulse on the left.  Noted large ulcer with the medial and plantar right great toe which extends to the cortex of the base of the distal phalanx. Extremities:  with ischemic changes, with Gangrene , with cellulitis; with open wounds;  Musculoskeletal: no muscle wasting or atrophy prior history of transmetatarsal amputation of the right third toe and resection of the right fourth  toe.  Neurologic: A&O X 3;  No focal weakness or paresthesias are detected; speech is fluent/normal Psychiatric:  The pt has Normal affect. Lymph:  Unremarkable  CBC    Component Value Date/Time   WBC 9.9 09/12/2023 0340   RBC 3.46 (L) 09/12/2023 0340   HGB 9.4 (L) 09/12/2023 0340   HGB 11.1 (L) 07/09/2014 0452   HCT 30.9 (L) 09/12/2023 0340   HCT 34.3 (L) 07/09/2014 0452   PLT 234 09/12/2023 0340   PLT 358 07/09/2014 0452   MCV 89.3 09/12/2023 0340   MCV 90 07/09/2014 0452   MCH 27.2 09/12/2023 0340   MCHC 30.4 09/12/2023 0340   RDW 16.5 (H) 09/12/2023 0340   RDW 12.4 07/09/2014 0452   LYMPHSABS 0.9 09/11/2023 1627   LYMPHSABS 1.5 07/09/2014 0452   MONOABS 0.7 09/11/2023 1627   MONOABS 0.7 07/09/2014 0452   EOSABS 0.2 09/11/2023 1627   EOSABS 0.3 07/09/2014 0452   BASOSABS 0.1 09/11/2023 1627   BASOSABS 0.1 07/09/2014 0452    BMET    Component Value Date/Time   NA 135 09/12/2023 0340   NA 142 07/10/2014 0549   K 4.2 09/12/2023 0340   K 3.7 07/10/2014 0549   CL 103 09/12/2023 0340   CL 109 (H) 07/10/2014 0549   CO2 21 (L) 09/12/2023 0340   CO2 24 07/10/2014 0549   GLUCOSE 237 (H) 09/12/2023 0340   GLUCOSE 134 (H) 07/10/2014 0549   BUN 51 (H) 09/12/2023 0340   BUN 10 07/10/2014 0549   CREATININE 1.45 (H) 09/12/2023 0340   CREATININE 1.26 07/10/2014 0549   CALCIUM 8.6 (L) 09/12/2023 0340   CALCIUM 8.3 (L) 07/10/2014 0549   GFRNONAA 49 (L) 09/12/2023 0340   GFRNONAA >60 07/10/2014 0549   GFRAA >60 07/07/2018 0440   GFRAA >60 07/10/2014 0549    COAGS: Lab Results  Component Value Date   INR 1.2 11/05/2022   INR 1.27 07/04/2018   INR 1.1 07/05/2014     Non-Invasive Vascular Imaging:   EXAM: LEFT FOOT - 2 VIEW   COMPARISON:  None  Available.   FINDINGS: No acute fracture or dislocation. 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 There is a soft tissue wound within the soft tissues distal to the residual first metatarsal. No definite superimposed osseous erosion. There is diffuse soft tissue swelling of the second and third digits. Posttraumatic changes noted involving the second metatarsal head. Moderate midfoot and tibiotalar degenerative arthritis. Vascular calcifications are noted.   IMPRESSION: 1. 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 Soft tissue wound within the soft tissues distal to the residual first metatarsal. No definite superimposed osseous erosion. 2. Diffuse soft tissue swelling of the second and third digits  EXAM: RIGHT FOOT - 2 VIEW   COMPARISON:  None Available.   FINDINGS: Transmetatarsal amputation of the right third toe and resection of the right fourth metatarsal head. Large ulcer is seen within the medial and plantar right great toe which extends to the cortex of the base of the distal phalanx with osseous erosion in this location in keeping with changes of osteomyelitis. There is extensive superimposed soft tissue swelling right great toe and second toe as well as moderate soft tissue swelling of the right forefoot diffusely. Vascular calcifications are noted. Advanced degenerative arthritis noted of the first metatarsophalangeal joint. No acute fracture or dislocation.   IMPRESSION: 1. Large ulcer within the medial and plantar right great toe which extends to the cortex of the base of  the distal phalanx with osseous erosion in this location in keeping with changes of osteomyelitis. 2. Transmetatarsal amputation of the right third toe and resection of the right fourth metatarsal head.  Statin:  No. Beta Blocker:  Yes.   Aspirin:  Yes.   ACEI:  No. ARB:  Yes.   CCB use:  No Other antiplatelets/anticoagulants:  No. HX of Atrial Fibrillation  not on Anticoagulation    ASSESSMENT/PLAN: This is a 77 y.o. male who presents to [facility] emergency department for exacerbation of CHF with fluid overload not responding to outpatient heart failure treatment.  Upon workup he was noted to have bilateral lower extremity open wounds.  Patient is being followed by podiatry and was last seen in the beginning of November.  Patient requests we address his lower extremity open wounds while in the hospital.  Plan Vascular surgery plans on taking the patient to the vascular lab tomorrow 09/13/2023 for right lower extremity angiogram with possible intervention due to osteomyelitis.  I discussed in detail today at the bedside in the emergency room with the patient the procedure, benefits, risk, and complications.  He verbalizes understanding and wishes to proceed.  I answered all of the patient's questions today.  Patient was made n.p.o. after midnight tonight for procedure tomorrow.   -I discussed the plan in detail with Dr. Levora Dredge MD and he agrees with the plan.   Marcie Bal [facility] 09/12/2023 7:01 AM

## 2023-09-12 NOTE — Progress Notes (Signed)
This patient has a Azure Medtronic MRI compatible pacemaker  RA Lead S# 5076  System S# [id number]  RV Lead S#5076 System S# [id number]

## 2023-09-12 NOTE — ED Notes (Signed)
NPO Status: Fluid 0200 Food 0200

## 2023-09-12 NOTE — Assessment & Plan Note (Signed)
Patient with several week history of productive cough Patient with low-grade temp of 99 leukocytosis of 11,000 chest x-ray showing right upper lobe opacity suspicious for pneumonia Received Rocephin and azithromycin in the ED Receiving Rocephin and vancomycin for osteomyelitis Pro-Calc 0.12 Follow-up respiratory viral panel

## 2023-09-12 NOTE — Progress Notes (Signed)
Heart Failure Stewardship Pharmacy Note  PCP: Elder Negus, NP PCP-Cardiologist: None  HPI: Tyler [name] is a 77 y.o. male with HTN, HLD, DM, PVD, SSS (s/p PPM), dCHF, CKD-3A, obesity, rheumatic fever, atrial fibrillation not on anticoagulants, chronic diabetic foot ulcers, s/p third metatarsal head resection with bone biopsy right foot who presented with worsening dyspnea, weight gain, PND, and orthopnea. Has been ED visits this year. Troponin on admission was 18. BNP on admission was elevated at 3191.5 CXR on admission showed stable vascular congestion.    Pertinent cardiac history: Long term holter in 03/2020 showed 6.1% PVC burden and multiple AF episodes. Ablation performed and pacemaker placed in 08/2020. Stress test 03/2023 showed no evidence of ischemia and LVEF of 57%. TTE in 08/2020 and 09/2020 showed LVEF of 55-60%. Echo 07/03/23 showed LVEF of 30-35% and moderate MR.   Pertinent Lab Values: Creatinine  Date Value Ref Range Status  07/10/2014 1.26 0.60 - 1.30 mg/dL Final   Creatinine, Ser  Date Value Ref Range Status  09/12/2023 1.45 (H) 0.61 - 1.24 mg/dL Final   BUN  Date Value Ref Range Status  09/12/2023 51 (H) 8 - 23 mg/dL Final  16/06/9603 10 7 - 18 mg/dL Final   Potassium  Date Value Ref Range Status  09/12/2023 4.2 3.5 - 5.1 mmol/L Final  07/10/2014 3.7 3.5 - 5.1 mmol/L Final   Sodium  Date Value Ref Range Status  09/12/2023 135 135 - 145 mmol/L Final  07/10/2014 142 136 - 145 mmol/L Final   B Natriuretic Peptide  Date Value Ref Range Status  09/11/2023 3,874.2 (H) 0.0 - 100.0 pg/mL Final    Comment:    Performed at [facility], [address] [place], [place] [address]   Magnesium  Date Value Ref Range Status  08/17/2023 2.3 1.7 - 2.4 mg/dL Final    Comment:    Performed at [facility], [address] [place], [place] [address]  07/10/2014 1.8 mg/dL Final    Comment:    7.8-2.9 THERAPEUTIC RANGE: 4-7 mg/dL TOXIC: > 10  mg/dL  -----------------------    Hemoglobin A1C  Date Value Ref Range Status  07/05/2014 10.4 (H) 4.2 - 6.3 % Final    Comment:    The American Diabetes Association recommends that a primary goal of therapy should be <7% and that physicians should reevaluate the treatment regimen in patients with HbA1c values consistently >8%. Hemoglobin A1c - ** THIS IS A CORRECTED REPORT **  - PLEASE DISREGARD PREVIOUS RESULT.  - Corrected report called to and read back  - by Crista Elliot on 1C @ 1155 07/06/14.  - BGB    Hgb A1c MFr Bld  Date Value Ref Range Status  07/02/2023 7.2 (H) 4.8 - 5.6 % Final    Comment:    (NOTE) Pre diabetes:          5.7%-6.4%  Diabetes:              >6.4%  Glycemic control for   <7.0% adults with diabetes    TSH  Date Value Ref Range Status  06/28/2020 1.726 0.350 - 4.500 uIU/mL Final    Comment:    Performed by a 3rd Generation assay with a functional sensitivity of <=0.01 uIU/mL. Performed at [facility], [address] [place], [place] [address]     Vital Signs: Admission weight: none Temp:  [97.8 F (36.6 C)-99 F (37.2 C)] 98.1 F (36.7 C) (12/19 1051) Pulse Rate:  [53-109] 94 (  12/19 1030) Cardiac Rhythm: Normal sinus rhythm (12/19 0743) Resp:  [19-34] 19 (12/19 1030) BP: (114-139)/(67-89) 139/81 (12/19 1030) SpO2:  [93 %-100 %] 99 % (12/19 1030) Weight:  [117.9 kg (260 lb)] 117.9 kg (260 lb) (12/19 1234)  Intake/Output Summary (Last 24 hours) at 09/12/2023 1352 Last data filed at 09/12/2023 0742 Gross per 24 hour  Intake 900 ml  Output 1400 ml  Net -500 ml   Current Heart Failure Medications:  Loop diuretic: bumetanide 2 mg IV BID Beta-Blocker: metoprolol succinate 50 mg daily ACEI/ARB/ARNI: losartan 25 mg daily MRA: none SGLT2i: none   Prior to admission Heart Failure Medications:  Loop diuretic: bumetanide 2 mg daily Beta-Blocker: metoprolol succinate 50 mg daily ACEI/ARB/ARNI: none MRA: none SGLT2i:  none Other: metolazone 2.5 mg twice weekly   Assessment: 1. 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 NYHA class III symptoms.  -Symptoms: Shortness of breath has worsened over the last few weeks despite outpatient changes in diuretics. LEE is present, especially in feet, however this could be a part of his underlying infectious process. Appetite has been poor. -Volume: Appears to be hypervolemic on exam with correlates with BNP on admission. Currently on bumetanide 2 mg IV q12h.  -Hemodynamics: BP improved to 120-130s/70-80s with HR in 80-90s. -BB: Previously did not tolerate carvedilol due to blurred vision, now on metoprolol succinate 50 mg daily.  -ACE/ARB/ARNI: Did not tolerate Entresto due to dizziness. Can consider restarting losartan. -MRA: Finerenone reordered inpatient. Since this is not on formulary, patient would need to provide supply. -SGLT2i: Not a candidate given chronic foley catheter.  -Given AF and CHADSVASc >3, may benefit from chronic anticoagulation, however with history of bleeding, patient insisted staying off anticoagulation.  -F/u MRI results for infectious work-up.  Plan: 1) Medication changes recommended at this time: -None  2) Patient assistance: -Sherryll Burger and [name] are $0   3) Education: - Patient has been educated on current HF medications and potential additions to HF medication regimen - Patient verbalizes understanding that over the next few months, these medication doses may change and more medications may be added to optimize HF regimen - Patient has been educated on basic disease state pathophysiology and goals of therapy   Medication Assistance / Insurance Benefits Check: Does the patient have prescription insurance? Prescription Insurance: Commercial   Type of insurance plan:     Outpatient Pharmacy: Prior to admission outpatient pharmacy: [facility] Please do not hesitate to reach out with questions or concerns,    Enos Fling, PharmD, CPP, BCPS Heart Failure Pharmacist  Phone - [phone] 08/16/2023 7:35 AM

## 2023-09-12 NOTE — TOC Initial Note (Signed)
Transition of Care [facility]) - Initial/Assessment Note    Patient Details  Name: Tyler [name] MRN: [id number] Date of Birth: 1946/01/18  Transition of Care [facility]) CM/SW Contact:    Marquita Palms, LCSW Phone Number: 09/12/2023, 12:13 PM  Clinical Narrative:                  CSW met with patient bedside. Patient complaining about foot being infected and being upset with the process. Patient called his daughter while CSW was talking to patient. CSW explained that if patient needed anything to let CSW know.       Patient Goals and CMS Choice            Expected Discharge Plan and Services         Expected Discharge Date: 09/12/23                                    Prior Living Arrangements/Services                       Activities of Daily Living      Permission Sought/Granted                  Emotional Assessment              Admission diagnosis:  CHF exacerbation [facility]) [I50.9] Patient Active Problem List   Diagnosis Date Noted   Infected plantar ulcers both feet (HCC) 09/12/2023   Osteomyelitis of right foot (HCC) 09/12/2023   CAP (community acquired pneumonia) 09/12/2023   Moderate mitral regurgitation by prior echocardiogram 09/11/2023   Nonischemic cardiomyopathy (HCC) 09/11/2023   Status cardiac pacemaker 09/11/2023   Anemia due to chronic kidney disease 09/11/2023   Uncontrolled type 2 diabetes mellitus with hyperglycemia, without long-term current use of insulin (HCC) 09/11/2023   Chronic bilateral pleural effusions 09/11/2023   Complete heart block (HCC)  s/p PACEMAKER 09/11/2023   Type 2 diabetes mellitus with chronic kidney disease, without long-term current use of insulin (HCC) 08/14/2023   Acute on chronic heart failure with reduced ejection fraction (HFrEF,30-35 %) (HCC) 08/13/2023   Ulcerated, foot, right, with fat layer exposed (HCC) 07/06/2023   Ulcer of left foot with fat layer exposed (HCC) 07/06/2023   Acute  on chronic diastolic CHF (congestive heart failure) (HCC) 07/02/2023   Type II diabetes mellitus with renal manifestations (HCC) 07/02/2023   Chronic kidney disease, stage 3a (HCC) 07/02/2023   Myocardial injury 07/02/2023   Cellulitis of right lower extremity 07/02/2023   Epistaxis 11/05/2022   Foot infection 11/03/2022   Cellulitis of right foot 10/31/2022   Type 2 diabetes mellitus with complication, without long-term current use of insulin (HCC) 10/31/2022   Persistent atrial fibrillation (HCC) 10/31/2022   HTN (hypertension) 10/31/2022   CKD (chronic kidney disease), stage III (HCC) 10/31/2022   Obesity (BMI 30-39.9) 10/31/2022   Gangrene of toe of right foot (HCC) 10/31/2022   PAD (peripheral artery disease) (HCC) 10/31/2022   Right foot infection 10/30/2022   Syncope and collapse 06/28/2020   Acute CHF (congestive heart failure) (HCC) 06/28/2020   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   Diabetes mellitus without complication (HCC)    Sepsis (HCC) 07/03/2018   PCP:  Elder Negus, NP Pharmacy:   [facility] [phone] [address], [place] - [address] [place] [address] Phone: [phone] Fax: [phone]  [facility] -  [facility] [address] [place] [address] Phone: [phone] Fax: [phone]     Social Drivers of Health (SDOH) Social History: SDOH Screenings   Food Insecurity: No Food Insecurity (08/14/2023)  Housing: Low Risk  (08/14/2023)  Transportation Needs: No Transportation Needs (08/14/2023)  Utilities: Not At Risk (08/14/2023)  Alcohol Screen: Low Risk  (08/14/2023)  Financial Resource Strain: Low Risk  (03/20/2023)   Received from [facility], [facility]  Social Connections: Unknown (02/01/2022)   Received from [facility], [facility]  Stress: No Stress Concern Present (09/07/2020)   Received from [facility], [facility]  Tobacco Use: Low Risk  (09/11/2023)   SDOH  Interventions:     Readmission Risk Interventions    09/12/2023   12:13 PM 08/15/2023    3:57 PM  Readmission Risk Prevention Plan  Transportation Screening Complete Complete  PCP or Specialist Appt within 3-5 Days Complete Complete  HRI or Home Care Consult Complete Complete  Social Work Consult for Recovery Care Planning/Counseling Complete Complete  Palliative Care Screening Not Applicable Not Applicable  Medication Review Ocean[facility]) Complete Complete

## 2023-09-12 NOTE — Progress Notes (Signed)
Education Assessment and Provision:  Detailed education review and instructions provided on heart failure disease management including the following:  Signs and symptoms of Heart Failure When to call the physician Importance of daily weights Low sodium diet Fluid restriction Medication management Anticipated future follow-up appointments-Pt has a TOC Follow-Up with Clarisa Kindred on 09/27/23 @ 9:30 AM. Pt is also a [facility] patient and will be given a follow-up with them prior to discharge.  Patient encouraged today to make sure he is attending appointments wherever he is comfortable on a regular basis. Patient verbalized an understanding of that.   Patient education given on each of the above topics.  Patient acknowledges understanding via teach back method and acceptance of all instructions.  Patient has scale at home: Yes Patient has pill box at home: Yes    Roxy Horseman, RN, BSN [facility] Heart Failure Navigator Secure Chat Only

## 2023-09-12 NOTE — H&P (View-Only) (Signed)
 Hospital Consult    Reason for Consult: Bilateral lower extremity foot wounds. Requesting Physician: Dr. Lindajo Royal, MD MRN #:  [id number]  History of Present Illness: This is a 77 y.o. male who presents to [facility] emergency department for shortness of breath and being admitted for congestive heart failure with exacerbation fluid retention responding to outpatient diuretic treatments.  Upon workup patient was noted to have bilateral lower extremity open wounds to both feet.  He was last seen by podiatry on 08/06/2023 and his ulcers have been clean at that time but are not any longer.  On exam this morning in the emergency department the patient is resting comfortably.  Denies any pain.  He does endorse diabetic neuropathy with oozing wounds to both his feet.  He states this started months ago and he has been seen by podiatry but things are not getting any better.  He would like this addressed while he is here at the hospital.  Both wounds are very malodorous and are weeping to the dressings applied.  Vascular surgery consulted to evaluate.  Past Medical History:  Diagnosis Date   (HFpEF) heart failure with preserved ejection fraction (HCC) 03/01/2020   a.) TTE 03/01/2020: EF 55-60%, mod MAC, mod AoV sclerosis, triv AR, mild TR, mod MR, RVSP 50-59; b.) TTE 09/10/2020: EF 55-60%, mod MAC, mod AoV sclerosis, mild TR, 3+ MR, RVSP 50-59; c.) TTE 09/26/2020: EF 55-60%, mild LA dil, triv PR, mild MR/TR, RVSP 37-49   Adenoma of left adrenal gland    Anemia    Arthritis    Atrial fibrillation and flutter (HCC)    a.) CHA2DS2VASc = 5 (age x2, HFpEF, HTN, T2DM);  b.) s/p CTI ablation 09/07/2020; c.) rate/rhythm maintained on oral carvedilol; not on chronic anticoagulation therapy   CAD (coronary artery disease)    Cardiomegaly    CKD (chronic kidney disease), stage III (HCC)    DOE (dyspnea on exertion)    Drug-induced bradycardia    Gangrene of toe of left foot (HCC)    a.) s/p amputation of LEFT  great toe 07/06/2014   Hepatosplenomegaly    History of bilateral cataract extraction    HLD (hyperlipidemia)    Hypertension    Long term current use of aspirin    Lymphedema of both lower extremities    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)    a.) s/p amputation 11/04/2022   Peripheral vascular disease (HCC)    Pleural effusion on right 09/09/2020   a.) s/p RIGHT thoracentesis with 2180 cc yield   Pneumonia    Presence of permanent cardiac pacemaker 09/10/2020   a.) TVP placement 09/10/2020 due to intermittent CHB in setting of urosepsis; b.) s/p PPM placement 09/15/2020: MDT Azure XT DR (SN: [id number])   Pulmonary hypertension (HCC) 03/01/2020   a.) TTE: 03/01/2020: RVSP 50-59; b.) TTE 09/10/2020: RVSP 50-59; c.) TTE 09/26/2020: RVSP 37-49   RA (rheumatoid arthritis) (HCC)    Rheumatic fever    Sepsis (HCC) 09/10/2020   a.) urosepsis --> BC x 2 sets and UC all grew out significant Proteus mirabilis; admitted to [facility] 09/07/2020 - 09/27/2020.   Sick sinus syndrome [facility])    a.) s/p MDT PPM placement 09/15/2020   T2DM (type 2 diabetes mellitus) (HCC)    Urinary retention    chronic, with indwelling Foley catheter and plans for a suprapubic   Wears dentures    full upper    Past Surgical History:  Procedure Laterality Date  AMPUTATION TOE Right 11/04/2022   Procedure: AMPUTATION TOE;  Surgeon: Felecia Shelling, [name];  [name];  Service: Podiatry;  Laterality: Right;   BONE BIOPSY Right 04/05/2023   Procedure: BONE BIOPSY THIRD & FOURTH;  Surgeon: Felecia Shelling, [name];  [name];  Service: Podiatry;  Laterality: Right;   CARDIAC ELECTROPHYSIOLOGY STUDY AND ABLATION N/A 09/07/2020   Procedure: CARDIAC EP STUDY AND ABLATION (CTI)   CATARACT EXTRACTION W/PHACO Right 01/16/2017   Procedure: CATARACT EXTRACTION PHACO AND INTRAOCULAR LENS PLACEMENT (IOC)  Right Complicated;  Surgeon: Lockie Mola, MD;  Location: [facility];   Service: Ophthalmology;  Laterality: Right;  IVA Block Healon 5 malyugin vision blue Diabetic - oral meds   CATARACT EXTRACTION W/PHACO Left 10/23/2022   Procedure: CATARACT EXTRACTION PHACO AND INTRAOCULAR LENS PLACEMENT (IOC) LEFT DIABETIC;  Surgeon: Galen Manila, MD;  Location: [facility];  Service: Ophthalmology;  Laterality: Left;  Diabetic   COLONOSCOPY     LOWER EXTREMITY ANGIOGRAPHY Right 11/02/2022   Procedure: Lower Extremity Angiography;  Surgeon: Annice Needy, MD;  Location: [facility] INVASIVE [facility] LAB;  Service: Cardiovascular;  Laterality: Right;   METATARSAL HEAD EXCISION Left 07/05/2018   Procedure: RESECTION FIRST METATARSAL INFECTED BONE AND SOFT TISSUE;  Surgeon: Recardo Evangelist, [name];  [name];  Service: Podiatry;  Laterality: Left;   METATARSAL HEAD EXCISION Right 04/05/2023   Procedure: METATARSAL HEAD EXCISION THIRD & FOURTH;  Surgeon: Felecia Shelling, [name];  [name];  Service: Podiatry;  Laterality: Right;   PACEMAKER INSERTION  09/15/2020   TOE AMPUTATION Left    TONSILLECTOMY      Allergies  Allergen Reactions   Eliquis [Apixaban]     Dizziness  and vision change   Spironolactone Other (See Comments)    dizziness    Prior to Admission medications   Medication Sig Start Date End Date Taking? Authorizing Provider  acetaminophen (TYLENOL) 500 MG tablet Take 2 tablets (1,000 mg total) by mouth 3 (three) times daily as needed (for pain). 08/18/23  Yes Darlin Priestly, MD  aspirin EC 81 MG tablet Take 1 tablet (81 mg total) by mouth daily. Swallow whole. 11/07/22  Yes Sunnie Nielsen, DO  bumetanide (BUMEX) 2 MG tablet Take 1 tablet (2 mg total) by mouth daily. 09/11/23  Yes Hackney, Tina A, FNP  Finerenone (KERENDIA) 10 MG TABS Take 1 tablet (10 mg total) by mouth daily. 08/18/23  Yes Darlin Priestly, MD  gentamicin cream (GARAMYCIN) 0.1 % Apply 1 Application topically 2 (two) times daily. 05/31/23  Yes Felecia Shelling, [name]  glipiZIDE (GLUCOTROL) 10  MG tablet Take 1 tablet (10 mg total) by mouth 2 (two) times daily. 08/18/23  Yes Darlin Priestly, MD  metFORMIN (GLUCOPHAGE) 1000 MG tablet Take 1 tablet (1,000 mg total) by mouth 2 (two) times daily. 08/18/23  Yes Darlin Priestly, MD  metolazone (ZAROXOLYN) 2.5 MG tablet Take 1 tablet (2.5 mg total) by mouth once a week. On Mondays Patient taking differently: Take 2.5 mg by mouth 2 (two) times a week. On Mondays & Fridays 08/18/23  Yes Darlin Priestly, MD  metoprolol succinate (TOPROL-XL) 50 MG 24 hr tablet Take 1 tablet (50 mg total) by mouth daily. Take with or immediately following a meal. 08/18/23  Yes Darlin Priestly, MD  Multiple Vitamins-Minerals (MULTIVITAMIN WITH MINERALS) tablet Take 1 tablet by mouth daily.   Yes [provider]  silver sulfADIAZINE (SILVADENE) 1 % cream Apply to affected area daily 06/25/23 06/24/24 Yes Gala Lewandowsky  M, [name]  OVER THE COUNTER MEDICATION 1 Scoop daily. Super Beets Powder    [provider]  sacubitril-valsartan (ENTRESTO) 24-26 MG Take 1 tablet by mouth 2 (two) times daily. Patient not taking: Reported on 09/11/2023 08/18/23   Darlin Priestly, MD    Social History   Socioeconomic History   Marital status: Single    Spouse name: Not on file   Number of children: Not on file   Years of education: Not on file   Highest education level: Not on file  Occupational History    Comment: [employer]  Tobacco Use   Smoking status: Never   Smokeless tobacco: Never  Vaping Use   Vaping status: Never Used  Substance and Sexual Activity   Alcohol use: Not Currently    Comment: rarely   Drug use: Never   Sexual activity: Not Currently    Birth control/protection: None  Other Topics Concern   Not on file  Social History Narrative   Lives with roommate   Social Drivers of Health   Financial Resource Strain: Low Risk  (03/20/2023)   Received from [facility], [facility]   Overall Financial Resource Strain (CARDIA)    Difficulty of Paying Living  Expenses: Not hard at all  Food Insecurity: No Food Insecurity (08/14/2023)   Hunger Vital Sign    Worried About Running Out of Food in the Last Year: Never true    Ran Out of Food in the Last Year: Never true  Transportation Needs: No Transportation Needs (08/14/2023)   PRAPARE - Administrator, Civil Service (Medical): No    Lack of Transportation (Non-Medical): No  Physical Activity: Not on file  Stress: No Stress Concern Present (09/07/2020)   Received from [facility], [facility] of Occupational Health - Occupational Stress Questionnaire    Feeling of Stress : Not at all  Social Connections: Unknown (02/01/2022)   Received from [facility], [facility]   Social Network    Social Network: Not on file  Intimate Partner Violence: Not At Risk (08/14/2023)   Humiliation, Afraid, Rape, and Kick questionnaire    Fear of Current or Ex-Partner: No    Emotionally Abused: No    Physically Abused: No    Sexually Abused: No     Family History  Problem Relation Age of Onset   Cancer Niece     ROS: Otherwise negative unless mentioned in HPI  Physical Examination  Vitals:   09/12/23 0200 09/12/23 0602  BP: 124/72 121/73  Pulse: (!) 101 94  Resp: (!) 31 (!) 29  Temp:  97.8 F (36.6 C)  SpO2: 93% 97%   Body mass index is 30.05 kg/m.  General:  WDWN in NAD Gait: Not observed HENT: WNL, normocephalic Pulmonary: normal non-labored breathing, without Rales, rhonchi,  wheezing Cardiac: irregular, history of atrial fibrillation., without  Murmurs, rubs or gallops; without carotid bruits Abdomen: Positive bowel sounds throughout, soft, NT/ND, no masses Skin: without rashes Vascular Exam/Pulses: Patient noted to have very weak Doppler PT pulses with weak DP pulse on the right and no DP pulse on the left.  Noted large ulcer with the medial and plantar right great toe which extends to the cortex of the base of the distal phalanx. Extremities:  with ischemic changes, with Gangrene , with cellulitis; with open wounds;  Musculoskeletal: no muscle wasting or atrophy prior history of transmetatarsal amputation of the right third toe and resection of the right fourth  toe.  Neurologic: A&O X 3;  No focal weakness or paresthesias are detected; speech is fluent/normal Psychiatric:  The pt has Normal affect. Lymph:  Unremarkable  CBC    Component Value Date/Time   WBC 9.9 09/12/2023 0340   RBC 3.46 (L) 09/12/2023 0340   HGB 9.4 (L) 09/12/2023 0340   HGB 11.1 (L) 07/09/2014 0452   HCT 30.9 (L) 09/12/2023 0340   HCT 34.3 (L) 07/09/2014 0452   PLT 234 09/12/2023 0340   PLT 358 07/09/2014 0452   MCV 89.3 09/12/2023 0340   MCV 90 07/09/2014 0452   MCH 27.2 09/12/2023 0340   MCHC 30.4 09/12/2023 0340   RDW 16.5 (H) 09/12/2023 0340   RDW 12.4 07/09/2014 0452   LYMPHSABS 0.9 09/11/2023 1627   LYMPHSABS 1.5 07/09/2014 0452   MONOABS 0.7 09/11/2023 1627   MONOABS 0.7 07/09/2014 0452   EOSABS 0.2 09/11/2023 1627   EOSABS 0.3 07/09/2014 0452   BASOSABS 0.1 09/11/2023 1627   BASOSABS 0.1 07/09/2014 0452    BMET    Component Value Date/Time   NA 135 09/12/2023 0340   NA 142 07/10/2014 0549   K 4.2 09/12/2023 0340   K 3.7 07/10/2014 0549   CL 103 09/12/2023 0340   CL 109 (H) 07/10/2014 0549   CO2 21 (L) 09/12/2023 0340   CO2 24 07/10/2014 0549   GLUCOSE 237 (H) 09/12/2023 0340   GLUCOSE 134 (H) 07/10/2014 0549   BUN 51 (H) 09/12/2023 0340   BUN 10 07/10/2014 0549   CREATININE 1.45 (H) 09/12/2023 0340   CREATININE 1.26 07/10/2014 0549   CALCIUM 8.6 (L) 09/12/2023 0340   CALCIUM 8.3 (L) 07/10/2014 0549   GFRNONAA 49 (L) 09/12/2023 0340   GFRNONAA >60 07/10/2014 0549   GFRAA >60 07/07/2018 0440   GFRAA >60 07/10/2014 0549    COAGS: Lab Results  Component Value Date   INR 1.2 11/05/2022   INR 1.27 07/04/2018   INR 1.1 07/05/2014     Non-Invasive Vascular Imaging:   EXAM: LEFT FOOT - 2 VIEW   COMPARISON:  None  Available.   FINDINGS: No acute fracture or dislocation. 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 There is a soft tissue wound within the soft tissues distal to the residual first metatarsal. No definite superimposed osseous erosion. There is diffuse soft tissue swelling of the second and third digits. Posttraumatic changes noted involving the second metatarsal head. Moderate midfoot and tibiotalar degenerative arthritis. Vascular calcifications are noted.   IMPRESSION: 1. 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 Soft tissue wound within the soft tissues distal to the residual first metatarsal. No definite superimposed osseous erosion. 2. Diffuse soft tissue swelling of the second and third digits  EXAM: RIGHT FOOT - 2 VIEW   COMPARISON:  None Available.   FINDINGS: Transmetatarsal amputation of the right third toe and resection of the right fourth metatarsal head. Large ulcer is seen within the medial and plantar right great toe which extends to the cortex of the base of the distal phalanx with osseous erosion in this location in keeping with changes of osteomyelitis. There is extensive superimposed soft tissue swelling right great toe and second toe as well as moderate soft tissue swelling of the right forefoot diffusely. Vascular calcifications are noted. Advanced degenerative arthritis noted of the first metatarsophalangeal joint. No acute fracture or dislocation.   IMPRESSION: 1. Large ulcer within the medial and plantar right great toe which extends to the cortex of the base of  the distal phalanx with osseous erosion in this location in keeping with changes of osteomyelitis. 2. Transmetatarsal amputation of the right third toe and resection of the right fourth metatarsal head.  Statin:  No. Beta Blocker:  Yes.   Aspirin:  Yes.   ACEI:  No. ARB:  Yes.   CCB use:  No Other antiplatelets/anticoagulants:  No. HX of Atrial Fibrillation  not on Anticoagulation    ASSESSMENT/PLAN: This is a 77 y.o. male who presents to [facility] emergency department for exacerbation of CHF with fluid overload not responding to outpatient heart failure treatment.  Upon workup he was noted to have bilateral lower extremity open wounds.  Patient is being followed by podiatry and was last seen in the beginning of November.  Patient requests we address his lower extremity open wounds while in the hospital.  Plan Vascular surgery plans on taking the patient to the vascular lab tomorrow 09/13/2023 for right lower extremity angiogram with possible intervention due to osteomyelitis.  I discussed in detail today at the bedside in the emergency room with the patient the procedure, benefits, risk, and complications.  He verbalizes understanding and wishes to proceed.  I answered all of the patient's questions today.  Patient was made n.p.o. after midnight tonight for procedure tomorrow.   -I discussed the plan in detail with Dr. Levora Dredge MD and he agrees with the plan.   Marcie Bal [facility] 09/12/2023 7:01 AM

## 2023-09-12 NOTE — Discharge Summary (Addendum)
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,30-35 %) (HCC)   Moderate mitral regurgitation by prior echocardiogram   Nonischemic cardiomyopathy (HCC)   Chronic bilateral pleural effusions   CAP (community acquired pneumonia)   Persistent atrial fibrillation (HCC)   Infected plantar ulcers both feet (HCC)   Osteomyelitis of right foot (HCC)   HTN (hypertension)   Uncontrolled type 2 diabetes mellitus with hyperglycemia, without long-term current use of insulin (HCC)   Complete heart block (HCC)  s/p PACEMAKER   Chronic kidney disease, stage 3a (HCC)   Anemia due to chronic kidney disease   Status cardiac pacemaker  Resolved Problems:   * No resolved hospital problems. *  Hospital Course:  Tyler [name] is a 77 y.o. 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  He presented to the hospital because of cough productive of yellowish phlegm, increasing shortness of breath, orthopnea, easy fatigability, about 10 pound weight gain in a week.  He was hospitalized on 08/13/2023 for CHF exacerbation.  His diuretic regimen had been adjusted in the outpatient setting but his symptoms did not improve. He also complained of oozing from the wounds on the bottom of his feet.  He was admitted to the  hospital for acute exacerbation of chronic systolic CHF, community-acquired pneumonia, infected plantar ulcers on bilateral feet and right foot osteomyelitis.  Dr. Annamary Rummage, podiatrist, was consulted for right foot osteomyelitis.  He recommended MRI of the foot for further evaluation.  Patient has an MRI compatible pacemaker.  However, radiology unit at [facility] is not equipped to perform MRI in a patient with MRI compatible pacemaker.  Dr. Annamary Rummage recommended transfer to [facility] where patient can have an MRI of the foot for further evaluation and management.  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 Continue metoprolol and fineronone Patient previously on Entresto but did not tolerate it (per review of heart failure clinic note earlier in the day) Daily weights with intake and output monitoring Last echo from October with EF 30 to 35% so will not repeat Cardiology was consulted to assist with management   CAP (community acquired pneumonia) He was treated with IV ceftriaxone and azithromycin RSV, influenza and SARS-CoV-2 test were negative. Procalcitonin was normal.    Infected plantar ulcers both feet (HCC) Osteomyelitis right foot History of amputation left great toe He was treated with IV vancomycin and ceftriaxone. Dr. Annamary Rummage, podiatrist, recommended MRI of bilateral feet for further evaluation.  Patient will have to be transferred to [facility] for MRI because he has a pacemaker in place   Persistent atrial fibrillation (HCC) Continue metoprolol Patient intolerant of Eliquis (per review of heart failure clinic note from  earlier in the day) Patient has been previously referred for ablation Continue aspirin   Complete heart  block (HCC)  s/p PACEMAKER No acute issues suspected   Uncontrolled type 2 diabetes mellitus with hyperglycemia, without long-term current use of insulin (HCC) NovoLog as needed per sliding scale was used for hyperglycemia. He is on metformin and glipizide at home but this was held in the inpatient setting   HTN (hypertension) BP controlled Continue metoprolol   Chronic kidney disease, stage 3a (HCC) Renal function at baseline   Anemia due to chronic kidney disease Hemoglobin at baseline   Patient is agreeable to transfer to [facility] for further management.      Consultants: Podiatrist, cardiologist, vascular surgeon Procedures performed: None Disposition:  [facility] Diet recommendation:  Cardiac and Carb modified diet DISCHARGE MEDICATION: Allergies as of 09/12/2023       Reactions   Eliquis [apixaban]    Dizziness  and vision change   Spironolactone Other (See Comments)   dizziness        Medication List     STOP taking these medications    OVER THE COUNTER MEDICATION   sacubitril-valsartan 24-26 MG Commonly known as: ENTRESTO       TAKE these medications    acetaminophen 500 MG tablet Commonly known as: TYLENOL Take 2 tablets (1,000 mg total) by mouth 3 (three) times daily as needed (for pain).   aspirin EC 81 MG tablet Take 1 tablet (81 mg total) by mouth daily. Swallow whole.   bumetanide 2 MG tablet Commonly known as: Bumex Take 1 tablet (2 mg total) by mouth daily.   gentamicin cream 0.1 % Commonly known as: GARAMYCIN Apply 1 Application topically 2 (two) times daily.   glipiZIDE 10 MG tablet Commonly known as: GLUCOTROL Take 1 tablet (10 mg total) by mouth 2 (two) times daily.   Kerendia 10 MG Tabs Generic drug: Finerenone Take 1 tablet (10 mg total) by mouth daily.   metFORMIN 1000 MG tablet Commonly known as: GLUCOPHAGE Take 1 tablet (1,000 mg total) by mouth 2 (two) times daily.   metolazone 2.5 MG  tablet Commonly known as: ZAROXOLYN Take 1 tablet (2.5 mg total) by mouth 2 (two) times a week. On Mondays & Fridays   metoprolol succinate 50 MG 24 hr tablet Commonly known as: TOPROL-XL Take 1 tablet (50 mg total) by mouth daily. Take with or immediately following a meal.   multivitamin with minerals tablet Take 1 tablet by mouth daily.   silver sulfADIAZINE 1 % cream Commonly known as: SILVADENE Apply to affected area daily        Discharge Exam: Filed Weights   09/11/23 1625  Weight: 117.9 kg   GEN: NAD SKIN: Chronic erythematous changes of bilateral legs with scaling suggestive of chronic venous insufficiency EYES: Warm and dry ENT: MMM CV: RRR PULM: Mild bilateral expiratory wheezing ABD: soft, obese, NT, +BS CNS: AAO x 3, non focal EXT: Bilateral leg edema.  Ulcer on the plantar aspect of the right big toe, also on the plantar aspect of the mid left foot   Condition at discharge: stable  The results of significant diagnostics from this hospitalization (including imaging, microbiology, ancillary and laboratory) are listed below for reference.   Imaging Studies: DG Foot 2 Views Right Result Date: 09/11/2023 CLINICAL DATA:  Diabetic foot ulcer EXAM: RIGHT FOOT - 2 VIEW COMPARISON:  None Available. FINDINGS: Transmetatarsal amputation of the right third toe and resection of the right fourth metatarsal  head. Large ulcer is seen within the medial and plantar right great toe which extends to the cortex of the base of the distal phalanx with osseous erosion in this location in keeping with changes of osteomyelitis. There is extensive superimposed soft tissue swelling right great toe and second toe as well as moderate soft tissue swelling of the right forefoot diffusely. Vascular calcifications are noted. Advanced degenerative arthritis noted of the first metatarsophalangeal joint. No acute fracture or dislocation. IMPRESSION: 1. Large ulcer within the medial and plantar right  great toe which extends to the cortex of the base of the distal phalanx with osseous erosion in this location in keeping with changes of osteomyelitis. 2. Transmetatarsal amputation of the right third toe and resection of the right fourth metatarsal head. Electronically Signed   By: Helyn Numbers M.D.   On: 09/11/2023 22:48   DG Foot 2 Views Left Result Date: 09/11/2023 CLINICAL DATA:  Diabetic ulcer EXAM: LEFT FOOT - 2 VIEW COMPARISON:  None Available. FINDINGS: No acute fracture or dislocation. 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 There is a soft tissue wound within the soft tissues distal to the residual first metatarsal. No definite superimposed osseous erosion. There is diffuse soft tissue swelling of the second and third digits. Posttraumatic changes noted involving the second metatarsal head. Moderate midfoot and tibiotalar degenerative arthritis. Vascular calcifications are noted. IMPRESSION: 1. 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 Soft tissue wound within the soft tissues distal to the residual first metatarsal. No definite superimposed osseous erosion. 2. Diffuse soft tissue swelling of the second and third digits. Electronically Signed   By: Helyn Numbers M.D.   On: 09/11/2023 22:46   DG Chest 2 View Result Date: 09/11/2023 CLINICAL DATA:  Shortness of breath EXAM: CHEST - 2 VIEW COMPARISON:  08/13/2023 FINDINGS: Small bilateral pleural effusions. No frank interstitial edema. Mild vague right upper lobe opacity, suspicious for pneumonia. No pleural effusion or pneumothorax. Cardiomegaly.  Left subclavian pacemaker. Degenerative changes of the visualized thoracolumbar spine. IMPRESSION: Mild vague right upper lobe opacity, suspicious for pneumonia. Small bilateral pleural effusions. No frank interstitial edema. Electronically Signed   By: Charline Bills M.D.   On: 09/11/2023 18:09   DG Chest 2 View Result Date: 08/13/2023 CLINICAL DATA:  Shortness of  breath for several weeks, initial encounter EXAM: CHEST - 2 VIEW COMPARISON:  08/03/2023 FINDINGS: Cardiac shadow is enlarged but stable. Pacing device is again seen. Mild central vascular congestion is noted without significant edema. Chronic blunting of the costophrenic angles is seen. No focal infiltrate is noted. No bony abnormality is seen. IMPRESSION: Stable vascular congestion when compare with the prior exam. No focal infiltrate is noted. Electronically Signed   By: Alcide Clever M.D.   On: 08/13/2023 20:32    Microbiology: Results for orders placed or performed during the hospital encounter of 09/11/23  Resp panel by RT-PCR (RSV, Flu A&B, Covid) Anterior Nasal Swab     Status: None   Collection Time: 09/12/23  3:13 AM   Specimen: Anterior Nasal Swab  Result Value Ref Range Status   SARS Coronavirus 2 by RT PCR NEGATIVE NEGATIVE Final    Comment: (NOTE) SARS-CoV-2 target nucleic acids are NOT DETECTED.  The SARS-CoV-2 RNA is generally detectable in upper respiratory specimens during the acute phase of infection. The lowest concentration of SARS-CoV-2 viral copies this assay can detect is 138 copies/mL. A negative result does not preclude SARS-Cov-2 infection and should not be used as the  sole basis for treatment or other patient management decisions. A negative result may occur with  improper specimen collection/handling, submission of specimen other than nasopharyngeal swab, presence of viral mutation(s) within the areas targeted by this assay, and inadequate number of viral copies(<138 copies/mL). A negative result must be combined with clinical observations, patient history, and epidemiological information. The expected result is Negative.  Fact Sheet for Patients:  BloggerCourse.com  Fact Sheet for Healthcare Providers:  SeriousBroker.it  This test is no t yet approved or cleared by the [place] FDA and  has been  authorized for detection and/or diagnosis of SARS-CoV-2 by FDA under an Emergency Use Authorization (EUA). This EUA will remain  in effect (meaning this test can be used) for the duration of the COVID-19 declaration under Section 564(b)(1) of the Act, 21 U.S.C.section 360bbb-3(b)(1), unless the authorization is terminated  or revoked sooner.       Influenza A by PCR NEGATIVE NEGATIVE Final   Influenza B by PCR NEGATIVE NEGATIVE Final    Comment: (NOTE) The Xpert Xpress SARS-CoV-2/FLU/RSV plus assay is intended as an aid in the diagnosis of influenza from Nasopharyngeal swab specimens and should not be used as a sole basis for treatment. Nasal washings and aspirates are unacceptable for Xpert Xpress SARS-CoV-2/FLU/RSV testing.  Fact Sheet for Patients: BloggerCourse.com  Fact Sheet for Healthcare Providers: SeriousBroker.it  This test is not yet approved or cleared by the [place] FDA and has been authorized for detection and/or diagnosis of SARS-CoV-2 by FDA under an Emergency Use Authorization (EUA). This EUA will remain in effect (meaning this test can be used) for the duration of the COVID-19 declaration under Section 564(b)(1) of the Act, 21 U.S.C. section 360bbb-3(b)(1), unless the authorization is terminated or revoked.     Resp Syncytial Virus by PCR NEGATIVE NEGATIVE Final    Comment: (NOTE) Fact Sheet for Patients: BloggerCourse.com  Fact Sheet for Healthcare Providers: SeriousBroker.it  This test is not yet approved or cleared by the [place] FDA and has been authorized for detection and/or diagnosis of SARS-CoV-2 by FDA under an Emergency Use Authorization (EUA). This EUA will remain in effect (meaning this test can be used) for the duration of the COVID-19 declaration under Section 564(b)(1) of the Act, 21 U.S.C. section 360bbb-3(b)(1), unless the  authorization is terminated or revoked.  Performed at [facility], [address] [place], [place] [address]     Labs: CBC: Recent Labs  Lab 09/11/23 1627 09/12/23 0340  WBC 11.3* 9.9  NEUTROABS 9.4*  --   HGB 10.4* 9.4*  HCT 33.8* 30.9*  MCV 89.4 89.3  PLT 298 234   Basic Metabolic Panel: Recent Labs  Lab 09/06/23 1130 09/11/23 1105 09/11/23 1627 09/12/23 0340  NA 136 136 136 135  K 4.1 5.0 5.0 4.2  CL 106 105 106 103  CO2 21* 21* 21* 21*  GLUCOSE 271* 241* 209* 237*  BUN 50* 56* 54* 51*  CREATININE 1.54* 1.65* 1.55* 1.45*  CALCIUM 8.5* 8.8* 8.9 8.6*   Liver Function Tests: No results for input(s): "AST", "ALT", "ALKPHOS", "BILITOT", "PROT", "ALBUMIN" in the last 168 hours. CBG: Recent Labs  Lab 09/12/23 0035 09/12/23 0737  GLUCAP 179* 184*    Discharge time spent: greater than 30 minutes.  Signed: Lurene Shadow, MD [facility] 09/12/2023

## 2023-09-12 NOTE — Progress Notes (Signed)
Pharmacy Antibiotic Note  Tyler [name] is a 77 y.o. male admitted on 09/11/2023 with  wound infection .  Pharmacy has been consulted for Vancomycin dosing.  Plan: Vancomycin 2 gm IV X 1 given in ED on 12/19 @ 0344.  Additional Vanc 500 mg IV X 1 ordered to make total loading dose of 2500 mg.  Vancomycin 2 gm IV Q24H ordered to start on 12/20 @ 0400.  AUC = 483.5 Vanc trough = 11.7   Height: 6\' 6"  (198.1 cm) Weight: 117.9 kg (260 lb) IBW/kg (Calculated) : 91.4  Temp (24hrs), Avg:98.7 F (37.1 C), Min:98.4 F (36.9 C), Max:99 F (37.2 C)  Recent Labs  Lab 09/06/23 1130 09/11/23 1105 09/11/23 1627 09/12/23 0340  WBC  --   --  11.3* 9.9  CREATININE 1.54* 1.65* 1.55* 1.45*    Estimated Creatinine Clearance: 59.6 mL/min (A) (by C-G formula based on SCr of 1.45 mg/dL (H)).    Allergies  Allergen Reactions   Eliquis [Apixaban]     Dizziness  and vision change   Spironolactone Other (See Comments)    dizziness    Antimicrobials this admission:   >>    >>   Dose adjustments this admission:   Microbiology results:  BCx:   UCx:    Sputum:    MRSA PCR:   Thank you for allowing pharmacy to be a part of this patient's care.  Tyler [name] D 09/12/2023 4:15 AM

## 2023-09-12 NOTE — ED Notes (Signed)
RN offered to switch pt leg bag to a standard drainage bag with consideration of the Lasix infusion that pt received. Pt declined and states he would rather keep the leg bag that he has in place. Patient is resting in bed free from sign of distress. Breathing unlabored speaking in full sentences with symmetric chest rise and fall. Bed low and locked with side rails raised x2. Call bell in reach and monitor in place.

## 2023-09-13 ENCOUNTER — Telehealth: Payer: Self-pay | Admitting: Podiatry

## 2023-09-13 ENCOUNTER — Ambulatory Visit: Payer: BC Managed Care – PPO | Admitting: Physician Assistant

## 2023-09-13 ENCOUNTER — Encounter
Admission: EM | Disposition: A | Discharge status: Home / Self Care | Payer: Self-pay | Source: Home / Self Care | Attending: Student

## 2023-09-13 DIAGNOSIS — M86671 Other chronic osteomyelitis, right ankle and foot: Secondary | ICD-10-CM

## 2023-09-13 DIAGNOSIS — L97519 Non-pressure chronic ulcer of other part of right foot with unspecified severity: Secondary | ICD-10-CM

## 2023-09-13 DIAGNOSIS — M869 Osteomyelitis, unspecified: Secondary | ICD-10-CM | POA: Diagnosis not present

## 2023-09-13 DIAGNOSIS — L97529 Non-pressure chronic ulcer of other part of left foot with unspecified severity: Secondary | ICD-10-CM

## 2023-09-13 DIAGNOSIS — T82856A Stenosis of peripheral vascular stent, initial encounter: Secondary | ICD-10-CM

## 2023-09-13 DIAGNOSIS — I70235 Atherosclerosis of native arteries of right leg with ulceration of other part of foot: Secondary | ICD-10-CM | POA: Diagnosis not present

## 2023-09-13 DIAGNOSIS — I70245 Atherosclerosis of native arteries of left leg with ulceration of other part of foot: Secondary | ICD-10-CM

## 2023-09-13 DIAGNOSIS — L03115 Cellulitis of right lower limb: Secondary | ICD-10-CM | POA: Diagnosis not present

## 2023-09-13 HISTORY — PX: LOWER EXTREMITY ANGIOGRAPHY: CATH118251

## 2023-09-13 LAB — BASIC METABOLIC PANEL
Anion gap: 13 (ref 5–15)
BUN: 58 mg/dL — ABNORMAL HIGH (ref 8–23)
CO2: 20 mmol/L — ABNORMAL LOW (ref 22–32)
Calcium: 8.5 mg/dL — ABNORMAL LOW (ref 8.9–10.3)
Chloride: 103 mmol/L (ref 98–111)
Creatinine, Ser: 1.38 mg/dL — ABNORMAL HIGH (ref 0.61–1.24)
GFR, Estimated: 52 mL/min — ABNORMAL LOW (ref >60)
Glucose, Bld: 168 mg/dL — ABNORMAL HIGH (ref 70–99)
Potassium: 3.8 mmol/L (ref 3.5–5.1)
Sodium: 136 mmol/L (ref 135–145)

## 2023-09-13 LAB — CBG MONITORING, ED
Glucose-Capillary: 154 mg/dL — ABNORMAL HIGH (ref 70–99)
Glucose-Capillary: 130 mg/dL — ABNORMAL HIGH (ref 70–99)
Glucose-Capillary: 156 mg/dL — ABNORMAL HIGH (ref 70–99)
Glucose-Capillary: 141 mg/dL — ABNORMAL HIGH (ref 70–99)

## 2023-09-13 LAB — GLUCOSE, CAPILLARY
Glucose-Capillary: 136 mg/dL — ABNORMAL HIGH (ref 70–99)
Glucose-Capillary: 131 mg/dL — ABNORMAL HIGH (ref 70–99)

## 2023-09-13 LAB — MAGNESIUM: Magnesium: 1.9 mg/dL (ref 1.7–2.4)

## 2023-09-13 SURGERY — LOWER EXTREMITY ANGIOGRAPHY
Anesthesia: Moderate Sedation | Laterality: Right

## 2023-09-13 MED ORDER — CEFAZOLIN SODIUM-DEXTROSE 2-4 GM/100ML-% IV SOLN
2.0000 g | Freq: Once | INTRAVENOUS | Status: AC
  Administered 2023-09-13: 2 g via INTRAVENOUS

## 2023-09-13 MED ORDER — SODIUM CHLORIDE 0.9 % IV SOLN: INTRAVENOUS | Status: DC | PRN

## 2023-09-13 MED ORDER — IODIXANOL 320 MG/ML IV SOLN
INTRAVENOUS | Status: DC | PRN
  Administered 2023-09-13: 70 mL

## 2023-09-13 MED ORDER — CEFAZOLIN SODIUM-DEXTROSE 2-4 GM/100ML-% IV SOLN
INTRAVENOUS | Status: AC
Start: 2023-09-13 — End: ?
  Filled 2023-09-13: qty 100

## 2023-09-13 MED ORDER — FENTANYL CITRATE (PF) 100 MCG/2ML IJ SOLN
INTRAMUSCULAR | Status: AC
  Filled 2023-09-13: qty 2

## 2023-09-13 MED ORDER — SODIUM CHLORIDE 0.9 % IV SOLN: 250.0000 mL | INTRAVENOUS | Status: AC | PRN

## 2023-09-13 MED ORDER — MIDAZOLAM HCL 2 MG/2ML IJ SOLN
INTRAMUSCULAR | Status: DC | PRN
  Administered 2023-09-13: 2 mg via INTRAVENOUS

## 2023-09-13 MED ORDER — BUMETANIDE 0.25 MG/ML IJ SOLN
2.0000 mg | Freq: Three times a day (TID) | INTRAMUSCULAR | Status: DC
  Administered 2023-09-13 – 2023-09-21 (×23): 2 mg via INTRAVENOUS
  Filled 2023-09-13 (×25): qty 8

## 2023-09-13 MED ORDER — ATORVASTATIN CALCIUM 10 MG PO TABS
10.0000 mg | ORAL_TABLET | Freq: Every day | ORAL | Status: DC
  Administered 2023-09-13 – 2023-09-20 (×8): 10 mg via ORAL
  Filled 2023-09-13 (×8): qty 1

## 2023-09-13 MED ORDER — FAMOTIDINE 20 MG PO TABS: 40.0000 mg | ORAL_TABLET | Freq: Once | ORAL | Status: DC | PRN

## 2023-09-13 MED ORDER — SODIUM CHLORIDE 0.9% FLUSH: 3.0000 mL | INTRAVENOUS | Status: DC | PRN

## 2023-09-13 MED ORDER — FENTANYL CITRATE (PF) 100 MCG/2ML IJ SOLN
INTRAMUSCULAR | Status: DC | PRN
  Administered 2023-09-13: 50 ug via INTRAVENOUS

## 2023-09-13 MED ORDER — MIDAZOLAM HCL 5 MG/5ML IJ SOLN
INTRAMUSCULAR | Status: AC
  Filled 2023-09-13: qty 5

## 2023-09-13 MED ORDER — DIPHENHYDRAMINE HCL 50 MG/ML IJ SOLN: 50.0000 mg | Freq: Once | INTRAMUSCULAR | Status: DC | PRN

## 2023-09-13 MED ORDER — CLOPIDOGREL BISULFATE 75 MG PO TABS
300.0000 mg | ORAL_TABLET | Freq: Once | ORAL | Status: AC
  Administered 2023-09-13: 300 mg via ORAL

## 2023-09-13 MED ORDER — LIDOCAINE HCL (PF) 1 % IJ SOLN
INTRAMUSCULAR | Status: DC | PRN
  Administered 2023-09-13: 10 mL

## 2023-09-13 MED ORDER — HEPARIN (PORCINE) IN NACL 1000-0.9 UT/500ML-% IV SOLN
INTRAVENOUS | Status: DC | PRN
  Administered 2023-09-13: 1000 mL

## 2023-09-13 MED ORDER — METHYLPREDNISOLONE SODIUM SUCC 125 MG IJ SOLR: 125.0000 mg | Freq: Once | INTRAMUSCULAR | Status: DC | PRN

## 2023-09-13 MED ORDER — SODIUM CHLORIDE 0.9 % IV SOLN: INTRAVENOUS | Status: AC

## 2023-09-13 MED ORDER — CHLORHEXIDINE GLUCONATE CLOTH 2 % EX PADS
6.0000 | MEDICATED_PAD | Freq: Every day | CUTANEOUS | Status: DC
  Administered 2023-09-13 – 2023-09-21 (×7): 6 via TOPICAL

## 2023-09-13 MED ORDER — CLOPIDOGREL BISULFATE 75 MG PO TABS
75.0000 mg | ORAL_TABLET | Freq: Every day | ORAL | Status: DC
  Administered 2023-09-14 – 2023-09-21 (×7): 75 mg via ORAL
  Filled 2023-09-13 (×7): qty 1

## 2023-09-13 MED ORDER — FENTANYL CITRATE (PF) 100 MCG/2ML IJ SOLN: 12.5000 ug | Freq: Once | INTRAMUSCULAR | Status: DC | PRN

## 2023-09-13 MED ORDER — SODIUM CHLORIDE 0.9% FLUSH
3.0000 mL | Freq: Two times a day (BID) | INTRAVENOUS | Status: DC
  Administered 2023-09-13 – 2023-09-21 (×15): 3 mL via INTRAVENOUS

## 2023-09-13 MED ORDER — HEPARIN SODIUM (PORCINE) 1000 UNIT/ML IJ SOLN
INTRAMUSCULAR | Status: DC | PRN
  Administered 2023-09-13: 6000 [IU] via INTRAVENOUS

## 2023-09-13 MED ORDER — HEPARIN SODIUM (PORCINE) 1000 UNIT/ML IJ SOLN
INTRAMUSCULAR | Status: AC
  Filled 2023-09-13: qty 10

## 2023-09-13 MED ORDER — SODIUM CHLORIDE 0.9 % IV SOLN: INTRAVENOUS | Status: AC | PRN

## 2023-09-13 MED ORDER — CLOPIDOGREL BISULFATE 75 MG PO TABS
ORAL_TABLET | ORAL | Status: AC
  Filled 2023-09-13: qty 4

## 2023-09-13 MED ORDER — MIDAZOLAM HCL 2 MG/ML PO SYRP: 8.0000 mg | ORAL_SOLUTION | Freq: Once | ORAL | Status: DC | PRN

## 2023-09-13 SURGICAL SUPPLY — 23 items
BALLN LUTONIX 6X150X130 (BALLOONS) ×1
BALLN LUTONIX 6X220X130 (BALLOONS) ×1
BALLN LUTONIX DCB 7X40X130 (BALLOONS) ×1
BALLOON LUTONIX 6X150X130 (BALLOONS) IMPLANT
BALLOON LUTONIX 6X220X130 (BALLOONS) IMPLANT
BALLOON LUTONIX DCB 7X40X130 (BALLOONS) IMPLANT
CATH ANGIO 5F PIGTAIL 65CM (CATHETERS) IMPLANT
COVER PROBE ULTRASOUND 5X96 (MISCELLANEOUS) IMPLANT
DEVICE PRESTO INFLATION (MISCELLANEOUS) IMPLANT
DEVICE STARCLOSE SE CLOSURE (Vascular Products) IMPLANT
GLIDEWIRE ADV .035X260CM (WIRE) IMPLANT
GOWN STRL REUS W/ TWL LRG LVL3 (GOWN DISPOSABLE) ×1 IMPLANT
LIFESTENT SOLO 7X200X135 (Permanent Stent) IMPLANT
NDL ENTRY 21GA 7CM ECHOTIP (NEEDLE) IMPLANT
NEEDLE ENTRY 21GA 7CM ECHOTIP (NEEDLE) ×1 IMPLANT
PACK ANGIOGRAPHY (CUSTOM PROCEDURE TRAY) ×1 IMPLANT
SET INTRO CAPELLA COAXIAL (SET/KITS/TRAYS/PACK) IMPLANT
SHEATH BRITE TIP 5FRX11 (SHEATH) IMPLANT
SHEATH RAABE 6FR (SHEATH) IMPLANT
SYR MEDRAD MARK 7 150ML (SYRINGE) IMPLANT
TUBING CONTRAST HIGH PRESS 72 (TUBING) IMPLANT
WIRE GUIDERIGHT .035X150 (WIRE) IMPLANT
WIRE SUPRACORE 300CM (WIRE) IMPLANT

## 2023-09-13 NOTE — Interval H&P Note (Signed)
History and Physical Interval Note:  09/13/2023 2:25 PM  Tyler [name]  has presented today for surgery, with the diagnosis of PAD with open wounds and osteomyelitis.  The various methods of treatment have been discussed with the patient and family. After consideration of risks, benefits and other options for treatment, the patient has consented to  Procedure(s): Lower Extremity Angiography (Right) as a surgical intervention.  [name] history has been reviewed, patient examined, no change in status, stable for surgery.  I have reviewed the patient's chart and labs.  Questions were answered to the patient's satisfaction.     Levora Dredge

## 2023-09-13 NOTE — Op Note (Signed)
[name]  Percutaneous Study/Intervention Procedural Note   Date of Surgery: 09/13/2023  Surgeon:  Renford Dills, MD.  Pre-operative Diagnosis: Atherosclerotic occlusive disease bilateral lower extremity with ulceration bilateral feet associated with cellulitis of the right foot  Post-operative diagnosis:  Same  Procedure(s) Performed:             1.  Introduction catheter into right lower extremity 3rd order catheter placement              2.    Contrast injection right lower extremity for distal runoff             3.  Percutaneous transluminal angioplasty and stent placement right superficial femoral artery and popliteal             4.  Star close closure left common femoral arteriotomy  Anesthesia: Conscious sedation was administered under my direct supervision by the interventional radiology RN. IV Versed plus fentanyl were utilized. Continuous ECG, pulse oximetry and blood pressure was monitored throughout the entire procedure.  Conscious sedation was for a total of 43 minutes and 9 seconds.  Sheath: 6 [place] Rabie left common femoral retrograde  Contrast: 70 cc  Fluoroscopy Time: 5.9 minutes  Indications:  Tyler [name] presents to [facility] with severe cellulitis of his right foot associated with open wounds.  He also has open wounds of the left foot.  He has known atherosclerotic occlusive disease and has undergone stenting of the right lower extremity in the past.  Clinical examination demonstrates nonpalpable pedal pulses on the right.  Angiography with hope for intervention and limb salvage is recommended.  The risks and benefits are reviewed all questions answered patient agrees to proceed.  Procedure:  Tyler [name] is a 77 y.o. y.o. male who was identified and appropriate procedural time out was performed.  The patient was then placed supine on the table and prepped and draped in the usual sterile fashion.     Ultrasound was placed in the sterile sleeve and the left groin was evaluated the left common femoral artery was echolucent and pulsatile indicating patency.  Image was recorded for the permanent record and under real-time visualization a microneedle was inserted into the common femoral artery microwire followed by a micro-sheath.  A J-wire was then advanced through the micro-sheath and a  5 [place] sheath was then inserted over a J-wire. J-wire was then advanced and a 5 French pigtail catheter was positioned at the level of T12. AP projection of the aorta was then obtained. Pigtail catheter was repositioned to above the bifurcation and a LAO view of the pelvis was obtained.  Subsequently a pigtail catheter with the stiff angle Glidewire was used to cross the aortic bifurcation the catheter wire were advanced down into the right distal external iliac artery. Oblique view of the femoral bifurcation was then obtained and subsequently the wire was reintroduced and the pigtail catheter negotiated into the SFA representing third order catheter placement. Distal runoff was then performed.  6000 units of heparin was then given and allowed to circulate and a 6 [place] Rabie sheath was advanced up and over the bifurcation and positioned in the femoral artery  KMP  catheter and stiff angle Glidewire were then negotiated down into the distal popliteal.  Distal runoff was then completed by hand injection through the catheter. The wire was then reintroduced and a 6 mm x 150 mm Lutonix drug-eluting balloon was used to angioplasty the superficial  femoral and popliteal arteries. Inflation was to 10 atmospheres for 1 minute. Follow-up imaging demonstrated patency with greater than 50% residual stenosis noted at several locations particularly in the at knee popliteal at the level of the femoral condyles as well as at the distal end of the existing stent.  Therefore a 7 mm x 200 mm life stent was deployed and subsequently  postdilated with a 6 mm x 220 mm Lutonix drug-eluting balloon. Distal runoff was then reassessed.  Again at that spot at the level of the femoral condyles there was 40 to 50% residual stenosis and a 7 mm x 40 mm Lutonix drug-eluting balloon was advanced across this particular location inflated to 12 atm for 1 full minute.  Follow-up imaging demonstrated less than 20% residual stenosis the stent now appears to be smooth in contour and the eccentric plaque appears to been well treated.  After review of these images the sheath is pulled into the left external iliac oblique of the common femoral is obtained and a Star close device deployed. There no immediate complications.   Findings:  The abdominal aorta is opacified with a bolus injection contrast. Renal arteries are single but there is greater than 80% stenosis at the origins of the bilateral renal arteries. The aorta itself has diffuse disease but no hemodynamically significant lesions. The common and external iliac arteries are widely patent bilaterally.  The right common femoral is widely patent as is the profunda femoris.  The SFA has a pre-existing stent which is patent.  There is moderate 40 to 50% stenosis at the leading edge of the existing stent proximally.  Distally there is a greater than 70% stenosis at the distal end of the stent and then there are diffuse lesions some of which are 80% particularly the 1 at the level of the femoral condyles in the mid popliteal.  The distal popliteal demonstrates disease but no hemodynamically significant stenosis and the trifurcation is heavily diseased with occlusion of the tibioperoneal trunk, peroneal and posterior tibial which demonstrates occlusion throughout its course.  The anterior tibial is patent from its origin down to the foot which fills the dorsalis pedis and the pedal arch.  Following angioplasty the SFA and proximal popliteal demonstrate significant residual stenosis as described above.   Therefore a life stent is placed and postdilated with a 6 mm Lutonix balloon.  There is 1 focal area in the mid popliteal as described above which had significant residual stenosis in this area was treated with a 7 mm balloon inflation.  Following this there is wide patency of the entire SFA and popliteal with less than 10% residual stenosis throughout.  The anterior tibial is the single-vessel runoff is preserved all the way down to the foot..    Summary: Successful recanalization right lower extremity for limb salvage                        Disposition: Patient was taken to the recovery room in stable condition having tolerated the procedure well.  Tyler [name], Tyler [name] 09/13/2023,3:24 PM

## 2023-09-13 NOTE — ED Notes (Signed)
Hospitalist at bedside 

## 2023-09-13 NOTE — Telephone Encounter (Signed)
Called pt back he is in the hospital he wanted to to touch base with Dr Logan Bores and let him know he felt like the wound center hasn't really done anything for him. He is disappointed. He did get his supplies after 5 days. I let pt know if he needs supplies or anything to reach out to [place] and we can give what he needs till he gets his order. Patient very appreciative of [place].

## 2023-09-13 NOTE — Progress Notes (Signed)
[facility] CLINIC CARDIOLOGY PROGRESS NOTE       Patient ID: Tyler [name] MRN: [id number] DOB/AGE: 30-Aug-1946 77 y.o.  Admit date: 09/11/2023 Referring Physician Dr. Lindajo Royal Primary Physician Elder Negus, NP  Primary Cardiologist Dr. Corky Sing (has been scheduled but no showed appointment) Reason for Consultation AoCHF  HPI: Tyler [name] is a 77 y.o. male  with a past medical history of  chronic HFrEF, persistent atrial fibrillation, mitral insufficiency, CHB s/p PPM 2021, hypertension, chronic kidney disease stage III  who presented to the ED on 09/11/2023 for shortness of breath. Cardiology was consulted for further evaluation.   Interval history: -Patient seen and examined this AM, feeling slightly better.  -Resting comfortably nearly flat. States SOB mildly improved.  -Reports only mild increase in UOP with IV bumex yesterday.  -Renal function stable.   Review of systems complete and found to be negative unless listed above    Past Medical History:  Diagnosis Date   (HFpEF) heart failure with preserved ejection fraction (HCC) 03/01/2020   a.) TTE 03/01/2020: EF 55-60%, mod MAC, mod AoV sclerosis, triv AR, mild TR, mod MR, RVSP 50-59; b.) TTE 09/10/2020: EF 55-60%, mod MAC, mod AoV sclerosis, mild TR, 3+ MR, RVSP 50-59; c.) TTE 09/26/2020: EF 55-60%, mild LA dil, triv PR, mild MR/TR, RVSP 37-49   Adenoma of left adrenal gland    Anemia    Arthritis    Atrial fibrillation and flutter (HCC)    a.) CHA2DS2VASc = 5 (age x2, HFpEF, HTN, T2DM);  b.) s/p CTI ablation 09/07/2020; c.) rate/rhythm maintained on oral carvedilol; not on chronic anticoagulation therapy   CAD (coronary artery disease)    Cardiomegaly    CKD (chronic kidney disease), stage III (HCC)    DOE (dyspnea on exertion)    Drug-induced bradycardia    Gangrene of toe of left foot (HCC)    a.) 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     HLD (hyperlipidemia)    Hypertension    Long term current use of aspirin    Lymphedema of both lower extremities    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)    a.) s/p amputation 11/04/2022   Peripheral vascular disease (HCC)    Pleural effusion on right 09/09/2020   a.) s/p RIGHT thoracentesis with 2180 cc yield   Pneumonia    Presence of permanent cardiac pacemaker 09/10/2020   a.) TVP placement 09/10/2020 due to intermittent CHB in setting of urosepsis; b.) s/p PPM placement 09/15/2020: MDT Azure XT DR (SN: [id number])   Pulmonary hypertension (HCC) 03/01/2020   a.) TTE: 03/01/2020: RVSP 50-59; b.) TTE 09/10/2020: RVSP 50-59; c.) TTE 09/26/2020: RVSP 37-49   RA (rheumatoid arthritis) (HCC)    Rheumatic fever    Sepsis (HCC) 09/10/2020   a.) urosepsis --> BC x 2 sets and UC all grew out significant Proteus mirabilis; admitted to [facility] 09/07/2020 - 09/27/2020.   Sick sinus syndrome [facility])    a.) s/p MDT PPM placement 09/15/2020   T2DM (type 2 diabetes mellitus) (HCC)    Urinary retention    chronic, with indwelling Foley catheter and plans for a suprapubic   Wears dentures    full upper    Past Surgical History:  Procedure Laterality Date   AMPUTATION TOE Right 11/04/2022   Procedure: AMPUTATION TOE;  Surgeon: Felecia Shelling, [name];  [name];  Service: Podiatry;  Laterality: Right;  BONE BIOPSY Right 04/05/2023   Procedure: BONE BIOPSY THIRD & FOURTH;  Surgeon: Felecia Shelling, [name];  [name];  Service: Podiatry;  Laterality: Right;   CARDIAC ELECTROPHYSIOLOGY STUDY AND ABLATION N/A 09/07/2020   Procedure: CARDIAC EP STUDY AND ABLATION (CTI)   CATARACT EXTRACTION W/PHACO Right 01/16/2017   Procedure: CATARACT EXTRACTION PHACO AND INTRAOCULAR LENS PLACEMENT (IOC)  Right Complicated;  Surgeon: Lockie Mola, MD;  Location: [facility];  Service: Ophthalmology;  Laterality: Right;  IVA Block Healon 5 malyugin vision blue Diabetic  - oral meds   CATARACT EXTRACTION W/PHACO Left 10/23/2022   Procedure: CATARACT EXTRACTION PHACO AND INTRAOCULAR LENS PLACEMENT (IOC) LEFT DIABETIC;  Surgeon: Galen Manila, MD;  Location: [facility];  Service: Ophthalmology;  Laterality: Left;  Diabetic   COLONOSCOPY     LOWER EXTREMITY ANGIOGRAPHY Right 11/02/2022   Procedure: Lower Extremity Angiography;  Surgeon: Annice Needy, MD;  Location: [facility] INVASIVE CV LAB;  Service: Cardiovascular;  Laterality: Right;   METATARSAL HEAD EXCISION Left 07/05/2018   Procedure: RESECTION FIRST METATARSAL INFECTED BONE AND SOFT TISSUE;  Surgeon: Recardo Evangelist, [name];  [name];  Service: Podiatry;  Laterality: Left;   METATARSAL HEAD EXCISION Right 04/05/2023   Procedure: METATARSAL HEAD EXCISION THIRD & FOURTH;  Surgeon: Felecia Shelling, [name];  [name];  Service: Podiatry;  Laterality: Right;   PACEMAKER INSERTION  09/15/2020   TOE AMPUTATION Left    TONSILLECTOMY      (Not in a hospital admission)  Social History   Socioeconomic History   Marital status: Single    Spouse name: Not on file   Number of children: Not on file   Years of education: Not on file   Highest education level: Not on file  Occupational History    Comment: [employer]  Tobacco Use   Smoking status: Never   Smokeless tobacco: Never  Vaping Use   Vaping status: Never Used  Substance and Sexual Activity   Alcohol use: Not Currently    Comment: rarely   Drug use: Never   Sexual activity: Not Currently    Birth control/protection: None  Other Topics Concern   Not on file  Social History Narrative   Lives with roommate   Social Drivers of Health   Financial Resource Strain: Low Risk  (03/20/2023)   Received from [facility]   Overall Financial Resource Strain (CARDIA)    Difficulty of Paying Living Expenses: Not hard at all  Food Insecurity: No Food Insecurity (08/14/2023)   Hunger Vital Sign    Worried  About Running Out of Food in the Last Year: Never true    Ran Out of Food in the Last Year: Never true  Transportation Needs: No Transportation Needs (08/14/2023)   PRAPARE - Administrator, Civil Service (Medical): No    Lack of Transportation (Non-Medical): No  Physical Activity: Not on file  Stress: No Stress Concern Present (09/07/2020)   Received from [facility], [facility] Occupational Health - Occupational Stress Questionnaire    Feeling of Stress : Not at all  Social Connections: Unknown (02/01/2022)   Received from [facility]   Social Network    Social Network: Not on file  Intimate Partner Violence: Not At Risk (08/14/2023)   Humiliation, Afraid, Rape, and Kick questionnaire    Fear of Current or Ex-Partner: No    Emotionally Abused: No    Physically  Abused: No    Sexually Abused: No    Family History  Problem Relation Age of Onset   Cancer Niece      Vitals:   09/13/23 0440 09/13/23 0700 09/13/23 0758 09/13/23 0919  BP: 134/72 123/81    Pulse: 91 85    Resp: (!) 31 (!) 22    Temp: 98.1 F (36.7 C)   97.8 F (36.6 C)  TempSrc: Oral   Oral  SpO2: 97% 97% 100%   Weight:      Height:        PHYSICAL EXAM General: Chronically ill appearing male, well nourished, in no acute distress. HEENT: Normocephalic and atraumatic. Neck: No JVD.  Lungs: Normal respiratory effort on room air. Clear bilaterally to auscultation. No wheezes, crackles, rhonchi.  Heart: HRRR. Normal S1 and S2 without gallops or murmurs.  Abdomen: Non-distended appearing.  Msk: Normal strength and tone for age. Extremities: Warm and well perfused. No clubbing, cyanosis. Chronic appearing bilateral LE edema, bilateral foot ulcers.  Neuro: Alert and oriented X 3. Psych: Answers questions appropriately.   Labs: Basic Metabolic Panel: Recent Labs    09/12/23 0340 09/13/23 0428  NA 135 136  K 4.2 3.8  CL 103 103  CO2 21* 20*  GLUCOSE  237* 168*  BUN 51* 58*  CREATININE 1.45* 1.38*  CALCIUM 8.6* 8.5*  MG  --  1.9   Liver Function Tests: No results for input(s): "AST", "ALT", "ALKPHOS", "BILITOT", "PROT", "ALBUMIN" in the last 72 hours. No results for input(s): "LIPASE", "AMYLASE" in the last 72 hours. CBC: Recent Labs    09/11/23 1627 09/12/23 0340  WBC 11.3* 9.9  NEUTROABS 9.4*  --   HGB 10.4* 9.4*  HCT 33.8* 30.9*  MCV 89.4 89.3  PLT 298 234   Cardiac Enzymes: Recent Labs    09/11/23 1627 09/11/23 1943  TROPONINIHS 18* 22*   BNP: Recent Labs    09/11/23 1105 09/11/23 1627  BNP 3,491.5* 8,295.6*   D-Dimer: No results for input(s): "DDIMER" in the last 72 hours. Hemoglobin A1C: No results for input(s): "HGBA1C" in the last 72 hours. Fasting Lipid Panel: No results for input(s): "CHOL", "HDL", "LDLCALC", "TRIG", "CHOLHDL", "LDLDIRECT" in the last 72 hours. Thyroid Function Tests: No results for input(s): "TSH", "T4TOTAL", "T3FREE", "THYROIDAB" in the last 72 hours.  Invalid input(s): "FREET3" Anemia Panel: No results for input(s): "VITAMINB12", "FOLATE", "FERRITIN", "TIBC", "IRON", "RETICCTPCT" in the last 72 hours.   Radiology: MR FOOT LEFT WO CONTRAST Result Date: 09/12/2023 CLINICAL DATA:  Plantar foot ulcer. EXAM: MRI OF THE LEFT FOOT WITHOUT CONTRAST TECHNIQUE: Multiplanar, multisequence MR imaging of the left forefoot was performed. No intravenous contrast was administered. COMPARISON:  Left foot x-rays from yesterday. CT left foot dated July 06, 2023. FINDINGS: Bones/Joint/Cartilage No suspicious marrow signal abnormality. Subtle periarticular marrow edema along the plantar aspect of the first TMT joint is also seen at the dorsal aspect of the joint and is most likely degenerative. 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 No fracture or dislocation. Chronic flattening of the second and third metatarsal heads with associated second and third MTP joint  osteoarthritis. Mild midfoot degenerative changes. No joint effusion. Ligaments Collateral ligaments are intact. Muscles and Tendons No tenosynovitis. Complete fatty atrophy of the intrinsic foot muscles. Soft tissue Ulceration along the medial plantar aspect of the midfoot near the base of the medial cuneiform. Diffuse soft tissue swelling. No fluid collection. No soft tissue mass. IMPRESSION: 1. Skin ulceration along the medial plantar  aspect of the midfoot near the base of the medial cuneiform. No evidence of osteomyelitis or abscess. Electronically Signed   By: [name] M.D.   On: 09/12/2023 18:01   MR FOOT RIGHT WO CONTRAST Result Date: 09/12/2023 CLINICAL DATA:  Right foot ulcer. EXAM: MRI OF THE RIGHT FOREFOOT WITHOUT CONTRAST TECHNIQUE: Multiplanar, multisequence MR imaging of the right forefoot was performed. No intravenous contrast was administered. COMPARISON:  Right foot x-rays from yesterday. CT right foot dated July 06, 2023. FINDINGS: Bones/Joint/Cartilage Abnormal marrow edema involving the first proximal phalanx head and majority of the first distal phalanx. First IP joint effusion. Prior third ray distal fourth metatarsal amputations. No fracture or dislocation. Unchanged severe first MTP joint osteoarthritis. No joint effusion. Ligaments Collateral ligaments are intact. Muscles and Tendons No tenosynovitis. Near complete fatty atrophy of the intrinsic muscles. Soft tissue Deep ulceration along the medial plantar aspect of the great toe with sinus tract extending to the IP joint. Diffuse soft tissue swelling without focal fluid collection. Small ganglion cyst along the plantar aspect of the second metatarsal head. IMPRESSION: 1. Deep ulceration along the medial plantar aspect of the great toe with sinus tract extending to the IP joint. Associated septic arthritis and osteomyelitis of the first proximal and distal phalanges. No abscess. Electronically Signed   By: [name] M.D.    On: 09/12/2023 17:56   DG Foot 2 Views Right Result Date: 09/11/2023 CLINICAL DATA:  Diabetic foot ulcer EXAM: RIGHT FOOT - 2 VIEW COMPARISON:  None Available. FINDINGS: Transmetatarsal amputation of the right third toe and resection of the right fourth metatarsal head. Large ulcer is seen within the medial and plantar right great toe which extends to the cortex of the base of the distal phalanx with osseous erosion in this location in keeping with changes of osteomyelitis. There is extensive superimposed soft tissue swelling right great toe and second toe as well as moderate soft tissue swelling of the right forefoot diffusely. Vascular calcifications are noted. Advanced degenerative arthritis noted of the first metatarsophalangeal joint. No acute fracture or dislocation. IMPRESSION: 1. Large ulcer within the medial and plantar right great toe which extends to the cortex of the base of the distal phalanx with osseous erosion in this location in keeping with changes of osteomyelitis. 2. Transmetatarsal amputation of the right third toe and resection of the right fourth metatarsal head. Electronically Signed   By: Helyn Numbers M.D.   On: 09/11/2023 22:48   DG Foot 2 Views Left Result Date: 09/11/2023 CLINICAL DATA:  Diabetic ulcer EXAM: LEFT FOOT - 2 VIEW COMPARISON:  None Available. FINDINGS: No acute fracture or dislocation. 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 There is a soft tissue wound within the soft tissues distal to the residual first metatarsal. No definite superimposed osseous erosion. There is diffuse soft tissue swelling of the second and third digits. Posttraumatic changes noted involving the second metatarsal head. Moderate midfoot and tibiotalar degenerative arthritis. Vascular calcifications are noted. IMPRESSION: 1. 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 Soft tissue wound within the soft tissues distal to the residual first metatarsal. No definite  superimposed osseous erosion. 2. Diffuse soft tissue swelling of the second and third digits. Electronically Signed   By: Helyn Numbers M.D.   On: 09/11/2023 22:46   DG Chest 2 View Result Date: 09/11/2023 CLINICAL DATA:  Shortness of breath EXAM: CHEST - 2 VIEW COMPARISON:  08/13/2023 FINDINGS: Small bilateral pleural effusions. No frank interstitial edema. Mild  vague right upper lobe opacity, suspicious for pneumonia. No pleural effusion or pneumothorax. Cardiomegaly.  Left subclavian pacemaker. Degenerative changes of the visualized thoracolumbar spine. IMPRESSION: Mild vague right upper lobe opacity, suspicious for pneumonia. Small bilateral pleural effusions. No frank interstitial edema. Electronically Signed   By: Charline Bills M.D.   On: 09/11/2023 18:09    ECHO 06/2023: 1. Left ventricular ejection fraction, by estimation, is 30 to 35%. The left ventricle has moderately decreased function. The left ventricle demonstrates regional wall motion abnormalities (see scoring diagram/findings for description). Indeterminate diastolic filling due to E-A fusion.   2. Right ventricular systolic function is normal. The right ventricular  size is normal.   3. The mitral valve is normal in structure. Moderate mitral valve  regurgitation. No evidence of mitral stenosis.   4. The aortic valve is normal in structure. Aortic valve regurgitation is  not visualized. No aortic stenosis is present.   5. The inferior vena cava is normal in size with greater than 50%  respiratory variability, suggesting right atrial pressure of 3 mmHg.   TELEMETRY reviewed by me 09/13/2023: NSR rate 80s  EKG reviewed by me: VP with PVCs rate 95 bpm  Data reviewed by me 09/13/2023: last 24h vitals tele labs imaging I/O hospitalist progress note  Active Problems:   Persistent atrial fibrillation (HCC)   HTN (hypertension)   Chronic kidney disease, stage 3a (HCC)   Acute on chronic heart failure with reduced ejection  fraction (HFrEF,30-35 %) (HCC)   Moderate mitral regurgitation by prior echocardiogram   Nonischemic cardiomyopathy (HCC)   Status cardiac pacemaker   Anemia due to chronic kidney disease   Uncontrolled type 2 diabetes mellitus with hyperglycemia, without long-term current use of insulin (HCC)   Chronic bilateral pleural effusions   Complete heart block (HCC)  s/p PACEMAKER   Infected plantar ulcers both feet (HCC)   Osteomyelitis of right foot (HCC)   CAP (community acquired pneumonia)    ASSESSMENT AND PLAN:  Tyler [name] is a 77 y.o. male  with a past medical history of  chronic HFrEF, persistent atrial fibrillation, mitral insufficiency, CHB s/p PPM 2021, hypertension, chronic kidney disease stage III  who presented to the ED on 09/11/2023 for shortness of breath. Cardiology was consulted for further evaluation.   # Acute on chronic heart failure reduced EF # Acute hypoxic respiratory failure # R great toe osteomyelitis Patient presented with worsening SOB, LE edema. Also foot wounds with drainage. Seen at HF clinic yesterday, BNP 3500, sent to ED. BNP in ED 3800. Troponins 18 > 22. Did not require supplemental O2.  -Increase IV bumex 2 mg to TID.  -Continue home metoprolol succinate 50 mg daily and finerenone 10 mg daily. Consider addition of ARB after he has been diuresed some. -Mild and flat troponin elevation most consistent with demand/supply mismatch and not ACS in the setting of acute heart failure. -No plan for further cardiac diagnostics at this time.   # CHB s/p Medtronic dual chamber PPM 08/2020 # Hx atrial fibrillation Patient with history of persistent atrial fibrillation previously on eliquis, this was discontinued due to hematuria in 2023. Underwent PPM implant in 2021 for CHB, has had regular device checks since implant.  -Beta-blocker as above for rate control -Recommend anticoagulation however patient has historically deferred given prior bleeding issues.  #  Chronic kidney disease stage IIIa Patient with hx of CKD, baseline Cr 1.3-1.4. Cr this AM 1.39. -Continue to monitor renal function closely during diuresis.  This patient's plan of care was discussed and created with Dr. Corky Sing and he is in agreement.  Signed: Gale Journey, PA-C  09/13/2023, 9:45 AM [facility]

## 2023-09-13 NOTE — ED Notes (Signed)
Upon assessment pt stated " No one has came too see me to talk about my foot". Pt appears agitated and says that he would know if someone came to see him and he's not dumb. This nurse reassured pt that we would figure it out. Pt R foot is draining blood on floor. Pt advised that this nurse will dress and clean foot. Pt is agreeable.

## 2023-09-13 NOTE — ED Notes (Signed)
Pt to procedure/ OR

## 2023-09-13 NOTE — ED Notes (Signed)
Gave report to Special procedures nurse

## 2023-09-13 NOTE — Progress Notes (Signed)
Progress Note   Patient: Tyler [name] WJX:[id number] DOB: 1946-09-18 DOA: 09/11/2023     2 DOS: the patient was seen and examined on 09/13/2023   Brief hospital course: Tyler [name] is a 77 y.o. 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  He presented to the hospital because of cough productive of yellowish phlegm, increasing shortness of breath, orthopnea, easy fatigability, about 10 pound weight gain in a week.  He was hospitalized on 08/13/2023 for CHF exacerbation.  His diuretic regimen had been adjusted in the outpatient setting but his symptoms did not improve. He also complained of oozing from the wounds on the bottom of his feet.   He was admitted to the hospital for acute exacerbation of chronic systolic CHF, community-acquired pneumonia, infected plantar ulcers on bilateral feet and right foot osteomyelitis.  Dr. Annamary Rummage, podiatrist, was consulted for right foot osteomyelitis.  He recommended MRI of the foot for further evaluation.  MRI of the left foot showed plantar surface ulcer without evidence of osteomyelitis or abscess.  MRI of the right foot showed septic arthritis of right hallux IPJ, and OM of the proximal and distal phalanx.  Podiatry was consulted who recommended right hallux amputation versus ray resection and left foot ulcer debridement.  Patient was also seen by vascular surgery who performed right lower extremity arteriogram.  Patient underwent angioplasty and stent placement of right SFA and popliteal arteries 12/20.  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  Plan for or with podiatry for R great toe and left foot plantar surface ulcer.  -Continue IV antibiotics pending cultures    Acute on chronic heart failure with reduced ejection fraction (HFrEF,30-35 %) (HCC) Nonischemic cardiomyopathy Bilateral pleural effusions Moderate mitral regurgitation Patient failing outpatient management Cardiology consulted appreciate recommendations Last echo from October with EF 30 to 35% Continue GDMT with metoprolol and Kerendia Diuresis with IV Bumex Patient previously on Entresto but did not tolerate it (per review of heart failure clinic note earlier in the day) Daily weights with intake and output monitoring    CAP (community acquired pneumonia) Patient with several week history of productive cough Patient with low-grade temp of 99 leukocytosis of 11,000 chest x-ray showing right upper lobe opacity suspicious for pneumonia Received Rocephin and azithromycin in the ED Receiving Rocephin and vancomycin for osteomyelitis Pro-Calc 0.12 Respiratory viral panel negative  -Continue antibiotics per above, will hold azithromycin    H/o Persistent atrial fibrillation (HCC) Continue metoprolol Patient off of Eliquis due to hematuria. (per review of heart failure clinic note from earlier in the day) Patient has been previously referred for ablation Continue aspirin  Complete heart block (HCC)  s/p PACEMAKER In 2021. No acute issues  Uncontrolled type 2 diabetes mellitus with hyperglycemia, without long-term current use of insulin (HCC) Blood sugar over 200 Sliding scale insulin coverage.  Hold home metformin and glipizide  HTN (hypertension) BP controlled Continue metoprolol  Chronic kidney disease, stage 3a (HCC) Renal function at baseline  Anemia due to chronic kidney disease Hemoglobin at baseline      Subjective: Breathing is improved, denies any  chest  pain.  Physical Exam: Vitals:   09/13/23 1615 09/13/23 1630 09/13/23 1700 09/13/23 1730  BP:  133/76 (!) 119/59 98/63  Pulse: 85 91 92 91  Resp: (!) 27 (!) 32 (!) 23 (!) 24  Temp:      TempSrc:      SpO2: 95% 93% 99% 93%  Weight:      Height:       Physical Exam  Constitutional: Well-developed, well-nourished, and in no distress.  Cardiovascular: Normal rate, regular rhythm, intact distal pulses. NO JVD.  2+ edema bilateral lower extremities pulmonary: Non labored breathing on room air, no wheezing or rales  Abdominal: Soft. Normal bowel sounds. Non distended and non tender Musculoskeletal: Normal range of motion.   See H&P for R great toe ulceration and L foot plantar surface ulceration S/p L hallux amputation Neurological: Alert and oriented to person, place, and time. Non focal  Skin: Skin is warm and dry.   Data Reviewed:  I have reviewed all labs and tests     Latest Ref Rng & Units 09/13/2023    4:28 AM 09/12/2023    3:40 AM 09/11/2023    4:27 PM  BMP  Glucose 70 - 99 mg/dL 725  366  440   BUN 8 - 23 mg/dL 58  51  54   Creatinine 0.61 - 1.24 mg/dL 3.47  4.25  9.56   Sodium 135 - 145 mmol/L 136  135  136   Potassium 3.5 - 5.1 mmol/L 3.8  4.2  5.0   Chloride 98 - 111 mmol/L 103  103  106   CO2 22 - 32 mmol/L 20  21  21    Calcium 8.9 - 10.3 mg/dL 8.5  8.6  8.9       Latest Ref Rng & Units 09/12/2023    3:40 AM 09/11/2023    4:27 PM 08/18/2023    3:06 AM  CBC  WBC 4.0 - 10.5 K/uL 9.9  11.3  7.7   Hemoglobin 13.0 - 17.0 g/dL 9.4  38.7  56.4   Hematocrit 39.0 - 52.0 % 30.9  33.8  31.7   Platelets 150 - 400 K/uL 234  298  225      Family Communication: Daughter is at bedside  Disposition: Status is: Inpatient Remains inpatient appropriate because: R great toe septic arthritis  Planned Discharge Destination:  Pending     Time spent: 35 minutes  Author: Marolyn Haller, MD 09/13/2023 6:03 PM  For on call review [email or web address].

## 2023-09-13 NOTE — Progress Notes (Signed)
Heart Failure Stewardship Pharmacy Note  PCP: Elder Negus, NP PCP-Cardiologist: None  HPI: Tyler [name] is a 77 y.o. male with HTN, HLD, DM, PVD, SSS (s/p PPM), dCHF, CKD-3A, obesity, rheumatic fever, atrial fibrillation not on anticoagulants, chronic diabetic foot ulcers, s/p third metatarsal head resection with bone biopsy right foot who presented with worsening dyspnea, weight gain, PND, and orthopnea. Has been ED visits this year. Troponin on admission was 18. BNP on admission was elevated at 3191.5 CXR on admission showed stable vascular congestion.    Pertinent cardiac history: Long term holter in 03/2020 showed 6.1% PVC burden and multiple AF episodes. Ablation performed and pacemaker placed in 08/2020. Stress test 03/2023 showed no evidence of ischemia and LVEF of 57%. TTE in 08/2020 and 09/2020 showed LVEF of 55-60%. Echo 07/03/23 showed LVEF of 30-35% and moderate MR.   Pertinent Lab Values: Creatinine  Date Value Ref Range Status  07/10/2014 1.26 0.60 - 1.30 mg/dL Final   Creatinine, Ser  Date Value Ref Range Status  09/12/2023 1.45 (H) 0.61 - 1.24 mg/dL Final   BUN  Date Value Ref Range Status  09/12/2023 51 (H) 8 - 23 mg/dL Final  16/06/9603 10 7 - 18 mg/dL Final   Potassium  Date Value Ref Range Status  09/12/2023 4.2 3.5 - 5.1 mmol/L Final  07/10/2014 3.7 3.5 - 5.1 mmol/L Final   Sodium  Date Value Ref Range Status  09/12/2023 135 135 - 145 mmol/L Final  07/10/2014 142 136 - 145 mmol/L Final   B Natriuretic Peptide  Date Value Ref Range Status  09/11/2023 3,874.2 (H) 0.0 - 100.0 pg/mL Final    Comment:    Performed at [facility], [address] [place], [place] [address]   Magnesium  Date Value Ref Range Status  08/17/2023 2.3 1.7 - 2.4 mg/dL Final    Comment:    Performed at [facility], [address] [place], [place] [address]  07/10/2014 1.8 mg/dL Final    Comment:    7.8-2.9 THERAPEUTIC RANGE: 4-7 mg/dL TOXIC: > 10  mg/dL  -----------------------    Hemoglobin A1C  Date Value Ref Range Status  07/05/2014 10.4 (H) 4.2 - 6.3 % Final    Comment:    The American Diabetes Association recommends that a primary goal of therapy should be <7% and that physicians should reevaluate the treatment regimen in patients with HbA1c values consistently >8%. Hemoglobin A1c - ** THIS IS A CORRECTED REPORT **  - PLEASE DISREGARD PREVIOUS RESULT.  - Corrected report called to and read back  - by Crista Elliot on 1C @ 1155 07/06/14.  - BGB    Hgb A1c MFr Bld  Date Value Ref Range Status  07/02/2023 7.2 (H) 4.8 - 5.6 % Final    Comment:    (NOTE) Pre diabetes:          5.7%-6.4%  Diabetes:              >6.4%  Glycemic control for   <7.0% adults with diabetes    TSH  Date Value Ref Range Status  06/28/2020 1.726 0.350 - 4.500 uIU/mL Final    Comment:    Performed by a 3rd Generation assay with a functional sensitivity of <=0.01 uIU/mL. Performed at [facility], [address] [place], [place] [address]     Vital Signs: Admission weight: none Temp:  [97.7 F (36.5 C)-98.9 F (37.2 C)] 98.1 F (36.7 C) (12/20 0440) Pulse Rate:  [85-100] 85 (  12/20 0700) Cardiac Rhythm: Normal sinus rhythm (12/19 0743) Resp:  [16-34] 22 (12/20 0700) BP: (99-152)/(61-88) 123/81 (12/20 0700) SpO2:  [92 %-99 %] 97 % (12/20 0700) Weight:  [117.9 kg (260 lb)] 117.9 kg (260 lb) (12/19 1234)  Intake/Output Summary (Last 24 hours) at 09/13/2023 0733 Last data filed at 09/13/2023 0700 Gross per 24 hour  Intake 400 ml  Output 600 ml  Net -200 ml   Current Heart Failure Medications:  Loop diuretic: bumetanide 2 mg IV Q8H Beta-Blocker: metoprolol succinate 50 mg daily ACEI/ARB/ARNI:  MRA: none SGLT2i: none   Prior to admission Heart Failure Medications:  Loop diuretic: bumetanide 2 mg po daily Beta-Blocker: metoprolol succinate 50 mg daily ACEI/ARB/ARNI: none MRA: none SGLT2i: none Other: metolazone 2.5  mg twice weekly   Assessment: 1. 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 NYHA class III symptoms.  -Symptoms: Shortness of breath has improved some, but continues to experience dyspnea at rest. LEE is present, especially in feet, however this could be a part of his underlying infectious process. Appetite is still poor. -Volume: Appears to still be hypervolemic on exam. Bumetanide increased to 2 mg IV q8h. Creatinine has improved with diuresis. -Hemodynamics: BP improved to 120-130s/70-80s with HR in 80-90s. -BB: Previously did not tolerate carvedilol due to blurred vision, now on metoprolol succinate 50 mg daily.  -ACE/ARB/ARNI: Did not tolerate Entresto due to dizziness. Can consider restarting losartan. -MRA: Finerenone reordered inpatient. Since this is not on formulary, patient would need to provide supply. -SGLT2i: Not a candidate given chronic foley catheter.  -Given AF and CHADSVASc >3, may benefit from chronic anticoagulation, however with history of bleeding, patient insisted staying off anticoagulation.  -MRI showed septic arthritis/osteomyelitis. Amputation is being planned.  Plan: 1) Medication changes recommended at this time: -None.   2) Patient assistance: -Sherryll Burger and [name] are $0   3) Education: - Patient has been educated on current HF medications and potential additions to HF medication regimen - Patient verbalizes understanding that over the next few months, these medication doses may change and more medications may be added to optimize HF regimen - Patient has been educated on basic disease state pathophysiology and goals of therapy   Medication Assistance / Insurance Benefits Check: Does the patient have prescription insurance? Prescription Insurance: Commercial   Type of insurance plan:     Outpatient Pharmacy: Prior to admission outpatient pharmacy: [facility] Please do not hesitate to reach out with questions or concerns,    Enos Fling, PharmD, CPP, BCPS Heart Failure Pharmacist  Phone - [phone] 08/16/2023 7:35 AM

## 2023-09-13 NOTE — ED Notes (Signed)
Wet to Dry dressing placed on R toe. Pt tolerated well.

## 2023-09-13 NOTE — H&P (View-Only) (Signed)
PODIATRY CONSULTATION  NAME Tyler [name] MRN [id number] DOB 1946-08-30 DOA 09/11/2023   Reason for consult:  Chief Complaint  Patient presents with   Shortness of Breath    Attending/Consulting physician: Acquanetta Chain MD  History of present illness: "77 y.o. 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 patient has had a weight gain of 10 pounds in the past week as well as dyspnea on exertion that has been worsening.  He could only walk a few feet before having to stop to rest.  Also has orthopnea.  He denies chest pain.  He also has a cough that is productive of yellow sputum ongoing for the past few months.  He denies fever or chills.  He was at heart failure clinic earlier in the day on 12/18 and his Lasix was switched out for Bumex patient has had intermittent IV Lasix treatments at the heart failure clinic and in the ED but without significant improvement.    Denies chest pain.  Additionally he states that the wounds at the bottom of his feet seem to be oozing.  He is unclear about wound care to the area.  He was last seen by podiatry 08/06/2023 and ulcers had had clean bases at the time-photos on podiatry note"  Discussed findings and plan with the patient at bedside in the ED.  He states he follows with [name] and wound care. States he didn't feel wound care was doing anything to help his ulcers and the deteriorated because of that.  Thinks he has been controlling his diabetes reports he is not sure what his A1c is recently however.  Is aware that he has poor blood flow to both feet and thinks  that contributed as well to the deterioration of the ulcers.   Past Medical History:  Diagnosis Date   (HFpEF) heart failure with preserved ejection fraction (HCC) 03/01/2020   a.) TTE 03/01/2020: EF 55-60%, mod MAC, mod AoV sclerosis, triv AR, mild TR, mod MR, RVSP 50-59; b.) TTE 09/10/2020: EF 55-60%, mod MAC, mod AoV sclerosis, mild TR, 3+ MR, RVSP 50-59; c.) TTE 09/26/2020: EF 55-60%, mild LA dil, triv PR, mild MR/TR, RVSP 37-49   Adenoma of left adrenal gland    Anemia    Arthritis    Atrial fibrillation and flutter (HCC)    a.) CHA2DS2VASc = 5 (age x2, HFpEF, HTN, T2DM);  b.) s/p CTI ablation 09/07/2020; c.) rate/rhythm maintained on oral carvedilol; not on chronic anticoagulation therapy   CAD (coronary artery disease)    Cardiomegaly    CKD (chronic kidney disease), stage III (HCC)    DOE (dyspnea on exertion)    Drug-induced bradycardia    Gangrene of toe of left foot (HCC)    a.) 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    HLD (hyperlipidemia)    Hypertension    Long term current use of aspirin    Lymphedema of both lower extremities    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)    a.) s/p amputation 11/04/2022   Peripheral vascular disease (HCC)    Pleural effusion on right  09/09/2020   a.) s/p RIGHT thoracentesis with 2180 cc yield   Pneumonia    Presence of permanent cardiac pacemaker 09/10/2020   a.) TVP placement 09/10/2020 due to intermittent CHB in setting of urosepsis; b.) s/p PPM placement 09/15/2020: MDT Azure XT DR (SN: [id number])   Pulmonary hypertension (HCC) 03/01/2020   a.) TTE: 03/01/2020: RVSP 50-59; b.) TTE 09/10/2020: RVSP 50-59; c.) TTE 09/26/2020: RVSP 37-49   RA (rheumatoid arthritis) (HCC)    Rheumatic fever    Sepsis (HCC) 09/10/2020   a.) urosepsis --> BC x 2 sets and UC all grew out significant Proteus mirabilis; admitted to [facility] 09/07/2020 - 09/27/2020.    Sick sinus syndrome [facility])    a.) s/p MDT PPM placement 09/15/2020   T2DM (type 2 diabetes mellitus) (HCC)    Urinary retention    chronic, with indwelling Foley catheter and plans for a suprapubic   Wears dentures    full upper       Latest Ref Rng & Units 09/12/2023    3:40 AM 09/11/2023    4:27 PM 08/18/2023    3:06 AM  CBC  WBC 4.0 - 10.5 K/uL 9.9  11.3  7.7   Hemoglobin 13.0 - 17.0 g/dL 9.4  82.9  56.2   Hematocrit 39.0 - 52.0 % 30.9  33.8  31.7   Platelets 150 - 400 K/uL 234  298  225        Latest Ref Rng & Units 09/13/2023    4:28 AM 09/12/2023    3:40 AM 09/11/2023    4:27 PM  BMP  Glucose 70 - 99 mg/dL 130  865  784   BUN 8 - 23 mg/dL 58  51  54   Creatinine 0.61 - 1.24 mg/dL 6.96  2.95  2.84   Sodium 135 - 145 mmol/L 136  135  136   Potassium 3.5 - 5.1 mmol/L 3.8  4.2  5.0   Chloride 98 - 111 mmol/L 103  103  106   CO2 22 - 32 mmol/L 20  21  21    Calcium 8.9 - 10.3 mg/dL 8.5  8.6  8.9       Physical Exam: Lower Extremity Exam Vasc: R - PT non palpable, DP non palpable.   L - PT non palpable, DP non palpable   Derm: R - Deep ulceration right hallux plantar with significant erythema and edema extending to midfoot. Drainage and malodor present.   L - Ulcer with fibronecrotic tissue plantar midfoot, decreased purulence compared to prior pictures   MSK:  R - Edmea of right foot  L - Edema, prior hallux amp  Neuro: R - Gross sensation absent to toes. Gross motor function intact R 3rd toe amp, 4th met head resection.  L - Gross sensation absent to toes. Gross motor function intact    ASSESSMENT/PLAN OF CARE 77 y.o. male with PMHx significant for  HFrEF, CHB s/p pacemaker,stage III CKD, HTN, A-fib, diabetes with neuropathy and chronic venous stasis, chronic bilateral plantar ulcers  with osteomyelitis of the right hallux and infected ulceration of the left foot without evidence of OM. Also with clinically significant PAD bilateral. ]  WBC 9.9 MRI R  foot: Septic arthritis R hallux IPJ, OM of the proximal and distal phalanx MRI L foot: Ulcer left plantar near med cuneiform, no evidence OM or abscess  - NPO past MN Sat night for OR Sunday AM with Dr. Ralene Cork for Right hallux amputation (vs ray resection as  needed for wound closure) and left foot wound debridement and possible graft application. -Discussed above with patient and he agrees to proceed.  - Continue IV abx broad spectrum pending further culture data -Appreciate vascular recs - plan for RLE angio today around 1200 - Anticoagulation: Per primary/vascular. Pt relates a prior history of blood clot to me - ? Need for heparin gtt - Wound care: Betadine dressing applied this am, change PRN until OR - WB status: WBAT bilateral LE - Will continue to follow   Thank you for the consult.  Please contact me directly with any questions or concerns.           Corinna Gab, [name] [facility]    [address] [place], [place] [address]                Office [phone]  Fax [phone]

## 2023-09-13 NOTE — Telephone Encounter (Signed)
Patient called stating he is in the hospital. Patient states he finally got into the wound center. States they didn't really do much but was going to send supplies to his house. Pt states it wasn't all there what they were going to send. Tried to call pt no answer no voicemail to leave a message. Will try to follow up with him again.

## 2023-09-13 NOTE — Consult Note (Signed)
PODIATRY CONSULTATION  NAME Tyler [name] MRN [id number] DOB 1946-08-30 DOA 09/11/2023   Reason for consult:  Chief Complaint  Patient presents with   Shortness of Breath    Attending/Consulting physician: Acquanetta Chain MD  History of present illness: "77 y.o. 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 patient has had a weight gain of 10 pounds in the past week as well as dyspnea on exertion that has been worsening.  He could only walk a few feet before having to stop to rest.  Also has orthopnea.  He denies chest pain.  He also has a cough that is productive of yellow sputum ongoing for the past few months.  He denies fever or chills.  He was at heart failure clinic earlier in the day on 12/18 and his Lasix was switched out for Bumex patient has had intermittent IV Lasix treatments at the heart failure clinic and in the ED but without significant improvement.    Denies chest pain.  Additionally he states that the wounds at the bottom of his feet seem to be oozing.  He is unclear about wound care to the area.  He was last seen by podiatry 08/06/2023 and ulcers had had clean bases at the time-photos on podiatry note"  Discussed findings and plan with the patient at bedside in the ED.  He states he follows with [name] and wound care. States he didn't feel wound care was doing anything to help his ulcers and the deteriorated because of that.  Thinks he has been controlling his diabetes reports he is not sure what his A1c is recently however.  Is aware that he has poor blood flow to both feet and thinks  that contributed as well to the deterioration of the ulcers.   Past Medical History:  Diagnosis Date   (HFpEF) heart failure with preserved ejection fraction (HCC) 03/01/2020   a.) TTE 03/01/2020: EF 55-60%, mod MAC, mod AoV sclerosis, triv AR, mild TR, mod MR, RVSP 50-59; b.) TTE 09/10/2020: EF 55-60%, mod MAC, mod AoV sclerosis, mild TR, 3+ MR, RVSP 50-59; c.) TTE 09/26/2020: EF 55-60%, mild LA dil, triv PR, mild MR/TR, RVSP 37-49   Adenoma of left adrenal gland    Anemia    Arthritis    Atrial fibrillation and flutter (HCC)    a.) CHA2DS2VASc = 5 (age x2, HFpEF, HTN, T2DM);  b.) s/p CTI ablation 09/07/2020; c.) rate/rhythm maintained on oral carvedilol; not on chronic anticoagulation therapy   CAD (coronary artery disease)    Cardiomegaly    CKD (chronic kidney disease), stage III (HCC)    DOE (dyspnea on exertion)    Drug-induced bradycardia    Gangrene of toe of left foot (HCC)    a.) 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    HLD (hyperlipidemia)    Hypertension    Long term current use of aspirin    Lymphedema of both lower extremities    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)    a.) s/p amputation 11/04/2022   Peripheral vascular disease (HCC)    Pleural effusion on right  09/09/2020   a.) s/p RIGHT thoracentesis with 2180 cc yield   Pneumonia    Presence of permanent cardiac pacemaker 09/10/2020   a.) TVP placement 09/10/2020 due to intermittent CHB in setting of urosepsis; b.) s/p PPM placement 09/15/2020: MDT Azure XT DR (SN: [id number])   Pulmonary hypertension (HCC) 03/01/2020   a.) TTE: 03/01/2020: RVSP 50-59; b.) TTE 09/10/2020: RVSP 50-59; c.) TTE 09/26/2020: RVSP 37-49   RA (rheumatoid arthritis) (HCC)    Rheumatic fever    Sepsis (HCC) 09/10/2020   a.) urosepsis --> BC x 2 sets and UC all grew out significant Proteus mirabilis; admitted to [facility] 09/07/2020 - 09/27/2020.    Sick sinus syndrome [facility])    a.) s/p MDT PPM placement 09/15/2020   T2DM (type 2 diabetes mellitus) (HCC)    Urinary retention    chronic, with indwelling Foley catheter and plans for a suprapubic   Wears dentures    full upper       Latest Ref Rng & Units 09/12/2023    3:40 AM 09/11/2023    4:27 PM 08/18/2023    3:06 AM  CBC  WBC 4.0 - 10.5 K/uL 9.9  11.3  7.7   Hemoglobin 13.0 - 17.0 g/dL 9.4  82.9  56.2   Hematocrit 39.0 - 52.0 % 30.9  33.8  31.7   Platelets 150 - 400 K/uL 234  298  225        Latest Ref Rng & Units 09/13/2023    4:28 AM 09/12/2023    3:40 AM 09/11/2023    4:27 PM  BMP  Glucose 70 - 99 mg/dL 130  865  784   BUN 8 - 23 mg/dL 58  51  54   Creatinine 0.61 - 1.24 mg/dL 6.96  2.95  2.84   Sodium 135 - 145 mmol/L 136  135  136   Potassium 3.5 - 5.1 mmol/L 3.8  4.2  5.0   Chloride 98 - 111 mmol/L 103  103  106   CO2 22 - 32 mmol/L 20  21  21    Calcium 8.9 - 10.3 mg/dL 8.5  8.6  8.9       Physical Exam: Lower Extremity Exam Vasc: R - PT non palpable, DP non palpable.   L - PT non palpable, DP non palpable   Derm: R - Deep ulceration right hallux plantar with significant erythema and edema extending to midfoot. Drainage and malodor present.   L - Ulcer with fibronecrotic tissue plantar midfoot, decreased purulence compared to prior pictures   MSK:  R - Edmea of right foot  L - Edema, prior hallux amp  Neuro: R - Gross sensation absent to toes. Gross motor function intact R 3rd toe amp, 4th met head resection.  L - Gross sensation absent to toes. Gross motor function intact    ASSESSMENT/PLAN OF CARE 77 y.o. male with PMHx significant for  HFrEF, CHB s/p pacemaker,stage III CKD, HTN, A-fib, diabetes with neuropathy and chronic venous stasis, chronic bilateral plantar ulcers  with osteomyelitis of the right hallux and infected ulceration of the left foot without evidence of OM. Also with clinically significant PAD bilateral. ]  WBC 9.9 MRI R  foot: Septic arthritis R hallux IPJ, OM of the proximal and distal phalanx MRI L foot: Ulcer left plantar near med cuneiform, no evidence OM or abscess  - NPO past MN Sat night for OR Sunday AM with Dr. Ralene Cork for Right hallux amputation (vs ray resection as  needed for wound closure) and left foot wound debridement and possible graft application. -Discussed above with patient and he agrees to proceed.  - Continue IV abx broad spectrum pending further culture data -Appreciate vascular recs - plan for RLE angio today around 1200 - Anticoagulation: Per primary/vascular. Pt relates a prior history of blood clot to me - ? Need for heparin gtt - Wound care: Betadine dressing applied this am, change PRN until OR - WB status: WBAT bilateral LE - Will continue to follow   Thank you for the consult.  Please contact me directly with any questions or concerns.           Corinna Gab, [name] [facility]    [address] [place], [place] [address]                Office [phone]  Fax [phone]

## 2023-09-14 LAB — BASIC METABOLIC PANEL
Anion gap: 10 (ref 5–15)
BUN: 52 mg/dL — ABNORMAL HIGH (ref 8–23)
CO2: 23 mmol/L (ref 22–32)
Calcium: 8.8 mg/dL — ABNORMAL LOW (ref 8.9–10.3)
Chloride: 104 mmol/L (ref 98–111)
Creatinine, Ser: 1.31 mg/dL — ABNORMAL HIGH (ref 0.61–1.24)
GFR, Estimated: 55 mL/min — ABNORMAL LOW (ref >60)
Glucose, Bld: 184 mg/dL — ABNORMAL HIGH (ref 70–99)
Potassium: 3.5 mmol/L (ref 3.5–5.1)
Sodium: 137 mmol/L (ref 135–145)

## 2023-09-14 LAB — CBC
HCT: 33.4 % — ABNORMAL LOW (ref 39.0–52.0)
Hemoglobin: 10.5 g/dL — ABNORMAL LOW (ref 13.0–17.0)
MCH: 26.9 pg (ref 26.0–34.0)
MCHC: 31.4 g/dL (ref 30.0–36.0)
MCV: 85.4 fL (ref 80.0–100.0)
Platelets: 267 10*3/uL (ref 150–400)
RBC: 3.91 MIL/uL — ABNORMAL LOW (ref 4.22–5.81)
RDW: 16.2 % — ABNORMAL HIGH (ref 11.5–15.5)
WBC: 9.8 10*3/uL (ref 4.0–10.5)
nRBC: 0 % (ref 0.0–0.2)

## 2023-09-14 LAB — GLUCOSE, CAPILLARY
Glucose-Capillary: 184 mg/dL — ABNORMAL HIGH (ref 70–99)
Glucose-Capillary: 195 mg/dL — ABNORMAL HIGH (ref 70–99)
Glucose-Capillary: 183 mg/dL — ABNORMAL HIGH (ref 70–99)
Glucose-Capillary: 189 mg/dL — ABNORMAL HIGH (ref 70–99)
Glucose-Capillary: 143 mg/dL — ABNORMAL HIGH (ref 70–99)
Glucose-Capillary: 155 mg/dL — ABNORMAL HIGH (ref 70–99)

## 2023-09-14 LAB — MAGNESIUM: Magnesium: 1.9 mg/dL (ref 1.7–2.4)

## 2023-09-14 MED ORDER — MAGNESIUM SULFATE 2 GM/50ML IV SOLN
2.0000 g | Freq: Once | INTRAVENOUS | Status: AC
  Administered 2023-09-14: 2 g via INTRAVENOUS
  Filled 2023-09-14: qty 50

## 2023-09-14 MED ORDER — POTASSIUM CHLORIDE 20 MEQ PO PACK
20.0000 meq | PACK | Freq: Once | ORAL | Status: AC
  Administered 2023-09-14: 20 meq via ORAL
  Filled 2023-09-14: qty 1

## 2023-09-14 MED ORDER — INSULIN GLARGINE-YFGN 100 UNIT/ML ~~LOC~~ SOLN
5.0000 [IU] | Freq: Every day | SUBCUTANEOUS | Status: DC
  Administered 2023-09-14: 5 [IU] via SUBCUTANEOUS
  Filled 2023-09-14: qty 0.05

## 2023-09-14 NOTE — Progress Notes (Signed)
Progress Note   Patient: Tyler [name] ZOX:[id number] DOB: [birth year] DOA: 09/11/2023     3 DOS: the patient was seen and examined on 09/14/2023   Brief hospital course: Tyler [name] is a 77 y.o. 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  He presented to the hospital because of cough productive of yellowish phlegm, increasing shortness of breath, orthopnea, easy fatigability, about 10 pound weight gain in a week.  He was hospitalized on 08/13/2023 for CHF exacerbation.  His diuretic regimen had been adjusted in the outpatient setting but his symptoms did not improve. He also complained of oozing from the wounds on the bottom of his feet.   He was admitted to the hospital for acute exacerbation of chronic systolic CHF, community-acquired pneumonia, infected plantar ulcers on bilateral feet and right foot osteomyelitis.  Dr. Annamary Rummage, podiatrist, was consulted for right foot osteomyelitis.  He recommended MRI of the foot for further evaluation.  MRI of the left foot showed plantar surface ulcer without evidence of osteomyelitis or abscess.  MRI of the right foot showed septic arthritis of right hallux IPJ, and OM of the proximal and distal phalanx.  Podiatry was consulted who recommended right hallux amputation versus ray resection and left foot ulcer debridement.  Patient was also seen by vascular surgery who performed right lower extremity arteriogram.  Patient underwent angioplasty and stent placement of right SFA and popliteal arteries 12/20.  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  Plan for or with podiatry for R great toe and left foot plantar surface ulcer 12/22 -Continue IV antibiotics, vancomycin and ceftriaxone pending cultures from OR    Acute on chronic heart failure with reduced ejection fraction (HFrEF,30-35 %) (HCC) Nonischemic cardiomyopathy Bilateral pleural effusions Moderate mitral regurgitation Patient failing outpatient management Cardiology consulted appreciate recommendations Last echo from October with EF 30 to 35% Diuresis with IV Bumex, net -3.5 L per chart Patient previously on Entresto but did not tolerate it (per review of heart failure clinic note earlier in the day) Continue GDMT with metoprolol and Kerendia Daily weights with intake and output monitoring    ?CAP (community acquired pneumonia) Patient with several week history of productive cough Patient with low-grade temp of 99 leukocytosis of 11,000 chest x-ray showing right upper lobe opacity suspicious for pneumonia Received Rocephin and azithromycin in the ED Receiving Rocephin and vancomycin for osteomyelitis Pro-Calc 0.12 Respiratory viral panel negative  -Continue antibiotics per above   H/o Persistent atrial fibrillation (HCC) Continue metoprolol Patient off of Eliquis due to hematuria. (per review of heart failure clinic note from earlier in the day) Patient has been previously referred for ablation Continue aspirin  Complete heart block (HCC)   s/p PACEMAKER In 2021. No acute issues  Uncontrolled type 2 diabetes mellitus with hyperglycemia, without long-term current use of insulin (HCC) A1c 7.2 2 months ago.  -Start Semglee 5 units nightly -Continue sliding scale insulin coverage.  Hold home metformin and glipizide  HTN (hypertension) BP controlled Continue metoprolol  Chronic kidney disease, stage 3a (HCC) Renal function at baseline  Anemia due to chronic kidney  disease Hemoglobin at baseline     Latest Ref Rng & Units 09/14/2023    2:58 AM 09/12/2023    3:40 AM 09/11/2023    4:27 PM  CBC  WBC 4.0 - 10.5 K/uL 9.8  9.9  11.3   Hemoglobin 13.0 - 17.0 g/dL 46.9  9.4  62.9   Hematocrit 39.0 - 52.0 % 33.4  30.9  33.8   Platelets 150 - 400 K/uL 267  234  298         Subjective: Breathing is improved, denies any chest pain.  Physical Exam: Vitals:   09/14/23 0700 09/14/23 0800 09/14/23 1000 09/14/23 1200  BP: (!) 120/57 (!) 107/59 117/61 129/69  Pulse: 62 63 80 70  Resp: (!) 22 20 17 12   Temp:  97.8 F (36.6 C)  98.1 F (36.7 C)  TempSrc:  Oral  Oral  SpO2: 99% 99% 100% 98%  Weight:      Height:       Physical Exam  Constitutional: In no distress.  Cardiovascular: Normal rate, regular rhythm.  2+ edema bilateral lower extremities Pulmonary: Non labored breathing on room air, no wheezing or rales.  Abdominal: Soft. Normal bowel sounds. Non distended and non tender Musculoskeletal: Normal range of motion.   R and L extremities bandagedd  Neurological: Alert and oriented to person, place, and time. Non focal  Skin: Skin is warm and dry.    Data Reviewed:  I have reviewed all labs and tests     Latest Ref Rng & Units 09/14/2023    2:58 AM 09/13/2023    4:28 AM 09/12/2023    3:40 AM  BMP  Glucose 70 - 99 mg/dL 528  413  244   BUN 8 - 23 mg/dL 52  58  51   Creatinine 0.61 - 1.24 mg/dL 0.10  2.72  5.36   Sodium 135 - 145 mmol/L 137  136  135   Potassium 3.5 - 5.1 mmol/L 3.5  3.8  4.2   Chloride 98 - 111 mmol/L 104  103  103   CO2 22 - 32 mmol/L 23  20  21    Calcium 8.9 - 10.3 mg/dL 8.8  8.5  8.6       Latest Ref Rng & Units 09/14/2023    2:58 AM 09/12/2023    3:40 AM 09/11/2023    4:27 PM  CBC  WBC 4.0 - 10.5 K/uL 9.8  9.9  11.3   Hemoglobin 13.0 - 17.0 g/dL 64.4  9.4  03.4   Hematocrit 39.0 - 52.0 % 33.4  30.9  33.8   Platelets 150 - 400 K/uL 267  234  298      Family Communication: Daughter is at  bedside  Disposition: Status is: Inpatient Remains inpatient appropriate because: R great toe septic arthritis  Planned Discharge Destination:  Pending     Time spent: 35 minutes  Author: Marolyn Haller, MD 09/14/2023 5:16 PM  For on call review [email or web address].

## 2023-09-14 NOTE — Progress Notes (Signed)
      Daily Progress Note   Assessment/Planning:   POD #1 s/p PTA+S R fem-pop  Completion angiogram demonstrate inline ATA flow to R foot Pt reportedly going to OR with Pod Sat/Sun Call office for follow up in 2 weeks   Subjective  - 1 Day Post-Op   C/o being bed bound   Objective   Vitals:   09/14/23 0200 09/14/23 0300 09/14/23 0400 09/14/23 0500  BP: 129/70 113/78 112/73   Pulse: 84 87 87   Resp: (!) 23 15 (!) 21   Temp:   98.4 F (36.9 C)   TempSrc:   Oral   SpO2: 100% 99% 100%   Weight:    118 kg  Height:         Intake/Output Summary (Last 24 hours) at 09/14/2023 0839 Last data filed at 09/14/2023 0353 Gross per 24 hour  Intake 790 ml  Output 4150 ml  Net -3360 ml    VASC L groin: no hematoma or echymosis; no palpable pulses in either feet    Laboratory   CBC    Latest Ref Rng & Units 09/14/2023    2:58 AM 09/12/2023    3:40 AM 09/11/2023    4:27 PM  CBC  WBC 4.0 - 10.5 K/uL 9.8  9.9  11.3   Hemoglobin 13.0 - 17.0 g/dL 06.2  9.4  69.4   Hematocrit 39.0 - 52.0 % 33.4  30.9  33.8   Platelets 150 - 400 K/uL 267  234  298     BMET    Component Value Date/Time   NA 137 09/14/2023 0258   NA 142 07/10/2014 0549   K 3.5 09/14/2023 0258   K 3.7 07/10/2014 0549   CL 104 09/14/2023 0258   CL 109 (H) 07/10/2014 0549   CO2 23 09/14/2023 0258   CO2 24 07/10/2014 0549   GLUCOSE 184 (H) 09/14/2023 0258   GLUCOSE 134 (H) 07/10/2014 0549   BUN 52 (H) 09/14/2023 0258   BUN 10 07/10/2014 0549   CREATININE 1.31 (H) 09/14/2023 0258   CREATININE 1.26 07/10/2014 0549   CALCIUM 8.8 (L) 09/14/2023 0258   CALCIUM 8.3 (L) 07/10/2014 0549   GFRNONAA 55 (L) 09/14/2023 0258   GFRNONAA >60 07/10/2014 0549   GFRAA >60 07/07/2018 0440   GFRAA >60 07/10/2014 0549     Leonides Sake, MD, FACS, FSVS Covering for [facility]: [phone]  09/14/2023, 8:39 AM

## 2023-09-14 NOTE — Plan of Care (Signed)
  Problem: Education: Goal: Ability to describe self-care measures that may prevent or decrease complications (Diabetes Survival Skills Education) will improve Outcome: Progressing Goal: Individualized Educational Video(s) Outcome: Progressing   Problem: Coping: Goal: Ability to adjust to condition or change in health will improve Outcome: Progressing   Problem: Fluid Volume: Goal: Ability to maintain a balanced intake and output will improve Outcome: Progressing   Problem: Health Behavior/Discharge Planning: Goal: Ability to identify and utilize available resources and services will improve Outcome: Progressing Goal: Ability to manage health-related needs will improve Outcome: Progressing   Problem: Metabolic: Goal: Ability to maintain appropriate glucose levels will improve Outcome: Progressing   Problem: Nutritional: Goal: Maintenance of adequate nutrition will improve Outcome: Progressing Goal: Progress toward achieving an optimal weight will improve Outcome: Progressing   Problem: Skin Integrity: Goal: Risk for impaired skin integrity will decrease Outcome: Progressing   Problem: Tissue Perfusion: Goal: Adequacy of tissue perfusion will improve Outcome: Progressing   Problem: Education: Goal: Ability to demonstrate management of disease process will improve Outcome: Progressing Goal: Ability to verbalize understanding of medication therapies will improve Outcome: Progressing Goal: Individualized Educational Video(s) Outcome: Progressing   Problem: Activity: Goal: Capacity to carry out activities will improve Outcome: Progressing   Problem: Cardiac: Goal: Ability to achieve and maintain adequate cardiopulmonary perfusion will improve Outcome: Progressing   Problem: Education: Goal: Understanding of CV disease, CV risk reduction, and recovery process will improve Outcome: Progressing Goal: Individualized Educational Video(s) Outcome: Progressing    Problem: Activity: Goal: Ability to return to baseline activity level will improve Outcome: Progressing   Problem: Cardiovascular: Goal: Ability to achieve and maintain adequate cardiovascular perfusion will improve Outcome: Progressing Goal: Vascular access site(s) Level 0-1 will be maintained Outcome: Progressing   Problem: Health Behavior/Discharge Planning: Goal: Ability to safely manage health-related needs after discharge will improve Outcome: Progressing   Problem: Education: Goal: Knowledge of General Education information will improve Description: Including pain rating scale, medication(s)/side effects and non-pharmacologic comfort measures Outcome: Progressing   Problem: Health Behavior/Discharge Planning: Goal: Ability to manage health-related needs will improve Outcome: Progressing   Problem: Clinical Measurements: Goal: Ability to maintain clinical measurements within normal limits will improve Outcome: Progressing Goal: Will remain free from infection Outcome: Progressing Goal: Diagnostic test results will improve Outcome: Progressing Goal: Respiratory complications will improve Outcome: Progressing Goal: Cardiovascular complication will be avoided Outcome: Progressing   Problem: Activity: Goal: Risk for activity intolerance will decrease Outcome: Progressing   Problem: Nutrition: Goal: Adequate nutrition will be maintained Outcome: Progressing   Problem: Coping: Goal: Level of anxiety will decrease Outcome: Progressing   Problem: Elimination: Goal: Will not experience complications related to bowel motility Outcome: Progressing Goal: Will not experience complications related to urinary retention Outcome: Progressing   Problem: Pain Management: Goal: General experience of comfort will improve Outcome: Progressing   Problem: Safety: Goal: Ability to remain free from injury will improve Outcome: Progressing   Problem: Skin Integrity: Goal:  Risk for impaired skin integrity will decrease Outcome: Progressing

## 2023-09-14 NOTE — Progress Notes (Signed)
[facility] Cardiology  CARDIOLOGY PROGRESS NOTE  Patient ID: Tyler [name] MRN: [id number] DOB/AGE: 1946/01/12 77 y.o.  Admit date: 09/11/2023 Referring Physician Dr. Montez Morita Reason for Consultation heart failure  HPI: Patient presented to hospital with worsening shortness of breath, pedal edema, being managed for decompensated heart failure with reduced EF.  Also having foot issues.  Diuresed well in last 24 hours with increased Bumex dose.  Feels breathing is better.  Review of systems complete and found to be negative unless listed above     Past Medical History:  Diagnosis Date   (HFpEF) heart failure with preserved ejection fraction (HCC) 03/01/2020   a.) TTE 03/01/2020: EF 55-60%, mod MAC, mod AoV sclerosis, triv AR, mild TR, mod MR, RVSP 50-59; b.) TTE 09/10/2020: EF 55-60%, mod MAC, mod AoV sclerosis, mild TR, 3+ MR, RVSP 50-59; c.) TTE 09/26/2020: EF 55-60%, mild LA dil, triv PR, mild MR/TR, RVSP 37-49   Adenoma of left adrenal gland    Anemia    Arthritis    Atrial fibrillation and flutter (HCC)    a.) CHA2DS2VASc = 5 (age x2, HFpEF, HTN, T2DM);  b.) s/p CTI ablation 09/07/2020; c.) rate/rhythm maintained on oral carvedilol; not on chronic anticoagulation therapy   CAD (coronary artery disease)    Cardiomegaly    CKD (chronic kidney disease), stage III (HCC)    DOE (dyspnea on exertion)    Drug-induced bradycardia    Gangrene of toe of left foot (HCC)    a.) 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    HLD (hyperlipidemia)    Hypertension    Long term current use of aspirin    Lymphedema of both lower extremities    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)    a.) s/p amputation 11/04/2022   Peripheral vascular disease (HCC)    Pleural effusion on right 09/09/2020   a.) s/p RIGHT thoracentesis with 2180 cc yield   Pneumonia    Presence of permanent cardiac pacemaker 09/10/2020   a.) TVP placement  09/10/2020 due to intermittent CHB in setting of urosepsis; b.) s/p PPM placement 09/15/2020: MDT Azure XT DR (SN: [id number])   Pulmonary hypertension (HCC) 03/01/2020   a.) TTE: 03/01/2020: RVSP 50-59; b.) TTE 09/10/2020: RVSP 50-59; c.) TTE 09/26/2020: RVSP 37-49   RA (rheumatoid arthritis) (HCC)    Rheumatic fever    Sepsis (HCC) 09/10/2020   a.) urosepsis --> BC x 2 sets and UC all grew out significant Proteus mirabilis; admitted to [facility] 09/07/2020 - 09/27/2020.   Sick sinus syndrome [facility])    a.) s/p MDT PPM placement 09/15/2020   T2DM (type 2 diabetes mellitus) (HCC)    Urinary retention    chronic, with indwelling Foley catheter and plans for a suprapubic   Wears dentures    full upper    Past Surgical History:  Procedure Laterality Date   AMPUTATION TOE Right 11/04/2022   Procedure: AMPUTATION TOE;  Surgeon: Felecia Shelling, [name];  [name];  Service: Podiatry;  Laterality: Right;   BONE BIOPSY Right 04/05/2023   Procedure: BONE BIOPSY THIRD & FOURTH;  Surgeon: Felecia Shelling, [name];  [name];  Service: Podiatry;  Laterality: Right;   CARDIAC ELECTROPHYSIOLOGY STUDY AND ABLATION N/A 09/07/2020   Procedure: CARDIAC EP STUDY AND ABLATION (CTI)   CATARACT EXTRACTION W/PHACO Right 01/16/2017   Procedure: CATARACT EXTRACTION PHACO AND INTRAOCULAR LENS PLACEMENT (IOC)  Right  Complicated;  Surgeon: Lockie Mola, MD;  Location: [facility];  Service: Ophthalmology;  Laterality: Right;  IVA Block Healon 5 malyugin vision blue Diabetic - oral meds   CATARACT EXTRACTION W/PHACO Left 10/23/2022   Procedure: CATARACT EXTRACTION PHACO AND INTRAOCULAR LENS PLACEMENT (IOC) LEFT DIABETIC;  Surgeon: Galen Manila, MD;  Location: [facility];  Service: Ophthalmology;  Laterality: Left;  Diabetic   COLONOSCOPY     LOWER EXTREMITY ANGIOGRAPHY Right 11/02/2022   Procedure: Lower Extremity Angiography;  Surgeon: Annice Needy, MD;  Location:  [facility];  Service: Cardiovascular;  Laterality: Right;   METATARSAL HEAD EXCISION Left 07/05/2018   Procedure: RESECTION FIRST METATARSAL INFECTED BONE AND SOFT TISSUE;  Surgeon: Recardo Evangelist, [name];  [name];  Service: Podiatry;  Laterality: Left;   METATARSAL HEAD EXCISION Right 04/05/2023   Procedure: METATARSAL HEAD EXCISION THIRD & FOURTH;  Surgeon: Felecia Shelling, [name];  [name];  Service: Podiatry;  Laterality: Right;   PACEMAKER INSERTION  09/15/2020   TOE AMPUTATION Left    TONSILLECTOMY      Medications Prior to Admission  Medication Sig Dispense Refill Last Dose/Taking   acetaminophen (TYLENOL) 500 MG tablet Take 2 tablets (1,000 mg total) by mouth 3 (three) times daily as needed (for pain).   Taking As Needed   aspirin EC 81 MG tablet Take 1 tablet (81 mg total) by mouth daily. Swallow whole. 30 tablet 0 09/11/2023   bumetanide (BUMEX) 2 MG tablet Take 1 tablet (2 mg total) by mouth daily. 90 tablet 3 09/11/2023   Finerenone (KERENDIA) 10 MG TABS Take 1 tablet (10 mg total) by mouth daily. 30 tablet 2 09/11/2023 Morning   gentamicin cream (GARAMYCIN) 0.1 % Apply 1 Application topically 2 (two) times daily. 30 g 1 09/11/2023 Morning   glipiZIDE (GLUCOTROL) 10 MG tablet Take 1 tablet (10 mg total) by mouth 2 (two) times daily. 60 tablet 2 09/11/2023 Morning   metFORMIN (GLUCOPHAGE) 1000 MG tablet Take 1 tablet (1,000 mg total) by mouth 2 (two) times daily. 60 tablet 2 09/11/2023 Morning   metoprolol succinate (TOPROL-XL) 50 MG 24 hr tablet Take 1 tablet (50 mg total) by mouth daily. Take with or immediately following a meal. 30 tablet 2 09/11/2023   Multiple Vitamins-Minerals (MULTIVITAMIN WITH MINERALS) tablet Take 1 tablet by mouth daily.   09/11/2023   silver sulfADIAZINE (SILVADENE) 1 % cream Apply to affected area daily 50 g 1 09/11/2023   Social History   Socioeconomic History   Marital status: Single    Spouse name: Not on file   Number  of children: Not on file   Years of education: Not on file   Highest education level: Not on file  Occupational History    Comment: [employer]  Tobacco Use   Smoking status: Never   Smokeless tobacco: Never  Vaping Use   Vaping status: Never Used  Substance and Sexual Activity   Alcohol use: Not Currently    Comment: rarely   Drug use: Never   Sexual activity: Not Currently    Birth control/protection: None  Other Topics Concern   Not on file  Social History Narrative   Lives with roommate   Social Drivers of Health   Financial Resource Strain: Low Risk  (03/20/2023)   Received from [facility]   Overall Financial Resource Strain (CARDIA)    Difficulty of Paying Living Expenses: Not hard at all  Food Insecurity: No Food Insecurity (  09/13/2023)   Hunger Vital Sign    Worried About Running Out of Food in the Last Year: Never true    Ran Out of Food in the Last Year: Never true  Transportation Needs: No Transportation Needs (08/14/2023)   PRAPARE - Administrator, Civil Service (Medical): No    Lack of Transportation (Non-Medical): No  Physical Activity: Not on file  Stress: No Stress Concern Present (09/07/2020)   Received from [facility], [facility] Occupational [facility] - Occupational Stress Questionnaire    Feeling of Stress : Not at all  Social Connections: Unknown (02/01/2022)   Received from [facility], [facility]   Social Network    Social Network: Not on file  Intimate Partner Violence: Not At Risk (08/14/2023)   Humiliation, Afraid, Rape, and Kick questionnaire    Fear of Current or Ex-Partner: No    Emotionally Abused: No    Physically Abused: No    Sexually Abused: No    Family History  Problem Relation Age of Onset   Cancer Niece       Review of systems complete and found to be negative unless listed above      PHYSICAL EXAM  Alert and oriented x 3 Bilateral +2 pedal  edema Systolic murmur left lower sternal border No wheeze  Labs:   Lab Results  Component Value Date   WBC 9.8 09/14/2023   HGB 10.5 (L) 09/14/2023   HCT 33.4 (L) 09/14/2023   MCV 85.4 09/14/2023   PLT 267 09/14/2023    Recent Labs  Lab 09/14/23 0258  NA 137  K 3.5  CL 104  CO2 23  BUN 52*  CREATININE 1.31*  CALCIUM 8.8*  GLUCOSE 184*   Lab Results  Component Value Date   TROPONINI 0.05 (HH) 07/03/2018    Lab Results  Component Value Date   CHOL 148 07/03/2023   CHOL 102 07/07/2014   Lab Results  Component Value Date   HDL 31 (L) 07/03/2023   HDL 25 (L) 07/07/2014   Lab Results  Component Value Date   LDLCALC 95 07/03/2023   LDLCALC 55 07/07/2014   Lab Results  Component Value Date   TRIG 111 07/03/2023   TRIG 112 07/07/2014   Lab Results  Component Value Date   CHOLHDL 4.8 07/03/2023   No results found for: "LDLDIRECT"    Radiology: PERIPHERAL VASCULAR CATHETERIZATION Result Date: 09/13/2023 See surgical note for result.  MR FOOT LEFT WO CONTRAST Result Date: 09/12/2023 CLINICAL DATA:  Plantar foot ulcer. EXAM: MRI OF THE LEFT FOOT WITHOUT CONTRAST TECHNIQUE: Multiplanar, multisequence MR imaging of the left forefoot was performed. No intravenous contrast was administered. COMPARISON:  Left foot x-rays from yesterday. CT left foot dated July 06, 2023. FINDINGS: Bones/Joint/Cartilage No suspicious marrow signal abnormality. Subtle periarticular marrow edema along the plantar aspect of the first TMT joint is also seen at the dorsal aspect of the joint and is most likely degenerative. 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 No fracture or dislocation. 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 Mild midfoot degenerative changes. No joint effusion. Ligaments Collateral ligaments are intact. Muscles and Tendons No tenosynovitis. Complete fatty atrophy of  the intrinsic foot muscles. Soft tissue Ulceration along the medial plantar aspect of the midfoot near the base of the medial cuneiform. Diffuse soft tissue swelling. No fluid collection. No soft tissue mass. IMPRESSION: 1. Skin  ulceration along the medial plantar aspect of the midfoot near the base of the medial cuneiform. No evidence of osteomyelitis or abscess. Electronically Signed   By: [name] M.D.   On: 09/12/2023 18:01   MR FOOT RIGHT WO CONTRAST Result Date: 09/12/2023 CLINICAL DATA:  Right foot ulcer. EXAM: MRI OF THE RIGHT FOREFOOT WITHOUT CONTRAST TECHNIQUE: Multiplanar, multisequence MR imaging of the right forefoot was performed. No intravenous contrast was administered. COMPARISON:  Right foot x-rays from yesterday. CT right foot dated July 06, 2023. FINDINGS: Bones/Joint/Cartilage Abnormal marrow edema involving the first proximal phalanx head and majority of the first distal phalanx. First IP joint effusion. Prior third ray distal fourth metatarsal amputations. No fracture or dislocation. Unchanged severe first MTP joint osteoarthritis. No joint effusion. Ligaments Collateral ligaments are intact. Muscles and Tendons No tenosynovitis. Near complete fatty atrophy of the intrinsic muscles. Soft tissue Deep ulceration along the medial plantar aspect of the great toe with sinus tract extending to the IP joint. Diffuse soft tissue swelling without focal fluid collection. Small ganglion cyst along the plantar aspect of the second metatarsal head. IMPRESSION: 1. Deep ulceration along the medial plantar aspect of the great toe with sinus tract extending to the IP joint. Associated septic arthritis and osteomyelitis of the first proximal and distal phalanges. No abscess. Electronically Signed   By: [name] M.D.   On: 09/12/2023 17:56   DG Foot 2 Views Right Result Date: 09/11/2023 CLINICAL DATA:  Diabetic foot ulcer EXAM: RIGHT FOOT - 2 VIEW COMPARISON:  None Available. FINDINGS:  Transmetatarsal amputation of the right third toe and resection of the right fourth metatarsal head. Large ulcer is seen within the medial and plantar right great toe which extends to the cortex of the base of the distal phalanx with osseous erosion in this location in keeping with changes of osteomyelitis. There is extensive superimposed soft tissue swelling right great toe and second toe as well as moderate soft tissue swelling of the right forefoot diffusely. Vascular calcifications are noted. Advanced degenerative arthritis noted of the first metatarsophalangeal joint. No acute fracture or dislocation. IMPRESSION: 1. Large ulcer within the medial and plantar right great toe which extends to the cortex of the base of the distal phalanx with osseous erosion in this location in keeping with changes of osteomyelitis. 2. Transmetatarsal amputation of the right third toe and resection of the right fourth metatarsal head. Electronically Signed   By: Helyn Numbers M.D.   On: 09/11/2023 22:48   DG Foot 2 Views Left Result Date: 09/11/2023 CLINICAL DATA:  Diabetic ulcer EXAM: LEFT FOOT - 2 VIEW COMPARISON:  None Available. FINDINGS: No acute fracture or dislocation. 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 There is a soft tissue wound within the soft tissues distal to the residual first metatarsal. No definite superimposed osseous erosion. There is diffuse soft tissue swelling of the second and third digits. Posttraumatic changes noted involving the second metatarsal head. Moderate midfoot and tibiotalar degenerative arthritis. Vascular calcifications are noted. IMPRESSION: 1. 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 Soft tissue wound within the soft tissues distal to the residual first metatarsal. No definite superimposed osseous erosion. 2. Diffuse soft tissue swelling of the second and third digits. Electronically Signed   By: Helyn Numbers M.D.   On: 09/11/2023 22:46   DG Chest  2 View Result Date: 09/11/2023 CLINICAL DATA:  Shortness of breath EXAM: CHEST - 2 VIEW COMPARISON:  08/13/2023 FINDINGS: Small bilateral pleural effusions.  No frank interstitial edema. Mild vague right upper lobe opacity, suspicious for pneumonia. No pleural effusion or pneumothorax. Cardiomegaly.  Left subclavian pacemaker. Degenerative changes of the visualized thoracolumbar spine. IMPRESSION: Mild vague right upper lobe opacity, suspicious for pneumonia. Small bilateral pleural effusions. No frank interstitial edema. Electronically Signed   By: Charline Bills M.D.   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  Continue current IV diuresis Continue Toprol-XL, finerenone and rest of medical therapy.  He did not tolerate Entresto as outpatient, will consider low-dose losartan prior to discharge. Patient educated about importance of outpatient follow-up to avoid recurrent heart failure admissions  Signed: Kathryne Gin MD 09/14/2023, 9:54 AM

## 2023-09-15 ENCOUNTER — Inpatient Hospital Stay: Payer: BC Managed Care – PPO

## 2023-09-15 ENCOUNTER — Encounter
Admission: EM | Disposition: A | Discharge status: Home / Self Care | Payer: Self-pay | Source: Home / Self Care | Attending: Student

## 2023-09-15 ENCOUNTER — Inpatient Hospital Stay: Payer: Self-pay | Admitting: Anesthesiology

## 2023-09-15 ENCOUNTER — Other Ambulatory Visit: Payer: Self-pay

## 2023-09-15 DIAGNOSIS — L97422 Non-pressure chronic ulcer of left heel and midfoot with fat layer exposed: Secondary | ICD-10-CM

## 2023-09-15 DIAGNOSIS — E11621 Type 2 diabetes mellitus with foot ulcer: Secondary | ICD-10-CM | POA: Diagnosis not present

## 2023-09-15 DIAGNOSIS — M869 Osteomyelitis, unspecified: Secondary | ICD-10-CM | POA: Diagnosis not present

## 2023-09-15 HISTORY — PX: INCISION AND DRAINAGE OF WOUND: SHX1803

## 2023-09-15 HISTORY — PX: AMPUTATION TOE: SHX6595

## 2023-09-15 LAB — GLUCOSE, CAPILLARY
Glucose-Capillary: 158 mg/dL — ABNORMAL HIGH (ref 70–99)
Glucose-Capillary: 168 mg/dL — ABNORMAL HIGH (ref 70–99)
Glucose-Capillary: 181 mg/dL — ABNORMAL HIGH (ref 70–99)
Glucose-Capillary: 164 mg/dL — ABNORMAL HIGH (ref 70–99)
Glucose-Capillary: 203 mg/dL — ABNORMAL HIGH (ref 70–99)
Glucose-Capillary: 148 mg/dL — ABNORMAL HIGH (ref 70–99)

## 2023-09-15 LAB — CBC
HCT: 32.6 % — ABNORMAL LOW (ref 39.0–52.0)
Hemoglobin: 10 g/dL — ABNORMAL LOW (ref 13.0–17.0)
MCH: 26.2 pg (ref 26.0–34.0)
MCHC: 30.7 g/dL (ref 30.0–36.0)
MCV: 85.3 fL (ref 80.0–100.0)
Platelets: 236 10*3/uL (ref 150–400)
RBC: 3.82 MIL/uL — ABNORMAL LOW (ref 4.22–5.81)
RDW: 16 % — ABNORMAL HIGH (ref 11.5–15.5)
WBC: 8.7 10*3/uL (ref 4.0–10.5)
nRBC: 0 % (ref 0.0–0.2)

## 2023-09-15 LAB — BASIC METABOLIC PANEL
Anion gap: 9 (ref 5–15)
BUN: 55 mg/dL — ABNORMAL HIGH (ref 8–23)
CO2: 23 mmol/L (ref 22–32)
Calcium: 8.6 mg/dL — ABNORMAL LOW (ref 8.9–10.3)
Chloride: 102 mmol/L (ref 98–111)
Creatinine, Ser: 1.32 mg/dL — ABNORMAL HIGH (ref 0.61–1.24)
GFR, Estimated: 55 mL/min — ABNORMAL LOW (ref >60)
Glucose, Bld: 214 mg/dL — ABNORMAL HIGH (ref 70–99)
Potassium: 3.4 mmol/L — ABNORMAL LOW (ref 3.5–5.1)
Sodium: 134 mmol/L — ABNORMAL LOW (ref 135–145)

## 2023-09-15 LAB — MAGNESIUM: Magnesium: 2.2 mg/dL (ref 1.7–2.4)

## 2023-09-15 SURGERY — AMPUTATION, TOE
Anesthesia: General | Site: Toe | Laterality: Right

## 2023-09-15 MED ORDER — DEXMEDETOMIDINE HCL IN NACL 80 MCG/20ML IV SOLN
INTRAVENOUS | Status: DC | PRN
  Administered 2023-09-15 (×3): 12 ug via INTRAVENOUS

## 2023-09-15 MED ORDER — POTASSIUM CHLORIDE 20 MEQ PO PACK
40.0000 meq | PACK | Freq: Once | ORAL | Status: AC
  Administered 2023-09-15: 40 meq via ORAL
  Filled 2023-09-15: qty 2

## 2023-09-15 MED ORDER — GUAIFENESIN-DM 100-10 MG/5ML PO SYRP
5.0000 mL | ORAL_SOLUTION | ORAL | Status: DC | PRN
  Administered 2023-09-15: 5 mL via ORAL
  Filled 2023-09-15: qty 10

## 2023-09-15 MED ORDER — NEOMYCIN-POLYMYXIN B GU 40-200000 IR SOLN
Status: DC | PRN
  Administered 2023-09-15: 2 mL

## 2023-09-15 MED ORDER — PROPOFOL 500 MG/50ML IV EMUL
INTRAVENOUS | Status: DC | PRN
  Administered 2023-09-15: 40 mg via INTRAVENOUS
  Administered 2023-09-15: 50 mg via INTRAVENOUS
  Administered 2023-09-15 (×3): 40 mg via INTRAVENOUS

## 2023-09-15 MED ORDER — ONDANSETRON HCL 4 MG/2ML IJ SOLN: 4.0000 mg | Freq: Once | INTRAMUSCULAR | Status: DC | PRN

## 2023-09-15 MED ORDER — PHENYLEPHRINE 80 MCG/ML (10ML) SYRINGE FOR IV PUSH (FOR BLOOD PRESSURE SUPPORT)
PREFILLED_SYRINGE | INTRAVENOUS | Status: DC | PRN
  Administered 2023-09-15 (×2): 120 ug via INTRAVENOUS

## 2023-09-15 MED ORDER — MUSCLE RUB 10-15 % EX CREA
TOPICAL_CREAM | CUTANEOUS | Status: DC | PRN
  Administered 2023-09-16: 1 via TOPICAL
  Filled 2023-09-15: qty 85

## 2023-09-15 MED ORDER — LIDOCAINE 5 % EX PTCH
1.0000 | MEDICATED_PATCH | CUTANEOUS | Status: DC
  Administered 2023-09-15 – 2023-09-20 (×6): 1 via TRANSDERMAL
  Filled 2023-09-15 (×7): qty 1

## 2023-09-15 MED ORDER — PHENYLEPHRINE 80 MCG/ML (10ML) SYRINGE FOR IV PUSH (FOR BLOOD PRESSURE SUPPORT)
PREFILLED_SYRINGE | INTRAVENOUS | Status: AC
  Filled 2023-09-15: qty 10

## 2023-09-15 MED ORDER — LIDOCAINE HCL 1 % IJ SOLN
INTRAMUSCULAR | Status: DC | PRN
  Administered 2023-09-15: 20 mL

## 2023-09-15 MED ORDER — INSULIN GLARGINE-YFGN 100 UNIT/ML ~~LOC~~ SOLN
8.0000 [IU] | Freq: Every day | SUBCUTANEOUS | Status: DC
Start: 2023-09-15 — End: 2023-09-21
  Administered 2023-09-15 – 2023-09-20 (×6): 8 [IU] via SUBCUTANEOUS
  Filled 2023-09-15 (×7): qty 0.08

## 2023-09-15 MED ORDER — OXYCODONE HCL 5 MG PO TABS: 5.0000 mg | ORAL_TABLET | Freq: Once | ORAL | Status: DC | PRN

## 2023-09-15 MED ORDER — FENTANYL CITRATE (PF) 100 MCG/2ML IJ SOLN
INTRAMUSCULAR | Status: AC
  Filled 2023-09-15: qty 2

## 2023-09-15 MED ORDER — SODIUM CHLORIDE 0.9 % IV SOLN: INTRAVENOUS | Status: DC | PRN

## 2023-09-15 MED ORDER — HYDROMORPHONE HCL 2 MG PO TABS
1.0000 mg | ORAL_TABLET | Freq: Three times a day (TID) | ORAL | Status: DC | PRN
  Administered 2023-09-15 – 2023-09-19 (×4): 1 mg via ORAL
  Filled 2023-09-15 (×4): qty 1

## 2023-09-15 MED ORDER — FENTANYL CITRATE (PF) 100 MCG/2ML IJ SOLN
INTRAMUSCULAR | Status: DC | PRN
  Administered 2023-09-15: 50 ug via INTRAVENOUS

## 2023-09-15 MED ORDER — FENTANYL CITRATE (PF) 100 MCG/2ML IJ SOLN: 25.0000 ug | INTRAMUSCULAR | Status: DC | PRN

## 2023-09-15 MED ORDER — OXYCODONE HCL 5 MG/5ML PO SOLN: 5.0000 mg | Freq: Once | ORAL | Status: DC | PRN

## 2023-09-15 MED ORDER — HYDROMORPHONE HCL 2 MG PO TABS: 1.0000 mg | ORAL_TABLET | Freq: Three times a day (TID) | ORAL | Status: DC | PRN

## 2023-09-15 MED ORDER — 0.9 % SODIUM CHLORIDE (POUR BTL) OPTIME
TOPICAL | Status: DC | PRN
  Administered 2023-09-15: 500 mL

## 2023-09-15 SURGICAL SUPPLY — 46 items
BLADE MED AGGRESSIVE (BLADE) ×2 IMPLANT
BLADE OSC/SAGITTAL MD 5.5X18 (BLADE) IMPLANT
BLADE SURG 15 STRL LF DISP TIS (BLADE) IMPLANT
BLADE SURG MINI STRL (BLADE) IMPLANT
BNDG ELASTIC 4INX 5YD STR LF (GAUZE/BANDAGES/DRESSINGS) ×2 IMPLANT
BNDG ESMARCH 4X12 STRL LF (GAUZE/BANDAGES/DRESSINGS) ×2 IMPLANT
BNDG GAUZE DERMACEA FLUFF 4 (GAUZE/BANDAGES/DRESSINGS) ×2 IMPLANT
BNDG STRETCH 4X75 STRL LF (GAUZE/BANDAGES/DRESSINGS) IMPLANT
CNTNR URN SCR LID CUP LEK RST (MISCELLANEOUS) ×2 IMPLANT
CUFF TOURN SGL QUICK 12 (TOURNIQUET CUFF) IMPLANT
CUFF TOURN SGL QUICK 18X4 (TOURNIQUET CUFF) IMPLANT
DRAPE BILAT LIMB 76X120 89291 (MISCELLANEOUS) IMPLANT
DRAPE FLUOR MINI C-ARM 54X84 (DRAPES) IMPLANT
DRSG ADAPTIC 3X8 NADH LF (GAUZE/BANDAGES/DRESSINGS) IMPLANT
DURAPREP 26ML APPLICATOR (WOUND CARE) ×2 IMPLANT
ELECT REM PT RETURN 9FT ADLT (ELECTROSURGICAL) ×2
ELECTRODE REM PT RTRN 9FT ADLT (ELECTROSURGICAL) ×2 IMPLANT
GAUZE SPONGE 4X4 12PLY STRL (GAUZE/BANDAGES/DRESSINGS) ×4 IMPLANT
GAUZE STRETCH 2X75IN STRL (MISCELLANEOUS) IMPLANT
GAUZE XEROFORM 1X8 LF (GAUZE/BANDAGES/DRESSINGS) ×2 IMPLANT
GLOVE BIO SURGEON STRL SZ7.5 (GLOVE) ×4 IMPLANT
GOWN STRL REUS W/ TWL LRG LVL3 (GOWN DISPOSABLE) ×4 IMPLANT
HANDPIECE VERSAJET DEBRIDEMENT (MISCELLANEOUS) IMPLANT
IV NS IRRIG 3000ML ARTHROMATIC (IV SOLUTION) IMPLANT
KIT TURNOVER KIT A (KITS) ×2 IMPLANT
LABEL OR SOLS (LABEL) ×2 IMPLANT
MANIFOLD NEPTUNE II (INSTRUMENTS) ×2 IMPLANT
MATRIX WOUND 3-LAYER 5X5 (Tissue) IMPLANT
MICROMATRIX 500MG (Tissue) ×2 IMPLANT
NDL FILTER BLUNT 18X1 1/2 (NEEDLE) ×2 IMPLANT
NDL HYPO 25X1 1.5 SAFETY (NEEDLE) ×4 IMPLANT
NEEDLE FILTER BLUNT 18X1 1/2 (NEEDLE) ×2 IMPLANT
NEEDLE HYPO 25X1 1.5 SAFETY (NEEDLE) ×4 IMPLANT
NS IRRIG 500ML POUR BTL (IV SOLUTION) ×2 IMPLANT
PACK EXTREMITY ARMC (MISCELLANEOUS) ×2 IMPLANT
SOL PREP PVP 2OZ (MISCELLANEOUS) ×2
SOLUTION PARTIC MCRMTRX 500MG (Tissue) IMPLANT
SOLUTION PREP PVP 2OZ (MISCELLANEOUS) ×2 IMPLANT
STOCKINETTE STRL 6IN 960660 (GAUZE/BANDAGES/DRESSINGS) ×2 IMPLANT
SUT ETHILON 3-0 FS-10 30 BLK (SUTURE) ×4
SUT VIC AB 3-0 SH 27X BRD (SUTURE) ×2 IMPLANT
SUTURE EHLN 3-0 FS-10 30 BLK (SUTURE) ×4 IMPLANT
SWAB CULTURE AMIES ANAERIB BLU (MISCELLANEOUS) IMPLANT
SYR 10ML LL (SYRINGE) ×2 IMPLANT
TRAP FLUID SMOKE EVACUATOR (MISCELLANEOUS) ×2 IMPLANT
WATER STERILE IRR 500ML POUR (IV SOLUTION) ×2 IMPLANT

## 2023-09-15 NOTE — Op Note (Signed)
OPERATIVE REPORT Patient name: Tyler [name] MRN: [id number] DOB: Oct 20, 1945  DOS:  09/15/23  Preop Dx: Acute on chronic congestive heart failure, unspecified heart failure type (HCC) Postop Dx: same  Procedure:  1. Right great toe amputation  2. Left foot wound irrigation and debridement with application of wound graft.   Surgeon: Louann Sjogren, [name]  Anesthesia: 1% lidocaine plain  totaling  10 cc infiltrated in eache of the patient's left and right  lower extremity  Hemostasis: No TQ necessary   EBL: <10  mL Materials: Acell micromatrix 500 mg powder and 5x5 multiplayer wound matrix applied with about 1 cm x 1 cm  wasted  Injectables: as above Pathology: Right great toe for pathology, right great toe wound for culture, residual right foot bone for culture  Condition: The patient tolerated the procedure and anesthesia well. No complications noted or reported   Justification for procedure: The patient is a 77 y.o. male  who presents today for surgical treatment of right great toe osteomyelitis and left foot wound.  All conservative modalities of been unsuccessful in providing any sort of satisfactory alleviation of symptoms with the patient. The patient was told benefits as well as possible side effects of the surgery. The patient consented for surgical correction. The patient consent form was reviewed. All patient questions were answered. No guarantees were expressed or implied. The patient and the surgeon both signed the patient consent form with the witness present and placed in the patient's chart.   Procedure in Detail: The patient was brought to the operating room, placed in the operating table in the supine position at which time an aseptic scrub and drape were performed about the patient's respective lower extremity after anesthesia was induced as described above. Attention was then directed to the surgical area where procedure number one commenced.  Procedure #1:   Attention was directed to the right great toe digit were an incision was preformed encircling the base of the toe. Incision was made down to bone and digit was amputated at the level of the metatarsophalangeal join. After the toe was disarticulated it was passed off to the back table to be sent to pathology for further evaluation. A wound swab was taken of the wound for culture.  The extensor and flexor tendons were identified and resected as far proximally as possible. Any necrotic tissue was removed to healthy bleeding tissue. The metatarsal head was identified and noted to be hard. The area was irrigated copiously sterile saline and residual bone was sent to microbiology. Any bleeders noted were cauterized as necessary. The skin was re-approximated utilizing 3-0 nylon suture.   Procedure #2 Attention was then directed to the plantar left foot in the area of plantar wound. The wound was debrided with scalpel and rongeue and measured 2.5 cm x 3 cm .   The area was irrigated copiously. A cell micromatrix skin substitute was then applied to the plantar wound. About 1 cm x 1 cm  was wasted.    Dry sterile compressive dressings were then applied to all previously mentioned incision sites about the patient's lower extremity. The patient was then transferred from the operating room to the recovery room having tolerated the procedure and anesthesia well. All vital signs are stable. After a brief stay in the recovery room the patient was readmitted to the floor.    Disposition:  Patient is ok for discharge from podiatry standpoint. Will keep dressings clean dry and intact until follow-up in about 1 weeks.  Will need two weeks or oral antibiotics on discharge. May be heel weight bearing on the right foot and partial minimal weight bearing on the left with most weight toward to heel.    Louann Sjogren, [name] [facility]  Dr. Louann Sjogren, [name]    [address] [place], [place] [address]                Office [phone]  Fax [phone]

## 2023-09-15 NOTE — Brief Op Note (Signed)
09/11/2023 - 09/15/2023  8:43 AM  PATIENT:  Tyler [name]  77 y.o. male  PRE-OPERATIVE DIAGNOSIS:  Osteomeylitis right foot, ulcer left foot  POST-OPERATIVE DIAGNOSIS:  Osteomeylitis right foot, ulcer left foot  PROCEDURE:  Procedure(s) with comments: AMPUTATION TOE (Right) - Right hallux amputation IRRIGATION AND DEBRIDEMENT WOUND (Left) - Left foot wound debridement, possible graft/biopsy  SURGEON:  Surgeons and Role:    Louann Sjogren, [name] - Primary  PHYSICIAN ASSISTANT:   ASSISTANTS: none   ANESTHESIA:   local and MAC  EBL:  <10 ml   BLOOD ADMINISTERED:none  DRAINS: none   LOCAL MEDICATIONS USED:  LIDOCAINE  and Amount: 20 ml  SPECIMEN:  Source of Specimen:  Right great toe for pathology , right wound swab for culture, right residual bone for culture   DISPOSITION OF SPECIMEN:   as above  COUNTS:  YES  TOURNIQUET:  * Missing tourniquet times found for documented tourniquets in log: [id number] *  DICTATION: .Note written in EPIC  PLAN OF CARE: Admit to inpatient   PATIENT DISPOSITION:  PACU - hemodynamically stable.   Delay start of Pharmacological VTE agent (>24hrs) due to surgical blood loss or risk of bleeding: no

## 2023-09-15 NOTE — Progress Notes (Signed)
Progress Note   Patient: Tyler [name] Nie WUX:[id number] DOB: 30-Jan-1946 DOA: 09/11/2023     4 DOS: the patient was seen and examined on 09/15/2023   Brief hospital course: Lenox Quist Aziz is a 77 y.o. 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  He presented to the hospital because of cough productive of yellowish phlegm, increasing shortness of breath, orthopnea, easy fatigability, about 10 pound weight gain in a week.  He was hospitalized on 08/13/2023 for CHF exacerbation.  His diuretic regimen had been adjusted in the outpatient setting but his symptoms did not improve. He also complained of oozing from the wounds on the bottom of his feet.   He was admitted to the hospital for acute exacerbation of chronic systolic CHF, community-acquired pneumonia, infected plantar ulcers on bilateral feet and right foot osteomyelitis.  Dr. Annamary Rummage, podiatrist, was consulted for right foot osteomyelitis.  He recommended MRI of the foot for further evaluation.  MRI of the left foot showed plantar surface ulcer without evidence of osteomyelitis or abscess.  MRI of the right foot showed septic arthritis of right hallux IPJ, and OM of the proximal and distal phalanx.  Podiatry was consulted who recommended right hallux amputation versus ray resection and left foot ulcer debridement.  Patient was also seen by vascular surgery who performed right lower extremity arteriogram.  Patient underwent angioplasty and stent placement of right SFA and popliteal arteries 12/20.  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  S/p R great toe amp and L foot wound I&D w/ wound graft with podiatry 12/22 -Continue IV antibiotics, vancomycin and ceftriaxone pending cultures from OR    Acute on chronic heart failure with reduced ejection fraction (HFrEF,30-35 %) (HCC) Nonischemic cardiomyopathy Bilateral pleural effusions Moderate mitral regurgitation Patient failing outpatient management Cardiology consulted appreciate recommendations Last echo from October with EF 30 to 35% Diuresis with IV Bumex, 1.9 L of urine output, weight stable this a.m. Patient previously on Entresto but did not tolerate it (per review of heart failure clinic note earlier in the day) Continue GDMT with metoprolol and Kerendia Daily weights with intake and output monitoring    ?CAP (community acquired pneumonia) Patient with several week history of productive cough Patient with low-grade temp of 99 leukocytosis of 11,000 chest x-ray showing right upper lobe opacity suspicious for pneumonia Received Rocephin and azithromycin in the ED Receiving Rocephin and vancomycin for osteomyelitis Pro-Calc 0.12 Respiratory viral panel negative  -Continue antibiotics per above   H/o Persistent atrial fibrillation (HCC) Continue metoprolol Patient off of Eliquis due to hematuria. (per review of heart failure clinic note from earlier in the day) Patient has been previously referred for ablation Continue aspirin  Complete heart block (HCC)   s/p PACEMAKER In 2021. No acute issues  Uncontrolled type 2 diabetes mellitus with hyperglycemia, without long-term current use of insulin (HCC) A1c 7.2 2 months ago.  -Increase Semglee 8 units nightly -Continue sliding scale insulin coverage.  Hold home metformin and glipizide  HTN (hypertension) BP controlled Continue metoprolol  Chronic kidney disease, stage 3a (HCC) Renal function at baseline  Anemia  due to  chronic kidney disease Hemoglobin at baseline     Latest Ref Rng & Units 09/15/2023    2:35 AM 09/14/2023    2:58 AM 09/12/2023    3:40 AM  CBC  WBC 4.0 - 10.5 K/uL 8.7  9.8  9.9   Hemoglobin 13.0 - 17.0 g/dL 81.1  91.4  9.4   Hematocrit 39.0 - 52.0 % 32.6  33.4  30.9   Platelets 150 - 400 K/uL 236  267  234         Subjective: Breathing is improved, cough is also improved.  Having some left shoulder discomfort after the OR.  Physical Exam: Vitals:   09/15/23 1015 09/15/23 1030 09/15/23 1045 09/15/23 1200  BP: 100/62 (!) 115/58 (!) 123/57   Pulse: 76 79 77   Resp: (!) 21 (!) 22 19   Temp:    97.7 F (36.5 C)  TempSrc:    Oral  SpO2: 96% 98% 97%   Weight:      Height:        Physical Exam  Constitutional: In no distress.  Cardiovascular: Normal rate, regular rhythm. 2+ edema of BLE, improved this AM  Pulmonary: Non labored breathing on room air, no wheezing or rales.  Abdominal: Soft. Normal bowel sounds. Non distended and non tender Musculoskeletal: Normal range of motion.     Neurological: Alert and oriented to person, place, and time. Non focal  Skin: Skin is warm and dry.   Data Reviewed:  I have reviewed all labs and tests     Latest Ref Rng & Units 09/15/2023    2:35 AM 09/14/2023    2:58 AM 09/13/2023    4:28 AM  BMP  Glucose 70 - 99 mg/dL 782  956  213   BUN 8 - 23 mg/dL 55  52  58   Creatinine 0.61 - 1.24 mg/dL 0.86  5.78  4.69   Sodium 135 - 145 mmol/L 134  137  136   Potassium 3.5 - 5.1 mmol/L 3.4  3.5  3.8   Chloride 98 - 111 mmol/L 102  104  103   CO2 22 - 32 mmol/L 23  23  20    Calcium 8.9 - 10.3 mg/dL 8.6  8.8  8.5       Latest Ref Rng & Units 09/15/2023    2:35 AM 09/14/2023    2:58 AM 09/12/2023    3:40 AM  CBC  WBC 4.0 - 10.5 K/uL 8.7  9.8  9.9   Hemoglobin 13.0 - 17.0 g/dL 62.9  52.8  9.4   Hematocrit 39.0 - 52.0 % 32.6  33.4  30.9   Platelets 150 - 400 K/uL 236  267  234      Family Communication: Daughter is at  bedside  Disposition: Status is: Inpatient Remains inpatient appropriate because: R great toe septic arthritis, OR and antibiotics, HF exacerbation   Planned Discharge Destination:  Pending     Time spent: 35 minutes  Author: Marolyn Haller, MD 09/15/2023 5:55 PM  For on call review [email or web address].

## 2023-09-15 NOTE — Progress Notes (Signed)
[facility] Cardiology  CARDIOLOGY PROGRESS NOTE  Patient ID: Tyler [name] MRN: [id number] DOB/AGE: 1945-09-30 77 y.o.  Admit date: 09/11/2023 Referring Physician Dr. Montez Morita Reason for Consultation heart failure  HPI: Patient presented to hospital with worsening shortness of breath, pedal edema, being managed for decompensated heart failure with reduced EF.  Also having foot issues.  Diuresed well in last 24 hours with increased Bumex dose.  Feels breathing is better.  Review of systems complete and found to be negative unless listed above     Past Medical History:  Diagnosis Date   (HFpEF) heart failure with preserved ejection fraction (HCC) 03/01/2020   a.) TTE 03/01/2020: EF 55-60%, mod MAC, mod AoV sclerosis, triv AR, mild TR, mod MR, RVSP 50-59; b.) TTE 09/10/2020: EF 55-60%, mod MAC, mod AoV sclerosis, mild TR, 3+ MR, RVSP 50-59; c.) TTE 09/26/2020: EF 55-60%, mild LA dil, triv PR, mild MR/TR, RVSP 37-49   Adenoma of left adrenal gland    Anemia    Arthritis    Atrial fibrillation and flutter (HCC)    a.) CHA2DS2VASc = 5 (age x2, HFpEF, HTN, T2DM);  b.) s/p CTI ablation 09/07/2020; c.) rate/rhythm maintained on oral carvedilol; not on chronic anticoagulation therapy   CAD (coronary artery disease)    Cardiomegaly    CKD (chronic kidney disease), stage III (HCC)    DOE (dyspnea on exertion)    Drug-induced bradycardia    Gangrene of toe of left foot (HCC)    a.) 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    HLD (hyperlipidemia)    Hypertension    Long term current use of aspirin    Lymphedema of both lower extremities    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)    a.) s/p amputation 11/04/2022   Peripheral vascular disease (HCC)    Pleural effusion on right 09/09/2020   a.) s/p RIGHT thoracentesis with 2180 cc yield   Pneumonia    Presence of permanent cardiac pacemaker 09/10/2020   a.) TVP placement  09/10/2020 due to intermittent CHB in setting of urosepsis; b.) s/p PPM placement 09/15/2020: MDT Azure XT DR (SN: [id number])   Pulmonary hypertension (HCC) 03/01/2020   a.) TTE: 03/01/2020: RVSP 50-59; b.) TTE 09/10/2020: RVSP 50-59; c.) TTE 09/26/2020: RVSP 37-49   RA (rheumatoid arthritis) (HCC)    Rheumatic fever    Sepsis (HCC) 09/10/2020   a.) urosepsis --> BC x 2 sets and UC all grew out significant Proteus mirabilis; admitted to [facility] 09/07/2020 - 09/27/2020.   Sick sinus syndrome [facility])    a.) s/p MDT PPM placement 09/15/2020   T2DM (type 2 diabetes mellitus) (HCC)    Urinary retention    chronic, with indwelling Foley catheter and plans for a suprapubic   Wears dentures    full upper    Past Surgical History:  Procedure Laterality Date   AMPUTATION TOE Right 11/04/2022   Procedure: AMPUTATION TOE;  Surgeon: Felecia Shelling, [name];  [name];  Service: Podiatry;  Laterality: Right;   BONE BIOPSY Right 04/05/2023   Procedure: BONE BIOPSY THIRD & FOURTH;  Surgeon: Felecia Shelling, [name];  [name];  Service: Podiatry;  Laterality: Right;   CARDIAC ELECTROPHYSIOLOGY STUDY AND ABLATION N/A 09/07/2020   Procedure: CARDIAC EP STUDY AND ABLATION (CTI)   CATARACT EXTRACTION W/PHACO Right 01/16/2017   Procedure: CATARACT EXTRACTION PHACO AND INTRAOCULAR LENS PLACEMENT (IOC)  Right  Complicated;  Surgeon: Lockie Mola, MD;  Location: [facility];  Service: Ophthalmology;  Laterality: Right;  IVA Block Healon 5 malyugin vision blue Diabetic - oral meds   CATARACT EXTRACTION W/PHACO Left 10/23/2022   Procedure: CATARACT EXTRACTION PHACO AND INTRAOCULAR LENS PLACEMENT (IOC) LEFT DIABETIC;  Surgeon: Galen Manila, MD;  Location: [facility];  Service: Ophthalmology;  Laterality: Left;  Diabetic   COLONOSCOPY     LOWER EXTREMITY ANGIOGRAPHY Right 11/02/2022   Procedure: Lower Extremity Angiography;  Surgeon: Annice Needy, MD;  Location:  [facility];  Service: Cardiovascular;  Laterality: Right;   METATARSAL HEAD EXCISION Left 07/05/2018   Procedure: RESECTION FIRST METATARSAL INFECTED BONE AND SOFT TISSUE;  Surgeon: Recardo Evangelist, [name];  [name];  Service: Podiatry;  Laterality: Left;   METATARSAL HEAD EXCISION Right 04/05/2023   Procedure: METATARSAL HEAD EXCISION THIRD & FOURTH;  Surgeon: Felecia Shelling, [name];  [name];  Service: Podiatry;  Laterality: Right;   PACEMAKER INSERTION  09/15/2020   TOE AMPUTATION Left    TONSILLECTOMY      Medications Prior to Admission  Medication Sig Dispense Refill Last Dose/Taking   acetaminophen (TYLENOL) 500 MG tablet Take 2 tablets (1,000 mg total) by mouth 3 (three) times daily as needed (for pain).   Taking As Needed   aspirin EC 81 MG tablet Take 1 tablet (81 mg total) by mouth daily. Swallow whole. 30 tablet 0 09/11/2023   bumetanide (BUMEX) 2 MG tablet Take 1 tablet (2 mg total) by mouth daily. 90 tablet 3 09/11/2023   Finerenone (KERENDIA) 10 MG TABS Take 1 tablet (10 mg total) by mouth daily. 30 tablet 2 09/11/2023 Morning   gentamicin cream (GARAMYCIN) 0.1 % Apply 1 Application topically 2 (two) times daily. 30 g 1 09/11/2023 Morning   glipiZIDE (GLUCOTROL) 10 MG tablet Take 1 tablet (10 mg total) by mouth 2 (two) times daily. 60 tablet 2 09/11/2023 Morning   metFORMIN (GLUCOPHAGE) 1000 MG tablet Take 1 tablet (1,000 mg total) by mouth 2 (two) times daily. 60 tablet 2 09/11/2023 Morning   metoprolol succinate (TOPROL-XL) 50 MG 24 hr tablet Take 1 tablet (50 mg total) by mouth daily. Take with or immediately following a meal. 30 tablet 2 09/11/2023   Multiple Vitamins-Minerals (MULTIVITAMIN WITH MINERALS) tablet Take 1 tablet by mouth daily.   09/11/2023   silver sulfADIAZINE (SILVADENE) 1 % cream Apply to affected area daily 50 g 1 09/11/2023   Social History   Socioeconomic History   Marital status: Single    Spouse name: Not on file   Number  of children: Not on file   Years of education: Not on file   Highest education level: Not on file  Occupational History    Comment: [employer]  Tobacco Use   Smoking status: Never   Smokeless tobacco: Never  Vaping Use   Vaping status: Never Used  Substance and Sexual Activity   Alcohol use: Not Currently    Comment: rarely   Drug use: Never   Sexual activity: Not Currently    Birth control/protection: None  Other Topics Concern   Not on file  Social History Narrative   Lives with roommate   Social Drivers of Health   Financial Resource Strain: Low Risk  (03/20/2023)   Received from [facility], [facility]   Overall Financial Resource Strain (CARDIA)    Difficulty of Paying Living Expenses: Not hard at all  Food Insecurity: No Food Insecurity (  09/13/2023)   Hunger Vital Sign    Worried About Running Out of Food in the Last Year: Never true    Ran Out of Food in the Last Year: Never true  Transportation Needs: Patient Declined (09/14/2023)   PRAPARE - Transportation    Lack of Transportation (Medical): Patient declined    Lack of Transportation (Non-Medical): Patient declined  Physical Activity: Not on file  Stress: No Stress Concern Present (09/07/2020)   Received from [facility], [facility]   [employer] of Occupational Health - Occupational Stress Questionnaire    Feeling of Stress : Not at all  Social Connections: Unknown (02/01/2022)   Received from [facility], [facility]   Social Network    Social Network: Not on file  Intimate Partner Violence: Patient Declined (09/14/2023)   Humiliation, Afraid, Rape, and Kick questionnaire    Fear of Current or Ex-Partner: Patient declined    Emotionally Abused: Patient declined    Physically Abused: Patient declined    Sexually Abused: Patient declined    Family History  Problem Relation Age of Onset   Cancer Niece       Review of systems complete and found to be negative unless listed  above      PHYSICAL EXAM  Alert and oriented x 3 Bilateral +2 pedal edema Systolic murmur left lower sternal border No wheeze  Labs:   Lab Results  Component Value Date   WBC 8.7 09/15/2023   HGB 10.0 (L) 09/15/2023   HCT 32.6 (L) 09/15/2023   MCV 85.3 09/15/2023   PLT 236 09/15/2023    Recent Labs  Lab 09/15/23 0235  NA 134*  K 3.4*  CL 102  CO2 23  BUN 55*  CREATININE 1.32*  CALCIUM 8.6*  GLUCOSE 214*   Lab Results  Component Value Date   TROPONINI 0.05 (HH) 07/03/2018    Lab Results  Component Value Date   CHOL 148 07/03/2023   CHOL 102 07/07/2014   Lab Results  Component Value Date   HDL 31 (L) 07/03/2023   HDL 25 (L) 07/07/2014   Lab Results  Component Value Date   LDLCALC 95 07/03/2023   LDLCALC 55 07/07/2014   Lab Results  Component Value Date   TRIG 111 07/03/2023   TRIG 112 07/07/2014   Lab Results  Component Value Date   CHOLHDL 4.8 07/03/2023   No results found for: "LDLDIRECT"    Radiology: PERIPHERAL VASCULAR CATHETERIZATION Result Date: 09/13/2023 See surgical note for result.  MR FOOT LEFT WO CONTRAST Result Date: 09/12/2023 CLINICAL DATA:  Plantar foot ulcer. EXAM: MRI OF THE LEFT FOOT WITHOUT CONTRAST TECHNIQUE: Multiplanar, multisequence MR imaging of the left forefoot was performed. No intravenous contrast was administered. COMPARISON:  Left foot x-rays from yesterday. CT left foot dated July 06, 2023. FINDINGS: Bones/Joint/Cartilage No suspicious marrow signal abnormality. Subtle periarticular marrow edema along the plantar aspect of the first TMT joint is also seen at the dorsal aspect of the joint and is most likely degenerative. 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 No fracture or dislocation. 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 Mild midfoot degenerative changes. No joint effusion. Ligaments Collateral  ligaments are intact. Muscles and Tendons No tenosynovitis. Complete fatty atrophy of the intrinsic foot muscles. Soft tissue Ulceration along the medial plantar aspect of the midfoot near the base of the medial cuneiform. Diffuse soft tissue swelling. No fluid collection. No soft tissue  mass. IMPRESSION: 1. Skin ulceration along the medial plantar aspect of the midfoot near the base of the medial cuneiform. No evidence of osteomyelitis or abscess. Electronically Signed   By: [name] M.D.   On: 09/12/2023 18:01   MR FOOT RIGHT WO CONTRAST Result Date: 09/12/2023 CLINICAL DATA:  Right foot ulcer. EXAM: MRI OF THE RIGHT FOREFOOT WITHOUT CONTRAST TECHNIQUE: Multiplanar, multisequence MR imaging of the right forefoot was performed. No intravenous contrast was administered. COMPARISON:  Right foot x-rays from yesterday. CT right foot dated July 06, 2023. FINDINGS: Bones/Joint/Cartilage Abnormal marrow edema involving the first proximal phalanx head and majority of the first distal phalanx. First IP joint effusion. Prior third ray distal fourth metatarsal amputations. No fracture or dislocation. Unchanged severe first MTP joint osteoarthritis. No joint effusion. Ligaments Collateral ligaments are intact. Muscles and Tendons No tenosynovitis. Near complete fatty atrophy of the intrinsic muscles. Soft tissue Deep ulceration along the medial plantar aspect of the great toe with sinus tract extending to the IP joint. Diffuse soft tissue swelling without focal fluid collection. Small ganglion cyst along the plantar aspect of the second metatarsal head. IMPRESSION: 1. Deep ulceration along the medial plantar aspect of the great toe with sinus tract extending to the IP joint. Associated septic arthritis and osteomyelitis of the first proximal and distal phalanges. No abscess. Electronically Signed   By: [name] M.D.   On: 09/12/2023 17:56   DG Foot 2 Views Right Result Date: 09/11/2023 CLINICAL DATA:   Diabetic foot ulcer EXAM: RIGHT FOOT - 2 VIEW COMPARISON:  None Available. FINDINGS: Transmetatarsal amputation of the right third toe and resection of the right fourth metatarsal head. Large ulcer is seen within the medial and plantar right great toe which extends to the cortex of the base of the distal phalanx with osseous erosion in this location in keeping with changes of osteomyelitis. There is extensive superimposed soft tissue swelling right great toe and second toe as well as moderate soft tissue swelling of the right forefoot diffusely. Vascular calcifications are noted. Advanced degenerative arthritis noted of the first metatarsophalangeal joint. No acute fracture or dislocation. IMPRESSION: 1. Large ulcer within the medial and plantar right great toe which extends to the cortex of the base of the distal phalanx with osseous erosion in this location in keeping with changes of osteomyelitis. 2. Transmetatarsal amputation of the right third toe and resection of the right fourth metatarsal head. Electronically Signed   By: Helyn Numbers M.D.   On: 09/11/2023 22:48   DG Foot 2 Views Left Result Date: 09/11/2023 CLINICAL DATA:  Diabetic ulcer EXAM: LEFT FOOT - 2 VIEW COMPARISON:  None Available. FINDINGS: No acute fracture or dislocation. 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 There is a soft tissue wound within the soft tissues distal to the residual first metatarsal. No definite superimposed osseous erosion. There is diffuse soft tissue swelling of the second and third digits. Posttraumatic changes noted involving the second metatarsal head. Moderate midfoot and tibiotalar degenerative arthritis. Vascular calcifications are noted. IMPRESSION: 1. 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 Soft tissue wound within the soft tissues distal to the residual first metatarsal. No definite superimposed osseous erosion. 2. Diffuse soft tissue swelling of the second and third digits.  Electronically Signed   By: Helyn Numbers M.D.   On: 09/11/2023 22:46   DG Chest 2 View Result Date: 09/11/2023 CLINICAL DATA:  Shortness of breath EXAM: CHEST - 2 VIEW COMPARISON:  08/13/2023 FINDINGS:  Small bilateral pleural effusions. No frank interstitial edema. Mild vague right upper lobe opacity, suspicious for pneumonia. No pleural effusion or pneumothorax. Cardiomegaly.  Left subclavian pacemaker. Degenerative changes of the visualized thoracolumbar spine. IMPRESSION: Mild vague right upper lobe opacity, suspicious for pneumonia. Small bilateral pleural effusions. No frank interstitial edema. Electronically Signed   By: Charline Bills M.D.   On: 09/11/2023 18:09    ASSESSMENT AND PLAN:  Acute on chronic heart failure with reduced EF, LVEF 30 to 35% Acute hypoxic respiratory failure, improving Intermittent complete heart block with Medtronic dual-chamber pacemaker 08/2020 Atrial flutter with prior ablation, paroxysmal atrial fibrillation CKD stage III A Toe osteomyelitis  Diuresing well with current Bumex dose with stable renal function.  Continue current IV diuresis.  Aggressive potassium replacement to keep potassium greater than 4 Continue Toprol-XL, finerenone and rest of medical therapy.  He did not tolerate Entresto as outpatient, will consider low-dose losartan prior to discharge. Patient educated about importance of outpatient follow-up to avoid recurrent heart failure admissions  Signed: Kathryne Gin MD 09/15/2023, 12:40 PM

## 2023-09-15 NOTE — Anesthesia Postprocedure Evaluation (Signed)
Anesthesia Post Note  Patient: Tyler [name]  Procedure(s) Performed: AMPUTATION TOE (Right: Toe) IRRIGATION AND DEBRIDEMENT WOUND (Left: Foot)  Patient location during evaluation: PACU Anesthesia Type: General Level of consciousness: awake and alert Pain management: pain level controlled Vital Signs Assessment: post-procedure vital signs reviewed and stable Respiratory status: spontaneous breathing, nonlabored ventilation, respiratory function stable and patient connected to nasal cannula oxygen Cardiovascular status: blood pressure returned to baseline and stable Postop Assessment: no apparent nausea or vomiting Anesthetic complications: no   No notable events documented.   Last Vitals:  Vitals:   09/15/23 1030 09/15/23 1045  BP: (!) 115/58 (!) 123/57  Pulse: 79 77  Resp: (!) 22 19  Temp:    SpO2: 98% 97%    Last Pain:  Vitals:   09/15/23 1045  TempSrc:   PainSc: Asleep                 Corinda Gubler

## 2023-09-15 NOTE — Anesthesia Preprocedure Evaluation (Signed)
Anesthesia Evaluation  Patient identified by MRN, date of birth, ID band Patient awake    Reviewed: Allergy & Precautions, NPO status , Patient's Chart, lab work & pertinent test results  History of Anesthesia Complications Negative for: history of anesthetic complications  Airway Mallampati: IV  TM Distance: >3 FB Neck ROM: Full    Dental  (+) Edentulous Upper, Poor Dentition, Chipped   Pulmonary neg sleep apnea, pneumonia, unresolved, neg COPD, Patient abstained from smoking.Not current smoker  Productive cough for two weeks. Potential community acquired pneumonia, on antibiotics now inpatient. Occasional cough with me in preop. 100% SpO2 on room air. No respiratory distress   + rhonchi  + decreased breath sounds      Cardiovascular Exercise Tolerance: Poor METShypertension, Pt. on medications + CAD, + Peripheral Vascular Disease, +CHF and + DOE  (-) Past MI + dysrhythmias + pacemaker + Valvular Problems/Murmurs MR  Rhythm:Regular Rate:Normal - Systolic murmurs Admitted for CHF exacerbation. Since has been diuresed, net -3.4L. Patient says his breathing feels better compared to admission.  TTE 2024: 1. Left ventricular ejection fraction, by estimation, is 30 to 35%. The  left ventricle has moderately decreased function. The left ventricle  demonstrates regional wall motion abnormalities (see scoring  diagram/findings for description). Indeterminate  diastolic filling due to E-A fusion.   2. Right ventricular systolic function is normal. The right ventricular  size is normal.   3. The mitral valve is normal in structure. Moderate mitral valve  regurgitation. No evidence of mitral stenosis.   4. The aortic valve is normal in structure. Aortic valve regurgitation is  not visualized. No aortic stenosis is present.   5. The inferior vena cava is normal in size with greater than 50%  respiratory variability, suggesting right  atrial pressure of 3 mmHg.     Neuro/Psych negative neurological ROS  negative psych ROS   GI/Hepatic ,neg GERD  ,,(+)     (-) substance abuse    Endo/Other  diabetes    Renal/GU CRFRenal disease     Musculoskeletal   Abdominal   Peds  Hematology  (+) Blood dyscrasia, anemia   Anesthesia Other Findings Past Medical History: 03/01/2020: (HFpEF) heart failure with preserved ejection fraction  (HCC)     Comment:  a.) TTE 03/01/2020: EF 55-60%, mod MAC, mod AoV               sclerosis, triv AR, mild TR, mod MR, RVSP 50-59; b.) TTE               09/10/2020: EF 55-60%, mod MAC, mod AoV sclerosis, mild               TR, 3+ MR, RVSP 50-59; c.) TTE 09/26/2020: EF 55-60%,               mild LA dil, triv PR, mild MR/TR, RVSP 37-49 No date: Adenoma of left adrenal gland No date: Anemia No date: Arthritis No date: Atrial fibrillation and flutter (HCC)     Comment:  a.) CHA2DS2VASc = 5 (age x2, HFpEF, HTN, T2DM);  b.) s/p              CTI ablation 09/07/2020; c.) rate/rhythm maintained on               oral carvedilol; not on chronic anticoagulation therapy No date: CAD (coronary artery disease) No date: Cardiomegaly No date: CKD (chronic kidney disease), stage III (HCC) No date: DOE (dyspnea on exertion) No date: Drug-induced bradycardia No  date: Gangrene of toe of left foot (HCC)     Comment:  a.) 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 No date: Hepatosplenomegaly No date: History of bilateral cataract extraction No date: HLD (hyperlipidemia) No date: Hypertension No date: Long term current use of aspirin No date: Lymphedema of both lower extremities No date: 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)     Comment:  a.) s/p amputation 11/04/2022 No date: Peripheral vascular disease (HCC) 09/09/2020: Pleural effusion on right     Comment:  a.) s/p RIGHT thoracentesis with 2180 cc yield No date: Pneumonia 09/10/2020: Presence of permanent cardiac pacemaker      Comment:  a.) TVP placement 09/10/2020 due to intermittent CHB in               setting of urosepsis; b.) s/p PPM placement 09/15/2020:               MDT Azure XT DR (SN: [id number]) 03/01/2020: Pulmonary hypertension (HCC)     Comment:  a.) TTE: 03/01/2020: RVSP 50-59; b.) TTE 09/10/2020:               RVSP 50-59; c.) TTE 09/26/2020: RVSP 37-49 No date: RA (rheumatoid arthritis) (HCC) No date: Rheumatic fever 09/10/2020: Sepsis (HCC)     Comment:  a.) urosepsis --> BC x 2 sets and UC all grew out               significant Proteus mirabilis; admitted to [facility] 09/07/2020 - 09/27/2020. No date: Sick sinus syndrome [facility])     Comment:  a.) s/p MDT PPM placement 09/15/2020 No date: T2DM (type 2 diabetes mellitus) (HCC) No date: Urinary retention     Comment:  chronic, with indwelling Foley catheter and plans for a               suprapubic No date: Wears dentures     Comment:  full upper  Reproductive/Obstetrics                             Anesthesia Physical Anesthesia Plan  ASA: 4  Anesthesia Plan: General   Post-op Pain Management: Minimal or no pain anticipated   Induction: Intravenous  PONV Risk Score and Plan: 2 and Propofol infusion, TIVA, Ondansetron and Treatment may vary due to age or medical condition  Airway Management Planned: Nasal Cannula and Natural Airway  Additional Equipment: None  Intra-op Plan:   Post-operative Plan:   Informed Consent: I have reviewed the patients History and Physical, chart, labs and discussed the procedure including the risks, benefits and alternatives for the proposed anesthesia with the patient or authorized representative who has indicated his/her understanding and acceptance.     Dental advisory given  Plan Discussed with: CRNA and Surgeon  Anesthesia Plan Comments: (Discussed risks of anesthesia with patient, including possibility of difficulty with spontaneous ventilation  under anesthesia necessitating airway intervention, PONV, and rare risks such as cardiac or respiratory or neurological events, and allergic reactions. Discussed the role of CRNA in patient's perioperative care. Patient understands. Patient counseled on being higher risk for anesthesia due to comorbidities: active pneumonia. Patient was told about increased risk of cardiac and respiratory events, including death. )       Anesthesia Quick Evaluation

## 2023-09-15 NOTE — Transfer of Care (Signed)
Immediate Anesthesia Transfer of Care Note  Patient: Tyler [name]  Procedure(s) Performed: AMPUTATION TOE (Right: Toe) IRRIGATION AND DEBRIDEMENT WOUND (Left: Foot)  Patient Location: PACU  Anesthesia Type:General  Level of Consciousness: drowsy  Airway & Oxygen Therapy: Patient Spontanous Breathing and Patient connected to face mask oxygen  Post-op Assessment: Report given to RN, Post -op Vital signs reviewed and stable, and Patient moving all extremities  Post vital signs: Reviewed and stable  Last Vitals:  Vitals Value Taken Time  BP    Temp    Pulse    Resp    SpO2      Last Pain:  Vitals:   09/15/23 0800  TempSrc: Oral  PainSc:       Patients Stated Pain Goal: 0 (09/14/23 0508)  Complications: No notable events documented.

## 2023-09-15 NOTE — Interval H&P Note (Signed)
History and Physical Interval Note:  09/15/2023 8:59 AM  Tyler [name]  has presented today for surgery, with the diagnosis of Osteomeylitis right foot, ulcer left foot.  The various methods of treatment have been discussed with the patient and family. After consideration of risks, benefits and other options for treatment, the patient has consented to  Procedure(s) with comments: AMPUTATION TOE (Right) - Right hallux amputation IRRIGATION AND DEBRIDEMENT WOUND (Left) - Left foot wound debridement, possible graft/biopsy as a surgical intervention.  [name] history has been reviewed, patient examined, no change in status, stable for surgery.  I have reviewed the patient's chart and labs.  Questions were answered to the patient's satisfaction.     Louann Sjogren

## 2023-09-15 NOTE — Anesthesia Procedure Notes (Signed)
Procedure Name: MAC Date/Time: 09/15/2023 9:05 AM  Performed by: Katherine Basset, CRNAPre-anesthesia Checklist: Patient identified, Emergency Drugs available, Patient being monitored and Suction available Patient Re-evaluated:Patient Re-evaluated prior to induction Oxygen Delivery Method: Simple face mask Ventilation: Oral airway inserted - appropriate to patient size

## 2023-09-16 ENCOUNTER — Encounter: Payer: Self-pay | Admitting: Vascular Surgery

## 2023-09-16 LAB — CBC
HCT: 33.1 % — ABNORMAL LOW (ref 39.0–52.0)
Hemoglobin: 10.1 g/dL — ABNORMAL LOW (ref 13.0–17.0)
MCH: 26.6 pg (ref 26.0–34.0)
MCHC: 30.5 g/dL (ref 30.0–36.0)
MCV: 87.3 fL (ref 80.0–100.0)
Platelets: 252 10*3/uL (ref 150–400)
RBC: 3.79 MIL/uL — ABNORMAL LOW (ref 4.22–5.81)
RDW: 16.2 % — ABNORMAL HIGH (ref 11.5–15.5)
WBC: 8.1 10*3/uL (ref 4.0–10.5)
nRBC: 0 % (ref 0.0–0.2)

## 2023-09-16 LAB — BASIC METABOLIC PANEL
Anion gap: 11 (ref 5–15)
BUN: 51 mg/dL — ABNORMAL HIGH (ref 8–23)
CO2: 23 mmol/L (ref 22–32)
Calcium: 8.8 mg/dL — ABNORMAL LOW (ref 8.9–10.3)
Chloride: 104 mmol/L (ref 98–111)
Creatinine, Ser: 1.37 mg/dL — ABNORMAL HIGH (ref 0.61–1.24)
GFR, Estimated: 52 mL/min — ABNORMAL LOW (ref >60)
Glucose, Bld: 160 mg/dL — ABNORMAL HIGH (ref 70–99)
Potassium: 3.7 mmol/L (ref 3.5–5.1)
Sodium: 138 mmol/L (ref 135–145)

## 2023-09-16 LAB — GLUCOSE, CAPILLARY
Glucose-Capillary: 162 mg/dL — ABNORMAL HIGH (ref 70–99)
Glucose-Capillary: 193 mg/dL — ABNORMAL HIGH (ref 70–99)
Glucose-Capillary: 157 mg/dL — ABNORMAL HIGH (ref 70–99)
Glucose-Capillary: 156 mg/dL — ABNORMAL HIGH (ref 70–99)

## 2023-09-16 LAB — MAGNESIUM: Magnesium: 2 mg/dL (ref 1.7–2.4)

## 2023-09-16 MED ORDER — HYDROMORPHONE HCL 1 MG/ML IJ SOLN
0.5000 mg | Freq: Once | INTRAMUSCULAR | Status: AC
  Administered 2023-09-16: 0.5 mg via INTRAVENOUS
  Filled 2023-09-16: qty 0.5

## 2023-09-16 MED ORDER — DOXYCYCLINE HYCLATE 100 MG PO TABS
100.0000 mg | ORAL_TABLET | Freq: Two times a day (BID) | ORAL | Status: DC
  Administered 2023-09-17 – 2023-09-21 (×9): 100 mg via ORAL
  Filled 2023-09-16 (×9): qty 1

## 2023-09-16 MED ORDER — SENNOSIDES-DOCUSATE SODIUM 8.6-50 MG PO TABS
1.0000 | ORAL_TABLET | Freq: Two times a day (BID) | ORAL | Status: DC
Start: 2023-09-16 — End: 2023-09-21
  Administered 2023-09-16 – 2023-09-21 (×10): 1 via ORAL
  Filled 2023-09-16 (×11): qty 1

## 2023-09-16 MED ORDER — POTASSIUM CHLORIDE 20 MEQ PO PACK
40.0000 meq | PACK | Freq: Once | ORAL | Status: AC
  Administered 2023-09-16: 40 meq via ORAL
  Filled 2023-09-16: qty 2

## 2023-09-16 MED ORDER — AMOXICILLIN-POT CLAVULANATE 875-125 MG PO TABS: 1.0000 | ORAL_TABLET | Freq: Two times a day (BID) | ORAL | Status: DC

## 2023-09-16 MED ORDER — DM-GUAIFENESIN ER 30-600 MG PO TB12
1.0000 | ORAL_TABLET | Freq: Two times a day (BID) | ORAL | Status: AC
  Administered 2023-09-16 – 2023-09-20 (×10): 1 via ORAL
  Filled 2023-09-16 (×10): qty 1

## 2023-09-16 MED ORDER — POLYETHYLENE GLYCOL 3350 17 G PO PACK
17.0000 g | PACK | Freq: Every day | ORAL | Status: DC
  Administered 2023-09-16 – 2023-09-21 (×6): 17 g via ORAL
  Filled 2023-09-16 (×6): qty 1

## 2023-09-16 MED ORDER — BISACODYL 10 MG RE SUPP: 10.0000 mg | Freq: Every day | RECTAL | Status: DC | PRN

## 2023-09-16 MED ORDER — METHOCARBAMOL 500 MG PO TABS
500.0000 mg | ORAL_TABLET | Freq: Three times a day (TID) | ORAL | Status: DC | PRN
  Administered 2023-09-16 – 2023-09-20 (×8): 500 mg via ORAL
  Filled 2023-09-16 (×8): qty 1

## 2023-09-16 NOTE — Consult Note (Signed)
WOC team consulted for "foot wounds".  Patient has since undergone surgery by podiatry for R great toe amputation and L foot I&D with wound graft.  I visited patient and asked if there were other wounds that needed to be addressed and he assured me there are not.    WOC team will sign off.  Re-consult if further needs arise.  Podiatry will manage their postop wounds.   Thank you,    Priscella Mann MSN, RN-BC, [employer] [phone]

## 2023-09-16 NOTE — Progress Notes (Signed)
Heart Failure Stewardship Pharmacy Note  PCP: Elder Negus, NP PCP-Cardiologist: None  HPI: Tyler [name] is a 77 y.o. male with HTN, HLD, DM, PVD, SSS (s/p PPM), dCHF, CKD-3A, obesity, rheumatic fever, atrial fibrillation not on anticoagulants, chronic diabetic foot ulcers, s/p third metatarsal head resection with bone biopsy right foot who presented with worsening dyspnea, weight gain, PND, and orthopnea. Has been ED visits this year. Troponin on admission was 18. BNP on admission was elevated at 3191.5 CXR on admission showed stable vascular congestion. Underwent right great toe amputation with I&D and wound graft on 09/15/23.    Pertinent cardiac history: Long term holter in 03/2020 showed 6.1% PVC burden and multiple AF episodes. Ablation performed and pacemaker placed in 08/2020. Stress test 03/2023 showed no evidence of ischemia and LVEF of 57%. TTE in 08/2020 and 09/2020 showed LVEF of 55-60%. Echo 07/03/23 showed LVEF of 30-35% and moderate MR.   Pertinent Lab Values: Creatinine  Date Value Ref Range Status  07/10/2014 1.26 0.60 - 1.30 mg/dL Final   Creatinine, Ser  Date Value Ref Range Status  09/12/2023 1.45 (H) 0.61 - 1.24 mg/dL Final   BUN  Date Value Ref Range Status  09/12/2023 51 (H) 8 - 23 mg/dL Final  60/45/4098 10 7 - 18 mg/dL Final   Potassium  Date Value Ref Range Status  09/12/2023 4.2 3.5 - 5.1 mmol/L Final  07/10/2014 3.7 3.5 - 5.1 mmol/L Final   Sodium  Date Value Ref Range Status  09/12/2023 135 135 - 145 mmol/L Final  07/10/2014 142 136 - 145 mmol/L Final   B Natriuretic Peptide  Date Value Ref Range Status  09/11/2023 3,874.2 (H) 0.0 - 100.0 pg/mL Final    Comment:    Performed at [facility], [address] [place], [place] [address]   Magnesium  Date Value Ref Range Status  08/17/2023 2.3 1.7 - 2.4 mg/dL Final    Comment:    Performed at [facility], [address] [place], [place] [address]  07/10/2014 1.8 mg/dL  Final    Comment:    6.2-1.3 THERAPEUTIC RANGE: 4-7 mg/dL TOXIC: > 10 mg/dL  -----------------------    Hemoglobin A1C  Date Value Ref Range Status  07/05/2014 10.4 (H) 4.2 - 6.3 % Final    Comment:    The American Diabetes Association recommends that a primary goal of therapy should be <7% and that physicians should reevaluate the treatment regimen in patients with HbA1c values consistently >8%. Hemoglobin A1c - ** THIS IS A CORRECTED REPORT **  - PLEASE DISREGARD PREVIOUS RESULT.  - Corrected report called to and read back  - by Crista Elliot on 1C @ 1155 07/06/14.  - BGB    Hgb A1c MFr Bld  Date Value Ref Range Status  07/02/2023 7.2 (H) 4.8 - 5.6 % Final    Comment:    (NOTE) Pre diabetes:          5.7%-6.4%  Diabetes:              >6.4%  Glycemic control for   <7.0% adults with diabetes    TSH  Date Value Ref Range Status  06/28/2020 1.726 0.350 - 4.500 uIU/mL Final    Comment:    Performed by a 3rd Generation assay with a functional sensitivity of <=0.01 uIU/mL. Performed at [facility], [address] [place], [place] [address]     Vital Signs: Admission weight: none Temp:  [97.7 F (36.5 C)-97.9 F (36.6  C)] 97.9 F (36.6 C) (12/23 0405) Pulse Rate:  [74-110] 110 (12/22 1929) Cardiac Rhythm: Ventricular paced (12/22 1900) Resp:  [15-22] 20 (12/22 1929) BP: (87-139)/(48-88) 139/69 (12/23 0405) SpO2:  [93 %-98 %] 97 % (12/22 1045)  Intake/Output Summary (Last 24 hours) at 09/16/2023 1610 Last data filed at 09/15/2023 2136 Gross per 24 hour  Intake 500 ml  Output 1805 ml  Net -1305 ml   Current Heart Failure Medications:  Loop diuretic: bumetanide 2 mg IV Q8H Beta-Blocker: metoprolol succinate 50 mg daily ACEI/ARB/ARNI: none MRA: none SGLT2i: none   Prior to admission Heart Failure Medications:  Loop diuretic: bumetanide 2 mg po daily Beta-Blocker: metoprolol succinate 50 mg daily ACEI/ARB/ARNI: none MRA: none SGLT2i:  none Other: metolazone 2.5 mg twice weekly   Assessment: 1. 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 NYHA class III symptoms.  -Symptoms: Shortness of breath has improved. LEE is present at baseline due to lymphedema, but appears to be going down. Reports mucous production.  -Volume: Still appears to be mildly hypervolemic on exam. Bumetanide at 2 mg IV q8h. Creatinine has improved with diuresis. No weights taken since 09/14/23.  -Hemodynamics: BP improved to/ 120-130s/70-80s with HR in 80-90s. -BB: Previously did not tolerate carvedilol due to blurred vision, now on metoprolol succinate 50 mg daily.  -ACE/ARB/ARNI: Did not tolerate Entresto due to dizziness. Can consider restarting losartan. -MRA: Finerenone reordered inpatient. Since this is not on formulary, patient would need to provide supply. -SGLT2i: Not a candidate given chronic foley catheter.  -Given AF and CHADSVASc >3, may benefit from chronic anticoagulation, however with history of bleeding, patient insisted staying off anticoagulation.    Plan: 1) Medication changes recommended at this time: -Consider restarting losartan 25 mg daily -Consider giving metolazone 5 mg today with potassium replacement.   2) Patient assistance: -Sherryll Burger and [name] are $0   3) Education: - Patient has been educated on current HF medications and potential additions to HF medication regimen - Patient verbalizes understanding that over the next few months, these medication doses may change and more medications may be added to optimize HF regimen - Patient has been educated on basic disease state pathophysiology and goals of therapy   Medication Assistance / Insurance Benefits Check: Does the patient have prescription insurance? Prescription Insurance: Commercial   Type of insurance plan:     Outpatient Pharmacy: Prior to admission outpatient pharmacy: [facility] Please do not hesitate to reach out with questions or  concerns,   Enos Fling, PharmD, CPP, BCPS Heart Failure Pharmacist  Phone - [phone] 08/16/2023 7:35 AM

## 2023-09-16 NOTE — TOC Progression Note (Signed)
Transition of Care [facility]) - Progression Note    Patient Details  Name: Tyler [name] MRN: [id number] Date of Birth: 05/22/1946  [facility]) CM/SW Contact  Truddie Hidden, RN Phone Number: 09/16/2023, 10:44 AM  Clinical Narrative:    TOC continuing to follow patient's progress throughout discharge planning.        Expected Discharge Plan and Services         Expected Discharge Date: 09/12/23                                     Social Determinants of Health (SDOH) Interventions SDOH Screenings   Food Insecurity: No Food Insecurity (09/13/2023)  Housing: Patient Declined (09/14/2023)  Transportation Needs: Patient Declined (09/14/2023)  Utilities: Patient Declined (09/14/2023)  Alcohol Screen: Low Risk  (08/14/2023)  Financial Resource Strain: Low Risk  (03/20/2023)   Received from [facility], [facility]  Social Connections: Unknown (02/01/2022)   Received from [facility], [facility]  Stress: No Stress Concern Present (09/07/2020)   Received from [facility], [facility]  Tobacco Use: Low Risk  (09/11/2023)    Readmission Risk Interventions    09/12/2023   12:13 PM 08/15/2023    3:57 PM  Readmission Risk Prevention Plan  Transportation Screening Complete Complete  PCP or Specialist Appt within 3-5 Days Complete Complete  HRI or Home Care Consult Complete Complete  Social Work Consult for Recovery Care Planning/Counseling Complete Complete  Palliative Care Screening Not Applicable Not Applicable  Medication Review Oceanographer) Complete Complete

## 2023-09-16 NOTE — Progress Notes (Signed)
  Subjective:  Patient ID: Tyler [name], male    DOB: 1946/08/31,  MRN: [id number]  Chief Complaint  Patient presents with   Shortness of Breath    DOS: 09/15/23 Procedure: Amputation of right great toe, left foot plantar foot debridement and graft application  77 y.o. male seen for post op check. Reports no pain, doing well. Does feel like he has fluid on his lungs and not breathing the best. Discussed findings and plan for his feet.   Review of Systems: Negative except as noted in the HPI. Denies N/V/F/Ch.   Objective:   Vitals:   09/16/23 1200 09/16/23 1315  BP: 121/65   Pulse: 90   Resp: 15   Temp:  98.7 F (37.1 C)  SpO2: 95%    Body mass index is 30.06 kg/m. Constitutional Well developed. Well nourished.  Vascular Foot warm and well perfused. Capillary refill normal to all digits.   No calf pain with palpation  Neurologic Normal speech. Oriented to person, place, and time. Epicritic sensation absent to feet  Dermatologic Dressing C/D/I to bilateral foot  Orthopedic: S/p right hallux amputaiton, left foot debridement and graft app   Radiographs: S/p R hallux amp  Pathology: p  Micro: Few Staph Aureus  Assessment:   Right hallux osteomyelitis, left foot ulcer to fat level plantar midfoot  Plan:  Patient was evaluated and treated and all questions answered.  POD # 1 s/p Right hallux amp at MPJ level, left foot wound irrigation and debvridement with application of graft -Progressing well post op, no pain -XR: post op changes -WB Status:  -Sutures: May be heel weight bearing on the right foot and partial minimal weight bearing on the left with most weight toward to heel. . -Medications/ABX: 2 weeks doxycyline on discharge -Dressing to remain c/d/I until follow up in 1 weeks - Return in 1 week to [place] office on 12/30 with Dr. Ralene Cork.        Corinna Gab, [name] [facility] / [facility]

## 2023-09-16 NOTE — Consult Note (Addendum)
Pharmacy Antibiotic Note  Tyler [name] is a 77 y.o. male with PMH of CHF, Afib, CKD-3A, obesity, chronic diabetic foot ulcers, s/p third metatarsal head resection with bone biopsy right foot who was admitted to the ED on 09/11/2023 with SOB and osteomyelitis of right foot.  Pharmacy has been consulted for Vancomycin dosing.  Assessment 09/16/2023:  Day 4 of antibiotics. Pt remains Afebrile and WBC trending downward 11.3 > 8.1. Scr remains stable. Procedure of right toe amputation occurred 12/22. 12/22 right great toe tissue culture shows growth of few staphylococcus aureus. Podiatry stated okay for discharge 12/22 and will need oral antibiotics to transition outpatient.  Plan:  If vancomycin is continued plan to order vancomycin peak level on 12/24 @ 0900 (2 hours after infusion) and trough level on 12/25 @ 0400 (1 hour before next dose). If vancomycin is not neccessary, recommend discontinuing vancomycin and ceftriaxone and starting Augmentin 875 mg BID and doxycycline 100 mg BID for empiric coverage. Monitor cultures for further de-escalation.  Height: 6\' 6"  (198.1 cm) Weight: 118 kg (260 lb 2.3 oz) IBW/kg (Calculated) : 91.4  Temp (24hrs), Avg:98.1 F (36.7 C), Min:97.9 F (36.6 C), Max:98.7 F (37.1 C)  Recent Labs  Lab 09/11/23 1627 09/12/23 0340 09/13/23 0428 09/14/23 0258 09/15/23 0235 09/16/23 0420  WBC 11.3* 9.9  --  9.8 8.7 8.1  CREATININE 1.55* 1.45* 1.38* 1.31* 1.32* 1.37*    Estimated Creatinine Clearance: 63.1 mL/min (A) (by C-G formula based on SCr of 1.37 mg/dL (H)).    Allergies  Allergen Reactions   Eliquis [Apixaban]     Dizziness  and vision change   Spironolactone Other (See Comments)    dizziness    Antimicrobials this admission: 12/20 vancomycin >> 12/19 ceftriaxone >>  12/20 cefazolin x 1 dose 12/18 azithromycin x 1 dose  Dose adjustments this admission: -NA  Microbiology results: 12/22 right great toe tissue Cx: few staphylococcus  aureus  Thank you for allowing pharmacy to be a part of this patient's care.  Kipp Laurence, PharmD Candidate 09/16/2023 1:29 PM

## 2023-09-16 NOTE — Progress Notes (Cosign Needed Addendum)
[facility] CLINIC CARDIOLOGY PROGRESS NOTE       Patient ID: BROADUS [name] MRN: [id number] DOB/AGE: [id number] y.o.  Admit date: 09/11/2023 Referring Physician Dr. Lindajo Royal Primary Physician Elder Negus, NP  Primary Cardiologist Dr. Corky Sing (has been scheduled but no showed appointment) Reason for Consultation AoCHF  HPI: Tyler [name] is a 77 y.o. male  with a past medical history of  chronic HFrEF, persistent atrial fibrillation, mitral insufficiency, CHB s/p PPM 2021, hypertension, chronic kidney disease stage III  who presented to the ED on 09/11/2023 for shortness of breath. Cardiology was consulted for further evaluation.   Interval history: -Patient seen and examined this AM, reports feeling less SOB.  -Endorses productive cough. This is his main complaint at this time.  -Renal function stable. Good UOP with IV diuresis.   Review of systems complete and found to be negative unless listed above    Past Medical History:  Diagnosis Date   (HFpEF) heart failure with preserved ejection fraction (HCC) 03/01/2020   a.) TTE 03/01/2020: EF 55-60%, mod MAC, mod AoV sclerosis, triv AR, mild TR, mod MR, RVSP 50-59; b.) TTE 09/10/2020: EF 55-60%, mod MAC, mod AoV sclerosis, mild TR, 3+ MR, RVSP 50-59; c.) TTE 09/26/2020: EF 55-60%, mild LA dil, triv PR, mild MR/TR, RVSP 37-49   Adenoma of left adrenal gland    Anemia    Arthritis    Atrial fibrillation and flutter (HCC)    a.) CHA2DS2VASc = 5 (age x2, HFpEF, HTN, T2DM);  b.) s/p CTI ablation 09/07/2020; c.) rate/rhythm maintained on oral carvedilol; not on chronic anticoagulation therapy   CAD (coronary artery disease)    Cardiomegaly    CKD (chronic kidney disease), stage III (HCC)    DOE (dyspnea on exertion)    Drug-induced bradycardia    Gangrene of toe of left foot (HCC)    a.) 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    HLD (hyperlipidemia)     Hypertension    Long term current use of aspirin    Lymphedema of both lower extremities    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)    a.) s/p amputation 11/04/2022   Peripheral vascular disease (HCC)    Pleural effusion on right 09/09/2020   a.) s/p RIGHT thoracentesis with 2180 cc yield   Pneumonia    Presence of permanent cardiac pacemaker 09/10/2020   a.) TVP placement 09/10/2020 due to intermittent CHB in setting of urosepsis; b.) s/p PPM placement 09/15/2020: MDT Azure XT DR (SN: [id number])   Pulmonary hypertension (HCC) 03/01/2020   a.) TTE: 03/01/2020: RVSP 50-59; b.) TTE 09/10/2020: RVSP 50-59; c.) TTE 09/26/2020: RVSP 37-49   RA (rheumatoid arthritis) (HCC)    Rheumatic fever    Sepsis (HCC) 09/10/2020   a.) urosepsis --> BC x 2 sets and UC all grew out significant Proteus mirabilis; admitted to [facility] 09/07/2020 - 09/27/2020.   Sick sinus syndrome [facility])    a.) s/p MDT PPM placement 09/15/2020   T2DM (type 2 diabetes mellitus) (HCC)    Urinary retention    chronic, with indwelling Foley catheter and plans for a suprapubic   Wears dentures    full upper    Past Surgical History:  Procedure Laterality Date   AMPUTATION TOE Right 11/04/2022   Procedure: AMPUTATION TOE;  Surgeon: Felecia Shelling, [name];  [name];  Service: Podiatry;  Laterality: Right;  BONE BIOPSY Right 04/05/2023   Procedure: BONE BIOPSY THIRD & FOURTH;  Surgeon: Felecia Shelling, [name];  [name];  Service: Podiatry;  Laterality: Right;   CARDIAC ELECTROPHYSIOLOGY STUDY AND ABLATION N/A 09/07/2020   Procedure: CARDIAC EP STUDY AND ABLATION (CTI)   CATARACT EXTRACTION W/PHACO Right 01/16/2017   Procedure: CATARACT EXTRACTION PHACO AND INTRAOCULAR LENS PLACEMENT (IOC)  Right Complicated;  Surgeon: Lockie Mola, MD;  Location: [facility];  Service: Ophthalmology;  Laterality: Right;  IVA Block Healon 5 malyugin vision blue Diabetic - oral meds   CATARACT  EXTRACTION W/PHACO Left 10/23/2022   Procedure: CATARACT EXTRACTION PHACO AND INTRAOCULAR LENS PLACEMENT (IOC) LEFT DIABETIC;  Surgeon: Galen Manila, MD;  Location: [facility];  Service: Ophthalmology;  Laterality: Left;  Diabetic   COLONOSCOPY     LOWER EXTREMITY ANGIOGRAPHY Right 11/02/2022   Procedure: Lower Extremity Angiography;  Surgeon: Annice Needy, MD;  Location: [facility] IN[facility];  Service: Cardiovascular;  Laterality: Right;   LOWER EXTREMITY ANGIOGRAPHY Right 09/13/2023   Procedure: Lower Extremity Angiography;  Surgeon: Renford Dills, MD;  Location: [facility] INV[facility] CV [facility];  Service: Cardiovascular;  Laterality: Right;   METATARSAL HEAD EXCISION Left 07/05/2018   Procedure: RESECTION FIRST METATARSAL INFECTED BONE AND SOFT TISSUE;  Surgeon: Recardo Evangelist, [name];  [name];  Service: Podiatry;  Laterality: Left;   METATARSAL HEAD EXCISION Right 04/05/2023   Procedure: METATARSAL HEAD EXCISION THIRD & FOURTH;  Surgeon: Felecia Shelling, [name];  [name];  Service: Podiatry;  Laterality: Right;   PACEMAKER INSERTION  09/15/2020   TOE AMPUTATION Left    TONSILLECTOMY      Medications Prior to Admission  Medication Sig Dispense Refill Last Dose/Taking   acetaminophen (TYLENOL) 500 MG tablet Take 2 tablets (1,000 mg total) by mouth 3 (three) times daily as needed (for pain).   Taking As Needed   aspirin EC 81 MG tablet Take 1 tablet (81 mg total) by mouth daily. Swallow whole. 30 tablet 0 09/11/2023   bumetanide (BUMEX) 2 MG tablet Take 1 tablet (2 mg total) by mouth daily. 90 tablet 3 09/11/2023   Finerenone (KERENDIA) 10 MG TABS Take 1 tablet (10 mg total) by mouth daily. 30 tablet 2 09/11/2023 Morning   gentamicin cream (GARAMYCIN) 0.1 % Apply 1 Application topically 2 (two) times daily. 30 g 1 09/11/2023 Morning   glipiZIDE (GLUCOTROL) 10 MG tablet Take 1 tablet (10 mg total) by mouth 2 (two) times daily. 60 tablet 2 09/11/2023 Morning   metFORMIN  (GLUCOPHAGE) 1000 MG tablet Take 1 tablet (1,000 mg total) by mouth 2 (two) times daily. 60 tablet 2 09/11/2023 Morning   metoprolol succinate (TOPROL-XL) 50 MG 24 hr tablet Take 1 tablet (50 mg total) by mouth daily. Take with or immediately following a meal. 30 tablet 2 09/11/2023   Multiple Vitamins-Minerals (MULTIVITAMIN WITH MINERALS) tablet Take 1 tablet by mouth daily.   09/11/2023   silver sulfADIAZINE (SILVADENE) 1 % cream Apply to affected area daily 50 g 1 09/11/2023   Social History   Socioeconomic History   Marital status: Single    Spouse name: Not on file   Number of children: Not on file   Years of education: Not on file   Highest education level: Not on file  Occupational History    Comment: [employer]  Tobacco Use   Smoking status: Never   Smokeless tobacco: Never  Vaping Use   Vaping status: Never Used  Substance  and Sexual Activity   Alcohol use: Not Currently    Comment: rarely   Drug use: Never   Sexual activity: Not Currently    Birth control/protection: None  Other Topics Concern   Not on file  Social History Narrative   Lives with roommate   Social Drivers of Health   Financial Resource Strain: Low Risk  (03/20/2023)   Received from [facility], [facility]   Overall Financial Resource Strain (CARDIA)    Difficulty of Paying Living Expenses: Not hard at all  Food Insecurity: No Food Insecurity (09/13/2023)   Hunger Vital Sign    Worried About Running Out of Food in the Last Year: Never true    Ran Out of Food in the Last Year: Never true  Transportation Needs: Patient Declined (09/14/2023)   PRAPARE - Transportation    Lack of Transportation (Medical): Patient declined    Lack of Transportation (Non-Medical): Patient declined  Physical Activity: Not on file  Stress: No Stress Concern Present (09/07/2020)   Received from [facility], [facility]   [employer] of Occupational [facility] - Occupational Stress Questionnaire     Feeling of Stress : Not at all  Social Connections: Unknown (02/01/2022)   Received from [facility], [facility]   Social Network    Social Network: Not on file  Intimate Partner Violence: Patient Declined (09/14/2023)   Humiliation, Afraid, Rape, and Kick questionnaire    Fear of Current or Ex-Partner: Patient declined    Emotionally Abused: Patient declined    Physically Abused: Patient declined    Sexually Abused: Patient declined    Family History  Problem Relation Age of Onset   Cancer Niece      Vitals:   09/15/23 2308 09/16/23 0405 09/16/23 0800 09/16/23 0850  BP: 112/88 139/69 137/65 137/65  Pulse:   84 84  Resp:   20   Temp: 97.9 F (36.6 C) 97.9 F (36.6 C)    TempSrc: Oral Oral    SpO2:   98%   Weight:      Height:        PHYSICAL EXAM General: Chronically ill appearing male, well nourished, in no acute distress sitting upright in bedside chair. HEENT: Normocephalic and atraumatic. Neck: No JVD.  Lungs: Normal respiratory effort on room air. Clear bilaterally to auscultation. No wheezes, crackles, rhonchi.  Heart: HRRR. Normal S1 and S2 without gallops or murmurs.  Abdomen: Non-distended appearing.  Msk: Normal strength and tone for age. Extremities: Warm and well perfused. No clubbing, cyanosis. Chronic appearing bilateral LE edema, bilateral foot ulcers.  Neuro: Alert and oriented X 3. Psych: Answers questions appropriately.   Labs: Basic Metabolic Panel: Recent Labs    09/15/23 0235 09/16/23 0420  NA 134* 138  K 3.4* 3.7  CL 102 104  CO2 23 23  GLUCOSE 214* 160*  BUN 55* 51*  CREATININE 1.32* 1.37*  CALCIUM 8.6* 8.8*  MG 2.2 2.0   Liver Function Tests: No results for input(s): "AST", "ALT", "ALKPHOS", "BILITOT", "PROT", "ALBUMIN" in the last 72 hours. No results for input(s): "LIPASE", "AMYLASE" in the last 72 hours. CBC: Recent Labs    09/15/23 0235 09/16/23 0420  WBC 8.7 8.1  HGB 10.0* 10.1*  HCT 32.6* 33.1*  MCV 85.3 87.3   PLT 236 252   Cardiac Enzymes: No results for input(s): "CKTOTAL", "CKMB", "CKMBINDEX", "TROPONINIHS" in the last 72 hours.  BNP: No results for input(s): "BNP" in the last 72 hours.  D-Dimer: No results for  input(s): "DDIMER" in the last 72 hours. Hemoglobin A1C: No results for input(s): "HGBA1C" in the last 72 hours. Fasting Lipid Panel: No results for input(s): "CHOL", "HDL", "LDLCALC", "TRIG", "CHOLHDL", "LDLDIRECT" in the last 72 hours. Thyroid Function Tests: No results for input(s): "TSH", "T4TOTAL", "T3FREE", "THYROIDAB" in the last 72 hours.  Invalid input(s): "FREET3" Anemia Panel: No results for input(s): "VITAMINB12", "FOLATE", "FERRITIN", "TIBC", "IRON", "RETICCTPCT" in the last 72 hours.   Radiology: DG Foot 2 Views Right Result Date: 09/15/2023 CLINICAL DATA:  Right foot osteomyelitis. EXAM: RIGHT FOOT - 2 VIEW COMPARISON:  MRI 09/12/2023.  X-ray 09/11/2023 FINDINGS: Interval great toe amputation at the level of the MTP joint. Stable appearance third toe amputation at the mid metatarsal level with evidence of prior resection of the fourth metatarsal head. IMPRESSION: Interval great toe amputation at the level of the MTP joint. Electronically Signed   By: Kennith Center M.D.   On: 09/15/2023 14:04   PERIPHERAL VASCULAR CATHETERIZATION Result Date: 09/13/2023 See surgical note for result.  MR FOOT LEFT WO CONTRAST Result Date: 09/12/2023 CLINICAL DATA:  Plantar foot ulcer. EXAM: MRI OF THE LEFT FOOT WITHOUT CONTRAST TECHNIQUE: Multiplanar, multisequence MR imaging of the left forefoot was performed. No intravenous contrast was administered. COMPARISON:  Left foot x-rays from yesterday. CT left foot dated July 06, 2023. FINDINGS: Bones/Joint/Cartilage No suspicious marrow signal abnormality. Subtle periarticular marrow edema along the plantar aspect of the first TMT joint is also seen at the dorsal aspect of the joint and is most likely degenerative. Prior first ray  amputation with associated heterotopic ossification. No fracture or dislocation. 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 Mild midfoot degenerative changes. No joint effusion. Ligaments Collateral ligaments are intact. Muscles and Tendons No tenosynovitis. Complete fatty atrophy of the intrinsic foot muscles. Soft tissue Ulceration along the medial plantar aspect of the midfoot near the base of the medial cuneiform. Diffuse soft tissue swelling. No fluid collection. No soft tissue mass. IMPRESSION: 1. Skin ulceration along the medial plantar aspect of the midfoot near the base of the medial cuneiform. No evidence of osteomyelitis or abscess. Electronically Signed   By: [name] M.D.   On: 09/12/2023 18:01   MR FOOT RIGHT WO CONTRAST Result Date: 09/12/2023 CLINICAL DATA:  Right foot ulcer. EXAM: MRI OF THE RIGHT FOREFOOT WITHOUT CONTRAST TECHNIQUE: Multiplanar, multisequence MR imaging of the right forefoot was performed. No intravenous contrast was administered. COMPARISON:  Right foot x-rays from yesterday. CT right foot dated July 06, 2023. FINDINGS: Bones/Joint/Cartilage Abnormal marrow edema involving the first proximal phalanx head and majority of the first distal phalanx. First IP joint effusion. Prior third ray distal fourth metatarsal amputations. No fracture or dislocation. Unchanged severe first MTP joint osteoarthritis. No joint effusion. Ligaments Collateral ligaments are intact. Muscles and Tendons No tenosynovitis. Near complete fatty atrophy of the intrinsic muscles. Soft tissue Deep ulceration along the medial plantar aspect of the great toe with sinus tract extending to the IP joint. Diffuse soft tissue swelling without focal fluid collection. Small ganglion cyst along the plantar aspect of the second metatarsal head. IMPRESSION: 1. Deep ulceration along the medial plantar aspect of the great toe with sinus  tract extending to the IP joint. Associated septic arthritis and osteomyelitis of the first proximal and distal phalanges. No abscess. Electronically Signed   By: [name] M.D.   On: 09/12/2023 17:56   DG Foot 2 Views Right Result Date:  09/11/2023 CLINICAL DATA:  Diabetic foot ulcer EXAM: RIGHT FOOT - 2 VIEW COMPARISON:  None Available. FINDINGS: Transmetatarsal amputation of the right third toe and resection of the right fourth metatarsal head. Large ulcer is seen within the medial and plantar right great toe which extends to the cortex of the base of the distal phalanx with osseous erosion in this location in keeping with changes of osteomyelitis. There is extensive superimposed soft tissue swelling right great toe and second toe as well as moderate soft tissue swelling of the right forefoot diffusely. Vascular calcifications are noted. Advanced degenerative arthritis noted of the first metatarsophalangeal joint. No acute fracture or dislocation. IMPRESSION: 1. Large ulcer within the medial and plantar right great toe which extends to the cortex of the base of the distal phalanx with osseous erosion in this location in keeping with changes of osteomyelitis. 2. Transmetatarsal amputation of the right third toe and resection of the right fourth metatarsal head. Electronically Signed   By: Helyn Numbers M.D.   On: 09/11/2023 22:48   DG Foot 2 Views Left Result Date: 09/11/2023 CLINICAL DATA:  Diabetic ulcer EXAM: LEFT FOOT - 2 VIEW COMPARISON:  None Available. FINDINGS: No acute fracture or dislocation. 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 There is a soft tissue wound within the soft tissues distal to the residual first metatarsal. No definite superimposed osseous erosion. There is diffuse soft tissue swelling of the second and third digits. Posttraumatic changes noted involving the second metatarsal head. Moderate midfoot and tibiotalar degenerative arthritis. Vascular  calcifications are noted. IMPRESSION: 1. 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 Soft tissue wound within the soft tissues distal to the residual first metatarsal. No definite superimposed osseous erosion. 2. Diffuse soft tissue swelling of the second and third digits. Electronically Signed   By: Helyn Numbers M.D.   On: 09/11/2023 22:46   DG Chest 2 View Result Date: 09/11/2023 CLINICAL DATA:  Shortness of breath EXAM: CHEST - 2 VIEW COMPARISON:  08/13/2023 FINDINGS: Small bilateral pleural effusions. No frank interstitial edema. Mild vague right upper lobe opacity, suspicious for pneumonia. No pleural effusion or pneumothorax. Cardiomegaly.  Left subclavian pacemaker. Degenerative changes of the visualized thoracolumbar spine. IMPRESSION: Mild vague right upper lobe opacity, suspicious for pneumonia. Small bilateral pleural effusions. No frank interstitial edema. Electronically Signed   By: Charline Bills M.D.   On: 09/11/2023 18:09    ECHO 06/2023: 1. Left ventricular ejection fraction, by estimation, is 30 to 35%. The left ventricle has moderately decreased function. The left ventricle demonstrates regional wall motion abnormalities (see scoring diagram/findings for description). Indeterminate diastolic filling due to E-A fusion.   2. Right ventricular systolic function is normal. The right ventricular  size is normal.   3. The mitral valve is normal in structure. Moderate mitral valve  regurgitation. No evidence of mitral stenosis.   4. The aortic valve is normal in structure. Aortic valve regurgitation is  not visualized. No aortic stenosis is present.   5. The inferior vena cava is normal in size with greater than 50%  respiratory variability, suggesting right atrial pressure of 3 mmHg.   TELEMETRY reviewed by me 09/16/2023: ventricular pacing rate 90s  EKG reviewed by me: VP with PVCs rate 95 bpm  Data reviewed by me 09/16/2023: last 24h vitals tele labs imaging I/O  hospitalist progress note  Active Problems:   Persistent atrial fibrillation (HCC)   HTN (hypertension)   Chronic kidney disease, stage 3a (HCC)  Acute on chronic heart failure with reduced ejection fraction (HFrEF,30-35 %) (HCC)   Moderate mitral regurgitation by prior echocardiogram   Nonischemic cardiomyopathy (HCC)   Status cardiac pacemaker   Anemia due to chronic kidney disease   Uncontrolled type 2 diabetes mellitus with hyperglycemia, without long-term current use of insulin (HCC)   Chronic bilateral pleural effusions   Complete heart block (HCC)  s/p PACEMAKER   Infected plantar ulcers both feet (HCC)   Osteomyelitis of right foot (HCC)   CAP (community acquired pneumonia)   Osteomyelitis of great toe of right foot (HCC)   Diabetic ulcer of left midfoot associated with type 2 diabetes mellitus, with fat layer exposed (HCC)    ASSESSMENT AND PLAN:  Tyler [name] is a 77 y.o. male  with a past medical history of  chronic HFrEF, persistent atrial fibrillation, mitral insufficiency, CHB s/p PPM 2021, hypertension, chronic kidney disease stage III  who presented to the ED on 09/11/2023 for shortness of breath. Cardiology was consulted for further evaluation.   # Acute on chronic heart failure reduced EF # Acute hypoxic respiratory failure # R great toe osteomyelitis s/p amputation Patient presented with worsening SOB, LE edema. Also foot wounds with drainage. Seen at HF clinic yesterday, BNP 3500, sent to ED. BNP in ED 3800. Troponins 18 > 22. Has not required supplemental O2.  -Continue IV bumex 2 mg TID.  -Continue home metoprolol succinate 50 mg daily and finerenone 10 mg daily. Consider addition of ARB after he has been diuresed some. Did not tolerate Entresto. -Mild and flat troponin elevation most consistent with demand/supply mismatch and not ACS in the setting of acute heart failure. -No plan for further cardiac diagnostics at this time.   # CHB s/p Medtronic dual  chamber PPM 08/2020 # Hx atrial fibrillation Patient with history of persistent atrial fibrillation previously on eliquis, this was discontinued due to hematuria in 2023. Underwent PPM implant in 2021 for CHB, has had regular device checks since implant.  -Beta-blocker as above for rate control -Recommend anticoagulation however patient has historically deferred given prior bleeding issues.  # Chronic kidney disease stage IIIa Patient with hx of CKD, baseline Cr 1.3-1.4. Cr this AM 1.37. -Continue to monitor renal function closely during diuresis.  This patient's plan of care was discussed and created with Dr. Darrold Junker and he is in agreement.  Signed: Gale Journey, PA-C  09/16/2023, 9:32 AM [facility]

## 2023-09-16 NOTE — Evaluation (Signed)
Occupational Therapy Evaluation Patient Details Name: Tyler [name] MRN: [id number] DOB: [birth year] Today's Date: 09/16/2023   History of Present Illness Pt is a 77 y.o. male presenting to hospital from heart failure clinic d/t SOB, 10 pound weight gain last 2 weeks, worsening B LE edema.  Pt noted with wounds on B feet.  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  09/13/23 s/p R LE angio with stent placement.  09/15/23 s/p R hallux amputation and I&D L foot.   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   Clinical Impression   Pt upright in recliner, seen with PT to maximize functional outcomes and for safety. PTA, pt lives with a friend in 2nd floor apartment, independent in ADLs and mobility including driving and working part-time. Daughter lives an hour and half away.   Educated pt on WB precautions; pt stating it would be easier to just walk despite multiple attempts to educate pt on importance of adherence for healing. Pt reporting he used RW to walk over to recliner last night. Anticipate pt requires MAX A for LB ADLs using lateral leans due to need for compensatory techniques to maintain precautions, SETUP for UB ADLs. First transfer attempt using lateral scoot unsuccessful due to pt unable (initially refusing to attempt use of board), pt continuing to insist that it would be easier to stand. Once redirected, pt able to transfer using sliding board CGA recliner to EOB with +2 for safety. Secure chat with podiatry + PT to update on status, order then placed for bilat post op shoes. Patient will benefit from continued inpatient follow up therapy, <3 hours/day.       If plan is discharge home, recommend the following: A lot  of help with walking and/or transfers;A lot of help with bathing/dressing/bathroom;Assistance with cooking/housework;Assist for transportation    Functional Status Assessment  Patient has had a recent decline in their functional status and demonstrates the ability to make significant improvements in function in a reasonable and predictable amount of time.  Equipment Recommendations  BSC/3in1 (defer to next LOC)       Precautions / Restrictions Precautions Precautions: Fall Precaution Comments: B post op shoes Restrictions Weight Bearing Restrictions Per Provider Order: Yes RLE Weight Bearing Per Provider Order: Partial weight bearing RLE Partial Weight Bearing Percentage or Pounds: Heel WB'ing to R foot LLE Weight Bearing Per Provider Order: Partial weight bearing LLE Partial Weight Bearing Percentage or Pounds: Minimal WB'ing to L foot with most of weight toward heel      Mobility Bed Mobility Overal bed mobility: Needs Assistance Bed Mobility: Sit to Supine       Sit to supine: Supervision   General bed mobility comments: increased effort to perform on own    Transfers Overall transfer level: Needs assistance Equipment used: Sliding board Transfers: Bed to chair/wheelchair/BSC            Lateral/Scoot Transfers: Contact guard assist, With slide board, +2 safety/equipment General transfer comment: attempted to teach lateral scoot; pt reports too difficult and would rather just stand despite education on precautions recieved, sliding board introduced with pt completing transfer with +2 assist for safety and line mgmt. Pt continues to state that he would rather walk, OT/PT continued to edu pt on  compliance per MD orders for WB status and proper healing      Balance Overall balance assessment: Needs assistance Sitting-balance support: No upper extremity supported, Feet supported Sitting balance-Leahy Scale: Good Sitting balance - Comments: steady reaching within BOS                                    ADL either performed or assessed with clinical judgement   ADL Overall ADL's : Needs assistance/impaired Eating/Feeding: Set up;Sitting   Grooming: Sitting;Set up   Upper Body Bathing: Sitting;Set up   Lower Body Bathing: Cueing for safety;Sitting/lateral leans;Maximal assistance;Cueing for compensatory techniques;Cueing for sequencing Lower Body Bathing Details (indicate cue type and reason): anticipate MAX A to adhere to LE precautions Upper Body Dressing : Sitting;Set up   Lower Body Dressing: Maximal assistance;Sitting/lateral leans;Cueing for sequencing;Cueing for safety;Cueing for compensatory techniques Lower Body Dressing Details (indicate cue type and reason): anticipate MAX A for adhering to precautions Toilet Transfer: Cueing for sequencing;Cueing for safety;Transfer board;Contact guard assist Toilet Transfer Details (indicate cue type and reason): SB lateral transfer, recliner > EOB Toileting- Clothing Manipulation and Hygiene: Sitting/lateral lean;Maximal assistance       Functional mobility during ADLs: Contact guard assist;Cueing for sequencing;Cueing for safety (SB) General ADL Comments: Anticipate non-compliance with LE precautions; pt will require significant cuing and assist for compensatory strategies using SB for transfers, and for lateral leans during LB ADLs     Vision Baseline Vision/History: 1 Wears glasses Ability to See in Adequate Light: 0 Adequate Vision Assessment?: Wears glasses for reading            Pertinent Vitals/Pain Pain Assessment Pain Assessment: 0-10 Pain Score: 4  Pain Location: 10/10 neck pain; 3/10 B foot pain Pain Descriptors / Indicators: Aching, Tender, Sore, Discomfort Pain Intervention(s): Limited activity within patient's tolerance, Monitored during session, Patient requesting pain meds-RN notified     Extremity/Trunk Assessment Upper Extremity Assessment Upper Extremity  Assessment: Right hand dominant;Overall WFL for tasks assessed (grossly 4/5 MMT BUE, good grip strength)   Lower Extremity Assessment Lower Extremity Assessment: Generalized weakness;Defer to PT evaluation   Cervical / Trunk Assessment Cervical / Trunk Assessment: Other exceptions Cervical / Trunk Exceptions: forward head/shoulders   Communication Communication Communication: No apparent difficulties Cueing Techniques: Verbal cues   Cognition Arousal: Alert Behavior During Therapy: WFL for tasks assessed/performed Overall Cognitive Status: Within Functional Limits for tasks assessed                                       General Comments  feet bandaged in ace wrap            Home Living Family/patient expects to be discharged to:: Private residence Living Arrangements: Non-relatives/Friends Available Help at Discharge: Family Type of Home: Apartment Home Access: Stairs to enter [employer] of Steps: 13 steps to 2nd floor apt Entrance Stairs-Rails: Left Home Layout: One level     Bathroom Shower/Tub: Producer, television/film/video: Standard Bathroom Accessibility: Yes   Home Equipment: Grab bars - tub/shower;Rolling Walker (2 wheels)          Prior Functioning/Environment Prior Level of Function : Independent/Modified Independent;Driving;Working/employed             Mobility Comments: Independent with ambulation; 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 ADLs Comments: Independent; (+) driving  OT Problem List: Decreased strength;Decreased range of motion;Impaired balance (sitting and/or standing);Decreased safety awareness;Decreased knowledge of use of DME or AE;Pain;Decreased knowledge of precautions      OT Treatment/Interventions: Self-care/ADL training;Therapeutic exercise;DME and/or AE instruction;Therapeutic activities;Patient/family education;Balance training    OT Goals(Current goals can be found in the care plan  section) Acute Rehab OT Goals OT Goal Formulation: With patient Time For Goal Achievement: 09/30/23 Potential to Achieve Goals: Good  OT Frequency: Min 1X/week    Co-evaluation PT/OT/SLP Co-Evaluation/Treatment: Yes Reason for Co-Treatment: For patient/therapist safety;To address functional/ADL transfers PT goals addressed during session: Mobility/safety with mobility;Proper use of DME OT goals addressed during session: ADL's and self-care      AM-PAC OT "6 Clicks" Daily Activity     Outcome Measure Help from another person eating meals?: None Help from another person taking care of personal grooming?: None Help from another person toileting, which includes using toliet, bedpan, or urinal?: A Lot Help from another person bathing (including washing, rinsing, drying)?: A Lot Help from another person to put on and taking off regular upper body clothing?: A Little Help from another person to put on and taking off regular lower body clothing?: A Lot 6 Click Score: 17   End of Session Equipment Utilized During Treatment: Other (comment) (sliding board) Nurse Communication: Mobility status;Weight bearing status;Patient requests pain meds;Precautions  Activity Tolerance: Patient tolerated treatment well Patient left: in bed;with call bell/phone within reach;with bed alarm set;with nursing/sitter in room  OT Visit Diagnosis: Unsteadiness on feet (R26.81);Pain;Muscle weakness (generalized) (M62.81);Other abnormalities of gait and mobility (R26.89) Pain - part of body: Ankle and joints of foot                Time: 4098-1191 OT Time Calculation (min): 34 min Charges:  OT General Charges $OT Visit: 1 Visit OT Evaluation $OT Eval Low Complexity: 1 Low  Zanai Mallari L. Cedarius Kersh, OTR/L  09/16/23, 1:03 PM

## 2023-09-16 NOTE — Plan of Care (Signed)
  Problem: Skin Integrity: Goal: Risk for impaired skin integrity will decrease Outcome: Progressing   

## 2023-09-16 NOTE — Progress Notes (Signed)
Progress Note   Patient: Tyler [name] ZOX:[id number] DOB: 1946/04/10 DOA: 09/11/2023     5 DOS: the patient was seen and examined on 09/16/2023   Brief hospital course: Tyler [name] is a 77 y.o. 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  He presented to the hospital because of cough productive of yellowish phlegm, increasing shortness of breath, orthopnea, easy fatigability, about 10 pound weight gain in a week.  He was hospitalized on 08/13/2023 for CHF exacerbation.  His diuretic regimen had been adjusted in the outpatient setting but his symptoms did not improve. He also complained of oozing from the wounds on the bottom of his feet.   He was admitted to the hospital for acute exacerbation of chronic systolic CHF, community-acquired pneumonia, infected plantar ulcers on bilateral feet and right foot osteomyelitis.  Dr. Annamary [name], podiatrist, was consulted for right foot osteomyelitis.  He recommended MRI of the foot for further evaluation.  MRI of the left foot showed plantar surface ulcer without evidence of osteomyelitis or abscess.  MRI of the right foot showed septic arthritis of right hallux IPJ, and OM of the proximal and distal phalanx.  Podiatry was consulted who recommended right hallux amputation versus ray resection and left foot ulcer debridement.  Patient was also seen by vascular surgery who performed right lower extremity arteriogram.  Patient underwent angioplasty and stent placement of right SFA and popliteal arteries 12/20.  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  S/p R great toe amp and L foot wound I&D w/ wound graft with podiatry 12/22 - Per podiatry " -WB Status: May be heel weight bearing on the right foot and partial minimal weight bearing on the left with most weight toward to heel. . -Medications/ABX: 2 weeks doxycyline on discharge -Dressing to remain c/d/I until follow up in 1 weeks - Return in 1 week to [place] office on 12/30 with Dr. Ralene [name]."   Acute on chronic heart failure with reduced ejection fraction (HFrEF, 30-35 %) (HCC) Nonischemic cardiomyopathy Bilateral pleural effusions Moderate mitral regurgitation Patient failing outpatient management Cardiology consulted appreciate recommendations Last echo from October with EF 30 to 35% Diuresis with IV Bumex, I/Os not well tracked, 1.8L UOP. No weight this AM. ON exam BLE edema marginally improved  Patient previously on Entresto but did not tolerate it (per review of heart failure clinic note earlier in the day) Continue GDMT with metoprolol and Kerendia Daily weights with intake and output monitoring    ?CAP (community acquired pneumonia) Patient with several week history of productive cough Patient with low-grade temp of 99 leukocytosis of 11,000 chest x-ray showing right upper lobe opacity suspicious for pneumonia Received Rocephin and azithromycin in the ED S/p 5 d of rocephin  Pro-Calc 0.12 Respiratory viral panel negative  -Hold further antibiotics for this    H/o Persistent atrial fibrillation (HCC) Continue metoprolol Patient off of Eliquis due to hematuria. (per review of heart failure clinic note from earlier in the day) Patient has been previously referred for ablation Continue aspirin  Complete heart block (HCC)   s/p PACEMAKER In 2021. No  acute issues  Uncontrolled type 2 diabetes mellitus with hyperglycemia, without long-term current use of insulin (HCC) A1c 7.2 2 months ago.   -Continue Semglee 8 units nightly -Continue sliding scale insulin coverage.  Hold home metformin and glipizide CBG (last 3)  Recent Labs    09/15/23 2111 09/16/23 0902 09/16/23 1249  GLUCAP 148* 162* 193*    HTN (hypertension) BP controlled Continue metoprolol  Chronic kidney disease, stage 3a (HCC) Renal function at baseline    Latest Ref Rng & Units 09/16/2023    4:20 AM 09/15/2023    2:35 AM 09/14/2023    2:58 AM  BMP  Glucose 70 - 99 mg/dL 161  096  045   BUN 8 - 23 mg/dL 51  55  52   Creatinine 0.61 - 1.24 mg/dL 4.09  8.11  9.14   Sodium 135 - 145 mmol/L 138  134  137   Potassium 3.5 - 5.1 mmol/L 3.7  3.4  3.5   Chloride 98 - 111 mmol/L 104  102  104   CO2 22 - 32 mmol/L 23  23  23    Calcium 8.9 - 10.3 mg/dL 8.8  8.6  8.8     Anemia due to chronic kidney disease Hemoglobin at baseline     Latest Ref Rng & Units 09/16/2023    4:20 AM 09/15/2023    2:35 AM 09/14/2023    2:58 AM  CBC  WBC 4.0 - 10.5 K/uL 8.1  8.7  9.8   Hemoglobin 13.0 - 17.0 g/dL 78.2  95.6  21.3   Hematocrit 39.0 - 52.0 % 33.1  32.6  33.4   Platelets 150 - 400 K/uL 252  236  267         Subjective: Breathing continues to improve, has some neck pain which she attributes to positioning in the OR.  Continues to have some chest congestion.  He states that Mucinex is helping with his symptoms.   physical Exam: Vitals:   09/16/23 0850 09/16/23 1155 09/16/23 1200 09/16/23 1315  BP: 137/65 119/65 121/65   Pulse: 84  90   Resp:   15   Temp:    98.7 F (37.1 C)  TempSrc:    Oral  SpO2:   95%   Weight:      Height:       Physical Exam  Constitutional: In no distress.  Cardiovascular: Normal rate, regular rhythm. 2+ edema of BLE, stable  Pulmonary: Non labored breathing on room air, no wheezing or rales.  Abdominal: Soft. Normal bowel sounds. Non distended and non tender Musculoskeletal: Normal range of motion. Bilateral feet bandaged, c/d/I  Neurological: Alert and oriented to  person, place, and time. Non focal  Skin: Skin is warm and dry.   Data Reviewed:  I have reviewed all labs and tests     Latest Ref Rng & Units 09/16/2023    4:20 AM 09/15/2023    2:35 AM 09/14/2023    2:58 AM  BMP  Glucose 70 - 99 mg/dL 086  578  469   BUN 8 - 23 mg/dL 51  55  52   Creatinine 0.61 - 1.24 mg/dL 6.29  5.28  4.13   Sodium 135 - 145 mmol/L 138  134  137   Potassium 3.5 - 5.1 mmol/L 3.7  3.4  3.5   Chloride 98 - 111 mmol/L 104  102  104   CO2 22 - 32 mmol/L 23  23  23    Calcium 8.9 -  10.3 mg/dL 8.8  8.6  8.8       Latest Ref Rng & Units 09/16/2023    4:20 AM 09/15/2023    2:35 AM 09/14/2023    2:58 AM  CBC  WBC 4.0 - 10.5 K/uL 8.1  8.7  9.8   Hemoglobin 13.0 - 17.0 g/dL 19.1  47.8  29.5   Hematocrit 39.0 - 52.0 % 33.1  32.6  33.4   Platelets 150 - 400 K/uL 252  236  267      Family Communication: Daughter is at bedside  Disposition: Status is: Inpatient Remains inpatient appropriate because:  HF exacerbation   Planned Discharge Destination:  Pending     Time spent: 35 minutes  Author: Marolyn Haller, MD 09/16/2023 3:16 PM  For on call review [email or web address].

## 2023-09-16 NOTE — Evaluation (Signed)
Physical Therapy Evaluation Patient Details Name: Tyler [name] MRN: [id number] DOB: 09/09/1946 Today's Date: 09/16/2023  History of Present Illness  Pt is a 77 y.o. male presenting to hospital from heart failure clinic d/t SOB, 10 pound weight gain last 2 weeks, worsening B LE edema.  Pt noted with wounds on B feet.  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  09/13/23 s/p R LE angio with stent placement.  09/15/23 s/p R hallux amputation and I&D L foot.   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  Clinical Impression  PT/OT co-evaluation performed.  Prior to recent medical concerns, pt was independent with ambulation; lives on 2nd floor with 13 STE with L railing.  Pt sitting in recliner upon PT/OT arrival (pt reports he stood up and walked to recliner with walker use).  Pt educated on WB'ing/LE precautions and slideboard transfer technique.  Pt initially declining slideboard transfer so tried lateral scoot transfer instead (vc's for maintaining precautions) but pt unable (pt stated it would be easier to just stand up and transfer to bed with walker despite precautions).  Pt then performed slide-board transfer with CGA plus 2nd assist for safety (vc's for precautions/technique).  Podiatry updated on pt's status and Podiatry then placed order for B post op shoes.  Pt would currently benefit from skilled PT to address noted impairments and functional limitations (see below for any additional details).  Upon hospital discharge, pt would benefit from ongoing therapy.     If plan is discharge home, recommend the following: A little help with walking and/or transfers;A little help with bathing/dressing/bathroom;Assist for  transportation;Help with stairs or ramp for entrance   Can travel by private vehicle   No    Equipment Recommendations BSC/3in1;Wheelchair (measurements PT);Wheelchair cushion (measurements PT);Other (comment) (slideboard)  Recommendations for Other Services       Functional Status Assessment Patient has had a recent decline in their functional status and demonstrates the ability to make significant improvements in function in a reasonable and predictable amount of time.     Precautions / Restrictions Precautions Precautions: Fall Precaution Comments: B post op shoes Restrictions Weight Bearing Restrictions Per Provider Order: Yes RLE Weight Bearing Per Provider Order: Partial weight bearing RLE Partial Weight Bearing Percentage or Pounds: Heel WB'ing to R foot LLE Weight Bearing Per Provider Order: Partial weight bearing LLE Partial Weight Bearing Percentage or Pounds: Minimal WB'ing to L foot with most of weight toward heel      Mobility  Bed Mobility Overal bed mobility: Needs Assistance Bed Mobility: Sit to Supine       Sit to supine: Supervision   General bed mobility comments: increased effort to perform on own    Transfers Overall transfer level: Needs assistance Equipment used: Sliding board Transfers: Bed to chair/wheelchair/BSC            Lateral/Scoot Transfers: Contact guard assist, With slide board, +2 safety/equipment General transfer comment: attempted lateral scoot (without slideboard) recliner to bed but pt reporting too difficult to perform (pt stating it would be easier to stand up to walker and transfer to bed despite precautions); slideboard transfer recliner to bed to L with vc's for technique and WB'ing precautions    Ambulation/Gait  General Gait Details: Deferred d/t WB'ing precautions  Stairs            Wheelchair Mobility     Tilt Bed    Modified Rankin (Stroke Patients Only)       Balance Overall  balance assessment: Needs assistance Sitting-balance support: No upper extremity supported, Feet supported Sitting balance-Leahy Scale: Good Sitting balance - Comments: steady reaching within BOS                                     Pertinent Vitals/Pain Pain Assessment Pain Assessment: 0-10 Pain Score: 10-Worst pain ever Pain Location: 10/10 neck pain; 3/10 B foot pain Pain Descriptors / Indicators: Aching, Tender, Sore, Discomfort Pain Intervention(s): Limited activity within patient's tolerance, Monitored during session, Repositioned, Patient requesting pain meds-RN notified (OT messaged nurse regarding pt's pain and request for pain meds) Vitals (HR and SpO2 on room air) stable and WFL throughout treatment session.     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 ADLs Comments: Independent; (+) driving     Extremity/Trunk Assessment   Upper Extremity Assessment Upper Extremity Assessment: Overall WFL for tasks assessed    Lower Extremity Assessment Lower Extremity Assessment: Generalized weakness (at least 3/5 AROM hip flexion, knee flexion/extension, and DF)    Cervical / Trunk Assessment Cervical / Trunk Assessment: Other exceptions Cervical / Trunk Exceptions: forward head/shoulders  Communication   Communication Communication: No apparent difficulties Cueing Techniques: Verbal cues  Cognition Arousal: Alert Behavior During Therapy: WFL  for tasks assessed/performed Overall Cognitive Status: Within Functional Limits for tasks assessed                                          General Comments General comments (skin integrity, edema, etc.): B feet ace wrapped    Exercises     Assessment/Plan    PT Assessment Patient needs continued PT services  PT Problem List Decreased strength;Decreased activity tolerance;Decreased balance;Decreased mobility;Decreased knowledge of use of DME;Decreased knowledge of precautions;Decreased skin integrity;Pain       PT Treatment Interventions DME instruction;Functional mobility training;Therapeutic activities;Therapeutic exercise;Balance training;Patient/family education;Wheelchair mobility training    PT Goals (Current goals can be found in the Care Plan section)  Acute Rehab PT Goals Patient Stated Goal: to go home PT Goal Formulation: With patient Time For Goal Achievement: 09/30/23 Potential to Achieve Goals: Fair    Frequency Min 1X/week     Co-evaluation PT/OT/SLP Co-Evaluation/Treatment: Yes Reason for Co-Treatment: For patient/therapist safety;To address functional/ADL transfers PT goals addressed during session: Mobility/safety with mobility;Proper use of DME OT goals addressed during session: ADL's and self-care       AM-PAC PT "6 Clicks" Mobility  Outcome Measure Help needed turning from your back to your side while in a flat bed without using bedrails?: None Help needed moving from lying on your back to  sitting on the side of a flat bed without using bedrails?: A Little Help needed moving to and from a bed to a chair (including a wheelchair)?: A Little Help needed standing up from a chair using your arms (e.g., wheelchair or bedside chair)?:  (Deferred d/t WB'ing/LE precautions) Help needed to walk in hospital room?:  (Deferred d/t WB'ing/LE precautions) Help needed climbing 3-5 steps with a railing? :  (Deferred d/t WB'ing/LE precautions) 6  Click Score: 10    End of Session   Activity Tolerance: Patient tolerated treatment well Patient left: in bed;with call bell/phone within reach;with bed alarm set;with nursing/sitter in room;Other (comment) (B LE's elevated on pillows) Nurse Communication: Mobility status;Precautions;Weight bearing status;Patient requests pain meds (precautions/WB'ing status written on white board) PT Visit Diagnosis: Other abnormalities of gait and mobility (R26.89);Muscle weakness (generalized) (M62.81);Pain Pain - Right/Left:  (B) Pain - part of body: Ankle and joints of foot    Time: 4010-2725 PT Time Calculation (min) (ACUTE ONLY): 33 min   Charges:   PT Evaluation $PT Eval Low Complexity: 1 Low   PT General Charges $$ ACUTE PT VISIT: 1 Visit        Hendricks Limes, PT 09/16/23, 1:09 PM

## 2023-09-17 DIAGNOSIS — I509 Heart failure, unspecified: Secondary | ICD-10-CM

## 2023-09-17 LAB — SURGICAL PATHOLOGY

## 2023-09-17 LAB — AEROBIC CULTURE W GRAM STAIN (SUPERFICIAL SPECIMEN): Gram Stain: NONE SEEN

## 2023-09-17 LAB — CBC
HCT: 28.6 % — ABNORMAL LOW (ref 39.0–52.0)
Hemoglobin: 9.1 g/dL — ABNORMAL LOW (ref 13.0–17.0)
MCH: 26.8 pg (ref 26.0–34.0)
MCHC: 31.8 g/dL (ref 30.0–36.0)
MCV: 84.1 fL (ref 80.0–100.0)
Platelets: 231 10*3/uL (ref 150–400)
RBC: 3.4 MIL/uL — ABNORMAL LOW (ref 4.22–5.81)
RDW: 15.9 % — ABNORMAL HIGH (ref 11.5–15.5)
WBC: 7.4 10*3/uL (ref 4.0–10.5)
nRBC: 0 % (ref 0.0–0.2)

## 2023-09-17 LAB — BASIC METABOLIC PANEL
Anion gap: 8 (ref 5–15)
BUN: 49 mg/dL — ABNORMAL HIGH (ref 8–23)
CO2: 23 mmol/L (ref 22–32)
Calcium: 8.6 mg/dL — ABNORMAL LOW (ref 8.9–10.3)
Chloride: 104 mmol/L (ref 98–111)
Creatinine, Ser: 1.13 mg/dL (ref 0.61–1.24)
GFR, Estimated: >60 mL/min (ref >60)
Glucose, Bld: 141 mg/dL — ABNORMAL HIGH (ref 70–99)
Potassium: 3.4 mmol/L — ABNORMAL LOW (ref 3.5–5.1)
Sodium: 135 mmol/L (ref 135–145)

## 2023-09-17 LAB — GLUCOSE, CAPILLARY
Glucose-Capillary: 153 mg/dL — ABNORMAL HIGH (ref 70–99)
Glucose-Capillary: 181 mg/dL — ABNORMAL HIGH (ref 70–99)
Glucose-Capillary: 161 mg/dL — ABNORMAL HIGH (ref 70–99)
Glucose-Capillary: 143 mg/dL — ABNORMAL HIGH (ref 70–99)

## 2023-09-17 MED ORDER — POTASSIUM CHLORIDE 20 MEQ PO PACK
40.0000 meq | PACK | Freq: Once | ORAL | Status: AC
  Administered 2023-09-17: 40 meq via ORAL
  Filled 2023-09-17: qty 2

## 2023-09-17 MED ORDER — FINERENONE 10 MG PO TABS: 10.0000 mg | ORAL_TABLET | Freq: Every day | ORAL | Status: DC

## 2023-09-17 NOTE — Progress Notes (Signed)
Progress Note   Patient: Tyler [name] ZOX:[id number] DOB: August 11, 1946 DOA: 09/11/2023     6 DOS: the patient was seen and examined on 09/17/2023  Brief hospital course: Tyler [name] is a 77 y.o. 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  He presented to the hospital because of cough productive of yellowish phlegm, increasing shortness of breath, orthopnea, easy fatigability, about 10 pound weight gain in a week.  He was hospitalized on 08/13/2023 for CHF exacerbation.  His diuretic regimen had been adjusted in the outpatient setting but his symptoms did not improve. He also complained of oozing from the wounds on the bottom of his feet.   He was admitted to the hospital for acute exacerbation of chronic systolic CHF, community-acquired pneumonia, infected plantar ulcers on bilateral feet and right foot osteomyelitis.  Dr. Annamary Rummage, podiatrist, was consulted for right foot osteomyelitis.  He recommended MRI of the foot for further evaluation.  MRI of the left foot showed plantar surface ulcer without evidence of osteomyelitis or abscess.  MRI of the right foot showed septic arthritis of right hallux IPJ, and OM of the proximal and distal phalanx.  Podiatry was consulted who recommended right hallux amputation versus ray resection and left foot ulcer debridement.  Patient was also seen by vascular surgery who performed right lower extremity arteriogram.  Patient underwent angioplasty and stent placement of right SFA and popliteal arteries 12/20.   Assessment and Plan:  Acute on chronic heart failure with reduced ejection fraction (HFrEF, 30-35 %) (HCC) Nonischemic  cardiomyopathy Bilateral pleural effusions Moderate mitral regurgitation Patient failing outpatient management Cardiology consulted appreciate recommendations Last echo from October with EF 30 to 35% Continue diuresis as recommended by cardiologist Patient previously on Entresto but did not tolerate it  Continue GDMT with metoprolol and Kerendia Continue Daily weights with intake and output monitoring     CAP (community acquired pneumonia) Patient with several week history of productive cough Patient with low-grade temp of 99 leukocytosis of 11,000 chest x-ray showing right upper lobe opacity suspicious for pneumonia Received Rocephin and azithromycin in the ED Has completed antibiotic course    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  S/p R great toe amp and L foot wound I&D w/ wound graft with podiatry 12/22 - Per podiatry " -WB Status: May be heel weight bearing on the right foot and partial minimal weight bearing on the left with most weight toward to heel. . -Medications/ABX: 2 weeks doxycyline on discharge -Dressing to remain c/d/I until follow up in 1 weeks - Return in 1 week to [place] office on 12/30 with Dr. Ralene Cork."    H/o Persistent atrial fibrillation (HCC) Continue metoprolol Patient off of Eliquis due to hematuria. (per review of heart failure clinic note from earlier in the day) Patient has been previously referred for ablation Continue aspirin   Complete heart block (HCC)   s/p PACEMAKER In 2021. No acute issues   Uncontrolled type 2 diabetes mellitus with hyperglycemia, without long-term current use of insulin (HCC) A1c 7.2 2 months ago.  Continue insulin therapy   HTN (hypertension) BP controlled Continue metoprolol  Chronic kidney disease, stage 3a (HCC) Renal function at baseline  Anemia due to chronic kidney  disease Hemoglobin at baseline    Subjective:  Patient seen and examined at bedside this morning Still continues to be on 2 L of intranasal oxygen .  Actively being diuresed with IV Bumex Denies chest pain nausea vomiting abdominal pain   physical Exam:  Constitutional: In no distress.  Cardiovascular: Normal rate, regular rhythm. 2+ edema of BLE, stable  Pulmonary: Non labored breathing on room air, no wheezing or rales.  Abdominal: Soft. Normal bowel sounds. Non distended and non tender Musculoskeletal: Normal range of motion. Bilateral feet bandaged, c/d/I  Neurological: Alert and oriented to person, place, and time. Non focal  Skin: Skin is warm and dry.    Family Communication: Daughter is at bedside   Disposition: Status is: Inpatient Remains inpatient appropriate because:  HF exacerbation  Planned Discharge Destination:  Pending   Data Reviewed:      Latest Ref Rng & Units 09/17/2023    3:10 AM 09/16/2023    4:20 AM 09/15/2023    2:35 AM  BMP  Glucose 70 - 99 mg/dL 696  295  284   BUN 8 - 23 mg/dL 49  51  55   Creatinine 0.61 - 1.24 mg/dL 1.32  4.40  1.02   Sodium 135 - 145 mmol/L 135  138  134   Potassium 3.5 - 5.1 mmol/L 3.4  3.7  3.4   Chloride 98 - 111 mmol/L 104  104  102   CO2 22 - 32 mmol/L 23  23  23    Calcium 8.9 - 10.3 mg/dL 8.6  8.8  8.6     I have reviewed all labs and tests      Latest Ref Rng & Units 09/17/2023    3:10 AM 09/16/2023    4:20 AM 09/15/2023    2:35 AM  CBC  WBC 4.0 - 10.5 K/uL 7.4  8.1  8.7   Hemoglobin 13.0 - 17.0 g/dL 9.1  72.5  36.6   Hematocrit 39.0 - 52.0 % 28.6  33.1  32.6   Platelets 150 - 400 K/uL 231  252  236       Vitals:   09/17/23 0934 09/17/23 1231 09/17/23 1600 09/17/23 1639  BP: (!) 116/53 119/66 102/87 129/72  Pulse: 85 89 80 82  Resp:  15 19 20   Temp:    98.5 F (36.9 C)  TempSrc:    Oral  SpO2:  100% 100% 95%  Weight:      Height:         Time spent: 40 minutes  Author: Loyce Dys,  MD 09/17/2023 5:59 PM  For on call review [email or web address].

## 2023-09-17 NOTE — Plan of Care (Signed)
  Problem: Education: Goal: Ability to describe self-care measures that may prevent or decrease complications (Diabetes Survival Skills Education) will improve Outcome: Progressing   Problem: Skin Integrity: Goal: Risk for impaired skin integrity will decrease Outcome: Progressing   Problem: Pain Management: Goal: General experience of comfort will improve Outcome: Progressing   Problem: Safety: Goal: Ability to remain free from injury will improve Outcome: Progressing   Problem: Skin Integrity: Goal: Risk for impaired skin integrity will decrease Outcome: Progressing

## 2023-09-17 NOTE — Progress Notes (Signed)
PT Cancellation Note  Patient Details Name: Tyler [name] MRN: [id number] DOB: 03/09/1946   Cancelled Treatment:    Reason Eval/Treat Not Completed: Patient declined, no reason specified. Pt sitting upright at EOB finishing a wash up upon PT arrival. Pt declining any OOB mobility at this time as he has not yet received his post-op shoes. Pt stating if he had those shoes that he would be able to stand and walk around the room with the walker without difficulties. PT will continue to follow-up with pt acutely as available and appropriate.   [name] 09/17/2023, 11:57 AM

## 2023-09-17 NOTE — Plan of Care (Signed)
  Problem: Metabolic: Goal: Ability to maintain appropriate glucose levels will improve Outcome: Progressing   Problem: Nutritional: Goal: Maintenance of adequate nutrition will improve Outcome: Progressing   Problem: Skin Integrity: Goal: Risk for impaired skin integrity will decrease Outcome: Progressing   Problem: Activity: Goal: Capacity to carry out activities will improve Outcome: Progressing   Problem: Cardiac: Goal: Ability to achieve and maintain adequate cardiopulmonary perfusion will improve Outcome: Progressing

## 2023-09-17 NOTE — Progress Notes (Signed)
Heart Failure Stewardship Pharmacy Note  PCP: Elder Negus, NP PCP-Cardiologist: None  HPI: Tyler [name] is a 77 y.o. male with HTN, HLD, DM, PVD, SSS (s/p PPM), dCHF, CKD-3A, obesity, rheumatic fever, atrial fibrillation not on anticoagulants, chronic diabetic foot ulcers, s/p third metatarsal head resection with bone biopsy right foot who presented with worsening dyspnea, weight gain, PND, and orthopnea. Has been ED visits this year. Troponin on admission was 18. BNP on admission was elevated at 3191.5 CXR on admission showed stable vascular congestion. Underwent right great toe amputation with I&D and wound graft on 09/15/23.    Pertinent cardiac history: Long term holter in 03/2020 showed 6.1% PVC burden and multiple AF episodes. Ablation performed and pacemaker placed in 08/2020. Stress test 03/2023 showed no evidence of ischemia and LVEF of 57%. TTE in 08/2020 and 09/2020 showed LVEF of 55-60%. Echo 07/03/23 showed LVEF of 30-35% and moderate MR.   Pertinent Lab Values: Creatinine  Date Value Ref Range Status  07/10/2014 1.26 0.60 - 1.30 mg/dL Final   Creatinine, Ser  Date Value Ref Range Status  09/12/2023 1.45 (H) 0.61 - 1.24 mg/dL Final   BUN  Date Value Ref Range Status  09/12/2023 51 (H) 8 - 23 mg/dL Final  40/98/1191 10 7 - 18 mg/dL Final   Potassium  Date Value Ref Range Status  09/12/2023 4.2 3.5 - 5.1 mmol/L Final  07/10/2014 3.7 3.5 - 5.1 mmol/L Final   Sodium  Date Value Ref Range Status  09/12/2023 135 135 - 145 mmol/L Final  07/10/2014 142 136 - 145 mmol/L Final   B Natriuretic Peptide  Date Value Ref Range Status  09/11/2023 3,874.2 (H) 0.0 - 100.0 pg/mL Final    Comment:    Performed at [facility], [address] [place], [place] [address]   Magnesium  Date Value Ref Range Status  08/17/2023 2.3 1.7 - 2.4 mg/dL Final    Comment:    Performed at [facility], [address] [place], [place] [address]  07/10/2014 1.8 mg/dL  Final    Comment:    0.8-6.5 THERAPEUTIC RANGE: 4-7 mg/dL TOXIC: > 10 mg/dL  -----------------------    Hemoglobin A1C  Date Value Ref Range Status  07/05/2014 10.4 (H) 4.2 - 6.3 % Final    Comment:    The American Diabetes Association recommends that a primary goal of therapy should be <7% and that physicians should reevaluate the treatment regimen in patients with HbA1c values consistently >8%. Hemoglobin A1c - ** THIS IS A CORRECTED REPORT **  - PLEASE DISREGARD PREVIOUS RESULT.  - Corrected report called to and read back  - by Crista Elliot on 1C @ 1155 07/06/14.  - BGB    Hgb A1c MFr Bld  Date Value Ref Range Status  07/02/2023 7.2 (H) 4.8 - 5.6 % Final    Comment:    (NOTE) Pre diabetes:          5.7%-6.4%  Diabetes:              >6.4%  Glycemic control for   <7.0% adults with diabetes    TSH  Date Value Ref Range Status  06/28/2020 1.726 0.350 - 4.500 uIU/mL Final    Comment:    Performed by a 3rd Generation assay with a functional sensitivity of <=0.01 uIU/mL. Performed at [facility], [address] [place], [place] [address]     Vital Signs: Admission weight: none Temp:  [97.7 F (36.5 C)-98.7 F (37.1  C)] 98.1 F (36.7 C) (12/24 0306) Pulse Rate:  [84-90] 86 (12/23 1929) Cardiac Rhythm: Ventricular paced (12/23 1900) Resp:  [15-20] 18 (12/23 1929) BP: (111-137)/(61-69) 125/61 (12/24 0306) SpO2:  [95 %-98 %] 98 % (12/23 1600) Weight:  [111.6 kg (246 lb 0.5 oz)] 111.6 kg (246 lb 0.5 oz) (12/24 0500)  Intake/Output Summary (Last 24 hours) at 09/17/2023 0739 Last data filed at 09/17/2023 0700 Gross per 24 hour  Intake 240 ml  Output 5075 ml  Net -4835 ml   Current Heart Failure Medications:  Loop diuretic: bumetanide 2 mg IV Q8H Beta-Blocker: metoprolol succinate 50 mg daily ACEI/ARB/ARNI: none MRA: none SGLT2i: none   Prior to admission Heart Failure Medications:  Loop diuretic: bumetanide 2 mg po daily Beta-Blocker:  metoprolol succinate 50 mg daily ACEI/ARB/ARNI: none MRA: none SGLT2i: none Other: metolazone 2.5 mg twice weekly   Assessment: 1. 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 NYHA class III symptoms.  -Symptoms: Shortness of breath has improved. LEE is present at baseline due to lymphedema, but appears to be going down. Reports mucous production.  -Volume: Still appears to be mildly hypervolemic on exam. Bumetanide at 2 mg IV q8h. Creatinine has improved with diuresis. Weight today is down 14 lbs. Excellent urine output. -Hemodynamics: BP improved to/ 120-130s/70-80s with HR in 80-90s. -BB: Previously did not tolerate carvedilol due to blurred vision, now on metoprolol succinate 50 mg daily.  -ACE/ARB/ARNI: Did not tolerate Entresto due to dizziness. Can consider restarting losartan. -MRA: Finerenone reordered inpatient. Since this is not on formulary, patient would need to provide supply. -SGLT2i: Not a candidate given chronic foley catheter.  -Given AF and CHADSVASc >3, may benefit from chronic anticoagulation, however with history of bleeding, patient insisted staying off anticoagulation.    Plan: 1) Medication changes recommended at this time: -Consider restarting losartan 25 mg daily  2) Patient assistance: -Sherryll Burger and [name] are $0   3) Education: - Patient has been educated on current HF medications and potential additions to HF medication regimen - Patient verbalizes understanding that over the next few months, these medication doses may change and more medications may be added to optimize HF regimen - Patient has been educated on basic disease state pathophysiology and goals of therapy   Medication Assistance / Insurance Benefits Check: Does the patient have prescription insurance? Prescription Insurance: Commercial   Type of insurance plan:     Outpatient Pharmacy: Prior to admission outpatient pharmacy: [facility] Please do not hesitate to  reach out with questions or concerns,   Enos Fling, PharmD, CPP, BCPS Heart Failure Pharmacist  Phone - [phone] 08/16/2023 7:35 AM

## 2023-09-17 NOTE — TOC Initial Note (Signed)
Transition of Care [facility]) - Initial/Assessment Note    Patient Details  Name: Tyler [name] MRN: [id number] Date of Birth: Apr 22, 1946  Transition of Care [facility] CM/SW Contact:    Margarito Liner, LCSW Phone Number: 09/17/2023, 2:05 PM  Clinical Narrative: CSW met with patient. No supports at bedside. CSW introduced role and explained that therapy recommendations would be discussed. Patient declined SNF placement and home health. He plans to stay with his daughter for a couple of days at discharge and then return home and back to work. Will determine DME needs closer to discharge. No further concerns. CSW encouraged patient to contact CSW as needed. CSW will continue to follow patient for support and facilitate return home once stable.                 Expected Discharge Plan: Home/Self Care Barriers to Discharge: Continued Medical Work up   Patient Goals and CMS Choice            Expected Discharge Plan and Services     Post Acute Care Choice: NA Living arrangements for the past 2 months: Single Family Home Expected Discharge Date: 09/12/23                                    Prior Living Arrangements/Services Living arrangements for the past 2 months: Single Family Home Lives with:: Friends Patient language and need for interpreter reviewed:: Yes Do you feel safe going back to the place where you live?: Yes      Need for Family Participation in Patient Care: Yes (Comment) Care giver support system in place?: Yes (comment) Current home services: DME Criminal Activity/Legal Involvement Pertinent to Current Situation/Hospitalization: No - Comment as needed  Activities of Daily Living   ADL Screening (condition at time of admission) Independently performs ADLs?: No (room mate helps him "get along") Is the patient deaf or have difficulty hearing?: No Does the patient have difficulty seeing, even when wearing glasses/contacts?: No Does the patient have difficulty  concentrating, remembering, or making decisions?: No  Permission Sought/Granted                  Emotional Assessment Appearance:: Appears stated age Attitude/Demeanor/Rapport: Engaged, Gracious Affect (typically observed): Accepting, Appropriate, Calm, Pleasant Orientation: : Oriented to Self, Oriented to Place, Oriented to  Time, Oriented to Situation Alcohol / Substance Use: Not Applicable Psych Involvement: No (comment)  Admission diagnosis:  CHF exacerbation (HCC) [I50.9] Acute on chronic congestive heart failure, unspecified heart failure type [facility]) [I50.9] Patient Active Problem List   Diagnosis Date Noted   Diabetic ulcer of left midfoot associated with type 2 diabetes mellitus, with fat layer exposed (HCC) 09/15/2023   Osteomyelitis of great toe of right foot (HCC) 09/13/2023   Infected plantar ulcers both feet (HCC) 09/12/2023   Osteomyelitis of right foot (HCC) 09/12/2023   CAP (community acquired pneumonia) 09/12/2023   Moderate mitral regurgitation by prior echocardiogram 09/11/2023   Nonischemic cardiomyopathy (HCC) 09/11/2023   Status cardiac pacemaker 09/11/2023   Anemia due to chronic kidney disease 09/11/2023   Uncontrolled type 2 diabetes mellitus with hyperglycemia, without long-term current use of insulin (HCC) 09/11/2023   Chronic bilateral pleural effusions 09/11/2023   Complete heart block (HCC)  s/p PACEMAKER 09/11/2023   Type 2 diabetes mellitus with chronic kidney disease, without long-term current use of insulin (HCC) 08/14/2023   Acute on chronic heart failure with reduced  ejection fraction (HFrEF,30-35 %) (HCC) 08/13/2023   Ulcerated, foot, right, with fat layer exposed (HCC) 07/06/2023   Ulcer of left foot with fat layer exposed (HCC) 07/06/2023   Acute on chronic diastolic CHF (congestive heart failure) (HCC) 07/02/2023   Type II diabetes mellitus with renal manifestations (HCC) 07/02/2023   Chronic kidney disease, stage 3a (HCC) 07/02/2023    Myocardial injury 07/02/2023   Cellulitis of right lower extremity 07/02/2023   Epistaxis 11/05/2022   Foot infection 11/03/2022   Cellulitis of right foot 10/31/2022   Type 2 diabetes mellitus with complication, without long-term current use of insulin (HCC) 10/31/2022   Persistent atrial fibrillation (HCC) 10/31/2022   HTN (hypertension) 10/31/2022   CKD (chronic kidney disease), stage III (HCC) 10/31/2022   Obesity (BMI 30-39.9) 10/31/2022   Gangrene of toe of right foot (HCC) 10/31/2022   PAD (peripheral artery disease) (HCC) 10/31/2022   Right foot infection 10/30/2022   Syncope and collapse 06/28/2020   Acute on chronic congestive heart failure (HCC) 06/28/2020   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   Diabetes mellitus without complication (HCC)    Sepsis (HCC) 07/03/2018   PCP:  Elder Negus, NP Pharmacy:   [facility] [phone] [address] [address], [place] - [address] [place] [address] Phone: [phone] Fax: [phone]  [facility] [address] [place] [address] Phone: [phone] Fax: [phone]     Social Drivers of Health (SDOH) Social History: SDOH Screenings   Food Insecurity: No Food Insecurity (09/13/2023)  Housing: Patient Declined (09/14/2023)  Transportation Needs: Patient Declined (09/14/2023)  Utilities: Patient Declined (09/14/2023)  Alcohol Screen: Low Risk  (08/14/2023)  Financial Resource Strain: Low Risk  (03/20/2023)   Received from [facility]  Social Connections: Unknown (02/01/2022)   Received from [facility]  Stress: No Stress Concern Present (09/07/2020)   Received from [facility]  Tobacco Use: Low Risk  (09/11/2023)   SDOH Interventions:     Readmission Risk Interventions    09/12/2023   12:13 PM 08/15/2023    3:57 PM  Readmission Risk Prevention Plan  Transportation Screening Complete Complete  PCP or  Specialist Appt within 3-5 Days Complete Complete  HRI or Home Care Consult Complete Complete  Social Work Consult for Recovery Care Planning/Counseling Complete Complete  Palliative Care Screening Not Applicable Not Applicable  Medication Review Oceanographer) Complete Complete

## 2023-09-17 NOTE — Progress Notes (Signed)
[facility]  SUBJECTIVE: Patient laying in bed, reports feeling better, less shortness of breath   Vitals:   09/16/23 1929 09/16/23 2349 09/17/23 0306 09/17/23 0500  BP:  119/67 125/61   Pulse: 86     Resp: 18     Temp: 98 F (36.7 C) 98 F (36.7 C) 98.1 F (36.7 C)   TempSrc: Oral Oral Oral   SpO2:      Weight:    111.6 kg  Height:         Intake/Output Summary (Last 24 hours) at 09/17/2023 0746 Last data filed at 09/17/2023 0700 Gross per 24 hour  Intake 240 ml  Output 5075 ml  Net -4835 ml      PHYSICAL EXAM  General: Well developed, well nourished, in no acute distress HEENT:  Normocephalic and atramatic Neck:  No JVD.  Lungs: Clear bilaterally to auscultation and percussion. Heart: HRRR . Normal S1 and S2 without gallops or murmurs.  Abdomen: Bowel sounds are positive, abdomen soft and non-tender  Msk:  Back normal, normal gait. Normal strength and tone for age. Extremities: No clubbing, cyanosis or edema.   Neuro: Alert and oriented X 3. Psych:  Good affect, responds appropriately   LABS: Basic Metabolic Panel: Recent Labs    09/15/23 0235 09/16/23 0420 09/17/23 0310  NA 134* 138 135  K 3.4* 3.7 3.4*  CL 102 104 104  CO2 23 23 23   GLUCOSE 214* 160* 141*  BUN 55* 51* 49*  CREATININE 1.32* 1.37* 1.13  CALCIUM 8.6* 8.8* 8.6*  MG 2.2 2.0  --    Liver Function Tests: No results for input(s): "AST", "ALT", "ALKPHOS", "BILITOT", "PROT", "ALBUMIN" in the last 72 hours. No results for input(s): "LIPASE", "AMYLASE" in the last 72 hours. CBC: Recent Labs    09/16/23 0420 09/17/23 0310  WBC 8.1 7.4  HGB 10.1* 9.1*  HCT 33.1* 28.6*  MCV 87.3 84.1  PLT 252 231   Cardiac Enzymes: No results for input(s): "CKTOTAL", "CKMB", "CKMBINDEX", "TROPONINI" in the last 72 hours. BNP: Invalid input(s): "POCBNP" D-Dimer: No results for input(s): "DDIMER" in the last 72 hours. Hemoglobin A1C: No results for input(s): "HGBA1C" in the last 72  hours. Fasting Lipid Panel: No results for input(s): "CHOL", "HDL", "LDLCALC", "TRIG", "CHOLHDL", "LDLDIRECT" in the last 72 hours. Thyroid Function Tests: No results for input(s): "TSH", "T4TOTAL", "T3FREE", "THYROIDAB" in the last 72 hours.  Invalid input(s): "FREET3" Anemia Panel: No results for input(s): "VITAMINB12", "FOLATE", "FERRITIN", "TIBC", "IRON", "RETICCTPCT" in the last 72 hours.  DG Foot 2 Views Right Result Date: 09/15/2023 CLINICAL DATA:  Right foot osteomyelitis. EXAM: RIGHT FOOT - 2 VIEW COMPARISON:  MRI 09/12/2023.  X-ray 09/11/2023 FINDINGS: Interval great toe amputation at the level of the MTP joint. Stable appearance third toe amputation at the mid metatarsal level with evidence of prior resection of the fourth metatarsal head. IMPRESSION: Interval great toe amputation at the level of the MTP joint. Electronically Signed   By: Kennith Center M.D.   On: 09/15/2023 14:04     Echo EF 30-35% moderate mitral regurgitation 07/03/2023  TELEMETRY: She was sensing with ventricular pacing at 89 bpm:  ASSESSMENT AND PLAN:  Active Problems:   Acute on chronic congestive heart failure (HCC)   Persistent atrial fibrillation (HCC)   HTN (hypertension)   Chronic kidney disease, stage 3a (HCC)   Acute on chronic heart failure with reduced ejection fraction (HFrEF,30-35 %) (HCC)   Moderate mitral regurgitation by prior echocardiogram   Nonischemic  cardiomyopathy (HCC)   Status cardiac pacemaker   Anemia due to chronic kidney disease   Uncontrolled type 2 diabetes mellitus with hyperglycemia, without long-term current use of insulin (HCC)   Chronic bilateral pleural effusions   Complete heart block (HCC)  s/p PACEMAKER   Infected plantar ulcers both feet (HCC)   Osteomyelitis of right foot (HCC)   CAP (community acquired pneumonia)   Osteomyelitis of great toe of right foot (HCC)   Diabetic ulcer of left midfoot associated with type 2 diabetes mellitus, with fat layer exposed  (HCC)    1.  Acute on chronic HFrEF, likely improving with diuresis, on bumetanide 2 mg IV every 8 hours, on metoprolol succinate and finerenone, unable to tolerate Entresto 2.  Chronic atrial fibrillation, previously on Eliquis, discontinued due to hematuria, on metoprolol succinate for rate control 3.  Complete heart block, status post dual-chamber pacemaker 08/2020 4.  Chronic kidney disease stage IIIa, BUN and creatinine 49 and 1.139 5.  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  Elevated troponin (18, 22), likely demand supply ischemia  Recommendations  1.  Agree with current therapy 2.  Continue diuresis 3.  Carefully monitor renal status 4.  Continue good medical management (metoprolol succinate and finerenone) 5.  Consider adding ARB, could be done as outpatient (patient unable to tolerate Entresto) 6.  Defer chronic anticoagulation    Marcina Millard, MD, PhD, [facility] 09/17/2023 7:46 AM

## 2023-09-18 DIAGNOSIS — I509 Heart failure, unspecified: Secondary | ICD-10-CM | POA: Diagnosis not present

## 2023-09-18 DIAGNOSIS — I5023 Acute on chronic systolic (congestive) heart failure: Secondary | ICD-10-CM | POA: Diagnosis not present

## 2023-09-18 LAB — BASIC METABOLIC PANEL
Anion gap: 10 (ref 5–15)
BUN: 50 mg/dL — ABNORMAL HIGH (ref 8–23)
CO2: 25 mmol/L (ref 22–32)
Calcium: 8.9 mg/dL (ref 8.9–10.3)
Chloride: 102 mmol/L (ref 98–111)
Creatinine, Ser: 1.16 mg/dL (ref 0.61–1.24)
GFR, Estimated: >60 mL/min (ref >60)
Glucose, Bld: 166 mg/dL — ABNORMAL HIGH (ref 70–99)
Potassium: 3.6 mmol/L (ref 3.5–5.1)
Sodium: 137 mmol/L (ref 135–145)

## 2023-09-18 LAB — CBC WITH DIFFERENTIAL/PLATELET
Abs Immature Granulocytes: 0.04 10*3/uL (ref 0.00–0.07)
Basophils Absolute: 0.1 10*3/uL (ref 0.0–0.1)
Basophils Relative: 1 %
Eosinophils Absolute: 0.4 10*3/uL (ref 0.0–0.5)
Eosinophils Relative: 6 %
HCT: 29.8 % — ABNORMAL LOW (ref 39.0–52.0)
Hemoglobin: 9.6 g/dL — ABNORMAL LOW (ref 13.0–17.0)
Immature Granulocytes: 1 %
Lymphocytes Relative: 18 %
Lymphs Abs: 1.2 10*3/uL (ref 0.7–4.0)
MCH: 27 pg (ref 26.0–34.0)
MCHC: 32.2 g/dL (ref 30.0–36.0)
MCV: 83.7 fL (ref 80.0–100.0)
Monocytes Absolute: 0.7 10*3/uL (ref 0.1–1.0)
Monocytes Relative: 10 %
Neutro Abs: 4.4 10*3/uL (ref 1.7–7.7)
Neutrophils Relative %: 64 %
Platelets: 219 10*3/uL (ref 150–400)
RBC: 3.56 MIL/uL — ABNORMAL LOW (ref 4.22–5.81)
RDW: 15.9 % — ABNORMAL HIGH (ref 11.5–15.5)
WBC: 6.8 10*3/uL (ref 4.0–10.5)
nRBC: 0 % (ref 0.0–0.2)

## 2023-09-18 LAB — AEROBIC CULTURE W GRAM STAIN (SUPERFICIAL SPECIMEN)

## 2023-09-18 LAB — GLUCOSE, CAPILLARY
Glucose-Capillary: 192 mg/dL — ABNORMAL HIGH (ref 70–99)
Glucose-Capillary: 160 mg/dL — ABNORMAL HIGH (ref 70–99)
Glucose-Capillary: 179 mg/dL — ABNORMAL HIGH (ref 70–99)
Glucose-Capillary: 218 mg/dL — ABNORMAL HIGH (ref 70–99)

## 2023-09-18 MED ORDER — POTASSIUM CHLORIDE CRYS ER 20 MEQ PO TBCR
40.0000 meq | EXTENDED_RELEASE_TABLET | Freq: Once | ORAL | Status: AC
  Administered 2023-09-18: 40 meq via ORAL
  Filled 2023-09-18: qty 2

## 2023-09-18 NOTE — Plan of Care (Signed)
  Problem: Education: Goal: Ability to describe self-care measures that may prevent or decrease complications (Diabetes Survival Skills Education) will improve Outcome: Progressing Goal: Individualized Educational Video(s) Outcome: Progressing   Problem: Coping: Goal: Ability to adjust to condition or change in health will improve Outcome: Progressing   Problem: Fluid Volume: Goal: Ability to maintain a balanced intake and output will improve Outcome: Progressing   Problem: Health Behavior/Discharge Planning: Goal: Ability to identify and utilize available resources and services will improve Outcome: Progressing Goal: Ability to manage health-related needs will improve Outcome: Progressing   Problem: Metabolic: Goal: Ability to maintain appropriate glucose levels will improve Outcome: Progressing   Problem: Nutritional: Goal: Maintenance of adequate nutrition will improve Outcome: Progressing Goal: Progress toward achieving an optimal weight will improve Outcome: Progressing   Problem: Skin Integrity: Goal: Risk for impaired skin integrity will decrease Outcome: Progressing   Problem: Tissue Perfusion: Goal: Adequacy of tissue perfusion will improve Outcome: Progressing   Problem: Education: Goal: Ability to demonstrate management of disease process will improve Outcome: Progressing Goal: Ability to verbalize understanding of medication therapies will improve Outcome: Progressing Goal: Individualized Educational Video(s) Outcome: Progressing   Problem: Activity: Goal: Capacity to carry out activities will improve Outcome: Progressing   Problem: Cardiac: Goal: Ability to achieve and maintain adequate cardiopulmonary perfusion will improve Outcome: Progressing   Problem: Education: Goal: Understanding of CV disease, CV risk reduction, and recovery process will improve Outcome: Progressing Goal: Individualized Educational Video(s) Outcome: Progressing    Problem: Activity: Goal: Ability to return to baseline activity level will improve Outcome: Progressing   Problem: Cardiovascular: Goal: Ability to achieve and maintain adequate cardiovascular perfusion will improve Outcome: Progressing Goal: Vascular access site(s) Level 0-1 will be maintained Outcome: Progressing   Problem: Health Behavior/Discharge Planning: Goal: Ability to safely manage health-related needs after discharge will improve Outcome: Progressing   Problem: Education: Goal: Knowledge of General Education information will improve Description: Including pain rating scale, medication(s)/side effects and non-pharmacologic comfort measures Outcome: Progressing   Problem: Health Behavior/Discharge Planning: Goal: Ability to manage health-related needs will improve Outcome: Progressing   Problem: Clinical Measurements: Goal: Ability to maintain clinical measurements within normal limits will improve Outcome: Progressing Goal: Will remain free from infection Outcome: Progressing Goal: Diagnostic test results will improve Outcome: Progressing Goal: Respiratory complications will improve Outcome: Progressing Goal: Cardiovascular complication will be avoided Outcome: Progressing   Problem: Activity: Goal: Risk for activity intolerance will decrease Outcome: Progressing   Problem: Nutrition: Goal: Adequate nutrition will be maintained Outcome: Progressing   Problem: Coping: Goal: Level of anxiety will decrease Outcome: Progressing   Problem: Elimination: Goal: Will not experience complications related to bowel motility Outcome: Progressing Goal: Will not experience complications related to urinary retention Outcome: Progressing   Problem: Pain Management: Goal: General experience of comfort will improve Outcome: Progressing   Problem: Safety: Goal: Ability to remain free from injury will improve Outcome: Progressing   Problem: Skin Integrity: Goal:  Risk for impaired skin integrity will decrease Outcome: Progressing

## 2023-09-18 NOTE — Progress Notes (Signed)
SUBJECTIVE: Patient is walking around and is having some dyspnea on exertion but feels much better compared to when first arrived to the hospital.  He denies any chest pain.   Vitals:   09/17/23 1800 09/17/23 1920 09/17/23 2050 09/18/23 0806  BP:   126/82 127/76  Pulse: 91 89 98 94  Resp: (!) 37 20 (!) 21 (!) 35  Temp:   99 F (37.2 C) 97.8 F (36.6 C)  TempSrc:   Oral Oral  SpO2: 98%  97% 96%  Weight:      Height:        Intake/Output Summary (Last 24 hours) at 09/18/2023 0948 Last data filed at 09/18/2023 0000 Gross per 24 hour  Intake 243 ml  Output 2750 ml  Net -2507 ml    LABS: Basic Metabolic Panel: Recent Labs    09/16/23 0420 09/17/23 0310 09/18/23 0628  NA 138 135 137  K 3.7 3.4* 3.6  CL 104 104 102  CO2 23 23 25   GLUCOSE 160* 141* 166*  BUN 51* 49* 50*  CREATININE 1.37* 1.13 1.16  CALCIUM 8.8* 8.6* 8.9  MG 2.0  --   --    Liver Function Tests: No results for input(s): "AST", "ALT", "ALKPHOS", "BILITOT", "PROT", "ALBUMIN" in the last 72 hours. No results for input(s): "LIPASE", "AMYLASE" in the last 72 hours. CBC: Recent Labs    09/17/23 0310 09/18/23 0628  WBC 7.4 6.8  NEUTROABS  --  4.4  HGB 9.1* 9.6*  HCT 28.6* 29.8*  MCV 84.1 83.7  PLT 231 219   Cardiac Enzymes: No results for input(s): "CKTOTAL", "CKMB", "CKMBINDEX", "TROPONINI" in the last 72 hours. BNP: Invalid input(s): "POCBNP" D-Dimer: No results for input(s): "DDIMER" in the last 72 hours. Hemoglobin A1C: No results for input(s): "HGBA1C" in the last 72 hours. Fasting Lipid Panel: No results for input(s): "CHOL", "HDL", "LDLCALC", "TRIG", "CHOLHDL", "LDLDIRECT" in the last 72 hours. Thyroid Function Tests: No results for input(s): "TSH", "T4TOTAL", "T3FREE", "THYROIDAB" in the last 72 hours.  Invalid input(s): "FREET3" Anemia Panel: No results for input(s): "VITAMINB12", "FOLATE", "FERRITIN", "TIBC", "IRON", "RETICCTPCT" in the last 72 hours.   PHYSICAL EXAM General: Well  developed, well nourished, in no acute distress HEENT:  Normocephalic and atramatic Neck:  No JVD.  Lungs: Clear bilaterally to auscultation and percussion. Heart: HRRR . Normal S1 and S2 without gallops or murmurs.  Abdomen: Bowel sounds are positive, abdomen soft and non-tender  Msk:  Back normal, normal gait. Normal strength and tone for age. Extremities: No clubbing, cyanosis or edema.   Neuro: Alert and oriented X 3. Psych:  Good affect, responds appropriately  TELEMETRY: VVI paced rhythm 90/min  ASSESSMENT AND PLAN: Acute on chronic HFrEF, likely improving with diuresis, on bumetanide 2 mg IV every 8 hours, on metoprolol succinate and finerenone, unable to tolerate Entresto 2.  Chronic atrial fibrillation, previously on Eliquis, discontinued due to hematuria, on metoprolol succinate for rate control 3.  Complete heart block, status post dual-chamber pacemaker 08/2020 4.  Chronic kidney disease stage IIIa, BUN and creatinine 49 and 1.139 5.  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  Elevated troponin (18, 22), likely demand supply ischemia   Recommendations   1.  Agree with current therapy 2.  Continue diuresis 3.  Carefully monitor renal status 4.  Continue good medical management (metoprolol succinate and finerenone) 5.  Consider adding ARB, could be done as outpatient (patient unable to tolerate Entresto) 6.  Defer chronic anticoagulation   ICD-10-CM  1. Acute on chronic congestive heart failure, unspecified heart failure type (HCC)  I50.9       Active Problems:   Acute on chronic congestive heart failure (HCC)   Persistent atrial fibrillation (HCC)   HTN (hypertension)   Chronic kidney disease, stage 3a (HCC)   Acute on chronic heart failure with reduced ejection fraction (HFrEF,30-35 %) (HCC)   Moderate mitral regurgitation by prior echocardiogram   Nonischemic cardiomyopathy (HCC)   Status cardiac pacemaker   Anemia due to chronic kidney disease    Uncontrolled type 2 diabetes mellitus with hyperglycemia, without long-term current use of insulin (HCC)   Chronic bilateral pleural effusions   Complete heart block (HCC)  s/p PACEMAKER   Infected plantar ulcers both feet (HCC)   Osteomyelitis of right foot (HCC)   CAP (community acquired pneumonia)   Osteomyelitis of great toe of right foot (HCC)   Diabetic ulcer of left midfoot associated with type 2 diabetes mellitus, with fat layer exposed (HCC)    Adrian Blackwater, MD, [facility] 09/18/2023 9:48 AM

## 2023-09-18 NOTE — Progress Notes (Signed)
Progress Note   Patient: Tyler [name] YQI:[id number] DOB: [birth year] DOA: 09/11/2023     7 DOS: the patient was seen and examined on 09/18/2023      Brief hospital course: Nyxon Shere Supak is a 77 y.o. 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  He presented to the hospital because of cough productive of yellowish phlegm, increasing shortness of breath, orthopnea, easy fatigability, about 10 pound weight gain in a week.  He was hospitalized on 08/13/2023 for CHF exacerbation.  His diuretic regimen had been adjusted in the outpatient setting but his symptoms did not improve. He also complained of oozing from the wounds on the bottom of his feet.   He was admitted to the hospital for acute exacerbation of chronic systolic CHF, community-acquired pneumonia, infected plantar ulcers on bilateral feet and right foot osteomyelitis.  Dr. Annamary Rummage, podiatrist, was consulted for right foot osteomyelitis.  He recommended MRI of the foot for further evaluation.  MRI of the left foot showed plantar surface ulcer without evidence of osteomyelitis or abscess.  MRI of the right foot showed septic arthritis of right hallux IPJ, and OM of the proximal and distal phalanx.  Podiatry was consulted who recommended right hallux amputation versus ray resection and left foot ulcer debridement.  Patient was also seen by vascular surgery who performed right lower extremity arteriogram.  Patient underwent angioplasty and stent placement of right SFA and popliteal arteries 12/20.   Assessment and Plan:  Acute on chronic heart failure with reduced ejection fraction (HFrEF, 30-35 %) (HCC) Nonischemic  cardiomyopathy Bilateral pleural effusions Moderate mitral regurgitation Patient failing outpatient management Cardiology consulted appreciate recommendations Last echo from October with EF 30 to 35% Continue diuresis as recommended by cardiologist Patient previously on Entresto but did not tolerate it  Continue GDMT with metoprolol and Kerendia Continue Daily weights with intake and output monitoring     CAP (community acquired pneumonia) Patient with several week history of productive cough Patient with low-grade temp of 99 leukocytosis of 11,000 chest x-ray showing right upper lobe opacity suspicious for pneumonia Received Rocephin and azithromycin in the ED Has completed antibiotic course     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  S/p R great toe amp and L foot wound I&D w/ wound graft with podiatry 12/22 - Per podiatry " -WB Status: May be heel weight bearing on the right foot and partial minimal weight bearing on the left with most weight toward to heel. . -Medications/ABX: 2 weeks doxycyline on discharge -Dressing to remain c/d/I until follow up in 1 weeks - Return in 1 week to [place] office on 12/30 with Dr. Ralene Cork."     H/o Persistent atrial fibrillation (HCC) Continue metoprolol Patient off of Eliquis due to hematuria. (per review of heart failure clinic note from earlier in the day) Patient has been previously referred for ablation Continue aspirin   Complete heart block (HCC)   s/p PACEMAKER In 2021. No acute issues   Uncontrolled type 2 diabetes mellitus with hyperglycemia, without long-term current use of insulin (HCC) A1c 7.2 2 months ago.  Continue insulin therapy  HTN (hypertension) BP controlled Continue metoprolol   Chronic kidney disease, stage 3a (HCC) Renal function at baseline   Anemia due to chronic  kidney disease Hemoglobin at baseline     Subjective:  Patient seen and examined at bedside this morning Still requiring 2 L of oxygen Continues to be on IV Bumex Denies chest pain nausea vomiting   physical Exam:   Constitutional: In no distress.  Cardiovascular: Normal rate, regular rhythm. 2+ edema of BLE, stable  Pulmonary: Non labored breathing on room air, no wheezing or rales.  Abdominal: Soft. Normal bowel sounds. Non distended and non tender Musculoskeletal: Normal range of motion. Bilateral feet bandaged, c/d/I  Neurological: Alert and oriented to person, place, and time. Non focal  Skin: Skin is warm and dry.    Family Communication: Daughter is at bedside   Disposition: Status is: Inpatient Remains inpatient appropriate because:  HF exacerbation  Planned Discharge Destination:  Pending    Data Reviewed:    Latest Ref Rng & Units 09/18/2023    6:28 AM 09/17/2023    3:10 AM 09/16/2023    4:20 AM  BMP  Glucose 70 - 99 mg/dL 308  657  846   BUN 8 - 23 mg/dL 50  49  51   Creatinine 0.61 - 1.24 mg/dL 9.62  9.52  8.41   Sodium 135 - 145 mmol/L 137  135  138   Potassium 3.5 - 5.1 mmol/L 3.6  3.4  3.7   Chloride 98 - 111 mmol/L 102  104  104   CO2 22 - 32 mmol/L 25  23  23    Calcium 8.9 - 10.3 mg/dL 8.9  8.6  8.8        Latest Ref Rng & Units 09/18/2023    6:28 AM 09/17/2023    3:10 AM 09/16/2023    4:20 AM  CBC  WBC 4.0 - 10.5 K/uL 6.8  7.4  8.1   Hemoglobin 13.0 - 17.0 g/dL 9.6  9.1  32.4   Hematocrit 39.0 - 52.0 % 29.8  28.6  33.1   Platelets 150 - 400 K/uL 219  231  252     Vitals:   09/17/23 2050 09/18/23 0806 09/18/23 1233 09/18/23 1624  BP: 126/82 127/76 121/73 119/64  Pulse: 98 94 84 80  Resp: (!) 21 (!) 35    Temp: 99 F (37.2 C) 97.8 F (36.6 C)    TempSrc: Oral Oral    SpO2: 97% 96% 100% 99%  Weight:      Height:         Author: Loyce Dys, MD 09/18/2023 4:26 PM  For on call review [email or web address].

## 2023-09-19 DIAGNOSIS — I509 Heart failure, unspecified: Secondary | ICD-10-CM | POA: Diagnosis not present

## 2023-09-19 LAB — CBC WITH DIFFERENTIAL/PLATELET
Abs Immature Granulocytes: 0.03 10*3/uL (ref 0.00–0.07)
Basophils Absolute: 0.1 10*3/uL (ref 0.0–0.1)
Basophils Relative: 1 %
Eosinophils Absolute: 0.4 10*3/uL (ref 0.0–0.5)
Eosinophils Relative: 6 %
HCT: 30.7 % — ABNORMAL LOW (ref 39.0–52.0)
Hemoglobin: 9.6 g/dL — ABNORMAL LOW (ref 13.0–17.0)
Immature Granulocytes: 0 %
Lymphocytes Relative: 20 %
Lymphs Abs: 1.4 10*3/uL (ref 0.7–4.0)
MCH: 27 pg (ref 26.0–34.0)
MCHC: 31.3 g/dL (ref 30.0–36.0)
MCV: 86.5 fL (ref 80.0–100.0)
Monocytes Absolute: 0.6 10*3/uL (ref 0.1–1.0)
Monocytes Relative: 9 %
Neutro Abs: 4.6 10*3/uL (ref 1.7–7.7)
Neutrophils Relative %: 64 %
Platelets: 259 10*3/uL (ref 150–400)
RBC: 3.55 MIL/uL — ABNORMAL LOW (ref 4.22–5.81)
RDW: 16 % — ABNORMAL HIGH (ref 11.5–15.5)
WBC: 7.2 10*3/uL (ref 4.0–10.5)
nRBC: 0 % (ref 0.0–0.2)

## 2023-09-19 LAB — GLUCOSE, CAPILLARY
Glucose-Capillary: 126 mg/dL — ABNORMAL HIGH (ref 70–99)
Glucose-Capillary: 294 mg/dL — ABNORMAL HIGH (ref 70–99)
Glucose-Capillary: 137 mg/dL — ABNORMAL HIGH (ref 70–99)
Glucose-Capillary: 148 mg/dL — ABNORMAL HIGH (ref 70–99)

## 2023-09-19 LAB — BASIC METABOLIC PANEL
Anion gap: 12 (ref 5–15)
BUN: 50 mg/dL — ABNORMAL HIGH (ref 8–23)
CO2: 25 mmol/L (ref 22–32)
Calcium: 9.2 mg/dL (ref 8.9–10.3)
Chloride: 100 mmol/L (ref 98–111)
Creatinine, Ser: 1.25 mg/dL — ABNORMAL HIGH (ref 0.61–1.24)
GFR, Estimated: 59 mL/min — ABNORMAL LOW (ref >60)
Glucose, Bld: 153 mg/dL — ABNORMAL HIGH (ref 70–99)
Potassium: 3.7 mmol/L (ref 3.5–5.1)
Sodium: 137 mmol/L (ref 135–145)

## 2023-09-19 MED ORDER — LOSARTAN POTASSIUM 25 MG PO TABS
25.0000 mg | ORAL_TABLET | Freq: Every day | ORAL | Status: DC
Start: 2023-09-19 — End: 2023-09-21
  Administered 2023-09-19 – 2023-09-21 (×3): 25 mg via ORAL
  Filled 2023-09-19 (×3): qty 1

## 2023-09-19 MED ORDER — ENOXAPARIN SODIUM 80 MG/0.8ML IJ SOSY
60.0000 mg | PREFILLED_SYRINGE | INTRAMUSCULAR | Status: DC
  Administered 2023-09-19 – 2023-09-21 (×2): 60 mg via SUBCUTANEOUS
  Filled 2023-09-19 (×2): qty 0.8

## 2023-09-19 NOTE — Plan of Care (Signed)

## 2023-09-19 NOTE — Plan of Care (Signed)
  Problem: Education: Goal: Ability to describe self-care measures that may prevent or decrease complications (Diabetes Survival Skills Education) will improve Outcome: Progressing Goal: Individualized Educational Video(s) Outcome: Progressing   Problem: Coping: Goal: Ability to adjust to condition or change in health will improve Outcome: Progressing   Problem: Fluid Volume: Goal: Ability to maintain a balanced intake and output will improve Outcome: Progressing   Problem: Health Behavior/Discharge Planning: Goal: Ability to identify and utilize available resources and services will improve Outcome: Progressing Goal: Ability to manage health-related needs will improve Outcome: Progressing   Problem: Metabolic: Goal: Ability to maintain appropriate glucose levels will improve Outcome: Progressing   Problem: Nutritional: Goal: Maintenance of adequate nutrition will improve Outcome: Progressing Goal: Progress toward achieving an optimal weight will improve Outcome: Progressing   Problem: Skin Integrity: Goal: Risk for impaired skin integrity will decrease Outcome: Progressing   Problem: Tissue Perfusion: Goal: Adequacy of tissue perfusion will improve Outcome: Progressing   Problem: Education: Goal: Ability to demonstrate management of disease process will improve Outcome: Progressing Goal: Ability to verbalize understanding of medication therapies will improve Outcome: Progressing Goal: Individualized Educational Video(s) Outcome: Progressing   Problem: Activity: Goal: Capacity to carry out activities will improve Outcome: Progressing   Problem: Cardiac: Goal: Ability to achieve and maintain adequate cardiopulmonary perfusion will improve Outcome: Progressing   Problem: Education: Goal: Understanding of CV disease, CV risk reduction, and recovery process will improve Outcome: Progressing Goal: Individualized Educational Video(s) Outcome: Progressing    Problem: Activity: Goal: Ability to return to baseline activity level will improve Outcome: Progressing   Problem: Cardiovascular: Goal: Ability to achieve and maintain adequate cardiovascular perfusion will improve Outcome: Progressing Goal: Vascular access site(s) Level 0-1 will be maintained Outcome: Progressing   Problem: Health Behavior/Discharge Planning: Goal: Ability to safely manage health-related needs after discharge will improve Outcome: Progressing   Problem: Education: Goal: Knowledge of General Education information will improve Description: Including pain rating scale, medication(s)/side effects and non-pharmacologic comfort measures Outcome: Progressing   Problem: Health Behavior/Discharge Planning: Goal: Ability to manage health-related needs will improve Outcome: Progressing   Problem: Clinical Measurements: Goal: Ability to maintain clinical measurements within normal limits will improve Outcome: Progressing Goal: Will remain free from infection Outcome: Progressing Goal: Diagnostic test results will improve Outcome: Progressing Goal: Respiratory complications will improve Outcome: Progressing Goal: Cardiovascular complication will be avoided Outcome: Progressing   Problem: Activity: Goal: Risk for activity intolerance will decrease Outcome: Progressing   Problem: Nutrition: Goal: Adequate nutrition will be maintained Outcome: Progressing   Problem: Coping: Goal: Level of anxiety will decrease Outcome: Progressing   Problem: Elimination: Goal: Will not experience complications related to bowel motility Outcome: Progressing Goal: Will not experience complications related to urinary retention Outcome: Progressing   Problem: Pain Management: Goal: General experience of comfort will improve Outcome: Progressing   Problem: Safety: Goal: Ability to remain free from injury will improve Outcome: Progressing   Problem: Skin Integrity: Goal:  Risk for impaired skin integrity will decrease Outcome: Progressing

## 2023-09-19 NOTE — Progress Notes (Signed)
Progress Note   Patient: Tyler [name] VQQ:[id number] DOB: 1946/09/16 DOA: 09/11/2023     8 DOS: the patient was seen and examined on 09/19/2023   Brief hospital course: From H&P "Tyler [name] is a 77 y.o. 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  He presented to the hospital because of cough productive of yellowish phlegm, increasing shortness of breath, orthopnea, easy fatigability, about 10 pound weight gain in a week.  He was hospitalized on 08/13/2023 for CHF exacerbation.  His diuretic regimen had been adjusted in the outpatient setting but his symptoms did not improve. He also complained of oozing from the wounds on the bottom of his feet.   He was admitted to the hospital for acute exacerbation of chronic systolic CHF, community-acquired pneumonia, infected plantar ulcers on bilateral feet and right foot osteomyelitis.  Dr. Annamary [name], podiatrist, was consulted for right foot osteomyelitis.  He recommended MRI of the foot for further evaluation.  MRI of the left foot showed plantar surface ulcer without evidence of osteomyelitis or abscess.  MRI of the right foot showed septic arthritis of right hallux IPJ, and OM of the proximal and distal phalanx.  Podiatry was consulted who recommended right hallux amputation versus ray resection and left foot ulcer debridement.  Patient was also seen by vascular surgery who performed right lower extremity arteriogram.  Patient underwent angioplasty and stent placement of right SFA and popliteal arteries 12/20.  "   Assessment and Plan:  Acute on chronic heart failure with reduced ejection fraction (HFrEF, 30-35 %)  (HCC) Nonischemic cardiomyopathy Bilateral pleural effusions Moderate mitral regurgitation Patient failing outpatient management Cardiology consulted appreciate recommendations Last echo from October with EF 30 to 35% Continue IV diuresis as recommended by cardiologist Patient previously on Entresto but did not tolerate it  Continue GDMT with metoprolol and Kerendia Continue Daily weights with intake and output monitoring     CAP (community acquired pneumonia) Patient with several week history of productive cough Patient with low-grade temp of 99 leukocytosis of 11,000 chest x-ray showing right upper lobe opacity suspicious for pneumonia Received Rocephin and azithromycin in the ED Has completed antibiotic course     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  S/p R great toe amp and L foot wound I&D w/ wound graft with podiatry 12/22 - Per podiatry " -WB Status: May be heel weight bearing on the right foot and partial minimal weight bearing on the left with most weight toward to heel. . -Medications/ABX: 2 weeks doxycyline on discharge -Dressing to remain c/d/I until follow up in 1 weeks - Return in 1 week to [place] office on 12/30 with Dr. Ralene [name]."     H/o Persistent atrial fibrillation (HCC) Continue metoprolol Patient off of Eliquis due to hematuria. (per review of heart failure clinic note from earlier in the day) Patient has been previously referred for ablation Continue aspirin   Complete heart block (HCC)   s/p PACEMAKER In 2021. No acute issues   Uncontrolled type 2 diabetes mellitus with hyperglycemia, without long-term current use of insulin (HCC) A1c 7.2 2 months ago.  Continue insulin therapy  HTN (hypertension) BP controlled Continue metoprolol   Chronic kidney disease, stage 3a (HCC) Renal function at baseline    Anemia due to chronic kidney disease Hemoglobin at baseline     Subjective:  Patient currently on 2 L of oxygen Admits to significant improvement in lower extremity swelling Case discussed with cardiology team today we will continue with IV diuresis today and reassess tomorrow Denied worsening shortness of breath chest pain or cough   Physical Exam:   General: Currently on 2 L of intranasal oxygen Cardiovascular: Normal rate, regular rhythm. 2+ edema of BLE, stable  Pulmonary: Non labored breathing on room air, no wheezing or rales.  Abdominal: Soft. Normal bowel sounds. Non distended and non tender Musculoskeletal: Normal range of motion. Bilateral feet bandaged, c/d/I  Neurological: Alert and oriented to person, place, and time. Non focal  Skin: Skin is warm and dry.    Family Communication: Daughter is at bedside   Disposition: Status is: Inpatient Remains inpatient appropriate because:  HF exacerbation  Planned Discharge Destination:  Pending    Data Reviewed:     Latest Ref Rng & Units 09/19/2023    4:19 AM 09/18/2023    6:28 AM 09/17/2023    3:10 AM  BMP  Glucose 70 - 99 mg/dL 295  621  308   BUN 8 - 23 mg/dL 50  50  49   Creatinine 0.61 - 1.24 mg/dL 6.57  8.46  9.62   Sodium 135 - 145 mmol/L 137  137  135   Potassium 3.5 - 5.1 mmol/L 3.7  3.6  3.4   Chloride 98 - 111 mmol/L 100  102  104   CO2 22 - 32 mmol/L 25  25  23    Calcium 8.9 - 10.3 mg/dL 9.2  8.9  8.6        Latest Ref Rng & Units 09/19/2023    4:19 AM 09/18/2023    6:28 AM 09/17/2023    3:10 AM  CBC  WBC 4.0 - 10.5 K/uL 7.2  6.8  7.4   Hemoglobin 13.0 - 17.0 g/dL 9.6  9.6  9.1   Hematocrit 39.0 - 52.0 % 30.7  29.8  28.6   Platelets 150 - 400 K/uL 259  219  231     Vitals:   09/19/23 0423 09/19/23 0500 09/19/23 1306 09/19/23 1645  BP: 117/66  (!) 116/58 106/72  Pulse: 88  80 63  Resp: 20  17 17   Temp: 98.6 F (37 C)     TempSrc: Oral     SpO2: 94%  98% 100%  Weight:  108.7 kg     Height:        Time spent: 40 minutes  Author: Loyce Dys, MD 09/19/2023 4:48 PM  For on call review [email or web address].

## 2023-09-19 NOTE — Progress Notes (Cosign Needed)
[facility] CLINIC CARDIOLOGY PROGRESS NOTE       Patient ID: Tyler [name] MRN: [id number] DOB/AGE: 1946/04/25 77 y.o.  Admit date: 09/11/2023 Referring Physician Dr. Lindajo Royal Primary Physician Elder Negus, NP  Primary Cardiologist Dr. Corky Sing (has been scheduled but no showed appointment) Reason for Consultation AoCHF  HPI: Tyler [name] is a 77 y.o. male  with a past medical history of  chronic HFrEF, persistent atrial fibrillation, mitral insufficiency, CHB s/p PPM 2021, hypertension, chronic kidney disease stage III  who presented to the ED on 09/11/2023 for shortness of breath. Cardiology was consulted for further evaluation.   Interval history: -Patient seen and examined this AM, resting comfortably with no complaints.  -SOB and LE edema significantly improved.  -Renal function stable. Good UOP with IV diuresis.   Review of systems complete and found to be negative unless listed above    Past Medical History:  Diagnosis Date   (HFpEF) heart failure with preserved ejection fraction (HCC) 03/01/2020   a.) TTE 03/01/2020: EF 55-60%, mod MAC, mod AoV sclerosis, triv AR, mild TR, mod MR, RVSP 50-59; b.) TTE 09/10/2020: EF 55-60%, mod MAC, mod AoV sclerosis, mild TR, 3+ MR, RVSP 50-59; c.) TTE 09/26/2020: EF 55-60%, mild LA dil, triv PR, mild MR/TR, RVSP 37-49   Adenoma of left adrenal gland    Anemia    Arthritis    Atrial fibrillation and flutter (HCC)    a.) CHA2DS2VASc = 5 (age x2, HFpEF, HTN, T2DM);  b.) s/p CTI ablation 09/07/2020; c.) rate/rhythm maintained on oral carvedilol; not on chronic anticoagulation therapy   CAD (coronary artery disease)    Cardiomegaly    CKD (chronic kidney disease), stage III (HCC)    DOE (dyspnea on exertion)    Drug-induced bradycardia    Gangrene of toe of left foot (HCC)    a.) 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    HLD (hyperlipidemia)    Hypertension     Long term current use of aspirin    Lymphedema of both lower extremities    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)    a.) s/p amputation 11/04/2022   Peripheral vascular disease (HCC)    Pleural effusion on right 09/09/2020   a.) s/p RIGHT thoracentesis with 2180 cc yield   Pneumonia    Presence of permanent cardiac pacemaker 09/10/2020   a.) TVP placement 09/10/2020 due to intermittent CHB in setting of urosepsis; b.) s/p PPM placement 09/15/2020: MDT Azure XT DR (SN: [id number])   Pulmonary hypertension (HCC) 03/01/2020   a.) TTE: 03/01/2020: RVSP 50-59; b.) TTE 09/10/2020: RVSP 50-59; c.) TTE 09/26/2020: RVSP 37-49   RA (rheumatoid arthritis) (HCC)    Rheumatic fever    Sepsis (HCC) 09/10/2020   a.) urosepsis --> BC x 2 sets and UC all grew out significant Proteus mirabilis; admitted to [facility] 09/07/2020 - 09/27/2020.   Sick sinus syndrome [facility])    a.) s/p MDT PPM placement 09/15/2020   T2DM (type 2 diabetes mellitus) (HCC)    Urinary retention    chronic, with indwelling Foley catheter and plans for a suprapubic   Wears dentures    full upper    Past Surgical History:  Procedure Laterality Date   AMPUTATION TOE Right 11/04/2022   Procedure: AMPUTATION TOE;  Surgeon: Felecia Shelling, [name];  [name];  Service: Podiatry;  Laterality: Right;   AMPUTATION TOE Right 09/15/2023  Procedure: AMPUTATION TOE;  Surgeon: Louann Sjogren, [name];  [name];  Service: Orthopedics/Podiatry;  Laterality: Right;  Right hallux amputation   BONE BIOPSY Right 04/05/2023   Procedure: BONE BIOPSY THIRD & FOURTH;  Surgeon: Felecia Shelling, [name];  [name];  Service: Podiatry;  Laterality: Right;   CARDIAC ELECTROPHYSIOLOGY STUDY AND ABLATION N/A 09/07/2020   Procedure: CARDIAC EP STUDY AND ABLATION (CTI)   CATARACT EXTRACTION W/PHACO Right 01/16/2017   Procedure: CATARACT EXTRACTION PHACO AND INTRAOCULAR LENS PLACEMENT (IOC)  Right Complicated;   Surgeon: Lockie Mola, MD;  Location: [facility];  Service: Ophthalmology;  Laterality: Right;  IVA Block Healon 5 malyugin vision blue Diabetic - oral meds   CATARACT EXTRACTION W/PHACO Left 10/23/2022   Procedure: CATARACT EXTRACTION PHACO AND INTRAOCULAR LENS PLACEMENT (IOC) LEFT DIABETIC;  Surgeon: Galen Manila, MD;  Location: [facility];  Service: Ophthalmology;  Laterality: Left;  Diabetic   COLONOSCOPY     INCISION AND DRAINAGE OF WOUND Left 09/15/2023   Procedure: IRRIGATION AND DEBRIDEMENT WOUND;  Surgeon: Louann Sjogren, [name];  [name];  Service: Orthopedics/Podiatry;  Laterality: Left;  Left foot wound debridement, possible graft/biopsy   LOWER EXTREMITY ANGIOGRAPHY Right 11/02/2022   Procedure: Lower Extremity Angiography;  Surgeon: Annice Needy, MD;  Location: [facility];  Service: Cardiovascular;  Laterality: Right;   LOWER EXTREMITY ANGIOGRAPHY Right 09/13/2023   Procedure: Lower Extremity Angiography;  Surgeon: Renford Dills, MD;  Location: [facility];  Service: Cardiovascular;  Laterality: Right;   METATARSAL HEAD EXCISION Left 07/05/2018   Procedure: RESECTION FIRST METATARSAL INFECTED BONE AND SOFT TISSUE;  Surgeon: Recardo Evangelist, [name];  [name];  Service: Podiatry;  Laterality: Left;   METATARSAL HEAD EXCISION Right 04/05/2023   Procedure: METATARSAL HEAD EXCISION THIRD & FOURTH;  Surgeon: Felecia Shelling, [name];  [name];  Service: Podiatry;  Laterality: Right;   PACEMAKER INSERTION  09/15/2020   TOE AMPUTATION Left    TONSILLECTOMY      Medications Prior to Admission  Medication Sig Dispense Refill Last Dose/Taking   acetaminophen (TYLENOL) 500 MG tablet Take 2 tablets (1,000 mg total) by mouth 3 (three) times daily as needed (for pain).   Taking As Needed   aspirin EC 81 MG tablet Take 1 tablet (81 mg total) by mouth daily. Swallow whole. 30 tablet 0 09/11/2023   bumetanide (BUMEX) 2  MG tablet Take 1 tablet (2 mg total) by mouth daily. 90 tablet 3 09/11/2023   Finerenone (KERENDIA) 10 MG TABS Take 1 tablet (10 mg total) by mouth daily. 30 tablet 2 09/11/2023 Morning   gentamicin cream (GARAMYCIN) 0.1 % Apply 1 Application topically 2 (two) times daily. 30 g 1 09/11/2023 Morning   glipiZIDE (GLUCOTROL) 10 MG tablet Take 1 tablet (10 mg total) by mouth 2 (two) times daily. 60 tablet 2 09/11/2023 Morning   metFORMIN (GLUCOPHAGE) 1000 MG tablet Take 1 tablet (1,000 mg total) by mouth 2 (two) times daily. 60 tablet 2 09/11/2023 Morning   metoprolol succinate (TOPROL-XL) 50 MG 24 hr tablet Take 1 tablet (50 mg total) by mouth daily. Take with or immediately following a meal. 30 tablet 2 09/11/2023   Multiple Vitamins-Minerals (MULTIVITAMIN WITH MINERALS) tablet Take 1 tablet by mouth daily.   09/11/2023   silver sulfADIAZINE (SILVADENE) 1 % cream Apply to affected area daily 50 g 1 09/11/2023   Social History   Socioeconomic History   Marital status: Single    Spouse  name: Not on file   Number of children: Not on file   Years of education: Not on file   Highest education level: Not on file  Occupational History    Comment: [employer]  Tobacco Use   Smoking status: Never   Smokeless tobacco: Never  Vaping Use   Vaping status: Never Used  Substance and Sexual Activity   Alcohol use: Not Currently    Comment: rarely   Drug use: Never   Sexual activity: Not Currently    Birth control/protection: None  Other Topics Concern   Not on file  Social History Narrative   Lives with roommate   Social Drivers of Health   Financial Resource Strain: Low Risk  (03/20/2023)   Received from [facility], [facility]   Overall Financial Resource Strain (CARDIA)    Difficulty of Paying Living Expenses: Not hard at all  Food Insecurity: No Food Insecurity (09/13/2023)   Hunger Vital Sign    Worried About Running Out of Food in the Last Year: Never true    Ran Out of  Food in the Last Year: Never true  Transportation Needs: Patient Declined (09/14/2023)   PRAPARE - Transportation    Lack of Transportation (Medical): Patient declined    Lack of Transportation (Non-Medical): Patient declined  Physical Activity: Not on file  Stress: No Stress Concern Present (09/07/2020)   Received from [facility], [facility]   [employer] of Occupational Health - Occupational Stress Questionnaire    Feeling of Stress : Not at all  Social Connections: Unknown (02/01/2022)   Received from [facility], [facility]   Social Network    Social Network: Not on file  Intimate Partner Violence: Patient Declined (09/14/2023)   Humiliation, Afraid, Rape, and Kick questionnaire    Fear of Current or Ex-Partner: Patient declined    Emotionally Abused: Patient declined    Physically Abused: Patient declined    Sexually Abused: Patient declined    Family History  Problem Relation Age of Onset   Cancer Niece      Vitals:   09/18/23 2021 09/19/23 0008 09/19/23 0423 09/19/23 0500  BP: 127/72 123/60 117/66   Pulse: 91 80 88   Resp: 19 20 20    Temp: 98.4 F (36.9 C) 98.5 F (36.9 C) 98.6 F (37 C)   TempSrc: Oral Oral Oral   SpO2: 97% 98% 94%   Weight:    108.7 kg  Height:        PHYSICAL EXAM General: Chronically ill appearing male, well nourished, in no acute distress resting comfortably in hospital bed. HEENT: Normocephalic and atraumatic. Neck: No JVD.  Lungs: Normal respiratory effort on room air. Clear bilaterally to auscultation. No wheezes, crackles, rhonchi.  Heart: HRRR. Normal S1 and S2 without gallops or murmurs.  Abdomen: Non-distended appearing.  Msk: Normal strength and tone for age. Extremities: Warm and well perfused. No clubbing, cyanosis. Chronic appearing bilateral LE edema, bilateral foot ulcers. Edema appears improved. Neuro: Alert and oriented X 3. Psych: Answers questions appropriately.   Labs: Basic Metabolic Panel: Recent  Labs    09/18/23 0628 09/19/23 0419  NA 137 137  K 3.6 3.7  CL 102 100  CO2 25 25  GLUCOSE 166* 153*  BUN 50* 50*  CREATININE 1.16 1.25*  CALCIUM 8.9 9.2   Liver Function Tests: No results for input(s): "AST", "ALT", "ALKPHOS", "BILITOT", "PROT", "ALBUMIN" in the last 72 hours. No results for input(s): "LIPASE", "AMYLASE" in the last 72 hours.  CBC: Recent Labs    09/18/23 0628 09/19/23 0419  WBC 6.8 7.2  NEUTROABS 4.4 4.6  HGB 9.6* 9.6*  HCT 29.8* 30.7*  MCV 83.7 86.5  PLT 219 259   Cardiac Enzymes: No results for input(s): "CKTOTAL", "CKMB", "CKMBINDEX", "TROPONINIHS" in the last 72 hours.  BNP: No results for input(s): "BNP" in the last 72 hours.  D-Dimer: No results for input(s): "DDIMER" in the last 72 hours. Hemoglobin A1C: No results for input(s): "HGBA1C" in the last 72 hours. Fasting Lipid Panel: No results for input(s): "CHOL", "HDL", "LDLCALC", "TRIG", "CHOLHDL", "LDLDIRECT" in the last 72 hours. Thyroid Function Tests: No results for input(s): "TSH", "T4TOTAL", "T3FREE", "THYROIDAB" in the last 72 hours.  Invalid input(s): "FREET3" Anemia Panel: No results for input(s): "VITAMINB12", "FOLATE", "FERRITIN", "TIBC", "IRON", "RETICCTPCT" in the last 72 hours.   Radiology: DG Foot 2 Views Right Result Date: 09/15/2023 CLINICAL DATA:  Right foot osteomyelitis. EXAM: RIGHT FOOT - 2 VIEW COMPARISON:  MRI 09/12/2023.  X-ray 09/11/2023 FINDINGS: Interval great toe amputation at the level of the MTP joint. Stable appearance third toe amputation at the mid metatarsal level with evidence of prior resection of the fourth metatarsal head. IMPRESSION: Interval great toe amputation at the level of the MTP joint. Electronically Signed   By: Kennith Center M.D.   On: 09/15/2023 14:04   PERIPHERAL VASCULAR CATHETERIZATION Result Date: 09/13/2023 See surgical note for result.  MR FOOT LEFT WO CONTRAST Result Date: 09/12/2023 CLINICAL DATA:  Plantar foot ulcer. EXAM: MRI  OF THE LEFT FOOT WITHOUT CONTRAST TECHNIQUE: Multiplanar, multisequence MR imaging of the left forefoot was performed. No intravenous contrast was administered. COMPARISON:  Left foot x-rays from yesterday. CT left foot dated July 06, 2023. FINDINGS: Bones/Joint/Cartilage No suspicious marrow signal abnormality. Subtle periarticular marrow edema along the plantar aspect of the first TMT joint is also seen at the dorsal aspect of the joint and is most likely degenerative. 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 No fracture or dislocation. 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 Mild midfoot degenerative changes. No joint effusion. Ligaments Collateral ligaments are intact. Muscles and Tendons No tenosynovitis. Complete fatty atrophy of the intrinsic foot muscles. Soft tissue Ulceration along the medial plantar aspect of the midfoot near the base of the medial cuneiform. Diffuse soft tissue swelling. No fluid collection. No soft tissue mass. IMPRESSION: 1. Skin ulceration along the medial plantar aspect of the midfoot near the base of the medial cuneiform. No evidence of osteomyelitis or abscess. Electronically Signed   By: [name] M.D.   On: 09/12/2023 18:01   MR FOOT RIGHT WO CONTRAST Result Date: 09/12/2023 CLINICAL DATA:  Right foot ulcer. EXAM: MRI OF THE RIGHT FOREFOOT WITHOUT CONTRAST TECHNIQUE: Multiplanar, multisequence MR imaging of the right forefoot was performed. No intravenous contrast was administered. COMPARISON:  Right foot x-rays from yesterday. CT right foot dated July 06, 2023. FINDINGS: Bones/Joint/Cartilage Abnormal marrow edema involving the first proximal phalanx head and majority of the first distal phalanx. First IP joint effusion. Prior third ray distal fourth metatarsal amputations. No fracture or dislocation. Unchanged severe first MTP joint osteoarthritis. No joint  effusion. Ligaments Collateral ligaments are intact. Muscles and Tendons No tenosynovitis. Near complete fatty atrophy of the intrinsic muscles. Soft tissue Deep ulceration along the medial plantar aspect of the great toe with sinus tract extending to the IP joint. Diffuse soft tissue swelling without focal fluid collection. Small ganglion  cyst along the plantar aspect of the second metatarsal head. IMPRESSION: 1. Deep ulceration along the medial plantar aspect of the great toe with sinus tract extending to the IP joint. Associated septic arthritis and osteomyelitis of the first proximal and distal phalanges. No abscess. Electronically Signed   By: [name] M.D.   On: 09/12/2023 17:56   DG Foot 2 Views Right Result Date: 09/11/2023 CLINICAL DATA:  Diabetic foot ulcer EXAM: RIGHT FOOT - 2 VIEW COMPARISON:  None Available. FINDINGS: Transmetatarsal amputation of the right third toe and resection of the right fourth metatarsal head. Large ulcer is seen within the medial and plantar right great toe which extends to the cortex of the base of the distal phalanx with osseous erosion in this location in keeping with changes of osteomyelitis. There is extensive superimposed soft tissue swelling right great toe and second toe as well as moderate soft tissue swelling of the right forefoot diffusely. Vascular calcifications are noted. Advanced degenerative arthritis noted of the first metatarsophalangeal joint. No acute fracture or dislocation. IMPRESSION: 1. Large ulcer within the medial and plantar right great toe which extends to the cortex of the base of the distal phalanx with osseous erosion in this location in keeping with changes of osteomyelitis. 2. Transmetatarsal amputation of the right third toe and resection of the right fourth metatarsal head. Electronically Signed   By: Helyn Numbers M.D.   On: 09/11/2023 22:48   DG Foot 2 Views Left Result Date: 09/11/2023 CLINICAL DATA:  Diabetic ulcer EXAM:  LEFT FOOT - 2 VIEW COMPARISON:  None Available. FINDINGS: No acute fracture or dislocation. 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 There is a soft tissue wound within the soft tissues distal to the residual first metatarsal. No definite superimposed osseous erosion. There is diffuse soft tissue swelling of the second and third digits. Posttraumatic changes noted involving the second metatarsal head. Moderate midfoot and tibiotalar degenerative arthritis. Vascular calcifications are noted. IMPRESSION: 1. 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 Soft tissue wound within the soft tissues distal to the residual first metatarsal. No definite superimposed osseous erosion. 2. Diffuse soft tissue swelling of the second and third digits. Electronically Signed   By: Helyn Numbers M.D.   On: 09/11/2023 22:46   DG Chest 2 View Result Date: 09/11/2023 CLINICAL DATA:  Shortness of breath EXAM: CHEST - 2 VIEW COMPARISON:  08/13/2023 FINDINGS: Small bilateral pleural effusions. No frank interstitial edema. Mild vague right upper lobe opacity, suspicious for pneumonia. No pleural effusion or pneumothorax. Cardiomegaly.  Left subclavian pacemaker. Degenerative changes of the visualized thoracolumbar spine. IMPRESSION: Mild vague right upper lobe opacity, suspicious for pneumonia. Small bilateral pleural effusions. No frank interstitial edema. Electronically Signed   By: Charline Bills M.D.   On: 09/11/2023 18:09    ECHO 06/2023: 1. Left ventricular ejection fraction, by estimation, is 30 to 35%. The left ventricle has moderately decreased function. The left ventricle demonstrates regional wall motion abnormalities (see scoring diagram/findings for description). Indeterminate diastolic filling due to E-A fusion.   2. Right ventricular systolic function is normal. The right ventricular  size is normal.   3. The mitral valve is normal in structure. Moderate mitral valve  regurgitation.  No evidence of mitral stenosis.   4. The aortic valve is normal in structure. Aortic valve regurgitation is  not visualized. No aortic stenosis is present.   5. The inferior vena cava is normal in size with greater than 50%  respiratory variability,  suggesting right atrial pressure of 3 mmHg.   TELEMETRY reviewed by me 09/19/2023: ventricular pacing rate 90s  EKG reviewed by me: VP with PVCs rate 95 bpm  Data reviewed by me 09/19/2023: last 24h vitals tele labs imaging I/O hospitalist progress note  Active Problems:   Acute on chronic congestive heart failure (HCC)   Persistent atrial fibrillation (HCC)   HTN (hypertension)   Chronic kidney disease, stage 3a (HCC)   Acute on chronic heart failure with reduced ejection fraction (HFrEF,30-35 %) (HCC)   Moderate mitral regurgitation by prior echocardiogram   Nonischemic cardiomyopathy (HCC)   Status cardiac pacemaker   Anemia due to chronic kidney disease   Uncontrolled type 2 diabetes mellitus with hyperglycemia, without long-term current use of insulin (HCC)   Chronic bilateral pleural effusions   Complete heart block (HCC)  s/p PACEMAKER   Infected plantar ulcers both feet (HCC)   Osteomyelitis of right foot (HCC)   CAP (community acquired pneumonia)   Osteomyelitis of great toe of right foot (HCC)   Diabetic ulcer of left midfoot associated with type 2 diabetes mellitus, with fat layer exposed (HCC)    ASSESSMENT AND PLAN:  Tyler [name] is a 77 y.o. male  with a past medical history of  chronic HFrEF, persistent atrial fibrillation, mitral insufficiency, CHB s/p PPM 2021, hypertension, chronic kidney disease stage III  who presented to the ED on 09/11/2023 for shortness of breath. Cardiology was consulted for further evaluation.   # Acute on chronic heart failure reduced EF # Acute hypoxic respiratory failure # R great toe osteomyelitis s/p amputation Patient presented with worsening SOB, LE edema. Also foot wounds with  drainage. Seen at HF clinic yesterday, BNP 3500, sent to ED. BNP in ED 3800. Troponins 18 > 22. Has not required supplemental O2.  -Continue IV bumex 2 mg TID.  -Start losartan 25 mg daily. Continue home metoprolol succinate 50 mg daily and finerenone 10 mg daily. Did not tolerate Entresto. -Mild and flat troponin elevation most consistent with demand/supply mismatch and not ACS in the setting of acute heart failure. -No plan for further cardiac diagnostics at this time.   # CHB s/p Medtronic dual chamber PPM 08/2020 # Hx atrial fibrillation Patient with history of persistent atrial fibrillation previously on eliquis, this was discontinued due to hematuria in 2023. Underwent PPM implant in 2021 for CHB, has had regular device checks since implant.  -Beta-blocker as above for rate control -Recommend anticoagulation however patient has historically deferred given prior bleeding issues.  # Chronic kidney disease stage IIIa Patient with hx of CKD, baseline Cr 1.3-1.4. Cr this AM 1.25. -Continue to monitor renal function closely during diuresis.  This patient's plan of care was discussed and created with Dr. Darrold Junker and he is in agreement.  Signed: Gale Journey, PA-C  09/19/2023, 8:28 AM [facility]

## 2023-09-20 DIAGNOSIS — I5023 Acute on chronic systolic (congestive) heart failure: Secondary | ICD-10-CM | POA: Diagnosis not present

## 2023-09-20 LAB — BASIC METABOLIC PANEL
Anion gap: 9 (ref 5–15)
BUN: 52 mg/dL — ABNORMAL HIGH (ref 8–23)
CO2: 26 mmol/L (ref 22–32)
Calcium: 9.1 mg/dL (ref 8.9–10.3)
Chloride: 102 mmol/L (ref 98–111)
Creatinine, Ser: 1.19 mg/dL (ref 0.61–1.24)
GFR, Estimated: >60 mL/min (ref >60)
Glucose, Bld: 134 mg/dL — ABNORMAL HIGH (ref 70–99)
Potassium: 3.8 mmol/L (ref 3.5–5.1)
Sodium: 137 mmol/L (ref 135–145)

## 2023-09-20 LAB — CBC WITH DIFFERENTIAL/PLATELET
Abs Immature Granulocytes: 0.03 10*3/uL (ref 0.00–0.07)
Basophils Absolute: 0.1 10*3/uL (ref 0.0–0.1)
Basophils Relative: 1 %
Eosinophils Absolute: 0.5 10*3/uL (ref 0.0–0.5)
Eosinophils Relative: 8 %
HCT: 30.8 % — ABNORMAL LOW (ref 39.0–52.0)
Hemoglobin: 9.7 g/dL — ABNORMAL LOW (ref 13.0–17.0)
Immature Granulocytes: 1 %
Lymphocytes Relative: 20 %
Lymphs Abs: 1.3 10*3/uL (ref 0.7–4.0)
MCH: 26.6 pg (ref 26.0–34.0)
MCHC: 31.5 g/dL (ref 30.0–36.0)
MCV: 84.6 fL (ref 80.0–100.0)
Monocytes Absolute: 0.7 10*3/uL (ref 0.1–1.0)
Monocytes Relative: 10 %
Neutro Abs: 3.8 10*3/uL (ref 1.7–7.7)
Neutrophils Relative %: 60 %
Platelets: 234 10*3/uL (ref 150–400)
RBC: 3.64 MIL/uL — ABNORMAL LOW (ref 4.22–5.81)
RDW: 16.2 % — ABNORMAL HIGH (ref 11.5–15.5)
WBC: 6.4 10*3/uL (ref 4.0–10.5)
nRBC: 0 % (ref 0.0–0.2)

## 2023-09-20 LAB — GLUCOSE, CAPILLARY
Glucose-Capillary: 142 mg/dL — ABNORMAL HIGH (ref 70–99)
Glucose-Capillary: 193 mg/dL — ABNORMAL HIGH (ref 70–99)
Glucose-Capillary: 143 mg/dL — ABNORMAL HIGH (ref 70–99)

## 2023-09-20 NOTE — Progress Notes (Cosign Needed)
[facility] CLINIC CARDIOLOGY PROGRESS NOTE       Patient ID: [name] MRN: [id number] DOB/AGE: [birth year] 77 y.o.  Admit date: 09/11/2023 Referring Physician Dr. Lindajo Royal Primary Physician Elder Negus, NP  Primary Cardiologist Dr. Corky Sing (has been scheduled but no showed appointment) Reason for Consultation AoCHF  HPI: Tyler [name] is a 77 y.o. male  with a past medical history of  chronic HFrEF, persistent atrial fibrillation, mitral insufficiency, CHB s/p PPM 2021, hypertension, chronic kidney disease stage III  who presented to the ED on 09/11/2023 for shortness of breath. Cardiology was consulted for further evaluation.   Interval history: -Patient seen and examined this AM, reports feeling quite well today.  -SOB and LE edema significantly improved. Walking around room without issue.  -Renal function stable. Good UOP with IV diuresis.   Review of systems complete and found to be negative unless listed above    Past Medical History:  Diagnosis Date   (HFpEF) heart failure with preserved ejection fraction (HCC) 03/01/2020   a.) TTE 03/01/2020: EF 55-60%, mod MAC, mod AoV sclerosis, triv AR, mild TR, mod MR, RVSP 50-59; b.) TTE 09/10/2020: EF 55-60%, mod MAC, mod AoV sclerosis, mild TR, 3+ MR, RVSP 50-59; c.) TTE 09/26/2020: EF 55-60%, mild LA dil, triv PR, mild MR/TR, RVSP 37-49   Adenoma of left adrenal gland    Anemia    Arthritis    Atrial fibrillation and flutter (HCC)    a.) CHA2DS2VASc = 5 (age x2, HFpEF, HTN, T2DM);  b.) s/p CTI ablation 09/07/2020; c.) rate/rhythm maintained on oral carvedilol; not on chronic anticoagulation therapy   CAD (coronary artery disease)    Cardiomegaly    CKD (chronic kidney disease), stage III (HCC)    DOE (dyspnea on exertion)    Drug-induced bradycardia    Gangrene of toe of left foot (HCC)    a.) 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    HLD  (hyperlipidemia)    Hypertension    Long term current use of aspirin    Lymphedema of both lower extremities    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)    a.) s/p amputation 11/04/2022   Peripheral vascular disease (HCC)    Pleural effusion on right 09/09/2020   a.) s/p RIGHT thoracentesis with 2180 cc yield   Pneumonia    Presence of permanent cardiac pacemaker 09/10/2020   a.) TVP placement 09/10/2020 due to intermittent CHB in setting of urosepsis; b.) s/p PPM placement 09/15/2020: MDT Azure XT DR (SN: [id number])   Pulmonary hypertension (HCC) 03/01/2020   a.) TTE: 03/01/2020: RVSP 50-59; b.) TTE 09/10/2020: RVSP 50-59; c.) TTE 09/26/2020: RVSP 37-49   RA (rheumatoid arthritis) (HCC)    Rheumatic fever    Sepsis (HCC) 09/10/2020   a.) urosepsis --> BC x 2 sets and UC all grew out significant Proteus mirabilis; admitted to [facility] 09/07/2020 - 09/27/2020.   Sick sinus syndrome [facility])    a.) s/p MDT PPM placement 09/15/2020   T2DM (type 2 diabetes mellitus) (HCC)    Urinary retention    chronic, with indwelling Foley catheter and plans for a suprapubic   Wears dentures    full upper    Past Surgical History:  Procedure Laterality Date   AMPUTATION TOE Right 11/04/2022   Procedure: AMPUTATION TOE;  Surgeon: Felecia Shelling, [name];  [name];  Service: Podiatry;  Laterality: Right;  AMPUTATION TOE Right 09/15/2023   Procedure: AMPUTATION TOE;  Surgeon: Louann Sjogren, [name];  [name];  Service: Orthopedics/Podiatry;  Laterality: Right;  Right hallux amputation   BONE BIOPSY Right 04/05/2023   Procedure: BONE BIOPSY THIRD & FOURTH;  Surgeon: Felecia Shelling, [name];  [name];  Service: Podiatry;  Laterality: Right;   CARDIAC ELECTROPHYSIOLOGY STUDY AND ABLATION N/A 09/07/2020   Procedure: CARDIAC EP STUDY AND ABLATION (CTI)   CATARACT EXTRACTION W/PHACO Right 01/16/2017   Procedure: CATARACT EXTRACTION PHACO AND INTRAOCULAR LENS  PLACEMENT (IOC)  Right Complicated;  Surgeon: Lockie Mola, MD;  Location: [facility];  Service: Ophthalmology;  Laterality: Right;  IVA Block Healon 5 malyugin vision blue Diabetic - oral meds   CATARACT EXTRACTION W/PHACO Left 10/23/2022   Procedure: CATARACT EXTRACTION PHACO AND INTRAOCULAR LENS PLACEMENT (IOC) LEFT DIABETIC;  Surgeon: Galen Manila, MD;  Location: [facility];  Service: Ophthalmology;  Laterality: Left;  Diabetic   COLONOSCOPY     INCISION AND DRAINAGE OF WOUND Left 09/15/2023   Procedure: IRRIGATION AND DEBRIDEMENT WOUND;  Surgeon: Louann Sjogren, [name];  [name];  Service: Orthopedics/Podiatry;  Laterality: Left;  Left foot wound debridement, possible graft/biopsy   LOWER EXTREMITY ANGIOGRAPHY Right 11/02/2022   Procedure: Lower Extremity Angiography;  Surgeon: Annice Needy, MD;  Location: [facility] INV[facility]IVE [facility] LAB;  Service: Cardiovascular;  Laterality: Right;   LOWER EXTREMITY ANGIOGRAPHY Right 09/13/2023   Procedure: Lower Extremity Angiography;  Surgeon: Renford Dills, MD;  Location: [facility] INVAS[facility] CV LAB;  Service: Cardiovascular;  Laterality: Right;   METATARSAL HEAD EXCISION Left 07/05/2018   Procedure: RESECTION FIRST METATARSAL INFECTED BONE AND SOFT TISSUE;  Surgeon: Recardo Evangelist, [name];  [name];  Service: Podiatry;  Laterality: Left;   METATARSAL HEAD EXCISION Right 04/05/2023   Procedure: METATARSAL HEAD EXCISION THIRD & FOURTH;  Surgeon: Felecia Shelling, [name];  [name];  Service: Podiatry;  Laterality: Right;   PACEMAKER INSERTION  09/15/2020   TOE AMPUTATION Left    TONSILLECTOMY      Medications Prior to Admission  Medication Sig Dispense Refill Last Dose/Taking   acetaminophen (TYLENOL) 500 MG tablet Take 2 tablets (1,000 mg total) by mouth 3 (three) times daily as needed (for pain).   Taking As Needed   aspirin EC 81 MG tablet Take 1 tablet (81 mg total) by mouth daily. Swallow whole. 30 tablet 0  09/11/2023   bumetanide (BUMEX) 2 MG tablet Take 1 tablet (2 mg total) by mouth daily. 90 tablet 3 09/11/2023   Finerenone (KERENDIA) 10 MG TABS Take 1 tablet (10 mg total) by mouth daily. 30 tablet 2 09/11/2023 Morning   gentamicin cream (GARAMYCIN) 0.1 % Apply 1 Application topically 2 (two) times daily. 30 g 1 09/11/2023 Morning   glipiZIDE (GLUCOTROL) 10 MG tablet Take 1 tablet (10 mg total) by mouth 2 (two) times daily. 60 tablet 2 09/11/2023 Morning   metFORMIN (GLUCOPHAGE) 1000 MG tablet Take 1 tablet (1,000 mg total) by mouth 2 (two) times daily. 60 tablet 2 09/11/2023 Morning   metoprolol succinate (TOPROL-XL) 50 MG 24 hr tablet Take 1 tablet (50 mg total) by mouth daily. Take with or immediately following a meal. 30 tablet 2 09/11/2023   Multiple Vitamins-Minerals (MULTIVITAMIN WITH MINERALS) tablet Take 1 tablet by mouth daily.   09/11/2023   silver sulfADIAZINE (SILVADENE) 1 % cream Apply to affected area daily 50 g 1 09/11/2023   Social History   Socioeconomic History   Marital  status: Single    Spouse name: Not on file   Number of children: Not on file   Years of education: Not on file   Highest education level: Not on file  Occupational History    Comment: [employer]  Tobacco Use   Smoking status: Never   Smokeless tobacco: Never  Vaping Use   Vaping status: Never Used  Substance and Sexual Activity   Alcohol use: Not Currently    Comment: rarely   Drug use: Never   Sexual activity: Not Currently    Birth control/protection: None  Other Topics Concern   Not on file  Social History Narrative   Lives with roommate   Social Drivers of Health   Financial Resource Strain: Low Risk  (03/20/2023)   Received from [facility], [facility]   Overall Financial Resource Strain (CARDIA)    Difficulty of Paying Living Expenses: Not hard at all  Food Insecurity: No Food Insecurity (09/13/2023)   Hunger Vital Sign    Worried About Running Out of Food in the Last  Year: Never true    Ran Out of Food in the Last Year: Never true  Transportation Needs: Patient Declined (09/14/2023)   PRAPARE - Transportation    Lack of Transportation (Medical): Patient declined    Lack of Transportation (Non-Medical): Patient declined  Physical Activity: Not on file  Stress: No Stress Concern Present (09/07/2020)   Received from [facility], [facility]   [employer] of Occupational Health - Occupational Stress Questionnaire    Feeling of Stress : Not at all  Social Connections: Unknown (02/01/2022)   Received from [facility], [facility]   Social Network    Social Network: Not on file  Intimate Partner Violence: Patient Declined (09/14/2023)   Humiliation, Afraid, Rape, and Kick questionnaire    Fear of Current or Ex-Partner: Patient declined    Emotionally Abused: Patient declined    Physically Abused: Patient declined    Sexually Abused: Patient declined    Family History  Problem Relation Age of Onset   Cancer Niece      Vitals:   09/19/23 2342 09/20/23 0405 09/20/23 0436 09/20/23 0737  BP: (!) 110/58  112/72 (!) 116/58  Pulse: 70  63 62  Resp: 19  20 18   Temp: 98.5 F (36.9 C)  (!) 97.5 F (36.4 C) 98 F (36.7 C)  TempSrc: Oral     SpO2: 97%  100% 100%  Weight:  106.5 kg    Height:        PHYSICAL EXAM General: Chronically ill appearing male, well nourished, in no acute distress resting comfortably in hospital bed. HEENT: Normocephalic and atraumatic. Neck: No JVD.  Lungs: Normal respiratory effort on room air. Clear bilaterally to auscultation. No wheezes, crackles, rhonchi.  Heart: HRRR. Normal S1 and S2 without gallops or murmurs.  Abdomen: Non-distended appearing.  Msk: Normal strength and tone for age. Extremities: Warm and well perfused. No clubbing, cyanosis. Chronic appearing bilateral LE edema, bilateral foot ulcers. Edema appears improved. Neuro: Alert and oriented X 3. Psych: Answers questions appropriately.    Labs: Basic Metabolic Panel: Recent Labs    09/19/23 0419 09/20/23 0637  NA 137 137  K 3.7 3.8  CL 100 102  CO2 25 26  GLUCOSE 153* 134*  BUN 50* 52*  CREATININE 1.25* 1.19  CALCIUM 9.2 9.1   Liver Function Tests: No results for input(s): "AST", "ALT", "ALKPHOS", "BILITOT", "PROT", "ALBUMIN" in the last 72 hours. No results  for input(s): "LIPASE", "AMYLASE" in the last 72 hours. CBC: Recent Labs    09/19/23 0419 09/20/23 0637  WBC 7.2 6.4  NEUTROABS 4.6 3.8  HGB 9.6* 9.7*  HCT 30.7* 30.8*  MCV 86.5 84.6  PLT 259 234   Cardiac Enzymes: No results for input(s): "CKTOTAL", "CKMB", "CKMBINDEX", "TROPONINIHS" in the last 72 hours.  BNP: No results for input(s): "BNP" in the last 72 hours.  D-Dimer: No results for input(s): "DDIMER" in the last 72 hours. Hemoglobin A1C: No results for input(s): "HGBA1C" in the last 72 hours. Fasting Lipid Panel: No results for input(s): "CHOL", "HDL", "LDLCALC", "TRIG", "CHOLHDL", "LDLDIRECT" in the last 72 hours. Thyroid Function Tests: No results for input(s): "TSH", "T4TOTAL", "T3FREE", "THYROIDAB" in the last 72 hours.  Invalid input(s): "FREET3" Anemia Panel: No results for input(s): "VITAMINB12", "FOLATE", "FERRITIN", "TIBC", "IRON", "RETICCTPCT" in the last 72 hours.   Radiology: DG Foot 2 Views Right Result Date: 09/15/2023 CLINICAL DATA:  Right foot osteomyelitis. EXAM: RIGHT FOOT - 2 VIEW COMPARISON:  MRI 09/12/2023.  X-ray 09/11/2023 FINDINGS: Interval great toe amputation at the level of the MTP joint. Stable appearance third toe amputation at the mid metatarsal level with evidence of prior resection of the fourth metatarsal head. IMPRESSION: Interval great toe amputation at the level of the MTP joint. Electronically Signed   By: Kennith Center M.D.   On: 09/15/2023 14:04   PERIPHERAL VASCULAR CATHETERIZATION Result Date: 09/13/2023 See surgical note for result.  MR FOOT LEFT WO CONTRAST Result Date:  09/12/2023 CLINICAL DATA:  Plantar foot ulcer. EXAM: MRI OF THE LEFT FOOT WITHOUT CONTRAST TECHNIQUE: Multiplanar, multisequence MR imaging of the left forefoot was performed. No intravenous contrast was administered. COMPARISON:  Left foot x-rays from yesterday. CT left foot dated July 06, 2023. FINDINGS: Bones/Joint/Cartilage No suspicious marrow signal abnormality. Subtle periarticular marrow edema along the plantar aspect of the first TMT joint is also seen at the dorsal aspect of the joint and is most likely degenerative. 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 No fracture or dislocation. 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 Mild midfoot degenerative changes. No joint effusion. Ligaments Collateral ligaments are intact. Muscles and Tendons No tenosynovitis. Complete fatty atrophy of the intrinsic foot muscles. Soft tissue Ulceration along the medial plantar aspect of the midfoot near the base of the medial cuneiform. Diffuse soft tissue swelling. No fluid collection. No soft tissue mass. IMPRESSION: 1. Skin ulceration along the medial plantar aspect of the midfoot near the base of the medial cuneiform. No evidence of osteomyelitis or abscess. Electronically Signed   By: [name] M.D.   On: 09/12/2023 18:01   MR FOOT RIGHT WO CONTRAST Result Date: 09/12/2023 CLINICAL DATA:  Right foot ulcer. EXAM: MRI OF THE RIGHT FOREFOOT WITHOUT CONTRAST TECHNIQUE: Multiplanar, multisequence MR imaging of the right forefoot was performed. No intravenous contrast was administered. COMPARISON:  Right foot x-rays from yesterday. CT right foot dated July 06, 2023. FINDINGS: Bones/Joint/Cartilage Abnormal marrow edema involving the first proximal phalanx head and majority of the first distal phalanx. First IP joint effusion. Prior third ray distal fourth metatarsal amputations. No fracture or dislocation.  Unchanged severe first MTP joint osteoarthritis. No joint effusion. Ligaments Collateral ligaments are intact. Muscles and Tendons No tenosynovitis. Near complete fatty atrophy of the intrinsic muscles. Soft tissue Deep ulceration along the medial plantar aspect of the great toe with sinus tract extending to the IP joint. Diffuse  soft tissue swelling without focal fluid collection. Small ganglion cyst along the plantar aspect of the second metatarsal head. IMPRESSION: 1. Deep ulceration along the medial plantar aspect of the great toe with sinus tract extending to the IP joint. Associated septic arthritis and osteomyelitis of the first proximal and distal phalanges. No abscess. Electronically Signed   By: [name] M.D.   On: 09/12/2023 17:56   DG Foot 2 Views Right Result Date: 09/11/2023 CLINICAL DATA:  Diabetic foot ulcer EXAM: RIGHT FOOT - 2 VIEW COMPARISON:  None Available. FINDINGS: Transmetatarsal amputation of the right third toe and resection of the right fourth metatarsal head. Large ulcer is seen within the medial and plantar right great toe which extends to the cortex of the base of the distal phalanx with osseous erosion in this location in keeping with changes of osteomyelitis. There is extensive superimposed soft tissue swelling right great toe and second toe as well as moderate soft tissue swelling of the right forefoot diffusely. Vascular calcifications are noted. Advanced degenerative arthritis noted of the first metatarsophalangeal joint. No acute fracture or dislocation. IMPRESSION: 1. Large ulcer within the medial and plantar right great toe which extends to the cortex of the base of the distal phalanx with osseous erosion in this location in keeping with changes of osteomyelitis. 2. Transmetatarsal amputation of the right third toe and resection of the right fourth metatarsal head. Electronically Signed   By: Helyn Numbers M.D.   On: 09/11/2023 22:48   DG Foot 2 Views Left Result  Date: 09/11/2023 CLINICAL DATA:  Diabetic ulcer EXAM: LEFT FOOT - 2 VIEW COMPARISON:  None Available. FINDINGS: No acute fracture or dislocation. 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 There is a soft tissue wound within the soft tissues distal to the residual first metatarsal. No definite superimposed osseous erosion. There is diffuse soft tissue swelling of the second and third digits. Posttraumatic changes noted involving the second metatarsal head. Moderate midfoot and tibiotalar degenerative arthritis. Vascular calcifications are noted. IMPRESSION: 1. 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 Soft tissue wound within the soft tissues distal to the residual first metatarsal. No definite superimposed osseous erosion. 2. Diffuse soft tissue swelling of the second and third digits. Electronically Signed   By: Helyn Numbers M.D.   On: 09/11/2023 22:46   DG Chest 2 View Result Date: 09/11/2023 CLINICAL DATA:  Shortness of breath EXAM: CHEST - 2 VIEW COMPARISON:  08/13/2023 FINDINGS: Small bilateral pleural effusions. No frank interstitial edema. Mild vague right upper lobe opacity, suspicious for pneumonia. No pleural effusion or pneumothorax. Cardiomegaly.  Left subclavian pacemaker. Degenerative changes of the visualized thoracolumbar spine. IMPRESSION: Mild vague right upper lobe opacity, suspicious for pneumonia. Small bilateral pleural effusions. No frank interstitial edema. Electronically Signed   By: Charline Bills M.D.   On: 09/11/2023 18:09    ECHO 06/2023: 1. Left ventricular ejection fraction, by estimation, is 30 to 35%. The left ventricle has moderately decreased function. The left ventricle demonstrates regional wall motion abnormalities (see scoring diagram/findings for description). Indeterminate diastolic filling due to E-A fusion.   2. Right ventricular systolic function is normal. The right ventricular  size is normal.   3. The mitral valve is normal  in structure. Moderate mitral valve  regurgitation. No evidence of mitral stenosis.   4. The aortic valve is normal in structure. Aortic valve regurgitation is  not visualized. No aortic stenosis is present.   5. The inferior vena cava is normal  in size with greater than 50%  respiratory variability, suggesting right atrial pressure of 3 mmHg.   TELEMETRY reviewed by me 09/20/2023: ventricular pacing rate 80s  EKG reviewed by me: VP with PVCs rate 95 bpm  Data reviewed by me 09/20/2023: last 24h vitals tele labs imaging I/O hospitalist progress note  Active Problems:   Acute on chronic congestive heart failure (HCC)   Persistent atrial fibrillation (HCC)   HTN (hypertension)   Chronic kidney disease, stage 3a (HCC)   Acute on chronic heart failure with reduced ejection fraction (HFrEF,30-35 %) (HCC)   Moderate mitral regurgitation by prior echocardiogram   Nonischemic cardiomyopathy (HCC)   Status cardiac pacemaker   Anemia due to chronic kidney disease   Uncontrolled type 2 diabetes mellitus with hyperglycemia, without long-term current use of insulin (HCC)   Chronic bilateral pleural effusions   Complete heart block (HCC)  s/p PACEMAKER   Infected plantar ulcers both feet (HCC)   Osteomyelitis of right foot (HCC)   CAP (community acquired pneumonia)   Osteomyelitis of great toe of right foot (HCC)   Diabetic ulcer of left midfoot associated with type 2 diabetes mellitus, with fat layer exposed (HCC)    ASSESSMENT AND PLAN:  Tyler [name] is a 77 y.o. male  with a past medical history of  chronic HFrEF, persistent atrial fibrillation, mitral insufficiency, CHB s/p PPM 2021, hypertension, chronic kidney disease stage III  who presented to the ED on 09/11/2023 for shortness of breath. Cardiology was consulted for further evaluation.   # Acute on chronic heart failure reduced EF # Acute hypoxic respiratory failure # R great toe osteomyelitis s/p amputation Patient presented with  worsening SOB, LE edema. Also foot wounds with drainage. Seen at HF clinic yesterday, BNP 3500, sent to ED. BNP in ED 3800. Troponins 18 > 22. Has not required supplemental O2.  -Continue IV bumex 2 mg TID. Can consider transitioning to po in 1-2 days. -Continue losartan 25 mg daily, metoprolol succinate 50 mg daily and finerenone 10 mg daily. Did not tolerate Entresto. -Mild and flat troponin elevation most consistent with demand/supply mismatch and not ACS in the setting of acute heart failure. -No plan for further cardiac diagnostics at this time.   # CHB s/p Medtronic dual chamber PPM 08/2020 # Hx atrial fibrillation Patient with history of persistent atrial fibrillation previously on eliquis, this was discontinued due to hematuria in 2023. Underwent PPM implant in 2021 for CHB, has had regular device checks since implant.  -Beta-blocker as above for rate control -Recommend anticoagulation however patient has historically deferred given prior bleeding issues.  # Chronic kidney disease stage IIIa Patient with hx of CKD, baseline Cr 1.3-1.4. Cr this AM 1.19. -Continue to monitor renal function closely during diuresis.  This patient's plan of care was discussed and created with Dr. Darrold Junker and he is in agreement.  Signed: Gale Journey, PA-C  09/20/2023, 10:19 AM [facility] Cardiology

## 2023-09-20 NOTE — Plan of Care (Signed)
  Problem: Coping: Goal: Ability to adjust to condition or change in health will improve Outcome: Progressing   Problem: Fluid Volume: Goal: Ability to maintain a balanced intake and output will improve Outcome: Progressing   Problem: Education: Goal: Ability to demonstrate management of disease process will improve Outcome: Progressing   Problem: Education: Goal: Understanding of CV disease, CV risk reduction, and recovery process will improve Outcome: Progressing   Problem: Health Behavior/Discharge Planning: Goal: Ability to safely manage health-related needs after discharge will improve Outcome: Progressing

## 2023-09-20 NOTE — Plan of Care (Signed)
  Problem: Education: Goal: Ability to describe self-care measures that may prevent or decrease complications (Diabetes Survival Skills Education) will improve Outcome: Progressing Goal: Individualized Educational Video(s) Outcome: Progressing   Problem: Coping: Goal: Ability to adjust to condition or change in health will improve Outcome: Progressing   Problem: Fluid Volume: Goal: Ability to maintain a balanced intake and output will improve Outcome: Progressing   Problem: Health Behavior/Discharge Planning: Goal: Ability to identify and utilize available resources and services will improve Outcome: Progressing Goal: Ability to manage health-related needs will improve Outcome: Progressing   Problem: Metabolic: Goal: Ability to maintain appropriate glucose levels will improve Outcome: Progressing   Problem: Nutritional: Goal: Maintenance of adequate nutrition will improve Outcome: Progressing Goal: Progress toward achieving an optimal weight will improve Outcome: Progressing   Problem: Skin Integrity: Goal: Risk for impaired skin integrity will decrease Outcome: Progressing   Problem: Tissue Perfusion: Goal: Adequacy of tissue perfusion will improve Outcome: Progressing   Problem: Education: Goal: Ability to demonstrate management of disease process will improve Outcome: Progressing Goal: Ability to verbalize understanding of medication therapies will improve Outcome: Progressing Goal: Individualized Educational Video(s) Outcome: Progressing   Problem: Activity: Goal: Capacity to carry out activities will improve Outcome: Progressing   Problem: Cardiac: Goal: Ability to achieve and maintain adequate cardiopulmonary perfusion will improve Outcome: Progressing   Problem: Education: Goal: Understanding of CV disease, CV risk reduction, and recovery process will improve Outcome: Progressing Goal: Individualized Educational Video(s) Outcome: Progressing    Problem: Activity: Goal: Ability to return to baseline activity level will improve Outcome: Progressing   Problem: Cardiovascular: Goal: Ability to achieve and maintain adequate cardiovascular perfusion will improve Outcome: Progressing Goal: Vascular access site(s) Level 0-1 will be maintained Outcome: Progressing   Problem: Health Behavior/Discharge Planning: Goal: Ability to safely manage health-related needs after discharge will improve Outcome: Progressing   Problem: Education: Goal: Knowledge of General Education information will improve Description: Including pain rating scale, medication(s)/side effects and non-pharmacologic comfort measures Outcome: Progressing   Problem: Health Behavior/Discharge Planning: Goal: Ability to manage health-related needs will improve Outcome: Progressing   Problem: Clinical Measurements: Goal: Ability to maintain clinical measurements within normal limits will improve Outcome: Progressing Goal: Will remain free from infection Outcome: Progressing Goal: Diagnostic test results will improve Outcome: Progressing Goal: Respiratory complications will improve Outcome: Progressing Goal: Cardiovascular complication will be avoided Outcome: Progressing   Problem: Activity: Goal: Risk for activity intolerance will decrease Outcome: Progressing   Problem: Nutrition: Goal: Adequate nutrition will be maintained Outcome: Progressing   Problem: Coping: Goal: Level of anxiety will decrease Outcome: Progressing   Problem: Elimination: Goal: Will not experience complications related to bowel motility Outcome: Progressing Goal: Will not experience complications related to urinary retention Outcome: Progressing   Problem: Pain Management: Goal: General experience of comfort will improve Outcome: Progressing   Problem: Safety: Goal: Ability to remain free from injury will improve Outcome: Progressing   Problem: Skin Integrity: Goal:  Risk for impaired skin integrity will decrease Outcome: Progressing

## 2023-09-20 NOTE — Progress Notes (Addendum)
Physical Therapy Treatment Patient Details Name: Tyler [name] MRN: [id number] DOB: 1946-05-08 Today's Date: 09/20/2023   History of Present Illness Pt is a 77 y.o. male presenting to hospital from heart failure clinic d/t SOB, 10 pound weight gain last 2 weeks, worsening B LE edema.  Pt noted with wounds on B feet.  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  09/13/23 s/p R LE angio with stent placement.  09/15/23 s/p R hallux amputation and I&D L foot.   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    PT Comments  Pt seen for PT tx with pt agreeable. Pt has orders for BLE post op shoes but none in room, notified nurse & asked her to contact supply so pt can get post op shoes. Reviewed weight bearing precautions with pt; educated pt on use of post op shoes as well as BLE heel weight bearing with pt voicing understanding. Pt reports he lives with a roommate but can d/c to his daughter's house where she has less steps to access the home & PT encouraged this. PT educated pt on need to minimize walking to allow BLE to heal & pt voices understanding. Pt ambulates ~10 ft in room with RW & supervision fade to mod I with pt reporting he's weight bearing through BLE heels. Pt reports he has good understanding of precautions & need to minimize gait.   Addendum: Nurse reports pt received post op shoes. Returned to room & assisted pt with donning BLE post op shoes to ensure they fit. Pt appreciative, politely declining gait attempts in post op shoes, pt on the phone. Will f/u as able.   If plan is discharge home, recommend the following: A little help with walking and/or transfers;A little help with bathing/dressing/bathroom;Assist for  transportation;Help with stairs or ramp for entrance   Can travel by private vehicle     No  Equipment Recommendations  BSC/3in1;Wheelchair (measurements PT);Wheelchair cushion (measurements PT);Other (comment);Rolling walker (2 wheels)    Recommendations for Other Services       Precautions / Restrictions Precautions Precautions: Fall Precaution Comments: B post op shoes Restrictions Weight Bearing Restrictions Per Provider Order: Yes RLE Weight Bearing Per Provider Order: Partial weight bearing RLE Partial Weight Bearing Percentage or Pounds: heel weight bearing to right foot LLE Weight Bearing Per Provider Order: Partial weight bearing LLE Partial Weight Bearing Percentage or Pounds: minimal weight bearing left foot, weight bearing towards heel     Mobility  Bed Mobility               General bed mobility comments: not tested, pt received & left sitting in recliner    Transfers Overall transfer level: Needs assistance Equipment used: Rolling walker (2 wheels) Transfers: Sit to/from Stand Sit to Stand: Supervision           General transfer comment: STS from recliner    Ambulation/Gait Ambulation/Gait assistance: Supervision, Modified independent (Device/Increase time) Gait Distance (Feet): 10 Feet Assistive device: Rolling walker (2 wheels) Gait Pattern/deviations: Decreased stride length, Decreased step length - right, Decreased step length - left Gait velocity: decreased     General Gait Details: PT continues to educate pt on need to weight bear through B heels with pt reporting he's doing so  Stairs             Wheelchair Mobility     Tilt Bed    Modified Rankin (Stroke Patients Only)       Balance Overall balance assessment: Needs assistance Sitting-balance support: No upper extremity supported, Feet supported Sitting balance-Leahy Scale: Good     Standing balance support: During functional activity, Bilateral upper extremity  supported Standing balance-Leahy Scale: Good                              Cognition Arousal: Alert Behavior During Therapy: WFL for tasks assessed/performed Overall Cognitive Status: Within Functional Limits for tasks assessed                                          Exercises      General Comments General comments (skin integrity, edema, etc.): ace wraps on B feet      Pertinent Vitals/Pain Pain Assessment Pain Assessment: No/denies pain    Home Living                          Prior Function            PT Goals (current goals can now be found in the care plan section) Acute Rehab PT Goals Patient Stated Goal: to go home PT Goal Formulation: With patient Time For Goal Achievement: 09/30/23 Potential to Achieve Goals: Fair Additional Goals Additional Goal #1: Pt modified independent with w/c propulsion x120 feet Progress towards PT goals: Progressing toward goals    Frequency    Min 1X/week      PT Plan      Co-evaluation              AM-PAC PT "6 Clicks" Mobility   Outcome Measure  Help needed turning from your back to your side while in a flat bed without using bedrails?: None Help needed moving from lying on your back to sitting on the side of a flat bed without using bedrails?: None Help needed moving to and from a bed to a chair (including a wheelchair)?: A Little Help needed standing up from a chair using your arms (e.g., wheelchair or bedside chair)?: None Help needed to walk in hospital room?: None Help needed climbing 3-5 steps with a railing? : A Little 6 Click Score: 22    End of Session   Activity Tolerance: Patient tolerated treatment well Patient left: in chair;with call bell/phone within reach Nurse Communication:  (need for bilateral post op shoes) PT Visit Diagnosis: Other abnormalities of gait and mobility (R26.89);Muscle weakness (generalized) (M62.81)     Time: 6578-4696 PT Time  Calculation (min) (ACUTE ONLY): 14 min  Charges:    $Therapeutic Activity: 8-22 mins PT General Charges $$ ACUTE PT VISIT: 1 Visit                     Aleda Grana, PT, DPT 09/20/23, 2:31 PM   Sandi Mariscal 09/20/2023, 1:43 PM

## 2023-09-20 NOTE — Progress Notes (Signed)
PROGRESS NOTE    Tyler [name]  ZOX:[id number] DOB: 1946-01-20 DOA: 09/11/2023 PCP: Elder Negus, NP    Brief Narrative:   From H&P "Tyler [name] is a 77 y.o. 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  He presented to the hospital because of cough productive of yellowish phlegm, increasing shortness of breath, orthopnea, easy fatigability, about 10 pound weight gain in a week.  He was hospitalized on 08/13/2023 for CHF exacerbation.  His diuretic regimen had been adjusted in the outpatient setting but his symptoms did not improve. He also complained of oozing from the wounds on the bottom of his feet.   He was admitted to the hospital for acute exacerbation of chronic systolic CHF, community-acquired pneumonia, infected plantar ulcers on bilateral feet and right foot osteomyelitis.  Dr. Annamary Rummage, podiatrist, was consulted for right foot osteomyelitis.  He recommended MRI of the foot for further evaluation.  MRI of the left foot showed plantar surface ulcer without evidence of osteomyelitis or abscess.  MRI of the right foot showed septic arthritis of right hallux IPJ, and OM of the proximal and distal phalanx.  Podiatry was consulted who recommended right hallux amputation versus ray resection and left foot ulcer debridement.  Patient was also seen by vascular surgery who performed right lower extremity arteriogram.  Patient underwent angioplasty and stent placement of right SFA and popliteal arteries 12/20.  "   Assessment & Plan:   Active Problems:   Acute on chronic heart failure with reduced ejection fraction (HFrEF,30-35 %) (HCC)   Moderate mitral  regurgitation by prior echocardiogram   Nonischemic cardiomyopathy (HCC)   Chronic bilateral pleural effusions   CAP (community acquired pneumonia)   Persistent atrial fibrillation (HCC)   Infected plantar ulcers both feet (HCC)   Osteomyelitis of right foot (HCC)   HTN (hypertension)   Uncontrolled type 2 diabetes mellitus with hyperglycemia, without long-term current use of insulin (HCC)   Complete heart block (HCC)  s/p PACEMAKER   Chronic kidney disease, stage 3a (HCC)   Anemia due to chronic kidney disease   Acute on chronic congestive heart failure (HCC)   Status cardiac pacemaker   Osteomyelitis of great toe of right foot (HCC)   Diabetic ulcer of left midfoot associated with type 2 diabetes mellitus, with fat layer exposed (HCC)   Acute on chronic heart failure with reduced ejection fraction (HFrEF, 30-35 %) (HCC) Nonischemic cardiomyopathy Bilateral pleural effusions Moderate mitral regurgitation Patient failing outpatient management Cardiology consulted appreciate recommendations Last echo from October with EF 30 to 35% Plan: Per cardiology recommendations continue IV Bumex 2 mg 3 times daily for additional 1 to 2 days prior to transition.  Hold Entresto as patient did not tolerate.  GDMT as tolerated with metoprolol.  Daily weights, strict ins and outs    CAP (community acquired pneumonia) Patient with several week history of productive cough Patient with low-grade temp of 99 leukocytosis of 11,000 chest x-ray showing right upper lobe opacity suspicious for pneumonia Received Rocephin and azithromycin in the ED Has completed antibiotic course No further antibiotics.  Monitor vitals and fever curve     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  S/p R great toe amp and L foot wound I&D w/ wound graft with podiatry 12/22 - Per  podiatry " -WB Status: May be heel weight bearing on the right foot and partial minimal weight bearing on the left with most weight toward to heel. . -Medications/ABX: 2 weeks doxycyline on discharge -Dressing to remain c/d/I until follow up in 1 weeks - Return in 1 week to [place] office on 12/30 with Dr. Ralene Cork."     H/o Persistent atrial fibrillation (HCC) Continue metoprolol Patient off of Eliquis due to hematuria. (per review of heart failure clinic note from earlier in the day) Patient has been previously referred for ablation Continue aspirin   Complete heart block (HCC)   s/p PACEMAKER In 2021. No acute issues   Uncontrolled type 2 diabetes mellitus with hyperglycemia, without long-term current use of insulin (HCC) A1c 7.2 2 months ago.  Continue insulin therapy   HTN (hypertension) BP controlled Continue metoprolol   Chronic kidney disease, stage 3a (HCC) Renal function at baseline   Anemia due to chronic kidney disease Hemoglobin at baseline  DVT prophylaxis: Lovenox Code Status: Full Family Communication: None Disposition Plan: Status is: Inpatient Remains inpatient appropriate because: Decompensated heart failure on IV diuretic   Level of care: Progressive  Consultants:  Cardiology-[facility]  Procedures:  None  Antimicrobials: None   Subjective: Seen and examined.  Sitting up in chair.  Feels well.  No complaints.  Objective: Vitals:   09/20/23 0405 09/20/23 0436 09/20/23 0737 09/20/23 1206  BP:  112/72 (!) 116/58 112/73  Pulse:  63 62 74  Resp:  20 18 17   Temp:  (!) 97.5 F (36.4 C) 98 F (36.7 C) 98.1 F (36.7 C)  TempSrc:      SpO2:  100% 100% 99%  Weight: 106.5 kg     Height:        Intake/Output Summary (Last 24 hours) at 09/20/2023 1340 Last data filed at 09/20/2023 0900 Gross per 24 hour  Intake 240 ml  Output 2300 ml  Net -2060 ml   Filed Weights   09/17/23 0500 09/19/23 0500 09/20/23 0405  Weight: 111.6 kg 108.7 kg 106.5 kg     Examination:  General exam: NAD Respiratory system: Clear to auscultation. Respiratory effort normal. Cardiovascular system: S1-S2, RRR, no murmurs, trace pedal edema Gastrointestinal system: Left, NT/ND, normal bowel sounds Central nervous system: Alert and oriented. No focal neurological deficits. Extremities: Symmetric 5 x 5 power. Skin: No rashes, lesions or ulcers Psychiatry: Judgement and insight appear normal. Mood & affect appropriate.     Data Reviewed: I have personally reviewed following labs and imaging studies  CBC: Recent Labs  Lab 09/16/23 0420 09/17/23 0310 09/18/23 0628 09/19/23 0419 09/20/23 0637  WBC 8.1 7.4 6.8 7.2 6.4  NEUTROABS  --   --  4.4 4.6 3.8  HGB 10.1* 9.1* 9.6* 9.6* 9.7*  HCT 33.1* 28.6* 29.8* 30.7* 30.8*  MCV 87.3 84.1 83.7 86.5 84.6  PLT 252 231 219 259 234   Basic Metabolic Panel: Recent Labs  Lab 09/14/23 0258 09/15/23 0235 09/16/23 0420 09/17/23 0310 09/18/23 0628 09/19/23 0419 09/20/23 0637  NA 137 134* 138 135 137 137 137  K 3.5 3.4* 3.7 3.4* 3.6 3.7 3.8  CL 104 102 104 104 102 100 102  CO2 23 23 23 23 25 25 26   GLUCOSE 184* 214* 160* 141* 166* 153* 134*  BUN 52* 55* 51* 49* 50* 50* 52*  CREATININE 1.31* 1.32* 1.37* 1.13 1.16 1.25* 1.19  CALCIUM 8.8* 8.6* 8.8* 8.6* 8.9 9.2 9.1  MG 1.9 2.2 2.0  --   --   --   --    GFR: Estimated Creatinine Clearance: 65.1 mL/min (by C-G formula based on SCr of 1.19 mg/dL). Liver Function Tests: No results for input(s): "AST", "ALT", "ALKPHOS", "BILITOT", "PROT", "ALBUMIN" in the last 168 hours. No results for input(s): "LIPASE", "AMYLASE" in the last 168 hours. No results for input(s): "AMMONIA" in the last 168 hours. Coagulation Profile: No results for input(s): "INR", "PROTIME" in the last 168 hours. Cardiac Enzymes: No results for input(s): "CKTOTAL", "CKMB", "CKMBINDEX", "TROPONINI" in the last 168 hours. BNP (last 3 results) No results for input(s): "PROBNP" in the last 8760  hours. HbA1C: No results for input(s): "HGBA1C" in the last 72 hours. CBG: Recent Labs  Lab 09/19/23 1307 09/19/23 1643 09/19/23 2145 09/20/23 0738 09/20/23 1209  GLUCAP 294* 137* 148* 142* 193*   Lipid Profile: No results for input(s): "CHOL", "HDL", "LDLCALC", "TRIG", "CHOLHDL", "LDLDIRECT" in the last 72 hours. Thyroid Function Tests: No results for input(s): "TSH", "T4TOTAL", "FREET4", "T3FREE", "THYROIDAB" in the last 72 hours. Anemia Panel: No results for input(s): "VITAMINB12", "FOLATE", "FERRITIN", "TIBC", "IRON", "RETICCTPCT" in the last 72 hours. Sepsis Labs: No results for input(s): "PROCALCITON", "LATICACIDVEN" in the last 168 hours.  Recent Results (from the past 240 hours)  Resp panel by RT-PCR (RSV, Flu A&B, Covid) Anterior Nasal Swab     Status: None   Collection Time: 09/12/23  3:13 AM   Specimen: Anterior Nasal Swab  Result Value Ref Range Status   SARS Coronavirus 2 by RT PCR NEGATIVE NEGATIVE Final    Comment: (NOTE) SARS-CoV-2 target nucleic acids are NOT DETECTED.  The SARS-CoV-2 RNA is generally detectable in upper respiratory specimens during the acute phase of infection. The lowest concentration of SARS-CoV-2 viral copies this assay can detect is 138 copies/mL. A negative result does not preclude SARS-Cov-2 infection and should not be used as the sole basis for treatment or other patient management decisions. A negative result may occur with  improper specimen collection/handling, submission of specimen other than nasopharyngeal swab, presence of viral mutation(s) within the areas targeted by this assay, and inadequate number of viral copies(<138 copies/mL). A negative result must be combined with clinical observations, patient history, and epidemiological information. The expected result is Negative.  Fact Sheet for Patients:  BloggerCourse.com  Fact Sheet for Healthcare Providers:   SeriousBroker.it  This test is no t yet approved or cleared by the [place] FDA and  has been authorized for detection and/or diagnosis of SARS-CoV-2 by FDA under an Emergency Use Authorization (EUA). This EUA will remain  in effect (meaning this test can be used) for the duration of the COVID-19 declaration under Section 564(b)(1) of the Act, 21 U.S.C.section 360bbb-3(b)(1), unless the authorization is terminated  or revoked sooner.       Influenza A by PCR NEGATIVE NEGATIVE Final   Influenza B by PCR NEGATIVE NEGATIVE Final    Comment: (NOTE) The Xpert Xpress SARS-CoV-2/FLU/RSV plus assay is intended as an aid in the diagnosis of influenza from Nasopharyngeal swab specimens and should not be used as a sole basis for treatment. Nasal washings and aspirates are unacceptable for Xpert Xpress SARS-CoV-2/FLU/RSV testing.  Fact Sheet for Patients: BloggerCourse.com  Fact Sheet for Healthcare Providers: SeriousBroker.it  This test is not yet approved or cleared by the [place] FDA and has been authorized for detection  and/or diagnosis of SARS-CoV-2 by FDA under an Emergency Use Authorization (EUA). This EUA will remain in effect (meaning this test can be used) for the duration of the COVID-19 declaration under Section 564(b)(1) of the Act, 21 U.S.C. section 360bbb-3(b)(1), unless the authorization is terminated or revoked.     Resp Syncytial Virus by PCR NEGATIVE NEGATIVE Final    Comment: (NOTE) Fact Sheet for Patients: BloggerCourse.com  Fact Sheet for Healthcare Providers: SeriousBroker.it  This test is not yet approved or cleared by the [place] FDA and has been authorized for detection and/or diagnosis of SARS-CoV-2 by FDA under an Emergency Use Authorization (EUA). This EUA will remain in effect (meaning this test can be used) for  the duration of the COVID-19 declaration under Section 564(b)(1) of the Act, 21 U.S.C. section 360bbb-3(b)(1), unless the authorization is terminated or revoked.  Performed at [facility], [address] [place], [place] [address]   Aerobic Culture w Gram Stain (superficial specimen)     Status: None   Collection Time: 09/15/23  9:27 AM   Specimen: Wound  Result Value Ref Range Status   Specimen Description   Final    WOUND Performed at [facility], [address] [place], [place] [address]    Special Requests RIGHT GREAT toe  Final   Gram Stain RARE WBC SEEN RARE GRAM POSITIVE COCCI   Final   Culture   Final    FEW METHICILLIN RESISTANT STAPHYLOCOCCUS AUREUS WITHIN MIXED SKIN FLORA Performed at [facility], [address] [place], [place] [address]    Report Status 09/18/2023 FINAL  Final   Organism ID, Bacteria METHICILLIN RESISTANT STAPHYLOCOCCUS AUREUS  Final      Susceptibility   Methicillin resistant staphylococcus aureus - MIC*    CIPROFLOXACIN >=8 RESISTANT Resistant     ERYTHROMYCIN >=8 RESISTANT Resistant     GENTAMICIN <=0.5 SENSITIVE Sensitive     OXACILLIN >=4 RESISTANT Resistant     TETRACYCLINE <=1 SENSITIVE Sensitive     VANCOMYCIN <=0.5 SENSITIVE Sensitive     TRIMETH/SULFA <=10 SENSITIVE Sensitive     CLINDAMYCIN <=0.25 SENSITIVE Sensitive     RIFAMPIN <=0.5 SENSITIVE Sensitive     Inducible Clindamycin NEGATIVE Sensitive     LINEZOLID 2 SENSITIVE Sensitive     * FEW METHICILLIN RESISTANT STAPHYLOCOCCUS AUREUS  Aerobic Culture w Gram Stain (superficial specimen)     Status: None   Collection Time: 09/15/23  9:28 AM   Specimen: Wound  Result Value Ref Range Status   Specimen Description BONE  Final   Special Requests right great toe  Final   Gram Stain NO WBC SEEN NO ORGANISMS SEEN   Final   Culture   Final    NO GROWTH 2 DAYS Performed at [facility], [address], [place], [place] [address]    Report  Status 09/17/2023 FINAL  Final         Radiology Studies: No results found.      Scheduled Meds:  aspirin EC  81 mg Oral Daily   atorvastatin  10 mg Oral q1800   bumetanide (BUMEX) IV  2 mg Intravenous Q8H   Chlorhexidine Gluconate Cloth  6 each Topical Daily   clopidogrel  75 mg Oral Q breakfast   dextromethorphan-guaiFENesin  1 tablet Oral BID   doxycycline  100 mg Oral Q12H   enoxaparin (LOVENOX) injection  60 mg Subcutaneous Q24H   insulin aspart  0-20 Units Subcutaneous TID WC  insulin aspart  0-5 Units Subcutaneous QHS   insulin glargine-yfgn  8 Units Subcutaneous QHS   lidocaine  1 patch Transdermal Q24H   losartan  25 mg Oral Daily   metoprolol succinate  50 mg Oral Daily   polyethylene glycol  17 g Oral Daily   senna-docusate  1 tablet Oral BID   sodium chloride flush  3 mL Intravenous Q12H   Continuous Infusions:   LOS: 9 days       Tresa Moore, MD [facility]   If 7PM-7AM, please contact night-coverage  09/20/2023, 1:40 PM

## 2023-09-20 NOTE — Progress Notes (Addendum)
Occupational Therapy Treatment Patient Details Name: Tyler [name] MRN: [id number] DOB: 1946-07-20 Today's Date: 09/20/2023   History of present illness Pt is a 77 y.o. male presenting to hospital from heart failure clinic d/t SOB, 10 pound weight gain last 2 weeks, worsening B LE edema.  Pt noted with wounds on B feet.  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  09/13/23 s/p R LE angio with stent placement.  09/15/23 s/p R hallux amputation and I&D L foot.   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   OT comments  Education was provided about A/E use for LE ADLs.  Pt. reports having reachers at home. Pt. reports being  independent donning his pants this morning without assistance, or equipment. Pt. reports independently transferring to the chair this morning. Pt. education was provided/reveiwed about BLE precautions. Pt. reports that he would like to plan on leaving to return home as soon as possible. Attempted to review any anticipated ADL/IADL needs upon returning home, however Pt. reports that he is used to figuring things out on his own, and doing what needs to be done. Pt. Continues to benefit from OT services for ADL training, A/E training, and pt. Education about home modification, and DME.       If plan is discharge home, recommend the following:      Equipment Recommendations       Recommendations for Other Services      Precautions / Restrictions Precautions Precautions: Fall Restrictions Weight Bearing Restrictions Per Provider Order: Yes RLE Weight Bearing Per Provider Order: Partial weight bearing RLE Partial Weight Bearing Percentage or Pounds: heel weight bearing to right foot LLE Weight Bearing Per  Provider Order: Partial weight bearing LLE Partial Weight Bearing Percentage or Pounds: minimal weight bearing left foot       Mobility Bed Mobility               General bed mobility comments: Pt. sitting up in recliner chair upon arrival.    Transfers                         Balance                                           ADL either performed or assessed with clinical judgement   ADL                         Lower Body Dressing Details (indicate cue type and reason): anticipate Pt. Requiring cues for precautions.                     Extremity/Trunk Assessment Upper Extremity Assessment Upper Extremity Assessment: Right hand dominant;Overall WFL for tasks assessed            Vision Baseline Vision/History: 1 Wears glasses     Perception     Praxis      Cognition Arousal: Alert Behavior During Therapy: WFL for tasks assessed/performed Overall Cognitive Status: Within Functional Limits for tasks assessed  Exercises      Shoulder Instructions       General Comments      Pertinent Vitals/ Pain       Pain Assessment Pain Assessment: No/denies pain  Home Living                                          Prior Functioning/Environment              Frequency  Min 1X/week        Progress Toward Goals  OT Goals(current goals can now be found in the care plan section)  Progress towards OT goals: Progressing toward goals  Acute Rehab OT Goals OT Goal Formulation: With patient Time For Goal Achievement: 09/30/23 Potential to Achieve Goals: Good  Plan      Co-evaluation                 AM-PAC OT "6 Clicks" Daily Activity     Outcome Measure   Help from another person eating meals?: None Help from another person taking care of personal grooming?: None Help from another person toileting, which includes using  toliet, bedpan, or urinal?: A Little Help from another person bathing (including washing, rinsing, drying)?: A Lot Help from another person to put on and taking off regular upper body clothing?: None Help from another person to put on and taking off regular lower body clothing?: A Little 6 Click Score: 20    End of Session    OT Visit Diagnosis: Unsteadiness on feet (R26.81);Pain;Muscle weakness (generalized) (M62.81);Other abnormalities of gait and mobility (R26.89)   Activity Tolerance Patient tolerated treatment well   Patient Left in bed;with call bell/phone within reach;with bed alarm set;with nursing/sitter in room   Nurse Communication Mobility status;Weight bearing status;Patient requests pain meds;Precautions        Time: 1030-1045 OT Time Calculation (min): 15 min  Charges: OT General Charges $OT Visit: 1 Visit OT Treatments $Self Care/Home Management : 8-22 mins  Olegario Messier, MS, OTR/L   Olegario Messier 09/20/2023, 12:49 PM

## 2023-09-20 NOTE — Progress Notes (Signed)
Patients order states do not change dressings. Bilateral dressings to feet CDI. Encouraged patient to elevate his legs while sitting in high back recliner. Denies pain or discomfort to bilateral feet, or legs this am.

## 2023-09-21 DIAGNOSIS — I509 Heart failure, unspecified: Secondary | ICD-10-CM | POA: Diagnosis not present

## 2023-09-21 DIAGNOSIS — I5023 Acute on chronic systolic (congestive) heart failure: Secondary | ICD-10-CM | POA: Diagnosis not present

## 2023-09-21 LAB — BASIC METABOLIC PANEL
Anion gap: 12 (ref 5–15)
BUN: 60 mg/dL — ABNORMAL HIGH (ref 8–23)
CO2: 25 mmol/L (ref 22–32)
Calcium: 9 mg/dL (ref 8.9–10.3)
Chloride: 98 mmol/L (ref 98–111)
Creatinine, Ser: 1.37 mg/dL — ABNORMAL HIGH (ref 0.61–1.24)
GFR, Estimated: 52 mL/min — ABNORMAL LOW (ref >60)
Glucose, Bld: 144 mg/dL — ABNORMAL HIGH (ref 70–99)
Potassium: 3.7 mmol/L (ref 3.5–5.1)
Sodium: 135 mmol/L (ref 135–145)

## 2023-09-21 LAB — MAGNESIUM: Magnesium: 1.9 mg/dL (ref 1.7–2.4)

## 2023-09-21 LAB — GLUCOSE, CAPILLARY
Glucose-Capillary: 142 mg/dL — ABNORMAL HIGH (ref 70–99)
Glucose-Capillary: 220 mg/dL — ABNORMAL HIGH (ref 70–99)

## 2023-09-21 MED ORDER — ATORVASTATIN CALCIUM 10 MG PO TABS: 10.0000 mg | ORAL_TABLET | Freq: Every day | ORAL | 1 refills | Status: DC

## 2023-09-21 MED ORDER — LOSARTAN POTASSIUM 25 MG PO TABS: 25.0000 mg | ORAL_TABLET | Freq: Every day | ORAL | 1 refills | Status: DC

## 2023-09-21 MED ORDER — CLOPIDOGREL BISULFATE 75 MG PO TABS: 75.0000 mg | ORAL_TABLET | Freq: Every day | ORAL | 1 refills | Status: DC

## 2023-09-21 MED ORDER — ENOXAPARIN SODIUM 60 MG/0.6ML IJ SOSY: 0.5000 mg/kg | PREFILLED_SYRINGE | INTRAMUSCULAR | Status: DC

## 2023-09-21 MED ORDER — DOXYCYCLINE HYCLATE 100 MG PO TABS: 100.0000 mg | ORAL_TABLET | Freq: Two times a day (BID) | ORAL | 0 refills | Status: DC

## 2023-09-21 NOTE — Plan of Care (Signed)
  Problem: Education: Goal: Ability to describe self-care measures that may prevent or decrease complications (Diabetes Survival Skills Education) will improve Outcome: Adequate for Discharge Goal: Individualized Educational Video(s) Outcome: Adequate for Discharge   Problem: Coping: Goal: Ability to adjust to condition or change in health will improve Outcome: Adequate for Discharge   Problem: Fluid Volume: Goal: Ability to maintain a balanced intake and output will improve Outcome: Adequate for Discharge   Problem: Health Behavior/Discharge Planning: Goal: Ability to identify and utilize available resources and services will improve Outcome: Adequate for Discharge Goal: Ability to manage health-related needs will improve Outcome: Adequate for Discharge   Problem: Metabolic: Goal: Ability to maintain appropriate glucose levels will improve Outcome: Adequate for Discharge   Problem: Nutritional: Goal: Maintenance of adequate nutrition will improve Outcome: Adequate for Discharge Goal: Progress toward achieving an optimal weight will improve Outcome: Adequate for Discharge   Problem: Skin Integrity: Goal: Risk for impaired skin integrity will decrease Outcome: Adequate for Discharge   Problem: Tissue Perfusion: Goal: Adequacy of tissue perfusion will improve Outcome: Adequate for Discharge   Problem: Education: Goal: Ability to demonstrate management of disease process will improve Outcome: Adequate for Discharge Goal: Ability to verbalize understanding of medication therapies will improve Outcome: Adequate for Discharge Goal: Individualized Educational Video(s) Outcome: Adequate for Discharge   Problem: Activity: Goal: Capacity to carry out activities will improve Outcome: Adequate for Discharge   Problem: Cardiac: Goal: Ability to achieve and maintain adequate cardiopulmonary perfusion will improve Outcome: Adequate for Discharge   Problem: Education: Goal:  Understanding of CV disease, CV risk reduction, and recovery process will improve Outcome: Adequate for Discharge Goal: Individualized Educational Video(s) Outcome: Adequate for Discharge   Problem: Activity: Goal: Ability to return to baseline activity level will improve Outcome: Adequate for Discharge   Problem: Cardiovascular: Goal: Ability to achieve and maintain adequate cardiovascular perfusion will improve Outcome: Adequate for Discharge Goal: Vascular access site(s) Level 0-1 will be maintained Outcome: Adequate for Discharge   Problem: Health Behavior/Discharge Planning: Goal: Ability to safely manage health-related needs after discharge will improve Outcome: Adequate for Discharge   Problem: Education: Goal: Knowledge of General Education information will improve Description: Including pain rating scale, medication(s)/side effects and non-pharmacologic comfort measures Outcome: Adequate for Discharge   Problem: Health Behavior/Discharge Planning: Goal: Ability to manage health-related needs will improve Outcome: Adequate for Discharge   Problem: Clinical Measurements: Goal: Ability to maintain clinical measurements within normal limits will improve Outcome: Adequate for Discharge Goal: Will remain free from infection Outcome: Adequate for Discharge Goal: Diagnostic test results will improve Outcome: Adequate for Discharge Goal: Respiratory complications will improve Outcome: Adequate for Discharge Goal: Cardiovascular complication will be avoided Outcome: Adequate for Discharge   Problem: Activity: Goal: Risk for activity intolerance will decrease Outcome: Adequate for Discharge   Problem: Nutrition: Goal: Adequate nutrition will be maintained Outcome: Adequate for Discharge   Problem: Coping: Goal: Level of anxiety will decrease Outcome: Adequate for Discharge   Problem: Elimination: Goal: Will not experience complications related to bowel  motility Outcome: Adequate for Discharge Goal: Will not experience complications related to urinary retention Outcome: Adequate for Discharge   Problem: Pain Management: Goal: General experience of comfort will improve Outcome: Adequate for Discharge   Problem: Safety: Goal: Ability to remain free from injury will improve Outcome: Adequate for Discharge   Problem: Skin Integrity: Goal: Risk for impaired skin integrity will decrease Outcome: Adequate for Discharge

## 2023-09-21 NOTE — TOC Transition Note (Addendum)
Transition of Care [facility]) - Discharge Note   Patient Details  Name: Tyler [name] MRN: [id number] Date of Birth: Sep 27, 1945  Transition of Care [facility]) CM/SW Contact:  Bing Quarry, RN Phone Number: 09/21/2023, 11:28 AM   Clinical Narrative: 09/21/23: Patient has discharge orders in for today pending cardiology clearance for discharge. Admitted 09/11/23 via [facility] ED where present with SOB/CHF.   Patient seen earlier in the day by Heart Failure Clinic RN around 0930 am and was encouraged to go to ED if presenting symptoms in office worsened. Presented to [facility] ED around 4pm with triage notes at 433 pm noted.  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  On 09/13/23 patient s/p R LE angio with stent placement. 09/15/23 patient had procedure for amputation of Right hallux and LEFT foot wound debridement with graft.   Patient has declined HH and STR/SNF per prior CM notes. PLOF was independent with ambulation per PT evaluation notes, but does live on second flow with 13 steps with railing on left side. Pet PT notes patient did received Post-op shoe/boot and was attempted to given instructions on use. Declined any further DME recommended by PT/OT.    Patient noted to have 9 ED visits in the last 12 months at one facility. Was in rehab at [facility] in 2022 and was active with [facility] in 2019.   1 pm. UPDATE TO NOTE: Spoke with daughter regarding transportation. She lives 90 minutes away and stated she was told he would be discharged Sunday or Monday but will contact her husband who will contact patient regarding a pick up plan from the hospital. She also had concerns about dressing changes/needs and RN CM reviewed AVS follow up instructions with her as she will transport him to appointments. Updated provider and Unit RN.   Patient stated he will go home with  daughter for a few weeks prior to returning to home and work per prior CM notes. No SDOH needs noted on this admission assessment. Again, declined any DME needs. Stated he will have a friend transport to home as daughter currently at work, but "I will work it out". Updated provider and Unit RN.   Please contact TOC RN CM today at [phone] if further/additional needs arise prior to discharge. Thank you.   Gabriel Cirri MSN RN CM  Care Management Department.  [place]  [facility] Direct Dial: [phone] (Weekends Only) [facility] Main Office Phone: [phone] [facility] Fax: [phone]        Final next level of care: Home/Self Care Barriers to Discharge: Barriers Resolved   Patient Goals and CMS Choice            Discharge Placement                       Discharge Plan and Services Additional resources added to the After Visit Summary for       Post Acute Care Choice: NA          DME Arranged: NIV (Post op ortho shoe was provided on 09/20/23 per PT notes.)         HH Arranged: Refused SNF, Patient Refused HH HH Agency: NA        Social Drivers of Health (SDOH) Interventions SDOH Screenings   Food Insecurity: No Food Insecurity (09/13/2023)  Housing: Patient Declined (09/14/2023)  Transportation Needs: Patient Declined (  09/14/2023)  Utilities: Patient Declined (09/14/2023)  Alcohol Screen: Low Risk  (08/14/2023)  Financial Resource Strain: Low Risk  (03/20/2023)   Received from [facility]  Social Connections: Unknown (02/01/2022)   Received from [facility], [facility]  Stress: No Stress Concern Present (09/07/2020)   Received from [facility], [facility]  Tobacco Use: Low Risk  (09/11/2023)     Readmission Risk Interventions    09/12/2023   12:13 PM 08/15/2023    3:57 PM  Readmission Risk Prevention Plan  Transportation Screening Complete Complete  PCP or Specialist Appt within 3-5 Days Complete Complete  HRI or  Home Care Consult Complete Complete  Social Work Consult for Recovery Care Planning/Counseling Complete Complete  Palliative Care Screening Not Applicable Not Applicable  Medication Review Oceanographer) Complete Complete

## 2023-09-21 NOTE — Progress Notes (Signed)
SUBJECTIVE: Patient is feeling much better been walking around without any chest pain or shortness of breath.   Vitals:   09/21/23 0412 09/21/23 0500 09/21/23 0751 09/21/23 1220  BP: 121/74  118/66 (!) 118/57  Pulse: 68  71 76  Resp: 20  18 18   Temp: 98.4 F (36.9 C)  98.3 F (36.8 C)   TempSrc: Oral  Oral   SpO2: 100%  100% 98%  Weight:  106.5 kg    Height:        Intake/Output Summary (Last 24 hours) at 09/21/2023 1239 Last data filed at 09/21/2023 0900 Gross per 24 hour  Intake 480 ml  Output 1845 ml  Net -1365 ml    LABS: Basic Metabolic Panel: Recent Labs    09/20/23 0637 09/21/23 0814  NA 137 135  K 3.8 3.7  CL 102 98  CO2 26 25  GLUCOSE 134* 144*  BUN 52* 60*  CREATININE 1.19 1.37*  CALCIUM 9.1 9.0  MG  --  1.9   Liver Function Tests: No results for input(s): "AST", "ALT", "ALKPHOS", "BILITOT", "PROT", "ALBUMIN" in the last 72 hours. No results for input(s): "LIPASE", "AMYLASE" in the last 72 hours. CBC: Recent Labs    09/19/23 0419 09/20/23 0637  WBC 7.2 6.4  NEUTROABS 4.6 3.8  HGB 9.6* 9.7*  HCT 30.7* 30.8*  MCV 86.5 84.6  PLT 259 234   Cardiac Enzymes: No results for input(s): "CKTOTAL", "CKMB", "CKMBINDEX", "TROPONINI" in the last 72 hours. BNP: Invalid input(s): "POCBNP" D-Dimer: No results for input(s): "DDIMER" in the last 72 hours. Hemoglobin A1C: No results for input(s): "HGBA1C" in the last 72 hours. Fasting Lipid Panel: No results for input(s): "CHOL", "HDL", "LDLCALC", "TRIG", "CHOLHDL", "LDLDIRECT" in the last 72 hours. Thyroid Function Tests: No results for input(s): "TSH", "T4TOTAL", "T3FREE", "THYROIDAB" in the last 72 hours.  Invalid input(s): "FREET3" Anemia Panel: No results for input(s): "VITAMINB12", "FOLATE", "FERRITIN", "TIBC", "IRON", "RETICCTPCT" in the last 72 hours.   PHYSICAL EXAM General: Well developed, well nourished, in no acute distress HEENT:  Normocephalic and atramatic Neck:  No JVD.  Lungs: Clear  bilaterally to auscultation and percussion. Heart: HRRR . Normal S1 and S2 without gallops or murmurs.  Abdomen: Bowel sounds are positive, abdomen soft and non-tender  Msk:  Back normal, normal gait. Normal strength and tone for age. Extremities: No clubbing, cyanosis or edema.   Neuro: Alert and oriented X 3. Psych:  Good affect, responds appropriately  TELEMETRY: VVI paced rhythm  ASSESSMENT AND PLAN: HFrEF with history of persistent atrial fibrillation and status post pacemaker implantation presented with shortness of breath and is feeling much better with acute on chronic heart failure.  Patient can be discharged with follow-up in the office with Dr. Beryle Flock or in 1 to 2 weeks. c  Active Problems:   Acute on chronic congestive heart failure (HCC)   Persistent atrial fibrillation (HCC)   HTN (hypertension)   Chronic kidney disease, stage 3a (HCC)   Acute on chronic heart failure with reduced ejection fraction (HFrEF,30-35 %) (HCC)   Moderate mitral regurgitation by prior echocardiogram   Nonischemic cardiomyopathy (HCC)   Status cardiac pacemaker   Anemia due to chronic kidney disease   Uncontrolled type 2 diabetes mellitus with hyperglycemia, without long-term current use of insulin (HCC)   Chronic bilateral pleural effusions   Complete heart block (HCC)  s/p PACEMAKER   Infected plantar ulcers both feet (HCC)   Osteomyelitis of right foot (HCC)   CAP (community acquired  pneumonia)   Osteomyelitis of great toe of right foot (HCC)   Diabetic ulcer of left midfoot associated with type 2 diabetes mellitus, with fat layer exposed [facility])    Adrian Blackwater, MD, [facility] 09/21/2023 12:39 PM

## 2023-09-21 NOTE — Progress Notes (Signed)
When discharging this patient a live insect was found on this patient's bed. It was oval shaped and rust brown. The patient did not notice. After the patient was discharged a dead insect of the same description was found on the counter near the sink in this patient's room (233). Charge nurse notified. Specimen collected.

## 2023-09-21 NOTE — Discharge Summary (Signed)
Physician Discharge Summary  Tyler [name] OAC:[id number] DOB: 25-Aug-1946 DOA: 09/11/2023  PCP: Elder Negus, NP  Admit date: 09/11/2023 Discharge date: 09/21/2023  Admitted From: Home Disposition:  Home (SNF recommended, patient declined)  Recommendations for Outpatient Follow-up:  Follow up with PCP in 1-2 weeks Follow up with [facility] cardiology 1-2 weeks  Home Health:No(patient declined)  Equipment/Devices:None (patient declined)   Discharge Condition:Stable  CODE STATUS:FULL  Diet recommendation: Heart/carb  Brief/Interim Summary:  From H&P "Tyler [name] is a 77 y.o. 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  He presented to the hospital because of cough productive of yellowish phlegm, increasing shortness of breath, orthopnea, easy fatigability, about 10 pound weight gain in a week.  He was hospitalized on 08/13/2023 for CHF exacerbation.  His diuretic regimen had been adjusted in the outpatient setting but his symptoms did not improve. He also complained of oozing from the wounds on the bottom of his feet.   He was admitted to the hospital for acute exacerbation of chronic systolic CHF, community-acquired pneumonia, infected plantar ulcers on bilateral feet and right foot osteomyelitis.  Dr. Annamary Rummage, podiatrist, was consulted for right foot osteomyelitis.  He recommended MRI of the foot for further evaluation.  MRI of the left foot showed plantar surface ulcer without evidence of osteomyelitis or abscess.  MRI of the right foot showed septic arthritis of right hallux IPJ, and OM of the proximal and distal phalanx.  Podiatry was consulted who recommended  right hallux amputation versus ray resection and left foot ulcer debridement.  Patient was also seen by vascular surgery who performed right lower extremity arteriogram.  Patient underwent angioplasty and stent placement of right SFA and popliteal arteries 12/20.  "    Discharge Diagnoses:  Active Problems:   Acute on chronic heart failure with reduced ejection fraction (HFrEF,30-35 %) (HCC)   Moderate mitral regurgitation by prior echocardiogram   Nonischemic cardiomyopathy (HCC)   Chronic bilateral pleural effusions   CAP (community acquired pneumonia)   Persistent atrial fibrillation (HCC)   Infected plantar ulcers both feet (HCC)   Osteomyelitis of right foot (HCC)   HTN (hypertension)   Uncontrolled type 2 diabetes mellitus with hyperglycemia, without long-term current use of insulin (HCC)   Complete heart block (HCC)  s/p PACEMAKER   Chronic kidney disease, stage 3a (HCC)   Anemia due to chronic kidney disease   Acute on chronic congestive heart failure (HCC)   Status cardiac pacemaker   Osteomyelitis of great toe of right foot (HCC)   Diabetic ulcer of left midfoot associated with type 2 diabetes mellitus, with fat layer exposed (HCC)   Acute on chronic heart failure with reduced ejection fraction (HFrEF, 30-35 %) (HCC) Nonischemic cardiomyopathy Bilateral pleural effusions Moderate mitral regurgitation Patient failing outpatient management Cardiology consulted appreciate recommendations Last echo from October with EF 30 to 35% Plan: Discontinue IV Bumex.  Patient 20 L net negative.  Can resume home Bumex 2 mg daily.  Resume home metolazone.  Stable for discharge.  Follow-up outpatient cardiology.  GDMT restarted.  Hold Entresto as patient did not tolerate.   CAP (community acquired pneumonia) Patient with several week history of productive cough Patient with low-grade temp of 99 leukocytosis of 11,000 chest x-ray showing  right upper lobe opacity suspicious for  pneumonia Received Rocephin and azithromycin in the ED Has completed antibiotic course No further antibiotics     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  S/p R great toe amp and L foot wound I&D w/ wound graft with podiatry 12/22 Plan: Discharge home.  Weightbearing on right foot.  Partial minimal weightbearing on left with heel predominance.  2 weeks doxycycline on discharge.  Dressing to remain CDI.  Return to [place] office on 12/30 with Dr. Ralene Cork.    H/o Persistent atrial fibrillation (HCC) Continue metoprolol Patient off of Eliquis due to hematuria. (per review of heart failure clinic note from earlier in the day) Patient has been previously referred for ablation Continue aspirin   Complete heart block (HCC)   s/p PACEMAKER In 2021. No acute issues   Uncontrolled type 2 diabetes mellitus with hyperglycemia, without long-term current use of insulin (HCC) A1c 7.2 2 months ago.  Continue insulin therapy   HTN (hypertension) BP controlled Continue metoprolol   Chronic kidney disease, stage 3a (HCC) Renal function at baseline   Anemia due to chronic kidney disease Hemoglobin at baseline   Discharge Instructions  Discharge Instructions     Diet - low sodium heart healthy   Complete by: As directed    Increase activity slowly   Complete by: As directed    No wound care   Complete by: As directed    No wound care   Complete by: As directed       Allergies as of 09/21/2023       Reactions   Eliquis [apixaban]    Dizziness  and vision change   Spironolactone Other (See Comments)   dizziness        Medication List     STOP taking these medications    OVER THE COUNTER MEDICATION   sacubitril-valsartan 24-26 MG Commonly known as: ENTRESTO       TAKE these medications    acetaminophen 500 MG  tablet Commonly known as: TYLENOL Take 2 tablets (1,000 mg total) by mouth 3 (three) times daily as needed (for pain).   aspirin EC 81 MG tablet Take 1 tablet (81 mg total) by mouth daily. Swallow whole.   atorvastatin 10 MG tablet Commonly known as: LIPITOR Take 1 tablet (10 mg total) by mouth daily at 6 PM.   bumetanide 2 MG tablet Commonly known as: Bumex Take 1 tablet (2 mg total) by mouth daily.   clopidogrel 75 MG tablet Commonly known as: PLAVIX Take 1 tablet (75 mg total) by mouth daily with breakfast. Start taking on: September 22, 2023   doxycycline 100 MG tablet Commonly known as: VIBRA-TABS Take 1 tablet (100 mg total) by mouth every 12 (twelve) hours for 7 days.   gentamicin cream 0.1 % Commonly known as: GARAMYCIN Apply 1 Application topically 2 (two) times daily.   glipiZIDE 10 MG tablet Commonly known as: GLUCOTROL Take 1 tablet (10 mg total) by mouth 2 (two) times daily.   Kerendia 10 MG Tabs Generic drug: Finerenone Take 1 tablet (10 mg total) by mouth daily.   losartan 25 MG tablet Commonly known as: COZAAR Take 1 tablet (25 mg total) by mouth daily. Start taking on: September 22, 2023   metFORMIN 1000 MG tablet Commonly known as: GLUCOPHAGE Take 1 tablet (1,000 mg total) by mouth 2 (two) times  daily.   metolazone 2.5 MG tablet Commonly known as: ZAROXOLYN Take 1 tablet (2.5 mg total) by mouth 2 (two) times a week. On Mondays & Fridays   metoprolol succinate 50 MG 24 hr tablet Commonly known as: TOPROL-XL Take 1 tablet (50 mg total) by mouth daily. Take with or immediately following a meal.   multivitamin with minerals tablet Take 1 tablet by mouth daily.   silver sulfADIAZINE 1 % cream Commonly known as: SILVADENE Apply to affected area daily        Follow-up Information     Alluri, Meryl Dare, MD. Go in 1 week(s).   Specialty: Cardiology Why: Appointment scheduled 1/3 at 9 AM Contact information: [address] [place]  [place] [address] [phone]         Georgiana Spinner, NP Follow up in 1 month(s).   Specialty: Vascular Surgery Why: Bilateral lower extremity Ultrasounds with ABI's Contact information: [address] [place] [address] [phone]         Louann Sjogren, [name]. Go on 09/23/2023.   Specialty: Podiatry Contact information: [address] [place] [address] [phone]                Allergies  Allergen Reactions   Eliquis [Apixaban]     Dizziness  and vision change   Spironolactone Other (See Comments)    dizziness    Consultations: Cardiology Podiatry   Procedures/Studies: DG Foot 2 Views Right Result Date: 09/15/2023 CLINICAL DATA:  Right foot osteomyelitis. EXAM: RIGHT FOOT - 2 VIEW COMPARISON:  MRI 09/12/2023.  X-ray 09/11/2023 FINDINGS: Interval great toe amputation at the level of the MTP joint. Stable appearance third toe amputation at the mid metatarsal level with evidence of prior resection of the fourth metatarsal head. IMPRESSION: Interval great toe amputation at the level of the MTP joint. Electronically Signed   By: Kennith Center M.D.   On: 09/15/2023 14:04   PERIPHERAL VASCULAR CATHETERIZATION Result Date: 09/13/2023 See surgical note for result.  MR FOOT LEFT WO CONTRAST Result Date: 09/12/2023 CLINICAL DATA:  Plantar foot ulcer. EXAM: MRI OF THE LEFT FOOT WITHOUT CONTRAST TECHNIQUE: Multiplanar, multisequence MR imaging of the left forefoot was performed. No intravenous contrast was administered. COMPARISON:  Left foot x-rays from yesterday. CT left foot dated July 06, 2023. FINDINGS: Bones/Joint/Cartilage No suspicious marrow signal abnormality. Subtle periarticular marrow edema along the plantar aspect of the first TMT joint is also seen at the dorsal aspect of the joint and is most likely degenerative. 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 No fracture or dislocation.  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 Mild midfoot degenerative changes. No joint effusion. Ligaments Collateral ligaments are intact. Muscles and Tendons No tenosynovitis. Complete fatty atrophy of the intrinsic foot muscles. Soft tissue Ulceration along the medial plantar aspect of the midfoot near the base of the medial cuneiform. Diffuse soft tissue swelling. No fluid collection. No soft tissue mass. IMPRESSION: 1. Skin ulceration along the medial plantar aspect of the midfoot near the base of the medial cuneiform. No evidence of osteomyelitis or abscess. Electronically Signed   By: [name] M.D.   On: 09/12/2023 18:01   MR FOOT RIGHT WO CONTRAST Result Date: 09/12/2023 CLINICAL DATA:  Right foot ulcer. EXAM: MRI OF THE RIGHT FOREFOOT WITHOUT CONTRAST TECHNIQUE: Multiplanar, multisequence MR imaging of the right forefoot was performed. No intravenous contrast was administered. COMPARISON:  Right foot x-rays from yesterday. CT right foot dated July 06, 2023. FINDINGS: Bones/Joint/Cartilage Abnormal marrow edema involving the first proximal phalanx head and majority of the first distal phalanx. First IP joint effusion. Prior third ray distal fourth metatarsal amputations. No fracture or dislocation. Unchanged severe first MTP joint osteoarthritis. No joint effusion. Ligaments Collateral ligaments are intact. Muscles and Tendons No tenosynovitis. Near complete fatty atrophy of the intrinsic muscles. Soft tissue Deep ulceration along the medial plantar aspect of the great toe with sinus tract extending to the IP joint. Diffuse soft tissue swelling without focal fluid collection. Small ganglion cyst along the plantar aspect of the second metatarsal head. IMPRESSION: 1. Deep ulceration along the medial plantar aspect of the great toe with sinus tract extending to the IP joint. Associated septic arthritis and osteomyelitis of  the first proximal and distal phalanges. No abscess. Electronically Signed   By: [name] M.D.   On: 09/12/2023 17:56   DG Foot 2 Views Right Result Date: 09/11/2023 CLINICAL DATA:  Diabetic foot ulcer EXAM: RIGHT FOOT - 2 VIEW COMPARISON:  None Available. FINDINGS: Transmetatarsal amputation of the right third toe and resection of the right fourth metatarsal head. Large ulcer is seen within the medial and plantar right great toe which extends to the cortex of the base of the distal phalanx with osseous erosion in this location in keeping with changes of osteomyelitis. There is extensive superimposed soft tissue swelling right great toe and second toe as well as moderate soft tissue swelling of the right forefoot diffusely. Vascular calcifications are noted. Advanced degenerative arthritis noted of the first metatarsophalangeal joint. No acute fracture or dislocation. IMPRESSION: 1. Large ulcer within the medial and plantar right great toe which extends to the cortex of the base of the distal phalanx with osseous erosion in this location in keeping with changes of osteomyelitis. 2. Transmetatarsal amputation of the right third toe and resection of the right fourth metatarsal head. Electronically Signed   By: Helyn Numbers M.D.   On: 09/11/2023 22:48   DG Foot 2 Views Left Result Date: 09/11/2023 CLINICAL DATA:  Diabetic ulcer EXAM: LEFT FOOT - 2 VIEW COMPARISON:  None Available. FINDINGS: No acute fracture or dislocation. 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 There is a soft tissue wound within the soft tissues distal to the residual first metatarsal. No definite superimposed osseous erosion. There is diffuse soft tissue swelling of the second and third digits. Posttraumatic changes noted involving the second metatarsal head. Moderate midfoot and tibiotalar degenerative arthritis. Vascular calcifications are noted. IMPRESSION: 1. Remote transmetatarsal amputation of the left great  toe. Soft tissue wound within the soft tissues distal to the residual first metatarsal. No definite superimposed osseous erosion. 2. Diffuse soft tissue swelling of the second and third digits. Electronically Signed   By: Helyn Numbers M.D.   On: 09/11/2023 22:46   DG Chest 2 View Result Date: 09/11/2023 CLINICAL DATA:  Shortness of breath EXAM: CHEST - 2 VIEW COMPARISON:  08/13/2023 FINDINGS: Small bilateral pleural effusions. No frank interstitial edema. Mild vague right upper lobe opacity, suspicious for pneumonia. No pleural effusion or pneumothorax. Cardiomegaly.  Left subclavian pacemaker. Degenerative changes of the visualized thoracolumbar spine. IMPRESSION: Mild vague right upper lobe opacity, suspicious for pneumonia. Small bilateral pleural effusions. No frank interstitial edema. Electronically Signed   By: Charline Bills M.D.   On: 09/11/2023 18:09      Subjective: Seen and examined on the day of discharge.  Stable  no distress.  Appropriate for discharge home.  Discharge Exam: Vitals:   09/21/23 0751 09/21/23 1220  BP: 118/66 (!) 118/57  Pulse: 71 76  Resp: 18 18  Temp: 98.3 F (36.8 C)   SpO2: 100% 98%   Vitals:   09/21/23 0412 09/21/23 0500 09/21/23 0751 09/21/23 1220  BP: 121/74  118/66 (!) 118/57  Pulse: 68  71 76  Resp: 20  18 18   Temp: 98.4 F (36.9 C)  98.3 F (36.8 C)   TempSrc: Oral  Oral   SpO2: 100%  100% 98%  Weight:  106.5 kg    Height:        General: Pt is alert, awake, not in acute distress Cardiovascular: RRR, S1/S2 +, no rubs, no gallops Respiratory: CTA bilaterally, no wheezing, no rhonchi Abdominal: Soft, NT, ND, bowel sounds + Extremities: no edema, no cyanosis    The results of significant diagnostics from this hospitalization (including imaging, microbiology, ancillary and laboratory) are listed below for reference.     Microbiology: Recent Results (from the past 240 hours)  Resp panel by RT-PCR (RSV, Flu A&B, Covid) Anterior  Nasal Swab     Status: None   Collection Time: 09/12/23  3:13 AM   Specimen: Anterior Nasal Swab  Result Value Ref Range Status   SARS Coronavirus 2 by RT PCR NEGATIVE NEGATIVE Final    Comment: (NOTE) SARS-CoV-2 target nucleic acids are NOT DETECTED.  The SARS-CoV-2 RNA is generally detectable in upper respiratory specimens during the acute phase of infection. The lowest concentration of SARS-CoV-2 viral copies this assay can detect is 138 copies/mL. A negative result does not preclude SARS-Cov-2 infection and should not be used as the sole basis for treatment or other patient management decisions. A negative result may occur with  improper specimen collection/handling, submission of specimen other than nasopharyngeal swab, presence of viral mutation(s) within the areas targeted by this assay, and inadequate number of viral copies(<138 copies/mL). A negative result must be combined with clinical observations, patient history, and epidemiological information. The expected result is Negative.  Fact Sheet for Patients:  BloggerCourse.com  Fact Sheet for Healthcare Providers:  SeriousBroker.it  This test is no t yet approved or cleared by the [place] FDA and  has been authorized for detection and/or diagnosis of SARS-CoV-2 by FDA under an Emergency Use Authorization (EUA). This EUA will remain  in effect (meaning this test can be used) for the duration of the COVID-19 declaration under Section 564(b)(1) of the Act, 21 U.S.C.section 360bbb-3(b)(1), unless the authorization is terminated  or revoked sooner.       Influenza A by PCR NEGATIVE NEGATIVE Final   Influenza B by PCR NEGATIVE NEGATIVE Final    Comment: (NOTE) The Xpert Xpress SARS-CoV-2/FLU/RSV plus assay is intended as an aid in the diagnosis of influenza from Nasopharyngeal swab specimens and should not be used as a sole basis for treatment. Nasal washings  and aspirates are unacceptable for Xpert Xpress SARS-CoV-2/FLU/RSV testing.  Fact Sheet for Patients: BloggerCourse.com  Fact Sheet for Healthcare Providers: SeriousBroker.it  This test is not yet approved or cleared by the [place] FDA and has been authorized for detection and/or diagnosis of SARS-CoV-2 by FDA under an Emergency Use Authorization (EUA). This EUA will remain in effect (meaning this test can be used) for the duration of the COVID-19 declaration under Section 564(b)(1) of the Act, 21 U.S.C. section 360bbb-3(b)(1), unless the authorization is terminated or revoked.     Resp Syncytial Virus by  PCR NEGATIVE NEGATIVE Final    Comment: (NOTE) Fact Sheet for Patients: BloggerCourse.com  Fact Sheet for Healthcare Providers: SeriousBroker.it  This test is not yet approved or cleared by the [place] FDA and has been authorized for detection and/or diagnosis of SARS-CoV-2 by FDA under an Emergency Use Authorization (EUA). This EUA will remain in effect (meaning this test can be used) for the duration of the COVID-19 declaration under Section 564(b)(1) of the Act, 21 U.S.C. section 360bbb-3(b)(1), unless the authorization is terminated or revoked.  Performed at [facility], [address] [place], [place] [address]   Aerobic Culture w Gram Stain (superficial specimen)     Status: None   Collection Time: 09/15/23  9:27 AM   Specimen: Wound  Result Value Ref Range Status   Specimen Description   Final    WOUND Performed at [facility], [address] [place], [place] [address]    Special Requests RIGHT GREAT toe  Final   Gram Stain RARE WBC SEEN RARE GRAM POSITIVE COCCI   Final   Culture   Final    FEW METHICILLIN RESISTANT STAPHYLOCOCCUS AUREUS WITHIN MIXED SKIN FLORA Performed at [facility], [address],  [place], [place] [address]    Report Status 09/18/2023 FINAL  Final   Organism ID, Bacteria METHICILLIN RESISTANT STAPHYLOCOCCUS AUREUS  Final      Susceptibility   Methicillin resistant staphylococcus aureus - MIC*    CIPROFLOXACIN >=8 RESISTANT Resistant     ERYTHROMYCIN >=8 RESISTANT Resistant     GENTAMICIN <=0.5 SENSITIVE Sensitive     OXACILLIN >=4 RESISTANT Resistant     TETRACYCLINE <=1 SENSITIVE Sensitive     VANCOMYCIN <=0.5 SENSITIVE Sensitive     TRIMETH/SULFA <=10 SENSITIVE Sensitive     CLINDAMYCIN <=0.25 SENSITIVE Sensitive     RIFAMPIN <=0.5 SENSITIVE Sensitive     Inducible Clindamycin NEGATIVE Sensitive     LINEZOLID 2 SENSITIVE Sensitive     * FEW METHICILLIN RESISTANT STAPHYLOCOCCUS AUREUS  Aerobic Culture w Gram Stain (superficial specimen)     Status: None   Collection Time: 09/15/23  9:28 AM   Specimen: Wound  Result Value Ref Range Status   Specimen Description BONE  Final   Special Requests right great toe  Final   Gram Stain NO WBC SEEN NO ORGANISMS SEEN   Final   Culture   Final    NO GROWTH 2 DAYS Performed at [facility], [address] [place], [place] [address]    Report Status 09/17/2023 FINAL  Final     Labs: BNP (last 3 results) Recent Labs    09/06/23 1130 09/11/23 1105 09/11/23 1627  BNP 1,739.5* 3,491.5* 3,874.2*   Basic Metabolic Panel: Recent Labs  Lab 09/15/23 0235 09/16/23 0420 09/17/23 0310 09/18/23 0628 09/19/23 0419 09/20/23 0637 09/21/23 0814  NA 134* 138 135 137 137 137 135  K 3.4* 3.7 3.4* 3.6 3.7 3.8 3.7  CL 102 104 104 102 100 102 98  CO2 23 23 23 25 25 26 25   GLUCOSE 214* 160* 141* 166* 153* 134* 144*  BUN 55* 51* 49* 50* 50* 52* 60*  CREATININE 1.32* 1.37* 1.13 1.16 1.25* 1.19 1.37*  CALCIUM 8.6* 8.8* 8.6* 8.9 9.2 9.1 9.0  MG 2.2 2.0  --   --   --   --  1.9   Liver Function Tests: No results for input(s): "AST", "ALT", "ALKPHOS", "BILITOT", "PROT", "ALBUMIN" in the last 168 hours. No results  for  input(s): "LIPASE", "AMYLASE" in the last 168 hours. No results for input(s): "AMMONIA" in the last 168 hours. CBC: Recent Labs  Lab 09/16/23 0420 09/17/23 0310 09/18/23 0628 09/19/23 0419 09/20/23 0637  WBC 8.1 7.4 6.8 7.2 6.4  NEUTROABS  --   --  4.4 4.6 3.8  HGB 10.1* 9.1* 9.6* 9.6* 9.7*  HCT 33.1* 28.6* 29.8* 30.7* 30.8*  MCV 87.3 84.1 83.7 86.5 84.6  PLT 252 231 219 259 234   Cardiac Enzymes: No results for input(s): "CKTOTAL", "CKMB", "CKMBINDEX", "TROPONINI" in the last 168 hours. BNP: Invalid input(s): "POCBNP" CBG: Recent Labs  Lab 09/20/23 0738 09/20/23 1209 09/20/23 1715 09/21/23 0753 09/21/23 1152  GLUCAP 142* 193* 143* 142* 220*   D-Dimer No results for input(s): "DDIMER" in the last 72 hours. Hgb A1c No results for input(s): "HGBA1C" in the last 72 hours. Lipid Profile No results for input(s): "CHOL", "HDL", "LDLCALC", "TRIG", "CHOLHDL", "LDLDIRECT" in the last 72 hours. Thyroid function studies No results for input(s): "TSH", "T4TOTAL", "T3FREE", "THYROIDAB" in the last 72 hours.  Invalid input(s): "FREET3" Anemia work up No results for input(s): "VITAMINB12", "FOLATE", "FERRITIN", "TIBC", "IRON", "RETICCTPCT" in the last 72 hours. Urinalysis    Component Value Date/Time   COLORURINE YELLOW (A) 07/04/2023 0640   APPEARANCEUR CLEAR (A) 07/04/2023 0640   APPEARANCEUR Clear 07/05/2014 2040   LABSPEC 1.015 07/04/2023 0640   LABSPEC 1.030 07/05/2014 2040   PHURINE 6.0 07/04/2023 0640   GLUCOSEU NEGATIVE 07/04/2023 0640   GLUCOSEU >=500 07/05/2014 2040   HGBUR NEGATIVE 07/04/2023 0640   BILIRUBINUR NEGATIVE 07/04/2023 0640   BILIRUBINUR Negative 07/05/2014 2040   KETONESUR NEGATIVE 07/04/2023 0640   PROTEINUR NEGATIVE 07/04/2023 0640   NITRITE NEGATIVE 07/04/2023 0640   LEUKOCYTESUR TRACE (A) 07/04/2023 0640   LEUKOCYTESUR Negative 07/05/2014 2040   Sepsis Labs Recent Labs  Lab 09/17/23 0310 09/18/23 0628 09/19/23 0419 09/20/23 0637  WBC  7.4 6.8 7.2 6.4   Microbiology Recent Results (from the past 240 hours)  Resp panel by RT-PCR (RSV, Flu A&B, Covid) Anterior Nasal Swab     Status: None   Collection Time: 09/12/23  3:13 AM   Specimen: Anterior Nasal Swab  Result Value Ref Range Status   SARS Coronavirus 2 by RT PCR NEGATIVE NEGATIVE Final    Comment: (NOTE) SARS-CoV-2 target nucleic acids are NOT DETECTED.  The SARS-CoV-2 RNA is generally detectable in upper respiratory specimens during the acute phase of infection. The lowest concentration of SARS-CoV-2 viral copies this assay can detect is 138 copies/mL. A negative result does not preclude SARS-Cov-2 infection and should not be used as the sole basis for treatment or other patient management decisions. A negative result may occur with  improper specimen collection/handling, submission of specimen other than nasopharyngeal swab, presence of viral mutation(s) within the areas targeted by this assay, and inadequate number of viral copies(<138 copies/mL). A negative result must be combined with clinical observations, patient history, and epidemiological information. The expected result is Negative.  Fact Sheet for Patients:  BloggerCourse.com  Fact Sheet for Healthcare Providers:  SeriousBroker[employer]it  This test is no t yet approved or cleared by the [place] FDA and  has been authorized for detection and/or diagnosis of SARS-CoV-2 by FDA under an Emergency Use Authorization (EUA). This EUA will remain  in effect (meaning this test can be used) for the duration of the COVID-19 declaration under Section 564(b)(1) of the Act, 21 U.S.C.section 360bbb-3(b)(1), unless the authorization is terminated  or revoked sooner.  Influenza A by PCR NEGATIVE NEGATIVE Final   Influenza B by PCR NEGATIVE NEGATIVE Final    Comment: (NOTE) The Xpert Xpress SARS-CoV-2/FLU/RSV plus assay is intended as an aid in the  diagnosis of influenza from Nasopharyngeal swab specimens and should not be used as a sole basis for treatment. Nasal washings and aspirates are unacceptable for Xpert Xpress SARS-CoV-2/FLU/RSV testing.  Fact Sheet for Patients: BloggerCourse.com  Fact Sheet for Healthcare Providers: SeriousBroker.it  This test is not yet approved or cleared by the [place] FDA and has been authorized for detection and/or diagnosis of SARS-CoV-2 by FDA under an Emergency Use Authorization (EUA). This EUA will remain in effect (meaning this test can be used) for the duration of the COVID-19 declaration under Section 564(b)(1) of the Act, 21 U.S.C. section 360bbb-3(b)(1), unless the authorization is terminated or revoked.     Resp Syncytial Virus by PCR NEGATIVE NEGATIVE Final    Comment: (NOTE) Fact Sheet for Patients: BloggerCourse.com  Fact Sheet for Healthcare Providers: SeriousBroker.it  This test is not yet approved or cleared by the [place] FDA and has been authorized for detection and/or diagnosis of SARS-CoV-2 by FDA under an Emergency Use Authorization (EUA). This EUA will remain in effect (meaning this test can be used) for the duration of the COVID-19 declaration under Section 564(b)(1) of the Act, 21 U.S.C. section 360bbb-3(b)(1), unless the authorization is terminated or revoked.  Performed at [facility], [address], [place], [place] [address]   Aerobic Culture w Gram Stain (superficial specimen)     Status: None   Collection Time: 09/15/23  9:27 AM   Specimen: Wound  Result Value Ref Range Status   Specimen Description   Final    WOUND Performed at [facility], [address] [place], [place] [address]    Special Requests RIGHT GREAT toe  Final   Gram Stain RARE WBC SEEN RARE GRAM POSITIVE COCCI   Final   Culture   Final    FEW  METHICILLIN RESISTANT STAPHYLOCOCCUS AUREUS WITHIN MIXED SKIN FLORA Performed at [facility], [address] [place], [place] [address]    Report Status 09/18/2023 FINAL  Final   Organism ID, Bacteria METHICILLIN RESISTANT STAPHYLOCOCCUS AUREUS  Final      Susceptibility   Methicillin resistant staphylococcus aureus - MIC*    CIPROFLOXACIN >=8 RESISTANT Resistant     ERYTHROMYCIN >=8 RESISTANT Resistant     GENTAMICIN <=0.5 SENSITIVE Sensitive     OXACILLIN >=4 RESISTANT Resistant     TETRACYCLINE <=1 SENSITIVE Sensitive     VANCOMYCIN <=0.5 SENSITIVE Sensitive     TRIMETH/SULFA <=10 SENSITIVE Sensitive     CLINDAMYCIN <=0.25 SENSITIVE Sensitive     RIFAMPIN <=0.5 SENSITIVE Sensitive     Inducible Clindamycin NEGATIVE Sensitive     LINEZOLID 2 SENSITIVE Sensitive     * FEW METHICILLIN RESISTANT STAPHYLOCOCCUS AUREUS  Aerobic Culture w Gram Stain (superficial specimen)     Status: None   Collection Time: 09/15/23  9:28 AM   Specimen: Wound  Result Value Ref Range Status   Specimen Description BONE  Final   Special Requests right great toe  Final   Gram Stain NO WBC SEEN NO ORGANISMS SEEN   Final   Culture   Final    NO GROWTH 2 DAYS Performed at [facility], [address], [place], [place] [address]    Report Status 09/17/2023 FINAL  Final     Time coordinating  discharge: Over 30 minutes  SIGNED:   Tresa Moore, MD  [facility] 09/21/2023, 12:39 PM Pager   If 7PM-7AM, please contact night-coverage

## 2023-09-24 ENCOUNTER — Encounter: Payer: Medicare Other | Admitting: Podiatry

## 2023-09-27 ENCOUNTER — Ambulatory Visit (INDEPENDENT_AMBULATORY_CARE_PROVIDER_SITE_OTHER): Payer: BC Managed Care – PPO

## 2023-09-27 ENCOUNTER — Encounter: Payer: BC Managed Care – PPO | Admitting: Family

## 2023-09-27 ENCOUNTER — Ambulatory Visit (INDEPENDENT_AMBULATORY_CARE_PROVIDER_SITE_OTHER): Payer: BC Managed Care – PPO | Admitting: Podiatry

## 2023-09-27 ENCOUNTER — Other Ambulatory Visit: Payer: Self-pay | Admitting: Cardiology

## 2023-09-27 ENCOUNTER — Encounter: Payer: Self-pay | Admitting: Podiatry

## 2023-09-27 DIAGNOSIS — I5020 Unspecified systolic (congestive) heart failure: Secondary | ICD-10-CM | POA: Diagnosis not present

## 2023-09-27 DIAGNOSIS — E0843 Diabetes mellitus due to underlying condition with diabetic autonomic (poly)neuropathy: Secondary | ICD-10-CM

## 2023-09-27 DIAGNOSIS — I480 Paroxysmal atrial fibrillation: Secondary | ICD-10-CM | POA: Diagnosis not present

## 2023-09-27 DIAGNOSIS — I739 Peripheral vascular disease, unspecified: Secondary | ICD-10-CM | POA: Diagnosis not present

## 2023-09-27 DIAGNOSIS — S98111A Complete traumatic amputation of right great toe, initial encounter: Secondary | ICD-10-CM | POA: Diagnosis not present

## 2023-09-27 DIAGNOSIS — I5022 Chronic systolic (congestive) heart failure: Secondary | ICD-10-CM

## 2023-09-27 DIAGNOSIS — Z9889 Other specified postprocedural states: Secondary | ICD-10-CM

## 2023-09-27 DIAGNOSIS — L97522 Non-pressure chronic ulcer of other part of left foot with fat layer exposed: Secondary | ICD-10-CM

## 2023-09-27 DIAGNOSIS — I10 Essential (primary) hypertension: Secondary | ICD-10-CM | POA: Diagnosis not present

## 2023-09-27 MED ORDER — DOXYCYCLINE HYCLATE 100 MG PO TABS: 100.0000 mg | ORAL_TABLET | Freq: Two times a day (BID) | ORAL | 0 refills | Status: AC

## 2023-09-27 NOTE — Progress Notes (Signed)
 No chief complaint on file.   Subjective:  Patient presents today status post RT great toe amputation with left foot wound debridement and application of wound graft (Acell Micromatrix powder w/ wound matrix)  performed inpatient at [facility].  DOS: 09/15/2023.  Subsequently discharged 09/21/2023.  Patient doing well.  WBAT surgical shoe.  Past Medical History:  Diagnosis Date   (HFpEF) heart failure with preserved ejection fraction (HCC) 03/01/2020   a.) TTE 03/01/2020: EF 55-60%, mod MAC, mod AoV sclerosis, triv AR, mild TR, mod MR, RVSP 50-59; b.) TTE 09/10/2020: EF 55-60%, mod MAC, mod AoV sclerosis, mild TR, 3+ MR, RVSP 50-59; c.) TTE 09/26/2020: EF 55-60%, mild LA dil, triv PR, mild MR/TR, RVSP 37-49   Adenoma of left adrenal gland    Anemia    Arthritis    Atrial fibrillation and flutter (HCC)    a.) CHA2DS2VASc = 5 (age x2, HFpEF, HTN, T2DM);  b.) s/p CTI ablation 09/07/2020; c.) rate/rhythm maintained on oral carvedilol ; not on chronic anticoagulation therapy   CAD (coronary artery disease)    Cardiomegaly    CKD (chronic kidney disease), stage III (HCC)    DOE (dyspnea on exertion)    Drug-induced bradycardia    Gangrene of toe of left foot (HCC)    a.) 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    HLD (hyperlipidemia)    Hypertension    Long term current use of aspirin     Lymphedema of both lower extremities    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)    a.) s/p amputation 11/04/2022   Peripheral vascular disease (HCC)    Pleural effusion on right 09/09/2020   a.) s/p RIGHT thoracentesis with 2180 cc yield   Pneumonia    Presence of permanent cardiac pacemaker 09/10/2020   a.) TVP placement 09/10/2020 due to intermittent CHB in setting of urosepsis; b.) s/p PPM placement 09/15/2020: MDT Azure XT DR (SN: [id number])   Pulmonary hypertension (HCC) 03/01/2020   a.) TTE: 03/01/2020: RVSP 50-59;  b.) TTE 09/10/2020: RVSP 50-59; c.) TTE 09/26/2020: RVSP 37-49   RA (rheumatoid arthritis) (HCC)    Rheumatic fever    Sepsis (HCC) 09/10/2020   a.) urosepsis --> BC x 2 sets and UC all grew out significant Proteus mirabilis; admitted to [facility] 09/07/2020 - 09/27/2020.   Sick sinus syndrome [facility])    a.) s/p MDT PPM placement 09/15/2020   T2DM (type 2 diabetes mellitus) (HCC)    Urinary retention    chronic, with indwelling Foley catheter and plans for a suprapubic   Wears dentures    full upper    Past Surgical History:  Procedure Laterality Date   AMPUTATION TOE Right 11/04/2022   Procedure: AMPUTATION TOE;  Surgeon: Janit Thresa HERO, [name];  [name];  Service: Podiatry;  Laterality: Right;   AMPUTATION TOE Right 09/15/2023   Procedure: AMPUTATION TOE;  Surgeon: Joya Stabs, [name];  [name];  Service: Orthopedics/Podiatry;  Laterality: Right;  Right hallux amputation   BONE BIOPSY Right 04/05/2023   Procedure: BONE BIOPSY THIRD & FOURTH;  Surgeon: Janit Thresa HERO, [name];  [name];  Service: Podiatry;  Laterality: Right;   CARDIAC ELECTROPHYSIOLOGY STUDY AND ABLATION N/A 09/07/2020   Procedure: CARDIAC EP STUDY AND ABLATION (CTI)   CATARACT EXTRACTION W/PHACO Right 01/16/2017   Procedure: CATARACT EXTRACTION PHACO AND INTRAOCULAR LENS PLACEMENT (IOC)  Right Complicated;  Surgeon: Dene  Brasington, MD;  Location: [facility];  Service: Ophthalmology;  Laterality: Right;  IVA Block Healon 5 malyugin vision blue Diabetic - oral meds   CATARACT EXTRACTION W/PHACO Left 10/23/2022   Procedure: CATARACT EXTRACTION PHACO AND INTRAOCULAR LENS PLACEMENT (IOC) LEFT DIABETIC;  Surgeon: Jaye Fallow, MD;  Location: [facility];  Service: Ophthalmology;  Laterality: Left;  Diabetic   COLONOSCOPY     INCISION AND DRAINAGE OF WOUND Left 09/15/2023   Procedure: IRRIGATION AND DEBRIDEMENT WOUND;  Surgeon: Joya Stabs, [name];  Location:  [facility];  Service: Orthopedics/Podiatry;  Laterality: Left;  Left foot wound debridement, possible graft/biopsy   LOWER EXTREMITY ANGIOGRAPHY Right 11/02/2022   Procedure: Lower Extremity Angiography;  Surgeon: Marea Selinda RAMAN, MD;  Location: [facility];  Service: Cardiovascular;  Laterality: Right;   LOWER EXTREMITY ANGIOGRAPHY Right 09/13/2023   Procedure: Lower Extremity Angiography;  Surgeon: Jama Cordella MATSU, MD;  Location: [facility];  Service: Cardiovascular;  Laterality: Right;   METATARSAL HEAD EXCISION Left 07/05/2018   Procedure: RESECTION FIRST METATARSAL INFECTED BONE AND SOFT TISSUE;  Surgeon: Lilli Cough, [name];  [name];  Service: Podiatry;  Laterality: Left;   METATARSAL HEAD EXCISION Right 04/05/2023   Procedure: METATARSAL HEAD EXCISION THIRD & FOURTH;  Surgeon: Janit Thresa HERO, [name];  [name];  Service: Podiatry;  Laterality: Right;   PACEMAKER INSERTION  09/15/2020   TOE AMPUTATION Left    TONSILLECTOMY      Allergies  Allergen Reactions   Eliquis  [Apixaban ]     Dizziness  and vision change   Spironolactone  Other (See Comments)    dizziness    Objective/Physical Exam Neurovascular status intact.  Skin is warm to touch.  Incisions intact.  Moderate edema with mild localized erythema.  Scant serous drainage  Ulcer noted plantar aspect of the left foot measuring approximately 2.0 x 2.0 x 0.2 cm.  Granular wound base.  There is some scant periwound maceration.  No malodor.  LOWER EXTREMITY ANGIOGRAPHY 09/13/2023 Findings:  The right common femoral is widely patent as is the profunda femoris.  The SFA has a pre-existing stent which is patent.  There is moderate 40 to 50% stenosis at the leading edge of the existing stent proximally.  Distally there is a greater than 70% stenosis at the distal end of the stent and then there are diffuse lesions some of which are 80% particularly the 1 at the level of the femoral condyles in the mid  popliteal.  The distal popliteal demonstrates disease but no hemodynamically significant stenosis and the trifurcation is heavily diseased with occlusion of the tibioperoneal trunk, peroneal and posterior tibial which demonstrates occlusion throughout its course.  The anterior tibial is patent from its origin down to the foot which fills the dorsalis pedis and the pedal arch.   Following angioplasty the SFA and proximal popliteal demonstrate significant residual stenosis as described above.  Therefore a life stent is placed and postdilated with a 6 mm Lutonix balloon.  There is 1 focal area in the mid popliteal as described above which had significant residual stenosis in this area was treated with a 7 mm balloon inflation.  Following this there is wide patency of the entire SFA and popliteal with less than 10% residual stenosis throughout.  The anterior tibial is the single-vessel runoff is preserved all the way down to the foot..  Radiographic Exam RT foot 09/27/2023:  Unchanged from prior x-rays 09/15/2023.  Interval great toe amputation at the level  of the MTP.  Prior third toe amputation at the mid metatarsal level with interval resection of the fourth metatarsal head as well.  No acute fracture.  No acute erosions concerning for residual osteomyelitis.  Assessment: 1. s/p right great toe amputation. LT foot wound debridement w/ application of Acell. DOS: 09/15/2023 inpatient at [facility]. Dr. Sikora 2.  Peripheral vascular disease 3.  Ulcer left foot secondary to diabetes mellitus   Plan of Care:  -Patient was evaluated. X-rays reviewed -Medically necessary excisional debridement including subcutaneous tissue was performed today to the left foot wound.  Excisional debridement of the necrotic nonviable tissue down to healthier bleeding viable tissue was performed with postdebridement measurement same as pre- - Dressings changed.  Leave clean dry and intact x 1 week -Discharged on doxycycline  100 mg  BID x 7 days.  Refill provided -Continue minimal WBAT surgical shoe -Return to clinic 1 week suture removal   Thresa EMERSON Sar, [name] [facility]  Dr. Thresa EMERSON Sar, [name]    [address] [place], [place] [address]                Office [phone]  Fax [phone]

## 2023-09-27 NOTE — Progress Notes (Signed)
 PET/CT stress test consent

## 2023-10-01 ENCOUNTER — Ambulatory Visit: Payer: Medicare Other | Admitting: Podiatry

## 2023-10-01 NOTE — Progress Notes (Signed)
 Advanced Heart Failure Clinic Note    PCP: Wallie Drafts, NP (last seen 11/23) Cardiologist: Ammon Blunt, MD (to be established) HF provider: Wilburn Fillers, MD (last seen 01/25; returns in 1 month)  Chief Complaint: shortness of breath  HPI:  Tyler [name] is a 78 y/o male with a history of persistent atrial fibrillation 06/21 (not on anticoag), HTN, T2DM, CKD, cellulitis, hyperlipidemia, 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   Cardiac history dates back to 06/21 when he was first diagnosed with HF. Had also recently been diagnosed with atrial fibrillation.   Admitted 10/30/22 due to infection of right third toe. Noted his right third toe turning dark few days ago and complained of pain in the affected area as well as fever. Right foot x-ray showed soft tissue edema overlies the dorsum of the foot. No soft tissue gas or radiopaque foreign body. No radiographic findings of osteomyelitis. IV antibiotics given. ABI abn. Vascular consulted. RLE angiography done. Amputation of R 3rd toe done. Post-op had significant epistaxis L nare requiring insertion rhino rocket packing overnight, holding heparin /ASA. Restarted at discharge. Admitted 07/02/23 due to shortness of breath and worsening leg edema. Chronic diabetic foot ulcers. Found to have cellulitis involving the right lower extremity along with an elevated BNP of 1066. Patient was seen by podiatrist and underwent bilateral lower extremity imaging however they did not show any evidence of underlying osteomyelitis. Cardiology consulted. IV diuresed with transition to oral diuretics. Was in the ED 07/19/23 due to mechanical fall where he slipped and hit his knees on the cabinet. No head injury or LOC.    Was in the ED 08/03/23 due to shortness of breath and  worsening pedal edema. CXR  with pulmonary edema. No STEMI. Lasix  provided. Admitted 08/13/23 due to acute worsening of his SOB with associated orthopnea and PND. Cardiology consulted. IV lasix  given and then transitioned to an increased oral lasix  dose. Troponins 100's flat.   Admitted 09/11/23 due to productive cough, worsening SOB, orthopnea, fatigue and 10 pound weight gain. Also notes oozing from wounds on bottom of his feet. Found to have HF exacerbation, pneumonia, infected plantar ulcers on both feel and right foot osteomyelitis. Podiatry consulted. Bilateral foot MRI done which showed septic arthritis of right hallux IPJ. Subsequent amputation of right hallux done. Left foot wound had I & D done. Vascular saw patient and had right lower extremity arteriogram done with subsequent angioplasty and stent placement of right SFA and popliteal arteries.  Echo 09/20/20: EF 55-60% with 3+ TR Echo 07/03/23: EF 30-35% with moderate Tyler  He presents today for a follow-up HF visit with a chief complaint of minimal shortness of breath with moderate exertion. Much improved from last visit. Has associated fatigue (improving), pedal edema and continued wounds on feet which he feels are improving. Has bilateral walking boots on bilateral feel. Reports sleeping well on sofa with 2 pillows. Denies chest pain, cough, palpitations, abdominal distention, dizziness or weight gain.   Using sit down bike peddler for exercise. Says that he has upcoming podiatry appointment and says that he will never return to the wound center as he feels like their treatment caused his amputation.   Works at [employer]. Does not add salt to his food.   Spironolactone  was recently started by cardiology even though he had dizziness with it once  before. Denies any dizziness since starting it.   ROS: All systems negative except as listed in HPI, PMH and Problem List.  SH:  Social History   Socioeconomic History   Marital  status: Single    Spouse name: Not on file   Number of children: Not on file   Years of education: Not on file   Highest education level: Not on file  Occupational History    Comment: [employer]  Tobacco Use   Smoking status: Never   Smokeless tobacco: Never  Vaping Use   Vaping status: Never Used  Substance and Sexual Activity   Alcohol  use: Not Currently    Comment: rarely   Drug use: Never   Sexual activity: Not Currently    Birth control/protection: None  Other Topics Concern   Not on file  Social History Narrative   Lives with roommate   Social Drivers of Health   Financial Resource Strain: Low Risk  (03/20/2023)   Received from [facility], [facility]   Overall Financial Resource Strain (CARDIA)    Difficulty of Paying Living Expenses: Not hard at all  Food Insecurity: No Food Insecurity (09/13/2023)   Hunger Vital Sign    Worried About Running Out of Food in the Last Year: Never true    Ran Out of Food in the Last Year: Never true  Transportation Needs: Patient Declined (09/14/2023)   PRAPARE - Transportation    Lack of Transportation (Medical): Patient declined    Lack of Transportation (Non-Medical): Patient declined  Physical Activity: Not on file  Stress: No Stress Concern Present (09/07/2020)   Received from [facility], [facility]   [employer] [facility] - Occupational Stress Questionnaire    Feeling of Stress : Not at all  Social Connections: Unknown (02/01/2022)   Received from [facility], [facility]   Social Network    Social Network: Not on file  Intimate Partner Violence: Patient Declined (09/14/2023)   Humiliation, Afraid, Rape, and Kick questionnaire    Fear of Current or Ex-Partner: Patient declined    Emotionally Abused: Patient declined    Physically Abused: Patient declined    Sexually Abused: Patient declined    FH:  Family History  Problem Relation Age of Onset   Cancer Niece     Past  Medical History:  Diagnosis Date   (HFpEF) heart failure with preserved ejection fraction (HCC) 03/01/2020   a.) TTE 03/01/2020: EF 55-60%, mod MAC, mod AoV sclerosis, triv AR, mild TR, mod Tyler, RVSP 50-59; b.) TTE 09/10/2020: EF 55-60%, mod MAC, mod AoV sclerosis, mild TR, 3+ Tyler, RVSP 50-59; c.) TTE 09/26/2020: EF 55-60%, mild LA dil, triv PR, mild Tyler/TR, RVSP 37-49   Adenoma of left adrenal gland    Anemia    Arthritis    Atrial fibrillation and flutter (HCC)    a.) CHA2DS2VASc = 5 (age x2, HFpEF, HTN, T2DM);  b.) s/p CTI ablation 09/07/2020; c.) rate/rhythm maintained on oral carvedilol ; not on chronic anticoagulation therapy   CAD (coronary artery disease)    Cardiomegaly    CKD (chronic kidney disease), stage III (HCC)    DOE (dyspnea on exertion)    Drug-induced bradycardia    Gangrene of toe of left foot (HCC)    a.) 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    HLD (hyperlipidemia)    Hypertension    Long term current use of aspirin   Lymphedema of both lower extremities    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)    a.) s/p amputation 11/04/2022   Peripheral vascular disease (HCC)    Pleural effusion on right 09/09/2020   a.) s/p RIGHT thoracentesis with 2180 cc yield   Pneumonia    Presence of permanent cardiac pacemaker 09/10/2020   a.) TVP placement 09/10/2020 due to intermittent CHB in setting of urosepsis; b.) s/p PPM placement 09/15/2020: MDT Azure XT DR (SN: [id number])   Pulmonary hypertension (HCC) 03/01/2020   a.) TTE: 03/01/2020: RVSP 50-59; b.) TTE 09/10/2020: RVSP 50-59; c.) TTE 09/26/2020: RVSP 37-49   RA (rheumatoid arthritis) (HCC)    Rheumatic fever    Sepsis (HCC) 09/10/2020   a.) urosepsis --> BC x 2 sets and UC all grew out significant Proteus mirabilis; admitted to [facility] 09/07/2020 - 09/27/2020.   Sick sinus syndrome [facility])    a.) s/p MDT PPM placement 09/15/2020    T2DM (type 2 diabetes mellitus) (HCC)    Urinary retention    chronic, with indwelling Foley catheter and plans for a suprapubic   Wears dentures    full upper    Current Outpatient Medications  Medication Sig Dispense Refill   acetaminophen  (TYLENOL ) 500 MG tablet Take 2 tablets (1,000 mg total) by mouth 3 (three) times daily as needed (for pain).     aspirin  EC 81 MG tablet Take 1 tablet (81 mg total) by mouth daily. Swallow whole. 30 tablet 0   atorvastatin  (LIPITOR ) 10 MG tablet Take 1 tablet (10 mg total) by mouth daily at 6 PM. 30 tablet 1   bumetanide  (BUMEX ) 2 MG tablet Take 1 tablet (2 mg total) by mouth daily. 90 tablet 3   clopidogrel  (PLAVIX ) 75 MG tablet Take 1 tablet (75 mg total) by mouth daily with breakfast. 30 tablet 1   doxycycline  (VIBRA -TABS) 100 MG tablet Take 1 tablet (100 mg total) by mouth every 12 (twelve) hours for 10 days. 20 tablet 0   Finerenone  (KERENDIA ) 10 MG TABS Take 1 tablet (10 mg total) by mouth daily. 30 tablet 2   gentamicin  cream (GARAMYCIN ) 0.1 % Apply 1 Application topically 2 (two) times daily. 30 g 1   glipiZIDE  (GLUCOTROL ) 10 MG tablet Take 1 tablet (10 mg total) by mouth 2 (two) times daily. 60 tablet 2   losartan  (COZAAR ) 25 MG tablet Take 1 tablet (25 mg total) by mouth daily. 30 tablet 1   metFORMIN  (GLUCOPHAGE ) 1000 MG tablet Take 1 tablet (1,000 mg total) by mouth 2 (two) times daily. 60 tablet 2   metolazone  (ZAROXOLYN ) 2.5 MG tablet Take 1 tablet (2.5 mg total) by mouth 2 (two) times a week. On Mondays & Fridays     metoprolol  succinate (TOPROL -XL) 50 MG 24 hr tablet Take 1 tablet (50 mg total) by mouth daily. Take with or immediately following a meal. 30 tablet 2   Multiple Vitamins-Minerals (MULTIVITAMIN WITH MINERALS) tablet Take 1 tablet by mouth daily.     silver  sulfADIAZINE  (SILVADENE ) 1 % cream Apply to affected area daily 50 g 1   No current facility-administered medications for this visit.   Vitals:   10/02/23 1034  BP:  (!) 119/57  Pulse: 84  SpO2: 100%  Weight: 241 lb (109.3 kg)   Wt Readings from Last 3 Encounters:  10/02/23 241 lb (109.3 kg)  09/21/23 234 lb 12.6 oz (106.5 kg)  09/12/23 260 lb (117.9 kg)   Lab Results  Component Value Date  CREATININE 1.37 (H) 09/21/2023   CREATININE 1.19 09/20/2023   CREATININE 1.25 (H) 09/19/2023    PHYSICAL EXAM:  General:  Well appearing. No resp difficulty HEENT: normal Neck: supple. JVP flat. No lymphadenopathy or thryomegaly appreciated. Cor: PMI normal. Regular rhythm & rate, No rubs, gallops or murmurs. Lungs: clear Abdomen: soft, nontender, nondistended. No hepatosplenomegaly. No bruits or masses.  Extremities: no cyanosis, clubbing, rash, trace pitting edema bilateral lower legs Neuro: alert & oriented x3, cranial nerves grossly intact. Moves all 4 extremities w/o difficulty. Affect pleasant.   ECG: not done   ASSESSMENT & PLAN:  1: NICM with reduced ejection fraction- - suspect due to HTN/ atrial fibrillation - NYHA class II - euvolemic - weighing daily; reminded to call for overnight weight gain of > 2 pounds or a weekly weight gain of > 5 pounds - weight down 19 pounds from last visit here 3 weeks ago - Echo 09/20/20: EF 55-60% with 3+ TR - Echo 07/03/23: EF 30-35% with moderate Tyler - continue bumex  2mg  daily - continue kerendia  10mg  daily (refilled today) - continue losartan  25mg  daily - continue metoprolol  succinate 50mg  daily - patient had stopped entresto  due to dizziness but is now taking it with losartan ; will have nurse call to recommend stopping entresto   - continue spironolactone  25mg  daily - previous spironolactone  use caused dizziness but he's currently tolerating it - patient says that he's no longer using metolazone  - not a candidate for SGLT2 due to chronic foley - BMET/ pBNP today - saw HF provider (Alluri) 01/25 - to have PET myocardial perfusion test - BNP 09/11/23 was 3874.2  2: HTN- - BP 119/57 - saw PCP  (McClanahan) 11/23 - BMP 09/21/23 showed sodium 135, potassium 3.7, creatinine 1.37 & GFR 52 - BMET today  3: Atrial fibrillation- - [facility] 06/[facility]; to get established with Dr. Ammon - ablation 09/07/20 - continue ASA 81mg  - eliquis  caused dizziness & vision changes  4: DM- - A1c 07/02/23 was 7.2% - continue glipizide  10mg  BID - continue metformin  1000mg  BID  5: Mobitz type 2 AV block- - DDD MDT PPM placed 09/15/20  - <0.1% time in AT/AF - atrial impedance 437 ohms - RV impedance 532 ohms - per cardiology note, he's to get set up with pacemaker clinic through [facility]  6: Lymphedema- - stage 2 - limited in his ability to exercise due to bilateral walking boots - tried compression socks in the past but they became too tight and cut into his legs - edema improving  Return in 2 months, sooner if needed.

## 2023-10-02 ENCOUNTER — Other Ambulatory Visit: Payer: Self-pay | Admitting: Emergency Medicine

## 2023-10-02 ENCOUNTER — Encounter: Payer: Self-pay | Admitting: Family

## 2023-10-02 ENCOUNTER — Ambulatory Visit: Payer: BC Managed Care – PPO | Attending: Family | Admitting: Family

## 2023-10-02 VITALS — BP 119/57 | HR 84 | Wt 241.0 lb

## 2023-10-02 DIAGNOSIS — I5022 Chronic systolic (congestive) heart failure: Secondary | ICD-10-CM

## 2023-10-02 DIAGNOSIS — I441 Atrioventricular block, second degree: Secondary | ICD-10-CM | POA: Diagnosis not present

## 2023-10-02 DIAGNOSIS — Z79899 Other long term (current) drug therapy: Secondary | ICD-10-CM | POA: Diagnosis not present

## 2023-10-02 DIAGNOSIS — N183 Chronic kidney disease, stage 3 unspecified: Secondary | ICD-10-CM | POA: Diagnosis not present

## 2023-10-02 DIAGNOSIS — E785 Hyperlipidemia, unspecified: Secondary | ICD-10-CM | POA: Insufficient documentation

## 2023-10-02 DIAGNOSIS — I5032 Chronic diastolic (congestive) heart failure: Secondary | ICD-10-CM | POA: Diagnosis not present

## 2023-10-02 DIAGNOSIS — Z7984 Long term (current) use of oral hypoglycemic drugs: Secondary | ICD-10-CM | POA: Insufficient documentation

## 2023-10-02 DIAGNOSIS — Z7982 Long term (current) use of aspirin: Secondary | ICD-10-CM | POA: Insufficient documentation

## 2023-10-02 DIAGNOSIS — Z950 Presence of cardiac pacemaker: Secondary | ICD-10-CM | POA: Insufficient documentation

## 2023-10-02 DIAGNOSIS — Z7901 Long term (current) use of anticoagulants: Secondary | ICD-10-CM | POA: Insufficient documentation

## 2023-10-02 DIAGNOSIS — E1151 Type 2 diabetes mellitus with diabetic peripheral angiopathy without gangrene: Secondary | ICD-10-CM | POA: Diagnosis not present

## 2023-10-02 DIAGNOSIS — I890 Lymphedema, not elsewhere classified: Secondary | ICD-10-CM | POA: Diagnosis not present

## 2023-10-02 DIAGNOSIS — I130 Hypertensive heart and chronic kidney disease with heart failure and stage 1 through stage 4 chronic kidney disease, or unspecified chronic kidney disease: Secondary | ICD-10-CM | POA: Diagnosis not present

## 2023-10-02 DIAGNOSIS — E1122 Type 2 diabetes mellitus with diabetic chronic kidney disease: Secondary | ICD-10-CM | POA: Insufficient documentation

## 2023-10-02 DIAGNOSIS — I2510 Atherosclerotic heart disease of native coronary artery without angina pectoris: Secondary | ICD-10-CM | POA: Insufficient documentation

## 2023-10-02 DIAGNOSIS — N1831 Chronic kidney disease, stage 3a: Secondary | ICD-10-CM

## 2023-10-02 DIAGNOSIS — M069 Rheumatoid arthritis, unspecified: Secondary | ICD-10-CM | POA: Insufficient documentation

## 2023-10-02 DIAGNOSIS — I428 Other cardiomyopathies: Secondary | ICD-10-CM | POA: Insufficient documentation

## 2023-10-02 DIAGNOSIS — I10 Essential (primary) hypertension: Secondary | ICD-10-CM | POA: Diagnosis not present

## 2023-10-02 DIAGNOSIS — I4819 Other persistent atrial fibrillation: Secondary | ICD-10-CM | POA: Insufficient documentation

## 2023-10-02 DIAGNOSIS — R509 Fever, unspecified: Secondary | ICD-10-CM | POA: Insufficient documentation

## 2023-10-02 MED ORDER — KERENDIA 10 MG PO TABS: 10.0000 mg | ORAL_TABLET | Freq: Every day | ORAL | 2 refills | Status: DC

## 2023-10-03 LAB — PRO B NATRIURETIC PEPTIDE: NT-Pro BNP: 3757 pg/mL — ABNORMAL HIGH (ref 0–486)

## 2023-10-04 ENCOUNTER — Ambulatory Visit (INDEPENDENT_AMBULATORY_CARE_PROVIDER_SITE_OTHER): Payer: Medicare Other | Admitting: Podiatry

## 2023-10-04 DIAGNOSIS — E0843 Diabetes mellitus due to underlying condition with diabetic autonomic (poly)neuropathy: Secondary | ICD-10-CM

## 2023-10-04 DIAGNOSIS — R600 Localized edema: Secondary | ICD-10-CM | POA: Diagnosis not present

## 2023-10-04 DIAGNOSIS — L97522 Non-pressure chronic ulcer of other part of left foot with fat layer exposed: Secondary | ICD-10-CM

## 2023-10-04 DIAGNOSIS — S98111A Complete traumatic amputation of right great toe, initial encounter: Secondary | ICD-10-CM

## 2023-10-04 NOTE — Progress Notes (Signed)
 Chief Complaint  Patient presents with   Routine Post Op    POV DOS 09/15/2023 RT great toe amputation with left foot wound debridement and application of wound graft (Acell Micromatrix powder w/ wound matrix) I guess they're alright.    Subjective:  Patient PMHx T2DM, PVD presents today status post RT great toe amputation with left foot wound debridement and application of wound graft (Acell Micromatrix powder w/ wound matrix)  performed inpatient at [facility].  DOS: 09/15/2023.  Subsequently discharged 09/21/2023.  Patient doing well.  WBAT surgical shoe.  Lab Results  Component Value Date   HGBA1C 7.2 (H) 07/02/2023   HGBA1C 6.4 (H) 10/30/2022   HGBA1C 5.5 06/28/2020    Past Medical History:  Diagnosis Date   (HFpEF) heart failure with preserved ejection fraction (HCC) 03/01/2020   a.) TTE 03/01/2020: EF 55-60%, mod MAC, mod AoV sclerosis, triv AR, mild TR, mod MR, RVSP 50-59; b.) TTE 09/10/2020: EF 55-60%, mod MAC, mod AoV sclerosis, mild TR, 3+ MR, RVSP 50-59; c.) TTE 09/26/2020: EF 55-60%, mild LA dil, triv PR, mild MR/TR, RVSP 37-49   Adenoma of left adrenal gland    Anemia    Arthritis    Atrial fibrillation and flutter (HCC)    a.) CHA2DS2VASc = 5 (age x2, HFpEF, HTN, T2DM);  b.) s/p CTI ablation 09/07/2020; c.) rate/rhythm maintained on oral carvedilol ; not on chronic anticoagulation therapy   CAD (coronary artery disease)    Cardiomegaly    CKD (chronic kidney disease), stage III (HCC)    DOE (dyspnea on exertion)    Drug-induced bradycardia    Gangrene of toe of left foot (HCC)    a.) 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    HLD (hyperlipidemia)    Hypertension    Long term current use of aspirin     Lymphedema of both lower extremities    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)    a.) s/p amputation 11/04/2022   Peripheral vascular disease (HCC)    Pleural effusion on right  09/09/2020   a.) s/p RIGHT thoracentesis with 2180 cc yield   Pneumonia    Presence of permanent cardiac pacemaker 09/10/2020   a.) TVP placement 09/10/2020 due to intermittent CHB in setting of urosepsis; b.) s/p PPM placement 09/15/2020: MDT Azure XT DR (SN: [id number])   Pulmonary hypertension (HCC) 03/01/2020   a.) TTE: 03/01/2020: RVSP 50-59; b.) TTE 09/10/2020: RVSP 50-59; c.) TTE 09/26/2020: RVSP 37-49   RA (rheumatoid arthritis) (HCC)    Rheumatic fever    Sepsis (HCC) 09/10/2020   a.) urosepsis --> BC x 2 sets and UC all grew out significant Proteus mirabilis; admitted to [facility] 09/07/2020 - 09/27/2020.   Sick sinus syndrome [facility])    a.) s/p MDT PPM placement 09/15/2020   T2DM (type 2 diabetes mellitus) (HCC)    Urinary retention    chronic, with indwelling Foley catheter and plans for a suprapubic   Wears dentures    full upper    Past Surgical History:  Procedure Laterality Date   AMPUTATION TOE Right 11/04/2022   Procedure: AMPUTATION TOE;  Surgeon: Janit Thresa HERO, [name];  [name];  Service: Podiatry;  Laterality: Right;   AMPUTATION TOE Right 09/15/2023   Procedure: AMPUTATION TOE;  Surgeon: Joya Stabs, [name];  [name];  Service: Orthopedics/Podiatry;  Laterality: Right;  Right hallux amputation   BONE BIOPSY Right 04/05/2023  Procedure: BONE BIOPSY THIRD & FOURTH;  Surgeon: Janit Thresa HERO, [name];  [name];  Service: Podiatry;  Laterality: Right;   CARDIAC ELECTROPHYSIOLOGY STUDY AND ABLATION N/A 09/07/2020   Procedure: CARDIAC EP STUDY AND ABLATION (CTI)   CATARACT EXTRACTION W/PHACO Right 01/16/2017   Procedure: CATARACT EXTRACTION PHACO AND INTRAOCULAR LENS PLACEMENT (IOC)  Right Complicated;  Surgeon: Dene Etienne, MD;  Location: [facility];  Service: Ophthalmology;  Laterality: Right;  IVA Block Healon 5 malyugin vision blue Diabetic - oral meds   CATARACT EXTRACTION W/PHACO Left 10/23/2022    Procedure: CATARACT EXTRACTION PHACO AND INTRAOCULAR LENS PLACEMENT (IOC) LEFT DIABETIC;  Surgeon: Jaye Fallow, MD;  Location: [facility];  Service: Ophthalmology;  Laterality: Left;  Diabetic   COLONOSCOPY     INCISION AND DRAINAGE OF WOUND Left 09/15/2023   Procedure: IRRIGATION AND DEBRIDEMENT WOUND;  Surgeon: Joya Stabs, [name];  [name];  Service: Orthopedics/Podiatry;  Laterality: Left;  Left foot wound debridement, possible graft/biopsy   LOWER EXTREMITY ANGIOGRAPHY Right 11/02/2022   Procedure: Lower Extremity Angiography;  Surgeon: Marea Selinda RAMAN, MD;  Location: [facility];  Service: Cardiovascular;  Laterality: Right;   LOWER EXTREMITY ANGIOGRAPHY Right 09/13/2023   Procedure: Lower Extremity Angiography;  Surgeon: Jama Cordella MATSU, MD;  Location: [facility];  Service: Cardiovascular;  Laterality: Right;   METATARSAL HEAD EXCISION Left 07/05/2018   Procedure: RESECTION FIRST METATARSAL INFECTED BONE AND SOFT TISSUE;  Surgeon: Lilli Cough, [name];  [name];  Service: Podiatry;  Laterality: Left;   METATARSAL HEAD EXCISION Right 04/05/2023   Procedure: METATARSAL HEAD EXCISION THIRD & FOURTH;  Surgeon: Janit Thresa HERO, [name];  [name];  Service: Podiatry;  Laterality: Right;   PACEMAKER INSERTION  09/15/2020   TOE AMPUTATION Left    TONSILLECTOMY      Allergies  Allergen Reactions   Eliquis  [Apixaban ]     Dizziness  and vision change   Spironolactone  Other (See Comments)    dizziness    RT foot 10/04/2023  LT foot 10/04/2023  Objective/Physical Exam Neurovascular status intact.  Skin is warm to touch.  Incisions intact.  Moderate edema with mild localized erythema.  Scant serous drainage  Ulcer noted to the plantar aspect of the fifth MTP of the right foot measuring approximately 1.5 x 1.5 x 0.2 cm.  Again, granular wound base.  No maceration.  No exposed bone muscle tendon ligament or joint.  Periwound is  callused  Mostly unchanged.  Ulcer noted plantar aspect of the left foot measuring approximately 2.0 x 2.0 x 0.2 cm.  Granular wound base.  No maceration.  Wound appears very dry.  No exposed bone   LOWER EXTREMITY ANGIOGRAPHY 09/13/2023 Findings:  The right common femoral is widely patent as is the profunda femoris.  The SFA has a pre-existing stent which is patent.  There is moderate 40 to 50% stenosis at the leading edge of the existing stent proximally.  Distally there is a greater than 70% stenosis at the distal end of the stent and then there are diffuse lesions some of which are 80% particularly the 1 at the level of the femoral condyles in the mid popliteal.  The distal popliteal demonstrates disease but no hemodynamically significant stenosis and the trifurcation is heavily diseased with occlusion of the tibioperoneal trunk, peroneal and posterior tibial which demonstrates occlusion throughout its course.  The anterior tibial is patent from its origin down to the foot which fills the dorsalis  pedis and the pedal arch.   Following angioplasty the SFA and proximal popliteal demonstrate significant residual stenosis as described above.  Therefore a life stent is placed and postdilated with a 6 mm Lutonix balloon.  There is 1 focal area in the mid popliteal as described above which had significant residual stenosis in this area was treated with a 7 mm balloon inflation.  Following this there is wide patency of the entire SFA and popliteal with less than 10% residual stenosis throughout.  The anterior tibial is the single-vessel runoff is preserved all the way down to the foot..  Radiographic Exam RT foot 09/27/2023:  Unchanged from prior x-rays 09/15/2023.  Interval great toe amputation at the level of the MTP.  Prior third toe amputation at the mid metatarsal level with interval resection of the fourth metatarsal head as well.  No acute fracture.  No acute erosions concerning for residual  osteomyelitis.  Assessment: 1. s/p right great toe amputation. LT foot wound debridement w/ application of Acell. DOS: 09/15/2023 inpatient at [facility]. Dr. Sikora 2.  Peripheral vascular disease 3.  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  Chronic lower extremity edema bilateral   Plan of Care:  -Patient was evaluated.  Sutures removed today -Medically necessary excisional debridement including subcutaneous tissue was performed today to the left foot wound.  Excisional debridement of the necrotic nonviable tissue down to healthier bleeding viable tissue was performed with postdebridement measurement same as pre- -continue doxycycline  100 mg BID x 7 days until completed -Decision made today to apply multilayer Unna boot compression wraps to the bilateral lower extremities due to the chronic edema to the bilateral legs.  Prefer that he leaves the dressings on for an entire week if he can tolerate.  Patient is amenable to this plan -Continue minimal WBAT surgical shoe -Return to clinic 1 week   Thresa EMERSON Sar, [name] [facility]  Dr. Thresa EMERSON Sar, [name]    [address] [place], [place] [address]                Office [phone]  Fax [phone]

## 2023-10-07 ENCOUNTER — Other Ambulatory Visit: Payer: Self-pay | Admitting: Cardiology

## 2023-10-07 DIAGNOSIS — I5020 Unspecified systolic (congestive) heart failure: Secondary | ICD-10-CM

## 2023-10-14 ENCOUNTER — Other Ambulatory Visit (INDEPENDENT_AMBULATORY_CARE_PROVIDER_SITE_OTHER): Payer: Self-pay | Admitting: Vascular Surgery

## 2023-10-14 ENCOUNTER — Ambulatory Visit: Payer: Medicare Other | Admitting: Podiatry

## 2023-10-14 DIAGNOSIS — I739 Peripheral vascular disease, unspecified: Secondary | ICD-10-CM

## 2023-10-15 ENCOUNTER — Ambulatory Visit (INDEPENDENT_AMBULATORY_CARE_PROVIDER_SITE_OTHER): Payer: BC Managed Care – PPO | Admitting: Podiatry

## 2023-10-15 ENCOUNTER — Encounter: Payer: Self-pay | Admitting: Podiatry

## 2023-10-15 DIAGNOSIS — R600 Localized edema: Secondary | ICD-10-CM

## 2023-10-15 DIAGNOSIS — E0843 Diabetes mellitus due to underlying condition with diabetic autonomic (poly)neuropathy: Secondary | ICD-10-CM

## 2023-10-15 DIAGNOSIS — S98111A Complete traumatic amputation of right great toe, initial encounter: Secondary | ICD-10-CM

## 2023-10-15 DIAGNOSIS — L97522 Non-pressure chronic ulcer of other part of left foot with fat layer exposed: Secondary | ICD-10-CM | POA: Diagnosis not present

## 2023-10-15 NOTE — Progress Notes (Signed)
Chief Complaint  Patient presents with   Routine Post Op    POV DOS: 09/15/2023 RT great toe amputation with left foot wound debridement and application of wound graft (Acell Micromatrix powder w/ wound matrix) "I think they're doing good.  I wore these boots, because of the weather.  I seem to be able to walk better in these than the others."      Subjective:  Patient PMHx T2DM, PVD presents today status post RT great toe amputation with left foot wound debridement and application of wound graft (Acell Micromatrix powder w/ wound matrix)  performed inpatient at [facility].  DOS: 09/15/2023.  Subsequently discharged 09/21/2023.    Patient tolerated the multilayer Unna boot compression wraps well throughout the week.  No new complaints  Lab Results  Component Value Date   HGBA1C 7.2 (H) 07/02/2023   HGBA1C 6.4 (H) 10/30/2022   HGBA1C 5.5 06/28/2020    Past Medical History:  Diagnosis Date   (HFpEF) heart failure with preserved ejection fraction (HCC) 03/01/2020   a.) TTE 03/01/2020: EF 55-60%, mod MAC, mod AoV sclerosis, triv AR, mild TR, mod MR, RVSP 50-59; b.) TTE 09/10/2020: EF 55-60%, mod MAC, mod AoV sclerosis, mild TR, 3+ MR, RVSP 50-59; c.) TTE 09/26/2020: EF 55-60%, mild LA dil, triv PR, mild MR/TR, RVSP 37-49   Adenoma of left adrenal gland    Anemia    Arthritis    Atrial fibrillation and flutter (HCC)    a.) CHA2DS2VASc = 5 (age x2, HFpEF, HTN, T2DM);  b.) s/p CTI ablation 09/07/2020; c.) rate/rhythm maintained on oral carvedilol; not on chronic anticoagulation therapy   CAD (coronary artery disease)    Cardiomegaly    CKD (chronic kidney disease), stage III (HCC)    DOE (dyspnea on exertion)    Drug-induced bradycardia    Gangrene of toe of left foot (HCC)    a.) 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    HLD (hyperlipidemia)    Hypertension    Long term current use of aspirin    Lymphedema  of both lower extremities    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)    a.) s/p amputation 11/04/2022   Peripheral vascular disease (HCC)    Pleural effusion on right 09/09/2020   a.) s/p RIGHT thoracentesis with 2180 cc yield   Pneumonia    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   Sick sinus syndrome [facility])    a.) s/p MDT PPM placement 09/15/2020   T2DM (type 2 diabetes mellitus) (HCC)    Urinary retention    chronic, with indwelling Foley catheter and plans for a suprapubic   Wears dentures    full upper    Past Surgical History:  Procedure Laterality Date   AMPUTATION TOE Right 11/04/2022   Procedure: AMPUTATION TOE;  Surgeon: Felecia Shelling, [name];  [name];  Service: Podiatry;  Laterality:  Right;   AMPUTATION TOE Right 09/15/2023   Procedure: AMPUTATION TOE;  Surgeon: Louann Sjogren, [name];  [name];  Service: Orthopedics/Podiatry;  Laterality: Right;  Right hallux amputation   BONE BIOPSY Right 04/05/2023   Procedure: BONE BIOPSY THIRD & FOURTH;  Surgeon: Felecia Shelling, [name];  [name];  Service: Podiatry;  Laterality: Right;   CARDIAC ELECTROPHYSIOLOGY STUDY AND ABLATION N/A 09/07/2020   Procedure: CARDIAC EP STUDY AND ABLATION (CTI)   CATARACT EXTRACTION W/PHACO Right 01/16/2017   Procedure: CATARACT EXTRACTION PHACO AND INTRAOCULAR LENS PLACEMENT (IOC)  Right Complicated;  Surgeon: Lockie Mola, MD;  Location: [facility]  SURGERY [facility];  Service: Ophthalmology;  Laterality: Right;  IVA Block Healon 5 malyugin vision blue Diabetic - oral meds   CATARACT EXTRACTION W/PHACO Left 10/23/2022   Procedure: CATARACT EXTRACTION PHACO AND INTRAOCULAR LENS PLACEMENT (IOC) LEFT DIABETIC;  Surgeon: Galen Manila, MD;  Location: [facility] SURGERY [facility];  Service: Ophthalmology;  Laterality: Left;  Diabetic   COLONOSCOPY     INCISION AND DRAINAGE OF WOUND Left 09/15/2023   Procedure: IRRIGATION AND DEBRIDEMENT WOUND;  Surgeon: Louann Sjogren, [name];  [name];  Service: Orthopedics/Podiatry;  Laterality: Left;  Left foot wound debridement, possible graft/biopsy   LOWER EXTREMITY ANGIOGRAPHY Right 11/02/2022   Procedure: Lower Extremity Angiography;  Surgeon: Annice Needy, MD;  Location: [facility] INVASIVE CV LAB;  Service: Cardiovascular;  Laterality: Right;   LOWER EXTREMITY ANGIOGRAPHY Right 09/13/2023   Procedure: Lower Extremity Angiography;  Surgeon: Renford Dills, MD;  Location: [facility] INVASIVE CV LAB;  Service: Cardiovascular;  Laterality: Right;   METATARSAL HEAD EXCISION Left 07/05/2018   Procedure: RESECTION FIRST METATARSAL INFECTED BONE AND SOFT TISSUE;  Surgeon: Recardo Evangelist, [name];  [name];  Service: Podiatry;  Laterality: Left;   METATARSAL HEAD EXCISION Right 04/05/2023   Procedure: METATARSAL HEAD EXCISION THIRD & FOURTH;  Surgeon: Felecia Shelling, [name];  [name];  Service: Podiatry;  Laterality: Right;   PACEMAKER INSERTION  09/15/2020   TOE AMPUTATION Left    TONSILLECTOMY      Allergies  Allergen Reactions   Eliquis [Apixaban]     Dizziness  and vision change   Spironolactone Other (See Comments)    dizziness    RT foot 10/04/2023  LT foot 10/04/2023  Objective/Physical Exam Neurovascular status intact.  Skin is warm to touch.  Incisions intact.  Moderate edema with mild localized erythema.  Scant serous drainage  Ulcer noted to the plantar aspect of the fifth MTP of  the right foot measuring approximately 1.5 x 1.5 x 0.2 cm.  Again, granular wound base.  No maceration.  No exposed bone muscle tendon ligament or joint.  Periwound is callused  Mostly unchanged.  Ulcer noted plantar aspect of the left foot measuring approximately 2.0 x 2.0 x 0.2 cm.  Granular wound base.  No maceration.  Wound appears very dry.  No exposed bone   LOWER EXTREMITY ANGIOGRAPHY 09/13/2023 Findings:  The right common femoral is widely patent as is the profunda femoris.  The SFA has a pre-existing stent which is patent.  There is moderate 40 to 50% stenosis at the leading edge of the existing stent proximally.  Distally there is a greater than 70% stenosis at the distal end of the stent and then there are diffuse lesions some of which are 80% particularly the 1 at the level of the femoral condyles in the mid popliteal.  The distal popliteal demonstrates disease but no hemodynamically significant stenosis  and the trifurcation is heavily diseased with occlusion of the tibioperoneal trunk, peroneal and posterior tibial which demonstrates occlusion throughout its course.  The anterior tibial is patent from its origin down to the foot which fills the dorsalis pedis and the pedal arch.   Following angioplasty the SFA and proximal popliteal demonstrate significant residual stenosis as described above.  Therefore a life stent is placed and postdilated with a 6 mm Lutonix balloon.  There is 1 focal area in the mid popliteal as described above which had significant residual stenosis in this area was treated with a 7 mm balloon inflation.  Following this there is wide patency of the entire SFA and popliteal with less than 10% residual stenosis throughout.  The anterior tibial is the single-vessel runoff is preserved all the way down to the foot..  Radiographic Exam RT foot 09/27/2023:  Unchanged from prior x-rays 09/15/2023.  Interval great toe amputation at the level of the MTP.  Prior third toe  amputation at the mid metatarsal level with interval resection of the fourth metatarsal head as well.  No acute fracture.  No acute erosions concerning for residual osteomyelitis.  Assessment: 1. s/p right great toe amputation. LT foot wound debridement w/ application of Acell. DOS: 09/15/2023 inpatient at [facility]. Dr. Ralene Cork 2.  Peripheral vascular disease 3.  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  Chronic lower extremity edema bilateral   Plan of Care:  -Patient was evaluated.  Overall improvement.  We will continue the same regimen -Medically necessary excisional debridement including subcutaneous tissue was performed today to the left foot wound.  Excisional debridement of the necrotic nonviable tissue down to healthier bleeding viable tissue was performed with postdebridement measurement same as pre- -reapplication of multilayer Unna boot compression wraps to the bilateral lower extremities due to the chronic edema to the bilateral legs.  Prefer that he leaves the dressings on for an entire week if he can tolerate.  Patient is amenable to this plan -Continue minimal WBAT surgical shoe -Patient completed the oral Doxy prescribed last visit -Return to clinic 1 week   Felecia Shelling, [name] [facility]  Dr. Felecia Shelling, [name]    [address] [place], [place] [address]                Office [phone]  Fax [phone]

## 2023-10-17 ENCOUNTER — Other Ambulatory Visit: Payer: Self-pay

## 2023-10-17 MED ORDER — CLOPIDOGREL BISULFATE 75 MG PO TABS: 75.0000 mg | ORAL_TABLET | Freq: Every day | ORAL | 11 refills | Status: DC

## 2023-10-17 MED ORDER — LOSARTAN POTASSIUM 25 MG PO TABS: 25.0000 mg | ORAL_TABLET | Freq: Every day | ORAL | 11 refills | Status: DC

## 2023-10-17 MED ORDER — ATORVASTATIN CALCIUM 10 MG PO TABS: 10.0000 mg | ORAL_TABLET | Freq: Every day | ORAL | 11 refills | Status: AC

## 2023-10-18 ENCOUNTER — Encounter (INDEPENDENT_AMBULATORY_CARE_PROVIDER_SITE_OTHER): Payer: Medicare Other

## 2023-10-18 ENCOUNTER — Encounter (INDEPENDENT_AMBULATORY_CARE_PROVIDER_SITE_OTHER): Payer: Medicare Other | Admitting: Nurse Practitioner

## 2023-10-28 ENCOUNTER — Encounter: Payer: Self-pay | Admitting: Family

## 2023-10-28 ENCOUNTER — Ambulatory Visit: Payer: Self-pay | Attending: Family | Admitting: Family

## 2023-10-28 VITALS — BP 140/71 | HR 88 | Wt 240.0 lb

## 2023-10-28 DIAGNOSIS — Z7982 Long term (current) use of aspirin: Secondary | ICD-10-CM | POA: Insufficient documentation

## 2023-10-28 DIAGNOSIS — I5032 Chronic diastolic (congestive) heart failure: Secondary | ICD-10-CM | POA: Insufficient documentation

## 2023-10-28 DIAGNOSIS — I428 Other cardiomyopathies: Secondary | ICD-10-CM | POA: Insufficient documentation

## 2023-10-28 DIAGNOSIS — I5022 Chronic systolic (congestive) heart failure: Secondary | ICD-10-CM

## 2023-10-28 DIAGNOSIS — Z950 Presence of cardiac pacemaker: Secondary | ICD-10-CM | POA: Insufficient documentation

## 2023-10-28 DIAGNOSIS — M069 Rheumatoid arthritis, unspecified: Secondary | ICD-10-CM | POA: Insufficient documentation

## 2023-10-28 DIAGNOSIS — E785 Hyperlipidemia, unspecified: Secondary | ICD-10-CM | POA: Insufficient documentation

## 2023-10-28 DIAGNOSIS — E1122 Type 2 diabetes mellitus with diabetic chronic kidney disease: Secondary | ICD-10-CM | POA: Insufficient documentation

## 2023-10-28 DIAGNOSIS — I890 Lymphedema, not elsewhere classified: Secondary | ICD-10-CM | POA: Insufficient documentation

## 2023-10-28 DIAGNOSIS — I441 Atrioventricular block, second degree: Secondary | ICD-10-CM | POA: Insufficient documentation

## 2023-10-28 DIAGNOSIS — I2510 Atherosclerotic heart disease of native coronary artery without angina pectoris: Secondary | ICD-10-CM | POA: Insufficient documentation

## 2023-10-28 DIAGNOSIS — Z79899 Other long term (current) drug therapy: Secondary | ICD-10-CM | POA: Insufficient documentation

## 2023-10-28 DIAGNOSIS — N183 Chronic kidney disease, stage 3 unspecified: Secondary | ICD-10-CM | POA: Insufficient documentation

## 2023-10-28 DIAGNOSIS — I4819 Other persistent atrial fibrillation: Secondary | ICD-10-CM | POA: Insufficient documentation

## 2023-10-28 DIAGNOSIS — N1831 Chronic kidney disease, stage 3a: Secondary | ICD-10-CM

## 2023-10-28 DIAGNOSIS — Z7984 Long term (current) use of oral hypoglycemic drugs: Secondary | ICD-10-CM | POA: Insufficient documentation

## 2023-10-28 DIAGNOSIS — I130 Hypertensive heart and chronic kidney disease with heart failure and stage 1 through stage 4 chronic kidney disease, or unspecified chronic kidney disease: Secondary | ICD-10-CM | POA: Insufficient documentation

## 2023-10-28 DIAGNOSIS — I10 Essential (primary) hypertension: Secondary | ICD-10-CM

## 2023-10-28 DIAGNOSIS — Z91148 Patient's other noncompliance with medication regimen for other reason: Secondary | ICD-10-CM | POA: Insufficient documentation

## 2023-10-28 NOTE — Patient Instructions (Addendum)
Go DOWN to LOWER LEVEL (LL) to have your blood work completed inside of [employer] office.  We will only call you if the results are abnormal or if the provider would like to make medication changes.   It was good to see you today. Thank you for bringing your medications with you today.

## 2023-10-28 NOTE — Progress Notes (Signed)
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   Cardiac history dates back to 06/21 when he was first diagnosed with HF. Had also recently been diagnosed with atrial fibrillation.   Admitted 10/30/22 due to infection of right third toe. Noted his right third toe turning dark few days ago and complained of pain in the affected area as well as fever. Right foot x-ray showed soft tissue edema overlies the dorsum of the foot. No soft tissue gas or radiopaque foreign body. No radiographic findings of osteomyelitis. IV antibiotics given. ABI abn. Vascular consulted. RLE angiography done. Amputation of R 3rd toe done. Post-op had significant epistaxis L nare requiring insertion rhino rocket packing overnight, holding heparin/ASA. Restarted at discharge. Admitted 07/02/23 due to shortness of breath and worsening leg edema. Chronic diabetic foot ulcers. Found to have cellulitis involving the right lower extremity along with an elevated BNP of 1066. Patient was seen by podiatrist and underwent bilateral lower extremity imaging however they did not show any evidence of underlying osteomyelitis. Cardiology consulted. IV diuresed with transition to oral diuretics. Was in the ED 07/19/23 due to mechanical fall where he slipped and hit his knees on the cabinet. No head injury or LOC.    Was in the ED 08/03/23 due to shortness of breath and worsening pedal edema. CXR  with pulmonary edema. No  STEMI. Lasix provided. Admitted 08/13/23 due to acute worsening of his SOB with associated orthopnea and PND. Cardiology consulted. IV lasix given and then transitioned to an increased oral lasix dose. Troponins 100's flat.   Admitted 09/11/23 due to productive cough, worsening SOB, orthopnea, fatigue and 10 pound weight gain. Also notes oozing from wounds on bottom of his feet. Found to have HF exacerbation, pneumonia, infected plantar ulcers on both feel and right foot osteomyelitis. Podiatry consulted. Bilateral foot MRI done which showed septic arthritis of right hallux IPJ. Subsequent amputation of right hallux done. Left foot wound had I & D done. Vascular saw patient and had right lower extremity arteriogram done with subsequent angioplasty and stent placement of right SFA and popliteal arteries.  Echo 09/20/20: EF 55-60% with 3+ TR Echo 07/03/23: EF 30-35% with moderate Tyler  He presents today for a follow-up HF visit with a chief complaint of minimal shortness of breath with moderate exertion. Has associated fatigue, occasional chest pain, head congestion, little cough and easy bruising along with this. Denies palpitations, abdominal distention, pedal edema, dizziness, difficulty sleeping or weight gain. Reports having a good appetite and is not adding salt to his food.   Both lower legs wrapped by podiatry but he's hoping to get them removed soon. Has upcoming appt with [facility] HF provider but he says that he doesn't plan on keeping that appointment because he "didn't like him".   He told CMA that all the medication bottles that he brought are what he's taking but then told me that he stopped a couple because he was feeling bad and now feels "great". Picks up his losartan  bottle and says this is the one that he stopped and when reviewing the rest of his medication bottles, he confirms that he's taking the other ones.    ROS: All systems negative except as listed in HPI, PMH and Problem  List.  SH:  Social History   Socioeconomic History   Marital status: Single    Spouse name: Not on file   Number of children: Not on file   Years of education: Not on file   Highest education level: Not on file  Occupational History    Comment: [employer]  Tobacco Use   Smoking status: Never   Smokeless tobacco: Never  Vaping Use   Vaping status: Never Used  Substance and Sexual Activity   Alcohol use: Not Currently    Comment: rarely   Drug use: Never   Sexual activity: Not Currently    Birth control/protection: None  Other Topics Concern   Not on file  Social History Narrative   Lives with roommate   Social Drivers of Health   Financial Resource Strain: Low Risk  (03/20/2023)   [facility], [facility]   Overall Financial Resource Strain (CARDIA)    Difficulty of Paying Living Expenses: Not hard at all  Food Insecurity: No Food Insecurity (09/13/2023)   Hunger Vital Sign    Worried About Running Out of Food in the Last Year: Never true    Ran Out of Food in the Last Year: Never true  Transportation Needs: Patient Declined (09/14/2023)   PRAPARE - Transportation    Lack of Transportation (Medical): Patient declined    Lack of Transportation (Non-Medical): Patient declined  Physical Activity: Not on file  Stress: No Stress Concern Present (09/07/2020)   Received from [facility], [facility]   [employer] of Occupational Health - Occupational Stress Questionnaire    Feeling of Stress : Not at all  Social Connections: Unknown (02/01/2022)   Received from [facility], [facility]   Social Network    Social Network: Not on file  Intimate Partner Violence: Patient Declined (09/14/2023)   Humiliation, Afraid, Rape, and Kick questionnaire    Fear of Current or Ex-Partner: Patient declined    Emotionally Abused: Patient declined    Physically Abused: Patient declined    Sexually Abused: Patient declined    FH:  Family  History  Problem Relation Age of Onset   Cancer Niece     Past Medical History:  Diagnosis Date   (HFpEF) heart failure with preserved ejection fraction (HCC) 03/01/2020   a.) TTE 03/01/2020: EF 55-60%, mod MAC, mod AoV sclerosis, triv AR, mild TR, mod Tyler, RVSP 50-59; b.) TTE 09/10/2020: EF 55-60%, mod MAC, mod AoV sclerosis, mild TR, 3+ Tyler, RVSP 50-59; c.) TTE 09/26/2020: EF 55-60%, mild LA dil, triv PR, mild Tyler/TR, RVSP 37-49   Adenoma of left adrenal gland    Anemia    Arthritis    Atrial fibrillation and flutter (HCC)    a.) CHA2DS2VASc = 5 (age x2, HFpEF, HTN, T2DM);  b.) s/p CTI ablation 09/07/2020; c.) rate/rhythm maintained on oral carvedilol; not on chronic anticoagulation therapy   CAD (coronary artery disease)    Cardiomegaly    CKD (chronic kidney disease), stage III (HCC)    DOE (dyspnea on exertion)    Drug-induced bradycardia    Gangrene of toe of left foot (HCC)    a.) 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   Hepatosplenomegaly    History of bilateral  cataract extraction    HLD (hyperlipidemia)    Hypertension    Long term current use of aspirin    Lymphedema of both lower extremities    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)    a.) s/p amputation 11/04/2022   Peripheral vascular disease (HCC)    Pleural effusion on right 09/09/2020   a.) s/p RIGHT thoracentesis with 2180 cc yield   Pneumonia    Presence of permanent cardiac pacemaker 09/10/2020   a.) TVP placement 09/10/2020 due to intermittent CHB in setting of urosepsis; b.) s/p PPM placement 09/15/2020: MDT Azure XT DR (SN: [id number])   Pulmonary hypertension (HCC) 03/01/2020   a.) TTE: 03/01/2020: RVSP 50-59; b.) TTE 09/10/2020: RVSP 50-59; c.) TTE 09/26/2020: RVSP 37-49   RA (rheumatoid arthritis) (HCC)    Rheumatic fever    Sepsis (HCC) 09/10/2020   a.) urosepsis --> BC x 2 sets and UC all grew out significant Proteus mirabilis; admitted to [facility] 09/07/2020 - 09/27/2020.    Sick sinus syndrome [facility])    a.) s/p MDT PPM placement 09/15/2020   T2DM (type 2 diabetes mellitus) (HCC)    Urinary retention    chronic, with indwelling Foley catheter and plans for a suprapubic   Wears dentures    full upper    Current Outpatient Medications  Medication Sig Dispense Refill   acetaminophen (TYLENOL) 500 MG tablet Take 2 tablets (1,000 mg total) by mouth 3 (three) times daily as needed (for pain).     aspirin EC 81 MG tablet Take 1 tablet (81 mg total) by mouth daily. Swallow whole. 30 tablet 0   atorvastatin (LIPITOR) 10 MG tablet Take 1 tablet (10 mg total) by mouth daily at 6 PM. 30 tablet 11   bumetanide (BUMEX) 2 MG tablet Take 1 tablet (2 mg total) by mouth daily. 90 tablet 3   clopidogrel (PLAVIX) 75 MG tablet Take 1 tablet (75 mg total) by mouth daily with breakfast. 30 tablet 11   Finerenone (KERENDIA) 10 MG TABS Take 1 tablet (10 mg total) by mouth daily. 30 tablet 2   gentamicin cream (GARAMYCIN) 0.1 % Apply 1 Application topically 2 (two) times daily. 30 g 1   glipiZIDE (GLUCOTROL) 10 MG tablet Take 1 tablet (10 mg total) by mouth 2 (two) times daily. 60 tablet 2   losartan (COZAAR) 25 MG tablet Take 1 tablet (25 mg total) by mouth daily. 30 tablet 11   metFORMIN (GLUCOPHAGE) 1000 MG tablet Take 1 tablet (1,000 mg total) by mouth 2 (two) times daily. 60 tablet 2   metolazone (ZAROXOLYN) 2.5 MG tablet Take 1 tablet (2.5 mg total) by mouth 2 (two) times a week. On Mondays & Fridays (Patient not taking: Reported on 10/15/2023)     metoprolol succinate (TOPROL-XL) 50 MG 24 hr tablet Take 1 tablet (50 mg total) by mouth daily. Take with or immediately following a meal. 30 tablet 2   Multiple Vitamins-Minerals (MULTIVITAMIN WITH MINERALS) tablet Take 1 tablet by mouth daily.     sacubitril-valsartan (ENTRESTO) 24-26 MG Take 1 tablet by mouth 2 (two) times daily.     silver sulfADIAZINE (SILVADENE) 1 % cream Apply to affected area daily 50 g 1   spironolactone  (ALDACTONE) 25 MG tablet Take 25 mg by mouth daily.     No current facility-administered medications for this visit.   Vitals:   10/28/23 1033  BP: (!) 140/71  Pulse: 88  SpO2: 100%  Weight: 240 lb (108.9 kg)  Wt Readings from Last 3 Encounters:  10/28/23 240 lb (108.9 kg)  10/02/23 241 lb (109.3 kg)  09/21/23 234 lb 12.6 oz (106.5 kg)   Lab Results  Component Value Date   CREATININE 1.37 (H) 09/21/2023   CREATININE 1.19 09/20/2023   CREATININE 1.25 (H) 09/19/2023   PHYSICAL EXAM:  General: Well appearing. No resp difficulty HEENT: normal Neck: supple, no JVD Cor: Regular rhythm, rate. No rubs, gallops or murmurs Lungs: clear Abdomin: soft, nontender, nondistended. Extremities: no cyanosis, clubbing, rash, edema; lower legs currently wrapped. + bruising on left hand Neuro: alert & oriented X 3. Moves all 4 extremities w/o difficulty. Affect pleasant   ECG: not done   ASSESSMENT & PLAN:  1: NICM with reduced ejection fraction- - suspect due to HTN/ atrial fibrillation - NYHA class II - euvolemic - weighing daily; reminded to call for overnight weight gain of > 2 pounds or a weekly weight gain of > 5 pounds - weight down 1 pound from last visit here 1 month ago - Echo 09/20/20: EF 55-60% with 3+ TR - Echo 07/03/23: EF 30-35% with moderate Tyler - continue bumex 2mg  daily - continue kerendia 10mg  daily  - he stopped losartan on his own and is not taking entresto; says that he feels better and doesn't want to resume - continue metoprolol succinate 50mg  daily - continue spironolactone 25mg  daily - not a candidate for SGLT2 due to chronic foley - saw HF provider (Alluri) 01/25 but says that he doesn't intend to return; emphasized keeping this appointment as he needs to be followed by a MD in addition to myself (if he chooses to not return, consider another cardiology group) - to have PET myocardial perfusion test 11/14/23 - BNP 09/11/23 was 3874.2 - BNP Today  2:  HTN- - BP 140/71 - saw PCP (McClanahan) 11/23 - BMP 09/21/23 reviewed and showed sodium 135, potassium 3.7, creatinine 1.37 & GFR 52 - BMET today  3: Atrial fibrillation- - [facility]; emphasized keeping HF MD appt; if he doesn't show, will need to find another one for him to see - ablation 09/07/20  - continue ASA 81mg  - eliquis caused dizziness & vision changes  4: DM (followed by PCP)- - A1c 07/02/23 was 7.2% - continue glipizide 10mg  BID - continue metformin 1000mg  BID  5: Mobitz type 2 AV block- - DDD MDT PPM placed 09/15/20  - <0.1% time in AT/AF - atrial impedance 437 ohms - RV impedance 532 ohms - needs to still get set up with pacemaker clinic; if he's not going to return to [facility], schedule him with [facility]  6: Lymphedema (followed by podiatry)- - stage 2 - both lower legs wrapped but he says that he's hoping to get the wraps removed soon   Return in 1 month, sooner if needed.

## 2023-10-29 ENCOUNTER — Ambulatory Visit (INDEPENDENT_AMBULATORY_CARE_PROVIDER_SITE_OTHER): Payer: Medicare Other | Admitting: Podiatry

## 2023-10-29 DIAGNOSIS — R600 Localized edema: Secondary | ICD-10-CM | POA: Diagnosis not present

## 2023-10-29 DIAGNOSIS — L03116 Cellulitis of left lower limb: Secondary | ICD-10-CM

## 2023-10-29 DIAGNOSIS — L97522 Non-pressure chronic ulcer of other part of left foot with fat layer exposed: Secondary | ICD-10-CM

## 2023-10-29 LAB — BASIC METABOLIC PANEL
BUN/Creatinine Ratio: 28 — ABNORMAL HIGH (ref 10–24)
BUN: 38 mg/dL — ABNORMAL HIGH (ref 8–27)
CO2: 21 mmol/L (ref 20–29)
Calcium: 9.7 mg/dL (ref 8.6–10.2)
Chloride: 103 mmol/L (ref 96–106)
Creatinine, Ser: 1.36 mg/dL — ABNORMAL HIGH (ref 0.76–1.27)
Glucose: 118 mg/dL — ABNORMAL HIGH (ref 70–99)
Potassium: 5 mmol/L (ref 3.5–5.2)
Sodium: 139 mmol/L (ref 134–144)
eGFR: 53 mL/min/{1.73_m2} — ABNORMAL LOW (ref >59)

## 2023-10-29 LAB — BRAIN NATRIURETIC PEPTIDE: BNP: 754.7 pg/mL — ABNORMAL HIGH (ref 0.0–100.0)

## 2023-10-29 MED ORDER — DOXYCYCLINE HYCLATE 100 MG PO TABS: 100.0000 mg | ORAL_TABLET | Freq: Two times a day (BID) | ORAL | 0 refills | Status: DC

## 2023-10-29 NOTE — Progress Notes (Signed)
 No chief complaint on file.   Subjective:  Patient PMHx T2DM, PVD presents today status post RT great toe amputation with left foot wound debridement and application of wound graft (Acell Micromatrix powder w/ wound matrix)  performed inpatient at [facility].  DOS: 09/15/2023.  Subsequently discharged 09/21/2023.    Patient tolerated the multilayer Unna boot compression wraps well throughout the week.  No new complaints  Lab Results  Component Value Date   HGBA1C 7.2 (H) 07/02/2023   HGBA1C 6.4 (H) 10/30/2022   HGBA1C 5.5 06/28/2020    Past Medical History:  Diagnosis Date   (HFpEF) heart failure with preserved ejection fraction (HCC) 03/01/2020   a.) TTE 03/01/2020: EF 55-60%, mod MAC, mod AoV sclerosis, triv AR, mild TR, mod MR, RVSP 50-59; b.) TTE 09/10/2020: EF 55-60%, mod MAC, mod AoV sclerosis, mild TR, 3+ MR, RVSP 50-59; c.) TTE 09/26/2020: EF 55-60%, mild LA dil, triv PR, mild MR/TR, RVSP 37-49   Adenoma of left adrenal gland    Anemia    Arthritis    Atrial fibrillation and flutter (HCC)    a.) CHA2DS2VASc = 5 (age x2, HFpEF, HTN, T2DM);  b.) s/p CTI ablation 09/07/2020; c.) rate/rhythm maintained on oral carvedilol ; not on chronic anticoagulation therapy   CAD (coronary artery disease)    Cardiomegaly    CKD (chronic kidney disease), stage III (HCC)    DOE (dyspnea on exertion)    Drug-induced bradycardia    Gangrene of toe of left foot (HCC)    a.) 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    HLD (hyperlipidemia)    Hypertension    Long term current use of aspirin     Lymphedema of both lower extremities    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)    a.) s/p amputation 11/04/2022   Peripheral vascular disease (HCC)    Pleural effusion on right 09/09/2020   a.) s/p RIGHT thoracentesis with 2180 cc yield   Pneumonia    Presence of permanent cardiac pacemaker 09/10/2020   a.) TVP  placement 09/10/2020 due to intermittent CHB in setting of urosepsis; b.) s/p PPM placement 09/15/2020: MDT Azure XT DR (SN: [id number])   Pulmonary hypertension (HCC) 03/01/2020   a.) TTE: 03/01/2020: RVSP 50-59; b.) TTE 09/10/2020: RVSP 50-59; c.) TTE 09/26/2020: RVSP 37-49   RA (rheumatoid arthritis) (HCC)    Rheumatic fever    Sepsis (HCC) 09/10/2020   a.) urosepsis --> BC x 2 sets and UC all grew out significant Proteus mirabilis; admitted to [facility] 09/07/2020 - 09/27/2020.   Sick sinus syndrome [facility])    a.) s/p MDT PPM placement 09/15/2020   T2DM (type 2 diabetes mellitus) (HCC)    Urinary retention    chronic, with indwelling Foley catheter and plans for a suprapubic   Wears dentures    full upper    Past Surgical History:  Procedure Laterality Date   AMPUTATION TOE Right 11/04/2022   Procedure: AMPUTATION TOE;  Surgeon: Janit Thresa HERO, [name];  [name];  Service: Podiatry;  Laterality: Right;   AMPUTATION TOE Right 09/15/2023   Procedure: AMPUTATION TOE;  Surgeon: Joya Stabs, [name];  [name];  Service: Orthopedics/Podiatry;  Laterality: Right;  Right hallux amputation   BONE BIOPSY Right 04/05/2023   Procedure: BONE BIOPSY THIRD & FOURTH;  Surgeon: Janit Thresa HERO, [name];  [name];  Service: Podiatry;  Laterality: Right;  CARDIAC ELECTROPHYSIOLOGY STUDY AND ABLATION N/A 09/07/2020   Procedure: CARDIAC EP STUDY AND ABLATION (CTI)   CATARACT EXTRACTION W/PHACO Right 01/16/2017   Procedure: CATARACT EXTRACTION PHACO AND INTRAOCULAR LENS PLACEMENT (IOC)  Right Complicated;  Surgeon: Dene Etienne, MD;  Location: [facility];  Service: Ophthalmology;  Laterality: Right;  IVA Block Healon 5 malyugin vision blue Diabetic - oral meds   CATARACT EXTRACTION W/PHACO Left 10/23/2022   Procedure: CATARACT EXTRACTION PHACO AND INTRAOCULAR LENS PLACEMENT (IOC) LEFT DIABETIC;  Surgeon: Jaye Fallow, MD;  Location: [facility];  Service: Ophthalmology;  Laterality: Left;  Diabetic   COLONOSCOPY     INCISION AND DRAINAGE OF WOUND Left 09/15/2023   Procedure: IRRIGATION AND DEBRIDEMENT WOUND;  Surgeon: Joya Stabs, [name];  [name];  Service: Orthopedics/Podiatry;  Laterality: Left;  Left foot wound debridement, possible graft/biopsy   LOWER EXTREMITY ANGIOGRAPHY Right 11/02/2022   Procedure: Lower Extremity Angiography;  Surgeon: Marea Selinda RAMAN, MD;  Location: [facility] INV[facility];  Service: Cardiovascular;  Laterality: Right;   LOWER EXTREMITY ANGIOGRAPHY Right 09/13/2023   Procedure: Lower Extremity Angiography;  Surgeon: Jama Cordella MATSU, MD;  Location: [facility] IN[facility] LAB;  Service: Cardiovascular;  Laterality: Right;   METATARSAL HEAD EXCISION Left 07/05/2018   Procedure: RESECTION FIRST METATARSAL INFECTED BONE AND SOFT TISSUE;  Surgeon: Lilli Cough, [name];  [name];  Service: Podiatry;  Laterality: Left;   METATARSAL HEAD EXCISION Right 04/05/2023   Procedure: METATARSAL HEAD EXCISION THIRD & FOURTH;  Surgeon: Janit Thresa HERO, [name];  [name];  Service: Podiatry;  Laterality: Right;   PACEMAKER INSERTION  09/15/2020   TOE AMPUTATION Left    TONSILLECTOMY      Allergies  Allergen Reactions   Eliquis  [Apixaban ]     Dizziness  and vision change   Spironolactone  Other (See Comments)    dizziness    RT foot 10/04/2023  LT foot 10/04/2023  Objective/Physical Exam Neurovascular status intact.  Skin is warm to touch.  Incisions intact.  Moderate edema with mild localized erythema.  Scant serous drainage  Ulcer noted to the plantar aspect of the fifth MTP of the right foot measuring approximately 1.5 x 1.5 x 0.2 cm.  Again, granular wound base.  No maceration.  No exposed bone muscle tendon ligament or joint.  Periwound is callused  Mostly unchanged.  Ulcer noted plantar aspect of the left foot measuring approximately 2.0 x 2.0 x 0.2 cm.  Granular wound base.  There is  some increased serous drainage noted to the left foot today.  No purulence.  No malodor.  LOWER EXTREMITY ANGIOGRAPHY 09/13/2023 Findings:  The right common femoral is widely patent as is the profunda femoris.  The SFA has a pre-existing stent which is patent.  There is moderate 40 to 50% stenosis at the leading edge of the existing stent proximally.  Distally there is a greater than 70% stenosis at the distal end of the stent and then there are diffuse lesions some of which are 80% particularly the 1 at the level of the femoral condyles in the mid popliteal.  The distal popliteal demonstrates disease but no hemodynamically significant stenosis and the trifurcation is heavily diseased with occlusion of the tibioperoneal trunk, peroneal and posterior tibial which demonstrates occlusion throughout its course.  The anterior tibial is patent from its origin down to the foot which fills the dorsalis pedis and the pedal arch.   Following angioplasty the SFA and proximal popliteal demonstrate significant residual stenosis  as described above.  Therefore a life stent is placed and postdilated with a 6 mm Lutonix balloon.  There is 1 focal area in the mid popliteal as described above which had significant residual stenosis in this area was treated with a 7 mm balloon inflation.  Following this there is wide patency of the entire SFA and popliteal with less than 10% residual stenosis throughout.  The anterior tibial is the single-vessel runoff is preserved all the way down to the foot..  Radiographic Exam RT foot 09/27/2023:  Unchanged from prior x-rays 09/15/2023.  Interval great toe amputation at the level of the MTP.  Prior third toe amputation at the mid metatarsal level with interval resection of the fourth metatarsal head as well.  No acute fracture.  No acute erosions concerning for residual osteomyelitis.  MR FOOT LEFT WO CONTRAST 09/12/2023 IMPRESSION: 1. Skin ulceration along the medial plantar aspect of  the midfoot near the base of the medial cuneiform. No evidence of osteomyelitis or abscess.  MR FOOT RIGHT WO CONTRAST 09/12/2023 IMPRESSION: 1. Deep ulceration along the medial plantar aspect of the great toe with sinus tract extending to the IP joint. Associated septic arthritis and osteomyelitis of the first proximal and distal phalanges. No abscess.  Assessment: 1. s/p right great toe amputation. LT foot wound debridement w/ application of Acell. DOS: 09/15/2023 inpatient at [facility]. Dr. Sikora 2.  Peripheral vascular disease 3.  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  Chronic lower extremity edema bilateral   Plan of Care:  -Patient was evaluated.   -Medically necessary excisional debridement including subcutaneous tissue was performed today to the left foot wound.  Excisional debridement of the necrotic nonviable tissue down to healthier bleeding viable tissue was performed with postdebridement measurement same as pre- -Cultures were taken of the serous drainage to the plantar aspect of the left foot and sent to pathology for culture and sensitivity -Refill prescription for doxycycline  100 mg BID #20 -reapplication of multilayer Unna boot compression wraps to the bilateral lower extremities due to the chronic edema to the bilateral legs.  Prefer that he leaves the dressings on for an entire week if he can tolerate.  Patient is amenable to this plan -Continue minimal WBAT surgical shoe -Return to clinic 1 week   Thresa EMERSON Sar, [name] [facility]  Dr. Thresa EMERSON Sar, [name]    [address] [place], [place] [address]                Office [phone]  Fax [phone]

## 2023-10-29 NOTE — Addendum Note (Signed)
Addended by: Enedina Finner on: 10/29/2023 01:30 PM   Modules accepted: Orders

## 2023-10-31 ENCOUNTER — Other Ambulatory Visit: Payer: Self-pay

## 2023-10-31 ENCOUNTER — Telehealth: Payer: Self-pay | Admitting: Family

## 2023-10-31 MED ORDER — KERENDIA 10 MG PO TABS: 10.0000 mg | ORAL_TABLET | Freq: Every day | ORAL | 5 refills | Status: DC

## 2023-11-01 ENCOUNTER — Other Ambulatory Visit: Payer: Self-pay

## 2023-11-01 MED ORDER — KERENDIA 10 MG PO TABS: 10.0000 mg | ORAL_TABLET | Freq: Every day | ORAL | 5 refills | Status: DC

## 2023-11-01 NOTE — Telephone Encounter (Signed)
 Refill sent.

## 2023-11-02 LAB — WOUND CULTURE

## 2023-11-05 ENCOUNTER — Ambulatory Visit (INDEPENDENT_AMBULATORY_CARE_PROVIDER_SITE_OTHER): Payer: BLUE CROSS/BLUE SHIELD | Admitting: Podiatry

## 2023-11-05 ENCOUNTER — Encounter: Payer: Self-pay | Admitting: Podiatry

## 2023-11-05 DIAGNOSIS — L97522 Non-pressure chronic ulcer of other part of left foot with fat layer exposed: Secondary | ICD-10-CM | POA: Diagnosis not present

## 2023-11-05 DIAGNOSIS — L03116 Cellulitis of left lower limb: Secondary | ICD-10-CM

## 2023-11-05 DIAGNOSIS — R600 Localized edema: Secondary | ICD-10-CM | POA: Diagnosis not present

## 2023-11-05 MED ORDER — LEVOFLOXACIN 750 MG PO TABS: 750.0000 mg | ORAL_TABLET | Freq: Every day | ORAL | 0 refills | Status: DC

## 2023-11-05 MED ORDER — SULFAMETHOXAZOLE-TRIMETHOPRIM 800-160 MG PO TABS: 1.0000 | ORAL_TABLET | Freq: Two times a day (BID) | ORAL | 0 refills | Status: DC

## 2023-11-05 NOTE — Progress Notes (Signed)
 Chief Complaint  Patient presents with   Routine Post Op    POV DOS 09/15/2023 post RT great toe amputation with left foot wound debridement and application of wound graft (Acell Micromatrix powder w/ wound matrix) "I think they're doing better."    Subjective:  Patient PMHx T2DM, PVD presents today status post RT great toe amputation with left foot wound debridement and application of wound graft (Acell Micromatrix powder w/ wound matrix)  performed inpatient at [facility].  DOS: 09/15/2023.  Subsequently discharged 09/21/2023.    Patient tolerated the multilayer Unna boot compression wraps well throughout the week.  No new complaints  Lab Results  Component Value Date   HGBA1C 7.2 (H) 07/02/2023   HGBA1C 6.4 (H) 10/30/2022   HGBA1C 5.5 06/28/2020    Past Medical History:  Diagnosis Date   (HFpEF) heart failure with preserved ejection fraction (HCC) 03/01/2020   a.) TTE 03/01/2020: EF 55-60%, mod MAC, mod AoV sclerosis, triv AR, mild TR, mod MR, RVSP 50-59; b.) TTE 09/10/2020: EF 55-60%, mod MAC, mod AoV sclerosis, mild TR, 3+ MR, RVSP 50-59; c.) TTE 09/26/2020: EF 55-60%, mild LA dil, triv PR, mild MR/TR, RVSP 37-49   Adenoma of left adrenal gland    Anemia    Arthritis    Atrial fibrillation and flutter (HCC)    a.) CHA2DS2VASc = 5 (age x2, HFpEF, HTN, T2DM);  b.) s/p CTI ablation 09/07/2020; c.) rate/rhythm maintained on oral carvedilol; not on chronic anticoagulation therapy   CAD (coronary artery disease)    Cardiomegaly    CKD (chronic kidney disease), stage III (HCC)    DOE (dyspnea on exertion)    Drug-induced bradycardia    Gangrene of toe of left foot (HCC)    a.) 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    HLD (hyperlipidemia)    Hypertension    Long term current use of aspirin    Lymphedema of both lower extremities    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)    a.) s/p amputation  11/04/2022   Peripheral vascular disease (HCC)    Pleural effusion on right 09/09/2020   a.) s/p RIGHT thoracentesis with 2180 cc yield   Pneumonia    Presence of permanent cardiac pacemaker 09/10/2020   a.) TVP placement 09/10/2020 due to intermittent CHB in setting of urosepsis; b.) s/p PPM placement 09/15/2020: MDT Azure XT DR (SN: [id number])   Pulmonary hypertension (HCC) 03/01/2020   a.) TTE: 03/01/2020: RVSP 50-59; b.) TTE 09/10/2020: RVSP 50-59; c.) TTE 09/26/2020: RVSP 37-49   RA (rheumatoid arthritis) (HCC)    Rheumatic fever    Sepsis (HCC) 09/10/2020   a.) urosepsis --> BC x 2 sets and UC all grew out significant Proteus mirabilis; admitted to [facility] 09/07/2020 - 09/27/2020.   Sick sinus syndrome [facility])    a.) s/p MDT PPM placement 09/15/2020   T2DM (type 2 diabetes mellitus) (HCC)    Urinary retention    chronic, with indwelling Foley catheter and plans for a suprapubic   Wears dentures    full upper    Past Surgical History:  Procedure Laterality Date   AMPUTATION TOE Right 11/04/2022   Procedure: AMPUTATION TOE;  Surgeon: Felecia Shelling, [name];  [name];  Service: Podiatry;  Laterality: Right;   AMPUTATION TOE Right 09/15/2023   Procedure: AMPUTATION TOE;  Surgeon: Louann Sjogren, [name];  [name];  Service: Orthopedics/Podiatry;  Laterality: Right;  Right hallux amputation   BONE BIOPSY Right 04/05/2023   Procedure: BONE BIOPSY THIRD & FOURTH;  Surgeon: Felecia Shelling, [name];  [name];  Service: Podiatry;  Laterality: Right;   CARDIAC ELECTROPHYSIOLOGY STUDY AND ABLATION N/A 09/07/2020   Procedure: CARDIAC EP STUDY AND ABLATION (CTI)   CATARACT EXTRACTION W/PHACO Right 01/16/2017   Procedure: CATARACT EXTRACTION PHACO AND INTRAOCULAR LENS PLACEMENT (IOC)  Right Complicated;  Surgeon: Lockie Mola, MD;  Location: [facility];  Service: Ophthalmology;  Laterality: Right;  IVA Block Healon 5 malyugin vision  blue Diabetic - oral meds   CATARACT EXTRACTION W/PHACO Left 10/23/2022   Procedure: CATARACT EXTRACTION PHACO AND INTRAOCULAR LENS PLACEMENT (IOC) LEFT DIABETIC;  Surgeon: Galen Manila, MD;  Location: [facility];  Service: Ophthalmology;  Laterality: Left;  Diabetic   COLONOSCOPY     INCISION AND DRAINAGE OF WOUND Left 09/15/2023   Procedure: IRRIGATION AND DEBRIDEMENT WOUND;  Surgeon: Louann Sjogren, [name];  [name];  Service: Orthopedics/Podiatry;  Laterality: Left;  Left foot wound debridement, possible graft/biopsy   LOWER EXTREMITY ANGIOGRAPHY Right 11/02/2022   Procedure: Lower Extremity Angiography;  Surgeon: Annice Needy, MD;  Location: [facility];  Service: Cardiovascular;  Laterality: Right;   LOWER EXTREMITY ANGIOGRAPHY Right 09/13/2023   Procedure: Lower Extremity Angiography;  Surgeon: Renford Dills, MD;  Location: [facility];  Service: Cardiovascular;  Laterality: Right;   METATARSAL HEAD EXCISION Left 07/05/2018   Procedure: RESECTION FIRST METATARSAL INFECTED BONE AND SOFT TISSUE;  Surgeon: Recardo Evangelist, [name];  [name];  Service: Podiatry;  Laterality: Left;   METATARSAL HEAD EXCISION Right 04/05/2023   Procedure: METATARSAL HEAD EXCISION THIRD & FOURTH;  Surgeon: Felecia Shelling, [name];  [name];  Service: Podiatry;  Laterality: Right;   PACEMAKER INSERTION  09/15/2020   TOE AMPUTATION Left    TONSILLECTOMY      Allergies  Allergen Reactions   Eliquis [Apixaban]     Dizziness  and vision change   Spironolactone Other (See Comments)    dizziness    RT foot 10/04/2023  LT foot 10/04/2023   RT foot 11/05/2023  LT foot 11/05/2023  Objective/Physical Exam Neurovascular status intact.  Skin is warm to touch.  Incisions intact.  Moderate edema with mild localized erythema that appears to be minimally improved.  Scant serous drainage  Ulcer noted to the plantar aspect of the fifth MTP of the right foot  measuring approximately 1.5 x 1.5 x 0.2 cm.  Again, granular wound base.  No maceration.  No exposed bone muscle tendon ligament or joint.  Periwound is callused  Mostly unchanged.  Ulcer noted plantar aspect of the left foot measuring approximately 2.0 x 2.0 x 0.2 cm.  Granular wound base.  There is some increased serous drainage noted to the left foot today.  No purulence.  No malodor.  LOWER EXTREMITY ANGIOGRAPHY 09/13/2023 Findings:  The right common femoral is widely patent as is the profunda femoris.  The SFA has a pre-existing stent which is patent.  There is moderate 40 to 50% stenosis at the leading edge of the existing stent proximally.  Distally there is a greater than 70% stenosis at the distal end of the stent and then there are diffuse lesions some of which are 80% particularly the 1 at the level of the femoral condyles in the mid popliteal.  The distal popliteal demonstrates disease but no hemodynamically significant stenosis and the trifurcation is  heavily diseased with occlusion of the tibioperoneal trunk, peroneal and posterior tibial which demonstrates occlusion throughout its course.  The anterior tibial is patent from its origin down to the foot which fills the dorsalis pedis and the pedal arch.   Following angioplasty the SFA and proximal popliteal demonstrate significant residual stenosis as described above.  Therefore a life stent is placed and postdilated with a 6 mm Lutonix balloon.  There is 1 focal area in the mid popliteal as described above which had significant residual stenosis in this area was treated with a 7 mm balloon inflation.  Following this there is wide patency of the entire SFA and popliteal with less than 10% residual stenosis throughout.  The anterior tibial is the single-vessel runoff is preserved all the way down to the foot..  Radiographic Exam RT foot 09/27/2023:  Unchanged from prior x-rays 09/15/2023.  Interval great toe amputation at the level of the MTP.   Prior third toe amputation at the mid metatarsal level with interval resection of the fourth metatarsal head as well.  No acute fracture.  No acute erosions concerning for residual osteomyelitis.  MR FOOT LEFT WO CONTRAST 09/12/2023 IMPRESSION: 1. Skin ulceration along the medial plantar aspect of the midfoot near the base of the medial cuneiform. No evidence of osteomyelitis or abscess.  MR FOOT RIGHT WO CONTRAST 09/12/2023 IMPRESSION: 1. Deep ulceration along the medial plantar aspect of the great toe with sinus tract extending to the IP joint. Associated septic arthritis and osteomyelitis of the first proximal and distal phalanges. No abscess.  Assessment: 1. s/p right great toe amputation. LT foot wound debridement w/ application of Acell. DOS: 09/15/2023 inpatient at [facility]. Dr. Ralene Cork 2.  Peripheral vascular disease 3.  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  Chronic lower extremity edema bilateral 5.  Cellulitis left foot   Plan of Care:  -Patient was evaluated.   -Cultures reviewed.  Prescription for Bactrim DS and Levaquin x 10 days sent to the pharmacy based on cultures and sensitivities -Medically necessary excisional debridement including subcutaneous tissue was performed today to the left foot wound.  Excisional debridement of the necrotic nonviable tissue down to healthier bleeding viable tissue was performed with postdebridement measurement same as pre- -reapplication of multilayer Unna boot compression wraps to the bilateral lower extremities due to the chronic edema to the bilateral legs.  Prefer that he leaves the dressings on for an entire week if he can tolerate.  Patient is amenable to this plan -Continue minimal WBAT surgical shoe -Return to clinic 1 week   Felecia Shelling, [name] [facility]  Dr. Felecia Shelling, [name]    [address] [place], [place] [address]                Office [phone]  Fax  [phone]

## 2023-11-08 ENCOUNTER — Encounter (INDEPENDENT_AMBULATORY_CARE_PROVIDER_SITE_OTHER): Payer: Self-pay

## 2023-11-12 ENCOUNTER — Encounter: Payer: Self-pay | Admitting: Podiatry

## 2023-11-12 ENCOUNTER — Ambulatory Visit (INDEPENDENT_AMBULATORY_CARE_PROVIDER_SITE_OTHER): Payer: BLUE CROSS/BLUE SHIELD | Admitting: Podiatry

## 2023-11-12 DIAGNOSIS — R600 Localized edema: Secondary | ICD-10-CM

## 2023-11-12 DIAGNOSIS — L97522 Non-pressure chronic ulcer of other part of left foot with fat layer exposed: Secondary | ICD-10-CM | POA: Diagnosis not present

## 2023-11-13 ENCOUNTER — Encounter: Payer: Self-pay | Admitting: Emergency Medicine

## 2023-11-13 ENCOUNTER — Emergency Department
Admission: EM | Admit: 2023-11-13 | Discharge: 2023-11-13 | Disposition: A | Discharge status: Home / Self Care | Payer: BLUE CROSS/BLUE SHIELD | Attending: Emergency Medicine | Admitting: Emergency Medicine

## 2023-11-13 ENCOUNTER — Other Ambulatory Visit: Payer: Self-pay

## 2023-11-13 DIAGNOSIS — I130 Hypertensive heart and chronic kidney disease with heart failure and stage 1 through stage 4 chronic kidney disease, or unspecified chronic kidney disease: Secondary | ICD-10-CM | POA: Diagnosis not present

## 2023-11-13 DIAGNOSIS — I2510 Atherosclerotic heart disease of native coronary artery without angina pectoris: Secondary | ICD-10-CM | POA: Diagnosis not present

## 2023-11-13 DIAGNOSIS — T83098A Other mechanical complication of other indwelling urethral catheter, initial encounter: Secondary | ICD-10-CM | POA: Diagnosis not present

## 2023-11-13 DIAGNOSIS — N399 Disorder of urinary system, unspecified: Secondary | ICD-10-CM | POA: Diagnosis not present

## 2023-11-13 DIAGNOSIS — T839XXA Unspecified complication of genitourinary prosthetic device, implant and graft, initial encounter: Secondary | ICD-10-CM

## 2023-11-13 DIAGNOSIS — N189 Chronic kidney disease, unspecified: Secondary | ICD-10-CM | POA: Diagnosis not present

## 2023-11-13 DIAGNOSIS — T83091A Other mechanical complication of indwelling urethral catheter, initial encounter: Secondary | ICD-10-CM | POA: Diagnosis not present

## 2023-11-13 DIAGNOSIS — I509 Heart failure, unspecified: Secondary | ICD-10-CM | POA: Insufficient documentation

## 2023-11-13 NOTE — ED Triage Notes (Signed)
 Pt to ED via POV. Pt states that he has a urinary catheter in place and that it started leaking this morning. Pt requesting it be changed.

## 2023-11-13 NOTE — Progress Notes (Signed)
 Chief Complaint  Patient presents with   Routine Post Op    POV  DOS 09/15/2023  RT great toe amputation with left foot wound debridement and application of wound graft (Acell Micromatrix powder w/ wound matrix)  performed inpatient at [facility].   "I think it's doing better."     Subjective:  Patient PMHx T2DM, PVD presents today status post RT great toe amputation with left foot wound debridement and application of wound graft (Acell Micromatrix powder w/ wound matrix)  performed inpatient at [facility].  DOS: 09/15/2023.  Subsequently discharged 09/21/2023.    Patient tolerated the multilayer Unna boot compression wraps well throughout the week.  No new complaints  Lab Results  Component Value Date   HGBA1C 7.2 (H) 07/02/2023   HGBA1C 6.4 (H) 10/30/2022   HGBA1C 5.5 06/28/2020    Past Medical History:  Diagnosis Date   (HFpEF) heart failure with preserved ejection fraction (HCC) 03/01/2020   a.) TTE 03/01/2020: EF 55-60%, mod MAC, mod AoV sclerosis, triv AR, mild TR, mod MR, RVSP 50-59; b.) TTE 09/10/2020: EF 55-60%, mod MAC, mod AoV sclerosis, mild TR, 3+ MR, RVSP 50-59; c.) TTE 09/26/2020: EF 55-60%, mild LA dil, triv PR, mild MR/TR, RVSP 37-49   Adenoma of left adrenal gland    Anemia    Arthritis    Atrial fibrillation and flutter (HCC)    a.) CHA2DS2VASc = 5 (age x2, HFpEF, HTN, T2DM);  b.) s/p CTI ablation 09/07/2020; c.) rate/rhythm maintained on oral carvedilol; not on chronic anticoagulation therapy   CAD (coronary artery disease)    Cardiomegaly    CKD (chronic kidney disease), stage III (HCC)    DOE (dyspnea on exertion)    Drug-induced bradycardia    Gangrene of toe of left foot (HCC)    a.) 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    HLD (hyperlipidemia)    Hypertension    Long term current use of aspirin    Lymphedema of both lower extremities    Osteomyelitis of third toe of  right foot (HCC)    a.) s/p amputation 11/04/2022   Peripheral vascular disease (HCC)    Pleural effusion on right 09/09/2020   a.) s/p RIGHT thoracentesis with 2180 cc yield   Pneumonia    Presence of permanent cardiac pacemaker 09/10/2020   a.) TVP placement 09/10/2020 due to intermittent CHB in setting of urosepsis; b.) s/p PPM placement 09/15/2020: MDT Azure XT DR (SN: [id number])   Pulmonary hypertension (HCC) 03/01/2020   a.) TTE: 03/01/2020: RVSP 50-59; b.) TTE 09/10/2020: RVSP 50-59; c.) TTE 09/26/2020: RVSP 37-49   RA (rheumatoid arthritis) (HCC)    Rheumatic fever    Sepsis (HCC) 09/10/2020   a.) urosepsis --> BC x 2 sets and UC all grew out significant Proteus mirabilis; admitted to [facility] 09/07/2020 - 09/27/2020.   Sick sinus syndrome [facility])    a.) s/p MDT PPM placement 09/15/2020   T2DM (type 2 diabetes mellitus) (HCC)    Urinary retention    chronic, with indwelling Foley catheter and plans for a suprapubic   Wears dentures    full upper    Past Surgical History:  Procedure Laterality Date   AMPUTATION TOE Right 11/04/2022   Procedure: AMPUTATION TOE;  Surgeon: Felecia Shelling, [name];  [name];  Service: Podiatry;  Laterality: Right;   AMPUTATION TOE Right 09/15/2023   Procedure: AMPUTATION TOE;  Surgeon:  Louann Sjogren, [name];  [name];  Service: Orthopedics/Podiatry;  Laterality: Right;  Right hallux amputation   BONE BIOPSY Right 04/05/2023   Procedure: BONE BIOPSY THIRD & FOURTH;  Surgeon: Felecia Shelling, [name];  [name];  Service: Podiatry;  Laterality: Right;   CARDIAC ELECTROPHYSIOLOGY STUDY AND ABLATION N/A 09/07/2020   Procedure: CARDIAC EP STUDY AND ABLATION (CTI)   CATARACT EXTRACTION W/PHACO Right 01/16/2017   Procedure: CATARACT EXTRACTION PHACO AND INTRAOCULAR LENS PLACEMENT (IOC)  Right Complicated;  Surgeon: Lockie Mola, MD;  Location: [facility];  Service: Ophthalmology;  Laterality: Right;  IVA  Block Healon 5 malyugin vision blue Diabetic - oral meds   CATARACT EXTRACTION W/PHACO Left 10/23/2022   Procedure: CATARACT EXTRACTION PHACO AND INTRAOCULAR LENS PLACEMENT (IOC) LEFT DIABETIC;  Surgeon: Galen Manila, MD;  Location: [facility];  Service: Ophthalmology;  Laterality: Left;  Diabetic   COLONOSCOPY     INCISION AND DRAINAGE OF WOUND Left 09/15/2023   Procedure: IRRIGATION AND DEBRIDEMENT WOUND;  Surgeon: Louann Sjogren, [name];  [name];  Service: Orthopedics/Podiatry;  Laterality: Left;  Left foot wound debridement, possible graft/biopsy   LOWER EXTREMITY ANGIOGRAPHY Right 11/02/2022   Procedure: Lower Extremity Angiography;  Surgeon: Annice Needy, MD;  Location: [facility];  Service: Cardiovascular;  Laterality: Right;   LOWER EXTREMITY ANGIOGRAPHY Right 09/13/2023   Procedure: Lower Extremity Angiography;  Surgeon: Renford Dills, MD;  Location: [facility];  Service: Cardiovascular;  Laterality: Right;   METATARSAL HEAD EXCISION Left 07/05/2018   Procedure: RESECTION FIRST METATARSAL INFECTED BONE AND SOFT TISSUE;  Surgeon: Recardo Evangelist, [name];  [name];  Service: Podiatry;  Laterality: Left;   METATARSAL HEAD EXCISION Right 04/05/2023   Procedure: METATARSAL HEAD EXCISION THIRD & FOURTH;  Surgeon: Felecia Shelling, [name];  [name];  Service: Podiatry;  Laterality: Right;   PACEMAKER INSERTION  09/15/2020   TOE AMPUTATION Left    TONSILLECTOMY      Allergies  Allergen Reactions   Eliquis [Apixaban]     Dizziness  and vision change   Spironolactone Other (See Comments)    dizziness    RT foot 10/04/2023  LT foot 10/04/2023   RT foot 11/05/2023  LT foot 11/05/2023  Objective/Physical Exam Stable.  Neurovascular status intact.  Skin is warm to touch.    Ulcer noted to the plantar aspect of the fifth MTP of the right foot measuring approximately 1.5 x 1.5 x 0.2 cm.  Again, granular wound base.  No  maceration.  No exposed bone muscle tendon ligament or joint.  Periwound is callused  Modest improvement.  Ulcer noted plantar aspect of the left foot measuring approximately 2.0 x 2.0 x 0.2 cm.  Granular wound base.  There is some increased serous drainage noted to the left foot today.  No purulence.  No malodor.  LOWER EXTREMITY ANGIOGRAPHY 09/13/2023 Findings:  The right common femoral is widely patent as is the profunda femoris.  The SFA has a pre-existing stent which is patent.  There is moderate 40 to 50% stenosis at the leading edge of the existing stent proximally.  Distally there is a greater than 70% stenosis at the distal end of the stent and then there are diffuse lesions some of which are 80% particularly the 1 at the level of the femoral condyles in the mid popliteal.  The distal popliteal demonstrates disease but no hemodynamically significant stenosis and the trifurcation is heavily diseased with occlusion of  the tibioperoneal trunk, peroneal and posterior tibial which demonstrates occlusion throughout its course.  The anterior tibial is patent from its origin down to the foot which fills the dorsalis pedis and the pedal arch.   Following angioplasty the SFA and proximal popliteal demonstrate significant residual stenosis as described above.  Therefore a life stent is placed and postdilated with a 6 mm Lutonix balloon.  There is 1 focal area in the mid popliteal as described above which had significant residual stenosis in this area was treated with a 7 mm balloon inflation.  Following this there is wide patency of the entire SFA and popliteal with less than 10% residual stenosis throughout.  The anterior tibial is the single-vessel runoff is preserved all the way down to the foot..  Radiographic Exam RT foot 09/27/2023:  Unchanged from prior x-rays 09/15/2023.  Interval great toe amputation at the level of the MTP.  Prior third toe amputation at the mid metatarsal level with interval  resection of the fourth metatarsal head as well.  No acute fracture.  No acute erosions concerning for residual osteomyelitis.  MR FOOT LEFT WO CONTRAST 09/12/2023 IMPRESSION: 1. Skin ulceration along the medial plantar aspect of the midfoot near the base of the medial cuneiform. No evidence of osteomyelitis or abscess.  MR FOOT RIGHT WO CONTRAST 09/12/2023 IMPRESSION: 1. Deep ulceration along the medial plantar aspect of the great toe with sinus tract extending to the IP joint. Associated septic arthritis and osteomyelitis of the first proximal and distal phalanges. No abscess.  Assessment: 1. s/p right great toe amputation. LT foot wound debridement w/ application of Acell. DOS: 09/15/2023 inpatient at [facility]. Dr. Ralene Cork 2.  Peripheral vascular disease 3.  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  Chronic lower extremity edema bilateral 5.  Cellulitis left foot; resolved   Plan of Care:  -Patient was evaluated.   - Patient has completed the Bactrim DS as well as Levaquin.  Significant improvement -Medically necessary excisional debridement including subcutaneous tissue was performed today to the left foot wound.  Excisional debridement of the necrotic nonviable tissue down to healthier bleeding viable tissue was performed with postdebridement measurement same as pre- -reapplication of multilayer Unna boot compression wraps to the bilateral lower extremities due to the chronic edema to the bilateral legs.  Prefer that he leaves the dressings on for an entire week if he can tolerate.  Patient is amenable to this plan -Continue minimal WBAT surgical shoe -Return to clinic 1 week   Felecia Shelling, [name] [facility]  Dr. Felecia Shelling, [name]    [address] [place], [place] [address]                Office [phone]  Fax [phone]

## 2023-11-13 NOTE — ED Provider Notes (Signed)
   [facility] Provider Note    Event Date/Time   First MD Initiated Contact with Patient 11/13/23 0800     (approximate)  History   Chief Complaint: Needs Cath replaced  HPI  Tyler [name] is a 78 y.o. male with a past medical history of CHF, CAD, CKD, hypertension, hyperlipidemia, presents to the emergency department for a leaking Foley catheter.  According to the patient he has had a Foley catheter for years now due to urologic issues.  He states this particular catheter has been present for a couple months and it is now leaking.  He came for a catheter replacement.  Denies any abdominal pain.  Denies any fever.  Physical Exam   Triage Vital Signs: ED Triage Vitals  Encounter Vitals Group     BP 11/13/23 0739 137/76     Systolic BP Percentile --      Diastolic BP Percentile --      Pulse Rate 11/13/23 0739 (!) 107     Resp 11/13/23 0739 16     Temp 11/13/23 0738 97.7 F (36.5 C)     Temp src --      SpO2 11/13/23 0739 97 %     Weight 11/13/23 0738 245 lb (111.1 kg)     Height 11/13/23 0738 6\' 6"  (1.981 m)     Head Circumference --      Peak Flow --      Pain Score 11/13/23 0738 0     Pain Loc --      Pain Education --      Exclude from Growth Chart --     Most recent vital signs: Vitals:   11/13/23 0738 11/13/23 0739  BP:  137/76  Pulse:  (!) 107  Resp:  16  Temp: 97.7 F (36.5 C)   SpO2:  97%    General: Awake, no distress.  CV:  Good peripheral perfusion.  Resp:  Normal effort.  Abd:  No distention.   ED Results / Procedures / Treatments    MEDICATIONS ORDERED IN ED: Medications - No data to display   IMPRESSION / MDM / ASSESSMENT AND PLAN / ED COURSE  I reviewed the triage vital signs and the nursing notes.  Patient's presentation is most consistent with acute presentation with potential threat to life or bodily function.  Patient presents to the emergency department for a leaking Foley catheter.  He states this  particular catheter has been present for several months but it has begun to leak.  He came requesting a replacement.  States he has had a catheter in place overall times years due to urologic issues.  Denies any abdominal pain denies any fever.  We will attempt to replace the patient's Foley catheter and discharge with outpatient follow-up.  FINAL CLINICAL IMPRESSION(S) / ED DIAGNOSES   Foley catheter replacement   Note:  This document was prepared using Dragon voice recognition software and may include unintentional dictation errors.   Minna Antis, MD 11/13/23 [phone]

## 2023-11-14 ENCOUNTER — Ambulatory Visit: Payer: Self-pay

## 2023-11-19 ENCOUNTER — Encounter: Payer: Self-pay | Admitting: Podiatry

## 2023-11-19 ENCOUNTER — Ambulatory Visit (INDEPENDENT_AMBULATORY_CARE_PROVIDER_SITE_OTHER): Payer: Medicare Other | Admitting: Podiatry

## 2023-11-19 DIAGNOSIS — R600 Localized edema: Secondary | ICD-10-CM | POA: Diagnosis not present

## 2023-11-19 DIAGNOSIS — L97522 Non-pressure chronic ulcer of other part of left foot with fat layer exposed: Secondary | ICD-10-CM | POA: Diagnosis not present

## 2023-11-19 NOTE — Progress Notes (Signed)
 Chief Complaint  Patient presents with   Routine Post Op    POV  DOS 09/15/2023  RT great toe amputation with left foot wound debridement and application of wound graft (Acell Micromatrix powder w/ wound matrix)  performed inpatient at [facility].   "I guess they're alright, nothing has changed."    Subjective:  Patient PMHx T2DM, PVD presents today status post RT great toe amputation with left foot wound debridement and application of wound graft (Acell Micromatrix powder w/ wound matrix)  performed inpatient at [facility].  DOS: 09/15/2023.  Subsequently discharged 09/21/2023.    Patient tolerated the multilayer Unna boot compression wraps well throughout the week.  No new complaints  Lab Results  Component Value Date   HGBA1C 7.2 (H) 07/02/2023   HGBA1C 6.4 (H) 10/30/2022   HGBA1C 5.5 06/28/2020    Past Medical History:  Diagnosis Date   (HFpEF) heart failure with preserved ejection fraction (HCC) 03/01/2020   a.) TTE 03/01/2020: EF 55-60%, mod MAC, mod AoV sclerosis, triv AR, mild TR, mod MR, RVSP 50-59; b.) TTE 09/10/2020: EF 55-60%, mod MAC, mod AoV sclerosis, mild TR, 3+ MR, RVSP 50-59; c.) TTE 09/26/2020: EF 55-60%, mild LA dil, triv PR, mild MR/TR, RVSP 37-49   Adenoma of left adrenal gland    Anemia    Arthritis    Atrial fibrillation and flutter (HCC)    a.) CHA2DS2VASc = 5 (age x2, HFpEF, HTN, T2DM);  b.) s/p CTI ablation 09/07/2020; c.) rate/rhythm maintained on oral carvedilol; not on chronic anticoagulation therapy   CAD (coronary artery disease)    Cardiomegaly    CKD (chronic kidney disease), stage III (HCC)    DOE (dyspnea on exertion)    Drug-induced bradycardia    Gangrene of toe of left foot (HCC)    a.) 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    HLD (hyperlipidemia)    Hypertension    Long term current use of aspirin    Lymphedema of both lower extremities    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)    a.) s/p amputation 11/04/2022   Peripheral vascular disease (HCC)    Pleural effusion on right 09/09/2020   a.) s/p RIGHT thoracentesis with 2180 cc yield   Pneumonia    Presence of permanent cardiac pacemaker 09/10/2020   a.) TVP placement 09/10/2020 due to intermittent CHB in setting of urosepsis; b.) s/p PPM placement 09/15/2020: MDT Azure XT DR (SN: [id number])   Pulmonary hypertension (HCC) 03/01/2020   a.) TTE: 03/01/2020: RVSP 50-59; b.) TTE 09/10/2020: RVSP 50-59; c.) TTE 09/26/2020: RVSP 37-49   RA (rheumatoid arthritis) (HCC)    Rheumatic fever    Sepsis (HCC) 09/10/2020   a.) urosepsis --> BC x 2 sets and UC all grew out significant Proteus mirabilis; admitted to [facility] 09/07/2020 - 09/27/2020.   Sick sinus syndrome [facility])    a.) s/p MDT PPM placement 09/15/2020   T2DM (type 2 diabetes mellitus) (HCC)    Urinary retention    chronic, with indwelling Foley catheter and plans for a suprapubic   Wears dentures    full upper    Past Surgical History:  Procedure Laterality Date   AMPUTATION TOE Right 11/04/2022   Procedure: AMPUTATION TOE;  Surgeon: Felecia Shelling, [name];  [name];  Service: Podiatry;  Laterality: Right;   AMPUTATION TOE Right 09/15/2023   Procedure: AMPUTATION TOE;  Surgeon: Louann Sjogren, [name];  [name];  Service: Orthopedics/Podiatry;  Laterality: Right;  Right hallux amputation   BONE BIOPSY Right 04/05/2023   Procedure: BONE BIOPSY THIRD & FOURTH;  Surgeon: Felecia Shelling, [name];  [name];  Service: Podiatry;  Laterality: Right;   CARDIAC ELECTROPHYSIOLOGY STUDY AND ABLATION N/A 09/07/2020   Procedure: CARDIAC EP STUDY AND ABLATION (CTI)   CATARACT EXTRACTION W/PHACO Right 01/16/2017   Procedure: CATARACT EXTRACTION PHACO AND INTRAOCULAR LENS PLACEMENT (IOC)  Right Complicated;  Surgeon: Lockie Mola, MD;  Location: [facility];  Service: Ophthalmology;  Laterality:  Right;  IVA Block Healon 5 malyugin vision blue Diabetic - oral meds   CATARACT EXTRACTION W/PHACO Left 10/23/2022   Procedure: CATARACT EXTRACTION PHACO AND INTRAOCULAR LENS PLACEMENT (IOC) LEFT DIABETIC;  Surgeon: Galen Manila, MD;  Location: [facility];  Service: Ophthalmology;  Laterality: Left;  Diabetic   COLONOSCOPY     INCISION AND DRAINAGE OF WOUND Left 09/15/2023   Procedure: IRRIGATION AND DEBRIDEMENT WOUND;  Surgeon: Louann Sjogren, [name];  [name];  Service: Orthopedics/Podiatry;  Laterality: Left;  Left foot wound debridement, possible graft/biopsy   LOWER EXTREMITY ANGIOGRAPHY Right 11/02/2022   Procedure: Lower Extremity Angiography;  Surgeon: Annice Needy, MD;  Location: [facility];  Service: Cardiovascular;  Laterality: Right;   LOWER EXTREMITY ANGIOGRAPHY Right 09/13/2023   Procedure: Lower Extremity Angiography;  Surgeon: Renford Dills, MD;  Location: [facility];  Service: Cardiovascular;  Laterality: Right;   METATARSAL HEAD EXCISION Left 07/05/2018   Procedure: RESECTION FIRST METATARSAL INFECTED BONE AND SOFT TISSUE;  Surgeon: Recardo Evangelist, [name];  [name];  Service: Podiatry;  Laterality: Left;   METATARSAL HEAD EXCISION Right 04/05/2023   Procedure: METATARSAL HEAD EXCISION THIRD & FOURTH;  Surgeon: Felecia Shelling, [name];  [name];  Service: Podiatry;  Laterality: Right;   PACEMAKER INSERTION  09/15/2020   TOE AMPUTATION Left    TONSILLECTOMY      Allergies  Allergen Reactions   Eliquis [Apixaban]     Dizziness  and vision change   Spironolactone Other (See Comments)    dizziness    RT foot 10/04/2023  LT foot 10/04/2023   RT foot 11/05/2023  LT foot 11/05/2023  Objective/Physical Exam Stable.  Neurovascular status intact.  Skin is warm to touch.    Ulcer noted to the plantar aspect of the fifth MTP of the right foot measuring approximately 1.5 x 1.5 x 0.2 cm.  Again, granular wound  base.  No maceration.  No exposed bone muscle tendon ligament or joint.  Periwound is callused  Modest improvement.  Ulcer noted plantar aspect of the left foot measuring approximately 2.0 x 2.0 x 0.2 cm.  Granular wound base.  There is some increased serous drainage noted to the left foot today.  No purulence.  No malodor.  LOWER EXTREMITY ANGIOGRAPHY 09/13/2023 Findings:  The right common femoral is widely patent as is the profunda femoris.  The SFA has a pre-existing stent which is patent.  There is moderate 40 to 50% stenosis at the leading edge of the existing stent proximally.  Distally there is a greater than 70% stenosis at the distal end of the stent and then there are diffuse lesions some of which are 80% particularly the 1 at the level of the femoral condyles in the mid popliteal.  The distal popliteal demonstrates disease but no hemodynamically significant stenosis and the trifurcation is heavily diseased with occlusion  of the tibioperoneal trunk, peroneal and posterior tibial which demonstrates occlusion throughout its course.  The anterior tibial is patent from its origin down to the foot which fills the dorsalis pedis and the pedal arch.   Following angioplasty the SFA and proximal popliteal demonstrate significant residual stenosis as described above.  Therefore a life stent is placed and postdilated with a 6 mm Lutonix balloon.  There is 1 focal area in the mid popliteal as described above which had significant residual stenosis in this area was treated with a 7 mm balloon inflation.  Following this there is wide patency of the entire SFA and popliteal with less than 10% residual stenosis throughout.  The anterior tibial is the single-vessel runoff is preserved all the way down to the foot..  Radiographic Exam RT foot 09/27/2023:  Unchanged from prior x-rays 09/15/2023.  Interval great toe amputation at the level of the MTP.  Prior third toe amputation at the mid metatarsal level with  interval resection of the fourth metatarsal head as well.  No acute fracture.  No acute erosions concerning for residual osteomyelitis.  MR FOOT LEFT WO CONTRAST 09/12/2023 IMPRESSION: 1. Skin ulceration along the medial plantar aspect of the midfoot near the base of the medial cuneiform. No evidence of osteomyelitis or abscess.  MR FOOT RIGHT WO CONTRAST 09/12/2023 IMPRESSION: 1. Deep ulceration along the medial plantar aspect of the great toe with sinus tract extending to the IP joint. Associated septic arthritis and osteomyelitis of the first proximal and distal phalanges. No abscess.  Assessment: 1. s/p right great toe amputation. LT foot wound debridement w/ application of Acell. DOS: 09/15/2023 inpatient at [facility]. Dr. Ralene Cork 2.  Peripheral vascular disease 3.  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  Chronic lower extremity edema bilateral 5.  Cellulitis left foot; resolved   Plan of Care:  -Patient was evaluated.   - Patient has completed the Bactrim DS as well as Levaquin.  Significant improvement -Medically necessary excisional debridement including subcutaneous tissue was performed today to the left foot wound.  Excisional debridement of the necrotic nonviable tissue down to healthier bleeding viable tissue was performed with postdebridement measurement same as pre- -reapplication of multilayer Unna boot compression wraps to the bilateral lower extremities due to the chronic edema to the bilateral legs.  Prefer that he leaves the dressings on for an entire week if he can tolerate.  Patient is amenable to this plan -Continue minimal WBAT surgical shoe -Return to clinic 1 week   Felecia Shelling, [name] [facility]  Dr. Felecia Shelling, [name]    [address] [place], [place] [address]                Office [phone]  Fax [phone]

## 2023-11-23 DIAGNOSIS — I495 Sick sinus syndrome: Secondary | ICD-10-CM | POA: Diagnosis not present

## 2023-11-23 DIAGNOSIS — Z950 Presence of cardiac pacemaker: Secondary | ICD-10-CM | POA: Diagnosis not present

## 2023-11-29 ENCOUNTER — Encounter: Payer: Self-pay | Admitting: Podiatry

## 2023-11-29 ENCOUNTER — Ambulatory Visit (INDEPENDENT_AMBULATORY_CARE_PROVIDER_SITE_OTHER): Payer: BLUE CROSS/BLUE SHIELD | Admitting: Podiatry

## 2023-11-29 DIAGNOSIS — R600 Localized edema: Secondary | ICD-10-CM | POA: Diagnosis not present

## 2023-11-29 DIAGNOSIS — L97522 Non-pressure chronic ulcer of other part of left foot with fat layer exposed: Secondary | ICD-10-CM | POA: Diagnosis not present

## 2023-11-29 NOTE — Progress Notes (Signed)
 Chief Complaint  Patient presents with   Routine Post Op    POV  DOS 09/15/2023  right great toe amputation with left foot wound debridement and application of wound graft (Acell Micromatrix powder w/ wound matrix)   "I guess they are looking okay, I can never tell. They aren't hurting me"    Subjective:  Patient PMHx T2DM, PVD presents today status post RT great toe amputation with left foot wound debridement and application of wound graft (Acell Micromatrix powder w/ wound matrix)  performed inpatient at [facility].  DOS: 09/15/2023.  Subsequently discharged 09/21/2023.    Patient tolerated the multilayer Unna boot compression wraps well throughout the week.  No new complaints  Lab Results  Component Value Date   HGBA1C 7.2 (H) 07/02/2023   HGBA1C 6.4 (H) 10/30/2022   HGBA1C 5.5 06/28/2020    Past Medical History:  Diagnosis Date   (HFpEF) heart failure with preserved ejection fraction (HCC) 03/01/2020   a.) TTE 03/01/2020: EF 55-60%, mod MAC, mod AoV sclerosis, triv AR, mild TR, mod MR, RVSP 50-59; b.) TTE 09/10/2020: EF 55-60%, mod MAC, mod AoV sclerosis, mild TR, 3+ MR, RVSP 50-59; c.) TTE 09/26/2020: EF 55-60%, mild LA dil, triv PR, mild MR/TR, RVSP 37-49   Adenoma of left adrenal gland    Anemia    Arthritis    Atrial fibrillation and flutter (HCC)    a.) CHA2DS2VASc = 5 (age x2, HFpEF, HTN, T2DM);  b.) s/p CTI ablation 09/07/2020; c.) rate/rhythm maintained on oral carvedilol; not on chronic anticoagulation therapy   CAD (coronary artery disease)    Cardiomegaly    CKD (chronic kidney disease), stage III (HCC)    DOE (dyspnea on exertion)    Drug-induced bradycardia    Gangrene of toe of left foot (HCC)    a.) 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    HLD (hyperlipidemia)    Hypertension    Long term current use of aspirin    Lymphedema of both lower extremities    Osteomyelitis of third toe  of right foot (HCC)    a.) s/p amputation 11/04/2022   Peripheral vascular disease (HCC)    Pleural effusion on right 09/09/2020   a.) s/p RIGHT thoracentesis with 2180 cc yield   Pneumonia    Presence of permanent cardiac pacemaker 09/10/2020   a.) TVP placement 09/10/2020 due to intermittent CHB in setting of urosepsis; b.) s/p PPM placement 09/15/2020: MDT Azure XT DR (SN: [id number])   Pulmonary hypertension (HCC) 03/01/2020   a.) TTE: 03/01/2020: RVSP 50-59; b.) TTE 09/10/2020: RVSP 50-59; c.) TTE 09/26/2020: RVSP 37-49   RA (rheumatoid arthritis) (HCC)    Rheumatic fever    Sepsis (HCC) 09/10/2020   a.) urosepsis --> BC x 2 sets and UC all grew out significant Proteus mirabilis; admitted to [facility] 09/07/2020 - 09/27/2020.   Sick sinus syndrome [facility])    a.) s/p MDT PPM placement 09/15/2020   T2DM (type 2 diabetes mellitus) (HCC)    Urinary retention    chronic, with indwelling Foley catheter and plans for a suprapubic   Wears dentures    full upper    Past Surgical History:  Procedure Laterality Date   AMPUTATION TOE Right 11/04/2022   Procedure: AMPUTATION TOE;  Surgeon: Felecia Shelling, [name];  [name];  Service: Podiatry;  Laterality: Right;   AMPUTATION TOE Right 09/15/2023   Procedure: AMPUTATION TOE;  Surgeon: Louann Sjogren, [name];  [name];  Service: Orthopedics/Podiatry;  Laterality: Right;  Right hallux amputation   BONE BIOPSY Right 04/05/2023   Procedure: BONE BIOPSY THIRD & FOURTH;  Surgeon: Felecia Shelling, [name];  [name];  Service: Podiatry;  Laterality: Right;   CARDIAC ELECTROPHYSIOLOGY STUDY AND ABLATION N/A 09/07/2020   Procedure: CARDIAC EP STUDY AND ABLATION (CTI)   CATARACT EXTRACTION W/PHACO Right 01/16/2017   Procedure: CATARACT EXTRACTION PHACO AND INTRAOCULAR LENS PLACEMENT (IOC)  Right Complicated;  Surgeon: Lockie Mola, MD;  Location: [facility];  Service: Ophthalmology;  Laterality: Right;   IVA Block Healon 5 malyugin vision blue Diabetic - oral meds   CATARACT EXTRACTION W/PHACO Left 10/23/2022   Procedure: CATARACT EXTRACTION PHACO AND INTRAOCULAR LENS PLACEMENT (IOC) LEFT DIABETIC;  Surgeon: Galen Manila, MD;  Location: [facility];  Service: Ophthalmology;  Laterality: Left;  Diabetic   COLONOSCOPY     INCISION AND DRAINAGE OF WOUND Left 09/15/2023   Procedure: IRRIGATION AND DEBRIDEMENT WOUND;  Surgeon: Louann Sjogren, [name];  [name];  Service: Orthopedics/Podiatry;  Laterality: Left;  Left foot wound debridement, possible graft/biopsy   LOWER EXTREMITY ANGIOGRAPHY Right 11/02/2022   Procedure: Lower Extremity Angiography;  Surgeon: Annice Needy, MD;  Location: [facility] INVASIVE CV LAB;  Service: Cardiovascular;  Laterality: Right;   LOWER EXTREMITY ANGIOGRAPHY Right 09/13/2023   Procedure: Lower Extremity Angiography;  Surgeon: Renford Dills, MD;  Location: [facility] INVASIVE CV LAB;  Service: Cardiovascular;  Laterality: Right;   METATARSAL HEAD EXCISION Left 07/05/2018   Procedure: RESECTION FIRST METATARSAL INFECTED BONE AND SOFT TISSUE;  Surgeon: Recardo Evangelist, [name];  [name];  Service: Podiatry;  Laterality: Left;   METATARSAL HEAD EXCISION Right 04/05/2023   Procedure: METATARSAL HEAD EXCISION THIRD & FOURTH;  Surgeon: Felecia Shelling, [name];  [name];  Service: Podiatry;  Laterality: Right;   PACEMAKER INSERTION  09/15/2020   TOE AMPUTATION Left    TONSILLECTOMY      Allergies  Allergen Reactions   Eliquis [Apixaban]     Dizziness  and vision change   Spironolactone Other (See Comments)    dizziness    RT foot 10/04/2023  LT foot 10/04/2023   RT foot 11/05/2023  LT foot 11/05/2023  Objective/Physical Exam Stable.  Neurovascular status intact.  Skin is warm to touch.    Ulcer noted to the plantar aspect of the fifth MTP of the right foot measuring approximately 1.5 x 1.5 x 0.2 cm.  Again, granular wound base.  No  maceration.  No exposed bone muscle tendon ligament or joint.  Periwound is callused  Modest improvement.  Ulcer noted plantar aspect of the left foot measuring approximately 2.0 x 2.0 x 0.2 cm.  Granular wound base.  There is some increased serous drainage noted to the left foot today.  No purulence.  No malodor.  LOWER EXTREMITY ANGIOGRAPHY 09/13/2023 Findings:  The right common femoral is widely patent as is the profunda femoris.  The SFA has a pre-existing stent which is patent.  There is moderate 40 to 50% stenosis at the leading edge of the existing stent proximally.  Distally there is a greater than 70% stenosis at the distal end of the stent and then there are diffuse lesions some of which are 80% particularly the 1 at the level of the femoral condyles in the mid popliteal.  The distal popliteal demonstrates disease but no hemodynamically significant stenosis and the trifurcation is heavily diseased with occlusion  of the tibioperoneal trunk, peroneal and posterior tibial which demonstrates occlusion throughout its course.  The anterior tibial is patent from its origin down to the foot which fills the dorsalis pedis and the pedal arch.   Following angioplasty the SFA and proximal popliteal demonstrate significant residual stenosis as described above.  Therefore a life stent is placed and postdilated with a 6 mm Lutonix balloon.  There is 1 focal area in the mid popliteal as described above which had significant residual stenosis in this area was treated with a 7 mm balloon inflation.  Following this there is wide patency of the entire SFA and popliteal with less than 10% residual stenosis throughout.  The anterior tibial is the single-vessel runoff is preserved all the way down to the foot..  Radiographic Exam RT foot 09/27/2023:  Unchanged from prior x-rays 09/15/2023.  Interval great toe amputation at the level of the MTP.  Prior third toe amputation at the mid metatarsal level with interval  resection of the fourth metatarsal head as well.  No acute fracture.  No acute erosions concerning for residual osteomyelitis.  MR FOOT LEFT WO CONTRAST 09/12/2023 IMPRESSION: 1. Skin ulceration along the medial plantar aspect of the midfoot near the base of the medial cuneiform. No evidence of osteomyelitis or abscess.  MR FOOT RIGHT WO CONTRAST 09/12/2023 IMPRESSION: 1. Deep ulceration along the medial plantar aspect of the great toe with sinus tract extending to the IP joint. Associated septic arthritis and osteomyelitis of the first proximal and distal phalanges. No abscess.  Assessment: 1. s/p right great toe amputation. LT foot wound debridement w/ application of Acell. DOS: 09/15/2023 inpatient at [facility] Dr. Ralene Cork 2.  Peripheral vascular disease 3.  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  Chronic lower extremity edema bilateral 5.  Cellulitis left foot; resolved   Plan of Care:  -Patient was evaluated.   - Patient has completed the Bactrim DS as well as Levaquin.  Significant improvement -Medically necessary excisional debridement including subcutaneous tissue was performed today to the left foot wound.  Excisional debridement of the necrotic nonviable tissue down to healthier bleeding viable tissue was performed with postdebridement measurement same as pre- -reapplication of multilayer Unna boot compression wraps to the bilateral lower extremities due to the chronic edema to the bilateral legs.  Prefer that he leaves the dressings on for an entire week if he can tolerate.  Patient is amenable to this plan -Continue minimal WBAT surgical shoe -Return to clinic 1 week   Felecia Shelling, [name] [facility]  Dr. Felecia Shelling, [name]    [address] [place], [place] [address]                Office [phone]  Fax [phone]

## 2023-12-03 ENCOUNTER — Telehealth: Payer: Self-pay | Admitting: Family

## 2023-12-03 NOTE — Progress Notes (Unsigned)
 Advanced Heart Failure Clinic Note    PCP: Elder Negus, NP/ Dr. 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   Cardiac history dates back to 06/21 when he was first diagnosed with HF. Had also recently been diagnosed with atrial fibrillation.   Echo 09/20/20: EF 55-60% with 3+ TR  Admitted 10/30/22 due to infection of right third toe. Noted his right third toe turning dark few days ago and complained of pain in the affected area as well as fever. Right foot x-ray showed soft tissue edema overlies the dorsum of the foot. No soft tissue gas or radiopaque foreign body. No radiographic findings of osteomyelitis. IV antibiotics given. ABI abn. Vascular consulted. RLE angiography done. Amputation of R 3rd toe done. Post-op had significant epistaxis L nare requiring insertion rhino rocket packing overnight, holding heparin/ASA. Restarted at discharge. Admitted 07/02/23 due to shortness of breath and worsening leg edema. Chronic diabetic foot ulcers. Found to have cellulitis involving the right lower extremity along with an elevated BNP of 1066. Patient was seen by podiatrist and underwent bilateral lower extremity imaging however they did not show any evidence of underlying osteomyelitis. Cardiology consulted. IV diuresed with transition to oral diuretics.   Echo 07/03/23: EF 30-35% with moderate Tyler  Was in the ED 08/03/23 due to shortness of breath and worsening pedal edema. CXR  with pulmonary edema. No STEMI. Lasix provided. Admitted 08/13/23 due to acute  worsening of his SOB with associated orthopnea and PND. Cardiology consulted. IV lasix given and then transitioned to an increased oral lasix dose. Troponins 100's flat.   Admitted 09/11/23 due to productive cough, worsening SOB, orthopnea, fatigue and 10 pound weight gain. Also notes oozing from wounds on bottom of his feet. Found to have HF exacerbation, pneumonia, infected plantar ulcers on both feel and right foot osteomyelitis. Podiatry consulted. Bilateral foot MRI done which showed septic arthritis of right hallux IPJ. Subsequent amputation of right hallux done. Left foot wound had I & D done. Vascular saw patient and had right lower extremity arteriogram done with subsequent angioplasty and stent placement of right SFA and popliteal arteries.  He presents today for a follow-up HF visit with a chief complaint of head congestion. Has fatigue (improving) and occasional palpitations along with this. Denies shortness of breath, chest pain, abdominal/ pedal edema, cough, dizziness or difficulty sleeping. Still has both lower legs wrapped and is being followed closely by podiatry.    Has seen Dr. Corky Sing in the past but cancelled his last appointment and says that he has no intention of returning there. He occasionally takes furosemide instead of bumex but denies taking them on the same day.   ROS: All systems negative except as listed in HPI, PMH and Problem List.  SH:  Social History   Socioeconomic History   Marital status: Single    Spouse name: Not on file   Number of children: Not on file   Years of education: Not on file   Highest education level: Not on file  Occupational History    Comment: [employer]  Tobacco Use  Smoking status: Never   Smokeless tobacco: Never  Vaping Use   Vaping status: Never Used  Substance and Sexual Activity   Alcohol use: Not Currently    Comment: rarely   Drug use: Never   Sexual activity: Not Currently    Birth control/protection: None   Other Topics Concern   Not on file  Social History Narrative   Lives with roommate   Social Drivers of Health   Financial Resource Strain: Low Risk  (03/20/2023)   Received from [facility], [facility]   Overall Financial Resource Strain (CARDIA)    Difficulty of Paying Living Expenses: Not hard at all  Food Insecurity: No Food Insecurity (09/13/2023)   Hunger Vital Sign    Worried About Running Out of Food in the Last Year: Never true    Ran Out of Food in the Last Year: Never true  Transportation Needs: Patient Declined (09/14/2023)   PRAPARE - Transportation    Lack of Transportation (Medical): Patient declined    Lack of Transportation (Non-Medical): Patient declined  Physical Activity: Not on file  Stress: No Stress Concern Present (09/07/2020)   Received from [facility], [facility]   [employer] of Occupational Health - Occupational Stress Questionnaire    Feeling of Stress : Not at all  Social Connections: Unknown (02/01/2022)   Received from [facility], [facility]   Social Network    Social Network: Not on file  Intimate Partner Violence: Patient Declined (09/14/2023)   Humiliation, Afraid, Rape, and Kick questionnaire    Fear of Current or Ex-Partner: Patient declined    Emotionally Abused: Patient declined    Physically Abused: Patient declined    Sexually Abused: Patient declined    FH:  Family History  Problem Relation Age of Onset   Cancer Niece     Past Medical History:  Diagnosis Date   (HFpEF) heart failure with preserved ejection fraction (HCC) 03/01/2020   a.) TTE 03/01/2020: EF 55-60%, mod MAC, mod AoV sclerosis, triv AR, mild TR, mod Tyler, RVSP 50-59; b.) TTE 09/10/2020: EF 55-60%, mod MAC, mod AoV sclerosis, mild TR, 3+ Tyler, RVSP 50-59; c.) TTE 09/26/2020: EF 55-60%, mild LA dil, triv PR, mild Tyler/TR, RVSP 37-49   Adenoma of left adrenal gland    Anemia    Arthritis    Atrial fibrillation and flutter (HCC)    a.) CHA2DS2VASc =  5 (age x2, HFpEF, HTN, T2DM);  b.) s/p CTI ablation 09/07/2020; c.) rate/rhythm maintained on oral carvedilol; not on chronic anticoagulation therapy   CAD (coronary artery disease)    Cardiomegaly    CKD (chronic kidney disease), stage III (HCC)    DOE (dyspnea on exertion)    Drug-induced bradycardia    Gangrene of toe of left foot (HCC)    a.) 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    HLD (hyperlipidemia)    Hypertension    Long term current use of aspirin    Lymphedema of both lower extremities    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)    a.) s/p amputation 11/04/2022   Peripheral vascular disease (HCC)    Pleural effusion on right 09/09/2020   a.) s/p RIGHT thoracentesis with 2180 cc yield   Pneumonia    Presence of permanent cardiac pacemaker 09/10/2020   a.) TVP placement 09/10/2020 due to intermittent CHB in setting of urosepsis; b.) s/p PPM placement 09/15/2020: MDT Azure XT  DR (SN: [id number])   Pulmonary hypertension (HCC) 03/01/2020   a.) TTE: 03/01/2020: RVSP 50-59; b.) TTE 09/10/2020: RVSP 50-59; c.) TTE 09/26/2020: RVSP 37-49   RA (rheumatoid arthritis) (HCC)    Rheumatic fever    Sepsis (HCC) 09/10/2020   a.) urosepsis --> BC x 2 sets and UC all grew out significant Proteus mirabilis; admitted to [facility] 09/07/2020 - 09/27/2020.   Sick sinus syndrome [facility])    a.) s/p MDT PPM placement 09/15/2020   T2DM (type 2 diabetes mellitus) (HCC)    Urinary retention    chronic, with indwelling Foley catheter and plans for a suprapubic   Wears dentures    full upper    Current Outpatient Medications  Medication Sig Dispense Refill   acetaminophen (TYLENOL) 500 MG tablet Take 2 tablets (1,000 mg total) by mouth 3 (three) times daily as needed (for pain).     aspirin EC 81 MG tablet Take 1 tablet (81 mg total) by mouth daily. Swallow whole. 30 tablet 0   atorvastatin (LIPITOR) 10 MG  tablet Take 1 tablet (10 mg total) by mouth daily at 6 PM. 30 tablet 11   bumetanide (BUMEX) 2 MG tablet Take 1 tablet (2 mg total) by mouth daily. 90 tablet 3   clopidogrel (PLAVIX) 75 MG tablet Take 1 tablet (75 mg total) by mouth daily with breakfast. (Patient not taking: Reported on 11/19/2023) 30 tablet 11   Finerenone (KERENDIA) 10 MG TABS Take 1 tablet (10 mg total) by mouth daily. 30 tablet 5   furosemide (LASIX) 40 MG tablet Take 40 mg by mouth 2 (two) times daily.     gentamicin cream (GARAMYCIN) 0.1 % Apply 1 Application topically 2 (two) times daily. 30 g 1   glipiZIDE (GLUCOTROL) 10 MG tablet Take 1 tablet (10 mg total) by mouth 2 (two) times daily. 60 tablet 2   levofloxacin (LEVAQUIN) 750 MG tablet Take 1 tablet (750 mg total) by mouth daily. 10 tablet 0   losartan (COZAAR) 25 MG tablet Take 1 tablet (25 mg total) by mouth daily. (Patient not taking: Reported on 11/19/2023) 30 tablet 11   metFORMIN (GLUCOPHAGE) 1000 MG tablet Take 1 tablet (1,000 mg total) by mouth 2 (two) times daily. 60 tablet 2   metolazone (ZAROXOLYN) 2.5 MG tablet Take 1 tablet (2.5 mg total) by mouth 2 (two) times a week. On Mondays & Fridays     metoprolol succinate (TOPROL-XL) 50 MG 24 hr tablet Take 1 tablet (50 mg total) by mouth daily. Take with or immediately following a meal. 30 tablet 2   Multiple Vitamins-Minerals (MULTIVITAMIN WITH MINERALS) tablet Take 1 tablet by mouth daily.     silver sulfADIAZINE (SILVADENE) 1 % cream Apply to affected area daily 50 g 1   spironolactone (ALDACTONE) 25 MG tablet Take 25 mg by mouth daily.     sulfamethoxazole-trimethoprim (BACTRIM DS) 800-160 MG tablet Take 1 tablet by mouth 2 (two) times daily. 20 tablet 0   No current facility-administered medications for this visit.   Vitals:   12/04/23 1056  BP: 126/79  Pulse: 75  SpO2: 100%  Weight: 246 lb 12.8 oz (111.9 kg)   Wt Readings from Last 3 Encounters:  12/04/23 246 lb 12.8 oz (111.9 kg)  11/13/23 245 lb  (111.1 kg)  10/28/23 240 lb (108.9 kg)   Lab Results  Component Value Date   CREATININE 1.36 (H) 10/28/2023   CREATININE 1.37 (H) 09/21/2023   CREATININE 1.19 09/20/2023    PHYSICAL  EXAM:  General: Well appearing. No resp difficulty HEENT: normal Neck: supple, no JVD Cor: Regular rhythm, rate. No rubs, gallops or murmurs Lungs: clear Abdomen: soft, nontender, nondistended. Extremities: no cyanosis, clubbing, rash, bilateral lower legs wrapped Neuro: alert & oriented X 3. Moves all 4 extremities w/o difficulty. Affect pleasant  ECG: not done   ASSESSMENT & PLAN:  1: NICM with reduced ejection fraction- - suspect due to HTN/ atrial fibrillation - NYHA class II - euvolemic - weighing daily; reminded to call for overnight weight gain of > 2 pounds or a weekly weight gain of > 5 pounds - weight up 6 pounds from last visit here 1 month ago - Echo 09/20/20: EF 55-60% with 3+ TR - Echo 07/03/23: EF 30-35% with moderate Tyler - continue bumex 2mg  daily although he says that some days, he takes furosemide instead - continue kerendia 10mg  daily  - continue losartan 25mg  daily (at last visit he said he wasn't taking it anymore) - has metolazone 2.5mg  bi-weekly but he says that he hasn't taken that in awhile - at last visit, he was on metoprolol and spironolactone and, today, he says that he's not taking them - great difficulty in getting him on GDMT because he stops/ starts medications based on how he feels - not a candidate for SGLT2 due to chronic foley - saw HF provider (Alluri) 01/25 & he cancelled his last appt and says that he will not return; explained that he needed to see a HF doctor somewhere and he is agreeable to seeing a [facility] provider - PET myocardial perfusion test was to have been done 02/25 but could not get it scheduled before authorization expired; he is not terribly interested in pursuing this - BNP 10/28/23 reviewed and was 754.7  2: HTN- - BP 133/104 initially because  he wouldn't stop talking/ moving; rechecked was 126/79 - saw PCP (McClanahan) 11/23 - BMP 10/28/23 reviewed and showed sodium 139, potassium 5.0, creatinine 1.36 & GFR 53 - BMET today  3: Atrial fibrillation- - [facility]; has not seen EP yet - ablation 09/07/20  - continue ASA 81mg  - continue clopidogrel 75mg  daily - eliquis caused dizziness & vision changes  4: DM (followed by PCP)- - A1c 07/02/23 was 7.2% - continue glipizide 10mg  BID - continue metformin 1000mg  BID  5: Mobitz type 2 AV block- - DDD MDT PPM placed 09/15/20  - <0.1% time in AT/AF - atrial impedance 437 ohms - RV impedance 532 ohms - emphasized, again, that he needs to return to [facility] EP regarding monthly pacemaker checks; doesn't want pacemaker interrogated  6: Lymphedema/ feet ulcers (followed by podiatry)- - stage 2 - both lower legs wrapped by podiatry   Return in 2 months, sooner if needed.   Clarisa Kindred, FNP

## 2023-12-04 ENCOUNTER — Ambulatory Visit: Payer: BC Managed Care – PPO | Attending: Family | Admitting: Family

## 2023-12-04 ENCOUNTER — Encounter: Payer: Self-pay | Admitting: Family

## 2023-12-04 VITALS — BP 126/79 | HR 75 | Wt 246.8 lb

## 2023-12-04 DIAGNOSIS — N183 Chronic kidney disease, stage 3 unspecified: Secondary | ICD-10-CM | POA: Diagnosis not present

## 2023-12-04 DIAGNOSIS — I4819 Other persistent atrial fibrillation: Secondary | ICD-10-CM | POA: Insufficient documentation

## 2023-12-04 DIAGNOSIS — I441 Atrioventricular block, second degree: Secondary | ICD-10-CM | POA: Diagnosis not present

## 2023-12-04 DIAGNOSIS — L97418 Non-pressure chronic ulcer of right heel and midfoot with other specified severity: Secondary | ICD-10-CM | POA: Insufficient documentation

## 2023-12-04 DIAGNOSIS — E11621 Type 2 diabetes mellitus with foot ulcer: Secondary | ICD-10-CM | POA: Insufficient documentation

## 2023-12-04 DIAGNOSIS — E1151 Type 2 diabetes mellitus with diabetic peripheral angiopathy without gangrene: Secondary | ICD-10-CM | POA: Insufficient documentation

## 2023-12-04 DIAGNOSIS — E785 Hyperlipidemia, unspecified: Secondary | ICD-10-CM | POA: Insufficient documentation

## 2023-12-04 DIAGNOSIS — L089 Local infection of the skin and subcutaneous tissue, unspecified: Secondary | ICD-10-CM | POA: Insufficient documentation

## 2023-12-04 DIAGNOSIS — R339 Retention of urine, unspecified: Secondary | ICD-10-CM | POA: Insufficient documentation

## 2023-12-04 DIAGNOSIS — Z951 Presence of aortocoronary bypass graft: Secondary | ICD-10-CM | POA: Insufficient documentation

## 2023-12-04 DIAGNOSIS — I2510 Atherosclerotic heart disease of native coronary artery without angina pectoris: Secondary | ICD-10-CM | POA: Diagnosis not present

## 2023-12-04 DIAGNOSIS — I130 Hypertensive heart and chronic kidney disease with heart failure and stage 1 through stage 4 chronic kidney disease, or unspecified chronic kidney disease: Secondary | ICD-10-CM | POA: Insufficient documentation

## 2023-12-04 DIAGNOSIS — I428 Other cardiomyopathies: Secondary | ICD-10-CM | POA: Insufficient documentation

## 2023-12-04 DIAGNOSIS — E1122 Type 2 diabetes mellitus with diabetic chronic kidney disease: Secondary | ICD-10-CM | POA: Insufficient documentation

## 2023-12-04 DIAGNOSIS — E1169 Type 2 diabetes mellitus with other specified complication: Secondary | ICD-10-CM | POA: Insufficient documentation

## 2023-12-04 DIAGNOSIS — I10 Essential (primary) hypertension: Secondary | ICD-10-CM | POA: Diagnosis not present

## 2023-12-04 DIAGNOSIS — M868X7 Other osteomyelitis, ankle and foot: Secondary | ICD-10-CM | POA: Diagnosis not present

## 2023-12-04 DIAGNOSIS — L97428 Non-pressure chronic ulcer of left heel and midfoot with other specified severity: Secondary | ICD-10-CM | POA: Insufficient documentation

## 2023-12-04 DIAGNOSIS — I890 Lymphedema, not elsewhere classified: Secondary | ICD-10-CM | POA: Diagnosis not present

## 2023-12-04 DIAGNOSIS — Z7984 Long term (current) use of oral hypoglycemic drugs: Secondary | ICD-10-CM | POA: Diagnosis not present

## 2023-12-04 DIAGNOSIS — Z7902 Long term (current) use of antithrombotics/antiplatelets: Secondary | ICD-10-CM | POA: Diagnosis not present

## 2023-12-04 DIAGNOSIS — R0981 Nasal congestion: Secondary | ICD-10-CM | POA: Diagnosis present

## 2023-12-04 DIAGNOSIS — I5022 Chronic systolic (congestive) heart failure: Secondary | ICD-10-CM | POA: Insufficient documentation

## 2023-12-04 DIAGNOSIS — M069 Rheumatoid arthritis, unspecified: Secondary | ICD-10-CM | POA: Diagnosis not present

## 2023-12-04 DIAGNOSIS — D631 Anemia in chronic kidney disease: Secondary | ICD-10-CM | POA: Diagnosis not present

## 2023-12-04 DIAGNOSIS — I5032 Chronic diastolic (congestive) heart failure: Secondary | ICD-10-CM | POA: Diagnosis present

## 2023-12-04 DIAGNOSIS — N1831 Chronic kidney disease, stage 3a: Secondary | ICD-10-CM

## 2023-12-04 NOTE — Patient Instructions (Signed)
 Lab Work:  Go DOWN to LOWER LEVEL (LL) to have your blood work completed inside of [employer] office.  We will only call you if the results are abnormal or if the provider would like to make medication changes.    Follow-Up in: 2 months with Clarisa Kindred, FNP  At the Advanced [facility] Failure Clinic, you and your health needs are our priority. We have a designated team specialized in the treatment of Heart Failure. This Care Team includes your primary Heart Failure Specialized Cardiologist (physician), Advanced Practice Providers (APPs- Physician Assistants and Nurse Practitioners), and Pharmacist who all work together to provide you with the care you need, when you need it.   You may see any of the following providers on your designated Care Team at your next follow up:  Dr. Arvilla Meres Dr. Marca Ancona Dr. Dorthula Nettles Dr. Theresia Bough Clarisa Kindred, FNP Enos Fling, RPH-CPP  Please be sure to bring in all your medications bottles to every appointment.   Need to Contact us:  If you have any questions or concerns before your next appointment please send [place] a message through [place] or call our office at [phone].    TO LEAVE A MESSAGE FOR THE NURSE SELECT OPTION 2, PLEASE LEAVE A MESSAGE INCLUDING: YOUR NAME DATE OF BIRTH CALL BACK NUMBER REASON FOR CALL**this is important as we prioritize the call backs  YOU WILL RECEIVE A CALL BACK THE SAME DAY AS LONG AS YOU CALL BEFORE 4:00 PM

## 2023-12-05 LAB — BASIC METABOLIC PANEL
BUN/Creatinine Ratio: 27 — ABNORMAL HIGH (ref 10–24)
BUN: 37 mg/dL — ABNORMAL HIGH (ref 8–27)
CO2: 17 mmol/L — ABNORMAL LOW (ref 20–29)
Calcium: 9.6 mg/dL (ref 8.6–10.2)
Chloride: 105 mmol/L (ref 96–106)
Creatinine, Ser: 1.36 mg/dL — ABNORMAL HIGH (ref 0.76–1.27)
Glucose: 125 mg/dL — ABNORMAL HIGH (ref 70–99)
Potassium: 5.1 mmol/L (ref 3.5–5.2)
Sodium: 141 mmol/L (ref 134–144)
eGFR: 53 mL/min/{1.73_m2} — ABNORMAL LOW (ref >59)

## 2023-12-13 ENCOUNTER — Ambulatory Visit (INDEPENDENT_AMBULATORY_CARE_PROVIDER_SITE_OTHER): Admitting: Podiatry

## 2023-12-13 ENCOUNTER — Encounter: Payer: Self-pay | Admitting: Podiatry

## 2023-12-13 DIAGNOSIS — L97522 Non-pressure chronic ulcer of other part of left foot with fat layer exposed: Secondary | ICD-10-CM | POA: Diagnosis not present

## 2023-12-13 DIAGNOSIS — R600 Localized edema: Secondary | ICD-10-CM

## 2023-12-13 DIAGNOSIS — M19079 Primary osteoarthritis, unspecified ankle and foot: Secondary | ICD-10-CM | POA: Diagnosis not present

## 2023-12-13 NOTE — Progress Notes (Signed)
 Chief Complaint  Patient presents with   Diabetic Ulcer    "The left one started bleeding this morning."    Subjective:  Patient PMHx T2DM, PVD presents today status post RT great toe amputation with left foot wound debridement and application of wound graft (Acell Micromatrix powder w/ wound matrix)  performed inpatient at [facility].  DOS: 09/15/2023.  Subsequently discharged 09/21/2023.    Patient tolerated the multilayer Unna boot compression wraps well throughout the week.  No new complaints  Lab Results  Component Value Date   HGBA1C 7.2 (H) 07/02/2023   HGBA1C 6.4 (H) 10/30/2022   HGBA1C 5.5 06/28/2020    Past Medical History:  Diagnosis Date   (HFpEF) heart failure with preserved ejection fraction (HCC) 03/01/2020   a.) TTE 03/01/2020: EF 55-60%, mod MAC, mod AoV sclerosis, triv AR, mild TR, mod MR, RVSP 50-59; b.) TTE 09/10/2020: EF 55-60%, mod MAC, mod AoV sclerosis, mild TR, 3+ MR, RVSP 50-59; c.) TTE 09/26/2020: EF 55-60%, mild LA dil, triv PR, mild MR/TR, RVSP 37-49   Adenoma of left adrenal gland    Anemia    Arthritis    Atrial fibrillation and flutter (HCC)    a.) CHA2DS2VASc = 5 (age x2, HFpEF, HTN, T2DM);  b.) s/p CTI ablation 09/07/2020; c.) rate/rhythm maintained on oral carvedilol; not on chronic anticoagulation therapy   CAD (coronary artery disease)    Cardiomegaly    CKD (chronic kidney disease), stage III (HCC)    DOE (dyspnea on exertion)    Drug-induced bradycardia    Gangrene of toe of left foot (HCC)    a.) 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    HLD (hyperlipidemia)    Hypertension    Long term current use of aspirin    Lymphedema of both lower extremities    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)    a.) s/p amputation 11/04/2022   Peripheral vascular disease (HCC)    Pleural effusion on right 09/09/2020   a.) s/p RIGHT thoracentesis with 2180 cc yield    Pneumonia    Presence of permanent cardiac pacemaker 09/10/2020   a.) TVP placement 09/10/2020 due to intermittent CHB in setting of urosepsis; b.) s/p PPM placement 09/15/2020: MDT Azure XT DR (SN: [id number])   Pulmonary hypertension (HCC) 03/01/2020   a.) TTE: 03/01/2020: RVSP 50-59; b.) TTE 09/10/2020: RVSP 50-59; c.) TTE 09/26/2020: RVSP 37-49   RA (rheumatoid arthritis) (HCC)    Rheumatic fever    Sepsis (HCC) 09/10/2020   a.) urosepsis --> BC x 2 sets and UC all grew out significant Proteus mirabilis; admitted to [facility] 09/07/2020 - 09/27/2020.   Sick sinus syndrome [facility])    a.) s/p MDT PPM placement 09/15/2020   T2DM (type 2 diabetes mellitus) (HCC)    Urinary retention    chronic, with indwelling Foley catheter and plans for a suprapubic   Wears dentures    full upper    Past Surgical History:  Procedure Laterality Date   AMPUTATION TOE Right 11/04/2022   Procedure: AMPUTATION TOE;  Surgeon: Felecia Shelling, [name];  [name];  Service: Podiatry;  Laterality: Right;   AMPUTATION TOE Right 09/15/2023   Procedure: AMPUTATION TOE;  Surgeon: Louann Sjogren, [name];  [name];  Service: Orthopedics/Podiatry;  Laterality: Right;  Right hallux amputation   BONE BIOPSY Right 04/05/2023   Procedure: BONE BIOPSY THIRD & FOURTH;  Surgeon:  Felecia Shelling, [name];  [name];  Service: Podiatry;  Laterality: Right;   CARDIAC ELECTROPHYSIOLOGY STUDY AND ABLATION N/A 09/07/2020   Procedure: CARDIAC EP STUDY AND ABLATION (CTI)   CATARACT EXTRACTION W/PHACO Right 01/16/2017   Procedure: CATARACT EXTRACTION PHACO AND INTRAOCULAR LENS PLACEMENT (IOC)  Right Complicated;  Surgeon: Lockie Mola, MD;  Location: [facility];  Service: Ophthalmology;  Laterality: Right;  IVA Block Healon 5 malyugin vision blue Diabetic - oral meds   CATARACT EXTRACTION W/PHACO Left 10/23/2022   Procedure: CATARACT EXTRACTION PHACO AND INTRAOCULAR LENS PLACEMENT (IOC)  LEFT DIABETIC;  Surgeon: Galen Manila, MD;  Location: [facility];  Service: Ophthalmology;  Laterality: Left;  Diabetic   COLONOSCOPY     INCISION AND DRAINAGE OF WOUND Left 09/15/2023   Procedure: IRRIGATION AND DEBRIDEMENT WOUND;  Surgeon: Louann Sjogren, [name];  [name];  Service: Orthopedics/Podiatry;  Laterality: Left;  Left foot wound debridement, possible graft/biopsy   LOWER EXTREMITY ANGIOGRAPHY Right 11/02/2022   Procedure: Lower Extremity Angiography;  Surgeon: Annice Needy, MD;  Location: [facility];  Service: Cardiovascular;  Laterality: Right;   LOWER EXTREMITY ANGIOGRAPHY Right 09/13/2023   Procedure: Lower Extremity Angiography;  Surgeon: Renford Dills, MD;  Location: [facility];  Service: Cardiovascular;  Laterality: Right;   METATARSAL HEAD EXCISION Left 07/05/2018   Procedure: RESECTION FIRST METATARSAL INFECTED BONE AND SOFT TISSUE;  Surgeon: Recardo Evangelist, [name];  [name];  Service: Podiatry;  Laterality: Left;   METATARSAL HEAD EXCISION Right 04/05/2023   Procedure: METATARSAL HEAD EXCISION THIRD & FOURTH;  Surgeon: Felecia Shelling, [name];  [name];  Service: Podiatry;  Laterality: Right;   PACEMAKER INSERTION  09/15/2020   TOE AMPUTATION Left    TONSILLECTOMY      Allergies  Allergen Reactions   Eliquis [Apixaban]     Dizziness  and vision change   Spironolactone Other (See Comments)    dizziness    RT foot 10/04/2023  LT foot 10/04/2023   RT foot 11/05/2023  LT foot 11/05/2023   RT foot 12/13/2023  LT foot 12/13/2023  Objective/Physical Exam Stable.  Neurovascular status intact.  Skin is warm to touch.    Continued improvement.  Ulcer noted to the plantar aspect of the fifth MTP of the right foot measuring approximately 1.5 x 1.5 x 0.2 cm.  Again, granular wound base.  No maceration.  No exposed bone muscle tendon ligament or joint.  Periwound is callused  Improved.  Ulcer noted plantar  aspect of the left foot measuring approximately 1.5 x 1.5 x 0.2 cm.  Granular wound base.  There is some increased serous drainage noted to the left foot today.  No purulence.  No malodor.  LOWER EXTREMITY ANGIOGRAPHY 09/13/2023 Findings:  The right common femoral is widely patent as is the profunda femoris.  The SFA has a pre-existing stent which is patent.  There is moderate 40 to 50% stenosis at the leading edge of the existing stent proximally.  Distally there is a greater than 70% stenosis at the distal end of the stent and then there are diffuse lesions some of which are 80% particularly the 1 at the level of the femoral condyles in the mid popliteal.  The distal popliteal demonstrates disease but no hemodynamically significant stenosis and the trifurcation is heavily diseased with occlusion of the tibioperoneal trunk, peroneal and posterior tibial which demonstrates occlusion throughout its course.  The anterior tibial is patent from its origin  down to the foot which fills the dorsalis pedis and the pedal arch.   Following angioplasty the SFA and proximal popliteal demonstrate significant residual stenosis as described above.  Therefore a life stent is placed and postdilated with a 6 mm Lutonix balloon.  There is 1 focal area in the mid popliteal as described above which had significant residual stenosis in this area was treated with a 7 mm balloon inflation.  Following this there is wide patency of the entire SFA and popliteal with less than 10% residual stenosis throughout.  The anterior tibial is the single-vessel runoff is preserved all the way down to the foot..  Radiographic Exam RT foot 09/27/2023:  Unchanged from prior x-rays 09/15/2023.  Interval great toe amputation at the level of the MTP.  Prior third toe amputation at the mid metatarsal level with interval resection of the fourth metatarsal head as well.  No acute fracture.  No acute erosions concerning for residual osteomyelitis.  MR  FOOT LEFT WO CONTRAST 09/12/2023 IMPRESSION: 1. Skin ulceration along the medial plantar aspect of the midfoot near the base of the medial cuneiform. No evidence of osteomyelitis or abscess.  MR FOOT RIGHT WO CONTRAST 09/12/2023 IMPRESSION: 1. Deep ulceration along the medial plantar aspect of the great toe with sinus tract extending to the IP joint. Associated septic arthritis and osteomyelitis of the first proximal and distal phalanges. No abscess.  Assessment: 1. s/p right great toe amputation. LT foot wound debridement w/ application of Acell. DOS: 09/15/2023 inpatient at [facility]. Dr. Ralene Cork 2.  Peripheral vascular disease 3.  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  Chronic lower extremity edema bilateral 5.  Cellulitis left foot; resolved 6.  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   - Overall continued steady improvement -Medically necessary excisional debridement including subcutaneous tissue was performed today to the left foot wound.  Excisional debridement of the necrotic nonviable tissue down to healthier bleeding viable tissue was performed with postdebridement measurement same as pre- -reapplication of multilayer Unna boot compression wraps to the bilateral lower extremities due to the chronic edema to the bilateral legs.  Prefer that he leaves the dressings on for an entire week if he can tolerate.  Patient is amenable to this plan -Continue to advise against going barefoot.  Patient currently wearing good supportive tennis shoes and sneakers.  Continue -Return to clinic 1 week   Felecia Shelling, [name] [facility]  Dr. Felecia Shelling, [name]    [address] [place], [place] [address]                Office [phone]  Fax [phone]

## 2023-12-20 ENCOUNTER — Ambulatory Visit: Admitting: Podiatry

## 2023-12-20 ENCOUNTER — Encounter: Payer: Self-pay | Admitting: Podiatry

## 2023-12-20 DIAGNOSIS — R600 Localized edema: Secondary | ICD-10-CM | POA: Diagnosis not present

## 2023-12-20 DIAGNOSIS — M19079 Primary osteoarthritis, unspecified ankle and foot: Secondary | ICD-10-CM | POA: Diagnosis not present

## 2023-12-20 DIAGNOSIS — L97522 Non-pressure chronic ulcer of other part of left foot with fat layer exposed: Secondary | ICD-10-CM

## 2023-12-23 NOTE — Progress Notes (Signed)
 Chief Complaint  Patient presents with   Wound Check    Patient is here for foot ulcers, doing better red in color, clean, some callous build up on both feet around ulcers    Subjective:  Patient PMHx T2DM, PVD presents today status post RT great toe amputation with left foot wound debridement and application of wound graft (Acell Micromatrix powder w/ wound matrix)  performed inpatient at [facility].  DOS: 09/15/2023.  Subsequently discharged 09/21/2023.    Patient tolerated the multilayer Unna boot compression wraps well throughout the week.  No new complaints  Lab Results  Component Value Date   HGBA1C 7.2 (H) 07/02/2023   HGBA1C 6.4 (H) 10/30/2022   HGBA1C 5.5 06/28/2020    Past Medical History:  Diagnosis Date   (HFpEF) heart failure with preserved ejection fraction (HCC) 03/01/2020   a.) TTE 03/01/2020: EF 55-60%, mod MAC, mod AoV sclerosis, triv AR, mild TR, mod MR, RVSP 50-59; b.) TTE 09/10/2020: EF 55-60%, mod MAC, mod AoV sclerosis, mild TR, 3+ MR, RVSP 50-59; c.) TTE 09/26/2020: EF 55-60%, mild LA dil, triv PR, mild MR/TR, RVSP 37-49   Adenoma of left adrenal gland    Anemia    Arthritis    Atrial fibrillation and flutter (HCC)    a.) CHA2DS2VASc = 5 (age x2, HFpEF, HTN, T2DM);  b.) s/p CTI ablation 09/07/2020; c.) rate/rhythm maintained on oral carvedilol; not on chronic anticoagulation therapy   CAD (coronary artery disease)    Cardiomegaly    CKD (chronic kidney disease), stage III (HCC)    DOE (dyspnea on exertion)    Drug-induced bradycardia    Gangrene of toe of left foot (HCC)    a.) 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    HLD (hyperlipidemia)    Hypertension    Long term current use of aspirin    Lymphedema of both lower extremities    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)    a.) s/p amputation 11/04/2022   Peripheral vascular disease (HCC)    Pleural effusion on right  09/09/2020   a.) s/p RIGHT thoracentesis with 2180 cc yield   Pneumonia    Presence of permanent cardiac pacemaker 09/10/2020   a.) TVP placement 09/10/2020 due to intermittent CHB in setting of urosepsis; b.) s/p PPM placement 09/15/2020: MDT Azure XT DR (SN: [id number])   Pulmonary hypertension (HCC) 03/01/2020   a.) TTE: 03/01/2020: RVSP 50-59; b.) TTE 09/10/2020: RVSP 50-59; c.) TTE 09/26/2020: RVSP 37-49   RA (rheumatoid arthritis) (HCC)    Rheumatic fever    Sepsis (HCC) 09/10/2020   a.) urosepsis --> BC x 2 sets and UC all grew out significant Proteus mirabilis; admitted to [facility] 09/07/2020 - 09/27/2020.   Sick sinus syndrome [facility])    a.) s/p MDT PPM placement 09/15/2020   T2DM (type 2 diabetes mellitus) (HCC)    Urinary retention    chronic, with indwelling Foley catheter and plans for a suprapubic   Wears dentures    full upper    Past Surgical History:  Procedure Laterality Date   AMPUTATION TOE Right 11/04/2022   Procedure: AMPUTATION TOE;  Surgeon: Felecia Shelling, [name];  [name];  Service: Podiatry;  Laterality: Right;   AMPUTATION TOE Right 09/15/2023   Procedure: AMPUTATION TOE;  Surgeon: Louann Sjogren, [name];  [name];  Service: Orthopedics/Podiatry;  Laterality: Right;  Right hallux amputation  BONE BIOPSY Right 04/05/2023   Procedure: BONE BIOPSY THIRD & FOURTH;  Surgeon: Felecia Shelling, [name];  [name];  Service: Podiatry;  Laterality: Right;   CARDIAC ELECTROPHYSIOLOGY STUDY AND ABLATION N/A 09/07/2020   Procedure: CARDIAC EP STUDY AND ABLATION (CTI)   CATARACT EXTRACTION W/PHACO Right 01/16/2017   Procedure: CATARACT EXTRACTION PHACO AND INTRAOCULAR LENS PLACEMENT (IOC)  Right Complicated;  Surgeon: Lockie Mola, MD;  Location: [facility];  Service: Ophthalmology;  Laterality: Right;  IVA Block Healon 5 malyugin vision blue Diabetic - oral meds   CATARACT EXTRACTION W/PHACO Left 10/23/2022    Procedure: CATARACT EXTRACTION PHACO AND INTRAOCULAR LENS PLACEMENT (IOC) LEFT DIABETIC;  Surgeon: Galen Manila, MD;  Location: [facility];  Service: Ophthalmology;  Laterality: Left;  Diabetic   COLONOSCOPY     INCISION AND DRAINAGE OF WOUND Left 09/15/2023   Procedure: IRRIGATION AND DEBRIDEMENT WOUND;  Surgeon: Louann Sjogren, [name];  [name];  Service: Orthopedics/Podiatry;  Laterality: Left;  Left foot wound debridement, possible graft/biopsy   LOWER EXTREMITY ANGIOGRAPHY Right 11/02/2022   Procedure: Lower Extremity Angiography;  Surgeon: Annice Needy, MD;  Location: [facility];  Service: Cardiovascular;  Laterality: Right;   LOWER EXTREMITY ANGIOGRAPHY Right 09/13/2023   Procedure: Lower Extremity Angiography;  Surgeon: Renford Dills, MD;  Location: [facility];  Service: Cardiovascular;  Laterality: Right;   METATARSAL HEAD EXCISION Left 07/05/2018   Procedure: RESECTION FIRST METATARSAL INFECTED BONE AND SOFT TISSUE;  Surgeon: Recardo Evangelist, [name];  [name];  Service: Podiatry;  Laterality: Left;   METATARSAL HEAD EXCISION Right 04/05/2023   Procedure: METATARSAL HEAD EXCISION THIRD & FOURTH;  Surgeon: Felecia Shelling, [name];  [name];  Service: Podiatry;  Laterality: Right;   PACEMAKER INSERTION  09/15/2020   TOE AMPUTATION Left    TONSILLECTOMY      Allergies  Allergen Reactions   Eliquis [Apixaban]     Dizziness  and vision change   Spironolactone Other (See Comments)    dizziness    RT foot 10/04/2023  LT foot 10/04/2023   RT foot 11/05/2023  LT foot 11/05/2023   RT foot 12/13/2023  LT foot 12/13/2023  Objective/Physical Exam Stable.  Neurovascular status intact.  Skin is warm to touch.    Continued improvement.  Ulcer noted to the plantar aspect of the fifth MTP of the right foot measuring approximately 1.5 x 1.5 x 0.2 cm.  Again, granular wound base.  No maceration.  No exposed bone muscle tendon  ligament or joint.  Periwound is callused  Improved.  Ulcer noted plantar aspect of the left foot measuring approximately 1.5 x 1.5 x 0.2 cm.  Granular wound base.  There is some increased serous drainage noted to the left foot today.  No purulence.  No malodor.  LOWER EXTREMITY ANGIOGRAPHY 09/13/2023 Findings:  The right common femoral is widely patent as is the profunda femoris.  The SFA has a pre-existing stent which is patent.  There is moderate 40 to 50% stenosis at the leading edge of the existing stent proximally.  Distally there is a greater than 70% stenosis at the distal end of the stent and then there are diffuse lesions some of which are 80% particularly the 1 at the level of the femoral condyles in the mid popliteal.  The distal popliteal demonstrates disease but no hemodynamically significant stenosis and the trifurcation is heavily diseased with occlusion of the tibioperoneal trunk, peroneal and posterior tibial which  demonstrates occlusion throughout its course.  The anterior tibial is patent from its origin down to the foot which fills the dorsalis pedis and the pedal arch.   Following angioplasty the SFA and proximal popliteal demonstrate significant residual stenosis as described above.  Therefore a life stent is placed and postdilated with a 6 mm Lutonix balloon.  There is 1 focal area in the mid popliteal as described above which had significant residual stenosis in this area was treated with a 7 mm balloon inflation.  Following this there is wide patency of the entire SFA and popliteal with less than 10% residual stenosis throughout.  The anterior tibial is the single-vessel runoff is preserved all the way down to the foot..  Radiographic Exam RT foot 09/27/2023:  Unchanged from prior x-rays 09/15/2023.  Interval great toe amputation at the level of the MTP.  Prior third toe amputation at the mid metatarsal level with interval resection of the fourth metatarsal head as well.  No acute  fracture.  No acute erosions concerning for residual osteomyelitis.  MR FOOT LEFT WO CONTRAST 09/12/2023 IMPRESSION: 1. Skin ulceration along the medial plantar aspect of the midfoot near the base of the medial cuneiform. No evidence of osteomyelitis or abscess.  MR FOOT RIGHT WO CONTRAST 09/12/2023 IMPRESSION: 1. Deep ulceration along the medial plantar aspect of the great toe with sinus tract extending to the IP joint. Associated septic arthritis and osteomyelitis of the first proximal and distal phalanges. No abscess.  Assessment: 1. s/p right great toe amputation. LT foot wound debridement w/ application of Acell. DOS: 09/15/2023 inpatient at [facility]. Dr. Ralene Cork 2.  Peripheral vascular disease 3.  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  Chronic lower extremity edema bilateral 5.  Cellulitis left foot; resolved 6.  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   - Overall continued steady improvement -Medically necessary excisional debridement including subcutaneous tissue was performed today to the left foot wound.  Excisional debridement of the necrotic nonviable tissue down to healthier bleeding viable tissue was performed with postdebridement measurement same as pre- -reapplication of multilayer Unna boot compression wraps to the bilateral lower extremities due to the chronic edema to the bilateral legs.  Prefer that he leaves the dressings on for an entire week if he can tolerate.  Patient is amenable to this plan -Continue to advise against going barefoot.  Patient currently wearing good supportive tennis shoes and sneakers.  Continue -Return to clinic 1 week   Felecia Shelling, [name] [facility]  Dr. Felecia Shelling, [name]    [address] [place], [place] [address]                Office [phone]  Fax [phone]

## 2023-12-27 ENCOUNTER — Ambulatory Visit (INDEPENDENT_AMBULATORY_CARE_PROVIDER_SITE_OTHER): Admitting: Podiatry

## 2023-12-27 ENCOUNTER — Encounter: Payer: Self-pay | Admitting: Podiatry

## 2023-12-27 DIAGNOSIS — R600 Localized edema: Secondary | ICD-10-CM

## 2023-12-27 DIAGNOSIS — L97522 Non-pressure chronic ulcer of other part of left foot with fat layer exposed: Secondary | ICD-10-CM | POA: Diagnosis not present

## 2023-12-31 ENCOUNTER — Encounter: Payer: Self-pay | Admitting: Podiatry

## 2023-12-31 ENCOUNTER — Ambulatory Visit (INDEPENDENT_AMBULATORY_CARE_PROVIDER_SITE_OTHER): Admitting: Podiatry

## 2023-12-31 DIAGNOSIS — R600 Localized edema: Secondary | ICD-10-CM | POA: Diagnosis not present

## 2023-12-31 DIAGNOSIS — L97522 Non-pressure chronic ulcer of other part of left foot with fat layer exposed: Secondary | ICD-10-CM | POA: Diagnosis not present

## 2023-12-31 DIAGNOSIS — L03116 Cellulitis of left lower limb: Secondary | ICD-10-CM

## 2023-12-31 MED ORDER — DOXYCYCLINE HYCLATE 100 MG PO TABS: 100.0000 mg | ORAL_TABLET | Freq: Two times a day (BID) | ORAL | 0 refills | Status: DC

## 2023-12-31 NOTE — Progress Notes (Signed)
 Chief Complaint  Patient presents with   Diabetic Ulcer    "It started swelling yesterday in my feet and ankles up to my knees.  I thought I better come in."    Subjective:  Patient PMHx T2DM, PVD presents today status post RT great toe amputation with left foot wound debridement and application of wound graft (Acell Micromatrix powder w/ wound matrix)  performed inpatient at [facility].  DOS: 09/15/2023.  Subsequently discharged 09/21/2023.    Patient tolerated the multilayer Unna boot compression wraps well throughout the week.  No new complaints  Lab Results  Component Value Date   HGBA1C 7.2 (H) 07/02/2023   HGBA1C 6.4 (H) 10/30/2022   HGBA1C 5.5 06/28/2020    Past Medical History:  Diagnosis Date   (HFpEF) heart failure with preserved ejection fraction (HCC) 03/01/2020   a.) TTE 03/01/2020: EF 55-60%, mod MAC, mod AoV sclerosis, triv AR, mild TR, mod MR, RVSP 50-59; b.) TTE 09/10/2020: EF 55-60%, mod MAC, mod AoV sclerosis, mild TR, 3+ MR, RVSP 50-59; c.) TTE 09/26/2020: EF 55-60%, mild LA dil, triv PR, mild MR/TR, RVSP 37-49   Adenoma of left adrenal gland    Anemia    Arthritis    Atrial fibrillation and flutter (HCC)    a.) CHA2DS2VASc = 5 (age x2, HFpEF, HTN, T2DM);  b.) s/p CTI ablation 09/07/2020; c.) rate/rhythm maintained on oral carvedilol; not on chronic anticoagulation therapy   CAD (coronary artery disease)    Cardiomegaly    CKD (chronic kidney disease), stage III (HCC)    DOE (dyspnea on exertion)    Drug-induced bradycardia    Gangrene of toe of left foot (HCC)    a.) 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    HLD (hyperlipidemia)    Hypertension    Long term current use of aspirin    Lymphedema of both lower extremities    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)    a.) s/p amputation 11/04/2022   Peripheral vascular disease (HCC)    Pleural effusion on right 09/09/2020    a.) s/p RIGHT thoracentesis with 2180 cc yield   Pneumonia    Presence of permanent cardiac pacemaker 09/10/2020   a.) TVP placement 09/10/2020 due to intermittent CHB in setting of urosepsis; b.) s/p PPM placement 09/15/2020: MDT Azure XT DR (SN: [id number])   Pulmonary hypertension (HCC) 03/01/2020   a.) TTE: 03/01/2020: RVSP 50-59; b.) TTE 09/10/2020: RVSP 50-59; c.) TTE 09/26/2020: RVSP 37-49   RA (rheumatoid arthritis) (HCC)    Rheumatic fever    Sepsis (HCC) 09/10/2020   a.) urosepsis --> BC x 2 sets and UC all grew out significant Proteus mirabilis; admitted to [facility] 09/07/2020 - 09/27/2020.   Sick sinus syndrome [facility])    a.) s/p MDT PPM placement 09/15/2020   T2DM (type 2 diabetes mellitus) (HCC)    Urinary retention    chronic, with indwelling Foley catheter and plans for a suprapubic   Wears dentures    full upper    Past Surgical History:  Procedure Laterality Date   AMPUTATION TOE Right 11/04/2022   Procedure: AMPUTATION TOE;  Surgeon: Felecia Shelling, [name];  [name];  Service: Podiatry;  Laterality: Right;   AMPUTATION TOE Right 09/15/2023   Procedure: AMPUTATION TOE;  Surgeon: Louann Sjogren, [name];  [name];  Service: Orthopedics/Podiatry;  Laterality: Right;  Right hallux amputation   BONE  BIOPSY Right 04/05/2023   Procedure: BONE BIOPSY THIRD & FOURTH;  Surgeon: Felecia Shelling, [name];  [name];  Service: Podiatry;  Laterality: Right;   CARDIAC ELECTROPHYSIOLOGY STUDY AND ABLATION N/A 09/07/2020   Procedure: CARDIAC EP STUDY AND ABLATION (CTI)   CATARACT EXTRACTION W/PHACO Right 01/16/2017   Procedure: CATARACT EXTRACTION PHACO AND INTRAOCULAR LENS PLACEMENT (IOC)  Right Complicated;  Surgeon: Lockie Mola, MD;  Location: [facility];  Service: Ophthalmology;  Laterality: Right;  IVA Block Healon 5 malyugin vision blue Diabetic - oral meds   CATARACT EXTRACTION W/PHACO Left 10/23/2022   Procedure: CATARACT  EXTRACTION PHACO AND INTRAOCULAR LENS PLACEMENT (IOC) LEFT DIABETIC;  Surgeon: Galen Manila, MD;  Location: [facility];  Service: Ophthalmology;  Laterality: Left;  Diabetic   COLONOSCOPY     INCISION AND DRAINAGE OF WOUND Left 09/15/2023   Procedure: IRRIGATION AND DEBRIDEMENT WOUND;  Surgeon: Louann Sjogren, [name];  [name];  Service: Orthopedics/Podiatry;  Laterality: Left;  Left foot wound debridement, possible graft/biopsy   LOWER EXTREMITY ANGIOGRAPHY Right 11/02/2022   Procedure: Lower Extremity Angiography;  Surgeon: Annice Needy, MD;  Location: [facility];  Service: Cardiovascular;  Laterality: Right;   LOWER EXTREMITY ANGIOGRAPHY Right 09/13/2023   Procedure: Lower Extremity Angiography;  Surgeon: Renford Dills, MD;  Location: [facility];  Service: Cardiovascular;  Laterality: Right;   METATARSAL HEAD EXCISION Left 07/05/2018   Procedure: RESECTION FIRST METATARSAL INFECTED BONE AND SOFT TISSUE;  Surgeon: Recardo Evangelist, [name];  [name];  Service: Podiatry;  Laterality: Left;   METATARSAL HEAD EXCISION Right 04/05/2023   Procedure: METATARSAL HEAD EXCISION THIRD & FOURTH;  Surgeon: Felecia Shelling, [name];  [name];  Service: Podiatry;  Laterality: Right;   PACEMAKER INSERTION  09/15/2020   TOE AMPUTATION Left    TONSILLECTOMY      Allergies  Allergen Reactions   Eliquis [Apixaban]     Dizziness  and vision change   Spironolactone Other (See Comments)    dizziness    RT foot 10/04/2023  LT foot 10/04/2023   RT foot 11/05/2023  LT foot 11/05/2023   RT foot 12/13/2023  LT foot 12/13/2023  Objective/Physical Exam Stable.  Neurovascular status intact.  Skin is warm to touch.    Continued improvement.  Ulcer noted to the plantar aspect of the fifth MTP of the right foot measuring approximately 1.5 x 1.5 x 0.2 cm.  Again, granular wound base.  No maceration.  No exposed bone muscle tendon ligament or joint.   Periwound is callused  Improved.  Ulcer noted plantar aspect of the left foot measuring approximately 1.5 x 1.5 x 0.2 cm.  Granular wound base.  There is some increased serous drainage noted to the left foot today.  No purulence.  No malodor.  LOWER EXTREMITY ANGIOGRAPHY 09/13/2023 Findings:  The right common femoral is widely patent as is the profunda femoris.  The SFA has a pre-existing stent which is patent.  There is moderate 40 to 50% stenosis at the leading edge of the existing stent proximally.  Distally there is a greater than 70% stenosis at the distal end of the stent and then there are diffuse lesions some of which are 80% particularly the 1 at the level of the femoral condyles in the mid popliteal.  The distal popliteal demonstrates disease but no hemodynamically significant stenosis and the trifurcation is heavily diseased with occlusion of the tibioperoneal trunk, peroneal and posterior tibial which demonstrates  occlusion throughout its course.  The anterior tibial is patent from its origin down to the foot which fills the dorsalis pedis and the pedal arch.   Following angioplasty the SFA and proximal popliteal demonstrate significant residual stenosis as described above.  Therefore a life stent is placed and postdilated with a 6 mm Lutonix balloon.  There is 1 focal area in the mid popliteal as described above which had significant residual stenosis in this area was treated with a 7 mm balloon inflation.  Following this there is wide patency of the entire SFA and popliteal with less than 10% residual stenosis throughout.  The anterior tibial is the single-vessel runoff is preserved all the way down to the foot..  Radiographic Exam RT foot 09/27/2023:  Unchanged from prior x-rays 09/15/2023.  Interval great toe amputation at the level of the MTP.  Prior third toe amputation at the mid metatarsal level with interval resection of the fourth metatarsal head as well.  No acute fracture.  No acute  erosions concerning for residual osteomyelitis.  MR FOOT LEFT WO CONTRAST 09/12/2023 IMPRESSION: 1. Skin ulceration along the medial plantar aspect of the midfoot near the base of the medial cuneiform. No evidence of osteomyelitis or abscess.  MR FOOT RIGHT WO CONTRAST 09/12/2023 IMPRESSION: 1. Deep ulceration along the medial plantar aspect of the great toe with sinus tract extending to the IP joint. Associated septic arthritis and osteomyelitis of the first proximal and distal phalanges. No abscess.  Assessment: 1. s/p right great toe amputation. LT foot wound debridement w/ application of Acell. DOS: 09/15/2023 inpatient at [facility]. Dr. Ralene Cork 2.  Peripheral vascular disease 3.  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  Chronic lower extremity edema bilateral 5.  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  Mild cellulitis bilateral lower extremity secondary to edema   Plan of Care:  -Patient was evaluated.  Unfortunately since last visit the edema has returned because he has not been wearing any compression socks or Unna boot to the legs.  We will go ahead and resume the Unna boot therapy -Medically necessary excisional debridement including subcutaneous tissue was performed today to the left foot wound.  Excisional debridement of the necrotic nonviable tissue down to healthier bleeding viable tissue was performed with postdebridement measurement same as pre- -reapplication of multilayer Unna boot compression wraps to the bilateral lower extremities due to the chronic edema to the bilateral legs.  Prefer that he leaves the dressings on for an entire week if he can tolerate.  Patient is amenable to this plan -Continue to advise against going barefoot.  Patient currently wearing good supportive tennis shoes and sneakers.  Continue -Prescription for doxycycline 100 mg twice daily x 10 days -Return to clinic 1 week   Felecia Shelling, [name] [facility]  Dr.  Felecia Shelling, [name]    [address] [place], [place] [address]                Office [phone]  Fax [phone]

## 2024-01-02 ENCOUNTER — Ambulatory Visit: Admitting: Podiatry

## 2024-01-07 ENCOUNTER — Ambulatory Visit (INDEPENDENT_AMBULATORY_CARE_PROVIDER_SITE_OTHER): Admitting: Podiatry

## 2024-01-07 DIAGNOSIS — R600 Localized edema: Secondary | ICD-10-CM | POA: Diagnosis not present

## 2024-01-07 DIAGNOSIS — L97522 Non-pressure chronic ulcer of other part of left foot with fat layer exposed: Secondary | ICD-10-CM

## 2024-01-07 NOTE — Progress Notes (Signed)
 Chief Complaint  Patient presents with   Foot Ulcer    Left foot ulcer     Subjective:  Patient PMHx T2DM, PVD presents today status post RT great toe amputation with left foot wound debridement and application of wound graft (Acell Micromatrix powder w/ wound matrix)  performed inpatient at [facility].  DOS: 09/15/2023.  Subsequently discharged 09/21/2023.    Patient tolerated the multilayer Unna boot compression wraps well throughout the week.  No new complaints  Lab Results  Component Value Date   HGBA1C 7.2 (H) 07/02/2023   HGBA1C 6.4 (H) 10/30/2022   HGBA1C 5.5 06/28/2020    Past Medical History:  Diagnosis Date   (HFpEF) heart failure with preserved ejection fraction (HCC) 03/01/2020   a.) TTE 03/01/2020: EF 55-60%, mod MAC, mod AoV sclerosis, triv AR, mild TR, mod MR, RVSP 50-59; b.) TTE 09/10/2020: EF 55-60%, mod MAC, mod AoV sclerosis, mild TR, 3+ MR, RVSP 50-59; c.) TTE 09/26/2020: EF 55-60%, mild LA dil, triv PR, mild MR/TR, RVSP 37-49   Adenoma of left adrenal gland    Anemia    Arthritis    Atrial fibrillation and flutter (HCC)    a.) CHA2DS2VASc = 5 (age x2, HFpEF, HTN, T2DM);  b.) s/p CTI ablation 09/07/2020; c.) rate/rhythm maintained on oral carvedilol; not on chronic anticoagulation therapy   CAD (coronary artery disease)    Cardiomegaly    CKD (chronic kidney disease), stage III (HCC)    DOE (dyspnea on exertion)    Drug-induced bradycardia    Gangrene of toe of left foot (HCC)    a.) 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    HLD (hyperlipidemia)    Hypertension    Long term current use of aspirin    Lymphedema of both lower extremities    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)    a.) s/p amputation 11/04/2022   Peripheral vascular disease (HCC)    Pleural effusion on right 09/09/2020   a.) s/p RIGHT thoracentesis with 2180 cc yield   Pneumonia    Presence of permanent  cardiac pacemaker 09/10/2020   a.) TVP placement 09/10/2020 due to intermittent CHB in setting of urosepsis; b.) s/p PPM placement 09/15/2020: MDT Azure XT DR (SN: [id number])   Pulmonary hypertension (HCC) 03/01/2020   a.) TTE: 03/01/2020: RVSP 50-59; b.) TTE 09/10/2020: RVSP 50-59; c.) TTE 09/26/2020: RVSP 37-49   RA (rheumatoid arthritis) (HCC)    Rheumatic fever    Sepsis (HCC) 09/10/2020   a.) urosepsis --> BC x 2 sets and UC all grew out significant Proteus mirabilis; admitted to [facility] 09/07/2020 - 09/27/2020.   Sick sinus syndrome [facility])    a.) s/p MDT PPM placement 09/15/2020   T2DM (type 2 diabetes mellitus) (HCC)    Urinary retention    chronic, with indwelling Foley catheter and plans for a suprapubic   Wears dentures    full upper    Past Surgical History:  Procedure Laterality Date   AMPUTATION TOE Right 11/04/2022   Procedure: AMPUTATION TOE;  Surgeon: Dot Gazella, [name];  [name];  Service: Podiatry;  Laterality: Right;   AMPUTATION TOE Right 09/15/2023   Procedure: AMPUTATION TOE;  Surgeon: Jennefer Moats, [name];  [name];  Service: Orthopedics/Podiatry;  Laterality: Right;  Right hallux amputation   BONE BIOPSY Right 04/05/2023   Procedure: BONE BIOPSY THIRD & FOURTH;  Surgeon: Dot Gazella,  [name];  [name];  Service: Podiatry;  Laterality: Right;   CARDIAC ELECTROPHYSIOLOGY STUDY AND ABLATION N/A 09/07/2020   Procedure: CARDIAC EP STUDY AND ABLATION (CTI)   CATARACT EXTRACTION W/PHACO Right 01/16/2017   Procedure: CATARACT EXTRACTION PHACO AND INTRAOCULAR LENS PLACEMENT (IOC)  Right Complicated;  Surgeon: Annell Kidney, MD;  Location: [facility];  Service: Ophthalmology;  Laterality: Right;  IVA Block Healon 5 malyugin vision blue Diabetic - oral meds   CATARACT EXTRACTION W/PHACO Left 10/23/2022   Procedure: CATARACT EXTRACTION PHACO AND INTRAOCULAR LENS PLACEMENT (IOC) LEFT DIABETIC;  Surgeon: Clair Crews, MD;  Location: [facility];  Service: Ophthalmology;  Laterality: Left;  Diabetic   COLONOSCOPY     INCISION AND DRAINAGE OF WOUND Left 09/15/2023   Procedure: IRRIGATION AND DEBRIDEMENT WOUND;  Surgeon: Jennefer Moats, [name];  [name];  Service: Orthopedics/Podiatry;  Laterality: Left;  Left foot wound debridement, possible graft/biopsy   LOWER EXTREMITY ANGIOGRAPHY Right 11/02/2022   Procedure: Lower Extremity Angiography;  Surgeon: Celso College, MD;  Location: [facility];  Service: Cardiovascular;  Laterality: Right;   LOWER EXTREMITY ANGIOGRAPHY Right 09/13/2023   Procedure: Lower Extremity Angiography;  Surgeon: Jackquelyn Mass, MD;  Location: [facility];  Service: Cardiovascular;  Laterality: Right;   METATARSAL HEAD EXCISION Left 07/05/2018   Procedure: RESECTION FIRST METATARSAL INFECTED BONE AND SOFT TISSUE;  Surgeon: Sharlyn Deaner, [name];  [name];  Service: Podiatry;  Laterality: Left;   METATARSAL HEAD EXCISION Right 04/05/2023   Procedure: METATARSAL HEAD EXCISION THIRD & FOURTH;  Surgeon: Dot Gazella, [name];  [name];  Service: Podiatry;  Laterality: Right;   PACEMAKER INSERTION  09/15/2020   TOE AMPUTATION Left    TONSILLECTOMY      Allergies  Allergen Reactions   Eliquis [Apixaban]     Dizziness  and vision change   Spironolactone Other (See Comments)    dizziness    Objective/Physical Exam Stable.  Neurovascular status intact.  Skin is warm to touch.    Continued improvement.  Ulcer noted to the plantar aspect of the fifth MTP of the right foot measuring approximately 1.5 x 1.5 x 0.2 cm.  Again, granular wound base.  No maceration.  No exposed bone muscle tendon ligament or joint.  Periwound is callused  Improved.  Ulcer noted plantar aspect of the left foot measuring approximately 1.5 x 1.5 x 0.2 cm.  Granular wound base.  There is some increased serous drainage noted to the left foot today.  No  purulence.  No malodor.  LOWER EXTREMITY ANGIOGRAPHY 09/13/2023 Findings:  The right common femoral is widely patent as is the profunda femoris.  The SFA has a pre-existing stent which is patent.  There is moderate 40 to 50% stenosis at the leading edge of the existing stent proximally.  Distally there is a greater than 70% stenosis at the distal end of the stent and then there are diffuse lesions some of which are 80% particularly the 1 at the level of the femoral condyles in the mid popliteal.  The distal popliteal demonstrates disease but no hemodynamically significant stenosis and the trifurcation is heavily diseased with occlusion of the tibioperoneal trunk, peroneal and posterior tibial which demonstrates occlusion throughout its course.  The anterior tibial is patent from its origin down to the foot which fills the dorsalis pedis and the pedal arch.   Following angioplasty the SFA and proximal popliteal demonstrate significant residual stenosis as described above.  Therefore a life stent is placed and postdilated with a 6 mm Lutonix balloon.  There is 1 focal area in the mid popliteal as described above which had significant residual stenosis in this area was treated with a 7 mm balloon inflation.  Following this there is wide patency of the entire SFA and popliteal with less than 10% residual stenosis throughout.  The anterior tibial is the single-vessel runoff is preserved all the way down to the foot..  Radiographic Exam RT foot 09/27/2023:  Unchanged from prior x-rays 09/15/2023.  Interval great toe amputation at the level of the MTP.  Prior third toe amputation at the mid metatarsal level with interval resection of the fourth metatarsal head as well.  No acute fracture.  No acute erosions concerning for residual osteomyelitis.  MR FOOT LEFT WO CONTRAST 09/12/2023 IMPRESSION: 1. Skin ulceration along the medial plantar aspect of the midfoot near the base of the medial cuneiform. No evidence of  osteomyelitis or abscess.  MR FOOT RIGHT WO CONTRAST 09/12/2023 IMPRESSION: 1. Deep ulceration along the medial plantar aspect of the great toe with sinus tract extending to the IP joint. Associated septic arthritis and osteomyelitis of the first proximal and distal phalanges. No abscess.  Assessment: 1. s/p right great toe amputation. LT foot wound debridement w/ application of Acell. DOS: 09/15/2023 inpatient at [facility]. Dr. Sikora 2.  Peripheral vascular disease 3.  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  Chronic lower extremity edema bilateral 5.  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  Mild cellulitis bilateral lower extremity secondary to edema   Plan of Care:  -Patient was evaluated.  Unfortunately since last visit the edema has returned because he has not been wearing any compression socks or Unna boot to the legs.  We will go ahead and resume the Unna boot therapy -Medically necessary excisional debridement including subcutaneous tissue was performed today to the left foot wound.  Excisional debridement of the necrotic nonviable tissue down to healthier bleeding viable tissue was performed with postdebridement measurement same as pre- -reapplication of multilayer Unna boot compression wraps to the bilateral lower extremities due to the chronic edema to the bilateral legs.  Prefer that he leaves the dressings on for an entire week if he can tolerate.  Patient is amenable to this plan -Continue to advise against going barefoot.  Patient currently wearing good supportive tennis shoes and sneakers.  Continue -Return to clinic 1 week with Dr. Luster Salters

## 2024-01-08 NOTE — Progress Notes (Signed)
 Chief Complaint  Patient presents with   Wound Check    wound care: Edema of both lower extremities    Subjective:  Patient PMHx T2DM, PVD presents today status post RT great toe amputation with left foot wound debridement and application of wound graft (Acell Micromatrix powder w/ wound matrix)  performed inpatient at [facility].  DOS: 09/15/2023.  Subsequently discharged 09/21/2023.    Patient tolerated the multilayer Unna boot compression wraps well throughout the week.  No new complaints  Lab Results  Component Value Date   HGBA1C 7.2 (H) 07/02/2023   HGBA1C 6.4 (H) 10/30/2022   HGBA1C 5.5 06/28/2020    Past Medical History:  Diagnosis Date   (HFpEF) heart failure with preserved ejection fraction (HCC) 03/01/2020   a.) TTE 03/01/2020: EF 55-60%, mod MAC, mod AoV sclerosis, triv AR, mild TR, mod MR, RVSP 50-59; b.) TTE 09/10/2020: EF 55-60%, mod MAC, mod AoV sclerosis, mild TR, 3+ MR, RVSP 50-59; c.) TTE 09/26/2020: EF 55-60%, mild LA dil, triv PR, mild MR/TR, RVSP 37-49   Adenoma of left adrenal gland    Anemia    Arthritis    Atrial fibrillation and flutter (HCC)    a.) CHA2DS2VASc = 5 (age x2, HFpEF, HTN, T2DM);  b.) s/p CTI ablation 09/07/2020; c.) rate/rhythm maintained on oral carvedilol; not on chronic anticoagulation therapy   CAD (coronary artery disease)    Cardiomegaly    CKD (chronic kidney disease), stage III (HCC)    DOE (dyspnea on exertion)    Drug-induced bradycardia    Gangrene of toe of left foot (HCC)    a.) 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    HLD (hyperlipidemia)    Hypertension    Long term current use of aspirin    Lymphedema of both lower extremities    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)    a.) s/p amputation 11/04/2022   Peripheral vascular disease (HCC)    Pleural effusion on right 09/09/2020   a.) s/p RIGHT thoracentesis with 2180 cc yield    Pneumonia    Presence of permanent cardiac pacemaker 09/10/2020   a.) TVP placement 09/10/2020 due to intermittent CHB in setting of urosepsis; b.) s/p PPM placement 09/15/2020: MDT Azure XT DR (SN: [id number])   Pulmonary hypertension (HCC) 03/01/2020   a.) TTE: 03/01/2020: RVSP 50-59; b.) TTE 09/10/2020: RVSP 50-59; c.) TTE 09/26/2020: RVSP 37-49   RA (rheumatoid arthritis) (HCC)    Rheumatic fever    Sepsis (HCC) 09/10/2020   a.) urosepsis --> BC x 2 sets and UC all grew out significant Proteus mirabilis; admitted to [facility] 09/07/2020 - 09/27/2020.   Sick sinus syndrome [facility])    a.) s/p MDT PPM placement 09/15/2020   T2DM (type 2 diabetes mellitus) (HCC)    Urinary retention    chronic, with indwelling Foley catheter and plans for a suprapubic   Wears dentures    full upper    Past Surgical History:  Procedure Laterality Date   AMPUTATION TOE Right 11/04/2022   Procedure: AMPUTATION TOE;  Surgeon: Dot Gazella, [name];  [name];  Service: Podiatry;  Laterality: Right;   AMPUTATION TOE Right 09/15/2023   Procedure: AMPUTATION TOE;  Surgeon: Jennefer Moats, [name];  [name];  Service: Orthopedics/Podiatry;  Laterality: Right;  Right hallux amputation   BONE BIOPSY Right 04/05/2023   Procedure: BONE BIOPSY THIRD & FOURTH;  Surgeon:  Dot Gazella, [name];  [name];  Service: Podiatry;  Laterality: Right;   CARDIAC ELECTROPHYSIOLOGY STUDY AND ABLATION N/A 09/07/2020   Procedure: CARDIAC EP STUDY AND ABLATION (CTI)   CATARACT EXTRACTION W/PHACO Right 01/16/2017   Procedure: CATARACT EXTRACTION PHACO AND INTRAOCULAR LENS PLACEMENT (IOC)  Right Complicated;  Surgeon: Annell Kidney, MD;  Location: [facility];  Service: Ophthalmology;  Laterality: Right;  IVA Block Healon 5 malyugin vision blue Diabetic - oral meds   CATARACT EXTRACTION W/PHACO Left 10/23/2022   Procedure: CATARACT EXTRACTION PHACO AND INTRAOCULAR LENS PLACEMENT (IOC)  LEFT DIABETIC;  Surgeon: Clair Crews, MD;  Location: [facility];  Service: Ophthalmology;  Laterality: Left;  Diabetic   COLONOSCOPY     INCISION AND DRAINAGE OF WOUND Left 09/15/2023   Procedure: IRRIGATION AND DEBRIDEMENT WOUND;  Surgeon: Jennefer Moats, [name];  [name];  Service: Orthopedics/Podiatry;  Laterality: Left;  Left foot wound debridement, possible graft/biopsy   LOWER EXTREMITY ANGIOGRAPHY Right 11/02/2022   Procedure: Lower Extremity Angiography;  Surgeon: Celso College, MD;  Location: [facility];  Service: Cardiovascular;  Laterality: Right;   LOWER EXTREMITY ANGIOGRAPHY Right 09/13/2023   Procedure: Lower Extremity Angiography;  Surgeon: Jackquelyn Mass, MD;  Location: [facility];  Service: Cardiovascular;  Laterality: Right;   METATARSAL HEAD EXCISION Left 07/05/2018   Procedure: RESECTION FIRST METATARSAL INFECTED BONE AND SOFT TISSUE;  Surgeon: Sharlyn Deaner, [name];  [name];  Service: Podiatry;  Laterality: Left;   METATARSAL HEAD EXCISION Right 04/05/2023   Procedure: METATARSAL HEAD EXCISION THIRD & FOURTH;  Surgeon: Dot Gazella, [name];  [name];  Service: Podiatry;  Laterality: Right;   PACEMAKER INSERTION  09/15/2020   TOE AMPUTATION Left    TONSILLECTOMY      Allergies  Allergen Reactions   Eliquis [Apixaban]     Dizziness  and vision change   Spironolactone Other (See Comments)    dizziness    RT foot 10/04/2023  LT foot 10/04/2023   RT foot 11/05/2023  LT foot 11/05/2023   RT foot 12/13/2023  LT foot 12/13/2023  Objective/Physical Exam Stable.  Neurovascular status intact.  Skin is warm to touch.    Continued improvement.  Ulcer noted to the plantar aspect of the fifth MTP of the right foot measuring approximately 1.5 x 1.5 x 0.2 cm.  Again, granular wound base.  No maceration.  No exposed bone muscle tendon ligament or joint.  Periwound is callused  Improved.  Ulcer noted plantar  aspect of the left foot measuring approximately 1.5 x 1.5 x 0.2 cm.  Granular wound base.  There is some increased serous drainage noted to the left foot today.  No purulence.  No malodor.  LOWER EXTREMITY ANGIOGRAPHY 09/13/2023 Findings:  The right common femoral is widely patent as is the profunda femoris.  The SFA has a pre-existing stent which is patent.  There is moderate 40 to 50% stenosis at the leading edge of the existing stent proximally.  Distally there is a greater than 70% stenosis at the distal end of the stent and then there are diffuse lesions some of which are 80% particularly the 1 at the level of the femoral condyles in the mid popliteal.  The distal popliteal demonstrates disease but no hemodynamically significant stenosis and the trifurcation is heavily diseased with occlusion of the tibioperoneal trunk, peroneal and posterior tibial which demonstrates occlusion throughout its course.  The anterior tibial is patent from its origin  down to the foot which fills the dorsalis pedis and the pedal arch.   Following angioplasty the SFA and proximal popliteal demonstrate significant residual stenosis as described above.  Therefore a life stent is placed and postdilated with a 6 mm Lutonix balloon.  There is 1 focal area in the mid popliteal as described above which had significant residual stenosis in this area was treated with a 7 mm balloon inflation.  Following this there is wide patency of the entire SFA and popliteal with less than 10% residual stenosis throughout.  The anterior tibial is the single-vessel runoff is preserved all the way down to the foot..  Radiographic Exam RT foot 09/27/2023:  Unchanged from prior x-rays 09/15/2023.  Interval great toe amputation at the level of the MTP.  Prior third toe amputation at the mid metatarsal level with interval resection of the fourth metatarsal head as well.  No acute fracture.  No acute erosions concerning for residual osteomyelitis.  MR  FOOT LEFT WO CONTRAST 09/12/2023 IMPRESSION: 1. Skin ulceration along the medial plantar aspect of the midfoot near the base of the medial cuneiform. No evidence of osteomyelitis or abscess.  MR FOOT RIGHT WO CONTRAST 09/12/2023 IMPRESSION: 1. Deep ulceration along the medial plantar aspect of the great toe with sinus tract extending to the IP joint. Associated septic arthritis and osteomyelitis of the first proximal and distal phalanges. No abscess.  Assessment: 1. s/p right great toe amputation. LT foot wound debridement w/ application of Acell. DOS: 09/15/2023 inpatient at [facility]. Dr. Sikora 2.  Peripheral vascular disease 3.  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  Chronic lower extremity edema bilateral 5.  Cellulitis left foot; resolved 6.  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   -Medically necessary excisional debridement including subcutaneous tissue was performed today to the left foot wound.  Excisional debridement of the necrotic nonviable tissue down to healthier bleeding viable tissue was performed with postdebridement measurement same as pre- -patient requested this morning that we do not reapply the Unna boot compression wraps to allow his legs to rest and so he can wash his legs.  Recommend compression socks the patient already has daily -Wound care daily.  Dressings provided -Continue to advise against going barefoot.  Patient currently wearing good supportive tennis shoes and sneakers.  Continue -Return to clinic 1 week   Dot Gazella, [name] [facility]  Dr. Dot Gazella, [name]    [address] [place], [place] [address]                Office [phone]  Fax [phone]

## 2024-01-14 ENCOUNTER — Ambulatory Visit (INDEPENDENT_AMBULATORY_CARE_PROVIDER_SITE_OTHER): Admitting: Podiatry

## 2024-01-14 ENCOUNTER — Encounter: Payer: Self-pay | Admitting: Podiatry

## 2024-01-14 DIAGNOSIS — L97522 Non-pressure chronic ulcer of other part of left foot with fat layer exposed: Secondary | ICD-10-CM | POA: Diagnosis not present

## 2024-01-14 DIAGNOSIS — M19079 Primary osteoarthritis, unspecified ankle and foot: Secondary | ICD-10-CM | POA: Diagnosis not present

## 2024-01-14 DIAGNOSIS — R600 Localized edema: Secondary | ICD-10-CM

## 2024-01-21 ENCOUNTER — Ambulatory Visit (INDEPENDENT_AMBULATORY_CARE_PROVIDER_SITE_OTHER): Admitting: Podiatry

## 2024-01-21 ENCOUNTER — Encounter: Payer: Self-pay | Admitting: Podiatry

## 2024-01-21 DIAGNOSIS — L97522 Non-pressure chronic ulcer of other part of left foot with fat layer exposed: Secondary | ICD-10-CM | POA: Diagnosis not present

## 2024-01-21 DIAGNOSIS — E119 Type 2 diabetes mellitus without complications: Secondary | ICD-10-CM | POA: Diagnosis not present

## 2024-01-21 NOTE — Progress Notes (Signed)
 Chief Complaint  Patient presents with   Diabetic Ulcer    "Okay, I guess."    Subjective:  Patient PMHx T2DM, PVD presents today status post RT great toe amputation with left foot wound debridement and application of wound graft (Acell Micromatrix powder w/ wound matrix)  performed inpatient at [facility].  DOS: 09/15/2023.  Subsequently discharged 09/21/2023.     Lab Results  Component Value Date   HGBA1C 7.2 (H) 07/02/2023   HGBA1C 6.4 (H) 10/30/2022   HGBA1C 5.5 06/28/2020    Past Medical History:  Diagnosis Date   (HFpEF) heart failure with preserved ejection fraction (HCC) 03/01/2020   a.) TTE 03/01/2020: EF 55-60%, mod MAC, mod AoV sclerosis, triv AR, mild TR, mod MR, RVSP 50-59; b.) TTE 09/10/2020: EF 55-60%, mod MAC, mod AoV sclerosis, mild TR, 3+ MR, RVSP 50-59; c.) TTE 09/26/2020: EF 55-60%, mild LA dil, triv PR, mild MR/TR, RVSP 37-49   Adenoma of left adrenal gland    Anemia    Arthritis    Atrial fibrillation and flutter (HCC)    a.) CHA2DS2VASc = 5 (age x2, HFpEF, HTN, T2DM);  b.) s/p CTI ablation 09/07/2020; c.) rate/rhythm maintained on oral carvedilol ; not on chronic anticoagulation therapy   CAD (coronary artery disease)    Cardiomegaly    CKD (chronic kidney disease), stage III (HCC)    DOE (dyspnea on exertion)    Drug-induced bradycardia    Gangrene of toe of left foot (HCC)    a.) 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    HLD (hyperlipidemia)    Hypertension    Long term current use of aspirin     Lymphedema of both lower extremities    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)    a.) s/p amputation 11/04/2022   Peripheral vascular disease (HCC)    Pleural effusion on right 09/09/2020   a.) s/p RIGHT thoracentesis with 2180 cc yield   Pneumonia    Presence of permanent cardiac pacemaker 09/10/2020   a.) TVP placement 09/10/2020 due to intermittent CHB in setting of  urosepsis; b.) s/p PPM placement 09/15/2020: MDT Azure XT DR (SN: [id number])   Pulmonary hypertension (HCC) 03/01/2020   a.) TTE: 03/01/2020: RVSP 50-59; b.) TTE 09/10/2020: RVSP 50-59; c.) TTE 09/26/2020: RVSP 37-49   RA (rheumatoid arthritis) (HCC)    Rheumatic fever    Sepsis (HCC) 09/10/2020   a.) urosepsis --> BC x 2 sets and UC all grew out significant Proteus mirabilis; admitted to [facility] 09/07/2020 - 09/27/2020.   Sick sinus syndrome [facility])    a.) s/p MDT PPM placement 09/15/2020   T2DM (type 2 diabetes mellitus) (HCC)    Urinary retention    chronic, with indwelling Foley catheter and plans for a suprapubic   Wears dentures    full upper    Past Surgical History:  Procedure Laterality Date   AMPUTATION TOE Right 11/04/2022   Procedure: AMPUTATION TOE;  Surgeon: Dot Gazella, [name];  [name];  Service: Podiatry;  Laterality: Right;   AMPUTATION TOE Right 09/15/2023   Procedure: AMPUTATION TOE;  Surgeon: Jennefer Moats, [name];  [name];  Service: Orthopedics/Podiatry;  Laterality: Right;  Right hallux amputation   BONE BIOPSY Right 04/05/2023   Procedure: BONE BIOPSY THIRD & FOURTH;  Surgeon: Dot Gazella, [name];  [name];  Service: Podiatry;  Laterality: Right;   CARDIAC ELECTROPHYSIOLOGY STUDY AND  ABLATION N/A 09/07/2020   Procedure: CARDIAC EP STUDY AND ABLATION (CTI)   CATARACT EXTRACTION W/PHACO Right 01/16/2017   Procedure: CATARACT EXTRACTION PHACO AND INTRAOCULAR LENS PLACEMENT (IOC)  Right Complicated;  Surgeon: Annell Kidney, MD;  Location: [facility];  Service: Ophthalmology;  Laterality: Right;  IVA Block Healon 5 malyugin vision blue Diabetic - oral meds   CATARACT EXTRACTION W/PHACO Left 10/23/2022   Procedure: CATARACT EXTRACTION PHACO AND INTRAOCULAR LENS PLACEMENT (IOC) LEFT DIABETIC;  Surgeon: Clair Crews, MD;  Location: [facility];  Service: Ophthalmology;  Laterality: Left;  Diabetic    COLONOSCOPY     INCISION AND DRAINAGE OF WOUND Left 09/15/2023   Procedure: IRRIGATION AND DEBRIDEMENT WOUND;  Surgeon: Jennefer Moats, [name];  [name];  Service: Orthopedics/Podiatry;  Laterality: Left;  Left foot wound debridement, possible graft/biopsy   LOWER EXTREMITY ANGIOGRAPHY Right 11/02/2022   Procedure: Lower Extremity Angiography;  Surgeon: Celso College, MD;  Location: [facility];  Service: Cardiovascular;  Laterality: Right;   LOWER EXTREMITY ANGIOGRAPHY Right 09/13/2023   Procedure: Lower Extremity Angiography;  Surgeon: Jackquelyn Mass, MD;  Location: [facility];  Service: Cardiovascular;  Laterality: Right;   METATARSAL HEAD EXCISION Left 07/05/2018   Procedure: RESECTION FIRST METATARSAL INFECTED BONE AND SOFT TISSUE;  Surgeon: Sharlyn Deaner, [name];  [name];  Service: Podiatry;  Laterality: Left;   METATARSAL HEAD EXCISION Right 04/05/2023   Procedure: METATARSAL HEAD EXCISION THIRD & FOURTH;  Surgeon: Dot Gazella, [name];  [name];  Service: Podiatry;  Laterality: Right;   PACEMAKER INSERTION  09/15/2020   TOE AMPUTATION Left    TONSILLECTOMY      Allergies  Allergen Reactions   Eliquis  [Apixaban ]     Dizziness  and vision change   Spironolactone  Other (See Comments)    dizziness    RT foot 10/04/2023  LT foot 10/04/2023   RT foot 11/05/2023  LT foot 11/05/2023   RT foot 12/13/2023  LT foot 12/13/2023  Objective/Physical Exam Stable.  Neurovascular status intact.  Skin is warm to touch.    Continued improvement.  Ulcer noted to the plantar aspect of the fifth MTP of the right foot measuring approximately 1.5 x 1.5 x 0.2 cm.  Again, granular wound base.  No maceration.  No exposed bone muscle tendon ligament or joint.  Periwound is callused  Improved.  Ulcer noted plantar aspect of the left foot measuring approximately 1.5 x 1.5 x 0.2 cm.  Granular wound base.  There is some increased serous drainage  noted to the left foot today.  No purulence.  No malodor.  LOWER EXTREMITY ANGIOGRAPHY 09/13/2023 Findings:  The right common femoral is widely patent as is the profunda femoris.  The SFA has a pre-existing stent which is patent.  There is moderate 40 to 50% stenosis at the leading edge of the existing stent proximally.  Distally there is a greater than 70% stenosis at the distal end of the stent and then there are diffuse lesions some of which are 80% particularly the 1 at the level of the femoral condyles in the mid popliteal.  The distal popliteal demonstrates disease but no hemodynamically significant stenosis and the trifurcation is heavily diseased with occlusion of the tibioperoneal trunk, peroneal and posterior tibial which demonstrates occlusion throughout its course.  The anterior tibial is patent from its origin down to the foot which fills the dorsalis pedis and the pedal arch.   Following angioplasty the SFA and  proximal popliteal demonstrate significant residual stenosis as described above.  Therefore a life stent is placed and postdilated with a 6 mm Lutonix balloon.  There is 1 focal area in the mid popliteal as described above which had significant residual stenosis in this area was treated with a 7 mm balloon inflation.  Following this there is wide patency of the entire SFA and popliteal with less than 10% residual stenosis throughout.  The anterior tibial is the single-vessel runoff is preserved all the way down to the foot..  Radiographic Exam RT foot 09/27/2023:  Unchanged from prior x-rays 09/15/2023.  Interval great toe amputation at the level of the MTP.  Prior third toe amputation at the mid metatarsal level with interval resection of the fourth metatarsal head as well.  No acute fracture.  No acute erosions concerning for residual osteomyelitis.  MR FOOT LEFT WO CONTRAST 09/12/2023 IMPRESSION: 1. Skin ulceration along the medial plantar aspect of the midfoot near the base of  the medial cuneiform. No evidence of osteomyelitis or abscess.  MR FOOT RIGHT WO CONTRAST 09/12/2023 IMPRESSION: 1. Deep ulceration along the medial plantar aspect of the great toe with sinus tract extending to the IP joint. Associated septic arthritis and osteomyelitis of the first proximal and distal phalanges. No abscess.  Assessment: 1. s/p right great toe amputation. LT foot wound debridement w/ application of Acell. DOS: 09/15/2023 inpatient at [facility] Dr. Sikora 2.  Peripheral vascular disease 3.  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  Chronic lower extremity edema bilateral 5.  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  Mild cellulitis bilateral lower extremity secondary to edema   Plan of Care:  -Patient was evaluated.  Unfortunately since last visit the edema has returned because he has not been wearing any compression socks or Unna boot to the legs.   -Medically necessary excisional debridement including subcutaneous tissue was performed today to the left foot wound.  Excisional debridement of the necrotic nonviable tissue down to healthier bleeding viable tissue was performed with postdebridement measurement same as pre- -Dressings applied -Patient brought his compression socks today.  No multilayer Unna boot compression wrap was applied today.  Wear compression socks daily -Continue to advise against going barefoot.  Patient currently wearing good supportive tennis shoes and sneakers.  Continue - Return to clinic 1 week   Dot Gazella, [name] [facility]  Dr. Dot Gazella, [name]    [address] [place], [place] [address]                Office [phone]  Fax [phone]

## 2024-01-21 NOTE — Progress Notes (Signed)
 Chief Complaint  Patient presents with   Diabetic Ulcer    "I don't know, I give up."    Subjective:  Patient PMHx T2DM, PVD presents today status post RT great toe amputation with left foot wound debridement and application of wound graft (Acell Micromatrix powder w/ wound matrix)  performed inpatient at [facility].  DOS: 09/15/2023.  Subsequently discharged 09/21/2023.    Patient tolerated the multilayer Unna boot compression wraps well throughout the week.  No new complaints  Lab Results  Component Value Date   HGBA1C 7.2 (H) 07/02/2023   HGBA1C 6.4 (H) 10/30/2022   HGBA1C 5.5 06/28/2020    Past Medical History:  Diagnosis Date   (HFpEF) heart failure with preserved ejection fraction (HCC) 03/01/2020   a.) TTE 03/01/2020: EF 55-60%, mod MAC, mod AoV sclerosis, triv AR, mild TR, mod MR, RVSP 50-59; b.) TTE 09/10/2020: EF 55-60%, mod MAC, mod AoV sclerosis, mild TR, 3+ MR, RVSP 50-59; c.) TTE 09/26/2020: EF 55-60%, mild LA dil, triv PR, mild MR/TR, RVSP 37-49   Adenoma of left adrenal gland    Anemia    Arthritis    Atrial fibrillation and flutter (HCC)    a.) CHA2DS2VASc = 5 (age x2, HFpEF, HTN, T2DM);  b.) s/p CTI ablation 09/07/2020; c.) rate/rhythm maintained on oral carvedilol ; not on chronic anticoagulation therapy   CAD (coronary artery disease)    Cardiomegaly    CKD (chronic kidney disease), stage III (HCC)    DOE (dyspnea on exertion)    Drug-induced bradycardia    Gangrene of toe of left foot (HCC)    a.) 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    HLD (hyperlipidemia)    Hypertension    Long term current use of aspirin     Lymphedema of both lower extremities    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)    a.) s/p amputation 11/04/2022   Peripheral vascular disease (HCC)    Pleural effusion on right 09/09/2020   a.) s/p RIGHT thoracentesis with 2180 cc yield   Pneumonia     Presence of permanent cardiac pacemaker 09/10/2020   a.) TVP placement 09/10/2020 due to intermittent CHB in setting of urosepsis; b.) s/p PPM placement 09/15/2020: MDT Azure XT DR (SN: [id number])   Pulmonary hypertension (HCC) 03/01/2020   a.) TTE: 03/01/2020: RVSP 50-59; b.) TTE 09/10/2020: RVSP 50-59; c.) TTE 09/26/2020: RVSP 37-49   RA (rheumatoid arthritis) (HCC)    Rheumatic fever    Sepsis (HCC) 09/10/2020   a.) urosepsis --> BC x 2 sets and UC all grew out significant Proteus mirabilis; admitted to [facility] 09/07/2020 - 09/27/2020.   Sick sinus syndrome [facility])    a.) s/p MDT PPM placement 09/15/2020   T2DM (type 2 diabetes mellitus) (HCC)    Urinary retention    chronic, with indwelling Foley catheter and plans for a suprapubic   Wears dentures    full upper    Past Surgical History:  Procedure Laterality Date   AMPUTATION TOE Right 11/04/2022   Procedure: AMPUTATION TOE;  Surgeon: Dot Gazella, [name];  [name];  Service: Podiatry;  Laterality: Right;   AMPUTATION TOE Right 09/15/2023   Procedure: AMPUTATION TOE;  Surgeon: Jennefer Moats, [name];  [name];  Service: Orthopedics/Podiatry;  Laterality: Right;  Right hallux amputation   BONE BIOPSY Right 04/05/2023   Procedure: BONE BIOPSY THIRD & FOURTH;  Surgeon: Luster Salters,  Ollie Bhat, [name];  [name];  Service: Podiatry;  Laterality: Right;   CARDIAC ELECTROPHYSIOLOGY STUDY AND ABLATION N/A 09/07/2020   Procedure: CARDIAC EP STUDY AND ABLATION (CTI)   CATARACT EXTRACTION W/PHACO Right 01/16/2017   Procedure: CATARACT EXTRACTION PHACO AND INTRAOCULAR LENS PLACEMENT (IOC)  Right Complicated;  Surgeon: Annell Kidney, MD;  Location: [facility];  Service: Ophthalmology;  Laterality: Right;  IVA Block Healon 5 malyugin vision blue Diabetic - oral meds   CATARACT EXTRACTION W/PHACO Left 10/23/2022   Procedure: CATARACT EXTRACTION PHACO AND INTRAOCULAR LENS PLACEMENT (IOC) LEFT  DIABETIC;  Surgeon: Clair Crews, MD;  Location: [facility];  Service: Ophthalmology;  Laterality: Left;  Diabetic   COLONOSCOPY     INCISION AND DRAINAGE OF WOUND Left 09/15/2023   Procedure: IRRIGATION AND DEBRIDEMENT WOUND;  Surgeon: Jennefer Moats, [name];  [name];  Service: Orthopedics/Podiatry;  Laterality: Left;  Left foot wound debridement, possible graft/biopsy   LOWER EXTREMITY ANGIOGRAPHY Right 11/02/2022   Procedure: Lower Extremity Angiography;  Surgeon: Celso College, MD;  Location: [facility] INVASIVE CV LAB;  Service: Cardiovascular;  Laterality: Right;   LOWER EXTREMITY ANGIOGRAPHY Right 09/13/2023   Procedure: Lower Extremity Angiography;  Surgeon: Jackquelyn Mass, MD;  Location: [facility] INVASIVE CV LAB;  Service: Cardiovascular;  Laterality: Right;   METATARSAL HEAD EXCISION Left 07/05/2018   Procedure: RESECTION FIRST METATARSAL INFECTED BONE AND SOFT TISSUE;  Surgeon: Sharlyn Deaner, [name];  [name];  Service: Podiatry;  Laterality: Left;   METATARSAL HEAD EXCISION Right 04/05/2023   Procedure: METATARSAL HEAD EXCISION THIRD & FOURTH;  Surgeon: Dot Gazella, [name];  [name];  Service: Podiatry;  Laterality: Right;   PACEMAKER INSERTION  09/15/2020   TOE AMPUTATION Left    TONSILLECTOMY      Allergies  Allergen Reactions   Eliquis  [Apixaban ]     Dizziness  and vision change   Spironolactone  Other (See Comments)    dizziness    RT foot 10/04/2023  LT foot 10/04/2023   RT foot 11/05/2023  LT foot 11/05/2023   RT foot 12/13/2023  LT foot 12/13/2023  Objective/Physical Exam Stable.  Neurovascular status intact.  Skin is warm to touch.    Continued improvement.  Ulcer noted to the plantar aspect of the fifth MTP of the right foot measuring approximately 1.5 x 1.5 x 0.2 cm.  Again, granular wound base.  No maceration.  No exposed bone muscle tendon ligament or joint.  Periwound is callused  Improved.  Ulcer noted plantar aspect  of the left foot measuring approximately 1.5 x 1.5 x 0.2 cm.  Granular wound base.  There is some increased serous drainage noted to the left foot today.  No purulence.  No malodor.  LOWER EXTREMITY ANGIOGRAPHY 09/13/2023 Findings:  The right common femoral is widely patent as is the profunda femoris.  The SFA has a pre-existing stent which is patent.  There is moderate 40 to 50% stenosis at the leading edge of the existing stent proximally.  Distally there is a greater than 70% stenosis at the distal end of the stent and then there are diffuse lesions some of which are 80% particularly the 1 at the level of the femoral condyles in the mid popliteal.  The distal popliteal demonstrates disease but no hemodynamically significant stenosis and the trifurcation is heavily diseased with occlusion of the tibioperoneal trunk, peroneal and posterior tibial which demonstrates occlusion throughout its course.  The anterior tibial is patent from its origin down  to the foot which fills the dorsalis pedis and the pedal arch.   Following angioplasty the SFA and proximal popliteal demonstrate significant residual stenosis as described above.  Therefore a life stent is placed and postdilated with a 6 mm Lutonix balloon.  There is 1 focal area in the mid popliteal as described above which had significant residual stenosis in this area was treated with a 7 mm balloon inflation.  Following this there is wide patency of the entire SFA and popliteal with less than 10% residual stenosis throughout.  The anterior tibial is the single-vessel runoff is preserved all the way down to the foot..  Radiographic Exam RT foot 09/27/2023:  Unchanged from prior x-rays 09/15/2023.  Interval great toe amputation at the level of the MTP.  Prior third toe amputation at the mid metatarsal level with interval resection of the fourth metatarsal head as well.  No acute fracture.  No acute erosions concerning for residual osteomyelitis.  MR FOOT LEFT  WO CONTRAST 09/12/2023 IMPRESSION: 1. Skin ulceration along the medial plantar aspect of the midfoot near the base of the medial cuneiform. No evidence of osteomyelitis or abscess.  MR FOOT RIGHT WO CONTRAST 09/12/2023 IMPRESSION: 1. Deep ulceration along the medial plantar aspect of the great toe with sinus tract extending to the IP joint. Associated septic arthritis and osteomyelitis of the first proximal and distal phalanges. No abscess.  Assessment: 1. s/p right great toe amputation. LT foot wound debridement w/ application of Acell. DOS: 09/15/2023 inpatient at [facility]. Dr. Sikora 2.  Peripheral vascular disease 3.  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  Chronic lower extremity edema bilateral 5.  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  Mild cellulitis bilateral lower extremity secondary to edema   Plan of Care:  -Patient was evaluated.  Unfortunately since last visit the edema has returned because he has not been wearing any compression socks or Unna boot to the legs.  We will go ahead and resume the Unna boot therapy -Medically necessary excisional debridement including subcutaneous tissue was performed today to the left foot wound.  Excisional debridement of the necrotic nonviable tissue down to healthier bleeding viable tissue was performed with postdebridement measurement same as pre- -reapplication of multilayer Unna boot compression wraps to the bilateral lower extremities due to the chronic edema to the bilateral legs.  Prefer that he leaves the dressings on for an entire week if he can tolerate.  Patient is amenable to this plan -Continue to advise against going barefoot.  Patient currently wearing good supportive tennis shoes and sneakers.  Continue -Return to clinic 1 week   Dot Gazella, [name] [facility]  Dr. Dot Gazella, [name]    [address] [place], [place] [address]                Office  [phone]  Fax [phone]

## 2024-01-24 ENCOUNTER — Telehealth: Payer: Self-pay | Admitting: Cardiology

## 2024-01-24 NOTE — Telephone Encounter (Signed)
 Called to confirm/remind patient of their appointment at [facility] on 01/27/24.   Appointment:   [] Confirmed  [] Left mess   [x] No answer/No voice mail  [] VM Full/unable to leave message  [] Phone not in service  Patient reminded to bring all medications and/or complete list.  Confirmed patient has transportation. Gave directions, instructed to utilize valet parking.

## 2024-01-26 ENCOUNTER — Emergency Department

## 2024-01-26 ENCOUNTER — Encounter: Payer: Self-pay | Admitting: Emergency Medicine

## 2024-01-26 DIAGNOSIS — R000 Tachycardia, unspecified: Secondary | ICD-10-CM | POA: Diagnosis not present

## 2024-01-26 DIAGNOSIS — B952 Enterococcus as the cause of diseases classified elsewhere: Secondary | ICD-10-CM | POA: Diagnosis not present

## 2024-01-26 DIAGNOSIS — I3481 Nonrheumatic mitral (valve) annulus calcification: Secondary | ICD-10-CM | POA: Diagnosis not present

## 2024-01-26 DIAGNOSIS — M069 Rheumatoid arthritis, unspecified: Secondary | ICD-10-CM | POA: Diagnosis present

## 2024-01-26 DIAGNOSIS — E440 Moderate protein-calorie malnutrition: Secondary | ICD-10-CM | POA: Diagnosis present

## 2024-01-26 DIAGNOSIS — E785 Hyperlipidemia, unspecified: Secondary | ICD-10-CM | POA: Diagnosis not present

## 2024-01-26 DIAGNOSIS — I10 Essential (primary) hypertension: Secondary | ICD-10-CM

## 2024-01-26 DIAGNOSIS — I2119 ST elevation (STEMI) myocardial infarction involving other coronary artery of inferior wall: Secondary | ICD-10-CM | POA: Diagnosis not present

## 2024-01-26 DIAGNOSIS — N390 Urinary tract infection, site not specified: Secondary | ICD-10-CM | POA: Diagnosis present

## 2024-01-26 DIAGNOSIS — I38 Endocarditis, valve unspecified: Secondary | ICD-10-CM | POA: Diagnosis not present

## 2024-01-26 DIAGNOSIS — R937 Abnormal findings on diagnostic imaging of other parts of musculoskeletal system: Secondary | ICD-10-CM | POA: Diagnosis not present

## 2024-01-26 DIAGNOSIS — I5022 Chronic systolic (congestive) heart failure: Secondary | ICD-10-CM | POA: Diagnosis not present

## 2024-01-26 DIAGNOSIS — I4430 Unspecified atrioventricular block: Secondary | ICD-10-CM | POA: Diagnosis present

## 2024-01-26 DIAGNOSIS — R6520 Severe sepsis without septic shock: Secondary | ICD-10-CM | POA: Diagnosis not present

## 2024-01-26 DIAGNOSIS — E1151 Type 2 diabetes mellitus with diabetic peripheral angiopathy without gangrene: Secondary | ICD-10-CM | POA: Diagnosis present

## 2024-01-26 DIAGNOSIS — Z66 Do not resuscitate: Secondary | ICD-10-CM | POA: Diagnosis not present

## 2024-01-26 DIAGNOSIS — I472 Ventricular tachycardia, unspecified: Secondary | ICD-10-CM | POA: Diagnosis not present

## 2024-01-26 DIAGNOSIS — Z794 Long term (current) use of insulin: Secondary | ICD-10-CM | POA: Diagnosis not present

## 2024-01-26 DIAGNOSIS — I272 Pulmonary hypertension, unspecified: Secondary | ICD-10-CM | POA: Diagnosis not present

## 2024-01-26 DIAGNOSIS — R0682 Tachypnea, not elsewhere classified: Secondary | ICD-10-CM | POA: Diagnosis not present

## 2024-01-26 DIAGNOSIS — I4892 Unspecified atrial flutter: Secondary | ICD-10-CM | POA: Diagnosis present

## 2024-01-26 DIAGNOSIS — I672 Cerebral atherosclerosis: Secondary | ICD-10-CM | POA: Diagnosis not present

## 2024-01-26 DIAGNOSIS — E872 Acidosis, unspecified: Secondary | ICD-10-CM | POA: Diagnosis present

## 2024-01-26 DIAGNOSIS — N179 Acute kidney failure, unspecified: Secondary | ICD-10-CM | POA: Diagnosis not present

## 2024-01-26 DIAGNOSIS — I21A1 Myocardial infarction type 2: Secondary | ICD-10-CM | POA: Diagnosis not present

## 2024-01-26 DIAGNOSIS — I517 Cardiomegaly: Secondary | ICD-10-CM | POA: Diagnosis not present

## 2024-01-26 DIAGNOSIS — N189 Chronic kidney disease, unspecified: Secondary | ICD-10-CM | POA: Diagnosis not present

## 2024-01-26 DIAGNOSIS — Y846 Urinary catheterization as the cause of abnormal reaction of the patient, or of later complication, without mention of misadventure at the time of the procedure: Secondary | ICD-10-CM | POA: Diagnosis present

## 2024-01-26 DIAGNOSIS — I442 Atrioventricular block, complete: Secondary | ICD-10-CM | POA: Diagnosis not present

## 2024-01-26 DIAGNOSIS — I5033 Acute on chronic diastolic (congestive) heart failure: Secondary | ICD-10-CM | POA: Diagnosis not present

## 2024-01-26 DIAGNOSIS — I5023 Acute on chronic systolic (congestive) heart failure: Secondary | ICD-10-CM | POA: Diagnosis not present

## 2024-01-26 DIAGNOSIS — N170 Acute kidney failure with tubular necrosis: Secondary | ICD-10-CM | POA: Diagnosis not present

## 2024-01-26 DIAGNOSIS — L03116 Cellulitis of left lower limb: Secondary | ICD-10-CM | POA: Diagnosis present

## 2024-01-26 DIAGNOSIS — R4182 Altered mental status, unspecified: Secondary | ICD-10-CM | POA: Diagnosis not present

## 2024-01-26 DIAGNOSIS — J9621 Acute and chronic respiratory failure with hypoxia: Secondary | ICD-10-CM | POA: Diagnosis present

## 2024-01-26 DIAGNOSIS — Z950 Presence of cardiac pacemaker: Secondary | ICD-10-CM

## 2024-01-26 DIAGNOSIS — Z7982 Long term (current) use of aspirin: Secondary | ICD-10-CM

## 2024-01-26 DIAGNOSIS — E1121 Type 2 diabetes mellitus with diabetic nephropathy: Secondary | ICD-10-CM | POA: Diagnosis not present

## 2024-01-26 DIAGNOSIS — Z9842 Cataract extraction status, left eye: Secondary | ICD-10-CM

## 2024-01-26 DIAGNOSIS — J90 Pleural effusion, not elsewhere classified: Secondary | ICD-10-CM | POA: Diagnosis not present

## 2024-01-26 DIAGNOSIS — I76 Septic arterial embolism: Secondary | ICD-10-CM | POA: Diagnosis present

## 2024-01-26 DIAGNOSIS — R918 Other nonspecific abnormal finding of lung field: Secondary | ICD-10-CM | POA: Diagnosis not present

## 2024-01-26 DIAGNOSIS — E11621 Type 2 diabetes mellitus with foot ulcer: Secondary | ICD-10-CM | POA: Diagnosis not present

## 2024-01-26 DIAGNOSIS — R0689 Other abnormalities of breathing: Secondary | ICD-10-CM

## 2024-01-26 DIAGNOSIS — Z89412 Acquired absence of left great toe: Secondary | ICD-10-CM

## 2024-01-26 DIAGNOSIS — I350 Nonrheumatic aortic (valve) stenosis: Secondary | ICD-10-CM | POA: Diagnosis not present

## 2024-01-26 DIAGNOSIS — E1169 Type 2 diabetes mellitus with other specified complication: Secondary | ICD-10-CM | POA: Diagnosis present

## 2024-01-26 DIAGNOSIS — L819 Disorder of pigmentation, unspecified: Secondary | ICD-10-CM | POA: Diagnosis not present

## 2024-01-26 DIAGNOSIS — I5020 Unspecified systolic (congestive) heart failure: Secondary | ICD-10-CM | POA: Diagnosis not present

## 2024-01-26 DIAGNOSIS — M11262 Other chondrocalcinosis, left knee: Secondary | ICD-10-CM | POA: Diagnosis present

## 2024-01-26 DIAGNOSIS — T83511A Infection and inflammatory reaction due to indwelling urethral catheter, initial encounter: Secondary | ICD-10-CM | POA: Diagnosis not present

## 2024-01-26 DIAGNOSIS — Z515 Encounter for palliative care: Secondary | ICD-10-CM

## 2024-01-26 DIAGNOSIS — I871 Compression of vein: Secondary | ICD-10-CM | POA: Diagnosis not present

## 2024-01-26 DIAGNOSIS — I5043 Acute on chronic combined systolic (congestive) and diastolic (congestive) heart failure: Secondary | ICD-10-CM | POA: Diagnosis not present

## 2024-01-26 DIAGNOSIS — I7121 Aneurysm of the ascending aorta, without rupture: Secondary | ICD-10-CM | POA: Diagnosis not present

## 2024-01-26 DIAGNOSIS — D649 Anemia, unspecified: Secondary | ICD-10-CM | POA: Diagnosis present

## 2024-01-26 DIAGNOSIS — L8990 Pressure ulcer of unspecified site, unspecified stage: Secondary | ICD-10-CM

## 2024-01-26 DIAGNOSIS — J9601 Acute respiratory failure with hypoxia: Secondary | ICD-10-CM | POA: Diagnosis not present

## 2024-01-26 DIAGNOSIS — Z604 Social exclusion and rejection: Secondary | ICD-10-CM | POA: Diagnosis present

## 2024-01-26 DIAGNOSIS — Z79899 Other long term (current) drug therapy: Secondary | ICD-10-CM

## 2024-01-26 DIAGNOSIS — I493 Ventricular premature depolarization: Secondary | ICD-10-CM | POA: Diagnosis present

## 2024-01-26 DIAGNOSIS — N400 Enlarged prostate without lower urinary tract symptoms: Secondary | ICD-10-CM | POA: Diagnosis present

## 2024-01-26 DIAGNOSIS — Z89421 Acquired absence of other right toe(s): Secondary | ICD-10-CM

## 2024-01-26 DIAGNOSIS — S81809A Unspecified open wound, unspecified lower leg, initial encounter: Secondary | ICD-10-CM | POA: Diagnosis not present

## 2024-01-26 DIAGNOSIS — A419 Sepsis, unspecified organism: Secondary | ICD-10-CM | POA: Diagnosis present

## 2024-01-26 DIAGNOSIS — Z7902 Long term (current) use of antithrombotics/antiplatelets: Secondary | ICD-10-CM

## 2024-01-26 DIAGNOSIS — M25462 Effusion, left knee: Secondary | ICD-10-CM

## 2024-01-26 DIAGNOSIS — Z9841 Cataract extraction status, right eye: Secondary | ICD-10-CM

## 2024-01-26 DIAGNOSIS — I6523 Occlusion and stenosis of bilateral carotid arteries: Secondary | ICD-10-CM | POA: Diagnosis not present

## 2024-01-26 DIAGNOSIS — I130 Hypertensive heart and chronic kidney disease with heart failure and stage 1 through stage 4 chronic kidney disease, or unspecified chronic kidney disease: Secondary | ICD-10-CM | POA: Diagnosis not present

## 2024-01-26 DIAGNOSIS — N1831 Chronic kidney disease, stage 3a: Secondary | ICD-10-CM | POA: Diagnosis present

## 2024-01-26 DIAGNOSIS — L97512 Non-pressure chronic ulcer of other part of right foot with fat layer exposed: Secondary | ICD-10-CM | POA: Diagnosis not present

## 2024-01-26 DIAGNOSIS — I6782 Cerebral ischemia: Secondary | ICD-10-CM | POA: Diagnosis not present

## 2024-01-26 DIAGNOSIS — J9611 Chronic respiratory failure with hypoxia: Secondary | ICD-10-CM

## 2024-01-26 DIAGNOSIS — G9341 Metabolic encephalopathy: Secondary | ICD-10-CM | POA: Diagnosis not present

## 2024-01-26 DIAGNOSIS — A401 Sepsis due to streptococcus, group B: Secondary | ICD-10-CM | POA: Clinically undetermined

## 2024-01-26 DIAGNOSIS — J9622 Acute and chronic respiratory failure with hypercapnia: Secondary | ICD-10-CM | POA: Diagnosis present

## 2024-01-26 DIAGNOSIS — S91302A Unspecified open wound, left foot, initial encounter: Secondary | ICD-10-CM | POA: Diagnosis not present

## 2024-01-26 DIAGNOSIS — I4819 Other persistent atrial fibrillation: Secondary | ICD-10-CM | POA: Diagnosis present

## 2024-01-26 DIAGNOSIS — I509 Heart failure, unspecified: Secondary | ICD-10-CM | POA: Diagnosis not present

## 2024-01-26 DIAGNOSIS — R7881 Bacteremia: Secondary | ICD-10-CM | POA: Diagnosis not present

## 2024-01-26 DIAGNOSIS — I482 Chronic atrial fibrillation, unspecified: Secondary | ICD-10-CM | POA: Diagnosis not present

## 2024-01-26 DIAGNOSIS — M1990 Unspecified osteoarthritis, unspecified site: Secondary | ICD-10-CM | POA: Diagnosis present

## 2024-01-26 DIAGNOSIS — B951 Streptococcus, group B, as the cause of diseases classified elsewhere: Secondary | ICD-10-CM | POA: Diagnosis not present

## 2024-01-26 DIAGNOSIS — R42 Dizziness and giddiness: Secondary | ICD-10-CM | POA: Diagnosis not present

## 2024-01-26 DIAGNOSIS — L89622 Pressure ulcer of left heel, stage 2: Secondary | ICD-10-CM | POA: Diagnosis present

## 2024-01-26 DIAGNOSIS — Z8701 Personal history of pneumonia (recurrent): Secondary | ICD-10-CM

## 2024-01-26 DIAGNOSIS — L97522 Non-pressure chronic ulcer of other part of left foot with fat layer exposed: Secondary | ICD-10-CM | POA: Diagnosis present

## 2024-01-26 DIAGNOSIS — B9562 Methicillin resistant Staphylococcus aureus infection as the cause of diseases classified elsewhere: Secondary | ICD-10-CM | POA: Diagnosis present

## 2024-01-26 DIAGNOSIS — I080 Rheumatic disorders of both mitral and aortic valves: Secondary | ICD-10-CM | POA: Diagnosis present

## 2024-01-26 DIAGNOSIS — L03115 Cellulitis of right lower limb: Secondary | ICD-10-CM | POA: Diagnosis present

## 2024-01-26 DIAGNOSIS — I878 Other specified disorders of veins: Secondary | ICD-10-CM | POA: Diagnosis present

## 2024-01-26 DIAGNOSIS — L0390 Cellulitis, unspecified: Secondary | ICD-10-CM | POA: Diagnosis not present

## 2024-01-26 DIAGNOSIS — Z8674 Personal history of sudden cardiac arrest: Secondary | ICD-10-CM

## 2024-01-26 DIAGNOSIS — I4821 Permanent atrial fibrillation: Secondary | ICD-10-CM | POA: Diagnosis not present

## 2024-01-26 DIAGNOSIS — G9340 Encephalopathy, unspecified: Secondary | ICD-10-CM

## 2024-01-26 DIAGNOSIS — I214 Non-ST elevation (NSTEMI) myocardial infarction: Secondary | ICD-10-CM | POA: Diagnosis present

## 2024-01-26 DIAGNOSIS — E1122 Type 2 diabetes mellitus with diabetic chronic kidney disease: Secondary | ICD-10-CM | POA: Diagnosis present

## 2024-01-26 DIAGNOSIS — F05 Delirium due to known physiological condition: Secondary | ICD-10-CM | POA: Diagnosis not present

## 2024-01-26 DIAGNOSIS — B954 Other streptococcus as the cause of diseases classified elsewhere: Secondary | ICD-10-CM | POA: Diagnosis present

## 2024-01-26 DIAGNOSIS — Z6826 Body mass index (BMI) 26.0-26.9, adult: Secondary | ICD-10-CM

## 2024-01-26 DIAGNOSIS — L97519 Non-pressure chronic ulcer of other part of right foot with unspecified severity: Secondary | ICD-10-CM | POA: Diagnosis not present

## 2024-01-26 DIAGNOSIS — Z91148 Patient's other noncompliance with medication regimen for other reason: Secondary | ICD-10-CM

## 2024-01-26 DIAGNOSIS — R54 Age-related physical debility: Secondary | ICD-10-CM | POA: Diagnosis present

## 2024-01-26 DIAGNOSIS — Z7984 Long term (current) use of oral hypoglycemic drugs: Secondary | ICD-10-CM

## 2024-01-26 DIAGNOSIS — M85862 Other specified disorders of bone density and structure, left lower leg: Secondary | ICD-10-CM | POA: Diagnosis not present

## 2024-01-26 DIAGNOSIS — I447 Left bundle-branch block, unspecified: Secondary | ICD-10-CM | POA: Diagnosis present

## 2024-01-26 DIAGNOSIS — S91301A Unspecified open wound, right foot, initial encounter: Secondary | ICD-10-CM | POA: Diagnosis not present

## 2024-01-26 DIAGNOSIS — M1712 Unilateral primary osteoarthritis, left knee: Secondary | ICD-10-CM | POA: Diagnosis not present

## 2024-01-26 DIAGNOSIS — R0989 Other specified symptoms and signs involving the circulatory and respiratory systems: Secondary | ICD-10-CM | POA: Diagnosis not present

## 2024-01-26 DIAGNOSIS — E1129 Type 2 diabetes mellitus with other diabetic kidney complication: Secondary | ICD-10-CM | POA: Diagnosis present

## 2024-01-26 DIAGNOSIS — R079 Chest pain, unspecified: Secondary | ICD-10-CM | POA: Diagnosis present

## 2024-01-26 DIAGNOSIS — I200 Unstable angina: Secondary | ICD-10-CM | POA: Diagnosis not present

## 2024-01-26 DIAGNOSIS — Z888 Allergy status to other drugs, medicaments and biological substances status: Secondary | ICD-10-CM

## 2024-01-26 DIAGNOSIS — L89612 Pressure ulcer of right heel, stage 2: Secondary | ICD-10-CM | POA: Diagnosis present

## 2024-01-26 DIAGNOSIS — L97529 Non-pressure chronic ulcer of other part of left foot with unspecified severity: Secondary | ICD-10-CM | POA: Diagnosis not present

## 2024-01-26 DIAGNOSIS — G8929 Other chronic pain: Secondary | ICD-10-CM | POA: Diagnosis present

## 2024-01-26 DIAGNOSIS — N183 Chronic kidney disease, stage 3 unspecified: Secondary | ICD-10-CM | POA: Diagnosis not present

## 2024-01-26 DIAGNOSIS — I2510 Atherosclerotic heart disease of native coronary artery without angina pectoris: Secondary | ICD-10-CM | POA: Diagnosis present

## 2024-01-26 LAB — CBC WITH DIFFERENTIAL/PLATELET
Abs Immature Granulocytes: 0.09 10*3/uL — ABNORMAL HIGH (ref 0.00–0.07)
Basophils Absolute: 0.1 10*3/uL (ref 0.0–0.1)
Basophils Relative: 0 %
Eosinophils Absolute: 0.1 10*3/uL (ref 0.0–0.5)
Eosinophils Relative: 0 %
HCT: 36.3 % — ABNORMAL LOW (ref 39.0–52.0)
Hemoglobin: 11.5 g/dL — ABNORMAL LOW (ref 13.0–17.0)
Immature Granulocytes: 1 %
Lymphocytes Relative: 3 %
Lymphs Abs: 0.5 10*3/uL — ABNORMAL LOW (ref 0.7–4.0)
MCH: 28 pg (ref 26.0–34.0)
MCHC: 31.7 g/dL (ref 30.0–36.0)
MCV: 88.5 fL (ref 80.0–100.0)
Monocytes Absolute: 1.3 10*3/uL — ABNORMAL HIGH (ref 0.1–1.0)
Monocytes Relative: 7 %
Neutro Abs: 16.2 10*3/uL — ABNORMAL HIGH (ref 1.7–7.7)
Neutrophils Relative %: 89 %
Platelets: 164 10*3/uL (ref 150–400)
RBC: 4.1 MIL/uL — ABNORMAL LOW (ref 4.22–5.81)
RDW: 16.7 % — ABNORMAL HIGH (ref 11.5–15.5)
WBC: 18.2 10*3/uL — ABNORMAL HIGH (ref 4.0–10.5)
nRBC: 0 % (ref 0.0–0.2)

## 2024-01-26 LAB — COMPREHENSIVE METABOLIC PANEL WITH GFR
ALT: 24 U/L (ref 0–44)
AST: 24 U/L (ref 15–41)
Albumin: 3.8 g/dL (ref 3.5–5.0)
Alkaline Phosphatase: 78 U/L (ref 38–126)
Anion gap: 12 (ref 5–15)
BUN: 41 mg/dL — ABNORMAL HIGH (ref 8–23)
CO2: 17 mmol/L — ABNORMAL LOW (ref 22–32)
Calcium: 9.3 mg/dL (ref 8.9–10.3)
Chloride: 108 mmol/L (ref 98–111)
Creatinine, Ser: 1.5 mg/dL — ABNORMAL HIGH (ref 0.61–1.24)
GFR, Estimated: 47 mL/min — ABNORMAL LOW (ref >60)
Glucose, Bld: 189 mg/dL — ABNORMAL HIGH (ref 70–99)
Potassium: 4.2 mmol/L (ref 3.5–5.1)
Sodium: 137 mmol/L (ref 135–145)
Total Bilirubin: 0.8 mg/dL (ref 0.0–1.2)
Total Protein: 7.1 g/dL (ref 6.5–8.1)

## 2024-01-26 LAB — TROPONIN I (HIGH SENSITIVITY)
Troponin I (High Sensitivity): 69 ng/L — ABNORMAL HIGH (ref <18)
Troponin I (High Sensitivity): 105 ng/L (ref <18)

## 2024-01-26 LAB — APTT: aPTT: 31 s (ref 24–36)

## 2024-01-26 MED ORDER — MORPHINE SULFATE (PF) 2 MG/ML IV SOLN
2.0000 mg | INTRAVENOUS | Status: DC | PRN
  Administered 2024-01-27: 2 mg via INTRAVENOUS
  Filled 2024-01-26: qty 1

## 2024-01-26 MED ORDER — HEPARIN BOLUS VIA INFUSION
4000.0000 [IU] | Freq: Once | INTRAVENOUS | Status: AC
  Administered 2024-01-27: 4000 [IU] via INTRAVENOUS
  Filled 2024-01-26: qty 4000

## 2024-01-26 MED ORDER — GLIPIZIDE 10 MG PO TABS
10.0000 mg | ORAL_TABLET | Freq: Two times a day (BID) | ORAL | Status: DC
  Administered 2024-01-27: 10 mg via ORAL
  Filled 2024-01-26 (×3): qty 1

## 2024-01-26 MED ORDER — CLOPIDOGREL BISULFATE 75 MG PO TABS
75.0000 mg | ORAL_TABLET | Freq: Every day | ORAL | Status: DC
  Administered 2024-01-27 – 2024-01-31 (×5): 75 mg via ORAL
  Filled 2024-01-26 (×5): qty 1

## 2024-01-26 MED ORDER — ADULT MULTIVITAMIN W/MINERALS CH
1.0000 | ORAL_TABLET | Freq: Every day | ORAL | Status: DC
Start: 2024-01-27 — End: 2024-02-07
  Administered 2024-01-27 – 2024-02-06 (×11): 1 via ORAL
  Filled 2024-01-26 (×11): qty 1

## 2024-01-26 MED ORDER — TRAZODONE HCL 50 MG PO TABS
25.0000 mg | ORAL_TABLET | Freq: Every evening | ORAL | Status: DC | PRN
  Administered 2024-01-29 – 2024-01-30 (×2): 25 mg via ORAL
  Filled 2024-01-26 (×2): qty 1

## 2024-01-26 MED ORDER — ASPIRIN 81 MG PO TBEC
81.0000 mg | DELAYED_RELEASE_TABLET | Freq: Every day | ORAL | Status: DC
  Administered 2024-01-27 – 2024-01-30 (×4): 81 mg via ORAL
  Filled 2024-01-26 (×6): qty 1

## 2024-01-26 MED ORDER — FUROSEMIDE 40 MG PO TABS: 40.0000 mg | ORAL_TABLET | Freq: Two times a day (BID) | ORAL | Status: DC

## 2024-01-26 MED ORDER — ATORVASTATIN CALCIUM 20 MG PO TABS: 10.0000 mg | ORAL_TABLET | Freq: Every day | ORAL | Status: DC

## 2024-01-26 MED ORDER — FINERENONE 10 MG PO TABS
10.0000 mg | ORAL_TABLET | Freq: Every day | ORAL | Status: DC
Start: 2024-01-27 — End: 2024-01-27

## 2024-01-26 MED ORDER — LOSARTAN POTASSIUM 50 MG PO TABS
25.0000 mg | ORAL_TABLET | Freq: Every day | ORAL | Status: DC
  Filled 2024-01-26: qty 1

## 2024-01-26 MED ORDER — ONDANSETRON HCL 4 MG/2ML IJ SOLN: 4.0000 mg | Freq: Four times a day (QID) | INTRAMUSCULAR | Status: DC | PRN

## 2024-01-26 MED ORDER — ONDANSETRON HCL 4 MG PO TABS: 4.0000 mg | ORAL_TABLET | Freq: Four times a day (QID) | ORAL | Status: DC | PRN

## 2024-01-26 MED ORDER — DOXYCYCLINE HYCLATE 100 MG PO TABS: 100.0000 mg | ORAL_TABLET | Freq: Two times a day (BID) | ORAL | Status: DC

## 2024-01-26 MED ORDER — NITROGLYCERIN 0.4 MG SL SUBL
0.4000 mg | SUBLINGUAL_TABLET | SUBLINGUAL | Status: DC | PRN
  Administered 2024-01-27 (×3): 0.4 mg via SUBLINGUAL
  Filled 2024-01-26 (×2): qty 1

## 2024-01-26 MED ORDER — BUMETANIDE 1 MG PO TABS
2.0000 mg | ORAL_TABLET | Freq: Every day | ORAL | Status: DC
  Filled 2024-01-26: qty 2

## 2024-01-26 MED ORDER — SILVER SULFADIAZINE 1 % EX CREA
TOPICAL_CREAM | Freq: Every day | CUTANEOUS | Status: DC
  Filled 2024-01-26 (×2): qty 85

## 2024-01-26 MED ORDER — MAGNESIUM HYDROXIDE 400 MG/5ML PO SUSP
30.0000 mL | Freq: Every day | ORAL | Status: DC | PRN
  Administered 2024-02-01: 30 mL via ORAL
  Filled 2024-01-26: qty 30

## 2024-01-26 MED ORDER — ACETAMINOPHEN 325 MG PO TABS
650.0000 mg | ORAL_TABLET | Freq: Four times a day (QID) | ORAL | Status: DC | PRN
  Administered 2024-01-29 – 2024-02-05 (×7): 650 mg via ORAL
  Filled 2024-01-26 (×8): qty 2

## 2024-01-26 MED ORDER — METOLAZONE 2.5 MG PO TABS
2.5000 mg | ORAL_TABLET | ORAL | Status: DC
  Administered 2024-01-27: 2.5 mg via ORAL
  Filled 2024-01-26: qty 1

## 2024-01-26 MED ORDER — HEPARIN (PORCINE) 25000 UT/250ML-% IV SOLN
2300.0000 [IU]/h | INTRAVENOUS | Status: DC
  Administered 2024-01-27 (×2): 1750 [IU]/h via INTRAVENOUS
  Administered 2024-01-27: 1500 [IU]/h via INTRAVENOUS
  Administered 2024-01-28: 2300 [IU]/h via INTRAVENOUS
  Administered 2024-01-28: 2000 [IU]/h via INTRAVENOUS
  Administered 2024-01-29 – 2024-02-05 (×14): 2300 [IU]/h via INTRAVENOUS
  Filled 2024-01-26 (×20): qty 250

## 2024-01-26 MED ORDER — SODIUM CHLORIDE 0.9 % IV SOLN: INTRAVENOUS | Status: DC

## 2024-01-26 MED ORDER — GENTAMICIN SULFATE 0.1 % EX CREA
1.0000 | TOPICAL_CREAM | Freq: Two times a day (BID) | CUTANEOUS | Status: DC
  Administered 2024-01-28 – 2024-02-07 (×16): 1 via TOPICAL
  Filled 2024-01-26 (×2): qty 15

## 2024-01-26 MED ORDER — ACETAMINOPHEN 650 MG RE SUPP
650.0000 mg | Freq: Four times a day (QID) | RECTAL | Status: DC | PRN
  Filled 2024-01-26: qty 1

## 2024-01-26 NOTE — ED Triage Notes (Signed)
 Complaints of SOB and chest pain however Pt states he always has chest pain. Today was just a little worse.  No other complaints.  BIBA:  lungs clear, chest was a little sweaty.  Pt stated he took some aspirin  earlier so they didn't give him any.  IV 20 right hand On 2L due to Capnography  HR 112 O2 96 on 2L 138/82 T 98.6 Cap 23 RR 30 BG 200

## 2024-01-26 NOTE — Assessment & Plan Note (Signed)
>>  ASSESSMENT AND PLAN FOR CHEST PAIN WRITTEN ON 01/27/2024  3:39 PM BY AMIN, SUMAYYA, MD  Concern of NSTEMI, type I versus type II Increasing troponin, currently at 714.  Continue to have some chest discomfort. Cardiology is on board -Continue with heparin  infusion - Likely will get ischemic workup after diuresing - Continue with aspirin , Plavix  and atorvastatin

## 2024-01-26 NOTE — ED Provider Notes (Signed)
 [facility] Provider Note    Event Date/Time   First MD Initiated Contact with Patient 01/26/24 1923     (approximate)   History   Chief Complaint: Shortness of Breath   HPI  Tyler [name] is a 78 y.o. male with a history of CHF, diabetes, CKD, atrial fibrillation who comes to the ED complaining of feeling of dizziness and confusion that started around 4:00 PM today when he got home from work.  He sat down and after about 30 minutes he felt better.  Denies headache or fall or trauma.  No weakness.  He endorses chronic chest pain and shortness of breath but states this is chronic baseline for him.  Denies any exertional symptoms.  No cough, but does feel like he is developing a fever        Past Medical History:  Diagnosis Date   (HFpEF) heart failure with preserved ejection fraction (HCC) 03/01/2020   a.) TTE 03/01/2020: EF 55-60%, mod MAC, mod AoV sclerosis, triv AR, mild TR, mod MR, RVSP 50-59; b.) TTE 09/10/2020: EF 55-60%, mod MAC, mod AoV sclerosis, mild TR, 3+ MR, RVSP 50-59; c.) TTE 09/26/2020: EF 55-60%, mild LA dil, triv PR, mild MR/TR, RVSP 37-49   Adenoma of left adrenal gland    Anemia    Arthritis    Atrial fibrillation and flutter (HCC)    a.) CHA2DS2VASc = 5 (age x2, HFpEF, HTN, T2DM);  b.) s/p CTI ablation 09/07/2020; c.) rate/rhythm maintained on oral carvedilol ; not on chronic anticoagulation therapy   CAD (coronary artery disease)    Cardiomegaly    CKD (chronic kidney disease), stage III (HCC)    DOE (dyspnea on exertion)    Drug-induced bradycardia    Gangrene of toe of left foot (HCC)    a.) 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    HLD (hyperlipidemia)    Hypertension    Long term current use of aspirin     Lymphedema of both lower extremities    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)    a.) s/p amputation 11/04/2022   Peripheral vascular disease  (HCC)    Pleural effusion on right 09/09/2020   a.) s/p RIGHT thoracentesis with 2180 cc yield   Pneumonia    Presence of permanent cardiac pacemaker 09/10/2020   a.) TVP placement 09/10/2020 due to intermittent CHB in setting of urosepsis; b.) s/p PPM placement 09/15/2020: MDT Azure XT DR (SN: [id number])   Pulmonary hypertension (HCC) 03/01/2020   a.) TTE: 03/01/2020: RVSP 50-59; b.) TTE 09/10/2020: RVSP 50-59; c.) TTE 09/26/2020: RVSP 37-49   RA (rheumatoid arthritis) (HCC)    Rheumatic fever    Sepsis (HCC) 09/10/2020   a.) urosepsis --> BC x 2 sets and UC all grew out significant Proteus mirabilis; admitted to [facility] 09/07/2020 - 09/27/2020.   Sick sinus syndrome [facility])    a.) s/p MDT PPM placement 09/15/2020   T2DM (type 2 diabetes mellitus) (HCC)    Urinary retention    chronic, with indwelling Foley catheter and plans for a suprapubic   Wears dentures    full upper    Current Outpatient Rx   Order #: [id number] Class: OTC   Order #: [id number] Class: Normal   Order #: [id number] Class: Normal   Order #: [id number] Class: Normal   Order #: [id number] Class: Normal   Order #: [id number] Class: Normal   Order #: [id number] Class: Normal  Order #: [id number] Class: Historical Med   Order #: [id number] Class: Normal   Order #: [id number] Class: Normal   Order #: [id number] Class: Historical Med   Order #: [id number] Class: Normal   Order #: [id number] Class: No Print   Order #: [id number] Class: Historical Med   Order #: [id number] Class: Normal    Past Surgical History:  Procedure Laterality Date   AMPUTATION TOE Right 11/04/2022   Procedure: AMPUTATION TOE;  Surgeon: Dot Gazella, [name];  [name];  Service: Podiatry;  Laterality: Right;   AMPUTATION TOE Right 09/15/2023   Procedure: AMPUTATION TOE;  Surgeon: Jennefer Moats, [name];  [name];  Service: Orthopedics/Podiatry;  Laterality: Right;  Right hallux amputation   BONE BIOPSY Right 04/05/2023   Procedure:  BONE BIOPSY THIRD & FOURTH;  Surgeon: Dot Gazella, [name];  [name];  Service: Podiatry;  Laterality: Right;   CARDIAC ELECTROPHYSIOLOGY STUDY AND ABLATION N/A 09/07/2020   Procedure: CARDIAC EP STUDY AND ABLATION (CTI)   CATARACT EXTRACTION W/PHACO Right 01/16/2017   Procedure: CATARACT EXTRACTION PHACO AND INTRAOCULAR LENS PLACEMENT (IOC)  Right Complicated;  Surgeon: Annell Kidney, MD;  Location: [facility];  Service: Ophthalmology;  Laterality: Right;  IVA Block Healon 5 malyugin vision blue Diabetic - oral meds   CATARACT EXTRACTION W/PHACO Left 10/23/2022   Procedure: CATARACT EXTRACTION PHACO AND INTRAOCULAR LENS PLACEMENT (IOC) LEFT DIABETIC;  Surgeon: Clair Crews, MD;  Location: [facility];  Service: Ophthalmology;  Laterality: Left;  Diabetic   COLONOSCOPY     INCISION AND DRAINAGE OF WOUND Left 09/15/2023   Procedure: IRRIGATION AND DEBRIDEMENT WOUND;  Surgeon: Jennefer Moats, [name];  [name];  Service: Orthopedics/Podiatry;  Laterality: Left;  Left foot wound debridement, possible graft/biopsy   LOWER EXTREMITY ANGIOGRAPHY Right 11/02/2022   Procedure: Lower Extremity Angiography;  Surgeon: Celso College, MD;  Location: [facility] INVASIVE CV LAB;  Service: Cardiovascular;  Laterality: Right;   LOWER EXTREMITY ANGIOGRAPHY Right 09/13/2023   Procedure: Lower Extremity Angiography;  Surgeon: Jackquelyn Mass, MD;  Location: [facility];  Service: Cardiovascular;  Laterality: Right;   METATARSAL HEAD EXCISION Left 07/05/2018   Procedure: RESECTION FIRST METATARSAL INFECTED BONE AND SOFT TISSUE;  Surgeon: Sharlyn Deaner, [name];  [name];  Service: Podiatry;  Laterality: Left;   METATARSAL HEAD EXCISION Right 04/05/2023   Procedure: METATARSAL HEAD EXCISION THIRD & FOURTH;  Surgeon: Dot Gazella, [name];  [name];  Service: Podiatry;  Laterality: Right;   PACEMAKER INSERTION  09/15/2020   TOE AMPUTATION Left     TONSILLECTOMY      Physical Exam   Triage Vital Signs: ED Triage Vitals  Encounter Vitals Group     BP 01/26/24 1925 131/72     Systolic BP Percentile --      Diastolic BP Percentile --      Pulse Rate 01/26/24 1925 (!) 112     Resp 01/26/24 1925 (!) 24     Temp 01/26/24 1925 99.3 F (37.4 C)     Temp Source 01/26/24 1925 Oral     SpO2 01/26/24 1925 97 %     Weight 01/26/24 1926 260 lb 12.8 oz (118.3 kg)     Height 01/26/24 1926 6\' 6"  (1.981 m)     Head Circumference --      Peak Flow --      Pain Score 01/26/24 1926 4     Pain Loc --      Pain Education --  Exclude from Growth Chart --     Most recent vital signs: Vitals:   01/26/24 1925 01/26/24 2100  BP: 131/72 (!) 144/77  Pulse: (!) 112 (!) 104  Resp: (!) 24 (!) 32  Temp: 99.3 F (37.4 C)   SpO2: 97% 100%    General: Awake, no distress.  CV:  Good peripheral perfusion.  Atrial fibrillation, rate 110.  Normal distal pulses Resp:  Normal effort.  Diminished breath sounds at the left lower lung.  Otherwise good air movement, no wheezing Abd:  No distention.  Soft nontender Other:  1+ pitting edema bilateral lower extremities.   ED Results / Procedures / Treatments   Labs (all labs ordered are listed, but only abnormal results are displayed) Labs Reviewed  COMPREHENSIVE METABOLIC PANEL WITH GFR - Abnormal; Notable for the following components:      Result Value   CO2 17 (*)    Glucose, Bld 189 (*)    BUN 41 (*)    Creatinine, Ser 1.50 (*)    GFR, Estimated 47 (*)    All other components within normal limits  CBC WITH DIFFERENTIAL/PLATELET - Abnormal; Notable for the following components:   WBC 18.2 (*)    RBC 4.10 (*)    Hemoglobin 11.5 (*)    HCT 36.3 (*)    RDW 16.7 (*)    Neutro Abs 16.2 (*)    Lymphs Abs 0.5 (*)    Monocytes Absolute 1.3 (*)    Abs Immature Granulocytes 0.09 (*)    All other components within normal limits  TROPONIN I (HIGH SENSITIVITY) - Abnormal; Notable for the following  components:   Troponin I (High Sensitivity) 69 (*)    All other components within normal limits  TROPONIN I (HIGH SENSITIVITY) - Abnormal; Notable for the following components:   Troponin I (High Sensitivity) 105 (*)    All other components within normal limits  APTT     EKG Interpreted by me Atrial fibrillation, rate 112.  Left axis, left bundle branch block, no acute ischemic changes.  2 PVCs on the strip.  No significant change compared to prior EKG on September 11, 2023.   RADIOLOGY Chest x-ray interpreted by me, no consolidation, effusion, or pneumothorax.  Radiology report reviewed   PROCEDURES:  .Critical Care  Performed by: Jacquie Maudlin, MD Authorized by: Jacquie Maudlin, MD   Critical care provider statement:    Critical care time (minutes):  35   Critical care time was exclusive of:  Separately billable procedures and treating other patients   Critical care was necessary to treat or prevent imminent or life-threatening deterioration of the following conditions:  Cardiac failure   Critical care was time spent personally by me on the following activities:  Development of treatment plan with patient or surrogate, discussions with consultants, evaluation of patient's response to treatment, examination of patient, obtaining history from patient or surrogate, ordering and performing treatments and interventions, ordering and review of laboratory studies, ordering and review of radiographic studies, pulse oximetry, re-evaluation of patient's condition and review of old charts   Care discussed with: admitting provider      MEDICATIONS ORDERED IN ED: Medications - No data to display   IMPRESSION / MDM / ASSESSMENT AND PLAN / ED COURSE  I reviewed the triage vital signs and the nursing notes.  DDx: Non-STEMI, dehydration, electrolyte derangement, anemia, vagal episode, TIA  Patient's presentation is most consistent with acute presentation with potential threat to life  or bodily function.  Patient presents with 30-minute episode of dizziness and confusion which seems to have resolved currently.  Denies any recent illness, no vomiting or diarrhea or acute pain.  Will check labs and chest x-ray.   ----------------------------------------- 10:43 PM on 01/26/2024 ----------------------------------------- Troponin uptrending, 69 to 105 from baseline of 20.  Concern for NSTEMI.  Will start heparin .  Case discussed with hospitalist.      FINAL CLINICAL IMPRESSION(S) / ED DIAGNOSES   Final diagnoses:  NSTEMI (non-ST elevated myocardial infarction) (HCC)  Chronic respiratory failure with hypoxia (HCC)     Rx / DC Orders   ED Discharge Orders     None        Note:  This document was prepared using Dragon voice recognition software and may include unintentional dictation errors.   Jacquie Maudlin, MD 01/26/24 2244

## 2024-01-26 NOTE — Assessment & Plan Note (Addendum)
 Concern of NSTEMI, type I versus type II Increasing troponin, currently at 714.  Continue to have some chest discomfort. Cardiology is on board -Continue with heparin  infusion - Likely will get ischemic workup after diuresing - Continue with aspirin , Plavix  and atorvastatin 

## 2024-01-26 NOTE — Assessment & Plan Note (Signed)
Will continue statin therapy and check fasting lipids.

## 2024-01-26 NOTE — ED Triage Notes (Signed)
 Pt said he also felt dizzy and got confused.  He thought it was his sugar level.  Said he ate earlier at work but had not eaten after work.

## 2024-01-26 NOTE — H&P (Incomplete)
 [place]   PATIENT NAME: Tyler [name]    MR#:  [id number]  DATE OF BIRTH:  [birth year]  DATE OF ADMISSION:  01/26/2024  PRIMARY CARE PHYSICIAN: McClanahan, Kyra, NP   Patient is coming from: Home  REQUESTING/REFERRING PHYSICIAN: Ysidro Her, MD  CHIEF COMPLAINT:   Chief Complaint  Patient presents with   Shortness of Breath    HISTORY OF PRESENT ILLNESS:  Tyler [name] is a 78 y.o. Caucasian male with medical history significant for HFpEF, osteoarthritis, atrial fibrillation and flutter, coronary artery disease, stage III chronic kidney disease, dyslipidemia and hypertension, as well as type 2 diabetes mellitus, who presented to the emergency room with a cancer of dizziness, lightheadedness and confusion that started around 4 PM today when he came back home from work.  This was associated with chest pain and dyspnea.  His symptoms later resolved spontaneously.  He denies any radiation of his pain, or associated nausea or vomiting or diaphoresis or palpitations.  He feels tactile fever without chills.  No cough or wheezing or vomitus.  No leg pain or edema recent travel or surgery.  No dysuria, oliguria or hematuria or flank pain.  No bleeding diathesis.  ED Course: When the patient came to the ER, BMP revealed BUN of 41 creatinine 1.5 compared to 37 and 1.36 on 12/04/2023" 189 with CO2 of 17 and otherwise CMP was unremarkable.  High-sensitivity troponin was 69 and later 105.  CBC showed leukocytosis of 18.2 with neutrophilia and mild anemia.. EKG as reviewed by me : Atrial fibrillation with RVR of 112 and PVCs with left bundle branch block. Imaging: Noncontrast head CT scan showed mild diffuse atrophy and mild chronic small vessel ischemic changes with no acute intracranial process.  Portable chest x-ray showed cardiomegaly with mild central congestion.  The patient was started on IV heparin  per ACS protocol.  He will be admitted to a cardiac telemetry observation bed for  further evaluation and management. PAST MEDICAL HISTORY:   Past Medical History:  Diagnosis Date   (HFpEF) heart failure with preserved ejection fraction (HCC) 03/01/2020   a.) TTE 03/01/2020: EF 55-60%, mod MAC, mod AoV sclerosis, triv AR, mild TR, mod MR, RVSP 50-59; b.) TTE 09/10/2020: EF 55-60%, mod MAC, mod AoV sclerosis, mild TR, 3+ MR, RVSP 50-59; c.) TTE 09/26/2020: EF 55-60%, mild LA dil, triv PR, mild MR/TR, RVSP 37-49   Adenoma of left adrenal gland    Anemia    Arthritis    Atrial fibrillation and flutter (HCC)    a.) CHA2DS2VASc = 5 (age x2, HFpEF, HTN, T2DM);  b.) s/p CTI ablation 09/07/2020; c.) rate/rhythm maintained on oral carvedilol ; not on chronic anticoagulation therapy   CAD (coronary artery disease)    Cardiomegaly    CKD (chronic kidney disease), stage III (HCC)    DOE (dyspnea on exertion)    Drug-induced bradycardia    Gangrene of toe of left foot (HCC)    a.) 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    HLD (hyperlipidemia)    Hypertension    Long term current use of aspirin     Lymphedema of both lower extremities    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)    a.) s/p amputation 11/04/2022   Peripheral vascular disease (HCC)    Pleural effusion on right 09/09/2020   a.) s/p RIGHT thoracentesis with 2180 cc yield   Pneumonia  Presence of permanent cardiac pacemaker 09/10/2020   a.) TVP placement 09/10/2020 due to intermittent CHB in setting of urosepsis; b.) s/p PPM placement 09/15/2020: MDT Azure XT DR (SN: [id number])   Pulmonary hypertension (HCC) 03/01/2020   a.) TTE: 03/01/2020: RVSP 50-59; b.) TTE 09/10/2020: RVSP 50-59; c.) TTE 09/26/2020: RVSP 37-49   RA (rheumatoid arthritis) (HCC)    Rheumatic fever    Sepsis (HCC) 09/10/2020   a.) urosepsis --> BC x 2 sets and UC all grew out significant Proteus mirabilis; admitted to [facility] 09/07/2020 - 09/27/2020.    Sick sinus syndrome [facility])    a.) s/p MDT PPM placement 09/15/2020   T2DM (type 2 diabetes mellitus) (HCC)    Urinary retention    chronic, with indwelling Foley catheter and plans for a suprapubic   Wears dentures    full upper    PAST SURGICAL HISTORY:   Past Surgical History:  Procedure Laterality Date   AMPUTATION TOE Right 11/04/2022   Procedure: AMPUTATION TOE;  Surgeon: Dot Gazella, [name];  [name];  Service: Podiatry;  Laterality: Right;   AMPUTATION TOE Right 09/15/2023   Procedure: AMPUTATION TOE;  Surgeon: Jennefer Moats, [name];  [name];  Service: Orthopedics/Podiatry;  Laterality: Right;  Right hallux amputation   BONE BIOPSY Right 04/05/2023   Procedure: BONE BIOPSY THIRD & FOURTH;  Surgeon: Dot Gazella, [name];  [name];  Service: Podiatry;  Laterality: Right;   CARDIAC ELECTROPHYSIOLOGY STUDY AND ABLATION N/A 09/07/2020   Procedure: CARDIAC EP STUDY AND ABLATION (CTI)   CATARACT EXTRACTION W/PHACO Right 01/16/2017   Procedure: CATARACT EXTRACTION PHACO AND INTRAOCULAR LENS PLACEMENT (IOC)  Right Complicated;  Surgeon: Annell Kidney, MD;  Location: [facility];  Service: Ophthalmology;  Laterality: Right;  IVA Block Healon 5 malyugin vision blue Diabetic - oral meds   CATARACT EXTRACTION W/PHACO Left 10/23/2022   Procedure: CATARACT EXTRACTION PHACO AND INTRAOCULAR LENS PLACEMENT (IOC) LEFT DIABETIC;  Surgeon: Clair Crews, MD;  Location: [facility];  Service: Ophthalmology;  Laterality: Left;  Diabetic   COLONOSCOPY     INCISION AND DRAINAGE OF WOUND Left 09/15/2023   Procedure: IRRIGATION AND DEBRIDEMENT WOUND;  Surgeon: Jennefer Moats, [name];  [name];  Service: Orthopedics/Podiatry;  Laterality: Left;  Left foot wound debridement, possible graft/biopsy   LOWER EXTREMITY ANGIOGRAPHY Right 11/02/2022   Procedure: Lower Extremity Angiography;  Surgeon: Celso College, MD;  Location: [facility];   Service: Cardiovascular;  Laterality: Right;   LOWER EXTREMITY ANGIOGRAPHY Right 09/13/2023   Procedure: Lower Extremity Angiography;  Surgeon: Jackquelyn Mass, MD;  Location: [facility] IN[facility];  Service: Cardiovascular;  Laterality: Right;   METATARSAL HEAD EXCISION Left 07/05/2018   Procedure: RESECTION FIRST METATARSAL INFECTED BONE AND SOFT TISSUE;  Surgeon: Sharlyn Deaner, [name];  [name];  Service: Podiatry;  Laterality: Left;   METATARSAL HEAD EXCISION Right 04/05/2023   Procedure: METATARSAL HEAD EXCISION THIRD & FOURTH;  Surgeon: Dot Gazella, [name];  [name];  Service: Podiatry;  Laterality: Right;   PACEMAKER INSERTION  09/15/2020   TOE AMPUTATION Left    TONSILLECTOMY      SOCIAL HISTORY:   Social History   Tobacco Use   Smoking status: Never   Smokeless tobacco: Never  Substance Use Topics   Alcohol use: Not Currently    Comment: rarely    FAMILY HISTORY:   Family History  Problem Relation Age of Onset   Cancer Niece  DRUG ALLERGIES:   Allergies  Allergen Reactions   Eliquis  [Apixaban ]     Dizziness  and vision change   Spironolactone  Other (See Comments)    dizziness    REVIEW OF SYSTEMS:   ROS As per history of present illness. All pertinent systems were reviewed above. Constitutional, HEENT, cardiovascular, respiratory, GI, GU, musculoskeletal, neuro, psychiatric, endocrine, integumentary and hematologic systems were reviewed and are otherwise negative/unremarkable except for positive findings mentioned above in the HPI.   MEDICATIONS AT HOME:   Prior to Admission medications   Medication Sig Start Date End Date Taking? Authorizing Provider  acetaminophen  (TYLENOL ) 500 MG tablet Take 2 tablets (1,000 mg total) by mouth 3 (three) times daily as needed (for pain). 08/18/23   Garrison Kanner, MD  aspirin  EC 81 MG tablet Take 1 tablet (81 mg total) by mouth daily. Swallow whole. 11/07/22   Alexander, Natalie, DO  atorvastatin   (LIPITOR) 10 MG tablet Take 1 tablet (10 mg total) by mouth daily at 6 PM. 10/17/23 12/16/23  Charlette Console, FNP  bumetanide  (BUMEX ) 2 MG tablet Take 1 tablet (2 mg total) by mouth daily. 09/11/23   Charlette Console, FNP  clopidogrel  (PLAVIX ) 75 MG tablet Take 1 tablet (75 mg total) by mouth daily with breakfast. 10/17/23 10/11/24  Charlette Console, FNP  doxycycline  (VIBRA -TABS) 100 MG tablet Take 1 tablet (100 mg total) by mouth 2 (two) times daily. 12/31/23   Dot Gazella, [name]  Finerenone  (KERENDIA ) 10 MG TABS Take 1 tablet (10 mg total) by mouth daily. 11/01/23   Charlette Console, FNP  furosemide  (LASIX ) 40 MG tablet Take 40 mg by mouth 2 (two) times daily. 10/09/23   [provider]  gentamicin  cream (GARAMYCIN ) 0.1 % Apply 1 Application topically 2 (two) times daily. 05/31/23   Dot Gazella, [name]  glipiZIDE  (GLUCOTROL ) 10 MG tablet Take 1 tablet (10 mg total) by mouth 2 (two) times daily. 08/18/23   Garrison Kanner, MD  losartan  (COZAAR ) 25 MG tablet Take 25 mg by mouth daily.    [provider]  metFORMIN  (GLUCOPHAGE ) 1000 MG tablet Take 1 tablet (1,000 mg total) by mouth 2 (two) times daily. 08/18/23   Garrison Kanner, MD  metolazone  (ZAROXOLYN ) 2.5 MG tablet Take 1 tablet (2.5 mg total) by mouth 2 (two) times a week. On Mondays & Fridays 09/12/23   Sheril Dines, MD  Multiple Vitamins-Minerals (MULTIVITAMIN WITH MINERALS) tablet Take 1 tablet by mouth daily.    [provider]  silver  sulfADIAZINE  (SILVADENE ) 1 % cream Apply to affected area daily 06/25/23 06/24/24  Dot Gazella, [name]      VITAL SIGNS:  Blood pressure (!) 144/77, pulse (!) 104, temperature 99.3 F (37.4 C), temperature source Oral, resp. rate (!) 32, height 6\' 6"  (1.981 m), weight 118.3 kg, SpO2 100%.  PHYSICAL EXAMINATION:  Physical Exam  GENERAL:  78 y.o.-year-old Caucasian male patient lying in the bed with no acute distress.  EYES: Pupils equal, round, reactive to light and accommodation. No scleral icterus.  Extraocular muscles intact.  HEENT: Head atraumatic, normocephalic. Oropharynx and nasopharynx clear.  NECK:  Supple, no jugular venous distention. No thyroid enlargement, no tenderness.  LUNGS: Normal breath sounds bilaterally, no wheezing, rales,rhonchi or crepitation. No use of accessory muscles of respiration.  CARDIOVASCULAR: Regular rate and rhythm, S1, S2 normal. No murmurs, rubs, or gallops.  ABDOMEN: Soft, nondistended, nontender. Bowel sounds present. No organomegaly or mass.  EXTREMITIES: No pedal edema, cyanosis, or clubbing.  NEUROLOGIC:  Cranial nerves II through XII are intact. Muscle strength 5/5 in all extremities. Sensation intact. Gait not checked.  PSYCHIATRIC: The patient is alert and oriented x 3.  Normal affect and good eye contact. SKIN: No obvious rash, lesion, or ulcer.   LABORATORY PANEL:   CBC Recent Labs  Lab 01/26/24 1936  WBC 18.2*  HGB 11.5*  HCT 36.3*  PLT 164   ------------------------------------------------------------------------------------------------------------------  Chemistries  Recent Labs  Lab 01/26/24 1936  NA 137  K 4.2  CL 108  CO2 17*  GLUCOSE 189*  BUN 41*  CREATININE 1.50*  CALCIUM  9.3  AST 24  ALT 24  ALKPHOS 78  BILITOT 0.8   ------------------------------------------------------------------------------------------------------------------  Cardiac Enzymes No results for input(s): "TROPONINI" in the last 168 hours. ------------------------------------------------------------------------------------------------------------------  RADIOLOGY:  CT Head Wo Contrast Result Date: 01/26/2024 CLINICAL DATA:  Mental status change. EXAM: CT HEAD WITHOUT CONTRAST TECHNIQUE: Contiguous axial images were obtained from the base of the skull through the vertex without intravenous contrast. RADIATION DOSE REDUCTION: This exam was performed according to the departmental dose-optimization program which includes automated exposure  control, adjustment of the mA and/or kV according to patient size and/or use of iterative reconstruction technique. COMPARISON:  Head CT 07/04/2018. FINDINGS: Brain: No evidence of acute infarction, hemorrhage, hydrocephalus, extra-axial collection or mass lesion/mass effect. There is mild diffuse atrophy and mild periventricular white matter hypodensity which is similar to the prior study. Vascular: Atherosclerotic calcifications are present within the cavernous internal carotid arteries. Skull: Normal. Negative for fracture or focal lesion. Sinuses/Orbits: No acute finding. Other: None. IMPRESSION: 1. No acute intracranial process. 2. Mild diffuse atrophy and mild chronic small vessel ischemic changes. Electronically Signed   By: Tyron Gallon M.D.   On: 01/26/2024 20:18   DG Chest Portable 1 View Result Date: 01/26/2024 CLINICAL DATA:  Chest pain EXAM: PORTABLE CHEST 1 VIEW COMPARISON:  09/11/2023 FINDINGS: Left-sided pacing device similar in position. Cardiomegaly with mild central congestion. No consolidation, pleural effusion or pneumothorax. IMPRESSION: Cardiomegaly with mild central congestion. Electronically Signed   By: Esmeralda Hedge M.D.   On: 01/26/2024 20:14      IMPRESSION AND PLAN:  Assessment and Plan: * Chest pain - The patient has elevated troponin I concerning for developing ACS/non-STEMI. - The patient will be admitted to an observation cardiac telemetry bed. - Will follow serial troponins and EKGs. - The patient will be placed on aspirin  and Plavix  as well as p.r.n. sublingual nitroglycerin  and morphine sulfate for pain. - We will obtain a cardiology consult in a.m. for further cardiac risk stratification. - I notified Dr. Parks Bollman about the patient.    Type II diabetes mellitus with renal manifestations (HCC) - Will place the patient on supplement coverage with NovoLog . - Will continue glipizide  and hold off metformin .  Essential hypertension - Will continue  antihypertensive therapy.  Dyslipidemia - Will continue statin therapy and check fasting lipids.   DVT prophylaxis: IV heparin  Advanced Care Planning:  Code Status: full code. Family Communication:  The plan of care was discussed in details with the patient (and family). I answered all questions. The patient agreed to proceed with the above mentioned plan. Further management will depend upon hospital course. Disposition Plan: Back to previous home environment Consults called: Cardiology All the records are reviewed and case discussed with ED provider.  Status is: Observation  I certify that at the time of admission, it is my clinical judgment that the patient will require [hospital care extending less than 2 midnights.  Dispo: The patient is from: Home              Anticipated d/c is to: Home              Patient currently is not medically stable to d/c.              Difficult to place patient: No  Virgene Griffin M.D on 01/26/2024 at 11:17 PM  [facility]   From 7 PM-7 AM, contact night-coverage [email or web address]  CC: Primary care physician; McClanahan, Kyra, NP

## 2024-01-26 NOTE — Progress Notes (Signed)
 ANTICOAGULATION CONSULT NOTE  Pharmacy Consult for heparin  infusion Indication: ACS/STEMI  Allergies  Allergen Reactions   Eliquis  [Apixaban ]     Dizziness  and vision change   Spironolactone  Other (See Comments)    dizziness    Patient Measurements: Height: 6\' 6"  (198.1 cm) Weight: 118.3 kg (260 lb 12.8 oz) IBW/kg (Calculated) : 91.4 Heparin  Dosing Weight: 115.5 kg  Vital Signs: Temp: 99.3 F (37.4 C) (05/04 1925) Temp Source: Oral (05/04 1925) BP: 144/77 (05/04 2100) Pulse Rate: 104 (05/04 2100)  Labs: Recent Labs    01/26/24 1936 01/26/24 2133  HGB 11.5*  --   HCT 36.3*  --   PLT 164  --   CREATININE 1.50*  --   TROPONINIHS 69* 105*    Estimated Creatinine Clearance: 57.7 mL/min (A) (by C-G formula based on SCr of 1.5 mg/dL (H)).   Medical History: Past Medical History:  Diagnosis Date   (HFpEF) heart failure with preserved ejection fraction (HCC) 03/01/2020   a.) TTE 03/01/2020: EF 55-60%, mod MAC, mod AoV sclerosis, triv AR, mild TR, mod MR, RVSP 50-59; b.) TTE 09/10/2020: EF 55-60%, mod MAC, mod AoV sclerosis, mild TR, 3+ MR, RVSP 50-59; c.) TTE 09/26/2020: EF 55-60%, mild LA dil, triv PR, mild MR/TR, RVSP 37-49   Adenoma of left adrenal gland    Anemia    Arthritis    Atrial fibrillation and flutter (HCC)    a.) CHA2DS2VASc = 5 (age x2, HFpEF, HTN, T2DM);  b.) s/p CTI ablation 09/07/2020; c.) rate/rhythm maintained on oral carvedilol ; not on chronic anticoagulation therapy   CAD (coronary artery disease)    Cardiomegaly    CKD (chronic kidney disease), stage III (HCC)    DOE (dyspnea on exertion)    Drug-induced bradycardia    Gangrene of toe of left foot (HCC)    a.) 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    HLD (hyperlipidemia)    Hypertension    Long term current use of aspirin     Lymphedema of both lower extremities    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)    a.)  s/p amputation 11/04/2022   Peripheral vascular disease (HCC)    Pleural effusion on right 09/09/2020   a.) s/p RIGHT thoracentesis with 2180 cc yield   Pneumonia    Presence of permanent cardiac pacemaker 09/10/2020   a.) TVP placement 09/10/2020 due to intermittent CHB in setting of urosepsis; b.) s/p PPM placement 09/15/2020: MDT Azure XT DR (SN: [id number])   Pulmonary hypertension (HCC) 03/01/2020   a.) TTE: 03/01/2020: RVSP 50-59; b.) TTE 09/10/2020: RVSP 50-59; c.) TTE 09/26/2020: RVSP 37-49   RA (rheumatoid arthritis) (HCC)    Rheumatic fever    Sepsis (HCC) 09/10/2020   a.) urosepsis --> BC x 2 sets and UC all grew out significant Proteus mirabilis; admitted to [facility] 09/07/2020 - 09/27/2020.   Sick sinus syndrome (HCC)    a.) s/p MDT PPM placement 09/15/2020   T2DM (type 2 diabetes mellitus) (HCC)    Urinary retention    chronic, with indwelling Foley catheter and plans for a suprapubic   Wears dentures    full upper    Assessment: Pt is a 78 yo male presenting to ED complaining of feeling of dizziness and confusion that started around 4:00 PM today when he got home from work, found with elevated troponin I level trending up.  Goal of Therapy:  Heparin  level 0.3-0.7 units/ml Monitor platelets by anticoagulation protocol: Yes   Plan:  Bolus 4000 units x 1 Start heparin  infusion at 1500 units/hr Will check HL in 8 hr after start of infusion CBC daily while on heparin   Coretta Dexter, PharmD, [facility] 01/26/2024 10:55 PM

## 2024-01-26 NOTE — Assessment & Plan Note (Signed)
-   Will place the patient on supplement coverage with NovoLog . - Will continue glipizide  and hold off metformin .

## 2024-01-26 NOTE — Assessment & Plan Note (Signed)
Will continue antihypertensive therapy.

## 2024-01-27 ENCOUNTER — Other Ambulatory Visit: Payer: Self-pay

## 2024-01-27 ENCOUNTER — Observation Stay

## 2024-01-27 ENCOUNTER — Encounter: Admitting: Cardiology

## 2024-01-27 DIAGNOSIS — I7121 Aneurysm of the ascending aorta, without rupture: Secondary | ICD-10-CM | POA: Diagnosis not present

## 2024-01-27 DIAGNOSIS — N179 Acute kidney failure, unspecified: Secondary | ICD-10-CM

## 2024-01-27 DIAGNOSIS — I5033 Acute on chronic diastolic (congestive) heart failure: Secondary | ICD-10-CM | POA: Diagnosis not present

## 2024-01-27 DIAGNOSIS — I214 Non-ST elevation (NSTEMI) myocardial infarction: Secondary | ICD-10-CM | POA: Diagnosis not present

## 2024-01-27 DIAGNOSIS — J90 Pleural effusion, not elsewhere classified: Secondary | ICD-10-CM | POA: Diagnosis not present

## 2024-01-27 DIAGNOSIS — I3481 Nonrheumatic mitral (valve) annulus calcification: Secondary | ICD-10-CM | POA: Diagnosis not present

## 2024-01-27 DIAGNOSIS — I200 Unstable angina: Secondary | ICD-10-CM | POA: Diagnosis not present

## 2024-01-27 DIAGNOSIS — R918 Other nonspecific abnormal finding of lung field: Secondary | ICD-10-CM | POA: Diagnosis not present

## 2024-01-27 DIAGNOSIS — N1831 Chronic kidney disease, stage 3a: Secondary | ICD-10-CM

## 2024-01-27 DIAGNOSIS — E1122 Type 2 diabetes mellitus with diabetic chronic kidney disease: Secondary | ICD-10-CM

## 2024-01-27 LAB — TROPONIN I (HIGH SENSITIVITY)
Troponin I (High Sensitivity): 326 ng/L (ref <18)
Troponin I (High Sensitivity): 714 ng/L (ref <18)
Troponin I (High Sensitivity): 1336 ng/L (ref <18)

## 2024-01-27 LAB — BASIC METABOLIC PANEL WITH GFR
Anion gap: 11 (ref 5–15)
BUN: 45 mg/dL — ABNORMAL HIGH (ref 8–23)
CO2: 16 mmol/L — ABNORMAL LOW (ref 22–32)
Calcium: 8.6 mg/dL — ABNORMAL LOW (ref 8.9–10.3)
Chloride: 109 mmol/L (ref 98–111)
Creatinine, Ser: 1.56 mg/dL — ABNORMAL HIGH (ref 0.61–1.24)
GFR, Estimated: 45 mL/min — ABNORMAL LOW (ref >60)
Glucose, Bld: 223 mg/dL — ABNORMAL HIGH (ref 70–99)
Potassium: 4.4 mmol/L (ref 3.5–5.1)
Sodium: 136 mmol/L (ref 135–145)

## 2024-01-27 LAB — COMPREHENSIVE METABOLIC PANEL WITH GFR
ALT: 22 U/L (ref 0–44)
AST: 28 U/L (ref 15–41)
Albumin: 3.2 g/dL — ABNORMAL LOW (ref 3.5–5.0)
Alkaline Phosphatase: 58 U/L (ref 38–126)
Anion gap: 9 (ref 5–15)
BUN: 61 mg/dL — ABNORMAL HIGH (ref 8–23)
CO2: 19 mmol/L — ABNORMAL LOW (ref 22–32)
Calcium: 8.8 mg/dL — ABNORMAL LOW (ref 8.9–10.3)
Chloride: 108 mmol/L (ref 98–111)
Creatinine, Ser: 1.82 mg/dL — ABNORMAL HIGH (ref 0.61–1.24)
GFR, Estimated: 37 mL/min — ABNORMAL LOW (ref >60)
Glucose, Bld: 145 mg/dL — ABNORMAL HIGH (ref 70–99)
Potassium: 4 mmol/L (ref 3.5–5.1)
Sodium: 136 mmol/L (ref 135–145)
Total Bilirubin: 1.2 mg/dL (ref 0.0–1.2)
Total Protein: 6.9 g/dL (ref 6.5–8.1)

## 2024-01-27 LAB — CBC
HCT: 34.6 % — ABNORMAL LOW (ref 39.0–52.0)
Hemoglobin: 11 g/dL — ABNORMAL LOW (ref 13.0–17.0)
MCH: 27.9 pg (ref 26.0–34.0)
MCHC: 31.8 g/dL (ref 30.0–36.0)
MCV: 87.8 fL (ref 80.0–100.0)
Platelets: 143 10*3/uL — ABNORMAL LOW (ref 150–400)
RBC: 3.94 MIL/uL — ABNORMAL LOW (ref 4.22–5.81)
RDW: 16.9 % — ABNORMAL HIGH (ref 11.5–15.5)
WBC: 21.9 10*3/uL — ABNORMAL HIGH (ref 4.0–10.5)
nRBC: 0 % (ref 0.0–0.2)

## 2024-01-27 LAB — BLOOD GAS, ARTERIAL
Acid-base deficit: 5.4 mmol/L — ABNORMAL HIGH (ref 0.0–2.0)
Bicarbonate: 17 mmol/L — ABNORMAL LOW (ref 20.0–28.0)
O2 Saturation: 95.9 %
Patient temperature: 39
pCO2 arterial: 27 mmHg — ABNORMAL LOW (ref 32–48)
pH, Arterial: 7.41 (ref 7.35–7.45)
pO2, Arterial: 84 mmHg (ref 83–108)

## 2024-01-27 LAB — LACTIC ACID, PLASMA
Lactic Acid, Venous: 2.3 mmol/L (ref 0.5–1.9)
Lactic Acid, Venous: 1.5 mmol/L (ref 0.5–1.9)
Lactic Acid, Venous: 1.4 mmol/L (ref 0.5–1.9)

## 2024-01-27 LAB — CBG MONITORING, ED
Glucose-Capillary: 235 mg/dL — ABNORMAL HIGH (ref 70–99)
Glucose-Capillary: 282 mg/dL — ABNORMAL HIGH (ref 70–99)
Glucose-Capillary: 260 mg/dL — ABNORMAL HIGH (ref 70–99)
Glucose-Capillary: 165 mg/dL — ABNORMAL HIGH (ref 70–99)

## 2024-01-27 LAB — PROTIME-INR
INR: 1.5 — ABNORMAL HIGH (ref 0.8–1.2)
Prothrombin Time: 18.2 s — ABNORMAL HIGH (ref 11.4–15.2)

## 2024-01-27 LAB — HEMOGLOBIN A1C
Hgb A1c MFr Bld: 6.9 % — ABNORMAL HIGH (ref 4.8–5.6)
Mean Plasma Glucose: 151.33 mg/dL

## 2024-01-27 LAB — BRAIN NATRIURETIC PEPTIDE: B Natriuretic Peptide: 4043.5 pg/mL — ABNORMAL HIGH (ref 0.0–100.0)

## 2024-01-27 LAB — HEPARIN LEVEL (UNFRACTIONATED)
Heparin Unfractionated: 0.27 [IU]/mL — ABNORMAL LOW (ref 0.30–0.70)
Heparin Unfractionated: 0.39 [IU]/mL (ref 0.30–0.70)

## 2024-01-27 LAB — APTT: aPTT: 72 s — ABNORMAL HIGH (ref 24–36)

## 2024-01-27 LAB — PROCALCITONIN: Procalcitonin: 7.52 ng/mL

## 2024-01-27 LAB — GLUCOSE, CAPILLARY: Glucose-Capillary: 124 mg/dL — ABNORMAL HIGH (ref 70–99)

## 2024-01-27 MED ORDER — INSULIN ASPART 100 UNIT/ML IJ SOLN
0.0000 [IU] | Freq: Three times a day (TID) | INTRAMUSCULAR | Status: DC
  Administered 2024-01-27: 8 [IU] via SUBCUTANEOUS
  Administered 2024-01-27: 3 [IU] via SUBCUTANEOUS
  Administered 2024-01-27: 5 [IU] via SUBCUTANEOUS
  Administered 2024-01-27: 8 [IU] via SUBCUTANEOUS
  Administered 2024-01-28: 5 [IU] via SUBCUTANEOUS
  Administered 2024-01-28: 2 [IU] via SUBCUTANEOUS
  Administered 2024-01-28: 3 [IU] via SUBCUTANEOUS
  Administered 2024-01-29 (×2): 5 [IU] via SUBCUTANEOUS
  Administered 2024-01-29 – 2024-01-30 (×2): 3 [IU] via SUBCUTANEOUS
  Administered 2024-01-30: 5 [IU] via SUBCUTANEOUS
  Administered 2024-01-30 – 2024-01-31 (×3): 3 [IU] via SUBCUTANEOUS
  Administered 2024-01-31: 5 [IU] via SUBCUTANEOUS
  Administered 2024-02-01: 2 [IU] via SUBCUTANEOUS
  Administered 2024-02-01 (×2): 3 [IU] via SUBCUTANEOUS
  Administered 2024-02-02: 5 [IU] via SUBCUTANEOUS
  Administered 2024-02-02: 3 [IU] via SUBCUTANEOUS
  Administered 2024-02-02: 8 [IU] via SUBCUTANEOUS
  Administered 2024-02-03: 5 [IU] via SUBCUTANEOUS
  Administered 2024-02-03: 3 [IU] via SUBCUTANEOUS
  Administered 2024-02-03: 5 [IU] via SUBCUTANEOUS
  Administered 2024-02-04: 3 [IU] via SUBCUTANEOUS
  Administered 2024-02-04 – 2024-02-05 (×3): 8 [IU] via SUBCUTANEOUS
  Administered 2024-02-05 – 2024-02-06 (×2): 5 [IU] via SUBCUTANEOUS
  Administered 2024-02-06: 8 [IU] via SUBCUTANEOUS
  Administered 2024-02-06 – 2024-02-07 (×3): 5 [IU] via SUBCUTANEOUS
  Filled 2024-01-27 (×34): qty 1

## 2024-01-27 MED ORDER — METOPROLOL SUCCINATE ER 50 MG PO TB24
50.0000 mg | ORAL_TABLET | Freq: Every day | ORAL | Status: DC
  Filled 2024-01-27: qty 1

## 2024-01-27 MED ORDER — LACTATED RINGERS IV SOLN: 150.0000 mL/h | INTRAVENOUS | Status: DC

## 2024-01-27 MED ORDER — IOHEXOL 350 MG/ML SOLN
75.0000 mL | Freq: Once | INTRAVENOUS | Status: AC | PRN
  Administered 2024-01-27: 75 mL via INTRAVENOUS

## 2024-01-27 MED ORDER — VANCOMYCIN HCL 2000 MG/400ML IV SOLN: 2000.0000 mg | INTRAVENOUS | Status: DC

## 2024-01-27 MED ORDER — METRONIDAZOLE 500 MG/100ML IV SOLN
500.0000 mg | Freq: Two times a day (BID) | INTRAVENOUS | Status: DC
  Administered 2024-01-27: 500 mg via INTRAVENOUS
  Filled 2024-01-27 (×2): qty 100

## 2024-01-27 MED ORDER — ATORVASTATIN CALCIUM 80 MG PO TABS
80.0000 mg | ORAL_TABLET | Freq: Every day | ORAL | Status: DC
  Administered 2024-01-27 – 2024-02-05 (×11): 80 mg via ORAL
  Filled 2024-01-27: qty 1
  Filled 2024-01-27: qty 4
  Filled 2024-01-27 (×8): qty 1
  Filled 2024-01-27: qty 4

## 2024-01-27 MED ORDER — VANCOMYCIN HCL 2000 MG/400ML IV SOLN
2000.0000 mg | Freq: Once | INTRAVENOUS | Status: AC
  Administered 2024-01-27: 2000 mg via INTRAVENOUS
  Filled 2024-01-27: qty 400

## 2024-01-27 MED ORDER — SODIUM CHLORIDE 0.9 % IV SOLN
2.0000 g | Freq: Two times a day (BID) | INTRAVENOUS | Status: DC
  Administered 2024-01-28: 2 g via INTRAVENOUS
  Filled 2024-01-27: qty 12.5

## 2024-01-27 MED ORDER — ACETAMINOPHEN 10 MG/ML IV SOLN
1000.0000 mg | Freq: Four times a day (QID) | INTRAVENOUS | Status: AC
  Administered 2024-01-27 – 2024-01-28 (×3): 1000 mg via INTRAVENOUS
  Filled 2024-01-27 (×4): qty 100

## 2024-01-27 MED ORDER — INSULIN ASPART 100 UNIT/ML IJ SOLN
0.0000 [IU] | Freq: Every day | INTRAMUSCULAR | Status: DC
Start: 2024-01-27 — End: 2024-02-07
  Administered 2024-02-03 – 2024-02-06 (×3): 2 [IU] via SUBCUTANEOUS
  Filled 2024-01-27 (×3): qty 1

## 2024-01-27 MED ORDER — BUMETANIDE 0.25 MG/ML IJ SOLN
2.0000 mg | Freq: Two times a day (BID) | INTRAMUSCULAR | Status: DC
  Administered 2024-01-27 (×2): 2 mg via INTRAVENOUS
  Filled 2024-01-27 (×3): qty 8

## 2024-01-27 MED ORDER — CHLORHEXIDINE GLUCONATE CLOTH 2 % EX PADS
6.0000 | MEDICATED_PAD | Freq: Every day | CUTANEOUS | Status: DC
  Administered 2024-01-28 – 2024-02-07 (×11): 6 via TOPICAL

## 2024-01-27 MED ORDER — SODIUM CHLORIDE 0.9 % IV SOLN
2.0000 g | Freq: Once | INTRAVENOUS | Status: AC
  Administered 2024-01-27: 2 g via INTRAVENOUS
  Filled 2024-01-27: qty 12.5

## 2024-01-27 MED ORDER — SODIUM BICARBONATE 8.4 % IV SOLN
100.0000 meq | Freq: Once | INTRAVENOUS | Status: AC
  Administered 2024-01-27: 100 meq via INTRAVENOUS
  Filled 2024-01-27: qty 50

## 2024-01-27 MED ORDER — SODIUM BICARBONATE 8.4 % IV SOLN
INTRAVENOUS | Status: AC
  Administered 2024-01-27: 100 meq via INTRAVENOUS
  Filled 2024-01-27: qty 50

## 2024-01-27 MED ORDER — HEPARIN BOLUS VIA INFUSION
1700.0000 [IU] | Freq: Once | INTRAVENOUS | Status: AC
  Administered 2024-01-27: 1700 [IU] via INTRAVENOUS
  Filled 2024-01-27: qty 1700

## 2024-01-27 MED ORDER — VANCOMYCIN HCL IN DEXTROSE 1-5 GM/200ML-% IV SOLN: 1000.0000 mg | Freq: Once | INTRAVENOUS | Status: DC

## 2024-01-27 NOTE — Hospital Course (Addendum)
 Taken from H&P.  Tyler [name] is a 78 y.o. 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  He was also experiencing 5/10, nonradiating dull chest pain with no other associated symptoms.  On presentation patient has mild tachycardia.  Labs with BUN of 41, creatinine 1.5, baseline seems to be around 1.3, CO2 of 17, troponin 69>.105 EKG shows A-fib with RVR, PVCs and LBBB. CT head was negative for any acute intracranial abnormality CXR with cardiomegaly and mild central congestion.  Patient was initially started on heparin  infusion and cardiology was consulted.  5/5: Vital stable.  Worsening leukocytosis at 21.9, platelet at 143, none anion gap metabolic acid with worsening of bicarb to 16, BNP significantly elevated at 4043. Troponin continued to increase, currently at 714. Cardiology started him on IV Bumex .  Echocardiogram ordered  5/6: Rapid response was called overnight when patient became very tachycardic and tachypneic, found to be febrile at 102.9.  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 Blood cultures started growing group B streptococcus this morning-antibiotics are being switched to ceftriaxone .  ID was consulted.  Patient also has a pacemaker in place. Rising troponin, maximum recorded at 1830, improving leukocytosis, potassium 3.3 and worsening creatinine at 2.05 so further IV  Bumex  was held this morning.  HPI: Tyler [name] is a 78 y.o. Caucasian male with medical history significant for HFpEF, osteoarthritis, atrial fibrillation and flutter, coronary artery disease, stage III chronic kidney disease, dyslipidemia and hypertension, as well as type 2 diabetes mellitus, who presented to the emergency room with a cancer of dizziness, lightheadedness and confusion that started around 4 PM today when he came back home from work.  This was associated with chest pain and dyspnea.  He describes his chest pain as dull aching graded 5/10 in severity with no radiation, nausea or vomiting or diaphoresis or palpitations.  His symptoms later resolved spontaneously.    He feels tactile fever without chills.  No cough or wheezing or vomitus.  No leg pain or edema recent travel or surgery.  No dysuria, oliguria or hematuria or flank pain.  No bleeding diathesis.   The patient was started on IV heparin  per ACS protocol. He will be admitted to a cardiac telemetry observation bed for further evaluation and management.   Significant Events: Admitted 01/26/2024 for chest pain 01-27-2024 blood cx positive for Group B Strep agalactiae  Significant Labs: Na 137, k 4.2, CO2 of 17, BUN 41, Scr 1.5, glu 189 Ca 9.3, TP 71. Alb 3.8 WBC 18.2, HgB 11.5, plt 164 Tropoin I 69-->105-->326 A1c 6.9% BNP 4043 01-27-2024 Blood cx Group B strep agalactiae  Significant Imaging Studies: CT head No acute intracranial process. 2. Mild diffuse atrophy and mild chronic small vessel ischemic changes CXR Cardiomegaly with mild central congestion  CTPA Negative for acute pulmonary embolism. 2. Findings suggestive of CHF with mild pulmonary edema. Small right pleural effusion. 3. 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 Recommend annual imaging followup by CTA or MRA. 01-28-2024 echo LVEF 25%  02-04-2024 TEE negative for vegetations  Antibiotic Therapy: Anti-infectives (From admission, onward)    Start      Dose/Rate Route Frequency Ordered Stop   01/28/24 1600  vancomycin  (VANCOREADY) IVPB 2000 mg/400 mL  Status:  Discontinued        2,000 mg 200 mL/hr over 120 Minutes Intravenous Every 24 hours 01/27/24 2009 01/28/24 0844   01/28/24 1600  cefTRIAXone  (ROCEPHIN ) 2 g in sodium chloride  0.9 % 100 mL IVPB        2 g 200 mL/hr over 30 Minutes Intravenous Daily 01/28/24 0844     01/28/24 0800  ceFEPIme  (MAXIPIME ) 2 g in sodium chloride  0.9 % 100 mL IVPB  Status:  Discontinued        2 g 200 mL/hr over 30 Minutes Intravenous Every 12 hours 01/27/24 1950 01/28/24 0844   01/27/24 2030  ceFEPIme  (MAXIPIME ) 2 g in sodium chloride  0.9 % 100 mL IVPB        2 g 200 mL/hr over 30 Minutes Intravenous  Once 01/27/24 1942 01/27/24 2242   01/27/24 2030  metroNIDAZOLE  (FLAGYL ) IVPB 500 mg  Status:  Discontinued        500 mg 100 mL/hr over 60 Minutes Intravenous Every 12 hours 01/27/24 1942 01/28/24 0844   01/27/24 2030  vancomycin  (VANCOCIN ) IVPB 1000 mg/200 mL premix  Status:  Discontinued        1,000 mg 200 mL/hr over 60 Minutes Intravenous  Once 01/27/24 1942 01/27/24 1948   01/27/24 1945  vancomycin  (VANCOREADY) IVPB 2000 mg/400 mL        2,000 mg 200 mL/hr over 120 Minutes Intravenous  Once 01/27/24 1948 01/27/24 2304   01/26/24 2300  doxycycline  (VIBRA -TABS) tablet 100 mg  Status:  Discontinued        100 mg Oral 2 times daily 01/26/24 2254 01/26/24 2346       Procedures: 02-04-2024 TEE negative for vegetations  Consultants: Cardiology CHF EP/Cards ID

## 2024-01-27 NOTE — Assessment & Plan Note (Addendum)
 Patient with volume overload, positive JVD and elevated BNP at 4043 Echo cardiogram ordered-pending -Cardiology started him on IV Bumex  2 mg twice daily -Continue with metolazone  -Daily weight and BMP -Strict intake and output

## 2024-01-27 NOTE — Consult Note (Addendum)
 [facility] CLINIC CARDIOLOGY CONSULT NOTE       Patient ID: Tyler [name] MRN: [id number] DOB/AGE: 01/21/1946 78 y.o.  Admit date: 01/26/2024 Referring Physician Dr. Amalia [name] Primary Physician Tyler Mose, NP  Primary Cardiologist Dr. Bob [name] Reason for Consultation chest pain  HPI: Tyler [name] [place] is a 78 y.o. male  with a past medical history of chronic HFrEF, persistent atrial fibrillation, mitral insufficiency, CHB s/p PPM 2021, hypertension, chronic kidney disease stage III who presented to the ED on 01/26/2024 for chest pain. Cardiology was consulted for further evaluation.   Patient states he reported to the ED due to worsening SOB. Work up in the ED notable for Na 137, K 4.2, Cr 1.5 (baseline around 1.35), Hgb 11.5, WBC 18.2, plts 164, neutrophil count 16.2. A1C 6.9. CXR with cardiomegaly and mild central congestion. Troponins elevated and trending 69 > 105 > 326. EKG in ED persistent atrial fibrillation with PVCs at 112 bpm. Patient received 3 doses of SL nitroglycerin , 1 dose of IV morphine, and started on heparin  infusion.   At the time of my evaluation this AM, patient is laying on ED stretcher at an incline. We discussed his symptoms in further detail. Patient states he's had SOB for the past few weeks and felt that his SOB was improving until 2 days ago SOB suddenly got worse. Patient does not report any chest pain or other associated symptoms during my evaluation. Patient endorses orthopnea, notably SOB when laying in bed. Also has chronic LE edema which he reports has been worse as well.   Review of systems complete and found to be negative unless listed above    Past Medical History:  Diagnosis Date   (HFpEF) heart failure with preserved ejection fraction (HCC) 03/01/2020   a.) TTE 03/01/2020: EF 55-60%, mod MAC, mod AoV sclerosis, triv AR, mild TR, mod MR, RVSP 50-59; b.) TTE 09/10/2020: EF 55-60%, mod MAC, mod AoV sclerosis, mild TR, 3+ MR, RVSP 50-59; c.) TTE 09/26/2020:  EF 55-60%, mild LA dil, triv PR, mild MR/TR, RVSP 37-49   Adenoma of left adrenal gland    Anemia    Arthritis    Atrial fibrillation and flutter (HCC)    a.) CHA2DS2VASc = 5 (age x2, HFpEF, HTN, T2DM);  b.) s/p CTI ablation 09/07/2020; c.) rate/rhythm maintained on oral carvedilol ; not on chronic anticoagulation therapy   CAD (coronary artery disease)    Cardiomegaly    CKD (chronic kidney disease), stage III (HCC)    DOE (dyspnea on exertion)    Drug-induced bradycardia    Gangrene of toe of left foot (HCC)    a.) 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    HLD (hyperlipidemia)    Hypertension    Long term current use of aspirin     Lymphedema of both lower extremities    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)    a.) s/p amputation 11/04/2022   Peripheral vascular disease (HCC)    Pleural effusion on right 09/09/2020   a.) s/p RIGHT thoracentesis with 2180 cc yield   Pneumonia    Presence of permanent cardiac pacemaker 09/10/2020   a.) TVP placement 09/10/2020 due to intermittent CHB in setting of urosepsis; b.) s/p PPM placement 09/15/2020: MDT Azure XT DR (SN: [id number])   Pulmonary hypertension (HCC) 03/01/2020   a.) TTE: 03/01/2020: RVSP 50-59; b.) TTE 09/10/2020: RVSP 50-59; c.) TTE 09/26/2020: RVSP 37-49   RA (rheumatoid  arthritis) (HCC)    Rheumatic fever    Sepsis (HCC) 09/10/2020   a.) urosepsis --> BC x 2 sets and UC all grew out significant Proteus mirabilis; admitted to [facility] 09/07/2020 - 09/27/2020.   Sick sinus syndrome [facility])    a.) s/p MDT PPM placement 09/15/2020   T2DM (type 2 diabetes mellitus) (HCC)    Urinary retention    chronic, with indwelling Foley catheter and plans for a suprapubic   Wears dentures    full upper    Past Surgical History:  Procedure Laterality Date   AMPUTATION TOE Right 11/04/2022   Procedure: AMPUTATION TOE;  Surgeon: Dot Gazella,  [name];  [name];  Service: Podiatry;  Laterality: Right;   AMPUTATION TOE Right 09/15/2023   Procedure: AMPUTATION TOE;  Surgeon: Jennefer Moats, [name];  [name];  Service: Orthopedics/Podiatry;  Laterality: Right;  Right hallux amputation   BONE BIOPSY Right 04/05/2023   Procedure: BONE BIOPSY THIRD & FOURTH;  Surgeon: Dot Gazella, [name];  [name];  Service: Podiatry;  Laterality: Right;   CARDIAC ELECTROPHYSIOLOGY STUDY AND ABLATION N/A 09/07/2020   Procedure: CARDIAC EP STUDY AND ABLATION (CTI)   CATARACT EXTRACTION W/PHACO Right 01/16/2017   Procedure: CATARACT EXTRACTION PHACO AND INTRAOCULAR LENS PLACEMENT (IOC)  Right Complicated;  Surgeon: Annell Kidney, MD;  Location: [facility];  Service: Ophthalmology;  Laterality: Right;  IVA Block Healon 5 malyugin vision blue Diabetic - oral meds   CATARACT EXTRACTION W/PHACO Left 10/23/2022   Procedure: CATARACT EXTRACTION PHACO AND INTRAOCULAR LENS PLACEMENT (IOC) LEFT DIABETIC;  Surgeon: Clair Crews, MD;  Location: [facility];  Service: Ophthalmology;  Laterality: Left;  Diabetic   COLONOSCOPY     INCISION AND DRAINAGE OF WOUND Left 09/15/2023   Procedure: IRRIGATION AND DEBRIDEMENT WOUND;  Surgeon: Jennefer Moats, [name];  [name];  Service: Orthopedics/Podiatry;  Laterality: Left;  Left foot wound debridement, possible graft/biopsy   LOWER EXTREMITY ANGIOGRAPHY Right 11/02/2022   Procedure: Lower Extremity Angiography;  Surgeon: Celso College, MD;  Location: [facility] INV[facility];  Service: Cardiovascular;  Laterality: Right;   LOWER EXTREMITY ANGIOGRAPHY Right 09/13/2023   Procedure: Lower Extremity Angiography;  Surgeon: Jackquelyn Mass, MD;  Location: [facility] INVAS[facility] CV LAB;  Service: Cardiovascular;  Laterality: Right;   METATARSAL HEAD EXCISION Left 07/05/2018   Procedure: RESECTION FIRST METATARSAL INFECTED BONE AND SOFT TISSUE;  Surgeon: Sharlyn Deaner, [name];   [name];  Service: Podiatry;  Laterality: Left;   METATARSAL HEAD EXCISION Right 04/05/2023   Procedure: METATARSAL HEAD EXCISION THIRD & FOURTH;  Surgeon: Dot Gazella, [name];  [name];  Service: Podiatry;  Laterality: Right;   PACEMAKER INSERTION  09/15/2020   TOE AMPUTATION Left    TONSILLECTOMY      (Not in a hospital admission)  Social History   Socioeconomic History   Marital status: Single    Spouse name: Not on file   Number of children: Not on file   Years of education: Not on file   Highest education level: Not on file  Occupational History    Comment: [employer]  Tobacco Use   Smoking status: Never   Smokeless tobacco: Never  Vaping Use   Vaping status: Never Used  Substance and Sexual Activity   Alcohol use: Not Currently    Comment: rarely   Drug use: Never   Sexual activity: Not Currently    Birth control/protection: None  Other Topics Concern  Not on file  Social History Narrative   Lives with roommate   Social Drivers of Health   Financial Resource Strain: Low Risk  (03/20/2023)   Received from [facility], [facility]   Overall Financial Resource Strain (CARDIA)    Difficulty of Paying Living Expenses: Not hard at all  Food Insecurity: No Food Insecurity (09/13/2023)   Hunger Vital Sign    Worried About Running Out of Food in the Last Year: Never true    Ran Out of Food in the Last Year: Never true  Transportation Needs: Patient Declined (09/14/2023)   PRAPARE - Administrator, Civil Service (Medical): Patient declined    Lack of Transportation (Non-Medical): Patient declined  Physical Activity: Not on file  Stress: No Stress Concern Present (09/07/2020)   Received from [facility], [facility]   [employer] of Occupational Health - Occupational Stress Questionnaire    Feeling of Stress : Not at all  Social Connections: Unknown (02/01/2022)   Received from [facility], [facility]    Social Network    Social Network: Not on file  Intimate Partner Violence: Patient Declined (09/14/2023)   Humiliation, Afraid, Rape, and Kick questionnaire    Fear of Current or Ex-Partner: Patient declined    Emotionally Abused: Patient declined    Physically Abused: Patient declined    Sexually Abused: Patient declined    Family History  Problem Relation Age of Onset   Cancer Niece      Vitals:   01/27/24 0200 01/27/24 0230 01/27/24 0530 01/27/24 0617  BP: 117/74 98/62 109/76   Pulse: 99 99 95 (!) 113  Resp:   (!) 25 (!) 26  Temp:    98.7 F (37.1 C)  TempSrc:    Oral  SpO2: 96% 91% 100% 95%  Weight:      Height:        PHYSICAL EXAM General: chronically ill appearing elderly male, well nourished, in no acute distress. HEENT: Normocephalic and atraumatic. Neck: No JVD.  Lungs: Normal respiratory effort on 2L. Bibasilar crackles. Heart: Irregularly irregular. Normal S1 and S2 without gallops or murmurs.  Abdomen: Non-distended appearing.  Msk: Normal strength and tone for age. Extremities: Warm and well perfused. No clubbing, cyanosis. 3+ pitting edema bilaterally.  Neuro: Alert and oriented X 3. Psych: Answers questions appropriately.   Labs: Basic Metabolic Panel: Recent Labs    01/26/24 1936 01/27/24 0151  NA 137 136  K 4.2 4.4  CL 108 109  CO2 17* 16*  GLUCOSE 189* 223*  BUN 41* 45*  CREATININE 1.50* 1.56*  CALCIUM  9.3 8.6*   Liver Function Tests: Recent Labs    01/26/24 1936  AST 24  ALT 24  ALKPHOS 78  BILITOT 0.8  PROT 7.1  ALBUMIN 3.8   No results for input(s): "LIPASE", "AMYLASE" in the last 72 hours. CBC: Recent Labs    01/26/24 1936 01/27/24 0151  WBC 18.2* 21.9*  NEUTROABS 16.2*  --   HGB 11.5* 11.0*  HCT 36.3* 34.6*  MCV 88.5 87.8  PLT 164 143*   Cardiac Enzymes: Recent Labs    01/26/24 1936 01/26/24 2133 01/27/24 0151  TROPONINIHS 69* 105* 326*   BNP: No results for input(s): "BNP" in the last 72  hours. D-Dimer: No results for input(s): "DDIMER" in the last 72 hours. Hemoglobin A1C: Recent Labs    01/27/24 0153  HGBA1C 6.9*   Fasting Lipid Panel: No results for input(s): "CHOL", "HDL", "LDLCALC", "TRIG", "CHOLHDL", "LDLDIRECT" in  the last 72 hours. Thyroid Function Tests: No results for input(s): "TSH", "T4TOTAL", "T3FREE", "THYROIDAB" in the last 72 hours.  Invalid input(s): "FREET3" Anemia Panel: No results for input(s): "VITAMINB12", "FOLATE", "FERRITIN", "TIBC", "IRON", "RETICCTPCT" in the last 72 hours.   Radiology: CT Head Wo Contrast Result Date: 01/26/2024 CLINICAL DATA:  Mental status change. EXAM: CT HEAD WITHOUT CONTRAST TECHNIQUE: Contiguous axial images were obtained from the base of the skull through the vertex without intravenous contrast. RADIATION DOSE REDUCTION: This exam was performed according to the departmental dose-optimization program which includes automated exposure control, adjustment of the mA and/or kV according to patient size and/or use of iterative reconstruction technique. COMPARISON:  Head CT 07/04/2018. FINDINGS: Brain: No evidence of acute infarction, hemorrhage, hydrocephalus, extra-axial collection or mass lesion/mass effect. There is mild diffuse atrophy and mild periventricular white matter hypodensity which is similar to the prior study. Vascular: Atherosclerotic calcifications are present within the cavernous internal carotid arteries. Skull: Normal. Negative for fracture or focal lesion. Sinuses/Orbits: No acute finding. Other: None. IMPRESSION: 1. No acute intracranial process. 2. Mild diffuse atrophy and mild chronic small vessel ischemic changes. Electronically Signed   By: Tyron Gallon M.D.   On: 01/26/2024 20:18   DG Chest Portable 1 View Result Date: 01/26/2024 CLINICAL DATA:  Chest pain EXAM: PORTABLE CHEST 1 VIEW COMPARISON:  09/11/2023 FINDINGS: Left-sided pacing device similar in position. Cardiomegaly with mild central congestion. No  consolidation, pleural effusion or pneumothorax. IMPRESSION: Cardiomegaly with mild central congestion. Electronically Signed   By: Esmeralda Hedge M.D.   On: 01/26/2024 20:14    ECHO ordered  TELEMETRY reviewed by me 01/27/2024: off tele  EKG reviewed by me: atrial fibrillation, rate 112 bpm PVCs.    Data reviewed by me 01/27/2024: last 24h vitals tele labs imaging I/O ED provider note, admission H&P  Principal Problem:   Chest pain Active Problems:   Acute on chronic diastolic CHF (congestive heart failure) (HCC)   Type II diabetes mellitus with renal manifestations (HCC)   Dyslipidemia   Essential hypertension   NSTEMI (non-ST elevated myocardial infarction) (HCC)   Acute kidney injury superimposed on chronic kidney disease (HCC)    ASSESSMENT AND PLAN:  Tyler [name] is a 78 y.o. male  with a past medical history of chronic HFrEF, persistent atrial fibrillation, mitral insufficiency, CHB s/p PPM 2021, hypertension, chronic kidney disease stage III who presented to the ED on 01/26/2024 for chest pain. Cardiology was consulted for further evaluation.   # NSTEMI, type I vs type II # Acute on chronic HFrEF Patient reports to ED due to worsening SOB. Patient was seen outpatient cardiology on 09/2023 and there was plan for ischemic eval with PE stress test, however patient did not perform this exam or follow-up since this visit. Patient appears fluid overload on exam. Troponins elevated and trending 69 > 105 > 326 > 714. EKG in ED with AF PVCs with no acute ischemic changes, rate 112 bpm. Cr is elevated at 1.56 (baseline around 1.35). BP is stable. BNP elevated at 4043. -Echo ordered.  -Continue to trend troponins until peaked.  -Continue heparin  gtt.  -Continue ASA 81 mg, clopidogrel  75 mg, atorvastatin  80 mg daily. -Continue finerenone  10 mg daily. -Ordered IV bumex  2 mg twice daily. Closely monitor renal function and UOP. -Defer starting home losartan  as Cr is elevated from baseline.  Consider resuming when renal function is stable.   -Hold home metoprolol  until patient is closer to euvolemia.  -After diuresis and patient  more stable from HF perspective will decide on ischemic evaluation.   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 Underwent PPM implant in 2021 for CHB, has had regular device checks since implant. EKG in ED and per tele this AM AF rates in 100s.  -Beta-blocker as above. Will resume for rate control when patient is more stable from HF perspective. -Recommend anticoagulation however patient has historically deferred given prior bleeding issues.  # Chronic kidney disease stage IIIa Patient with hx of CKD, baseline Cr 1.3-1.4. Cr this AM 1.56. -Continue to monitor renal function closely during diuresis. -Management per primary.   TIMI Risk Score for Unstable Angina or Non-ST Elevation MI:   [name] TIMI risk score is 4, which indicates a 20% risk of all cause mortality, new or recurrent myocardial infarction or need for urgent revascularization in the next 14 days.    This patient's plan of care was discussed and created with Dr. Bob [name] and he is in agreement.  Signed: Hamp Levine, PA-C  01/27/2024, 9:26 AM [facility] Cardiology

## 2024-01-27 NOTE — Sepsis Progress Note (Signed)
 Elink monitoring for the code sepsis protocol.

## 2024-01-27 NOTE — Assessment & Plan Note (Addendum)
-   This is AKI superimposed on stage IIIa chronic kidney disease. - This is likely secondary to renal hypoperfusion due to acute CHF. -Will follow BMP with diuresis.

## 2024-01-27 NOTE — Progress Notes (Signed)
 Lab called for critical lactic of 2.03. Receiving RN made aware.

## 2024-01-27 NOTE — Progress Notes (Signed)
 ANTICOAGULATION CONSULT NOTE  Pharmacy Consult for heparin  infusion Indication: ACS/STEMI  Allergies  Allergen Reactions   Eliquis  [Apixaban ]     Dizziness  and vision change   Spironolactone  Other (See Comments)    dizziness    Patient Measurements: Height: 6\' 6"  (198.1 cm) Weight: 118.3 kg (260 lb 12.8 oz) IBW/kg (Calculated) : 91.4 Heparin  Dosing Weight: 115.5 kg  Vital Signs: Temp: 102.9 F (39.4 C) (05/05 1907) Temp Source: Oral (05/05 1907) BP: 130/71 (05/05 1907) Pulse Rate: 115 (05/05 1907)  Labs: Recent Labs    01/26/24 1936 01/26/24 1938 01/26/24 2133 01/27/24 0151 01/27/24 0844  HGB 11.5*  --   --  11.0*  --   HCT 36.3*  --   --  34.6*  --   PLT 164  --   --  143*  --   APTT  --  31  --   --   --   HEPARINUNFRC  --   --   --   --  0.27*  CREATININE 1.50*  --   --  1.56*  --   TROPONINIHS 69*  --  105* 326* 714*    Estimated Creatinine Clearance: 55.5 mL/min (A) (by C-G formula based on SCr of 1.56 mg/dL (H)).   Medical History: Past Medical History:  Diagnosis Date   (HFpEF) heart failure with preserved ejection fraction (HCC) 03/01/2020   a.) TTE 03/01/2020: EF 55-60%, mod MAC, mod AoV sclerosis, triv AR, mild TR, mod MR, RVSP 50-59; b.) TTE 09/10/2020: EF 55-60%, mod MAC, mod AoV sclerosis, mild TR, 3+ MR, RVSP 50-59; c.) TTE 09/26/2020: EF 55-60%, mild LA dil, triv PR, mild MR/TR, RVSP 37-49   Adenoma of left adrenal gland    Anemia    Arthritis    Atrial fibrillation and flutter (HCC)    a.) CHA2DS2VASc = 5 (age x2, HFpEF, HTN, T2DM);  b.) s/p CTI ablation 09/07/2020; c.) rate/rhythm maintained on oral carvedilol ; not on chronic anticoagulation therapy   CAD (coronary artery disease)    Cardiomegaly    CKD (chronic kidney disease), stage III (HCC)    DOE (dyspnea on exertion)    Drug-induced bradycardia    Gangrene of toe of left foot (HCC)    a.) 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   Hepatosplenomegaly    History of bilateral  cataract extraction    HLD (hyperlipidemia)    Hypertension    Long term current use of aspirin     Lymphedema of both lower extremities    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)    a.) s/p amputation 11/04/2022   Peripheral vascular disease (HCC)    Pleural effusion on right 09/09/2020   a.) s/p RIGHT thoracentesis with 2180 cc yield   Pneumonia    Presence of permanent cardiac pacemaker 09/10/2020   a.) TVP placement 09/10/2020 due to intermittent CHB in setting of urosepsis; b.) s/p PPM placement 09/15/2020: MDT Azure XT DR (SN: [id number])   Pulmonary hypertension (HCC) 03/01/2020   a.) TTE: 03/01/2020: RVSP 50-59; b.) TTE 09/10/2020: RVSP 50-59; c.) TTE 09/26/2020: RVSP 37-49   RA (rheumatoid arthritis) (HCC)    Rheumatic fever    Sepsis (HCC) 09/10/2020   a.) urosepsis --> BC x 2 sets and UC all grew out significant Proteus mirabilis; admitted to [facility] 09/07/2020 - 09/27/2020.   Sick sinus syndrome (HCC)    a.) s/p MDT PPM placement 09/15/2020   T2DM (type 2 diabetes mellitus) (HCC)  Urinary retention    chronic, with indwelling Foley catheter and plans for a suprapubic   Wears dentures    full upper    Assessment: Pt is a 78 yo male presenting to ED complaining of feeling of dizziness and confusion that started around 4:00 PM today when he got home from work, found with elevated troponin I level trending up.  5/5 08:44  HL 0.27  SUBtherapeutic 5/5 1826 HL 0.39    Therapeutic x 1  Goal of Therapy:  Heparin  level 0.3-0.7 units/ml Monitor platelets by anticoagulation protocol: Yes   Plan:  5/5 1826 HL 0.39    Therapeutic x 1 continue heparin  infusion at 1750 units/hr Will check confirmatory HL in 8 hr  CBC daily while on heparin   Thomasine Flick PharmD Clinical Pharmacist 01/27/2024

## 2024-01-27 NOTE — Progress Notes (Signed)
 ANTICOAGULATION CONSULT NOTE  Pharmacy Consult for heparin  infusion Indication: ACS/STEMI  Allergies  Allergen Reactions   Eliquis  [Apixaban ]     Dizziness  and vision change   Spironolactone  Other (See Comments)    dizziness    Patient Measurements: Height: 6\' 6"  (198.1 cm) Weight: 118.3 kg (260 lb 12.8 oz) IBW/kg (Calculated) : 91.4 Heparin  Dosing Weight: 115.5 kg  Vital Signs: Temp: 99.3 F (37.4 C) (05/05 0943) Temp Source: Oral (05/05 0943) BP: 116/67 (05/05 0943) Pulse Rate: 99 (05/05 0943)  Labs: Recent Labs    01/26/24 1936 01/26/24 1938 01/26/24 2133 01/27/24 0151 01/27/24 0844  HGB 11.5*  --   --  11.0*  --   HCT 36.3*  --   --  34.6*  --   PLT 164  --   --  143*  --   APTT  --  31  --   --   --   HEPARINUNFRC  --   --   --   --  0.27*  CREATININE 1.50*  --   --  1.56*  --   TROPONINIHS 69*  --  105* 326* 714*    Estimated Creatinine Clearance: 55.5 mL/min (A) (by C-G formula based on SCr of 1.56 mg/dL (H)).   Medical History: Past Medical History:  Diagnosis Date   (HFpEF) heart failure with preserved ejection fraction (HCC) 03/01/2020   a.) TTE 03/01/2020: EF 55-60%, mod MAC, mod AoV sclerosis, triv AR, mild TR, mod MR, RVSP 50-59; b.) TTE 09/10/2020: EF 55-60%, mod MAC, mod AoV sclerosis, mild TR, 3+ MR, RVSP 50-59; c.) TTE 09/26/2020: EF 55-60%, mild LA dil, triv PR, mild MR/TR, RVSP 37-49   Adenoma of left adrenal gland    Anemia    Arthritis    Atrial fibrillation and flutter (HCC)    a.) CHA2DS2VASc = 5 (age x2, HFpEF, HTN, T2DM);  b.) s/p CTI ablation 09/07/2020; c.) rate/rhythm maintained on oral carvedilol ; not on chronic anticoagulation therapy   CAD (coronary artery disease)    Cardiomegaly    CKD (chronic kidney disease), stage III (HCC)    DOE (dyspnea on exertion)    Drug-induced bradycardia    Gangrene of toe of left foot (HCC)    a.) 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   Hepatosplenomegaly    History of bilateral  cataract extraction    HLD (hyperlipidemia)    Hypertension    Long term current use of aspirin     Lymphedema of both lower extremities    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)    a.) s/p amputation 11/04/2022   Peripheral vascular disease (HCC)    Pleural effusion on right 09/09/2020   a.) s/p RIGHT thoracentesis with 2180 cc yield   Pneumonia    Presence of permanent cardiac pacemaker 09/10/2020   a.) TVP placement 09/10/2020 due to intermittent CHB in setting of urosepsis; b.) s/p PPM placement 09/15/2020: MDT Azure XT DR (SN: [id number])   Pulmonary hypertension (HCC) 03/01/2020   a.) TTE: 03/01/2020: RVSP 50-59; b.) TTE 09/10/2020: RVSP 50-59; c.) TTE 09/26/2020: RVSP 37-49   RA (rheumatoid arthritis) (HCC)    Rheumatic fever    Sepsis (HCC) 09/10/2020   a.) urosepsis --> BC x 2 sets and UC all grew out significant Proteus mirabilis; admitted to [facility] 09/07/2020 - 09/27/2020.   Sick sinus syndrome (HCC)    a.) s/p MDT PPM placement 09/15/2020   T2DM (type 2 diabetes mellitus) (HCC)  Urinary retention    chronic, with indwelling Foley catheter and plans for a suprapubic   Wears dentures    full upper    Assessment: Pt is a 78 yo male presenting to ED complaining of feeling of dizziness and confusion that started around 4:00 PM today when he got home from work, found with elevated troponin I level trending up.  5/5 08:44  HL 0.27  SUBtherapeutic  Goal of Therapy:  Heparin  level 0.3-0.7 units/ml Monitor platelets by anticoagulation protocol: Yes   Plan:  Bolus 1700 units Increase heparin  infusion to 1750 units/hr Will check HL in 8 hr after rate change CBC daily while on heparin   Dania Dupre, PharmD, BCPS 01/27/2024 10:25 AM

## 2024-01-27 NOTE — Progress Notes (Signed)
 Patient arrived to unit at 1855 looking very lethargic.  Unable to follow commands.  Vitals were taken and showed that patient was running fever of 102.5, HR elevated in 120s.  Daughter at bedside and patient unable to recognize her.  He is able to make 1--2 word sentences. Rapid response called d/t patient being extremely lethargic with abnormal vitals.  MD at bedside and put in additional orders.    Report given to oncoming nurse who will take over care.

## 2024-01-27 NOTE — Progress Notes (Signed)
 Pharmacy Antibiotic Note  Tyler [name] is a 78 y.o. male admitted on 01/26/2024 with sepsis/Temp spike to 102.9.  Pharmacy has been consulted for cefepime  and vancomycin  dosing.  -PCT ordered -MRSA PCR ordered -also on Metronidazole   Plan: Cefepime  2 gm IV x 1 ordered -Will continue with Cefepime  2 gm IV q12h   for Crcl 55 ml/min  -Ordered Vancomycin  2000 mg IV  x 1 as Loading dose Will continue with Vancomycin  2000 mg IV Q 24 hrs. Goal AUC 400-550. Expected AUC: 516 Cmin 13 SCr used: 1.56  Continue to follow for infection source, renal fxn, cultures, length of therapy   Height: 6\' 6"  (198.1 cm) Weight: 118.3 kg (260 lb 12.8 oz) IBW/kg (Calculated) : 91.4  Temp (24hrs), Avg:99.5 F (37.5 C), Min:97.7 F (36.5 C), Max:102.9 F (39.4 C)  Recent Labs  Lab 01/26/24 1936 01/27/24 0151 01/27/24 1826  WBC 18.2* 21.9*  --   CREATININE 1.50* 1.56*  --   LATICACIDVEN  --   --  2.3*    Estimated Creatinine Clearance: 55.5 mL/min (A) (by C-G formula based on SCr of 1.56 mg/dL (H)).    Allergies  Allergen Reactions   Eliquis  [Apixaban ]     Dizziness  and vision change   Spironolactone  Other (See Comments)    dizziness    Antimicrobials this admission: Metronidazole  5/5>> cefepime  5/5 >>   vanc 5/5 >>    Dose adjustments this admission:    Microbiology results: 5/5 BCx: pending   UCx:      Sputum:    5/5  MRSA PCR: pending  Thank you for allowing pharmacy to be a part of this patient's care.  Thomasine Flick PharmD Clinical Pharmacist 01/27/2024

## 2024-01-27 NOTE — Progress Notes (Addendum)
     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  01/27/24: Rapid response called for increased work of breathing, tachypnea, tachycardia, fever of 102F and worsening lethargy.  On arrival to the bedside initial vital signs showed HR of 115 beats/minute, BP mm Hg, the RR 41 breaths/minute, and the oxygen saturation 97% on 2L and a temperature of 102.64F (39.4C). Patient was lethargic, moaned intermittently, and responded with one-word answers;  He was noted with mild work of breathing and significant swelling and erythema to bilateral lower extremity.           Reviewed prior Pertinent Labs/Diagnostics Findings as below:  5/4: Chest Xray>  5/4: Noncontrast CT head>  Recent Labs  Lab 01/26/24 1936 01/27/24 0151  WBC 18.2* 21.9*  NEUTROABS 16.2*  --   HGB 11.5* 11.0*  HCT 36.3* 34.6*  MCV 88.5 87.8  PLT 164 143*   Basic Metabolic Panel: Recent Labs  Lab 01/26/24 1936 01/27/24 0151  NA 137 136  K 4.2 4.4  CL 108 109  CO2 17* 16*  GLUCOSE 189* 223*  BUN 41* 45*  CREATININE 1.50* 1.56*  CALCIUM  9.3 8.6*   GFR: Estimated Creatinine Clearance: 55.5 mL/min (A) (by C-G formula based on SCr of 1.56 mg/dL (H)). Recent Labs  Lab 01/26/24 1936 01/27/24 0151 01/27/24 1826  WBC 18.2* 21.9*  --   LATICACIDVEN  --   --  2.3*    Assessment and Plan #Severe Sepsis due to suspected Cellulitis of Bilateral Lower Extremities meets SIRS criteria: Heart Rate 115 beats/minute, Respiratory Rate 41 breaths/minute,Temperature 102.9 -F/u cultures, trend lactic/ PCT -Monitor WBC/ fever curve -Initiate IV antibiotics: cefepime  & vancomycin , & Flagyl  -IVF hydration as  needed -Strict I/O's   #Acute Hypoxic Respiratory Failure secondary to probable Decompensated HFrEF  -Will obtain CTA chest to evalaute for PE -Supplemental O2 as needed to maintain O2 saturations 88 to 92% -Will consider BiPAP if no improvement weaning as tolerated -Follow intermittent ABG and chest x-ray as needed -Diurese as able   #Acute on chronic HFrEF (last known EF 30 to 35%) #ACS/NSTEMI~ likely demand #AF/Afib s/p ablation,  Hx: HTN, HLD,intermittent high-grade AV block s/p pacemaker  -trend troponin until peaked -Continue Heparin  gtt -IV Diurectics as blood pressure and renal function permits; currently on Bumex  2mg  IV BID -Hold GMDT in the setting of sepsis and AKI -Please consider cardiology consult during this admission -Repeat 2D Echocardiogram   #AKI on CKD Stage III #Non Anion Gap Metabolic Acidosis with Lactic Acidosis~Improving -Monitor I&O's / urinary output -Follow BMP -Ensure adequate renal perfusion -Avoid nephrotoxic agents as able -Replace electrolytes as indicated -will give 1 amp sodium bicarb push   #Diabetes mellitus -CBGs -Sliding scale insulin  -Follow hyper/hypoglycemia protocol -Hold home Meds       Alonza Arthurs DNP, CCRN, FNP-C, AGACNP-BC Acute Care & Family Nurse Practitioner [facility] PCCM on call pager [phone]

## 2024-01-27 NOTE — Progress Notes (Signed)
 CODE SEPSIS - PHARMACY COMMUNICATION  **Broad Spectrum Antibiotics should be administered within 1 hour of Sepsis diagnosis**  Time Code Sepsis Called/Page Received: 1942  Antibiotics Ordered: cefepime , vanc, metro  Time of 1st antibiotic administration: 2104  Additional action taken by pharmacy: messaged RN Amy@2025 , then called RN Coutney @2050 .  Jearlean Mince said MD asked to hold abx until blood cx were taken and that was the delay  If necessary, Name of Provider/Nurse Contacted:      Scotty Cyphers ,PharmD Clinical Pharmacist  01/27/2024  9:09 PM

## 2024-01-27 NOTE — Significant Event (Signed)
 Rapid Response Event Note   Reason for Call :  Fever, increased work of breathing, tachypnea  Initial Focused Assessment:  Patient in bed with elevated work of breathing, tachypneic in the upper 20s, febrile with temperature over 102 F. Oxygen saturations 100% on 2L. Patient lethargic but verbally responsive. BP with SBP 120s and MAP in mid 80s. HR 116 appears regular.  Interventions:  PRN rectal tylenol  given. Patient readjusted in bed.  Plan of Care:  Patient to remain on 2A for now. NP at bedside and reviewing patient's orders and making changes as needed. Daughter at bedside updated with plan of care.  Event Summary:   Provider Notified: Alonza Arthurs NP Call Time: 07:12 Arrival Time: 07:13 End Time: 07:30  Versie Gores, RN

## 2024-01-27 NOTE — Progress Notes (Signed)
 Progress Note   Patient: Tyler [name] OZH:[id number] DOB: 1945-10-24 DOA: 01/26/2024     0 DOS: the patient was seen and examined on 01/27/2024   Brief hospital course: Taken from H&P.  Aztlan A Wing is a 78 y.o. Caucasian male with medical history significant for HFpEF, osteoarthritis, atrial fibrillation and flutter, coronary artery disease, stage III chronic kidney disease, dyslipidemia and hypertension, as well as type 2 diabetes mellitus, who presented to the emergency room with complaint of dizziness, lightheadedness and confusion that started around 4 PM today when he came back home from work.  He was also experiencing 5/10, nonradiating dull chest pain with no other associated symptoms.  On presentation patient has mild tachycardia.  Labs with BUN of 41, creatinine 1.5, baseline seems to be around 1.3, CO2 of 17, troponin 69>.105 EKG shows A-fib with RVR, PVCs and LBBB. CT head was negative for any acute intracranial abnormality CXR with cardiomegaly and mild central congestion.  Patient was initially started on heparin  infusion and cardiology was consulted.  5/5: Vital stable.  Worsening leukocytosis at 21.9, platelet at 143, none anion gap metabolic acid with worsening of bicarb to 16, BNP significantly elevated at 4043. Troponin continued to increase, currently at 714. Cardiology started him on IV Bumex .  Echocardiogram ordered  Assessment and Plan: * Chest pain Concern of NSTEMI, type I versus type II Increasing troponin, currently at 714.  Continue to have some chest discomfort. Cardiology is on board -Continue with heparin  infusion - Likely will get ischemic workup after diuresing - Continue with aspirin , Plavix  and atorvastatin     Acute on chronic diastolic CHF (congestive heart failure) (HCC) Patient with volume overload, positive JVD and elevated BNP at 4043 Echo cardiogram ordered-pending -Cardiology started him on IV Bumex  2 mg twice daily -Continue with  metolazone  -Daily weight and BMP -Strict intake and output  Acute kidney injury superimposed on chronic kidney disease (HCC) - This is AKI superimposed on stage IIIa chronic kidney disease. - This is likely secondary to renal hypoperfusion due to acute CHF. -Will follow BMP with diuresis.  Type II diabetes mellitus with renal manifestations (HCC) - Will place the patient on supplement coverage with NovoLog . - Holding home glipizide  and metformin .  Dyslipidemia - Continue atorvastatin   Essential hypertension Blood pressure within goal, being maintained with diuretics Not on any other antihypertensives at home -Continue to monitor   Subjective: Patient continued to have some chest discomfort and shortness of breath.  Currently needing 2 L of oxygen with no baseline oxygen use.  Physical Exam: Vitals:   01/27/24 0530 01/27/24 0617 01/27/24 0943 01/27/24 1330  BP: 109/76  116/67 126/73  Pulse: 95 (!) 113 99 (!) 104  Resp: (!) 25 (!) 26 18 (!) 29  Temp:  98.7 F (37.1 C) 99.3 F (37.4 C) 98.7 F (37.1 C)  TempSrc:  Oral Oral Oral  SpO2: 100% 95% 98% 100%  Weight:      Height:       General.  Overweight elderly man, in no acute distress. Pulmonary.  Few basal crackles bilaterally, normal respiratory effort. CV.  Regular rate and rhythm, no JVD, rub or murmur. Abdomen.  Soft, nontender, nondistended, BS positive. CNS.  Alert and oriented .  No focal neurologic deficit. Extremities.  2+ LE edema bilaterally Psychiatry.  Judgment and insight appears normal.   Data Reviewed: Prior data reviewed  Family Communication:   Disposition: Status is: Observation The patient will require care spanning > 2 midnights and should be  moved to inpatient because: Severity of illness  Planned Discharge Destination: Home  DVT prophylaxis.  Heparin  infusion Time spent: 50 minutes  This record has been created using Conservation officer, historic buildings. Errors have been sought and  corrected,but may not always be located. Such creation errors do not reflect on the standard of care.   Author: Luna Salinas, MD 01/27/2024 3:44 PM  For on call review [email or web address].

## 2024-01-27 NOTE — Progress Notes (Addendum)
 Heart Failure Nurse Navigator Progress Note   Patient had a follow-up appointment today at the Advanced Heart Failure Clinic @ [facility].  Rescheduled his appointment since he is currently hospitalized. Spoke with Caralyn Hudson, PA-C @ [facility] about patient appointments. New appointment scheduled for 02/03/24 @ 3:00 PM.  Navigator will sign off at this time.  Celedonio Coil, RN, BSN [facility] Heart Failure Navigator Secure Chat Only

## 2024-01-28 ENCOUNTER — Inpatient Hospital Stay
Admit: 2024-01-28 | Discharge: 2024-01-28 | Disposition: A | Discharge status: Home / Self Care | Attending: Student | Admitting: Student

## 2024-01-28 ENCOUNTER — Ambulatory Visit: Admitting: Podiatry

## 2024-01-28 DIAGNOSIS — N1831 Chronic kidney disease, stage 3a: Secondary | ICD-10-CM | POA: Diagnosis present

## 2024-01-28 DIAGNOSIS — L97522 Non-pressure chronic ulcer of other part of left foot with fat layer exposed: Secondary | ICD-10-CM | POA: Diagnosis not present

## 2024-01-28 DIAGNOSIS — J9621 Acute and chronic respiratory failure with hypoxia: Secondary | ICD-10-CM | POA: Diagnosis present

## 2024-01-28 DIAGNOSIS — I130 Hypertensive heart and chronic kidney disease with heart failure and stage 1 through stage 4 chronic kidney disease, or unspecified chronic kidney disease: Secondary | ICD-10-CM | POA: Diagnosis present

## 2024-01-28 DIAGNOSIS — E1122 Type 2 diabetes mellitus with diabetic chronic kidney disease: Secondary | ICD-10-CM | POA: Diagnosis present

## 2024-01-28 DIAGNOSIS — N179 Acute kidney failure, unspecified: Secondary | ICD-10-CM | POA: Diagnosis not present

## 2024-01-28 DIAGNOSIS — I214 Non-ST elevation (NSTEMI) myocardial infarction: Secondary | ICD-10-CM | POA: Diagnosis present

## 2024-01-28 DIAGNOSIS — I5023 Acute on chronic systolic (congestive) heart failure: Secondary | ICD-10-CM | POA: Diagnosis not present

## 2024-01-28 DIAGNOSIS — L97512 Non-pressure chronic ulcer of other part of right foot with fat layer exposed: Secondary | ICD-10-CM | POA: Diagnosis not present

## 2024-01-28 DIAGNOSIS — M069 Rheumatoid arthritis, unspecified: Secondary | ICD-10-CM | POA: Diagnosis present

## 2024-01-28 DIAGNOSIS — E872 Acidosis, unspecified: Secondary | ICD-10-CM | POA: Diagnosis present

## 2024-01-28 DIAGNOSIS — T83511A Infection and inflammatory reaction due to indwelling urethral catheter, initial encounter: Secondary | ICD-10-CM | POA: Diagnosis present

## 2024-01-28 DIAGNOSIS — E11621 Type 2 diabetes mellitus with foot ulcer: Secondary | ICD-10-CM | POA: Diagnosis not present

## 2024-01-28 DIAGNOSIS — E440 Moderate protein-calorie malnutrition: Secondary | ICD-10-CM | POA: Diagnosis present

## 2024-01-28 DIAGNOSIS — B951 Streptococcus, group B, as the cause of diseases classified elsewhere: Secondary | ICD-10-CM | POA: Diagnosis not present

## 2024-01-28 DIAGNOSIS — I272 Pulmonary hypertension, unspecified: Secondary | ICD-10-CM | POA: Diagnosis present

## 2024-01-28 DIAGNOSIS — I4819 Other persistent atrial fibrillation: Secondary | ICD-10-CM | POA: Diagnosis present

## 2024-01-28 DIAGNOSIS — G9341 Metabolic encephalopathy: Secondary | ICD-10-CM | POA: Diagnosis not present

## 2024-01-28 DIAGNOSIS — I442 Atrioventricular block, complete: Secondary | ICD-10-CM | POA: Diagnosis not present

## 2024-01-28 DIAGNOSIS — I76 Septic arterial embolism: Secondary | ICD-10-CM | POA: Diagnosis present

## 2024-01-28 DIAGNOSIS — I350 Nonrheumatic aortic (valve) stenosis: Secondary | ICD-10-CM | POA: Diagnosis present

## 2024-01-28 DIAGNOSIS — L0390 Cellulitis, unspecified: Secondary | ICD-10-CM | POA: Diagnosis not present

## 2024-01-28 DIAGNOSIS — N170 Acute kidney failure with tubular necrosis: Secondary | ICD-10-CM | POA: Diagnosis present

## 2024-01-28 DIAGNOSIS — M25462 Effusion, left knee: Secondary | ICD-10-CM | POA: Diagnosis not present

## 2024-01-28 DIAGNOSIS — G9340 Encephalopathy, unspecified: Secondary | ICD-10-CM | POA: Diagnosis not present

## 2024-01-28 DIAGNOSIS — Z515 Encounter for palliative care: Secondary | ICD-10-CM | POA: Diagnosis not present

## 2024-01-28 DIAGNOSIS — I21A1 Myocardial infarction type 2: Secondary | ICD-10-CM | POA: Diagnosis present

## 2024-01-28 DIAGNOSIS — D649 Anemia, unspecified: Secondary | ICD-10-CM | POA: Diagnosis present

## 2024-01-28 DIAGNOSIS — I200 Unstable angina: Secondary | ICD-10-CM | POA: Diagnosis not present

## 2024-01-28 DIAGNOSIS — Z66 Do not resuscitate: Secondary | ICD-10-CM | POA: Diagnosis not present

## 2024-01-28 DIAGNOSIS — I5043 Acute on chronic combined systolic (congestive) and diastolic (congestive) heart failure: Secondary | ICD-10-CM | POA: Diagnosis present

## 2024-01-28 DIAGNOSIS — A401 Sepsis due to streptococcus, group B: Secondary | ICD-10-CM | POA: Diagnosis not present

## 2024-01-28 DIAGNOSIS — R7881 Bacteremia: Secondary | ICD-10-CM | POA: Diagnosis not present

## 2024-01-28 DIAGNOSIS — L8990 Pressure ulcer of unspecified site, unspecified stage: Secondary | ICD-10-CM

## 2024-01-28 DIAGNOSIS — I4892 Unspecified atrial flutter: Secondary | ICD-10-CM | POA: Diagnosis present

## 2024-01-28 DIAGNOSIS — J9622 Acute and chronic respiratory failure with hypercapnia: Secondary | ICD-10-CM | POA: Diagnosis present

## 2024-01-28 DIAGNOSIS — R6520 Severe sepsis without septic shock: Secondary | ICD-10-CM | POA: Diagnosis not present

## 2024-01-28 DIAGNOSIS — I472 Ventricular tachycardia, unspecified: Secondary | ICD-10-CM | POA: Diagnosis not present

## 2024-01-28 DIAGNOSIS — J9611 Chronic respiratory failure with hypoxia: Secondary | ICD-10-CM | POA: Diagnosis not present

## 2024-01-28 DIAGNOSIS — Y846 Urinary catheterization as the cause of abnormal reaction of the patient, or of later complication, without mention of misadventure at the time of the procedure: Secondary | ICD-10-CM | POA: Diagnosis present

## 2024-01-28 DIAGNOSIS — Z950 Presence of cardiac pacemaker: Secondary | ICD-10-CM | POA: Diagnosis not present

## 2024-01-28 DIAGNOSIS — I5033 Acute on chronic diastolic (congestive) heart failure: Secondary | ICD-10-CM | POA: Diagnosis not present

## 2024-01-28 LAB — CBC
HCT: 32.6 % — ABNORMAL LOW (ref 39.0–52.0)
Hemoglobin: 10.3 g/dL — ABNORMAL LOW (ref 13.0–17.0)
MCH: 27.6 pg (ref 26.0–34.0)
MCHC: 31.6 g/dL (ref 30.0–36.0)
MCV: 87.4 fL (ref 80.0–100.0)
Platelets: 117 10*3/uL — ABNORMAL LOW (ref 150–400)
RBC: 3.73 MIL/uL — ABNORMAL LOW (ref 4.22–5.81)
RDW: 17.1 % — ABNORMAL HIGH (ref 11.5–15.5)
WBC: 13 10*3/uL — ABNORMAL HIGH (ref 4.0–10.5)
nRBC: 0 % (ref 0.0–0.2)

## 2024-01-28 LAB — BLOOD CULTURE ID PANEL (REFLEXED) - BCID2

## 2024-01-28 LAB — URINALYSIS, COMPLETE (UACMP) WITH MICROSCOPIC
Bacteria, UA: NONE SEEN
Bilirubin Urine: NEGATIVE
Glucose, UA: NEGATIVE mg/dL
Ketones, ur: NEGATIVE mg/dL
Nitrite: NEGATIVE
Protein, ur: NEGATIVE mg/dL
Specific Gravity, Urine: 1.029 (ref 1.005–1.030)
pH: 5 (ref 5.0–8.0)

## 2024-01-28 LAB — BASIC METABOLIC PANEL WITH GFR
Anion gap: 15 (ref 5–15)
BUN: 61 mg/dL — ABNORMAL HIGH (ref 8–23)
CO2: 18 mmol/L — ABNORMAL LOW (ref 22–32)
Calcium: 8.3 mg/dL — ABNORMAL LOW (ref 8.9–10.3)
Chloride: 105 mmol/L (ref 98–111)
Creatinine, Ser: 2.05 mg/dL — ABNORMAL HIGH (ref 0.61–1.24)
GFR, Estimated: 32 mL/min — ABNORMAL LOW (ref >60)
Glucose, Bld: 120 mg/dL — ABNORMAL HIGH (ref 70–99)
Potassium: 3.3 mmol/L — ABNORMAL LOW (ref 3.5–5.1)
Sodium: 138 mmol/L (ref 135–145)

## 2024-01-28 LAB — GLUCOSE, CAPILLARY
Glucose-Capillary: 142 mg/dL — ABNORMAL HIGH (ref 70–99)
Glucose-Capillary: 210 mg/dL — ABNORMAL HIGH (ref 70–99)
Glucose-Capillary: 166 mg/dL — ABNORMAL HIGH (ref 70–99)
Glucose-Capillary: 157 mg/dL — ABNORMAL HIGH (ref 70–99)
Glucose-Capillary: 149 mg/dL — ABNORMAL HIGH (ref 70–99)

## 2024-01-28 LAB — HEPARIN LEVEL (UNFRACTIONATED)
Heparin Unfractionated: 0.2 [IU]/mL — ABNORMAL LOW (ref 0.30–0.70)
Heparin Unfractionated: 0.24 [IU]/mL — ABNORMAL LOW (ref 0.30–0.70)
Heparin Unfractionated: 0.41 [IU]/mL (ref 0.30–0.70)

## 2024-01-28 LAB — TROPONIN I (HIGH SENSITIVITY)
Troponin I (High Sensitivity): 1804 ng/L (ref <18)
Troponin I (High Sensitivity): 1830 ng/L (ref <18)

## 2024-01-28 LAB — MRSA NEXT GEN BY PCR, NASAL: MRSA by PCR Next Gen: NOT DETECTED

## 2024-01-28 MED ORDER — METOPROLOL SUCCINATE ER 25 MG PO TB24
12.5000 mg | ORAL_TABLET | Freq: Every day | ORAL | Status: DC
  Administered 2024-01-28: 12.5 mg via ORAL
  Filled 2024-01-28: qty 1

## 2024-01-28 MED ORDER — HEPARIN BOLUS VIA INFUSION
1700.0000 [IU] | Freq: Once | INTRAVENOUS | Status: AC
  Administered 2024-01-28: 1700 [IU] via INTRAVENOUS
  Filled 2024-01-28: qty 1700

## 2024-01-28 MED ORDER — STERILE WATER FOR INJECTION IV SOLN
INTRAVENOUS | Status: AC
  Filled 2024-01-28: qty 150

## 2024-01-28 MED ORDER — METOLAZONE 2.5 MG PO TABS
2.5000 mg | ORAL_TABLET | Freq: Once | ORAL | Status: AC
  Administered 2024-01-28: 2.5 mg via ORAL
  Filled 2024-01-28: qty 1

## 2024-01-28 MED ORDER — POTASSIUM CHLORIDE CRYS ER 20 MEQ PO TBCR
40.0000 meq | EXTENDED_RELEASE_TABLET | Freq: Once | ORAL | Status: AC
  Administered 2024-01-28: 40 meq via ORAL
  Filled 2024-01-28: qty 2

## 2024-01-28 MED ORDER — FUROSEMIDE 10 MG/ML IJ SOLN
120.0000 mg | Freq: Once | INTRAVENOUS | Status: AC
  Administered 2024-01-28: 120 mg via INTRAVENOUS
  Filled 2024-01-28: qty 12

## 2024-01-28 MED ORDER — PHENYLEPHRINE HCL 0.25 % NA SOLN
1.0000 | Freq: Four times a day (QID) | NASAL | Status: AC | PRN
  Administered 2024-01-28: 1 via NASAL
  Filled 2024-01-28: qty 15

## 2024-01-28 MED ORDER — SODIUM CHLORIDE 0.9 % IV SOLN
2.0000 g | Freq: Every day | INTRAVENOUS | Status: DC
  Administered 2024-01-28 – 2024-02-01 (×5): 2 g via INTRAVENOUS
  Filled 2024-01-28 (×5): qty 20

## 2024-01-28 MED ORDER — HEPARIN BOLUS VIA INFUSION
2000.0000 [IU] | Freq: Once | INTRAVENOUS | Status: AC
  Administered 2024-01-28: 2000 [IU] via INTRAVENOUS
  Filled 2024-01-28: qty 2000

## 2024-01-28 MED ORDER — POTASSIUM CHLORIDE CRYS ER 20 MEQ PO TBCR
20.0000 meq | EXTENDED_RELEASE_TABLET | Freq: Once | ORAL | Status: AC
  Administered 2024-01-28: 20 meq via ORAL
  Filled 2024-01-28: qty 1

## 2024-01-28 NOTE — Assessment & Plan Note (Addendum)
 On admission through 01-28-2024 Patient with volume overload, positive JVD and elevated BNP at 4043 Echo cardiogram ordered-pending -Cardiology started him on IV Bumex  2 mg twice daily which was held today due to worsening creatinine -Advanced heart failure team consulted -Continue with metolazone  -Daily weight and BMP -Strict intake and output  01-29-2024 will let cards and CHF team manage pt's volume status with diuretics, etc.  01-30-2024 management per CHF team. Yesterday echo LVEF 25%  01-31-2024 has 4.7 liters in urine yesterday. Diuresis management by cardiology team. Appears further IV diuretics on hold by cardiology team.  02-01-2024 3.5 liters in urine output. Did not receive any diuretics yesterday. Monitor renal function.   02-02-2024 Scr 1.41, BUN 74 today. Urine output dropped off yesterday. Only 1.2 liters in urine today. Appears from cardiology note today that bumex  is going to be started. Monitor renal function.   02-03-2024 Scr 1.46, BUN 67 today. Pt started yesterday on 2 mg bumex  daily. 2.3 liter in urine output yesterday.   02-04-2024 Scr 1.34, BUN 63 today.  On bumex  2 mg daily per cardiology. 2 liters in urine output yesterday.

## 2024-01-28 NOTE — Consult Note (Signed)
 WOC Nurse Consult Note: patient is followed by podiatry weekly, last seen there 01/21/2024; had wound debridement and placement of skin graft 08/2023 to  L foot  Reason for Consult: foot wounds  Wound type: Full thickness L plantar foot, R plantar aspect base of 5th and 2nd digits diabetic ulcers  Pressure Injury POA: NA not pressure  Measurement: see nursing flowsheet  Wound bed: R foot plantar base of 2nd digit appears dry pink heavily callused, base R 5th digit dry and pink; L foot red  Drainage (amount, consistency, odor) see nursing flowsheet  Periwound: heavy callusing  Dressing procedure/placement/frequency:  Cleanse foot wounds to B feet with NS, apply silver  hydrofiber (Aquacel AG Timm Foot #[id number]) cut to fit wound beds daily.  Cover with dry gauze and Kerlix roll gauze or silicone foam whichever patient prefers.   Per last podiatry note patient is to wear compression hose to keep edema down.  Patient should continue to follow with podiatry and follow their instructions for care of these wounds as an outpatient.    POC discussed with bedside nurse. WOC team will not follow. Re-consult if further needs arise.   Thank you,    Ronni Colace MSN, RN-BC, [employer] [phone]

## 2024-01-28 NOTE — Progress Notes (Signed)
 Progress Note   Patient: Tyler [name] ZOX:[id number] DOB: 09/15/1946 DOA: 01/26/2024     0 DOS: the patient was seen and examined on 01/28/2024   Brief hospital course: Taken from H&P.  Tyler [name] is a 78 y.o. 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  He was also experiencing 5/10, nonradiating dull chest pain with no other associated symptoms.  On presentation patient has mild tachycardia.  Labs with BUN of 41, creatinine 1.5, baseline seems to be around 1.3, CO2 of 17, troponin 69>.105 EKG shows A-fib with RVR, PVCs and LBBB. CT head was negative for any acute intracranial abnormality CXR with cardiomegaly and mild central congestion.  Patient was initially started on heparin  infusion and cardiology was consulted.  5/5: Vital stable.  Worsening leukocytosis at 21.9, platelet at 143, none anion gap metabolic acid with worsening of bicarb to 16, BNP significantly elevated at 4043. Troponin continued to increase, currently at 714. Cardiology started him on IV Bumex .  Echocardiogram ordered  5/6: Rapid response was called overnight when patient became very tachycardic and tachypneic, found to be febrile at 102.9.  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 Blood cultures started growing group B streptococcus this morning-antibiotics are being switched to ceftriaxone .  ID was  consulted.  Patient also has a pacemaker in place. Rising troponin, maximum recorded at 1830, improving leukocytosis, potassium 3.3 and worsening creatinine at 2.05 so further IV Bumex  was held this morning.  Assessment and Plan: * Chest pain Concern of NSTEMI, type I versus type II Increasing troponin, troponin peaked at 1830 .  Continue to have some chest discomfort. Cardiology is on board Echocardiogram pending -Continue with heparin  infusion - Likely will get ischemic workup after diuresing - Continue with aspirin , Plavix  and atorvastatin     Acute on chronic diastolic CHF (congestive heart failure) (HCC) Patient with volume overload, positive JVD and elevated BNP at 4043 Echo cardiogram ordered-pending -Cardiology started him on IV Bumex  2 mg twice daily which was held today due to worsening creatinine -Advanced heart failure team consulted -Continue with metolazone  -Daily weight and BMP -Strict intake and output  Sepsis due to cellulitis (HCC) Group B strep agalactiae bacteremia Patient met sepsis criteria after developing high-grade fever with tachycardia overnight.  Likely due to lower extremity cellulitis and bilateral multiple foot wounds. Received broad-spectrum antibiotics overnight Preliminary blood cultures with group B strep agalactia - De-escalate antibiotics to ceftriaxone  - ID was consulted - Echocardiogram pending   Acute kidney injury superimposed on chronic kidney disease (HCC) Worsening creatinine, AKI on chronic CKD stage IIIa. Worsening non-anion gap metabolic acidosis, multifactorial with AKI, CHF and also concern of sepsis. IV Bumex  was held by cardiology today -Giving gentle IV fluid with bicarb infusion -Monitor renal function -Avoid nephrotoxins  Type II diabetes mellitus with renal manifestations (HCC) - Will place the patient on supplement coverage with NovoLog . - Holding home glipizide  and metformin .  Dyslipidemia - Continue  atorvastatin   Essential hypertension Blood pressure within goal, being maintained with  diuretics Not on any other antihypertensives at home -Continue to monitor   Subjective: Patient was feeling weak and lethargic when seen today.  Physical Exam: Vitals:   01/28/24 1100 01/28/24 1108 01/28/24 1200 01/28/24 1457  BP: 97/73  108/83   Pulse: 94  100   Resp: (!) 23  (!) 36   Temp:  98.3 F (36.8 C)  98.4 F (36.9 C)  TempSrc:  Oral    SpO2:   99%   Weight:      Height:       General.  Frail elderly man, in no acute distress. Pulmonary.  Lungs clear bilaterally, normal respiratory effort. CV.  Regular rate and rhythm, no JVD, rub or murmur. Abdomen.  Soft, nontender, nondistended, BS positive. CNS.  Alert and oriented .  No focal neurologic deficit. Extremities.  2+ LE edema bilaterally with erythema more on right and signs of chronic venous congestion.  Bandages on both feet and missing toes on right  Data Reviewed: Prior data reviewed  Family Communication: Tried calling both daughters listed in his chart with no response  Disposition: Status is: Observation The patient will require care spanning > 2 midnights and should be moved to inpatient because: Severity of illness  Planned Discharge Destination: Home  DVT prophylaxis.  Heparin  infusion Time spent: 50 minutes  This record has been created using Conservation officer, historic buildings. Errors have been sought and corrected,but may not always be located. Such creation errors do not reflect on the standard of care.   Author: Luna Salinas, MD 01/28/2024 3:23 PM  For on call review [email or web address].

## 2024-01-28 NOTE — Consult Note (Signed)
 NAME: Tyler [name]  DOB: [birth year]  MRN: [id number]  Date/Time: 01/28/2024 2:53 PM  REQUESTING PROVIDER: Dr.Amin Subjective:  REASON FOR CONSULT: Group B streptococcus bacteremia ? Tyler [name] is a 78 y.o. 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 When he got home he was feeling unwell . Called EMS around 6 pm  and their note says he had chest pain which was worse than usual due to shortness of breath and was also dizzy Vitals in the ED  01/26/24 19:25  BP 131/72  Temp 99.3 F (37.4 C)  Pulse Rate 112 !  Resp 24 !  SpO2 97 %  O2 Flow Rate (L/min) 2 L/min    Latest Reference Range & Units 01/26/24 19:36  WBC 4.0 - 10.5 K/uL 18.2 (H)  Hemoglobin 13.0 - 17.0 g/dL 40.9 (L)  HCT 81.1 - 91.4 % 36.3 (L)  Platelets 150 - 400 K/uL 164  Creatinine 0.61 - 1.24 mg/dL 7.82 (H)   EKG showed LBBB  CXR cardiomegaly with mild congestion Troponin was elevated BNP was 4043  Admitted as acute on chronic CHF, NSTEMI, PAF and seen by cardiologist and started on heparin  drip, IV diuretics Then on 5/5 a rapid response was called in the evening and he had a temp of 102, tachycardia and SOB- blood culture sent and he was started on broad spectrum antibiotics I am seeing the patient as he has Group B streptococcus bacteremia    Past Medical History:  Diagnosis Date   (HFpEF) heart failure with preserved ejection fraction (HCC) 03/01/2020   a.) TTE 03/01/2020: EF 55-60%, mod MAC, mod AoV sclerosis, triv AR, mild TR, mod MR,  RVSP 50-59; b.) TTE 09/10/2020: EF 55-60%, mod MAC, mod AoV sclerosis, mild TR, 3+ MR, RVSP 50-59; c.) TTE 09/26/2020: EF 55-60%, mild LA dil, triv PR, mild MR/TR, RVSP 37-49   Adenoma of left adrenal gland    Anemia    Arthritis    Atrial fibrillation and flutter (HCC)    a.) CHA2DS2VASc = 5 (age x2, HFpEF, HTN, T2DM);  b.) s/p CTI ablation 09/07/2020; c.) rate/rhythm maintained on oral carvedilol ; not on chronic anticoagulation therapy   CAD (coronary artery disease)    Cardiomegaly    CKD (chronic kidney disease), stage III (HCC)    DOE (dyspnea on exertion)    Drug-induced bradycardia    Gangrene of toe of left foot (HCC)    a.) 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    HLD (hyperlipidemia)    Hypertension    Long term current use of aspirin     Lymphedema of both lower extremities    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)    a.) s/p amputation 11/04/2022   Peripheral vascular disease (HCC)    Pleural effusion on right 09/09/2020   a.) s/p RIGHT thoracentesis with 2180 cc yield   Pneumonia    Presence of permanent cardiac pacemaker 09/10/2020   a.) TVP placement  09/10/2020 due to intermittent CHB in setting of urosepsis; b.) s/p PPM placement 09/15/2020: MDT Azure XT DR (SN: [id number])   Pulmonary hypertension (HCC) 03/01/2020   a.) TTE: 03/01/2020: RVSP 50-59; b.) TTE 09/10/2020: RVSP 50-59; c.) TTE 09/26/2020: RVSP 37-49   RA (rheumatoid arthritis) (HCC)    Rheumatic fever    Sepsis (HCC) 09/10/2020   a.) urosepsis --> BC x 2 sets and UC all grew out significant Proteus mirabilis; admitted to [facility] 09/07/2020 - 09/27/2020.   Sick sinus syndrome [facility])    a.) s/p MDT PPM placement 09/15/2020   T2DM (type 2 diabetes mellitus) (HCC)    Urinary retention    chronic, with indwelling Foley catheter and plans for a suprapubic   Wears dentures    full upper    Past Surgical History:   Procedure Laterality Date   AMPUTATION TOE Right 11/04/2022   Procedure: AMPUTATION TOE;  Surgeon: Dot Gazella, [name];  [name];  Service: Podiatry;  Laterality: Right;   AMPUTATION TOE Right 09/15/2023   Procedure: AMPUTATION TOE;  Surgeon: Jennefer Moats, [name];  [name];  Service: Orthopedics/Podiatry;  Laterality: Right;  Right hallux amputation   BONE BIOPSY Right 04/05/2023   Procedure: BONE BIOPSY THIRD & FOURTH;  Surgeon: Dot Gazella, [name];  [name];  Service: Podiatry;  Laterality: Right;   CARDIAC ELECTROPHYSIOLOGY STUDY AND ABLATION N/A 09/07/2020   Procedure: CARDIAC EP STUDY AND ABLATION (CTI)   CATARACT EXTRACTION W/PHACO Right 01/16/2017   Procedure: CATARACT EXTRACTION PHACO AND INTRAOCULAR LENS PLACEMENT (IOC)  Right Complicated;  Surgeon: Annell Kidney, MD;  Location: [facility];  Service: Ophthalmology;  Laterality: Right;  IVA Block Healon 5 malyugin vision blue Diabetic - oral meds   CATARACT EXTRACTION W/PHACO Left 10/23/2022   Procedure: CATARACT EXTRACTION PHACO AND INTRAOCULAR LENS PLACEMENT (IOC) LEFT DIABETIC;  Surgeon: Clair Crews, MD;  Location: [facility];  Service: Ophthalmology;  Laterality: Left;  Diabetic   COLONOSCOPY     INCISION AND DRAINAGE OF WOUND Left 09/15/2023   Procedure: IRRIGATION AND DEBRIDEMENT WOUND;  Surgeon: Jennefer Moats, [name];  [name];  Service: Orthopedics/Podiatry;  Laterality: Left;  Left foot wound debridement, possible graft/biopsy   LOWER EXTREMITY ANGIOGRAPHY Right 11/02/2022   Procedure: Lower Extremity Angiography;  Surgeon: Celso College, MD;  Location: [facility];  Service: Cardiovascular;  Laterality: Right;   LOWER EXTREMITY ANGIOGRAPHY Right 09/13/2023   Procedure: Lower Extremity Angiography;  Surgeon: Jackquelyn Mass, MD;  Location: [facility] INV[facility];  Service: Cardiovascular;  Laterality: Right;   METATARSAL HEAD EXCISION Left  07/05/2018   Procedure: RESECTION FIRST METATARSAL INFECTED BONE AND SOFT TISSUE;  Surgeon: Sharlyn Deaner, [name];  [name];  Service: Podiatry;  Laterality: Left;   METATARSAL HEAD EXCISION Right 04/05/2023   Procedure: METATARSAL HEAD EXCISION THIRD & FOURTH;  Surgeon: Dot Gazella, [name];  [name];  Service: Podiatry;  Laterality: Right;   PACEMAKER INSERTION  09/15/2020   TOE AMPUTATION Left    TONSILLECTOMY      Social History   Socioeconomic History   Marital status: Single    Spouse name: Not on file   Number of children: Not on file   Years of education: Not on file   Highest education level: Not on file  Occupational History    Comment: [employer]  Tobacco Use   Smoking status: Never   Smokeless tobacco: Never  Vaping Use  Vaping status: Never Used  Substance and Sexual Activity   Alcohol use: Not Currently    Comment: rarely   Drug use: Never   Sexual activity: Not Currently    Birth control/protection: None  Other Topics Concern   Not on file  Social History Narrative   Lives with roommate   Social Drivers of Health   Financial Resource Strain: Low Risk  (03/20/2023)   Received from [facility]   Overall Financial Resource Strain (CARDIA)    Difficulty of Paying Living Expenses: Not hard at all  Food Insecurity: No Food Insecurity (01/27/2024)   Hunger Vital Sign    Worried About Running Out of Food in the Last Year: Never true    Ran Out of Food in the Last Year: Never true  Transportation Needs: No Transportation Needs (01/27/2024)   PRAPARE - Administrator, Civil Service (Medical): No    Lack of Transportation (Non-Medical): No  Physical Activity: Not on file  Stress: No Stress Concern Present (09/07/2020)   Received from [facility] - Occupational Stress Questionnaire    Feeling of Stress : Not at all  Social Connections: Socially  Isolated (01/27/2024)   Social Connection and Isolation Panel [NHANES]    Frequency of Communication with Friends and Family: More than three times a week    Frequency of Social Gatherings with Friends and Family: Twice a week    Attends Religious Services: Never    Database administrator or Organizations: No    Attends Banker Meetings: Never    Marital Status: Divorced  Catering manager Violence: Not At Risk (01/27/2024)   Humiliation, Afraid, Rape, and Kick questionnaire    Fear of Current or Ex-Partner: No    Emotionally Abused: No    Physically Abused: No    Sexually Abused: No    Family History  Problem Relation Age of Onset   Cancer Niece    Allergies  Allergen Reactions   Quinolones Other (See Comments)    Patient has AAA - fluoroquinolones are contraindicated due to risk of aortic rupture.   Eliquis  [Apixaban ]     Dizziness  and vision change   Spironolactone  Other (See Comments)    dizziness   I? Current Facility-Administered Medications  Medication Dose Route Frequency Provider Last Rate Last Admin   acetaminophen  (OFIRMEV ) IV 1,000 mg  1,000 mg Intravenous Q6H Gideon Kussmaul, NP 400 mL/hr at 01/28/24 1304 1,000 mg at 01/28/24 1304   acetaminophen  (TYLENOL ) tablet 650 mg  650 mg Oral Q6H PRN Mansy, Jan A, MD       Or   acetaminophen  (TYLENOL ) suppository 650 mg  650 mg Rectal Q6H PRN Mansy, Jan A, MD       aspirin  EC tablet 81 mg  81 mg Oral Daily Mansy, Jan A, MD   81 mg at 01/28/24 2440   atorvastatin  (LIPITOR) tablet 80 mg  80 mg Oral q1800 Mansy, Jan A, MD   80 mg at 01/27/24 1839   cefTRIAXone  (ROCEPHIN ) 2 g in sodium chloride  0.9 % 100 mL IVPB  2 g Intravenous Q1400 Amin, Sumayya, MD       Chlorhexidine  Gluconate Cloth 2 % PADS 6 each  6 each Topical Daily Mansy, Jan A, MD   6 each at 01/28/24 1314   clopidogrel  (PLAVIX ) tablet 75 mg  75 mg Oral Q breakfast Mansy, Anastasio Kaska, MD   75 mg  at 01/28/24 0837   furosemide  (LASIX ) 120 mg in dextrose  5 %  50 mL IVPB  120 mg Intravenous Once Diaz, Alma L, NP 62 mL/hr at 01/28/24 1422 120 mg at 01/28/24 1422   gentamicin  cream (GARAMYCIN ) 0.1 % 1 Application  1 Application Topical BID Mansy, Jan A, MD   1 Application at 01/28/24 1222   heparin  ADULT infusion 100 units/mL (25000 units/250mL)  2,300 Units/hr Intravenous Continuous Trinidad Funk, RPH 20 mL/hr at 01/28/24 0720 2,000 Units/hr at 01/28/24 0720   heparin  bolus via infusion 2,000 Units  2,000 Units Intravenous Once Patel, Kishan S, RPH       insulin  aspart (novoLOG ) injection 0-15 Units  0-15 Units Subcutaneous TID WC Mansy, Anastasio Kaska, MD   5 Units at 01/28/24 1233   insulin  aspart (novoLOG ) injection 0-5 Units  0-5 Units Subcutaneous QHS Mansy, Jan A, MD       magnesium  hydroxide (MILK OF MAGNESIA) suspension 30 mL  30 mL Oral Daily PRN Mansy, Jan A, MD       multivitamin with minerals tablet 1 tablet  1 tablet Oral Daily Mansy, Jan A, MD   1 tablet at 01/28/24 1610   nitroGLYCERIN  (NITROSTAT ) SL tablet 0.4 mg  0.4 mg Sublingual Q5 min PRN Mansy, Jan A, MD   0.4 mg at 01/27/24 9604   ondansetron  (ZOFRAN ) tablet 4 mg  4 mg Oral Q6H PRN Mansy, Jan A, MD       Or   ondansetron  (ZOFRAN ) injection 4 mg  4 mg Intravenous Q6H PRN Mansy, Jan A, MD       silver  sulfADIAZINE  (SILVADENE ) 1 % cream   Topical Daily Mansy, Jan A, MD   Given at 01/28/24 1217   traZODone  (DESYREL ) tablet 25 mg  25 mg Oral QHS PRN Mansy, Jan A, MD         Abtx:  Anti-infectives (From admission, onward)    Start     Dose/Rate Route Frequency Ordered Stop   01/28/24 1600  vancomycin  (VANCOREADY) IVPB 2000 mg/400 mL  Status:  Discontinued        2,000 mg 200 mL/hr over 120 Minutes Intravenous Every 24 hours 01/27/24 2009 01/28/24 0844   01/28/24 1600  cefTRIAXone  (ROCEPHIN ) 2 g in sodium chloride  0.9 % 100 mL IVPB        2 g 200 mL/hr over 30 Minutes Intravenous Daily 01/28/24 0844     01/28/24 0800  ceFEPIme  (MAXIPIME ) 2 g in sodium chloride  0.9 % 100 mL IVPB  Status:   Discontinued        2 g 200 mL/hr over 30 Minutes Intravenous Every 12 hours 01/27/24 1950 01/28/24 0844   01/27/24 2030  ceFEPIme  (MAXIPIME ) 2 g in sodium chloride  0.9 % 100 mL IVPB        2 g 200 mL/hr over 30 Minutes Intravenous  Once 01/27/24 1942 01/27/24 2242   01/27/24 2030  metroNIDAZOLE  (FLAGYL ) IVPB 500 mg  Status:  Discontinued        500 mg 100 mL/hr over 60 Minutes Intravenous Every 12 hours 01/27/24 1942 01/28/24 0844   01/27/24 2030  vancomycin  (VANCOCIN ) IVPB 1000 mg/200 mL premix  Status:  Discontinued        1,000 mg 200 mL/hr over 60 Minutes Intravenous  Once 01/27/24 1942 01/27/24 1948   01/27/24 1945  vancomycin  (VANCOREADY) IVPB 2000 mg/400 mL        2,000 mg 200 mL/hr over 120 Minutes Intravenous  Once 01/27/24 1948  01/27/24 2304   01/26/24 2300  doxycycline  (VIBRA -TABS) tablet 100 mg  Status:  Discontinued        100 mg Oral 2 times daily 01/26/24 2254 01/26/24 2346       REVIEW OF SYSTEMS:  Const:  fever, in the hospital   chills, negative weight loss Eyes: negative diplopia or visual changes, negative eye pain ENT: negative coryza, negative sore throat Resp: negative cough, hemoptysis, has dyspnea Cards:has  chest pain, palpitations, lower extremity edema GU: negative for frequency, dysuria and hematuria GI: Negative for abdominal pain, diarrhea, bleeding, constipation Skin: negative for rash and pruritus Heme: negative for easy bruising and gum/nose bleeding MS:  weakness Neurolo:negative for headaches, had dizziness, confusion Psych: negative for feelings of anxiety, depression  Endocrine:  diabetes Allergy/Immunology- spirinolactone, eliquis , quiniline ( due to AAA) Objective:  VITALS:  BP 103/75   Pulse 92   Temp 98.3 F (36.8 C) (Oral)   Resp (!) 29   Ht 6\' 6"  (1.981 m)   Wt 118.3 kg   SpO2 97%   BMI 30.14 kg/m   PHYSICAL EXAM:  General: Alert, cooperative, no distress, appears stated age.  Head: Normocephalic, without obvious  abnormality, atraumatic. Eyes: Conjunctivae clear, anicteric sclerae. Pupils are equal ENT Nares normal. No drainage or sinus tenderness. Lips, mucosa, and tongue normal. No Thrush Neck: Supple, symmetrical, no adenopathy, thyroid: non tender no carotid bruit and no JVD. Back: No CVA tenderness. Lungs: b/l air entry crepts bases  Heart: irregular Abdomen: Soft, non-tender,not distended. Bowel sounds normal. No masses Extremities: edema legs left > rt Left multiple toes amputated except 2 nd toe Rt great toe amputation Wounds on the bottom of both feet                   Skin: No rashes or lesions. Or bruising Lymph: Cervical, supraclavicular normal. Neurologic: Grossly non-focal Pertinent Labs Lab Results CBC    Component Value Date/Time   WBC 13.0 (H) 01/28/2024 0401   RBC 3.73 (L) 01/28/2024 0401   HGB 10.3 (L) 01/28/2024 0401   HGB 11.1 (L) 07/09/2014 0452   HCT 32.6 (L) 01/28/2024 0401   HCT 34.3 (L) 07/09/2014 0452   PLT 117 (L) 01/28/2024 0401   PLT 358 07/09/2014 0452   MCV 87.4 01/28/2024 0401   MCV 90 07/09/2014 0452   MCH 27.6 01/28/2024 0401   MCHC 31.6 01/28/2024 0401   RDW 17.1 (H) 01/28/2024 0401   RDW 12.4 07/09/2014 0452   LYMPHSABS 0.5 (L) 01/26/2024 1936   LYMPHSABS 1.5 07/09/2014 0452   MONOABS 1.3 (H) 01/26/2024 1936   MONOABS 0.7 07/09/2014 0452   EOSABS 0.1 01/26/2024 1936   EOSABS 0.3 07/09/2014 0452   BASOSABS 0.1 01/26/2024 1936   BASOSABS 0.1 07/09/2014 0452       Latest Ref Rng & Units 01/28/2024    4:01 AM 01/27/2024    8:43 PM 01/27/2024    1:51 AM  CMP  Glucose 70 - 99 mg/dL 161  096  045   BUN 8 - 23 mg/dL 61  61  45   Creatinine 0.61 - 1.24 mg/dL 4.09  8.11  9.14   Sodium 135 - 145 mmol/L 138  136  136   Potassium 3.5 - 5.1 mmol/L 3.3  4.0  4.4   Chloride 98 - 111 mmol/L 105  108  109   CO2 22 - 32 mmol/L 18  19  16    Calcium  8.9 - 10.3 mg/dL 8.3  8.8  8.6   Total Protein 6.5 - 8.1 g/dL  6.9    Total Bilirubin  0.0 - 1.2 mg/dL  1.2    Alkaline Phos 38 - 126 U/L  58    AST 15 - 41 U/L  28    ALT 0 - 44 U/L  22        Microbiology: Recent Results (from the past 240 hours)  Culture, blood (x 2)     Status: None (Preliminary result)   Collection Time: 01/27/24  8:43 PM   Specimen: BLOOD  Result Value Ref Range Status   Specimen Description   Final    BLOOD BLOOD LEFT ARM LAC Performed at [facility], [address], [place], [place] [address]    Special Requests   Final    BOTTLES DRAWN AEROBIC AND ANAEROBIC Blood Culture adequate volume Performed at [facility], [address], [place], [place] [address]    Culture  Setup Time   Final    GRAM POSITIVE COCCI IN BOTH AEROBIC AND ANAEROBIC BOTTLES CRITICAL RESULT CALLED TO, READ BACK BY AND VERIFIED WITH: Kathyanne Parkers GREENWOOD 01/28/24 0811 MW GRAM STAIN REVIEWED-AGREE WITH RESULT DRT Performed at [facility], [address], [place], [place] [address]    Culture GRAM POSITIVE COCCI IN CHAINS  Final   Report Status PENDING  Incomplete  Culture, blood (x 2)     Status: None (Preliminary result)   Collection Time: 01/27/24  8:49 PM   Specimen: BLOOD  Result Value Ref Range Status   Specimen Description   Final    BLOOD BLOOD LEFT HAND [facility] Performed at [facility], [address], [place], [place] [address]    Special Requests   Final    BOTTLES DRAWN AEROBIC AND ANAEROBIC Blood Culture adequate volume Performed at [facility], [address] [place], [place] [address]    Culture  Setup Time   Final    GRAM POSITIVE COCCI IN BOTH AEROBIC AND ANAEROBIC BOTTLES Organism ID to follow CRITICAL RESULT CALLED TO, READ BACK BY AND VERIFIED WITH: Kathyanne Parkers GREENWOOD 01/28/24 0811 MW GRAM STAIN REVIEWED-AGREE WITH RESULT DRT Performed at [facility], [address] [place], [place] [address]    Culture GRAM POSITIVE COCCI IN CHAINS  Final   Report Status PENDING  Incomplete  Blood Culture ID  Panel (Reflexed)     Status: Abnormal   Collection Time: 01/27/24  8:49 PM  Result Value Ref Range Status   Enterococcus faecalis NOT DETECTED NOT DETECTED Final   Enterococcus Faecium NOT DETECTED NOT DETECTED Final   Listeria monocytogenes NOT DETECTED NOT DETECTED Final   Staphylococcus species NOT DETECTED NOT DETECTED Final   Staphylococcus aureus (BCID) NOT DETECTED NOT DETECTED Final   Staphylococcus epidermidis NOT DETECTED NOT DETECTED Final   Staphylococcus lugdunensis NOT DETECTED NOT DETECTED Final   Streptococcus species DETECTED (A) NOT DETECTED Final    Comment: CRITICAL RESULT CALLED TO, READ BACK BY AND VERIFIED WITH: TREY GREENWOOD 01/28/24 0811 MW    Streptococcus agalactiae DETECTED (A) NOT DETECTED Final    Comment: CRITICAL RESULT CALLED TO, READ BACK BY AND VERIFIED WITH: TREY GREENWOOD 01/28/24 0811 MW    Streptococcus pneumoniae NOT DETECTED NOT DETECTED Final   Streptococcus pyogenes NOT DETECTED NOT DETECTED Final   A.calcoaceticus-baumannii NOT DETECTED NOT DETECTED Final   Bacteroides fragilis NOT DETECTED NOT DETECTED Final   Enterobacterales NOT DETECTED NOT DETECTED Final   Enterobacter cloacae complex NOT DETECTED  NOT DETECTED Final   Escherichia coli NOT DETECTED NOT DETECTED Final   Klebsiella aerogenes NOT DETECTED NOT DETECTED Final   Klebsiella oxytoca NOT DETECTED NOT DETECTED Final   Klebsiella pneumoniae NOT DETECTED NOT DETECTED Final   Proteus species NOT DETECTED NOT DETECTED Final   Salmonella species NOT DETECTED NOT DETECTED Final   Serratia marcescens NOT DETECTED NOT DETECTED Final   Haemophilus influenzae NOT DETECTED NOT DETECTED Final   Neisseria meningitidis NOT DETECTED NOT DETECTED Final   Pseudomonas aeruginosa NOT DETECTED NOT DETECTED Final   Stenotrophomonas maltophilia NOT DETECTED NOT DETECTED Final   Candida albicans NOT DETECTED NOT DETECTED Final   Candida auris NOT DETECTED NOT DETECTED Final   Candida glabrata NOT  DETECTED NOT DETECTED Final   Candida krusei NOT DETECTED NOT DETECTED Final   Candida parapsilosis NOT DETECTED NOT DETECTED Final   Candida tropicalis NOT DETECTED NOT DETECTED Final   Cryptococcus neoformans/gattii NOT DETECTED NOT DETECTED Final    Comment: Performed at [facility], [address] [place], [place] [address]  MRSA Next Gen by PCR, Nasal     Status: None   Collection Time: 01/28/24  1:00 AM   Specimen: Nasal Mucosa; Nasal Swab  Result Value Ref Range Status   MRSA by PCR Next Gen NOT DETECTED NOT DETECTED Final    Comment: (NOTE) The GeneXpert MRSA Assay (FDA approved for NASAL specimens only), is one component of a comprehensive MRSA colonization surveillance program. It is not intended to diagnose MRSA infection nor to guide or monitor treatment for MRSA infections. Test performance is not FDA approved in patients less than 92 years old. Performed at [facility], [address] [place], [place] [address]     IMAGING RESULTS: CXR Cardiomegaly with congestion CT chest Aortic aneurysm of 44mm  I have personally reviewed the films ? Impression/Recommendation  CHF/NSTEMI management as per primary team   Streptococcus group B bacteremia 4/4 high bioburden With pacemaker need to r/o endocarditis and wire vegetation Need 2 d echo/TEE Source is foot wounds  B/l feet wounds Recent debridement could have cause bacteremia Will do culture  H/o multiple toe amputation on the rt foot Left great toe amputation  B/l venous edema with pigmentation  Left > rt   Afib H/o aflutter, cardiac ablation   PPM EKG LBBB / Afib  ? AKI on CKD Check bladder scan for residual urine As he has h/o urinary retention BPH? ? I have personally spent  -75--minutes involved in face-to-face and non-face-to-face activities for this patient on the day of the visit. Professional time spent includes the following activities: Preparing to see the patient  (review of tests), Obtaining and/or reviewing separately obtained history (admission/discharge record), Performing a medically appropriate examination and/or evaluation , Ordering medications/tests/procedures, referring and communicating with other health care professionals, Documenting clinical information in the EMR, Independently interpreting results (not separately reported), Communicating results to the patient/family/caregiver, Counseling and educating the patient/family/caregiver and Care coordination (not separately reported).    ____This consultation involved complex antimicrobial management ____________________________________________  Note:  This document was prepared using Dragon voice recognition software and may include unintentional dictation errors.

## 2024-01-28 NOTE — Progress Notes (Signed)
 ANTICOAGULATION CONSULT NOTE  Pharmacy Consult for heparin  infusion Indication: ACS/STEMI  Allergies  Allergen Reactions   Quinolones Other (See Comments)    Patient has aortic aneurysm - fluoroquinolones are contraindicated due to risk of aortic rupture.   Eliquis  [Apixaban ]     Dizziness  and vision change   Spironolactone  Other (See Comments)    dizziness    Patient Measurements: Height: 6\' 6"  (198.1 cm) Weight: 118.3 kg (260 lb 12.8 oz) IBW/kg (Calculated) : 91.4 Heparin  Dosing Weight: 115.5 kg  Vital Signs: Temp: 98.4 F (36.9 C) (05/06 2000) Temp Source: Oral (05/06 2000) BP: 104/81 (05/06 2100) Pulse Rate: 93 (05/06 2100)  Labs: Recent Labs    01/26/24 1936 01/26/24 1938 01/26/24 2133 01/27/24 0151 01/27/24 0844 01/27/24 2043 01/28/24 0011 01/28/24 0401 01/28/24 1340 01/28/24 2256  HGB 11.5*  --   --  11.0*  --   --   --  10.3*  --   --   HCT 36.3*  --   --  34.6*  --   --   --  32.6*  --   --   PLT 164  --   --  143*  --   --   --  117*  --   --   APTT  --  31  --   --   --  72*  --   --   --   --   LABPROT  --   --   --   --   --  18.2*  --   --   --   --   INR  --   --   --   --   --  1.5*  --   --   --   --   HEPARINUNFRC  --   --   --   --    < >  --   --  0.20* 0.24* 0.41  CREATININE 1.50*  --   --  1.56*  --  1.82*  --  2.05*  --   --   TROPONINIHS 69*  --    < > 326*   < > 1,336* 1,830* 1,804*  --   --    < > = values in this interval not displayed.    Estimated Creatinine Clearance: 42.2 mL/min (A) (by C-G formula based on SCr of 2.05 mg/dL (H)).   Medical History: Past Medical History:  Diagnosis Date   (HFpEF) heart failure with preserved ejection fraction (HCC) 03/01/2020   a.) TTE 03/01/2020: EF 55-60%, mod MAC, mod AoV sclerosis, triv AR, mild TR, mod MR, RVSP 50-59; b.) TTE 09/10/2020: EF 55-60%, mod MAC, mod AoV sclerosis, mild TR, 3+ MR, RVSP 50-59; c.) TTE 09/26/2020: EF 55-60%, mild LA dil, triv PR, mild MR/TR, RVSP 37-49    Adenoma of left adrenal gland    Anemia    Arthritis    Atrial fibrillation and flutter (HCC)    a.) CHA2DS2VASc = 5 (age x2, HFpEF, HTN, T2DM);  b.) s/p CTI ablation 09/07/2020; c.) rate/rhythm maintained on oral carvedilol ; not on chronic anticoagulation therapy   CAD (coronary artery disease)    Cardiomegaly    CKD (chronic kidney disease), stage III (HCC)    DOE (dyspnea on exertion)    Drug-induced bradycardia    Gangrene of toe of left foot (HCC)    a.) 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  HLD (hyperlipidemia)    Hypertension    Long term current use of aspirin     Lymphedema of both lower extremities    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)    a.) s/p amputation 11/04/2022   Peripheral vascular disease (HCC)    Pleural effusion on right 09/09/2020   a.) s/p RIGHT thoracentesis with 2180 cc yield   Pneumonia    Presence of permanent cardiac pacemaker 09/10/2020   a.) TVP placement 09/10/2020 due to intermittent CHB in setting of urosepsis; b.) s/p PPM placement 09/15/2020: MDT Azure XT DR (SN: [id number])   Pulmonary hypertension (HCC) 03/01/2020   a.) TTE: 03/01/2020: RVSP 50-59; b.) TTE 09/10/2020: RVSP 50-59; c.) TTE 09/26/2020: RVSP 37-49   RA (rheumatoid arthritis) (HCC)    Rheumatic fever    Sepsis (HCC) 09/10/2020   a.) urosepsis --> BC x 2 sets and UC all grew out significant Proteus mirabilis; admitted to [facility] 09/07/2020 - 09/27/2020.   Sick sinus syndrome (HCC)    a.) s/p MDT PPM placement 09/15/2020   T2DM (type 2 diabetes mellitus) (HCC)    Urinary retention    chronic, with indwelling Foley catheter and plans for a suprapubic   Wears dentures    full upper    Assessment: Pt is a 78 yo male presenting to ED complaining of feeling of dizziness and confusion that started around 4:00 PM today when he got home from work, found with peak trop at 1.8K. No DOAC PTA.  PMH: HFpEF, OA, afib/flutter, pacemaker, CAD, CKD 3, HLD, HTN, DM.   5/5 08:44  HL 0.27  SUBtherapeutic 5/5 1826 HL 0.39    Therapeutic x 1 5/6 0401 HL 0.20    SUBtherapeutic  5/6 1340 HL 0.24  5/6 2256 HL 0.41    Therapeutic X 1   Goal of Therapy:  Heparin  level 0.3-0.7 units/ml Monitor platelets by anticoagulation protocol: Yes   Plan:  5/6:  HL @ 2256 = 0.41, therapeutic X 1 - Will continue pt on current rate and recheck HL in 8 hrs on 5/7 @ 0700. - CBC daily   Charmeka Freeburg D, PharmD Clinical Pharmacist 01/28/2024

## 2024-01-28 NOTE — Progress Notes (Signed)
 ANTICOAGULATION CONSULT NOTE  Pharmacy Consult for heparin  infusion Indication: ACS/STEMI  Allergies  Allergen Reactions   Quinolones Other (See Comments)    Patient has AAA - fluoroquinolones are contraindicated due to risk of aortic rupture.   Eliquis  [Apixaban ]     Dizziness  and vision change   Spironolactone  Other (See Comments)    dizziness    Patient Measurements: Height: 6\' 6"  (198.1 cm) Weight: 118.3 kg (260 lb 12.8 oz) IBW/kg (Calculated) : 91.4 Heparin  Dosing Weight: 115.5 kg  Vital Signs: Temp: 98.3 F (36.8 C) (05/06 1108) Temp Source: Oral (05/06 1108) BP: 103/75 (05/06 0900) Pulse Rate: 92 (05/06 0900)  Labs: Recent Labs    01/26/24 1936 01/26/24 1938 01/26/24 2133 01/27/24 0151 01/27/24 0844 01/27/24 1826 01/27/24 2043 01/28/24 0011 01/28/24 0401 01/28/24 1340  HGB 11.5*  --   --  11.0*  --   --   --   --  10.3*  --   HCT 36.3*  --   --  34.6*  --   --   --   --  32.6*  --   PLT 164  --   --  143*  --   --   --   --  117*  --   APTT  --  31  --   --   --   --  72*  --   --   --   LABPROT  --   --   --   --   --   --  18.2*  --   --   --   INR  --   --   --   --   --   --  1.5*  --   --   --   HEPARINUNFRC  --   --   --   --    < > 0.39  --   --  0.20* 0.24*  CREATININE 1.50*  --   --  1.56*  --   --  1.82*  --  2.05*  --   TROPONINIHS 69*  --    < > 326*   < >  --  1,336* 1,830* 1,804*  --    < > = values in this interval not displayed.    Estimated Creatinine Clearance: 42.2 mL/min (A) (by C-G formula based on SCr of 2.05 mg/dL (H)).   Medical History: Past Medical History:  Diagnosis Date   (HFpEF) heart failure with preserved ejection fraction (HCC) 03/01/2020   a.) TTE 03/01/2020: EF 55-60%, mod MAC, mod AoV sclerosis, triv AR, mild TR, mod MR, RVSP 50-59; b.) TTE 09/10/2020: EF 55-60%, mod MAC, mod AoV sclerosis, mild TR, 3+ MR, RVSP 50-59; c.) TTE 09/26/2020: EF 55-60%, mild LA dil, triv PR, mild MR/TR, RVSP 37-49   Adenoma of left  adrenal gland    Anemia    Arthritis    Atrial fibrillation and flutter (HCC)    a.) CHA2DS2VASc = 5 (age x2, HFpEF, HTN, T2DM);  b.) s/p CTI ablation 09/07/2020; c.) rate/rhythm maintained on oral carvedilol ; not on chronic anticoagulation therapy   CAD (coronary artery disease)    Cardiomegaly    CKD (chronic kidney disease), stage III (HCC)    DOE (dyspnea on exertion)    Drug-induced bradycardia    Gangrene of toe of left foot (HCC)    a.) 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  HLD (hyperlipidemia)    Hypertension    Long term current use of aspirin     Lymphedema of both lower extremities    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)    a.) s/p amputation 11/04/2022   Peripheral vascular disease (HCC)    Pleural effusion on right 09/09/2020   a.) s/p RIGHT thoracentesis with 2180 cc yield   Pneumonia    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   Sick sinus syndrome (HCC)    a.) s/p MDT PPM placement 09/15/2020   T2DM (type 2 diabetes mellitus) (HCC)    Urinary retention    chronic, with indwelling Foley catheter and plans for a suprapubic   Wears dentures    full upper    Assessment: Pt is a 78 yo male presenting to ED complaining of feeling of dizziness and confusion that started around 4:00 PM today when he got home from work, found with peak trop at 1.8K. No DOAC PTA. PMH: HFpEF, OA,  afib/flutter, pacemaker, CAD, CKD 3, HLD, HTN, DM.   5/5 08:44  HL 0.27  SUBtherapeutic 5/5 1826 HL 0.39    Therapeutic x 1 5/6 0401 HL 0.20    SUBtherapeutic  5/6 1340 HL 0.24   Goal of Therapy:  Heparin  level 0.3-0.7 units/ml Monitor platelets by anticoagulation protocol: Yes   Plan:  Heparin  level is subtherapeutic. Will give heparin  bolus of 2000 units x 1 and increase heparin  infusion to 2300 units/hr. Recheck heparin  level in 8 hours. CBC daily while on heparin .   Trinidad Funk, PharmD, BCPS Clinical Pharmacist 01/28/2024

## 2024-01-28 NOTE — Assessment & Plan Note (Signed)
>>  ASSESSMENT AND PLAN FOR CHEST PAIN WRITTEN ON 01/28/2024  3:19 PM BY AMIN, SUMAYYA, MD  Concern of NSTEMI, type I versus type II Increasing troponin, troponin peaked at 1830 .  Continue to have some chest discomfort. Cardiology is on board Echocardiogram pending -Continue with heparin  infusion - Likely will get ischemic workup after diuresing - Continue with aspirin , Plavix  and atorvastatin

## 2024-01-28 NOTE — Progress Notes (Signed)
 OT Cancellation Note  Patient Details Name: Tyler [name] MRN: [id number] DOB: [birth year]   Cancelled Treatment:    Reason Eval/Treat Not Completed: Other (comment)attempted to see patient, he reports he is too tired today, declines all mobility and requests OT follow up later. OT will follow up tomorrow for evaluation, pt is in agreement. Confirmed pt has his tennis shoes in the room as recommended by last podiatry note. OT will follow up as able.   Gerre Kraft, OTD OTR/L  01/28/24, 3:22 PM

## 2024-01-28 NOTE — Progress Notes (Signed)
 PT Cancellation Note  Patient Details Name: Tyler [name] MRN: [id number] DOB: Mar 15, 1946   Cancelled Treatment:    Reason Eval/Treat Not Completed: Pain limiting ability to participate. Orders received and chart reviewed. Upon entry pt declining PT participation due to L knee pain/concerns it may buckle. Encouraged to at least attempt transfers to recliner but pt continuing to decline. Asking PT to return in the morning and willing to participate then. PT to re-attempt as medically appropriate.    Marc Senior. Fairly IV, PT, DPT Physical Therapist- [facility]  01/28/2024, 2:02 PM

## 2024-01-28 NOTE — Consult Note (Cosign Needed Addendum)
 Advanced Heart Failure Team Consult Note   Primary Physician: McClanahan, Kyra, NP Cardiologist:  Dr. Bob Burn, [facility]  Reason for Consultation: A/C HFrEF, NSTEMI, Sepsis  HPI:    Tyler [name] is seen today for evaluation of acute on chronic systolic heart failure and NSTEMI at the request of Rudi Corwin, [facility].   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    Cardiac history dates back to 06/21 when he was first diagnosed with HF and atrial fibrillation.    Admitted 10/30/22 w/ infection of right third toe. R foot x-ray showed soft tissue edema overlies the dorsum of the foot. No soft tissue gas or radiopaque foreign body. No radiographic findings of osteomyelitis. IV antibiotics given. ABI abn. Vascular consulted. RLE angiography done. Amputation of R 3rd toe done. Post-op had significant epistaxis L nare requiring insertion rhino rocket packing overnight. Admitted 10/24 w/ SOB and worsening leg edema. Chronic diabetic foot ulcers. Found to have cellulitis involving the R lower extremity along with an elevated BNP. Patient was seen by podiatrist and underwent BLE imaging: did not  show any evidence of underlying osteomyelitis. Cardiology consulted. IV diuresed with transition to oral diuretics.    Was in the ED 08/03/23 w/ SOB and worsening pedal edema. CXR  with pulmonary edema. No STEMI. Duiresed. Admitted 08/13/23 due to acute worsening of his SOB with associated orthopnea and PND. Cardiology consulted. IV lasix  given and then transitioned to an increased oral lasix  dose.    Admitted 09/11/23 w/ productive cough, worsening SOB, orthopnea, fatigue  and 10 pound weight gain. Also w/ oozing from wounds on bottom of his feet. Found to have HF exacerbation, pneumonia, infected plantar ulcers on both feel and right foot osteomyelitis. Podiatry consulted. Bilateral foot MRI showed septic arthritis of right hallux IPJ. Subsequent amputation of right hallux done. Left foot wound had I & D done. Vascular saw patient and had right lower extremity arteriogram done with subsequent angioplasty and stent placement of right SFA and popliteal arteries.  He presented to the ED yesterday with increased SOB. He reports chest pain 5/10 which is his baseline that he has had for years. Reports BLE swelling, had been wearing compression socks at home. Reports compliance with medications. Admission labs reviewed: K 4.2, SCr 1.5, HsTrop 69>105>326>714>1336>1830>1804. AST/ALT WNL. EKG with a fib RVR 112 bpm with PVCs. BNP 4K. WBC 18.2. Lactic acid 2.3>1.5>1.4. Blood cultures with step agalactiae. Rapid response called yesterday with increased SOB, tachycardia and febrile to 102.9. IV abx started. SCr up to 2 today.   Feels better today. Vital stable.   Cardiac studies reviewed:  Echo 07/03/23: EF 30-35% with moderate Tyler Echo 09/20/20: EF 55-60% with 3+ TR  Home Medications Prior to Admission medications   Medication Sig Start Date End Date Taking? Authorizing Provider  acetaminophen  (TYLENOL ) 500 MG tablet Take 2 tablets (1,000 mg total) by mouth 3 (three) times daily as needed (for pain). 08/18/23  Yes Garrison Kanner, MD  aspirin  EC 81 MG tablet Take 1 tablet (81 mg total) by mouth daily. Swallow whole. 11/07/22  Yes Alexander, Natalie, DO  atorvastatin  (LIPITOR) 10 MG tablet Take 1 tablet (10 mg total) by mouth daily at 6  PM. 10/17/23 01/26/24 Yes Shawnee Dellen A, FNP  bumetanide  (BUMEX ) 2 MG tablet Take 1 tablet (2 mg total) by mouth daily. 09/11/23  Yes Charlette Console, FNP  clopidogrel  (PLAVIX ) 75 MG tablet Take 1 tablet (75 mg total) by mouth daily with breakfast. 10/17/23  10/11/24 Yes Hackney, Brian Campanile A, FNP  Finerenone  (KERENDIA ) 10 MG TABS Take 1 tablet (10 mg total) by mouth daily. 11/01/23  Yes Hackney, Brian Campanile A, FNP  furosemide  (LASIX ) 40 MG tablet Take 40 mg by mouth 2 (two) times daily. 10/09/23  Yes [provider]  gentamicin  cream (GARAMYCIN ) 0.1 % Apply 1 Application topically 2 (two) times daily. 05/31/23  Yes Dot Gazella, [name]  glipiZIDE  (GLUCOTROL ) 10 MG tablet Take 1 tablet (10 mg total) by mouth 2 (two) times daily. 08/18/23  Yes Garrison Kanner, MD  losartan  (COZAAR ) 25 MG tablet Take 25 mg by mouth daily.   Yes [provider]  metolazone  (ZAROXOLYN ) 2.5 MG tablet Take 1 tablet (2.5 mg total) by mouth 2 (two) times a week. On Mondays & Fridays 09/12/23  Yes Sheril Dines, MD  Multiple Vitamins-Minerals (MULTIVITAMIN WITH MINERALS) tablet Take 1 tablet by mouth daily.   Yes [provider]  silver  sulfADIAZINE  (SILVADENE ) 1 % cream Apply to affected area daily 06/25/23 06/24/24 Yes Dot Gazella, [name]  doxycycline  (VIBRA -TABS) 100 MG tablet Take 1 tablet (100 mg total) by mouth 2 (two) times daily. Patient not taking: Reported on 01/26/2024 12/31/23   Dot Gazella, [name]  metFORMIN  (GLUCOPHAGE ) 1000 MG tablet Take 1 tablet (1,000 mg total) by mouth 2 (two) times daily. Patient not taking: Reported on 01/26/2024 08/18/23   Garrison Kanner, MD   Past Medical History: Past Medical History:  Diagnosis Date   (HFpEF) heart failure with preserved ejection fraction (HCC) 03/01/2020   a.) TTE 03/01/2020: EF 55-60%, mod MAC, mod AoV sclerosis, triv AR, mild TR, mod Tyler, RVSP 50-59; b.) TTE 09/10/2020: EF 55-60%, mod MAC, mod AoV sclerosis, mild TR, 3+ Tyler, RVSP 50-59; c.) TTE 09/26/2020: EF 55-60%, mild LA dil, triv PR, mild Tyler/TR, RVSP 37-49   Adenoma of left adrenal gland    Anemia    Arthritis    Atrial fibrillation and flutter (HCC)    a.) CHA2DS2VASc = 5 (age x2, HFpEF, HTN, T2DM);  b.) s/p CTI ablation 09/07/2020; c.) rate/rhythm maintained on oral  carvedilol ; not on chronic anticoagulation therapy   CAD (coronary artery disease)    Cardiomegaly    CKD (chronic kidney disease), stage III (HCC)    DOE (dyspnea on exertion)    Drug-induced bradycardia    Gangrene of toe of left foot (HCC)    a.) 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    HLD (hyperlipidemia)    Hypertension    Long term current use of aspirin     Lymphedema of both lower extremities    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)    a.) s/p amputation 11/04/2022   Peripheral vascular disease (HCC)    Pleural effusion on right 09/09/2020   a.) s/p RIGHT thoracentesis with 2180 cc yield   Pneumonia    Presence of permanent cardiac pacemaker 09/10/2020   a.) TVP placement 09/10/2020 due to intermittent CHB in setting of urosepsis; b.) s/p PPM placement 09/15/2020: MDT Azure XT DR (SN: [id number])   Pulmonary hypertension (HCC) 03/01/2020   a.) TTE: 03/01/2020: RVSP 50-59; b.) TTE 09/10/2020: RVSP  50-59; c.) TTE 09/26/2020: RVSP 37-49   RA (rheumatoid arthritis) (HCC)    Rheumatic fever    Sepsis (HCC) 09/10/2020   a.) urosepsis --> BC x 2 sets and UC all grew out significant Proteus mirabilis; admitted to [facility] 09/07/2020 - 09/27/2020.   Sick sinus syndrome [facility])    a.) s/p MDT PPM placement 09/15/2020   T2DM (type 2 diabetes mellitus) (HCC)    Urinary retention    chronic, with indwelling Foley catheter and plans for a suprapubic   Wears dentures    full upper   Past Surgical History: Past Surgical History:  Procedure Laterality Date   AMPUTATION TOE Right 11/04/2022   Procedure: AMPUTATION TOE;  Surgeon: Dot Gazella, [name];  [name];  Service: Podiatry;  Laterality: Right;   AMPUTATION TOE Right 09/15/2023   Procedure: AMPUTATION TOE;  Surgeon: Jennefer Moats, [name];  [name];  Service: Orthopedics/Podiatry;  Laterality: Right;  Right hallux  amputation   BONE BIOPSY Right 04/05/2023   Procedure: BONE BIOPSY THIRD & FOURTH;  Surgeon: Dot Gazella, [name];  [name];  Service: Podiatry;  Laterality: Right;   CARDIAC ELECTROPHYSIOLOGY STUDY AND ABLATION N/A 09/07/2020   Procedure: CARDIAC EP STUDY AND ABLATION (CTI)   CATARACT EXTRACTION W/PHACO Right 01/16/2017   Procedure: CATARACT EXTRACTION PHACO AND INTRAOCULAR LENS PLACEMENT (IOC)  Right Complicated;  Surgeon: Annell Kidney, MD;  Location: [facility];  Service: Ophthalmology;  Laterality: Right;  IVA Block Healon 5 malyugin vision blue Diabetic - oral meds   CATARACT EXTRACTION W/PHACO Left 10/23/2022   Procedure: CATARACT EXTRACTION PHACO AND INTRAOCULAR LENS PLACEMENT (IOC) LEFT DIABETIC;  Surgeon: Clair Crews, MD;  Location: [facility];  Service: Ophthalmology;  Laterality: Left;  Diabetic   COLONOSCOPY     INCISION AND DRAINAGE OF WOUND Left 09/15/2023   Procedure: IRRIGATION AND DEBRIDEMENT WOUND;  Surgeon: Jennefer Moats, [name];  [name];  Service: Orthopedics/Podiatry;  Laterality: Left;  Left foot wound debridement, possible graft/biopsy   LOWER EXTREMITY ANGIOGRAPHY Right 11/02/2022   Procedure: Lower Extremity Angiography;  Surgeon: Celso College, MD;  Location: [facility];  Service: Cardiovascular;  Laterality: Right;   LOWER EXTREMITY ANGIOGRAPHY Right 09/13/2023   Procedure: Lower Extremity Angiography;  Surgeon: Jackquelyn Mass, MD;  Location: [facility];  Service: Cardiovascular;  Laterality: Right;   METATARSAL HEAD EXCISION Left 07/05/2018   Procedure: RESECTION FIRST METATARSAL INFECTED BONE AND SOFT TISSUE;  Surgeon: Sharlyn Deaner, [name];  [name];  Service: Podiatry;  Laterality: Left;   METATARSAL HEAD EXCISION Right 04/05/2023   Procedure: METATARSAL HEAD EXCISION THIRD & FOURTH;  Surgeon: Dot Gazella, [name];  [name];  Service: Podiatry;  Laterality: Right;    PACEMAKER INSERTION  09/15/2020   TOE AMPUTATION Left    TONSILLECTOMY     Family History: Family History  Problem Relation Age of Onset   Cancer Niece    Social History: Social History   Socioeconomic History   Marital status: Single    Spouse name: Not on file   Number of children: Not on file   Years of education: Not on file   Highest education level: Not on file  Occupational History    Comment: [employer]  Tobacco Use   Smoking status: Never   Smokeless tobacco: Never  Vaping Use   Vaping status: Never Used  Substance and Sexual Activity   Alcohol use: Not Currently  Comment: rarely   Drug use: Never   Sexual activity: Not Currently    Birth control/protection: None  Other Topics Concern   Not on file  Social History Narrative   Lives with roommate   Social Drivers of Health   Financial Resource Strain: Low Risk  (03/20/2023)   Received from [facility], [facility]   Overall Financial Resource Strain (CARDIA)    Difficulty of Paying Living Expenses: Not hard at all  Food Insecurity: No Food Insecurity (01/27/2024)   Hunger Vital Sign    Worried About Running Out of Food in the Last Year: Never true    Ran Out of Food in the Last Year: Never true  Transportation Needs: No Transportation Needs (01/27/2024)   PRAPARE - Administrator, Civil Service (Medical): No    Lack of Transportation (Non-Medical): No  Physical Activity: Not on file  Stress: No Stress Concern Present (09/07/2020)   Received from [facility], [facility] of Occupational Health - Occupational Stress Questionnaire    Feeling of Stress : Not at all  Social Connections: Socially Isolated (01/27/2024)   Social Connection and Isolation Panel [NHANES]    Frequency of Communication with Friends and Family: More than three times a week    Frequency of Social Gatherings with Friends and Family: Twice a week    Attends Religious Services: Never     Database administrator or Organizations: No    Attends Banker Meetings: Never    Marital Status: Divorced   Allergies:  Allergies  Allergen Reactions   Quinolones Other (See Comments)    Patient has AAA - fluoroquinolones are contraindicated due to risk of aortic rupture.   Eliquis  [Apixaban ]     Dizziness  and vision change   Spironolactone  Other (See Comments)    dizziness   Objective:    Vital Signs:   Temp:  [97.7 F (36.5 C)-102.9 F (39.4 C)] 98.6 F (37 C) (05/06 0816) Pulse Rate:  [50-115] 96 (05/06 0600) Resp:  [18-33] 20 (05/06 0600) BP: (85-130)/(56-110) 94/65 (05/06 0600) SpO2:  [97 %-100 %] 97 % (05/06 0600) Last BM Date : 01/26/24  Weight change: Filed Weights   01/26/24 1926  Weight: 118.3 kg   Intake/Output:   Intake/Output Summary (Last 24 hours) at 01/28/2024 0836 Last data filed at 01/28/2024 0251 Gross per 24 hour  Intake 738.05 ml  Output 2275 ml  Net -1536.95 ml     Physical Exam  General:  elderly appearing.   Neck: supple. JVD elevated.  Cor: PMI nondisplaced. Regular rate & irregular rhythm. No rubs, gallops or murmurs. Lungs: coarse Extremities: no cyanosis, clubbing, rash, +1-2 BL edema to thigh Neuro: alert & oriented x 3. Moves all 4 extremities w/o difficulty. Affect pleasant.   Telemetry   A fib 80s with intt PVCs 6-16/hr (Personally reviewed)    EKG    A fib PVCs LBBB 112 bpm   Labs   Basic Metabolic Panel: Recent Labs  Lab 01/26/24 1936 01/27/24 0151 01/27/24 2043 01/28/24 0401  NA 137 136 136 138  K 4.2 4.4 4.0 3.3*  CL 108 109 108 105  CO2 17* 16* 19* 18*  GLUCOSE 189* 223* 145* 120*  BUN 41* 45* 61* 61*  CREATININE 1.50* 1.56* 1.82* 2.05*  CALCIUM  9.3 8.6* 8.8* 8.3*    Liver Function Tests: Recent Labs  Lab 01/26/24 1936 01/27/24 2043  AST 24 28  ALT 24 22  ALKPHOS  78 58  BILITOT 0.8 1.2  PROT 7.1 6.9  ALBUMIN 3.8 3.2*   No results for input(s): "LIPASE", "AMYLASE" in the last 168  hours. No results for input(s): "AMMONIA" in the last 168 hours.  CBC: Recent Labs  Lab 01/26/24 1936 01/27/24 0151 01/28/24 0401  WBC 18.2* 21.9* 13.0*  NEUTROABS 16.2*  --   --   HGB 11.5* 11.0* 10.3*  HCT 36.3* 34.6* 32.6*  MCV 88.5 87.8 87.4  PLT 164 143* 117*   Cardiac Enzymes: No results for input(s): "CKTOTAL", "CKMB", "CKMBINDEX", "TROPONINI" in the last 168 hours.  BNP: BNP (last 3 results) Recent Labs    09/11/23 1627 10/28/23 1107 01/27/24 0844  BNP 3,874.2* 754.7* 4,043.5*   ProBNP (last 3 results) Recent Labs    10/02/23 1111  PROBNP 3,757*   CBG: Recent Labs  Lab 01/27/24 0744 01/27/24 1247 01/27/24 1638 01/27/24 02-07-06 01/28/24 0815  GLUCAP 282* 260* 165* 124* 142*   Coagulation Studies: Recent Labs    01/27/24 07-Feb-2042  LABPROT 18.2*  INR 1.5*   Imaging   CT Angio Chest Pulmonary Embolism (PE) W or WO Contrast Result Date: 01/27/2024 CLINICAL DATA:  Chest pain.  PE suspected EXAM: CT ANGIOGRAPHY CHEST WITH CONTRAST TECHNIQUE: Multidetector CT imaging of the chest was performed using the standard protocol during bolus administration of intravenous contrast. Multiplanar CT image reconstructions and MIPs were obtained to evaluate the vascular anatomy. RADIATION DOSE REDUCTION: This exam was performed according to the departmental dose-optimization program which includes automated exposure control, adjustment of the mA and/or kV according to patient size and/or use of iterative reconstruction technique. CONTRAST:  75mL OMNIPAQUE IOHEXOL 350 MG/ML SOLN COMPARISON:  Same day chest radiograph FINDINGS: Cardiovascular: Cardiomegaly. Mitral annular calcification. Coronary artery and aortic atherosclerotic calcification. Mild dilation of the ascending aorta measuring 40 mm in diameter. No pericardial effusion. Negative for acute pulmonary embolism. Left chest wall pacemaker. Mediastinum/Nodes: Trachea and esophagus are unremarkable. Shotty mediastinal lymph nodes  are likely reactive. Lungs/Pleura: Small right pleural effusion. Interlobular septal thickening and hazy ground-glass opacities with a lower lung and posterior predominance. No focal consolidation, pleural effusion, or pneumothorax. Upper Abdomen: No acute abnormality. Musculoskeletal: No acute fracture. Review of the MIP images confirms the above findings. IMPRESSION: 1. Negative for acute pulmonary embolism. 2. Findings suggestive of CHF with mild pulmonary edema. Small right pleural effusion. 3. 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 Recommend annual imaging followup by CTA or MRA. This recommendation follows Feb 07, 2009 ACCF/AHA/AATS/ACR/ASA/SCA/SCAI/SIR/STS/SVM Guidelines for the Diagnosis and Management of Patients with Thoracic Aortic Disease. Circulation. Feb 07, 2009; 121: B147-W295. Aortic aneurysm NOS (ICD10-I71.9) 4. Aortic Atherosclerosis (ICD10-I70.0). Electronically Signed   By: Rozell Cornet M.D.   On: 01/27/2024 22:11     Medications:     Current Medications:  aspirin  EC  81 mg Oral Daily   atorvastatin   80 mg Oral q1800   Chlorhexidine  Gluconate Cloth  6 each Topical Daily   clopidogrel   75 mg Oral Q breakfast   gentamicin  cream  1 Application Topical BID   insulin  aspart  0-15 Units Subcutaneous TID WC   insulin  aspart  0-5 Units Subcutaneous QHS   metolazone   2.5 mg Oral Once per day on Monday Friday   multivitamin with minerals  1 tablet Oral Daily   potassium chloride   20 mEq Oral Once   silver  sulfADIAZINE    Topical Daily    Infusions:  acetaminophen  1,000 mg (01/28/24 0646)   ceFEPime  (MAXIPIME ) IV 2 g (01/28/24 0836)   heparin  2,000  Units/hr (01/28/24 0720)   metronidazole  500 mg (01/27/24 2319)   vancomycin       Patient Profile   Tyler [name] is a 78 y/o male with a history of persistent atrial fibrillation 06/21 (not on anticoag), HTN, T2DM, CKD, cellulitis, hyperlipidemia, 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 Admitted with A/C HFrEF, sepsis and NSTEMI.   Assessment/Plan  Acute on chronic HFrEF - Echo 07/03/23: EF 30-35% with moderate Tyler - NYHA IV on admission. Volume overloaded on exam 2/2 hx medication noncompliance, PAF, NSTEMI.  - Plan to start diuresis, up ~20lbs from February. Yesterday diuresed ~2.2L with IV bumex  and metolazone .  - Hold SGLT2i with AKI - no MRA with hx of intolerance. Can consider eplerenone if he is open to trying once renal function improves - Hold BB with volume overload and acute decompensation - Avoid hypotension. No ARB/ARNi for now.  - Lactic acid cleared. 2.3>1.5>1.4 - May need inotropic support. Follow with diuresis.  - May need PICC placement, hesitant with current bacteremia.  - Will need L/RHC once renal function stabilizes.  - Place compression hose. Strict I&O, daily weights  NSTEMI - Denies new chest pain. Has persistent 5/10 chest pain, has had it for years. - HsTrop 69>105>326>714>1336>1830>1804 - Continue heparin  gtt, ASA, Plavix  - Would need LHC when SCr improves  Sepsis 2/2 strep agalactiae bacteremia - Continue Rocephin   - UA with UTI with chronic foley, suspect this needs to be exchanged - Will need TEE to r/o vegetation  - febrile to 102.9 last night. WBC improving 18.2>21.9>13  Persistent atrial fibrillation - Rate controlled. A fib in 80s-90s - Continue ASA and heparin  gtt - On Plavix . Allergy to Eliquis  with dizziness and vision changes  AV block s/p MDT PPM  - Has not been compliant with EP follow ups.  - Will try to interrogate device   AKI on CKD IIIa - Baseline SCr ~1.2-1.5 - SCr up to 2 today - Avoid hypotension  Length of Stay: 0  Sheryl Donna, NP  01/28/2024, 8:36 AM   Advanced Heart Failure Team Pager [phone] (M-F; 7a - 5p)  Please contact [facility] for night-coverage after hours (4p  -7a ) and weekends on [email or web address]

## 2024-01-28 NOTE — Assessment & Plan Note (Signed)
 Concern of NSTEMI, type I versus type II Increasing troponin, troponin peaked at 1830 .  Continue to have some chest discomfort. Cardiology is on board Echocardiogram pending -Continue with heparin  infusion - Likely will get ischemic workup after diuresing - Continue with aspirin , Plavix  and atorvastatin 

## 2024-01-28 NOTE — Progress Notes (Signed)
 Heart Failure Navigator Progress Note  Assessed for Heart & Vascular TOC clinic readiness. Patient does not meet criteria due to current Advanced Heart Failure Team patient. Hospital follow-up had already been rescheduled from yesterday 01/28/2024 since he was hospitalized and missed his appointment with Dr. Bruce Caper.  Navigator available for reassessment of patient and will continue to follow for timing of follow-up appointment if it needs to be adjusted.  Celedonio Coil, RN, BSN [facility] Heart Failure Navigator Secure Chat Only

## 2024-01-28 NOTE — Assessment & Plan Note (Addendum)
 On admission through 01-28-2024. Worsening creatinine, AKI on chronic CKD stage IIIa. Worsening non-anion gap metabolic acidosis, multifactorial with AKI, CHF and also concern of sepsis. IV Bumex  was held by cardiology today -Giving gentle IV fluid with bicarb infusion -Monitor renal function -Avoid nephrotoxins  01-29-2024 worsening Scr today. Scr up to 2.33 today. Given his low EF I will avoid IVF for now. Cards gave him 160 mg of IV lasix  today.  Defer to cardiology to manage his AKI/CKD if they are continuing his diuresis.  01-30-2024 Scr 2.44 today.  Received 160 mg IV lasix . 2.3 liters in urine out yesterday. Pt states breathing is better. Defer to CHF team for further diuresis.  01-31-2024 scr 2.13, BUN 98 today.  has 4.7 liters in urine yesterday. Diuresis management by cardiology team.  Appears further IV diuretics on hold by cardiology team.  02-01-2024 3.5 liters in urine output. Did not receive any diuretics yesterday. Monitor renal function.  02-02-2024 Scr 1.41, BUN 74 today.  Urine output dropped off yesterday. Only 1.2 liters in urine today. Appears from cardiology note today that bumex  is going to be started. Monitor renal function.  02-02-2024 Scr 1.46, BUN 67 today. Pt started yesterday on 2 mg bumex  daily. 2.3 liter in urine output yesterday.  02-04-2024 Scr 1.34, BUN 63 today.  On bumex  2 mg daily per cardiology. 2 liters in urine output yesterday.

## 2024-01-28 NOTE — Progress Notes (Signed)
 PHARMACY - PHYSICIAN COMMUNICATION CRITICAL VALUE ALERT - BLOOD CULTURE IDENTIFICATION (BCID)  Tyler [name] is an 77 y.o. male who presented to [facility] on 01/26/2024 with a chief complaint of chest pain and shortness of breath. On night of 5/5 rapid response called for fever and increased lethargy.    Assessment:  blood culture from 5/5 with GPC in both sets,  BCID detects group B streptococcus.  Source LE cellulitis.   Name of physician (or Provider) Contacted: Dr Ariel Begun  Current antibiotics: Vancomycin /cefepime /metronidazole   Changes to prescribed antibiotics recommended:  Recommendations accepted by provider - due to acute HF with avoid PCN due to fluid and start ceftriaxone  2gm IV q24h.    Results for orders placed or performed during the hospital encounter of 01/26/24  Blood Culture ID Panel (Reflexed) (Collected: 01/27/2024  8:49 PM)  Result Value Ref Range   Enterococcus faecalis NOT DETECTED NOT DETECTED   Enterococcus Faecium NOT DETECTED NOT DETECTED   Listeria monocytogenes NOT DETECTED NOT DETECTED   Staphylococcus species NOT DETECTED NOT DETECTED   Staphylococcus aureus (BCID) NOT DETECTED NOT DETECTED   Staphylococcus epidermidis NOT DETECTED NOT DETECTED   Staphylococcus lugdunensis NOT DETECTED NOT DETECTED   Streptococcus species DETECTED (A) NOT DETECTED   Streptococcus agalactiae DETECTED (A) NOT DETECTED   Streptococcus pneumoniae NOT DETECTED NOT DETECTED   Streptococcus pyogenes NOT DETECTED NOT DETECTED   A.calcoaceticus-baumannii NOT DETECTED NOT DETECTED   Bacteroides fragilis NOT DETECTED NOT DETECTED   Enterobacterales NOT DETECTED NOT DETECTED   Enterobacter cloacae complex NOT DETECTED NOT DETECTED   Escherichia coli NOT DETECTED NOT DETECTED   Klebsiella aerogenes NOT DETECTED NOT DETECTED   Klebsiella oxytoca NOT DETECTED NOT DETECTED   Klebsiella pneumoniae NOT DETECTED NOT DETECTED   Proteus species NOT DETECTED NOT DETECTED   Salmonella species  NOT DETECTED NOT DETECTED   Serratia marcescens NOT DETECTED NOT DETECTED   Haemophilus influenzae NOT DETECTED NOT DETECTED   Neisseria meningitidis NOT DETECTED NOT DETECTED   Pseudomonas aeruginosa NOT DETECTED NOT DETECTED   Stenotrophomonas maltophilia NOT DETECTED NOT DETECTED   Candida albicans NOT DETECTED NOT DETECTED   Candida auris NOT DETECTED NOT DETECTED   Candida glabrata NOT DETECTED NOT DETECTED   Candida krusei NOT DETECTED NOT DETECTED   Candida parapsilosis NOT DETECTED NOT DETECTED   Candida tropicalis NOT DETECTED NOT DETECTED   Cryptococcus neoformans/gattii NOT DETECTED NOT DETECTED    Valma Gazella, PharmD, BCPS, BCIDP Work Cell: [phone] 01/28/2024 8:41 AM

## 2024-01-28 NOTE — Progress Notes (Signed)
 Echocardiogram 2D Echocardiogram has been performed.  Tyler [name] 01/28/2024, 6:08 PM

## 2024-01-28 NOTE — Assessment & Plan Note (Addendum)
 On admission through 01-28-2024 Patient met sepsis criteria after developing high-grade fever with tachycardia overnight.  Likely due to lower extremity cellulitis and bilateral multiple foot wounds. Received broad-spectrum antibiotics overnight Preliminary blood cultures with group B strep agalactia - De-escalate antibiotics to ceftriaxone  - ID was consulted - Echocardiogram pending  01-29-2024 ID consulting. Concern for potential endocarditis +/- PPM infection.   02-01-2024 pt awaiting TEE. Maybe next week. Remains on IV rocephin  2 grams daily. 01-31-2024 TEE canceled today by cards due to altered mental status. They will re-evaluate next week. Cards has stated pt is not a candidate for device extraction. Continue with IV ABX under the direction of ID service. Currently on IV rocephin  2 gram daily.  01-30-2024 potential for TEE tomorrow. Decision will be made tomorrow AM. NPO after MN in case TEE can be performed tomorrow. Remains on IV ABX.   02-02-2024 yesterday pt's wound cultures from foot growing enterococcus and MRSA. IV ABX changed to IV vanco which will cover group B strep in blood and MRSA/enterococcus from foot wound  02-03-2024 Cards unable to get pt onto TEE schedule for today but will attempt to get TEE tomorrow.  On IV vanco.  02-04-2024 TEE negative for vegetations.  ID changed him back over to IV Rocephin .

## 2024-01-28 NOTE — Progress Notes (Signed)
 [facility] CLINIC CARDIOLOGY PROGRESS NOTE       Patient ID: Tyler [name] MRN: [id number] DOB/AGE: Oct 07, 1945 78 y.o.  Admit date: 01/26/2024 Referring Physician Dr. Amalia Jung Primary Physician Jewell Mose, NP  Primary Cardiologist Dr. Bob Burn Reason for Consultation chest pain  HPI: Tyler [name] is a 78 y.o. male  with a past medical history of chronic HFrEF, persistent atrial fibrillation, mitral insufficiency, CHB s/p PPM 2021, hypertension, chronic kidney disease stage III who presented to the ED on 01/26/2024 for chest pain. Cardiology was consulted for further evaluation.   Interval history: -Patient seen and examined this morning, resting comfortably in hospital bed. -States that he feels overall better now than he did overnight.  Rapid response was called overnight due to increased work of breathing, tachycardia, fever to 102.9.  Started on IV antibiotics. -Creatinine up to 2.05 this a.m.  Has had good urine output.  Review of systems complete and found to be negative unless listed above    Past Medical History:  Diagnosis Date   (HFpEF) heart failure with preserved ejection fraction (HCC) 03/01/2020   a.) TTE 03/01/2020: EF 55-60%, mod MAC, mod AoV sclerosis, triv AR, mild TR, mod MR, RVSP 50-59; b.) TTE 09/10/2020: EF 55-60%, mod MAC, mod AoV sclerosis, mild TR, 3+ MR, RVSP 50-59; c.) TTE 09/26/2020: EF 55-60%, mild LA dil, triv PR, mild MR/TR, RVSP 37-49   Adenoma of left adrenal gland    Anemia    Arthritis    Atrial fibrillation and flutter (HCC)    a.) CHA2DS2VASc = 5 (age x2, HFpEF, HTN, T2DM);  b.) s/p CTI ablation 09/07/2020; c.) rate/rhythm maintained on oral carvedilol ; not on chronic anticoagulation therapy   CAD (coronary artery disease)    Cardiomegaly    CKD (chronic kidney disease), stage III (HCC)    DOE (dyspnea on exertion)    Drug-induced bradycardia    Gangrene of toe of left foot (HCC)    a.) 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    Hepatosplenomegaly    History of bilateral cataract extraction    HLD (hyperlipidemia)    Hypertension    Long term current use of aspirin     Lymphedema of both lower extremities    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)    a.) s/p amputation 11/04/2022   Peripheral vascular disease (HCC)    Pleural effusion on right 09/09/2020   a.) s/p RIGHT thoracentesis with 2180 cc yield   Pneumonia    Presence of permanent cardiac pacemaker 09/10/2020   a.) TVP placement 09/10/2020 due to intermittent CHB in setting of urosepsis; b.) s/p PPM placement 09/15/2020: MDT Azure XT DR (SN: [id number])   Pulmonary hypertension (HCC) 03/01/2020   a.) TTE: 03/01/2020: RVSP 50-59; b.) TTE 09/10/2020: RVSP 50-59; c.) TTE 09/26/2020: RVSP 37-49   RA (rheumatoid arthritis) (HCC)    Rheumatic fever    Sepsis (HCC) 09/10/2020   a.) urosepsis --> BC x 2 sets and UC all grew out significant Proteus mirabilis; admitted to [facility] 09/07/2020 - 09/27/2020.   Sick sinus syndrome [facility])    a.) s/p MDT PPM placement 09/15/2020   T2DM (type 2 diabetes mellitus) (HCC)    Urinary retention    chronic, with indwelling Foley catheter and plans for a suprapubic   Wears dentures    full upper    Past Surgical History:  Procedure Laterality Date   AMPUTATION TOE Right 11/04/2022   Procedure: AMPUTATION TOE;  Surgeon: Wilnette Haste  M, [name];  [name];  Service: Podiatry;  Laterality: Right;   AMPUTATION TOE Right 09/15/2023   Procedure: AMPUTATION TOE;  Surgeon: Jennefer Moats, [name];  [name];  Service: Orthopedics/Podiatry;  Laterality: Right;  Right hallux amputation   BONE BIOPSY Right 04/05/2023   Procedure: BONE BIOPSY THIRD & FOURTH;  Surgeon: Dot Gazella, [name];  [name];  Service: Podiatry;  Laterality: Right;   CARDIAC ELECTROPHYSIOLOGY STUDY AND ABLATION N/A 09/07/2020   Procedure: CARDIAC EP STUDY AND ABLATION (CTI)   CATARACT EXTRACTION W/PHACO Right  01/16/2017   Procedure: CATARACT EXTRACTION PHACO AND INTRAOCULAR LENS PLACEMENT (IOC)  Right Complicated;  Surgeon: Annell Kidney, MD;  Location: [facility];  Service: Ophthalmology;  Laterality: Right;  IVA Block Healon 5 malyugin vision blue Diabetic - oral meds   CATARACT EXTRACTION W/PHACO Left 10/23/2022   Procedure: CATARACT EXTRACTION PHACO AND INTRAOCULAR LENS PLACEMENT (IOC) LEFT DIABETIC;  Surgeon: Clair Crews, MD;  Location: [facility];  Service: Ophthalmology;  Laterality: Left;  Diabetic   COLONOSCOPY     INCISION AND DRAINAGE OF WOUND Left 09/15/2023   Procedure: IRRIGATION AND DEBRIDEMENT WOUND;  Surgeon: Jennefer Moats, [name];  [name];  Service: Orthopedics/Podiatry;  Laterality: Left;  Left foot wound debridement, possible graft/biopsy   LOWER EXTREMITY ANGIOGRAPHY Right 11/02/2022   Procedure: Lower Extremity Angiography;  Surgeon: Celso College, MD;  Location: [facility];  Service: Cardiovascular;  Laterality: Right;   LOWER EXTREMITY ANGIOGRAPHY Right 09/13/2023   Procedure: Lower Extremity Angiography;  Surgeon: Jackquelyn Mass, MD;  Location: [facility];  Service: Cardiovascular;  Laterality: Right;   METATARSAL HEAD EXCISION Left 07/05/2018   Procedure: RESECTION FIRST METATARSAL INFECTED BONE AND SOFT TISSUE;  Surgeon: Sharlyn Deaner, [name];  [name];  Service: Podiatry;  Laterality: Left;   METATARSAL HEAD EXCISION Right 04/05/2023   Procedure: METATARSAL HEAD EXCISION THIRD & FOURTH;  Surgeon: Dot Gazella, [name];  [name];  Service: Podiatry;  Laterality: Right;   PACEMAKER INSERTION  09/15/2020   TOE AMPUTATION Left    TONSILLECTOMY      Medications Prior to Admission  Medication Sig Dispense Refill Last Dose/Taking   acetaminophen  (TYLENOL ) 500 MG tablet Take 2 tablets (1,000 mg total) by mouth 3 (three) times daily as needed (for pain).   Taking As Needed   aspirin  EC 81 MG  tablet Take 1 tablet (81 mg total) by mouth daily. Swallow whole. 30 tablet 0 01/25/2024   atorvastatin  (LIPITOR) 10 MG tablet Take 1 tablet (10 mg total) by mouth daily at 6 PM. 30 tablet 11 Taking   bumetanide  (BUMEX ) 2 MG tablet Take 1 tablet (2 mg total) by mouth daily. 90 tablet 3 Past Week   clopidogrel  (PLAVIX ) 75 MG tablet Take 1 tablet (75 mg total) by mouth daily with breakfast. 30 tablet 11 01/25/2024 Morning   Finerenone  (KERENDIA ) 10 MG TABS Take 1 tablet (10 mg total) by mouth daily. 30 tablet 5 Past Week   furosemide  (LASIX ) 40 MG tablet Take 40 mg by mouth 2 (two) times daily.   01/25/2024   gentamicin  cream (GARAMYCIN ) 0.1 % Apply 1 Application topically 2 (two) times daily. 30 g 1 Taking   glipiZIDE  (GLUCOTROL ) 10 MG tablet Take 1 tablet (10 mg total) by mouth 2 (two) times daily. 60 tablet 2 Past Month   losartan  (COZAAR ) 25 MG tablet Take 25 mg by mouth daily.   Taking   metolazone  (ZAROXOLYN )  2.5 MG tablet Take 1 tablet (2.5 mg total) by mouth 2 (two) times a week. On Mondays & Fridays   Past Month   Multiple Vitamins-Minerals (MULTIVITAMIN WITH MINERALS) tablet Take 1 tablet by mouth daily.   01/25/2024   silver  sulfADIAZINE  (SILVADENE ) 1 % cream Apply to affected area daily 50 g 1 Taking   doxycycline  (VIBRA -TABS) 100 MG tablet Take 1 tablet (100 mg total) by mouth 2 (two) times daily. (Patient not taking: Reported on 01/26/2024) 20 tablet 0 Not Taking   metFORMIN  (GLUCOPHAGE ) 1000 MG tablet Take 1 tablet (1,000 mg total) by mouth 2 (two) times daily. (Patient not taking: Reported on 01/26/2024) 60 tablet 2 Not Taking   Social History   Socioeconomic History   Marital status: Single    Spouse name: Not on file   Number of children: Not on file   Years of education: Not on file   Highest education level: Not on file  Occupational History    Comment: [employer]  Tobacco Use   Smoking status: Never   Smokeless tobacco: Never  Vaping Use   Vaping status: Never Used   Substance and Sexual Activity   Alcohol use: Not Currently    Comment: rarely   Drug use: Never   Sexual activity: Not Currently    Birth control/protection: None  Other Topics Concern   Not on file  Social History Narrative   Lives with roommate   Social Drivers of Health   Financial Resource Strain: Low Risk  (03/20/2023)   [facility]   Overall Financial Resource Strain (CARDIA)    Difficulty of Paying Living Expenses: Not hard at all  Food Insecurity: No Food Insecurity (01/27/2024)   Hunger Vital Sign    Worried About Running Out of Food in the Last Year: Never true    Ran Out of Food in the Last Year: Never true  Transportation Needs: No Transportation Needs (01/27/2024)   PRAPARE - Administrator, Civil Service (Medical): No    Lack of Transportation (Non-Medical): No  Physical Activity: Not on file  Stress: No Stress Concern Present (09/07/2020)   Received from [facility] Occupational Health - Occupational Stress Questionnaire    Feeling of Stress : Not at all  Social Connections: Socially Isolated (01/27/2024)   Social Connection and Isolation Panel [NHANES]    Frequency of Communication with Friends and Family: More than three times a week    Frequency of Social Gatherings with Friends and Family: Twice a week    Attends Religious Services: Never    Database administrator or Organizations: No    Attends Banker Meetings: Never    Marital Status: Divorced  Catering manager Violence: Not At Risk (01/27/2024)   Humiliation, Afraid, Rape, and Kick questionnaire    Fear of Current or Ex-Partner: No    Emotionally Abused: No    Physically Abused: No    Sexually Abused: No    Family History  Problem Relation Age of Onset   Cancer Niece      Vitals:   01/28/24 0400 01/28/24 0500 01/28/24 0600 01/28/24 0816  BP: 104/67 112/67 94/65   Pulse: 99 90 96   Resp: 20 19 20    Temp:   98.5 F (36.9 C)  98.6 F (37 C)  TempSrc:  Axillary    SpO2: 98% 99% 97%   Weight:      Height:  PHYSICAL EXAM General: chronically ill appearing elderly male, well nourished, in no acute distress. HEENT: Normocephalic and atraumatic. Neck: No JVD.  Lungs: Normal respiratory effort on 2L. Bibasilar crackles. Heart: HRRR. Normal S1 and S2 without gallops or murmurs.  Abdomen: Non-distended appearing.  Msk: Normal strength and tone for age. Extremities: Warm and well perfused. No clubbing, cyanosis. 2+ pitting edema bilaterally.  Neuro: Alert and oriented X 3. Psych: Answers questions appropriately.   Labs: Basic Metabolic Panel: Recent Labs    01/27/24 2043 01/28/24 0401  NA 136 138  K 4.0 3.3*  CL 108 105  CO2 19* 18*  GLUCOSE 145* 120*  BUN 61* 61*  CREATININE 1.82* 2.05*  CALCIUM  8.8* 8.3*   Liver Function Tests: Recent Labs    01/26/24 1936 01/27/24 2043  AST 24 28  ALT 24 22  ALKPHOS 78 58  BILITOT 0.8 1.2  PROT 7.1 6.9  ALBUMIN 3.8 3.2*   No results for input(s): "LIPASE", "AMYLASE" in the last 72 hours. CBC: Recent Labs    01/26/24 1936 01/27/24 0151 01/28/24 0401  WBC 18.2* 21.9* 13.0*  NEUTROABS 16.2*  --   --   HGB 11.5* 11.0* 10.3*  HCT 36.3* 34.6* 32.6*  MCV 88.5 87.8 87.4  PLT 164 143* 117*   Cardiac Enzymes: Recent Labs    01/27/24 2043 01/28/24 0011 01/28/24 0401  TROPONINIHS 1,336* 1,830* 1,804*   BNP: Recent Labs    01/27/24 0844  BNP 4,043.5*   D-Dimer: No results for input(s): "DDIMER" in the last 72 hours. Hemoglobin A1C: Recent Labs    01/27/24 0153  HGBA1C 6.9*   Fasting Lipid Panel: No results for input(s): "CHOL", "HDL", "LDLCALC", "TRIG", "CHOLHDL", "LDLDIRECT" in the last 72 hours. Thyroid Function Tests: No results for input(s): "TSH", "T4TOTAL", "T3FREE", "THYROIDAB" in the last 72 hours.  Invalid input(s): "FREET3" Anemia Panel: No results for input(s): "VITAMINB12", "FOLATE", "FERRITIN",  "TIBC", "IRON", "RETICCTPCT" in the last 72 hours.   Radiology: CT Angio Chest Pulmonary Embolism (PE) W or WO Contrast Result Date: 01/27/2024 CLINICAL DATA:  Chest pain.  PE suspected EXAM: CT ANGIOGRAPHY CHEST WITH CONTRAST TECHNIQUE: Multidetector CT imaging of the chest was performed using the standard protocol during bolus administration of intravenous contrast. Multiplanar CT image reconstructions and MIPs were obtained to evaluate the vascular anatomy. RADIATION DOSE REDUCTION: This exam was performed according to the departmental dose-optimization program which includes automated exposure control, adjustment of the mA and/or kV according to patient size and/or use of iterative reconstruction technique. CONTRAST:  75mL OMNIPAQUE IOHEXOL 350 MG/ML SOLN COMPARISON:  Same day chest radiograph FINDINGS: Cardiovascular: Cardiomegaly. Mitral annular calcification. Coronary artery and aortic atherosclerotic calcification. Mild dilation of the ascending aorta measuring 40 mm in diameter. No pericardial effusion. Negative for acute pulmonary embolism. Left chest wall pacemaker. Mediastinum/Nodes: Trachea and esophagus are unremarkable. Shotty mediastinal lymph nodes are likely reactive. Lungs/Pleura: Small right pleural effusion. Interlobular septal thickening and hazy ground-glass opacities with a lower lung and posterior predominance. No focal consolidation, pleural effusion, or pneumothorax. Upper Abdomen: No acute abnormality. Musculoskeletal: No acute fracture. Review of the MIP images confirms the above findings. IMPRESSION: 1. Negative for acute pulmonary embolism. 2. Findings suggestive of CHF with mild pulmonary edema. Small right pleural effusion. 3. 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 Recommend annual imaging followup by CTA or MRA. This recommendation follows 2010 ACCF/AHA/AATS/ACR/ASA/SCA/SCAI/SIR/STS/SVM Guidelines for the Diagnosis and Management of Patients with Thoracic Aortic Disease.  Circulation. 2010; 121: Z610-R604. Aortic aneurysm NOS (ICD10-I71.9) 4. Aortic  Atherosclerosis (ICD10-I70.0). Electronically Signed   By: Rozell Cornet M.D.   On: 01/27/2024 22:11   CT Head Wo Contrast Result Date: 01/26/2024 CLINICAL DATA:  Mental status change. EXAM: CT HEAD WITHOUT CONTRAST TECHNIQUE: Contiguous axial images were obtained from the base of the skull through the vertex without intravenous contrast. RADIATION DOSE REDUCTION: This exam was performed according to the departmental dose-optimization program which includes automated exposure control, adjustment of the mA and/or kV according to patient size and/or use of iterative reconstruction technique. COMPARISON:  Head CT 07/04/2018. FINDINGS: Brain: No evidence of acute infarction, hemorrhage, hydrocephalus, extra-axial collection or mass lesion/mass effect. There is mild diffuse atrophy and mild periventricular white matter hypodensity which is similar to the prior study. Vascular: Atherosclerotic calcifications are present within the cavernous internal carotid arteries. Skull: Normal. Negative for fracture or focal lesion. Sinuses/Orbits: No acute finding. Other: None. IMPRESSION: 1. No acute intracranial process. 2. Mild diffuse atrophy and mild chronic small vessel ischemic changes. Electronically Signed   By: Tyron Gallon M.D.   On: 01/26/2024 20:18   DG Chest Portable 1 View Result Date: 01/26/2024 CLINICAL DATA:  Chest pain EXAM: PORTABLE CHEST 1 VIEW COMPARISON:  09/11/2023 FINDINGS: Left-sided pacing device similar in position. Cardiomegaly with mild central congestion. No consolidation, pleural effusion or pneumothorax. IMPRESSION: Cardiomegaly with mild central congestion. Electronically Signed   By: Esmeralda Hedge M.D.   On: 01/26/2024 20:14    ECHO ordered  TELEMETRY reviewed by me 01/28/2024: Paced rhythm with PVCs  EKG reviewed by me: atrial fibrillation, rate 112 bpm PVCs.    Data reviewed by me 01/28/2024: last 24h vitals  tele labs imaging I/O's progress note, rapid response notes  Principal Problem:   Chest pain Active Problems:   Acute on chronic diastolic CHF (congestive heart failure) (HCC)   Type II diabetes mellitus with renal manifestations (HCC)   Dyslipidemia   Essential hypertension   NSTEMI (non-ST elevated myocardial infarction) (HCC)   Acute kidney injury superimposed on chronic kidney disease (HCC)    ASSESSMENT AND PLAN:  Tyler [name] is a 78 y.o. male  with a past medical history of chronic HFrEF, persistent atrial fibrillation, mitral insufficiency, CHB s/p PPM 2021, hypertension, chronic kidney disease stage III who presented to the ED on 01/26/2024 for chest pain. Cardiology was consulted for further evaluation.   # NSTEMI, type I vs type II # Acute on chronic HFrEF # Severe sepsis Patient reports to ED due to worsening SOB. Patient was seen outpatient cardiology on 09/2023 and there was plan for ischemic eval with PE stress test, however patient did not perform this exam or follow-up since this visit. Patient appears fluid overload on exam. Troponins elevated and trending 69 > 105 > 326 > 714 > 1300 > 1800. EKG in ED with AF PVCs with no acute ischemic changes, rate 112 bpm. Cr is elevated at 1.56 (baseline around 1.35). BP is stable. BNP elevated at 4043.  Rapid response called overnight for acute decompensated respiratory status, tachycardia, fever.  Meets sepsis criteria. -Advanced heart failure consulted.  Appreciate recommendations. -Echo ordered.  -Continue heparin  gtt.  -Continue ASA 81 mg, clopidogrel  75 mg, atorvastatin  80 mg daily. -Continue finerenone  10 mg daily. -Holding IV Bumex  at this time given elevated creatinine. Closely monitor renal function and UOP. -Defer starting home losartan  as Cr is elevated from baseline. Consider resuming when renal function is stable.   -Will resume low-dose of metoprolol  succinate 12.5 mg daily given frequent ectopy noted on  telemetry. -After diuresis and patient more stable from HF perspective will decide on ischemic evaluation.   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 Underwent PPM implant in 2021 for CHB, has had regular device checks since implant. EKG in ED and per tele this AM AF rates in 100s.  -Beta-blocker as above. Will resume for rate control when patient is more stable from HF perspective. -Recommend anticoagulation however patient has historically deferred given prior bleeding issues.  # Chronic kidney disease stage IIIa Patient with hx of CKD, baseline Cr 1.3-1.4. Cr this AM 2.05. -Continue to monitor renal function closely during diuresis. -Management per primary.    This patient's plan of care was discussed and created with Dr. Bob Burn and he is in agreement.  Signed: Hamp Levine, PA-C  01/28/2024, 8:56 AM [facility] Cardiology

## 2024-01-28 NOTE — Progress Notes (Signed)
 ANTICOAGULATION CONSULT NOTE  Pharmacy Consult for heparin  infusion Indication: ACS/STEMI  Allergies  Allergen Reactions   Eliquis  [Apixaban ]     Dizziness  and vision change   Spironolactone  Other (See Comments)    dizziness    Patient Measurements: Height: 6\' 6"  (198.1 cm) Weight: 118.3 kg (260 lb 12.8 oz) IBW/kg (Calculated) : 91.4 Heparin  Dosing Weight: 115.5 kg  Vital Signs: Temp: 98 F (36.7 C) (05/06 0200) Temp Source: Axillary (05/06 0200) BP: 104/67 (05/06 0400) Pulse Rate: 99 (05/06 0400)  Labs: Recent Labs    01/26/24 1936 01/26/24 1938 01/26/24 2133 01/27/24 0151 01/27/24 0844 01/27/24 1826 01/27/24 2043 01/28/24 0011 01/28/24 0401  HGB 11.5*  --   --  11.0*  --   --   --   --  10.3*  HCT 36.3*  --   --  34.6*  --   --   --   --  32.6*  PLT 164  --   --  143*  --   --   --   --  117*  APTT  --  31  --   --   --   --  72*  --   --   LABPROT  --   --   --   --   --   --  18.2*  --   --   INR  --   --   --   --   --   --  1.5*  --   --   HEPARINUNFRC  --   --   --   --  0.27* 0.39  --   --  0.20*  CREATININE 1.50*  --   --  1.56*  --   --  1.82*  --   --   TROPONINIHS 69*  --    < > 326* 714*  --  1,336* 1,830*  --    < > = values in this interval not displayed.    Estimated Creatinine Clearance: 47.6 mL/min (A) (by C-G formula based on SCr of 1.82 mg/dL (H)).   Medical History: Past Medical History:  Diagnosis Date   (HFpEF) heart failure with preserved ejection fraction (HCC) 03/01/2020   a.) TTE 03/01/2020: EF 55-60%, mod MAC, mod AoV sclerosis, triv AR, mild TR, mod MR, RVSP 50-59; b.) TTE 09/10/2020: EF 55-60%, mod MAC, mod AoV sclerosis, mild TR, 3+ MR, RVSP 50-59; c.) TTE 09/26/2020: EF 55-60%, mild LA dil, triv PR, mild MR/TR, RVSP 37-49   Adenoma of left adrenal gland    Anemia    Arthritis    Atrial fibrillation and flutter (HCC)    a.) CHA2DS2VASc = 5 (age x2, HFpEF, HTN, T2DM);  b.) s/p CTI ablation 09/07/2020; c.) rate/rhythm  maintained on oral carvedilol ; not on chronic anticoagulation therapy   CAD (coronary artery disease)    Cardiomegaly    CKD (chronic kidney disease), stage III (HCC)    DOE (dyspnea on exertion)    Drug-induced bradycardia    Gangrene of toe of left foot (HCC)    a.) 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    HLD (hyperlipidemia)    Hypertension    Long term current use of aspirin     Lymphedema of both lower extremities    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)    a.) s/p amputation 11/04/2022   Peripheral vascular disease (HCC)    Pleural  effusion on right 09/09/2020   a.) s/p RIGHT thoracentesis with 2180 cc yield   Pneumonia    Presence of permanent cardiac pacemaker 09/10/2020   a.) TVP placement 09/10/2020 due to intermittent CHB in setting of urosepsis; b.) s/p PPM placement 09/15/2020: MDT Azure XT DR (SN: [id number])   Pulmonary hypertension (HCC) 03/01/2020   a.) TTE: 03/01/2020: RVSP 50-59; b.) TTE 09/10/2020: RVSP 50-59; c.) TTE 09/26/2020: RVSP 37-49   RA (rheumatoid arthritis) (HCC)    Rheumatic fever    Sepsis (HCC) 09/10/2020   a.) urosepsis --> BC x 2 sets and UC all grew out significant Proteus mirabilis; admitted to [facility] 09/07/2020 - 09/27/2020.   Sick sinus syndrome (HCC)    a.) s/p MDT PPM placement 09/15/2020   T2DM (type 2 diabetes mellitus) (HCC)    Urinary retention    chronic, with indwelling Foley catheter and plans for a suprapubic   Wears dentures    full upper    Assessment: Pt is a 78 yo male presenting to ED complaining of feeling of dizziness and confusion that started around 4:00 PM today when he got home from work, found with elevated troponin I level trending up.  5/5 08:44  HL 0.27  SUBtherapeutic 5/5 1826 HL 0.39    Therapeutic x 1 5/6 0401 HL 0.20    SUBtherapeutic   Goal of Therapy:  Heparin  level 0.3-0.7 units/ml Monitor platelets by  anticoagulation protocol: Yes   Plan:  5/6:  HL @ 0.20,  SUBtherapeutic  - Will order heparin  1700 units IV X 1 bolus and increase drip rate to 2000 units/hr - Will recheck HL 8 hrs after rate change CBC daily while on heparin   Chrissa Meetze D Clinical Pharmacist 01/28/2024

## 2024-01-29 ENCOUNTER — Other Ambulatory Visit: Payer: Self-pay

## 2024-01-29 ENCOUNTER — Encounter: Payer: Self-pay | Admitting: Family Medicine

## 2024-01-29 DIAGNOSIS — L8990 Pressure ulcer of unspecified site, unspecified stage: Secondary | ICD-10-CM

## 2024-01-29 DIAGNOSIS — A401 Sepsis due to streptococcus, group B: Secondary | ICD-10-CM

## 2024-01-29 DIAGNOSIS — N179 Acute kidney failure, unspecified: Secondary | ICD-10-CM | POA: Diagnosis not present

## 2024-01-29 DIAGNOSIS — I214 Non-ST elevation (NSTEMI) myocardial infarction: Secondary | ICD-10-CM | POA: Diagnosis not present

## 2024-01-29 DIAGNOSIS — E1122 Type 2 diabetes mellitus with diabetic chronic kidney disease: Secondary | ICD-10-CM | POA: Diagnosis not present

## 2024-01-29 LAB — BASIC METABOLIC PANEL WITH GFR
Anion gap: 13 (ref 5–15)
BUN: 86 mg/dL — ABNORMAL HIGH (ref 8–23)
CO2: 20 mmol/L — ABNORMAL LOW (ref 22–32)
Calcium: 8.3 mg/dL — ABNORMAL LOW (ref 8.9–10.3)
Chloride: 103 mmol/L (ref 98–111)
Creatinine, Ser: 2.33 mg/dL — ABNORMAL HIGH (ref 0.61–1.24)
GFR, Estimated: 28 mL/min — ABNORMAL LOW (ref >60)
Glucose, Bld: 160 mg/dL — ABNORMAL HIGH (ref 70–99)
Potassium: 3.9 mmol/L (ref 3.5–5.1)
Sodium: 136 mmol/L (ref 135–145)

## 2024-01-29 LAB — CBC
HCT: 32.4 % — ABNORMAL LOW (ref 39.0–52.0)
Hemoglobin: 10.4 g/dL — ABNORMAL LOW (ref 13.0–17.0)
MCH: 27.6 pg (ref 26.0–34.0)
MCHC: 32.1 g/dL (ref 30.0–36.0)
MCV: 85.9 fL (ref 80.0–100.0)
Platelets: 120 10*3/uL — ABNORMAL LOW (ref 150–400)
RBC: 3.77 MIL/uL — ABNORMAL LOW (ref 4.22–5.81)
RDW: 16.9 % — ABNORMAL HIGH (ref 11.5–15.5)
WBC: 9.4 10*3/uL (ref 4.0–10.5)
nRBC: 0 % (ref 0.0–0.2)

## 2024-01-29 LAB — ECHOCARDIOGRAM COMPLETE
AR max vel: 1.77 cm2
AV Area VTI: 1.43 cm2
AV Area mean vel: 1.56 cm2
AV Mean grad: 10 mmHg
AV Peak grad: 16.4 mmHg
Ao pk vel: 2.03 m/s
Area-P 1/2: 2.66 cm2
Height: 78 in
MV VTI: 1.36 cm2
S' Lateral: 4.5 cm
Weight: 4172.8 [oz_av]

## 2024-01-29 LAB — HEPARIN LEVEL (UNFRACTIONATED): Heparin Unfractionated: 0.45 [IU]/mL (ref 0.30–0.70)

## 2024-01-29 LAB — GLUCOSE, CAPILLARY
Glucose-Capillary: 165 mg/dL — ABNORMAL HIGH (ref 70–99)
Glucose-Capillary: 243 mg/dL — ABNORMAL HIGH (ref 70–99)
Glucose-Capillary: 210 mg/dL — ABNORMAL HIGH (ref 70–99)
Glucose-Capillary: 181 mg/dL — ABNORMAL HIGH (ref 70–99)

## 2024-01-29 MED ORDER — VITAMIN C 500 MG PO TABS
500.0000 mg | ORAL_TABLET | Freq: Two times a day (BID) | ORAL | Status: DC
  Administered 2024-01-29 – 2024-02-06 (×14): 500 mg via ORAL
  Filled 2024-01-29 (×16): qty 1

## 2024-01-29 MED ORDER — NEPRO/CARBSTEADY PO LIQD
237.0000 mL | Freq: Two times a day (BID) | ORAL | Status: DC
  Administered 2024-01-29 – 2024-02-06 (×4): 237 mL via ORAL

## 2024-01-29 MED ORDER — METOLAZONE 2.5 MG PO TABS
2.5000 mg | ORAL_TABLET | Freq: Once | ORAL | Status: AC
  Administered 2024-01-29: 2.5 mg via ORAL
  Filled 2024-01-29: qty 1

## 2024-01-29 MED ORDER — FUROSEMIDE 10 MG/ML IJ SOLN
160.0000 mg | Freq: Once | INTRAVENOUS | Status: AC
  Administered 2024-01-29: 160 mg via INTRAVENOUS
  Filled 2024-01-29: qty 10

## 2024-01-29 MED ORDER — FUROSEMIDE 10 MG/ML IJ SOLN
160.0000 mg | Freq: Once | INTRAMUSCULAR | Status: AC
  Administered 2024-01-29: 160 mg via INTRAVENOUS
  Filled 2024-01-29: qty 16

## 2024-01-29 NOTE — Assessment & Plan Note (Addendum)
 On admission through 01-28-2024 Blood pressure within goal, being maintained with diuretics. Not on any other antihypertensives at home. -Continue to monitor  01-30-2024 stable. HTN meds on hold due to borderline low BP  01-31-2024 stable. HTN meds still on hold due to borderline low BP.  05-10-205 stable.  02-02-2024 stable  02-03-2024 stable.  02-04-2024 cardiology has started Toprol -XL 25 mg daily.

## 2024-01-29 NOTE — Progress Notes (Signed)
 ANTICOAGULATION CONSULT NOTE  Pharmacy Consult for heparin  infusion Indication: ACS/STEMI  Allergies  Allergen Reactions   Quinolones Other (See Comments)    Patient has aortic aneurysm - fluoroquinolones are contraindicated due to risk of aortic rupture.   Eliquis  [Apixaban ]     Dizziness  and vision change   Spironolactone  Other (See Comments)    dizziness    Patient Measurements: Height: 6\' 6"  (198.1 cm) Weight: 107.5 kg (236 lb 15.9 oz) IBW/kg (Calculated) : 91.4 Heparin  Dosing Weight: 115.5 kg  Vital Signs: Temp: 98.2 F (36.8 C) (05/07 0000) Temp Source: Oral (05/07 0000) BP: 118/79 (05/07 0000) Pulse Rate: 87 (05/07 0000)  Labs: Recent Labs    01/26/24 1938 01/26/24 2133 01/27/24 0151 01/27/24 0844 01/27/24 2043 01/28/24 0011 01/28/24 0401 01/28/24 1340 01/28/24 2256 01/29/24 0701  HGB  --   --  11.0*  --   --   --  10.3*  --   --  10.4*  HCT  --   --  34.6*  --   --   --  32.6*  --   --  32.4*  PLT  --   --  143*  --   --   --  117*  --   --  120*  APTT 31  --   --   --  72*  --   --   --   --   --   LABPROT  --   --   --   --  18.2*  --   --   --   --   --   INR  --   --   --   --  1.5*  --   --   --   --   --   HEPARINUNFRC  --   --   --    < >  --   --  0.20* 0.24* 0.41 0.45  CREATININE  --   --  1.56*  --  1.82*  --  2.05*  --   --  2.33*  TROPONINIHS  --    < > 326*   < > 1,336* 1,830* 1,804*  --   --   --    < > = values in this interval not displayed.    Estimated Creatinine Clearance: 33.2 mL/min (A) (by C-G formula based on SCr of 2.33 mg/dL (H)).   Medical History: Past Medical History:  Diagnosis Date   (HFpEF) heart failure with preserved ejection fraction (HCC) 03/01/2020   a.) TTE 03/01/2020: EF 55-60%, mod MAC, mod AoV sclerosis, triv AR, mild TR, mod MR, RVSP 50-59; b.) TTE 09/10/2020: EF 55-60%, mod MAC, mod AoV sclerosis, mild TR, 3+ MR, RVSP 50-59; c.) TTE 09/26/2020: EF 55-60%, mild LA dil, triv PR, mild MR/TR, RVSP 37-49    Adenoma of left adrenal gland    Anemia    Arthritis    Atrial fibrillation and flutter (HCC)    a.) CHA2DS2VASc = 5 (age x2, HFpEF, HTN, T2DM);  b.) s/p CTI ablation 09/07/2020; c.) rate/rhythm maintained on oral carvedilol ; not on chronic anticoagulation therapy   CAD (coronary artery disease)    Cardiomegaly    CKD (chronic kidney disease), stage III (HCC)    DOE (dyspnea on exertion)    Drug-induced bradycardia    Gangrene of toe of left foot (HCC)    a.) 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  HLD (hyperlipidemia)    Hypertension    Long term current use of aspirin     Lymphedema of both lower extremities    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)    a.) s/p amputation 11/04/2022   Peripheral vascular disease (HCC)    Pleural effusion on right 09/09/2020   a.) s/p RIGHT thoracentesis with 2180 cc yield   Pneumonia    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   Sick sinus syndrome [facility])    a.) s/p MDT PPM placement 09/15/2020   T2DM (type 2 diabetes mellitus) (HCC)    Urinary retention    chronic, with indwelling Foley catheter and plans for a suprapubic   Wears dentures    full upper    Assessment: Pt is a 78 yo male presenting to ED complaining of feeling of dizziness and confusion. Peak trop of 1.8K. No DOAC PTA. PMH: HFpEF, OA, afib/flutter, pacemaker, CAD, CKD 3, HLD, HTN, PAD, DM.    5/5 0844 HL 0.27    SUBtherapeutic 5/5 1826 HL 0.39    Therapeutic x 1 5/6 0401 HL 0.20    SUBtherapeutic  5/6 1340 HL 0.24    SUBtherapeutic 5/6 2256 HL 0.41    Therapeutic x 1  5/7 0701 HL 0.45    Therapeutic x 2  No bleeding noted. Hgb stable today at 10.4 and plt count of 120K. Of note, patient has previously refused oral anticoagulants.    Goal of Therapy:  Heparin  level 0.3-0.7 units/ml Monitor platelets by anticoagulation protocol: Yes   Plan:  - Continue heparin  at 2300 units/hr - Heparin  level and CBC daily   Margean Sheehan, PharmD Clinical Pharmacist 01/29/2024

## 2024-01-29 NOTE — Assessment & Plan Note (Addendum)
 01-29-2024 ID consulted. They want TEE to be performed due to high index of suspicion for endocarditis +/- PPM infection.  Cardiology deferring TEE until next week due to respiratory status.  01-30-2024 concern for endocarditis of valve or PPM leads.  01-31-2024 TEE canceled today by cards due to altered mental status. They will re-evaluate next week. Cards has stated pt is not a candidate for device extraction. Continue with IV ABX under the direction of ID service. Currently on IV rocephin  2 gram daily.  02-01-2024 pt awaiting TEE. Maybe next week. Remains on IV rocephin  2 grams daily.  02-02-2024 yesterday pt's wound cultures from foot growing enterococcus and MRSA. IV ABX changed to IV vanco which will cover group B strep in blood and MRSA/enterococcus from foot wound. NPO after MN for potential TEE in AM.  02-03-2024 Cards unable to get pt onto TEE schedule for today but will attempt to get TEE tomorrow.  On IV vanco.  02-04-2024 TEE negative for vegetations.  ID changed him back over to IV Rocephin .

## 2024-01-29 NOTE — Progress Notes (Signed)
 Date of Admission:  01/26/2024      ID: Abbey Hobby Lewman is a 78 y.o. 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)   Type II diabetes mellitus with renal manifestations (HCC)   Dyslipidemia   Essential hypertension   Acute kidney injury superimposed on stage 3a chronic kidney disease (HCC) - baseline scr 1.3   Pressure injury of skin   Septicemia due to group B Streptococcus (HCC)    Subjective: Patient is still short of breath Complains of left knee pain.  At baseline he has knee pain but this is much severe in the past 2 days and he is unable to move the leg  Medications:   vitamin C  500 mg Oral BID   aspirin  EC  81 mg Oral Daily   atorvastatin   80 mg Oral q1800   Chlorhexidine  Gluconate Cloth  6 each Topical Daily   clopidogrel   75 mg Oral Q breakfast   feeding supplement (NEPRO CARB STEADY)  237 mL Oral BID BM   gentamicin  cream  1 Application Topical BID   insulin  aspart  0-15 Units Subcutaneous TID WC   insulin  aspart  0-5 Units Subcutaneous QHS   multivitamin with minerals  1 tablet Oral Daily   silver  sulfADIAZINE    Topical Daily    Objective: Vital signs in last 24 hours: Patient Vitals for the past 24 hrs:  BP Temp Temp src Pulse Resp SpO2 Weight  01/29/24 1940 129/65 98.6 F (37 C) Oral 95 (!) 22 99 % --  01/29/24 1004 (!) 93/55 -- -- -- -- -- --  01/29/24 0958 129/79 -- -- 68 (!) 27 98 % --  01/29/24 0856 -- 97.8 F (36.6 C) -- -- -- -- --  01/29/24 0530 -- -- -- -- -- -- 107.5 kg  01/29/24 0300 114/74 98.6 F (37 C) Oral 93 (!) 25 97 % --  01/29/24 0200 124/83 -- -- (!) 59 (!) 25 98 % --  01/29/24 0000 118/79 98.2 F (36.8 C) Oral 87 (!) 25 100 % --  01/28/24 2300 120/68 -- -- 92 (!) 32 98 % --  01/28/24 2200 (!) 125/53 -- -- (!) 106 (!) 26 98 % --  01/28/24 2100 104/81 -- -- 93 (!) 25 98 % --        PHYSICAL EXAM:  General: Alert, cooperative, ill Lungs: Bilateral air entry. Baseline crepts Heart: Irregular Abdomen: Soft, non-tender,not distended. Bowel sounds normal. No masses Extremities: Left knee mildly swollen compared to the right Restricted movement Skin: No rashes or lesions. Or bruising Lymph: Cervical, supraclavicular normal. Neurologic: Grossly non-focal  Lab Results    Latest Ref Rng & Units 01/29/2024    7:01 AM 01/28/2024    4:01 AM 01/27/2024    1:51 AM  CBC  WBC 4.0 - 10.5 K/uL 9.4  13.0  21.9   Hemoglobin 13.0 - 17.0 g/dL 16.1  09.6  04.5   Hematocrit 39.0 - 52.0 % 32.4  32.6  34.6   Platelets 150 - 400 K/uL 120  117  143        Latest Ref Rng & Units 01/29/2024    7:01 AM 01/28/2024    4:01 AM 01/27/2024    8:43 PM  CMP  Glucose 70 - 99 mg/dL 409  811  914   BUN 8 - 23 mg/dL 86  61  61   Creatinine 0.61 - 1.24 mg/dL 0.86  5.78  4.69   Sodium 135 - 145 mmol/L 136  138  136   Potassium 3.5 - 5.1 mmol/L 3.9  3.3  4.0   Chloride 98 - 111 mmol/L 103  105  108   CO2 22 - 32 mmol/L 20  18  19    Calcium  8.9 - 10.3 mg/dL 8.3  8.3  8.8   Total Protein 6.5 - 8.1 g/dL   6.9   Total Bilirubin 0.0 - 1.2 mg/dL   1.2   Alkaline Phos 38 - 126 U/L   58   AST 15 - 41 U/L   28   ALT 0 - 44 U/L   22       Microbiology:  Studies/Results: US  EKG SITE RITE Result Date: 01/29/2024 If Site Rite image not attached, placement could not be confirmed due to current cardiac rhythm.  ECHOCARDIOGRAM COMPLETE Result Date: 01/29/2024    ECHOCARDIOGRAM REPORT   Patient Name:   MERT SUGIMOTO Date of Exam: 01/28/2024 Medical Rec #:  [id number]      Height:       78.0 in Accession #:    [id number]     Weight:       260.8 lb Date of Birth:  17-Jul-1946      BSA:          2.527 m Patient Age:    77 years       BP:           103/75 mmHg Patient Gender: M              HR:           96 bpm. Exam Location:  [facility] Procedure: 2D Echo, Cardiac Doppler and Color Doppler (Both Spectral and Color             Flow Doppler were utilized during procedure). Indications:     NSTEMI I21.4  History:         Patient has no prior history of Echocardiogram examinations,                  most recent 07/04/2023. CHF and Cardiomyopathy, Previous                  Myocardial Infarction, Pacemaker, PAD and CKD, stage 3,                  Arrythmias:Bradycardia, Atrial Fibrillation and Atrial Flutter,                  Signs/Symptoms:Dyspnea and Syncope; Risk Factors:Diabetes,                  Hypertension and Dyslipidemia.  Sonographer:     Terrilee Few RCS Referring Phys:  [id number] CARALYN HUDSON Diagnosing Phys: Lida Reeks Alluri IMPRESSIONS  1. Left ventricular ejection fraction, by estimation, is 25 to 30%. The left ventricle has severely decreased function. The left ventricle demonstrates global hypokinesis. There is mild left ventricular hypertrophy. Left ventricular diastolic parameters  are indeterminate.  2. Right ventricular systolic function is moderately reduced. The right ventricular size is mildly enlarged. There is severely elevated pulmonary artery systolic pressure. The estimated right ventricular systolic pressure is 73.9 mmHg.  3. Left atrial size was mildly dilated.  4. Right atrial size was moderately dilated.  5. Eccentric mitral regurgitation which can be underestimated on this study. Recommend TEE for further evaluation. Moderate to severe mitral  valve regurgitation. Mild to moderate mitral stenosis. Severe mitral annular calcification.  6. Sclerotic aortic valve with low flow low gradient aortic stenosis, probably mild to moderate.. The aortic valve is tricuspid.  7. Aortic dilatation noted. There is mild dilatation of the aortic root, measuring 44 mm. FINDINGS  Left Ventricle: Left ventricular ejection fraction, by estimation, is 25 to 30%. The left ventricle has severely decreased function. The left ventricle demonstrates global hypokinesis. There is mild left ventricular hypertrophy. Left ventricular  diastolic parameters are indeterminate. Right Ventricle: The right ventricular size is mildly enlarged. No increase in right ventricular wall thickness. Right ventricular systolic function is moderately reduced. There is severely elevated pulmonary artery systolic pressure. The tricuspid regurgitant velocity is 4.06 m/s, and with an assumed right atrial pressure of 8 mmHg, the estimated right ventricular systolic pressure is 73.9 mmHg. Left Atrium: Left atrial size was mildly dilated. Right Atrium: Right atrial size was moderately dilated. Pericardium: There is no evidence of pericardial effusion. Mitral Valve: Eccentric mitral regurgitation which can be underestimated on this study. Recommend TEE for further evaluation. There is mild thickening of the mitral valve leaflet(s). There is mild calcification of the mitral valve leaflet(s). Severe mitral annular calcification. Moderate to severe mitral valve regurgitation, with anteriorly-directed jet. Mild to moderate mitral valve stenosis. MV peak gradient, 14.7 mmHg. The mean mitral valve gradient is 8.4 mmHg. Tricuspid Valve: The tricuspid valve is normal in structure. Tricuspid valve regurgitation is mild. Aortic Valve: Sclerotic aortic valve with low flow low gradient aortic stenosis, probably mild to moderate. The aortic valve is tricuspid. Aortic valve mean gradient measures 10.0 mmHg. Aortic valve peak gradient measures 16.4 mmHg. Aortic valve area, by  VTI measures 1.43 cm. Pulmonic Valve: The pulmonic valve was not well visualized. Pulmonic valve regurgitation is trivial. Aorta: Aortic dilatation noted. There is mild dilatation of the aortic root, measuring 44 mm. IAS/Shunts: The interatrial septum was not well visualized.  LEFT VENTRICLE PLAX 2D LVIDd:         5.50 cm   Diastology LVIDs:         4.50 cm   LV e' medial:    5.87 cm/s LV PW:         1.60 cm   LV E/e' medial:  22.5 LV IVS:        1.50 cm   LV e' lateral:   10.80 cm/s LVOT diam:     2.30 cm   LV  E/e' lateral: 12.2 LV SV:         53 LV SV Index:   21 LVOT Area:     4.15 cm  RIGHT VENTRICLE            IVC RV S prime:     8.59 cm/s  IVC diam: 3.00 cm TAPSE (M-mode): 1.6 cm LEFT ATRIUM              Index        RIGHT ATRIUM           Index LA diam:        5.90 cm  2.33 cm/m   RA Area:     28.30 cm LA Vol (A2C):   114.0 ml 45.11 ml/m  RA Volume:   112.00 ml 44.32 ml/m LA Vol (A4C):   79.7 ml  31.54 ml/m LA Biplane Vol: 95.1 ml  37.63 ml/m  AORTIC VALVE AV Area (Vmax):    1.77 cm AV Area (Vmean):   1.56 cm AV Area (VTI):  1.43 cm AV Vmax:           202.50 cm/s AV Vmean:          144.500 cm/s AV VTI:            0.375 m AV Peak Grad:      16.4 mmHg AV Mean Grad:      10.0 mmHg LVOT Vmax:         86.13 cm/s LVOT Vmean:        54.333 cm/s LVOT VTI:          0.129 m LVOT/AV VTI ratio: 0.34  AORTA Ao Asc diam: 3.80 cm MITRAL VALVE                TRICUSPID VALVE MV Area (PHT): 2.66 cm     TR Peak grad:   65.9 mmHg MV Area VTI:   1.36 cm     TR Vmax:        406.00 cm/s MV Peak grad:  14.7 mmHg MV Mean grad:  8.4 mmHg     SHUNTS MV Vmax:       1.91 m/s     Systemic VTI:  0.13 m MV Vmean:      134.8 cm/s   Systemic Diam: 2.30 cm MV Decel Time: 285 msec MV E velocity: 132.00 cm/s MV A velocity: 98.50 cm/s MV E/A ratio:  1.34 Joetta Mustache Electronically signed by Joetta Mustache Signature Date/Time: 01/29/2024/1:23:56 PM    Final    CT Angio Chest Pulmonary Embolism (PE) W or WO Contrast Result Date: 01/27/2024 CLINICAL DATA:  Chest pain.  PE suspected EXAM: CT ANGIOGRAPHY CHEST WITH CONTRAST TECHNIQUE: Multidetector CT imaging of the chest was performed using the standard protocol during bolus administration of intravenous contrast. Multiplanar CT image reconstructions and MIPs were obtained to evaluate the vascular anatomy. RADIATION DOSE REDUCTION: This exam was performed according to the departmental dose-optimization program which includes automated exposure control, adjustment of the mA and/or kV  according to patient size and/or use of iterative reconstruction technique. CONTRAST:  75mL OMNIPAQUE IOHEXOL 350 MG/ML SOLN COMPARISON:  Same day chest radiograph FINDINGS: Cardiovascular: Cardiomegaly. Mitral annular calcification. Coronary artery and aortic atherosclerotic calcification. Mild dilation of the ascending aorta measuring 40 mm in diameter. No pericardial effusion. Negative for acute pulmonary embolism. Left chest wall pacemaker. Mediastinum/Nodes: Trachea and esophagus are unremarkable. Shotty mediastinal lymph nodes are likely reactive. Lungs/Pleura: Small right pleural effusion. Interlobular septal thickening and hazy ground-glass opacities with a lower lung and posterior predominance. No focal consolidation, pleural effusion, or pneumothorax. Upper Abdomen: No acute abnormality. Musculoskeletal: No acute fracture. Review of the MIP images confirms the above findings. IMPRESSION: 1. Negative for acute pulmonary embolism. 2. Findings suggestive of CHF with mild pulmonary edema. Small right pleural effusion. 3. 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 Recommend annual imaging followup by CTA or MRA. This recommendation follows 2010 ACCF/AHA/AATS/ACR/ASA/SCA/SCAI/SIR/STS/SVM Guidelines for the Diagnosis and Management of Patients with Thoracic Aortic Disease. Circulation. 2010; 121: M578-I696. Aortic aneurysm NOS (ICD10-I71.9) 4. Aortic Atherosclerosis (ICD10-I70.0). Electronically Signed   By: Rozell Cornet M.D.   On: 01/27/2024 22:11     Assessment/Plan:  Group B streptococcus bacteremia 4 out of 4 high bioburden.  With pacemaker need to rule out endocarditis and wire vegetation.  Likely will need pacemaker removal 2D echo has not shown valve vegetations so he will need TEE.  Discussed with cardiology and they will do it once he is stable Will avoid PICC line placement until the  bacteremia has cleared and also once we know the status of his heart  valves.  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 Need to rule out septic arthritis Recommend Ortho or IR consultation for ultrasound and possible aspiration of the knee  Discussed the management with the patient

## 2024-01-29 NOTE — Subjective & Objective (Addendum)
 Pt seen and examined. VBG yesterday did not show any evidence of CO2 retention. Pt in mittens this AM due to pulling at his IV. Pt diuresing without diuretics. Cards does not want to give any diuretics at this time.

## 2024-01-29 NOTE — Discharge Instructions (Signed)

## 2024-01-29 NOTE — Assessment & Plan Note (Addendum)
 On admission through 01-28-2024 Concern of NSTEMI, type I versus type II Increasing troponin, currently at 714.  Continue to have some chest discomfort. Cardiology is on board -Continue with heparin  infusion - Likely will get ischemic workup after diuresing - Continue with aspirin , Plavix  and atorvastatin   01-29-2024 remains on IV heparin  gtts. No word yet from cards on ischemic eval.  02-01-2024 remains on IV heparin . Ischemic workup at discretion of cards service.  01-30-2024 remains on IV heparin  gtts  02-01-2024 remains on IV heparin  gtts at discretion of cardiology service.  02-02-2024 remains on IV heparin  gtts at discretion of cardiology service. No decision yet made in regards to ischemic evaluation.  02-03-2024 remains on IV heparin  gtts at discretion of cardiology service. No decision yet made in regards to ischemic evaluation.  02-04-2024 pt with negative TEE for endocarditis. He remains on IV heparin . Cards to decide on ischemic eval.

## 2024-01-29 NOTE — Progress Notes (Addendum)
 Advanced Heart Failure Rounding Note  Cardiologist: None  Chief Complaint: Heart failure, NSTEMI Subjective:    Only UOP documented yesterday. Discussed with bedside RN, no report or other UOP from night shift RN but she did report bag change at some point during her shift to larger bag. SCr 2.05>2.33. Suspect weight inaccurate. WBC WNL today. Afebrile.   Blood Cx with agalactiae bacteremia. On rocephin .   Feels much better today just fatigued.   Objective:   Weight Range: 107.5 kg Body mass index is 27.39 kg/m.   Vital Signs:   Temp:  [98.2 F (36.8 C)-98.6 F (37 C)] 98.2 F (36.8 C) (05/07 0000) Pulse Rate:  [81-100] 87 (05/07 0000) Resp:  [23-36] 25 (05/07 0000) BP: (97-127)/(66-88) 118/79 (05/07 0000) SpO2:  [96 %-100 %] 100 % (05/07 0000) Weight:  [107.5 kg] 107.5 kg (05/07 0530) Last BM Date : 01/26/24  Weight change: Filed Weights   01/26/24 1926 01/29/24 0530  Weight: 118.3 kg 107.5 kg    Intake/Output:   Intake/Output Summary (Last 24 hours) at 01/29/2024 0747 Last data filed at 01/28/2024 1900 Gross per 24 hour  Intake 540 ml  Output 200 ml  Net 340 ml    Physical Exam  General:  elderly/frail appearing.  No respiratory difficulty Neck: supple. JVD ~9 cm.  Cor: PMI nondisplaced. Regular rate & rhythm. No rubs, gallops or murmurs. Lungs: clear, diminished bases Extremities: no cyanosis, clubbing, rash, +1-2 BLE edema. BLE erythema Neuro: alert & oriented x 3. Moves all 4 extremities w/o difficulty. Affect pleasant.   Telemetry   A fib 80s-90s intt paced, intt PVCs (Personally reviewed)    EKG    No new EKG to review  Labs    CBC Recent Labs    01/26/24 1936 01/27/24 0151 01/28/24 0401 01/29/24 0701  WBC 18.2*   < > 13.0* 9.4  NEUTROABS 16.2*  --   --   --   HGB 11.5*   < > 10.3* 10.4*  HCT 36.3*   < > 32.6* 32.4*  MCV 88.5   < > 87.4 85.9  PLT 164   < > 117* 120*   < > = values in this interval not displayed.   Basic  Metabolic Panel Recent Labs    60/45/40 0401 01/29/24 0701  NA 138 136  K 3.3* 3.9  CL 105 103  CO2 18* 20*  GLUCOSE 120* 160*  BUN 61* 86*  CREATININE 2.05* 2.33*  CALCIUM  8.3* 8.3*   Liver Function Tests Recent Labs    01/26/24 1936 01/27/24 2043  AST 24 28  ALT 24 22  ALKPHOS 78 58  BILITOT 0.8 1.2  PROT 7.1 6.9  ALBUMIN 3.8 3.2*   No results for input(s): "LIPASE", "AMYLASE" in the last 72 hours. Cardiac Enzymes No results for input(s): "CKTOTAL", "CKMB", "CKMBINDEX", "TROPONINI" in the last 72 hours.  BNP: BNP (last 3 results) Recent Labs    09/11/23 1627 10/28/23 1107 01/27/24 0844  BNP 3,874.2* 754.7* 4,043.5*    ProBNP (last 3 results) Recent Labs    10/02/23 1111  PROBNP 3,757*     D-Dimer No results for input(s): "DDIMER" in the last 72 hours. Hemoglobin A1C Recent Labs    01/27/24 0153  HGBA1C 6.9*   Fasting Lipid Panel No results for input(s): "CHOL", "HDL", "LDLCALC", "TRIG", "CHOLHDL", "LDLDIRECT" in the last 72 hours. Thyroid Function Tests No results for input(s): "TSH", "T4TOTAL", "T3FREE", "THYROIDAB" in the last 72 hours.  Invalid input(s): "FREET3"  Other results:   Imaging    No results found.   Medications:   Scheduled Medications:  aspirin  EC  81 mg Oral Daily   atorvastatin   80 mg Oral q1800   Chlorhexidine  Gluconate Cloth  6 each Topical Daily   clopidogrel   75 mg Oral Q breakfast   gentamicin  cream  1 Application Topical BID   insulin  aspart  0-15 Units Subcutaneous TID WC   insulin  aspart  0-5 Units Subcutaneous QHS   multivitamin with minerals  1 tablet Oral Daily   silver  sulfADIAZINE    Topical Daily    Infusions:  cefTRIAXone  (ROCEPHIN )  IV 2 g (01/28/24 1634)   heparin  2,300 Units/hr (01/28/24 2043)   sodium bicarbonate 150 mEq in sterile water 1,150 mL infusion 50 mL/hr at 01/28/24 1757    PRN Medications: acetaminophen  **OR** acetaminophen , magnesium  hydroxide, nitroGLYCERIN , ondansetron   **OR** ondansetron  (ZOFRAN ) IV, phenylephrine , traZODone   Patient Profile   Tyler [name] is a 78 y/o male with a history of persistent atrial fibrillation 06/21 (not on anticoag), HTN, T2DM, CKD, cellulitis, hyperlipidemia, 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 Admitted with A/C HFrEF, sepsis and NSTEMI.   Assessment/Plan   Acute on chronic HFrEF - Echo 07/03/23: EF 30-35% with moderate Tyler - NYHA IV on admission. Volume overloaded on exam 2/2 hx medication noncompliance, PAF, NSTEMI.  - UOP not documented overnight? weights seem inaccurate. SCr 2.05>2.33 with diuresis. Discussed with bedside RN, plan for accurate I&O today appreciate their help! Volume still appears elevated. Will try to push diuresis one more day. May be able to place PICC later today. If diuresis sluggish he may need inotropic support. Will continue to follow closely.  - Hold SGLT2i with AKI - no MRA with hx of intolerance. Can consider eplerenone if he is open to trying once renal function improves - Hold BB with volume overload and acute decompensation - Avoid hypotension. No ARB/ARNi for now.  - Lactic acid cleared. 2.3>1.5>1.4 - Will need L/RHC once renal function stabilizes.  - Place compression hose. Strict I&O, daily weights   NSTEMI - Denies new chest pain. Admitted with persistent 5/10 chest pain, has had it for years. - HsTrop 69>105>326>714>1336>1830>1804 - Continue heparin  gtt, ASA, Plavix  - Would need LHC when SCr improves - Denies CP today.    Sepsis 2/2 strep agalactiae bacteremia - Continue Rocephin   - UA with UTI with chronic foley, suspect this needs to be exchanged - Will need TEE to r/o vegetation. Friday vs early next week.  - febrile to 102.9 5/5. WBC improving 18.2>21.9>13> wnl. Now afebrile.    Persistent atrial  fibrillation - Rate controlled. A fib in 80s-90s - Continue ASA and heparin  gtt - On Plavix . Allergy to Eliquis  with dizziness and vision changes   AV block s/p MDT PPM  - Has not been compliant with EP follow ups.  - Device interrogated yesterday but not printed. Unable to view.    AKI on CKD IIIa - Baseline SCr ~1.2-1.5 - SCr up to 2.05>2.33 today - Avoid hypotension  Unable to mobilize with PT at this time 2/2 pain in BLE. Ordered incentive spirometer for him.   Length of Stay: 1  Sheryl Donna, NP  01/29/2024, 7:47 AM  Advanced Heart Failure Team Pager [phone] (M-F; 7a - 5p)  Please contact [facility] for night-coverage after hours (5p -7a )  and weekends on [email or web address]  Patient seen and examined with the above-signed Advanced Practice Provider and/or Housestaff. I personally reviewed laboratory data, imaging studies and relevant notes. I independently examined the patient and formulated the important aspects of the plan. I have edited the note to reflect any of my changes or salient points. I have personally discussed the plan with the patient and/or family.  Remains on IV abx. AFebrile. WBC decreasing.  Volume overloaded but not responding to high-dose IV diuretics. SCr increasing  General:  Lying in bed  No resp difficulty HEENT: normal Neck: supple. JVP to jaw Carotids 2+ bilat; no bruits. No lymphadenopathy or thryomegaly appreciated. Cor: PMI nondisplaced. Regular rate & rhythm. No rubs, gallops or murmurs. Lungs: clear Abdomen: obese soft, nontender, nondistended. No hepatosplenomegaly. No bruits or masses. Good bowel sounds. Extremities: no cyanosis, clubbing, rash, 2-3+ edema Neuro: alert & orientedx3, cranial nerves grossly intact. moves all 4 extremities w/o difficulty. Affect pleasant  He has progressive volume overload in setting of ATN and resuscitative volume efforts. Not responding to high-dose diuretics. Concern for potential low output (though I think more  ATN). Will place PICC to further assess cardiac output. If co-ox < 55% will start inotropic support. Will continue IV lasix .   Appreciate ID input. With strep bacteremia will need TEE. ID recommending device extraction. He is likely device dependent so will need EP to weigh in.   I d/w [facility] team and agree with plan to wait on TEE until more stable from volume and renal standpoint.   Jules Oar, MD  4:29 PM

## 2024-01-29 NOTE — TOC Initial Note (Signed)
 Transition of Care [facility]) - Initial/Assessment Note    Patient Details  Name: Tyler [name] MRN: [id number] Date of Birth: 06-16-1946  Transition of Care [facility]) CM/SW Contact:    Odilia Bennett, LCSW Phone Number: 01/29/2024, 1:00 PM  Clinical Narrative:   Readmission prevention screen complete. CSW met with patient. Roommate at bedside. CSW introduced role and explained that discharge planning would be discussed. He stated his PCP is Dr. Broadus Canes on [facility]. Patient stated he drives himself to his appointments. [facility] [address]. No issues obtaining medications. Patient lives home with his roommate. No home health or DME use prior to admission. Patient confirmed he does not wear oxygen at home. No further concerns. CSW will continue to follow patient for support and facilitate return home once stable. His daughter or roommate will transport him home at discharge.           Expected Discharge Plan: Home/Self Care Barriers to Discharge: Continued Medical Work up   Patient Goals and CMS Choice            Expected Discharge Plan and Services     Post Acute Care Choice: NA Living arrangements for the past 2 months: Single Family Home                                      Prior Living Arrangements/Services Living arrangements for the past 2 months: Single Family Home Lives with:: Roommate Patient language and need for interpreter reviewed:: Yes Do you feel safe going back to the place where you live?: Yes      Need for Family Participation in Patient Care: Yes (Comment) Care giver support system in place?: Yes (comment)   Criminal Activity/Legal Involvement Pertinent to Current Situation/Hospitalization: No - Comment as needed  Activities of Daily Living   ADL Screening (condition at time of admission) Independently performs ADLs?: No Does the patient have a NEW difficulty with bathing/dressing/toileting/self-feeding that is expected to  last >3 days?: Yes (Initiates electronic notice to provider for possible OT consult) Does the patient have a NEW difficulty with getting in/out of bed, walking, or climbing stairs that is expected to last >3 days?: Yes (Initiates electronic notice to provider for possible PT consult) Does the patient have a NEW difficulty with communication that is expected to last >3 days?: No Is the patient deaf or have difficulty hearing?: No Does the patient have difficulty seeing, even when wearing glasses/contacts?: No Does the patient have difficulty concentrating, remembering, or making decisions?: No  Permission Sought/Granted Permission sought to share information with : Family Supports Permission granted to share information with : Yes, Verbal Permission Granted        Permission granted to share info w Relationship: Roommate     Emotional Assessment Appearance:: Appears stated age Attitude/Demeanor/Rapport: Engaged Affect (typically observed): Calm Orientation: : Oriented to Self, Oriented to Place, Oriented to  Time, Oriented to Situation Alcohol / Substance Use: Not Applicable Psych Involvement: No (comment)  Admission diagnosis:  NSTEMI (non-ST elevated myocardial infarction) (HCC) [I21.4] Chronic respiratory failure with hypoxia (HCC) [J96.11] Chest pain [R07.9] Patient Active Problem List   Diagnosis Date Noted   Pressure injury of skin 01/28/2024   Acute kidney injury superimposed on chronic kidney disease (HCC) 01/27/2024   Chest pain 01/26/2024   Dyslipidemia 01/26/2024   Essential hypertension 01/26/2024   NSTEMI (non-ST elevated myocardial infarction) (HCC)  01/26/2024   Diabetic ulcer of left midfoot associated with type 2 diabetes mellitus, with fat layer exposed (HCC) 09/15/2023   Osteomyelitis of great toe of right foot (HCC) 09/13/2023   Infected plantar ulcers both feet (HCC) 09/12/2023   Osteomyelitis of right foot (HCC) 09/12/2023   CAP (community acquired pneumonia)  09/12/2023   Moderate mitral regurgitation by prior echocardiogram 09/11/2023   Nonischemic cardiomyopathy (HCC) 09/11/2023   Status cardiac pacemaker 09/11/2023   Anemia due to chronic kidney disease 09/11/2023   Uncontrolled type 2 diabetes mellitus with hyperglycemia, without long-term current use of insulin  (HCC) 09/11/2023   Chronic bilateral pleural effusions 09/11/2023   Complete heart block (HCC)  s/p PACEMAKER 09/11/2023   Type 2 diabetes mellitus with chronic kidney disease, without long-term current use of insulin  (HCC) 08/14/2023   Acute on chronic heart failure with reduced ejection fraction (HFrEF,30-35 %) (HCC) 08/13/2023   Ulcerated, foot, right, with fat layer exposed (HCC) 07/06/2023   Ulcer of left foot with fat layer exposed (HCC) 07/06/2023   Acute on chronic diastolic CHF (congestive heart failure) (HCC) 07/02/2023   Type II diabetes mellitus with renal manifestations (HCC) 07/02/2023   Chronic kidney disease, stage 3a (HCC) 07/02/2023   Myocardial injury 07/02/2023   Cellulitis of right lower extremity 07/02/2023   Epistaxis 11/05/2022   Foot infection 11/03/2022   Cellulitis of right foot 10/31/2022   Type 2 diabetes mellitus with complication, without long-term current use of insulin  (HCC) 10/31/2022   Persistent atrial fibrillation (HCC) 10/31/2022   HTN (hypertension) 10/31/2022   CKD (chronic kidney disease), stage III (HCC) 10/31/2022   Obesity (BMI 30-39.9) 10/31/2022   Gangrene of toe of right foot (HCC) 10/31/2022   PAD (peripheral artery disease) (HCC) 10/31/2022   Right foot infection 10/30/2022   Acquired absence of unspecified great toe (HCC) 10/02/2021   Chronic acquired lymphedema 09/27/2020   Typical atrial flutter (HCC) 09/27/2020   Urinary retention 09/27/2020   Urinary tract infection 09/27/2020   H/O active rheumatic fever 08/22/2020   RA (rheumatoid arthritis) (HCC) 08/22/2020   Syncope and collapse 06/28/2020   Acute on chronic  congestive heart failure (HCC) 06/28/2020   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   Diabetes mellitus without complication (HCC)    Chronic venous insufficiency 05/03/2020   Lymphedema of both lower extremities 05/03/2020   Dyspnea on exertion 03/24/2020   Edema 03/24/2020   Acute heart failure with preserved ejection fraction (HCC) 03/01/2020   AKI (acute kidney injury) (HCC) 03/01/2020   Anemia 03/01/2020   Sepsis due to cellulitis (HCC) 07/03/2018   PCP:  Jewell Mose, NP Pharmacy:   [phone] [place], [place] - [address] [place] [address] Phone: [phone] Fax: [phone]  [facility] [address] [place] [address] Phone: [phone] Fax: [phone]     Social Drivers of Health (SDOH) Social History: SDOH Screenings   Food Insecurity: No Food Insecurity (01/27/2024)  Housing: Low Risk  (01/27/2024)  Transportation Needs: No Transportation Needs (01/27/2024)  Utilities: Not At Risk (01/27/2024)  Alcohol Screen: Low Risk  (08/14/2023)  Financial Resource Strain: Low Risk  (03/20/2023)   Received from [facility]  Social Connections: Socially Isolated (01/27/2024)  Stress: No Stress Concern Present (09/07/2020)   Received from [facility]  Tobacco Use: Low Risk  (01/26/2024)   SDOH Interventions:     Readmission Risk Interventions    01/29/2024   12:58 PM 09/12/2023  12:13 PM 08/15/2023    3:57 PM  Readmission Risk Prevention Plan  Transportation Screening Complete Complete Complete  PCP or Specialist Appt within 3-5 Days  Complete Complete  HRI or Home Care Consult  Complete Complete  Social Work Consult for Recovery Care Planning/Counseling  Complete Complete  Palliative Care Screening  Not Applicable Not Applicable  Medication Review Oceanographer) Complete Complete Complete  PCP or Specialist appointment within 3-5 days of discharge  Complete    SW Recovery Care/Counseling Consult Complete    Palliative Care Screening Not Applicable    Skilled Nursing Facility Not Applicable

## 2024-01-29 NOTE — Progress Notes (Signed)
 [facility] CLINIC CARDIOLOGY PROGRESS NOTE       Patient ID: Tyler [name] MRN: [id number] DOB/AGE: 1945-12-20 78 y.o.  Admit date: 01/26/2024 Referring Physician Dr. Amalia Jung Primary Physician Jewell Mose, NP  Primary Cardiologist Dr. Bob Burn Reason for Consultation chest pain  HPI: [name] Hacking is a 78 y.o. male  with a past medical history of chronic HFrEF, persistent atrial fibrillation, mitral insufficiency, CHB s/p PPM 2021, hypertension, chronic kidney disease stage III who presented to the ED on 01/26/2024 for chest pain. Cardiology was consulted for further evaluation.   Interval history: -Patient seen and examined this morning, resting comfortably in hospital bed. -Feeling better today.  -Creatinine continues to uptrend. UOP not well documented but patient endorses good output.  -Remains on heparin  gtt.  -Blood cx positive with strep agalactiae. ID recommending TTE/TEE.   Review of systems complete and found to be negative unless listed above    Past Medical History:  Diagnosis Date   (HFpEF) heart failure with preserved ejection fraction (HCC) 03/01/2020   a.) TTE 03/01/2020: EF 55-60%, mod MAC, mod AoV sclerosis, triv AR, mild TR, mod MR, RVSP 50-59; b.) TTE 09/10/2020: EF 55-60%, mod MAC, mod AoV sclerosis, mild TR, 3+ MR, RVSP 50-59; c.) TTE 09/26/2020: EF 55-60%, mild LA dil, triv PR, mild MR/TR, RVSP 37-49   Adenoma of left adrenal gland    Anemia    Arthritis    Atrial fibrillation and flutter (HCC)    a.) CHA2DS2VASc = 5 (age x2, HFpEF, HTN, T2DM);  b.) s/p CTI ablation 09/07/2020; c.) rate/rhythm maintained on oral carvedilol ; not on chronic anticoagulation therapy   CAD (coronary artery disease)    Cardiomegaly    CKD (chronic kidney disease), stage III (HCC)    DOE (dyspnea on exertion)    Drug-induced bradycardia    Gangrene of toe of left foot (HCC)    a.) 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   Hepatosplenomegaly    History of bilateral  cataract extraction    HLD (hyperlipidemia)    Hypertension    Long term current use of aspirin     Lymphedema of both lower extremities    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)    a.) s/p amputation 11/04/2022   Peripheral vascular disease (HCC)    Pleural effusion on right 09/09/2020   a.) s/p RIGHT thoracentesis with 2180 cc yield   Pneumonia    Presence of permanent cardiac pacemaker 09/10/2020   a.) TVP placement 09/10/2020 due to intermittent CHB in setting of urosepsis; b.) s/p PPM placement 09/15/2020: MDT Azure XT DR (SN: [id number])   Pulmonary hypertension (HCC) 03/01/2020   a.) TTE: 03/01/2020: RVSP 50-59; b.) TTE 09/10/2020: RVSP 50-59; c.) TTE 09/26/2020: RVSP 37-49   RA (rheumatoid arthritis) (HCC)    Rheumatic fever    Sepsis (HCC) 09/10/2020   a.) urosepsis --> BC x 2 sets and UC all grew out significant Proteus mirabilis; admitted to [facility] 09/07/2020 - 09/27/2020.   Sick sinus syndrome [facility])    a.) s/p MDT PPM placement 09/15/2020   T2DM (type 2 diabetes mellitus) (HCC)    Urinary retention    chronic, with indwelling Foley catheter and plans for a suprapubic   Wears dentures    full upper    Past Surgical History:  Procedure Laterality Date   AMPUTATION TOE Right 11/04/2022   Procedure: AMPUTATION TOE;  Surgeon: Dot Gazella, [name];  [name];  Service: Podiatry;  Laterality:  Right;   AMPUTATION TOE Right 09/15/2023   Procedure: AMPUTATION TOE;  Surgeon: Jennefer Moats, [name];  [name];  Service: Orthopedics/Podiatry;  Laterality: Right;  Right hallux amputation   BONE BIOPSY Right 04/05/2023   Procedure: BONE BIOPSY THIRD & FOURTH;  Surgeon: Dot Gazella, [name];  [name];  Service: Podiatry;  Laterality: Right;   CARDIAC ELECTROPHYSIOLOGY STUDY AND ABLATION N/A 09/07/2020   Procedure: CARDIAC EP STUDY AND ABLATION (CTI)   CATARACT EXTRACTION W/PHACO Right 01/16/2017   Procedure: CATARACT EXTRACTION PHACO  AND INTRAOCULAR LENS PLACEMENT (IOC)  Right Complicated;  Surgeon: Annell Kidney, MD;  Location: [facility];  Service: Ophthalmology;  Laterality: Right;  IVA Block Healon 5 malyugin vision blue Diabetic - oral meds   CATARACT EXTRACTION W/PHACO Left 10/23/2022   Procedure: CATARACT EXTRACTION PHACO AND INTRAOCULAR LENS PLACEMENT (IOC) LEFT DIABETIC;  Surgeon: Clair Crews, MD;  Location: [facility];  Service: Ophthalmology;  Laterality: Left;  Diabetic   COLONOSCOPY     INCISION AND DRAINAGE OF WOUND Left 09/15/2023   Procedure: IRRIGATION AND DEBRIDEMENT WOUND;  Surgeon: Jennefer Moats, [name];  [name];  Service: Orthopedics/Podiatry;  Laterality: Left;  Left foot wound debridement, possible graft/biopsy   LOWER EXTREMITY ANGIOGRAPHY Right 11/02/2022   Procedure: Lower Extremity Angiography;  Surgeon: Celso College, MD;  Location: [facility] INVASIVE [facility];  Service: Cardiovascular;  Laterality: Right;   LOWER EXTREMITY ANGIOGRAPHY Right 09/13/2023   Procedure: Lower Extremity Angiography;  Surgeon: Jackquelyn Mass, MD;  Location: [facility] INVASIVE CV LAB;  Service: Cardiovascular;  Laterality: Right;   METATARSAL HEAD EXCISION Left 07/05/2018   Procedure: RESECTION FIRST METATARSAL INFECTED BONE AND SOFT TISSUE;  Surgeon: Sharlyn Deaner, [name];  [name];  Service: Podiatry;  Laterality: Left;   METATARSAL HEAD EXCISION Right 04/05/2023   Procedure: METATARSAL HEAD EXCISION THIRD & FOURTH;  Surgeon: Dot Gazella, [name];  [name];  Service: Podiatry;  Laterality: Right;   PACEMAKER INSERTION  09/15/2020   TOE AMPUTATION Left    TONSILLECTOMY      Medications Prior to Admission  Medication Sig Dispense Refill Last Dose/Taking   acetaminophen  (TYLENOL ) 500 MG tablet Take 2 tablets (1,000 mg total) by mouth 3 (three) times daily as needed (for pain).   Taking As Needed   aspirin  EC 81 MG tablet Take 1 tablet (81 mg total) by mouth daily.  Swallow whole. 30 tablet 0 01/25/2024   atorvastatin  (LIPITOR) 10 MG tablet Take 1 tablet (10 mg total) by mouth daily at 6 PM. 30 tablet 11 Taking   bumetanide  (BUMEX ) 2 MG tablet Take 1 tablet (2 mg total) by mouth daily. 90 tablet 3 Past Week   clopidogrel  (PLAVIX ) 75 MG tablet Take 1 tablet (75 mg total) by mouth daily with breakfast. 30 tablet 11 01/25/2024 Morning   Finerenone  (KERENDIA ) 10 MG TABS Take 1 tablet (10 mg total) by mouth daily. 30 tablet 5 Past Week   furosemide  (LASIX ) 40 MG tablet Take 40 mg by mouth 2 (two) times daily.   01/25/2024   gentamicin  cream (GARAMYCIN ) 0.1 % Apply 1 Application topically 2 (two) times daily. 30 g 1 Taking   glipiZIDE  (GLUCOTROL ) 10 MG tablet Take 1 tablet (10 mg total) by mouth 2 (two) times daily. 60 tablet 2 Past Month   losartan  (COZAAR ) 25 MG tablet Take 25 mg by mouth daily.   Taking   metolazone  (ZAROXOLYN ) 2.5 MG tablet Take 1 tablet (2.5 mg total) by mouth  2 (two) times a week. On Mondays & Fridays   Past Month   Multiple Vitamins-Minerals (MULTIVITAMIN WITH MINERALS) tablet Take 1 tablet by mouth daily.   01/25/2024   silver  sulfADIAZINE  (SILVADENE ) 1 % cream Apply to affected area daily 50 g 1 Taking   doxycycline  (VIBRA -TABS) 100 MG tablet Take 1 tablet (100 mg total) by mouth 2 (two) times daily. (Patient not taking: Reported on 01/26/2024) 20 tablet 0 Not Taking   metFORMIN  (GLUCOPHAGE ) 1000 MG tablet Take 1 tablet (1,000 mg total) by mouth 2 (two) times daily. (Patient not taking: Reported on 01/26/2024) 60 tablet 2 Not Taking   Social History   Socioeconomic History   Marital status: Single    Spouse name: Not on file   Number of children: Not on file   Years of education: Not on file   Highest education level: Not on file  Occupational History    Comment: [employer]  Tobacco Use   Smoking status: Never   Smokeless tobacco: Never  Vaping Use   Vaping status: Never Used  Substance and Sexual Activity   Alcohol use: Not  Currently    Comment: rarely   Drug use: Never   Sexual activity: Not Currently    Birth control/protection: None  Other Topics Concern   Not on file  Social History Narrative   Lives with roommate   Social Drivers of Health   Financial Resource Strain: Low Risk  (03/20/2023)   Received from [facility], [facility]   Overall Financial Resource Strain (CARDIA)    Difficulty of Paying Living Expenses: Not hard at all  Food Insecurity: No Food Insecurity (01/27/2024)   Hunger Vital Sign    Worried About Running Out of Food in the Last Year: Never true    Ran Out of Food in the Last Year: Never true  Transportation Needs: No Transportation Needs (01/27/2024)   PRAPARE - Administrator, Civil Service (Medical): No    Lack of Transportation (Non-Medical): No  Physical Activity: Not on file  Stress: No Stress Concern Present (09/07/2020)   Received from [facility], [facility] of Occupational Health - Occupational Stress Questionnaire    Feeling of Stress : Not at all  Social Connections: Socially Isolated (01/27/2024)   Social Connection and Isolation Panel [NHANES]    Frequency of Communication with Friends and Family: More than three times a week    Frequency of Social Gatherings with Friends and Family: Twice a week    Attends Religious Services: Never    Database administrator or Organizations: No    Attends Banker Meetings: Never    Marital Status: Divorced  Catering manager Violence: Not At Risk (01/27/2024)   Humiliation, Afraid, Rape, and Kick questionnaire    Fear of Current or Ex-Partner: No    Emotionally Abused: No    Physically Abused: No    Sexually Abused: No    Family History  Problem Relation Age of Onset   Cancer Niece      Vitals:   01/29/24 0000 01/29/24 0200 01/29/24 0300 01/29/24 0530  BP: 118/79 124/83 114/74   Pulse: 87 (!) 59 93   Resp: (!) 25 (!) 25 (!) 25   Temp: 98.2 F (36.8 C)  98.6 F (37 C)    TempSrc: Oral  Oral   SpO2: 100% 98% 97%   Weight:    107.5 kg  Height:  PHYSICAL EXAM General: chronically ill appearing elderly male, well nourished, in no acute distress. HEENT: Normocephalic and atraumatic. Neck: No JVD.  Lungs: Normal respiratory effort on 2L. Bibasilar crackles. Heart: HRRR. Normal S1 and S2 without gallops or murmurs.  Abdomen: Non-distended appearing.  Msk: Normal strength and tone for age. Extremities: Warm and well perfused. No clubbing, cyanosis. 2+ pitting edema bilaterally.  Neuro: Alert and oriented X 3. Psych: Answers questions appropriately.   Labs: Basic Metabolic Panel: Recent Labs    01/28/24 0401 01/29/24 0701  NA 138 136  K 3.3* 3.9  CL 105 103  CO2 18* 20*  GLUCOSE 120* 160*  BUN 61* 86*  CREATININE 2.05* 2.33*  CALCIUM  8.3* 8.3*   Liver Function Tests: Recent Labs    01/26/24 1936 01/27/24 2043  AST 24 28  ALT 24 22  ALKPHOS 78 58  BILITOT 0.8 1.2  PROT 7.1 6.9  ALBUMIN 3.8 3.2*   No results for input(s): "LIPASE", "AMYLASE" in the last 72 hours. CBC: Recent Labs    01/26/24 1936 01/27/24 0151 01/28/24 0401 01/29/24 0701  WBC 18.2*   < > 13.0* 9.4  NEUTROABS 16.2*  --   --   --   HGB 11.5*   < > 10.3* 10.4*  HCT 36.3*   < > 32.6* 32.4*  MCV 88.5   < > 87.4 85.9  PLT 164   < > 117* 120*   < > = values in this interval not displayed.   Cardiac Enzymes: Recent Labs    01/27/24 2043 01/28/24 0011 01/28/24 0401  TROPONINIHS 1,336* 1,830* 1,804*   BNP: Recent Labs    01/27/24 0844  BNP 4,043.5*   D-Dimer: No results for input(s): "DDIMER" in the last 72 hours. Hemoglobin A1C: Recent Labs    01/27/24 0153  HGBA1C 6.9*   Fasting Lipid Panel: No results for input(s): "CHOL", "HDL", "LDLCALC", "TRIG", "CHOLHDL", "LDLDIRECT" in the last 72 hours. Thyroid Function Tests: No results for input(s): "TSH", "T4TOTAL", "T3FREE", "THYROIDAB" in the last 72 hours.  Invalid input(s): "FREET3" Anemia  Panel: No results for input(s): "VITAMINB12", "FOLATE", "FERRITIN", "TIBC", "IRON", "RETICCTPCT" in the last 72 hours.   Radiology: CT Angio Chest Pulmonary Embolism (PE) W or WO Contrast Result Date: 01/27/2024 CLINICAL DATA:  Chest pain.  PE suspected EXAM: CT ANGIOGRAPHY CHEST WITH CONTRAST TECHNIQUE: Multidetector CT imaging of the chest was performed using the standard protocol during bolus administration of intravenous contrast. Multiplanar CT image reconstructions and MIPs were obtained to evaluate the vascular anatomy. RADIATION DOSE REDUCTION: This exam was performed according to the departmental dose-optimization program which includes automated exposure control, adjustment of the mA and/or kV according to patient size and/or use of iterative reconstruction technique. CONTRAST:  75mL OMNIPAQUE IOHEXOL 350 MG/ML SOLN COMPARISON:  Same day chest radiograph FINDINGS: Cardiovascular: Cardiomegaly. Mitral annular calcification. Coronary artery and aortic atherosclerotic calcification. Mild dilation of the ascending aorta measuring 40 mm in diameter. No pericardial effusion. Negative for acute pulmonary embolism. Left chest wall pacemaker. Mediastinum/Nodes: Trachea and esophagus are unremarkable. Shotty mediastinal lymph nodes are likely reactive. Lungs/Pleura: Small right pleural effusion. Interlobular septal thickening and hazy ground-glass opacities with a lower lung and posterior predominance. No focal consolidation, pleural effusion, or pneumothorax. Upper Abdomen: No acute abnormality. Musculoskeletal: No acute fracture. Review of the MIP images confirms the above findings. IMPRESSION: 1. Negative for acute pulmonary embolism. 2. Findings suggestive of CHF with mild pulmonary edema. Small right pleural effusion. 3. 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  Recommend annual imaging followup by CTA or MRA. This recommendation follows 2010 ACCF/AHA/AATS/ACR/ASA/SCA/SCAI/SIR/STS/SVM Guidelines for the  Diagnosis and Management of Patients with Thoracic Aortic Disease. Circulation. 2010; 121: Z610-R604. Aortic aneurysm NOS (ICD10-I71.9) 4. Aortic Atherosclerosis (ICD10-I70.0). Electronically Signed   By: Rozell Cornet M.D.   On: 01/27/2024 22:11   CT Head Wo Contrast Result Date: 01/26/2024 CLINICAL DATA:  Mental status change. EXAM: CT HEAD WITHOUT CONTRAST TECHNIQUE: Contiguous axial images were obtained from the base of the skull through the vertex without intravenous contrast. RADIATION DOSE REDUCTION: This exam was performed according to the departmental dose-optimization program which includes automated exposure control, adjustment of the mA and/or kV according to patient size and/or use of iterative reconstruction technique. COMPARISON:  Head CT 07/04/2018. FINDINGS: Brain: No evidence of acute infarction, hemorrhage, hydrocephalus, extra-axial collection or mass lesion/mass effect. There is mild diffuse atrophy and mild periventricular white matter hypodensity which is similar to the prior study. Vascular: Atherosclerotic calcifications are present within the cavernous internal carotid arteries. Skull: Normal. Negative for fracture or focal lesion. Sinuses/Orbits: No acute finding. Other: None. IMPRESSION: 1. No acute intracranial process. 2. Mild diffuse atrophy and mild chronic small vessel ischemic changes. Electronically Signed   By: Tyron Gallon M.D.   On: 01/26/2024 20:18   DG Chest Portable 1 View Result Date: 01/26/2024 CLINICAL DATA:  Chest pain EXAM: PORTABLE CHEST 1 VIEW COMPARISON:  09/11/2023 FINDINGS: Left-sided pacing device similar in position. Cardiomegaly with mild central congestion. No consolidation, pleural effusion or pneumothorax. IMPRESSION: Cardiomegaly with mild central congestion. Electronically Signed   By: Esmeralda Hedge M.D.   On: 01/26/2024 20:14    ECHO pending  TELEMETRY reviewed by me 01/29/2024: Paced rhythm with PVCs  EKG reviewed by me: atrial fibrillation,  rate 112 bpm PVCs.    Data reviewed by me 01/29/2024: last 24h vitals tele labs imaging I/O's hospitalist progress note, rapid response notes, advanced heart failure notes  Principal Problem:   Chest pain Active Problems:   Sepsis due to cellulitis (HCC)   Acute on chronic diastolic CHF (congestive heart failure) (HCC)   Type II diabetes mellitus with renal manifestations (HCC)   Dyslipidemia   Essential hypertension   NSTEMI (non-ST elevated myocardial infarction) (HCC)   Acute kidney injury superimposed on chronic kidney disease (HCC)   Pressure injury of skin    ASSESSMENT AND PLAN:  Atthew Mundinger Gagner is a 78 y.o. male  with a past medical history of chronic HFrEF, persistent atrial fibrillation, mitral insufficiency, CHB s/p PPM 2021, hypertension, chronic kidney disease stage III who presented to the ED on 01/26/2024 for chest pain. Cardiology was consulted for further evaluation.   # Severe sepsis # Strep agalactiae bacteremia Rapid response called overnight 5/6 for acute decompensated respiratory status, tachycardia, fever.  Meets sepsis criteria. Blood cultures growing strep agalactiae with high bioburden.  -ID following, appreciate recommendations.  -TTE pending review. ID recommends TEE, will consider once patient is more stable, likely not until next week.   # NSTEMI, type I vs type II # Acute on chronic HFrEF Patient reports to ED due to worsening SOB. Patient was seen outpatient cardiology on 09/2023 and there was plan for ischemic eval with PE stress test, however patient did not perform this exam or follow-up since this visit. Patient appears fluid overload on exam. Troponins elevated and trending 69 > 105 > 326 > 714 > 1300 > 1800. EKG in ED with AF PVCs with no acute ischemic changes, rate 112 bpm. Cr  is elevated at 1.56 (baseline around 1.35). BP is stable. BNP elevated at 4043.  Rapid response called overnight 5/6 for acute decompensated respiratory status, tachycardia, fever.   Meets sepsis criteria. -TTE pending.  -Advanced heart failure following.  Appreciate recommendations. -S/p IV lasix  120 mg yesterday, further diuresis recommendations per advanced heart failure.  -Home GDMT currently held given renal function, acute decompensated HF.  -Continue heparin  gtt.  -Continue ASA 81 mg, clopidogrel  75 mg, atorvastatin  80 mg daily. -After diuresis and patient more stable from HF perspective will decide on ischemic evaluation.   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 Underwent PPM implant in 2021 for CHB, has had regular device checks since implant. EKG in ED and per tele this AM AF rates in 100s.  -Beta-blocker as above. Will resume for rate control when patient is more stable from HF perspective. -Recommend anticoagulation however patient has historically deferred given prior bleeding issues.  # Chronic kidney disease stage IIIa Patient with hx of CKD, baseline Cr 1.3-1.4. Cr this AM 2.33. -Continue to monitor renal function closely during diuresis. -Management per primary.    This patient's plan of care was discussed and created with Dr. Bob Burn and he is in agreement.  Signed: Hamp Levine, PA-C  01/29/2024, 8:25 AM [facility] Cardiology

## 2024-01-29 NOTE — Progress Notes (Signed)
 PICC order received. Discussed with pt, consent obtained. RN made aware PICC will not be placed tonight.

## 2024-01-29 NOTE — Evaluation (Signed)
 Clinical/Bedside Swallow Evaluation Patient Details  Name: Tyler [name] MRN: [id number] Date of Birth: [birth year]  Today's Date: 01/29/2024 Time: SLP Start Time (ACUTE ONLY): 1315 SLP Stop Time (ACUTE ONLY): 1405 SLP Time Calculation (min) (ACUTE ONLY): 50 min  Past Medical History:  Past Medical History:  Diagnosis Date   (HFpEF) heart failure with preserved ejection fraction (HCC) 03/01/2020   a.) TTE 03/01/2020: EF 55-60%, mod MAC, mod AoV sclerosis, triv AR, mild TR, mod MR, RVSP 50-59; b.) TTE 09/10/2020: EF 55-60%, mod MAC, mod AoV sclerosis, mild TR, 3+ MR, RVSP 50-59; c.) TTE 09/26/2020: EF 55-60%, mild LA dil, triv PR, mild MR/TR, RVSP 37-49   Adenoma of left adrenal gland    Anemia    Arthritis    Atrial fibrillation and flutter (HCC)    a.) CHA2DS2VASc = 5 (age x2, HFpEF, HTN, T2DM);  b.) s/p CTI ablation 09/07/2020; c.) rate/rhythm maintained on oral carvedilol ; not on chronic anticoagulation therapy   CAD (coronary artery disease)    Cardiomegaly    CKD (chronic kidney disease), stage III (HCC)    DOE (dyspnea on exertion)    Drug-induced bradycardia    Gangrene of toe of left foot (HCC)    a.) 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   H/O active rheumatic fever 08/22/2020   Hepatosplenomegaly    History of bilateral cataract extraction    HLD (hyperlipidemia)    Hypertension    Long term current use of aspirin     Lymphedema of both lower extremities    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)    a.) s/p amputation 11/04/2022   Peripheral vascular disease (HCC)    Pleural effusion on right 09/09/2020   a.) s/p RIGHT thoracentesis with 2180 cc yield   Pneumonia    Presence of permanent cardiac pacemaker 09/10/2020   a.) TVP placement 09/10/2020 due to intermittent CHB in setting of urosepsis; b.) s/p PPM placement 09/15/2020: MDT Azure XT DR (SN: [id number])   Pulmonary hypertension (HCC) 03/01/2020   a.) TTE: 03/01/2020: RVSP 50-59; b.) TTE 09/10/2020:  RVSP 50-59; c.) TTE 09/26/2020: RVSP 37-49   RA (rheumatoid arthritis) (HCC)    Rheumatic fever    Sepsis (HCC) 09/10/2020   a.) urosepsis --> BC x 2 sets and UC all grew out significant Proteus mirabilis; admitted to [facility] 09/07/2020 - 09/27/2020.   Sick sinus syndrome [facility])    a.) s/p MDT PPM placement 09/15/2020   T2DM (type 2 diabetes mellitus) (HCC)    Urinary retention    chronic, with indwelling Foley catheter and plans for a suprapubic   Wears dentures    full upper   Past Surgical History:  Past Surgical History:  Procedure Laterality Date   AMPUTATION TOE Right 11/04/2022   Procedure: AMPUTATION TOE;  Surgeon: Dot Gazella, [name];  [name];  Service: Podiatry;  Laterality: Right;   AMPUTATION TOE Right 09/15/2023   Procedure: AMPUTATION TOE;  Surgeon: Jennefer Moats, [name];  [name];  Service: Orthopedics/Podiatry;  Laterality: Right;  Right hallux amputation   BONE BIOPSY Right 04/05/2023   Procedure: BONE BIOPSY THIRD & FOURTH;  Surgeon: Dot Gazella, [name];  [name];  Service: Podiatry;  Laterality: Right;   CARDIAC ELECTROPHYSIOLOGY STUDY AND ABLATION N/A 09/07/2020   Procedure: CARDIAC EP STUDY AND ABLATION (CTI)   CATARACT EXTRACTION W/PHACO Right 01/16/2017   Procedure: CATARACT EXTRACTION PHACO AND INTRAOCULAR LENS PLACEMENT (IOC)  Right Complicated;  Surgeon: Annell Kidney,  MD;  Location: [facility];  Service: Ophthalmology;  Laterality: Right;  IVA Block Healon 5 malyugin vision blue Diabetic - oral meds   CATARACT EXTRACTION W/PHACO Left 10/23/2022   Procedure: CATARACT EXTRACTION PHACO AND INTRAOCULAR LENS PLACEMENT (IOC) LEFT DIABETIC;  Surgeon: Clair Crews, MD;  Location: [facility];  Service: Ophthalmology;  Laterality: Left;  Diabetic   COLONOSCOPY     INCISION AND DRAINAGE OF WOUND Left 09/15/2023   Procedure: IRRIGATION AND DEBRIDEMENT WOUND;  Surgeon: Jennefer Moats, [name];   [name];  Service: Orthopedics/Podiatry;  Laterality: Left;  Left foot wound debridement, possible graft/biopsy   LOWER EXTREMITY ANGIOGRAPHY Right 11/02/2022   Procedure: Lower Extremity Angiography;  Surgeon: Celso College, MD;  Location: [facility];  Service: Cardiovascular;  Laterality: Right;   LOWER EXTREMITY ANGIOGRAPHY Right 09/13/2023   Procedure: Lower Extremity Angiography;  Surgeon: Jackquelyn Mass, MD;  Location: [facility];  Service: Cardiovascular;  Laterality: Right;   METATARSAL HEAD EXCISION Left 07/05/2018   Procedure: RESECTION FIRST METATARSAL INFECTED BONE AND SOFT TISSUE;  Surgeon: Sharlyn Deaner, [name];  [name];  Service: Podiatry;  Laterality: Left;   METATARSAL HEAD EXCISION Right 04/05/2023   Procedure: METATARSAL HEAD EXCISION THIRD & FOURTH;  Surgeon: Dot Gazella, [name];  [name];  Service: Podiatry;  Laterality: Right;   PACEMAKER INSERTION  09/15/2020   TOE AMPUTATION Left    TONSILLECTOMY     HPI:  Pt is a 78 y.o. Caucasian male with medical history significant for Multiple medical dxs including Advanced heart failure, HFpEF, foot wound care for toe amputations, lymphedema, osteoarthritis, osteomyelitis w/ toe amputation, r pleural effusion w/ drain, atrial fibrillation and flutter, coronary artery disease, RA, stage III chronic kidney disease, PVD, dyslipidemia and hypertension, as well as type 2 diabetes mellitus, who presented to the emergency room with a cancer of dizziness, lightheadedness and confusion that started around 4 PM today when he came back home from work.  This was associated with chest pain and dyspnea.  He describes his chest pain as dull aching graded 5/10 in severity with no radiation, nausea or vomiting or diaphoresis or palpitations.  His symptoms later resolved spontaneously.   Chest CT imaging: Lungs/Pleura: Small right pleural effusion. Interlobular septal  thickening and hazy ground-glass  opacities with a lower lung and  posterior predominance. No focal consolidation, pleural effusion, or  pneumothorax.  Findings suggestive of CHF.  Pt admitted w/ dxs including: Severe sepsis, Strep agalactiae bacteremia; NSTEMI.   Assessment / Plan / Recommendation  Clinical Impression   Pt seen for BSE today. Pt awake, resting in bed. Pt verbally engaged; responses adequately appropriate and/or w/ cue. He appeared fatigued and initially declined po's stating "I'm fine w/ my swallowing". NSG reported coughing/choking episode when drinking liquid earlier today. Pt stated he needed to sit up more when it happened. Pt was also using a straw to drink. He denies any h/o swallowing issues yesterday when initiating contact w/ him; none noted per chart. Pt has No neurological history per chart.  On [place] O2 2L; afebrile. WBC WNL.  Pt appears to present w/ grossly functional oropharyngeal phase swallowing when following general aspiration precautions, w/ No overt oropharyngeal phase dysphagia noted, No neuromuscular deficits noted. Pt consumed po trials w/ No immediate, overt clinical s/s of aspiration during po trials when following precautions(no straws).  Pt appears at reduced risk for aspiration when following general aspiration precautions and using a slightly modified diet  for ease of chewing/mastication w/ solids -- for conservation of energy. Pt does have challenging factors that could impact oropharyngeal swallowing to include fatigue/weakness, lengthy illness w/ Multiple Comorbidities, CardioPulmonary decline, and hospitalization. These factors can increase risk for aspiration, dysphagia as well as decreased oral intake overall.   During po trials, pt consumed consistencies w/ no overt coughing, decline in vocal quality, or change in respiratory presentation during/post trials. O2 sats remained 96-98% during. Oral phase appeared [facility] w/ timely bolus management, mastication, and control of bolus propulsion for  A-P transfer for swallowing. Oral clearing achieved w/ all trial consistencies -- moistened, soft foods AND breaks b/t bites/sips given.  OM Exam appeared [facility] w/ no unilateral weakness noted. Speech Clear. No Bottom Dentition. Pt helped to feed self by holding cup and w/ MOD+ setup support.   Recommend a more Mech Soft consistency diet w/ well-Cut meats, moistened foods for conservation of energy when chewing; Thin liquids -- NO STRAWS, and pt should help to Hold Cup when drinking. Recommend general aspiration precautions, reduce distractions and Talking during oral intake. Tray setup and sitting Up support for po intake. Small bites/sips Slowly. Pills WHOLE in Puree for safer, easier swallowing -- encouraged now and for D/C to the pt.   Education given on Pills in Puree; food consistencies and easy to eat options; general aspiration precautions to pt. NSG updated, agreed. MD updated. ST services will f/u next 1-2 days for ongoing education, monitoring. Recommended Palliative Care f/u for GOC overall. Recommend Dietician f/u for support. Precautions posted in room, chart. SLP Visit Diagnosis: Dysphagia, unspecified (R13.10) (potential Esophageal phase dysmotility -- Belching noted b/t trials)    Aspiration Risk  Mild aspiration risk;Risk for inadequate nutrition/hydration (reduced when following general precautions)    Diet Recommendation   Thin;Dysphagia 3 (mechanical soft) (gravies to moisten) = a more Mech Soft consistency diet w/ well-Cut meats, moistened foods for conservation of energy when chewing; Thin liquids -- NO STRAWS, and pt should help to Hold Cup when drinking. Recommend general aspiration precautions, reduce distractions and Talking during oral intake. Tray setup and sitting Up support for po intake. Small bites/sips Slowly.   Medication Administration: Whole meds with puree    Other  Recommendations Recommended Consults:  (Dietician; Palliative Care f/u for GOC) Oral Care  Recommendations: Oral care BID;Oral care before and after PO;Staff/trained caregiver to provide oral care ([facility] care)    Recommendations for follow up therapy are one component of a multi-disciplinary discharge planning process, led by the attending physician.  Recommendations may be updated based on patient status, additional functional criteria and insurance authorization.  Follow up Recommendations Follow physician's recommendations for discharge plan and follow up therapies (TBD)      Assistance Recommended at Discharge  Intermittent for setup, precautions  Functional Status Assessment Patient has had a recent decline in their functional status and demonstrates the ability to make significant improvements in function in a reasonable and predictable amount of time.  Frequency and Duration min 2x/week  2 weeks       Prognosis Prognosis for improved oropharyngeal function: Fair (-Good) Barriers to Reach Goals: Time post onset;Severity of deficits;Behavior;Motivation Barriers/Prognosis Comment: potential Esophageal phase dysmotility -- Belching noted b/t trials; Motivation      Swallow Study   General Date of Onset: 01/26/24 HPI: Pt is a 78 y.o. Caucasian male with medical history significant for NSTEMI, HFpEF, wound care for toe amputation, lymphedema, osteoarthritis, osteomyelitis w/ toe amputation, r pleural effusion w/ drain, atrial fibrillation and  flutter, coronary artery disease, RA, stage III chronic kidney disease, PVD, dyslipidemia and hypertension, as well as type 2 diabetes mellitus, who presented to the emergency room with a cancer of dizziness, lightheadedness and confusion that started around 4 PM today when he came back home from work.  This was associated with chest pain and dyspnea.  He describes his chest pain as dull aching graded 5/10 in severity with no radiation, nausea or vomiting or diaphoresis or palpitations.  His symptoms later resolved spontaneously.   Chest CT  imaging: Lungs/Pleura: Small right pleural effusion. Interlobular septal  thickening and hazy ground-glass opacities with a lower lung and  posterior predominance. No focal consolidation, pleural effusion, or  pneumothorax.  Findings suggestive of CHF. Type of Study: Bedside Swallow Evaluation Previous Swallow Assessment: none Diet Prior to this Study: Regular;Thin liquids (Level 0) Temperature Spikes Noted: No (wbc 9.4) Respiratory Status: Nasal cannula (2L) History of Recent Intubation: No Behavior/Cognition: Alert;Cooperative;Pleasant mood;Distractible;Requires cueing Oral Cavity Assessment: Within Functional Limits Oral Care Completed by SLP: Yes Oral Cavity - Dentition: Dentures, top (no bottom dentition) Vision: Functional for self-feeding Self-Feeding Abilities: Able to feed self;Needs assist;Needs set up Patient Positioning: Upright in bed (MOD-MAX support) Baseline Vocal Quality: Normal Volitional Cough: Strong Volitional Swallow: Able to elicit    Oral/Motor/Sensory Function Overall Oral Motor/Sensory Function: Within functional limits   Ice Chips Ice chips: Not tested   Thin Liquid Thin Liquid: Within functional limits Presentation: Cup;Self Fed (10 trials) Other Comments: 1 trial via straw = coughing (larger sip too)    Nectar Thick Nectar Thick Liquid: Not tested   Honey Thick Honey Thick Liquid: Not tested   Puree Puree: Within functional limits Presentation: Spoon (fed; 6 trials)   Solid     Solid: Within functional limits (grossly) Presentation: Spoon (fed; 6 trials) Other Comments: moistened well        Darla Edward, MS, CCC-SLP Speech Language Pathologist Rehab Services; [facility] [phone] (ascom) Julias Mould 01/29/2024,5:37 PM

## 2024-01-29 NOTE — Assessment & Plan Note (Addendum)
 On admission through 01-28-2024. Continue atorvastatin   01-30-2024 stable.  01-31-2024 stable  05-10-205 stable.  02-02-2024 stable  02-03-2024 stable.  02-04-2024 stable

## 2024-01-29 NOTE — Progress Notes (Addendum)
 PROGRESS NOTE    Tyler [name]  OVF:[id number] DOB: 05-17-1946 DOA: 01/26/2024 PCP: McClanahan, Kyra, NP  Subjective: Pt seen and examined. Mildly SOB. No complains.   Hospital Course: Taken from H&P.  Tyler [name] is a 78 y.o. 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  He was also experiencing 5/10, nonradiating dull chest pain with no other associated symptoms.  On presentation patient has mild tachycardia.  Labs with BUN of 41, creatinine 1.5, baseline seems to be around 1.3, CO2 of 17, troponin 69>.105 EKG shows A-fib with RVR, PVCs and LBBB. CT head was negative for any acute intracranial abnormality CXR with cardiomegaly and mild central congestion.  Patient was initially started on heparin  infusion and cardiology was consulted.  5/5: Vital stable.  Worsening leukocytosis at 21.9, platelet at 143, none anion gap metabolic acid with worsening of bicarb to 16, BNP significantly elevated at 4043. Troponin continued to increase, currently at 714. Cardiology started him on IV Bumex .  Echocardiogram ordered  5/6: Rapid response was called overnight when patient became very tachycardic and tachypneic, found to be febrile at 102.9.  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 Blood cultures started growing group B streptococcus this morning-antibiotics are being switched to  ceftriaxone .  ID was consulted.  Patient also has a pacemaker in place. Rising troponin, maximum recorded at 1830, improving leukocytosis, potassium 3.3 and worsening creatinine at 2.05 so further IV Bumex  was held this morning.  HPI: Tyler [name] is a 78 y.o. Caucasian male with medical history significant for HFpEF, osteoarthritis, atrial fibrillation and flutter, coronary artery disease, stage III chronic kidney disease, dyslipidemia and hypertension, as well as type 2 diabetes mellitus, who presented to the emergency room with a cancer of dizziness, lightheadedness and confusion that started around 4 PM today when he came back home from work.  This was associated with chest pain and dyspnea.  He describes his chest pain as dull aching graded 5/10 in severity with no radiation, nausea or vomiting or diaphoresis or palpitations.  His symptoms later resolved spontaneously.    He feels tactile fever without chills.  No cough or wheezing or vomitus.  No leg pain or edema recent travel or surgery.  No dysuria, oliguria or hematuria or flank pain.  No bleeding diathesis.   The patient was started on IV heparin  per ACS protocol. He will be admitted to a cardiac telemetry observation bed for further evaluation and management.   Significant Events: Admitted 01/26/2024 for chest pain 01-27-2024 blood cx positive for Group B Strep agalactiae  Significant Labs: Na 137, k 4.2, CO2 of 17, BUN 41, Scr 1.5, glu 189 Ca 9.3, TP 71. Alb 3.8 WBC 18.2, HgB 11.5, plt 164 Tropoin I 69-->105-->326 A1c 6.9% BNP 4043 01-27-2024 Blood cx Group B strep agalactiae  Significant Imaging Studies: CT head No acute intracranial process. 2. Mild diffuse atrophy and mild chronic small vessel ischemic changes CXR Cardiomegaly with mild central congestion  CTPA Negative for acute pulmonary embolism.  2. Findings suggestive of CHF with mild pulmonary edema. Small right pleural effusion. 3. 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  Recommend annual imaging followup by CTA or MRA. 01-28-2024 echo LVEF 25%  Antibiotic Therapy: Anti-infectives (From admission, onward)    Start     Dose/Rate Route Frequency Ordered Stop   01/28/24 1600  vancomycin  (VANCOREADY) IVPB 2000 mg/400 mL  Status:  Discontinued        2,000 mg 200 mL/hr over 120 Minutes Intravenous Every 24 hours 01/27/24 2009 01/28/24 0844   01/28/24 1600  cefTRIAXone  (ROCEPHIN ) 2 g in sodium chloride  0.9 % 100 mL IVPB        2 g 200 mL/hr over 30 Minutes Intravenous Daily 01/28/24 0844     01/28/24 0800  ceFEPIme  (MAXIPIME ) 2 g in sodium chloride  0.9 % 100 mL IVPB  Status:  Discontinued        2 g 200 mL/hr over 30 Minutes Intravenous Every 12 hours 01/27/24 1950 01/28/24 0844   01/27/24 2030  ceFEPIme  (MAXIPIME ) 2 g in sodium chloride  0.9 % 100 mL IVPB        2 g 200 mL/hr over 30 Minutes Intravenous  Once 01/27/24 1942 01/27/24 2242   01/27/24 2030  metroNIDAZOLE  (FLAGYL ) IVPB 500 mg  Status:  Discontinued        500 mg 100 mL/hr over 60 Minutes Intravenous Every 12 hours 01/27/24 1942 01/28/24 0844   01/27/24 2030  vancomycin  (VANCOCIN ) IVPB 1000 mg/200 mL premix  Status:  Discontinued        1,000 mg 200 mL/hr over 60 Minutes Intravenous  Once 01/27/24 1942 01/27/24 1948   01/27/24 1945  vancomycin  (VANCOREADY) IVPB 2000 mg/400 mL        2,000 mg 200 mL/hr over 120 Minutes Intravenous  Once 01/27/24 1948 01/27/24 2304   01/26/24 2300  doxycycline  (VIBRA -TABS) tablet 100 mg  Status:  Discontinued        100 mg Oral 2 times daily 01/26/24 2254 01/26/24 2346       Procedures:   Consultants: Cardiology CHF ID    Assessment and Plan: * NSTEMI (non-ST elevated myocardial infarction) (HCC) On admission through 01-28-2024 Concern of NSTEMI, type I versus type II Increasing troponin, currently at 714.  Continue to have some chest discomfort. Cardiology is on board -Continue with heparin  infusion - Likely will get ischemic workup after  diuresing - Continue with aspirin , Plavix  and atorvastatin   01-29-2024 remains on IV heparin  gtts. No word yet from cards on ischemic eval.   Septicemia due to group B Streptococcus (HCC) 01-29-2024 ID consulted. They want TEE to be performed due to high index of suspicion for endocarditis +/- PPM infection.  Cardiology deferring TEE until next week due to respiratory status.  Acute kidney injury superimposed on stage 3a chronic kidney disease (HCC) - baseline scr 1.3 On admission through 01-28-2024. Worsening creatinine, AKI on chronic CKD stage IIIa. Worsening non-anion gap metabolic acidosis, multifactorial with AKI, CHF and also concern of sepsis. IV Bumex  was held by cardiology today -Giving gentle IV fluid with bicarb infusion -Monitor renal function -Avoid nephrotoxins  01-29-2024 worsening Scr today. Scr up to 2.33 today. Given his low EF I will avoid IVF for now. Cards gave him 160 mg of IV lasix  today.  Defer to cardiology to manage his AKI/CKD if they are continuing his diuresis.  Acute on chronic systolic CHF (congestive heart failure) (HCC) On admission through 01-28-2024 Patient with volume overload, positive JVD and elevated BNP  at 4043 Echo cardiogram ordered-pending -Cardiology started him on IV Bumex  2 mg twice daily which was held today due to worsening creatinine -Advanced heart failure team consulted -Continue with metolazone  -Daily weight and BMP -Strict intake and output  01-29-2024 will let cards and CHF team manage pt's volume status with diuretics, etc.  Sepsis due to cellulitis [facility]) On admission through 01-28-2024 Patient met sepsis criteria after developing high-grade fever with tachycardia overnight.  Likely due to lower extremity cellulitis and bilateral multiple foot wounds. Received broad-spectrum antibiotics overnight Preliminary blood cultures with group B strep agalactia - De-escalate antibiotics to ceftriaxone  - ID was consulted - Echocardiogram  pending  01-29-2024 ID consulting. Concern for potential endocarditis +/- PPM infection.  Pressure injury of skin Pressure Injury 01/27/24 Heel Right Unstageable - Full thickness tissue loss in which the base of the injury is covered by slough (yellow, tan, gray, green or brown) and/or eschar (tan, brown or black) in the wound bed. (Active)  01/27/24 2000  Location: Heel  Location Orientation: Right  Staging: Unstageable - Full thickness tissue loss in which the base of the injury is covered by slough (yellow, tan, gray, green or brown) and/or eschar (tan, brown or black) in the wound bed.  Wound Description (Comments):   Present on Admission: Yes     Pressure Injury 01/27/24 Heel Right Stage 2 -  Partial thickness loss of dermis presenting as a shallow open injury with a red, pink wound bed without slough. (Active)  01/27/24 2000  Location: Heel  Location Orientation: Right  Staging: Stage 2 -  Partial thickness loss of dermis presenting as a shallow open injury with a red, pink wound bed without slough.  Wound Description (Comments):   Present on Admission: Yes     Pressure Injury 01/27/24 Heel Left Stage 2 -  Partial thickness loss of dermis presenting as a shallow open injury with a red, pink wound bed without slough. (Active)  01/27/24 2000  Location: Heel  Location Orientation: Left  Staging: Stage 2 -  Partial thickness loss of dermis presenting as a shallow open injury with a red, pink wound bed without slough.  Wound Description (Comments):   Present on Admission: Yes      Essential hypertension On admission through 01-28-2024 Blood pressure within goal, being maintained with diuretics. Not on any other antihypertensives at home. -Continue to monitor  Dyslipidemia On admission through 01-28-2024. Continue atorvastatin   Type II diabetes mellitus with renal manifestations (HCC) On admission through 01-28-2024 Will place the patient on supplement coverage with NovoLog . -  Holding home glipizide  and metformin .  01-29-2024 stable.   DVT prophylaxis: Place TED hose Start: 01/29/24 0753  IV Heparin    Code Status: Full Code Family Communication: discussed with dtr jennifer mcbride. Gave her update on how sick her father is. Disposition Plan: unknown Reason for continuing need for hospitalization: remains on IV heparin  and IV abx.  Objective: Vitals:   01/29/24 0530 01/29/24 0856 01/29/24 0958 01/29/24 1004  BP:   129/79 (!) 93/55  Pulse:   68   Resp:   (!) 27   Temp:  97.8 F (36.6 C)    TempSrc:      SpO2:   98%   Weight: 107.5 kg     Height:        Intake/Output Summary (Last 24 hours) at 01/29/2024 1641 Last data filed at 01/29/2024 1508 Gross per 24 hour  Intake 480 ml  Output 975 ml  Net -495 ml  Filed Weights   01/26/24 1926 01/29/24 0530  Weight: 118.3 kg 107.5 kg    Examination:  Physical Exam Vitals and nursing note reviewed.  Constitutional:      Comments: Appears chronically ill  HENT:     Head: Normocephalic and atraumatic.  Eyes:     General: No scleral icterus. Cardiovascular:     Rate and Rhythm: Normal rate and regular rhythm.     Heart sounds: Murmur heard.  Pulmonary:     Effort: Pulmonary effort is normal. No respiratory distress.     Breath sounds: Decreased air movement present.  Abdominal:     General: Bowel sounds are normal.     Palpations: Abdomen is soft.  Musculoskeletal:     Right lower leg: Edema present.     Left lower leg: Edema present.  Skin:    General: Skin is warm and dry.     Capillary Refill: Capillary refill takes less than 2 seconds.     Data Reviewed: I have personally reviewed following labs and imaging studies  CBC: Recent Labs  Lab 01/26/24 1936 01/27/24 0151 01/28/24 0401 01/29/24 0701  WBC 18.2* 21.9* 13.0* 9.4  NEUTROABS 16.2*  --   --   --   HGB 11.5* 11.0* 10.3* 10.4*  HCT 36.3* 34.6* 32.6* 32.4*  MCV 88.5 87.8 87.4 85.9  PLT 164 143* 117* 120*   Basic Metabolic  Panel: Recent Labs  Lab 01/26/24 1936 01/27/24 0151 01/27/24 2043 01/28/24 0401 01/29/24 0701  NA 137 136 136 138 136  K 4.2 4.4 4.0 3.3* 3.9  CL 108 109 108 105 103  CO2 17* 16* 19* 18* 20*  GLUCOSE 189* 223* 145* 120* 160*  BUN 41* 45* 61* 61* 86*  CREATININE 1.50* 1.56* 1.82* 2.05* 2.33*  CALCIUM  9.3 8.6* 8.8* 8.3* 8.3*   GFR: Estimated Creatinine Clearance: 33.2 mL/min (A) (by C-G formula based on SCr of 2.33 mg/dL (H)). Liver Function Tests: Recent Labs  Lab 01/26/24 1936 01/27/24 2043  AST 24 28  ALT 24 22  ALKPHOS 78 58  BILITOT 0.8 1.2  PROT 7.1 6.9  ALBUMIN 3.8 3.2*   Coagulation Profile: Recent Labs  Lab 01/27/24 2043  INR 1.5*   BNP (last 3 results) Recent Labs    09/11/23 1627 10/28/23 1107 01/27/24 0844  BNP 3,874.2* 754.7* 4,043.5*   HbA1C: Recent Labs    01/27/24 0153  HGBA1C 6.9*   CBG: Recent Labs  Lab 01/28/24 1459 01/28/24 1818 01/28/24 2107 01/29/24 0854 01/29/24 1258  GLUCAP 166* 157* 149* 165* 243*   Sepsis Labs: Recent Labs  Lab 01/27/24 1826 01/27/24 2043 01/27/24 2207  PROCALCITON  --  7.52  --   LATICACIDVEN 2.3* 1.5 1.4    Recent Results (from the past 240 hours)  Culture, blood (x 2)     Status: Abnormal (Preliminary result)   Collection Time: 01/27/24  8:43 PM   Specimen: BLOOD  Result Value Ref Range Status   Specimen Description   Final    BLOOD BLOOD LEFT ARM LAC Performed at [facility], [address], [place], [place] [address]    Special Requests   Final    BOTTLES DRAWN AEROBIC AND ANAEROBIC Blood Culture adequate volume Performed at [facility], [address] [place], [place] [address]    Culture  Setup Time   Final    GRAM POSITIVE COCCI IN BOTH AEROBIC AND ANAEROBIC BOTTLES CRITICAL RESULT CALLED TO, READ BACK BY AND VERIFIED WITH: TREY GREENWOOD  01/28/24 0811 MW GRAM STAIN REVIEWED-AGREE WITH RESULT DRT Performed at [facility], [address]  [place], [place] [address]    Culture GROUP B STREP(S.AGALACTIAE)ISOLATED (A)  Final   Report Status PENDING  Incomplete  Culture, blood (x 2)     Status: Abnormal (Preliminary result)   Collection Time: 01/27/24  8:49 PM   Specimen: BLOOD  Result Value Ref Range Status   Specimen Description   Final    BLOOD BLOOD LEFT HAND [facility] Performed at [facility], [address] [place], [place] [address]    Special Requests   Final    BOTTLES DRAWN AEROBIC AND ANAEROBIC Blood Culture adequate volume Performed at [facility], [address], [place], [place] [address]    Culture  Setup Time   Final    GRAM POSITIVE COCCI IN BOTH AEROBIC AND ANAEROBIC BOTTLES Organism ID to follow CRITICAL RESULT CALLED TO, READ BACK BY AND VERIFIED WITH: Kathyanne Parkers GREENWOOD 01/28/24 0811 MW GRAM STAIN REVIEWED-AGREE WITH RESULT DRT Performed at [facility], [address], [place], [place] [address]    Culture GROUP B STREP(S.AGALACTIAE)ISOLATED (A)  Final   Report Status PENDING  Incomplete  Blood Culture ID Panel (Reflexed)     Status: Abnormal   Collection Time: 01/27/24  8:49 PM  Result Value Ref Range Status   Enterococcus faecalis NOT DETECTED NOT DETECTED Final   Enterococcus Faecium NOT DETECTED NOT DETECTED Final   Listeria monocytogenes NOT DETECTED NOT DETECTED Final   Staphylococcus species NOT DETECTED NOT DETECTED Final   Staphylococcus aureus (BCID) NOT DETECTED NOT DETECTED Final   Staphylococcus epidermidis NOT DETECTED NOT DETECTED Final   Staphylococcus lugdunensis NOT DETECTED NOT DETECTED Final   Streptococcus species DETECTED (A) NOT DETECTED Final    Comment: CRITICAL RESULT CALLED TO, READ BACK BY AND VERIFIED WITH: TREY GREENWOOD 01/28/24 0811 MW    Streptococcus agalactiae DETECTED (A) NOT DETECTED Final    Comment: CRITICAL RESULT CALLED TO, READ BACK BY AND VERIFIED WITH: TREY GREENWOOD 01/28/24 0811 MW    Streptococcus pneumoniae NOT DETECTED NOT DETECTED  Final   Streptococcus pyogenes NOT DETECTED NOT DETECTED Final   A.calcoaceticus-baumannii NOT DETECTED NOT DETECTED Final   Bacteroides fragilis NOT DETECTED NOT DETECTED Final   Enterobacterales NOT DETECTED NOT DETECTED Final   Enterobacter cloacae complex NOT DETECTED NOT DETECTED Final   Escherichia coli NOT DETECTED NOT DETECTED Final   Klebsiella aerogenes NOT DETECTED NOT DETECTED Final   Klebsiella oxytoca NOT DETECTED NOT DETECTED Final   Klebsiella pneumoniae NOT DETECTED NOT DETECTED Final   Proteus species NOT DETECTED NOT DETECTED Final   Salmonella species NOT DETECTED NOT DETECTED Final   Serratia marcescens NOT DETECTED NOT DETECTED Final   Haemophilus influenzae NOT DETECTED NOT DETECTED Final   Neisseria meningitidis NOT DETECTED NOT DETECTED Final   Pseudomonas aeruginosa NOT DETECTED NOT DETECTED Final   Stenotrophomonas maltophilia NOT DETECTED NOT DETECTED Final   Candida albicans NOT DETECTED NOT DETECTED Final   Candida auris NOT DETECTED NOT DETECTED Final   Candida glabrata NOT DETECTED NOT DETECTED Final   Candida krusei NOT DETECTED NOT DETECTED Final   Candida parapsilosis NOT DETECTED NOT DETECTED Final   Candida tropicalis NOT DETECTED NOT DETECTED Final   Cryptococcus neoformans/gattii NOT DETECTED NOT DETECTED Final    Comment: Performed at [facility], [address] [place], [place] [address]  MRSA Next Gen by PCR, Nasal     Status: None  Collection Time: 01/28/24  1:00 AM   Specimen: Nasal Mucosa; Nasal Swab  Result Value Ref Range Status   MRSA by PCR Next Gen NOT DETECTED NOT DETECTED Final    Comment: (NOTE) The GeneXpert MRSA Assay (FDA approved for NASAL specimens only), is one component of a comprehensive MRSA colonization surveillance program. It is not intended to diagnose MRSA infection nor to guide or monitor treatment for MRSA infections. Test performance is not FDA approved in patients less than 66 years old. Performed  at [facility], [address] [place], [place] [address]   Aerobic Culture w Gram Stain (superficial specimen)     Status: None (Preliminary result)   Collection Time: 01/29/24  6:35 AM   Specimen: Foot; Wound  Result Value Ref Range Status   Specimen Description   Final    FOOT RIGHT Performed at [facility], [address], [place], [place] [address]    Special Requests   Final    NONE Performed at [facility], [address] [place], [place] [address]    Gram Stain   Final    NO WBC SEEN RARE GRAM POSITIVE COCCI IN PAIRS Performed at [facility], [address] [place], [place] [address]    Culture PENDING  Incomplete   Report Status PENDING  Incomplete  Aerobic Culture w Gram Stain (superficial specimen)     Status: None (Preliminary result)   Collection Time: 01/29/24  6:37 AM   Specimen: Foot; Wound  Result Value Ref Range Status   Specimen Description   Final    FOOT LEFT Performed at [facility], [address] [place], [place] [address]    Special Requests   Final    NONE Performed at [facility], [address] [place], [place] [address]    Gram Stain   Final    NO WBC SEEN RARE GRAM POSITIVE COCCI IN PAIRS Performed at [facility], [address], [place], [place] [address]    Culture PENDING  Incomplete   Report Status PENDING  Incomplete     Radiology Studies: US  EKG SITE RITE Result Date: 01/29/2024 If Site Rite image not attached, placement could not be confirmed due to current cardiac rhythm.  ECHOCARDIOGRAM COMPLETE Result Date: 01/29/2024    ECHOCARDIOGRAM REPORT   Patient Name:   BRYCIN CARRAWAY Date of Exam: 01/28/2024 Medical Rec #:  [id number]      Height:       78.0 in Accession #:    [id number]     Weight:       260.8 lb Date of Birth:  1946-07-08      BSA:          2.527 m Patient Age:    77 years       BP:           103/75 mmHg Patient Gender: M              HR:           96  bpm. Exam Location:  [facility] Procedure: 2D Echo, Cardiac Doppler and Color Doppler (Both Spectral and Color            Flow Doppler were utilized during procedure). Indications:     NSTEMI I21.4  History:         Patient has no prior history of Echocardiogram examinations,  most recent 07/04/2023. CHF and Cardiomyopathy, Previous                  Myocardial Infarction, Pacemaker, PAD and CKD, stage 3,                  Arrythmias:Bradycardia, Atrial Fibrillation and Atrial Flutter,                  Signs/Symptoms:Dyspnea and Syncope; Risk Factors:Diabetes,                  Hypertension and Dyslipidemia.  Sonographer:     Terrilee Few RCS Referring Phys:  [id number] CARALYN HUDSON Diagnosing Phys: Lida Reeks Alluri IMPRESSIONS  1. Left ventricular ejection fraction, by estimation, is 25 to 30%. The left ventricle has severely decreased function. The left ventricle demonstrates global hypokinesis. There is mild left ventricular hypertrophy. Left ventricular diastolic parameters  are indeterminate.  2. Right ventricular systolic function is moderately reduced. The right ventricular size is mildly enlarged. There is severely elevated pulmonary artery systolic pressure. The estimated right ventricular systolic pressure is 73.9 mmHg.  3. Left atrial size was mildly dilated.  4. Right atrial size was moderately dilated.  5. Eccentric mitral regurgitation which can be underestimated on this study. Recommend TEE for further evaluation. Moderate to severe mitral valve regurgitation. Mild to moderate mitral stenosis. Severe mitral annular calcification.  6. Sclerotic aortic valve with low flow low gradient aortic stenosis, probably mild to moderate.. The aortic valve is tricuspid.  7. Aortic dilatation noted. There is mild dilatation of the aortic root, measuring 44 mm. FINDINGS  Left Ventricle: Left ventricular ejection fraction, by estimation, is 25 to 30%. The left ventricle has severely decreased function. The  left ventricle demonstrates global hypokinesis. There is mild left ventricular hypertrophy. Left ventricular diastolic parameters are indeterminate. Right Ventricle: The right ventricular size is mildly enlarged. No increase in right ventricular wall thickness. Right ventricular systolic function is moderately reduced. There is severely elevated pulmonary artery systolic pressure. The tricuspid regurgitant velocity is 4.06 m/s, and with an assumed right atrial pressure of 8 mmHg, the estimated right ventricular systolic pressure is 73.9 mmHg. Left Atrium: Left atrial size was mildly dilated. Right Atrium: Right atrial size was moderately dilated. Pericardium: There is no evidence of pericardial effusion. Mitral Valve: Eccentric mitral regurgitation which can be underestimated on this study. Recommend TEE for further evaluation. There is mild thickening of the mitral valve leaflet(s). There is mild calcification of the mitral valve leaflet(s). Severe mitral annular calcification. Moderate to severe mitral valve regurgitation, with anteriorly-directed jet. Mild to moderate mitral valve stenosis. MV peak gradient, 14.7 mmHg. The mean mitral valve gradient is 8.4 mmHg. Tricuspid Valve: The tricuspid valve is normal in structure. Tricuspid valve regurgitation is mild. Aortic Valve: Sclerotic aortic valve with low flow low gradient aortic stenosis, probably mild to moderate. The aortic valve is tricuspid. Aortic valve mean gradient measures 10.0 mmHg. Aortic valve peak gradient measures 16.4 mmHg. Aortic valve area, by  VTI measures 1.43 cm. Pulmonic Valve: The pulmonic valve was not well visualized. Pulmonic valve regurgitation is trivial. Aorta: Aortic dilatation noted. There is mild dilatation of the aortic root, measuring 44 mm. IAS/Shunts: The interatrial septum was not well visualized.  LEFT VENTRICLE PLAX 2D LVIDd:         5.50 cm   Diastology LVIDs:         4.50 cm   LV e' medial:    5.87 cm/s LV PW:  1.60  cm   LV E/e' medial:  22.5 LV IVS:        1.50 cm   LV e' lateral:   10.80 cm/s LVOT diam:     2.30 cm   LV E/e' lateral: 12.2 LV SV:         53 LV SV Index:   21 LVOT Area:     4.15 cm  RIGHT VENTRICLE            IVC RV S prime:     8.59 cm/s  IVC diam: 3.00 cm TAPSE (M-mode): 1.6 cm LEFT ATRIUM              Index        RIGHT ATRIUM           Index LA diam:        5.90 cm  2.33 cm/m   RA Area:     28.30 cm LA Vol (A2C):   114.0 ml 45.11 ml/m  RA Volume:   112.00 ml 44.32 ml/m LA Vol (A4C):   79.7 ml  31.54 ml/m LA Biplane Vol: 95.1 ml  37.63 ml/m  AORTIC VALVE AV Area (Vmax):    1.77 cm AV Area (Vmean):   1.56 cm AV Area (VTI):     1.43 cm AV Vmax:           202.50 cm/s AV Vmean:          144.500 cm/s AV VTI:            0.375 m AV Peak Grad:      16.4 mmHg AV Mean Grad:      10.0 mmHg LVOT Vmax:         86.13 cm/s LVOT Vmean:        54.333 cm/s LVOT VTI:          0.129 m LVOT/AV VTI ratio: 0.34  AORTA Ao Asc diam: 3.80 cm MITRAL VALVE                TRICUSPID VALVE MV Area (PHT): 2.66 cm     TR Peak grad:   65.9 mmHg MV Area VTI:   1.36 cm     TR Vmax:        406.00 cm/s MV Peak grad:  14.7 mmHg MV Mean grad:  8.4 mmHg     SHUNTS MV Vmax:       1.91 m/s     Systemic VTI:  0.13 m MV Vmean:      134.8 cm/s   Systemic Diam: 2.30 cm MV Decel Time: 285 msec MV E velocity: 132.00 cm/s MV A velocity: 98.50 cm/s MV E/A ratio:  1.34 Joetta Mustache Electronically signed by Joetta Mustache Signature Date/Time: 01/29/2024/1:23:56 PM    Final    CT Angio Chest Pulmonary Embolism (PE) W or WO Contrast Result Date: 01/27/2024 CLINICAL DATA:  Chest pain.  PE suspected EXAM: CT ANGIOGRAPHY CHEST WITH CONTRAST TECHNIQUE: Multidetector CT imaging of the chest was performed using the standard protocol during bolus administration of intravenous contrast. Multiplanar CT image reconstructions and MIPs were obtained to evaluate the vascular anatomy. RADIATION DOSE REDUCTION: This exam was performed according to the  departmental dose-optimization program which includes automated exposure control, adjustment of the mA and/or kV according to patient size and/or use of iterative reconstruction technique. CONTRAST:  75mL OMNIPAQUE IOHEXOL 350 MG/ML SOLN COMPARISON:  Same day chest radiograph FINDINGS: Cardiovascular: Cardiomegaly. Mitral annular calcification. Coronary artery and aortic atherosclerotic calcification.  Mild dilation of the ascending aorta measuring 40 mm in diameter. No pericardial effusion. Negative for acute pulmonary embolism. Left chest wall pacemaker. Mediastinum/Nodes: Trachea and esophagus are unremarkable. Shotty mediastinal lymph nodes are likely reactive. Lungs/Pleura: Small right pleural effusion. Interlobular septal thickening and hazy ground-glass opacities with a lower lung and posterior predominance. No focal consolidation, pleural effusion, or pneumothorax. Upper Abdomen: No acute abnormality. Musculoskeletal: No acute fracture. Review of the MIP images confirms the above findings. IMPRESSION: 1. Negative for acute pulmonary embolism. 2. Findings suggestive of CHF with mild pulmonary edema. Small right pleural effusion. 3. 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 Recommend annual imaging followup by CTA or MRA. This recommendation follows 2010 ACCF/AHA/AATS/ACR/ASA/SCA/SCAI/SIR/STS/SVM Guidelines for the Diagnosis and Management of Patients with Thoracic Aortic Disease. Circulation. 2010; 121: Z610-R604. Aortic aneurysm NOS (ICD10-I71.9) 4. Aortic Atherosclerosis (ICD10-I70.0). Electronically Signed   By: Rozell Cornet M.D.   On: 01/27/2024 22:11    Scheduled Meds:  vitamin C  500 mg Oral BID   aspirin  EC  81 mg Oral Daily   atorvastatin   80 mg Oral q1800   Chlorhexidine  Gluconate Cloth  6 each Topical Daily   clopidogrel   75 mg Oral Q breakfast   feeding supplement (NEPRO CARB STEADY)  237 mL Oral BID BM   gentamicin  cream  1 Application Topical BID   insulin  aspart  0-15 Units  Subcutaneous TID WC   insulin  aspart  0-5 Units Subcutaneous QHS   multivitamin with minerals  1 tablet Oral Daily   silver  sulfADIAZINE    Topical Daily   Continuous Infusions:  cefTRIAXone  (ROCEPHIN )  IV 2 g (01/29/24 1409)   furosemide      heparin  2,300 Units/hr (01/29/24 0844)     LOS: 1 day   Time spent: 45 minutes  Unk Garb, DO  [facility]  01/29/2024, 4:41 PM

## 2024-01-29 NOTE — Assessment & Plan Note (Addendum)
 On admission through 01-28-2024 Will place the patient on supplement coverage with NovoLog . - Holding home glipizide  and metformin .  01-29-2024 stable.  01-30-2024 stable.  01-31-2024 stable  05-10-205 stable.  02-02-2024 stable. Acceptable CBG ranges.

## 2024-01-29 NOTE — Evaluation (Signed)
 Occupational Therapy Evaluation Patient Details Name: Tyler [name] MRN: [id number] DOB: 04/05/1946 Today's Date: 01/29/2024   History of Present Illness   Pt is a 78 year old male Admitted with A/C HFrEF, sepsis and NSTEMI.     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     Clinical Impressions Chart reviewed, pt greeted in bed, flat affect but alert and oriented x4, agreeable to OT evaluation. PTA pt is MOD I-I in ADL/IADL, amb with no AD, still working in [employer]. Of note, pt has 13 STE apartment. Pt presents with deficits in strength, endurance, activity tolerance, balance, affecting safe and optimal ADL completion. Fatigue is a limiting factor in ADL performance on this date but pt benefits from any attempts at mobility to preserve function. MOD A +1-2 required for supine<>sit, good static sitting balance on edge of bed for approx 5-7 minutes. Initially pt reports dizziness, but resolved after approx 1 minute. Discussed discharge recommendations with pt, will continue ongoing educations regarding safe ADL completion/continuing to progress mobility. Pt is performing ADL below PLOF, will benefit from acute OT to address functional deficits. Pt is left as received, all needs met. Nurse updated regarding pt status. OT will continue to follow acutely.      If plan is discharge home, recommend the following:   Two people to help with walking and/or transfers;A lot of help with bathing/dressing/bathroom;Assistance with cooking/housework;Help with stairs or ramp for entrance     Functional Status Assessment   Patient has had a recent decline in their functional status and demonstrates the ability  to make significant improvements in function in a reasonable and predictable amount of time.     Equipment Recommendations   BSC/3in1;Tub/shower seat;Wheelchair (measurements OT)     Recommendations for Other Services         Precautions/Restrictions   Precautions Precautions: Fall Recall of Precautions/Restrictions: Intact Restrictions Weight Bearing Restrictions Per Provider Order:  (see below) Other Position/Activity Restrictions: per podiatry note on 4/29 "Patient brought his compression socks today.  No multilayer Unna boot compression wrap was applied today.  Wear compression socks daily  -Continue to advise against going barefoot.  Patient currently wearing good supportive tennis shoes and sneakers.  Continue"     Mobility Bed Mobility Overal bed mobility: Needs Assistance Bed Mobility: Supine to Sit, Sit to Supine     Supine to sit: Mod assist, HOB elevated, Used rails Sit to supine: Mod assist, +2 for safety/equipment, +2 for physical assistance, Used rails   General bed mobility comments: MOD A +2 for boost in bed using bed features    Transfers                   General transfer comment: declined on this date due to fatigue      Balance Overall balance assessment: Needs assistance Sitting-balance support: Feet supported Sitting balance-Leahy Scale: Good Sitting balance - Comments: static sitting on edge of bed for approx 5-7 minutes                                   ADL either performed or assessed with clinical judgement   ADL Overall ADL's : Needs assistance/impaired Eating/Feeding:  Set up   Grooming: Set up;Bed level;Wash/dry face           Upper Body Dressing : Minimal assistance Upper Body Dressing Details (indicate cue type and reason): anticipate Lower Body Dressing: Maximal assistance Lower Body Dressing Details (indicate cue type and reason): anticipate               General ADL Comments: ADL  participation limited due to reported fatigue on this date     Vision Baseline Vision/History: 1 Wears glasses Patient Visual Report: No change from baseline       Perception         Praxis         Pertinent Vitals/Pain Pain Assessment Pain Assessment: 0-10 Pain Score: 2  Pain Location: L knee- reports it feels arthritic Pain Descriptors / Indicators: Grimacing Pain Intervention(s): Monitored during session, Limited activity within patient's tolerance, Repositioned     Extremity/Trunk Assessment Upper Extremity Assessment Upper Extremity Assessment: Overall WFL for tasks assessed   Lower Extremity Assessment Lower Extremity Assessment: Generalized weakness;Defer to PT evaluation;RLE deficits/detail (unable to perform SLR with LLE) RLE Deficits / Details: RLE distal LE appears red, warm to touch; no pain reported from patient; nurse notified       Communication Communication Communication: No apparent difficulties   Cognition Arousal: Alert Behavior During Therapy: WFL for tasks assessed/performed, Flat affect Cognition: No apparent impairments                               Following commands: Intact       Cueing  General Comments   Cueing Techniques: Verbal cues  pt reports feeling dizzy after supine>sit, BP WNL, resolved after approx 1 minute; spo2 >90% on 1L via [place] throughout, HR 68 bpm while sitting on edge of bed   Exercises Other Exercises Other Exercises: edu re: role of OT, role of rehab, discharge recommendations, safe ADL completion, importance of progressing mobility   Shoulder Instructions      Home Living Family/patient expects to be discharged to:: Private residence Living Arrangements: Non-relatives/Friends (roommate) Available Help at Discharge: Friend(s);Available PRN/intermittently Type of Home: Apartment Home Access: Stairs to enter Entrance Stairs-Number of Steps: 13 steps to 2nd floor apt Entrance Stairs-Rails:  Left Home Layout: One level     Bathroom Shower/Tub: Producer, television/film/video: Standard Bathroom Accessibility: Yes   Home Equipment: Grab bars - tub/shower;Rolling Walker (2 wheels);Cane - single point          Prior Functioning/Environment Prior Level of Function : Independent/Modified Independent;Driving;Working/employed             Mobility Comments: amb with no AD, works in [employer] ADLs Comments: MOD I-I in ADL/IADL per pt report    OT Problem List: Decreased strength;Decreased knowledge of use of DME or AE;Decreased activity tolerance;Cardiopulmonary status limiting activity;Impaired balance (sitting and/or standing)   OT Treatment/Interventions: Self-care/ADL training;Therapeutic exercise;Patient/family education;Balance training;DME and/or AE instruction;Cognitive remediation/compensation;Therapeutic activities;Energy conservation      OT Goals(Current goals can be found in the care plan section)   Acute Rehab OT Goals Patient Stated Goal: go home OT Goal Formulation: With patient Time For Goal Achievement: 02/12/24 Potential to Achieve Goals: Fair ADL Goals Pt Will Perform Grooming: with modified independence;with total assist;standing Pt Will Perform Lower Body Dressing: with modified independence;sitting/lateral leans;sit to/from stand Pt Will Transfer to Toilet: with modified independence;ambulating Pt Will Perform Toileting - Clothing Manipulation and hygiene: with modified  independence;sit to/from stand;sitting/lateral leans   OT Frequency:  Min 2X/week    Co-evaluation              AM-PAC OT "6 Clicks" Daily Activity     Outcome Measure Help from another person eating meals?: None Help from another person taking care of personal grooming?: A Little Help from another person toileting, which includes using toliet, bedpan, or urinal?: A Lot Help from another person bathing (including washing, rinsing, drying)?: A Lot Help from  another person to put on and taking off regular upper body clothing?: A Little Help from another person to put on and taking off regular lower body clothing?: A Lot 6 Click Score: 16   End of Session Equipment Utilized During Treatment: Oxygen Nurse Communication: Mobility status  Activity Tolerance: Patient limited by fatigue Patient left: in bed;with call bell/phone within reach;with bed alarm set  OT Visit Diagnosis: Other abnormalities of gait and mobility (R26.89);Unsteadiness on feet (R26.81)                Time: 1610-9604 OT Time Calculation (min): 24 min Charges:  OT General Charges $OT Visit: 1 Visit OT Evaluation $OT Eval Moderate Complexity: 1 Mod  Gerre Kraft, OTD OTR/L  01/29/24, 10:51 AM

## 2024-01-29 NOTE — Evaluation (Signed)
 Physical Therapy Evaluation Patient Details Name: Tyler [name] MRN: [id number] DOB: 1946-02-14 Today's Date: 01/29/2024  History of Present Illness  Pt is a 78 year old male Admitted with A/C HFrEF, sepsis and NSTEMI.     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   Clinical Impression  Pt admitted with above diagnosis. Pt currently with functional limitations due to the deficits listed below (see PT Problem List). Pt received upright in bed in chair position agreeable to PT with encouragement. Reports PTA being independent.   To date, pt reports continuous L knee pain and stiffness along with cervical spine pain from laying in bed however needing heavy encouragement to participate. Pt able to mobilize RLE to EOB but needs modA for LLE and at torso with bed features to attain sitting EOB. Mild dizziness reported but this improves with time in sitting. Sat EOB ~5 min for line and lead/chair set up management for attempts for SPT. With bed elevated, pt able to stand minA without RW but upon standing with notable instability reaching for items to stabilize ultimately needing RW to maintain static standing at CGA to supervision. Pt attempts to SPT to recliner but unable to progress RLE due to weight acceptance on LLE due to knee pain. Pt is unable to side step either towards HOB thus assisted to sitting with biasing hips towards HoB and maxA with +2 trials of L lateral scooting and minA at LE's to return to supine. Pt left in care of RN and NT for bathing care. Pt with significant deficits in functional mobility due to deconditioning, weakness, and L knee pain. Pt will benefit from skilled PT services < 3  hours/day to address his deficits and maximize return to PLOF.      If plan is discharge home, recommend the following: Two people to help with walking and/or transfers;A lot of help with bathing/dressing/bathroom;Assist for transportation;Assistance with cooking/housework;Help with stairs or ramp for entrance   Can travel by private vehicle   No    Equipment Recommendations Other (comment) (TBD by next venue of care)  Recommendations for Other Services       Functional Status Assessment Patient has had a recent decline in their functional status and demonstrates the ability to make significant improvements in function in a reasonable and predictable amount of time.     Precautions / Restrictions Precautions Precautions: Fall Recall of Precautions/Restrictions: Intact Restrictions Other Position/Activity Restrictions: per podiatry note on 4/29 "Patient brought his compression socks today.  No multilayer Unna boot compression wrap was applied today.  Wear compression socks daily  -Continue to advise against going barefoot.  Patient currently wearing good supportive tennis shoes and sneakers.  Continue"      Mobility  Bed Mobility Overal bed mobility: Needs Assistance Bed Mobility: Supine to Sit, Sit to Supine     Supine to sit: Mod assist, HOB elevated, Used rails Sit to supine: Min assist   General bed mobility comments: minA on LE's to return to supine Patient Response: Cooperative  Transfers Overall transfer level: Needs assistance Equipment used: Rolling walker (2 wheels) Transfers: Sit to/from Stand Sit to Stand: Min assist, From elevated surface           General transfer comment: Unable to take steps due  to L knee stiffness and pain    Ambulation/Gait               General Gait Details: unable  Stairs            Wheelchair Mobility     Tilt Bed Tilt Bed Patient Response: Cooperative  Modified Rankin (Stroke Patients Only)        Balance Overall balance assessment: Needs assistance Sitting-balance support: Feet supported Sitting balance-Leahy Scale: Good       Standing balance-Leahy Scale: Poor Standing balance comment: extremely unsteady in standing without AD needing RW to stabilize                             Pertinent Vitals/Pain Pain Assessment Pain Assessment: Faces Faces Pain Scale: Hurts little more Pain Location: L knee- reports it feels arthritic Pain Descriptors / Indicators: Grimacing, Discomfort Pain Intervention(s): Monitored during session, Repositioned    Home Living Family/patient expects to be discharged to:: Private residence Living Arrangements: Non-relatives/Friends (roommate) Available Help at Discharge: Friend(s);Available PRN/intermittently Type of Home: Apartment Home Access: Stairs to enter Entrance Stairs-Rails: Left Entrance Stairs-Number of Steps: 13 steps to 2nd floor apt   Home Layout: One level Home Equipment: Grab bars - tub/shower;Rolling Walker (2 wheels);Cane - single point      Prior Function Prior Level of Function : Independent/Modified Independent;Driving;Working/employed             Mobility Comments: amb with no AD, works in [employer] ADLs Comments: MOD I-I in ADL/IADL per pt report     Extremity/Trunk Assessment   Upper Extremity Assessment Upper Extremity Assessment: Overall WFL for tasks assessed    Lower Extremity Assessment Lower Extremity Assessment: Generalized weakness       Communication   Communication Communication: No apparent difficulties    Cognition Arousal: Alert Behavior During Therapy: WFL for tasks assessed/performed, Flat affect                             Following commands: Intact       Cueing Cueing Techniques: Verbal cues     General Comments      Exercises Other Exercises Other Exercises: importance of graded mobility to prevent deconditioning   Assessment/Plan    PT  Assessment Patient needs continued PT services  PT Problem List Decreased strength;Decreased mobility;Decreased activity tolerance;Decreased balance;Pain       PT Treatment Interventions DME instruction;Therapeutic exercise;Gait training;Balance training;Stair training;Neuromuscular re-education;Functional mobility training;Therapeutic activities;Patient/family education    PT Goals (Current goals can be found in the Care Plan section)  Acute Rehab PT Goals Patient Stated Goal: improve knee pain and strength PT Goal Formulation: With patient Time For Goal Achievement: 02/12/24 Potential to Achieve Goals: Good    Frequency Min 3X/week     Co-evaluation               AM-PAC PT "6 Clicks" Mobility  Outcome Measure Help needed turning from your back to your side while in a flat bed without using bedrails?: A Lot Help needed moving from lying on your back to sitting on the side of a flat bed without using bedrails?: A Lot Help needed moving to and from a bed to a chair (including a wheelchair)?: A Lot Help needed standing up from a chair using your arms (e.g., wheelchair or bedside chair)?: A Lot Help needed to walk in hospital room?: Total Help needed  climbing 3-5 steps with a railing? : Total 6 Click Score: 10    End of Session Equipment Utilized During Treatment: Gait belt Activity Tolerance: Patient limited by fatigue Patient left: in bed;with call bell/phone within reach;with nursing/sitter in room Nurse Communication: Mobility status PT Visit Diagnosis: Unsteadiness on feet (R26.81);Muscle weakness (generalized) (M62.81);Difficulty in walking, not elsewhere classified (R26.2)    Time: 1610-9604 PT Time Calculation (min) (ACUTE ONLY): 24 min   Charges:   PT Evaluation $PT Eval Moderate Complexity: 1 Mod PT Treatments $Therapeutic Activity: 8-22 mins PT General Charges $$ ACUTE PT VISIT: 1 Visit        Marc Senior. Fairly IV, PT, DPT Physical Therapist- [facility]  [facility]  01/29/2024, 3:42 PM

## 2024-01-29 NOTE — Assessment & Plan Note (Signed)
 Pressure Injury 01/27/24 Heel Right Unstageable - Full thickness tissue loss in which the base of the injury is covered by slough (yellow, tan, gray, green or brown) and/or eschar (tan, brown or black) in the wound bed. (Active)  01/27/24 2000  Location: Heel  Location Orientation: Right  Staging: Unstageable - Full thickness tissue loss in which the base of the injury is covered by slough (yellow, tan, gray, green or brown) and/or eschar (tan, brown or black) in the wound bed.  Wound Description (Comments):   Present on Admission: Yes     Pressure Injury 01/27/24 Heel Right Stage 2 -  Partial thickness loss of dermis presenting as a shallow open injury with a red, pink wound bed without slough. (Active)  01/27/24 2000  Location: Heel  Location Orientation: Right  Staging: Stage 2 -  Partial thickness loss of dermis presenting as a shallow open injury with a red, pink wound bed without slough.  Wound Description (Comments):   Present on Admission: Yes     Pressure Injury 01/27/24 Heel Left Stage 2 -  Partial thickness loss of dermis presenting as a shallow open injury with a red, pink wound bed without slough. (Active)  01/27/24 2000  Location: Heel  Location Orientation: Left  Staging: Stage 2 -  Partial thickness loss of dermis presenting as a shallow open injury with a red, pink wound bed without slough.  Wound Description (Comments):   Present on Admission: Yes

## 2024-01-30 ENCOUNTER — Encounter: Payer: Self-pay | Admitting: Certified Registered"

## 2024-01-30 ENCOUNTER — Inpatient Hospital Stay

## 2024-01-30 DIAGNOSIS — S81809A Unspecified open wound, unspecified lower leg, initial encounter: Secondary | ICD-10-CM

## 2024-01-30 DIAGNOSIS — M25462 Effusion, left knee: Secondary | ICD-10-CM | POA: Diagnosis not present

## 2024-01-30 DIAGNOSIS — I5020 Unspecified systolic (congestive) heart failure: Secondary | ICD-10-CM

## 2024-01-30 DIAGNOSIS — R7881 Bacteremia: Secondary | ICD-10-CM | POA: Diagnosis not present

## 2024-01-30 DIAGNOSIS — A401 Sepsis due to streptococcus, group B: Secondary | ICD-10-CM | POA: Diagnosis not present

## 2024-01-30 DIAGNOSIS — I5023 Acute on chronic systolic (congestive) heart failure: Secondary | ICD-10-CM | POA: Diagnosis not present

## 2024-01-30 DIAGNOSIS — J9611 Chronic respiratory failure with hypoxia: Secondary | ICD-10-CM

## 2024-01-30 DIAGNOSIS — I442 Atrioventricular block, complete: Secondary | ICD-10-CM

## 2024-01-30 DIAGNOSIS — L0390 Cellulitis, unspecified: Secondary | ICD-10-CM

## 2024-01-30 DIAGNOSIS — N179 Acute kidney failure, unspecified: Secondary | ICD-10-CM | POA: Diagnosis not present

## 2024-01-30 DIAGNOSIS — Z950 Presence of cardiac pacemaker: Secondary | ICD-10-CM

## 2024-01-30 DIAGNOSIS — I214 Non-ST elevation (NSTEMI) myocardial infarction: Secondary | ICD-10-CM | POA: Diagnosis not present

## 2024-01-30 DIAGNOSIS — B951 Streptococcus, group B, as the cause of diseases classified elsewhere: Secondary | ICD-10-CM | POA: Diagnosis not present

## 2024-01-30 LAB — CULTURE, BLOOD (ROUTINE X 2)
Special Requests: ADEQUATE
Special Requests: ADEQUATE

## 2024-01-30 LAB — SYNOVIAL CELL COUNT + DIFF, W/ CRYSTALS
Eosinophils-Synovial: 0 % (ref 0–1)
Lymphocytes-Synovial Fld: 1 % (ref 0–20)
Monocyte-Macrophage-Synovial Fluid: 6 % — ABNORMAL LOW (ref 50–90)
Neutrophil, Synovial: 93 % — ABNORMAL HIGH (ref 0–25)
WBC, Synovial: 24791 /mm3 — ABNORMAL HIGH (ref 0–200)

## 2024-01-30 LAB — CBC
HCT: 30.4 % — ABNORMAL LOW (ref 39.0–52.0)
Hemoglobin: 9.7 g/dL — ABNORMAL LOW (ref 13.0–17.0)
MCH: 27.5 pg (ref 26.0–34.0)
MCHC: 31.9 g/dL (ref 30.0–36.0)
MCV: 86.1 fL (ref 80.0–100.0)
Platelets: 123 10*3/uL — ABNORMAL LOW (ref 150–400)
RBC: 3.53 MIL/uL — ABNORMAL LOW (ref 4.22–5.81)
RDW: 16.7 % — ABNORMAL HIGH (ref 11.5–15.5)
WBC: 8.3 10*3/uL (ref 4.0–10.5)
nRBC: 0 % (ref 0.0–0.2)

## 2024-01-30 LAB — HEPARIN LEVEL (UNFRACTIONATED): Heparin Unfractionated: 0.44 [IU]/mL (ref 0.30–0.70)

## 2024-01-30 LAB — BASIC METABOLIC PANEL WITH GFR
Anion gap: 12 (ref 5–15)
BUN: 98 mg/dL — ABNORMAL HIGH (ref 8–23)
CO2: 22 mmol/L (ref 22–32)
Calcium: 8.5 mg/dL — ABNORMAL LOW (ref 8.9–10.3)
Chloride: 102 mmol/L (ref 98–111)
Creatinine, Ser: 2.44 mg/dL — ABNORMAL HIGH (ref 0.61–1.24)
GFR, Estimated: 26 mL/min — ABNORMAL LOW (ref >60)
Glucose, Bld: 176 mg/dL — ABNORMAL HIGH (ref 70–99)
Potassium: 3.3 mmol/L — ABNORMAL LOW (ref 3.5–5.1)
Sodium: 136 mmol/L (ref 135–145)

## 2024-01-30 LAB — GLUCOSE, CAPILLARY
Glucose-Capillary: 195 mg/dL — ABNORMAL HIGH (ref 70–99)
Glucose-Capillary: 224 mg/dL — ABNORMAL HIGH (ref 70–99)
Glucose-Capillary: 188 mg/dL — ABNORMAL HIGH (ref 70–99)
Glucose-Capillary: 201 mg/dL — ABNORMAL HIGH (ref 70–99)

## 2024-01-30 LAB — MAGNESIUM: Magnesium: 2.4 mg/dL (ref 1.7–2.4)

## 2024-01-30 MED ORDER — METOLAZONE 2.5 MG PO TABS
2.5000 mg | ORAL_TABLET | Freq: Once | ORAL | Status: AC
  Administered 2024-01-30: 2.5 mg via ORAL
  Filled 2024-01-30: qty 1

## 2024-01-30 MED ORDER — POTASSIUM CHLORIDE CRYS ER 20 MEQ PO TBCR: 20.0000 meq | EXTENDED_RELEASE_TABLET | Freq: Once | ORAL | Status: DC

## 2024-01-30 MED ORDER — POTASSIUM CHLORIDE CRYS ER 20 MEQ PO TBCR
40.0000 meq | EXTENDED_RELEASE_TABLET | Freq: Once | ORAL | Status: AC
  Administered 2024-01-30: 40 meq via ORAL
  Filled 2024-01-30: qty 2

## 2024-01-30 MED ORDER — FUROSEMIDE 10 MG/ML IJ SOLN
160.0000 mg | Freq: Once | INTRAVENOUS | Status: AC
  Administered 2024-01-30: 160 mg via INTRAVENOUS
  Filled 2024-01-30: qty 10

## 2024-01-30 MED ORDER — ACETAZOLAMIDE 250 MG PO TABS
250.0000 mg | ORAL_TABLET | Freq: Once | ORAL | Status: AC
  Administered 2024-01-30: 250 mg via ORAL
  Filled 2024-01-30: qty 1

## 2024-01-30 MED ORDER — FUROSEMIDE 10 MG/ML IJ SOLN
160.0000 mg | Freq: Two times a day (BID) | INTRAVENOUS | Status: DC
  Administered 2024-01-30: 160 mg via INTRAVENOUS
  Filled 2024-01-30 (×2): qty 16

## 2024-01-30 MED ORDER — LIDOCAINE HCL 1 % IJ SOLN
20.0000 mL | INTRAMUSCULAR | Status: AC
  Administered 2024-01-30: 4 mL
  Filled 2024-01-30: qty 20

## 2024-01-30 MED ORDER — POTASSIUM CHLORIDE CRYS ER 20 MEQ PO TBCR: 40.0000 meq | EXTENDED_RELEASE_TABLET | Freq: Once | ORAL | Status: DC

## 2024-01-30 NOTE — Progress Notes (Signed)
   ID requesting ortho consult for left knee arthrocentesis to r/o septic knee. Dr. Clyda Dark consulted and will perform bedside arthrocentesis. Ortho ok with IV heparin  running while performing arthrocentesis.  Unk Garb, DO [facility]

## 2024-01-30 NOTE — TOC Progression Note (Signed)
 Transition of Care [facility]) - Progression Note    Patient Details  Name: Tyler [name] MRN: [id number] Date of Birth: 17-Jun-1946  [facility] of [facility]) CM/SW Contact  Odilia Bennett, LCSW Phone Number: 01/30/2024, 10:31 AM  Clinical Narrative:   CSW met with patient. No family at bedside. Patient is not interested in SNF or home health. CSW mentioned inability to walk yesterday but he said it was due to the pain in his leg which is better today. CSW sent secure chat to PT to notify.  Expected Discharge Plan: Home/Self Care Barriers to Discharge: Continued Medical Work up  Expected Discharge Plan and Services     Post Acute Care Choice: NA Living arrangements for the past 2 months: Single Family Home                                       Social Determinants of Health (SDOH) Interventions SDOH Screenings   Food Insecurity: No Food Insecurity (01/27/2024)  Housing: Low Risk  (01/27/2024)  Transportation Needs: No Transportation Needs (01/27/2024)  Utilities: Not At Risk (01/27/2024)  Alcohol Screen: Low Risk  (08/14/2023)  Financial Resource Strain: Low Risk  (03/20/2023)   Received from [facility], [facility]  Social Connections: Socially Isolated (01/27/2024)  Stress: No Stress Concern Present (09/07/2020)   Received from [facility], [facility]  Tobacco Use: Low Risk  (01/26/2024)    Readmission Risk Interventions    01/29/2024   12:58 PM 09/12/2023   12:13 PM 08/15/2023    3:57 PM  Readmission Risk Prevention Plan  Transportation Screening Complete Complete Complete  PCP or Specialist Appt within 3-5 Days  Complete Complete  HRI or Home Care Consult  Complete Complete  Social Work Consult for Recovery Care Planning/Counseling  Complete Complete  Palliative Care Screening  Not Applicable Not Applicable  Medication Review Oceanographer) Complete Complete Complete  PCP or Specialist appointment within 3-5 days of discharge Complete    SW Recovery  Care/Counseling Consult Complete    Palliative Care Screening Not Applicable    Skilled Nursing Facility Not Applicable

## 2024-01-30 NOTE — Progress Notes (Signed)
 ANTICOAGULATION CONSULT NOTE  Pharmacy Consult for heparin  infusion Indication: ACS/STEMI  Allergies  Allergen Reactions   Quinolones Other (See Comments)    Patient has aortic aneurysm - fluoroquinolones are contraindicated due to risk of aortic rupture.   Eliquis  [Apixaban ]     Dizziness  and vision change   Spironolactone  Other (See Comments)    dizziness    Patient Measurements: Height: 6\' 6"  (198.1 cm) Weight: 107.5 kg (236 lb 15.9 oz) IBW/kg (Calculated) : 91.4 Heparin  Dosing Weight: 115.5 kg  Vital Signs: Temp: 98.6 F (37 C) (05/07 1940) Temp Source: Oral (05/07 1940) BP: 129/65 (05/07 1940) Pulse Rate: 95 (05/07 1940)  Labs: Recent Labs    01/27/24 1826 01/27/24 2043 01/28/24 0011 01/28/24 0401 01/28/24 1340 01/28/24 2256 01/29/24 0701 01/30/24 0248  HGB   < >  --   --  10.3*  --   --  10.4* 9.7*  HCT  --   --   --  32.6*  --   --  32.4* 30.4*  PLT  --   --   --  117*  --   --  120* 123*  APTT  --  72*  --   --   --   --   --   --   LABPROT  --  18.2*  --   --   --   --   --   --   INR  --  1.5*  --   --   --   --   --   --   HEPARINUNFRC  --   --   --  0.20*   < > 0.41 0.45 0.44  CREATININE  --  1.82*  --  2.05*  --   --  2.33* 2.44*  TROPONINIHS  --  1,336* 1,830* 1,804*  --   --   --   --    < > = values in this interval not displayed.    Estimated Creatinine Clearance: 31.7 mL/min (A) (by C-G formula based on SCr of 2.44 mg/dL (H)).   Medical History: Past Medical History:  Diagnosis Date   (HFpEF) heart failure with preserved ejection fraction (HCC) 03/01/2020   a.) TTE 03/01/2020: EF 55-60%, mod MAC, mod AoV sclerosis, triv AR, mild TR, mod MR, RVSP 50-59; b.) TTE 09/10/2020: EF 55-60%, mod MAC, mod AoV sclerosis, mild TR, 3+ MR, RVSP 50-59; c.) TTE 09/26/2020: EF 55-60%, mild LA dil, triv PR, mild MR/TR, RVSP 37-49   Adenoma of left adrenal gland    Anemia    Arthritis    Atrial fibrillation and flutter (HCC)    a.) CHA2DS2VASc = 5 (age  x2, HFpEF, HTN, T2DM);  b.) s/p CTI ablation 09/07/2020; c.) rate/rhythm maintained on oral carvedilol ; not on chronic anticoagulation therapy   CAD (coronary artery disease)    Cardiomegaly    CKD (chronic kidney disease), stage III (HCC)    DOE (dyspnea on exertion)    Drug-induced bradycardia    Gangrene of toe of left foot (HCC)    a.) 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   H/O active rheumatic fever 08/22/2020   Hepatosplenomegaly    History of bilateral cataract extraction    HLD (hyperlipidemia)    Hypertension    Long term current use of aspirin     Lymphedema of both lower extremities    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)    a.) s/p amputation 11/04/2022   Peripheral vascular disease (HCC)  Pleural effusion on right 09/09/2020   a.) s/p RIGHT thoracentesis with 2180 cc yield   Pneumonia    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   Sick sinus syndrome [facility])    a.) s/p MDT PPM placement 09/15/2020   T2DM (type 2 diabetes mellitus) (HCC)    Urinary retention    chronic, with indwelling Foley catheter and plans for a suprapubic   Wears dentures    full upper    Assessment: Pt is a 78 yo male presenting to ED complaining of feeling of dizziness and confusion. Peak trop of 1.8K. No DOAC PTA. PMH: HFpEF, OA, afib/flutter, pacemaker, CAD, CKD 3, HLD, HTN, PAD, DM.   5/5 0844 HL 0.27    SUBtherapeutic 5/5 1826 HL 0.39    Therapeutic x 1 5/6  0401 HL 0.20    SUBtherapeutic  5/6 1340 HL 0.24    SUBtherapeutic 5/6 2256 HL 0.41    Therapeutic x 1  5/7 0701 HL 0.45    Therapeutic x 2 5/8 0248 HL 0.44    Therapeutic X 3   No bleeding noted. Hgb stable today at 10.4 and plt count of 120K. Of note, patient has previously refused oral anticoagulants.    Goal of Therapy:  Heparin  level 0.3-0.7 units/ml Monitor platelets by anticoagulation protocol: Yes   Plan:  5/8:  HL @ 0248 = 0.44, therapeutic X 3 - Will continue pt on current rate and recheck HL on 5/9 with AM labs.  - CBC daily  Garmon Dehn D, PharmD Clinical Pharmacist 01/30/2024

## 2024-01-30 NOTE — Plan of Care (Signed)
  Problem: Education: Goal: Ability to describe self-care measures that may prevent or decrease complications (Diabetes Survival Skills Education) will improve Outcome: Progressing Goal: Individualized Educational Video(s) Outcome: Progressing   Problem: Coping: Goal: Ability to adjust to condition or change in health will improve Outcome: Progressing   Problem: Fluid Volume: Goal: Ability to maintain a balanced intake and output will improve Outcome: Progressing   Problem: Health Behavior/Discharge Planning: Goal: Ability to identify and utilize available resources and services will improve Outcome: Progressing Goal: Ability to manage health-related needs will improve Outcome: Progressing   Problem: Metabolic: Goal: Ability to maintain appropriate glucose levels will improve Outcome: Progressing   Problem: Nutritional: Goal: Maintenance of adequate nutrition will improve Outcome: Progressing Goal: Progress toward achieving an optimal weight will improve Outcome: Progressing   Problem: Skin Integrity: Goal: Risk for impaired skin integrity will decrease Outcome: Progressing   Problem: Tissue Perfusion: Goal: Adequacy of tissue perfusion will improve Outcome: Progressing   Problem: Education: Goal: Knowledge of General Education information will improve Description: Including pain rating scale, medication(s)/side effects and non-pharmacologic comfort measures Outcome: Progressing   Problem: Health Behavior/Discharge Planning: Goal: Ability to manage health-related needs will improve Outcome: Progressing   Problem: Clinical Measurements: Goal: Ability to maintain clinical measurements within normal limits will improve Outcome: Progressing Goal: Will remain free from infection Outcome: Progressing Goal: Diagnostic test results will improve Outcome: Progressing Goal: Respiratory complications will improve Outcome: Progressing Goal: Cardiovascular complication will  be avoided Outcome: Progressing   Problem: Activity: Goal: Risk for activity intolerance will decrease Outcome: Progressing   Problem: Nutrition: Goal: Adequate nutrition will be maintained Outcome: Progressing   Problem: Coping: Goal: Level of anxiety will decrease Outcome: Progressing   Problem: Elimination: Goal: Will not experience complications related to bowel motility Outcome: Progressing Goal: Will not experience complications related to urinary retention Outcome: Progressing   Problem: Pain Managment: Goal: General experience of comfort will improve and/or be controlled Outcome: Progressing   Problem: Safety: Goal: Ability to remain free from injury will improve Outcome: Progressing   Problem: Skin Integrity: Goal: Risk for impaired skin integrity will decrease Outcome: Progressing   Problem: Fluid Volume: Goal: Hemodynamic stability will improve Outcome: Progressing   Problem: Clinical Measurements: Goal: Diagnostic test results will improve Outcome: Progressing Goal: Signs and symptoms of infection will decrease Outcome: Progressing   Problem: Respiratory: Goal: Ability to maintain adequate ventilation will improve Outcome: Progressing

## 2024-01-30 NOTE — Progress Notes (Signed)
 Bedside procedure with Dr Clyda Dark. Left knee arthrocentesis for possible septic arthritis. Consent signed. Time out performed at 1555. See new orders for labs and post procedure xray. Pt tolerated well. Plan for NPO after midnight for OR in the morning.

## 2024-01-30 NOTE — Progress Notes (Addendum)
 Advanced Heart Failure Rounding Note  Cardiologist: None  Chief Complaint: Heart failure, NSTEMI Subjective:    Better response to diuretics yesterday.  -2.3L UOP. SCr relatively stable 2.33>2.44.  WBC WNL. Afebrile.   Blood Cx with agalactiae bacteremia. On rocephin . Repeat blood cultures 8/7 NTGD.  Continues to feel better. Pain in BLE improving as well. Denies CP/SOB.   Objective:   Weight Range: 107.3 kg Body mass index is 27.34 kg/m.   Vital Signs:   Temp:  [97.8 F (36.6 C)-98.6 F (37 C)] 97.8 F (36.6 C) (05/08 0300) Pulse Rate:  [68-95] 95 (05/07 1940) Resp:  [22-27] 22 (05/07 1940) BP: (93-129)/(55-79) 129/65 (05/07 1940) SpO2:  [98 %-99 %] 99 % (05/07 1940) Weight:  [107.3 kg] 107.3 kg (05/08 0518) Last BM Date : 01/26/24  Weight change: Filed Weights   01/26/24 1926 01/29/24 0530 01/30/24 0518  Weight: 118.3 kg 107.5 kg 107.3 kg    Intake/Output:   Intake/Output Summary (Last 24 hours) at 01/30/2024 0802 Last data filed at 01/30/2024 0531 Gross per 24 hour  Intake 2130.34 ml  Output 2325 ml  Net -194.66 ml    Physical Exam  General:  elderly appearing.  No respiratory difficulty Neck: supple. JVD ~11 cm.  Cor: PMI nondisplaced. Regular rate & irregular rhythm. No rubs, gallops or murmurs. Lungs: clear Extremities: no cyanosis, clubbing, rash, +2 BLE edema to thigh. BLE erythema Neuro: alert & oriented x 3. Moves all 4 extremities w/o difficulty. Affect pleasant.   Telemetry   A fib 80s, intt paced, intt PVCs (Personally reviewed)    EKG    No new EKG to review  Labs    CBC Recent Labs    01/29/24 0701 01/30/24 0248  WBC 9.4 8.3  HGB 10.4* 9.7*  HCT 32.4* 30.4*  MCV 85.9 86.1  PLT 120* 123*   Basic Metabolic Panel Recent Labs    78/46/96 0701 01/30/24 0248  NA 136 136  K 3.9 3.3*  CL 103 102  CO2 20* 22  GLUCOSE 160* 176*  BUN 86* 98*  CREATININE 2.33* 2.44*  CALCIUM  8.3* 8.5*  MG  --  2.4   Liver Function  Tests Recent Labs    01/27/24 2043  AST 28  ALT 22  ALKPHOS 58  BILITOT 1.2  PROT 6.9  ALBUMIN 3.2*   No results for input(s): "LIPASE", "AMYLASE" in the last 72 hours. Cardiac Enzymes No results for input(s): "CKTOTAL", "CKMB", "CKMBINDEX", "TROPONINI" in the last 72 hours.  BNP: BNP (last 3 results) Recent Labs    09/11/23 1627 10/28/23 1107 01/27/24 0844  BNP 3,874.2* 754.7* 4,043.5*    ProBNP (last 3 results) Recent Labs    10/02/23 1111  PROBNP 3,757*     D-Dimer No results for input(s): "DDIMER" in the last 72 hours. Hemoglobin A1C No results for input(s): "HGBA1C" in the last 72 hours.  Fasting Lipid Panel No results for input(s): "CHOL", "HDL", "LDLCALC", "TRIG", "CHOLHDL", "LDLDIRECT" in the last 72 hours. Thyroid Function Tests No results for input(s): "TSH", "T4TOTAL", "T3FREE", "THYROIDAB" in the last 72 hours.  Invalid input(s): "FREET3"  Other results:   Imaging    US  EKG SITE RITE Result Date: 01/29/2024 If Site Rite image not attached, placement could not be confirmed due to current cardiac rhythm.    Medications:   Scheduled Medications:  acetaZOLAMIDE  250 mg Oral Once   vitamin C  500 mg Oral BID   aspirin  EC  81 mg Oral Daily  atorvastatin   80 mg Oral q1800   Chlorhexidine  Gluconate Cloth  6 each Topical Daily   clopidogrel   75 mg Oral Q breakfast   feeding supplement (NEPRO CARB STEADY)  237 mL Oral BID BM   gentamicin  cream  1 Application Topical BID   insulin  aspart  0-15 Units Subcutaneous TID WC   insulin  aspart  0-5 Units Subcutaneous QHS   metolazone   2.5 mg Oral Once   multivitamin with minerals  1 tablet Oral Daily   potassium chloride   40 mEq Oral Once   silver  sulfADIAZINE    Topical Daily    Infusions:  cefTRIAXone  (ROCEPHIN )  IV Stopped (01/29/24 1439)   furosemide      heparin  2,300 Units/hr (01/30/24 0531)    PRN Medications: acetaminophen  **OR** acetaminophen , magnesium  hydroxide, nitroGLYCERIN ,  ondansetron  **OR** ondansetron  (ZOFRAN ) IV, phenylephrine , traZODone   Patient Profile   Tyler [name] is a 78 y/o male with a history of persistent atrial fibrillation 06/21 (not on anticoag), HTN, T2DM, CKD, cellulitis, hyperlipidemia, 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 Admitted with A/C HFrEF, sepsis and NSTEMI.   Assessment/Plan   Acute on chronic HFrEF - Echo 07/03/23: EF 30-35% with moderate Tyler - NYHA IV on admission. Volume overloaded on exam 2/2 hx medication noncompliance, PAF, NSTEMI.  - Good response to lasix  160 IV BID yesterday + 2.5 metolazone . -2.3L UOP. Weight unchanged? Will repeat today. Add diamox 250 x1. Follow response. Plan to hold off on PICC placement for now. Repeat blood cultures done yesterday with no growth so far. Will check with ID about PICC line placement. Renal function relatively stable today.  - Hold SGLT2i with AKI - No MRA with hx of intolerance. Can consider eplerenone if he is open to trying once renal function improves - Hold BB with volume overload and acute decompensation - Avoid hypotension. No ARB/ARNi for now.  - Lactic acid cleared. 2.3>1.5>1.4 - Will need L/RHC once renal function stabilizes.  - Place compression hose (had on yesterday). Strict I&O, daily weights   NSTEMI - Denies new chest pain. Admitted with persistent 5/10 chest pain, has had it for years. - HsTrop 69>105>326>714>1336>1830>1804 - Continue heparin  gtt, ASA, Plavix  - Would need LHC when SCr improves - Denies CP today.    Sepsis 2/2 strep agalactiae bacteremia - Continue Rocephin   - UA with UTI with chronic foley - Will need TEE to r/o vegetation/endocarditis. Friday vs early next week. - Consult EP today  - febrile to 102.9 5/5. WBC improving 18.2>21.9>13> wnl. Now afebrile.  - Repeat  blood cultures drawn yesterday. NG <12hrs.    Persistent atrial fibrillation - Rate controlled. A fib in 80s-90s - Continue ASA and heparin  gtt - On Plavix . Allergy to Eliquis  with dizziness and vision changes   AV block s/p MDT PPM  - Has not been compliant with EP follow ups.  - Device interrogated per RN but not printed. Unable to view.    AKI on CKD IIIa - Baseline SCr ~1.2-1.5 - SCr 2.05>2.33>2.44 today - Avoid hypotension  Pain in BLE improving. Hopefully he can work with PT today. Ordered incentive spirometer for him.   Length of Stay: 2  Sheryl Donna, NP  01/30/2024, 8:02 AM  Advanced Heart Failure Team Pager [phone] (M-F; 7a - 5p)  Please contact [facility] for night-coverage after hours (5p -7a ) and weekends on  [email or web address]    Patient seen and examined with the above-signed Advanced Practice Provider and/or Housestaff. I personally reviewed laboratory data, imaging studies and relevant notes. I independently examined the patient and formulated the important aspects of the plan. I have edited the note to reflect any of my changes or salient points. I have personally discussed the plan with the patient and/or family.  Diuresis picking up. Breathing better. Scr relatively stable. No f/c. Denies orthopnea or PND  General:  Lying in bed No resp difficulty HEENT: normal Neck: supple. JVP ear . Irregular rate & rhythm. No rubs, gallops or murmurs. Lungs: clear Abdomen: soft, nontender, + distended. Good bowel sounds. Extremities: no cyanosis, clubbing, rash, 2-3+ edema Neuro: alert & orientedx3, cranial nerves grossly intact. moves all 4 extremities w/o difficulty. Affect pleasant  Diuresis picking up. Continue IV diuresis. Watch renal function closely. Can add inotropic support as needed.   Once more diuresed will need TEE (agree with Dr. Mardel Shad plan). Eventual ischemic w/u but not cath candidate currently with AKI.  EP and ID following.   Jules Oar, MD   3:58 PM

## 2024-01-30 NOTE — Consult Note (Signed)
 ELECTROPHYSIOLOGY CONSULT NOTE    Patient ID: Tyler [name] MRN: [id number], DOB/AGE: 12-15-1945 78 y.o.  Admit date: 01/26/2024 Date of Consult: 01/30/2024  Primary Physician: McClanahan, Kyra, NP Primary Cardiologist: Janette Medley, MD  Electrophysiologist: Dr. Zackary Heron at [facility]   Referring Provider: Dr. Bob Burn  Patient Profile: Tyler [name] is a 78 y.o. male with a history of persis AFib, CHB s/p PPM, newly daig HFrEF, HTN, recurrent cellulitis, urinary retention w chronic indwelling foley, CKD, osteomyelitis, T2DM, chronic diabetic foot ulcers who is being seen today for the evaluation of bacteremia with the presence of implanted cardiac device at the request of Dr. Bob Burn.  HPI:  Tyler [name] is a 78 y.o. male with PMH as above who presented to [facility] 5/4 with severe LE edema, SOB, fatigue.   Dual Chamber PPM implanted 08/2020 by Dr. Zackary Heron at [place].   He has been admitted multiple times in the 2 years with infections of lower extremities. He presented to [facility] 5/4 with worsening SOB, lower extremity edema and 5/10 chest pain that is his baseline. Admission labs with elevated troponin, EKG with AFib w RVR, BNP 4k, WBC 18.2, Lactic acid 2.3.  He became acutely febrile, tachypneic, tachycardic. Blood cultures positive for strep agalactiae.   HF is on board and assisting with diuresis, HFrEF, AKI mgmt.   Currently, he continues to have productive cough and lower extrmeity edema, but states that he feels better than he did when he presented to ER. He believes that a lack of PO abx several weeks ago has caused his current bacteremia, was prescribed by podiatry but pharmacy never filled it.  He states he is able to complete all ADLs at home, and take care of house independently. He denies chest pain, chest pressure, palpiations.      Labs Potassium3.3* (05/08 0248) Magnesium   2.4 (05/08 0248) Creatinine, ser  2.44* (05/08 0248) PLT  123* (05/08 0248) HGB  9.7* (05/08  0248) WBC 8.3 (05/08 0248) Troponin I (High Sensitivity)1,804* (05/06 0401).    Past Medical History:  Diagnosis Date   (HFpEF) heart failure with preserved ejection fraction (HCC) 03/01/2020   a.) TTE 03/01/2020: EF 55-60%, mod MAC, mod AoV sclerosis, triv AR, mild TR, mod MR, RVSP 50-59; b.) TTE 09/10/2020: EF 55-60%, mod MAC, mod AoV sclerosis, mild TR, 3+ MR, RVSP 50-59; c.) TTE 09/26/2020: EF 55-60%, mild LA dil, triv PR, mild MR/TR, RVSP 37-49   Adenoma of left adrenal gland    Anemia    Arthritis    Atrial fibrillation and flutter (HCC)    a.) CHA2DS2VASc = 5 (age x2, HFpEF, HTN, T2DM);  b.) s/p CTI ablation 09/07/2020; c.) rate/rhythm maintained on oral carvedilol ; not on chronic anticoagulation therapy   CAD (coronary artery disease)    Cardiomegaly    CKD (chronic kidney disease), stage III (HCC)    DOE (dyspnea on exertion)    Drug-induced bradycardia    Gangrene of toe of left foot (HCC)    a.) 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   H/O active rheumatic fever 08/22/2020   Hepatosplenomegaly    History of bilateral cataract extraction    HLD (hyperlipidemia)    Hypertension    Long term current use of aspirin     Lymphedema of both lower extremities    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)    a.) s/p amputation 11/04/2022   Peripheral vascular disease (HCC)    Pleural effusion on right 09/09/2020  a.) s/p RIGHT thoracentesis with 2180 cc yield   Pneumonia    Presence of permanent cardiac pacemaker 09/10/2020   a.) TVP placement 09/10/2020 due to intermittent CHB in setting of urosepsis; b.) s/p PPM placement 09/15/2020: MDT Azure XT DR (SN: [id number])   Pulmonary hypertension (HCC) 03/01/2020   a.) TTE: 03/01/2020: RVSP 50-59; b.) TTE 09/10/2020: RVSP 50-59; c.) TTE 09/26/2020: RVSP 37-49   RA (rheumatoid arthritis) (HCC)    Rheumatic fever    Sepsis (HCC) 09/10/2020   a.) urosepsis --> BC x 2 sets and UC all grew out significant Proteus mirabilis;  admitted to [facility] 09/07/2020 - 09/27/2020.   Sick sinus syndrome [facility])    a.) s/p MDT PPM placement 09/15/2020   T2DM (type 2 diabetes mellitus) (HCC)    Urinary retention    chronic, with indwelling Foley catheter and plans for a suprapubic   Wears dentures    full upper     Surgical History:  Past Surgical History:  Procedure Laterality Date   AMPUTATION TOE Right 11/04/2022   Procedure: AMPUTATION TOE;  Surgeon: Dot Gazella, [name];  [name];  Service: Podiatry;  Laterality: Right;   AMPUTATION TOE Right 09/15/2023   Procedure: AMPUTATION TOE;  Surgeon: Jennefer Moats, [name];  [name];  Service: Orthopedics/Podiatry;  Laterality: Right;  Right hallux amputation   BONE BIOPSY Right 04/05/2023   Procedure: BONE BIOPSY THIRD & FOURTH;  Surgeon: Dot Gazella, [name];  [name];  Service: Podiatry;  Laterality: Right;   CARDIAC ELECTROPHYSIOLOGY STUDY AND ABLATION N/A 09/07/2020   Procedure: CARDIAC EP STUDY AND ABLATION (CTI)   CATARACT EXTRACTION W/PHACO Right 01/16/2017   Procedure: CATARACT EXTRACTION PHACO AND INTRAOCULAR LENS PLACEMENT (IOC)  Right Complicated;  Surgeon: Annell Kidney, MD;  Location: [facility];  Service: Ophthalmology;  Laterality: Right;  IVA Block Healon 5 malyugin vision blue Diabetic - oral meds   CATARACT EXTRACTION W/PHACO Left 10/23/2022   Procedure: CATARACT EXTRACTION PHACO AND INTRAOCULAR LENS PLACEMENT (IOC) LEFT DIABETIC;  Surgeon: Clair Crews, MD;  Location: [facility];  Service: Ophthalmology;  Laterality: Left;  Diabetic   COLONOSCOPY     INCISION AND DRAINAGE OF WOUND Left 09/15/2023   Procedure: IRRIGATION AND DEBRIDEMENT WOUND;  Surgeon: Jennefer Moats, [name];  [name];  Service: Orthopedics/Podiatry;  Laterality: Left;  Left foot wound debridement, possible graft/biopsy   LOWER EXTREMITY ANGIOGRAPHY Right 11/02/2022   Procedure: Lower Extremity Angiography;   Surgeon: Celso College, MD;  Location: [facility];  Service: Cardiovascular;  Laterality: Right;   LOWER EXTREMITY ANGIOGRAPHY Right 09/13/2023   Procedure: Lower Extremity Angiography;  Surgeon: Jackquelyn Mass, MD;  Location: [facility];  Service: Cardiovascular;  Laterality: Right;   METATARSAL HEAD EXCISION Left 07/05/2018   Procedure: RESECTION FIRST METATARSAL INFECTED BONE AND SOFT TISSUE;  Surgeon: Sharlyn Deaner, [name];  [name];  Service: Podiatry;  Laterality: Left;   METATARSAL HEAD EXCISION Right 04/05/2023   Procedure: METATARSAL HEAD EXCISION THIRD & FOURTH;  Surgeon: Dot Gazella, [name];  [name];  Service: Podiatry;  Laterality: Right;   PACEMAKER INSERTION  09/15/2020   TOE AMPUTATION Left    TONSILLECTOMY       Medications Prior to Admission  Medication Sig Dispense Refill Last Dose/Taking   acetaminophen  (TYLENOL ) 500 MG tablet Take 2 tablets (1,000 mg total) by mouth 3 (three) times daily as needed (for pain).   Taking As Needed  aspirin  EC 81 MG tablet Take 1 tablet (81 mg total) by mouth daily. Swallow whole. 30 tablet 0 01/25/2024   atorvastatin  (LIPITOR) 10 MG tablet Take 1 tablet (10 mg total) by mouth daily at 6 PM. 30 tablet 11 Taking   bumetanide  (BUMEX ) 2 MG tablet Take 1 tablet (2 mg total) by mouth daily. 90 tablet 3 Past Week   clopidogrel  (PLAVIX ) 75 MG tablet Take 1 tablet (75 mg total) by mouth daily with breakfast. 30 tablet 11 01/25/2024 Morning   Finerenone  (KERENDIA ) 10 MG TABS Take 1 tablet (10 mg total) by mouth daily. 30 tablet 5 Past Week   furosemide  (LASIX ) 40 MG tablet Take 40 mg by mouth 2 (two) times daily.   01/25/2024   gentamicin  cream (GARAMYCIN ) 0.1 % Apply 1 Application topically 2 (two) times daily. 30 g 1 Taking   glipiZIDE  (GLUCOTROL ) 10 MG tablet Take 1 tablet (10 mg total) by mouth 2 (two) times daily. 60 tablet 2 Past Month   losartan  (COZAAR ) 25 MG tablet Take 25 mg by mouth daily.   Taking    metolazone  (ZAROXOLYN ) 2.5 MG tablet Take 1 tablet (2.5 mg total) by mouth 2 (two) times a week. On Mondays & Fridays   Past Month   Multiple Vitamins-Minerals (MULTIVITAMIN WITH MINERALS) tablet Take 1 tablet by mouth daily.   01/25/2024   silver  sulfADIAZINE  (SILVADENE ) 1 % cream Apply to affected area daily 50 g 1 Taking   doxycycline  (VIBRA -TABS) 100 MG tablet Take 1 tablet (100 mg total) by mouth 2 (two) times daily. (Patient not taking: Reported on 01/26/2024) 20 tablet 0 Not Taking   metFORMIN  (GLUCOPHAGE ) 1000 MG tablet Take 1 tablet (1,000 mg total) by mouth 2 (two) times daily. (Patient not taking: Reported on 01/26/2024) 60 tablet 2 Not Taking    Inpatient Medications:   acetaZOLAMIDE  250 mg Oral Once   vitamin C  500 mg Oral BID   aspirin  EC  81 mg Oral Daily   atorvastatin   80 mg Oral q1800   Chlorhexidine  Gluconate Cloth  6 each Topical Daily   clopidogrel   75 mg Oral Q breakfast   feeding supplement (NEPRO CARB STEADY)  237 mL Oral BID BM   gentamicin  cream  1 Application Topical BID   insulin  aspart  0-15 Units Subcutaneous TID WC   insulin  aspart  0-5 Units Subcutaneous QHS   metolazone   2.5 mg Oral Once   multivitamin with minerals  1 tablet Oral Daily   potassium chloride   40 mEq Oral Once   silver  sulfADIAZINE    Topical Daily    Allergies:  Allergies  Allergen Reactions   Quinolones Other (See Comments)    Patient has aortic aneurysm - fluoroquinolones are contraindicated due to risk of aortic rupture.   Eliquis  [Apixaban ]     Dizziness  and vision change   Spironolactone  Other (See Comments)    dizziness    Family History  Problem Relation Age of Onset   Cancer Niece      Physical Exam: Vitals:   01/29/24 1004 01/29/24 1940 01/30/24 0300 01/30/24 0518  BP: (!) 93/55 129/65    Pulse:  95    Resp:  (!) 22    Temp:  98.6 F (37 C) 97.8 F (36.6 C)   TempSrc:  Oral Axillary   SpO2:  99%    Weight:    107.3 kg  Height:        GEN- Chronically  ill-appearing, NAD, A&O x 3, nods  off during lulls in conversation HEENT: Normocephalic, atraumatic Lungs- CTAB though diminished throughout, Normal effort.  Heart- Regular rate and rhythm, No M/G/R. PPM site c/d/I without tethering or pain  GI- Soft, NT, ND.  Extremities- No clubbing, cyanosis. 2-3+ BLE to knees; bilateral erythemia with scabs. Bilateral feet with dressings in place   Radiology/Studies: US  EKG SITE RITE Result Date: 01/29/2024 If Site Rite image not attached, placement could not be confirmed due to current cardiac rhythm.  ECHOCARDIOGRAM COMPLETE Result Date: 01/29/2024    ECHOCARDIOGRAM REPORT   Patient Name:   Tyler [name] Date of Exam: 01/28/2024 Medical Rec #:  [id number]      Height:       78.0 in Accession #:    [id number]     Weight:       260.8 lb Date of Birth:  10-04-1945      BSA:          2.527 m Patient Age:    77 years       BP:           103/75 mmHg Patient Gender: M              HR:           96 bpm. Exam Location:  [facility] Procedure: 2D Echo, Cardiac Doppler and Color Doppler (Both Spectral and Color            Flow Doppler were utilized during procedure). Indications:     NSTEMI I21.4  History:         Patient has no prior history of Echocardiogram examinations,                  most recent 07/04/2023. CHF and Cardiomyopathy, Previous                  Myocardial Infarction, Pacemaker, PAD and CKD, stage 3,                  Arrythmias:Bradycardia, Atrial Fibrillation and Atrial Flutter,                  Signs/Symptoms:Dyspnea and Syncope; Risk Factors:Diabetes,                  Hypertension and Dyslipidemia.  Sonographer:     Terrilee Few RCS Referring Phys:  [id number] CARALYN HUDSON Diagnosing Phys: Lida Reeks Alluri IMPRESSIONS  1. Left ventricular ejection fraction, by estimation, is 25 to 30%. The left ventricle has severely decreased function. The left ventricle demonstrates global hypokinesis. There is mild left ventricular hypertrophy. Left ventricular diastolic  parameters  are indeterminate.  2. Right ventricular systolic function is moderately reduced. The right ventricular size is mildly enlarged. There is severely elevated pulmonary artery systolic pressure. The estimated right ventricular systolic pressure is 73.9 mmHg.  3. Left atrial size was mildly dilated.  4. Right atrial size was moderately dilated.  5. Eccentric mitral regurgitation which can be underestimated on this study. Recommend TEE for further evaluation. Moderate to severe mitral valve regurgitation. Mild to moderate mitral stenosis. Severe mitral annular calcification.  6. Sclerotic aortic valve with low flow low gradient aortic stenosis, probably mild to moderate.. The aortic valve is tricuspid.  7. Aortic dilatation noted. There is mild dilatation of the aortic root, measuring 44 mm. FINDINGS  Left Ventricle: Left ventricular ejection fraction, by estimation, is 25 to 30%. The left ventricle has severely decreased function. The left ventricle demonstrates global hypokinesis. There is mild left ventricular  hypertrophy. Left ventricular diastolic parameters are indeterminate. Right Ventricle: The right ventricular size is mildly enlarged. No increase in right ventricular wall thickness. Right ventricular systolic function is moderately reduced. There is severely elevated pulmonary artery systolic pressure. The tricuspid regurgitant velocity is 4.06 m/s, and with an assumed right atrial pressure of 8 mmHg, the estimated right ventricular systolic pressure is 73.9 mmHg. Left Atrium: Left atrial size was mildly dilated. Right Atrium: Right atrial size was moderately dilated. Pericardium: There is no evidence of pericardial effusion. Mitral Valve: Eccentric mitral regurgitation which can be underestimated on this study. Recommend TEE for further evaluation. There is mild thickening of the mitral valve leaflet(s). There is mild calcification of the mitral valve leaflet(s). Severe mitral annular  calcification. Moderate to severe mitral valve regurgitation, with anteriorly-directed jet. Mild to moderate mitral valve stenosis. MV peak gradient, 14.7 mmHg. The mean mitral valve gradient is 8.4 mmHg. Tricuspid Valve: The tricuspid valve is normal in structure. Tricuspid valve regurgitation is mild. Aortic Valve: Sclerotic aortic valve with low flow low gradient aortic stenosis, probably mild to moderate. The aortic valve is tricuspid. Aortic valve mean gradient measures 10.0 mmHg. Aortic valve peak gradient measures 16.4 mmHg. Aortic valve area, by  VTI measures 1.43 cm. Pulmonic Valve: The pulmonic valve was not well visualized. Pulmonic valve regurgitation is trivial. Aorta: Aortic dilatation noted. There is mild dilatation of the aortic root, measuring 44 mm. IAS/Shunts: The interatrial septum was not well visualized.  LEFT VENTRICLE PLAX 2D LVIDd:         5.50 cm   Diastology LVIDs:         4.50 cm   LV e' medial:    5.87 cm/s LV PW:         1.60 cm   LV E/e' medial:  22.5 LV IVS:        1.50 cm   LV e' lateral:   10.80 cm/s LVOT diam:     2.30 cm   LV E/e' lateral: 12.2 LV SV:         53 LV SV Index:   21 LVOT Area:     4.15 cm  RIGHT VENTRICLE            IVC RV S prime:     8.59 cm/s  IVC diam: 3.00 cm TAPSE (M-mode): 1.6 cm LEFT ATRIUM              Index        RIGHT ATRIUM           Index LA diam:        5.90 cm  2.33 cm/m   RA Area:     28.30 cm LA Vol (A2C):   114.0 ml 45.11 ml/m  RA Volume:   112.00 ml 44.32 ml/m LA Vol (A4C):   79.7 ml  31.54 ml/m LA Biplane Vol: 95.1 ml  37.63 ml/m  AORTIC VALVE AV Area (Vmax):    1.77 cm AV Area (Vmean):   1.56 cm AV Area (VTI):     1.43 cm AV Vmax:           202.50 cm/s AV Vmean:          144.500 cm/s AV VTI:            0.375 m AV Peak Grad:      16.4 mmHg AV Mean Grad:      10.0 mmHg LVOT Vmax:         86.13 cm/s LVOT  Vmean:        54.333 cm/s LVOT VTI:          0.129 m LVOT/AV VTI ratio: 0.34  AORTA Ao Asc diam: 3.80 cm MITRAL VALVE                 TRICUSPID VALVE MV Area (PHT): 2.66 cm     TR Peak grad:   65.9 mmHg MV Area VTI:   1.36 cm     TR Vmax:        406.00 cm/s MV Peak grad:  14.7 mmHg MV Mean grad:  8.4 mmHg     SHUNTS MV Vmax:       1.91 m/s     Systemic VTI:  0.13 m MV Vmean:      134.8 cm/s   Systemic Diam: 2.30 cm MV Decel Time: 285 msec MV E velocity: 132.00 cm/s MV A velocity: 98.50 cm/s MV E/A ratio:  1.34 Joetta Mustache Electronically signed by Joetta Mustache Signature Date/Time: 01/29/2024/1:23:56 PM    Final    CT Angio Chest Pulmonary Embolism (PE) W or WO Contrast Result Date: 01/27/2024 CLINICAL DATA:  Chest pain.  PE suspected EXAM: CT ANGIOGRAPHY CHEST WITH CONTRAST TECHNIQUE: Multidetector CT imaging of the chest was performed using the standard protocol during bolus administration of intravenous contrast. Multiplanar CT image reconstructions and MIPs were obtained to evaluate the vascular anatomy. RADIATION DOSE REDUCTION: This exam was performed according to the departmental dose-optimization program which includes automated exposure control, adjustment of the mA and/or kV according to patient size and/or use of iterative reconstruction technique. CONTRAST:  75mL OMNIPAQUE IOHEXOL 350 MG/ML SOLN COMPARISON:  Same day chest radiograph FINDINGS: Cardiovascular: Cardiomegaly. Mitral annular calcification. Coronary artery and aortic atherosclerotic calcification. Mild dilation of the ascending aorta measuring 40 mm in diameter. No pericardial effusion. Negative for acute pulmonary embolism. Left chest wall pacemaker. Mediastinum/Nodes: Trachea and esophagus are unremarkable. Shotty mediastinal lymph nodes are likely reactive. Lungs/Pleura: Small right pleural effusion. Interlobular septal thickening and hazy ground-glass opacities with a lower lung and posterior predominance. No focal consolidation, pleural effusion, or pneumothorax. Upper Abdomen: No acute abnormality. Musculoskeletal: No acute fracture. Review of the MIP images  confirms the above findings. IMPRESSION: 1. Negative for acute pulmonary embolism. 2. Findings suggestive of CHF with mild pulmonary edema. Small right pleural effusion. 3. 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 Recommend annual imaging followup by CTA or MRA. This recommendation follows 2010 ACCF/AHA/AATS/ACR/ASA/SCA/SCAI/SIR/STS/SVM Guidelines for the Diagnosis and Management of Patients with Thoracic Aortic Disease. Circulation. 2010; 121: Z610-R604. Aortic aneurysm NOS (ICD10-I71.9) 4. Aortic Atherosclerosis (ICD10-I70.0). Electronically Signed   By: Rozell Cornet M.D.   On: 01/27/2024 22:11   CT Head Wo Contrast Result Date: 01/26/2024 CLINICAL DATA:  Mental status change. EXAM: CT HEAD WITHOUT CONTRAST TECHNIQUE: Contiguous axial images were obtained from the base of the skull through the vertex without intravenous contrast. RADIATION DOSE REDUCTION: This exam was performed according to the departmental dose-optimization program which includes automated exposure control, adjustment of the mA and/or kV according to patient size and/or use of iterative reconstruction technique. COMPARISON:  Head CT 07/04/2018. FINDINGS: Brain: No evidence of acute infarction, hemorrhage, hydrocephalus, extra-axial collection or mass lesion/mass effect. There is mild diffuse atrophy and mild periventricular white matter hypodensity which is similar to the prior study. Vascular: Atherosclerotic calcifications are present within the cavernous internal carotid arteries. Skull: Normal. Negative for fracture or focal lesion. Sinuses/Orbits: No acute finding. Other: None. IMPRESSION: 1. No acute intracranial process.  2. Mild diffuse atrophy and mild chronic small vessel ischemic changes. Electronically Signed   By: Tyron Gallon M.D.   On: 01/26/2024 20:18   DG Chest Portable 1 View Result Date: 01/26/2024 CLINICAL DATA:  Chest pain EXAM: PORTABLE CHEST 1 VIEW COMPARISON:  09/11/2023 FINDINGS: Left-sided pacing device similar  in position. Cardiomegaly with mild central congestion. No consolidation, pleural effusion or pneumothorax. IMPRESSION: Cardiomegaly with mild central congestion. Electronically Signed   By: Esmeralda Hedge M.D.   On: 01/26/2024 20:14    TTE, 07/03/2023  1. Left ventricular ejection fraction, by estimation, is 30 to 35%. The left ventricle has moderately decreased function. The left ventricle demonstrates regional wall motion abnormalities (see scoring diagram/findings for description). Indeterminate diastolic filling due to E-A fusion.   2. Right ventricular systolic function is normal. The right ventricular size is normal.   3. The mitral valve is normal in structure. Moderate mitral valve regurgitation. No evidence of mitral stenosis.   4. The aortic valve is normal in structure. Aortic valve regurgitation is not visualized. No aortic stenosis is present.   5. The inferior vena cava is normal in size with greater than 50% respiratory variability, suggesting right atrial pressure of 3 mmHg.    EKG: 01/26/2024 - ST at 112 with occasional PVCs, LBBB, LAD  09/11/2023 - AS with intermittent VP at 95;  (personally reviewed)   TELEMETRY: AS-VP with frequent PVC (may be ventricular sense) Rates 80-90s   (personally reviewed)   DEVICE HISTORY:  MDT dual chamber PPM, imp 08/2020 Battery 9.3 years Threshold, sensing, impedence stable Rare, brief AF episodes, less than 1 hour High VP at 97.5% Extended AV delay, high VP continues     Assessment/Plan: #) strep agalactiae bacteremia #) PPM in situ #) CHB #) chronic lower extremity wounds #) indwelling catheter #) HFrEF, newly diagnosed   Patient presents with worsening HF symptoms. Found to be bacteremic with strep agalactia with PPM in situ. PPM implanted 2021 for CHB. Cotninues to have high VP with high VP. TTE without obvious lead veg TEE pending, potentially tomorrow  IV abx per ID Diuresis / HV mgmt per HF team Agree with TEE to  evaluate for lead vegetation. Will discuss PPM explant after TEE results.    EP will continue to follow         For questions or updates, please contact [facility] Please consult [email or web address] for contact info under Cardiology/STEMI.  Signed, Eban Weick, NP  01/30/2024 8:20 AM

## 2024-01-30 NOTE — Progress Notes (Addendum)
 PROGRESS NOTE    Tyler [name]  WUJ:[id number] DOB: [birth year] DOA: 01/26/2024 PCP: McClanahan, Kyra, NP  Subjective: Pt seen and examined. Met with dtr [name] at bedside. Cardiology may proceed with TEE tomorrow. Decision will be made tomorrow AM. NPO after MN in case TEE can be performed tomorrow. Remains on IV ABX.  Pt slightly less SOB today. More awake today.   Hospital Course: Taken from H&P.  Haji A Jillson is a 78 y.o. 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  He was also experiencing 5/10, nonradiating dull chest pain with no other associated symptoms.  On presentation patient has mild tachycardia.  Labs with BUN of 41, creatinine 1.5, baseline seems to be around 1.3, CO2 of 17, troponin 69>.105 EKG shows A-fib with RVR, PVCs and LBBB. CT head was negative for any acute intracranial abnormality CXR with cardiomegaly and mild central congestion.  Patient was initially started on heparin  infusion and cardiology was consulted.  5/5: Vital stable.  Worsening leukocytosis at 21.9, platelet at 143, none anion gap metabolic acid with worsening of bicarb to 16, BNP significantly elevated at 4043. Troponin continued to increase, currently at 714. Cardiology started him on IV Bumex .  Echocardiogram ordered  5/6: Rapid response was called overnight when patient became very tachycardic and tachypneic, found to be febrile at 102.9.  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 Blood cultures started growing group B streptococcus this morning-antibiotics are being switched to ceftriaxone .  ID was consulted.  Patient also has a pacemaker in place. Rising troponin, maximum recorded at 1830, improving leukocytosis, potassium 3.3 and worsening creatinine at 2.05 so further IV Bumex  was held this morning.  HPI: Tyler [name] is a 78 y.o. Caucasian male with medical history significant for HFpEF, osteoarthritis, atrial fibrillation and flutter, coronary artery disease, stage III chronic kidney disease, dyslipidemia and hypertension, as well as type 2 diabetes mellitus, who presented to the emergency room with a cancer of dizziness, lightheadedness and confusion that started around 4 PM today when he came back home from work.  This was associated with chest pain and dyspnea.  He describes his chest pain as dull aching graded 5/10 in severity with no radiation, nausea or vomiting or diaphoresis or palpitations.  His symptoms later resolved spontaneously.    He feels tactile fever without chills.  No cough or wheezing or vomitus.  No leg pain or edema recent travel or surgery.  No dysuria, oliguria or hematuria or flank pain.  No bleeding diathesis.   The patient was started on IV heparin  per ACS protocol. He will be admitted to a cardiac telemetry observation bed for further evaluation and management.   Significant Events: Admitted 01/26/2024 for chest pain 01-27-2024 blood cx positive for Group B Strep agalactiae  Significant Labs: Na 137, k 4.2, CO2 of 17, BUN 41, Scr 1.5, glu 189 Ca 9.3, TP 71. Alb 3.8 WBC 18.2, HgB 11.5, plt 164 Tropoin I 69-->105-->326 A1c 6.9% BNP 4043 01-27-2024 Blood cx Group  B strep agalactiae  Significant Imaging Studies: CT head No acute intracranial process. 2. Mild diffuse atrophy and mild chronic small vessel ischemic changes CXR Cardiomegaly with mild  central congestion  CTPA Negative for acute pulmonary embolism. 2. Findings suggestive of CHF with mild pulmonary edema. Small right pleural effusion. 3. 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 Recommend annual imaging followup by CTA or MRA. 01-28-2024 echo LVEF 25%  Antibiotic Therapy: Anti-infectives (From admission, onward)    Start     Dose/Rate Route Frequency Ordered Stop   01/28/24 1600  vancomycin  (VANCOREADY) IVPB 2000 mg/400 mL  Status:  Discontinued        2,000 mg 200 mL/hr over 120 Minutes Intravenous Every 24 hours 01/27/24 2009 01/28/24 0844   01/28/24 1600  cefTRIAXone  (ROCEPHIN ) 2 g in sodium chloride  0.9 % 100 mL IVPB        2 g 200 mL/hr over 30 Minutes Intravenous Daily 01/28/24 0844     01/28/24 0800  ceFEPIme  (MAXIPIME ) 2 g in sodium chloride  0.9 % 100 mL IVPB  Status:  Discontinued        2 g 200 mL/hr over 30 Minutes Intravenous Every 12 hours 01/27/24 1950 01/28/24 0844   01/27/24 2030  ceFEPIme  (MAXIPIME ) 2 g in sodium chloride  0.9 % 100 mL IVPB        2 g 200 mL/hr over 30 Minutes Intravenous  Once 01/27/24 1942 01/27/24 2242   01/27/24 2030  metroNIDAZOLE  (FLAGYL ) IVPB 500 mg  Status:  Discontinued        500 mg 100 mL/hr over 60 Minutes Intravenous Every 12 hours 01/27/24 1942 01/28/24 0844   01/27/24 2030  vancomycin  (VANCOCIN ) IVPB 1000 mg/200 mL premix  Status:  Discontinued        1,000 mg 200 mL/hr over 60 Minutes Intravenous  Once 01/27/24 1942 01/27/24 1948   01/27/24 1945  vancomycin  (VANCOREADY) IVPB 2000 mg/400 mL        2,000 mg 200 mL/hr over 120 Minutes Intravenous  Once 01/27/24 1948 01/27/24 2304   01/26/24 2300  doxycycline  (VIBRA -TABS) tablet 100 mg  Status:  Discontinued        100 mg Oral 2 times daily 01/26/24 2254 01/26/24 2346       Procedures:   Consultants: Cardiology CHF EP/Cards ID    Assessment and Plan: * NSTEMI (non-ST elevated myocardial infarction) (HCC) On admission through 01-28-2024 Concern of NSTEMI,  type I versus type II Increasing troponin, currently at 714.  Continue to have some chest discomfort. Cardiology is on board -Continue with heparin  infusion - Likely will get ischemic workup after diuresing - Continue with aspirin , Plavix  and atorvastatin   01-29-2024 remains on IV heparin  gtts. No word yet from cards on ischemic eval.  01-30-2024 remains on IV heparin  gtts   Septicemia due to group B Streptococcus (HCC) 01-29-2024 ID consulted. They want TEE to be performed due to high index of suspicion for endocarditis +/- PPM infection.  Cardiology deferring TEE until next week due to respiratory status.  01-30-2024 concern for endocarditis of valve or PPM leads.  Acute kidney injury superimposed on stage 3a chronic kidney disease (HCC) - baseline scr 1.3 On admission through 01-28-2024. Worsening creatinine, AKI on chronic CKD stage IIIa. Worsening non-anion gap metabolic acidosis, multifactorial with AKI, CHF and also concern of sepsis. IV Bumex  was held by cardiology today -Giving gentle IV fluid with bicarb infusion -Monitor renal function -Avoid nephrotoxins  01-29-2024 worsening Scr today. Scr up to 2.33 today. Given  his low EF I will avoid IVF for now. Cards gave him 160 mg of IV lasix  today.  Defer to cardiology to manage his AKI/CKD if they are continuing his diuresis.  01-30-2024 Scr 2.44 today.  Received 160 mg IV lasix . 2.3 liters in urine out yesterday. Pt states breathing is better. Defer to CHF team for further diuresis.  Acute on chronic systolic CHF (congestive heart failure) (HCC) On admission through 01-28-2024 Patient with volume overload, positive JVD and elevated BNP at 4043 Echo cardiogram ordered-pending -Cardiology started him on IV Bumex  2 mg twice daily which was held today due to worsening creatinine -Advanced heart failure team consulted -Continue with metolazone  -Daily weight and BMP -Strict intake and output  01-29-2024 will let cards and CHF team  manage pt's volume status with diuretics, etc.  01-30-2024 management per CHF team. Yesterday echo LVEF 25%  Sepsis due to cellulitis [facility]) On admission through 01-28-2024 Patient met sepsis criteria after developing high-grade fever with tachycardia overnight.  Likely due to lower extremity cellulitis and bilateral multiple foot wounds. Received broad-spectrum antibiotics overnight Preliminary blood cultures with group B strep agalactia - De-escalate antibiotics to ceftriaxone  - ID was consulted - Echocardiogram pending  01-29-2024 ID consulting. Concern for potential endocarditis +/- PPM infection.   01-30-2024 potential for TEE tomorrow. Decision will be made tomorrow AM. NPO after MN in case TEE can be performed tomorrow. Remains on IV ABX.   Pressure injury of skin Agree with RN documentation regarding pressure injury to skin.  Pressure Injury 01/27/24 Heel Right Unstageable - Full thickness tissue loss in which the base of the injury is covered by slough (yellow, tan, gray, green or brown) and/or eschar (tan, brown or black) in the wound bed. (Active)  01/27/24 2000  Location: Heel  Location Orientation: Right  Staging: Unstageable - Full thickness tissue loss in which the base of the injury is covered by slough (yellow, tan, gray, green or brown) and/or eschar (tan, brown or black) in the wound bed.  Wound Description (Comments):   Present on Admission: Yes     Pressure Injury 01/27/24 Heel Right Stage 2 -  Partial thickness loss of dermis presenting as a shallow open injury with a red, pink wound bed without slough. (Active)  01/27/24 2000  Location: Heel  Location Orientation: Right  Staging: Stage 2 -  Partial thickness loss of dermis presenting as a shallow open injury with a red, pink wound bed without slough.  Wound Description (Comments):   Present on Admission: Yes     Pressure Injury 01/27/24 Heel Left Stage 2 -  Partial thickness loss of dermis presenting as a  shallow open injury with a red, pink wound bed without slough. (Active)  01/27/24 2000  Location: Heel  Location Orientation: Left  Staging: Stage 2 -  Partial thickness loss of dermis presenting as a shallow open injury with a red, pink wound bed without slough.  Wound Description (Comments):   Present on Admission: Yes      Essential hypertension On admission through 01-28-2024 Blood pressure within goal, being maintained with diuretics. Not on any other antihypertensives at home. -Continue to monitor  01-30-2024 stable. HTN meds on hold due to borderline low BP  Dyslipidemia On admission through 01-28-2024. Continue atorvastatin   01-30-2024 stable.  Type II diabetes mellitus with renal manifestations (HCC) On admission through 01-28-2024 Will place the patient on supplement coverage with NovoLog . - Holding home glipizide  and metformin .  01-29-2024 stable.  01-30-2024 stable.  DVT prophylaxis:  Place TED hose Start: 01/29/24 0753  IV Heparin    Code Status: Full Code Family Communication: discussed with pt's dtr [name] at bedside Disposition Plan: unknown Reason for continuing need for hospitalization: remains on IV heparin  gtts, IV abx. Awaiting decision on TEE tomorrow.  Objective: Vitals:   01/30/24 0900 01/30/24 1000 01/30/24 1053 01/30/24 1241  BP: 105/61 103/70 (!) 114/94 92/69  Pulse: 85 (!) 48 76 61  Resp: 19 13 17 18   Temp:    98.3 F (36.8 C)  TempSrc:      SpO2: 97% 95% 99% 98%  Weight:      Height:        Intake/Output Summary (Last 24 hours) at 01/30/2024 1419 Last data filed at 01/30/2024 0531 Gross per 24 hour  Intake 2010.34 ml  Output 2325 ml  Net -314.66 ml   Filed Weights   01/26/24 1926 01/29/24 0530 01/30/24 0518  Weight: 118.3 kg 107.5 kg 107.3 kg    Examination:  Physical Exam Vitals and nursing note reviewed.  Constitutional:      General: He is not in acute distress.    Appearance: He is not toxic-appearing or diaphoretic.      Comments: Appears chronically ill More awake today  HENT:     Head: Normocephalic and atraumatic.  Eyes:     General: No scleral icterus. Cardiovascular:     Rate and Rhythm: Normal rate.     Heart sounds: Murmur heard.  Pulmonary:     Effort: Pulmonary effort is normal.     Comments: Coarse BS today. Scattered rhonchi No wheezing Abdominal:     General: Bowel sounds are normal. There is no distension.     Palpations: Abdomen is soft.  Skin:    General: Skin is warm and dry.     Capillary Refill: Capillary refill takes less than 2 seconds.  Neurological:     Mental Status: He is oriented to person, place, and time.     Data Reviewed: I have personally reviewed following labs and imaging studies  CBC: Recent Labs  Lab 01/26/24 1936 01/27/24 0151 01/28/24 0401 01/29/24 0701 01/30/24 0248  WBC 18.2* 21.9* 13.0* 9.4 8.3  NEUTROABS 16.2*  --   --   --   --   HGB 11.5* 11.0* 10.3* 10.4* 9.7*  HCT 36.3* 34.6* 32.6* 32.4* 30.4*  MCV 88.5 87.8 87.4 85.9 86.1  PLT 164 143* 117* 120* 123*   Basic Metabolic Panel: Recent Labs  Lab 01/27/24 0151 01/27/24 2043 01/28/24 0401 01/29/24 0701 01/30/24 0248  NA 136 136 138 136 136  K 4.4 4.0 3.3* 3.9 3.3*  CL 109 108 105 103 102  CO2 16* 19* 18* 20* 22  GLUCOSE 223* 145* 120* 160* 176*  BUN 45* 61* 61* 86* 98*  CREATININE 1.56* 1.82* 2.05* 2.33* 2.44*  CALCIUM  8.6* 8.8* 8.3* 8.3* 8.5*  MG  --   --   --   --  2.4   GFR: Estimated Creatinine Clearance: 31.7 mL/min (A) (by C-G formula based on SCr of 2.44 mg/dL (H)). Liver Function Tests: Recent Labs  Lab 01/26/24 1936 01/27/24 2043  AST 24 28  ALT 24 22  ALKPHOS 78 58  BILITOT 0.8 1.2  PROT 7.1 6.9  ALBUMIN 3.8 3.2*   Coagulation Profile: Recent Labs  Lab 01/27/24 2043  INR 1.5*   BNP (last 3 results) Recent Labs    09/11/23 1627 10/28/23 1107 01/27/24 0844  BNP 3,874.2* 754.7* 4,043.5*   CBG: Recent Labs  Lab 01/29/24 1258 01/29/24 1739  01/29/24 2113 01/30/24 0834 01/30/24 1237  GLUCAP 243* 210* 181* 195* 224*   Sepsis Labs: Recent Labs  Lab 01/27/24 1826 01/27/24 2043 01/27/24 2207  PROCALCITON  --  7.52  --   LATICACIDVEN 2.3* 1.5 1.4    Recent Results (from the past 240 hours)  Culture, blood (x 2)     Status: Abnormal   Collection Time: 01/27/24  8:43 PM   Specimen: BLOOD  Result Value Ref Range Status   Specimen Description   Final    BLOOD BLOOD LEFT ARM LAC Performed at [facility], [address], [place], [place] [address]    Special Requests   Final    BOTTLES DRAWN AEROBIC AND ANAEROBIC Blood Culture adequate volume Performed at [facility], [address] [place], [place] [address]    Culture  Setup Time   Final    GRAM POSITIVE COCCI IN BOTH AEROBIC AND ANAEROBIC BOTTLES CRITICAL RESULT CALLED TO, READ BACK BY AND VERIFIED WITH: TREY GREENWOOD 01/28/24 0811 MW GRAM STAIN REVIEWED-AGREE WITH RESULT DRT    Culture (A)  Final    GROUP B STREP(S.AGALACTIAE)ISOLATED SUSCEPTIBILITIES PERFORMED ON PREVIOUS CULTURE WITHIN THE LAST 5 DAYS. Performed at [facility], [address], [place], [place] [address]    Report Status 01/30/2024 FINAL  Final  Culture, blood (x 2)     Status: Abnormal   Collection Time: 01/27/24  8:49 PM   Specimen: BLOOD  Result Value Ref Range Status   Specimen Description   Final    BLOOD BLOOD LEFT HAND [facility] Performed at [facility], [address], [place], [place] [address]    Special Requests   Final    BOTTLES DRAWN AEROBIC AND ANAEROBIC Blood Culture adequate volume Performed at [facility], [address] [place], [place] [address]    Culture  Setup Time   Final    GRAM POSITIVE COCCI IN BOTH AEROBIC AND ANAEROBIC BOTTLES Organism ID to follow CRITICAL RESULT CALLED TO, READ BACK BY AND VERIFIED WITH: Kathyanne Parkers GREENWOOD 01/28/24 0811 MW GRAM STAIN REVIEWED-AGREE WITH RESULT DRT Performed at [facility]  Lab, [address], [place], [place] [address]    Culture GROUP B STREP(S.AGALACTIAE)ISOLATED (A)  Final   Report Status 01/30/2024 FINAL  Final   Organism ID, Bacteria GROUP B STREP(S.AGALACTIAE)ISOLATED  Final      Susceptibility   Group b strep(s.agalactiae)isolated - MIC*    CLINDAMYCIN RESISTANT Resistant     AMPICILLIN <=0.25 SENSITIVE Sensitive     ERYTHROMYCIN >=8 RESISTANT Resistant     VANCOMYCIN  0.5 SENSITIVE Sensitive     CEFTRIAXONE  <=0.12 SENSITIVE Sensitive     LEVOFLOXACIN  0.5 SENSITIVE Sensitive     PENICILLIN <=0.06 SENSITIVE Sensitive     * GROUP B STREP(S.AGALACTIAE)ISOLATED  Blood Culture ID Panel (Reflexed)     Status: Abnormal   Collection Time: 01/27/24  8:49 PM  Result Value Ref Range Status   Enterococcus faecalis NOT DETECTED NOT DETECTED Final   Enterococcus Faecium NOT DETECTED NOT DETECTED Final   Listeria monocytogenes NOT DETECTED NOT DETECTED Final   Staphylococcus species NOT DETECTED NOT DETECTED Final   Staphylococcus aureus (BCID) NOT DETECTED NOT DETECTED Final   Staphylococcus epidermidis NOT DETECTED NOT DETECTED Final   Staphylococcus lugdunensis NOT DETECTED NOT DETECTED Final   Streptococcus species DETECTED (A) NOT DETECTED Final    Comment: CRITICAL RESULT CALLED TO, READ BACK BY AND VERIFIED WITH: TREY GREENWOOD  01/28/24 0811 MW    Streptococcus agalactiae DETECTED (A) NOT DETECTED Final    Comment: CRITICAL RESULT CALLED TO, READ BACK BY AND VERIFIED WITH: TREY GREENWOOD 01/28/24 0811 MW    Streptococcus pneumoniae NOT DETECTED NOT DETECTED Final   Streptococcus pyogenes NOT DETECTED NOT DETECTED Final   A.calcoaceticus-baumannii NOT DETECTED NOT DETECTED Final   Bacteroides fragilis NOT DETECTED NOT DETECTED Final   Enterobacterales NOT DETECTED NOT DETECTED Final   Enterobacter cloacae complex NOT DETECTED NOT DETECTED Final   Escherichia coli NOT DETECTED NOT DETECTED Final   Klebsiella aerogenes NOT DETECTED NOT DETECTED Final    Klebsiella oxytoca NOT DETECTED NOT DETECTED Final   Klebsiella pneumoniae NOT DETECTED NOT DETECTED Final   Proteus species NOT DETECTED NOT DETECTED Final   Salmonella species NOT DETECTED NOT DETECTED Final   Serratia marcescens NOT DETECTED NOT DETECTED Final   Haemophilus influenzae NOT DETECTED NOT DETECTED Final   Neisseria meningitidis NOT DETECTED NOT DETECTED Final   Pseudomonas aeruginosa NOT DETECTED NOT DETECTED Final   Stenotrophomonas maltophilia NOT DETECTED NOT DETECTED Final   Candida albicans NOT DETECTED NOT DETECTED Final   Candida auris NOT DETECTED NOT DETECTED Final   Candida glabrata NOT DETECTED NOT DETECTED Final   Candida krusei NOT DETECTED NOT DETECTED Final   Candida parapsilosis NOT DETECTED NOT DETECTED Final   Candida tropicalis NOT DETECTED NOT DETECTED Final   Cryptococcus neoformans/gattii NOT DETECTED NOT DETECTED Final    Comment: Performed at [facility], [address] [place], [place] [address]  MRSA Next Gen by PCR, Nasal     Status: None   Collection Time: 01/28/24  1:00 AM   Specimen: Nasal Mucosa; Nasal Swab  Result Value Ref Range Status   MRSA by PCR Next Gen NOT DETECTED NOT DETECTED Final    Comment: (NOTE) The GeneXpert MRSA Assay (FDA approved for NASAL specimens only), is one component of a comprehensive MRSA colonization surveillance program. It is not intended to diagnose MRSA infection nor to guide or monitor treatment for MRSA infections. Test performance is not FDA approved in patients less than 66 years old. Performed at [facility], [address] [place], [place] [address]   Aerobic Culture w Gram Stain (superficial specimen)     Status: None (Preliminary result)   Collection Time: 01/29/24  6:35 AM   Specimen: Foot; Wound  Result Value Ref Range Status   Specimen Description   Final    FOOT RIGHT Performed at [facility], [address] [place], [place] [address]    Special  Requests   Final    NONE Performed at [facility], [address] [place], [place] [address]    Gram Stain NO WBC SEEN RARE GRAM POSITIVE COCCI IN PAIRS   Final   Culture   Final    CULTURE REINCUBATED FOR BETTER GROWTH Performed at [facility], [address], [place], [place] [address]    Report Status PENDING  Incomplete  Aerobic Culture w Gram Stain (superficial specimen)     Status: None (Preliminary result)   Collection Time: 01/29/24  6:37 AM   Specimen: Foot; Wound  Result Value Ref Range Status   Specimen Description   Final    FOOT LEFT Performed at [facility], [address] [place], [place] [address]    Special Requests   Final    NONE Performed at [facility], [address] [place], [place] [address]  Gram Stain NO WBC SEEN RARE GRAM POSITIVE COCCI IN PAIRS   Final   Culture   Final    RARE STAPHYLOCOCCUS AUREUS RARE ENTEROCOCCUS FAECALIS CULTURE REINCUBATED FOR BETTER GROWTH RARE GROUP B STREP(S.AGALACTIAE)ISOLATED TESTING AGAINST S. AGALACTIAE NOT ROUTINELY PERFORMED DUE TO PREDICTABILITY OF AMP/PEN/VAN SUSCEPTIBILITY. Performed at [facility], [address] [place], [place] [address]    Report Status PENDING  Incomplete  Culture, blood (Routine X 2) w Reflex to ID Panel     Status: None (Preliminary result)   Collection Time: 01/29/24  6:11 PM   Specimen: BLOOD  Result Value Ref Range Status   Specimen Description BLOOD BLOOD RIGHT HAND  Final   Special Requests   Final    BOTTLES DRAWN AEROBIC AND ANAEROBIC Blood Culture adequate volume   Culture   Final    NO GROWTH < 12 HOURS Performed at [facility], [address] [place], [place] [address]    Report Status PENDING  Incomplete  Culture, blood (Routine X 2) w Reflex to ID Panel     Status: None (Preliminary result)   Collection Time: 01/29/24  7:42 PM   Specimen: BLOOD  Result Value Ref Range Status   Specimen Description BLOOD  BLOOD RIGHT HAND  Final   Special Requests   Final    BOTTLES DRAWN AEROBIC AND ANAEROBIC Blood Culture adequate volume   Culture   Final    NO GROWTH < 12 HOURS Performed at [facility], [address], [place], [place] [address]    Report Status PENDING  Incomplete     Radiology Studies: US  EKG SITE RITE Result Date: 01/29/2024 If Site Rite image not attached, placement could not be confirmed due to current cardiac rhythm.  ECHOCARDIOGRAM COMPLETE Result Date: 01/29/2024    ECHOCARDIOGRAM REPORT   Patient Name:   TORAO CASPER Date of Exam: 01/28/2024 Medical Rec #:  [id number]      Height:       78.0 in Accession #:    [id number]     Weight:       260.8 lb Date of Birth:  [birth year]      BSA:          2.527 m Patient Age:    77 years       BP:           103/75 mmHg Patient Gender: M              HR:           96 bpm. Exam Location:  [facility] Procedure: 2D Echo, Cardiac Doppler and Color Doppler (Both Spectral and Color            Flow Doppler were utilized during procedure). Indications:     NSTEMI I21.4  History:         Patient has no prior history of Echocardiogram examinations,                  most recent 07/04/2023. CHF and Cardiomyopathy, Previous                  Myocardial Infarction, Pacemaker, PAD and CKD, stage 3,                  Arrythmias:Bradycardia, Atrial Fibrillation and Atrial Flutter,                  Signs/Symptoms:Dyspnea and Syncope; Risk Factors:Diabetes,  Hypertension and Dyslipidemia.  Sonographer:     Terrilee Few RCS Referring Phys:  [id number] CARALYN HUDSON Diagnosing Phys: Lida Reeks Alluri IMPRESSIONS  1. Left ventricular ejection fraction, by estimation, is 25 to 30%. The left ventricle has severely decreased function. The left ventricle demonstrates global hypokinesis. There is mild left ventricular hypertrophy. Left ventricular diastolic parameters  are indeterminate.  2. Right ventricular systolic function is moderately reduced. The right  ventricular size is mildly enlarged. There is severely elevated pulmonary artery systolic pressure. The estimated right ventricular systolic pressure is 73.9 mmHg.  3. Left atrial size was mildly dilated.  4. Right atrial size was moderately dilated.  5. Eccentric mitral regurgitation which can be underestimated on this study. Recommend TEE for further evaluation. Moderate to severe mitral valve regurgitation. Mild to moderate mitral stenosis. Severe mitral annular calcification.  6. Sclerotic aortic valve with low flow low gradient aortic stenosis, probably mild to moderate.. The aortic valve is tricuspid.  7. Aortic dilatation noted. There is mild dilatation of the aortic root, measuring 44 mm. FINDINGS  Left Ventricle: Left ventricular ejection fraction, by estimation, is 25 to 30%. The left ventricle has severely decreased function. The left ventricle demonstrates global hypokinesis. There is mild left ventricular hypertrophy. Left ventricular diastolic parameters are indeterminate. Right Ventricle: The right ventricular size is mildly enlarged. No increase in right ventricular wall thickness. Right ventricular systolic function is moderately reduced. There is severely elevated pulmonary artery systolic pressure. The tricuspid regurgitant velocity is 4.06 m/s, and with an assumed right atrial pressure of 8 mmHg, the estimated right ventricular systolic pressure is 73.9 mmHg. Left Atrium: Left atrial size was mildly dilated. Right Atrium: Right atrial size was moderately dilated. Pericardium: There is no evidence of pericardial effusion. Mitral Valve: Eccentric mitral regurgitation which can be underestimated on this study. Recommend TEE for further evaluation. There is mild thickening of the mitral valve leaflet(s). There is mild calcification of the mitral valve leaflet(s). Severe mitral annular calcification. Moderate to severe mitral valve regurgitation, with anteriorly-directed jet. Mild to moderate mitral  valve stenosis. MV peak gradient, 14.7 mmHg. The mean mitral valve gradient is 8.4 mmHg. Tricuspid Valve: The tricuspid valve is normal in structure. Tricuspid valve regurgitation is mild. Aortic Valve: Sclerotic aortic valve with low flow low gradient aortic stenosis, probably mild to moderate. The aortic valve is tricuspid. Aortic valve mean gradient measures 10.0 mmHg. Aortic valve peak gradient measures 16.4 mmHg. Aortic valve area, by  VTI measures 1.43 cm. Pulmonic Valve: The pulmonic valve was not well visualized. Pulmonic valve regurgitation is trivial. Aorta: Aortic dilatation noted. There is mild dilatation of the aortic root, measuring 44 mm. IAS/Shunts: The interatrial septum was not well visualized.  LEFT VENTRICLE PLAX 2D LVIDd:         5.50 cm   Diastology LVIDs:         4.50 cm   LV e' medial:    5.87 cm/s LV PW:         1.60 cm   LV E/e' medial:  22.5 LV IVS:        1.50 cm   LV e' lateral:   10.80 cm/s LVOT diam:     2.30 cm   LV E/e' lateral: 12.2 LV SV:         53 LV SV Index:   21 LVOT Area:     4.15 cm  RIGHT VENTRICLE            IVC RV S prime:  8.59 cm/s  IVC diam: 3.00 cm TAPSE (M-mode): 1.6 cm LEFT ATRIUM              Index        RIGHT ATRIUM           Index LA diam:        5.90 cm  2.33 cm/m   RA Area:     28.30 cm LA Vol (A2C):   114.0 ml 45.11 ml/m  RA Volume:   112.00 ml 44.32 ml/m LA Vol (A4C):   79.7 ml  31.54 ml/m LA Biplane Vol: 95.1 ml  37.63 ml/m  AORTIC VALVE AV Area (Vmax):    1.77 cm AV Area (Vmean):   1.56 cm AV Area (VTI):     1.43 cm AV Vmax:           202.50 cm/s AV Vmean:          144.500 cm/s AV VTI:            0.375 m AV Peak Grad:      16.4 mmHg AV Mean Grad:      10.0 mmHg LVOT Vmax:         86.13 cm/s LVOT Vmean:        54.333 cm/s LVOT VTI:          0.129 m LVOT/AV VTI ratio: 0.34  AORTA Ao Asc diam: 3.80 cm MITRAL VALVE                TRICUSPID VALVE MV Area (PHT): 2.66 cm     TR Peak grad:   65.9 mmHg MV Area VTI:   1.36 cm     TR Vmax:         406.00 cm/s MV Peak grad:  14.7 mmHg MV Mean grad:  8.4 mmHg     SHUNTS MV Vmax:       1.91 m/s     Systemic VTI:  0.13 m MV Vmean:      134.8 cm/s   Systemic Diam: 2.30 cm MV Decel Time: 285 msec MV E velocity: 132.00 cm/s MV A velocity: 98.50 cm/s MV E/A ratio:  1.34 Lida Reeks Alluri Electronically signed by Joetta Mustache Signature Date/Time: 01/29/2024/1:23:56 PM    Final     Scheduled Meds:  vitamin C  500 mg Oral BID   aspirin  EC  81 mg Oral Daily   atorvastatin   80 mg Oral q1800   Chlorhexidine  Gluconate Cloth  6 each Topical Daily   clopidogrel   75 mg Oral Q breakfast   feeding supplement (NEPRO CARB STEADY)  237 mL Oral BID BM   gentamicin  cream  1 Application Topical BID   insulin  aspart  0-15 Units Subcutaneous TID WC   insulin  aspart  0-5 Units Subcutaneous QHS   multivitamin with minerals  1 tablet Oral Daily   silver  sulfADIAZINE    Topical Daily   Continuous Infusions:  cefTRIAXone  (ROCEPHIN )  IV Stopped (01/29/24 1439)   heparin  2,300 Units/hr (01/30/24 0822)     LOS: 2 days   Time spent: 50 minutes  Unk Garb, DO  [facility]  01/30/2024, 2:19 PM

## 2024-01-30 NOTE — Progress Notes (Signed)
 Date of Admission:  01/26/2024      ID: Tyler [name] is a 78 y.o. 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)   Type II diabetes mellitus with renal manifestations (HCC)   Dyslipidemia   Essential hypertension   Acute kidney injury superimposed on stage 3a chronic kidney disease (HCC) - baseline scr 1.3   Pressure injury of skin   Septicemia due to group B Streptococcus (HCC)    Subjective: Patient has some short of breath Left knee was aspirated this afternoon  Medications:   vitamin C  500 mg Oral BID   aspirin  EC  81 mg Oral Daily   atorvastatin   80 mg Oral q1800   Chlorhexidine  Gluconate Cloth  6 each Topical Daily   clopidogrel   75 mg Oral Q breakfast   feeding supplement (NEPRO CARB STEADY)  237 mL Oral BID BM   gentamicin  cream  1 Application Topical BID   insulin  aspart  0-15 Units Subcutaneous TID WC   insulin  aspart  0-5 Units Subcutaneous QHS   multivitamin with minerals  1 tablet Oral Daily   silver  sulfADIAZINE    Topical Daily    Objective: Vital signs in last 24 hours: Patient Vitals for the past 24 hrs:  BP Temp Temp src Pulse Resp SpO2 Weight  01/30/24 1241 92/69 98.3 F (36.8 C) -- 61 18 98 % --  01/30/24 1053 (!) 114/94 -- -- 76 17 99 % --  01/30/24 1000 103/70 -- -- (!) 48 13 95 % --  01/30/24 0900 105/61 -- -- 85 19 97 % --  01/30/24 0836 116/75 98.7 F (37.1 C) -- 94 18 96 % --  01/30/24 0518 -- -- -- -- -- -- 107.3 kg  01/30/24 0300 -- 97.8 F (36.6 C) Axillary -- -- -- --  01/29/24 1940 129/65 98.6 F (37 C) Oral 95 (!) 22 99 % --       PHYSICAL EXAM:  General: Alert, cooperative, ill Lungs: Bilateral air entry. Baseline crepts Heart: Irregular Abdomen: Soft, non-tender,not distended. Bowel sounds normal. No masses Extremities: Left knee mildly swollen compared to the  right Restricted movement Skin: No rashes or lesions. Or bruising Lymph: Cervical, supraclavicular normal. Neurologic: Grossly non-focal  Lab Results    Latest Ref Rng & Units 01/30/2024    2:48 AM 01/29/2024    7:01 AM 01/28/2024    4:01 AM  CBC  WBC 4.0 - 10.5 K/uL 8.3  9.4  13.0   Hemoglobin 13.0 - 17.0 g/dL 9.7  82.9  56.2   Hematocrit 39.0 - 52.0 % 30.4  32.4  32.6   Platelets 150 - 400 K/uL 123  120  117        Latest Ref Rng & Units 01/30/2024    2:48 AM 01/29/2024    7:01 AM 01/28/2024    4:01 AM  CMP  Glucose 70 - 99 mg/dL 130  865  784   BUN 8 - 23 mg/dL 98  86  61   Creatinine 0.61 - 1.24 mg/dL 6.96  2.95  2.84   Sodium 135 - 145 mmol/L 136  136  138   Potassium 3.5 - 5.1 mmol/L 3.3  3.9  3.3   Chloride 98 - 111 mmol/L 102  103  105   CO2 22 - 32 mmol/L 22  20  18  Calcium  8.9 - 10.3 mg/dL 8.5  8.3  8.3       Microbiology:  Studies/Results: US  EKG SITE RITE Result Date: 01/29/2024 If Site Rite image not attached, placement could not be confirmed due to current cardiac rhythm.  ECHOCARDIOGRAM COMPLETE Result Date: 01/29/2024    ECHOCARDIOGRAM REPORT   Patient Name:   Tyler [name] Date of Exam: 01/28/2024 Medical Rec #:  [id number]      Height:       78.0 in Accession #:    [id number]     Weight:       260.8 lb Date of Birth:  [birth year]      BSA:          2.527 m Patient Age:    77 years       BP:           103/75 mmHg Patient Gender: M              HR:           96 bpm. Exam Location:  [facility] Procedure: 2D Echo, Cardiac Doppler and Color Doppler (Both Spectral and Color            Flow Doppler were utilized during procedure). Indications:     NSTEMI I21.4  History:         Patient has no prior history of Echocardiogram examinations,                  most recent 07/04/2023. CHF and Cardiomyopathy, Previous                  Myocardial Infarction, Pacemaker, PAD and CKD, stage 3,                  Arrythmias:Bradycardia, Atrial Fibrillation and Atrial Flutter,                   Signs/Symptoms:Dyspnea and Syncope; Risk Factors:Diabetes,                  Hypertension and Dyslipidemia.  Sonographer:     Terrilee Few RCS Referring Phys:  [id number] CARALYN HUDSON Diagnosing Phys: Lida Reeks Alluri IMPRESSIONS  1. Left ventricular ejection fraction, by estimation, is 25 to 30%. The left ventricle has severely decreased function. The left ventricle demonstrates global hypokinesis. There is mild left ventricular hypertrophy. Left ventricular diastolic parameters  are indeterminate.  2. Right ventricular systolic function is moderately reduced. The right ventricular size is mildly enlarged. There is severely elevated pulmonary artery systolic pressure. The estimated right ventricular systolic pressure is 73.9 mmHg.  3. Left atrial size was mildly dilated.  4. Right atrial size was moderately dilated.  5. Eccentric mitral regurgitation which can be underestimated on this study. Recommend TEE for further evaluation. Moderate to severe mitral valve regurgitation. Mild to moderate mitral stenosis. Severe mitral annular calcification.  6. Sclerotic aortic valve with low flow low gradient aortic stenosis, probably mild to moderate.. The aortic valve is tricuspid.  7. Aortic dilatation noted. There is mild dilatation of the aortic root, measuring 44 mm. FINDINGS  Left Ventricle: Left ventricular ejection fraction, by estimation, is 25 to 30%. The left ventricle has severely decreased function. The left ventricle demonstrates global hypokinesis. There is mild left ventricular hypertrophy. Left ventricular diastolic parameters are indeterminate. Right Ventricle: The right ventricular size is mildly enlarged. No increase in right ventricular wall thickness. Right ventricular systolic function is moderately reduced. There is severely elevated pulmonary artery systolic pressure.  The tricuspid regurgitant velocity is 4.06 m/s, and with an assumed right atrial pressure of 8 mmHg, the estimated right ventricular  systolic pressure is 73.9 mmHg. Left Atrium: Left atrial size was mildly dilated. Right Atrium: Right atrial size was moderately dilated. Pericardium: There is no evidence of pericardial effusion. Mitral Valve: Eccentric mitral regurgitation which can be underestimated on this study. Recommend TEE for further evaluation. There is mild thickening of the mitral valve leaflet(s). There is mild calcification of the mitral valve leaflet(s). Severe mitral annular calcification. Moderate to severe mitral valve regurgitation, with anteriorly-directed jet. Mild to moderate mitral valve stenosis. MV peak gradient, 14.7 mmHg. The mean mitral valve gradient is 8.4 mmHg. Tricuspid Valve: The tricuspid valve is normal in structure. Tricuspid valve regurgitation is mild. Aortic Valve: Sclerotic aortic valve with low flow low gradient aortic stenosis, probably mild to moderate. The aortic valve is tricuspid. Aortic valve mean gradient measures 10.0 mmHg. Aortic valve peak gradient measures 16.4 mmHg. Aortic valve area, by  VTI measures 1.43 cm. Pulmonic Valve: The pulmonic valve was not well visualized. Pulmonic valve regurgitation is trivial. Aorta: Aortic dilatation noted. There is mild dilatation of the aortic root, measuring 44 mm. IAS/Shunts: The interatrial septum was not well visualized.  LEFT VENTRICLE PLAX 2D LVIDd:         5.50 cm   Diastology LVIDs:         4.50 cm   LV e' medial:    5.87 cm/s LV PW:         1.60 cm   LV E/e' medial:  22.5 LV IVS:        1.50 cm   LV e' lateral:   10.80 cm/s LVOT diam:     2.30 cm   LV E/e' lateral: 12.2 LV SV:         53 LV SV Index:   21 LVOT Area:     4.15 cm  RIGHT VENTRICLE            IVC RV S prime:     8.59 cm/s  IVC diam: 3.00 cm TAPSE (M-mode): 1.6 cm LEFT ATRIUM              Index        RIGHT ATRIUM           Index LA diam:        5.90 cm  2.33 cm/m   RA Area:     28.30 cm LA Vol (A2C):   114.0 ml 45.11 ml/m  RA Volume:   112.00 ml 44.32 ml/m LA Vol (A4C):   79.7 ml   31.54 ml/m LA Biplane Vol: 95.1 ml  37.63 ml/m  AORTIC VALVE AV Area (Vmax):    1.77 cm AV Area (Vmean):   1.56 cm AV Area (VTI):     1.43 cm AV Vmax:           202.50 cm/s AV Vmean:          144.500 cm/s AV VTI:            0.375 m AV Peak Grad:      16.4 mmHg AV Mean Grad:      10.0 mmHg LVOT Vmax:         86.13 cm/s LVOT Vmean:        54.333 cm/s LVOT VTI:          0.129 m LVOT/AV VTI ratio: 0.34  AORTA Ao Asc diam: 3.80 cm MITRAL VALVE  TRICUSPID VALVE MV Area (PHT): 2.66 cm     TR Peak grad:   65.9 mmHg MV Area VTI:   1.36 cm     TR Vmax:        406.00 cm/s MV Peak grad:  14.7 mmHg MV Mean grad:  8.4 mmHg     SHUNTS MV Vmax:       1.91 m/s     Systemic VTI:  0.13 m MV Vmean:      134.8 cm/s   Systemic Diam: 2.30 cm MV Decel Time: 285 msec MV E velocity: 132.00 cm/s MV A velocity: 98.50 cm/s MV E/A ratio:  1.34 Joetta Mustache Electronically signed by Joetta Mustache Signature Date/Time: 01/29/2024/1:23:56 PM    Final      Assessment/Plan:  Group B streptococcus bacteremia 4 out of 4 high bioburden.  With pacemaker need to rule out endocarditis and wire vegetation.  Likely will need pacemaker removal 2D echo has not shown valve vegetations so he will need TEE.  Discussed with cardiology and they will do it once he is stable Will avoid PICC line placement until the bacteremia has cleared and also once we know the status of his heart valves.  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 Need to rule out septic arthritis- ortho has aspirated the knee and sent for Cellcount, crystals and culture-   Discussed the management with the patient and care team

## 2024-01-30 NOTE — Progress Notes (Addendum)
 Speech Language Pathology Treatment: Dysphagia  Patient Details Name: Tyler [name] MRN: [id number] DOB: [birth year] Today's Date: 01/30/2024 Time: 4098-1191 SLP Time Calculation (min) (ACUTE ONLY): 50 min  Assessment / Plan / Recommendation Clinical Impression  Pt seen today for ongoing assessment of swallowing and toleration of oral diet; thin liquids. Pt is now scheduled for procedure in the morning per chart notes. NPO post midnight. Pt awake, resting in bed. Pt verbally engaged; responses short but appropriate though ?min confusion. He appeared fatigued and was accepting of only a few po's initially. He denies any h/o swallowing issues; Family present agreed but they noted how weak he was "now". Pt has No neurological history per chart.  On [place] O2 2L; afebrile.    Pt appears to present w/ functional oral phase swallowing but noted inconsistent pharyngeal phase swallowing deficits/dysphagia w/ Thin liquids despite following aspiration precautions. Overt clinical s/s of aspiration noted (inconsistently) during po trials of Thin liquids -- this was NOT noted w/ trials of Nectar consistency liquids w/ general aspiration precautions(no straws).  Pt appears at reduced risk for aspiration when following general aspiration precautions and using a modified, Dysphagia diet including Nectar liquids; a mech soft diet for ease of chewing/mastication w/ solids -- for conservation of energy. Pt does have challenging factors that could impact oropharyngeal swallowing to include fatigue/weakness, lengthy illness w/ Multiple Comorbidities, CardioPulmonary decline, and hospitalization. These factors can increase risk for aspiration, dysphagia as well as decreased oral intake overall.    During po trials of Thin liquids, pt fed self via Cup using single sips -- educated on small sips. Pt Inconsistent coughing (often delayed) noted x3 during several trials. No overt Pulmonary decline/change. Pt did not seem aware of  the relationship of the coughing to drinking the thin liquids.  When he was given Nectar liquids via Cup, he consumed this consistency (4ozs) w/ no overt coughing, decline in vocal quality, nor change in respiratory presentation during/post trials. Oral phase w/ all trials appeared [facility] w/ timely bolus management, mastication, and control of bolus propulsion for A-P transfer for swallowing. Oral clearing achieved w/ all trial consistencies -- moistened, soft foods AND breaks b/t bites/sips given. No Bottom Dentition. Pt helped to feed self by holding cup and w/ MOD+ setup support.    Recommend a more Mech Soft consistency diet w/ well-Cut meats, moistened foods for conservation of energy when chewing; now Nectar consistency liquids -- NO STRAWS, and pt should help to Hold Cup when drinking. Recommend general aspiration precautions, reduce distractions and Talking during oral intake. Tray setup and sitting Up support for po intake. Small bites/sips Slowly. Pills WHOLE in Puree for safer, easier swallowing -- encouraged now and for D/C to the pt.    Education given on Pills in Puree; food consistencies and easy to eat options; general aspiration precautions to pt and Daughter present. NSG updated, agreed. MD updated. ST services will f/u next 1-2 days for ongoing education, monitoring. Recommended Palliative Care f/u for GOC overall. Recommend Dietician f/u for support. Precautions posted in room, chart.     HPI HPI: Pt is a 78 y.o. Caucasian male with medical history significant for NSTEMI, HFpEF, wound care for toe amputation, lymphedema, osteoarthritis, osteomyelitis w/ toe amputation, r pleural effusion w/ drain, atrial fibrillation and flutter, coronary artery disease, RA, stage III chronic kidney disease, PVD, dyslipidemia and hypertension, as well as type 2 diabetes mellitus, who presented to the emergency room with a cancer of dizziness, lightheadedness and confusion that started  around 4 PM today when  he came back home from work.  This was associated with chest pain and dyspnea.  He describes his chest pain as dull aching graded 5/10 in severity with no radiation, nausea or vomiting or diaphoresis or palpitations.  His symptoms later resolved spontaneously.   Chest CT imaging: Lungs/Pleura: Small right pleural effusion. Interlobular septal  thickening and hazy ground-glass opacities with a lower lung and  posterior predominance. No focal consolidation, pleural effusion, or  pneumothorax.  Findings suggestive of CHF.      SLP Plan  Continue with current plan of care      Recommendations for follow up therapy are one component of a multi-disciplinary discharge planning process, led by the attending physician.  Recommendations may be updated based on patient status, additional functional criteria and insurance authorization.    Recommendations  Diet recommendations: Dysphagia 3 (mechanical soft);Regular;Nectar-thick liquid Liquids provided via: Cup;No straw Medication Administration: Whole meds with puree Supervision: Patient able to self feed;Intermittent supervision to cue for compensatory strategies Compensations: Minimize environmental distractions;Slow rate;Small sips/bites;Lingual sweep for clearance of pocketing;Follow solids with liquid Postural Changes and/or Swallow Maneuvers: Out of bed for meals;Seated upright 90 degrees;Upright 30-60 min after meal (REFLUX precs.)                 (Palliative Care f/u for GOC discussion; Dietician) Oral care BID;Oral care before and after PO;Staff/trained caregiver to provide oral care ([employer] care)   Intermittent Supervision/Assistance Dysphagia, pharyngoesophageal phase (R13.14) (potential Esophageal phase dysmotility -- Belching noted b/t trials)     Continue with current plan of care       Darla Edward, MS, CCC-SLP Speech Language Pathologist Rehab Services; [facility] [phone]  (ascom) Tyler [name]  01/30/2024, 6:41 PM

## 2024-01-30 NOTE — Progress Notes (Signed)
 Patient appointment with Tyler [name] @ [facility] cancelled for Monday, May 12th @ 3:00 PM since patient is currently hospitalized.  Will rescheduled hospital follow-up as discharge gets closer.

## 2024-01-30 NOTE — Progress Notes (Addendum)
 [facility] CLINIC CARDIOLOGY PROGRESS NOTE       Patient ID: Tyler [name] MRN: [id number] DOB/AGE: 05/02/1946 78 y.o.  Admit date: 01/26/2024 Referring Physician Dr. Amalia Jung Primary Physician Jewell Mose, NP  Primary Cardiologist Dr. Bob Burn Reason for Consultation chest pain  HPI: Tyler [name] is a 78 y.o. male  with a past medical history of chronic HFrEF, persistent atrial fibrillation, mitral insufficiency, CHB s/p PPM 2021, hypertension, chronic kidney disease stage III who presented to the ED on 01/26/2024 for chest pain. Cardiology was consulted for further evaluation.   Interval history: -Patient seen and examined this morning, resting comfortably in hospital bed. -Feeling better today, had good UOP yesterday evening.  -Creatinine continues to uptrend. -Remains on heparin  gtt.  -Blood cx positive with strep agalactiae. ID recommending TEE.   Review of systems complete and found to be negative unless listed above    Past Medical History:  Diagnosis Date   (HFpEF) heart failure with preserved ejection fraction (HCC) 03/01/2020   a.) TTE 03/01/2020: EF 55-60%, mod MAC, mod AoV sclerosis, triv AR, mild TR, mod MR, RVSP 50-59; b.) TTE 09/10/2020: EF 55-60%, mod MAC, mod AoV sclerosis, mild TR, 3+ MR, RVSP 50-59; c.) TTE 09/26/2020: EF 55-60%, mild LA dil, triv PR, mild MR/TR, RVSP 37-49   Adenoma of left adrenal gland    Anemia    Arthritis    Atrial fibrillation and flutter (HCC)    a.) CHA2DS2VASc = 5 (age x2, HFpEF, HTN, T2DM);  b.) s/p CTI ablation 09/07/2020; c.) rate/rhythm maintained on oral carvedilol ; not on chronic anticoagulation therapy   CAD (coronary artery disease)    Cardiomegaly    CKD (chronic kidney disease), stage III (HCC)    DOE (dyspnea on exertion)    Drug-induced bradycardia    Gangrene of toe of left foot (HCC)    a.) 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   H/O active rheumatic fever 08/22/2020   Hepatosplenomegaly    History of  bilateral cataract extraction    HLD (hyperlipidemia)    Hypertension    Long term current use of aspirin     Lymphedema of both lower extremities    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)    a.) s/p amputation 11/04/2022   Peripheral vascular disease (HCC)    Pleural effusion on right 09/09/2020   a.) s/p RIGHT thoracentesis with 2180 cc yield   Pneumonia    Presence of permanent cardiac pacemaker 09/10/2020   a.) TVP placement 09/10/2020 due to intermittent CHB in setting of urosepsis; b.) s/p PPM placement 09/15/2020: MDT Azure XT DR (SN: [id number])   Pulmonary hypertension (HCC) 03/01/2020   a.) TTE: 03/01/2020: RVSP 50-59; b.) TTE 09/10/2020: RVSP 50-59; c.) TTE 09/26/2020: RVSP 37-49   RA (rheumatoid arthritis) (HCC)    Rheumatic fever    Sepsis (HCC) 09/10/2020   a.) urosepsis --> BC x 2 sets and UC all grew out significant Proteus mirabilis; admitted to [facility] 09/07/2020 - 09/27/2020.   Sick sinus syndrome [facility])    a.) s/p MDT PPM placement 09/15/2020   T2DM (type 2 diabetes mellitus) (HCC)    Urinary retention    chronic, with indwelling Foley catheter and plans for a suprapubic   Wears dentures    full upper    Past Surgical History:  Procedure Laterality Date   AMPUTATION TOE Right 11/04/2022   Procedure: AMPUTATION TOE;  Surgeon: Dot Gazella, [name];  [name];  Service: Podiatry;  Laterality: Right;   AMPUTATION TOE Right 09/15/2023   Procedure: AMPUTATION TOE;  Surgeon: Jennefer Moats, [name];  [name];  Service: Orthopedics/Podiatry;  Laterality: Right;  Right hallux amputation   BONE BIOPSY Right 04/05/2023   Procedure: BONE BIOPSY THIRD & FOURTH;  Surgeon: Dot Gazella, [name];  [name];  Service: Podiatry;  Laterality: Right;   CARDIAC ELECTROPHYSIOLOGY STUDY AND ABLATION N/A 09/07/2020   Procedure: CARDIAC EP STUDY AND ABLATION (CTI)   CATARACT EXTRACTION W/PHACO Right 01/16/2017   Procedure: CATARACT  EXTRACTION PHACO AND INTRAOCULAR LENS PLACEMENT (IOC)  Right Complicated;  Surgeon: Annell Kidney, MD;  Location: [facility];  Service: Ophthalmology;  Laterality: Right;  IVA Block Healon 5 malyugin vision blue Diabetic - oral meds   CATARACT EXTRACTION W/PHACO Left 10/23/2022   Procedure: CATARACT EXTRACTION PHACO AND INTRAOCULAR LENS PLACEMENT (IOC) LEFT DIABETIC;  Surgeon: Clair Crews, MD;  Location: [facility];  Service: Ophthalmology;  Laterality: Left;  Diabetic   COLONOSCOPY     INCISION AND DRAINAGE OF WOUND Left 09/15/2023   Procedure: IRRIGATION AND DEBRIDEMENT WOUND;  Surgeon: Jennefer Moats, [name];  [name];  Service: Orthopedics/Podiatry;  Laterality: Left;  Left foot wound debridement, possible graft/biopsy   LOWER EXTREMITY ANGIOGRAPHY Right 11/02/2022   Procedure: Lower Extremity Angiography;  Surgeon: Celso College, MD;  Location: [facility] INVASIVE CV LAB;  Service: Cardiovascular;  Laterality: Right;   LOWER EXTREMITY ANGIOGRAPHY Right 09/13/2023   Procedure: Lower Extremity Angiography;  Surgeon: Jackquelyn Mass, MD;  Location: [facility] INVASIVE CV LAB;  Service: Cardiovascular;  Laterality: Right;   METATARSAL HEAD EXCISION Left 07/05/2018   Procedure: RESECTION FIRST METATARSAL INFECTED BONE AND SOFT TISSUE;  Surgeon: Sharlyn Deaner, [name];  [name];  Service: Podiatry;  Laterality: Left;   METATARSAL HEAD EXCISION Right 04/05/2023   Procedure: METATARSAL HEAD EXCISION THIRD & FOURTH;  Surgeon: Dot Gazella, [name];  [name];  Service: Podiatry;  Laterality: Right;   PACEMAKER INSERTION  09/15/2020   TOE AMPUTATION Left    TONSILLECTOMY      Medications Prior to Admission  Medication Sig Dispense Refill Last Dose/Taking   acetaminophen  (TYLENOL ) 500 MG tablet Take 2 tablets (1,000 mg total) by mouth 3 (three) times daily as needed (for pain).   Taking As Needed   aspirin  EC 81 MG tablet Take 1 tablet (81 mg total) by  mouth daily. Swallow whole. 30 tablet 0 01/25/2024   atorvastatin  (LIPITOR) 10 MG tablet Take 1 tablet (10 mg total) by mouth daily at 6 PM. 30 tablet 11 Taking   bumetanide  (BUMEX ) 2 MG tablet Take 1 tablet (2 mg total) by mouth daily. 90 tablet 3 Past Week   clopidogrel  (PLAVIX ) 75 MG tablet Take 1 tablet (75 mg total) by mouth daily with breakfast. 30 tablet 11 01/25/2024 Morning   Finerenone  (KERENDIA ) 10 MG TABS Take 1 tablet (10 mg total) by mouth daily. 30 tablet 5 Past Week   furosemide  (LASIX ) 40 MG tablet Take 40 mg by mouth 2 (two) times daily.   01/25/2024   gentamicin  cream (GARAMYCIN ) 0.1 % Apply 1 Application topically 2 (two) times daily. 30 g 1 Taking   glipiZIDE  (GLUCOTROL ) 10 MG tablet Take 1 tablet (10 mg total) by mouth 2 (two) times daily. 60 tablet 2 Past Month   losartan  (COZAAR ) 25 MG tablet Take 25 mg by mouth daily.   Taking   metolazone  (ZAROXOLYN ) 2.5 MG tablet Take 1 tablet (2.5 mg total) by  mouth 2 (two) times a week. On Mondays & Fridays   Past Month   Multiple Vitamins-Minerals (MULTIVITAMIN WITH MINERALS) tablet Take 1 tablet by mouth daily.   01/25/2024   silver  sulfADIAZINE  (SILVADENE ) 1 % cream Apply to affected area daily 50 g 1 Taking   doxycycline  (VIBRA -TABS) 100 MG tablet Take 1 tablet (100 mg total) by mouth 2 (two) times daily. (Patient not taking: Reported on 01/26/2024) 20 tablet 0 Not Taking   metFORMIN  (GLUCOPHAGE ) 1000 MG tablet Take 1 tablet (1,000 mg total) by mouth 2 (two) times daily. (Patient not taking: Reported on 01/26/2024) 60 tablet 2 Not Taking   Social History   Socioeconomic History   Marital status: Single    Spouse name: Not on file   Number of children: Not on file   Years of education: Not on file   Highest education level: Not on file  Occupational History    Comment: [employer]  Tobacco Use   Smoking status: Never   Smokeless tobacco: Never  Vaping Use   Vaping status: Never Used  Substance and Sexual Activity   Alcohol  use: Not Currently    Comment: rarely   Drug use: Never   Sexual activity: Not Currently    Birth control/protection: None  Other Topics Concern   Not on file  Social History Narrative   Lives with roommate   Social Drivers of Health   Financial Resource Strain: Low Risk  (03/20/2023)   Received [facility]   Overall Financial Resource Strain (CARDIA)    Difficulty of Paying Living Expenses: Not hard at all  Food Insecurity: No Food Insecurity (01/27/2024)   Hunger Vital Sign    Worried About Running Out of Food in the Last Year: Never true    Ran Out of Food in the Last Year: Never true  Transportation Needs: No Transportation Needs (01/27/2024)   PRAPARE - Administrator, Civil Service (Medical): No    Lack of Transportation (Non-Medical): No  Physical Activity: Not on file  Stress: No Stress Concern Present (09/07/2020)   Received from [facility] - Occupational Stress Questionnaire    Feeling of Stress : Not at all  Social Connections: Socially Isolated (01/27/2024)   Social Connection and Isolation Panel [NHANES]    Frequency of Communication with Friends and Family: More than three times a week    Frequency of Social Gatherings with Friends and Family: Twice a week    Attends Religious Services: Never    Database administrator or Organizations: No    Attends Banker Meetings: Never    Marital Status: Divorced  Catering manager Violence: Not At Risk (01/27/2024)   Humiliation, Afraid, Rape, and Kick questionnaire    Fear of Current or Ex-Partner: No    Emotionally Abused: No    Physically Abused: No    Sexually Abused: No    Family History  Problem Relation Age of Onset   Cancer Niece      Vitals:   01/29/24 1940 01/30/24 0300 01/30/24 0518 01/30/24 0836  BP: 129/65   116/75  Pulse: 95   94  Resp: (!) 22   18  Temp: 98.6 F (37 C) 97.8 F (36.6 C)  98.7 F  (37.1 C)  TempSrc: Oral Axillary    SpO2: 99%   96%  Weight:   107.3 kg   Height:  PHYSICAL EXAM General: chronically ill appearing elderly male, well nourished, in no acute distress. HEENT: Normocephalic and atraumatic. Neck: No JVD.  Lungs: Normal respiratory effort on 1L. Bibasilar crackles. Heart: HRRR. Normal S1 and S2 without gallops or murmurs.  Abdomen: Non-distended appearing.  Msk: Normal strength and tone for age. Extremities: Warm and well perfused. No clubbing, cyanosis. 2+ pitting edema bilaterally.  Neuro: Alert and oriented X 3. Psych: Answers questions appropriately.   Labs: Basic Metabolic Panel: Recent Labs    01/29/24 0701 01/30/24 0248  NA 136 136  K 3.9 3.3*  CL 103 102  CO2 20* 22  GLUCOSE 160* 176*  BUN 86* 98*  CREATININE 2.33* 2.44*  CALCIUM  8.3* 8.5*  MG  --  2.4   Liver Function Tests: Recent Labs    01/27/24 2043  AST 28  ALT 22  ALKPHOS 58  BILITOT 1.2  PROT 6.9  ALBUMIN 3.2*   No results for input(s): "LIPASE", "AMYLASE" in the last 72 hours. CBC: Recent Labs    01/29/24 0701 01/30/24 0248  WBC 9.4 8.3  HGB 10.4* 9.7*  HCT 32.4* 30.4*  MCV 85.9 86.1  PLT 120* 123*   Cardiac Enzymes: Recent Labs    01/27/24 2043 01/28/24 0011 01/28/24 0401  TROPONINIHS 1,336* 1,830* 1,804*   BNP: Recent Labs    01/27/24 0844  BNP 4,043.5*   D-Dimer: No results for input(s): "DDIMER" in the last 72 hours. Hemoglobin A1C: No results for input(s): "HGBA1C" in the last 72 hours.  Fasting Lipid Panel: No results for input(s): "CHOL", "HDL", "LDLCALC", "TRIG", "CHOLHDL", "LDLDIRECT" in the last 72 hours. Thyroid Function Tests: No results for input(s): "TSH", "T4TOTAL", "T3FREE", "THYROIDAB" in the last 72 hours.  Invalid input(s): "FREET3" Anemia Panel: No results for input(s): "VITAMINB12", "FOLATE", "FERRITIN", "TIBC", "IRON", "RETICCTPCT" in the last 72 hours.   Radiology: US  EKG SITE RITE Result Date:  01/29/2024 If Site Rite image not attached, placement could not be confirmed due to current cardiac rhythm.  ECHOCARDIOGRAM COMPLETE Result Date: 01/29/2024    ECHOCARDIOGRAM REPORT   Patient Name:   Tyler [name] Date of Exam: 01/28/2024 Medical Rec #:  [id number]      Height:       78.0 in Accession #:    [id number]     Weight:       260.8 lb Date of Birth:  November 08, 1945      BSA:          2.527 m Patient Age:    77 years       BP:           103/75 mmHg Patient Gender: M              HR:           96 bpm. Exam Location:  [facility] Procedure: 2D Echo, Cardiac Doppler and Color Doppler (Both Spectral and Color            Flow Doppler were utilized during procedure). Indications:     NSTEMI I21.4  History:         Patient has no prior history of Echocardiogram examinations,                  most recent 07/04/2023. CHF and Cardiomyopathy, Previous                  Myocardial Infarction, Pacemaker, PAD and CKD, stage 3,  Arrythmias:Bradycardia, Atrial Fibrillation and Atrial Flutter,                  Signs/Symptoms:Dyspnea and Syncope; Risk Factors:Diabetes,                  Hypertension and Dyslipidemia.  Sonographer:     Terrilee Few RCS Referring Phys:  [id number] Christhoper Busbee Diagnosing Phys: Lida Reeks Alluri IMPRESSIONS  1. Left ventricular ejection fraction, by estimation, is 25 to 30%. The left ventricle has severely decreased function. The left ventricle demonstrates global hypokinesis. There is mild left ventricular hypertrophy. Left ventricular diastolic parameters  are indeterminate.  2. Right ventricular systolic function is moderately reduced. The right ventricular size is mildly enlarged. There is severely elevated pulmonary artery systolic pressure. The estimated right ventricular systolic pressure is 73.9 mmHg.  3. Left atrial size was mildly dilated.  4. Right atrial size was moderately dilated.  5. Eccentric mitral regurgitation which can be underestimated on this study. Recommend TEE for  further evaluation. Moderate to severe mitral valve regurgitation. Mild to moderate mitral stenosis. Severe mitral annular calcification.  6. Sclerotic aortic valve with low flow low gradient aortic stenosis, probably mild to moderate.. The aortic valve is tricuspid.  7. Aortic dilatation noted. There is mild dilatation of the aortic root, measuring 44 mm. FINDINGS  Left Ventricle: Left ventricular ejection fraction, by estimation, is 25 to 30%. The left ventricle has severely decreased function. The left ventricle demonstrates global hypokinesis. There is mild left ventricular hypertrophy. Left ventricular diastolic parameters are indeterminate. Right Ventricle: The right ventricular size is mildly enlarged. No increase in right ventricular wall thickness. Right ventricular systolic function is moderately reduced. There is severely elevated pulmonary artery systolic pressure. The tricuspid regurgitant velocity is 4.06 m/s, and with an assumed right atrial pressure of 8 mmHg, the estimated right ventricular systolic pressure is 73.9 mmHg. Left Atrium: Left atrial size was mildly dilated. Right Atrium: Right atrial size was moderately dilated. Pericardium: There is no evidence of pericardial effusion. Mitral Valve: Eccentric mitral regurgitation which can be underestimated on this study. Recommend TEE for further evaluation. There is mild thickening of the mitral valve leaflet(s). There is mild calcification of the mitral valve leaflet(s). Severe mitral annular calcification. Moderate to severe mitral valve regurgitation, with anteriorly-directed jet. Mild to moderate mitral valve stenosis. MV peak gradient, 14.7 mmHg. The mean mitral valve gradient is 8.4 mmHg. Tricuspid Valve: The tricuspid valve is normal in structure. Tricuspid valve regurgitation is mild. Aortic Valve: Sclerotic aortic valve with low flow low gradient aortic stenosis, probably mild to moderate. The aortic valve is tricuspid. Aortic valve mean  gradient measures 10.0 mmHg. Aortic valve peak gradient measures 16.4 mmHg. Aortic valve area, by  VTI measures 1.43 cm. Pulmonic Valve: The pulmonic valve was not well visualized. Pulmonic valve regurgitation is trivial. Aorta: Aortic dilatation noted. There is mild dilatation of the aortic root, measuring 44 mm. IAS/Shunts: The interatrial septum was not well visualized.  LEFT VENTRICLE PLAX 2D LVIDd:         5.50 cm   Diastology LVIDs:         4.50 cm   LV e' medial:    5.87 cm/s LV PW:         1.60 cm   LV E/e' medial:  22.5 LV IVS:        1.50 cm   LV e' lateral:   10.80 cm/s LVOT diam:     2.30 cm   LV E/e' lateral:  12.2 LV SV:         53 LV SV Index:   21 LVOT Area:     4.15 cm  RIGHT VENTRICLE            IVC RV S prime:     8.59 cm/s  IVC diam: 3.00 cm TAPSE (M-mode): 1.6 cm LEFT ATRIUM              Index        RIGHT ATRIUM           Index LA diam:        5.90 cm  2.33 cm/m   RA Area:     28.30 cm LA Vol (A2C):   114.0 ml 45.11 ml/m  RA Volume:   112.00 ml 44.32 ml/m LA Vol (A4C):   79.7 ml  31.54 ml/m LA Biplane Vol: 95.1 ml  37.63 ml/m  AORTIC VALVE AV Area (Vmax):    1.77 cm AV Area (Vmean):   1.56 cm AV Area (VTI):     1.43 cm AV Vmax:           202.50 cm/s AV Vmean:          144.500 cm/s AV VTI:            0.375 m AV Peak Grad:      16.4 mmHg AV Mean Grad:      10.0 mmHg LVOT Vmax:         86.13 cm/s LVOT Vmean:        54.333 cm/s LVOT VTI:          0.129 m LVOT/AV VTI ratio: 0.34  AORTA Ao Asc diam: 3.80 cm MITRAL VALVE                TRICUSPID VALVE MV Area (PHT): 2.66 cm     TR Peak grad:   65.9 mmHg MV Area VTI:   1.36 cm     TR Vmax:        406.00 cm/s MV Peak grad:  14.7 mmHg MV Mean grad:  8.4 mmHg     SHUNTS MV Vmax:       1.91 m/s     Systemic VTI:  0.13 m MV Vmean:      134.8 cm/s   Systemic Diam: 2.30 cm MV Decel Time: 285 msec MV E velocity: 132.00 cm/s MV A velocity: 98.50 cm/s MV E/A ratio:  1.34 Joetta Mustache Electronically signed by Joetta Mustache Signature Date/Time:  01/29/2024/1:23:56 PM    Final    CT Angio Chest Pulmonary Embolism (PE) W or WO Contrast Result Date: 01/27/2024 CLINICAL DATA:  Chest pain.  PE suspected EXAM: CT ANGIOGRAPHY CHEST WITH CONTRAST TECHNIQUE: Multidetector CT imaging of the chest was performed using the standard protocol during bolus administration of intravenous contrast. Multiplanar CT image reconstructions and MIPs were obtained to evaluate the vascular anatomy. RADIATION DOSE REDUCTION: This exam was performed according to the departmental dose-optimization program which includes automated exposure control, adjustment of the mA and/or kV according to patient size and/or use of iterative reconstruction technique. CONTRAST:  75mL OMNIPAQUE IOHEXOL 350 MG/ML SOLN COMPARISON:  Same day chest radiograph FINDINGS: Cardiovascular: Cardiomegaly. Mitral annular calcification. Coronary artery and aortic atherosclerotic calcification. Mild dilation of the ascending aorta measuring 40 mm in diameter. No pericardial effusion. Negative for acute pulmonary embolism. Left chest wall pacemaker. Mediastinum/Nodes: Trachea and esophagus are unremarkable. Shotty mediastinal lymph nodes are likely reactive. Lungs/Pleura: Small right pleural effusion. Interlobular septal  thickening and hazy ground-glass opacities with a lower lung and posterior predominance. No focal consolidation, pleural effusion, or pneumothorax. Upper Abdomen: No acute abnormality. Musculoskeletal: No acute fracture. Review of the MIP images confirms the above findings. IMPRESSION: 1. Negative for acute pulmonary embolism. 2. Findings suggestive of CHF with mild pulmonary edema. Small right pleural effusion. 3. 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 Recommend annual imaging followup by CTA or MRA. This recommendation follows 2010 ACCF/AHA/AATS/ACR/ASA/SCA/SCAI/SIR/STS/SVM Guidelines for the Diagnosis and Management of Patients with Thoracic Aortic Disease. Circulation. 2010; 121: Z610-R604.  Aortic aneurysm NOS (ICD10-I71.9) 4. Aortic Atherosclerosis (ICD10-I70.0). Electronically Signed   By: Rozell Cornet M.D.   On: 01/27/2024 22:11   CT Head Wo Contrast Result Date: 01/26/2024 CLINICAL DATA:  Mental status change. EXAM: CT HEAD WITHOUT CONTRAST TECHNIQUE: Contiguous axial images were obtained from the base of the skull through the vertex without intravenous contrast. RADIATION DOSE REDUCTION: This exam was performed according to the departmental dose-optimization program which includes automated exposure control, adjustment of the mA and/or kV according to patient size and/or use of iterative reconstruction technique. COMPARISON:  Head CT 07/04/2018. FINDINGS: Brain: No evidence of acute infarction, hemorrhage, hydrocephalus, extra-axial collection or mass lesion/mass effect. There is mild diffuse atrophy and mild periventricular white matter hypodensity which is similar to the prior study. Vascular: Atherosclerotic calcifications are present within the cavernous internal carotid arteries. Skull: Normal. Negative for fracture or focal lesion. Sinuses/Orbits: No acute finding. Other: None. IMPRESSION: 1. No acute intracranial process. 2. Mild diffuse atrophy and mild chronic small vessel ischemic changes. Electronically Signed   By: Tyron Gallon M.D.   On: 01/26/2024 20:18   DG Chest Portable 1 View Result Date: 01/26/2024 CLINICAL DATA:  Chest pain EXAM: PORTABLE CHEST 1 VIEW COMPARISON:  09/11/2023 FINDINGS: Left-sided pacing device similar in position. Cardiomegaly with mild central congestion. No consolidation, pleural effusion or pneumothorax. IMPRESSION: Cardiomegaly with mild central congestion. Electronically Signed   By: Esmeralda Hedge M.D.   On: 01/26/2024 20:14    ECHO as above  TELEMETRY reviewed by me 01/30/2024: Paced rhythm with PVCs  EKG reviewed by me: atrial fibrillation, rate 112 bpm PVCs.    Data reviewed by me 01/30/2024: last 24h vitals tele labs imaging I/O's  hospitalist progress note, rapid response notes, advanced heart failure notes  Principal Problem:   NSTEMI (non-ST elevated myocardial infarction) (HCC) Active Problems:   Sepsis due to cellulitis (HCC)   Acute on chronic systolic CHF (congestive heart failure) (HCC)   Type II diabetes mellitus with renal manifestations (HCC)   Dyslipidemia   Essential hypertension   Acute kidney injury superimposed on stage 3a chronic kidney disease (HCC) - baseline scr 1.3   Pressure injury of skin   Septicemia due to group B Streptococcus (HCC)    ASSESSMENT AND PLAN:  Tyler [name] is a 78 y.o. male  with a past medical history of chronic HFrEF, persistent atrial fibrillation, mitral insufficiency, CHB s/p PPM 2021, hypertension, chronic kidney disease stage III who presented to the ED on 01/26/2024 for chest pain. Cardiology was consulted for further evaluation.   # Severe sepsis # Strep agalactiae bacteremia Rapid response called overnight 5/6 for acute decompensated respiratory status, tachycardia, fever.  Meets sepsis criteria. Blood cultures growing strep agalactiae with high bioburden.  No clear evidence of any vegetation on TTE. -ID following, appreciate recommendations.  -ID recommends TEE, consider Friday versus Monday pending respiratory status.  # NSTEMI, type I vs type II # Acute on chronic  HFrEF Patient reports to ED due to worsening SOB. Patient was seen outpatient cardiology on 09/2023 and there was plan for ischemic eval with PE stress test, however patient did not perform this exam or follow-up since this visit. Patient appears fluid overload on exam. Troponins elevated and trending 69 > 105 > 326 > 714 > 1300 > 1800. EKG in ED with AF PVCs with no acute ischemic changes, rate 112 bpm. Cr is elevated at 1.56 (baseline around 1.35). BP is stable. BNP elevated at 4043.  Rapid response called overnight 5/6 for acute decompensated respiratory status, tachycardia, fever.  Meets sepsis  criteria.  Echo this admission with EF 25-30%, global hypokinesis, moderate RV dysfunction, severely elevated PASP, eccentric moderate to severe MR, low-flow low gradient mild to moderate aortic stenosis. -Advanced heart failure following.  Appreciate recommendations. -S/p IV lasix  120 mg 5/6, further diuresis recommendations per advanced heart failure.  -Home GDMT currently held given renal function, acute decompensated HF.  -Continue heparin  gtt.  -Continue ASA 81 mg, clopidogrel  75 mg, atorvastatin  80 mg daily. -After diuresis and patient more stable from HF perspective will decide on ischemic evaluation.   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 Underwent PPM implant in 2021 for CHB, has had regular device checks since implant. EKG in ED and per tele this AM AF rates in 100s.  -Beta-blocker as above. Will resume for rate control when patient is more stable from HF perspective. -Recommend anticoagulation however patient has historically deferred given prior bleeding issues. -EP consulted for further evaluation given high bioburden bacteremia and PPM in place. Appreciate their recommendations.   # Chronic kidney disease stage IIIa Patient with hx of CKD, baseline Cr 1.3-1.4. Cr this AM 2.44. -Continue to monitor renal function closely during diuresis. -Management per primary.    This patient's plan of care was discussed and created with Dr. Bob Burn and he is in agreement.  Signed: Hamp Levine, PA-C  01/30/2024, 8:37 AM [facility]

## 2024-01-30 NOTE — Consult Note (Signed)
 ORTHOPAEDIC CONSULTATION  REQUESTING PHYSICIAN: Unk Garb, DO  Chief Complaint:   Left knee effusion  History of Present Illness: Tyler [name] is a 78 y.o. 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  Patient was treated for an NSTEMI and has been on a heparin  drip.  He was also found to be septicemic with group B strep with a high index of suspicion for endocarditis given the history and also the placement of a pacemaker.  Patient also has chronic wounds on both feet status post multiple surgical interventions and toe removals for infections in the past.  Patient developed bacteremia and was found over yesterday today to have some left knee swelling and pain.  Patient reports he has had knee pain in the past mostly in the right knee related to arthritis but has not swelled like this before.  He denies any prior swelling erythema or any drainage or injury to the knee.  He has never had any procedures on his left knee.  He has been feeling better today and has not had any fevers in the day today and is not tachycardic at time of evaluation.  Last ate today after lunch initiated at 2 PM per patient.  Past Medical History:  Diagnosis Date   (HFpEF) heart failure with preserved ejection fraction (HCC) 03/01/2020   a.) TTE 03/01/2020: EF 55-60%, mod MAC, mod AoV sclerosis, triv AR, mild TR, mod MR, RVSP 50-59; b.) TTE 09/10/2020: EF 55-60%, mod MAC, mod AoV sclerosis, mild TR, 3+ MR, RVSP 50-59; c.) TTE 09/26/2020: EF 55-60%, mild LA dil, triv PR, mild MR/TR, RVSP 37-49   Adenoma of left adrenal gland    Anemia     Arthritis    Atrial fibrillation and flutter (HCC)    a.) CHA2DS2VASc = 5 (age x2, HFpEF, HTN, T2DM);  b.) s/p CTI ablation 09/07/2020; c.) rate/rhythm maintained on oral carvedilol ; not on chronic anticoagulation therapy   CAD (coronary artery disease)    Cardiomegaly    CKD (chronic kidney disease), stage III (HCC)    DOE (dyspnea on exertion)    Drug-induced bradycardia    Gangrene of toe of left foot (HCC)    a.) 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   H/O active rheumatic fever 08/22/2020   Hepatosplenomegaly    History of bilateral cataract extraction    HLD (hyperlipidemia)    Hypertension    Long term current use of aspirin     Lymphedema of both lower extremities    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)    a.) s/p amputation 11/04/2022   Peripheral vascular disease (HCC)    Pleural effusion on right 09/09/2020   a.) s/p RIGHT thoracentesis with 2180 cc yield   Pneumonia    Presence of permanent cardiac pacemaker 09/10/2020   a.) TVP placement 09/10/2020 due to intermittent CHB in setting of urosepsis; b.) s/p PPM placement 09/15/2020: MDT Azure XT DR (SN: [id number])   Pulmonary hypertension (HCC) 03/01/2020   a.) TTE: 03/01/2020: RVSP 50-59; b.) TTE 09/10/2020: RVSP 50-59; c.) TTE 09/26/2020: RVSP 37-49   RA (rheumatoid arthritis) (HCC)    Rheumatic fever    Sepsis (HCC) 09/10/2020   a.) urosepsis --> BC x 2 sets and UC  all grew out significant Proteus mirabilis; admitted to [facility] 09/07/2020 - 09/27/2020.   Sick sinus syndrome [facility])    a.) s/p MDT PPM placement 09/15/2020   T2DM (type 2 diabetes mellitus) (HCC)    Urinary retention    chronic, with indwelling Foley catheter and plans for a suprapubic   Wears dentures    full upper   Past Surgical History:  Procedure Laterality Date   AMPUTATION TOE Right 11/04/2022   Procedure: AMPUTATION TOE;  Surgeon: Dot Gazella, [name];  [name];  Service: Podiatry;  Laterality: Right;    AMPUTATION TOE Right 09/15/2023   Procedure: AMPUTATION TOE;  Surgeon: Jennefer Moats, [name];  [name];  Service: Orthopedics/Podiatry;  Laterality: Right;  Right hallux amputation   BONE BIOPSY Right 04/05/2023   Procedure: BONE BIOPSY THIRD & FOURTH;  Surgeon: Dot Gazella, [name];  [name];  Service: Podiatry;  Laterality: Right;   CARDIAC ELECTROPHYSIOLOGY STUDY AND ABLATION N/A 09/07/2020   Procedure: CARDIAC EP STUDY AND ABLATION (CTI)   CATARACT EXTRACTION W/PHACO Right 01/16/2017   Procedure: CATARACT EXTRACTION PHACO AND INTRAOCULAR LENS PLACEMENT (IOC)  Right Complicated;  Surgeon: Annell Kidney, MD;  Location: [facility];  Service: Ophthalmology;  Laterality: Right;  IVA Block Healon 5 malyugin vision blue Diabetic - oral meds   CATARACT EXTRACTION W/PHACO Left 10/23/2022   Procedure: CATARACT EXTRACTION PHACO AND INTRAOCULAR LENS PLACEMENT (IOC) LEFT DIABETIC;  Surgeon: Clair Crews, MD;  Location: [facility];  Service: Ophthalmology;  Laterality: Left;  Diabetic   COLONOSCOPY     INCISION AND DRAINAGE OF WOUND Left 09/15/2023   Procedure: IRRIGATION AND DEBRIDEMENT WOUND;  Surgeon: Jennefer Moats, [name];  [name];  Service: Orthopedics/Podiatry;  Laterality: Left;  Left foot wound debridement, possible graft/biopsy   LOWER EXTREMITY ANGIOGRAPHY Right 11/02/2022   Procedure: Lower Extremity Angiography;  Surgeon: Celso College, MD;  Location: [facility];  Service: Cardiovascular;  Laterality: Right;   LOWER EXTREMITY ANGIOGRAPHY Right 09/13/2023   Procedure: Lower Extremity Angiography;  Surgeon: Jackquelyn Mass, MD;  Location: [facility];  Service: Cardiovascular;  Laterality: Right;   METATARSAL HEAD EXCISION Left 07/05/2018   Procedure: RESECTION FIRST METATARSAL INFECTED BONE AND SOFT TISSUE;  Surgeon: Sharlyn Deaner, [name];  [name];  Service: Podiatry;  Laterality: Left;   METATARSAL  HEAD EXCISION Right 04/05/2023   Procedure: METATARSAL HEAD EXCISION THIRD & FOURTH;  Surgeon: Dot Gazella, [name];  [name];  Service: Podiatry;  Laterality: Right;   PACEMAKER INSERTION  09/15/2020   TOE AMPUTATION Left    TONSILLECTOMY     Social History   Socioeconomic History   Marital status: Single    Spouse name: Not on file   Number of children: Not on file   Years of education: Not on file   Highest education level: Not on file  Occupational History    Comment: [employer]  Tobacco Use   Smoking status: Never   Smokeless tobacco: Never  Vaping Use   Vaping status: Never Used  Substance and Sexual Activity   Alcohol use: Not Currently    Comment: rarely   Drug use: Never   Sexual activity: Not Currently    Birth control/protection: None  Other Topics Concern   Not on file  Social History Narrative   Lives with roommate   Social Drivers of Health   Financial Resource Strain: Low Risk  (03/20/2023)   Received  from [facility], [facility]   Overall Financial Resource Strain (CARDIA)    Difficulty of Paying Living Expenses: Not hard at all  Food Insecurity: No Food Insecurity (01/27/2024)   Hunger Vital Sign    Worried About Running Out of Food in the Last Year: Never true    Ran Out of Food in the Last Year: Never true  Transportation Needs: No Transportation Needs (01/27/2024)   PRAPARE - Administrator, Civil Service (Medical): No    Lack of Transportation (Non-Medical): No  Physical Activity: Not on file  Stress: No Stress Concern Present (09/07/2020)   Received from [facility], [facility] Occupational Health - Occupational Stress Questionnaire    Feeling of Stress : Not at all  Social Connections: Socially Isolated (01/27/2024)   Social Connection and Isolation Panel [NHANES]    Frequency of Communication with Friends and Family: More than three times a week    Frequency of Social Gatherings with  Friends and Family: Twice a week    Attends Religious Services: Never    Database administrator or Organizations: No    Attends Engineer, structural: Never    Marital Status: Divorced   Family History  Problem Relation Age of Onset   Cancer Niece    Allergies  Allergen Reactions   Quinolones Other (See Comments)    Patient has aortic aneurysm - fluoroquinolones are contraindicated due to risk of aortic rupture.   Eliquis  [Apixaban ]     Dizziness  and vision change   Spironolactone  Other (See Comments)    dizziness   Prior to Admission medications   Medication Sig Start Date End Date Taking? Authorizing Provider  acetaminophen  (TYLENOL ) 500 MG tablet Take 2 tablets (1,000 mg total) by mouth 3 (three) times daily as needed (for pain). 08/18/23  Yes Garrison Kanner, MD  aspirin  EC 81 MG tablet Take 1 tablet (81 mg total) by mouth daily. Swallow whole. 11/07/22  Yes Alexander, Natalie, DO  atorvastatin  (LIPITOR) 10 MG tablet Take 1 tablet (10 mg total) by mouth daily at 6 PM. 10/17/23 01/26/24 Yes Hackney, Cleotis Daily, FNP  bumetanide  (BUMEX ) 2 MG tablet Take 1 tablet (2 mg total) by mouth daily. 09/11/23  Yes Charlette Console, FNP  clopidogrel  (PLAVIX ) 75 MG tablet Take 1 tablet (75 mg total) by mouth daily with breakfast. 10/17/23 10/11/24 Yes Hackney, Brian Campanile A, FNP  Finerenone  (KERENDIA ) 10 MG TABS Take 1 tablet (10 mg total) by mouth daily. 11/01/23  Yes Hackney, Brian Campanile A, FNP  furosemide  (LASIX ) 40 MG tablet Take 40 mg by mouth 2 (two) times daily. 10/09/23  Yes [provider]  gentamicin  cream (GARAMYCIN ) 0.1 % Apply 1 Application topically 2 (two) times daily. 05/31/23  Yes Dot Gazella, [name]  glipiZIDE  (GLUCOTROL ) 10 MG tablet Take 1 tablet (10 mg total) by mouth 2 (two) times daily. 08/18/23  Yes Garrison Kanner, MD  losartan  (COZAAR ) 25 MG tablet Take 25 mg by mouth daily.   Yes [provider]  metolazone  (ZAROXOLYN ) 2.5 MG tablet Take 1 tablet (2.5 mg total) by mouth 2 (two) times  a week. On Mondays & Fridays 09/12/23  Yes Sheril Dines, MD  Multiple Vitamins-Minerals (MULTIVITAMIN WITH MINERALS) tablet Take 1 tablet by mouth daily.   Yes [provider]  silver  sulfADIAZINE  (SILVADENE ) 1 % cream Apply to affected area daily 06/25/23 06/24/24 Yes Dot Gazella, [name]  doxycycline  (VIBRA -TABS) 100 MG tablet Take 1 tablet (  100 mg total) by mouth 2 (two) times daily. Patient not taking: Reported on 01/26/2024 12/31/23   Dot Gazella, [name]  metFORMIN  (GLUCOPHAGE ) 1000 MG tablet Take 1 tablet (1,000 mg total) by mouth 2 (two) times daily. Patient not taking: Reported on 01/26/2024 08/18/23   Garrison Kanner, MD   US  EKG SITE RITE Result Date: 01/29/2024 If Site Rite image not attached, placement could not be confirmed due to current cardiac rhythm.  ECHOCARDIOGRAM COMPLETE Result Date: 01/29/2024    ECHOCARDIOGRAM REPORT   Patient Name:   VRAJ TEASE Date of Exam: 01/28/2024 Medical Rec #:  [id number]      Height:       78.0 in Accession #:    [id number]     Weight:       260.8 lb Date of Birth:  04/10/1946      BSA:          2.527 m Patient Age:    77 years       BP:           103/75 mmHg Patient Gender: M              HR:           96 bpm. Exam Location:  [facility] Procedure: 2D Echo, Cardiac Doppler and Color Doppler (Both Spectral and Color            Flow Doppler were utilized during procedure). Indications:     NSTEMI I21.4  History:         Patient has no prior history of Echocardiogram examinations,                  most recent 07/04/2023. CHF and Cardiomyopathy, Previous                  Myocardial Infarction, Pacemaker, PAD and CKD, stage 3,                  Arrythmias:Bradycardia, Atrial Fibrillation and Atrial Flutter,                  Signs/Symptoms:Dyspnea and Syncope; Risk Factors:Diabetes,                  Hypertension and Dyslipidemia.  Sonographer:     Terrilee Few RCS Referring Phys:  [id number] CARALYN HUDSON Diagnosing Phys: Lida Reeks Alluri IMPRESSIONS  1. Left ventricular  ejection fraction, by estimation, is 25 to 30%. The left ventricle has severely decreased function. The left ventricle demonstrates global hypokinesis. There is mild left ventricular hypertrophy. Left ventricular diastolic parameters  are indeterminate.  2. Right ventricular systolic function is moderately reduced. The right ventricular size is mildly enlarged. There is severely elevated pulmonary artery systolic pressure. The estimated right ventricular systolic pressure is 73.9 mmHg.  3. Left atrial size was mildly dilated.  4. Right atrial size was moderately dilated.  5. Eccentric mitral regurgitation which can be underestimated on this study. Recommend TEE for further evaluation. Moderate to severe mitral valve regurgitation. Mild to moderate mitral stenosis. Severe mitral annular calcification.  6. Sclerotic aortic valve with low flow low gradient aortic stenosis, probably mild to moderate.. The aortic valve is tricuspid.  7. Aortic dilatation noted. There is mild dilatation of the aortic root, measuring 44 mm. FINDINGS  Left Ventricle: Left ventricular ejection fraction, by estimation, is 25 to 30%. The left ventricle has severely decreased function. The left ventricle demonstrates global hypokinesis. There is mild left ventricular hypertrophy. Left ventricular  diastolic parameters are indeterminate. Right Ventricle: The right ventricular size is mildly enlarged. No increase in right ventricular wall thickness. Right ventricular systolic function is moderately reduced. There is severely elevated pulmonary artery systolic pressure. The tricuspid regurgitant velocity is 4.06 m/s, and with an assumed right atrial pressure of 8 mmHg, the estimated right ventricular systolic pressure is 73.9 mmHg. Left Atrium: Left atrial size was mildly dilated. Right Atrium: Right atrial size was moderately dilated. Pericardium: There is no evidence of pericardial effusion. Mitral Valve: Eccentric mitral regurgitation which can  be underestimated on this study. Recommend TEE for further evaluation. There is mild thickening of the mitral valve leaflet(s). There is mild calcification of the mitral valve leaflet(s). Severe mitral annular calcification. Moderate to severe mitral valve regurgitation, with anteriorly-directed jet. Mild to moderate mitral valve stenosis. MV peak gradient, 14.7 mmHg. The mean mitral valve gradient is 8.4 mmHg. Tricuspid Valve: The tricuspid valve is normal in structure. Tricuspid valve regurgitation is mild. Aortic Valve: Sclerotic aortic valve with low flow low gradient aortic stenosis, probably mild to moderate. The aortic valve is tricuspid. Aortic valve mean gradient measures 10.0 mmHg. Aortic valve peak gradient measures 16.4 mmHg. Aortic valve area, by  VTI measures 1.43 cm. Pulmonic Valve: The pulmonic valve was not well visualized. Pulmonic valve regurgitation is trivial. Aorta: Aortic dilatation noted. There is mild dilatation of the aortic root, measuring 44 mm. IAS/Shunts: The interatrial septum was not well visualized.  LEFT VENTRICLE PLAX 2D LVIDd:         5.50 cm   Diastology LVIDs:         4.50 cm   LV e' medial:    5.87 cm/s LV PW:         1.60 cm   LV E/e' medial:  22.5 LV IVS:        1.50 cm   LV e' lateral:   10.80 cm/s LVOT diam:     2.30 cm   LV E/e' lateral: 12.2 LV SV:         53 LV SV Index:   21 LVOT Area:     4.15 cm  RIGHT VENTRICLE            IVC RV S prime:     8.59 cm/s  IVC diam: 3.00 cm TAPSE (M-mode): 1.6 cm LEFT ATRIUM              Index        RIGHT ATRIUM           Index LA diam:        5.90 cm  2.33 cm/m   RA Area:     28.30 cm LA Vol (A2C):   114.0 ml 45.11 ml/m  RA Volume:   112.00 ml 44.32 ml/m LA Vol (A4C):   79.7 ml  31.54 ml/m LA Biplane Vol: 95.1 ml  37.63 ml/m  AORTIC VALVE AV Area (Vmax):    1.77 cm AV Area (Vmean):   1.56 cm AV Area (VTI):     1.43 cm AV Vmax:           202.50 cm/s AV Vmean:          144.500 cm/s AV VTI:            0.375 m AV Peak Grad:       16.4 mmHg AV Mean Grad:      10.0 mmHg LVOT Vmax:         86.13 cm/s LVOT Vmean:  54.333 cm/s LVOT VTI:          0.129 m LVOT/AV VTI ratio: 0.34  AORTA Ao Asc diam: 3.80 cm MITRAL VALVE                TRICUSPID VALVE MV Area (PHT): 2.66 cm     TR Peak grad:   65.9 mmHg MV Area VTI:   1.36 cm     TR Vmax:        406.00 cm/s MV Peak grad:  14.7 mmHg MV Mean grad:  8.4 mmHg     SHUNTS MV Vmax:       1.91 m/s     Systemic VTI:  0.13 m MV Vmean:      134.8 cm/s   Systemic Diam: 2.30 cm MV Decel Time: 285 msec MV E velocity: 132.00 cm/s MV A velocity: 98.50 cm/s MV E/A ratio:  1.34 Lida Reeks Alluri Electronically signed by Joetta Mustache Signature Date/Time: 01/29/2024/1:23:56 PM    Final     Positive ROS: All other systems have been reviewed and were otherwise negative with the exception of those mentioned in the HPI and as above.  Physical Exam: General:  Alert, no acute distress Psychiatric:  Patient is competent for consent with normal mood and affect   Cardiovascular:  No pedal edema Respiratory:  No wheezing, non-labored breathing Neurologic:  Sensation intact distally Lymphatic:  No axillary or cervical lymphadenopathy  Orthopedic Exam:  Left lower extremity Chronic venous changes to the distal tibia ankle foot shiny erythema.  Dressings well-appearing on the foot with multiple missing toes no active bleeding or bruising noted.  Similar appearance to the contralateral lower extremity. Left knee has a moderate effusion Mildly tender to palpation along the joint line No erythema no increased warmth, actually colder compared to the left lower leg where the erythema  Compartments all soft No pain with micromotion of the knee pain with end range of motion 90 degrees and 10 degrees extension. Neurovascularly intact able to dorsiflex the foot   X-rays:  X-rays of the left knee AP and 2 obliques nonweightbearing images reviewed myself no radiology read at this time.  Show mild degenerative  changes with some sclerosis and joint space narrowing medial and lateral joint spaces.  Mild chondrocalcinosis.  No fractures or dislocations noted.  Assessment: Knee effusion rule out septic arthritis  Plan: I reviewed the clinical and radiographic findings with the patient.  Given his ongoing bacteremia and the new onset swelling in his left knee I do have concern that there could be an underlying septic arthritis in the left knee.  This would not rule out other potential bacterial sources given the ongoing wounds and both of his feet and his cardiac history.  We discussed doing a left knee aspiration to rule down infection.  After discussing risk and benefits of knee aspiration patient agreed with plan to move forward with the procedure.  Full procedure note below.  Cloudy fluid was encountered in the knee but not gross purulence.  Based on his exam this could either be laboratory or infectious.  I discussed with the patient that we will follow the cell count and culture data and make a decision on a potential left knee washout based on the results.  At this time patient does not appear septic he is not tachycardic and is afebrile.  We discussed the risks and benefits of knee arthroscopy should an elevated cell count or infection be found within the knee.  Will make a  decision on surgical intervention based on these results in the meantime we will keep the patient n.p.o. after midnight tonight.  Should we decide to move forward with a knee arthroscopy I would do tomorrow morning early and we would need to have the patient off the heparin  drip for 4 hours prior to the procedure.  I will tentatively hold spot from the operating room at 7 AM I will make a decision whether or not to move forward with the OR based on the cell count data.  If the cell count is significantly above 50,000 cells we would likely move forward with surgical intervention.  All questions answered the patient agrees with the above  plan.  Procedure Left knee aspiration After reviewing the risks and benefits of the left knee aspiration with the patient including but not limited to infection, damage to nerves, vessels, ligaments, tendons, articular cartilage, or other surrounding structures, patient agreed with the plan to move forward with left knee aspiration.  Left knee was prepped in usual sterile fashion Betadine and a timeout was performed confirming that the left knee was the correct knee.  4 cc of 1% plain lidocaine  were injected into the superior lateral left knee just into the skin utilizing 25 needle..  Time was given for the lidocaine  to take effect and then an 18-gauge needle was used to aspirate 50 cc of cloudy yellow fluid.  Sterile dressing and Ace wrap were applied.  Patient tolerated procedure well without any complications.     Venus Ginsberg MD  Beeper #:  [phone]  01/30/2024 4:51 PM

## 2024-01-30 NOTE — Progress Notes (Signed)
 PT Cancellation Note  Patient Details Name: Tyler [name] MRN: [id number] DOB: 03-26-1946   Cancelled Treatment:    Reason Eval/Treat Not Completed: Fatigue/lethargy limiting ability to participate. Upon entry to room pt upright in bed declining PT efforts. Reports he prefers earlier session and willing to participate tomorrow. Encouraged OOB mobility as pt is declining SNF and has a lot of steps and current functional deficits making home an unsafe situation. Pt understanding. Will re-attempt as able.    Marc Senior. Fairly IV, PT, DPT Physical Therapist- [place]  [facility]  01/30/2024, 3:04 PM

## 2024-01-31 ENCOUNTER — Telehealth (HOSPITAL_COMMUNITY): Payer: Self-pay | Admitting: Pharmacy Technician

## 2024-01-31 ENCOUNTER — Encounter: Admission: EM | Disposition: E | Payer: Self-pay | Source: Home / Self Care | Attending: Internal Medicine

## 2024-01-31 ENCOUNTER — Other Ambulatory Visit (HOSPITAL_COMMUNITY): Payer: Self-pay

## 2024-01-31 DIAGNOSIS — Z515 Encounter for palliative care: Secondary | ICD-10-CM | POA: Diagnosis not present

## 2024-01-31 DIAGNOSIS — J9611 Chronic respiratory failure with hypoxia: Secondary | ICD-10-CM | POA: Diagnosis not present

## 2024-01-31 DIAGNOSIS — B951 Streptococcus, group B, as the cause of diseases classified elsewhere: Secondary | ICD-10-CM | POA: Diagnosis not present

## 2024-01-31 DIAGNOSIS — N179 Acute kidney failure, unspecified: Secondary | ICD-10-CM | POA: Diagnosis not present

## 2024-01-31 DIAGNOSIS — R7881 Bacteremia: Secondary | ICD-10-CM | POA: Diagnosis not present

## 2024-01-31 DIAGNOSIS — A401 Sepsis due to streptococcus, group B: Secondary | ICD-10-CM | POA: Diagnosis not present

## 2024-01-31 DIAGNOSIS — M25462 Effusion, left knee: Secondary | ICD-10-CM

## 2024-01-31 DIAGNOSIS — I214 Non-ST elevation (NSTEMI) myocardial infarction: Secondary | ICD-10-CM | POA: Diagnosis not present

## 2024-01-31 DIAGNOSIS — M11262 Other chondrocalcinosis, left knee: Secondary | ICD-10-CM

## 2024-01-31 DIAGNOSIS — I5023 Acute on chronic systolic (congestive) heart failure: Secondary | ICD-10-CM | POA: Diagnosis not present

## 2024-01-31 LAB — GLUCOSE, CAPILLARY
Glucose-Capillary: 203 mg/dL — ABNORMAL HIGH (ref 70–99)
Glucose-Capillary: 176 mg/dL — ABNORMAL HIGH (ref 70–99)
Glucose-Capillary: 181 mg/dL — ABNORMAL HIGH (ref 70–99)
Glucose-Capillary: 151 mg/dL — ABNORMAL HIGH (ref 70–99)

## 2024-01-31 LAB — BASIC METABOLIC PANEL WITH GFR
Anion gap: 12 (ref 5–15)
BUN: 98 mg/dL — ABNORMAL HIGH (ref 8–23)
CO2: 23 mmol/L (ref 22–32)
Calcium: 8.4 mg/dL — ABNORMAL LOW (ref 8.9–10.3)
Chloride: 98 mmol/L (ref 98–111)
Creatinine, Ser: 2.13 mg/dL — ABNORMAL HIGH (ref 0.61–1.24)
GFR, Estimated: 31 mL/min — ABNORMAL LOW (ref >60)
Glucose, Bld: 189 mg/dL — ABNORMAL HIGH (ref 70–99)
Potassium: 3.4 mmol/L — ABNORMAL LOW (ref 3.5–5.1)
Sodium: 133 mmol/L — ABNORMAL LOW (ref 135–145)

## 2024-01-31 LAB — BLOOD GAS, VENOUS
Acid-Base Excess: 1 mmol/L (ref 0.0–2.0)
Bicarbonate: 26 mmol/L (ref 20.0–28.0)
O2 Saturation: 94.4 %
Patient temperature: 37
pCO2, Ven: 42 mmHg — ABNORMAL LOW (ref 44–60)
pH, Ven: 7.4 (ref 7.25–7.43)
pO2, Ven: 73 mmHg — ABNORMAL HIGH (ref 32–45)

## 2024-01-31 LAB — CBC
HCT: 30.7 % — ABNORMAL LOW (ref 39.0–52.0)
Hemoglobin: 9.6 g/dL — ABNORMAL LOW (ref 13.0–17.0)
MCH: 27.3 pg (ref 26.0–34.0)
MCHC: 31.3 g/dL (ref 30.0–36.0)
MCV: 87.2 fL (ref 80.0–100.0)
Platelets: 152 10*3/uL (ref 150–400)
RBC: 3.52 MIL/uL — ABNORMAL LOW (ref 4.22–5.81)
RDW: 16.3 % — ABNORMAL HIGH (ref 11.5–15.5)
WBC: 8.3 10*3/uL (ref 4.0–10.5)
nRBC: 0 % (ref 0.0–0.2)

## 2024-01-31 LAB — AMMONIA: Ammonia: 19 umol/L (ref 9–35)

## 2024-01-31 LAB — HEPARIN LEVEL (UNFRACTIONATED)
Heparin Unfractionated: >1.1 [IU]/mL — ABNORMAL HIGH (ref 0.30–0.70)
Heparin Unfractionated: 0.51 [IU]/mL (ref 0.30–0.70)

## 2024-01-31 SURGERY — ARTHROSCOPY, KNEE
Anesthesia: General | Site: Knee | Laterality: Left

## 2024-01-31 MED ORDER — MELATONIN 5 MG PO TABS: 5.0000 mg | ORAL_TABLET | Freq: Every evening | ORAL | Status: DC | PRN

## 2024-01-31 MED ORDER — HYDROCODONE-ACETAMINOPHEN 5-325 MG PO TABS
1.0000 | ORAL_TABLET | Freq: Once | ORAL | Status: AC
  Administered 2024-01-31: 1 via ORAL
  Filled 2024-01-31: qty 1

## 2024-01-31 MED ORDER — POTASSIUM CHLORIDE CRYS ER 20 MEQ PO TBCR: 40.0000 meq | EXTENDED_RELEASE_TABLET | ORAL | Status: DC

## 2024-01-31 MED ORDER — POTASSIUM CHLORIDE 10 MEQ/100ML IV SOLN
10.0000 meq | INTRAVENOUS | Status: AC
  Administered 2024-01-31 (×3): 10 meq via INTRAVENOUS
  Filled 2024-01-31 (×3): qty 100

## 2024-01-31 NOTE — Progress Notes (Signed)
 PROGRESS NOTE    Tyler [name]  HQI:[id number] DOB: May 07, 1946 DOA: 01/26/2024 PCP: McClanahan, Kyra, NP  Subjective: Pt seen and examined. Discussed with cards. They felt he was too altered today to proceed with TEE. Arousable but falls asleep quickly today. He did receive trazodone  last night  Had left knee arthrocentesis yesterday by ortho. Fluid analysis shows pseudogout of left knee.   Hospital Course: Taken from H&P.  Tyler [name] is a 78 y.o. 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  He was also experiencing 5/10, nonradiating dull chest pain with no other associated symptoms.  On presentation patient has mild tachycardia.  Labs with BUN of 41, creatinine 1.5, baseline seems to be around 1.3, CO2 of 17, troponin 69>.105 EKG shows A-fib with RVR, PVCs and LBBB. CT head was negative for any acute intracranial abnormality CXR with cardiomegaly and mild central congestion.  Patient was initially started on heparin  infusion and cardiology was consulted.  5/5: Vital stable.  Worsening leukocytosis at 21.9, platelet at 143, none anion gap metabolic acid with worsening of bicarb to 16, BNP significantly elevated at 4043. Troponin continued to increase, currently at 714. Cardiology started him on IV Bumex .  Echocardiogram ordered  5/6: Rapid response was called overnight when patient became very tachycardic and tachypneic, found to be febrile at 102.9.  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 Blood cultures started growing group B streptococcus this morning-antibiotics are being switched to ceftriaxone .  ID was consulted.  Patient also has a pacemaker in place. Rising troponin, maximum recorded at 1830, improving leukocytosis, potassium 3.3 and worsening creatinine at 2.05 so further IV Bumex  was held this morning.  HPI: Tyler [name] is a 78 y.o. Caucasian male with medical history significant for HFpEF, osteoarthritis, atrial fibrillation and flutter, coronary artery disease, stage III chronic kidney disease, dyslipidemia and hypertension, as well as type 2 diabetes mellitus, who presented to the emergency room with a cancer of dizziness, lightheadedness and confusion that started around 4 PM today when he came back home from work.  This was associated with chest pain and dyspnea.  He describes his chest pain as dull aching graded 5/10 in severity with no radiation, nausea or vomiting or diaphoresis or palpitations.  His symptoms later resolved spontaneously.    He feels tactile fever without chills.  No cough or wheezing or vomitus.  No leg pain or edema recent travel or surgery.  No dysuria, oliguria or hematuria or flank pain.  No bleeding diathesis.   The patient was started on IV heparin  per ACS protocol. He will be admitted to a cardiac telemetry observation bed for further evaluation and management.   Significant Events: Admitted 01/26/2024 for chest pain 01-27-2024 blood cx positive for Group B Strep agalactiae  Significant Labs: Na 137, k 4.2, CO2 of 17, BUN 41, Scr 1.5, glu 189 Ca 9.3, TP 71. Alb 3.8 WBC 18.2, HgB 11.5, plt 164 Tropoin I 69-->105-->326 A1c 6.9% BNP 4043 01-27-2024 Blood cx Group  B strep agalactiae  Significant Imaging Studies: CT head No acute intracranial process. 2. Mild diffuse atrophy and mild chronic small vessel ischemic  changes CXR Cardiomegaly with mild central congestion  CTPA Negative for acute pulmonary embolism. 2. Findings suggestive of CHF with mild pulmonary edema. Small right pleural effusion. 3. 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 Recommend annual imaging followup by CTA or MRA. 01-28-2024 echo LVEF 25%  Antibiotic Therapy: Anti-infectives (From admission, onward)    Start     Dose/Rate Route Frequency Ordered Stop   01/28/24 1600  vancomycin  (VANCOREADY) IVPB 2000 mg/400 mL  Status:  Discontinued        2,000 mg 200 mL/hr over 120 Minutes Intravenous Every 24 hours 01/27/24 2009 01/28/24 0844   01/28/24 1600  cefTRIAXone  (ROCEPHIN ) 2 g in sodium chloride  0.9 % 100 mL IVPB        2 g 200 mL/hr over 30 Minutes Intravenous Daily 01/28/24 0844     01/28/24 0800  ceFEPIme  (MAXIPIME ) 2 g in sodium chloride  0.9 % 100 mL IVPB  Status:  Discontinued        2 g 200 mL/hr over 30 Minutes Intravenous Every 12 hours 01/27/24 1950 01/28/24 0844   01/27/24 2030  ceFEPIme  (MAXIPIME ) 2 g in sodium chloride  0.9 % 100 mL IVPB        2 g 200 mL/hr over 30 Minutes Intravenous  Once 01/27/24 1942 01/27/24 2242   01/27/24 2030  metroNIDAZOLE  (FLAGYL ) IVPB 500 mg  Status:  Discontinued        500 mg 100 mL/hr over 60 Minutes Intravenous Every 12 hours 01/27/24 1942 01/28/24 0844   01/27/24 2030  vancomycin  (VANCOCIN ) IVPB 1000 mg/200 mL premix  Status:  Discontinued        1,000 mg 200 mL/hr over 60 Minutes Intravenous  Once 01/27/24 1942 01/27/24 1948   01/27/24 1945  vancomycin  (VANCOREADY) IVPB 2000 mg/400 mL        2,000 mg 200 mL/hr over 120 Minutes Intravenous  Once 01/27/24 1948 01/27/24 2304   01/26/24 2300  doxycycline  (VIBRA -TABS) tablet 100 mg  Status:  Discontinued        100 mg Oral 2 times daily 01/26/24 2254 01/26/24 2346       Procedures:   Consultants: Cardiology CHF EP/Cards ID    Assessment and Plan: * NSTEMI (non-ST elevated myocardial infarction) (HCC) On admission  through 01-28-2024 Concern of NSTEMI, type I versus type II Increasing troponin, currently at 714.  Continue to have some chest discomfort. Cardiology is on board -Continue with heparin  infusion - Likely will get ischemic workup after diuresing - Continue with aspirin , Plavix  and atorvastatin   01-29-2024 remains on IV heparin  gtts. No word yet from cards on ischemic eval.  01-30-2024 remains on IV heparin  gtts  01-31-2024 remains on IV heparin  gtts at discretion of cardiology service.  Septicemia due to group B Streptococcus (HCC) 01-29-2024 ID consulted. They want TEE to be performed due to high index of suspicion for endocarditis +/- PPM infection.  Cardiology deferring TEE until next week due to respiratory status.  01-30-2024 concern for endocarditis of valve or PPM leads.  01-31-2024 TEE canceled today by cards due to altered mental status. They will re-evaluate next week. Cards has stated pt is not a candidate for device extraction. Continue with IV ABX under the direction of ID service. Currently on IV rocephin  2 gram daily.  Acute kidney injury superimposed on stage 3a chronic kidney disease (HCC) - baseline scr 1.3  On admission through 01-28-2024. Worsening creatinine, AKI on chronic CKD stage IIIa. Worsening non-anion gap metabolic acidosis, multifactorial with AKI, CHF and also concern of sepsis. IV Bumex  was held by cardiology today -Giving gentle IV fluid with bicarb infusion -Monitor renal function -Avoid nephrotoxins  01-29-2024 worsening Scr today. Scr up to 2.33 today. Given his low EF I will avoid IVF for now. Cards gave him 160 mg of IV lasix  today.  Defer to cardiology to manage his AKI/CKD if they are continuing his diuresis.  01-30-2024 Scr 2.44 today.  Received 160 mg IV lasix . 2.3 liters in urine out yesterday. Pt states breathing is better. Defer to CHF team for further diuresis.  01-31-2024 scr 2.13, BUN 98 today.  has 4.7 liters in urine yesterday. Diuresis  management by cardiology team.  Appears further IV diuretics on hold by cardiology team.  Acute on chronic systolic CHF (congestive heart failure) (HCC) On admission through 01-28-2024 Patient with volume overload, positive JVD and elevated BNP at 4043 Echo cardiogram ordered-pending -Cardiology started him on IV Bumex  2 mg twice daily which was held today due to worsening creatinine -Advanced heart failure team consulted -Continue with metolazone  -Daily weight and BMP -Strict intake and output  01-29-2024 will let cards and CHF team manage pt's volume status with diuretics, etc.  01-30-2024 management per CHF team. Yesterday echo LVEF 25%  01-31-2024 has 4.7 liters in urine yesterday. Diuresis management by cardiology team. Appears further IV diuretics on hold by cardiology team.  Sepsis due to cellulitis [facility]) On admission through 01-28-2024 Patient met sepsis criteria after developing high-grade fever with tachycardia overnight.  Likely due to lower extremity cellulitis and bilateral multiple foot wounds. Received broad-spectrum antibiotics overnight Preliminary blood cultures with group B strep agalactia - De-escalate antibiotics to ceftriaxone  - ID was consulted - Echocardiogram pending  01-29-2024 ID consulting. Concern for potential endocarditis +/- PPM infection.   01-31-2024 TEE canceled today by cards due to altered mental status. They will re-evaluate next week. Cards has stated pt is not a candidate for device extraction. Continue with IV ABX under the direction of ID service. Currently on IV rocephin  2 gram daily.  01-30-2024 potential for TEE tomorrow. Decision will be made tomorrow AM. NPO after MN in case TEE can be performed tomorrow. Remains on IV ABX.   Pseudogout of left knee 01-31-2024 had left knee arthrocentesis by ortho yesterday. Fluid analysis shows pseudogout.  Color, Synovial YELLOW  Appearance-Synovial CLOUDY Abnormal   Crystals, Fluid EXTRACELLULAR  CALCIUM  PYROPHOSPHATE CRYSTALS  Comment: INTRACELLULAR CALCIUM  PYROPHOSPHATE CRYSTALS  WBC, Synovial 24,791 High   Neutrophil, Synovial 93 High   Lymphocytes-Synovial Fld 1  Monocyte-Macrophage-Synovial Fluid 6 Low   Eosinophils-Synovial 0    Pressure injury of skin Agree with RN documentation regarding pressure injury to skin.  Pressure Injury 01/27/24 Heel Right Unstageable - Full thickness tissue loss in which the base of the injury is covered by slough (yellow, tan, gray, green or brown) and/or eschar (tan, brown or black) in the wound bed. (Active)  01/27/24 2000  Location: Heel  Location Orientation: Right  Staging: Unstageable - Full thickness tissue loss in which the base of the injury is covered by slough (yellow, tan, gray, green or brown) and/or eschar (tan, brown or black) in the wound bed.  Wound Description (Comments):   Present on Admission: Yes     Pressure Injury 01/27/24 Heel Right Stage 2 -  Partial thickness loss of dermis presenting as a shallow open injury with a red,  pink wound bed without slough. (Active)  01/27/24 2000  Location: Heel  Location Orientation: Right  Staging: Stage 2 -  Partial thickness loss of dermis presenting as a shallow open injury with a red, pink wound bed without slough.  Wound Description (Comments):   Present on Admission: Yes     Pressure Injury 01/27/24 Heel Left Stage 2 -  Partial thickness loss of dermis presenting as a shallow open injury with a red, pink wound bed without slough. (Active)  01/27/24 2000  Location: Heel  Location Orientation: Left  Staging: Stage 2 -  Partial thickness loss of dermis presenting as a shallow open injury with a red, pink wound bed without slough.  Wound Description (Comments):   Present on Admission: Yes      Essential hypertension On admission through 01-28-2024 Blood pressure within goal, being maintained with diuretics. Not on any other antihypertensives at home. -Continue to  monitor  01-30-2024 stable. HTN meds on hold due to borderline low BP  01-31-2024 stable. HTN meds still on hold due to borderline low BP.  Dyslipidemia On admission through 01-28-2024. Continue atorvastatin   01-30-2024 stable.  01-31-2024 stable  Type II diabetes mellitus with renal manifestations (HCC) On admission through 01-28-2024 Will place the patient on supplement coverage with NovoLog . - Holding home glipizide  and metformin .  01-29-2024 stable.  01-30-2024 stable.  01-31-2024 stable   DVT prophylaxis: Place TED hose Start: 01/29/24 0753  IV Heparin    Code Status: Full Code Family Communication: called pt's dtr [name] and gave her update Disposition Plan: unknown Reason for continuing need for hospitalization: remains on IV ABX and IV heparin .  Objective: Vitals:   01/31/24 0508 01/31/24 0736 01/31/24 1156 01/31/24 1158  BP:  (!) 106/59 108/70 116/60  Pulse:  89 84 84  Resp:      Temp:  97.9 F (36.6 C) (!) 97.5 F (36.4 C) (!) 97.5 F (36.4 C)  TempSrc:      SpO2:  97% 97% 98%  Weight: 103 kg     Height:        Intake/Output Summary (Last 24 hours) at 01/31/2024 1409 Last data filed at 01/31/2024 0859 Gross per 24 hour  Intake 950.8 ml  Output 5650 ml  Net -4699.2 ml   Filed Weights   01/29/24 0530 01/30/24 0518 01/31/24 0508  Weight: 107.5 kg 107.3 kg 103 kg    Examination:  Physical Exam Vitals and nursing note reviewed.  Constitutional:      General: He is not in acute distress.    Appearance: He is not toxic-appearing.     Comments: Arousable but falls asleep quickly today. He did receive trazodone  last night  Chronically ill appearing  HENT:     Head: Normocephalic and atraumatic.     Nose: Nose normal.  Cardiovascular:     Rate and Rhythm: Normal rate and regular rhythm. Frequent Extrasystoles are present. Pulmonary:     Effort: Pulmonary effort is normal.     Breath sounds: Normal breath sounds.  Abdominal:     General: Bowel  sounds are normal.     Palpations: Abdomen is soft.  Musculoskeletal:     Comments: Multiple wounds on his legs/feet covered in bandages  Skin:    Capillary Refill: Capillary refill takes less than 2 seconds.  Neurological:     Comments: Arousable but falls asleep quickly.     Data Reviewed: I have personally reviewed following labs and imaging studies  CBC: Recent Labs  Lab 01/26/24 1936  01/27/24 0151 01/28/24 0401 01/29/24 0701 01/30/24 0248 01/31/24 0612  WBC 18.2* 21.9* 13.0* 9.4 8.3 8.3  NEUTROABS 16.2*  --   --   --   --   --   HGB 11.5* 11.0* 10.3* 10.4* 9.7* 9.6*  HCT 36.3* 34.6* 32.6* 32.4* 30.4* 30.7*  MCV 88.5 87.8 87.4 85.9 86.1 87.2  PLT 164 143* 117* 120* 123* 152   Basic Metabolic Panel: Recent Labs  Lab 01/27/24 2043 01/28/24 0401 01/29/24 0701 01/30/24 0248 01/31/24 0612  NA 136 138 136 136 133*  K 4.0 3.3* 3.9 3.3* 3.4*  CL 108 105 103 102 98  CO2 19* 18* 20* 22 23  GLUCOSE 145* 120* 160* 176* 189*  BUN 61* 61* 86* 98* 98*  CREATININE 1.82* 2.05* 2.33* 2.44* 2.13*  CALCIUM  8.8* 8.3* 8.3* 8.5* 8.4*  MG  --   --   --  2.4  --    GFR: Estimated Creatinine Clearance: 36.4 mL/min (A) (by C-G formula based on SCr of 2.13 mg/dL (H)). Liver Function Tests: Recent Labs  Lab 01/26/24 1936 01/27/24 2043  AST 24 28  ALT 24 22  ALKPHOS 78 58  BILITOT 0.8 1.2  PROT 7.1 6.9  ALBUMIN 3.8 3.2*    Recent Labs  Lab 01/31/24 0956  AMMONIA 19   Coagulation Profile: Recent Labs  Lab 01/27/24 2043  INR 1.5*   BNP (last 3 results) Recent Labs    09/11/23 1627 10/28/23 1107 01/27/24 0844  BNP 3,874.2* 754.7* 4,043.5*   CBG: Recent Labs  Lab 01/30/24 1237 01/30/24 1619 01/30/24 2203 01/31/24 0736 01/31/24 1157  GLUCAP 224* 188* 201* 203* 176*   Sepsis Labs: Recent Labs  Lab 01/27/24 1826 01/27/24 2043 01/27/24 2207  PROCALCITON  --  7.52  --   LATICACIDVEN 2.3* 1.5 1.4    Recent Results (from the past 240 hours)  Culture,  blood (x 2)     Status: Abnormal   Collection Time: 01/27/24  8:43 PM   Specimen: BLOOD  Result Value Ref Range Status   Specimen Description   Final    BLOOD BLOOD LEFT ARM LAC Performed at [facility], [address], [place], [place] [address]    Special Requests   Final    BOTTLES DRAWN AEROBIC AND ANAEROBIC Blood Culture adequate volume Performed at [facility], [address] [place], [place] [address]    Culture  Setup Time   Final    GRAM POSITIVE COCCI IN BOTH AEROBIC AND ANAEROBIC BOTTLES CRITICAL RESULT CALLED TO, READ BACK BY AND VERIFIED WITH: TREY GREENWOOD 01/28/24 0811 MW GRAM STAIN REVIEWED-AGREE WITH RESULT DRT    Culture (A)  Final    GROUP B STREP(S.AGALACTIAE)ISOLATED SUSCEPTIBILITIES PERFORMED ON PREVIOUS CULTURE WITHIN THE LAST 5 DAYS. Performed at [facility], [address] [place], [place] [address]    Report Status 01/30/2024 FINAL  Final  Culture, blood (x 2)     Status: Abnormal   Collection Time: 01/27/24  8:49 PM   Specimen: BLOOD  Result Value Ref Range Status   Specimen Description   Final    BLOOD BLOOD LEFT HAND [facility] Performed at [facility], [address] [place], [place] [address]    Special Requests   Final    BOTTLES DRAWN AEROBIC AND ANAEROBIC Blood Culture adequate volume Performed at [facility], [address] [place], [place] [address]    Culture  Setup Time   Final    Hillis Lu  POSITIVE COCCI IN BOTH AEROBIC AND ANAEROBIC BOTTLES Organism ID to follow CRITICAL RESULT CALLED TO, READ BACK BY AND VERIFIED WITH: TREY GREENWOOD 01/28/24 0811 MW GRAM STAIN REVIEWED-AGREE WITH RESULT DRT Performed at [facility], [address] [place], [place] [address]    Culture GROUP B STREP(S.AGALACTIAE)ISOLATED (A)  Final   Report Status 01/30/2024 FINAL  Final   Organism ID, Bacteria GROUP B STREP(S.AGALACTIAE)ISOLATED  Final      Susceptibility   Group b strep(s.agalactiae)isolated -  MIC*    CLINDAMYCIN RESISTANT Resistant     AMPICILLIN <=0.25 SENSITIVE Sensitive     ERYTHROMYCIN >=8 RESISTANT Resistant     VANCOMYCIN  0.5 SENSITIVE Sensitive     CEFTRIAXONE  <=0.12 SENSITIVE Sensitive     LEVOFLOXACIN  0.5 SENSITIVE Sensitive     PENICILLIN <=0.06 SENSITIVE Sensitive     * GROUP B STREP(S.AGALACTIAE)ISOLATED  Blood Culture ID Panel (Reflexed)     Status: Abnormal   Collection Time: 01/27/24  8:49 PM  Result Value Ref Range Status   Enterococcus faecalis NOT DETECTED NOT DETECTED Final   Enterococcus Faecium NOT DETECTED NOT DETECTED Final   Listeria monocytogenes NOT DETECTED NOT DETECTED Final   Staphylococcus species NOT DETECTED NOT DETECTED Final   Staphylococcus aureus (BCID) NOT DETECTED NOT DETECTED Final   Staphylococcus epidermidis NOT DETECTED NOT DETECTED Final   Staphylococcus lugdunensis NOT DETECTED NOT DETECTED Final   Streptococcus species DETECTED (A) NOT DETECTED Final    Comment: CRITICAL RESULT CALLED TO, READ BACK BY AND VERIFIED WITH: TREY GREENWOOD 01/28/24 0811 MW    Streptococcus agalactiae DETECTED (A) NOT DETECTED Final    Comment: CRITICAL RESULT CALLED TO, READ BACK BY AND VERIFIED WITH: TREY GREENWOOD 01/28/24 0811 MW    Streptococcus pneumoniae NOT DETECTED NOT DETECTED Final   Streptococcus pyogenes NOT DETECTED NOT DETECTED Final   A.calcoaceticus-baumannii NOT DETECTED NOT DETECTED Final   Bacteroides fragilis NOT DETECTED NOT DETECTED Final   Enterobacterales NOT DETECTED NOT DETECTED Final   Enterobacter cloacae complex NOT DETECTED NOT DETECTED Final   Escherichia coli NOT DETECTED NOT DETECTED Final   Klebsiella aerogenes NOT DETECTED NOT DETECTED Final   Klebsiella oxytoca NOT DETECTED NOT DETECTED Final   Klebsiella pneumoniae NOT DETECTED NOT DETECTED Final   Proteus species NOT DETECTED NOT DETECTED Final   Salmonella species NOT DETECTED NOT DETECTED Final   Serratia marcescens NOT DETECTED NOT DETECTED Final    Haemophilus influenzae NOT DETECTED NOT DETECTED Final   Neisseria meningitidis NOT DETECTED NOT DETECTED Final   Pseudomonas aeruginosa NOT DETECTED NOT DETECTED Final   Stenotrophomonas maltophilia NOT DETECTED NOT DETECTED Final   Candida albicans NOT DETECTED NOT DETECTED Final   Candida auris NOT DETECTED NOT DETECTED Final   Candida glabrata NOT DETECTED NOT DETECTED Final   Candida krusei NOT DETECTED NOT DETECTED Final   Candida parapsilosis NOT DETECTED NOT DETECTED Final   Candida tropicalis NOT DETECTED NOT DETECTED Final   Cryptococcus neoformans/gattii NOT DETECTED NOT DETECTED Final    Comment: Performed at [facility], [address] [place], [place] [address]  MRSA Next Gen by PCR, Nasal     Status: None   Collection Time: 01/28/24  1:00 AM   Specimen: Nasal Mucosa; Nasal Swab  Result Value Ref Range Status   MRSA by PCR Next Gen NOT DETECTED NOT DETECTED Final    Comment: (NOTE) The GeneXpert MRSA Assay (FDA approved for NASAL specimens only), is one component of a comprehensive MRSA colonization surveillance  program. It is not intended to diagnose MRSA infection nor to guide or monitor treatment for MRSA infections. Test performance is not FDA approved in patients less than 76 years old. Performed at [facility], [address] [place], [place] [address]   Aerobic Culture w Gram Stain (superficial specimen)     Status: None (Preliminary result)   Collection Time: 01/29/24  6:35 AM   Specimen: Foot; Wound  Result Value Ref Range Status   Specimen Description   Final    FOOT RIGHT Performed at [facility], [address] [place], [place] [address]    Special Requests   Final    NONE Performed at [facility], [address] [place], [place] [address]    Gram Stain NO WBC SEEN RARE GRAM POSITIVE COCCI IN PAIRS   Final   Culture   Final    RARE STAPHYLOCOCCUS AUREUS CULTURE REINCUBATED FOR BETTER GROWTH Performed  at [facility], [address], [place], [place] [address]    Report Status PENDING  Incomplete  Aerobic Culture w Gram Stain (superficial specimen)     Status: None (Preliminary result)   Collection Time: 01/29/24  6:37 AM   Specimen: Foot; Wound  Result Value Ref Range Status   Specimen Description   Final    FOOT LEFT Performed at [facility], [address] [place], [place] [address]    Special Requests   Final    NONE Performed at [facility], [address] [place], [place] [address]    Gram Stain NO WBC SEEN RARE GRAM POSITIVE COCCI IN PAIRS   Final   Culture   Final    RARE STAPHYLOCOCCUS AUREUS RARE ENTEROCOCCUS FAECALIS SUSCEPTIBILITIES TO FOLLOW RARE GROUP B STREP(S.AGALACTIAE)ISOLATED TESTING AGAINST S. AGALACTIAE NOT ROUTINELY PERFORMED DUE TO PREDICTABILITY OF AMP/PEN/VAN SUSCEPTIBILITY. Performed at [facility], [address], [place], [place] [address]    Report Status PENDING  Incomplete  Culture, blood (Routine X 2) w Reflex to ID Panel     Status: None (Preliminary result)   Collection Time: 01/29/24  6:11 PM   Specimen: BLOOD  Result Value Ref Range Status   Specimen Description BLOOD BLOOD RIGHT HAND  Final   Special Requests   Final    BOTTLES DRAWN AEROBIC AND ANAEROBIC Blood Culture adequate volume   Culture   Final    NO GROWTH 2 DAYS Performed at [facility], [address], [place], [place] [address]    Report Status PENDING  Incomplete  Culture, blood (Routine X 2) w Reflex to ID Panel     Status: None (Preliminary result)   Collection Time: 01/29/24  7:42 PM   Specimen: BLOOD  Result Value Ref Range Status   Specimen Description BLOOD BLOOD RIGHT HAND  Final   Special Requests   Final    BOTTLES DRAWN AEROBIC AND ANAEROBIC Blood Culture adequate volume   Culture   Final    NO GROWTH 2 DAYS Performed at [facility], [address] [place], [place] [address]    Report Status  PENDING  Incomplete  Body fluid culture w Gram Stain     Status: None (Preliminary result)   Collection Time: 01/30/24  4:00 PM   Specimen: Synovium; Body Fluid  Result Value Ref Range Status   Specimen Description   Final    SYNOVIAL Performed at [facility], [address], [place], [place] [address]    Special  Requests   Final    L KNEE Performed at [facility], [address] [place], [place] [address]    Gram Stain   Final    MODERATE WBC PRESENT, PREDOMINANTLY PMN NO ORGANISMS SEEN    Culture   Final    NO GROWTH < 12 HOURS Performed at [facility], [address], [place], [place] [address]    Report Status PENDING  Incomplete     Radiology Studies: DG Knee 1-2 Views Left Result Date: 01/30/2024 CLINICAL DATA:  Knee effusion. EXAM: LEFT KNEE - 2 VIEW COMPARISON:  None Available. FINDINGS: Osteopenia. Joint space loss mildly of the mediolateral compartment and patellofemoral joint with scattered osteophyte formation. Scattered vascular calcifications are identified. There is some chondrocalcinosis as well of the mediolateral compartments. Subchondral cyst formation along the medial tibial plateau. The lateral view are oblique limiting evaluation. No large effusion but evaluation of a a small effusion is limited. Repeat lateral view could be considered versus additional imaging. IMPRESSION: Degenerative changes. Osteopenia. Chondrocalcinosis. No large effusion but lateral views are limited due to rotation. Subtle effusion is not excluded. Further workup as clinically appropriate Electronically Signed   By: Adrianna Horde M.D.   On: 01/30/2024 16:58   US  EKG SITE RITE Result Date: 01/29/2024 If Site Rite image not attached, placement could not be confirmed due to current cardiac rhythm.   Scheduled Meds:  vitamin C   500 mg Oral BID   aspirin  EC  81 mg Oral Daily   atorvastatin   80 mg Oral q1800   Chlorhexidine  Gluconate Cloth  6 each Topical Daily    feeding supplement (NEPRO CARB STEADY)  237 mL Oral BID BM   gentamicin  cream  1 Application Topical BID   insulin  aspart  0-15 Units Subcutaneous TID WC   insulin  aspart  0-5 Units Subcutaneous QHS   multivitamin with minerals  1 tablet Oral Daily   silver  sulfADIAZINE    Topical Daily   Continuous Infusions:  cefTRIAXone  (ROCEPHIN )  IV Stopped (01/30/24 1718)   heparin  2,300 Units/hr (01/31/24 0654)     LOS: 3 days   Time spent: 50 minutes  Unk Garb, DO  [facility]  01/31/2024, 2:09 PM

## 2024-01-31 NOTE — Telephone Encounter (Signed)
 Patient Product/process development scientist completed.    The patient is insured through [facility]. Patient has [employer], may use a copay card, and/or apply for patient assistance if available.    Ran test claim for Xarelto 20 mg and the current 30 day co-pay is $50.00.   This test claim was processed through [facility]- copay amounts may vary at other pharmacies due to pharmacy/plan contracts, or as the patient moves through the different stages of their insurance plan.     Morgan Arab, CPHT Pharmacy Technician III Certified Patient Advocate [facility] Patient Advocate Team Direct Number: [phone]  Fax: [phone]

## 2024-01-31 NOTE — Progress Notes (Signed)
   PT consult having difficulty keeping pt awake. Will stop at bedtime trazodone  to see if this helps.  Also check VBG to monitor PCO2.  Unk Garb, DO [facility]

## 2024-01-31 NOTE — Progress Notes (Signed)
 OT Cancellation Note  Patient Details Name: OBE CORZO MRN: [id number] DOB: Dec 09, 1945   Cancelled Treatment:    Reason Eval/Treat Not Completed: Other (comment) (Per discussion with PT, pt unable to stay awake to safely participate in therapy. OT will re attempt as able.Gerre Kraft, OTD OTR/L  01/31/24, 3:29 PM

## 2024-01-31 NOTE — Progress Notes (Signed)
 SLP Cancellation Note  Patient Details Name: Deaaron Payant Stroupe MRN: [id number] DOB: 08/28/1946   Cancelled treatment:       Reason Eval/Treat Not Completed: Patient at procedure or test/unavailable (per chart, pt NPO then out of room for procedure this morning. Will f/u tomorrow.)    Darla Edward, MS, CCC-SLP Speech Language Pathologist Rehab Services; [facility] [phone] (ascom) Jagger Beahm 01/31/2024, 3:55 PM

## 2024-01-31 NOTE — Progress Notes (Signed)
 Advanced Heart Failure Rounding Note  Cardiologist: Janette Medley, MD  Chief Complaint: Heart failure, NSTEMI Subjective:    Remains on IV lasix  160 IV bid + diamox /metolazone   Nearly 5L out. Weight down 9 pounds overnight,   Remains on IV abx. AF.   Denies CP or SOB  SCr improving   Objective:   Weight Range: 103 kg Body mass index is 26.24 kg/m.   Vital Signs:   Temp:  [97.9 F (36.6 C)-98.7 F (37.1 C)] 97.9 F (36.6 C) (05/09 0736) Pulse Rate:  [48-94] 89 (05/09 0736) Resp:  [13-25] 21 (05/09 0000) BP: (92-116)/(59-94) 106/59 (05/09 0736) SpO2:  [95 %-99 %] 97 % (05/09 0736) Weight:  [103 kg] 103 kg (05/09 0508) Last BM Date : 01/26/24  Weight change: Filed Weights   01/29/24 0530 01/30/24 0518 01/31/24 0508  Weight: 107.5 kg 107.3 kg 103 kg    Intake/Output:   Intake/Output Summary (Last 24 hours) at 01/31/2024 0806 Last data filed at 01/31/2024 0514 Gross per 24 hour  Intake 950.8 ml  Output 4700 ml  Net -3749.2 ml    Physical Exam   General:  Lying in bed No resp difficulty. Lethargic but answers questions and follows commands HEENT: normal Neck: supple. nJVP 7-8 Carotids 2+ bilat; no bruits. No lymphadenopathy or thryomegaly appreciated. Cor: Irregular rate & rhythm. No rubs, gallops or murmurs. Lungs: clear Abdomen: soft, nontender, nondistended. No hepatosplenomegaly. No bruits or masses. Good bowel sounds. Extremities: no cyanosis, clubbing, rash, tr-1+ edema  wounds and gauze Neuro: Lethargic but answers questions and follows commands non-foca   Telemetry   AF 80-90s + nsvt Personally reviewed  Labs    CBC Recent Labs    01/30/24 0248 01/31/24 0612  WBC 8.3 8.3  HGB 9.7* 9.6*  HCT 30.4* 30.7*  MCV 86.1 87.2  PLT 123* 152   Basic Metabolic Panel Recent Labs    84/69/62 0248 01/31/24 0612  NA 136 133*  K 3.3* 3.4*  CL 102 98  CO2 22 23  GLUCOSE 176* 189*  BUN 98* 98*  CREATININE 2.44* 2.13*  CALCIUM  8.5* 8.4*   MG 2.4  --    Liver Function Tests No results for input(s): "AST", "ALT", "ALKPHOS", "BILITOT", "PROT", "ALBUMIN" in the last 72 hours.  No results for input(s): "LIPASE", "AMYLASE" in the last 72 hours. Cardiac Enzymes No results for input(s): "CKTOTAL", "CKMB", "CKMBINDEX", "TROPONINI" in the last 72 hours.  BNP: BNP (last 3 results) Recent Labs    09/11/23 1627 10/28/23 1107 01/27/24 0844  BNP 3,874.2* 754.7* 4,043.5*    ProBNP (last 3 results) Recent Labs    10/02/23 1111  PROBNP 3,757*     D-Dimer No results for input(s): "DDIMER" in the last 72 hours. Hemoglobin A1C No results for input(s): "HGBA1C" in the last 72 hours.  Fasting Lipid Panel No results for input(s): "CHOL", "HDL", "LDLCALC", "TRIG", "CHOLHDL", "LDLDIRECT" in the last 72 hours. Thyroid Function Tests No results for input(s): "TSH", "T4TOTAL", "T3FREE", "THYROIDAB" in the last 72 hours.  Invalid input(s): "FREET3"  Other results:   Imaging    DG Knee 1-2 Views Left Result Date: 01/30/2024 CLINICAL DATA:  Knee effusion. EXAM: LEFT KNEE - 2 VIEW COMPARISON:  None Available. FINDINGS: Osteopenia. Joint space loss mildly of the mediolateral compartment and patellofemoral joint with scattered osteophyte formation. Scattered vascular calcifications are identified. There is some chondrocalcinosis as well of the mediolateral compartments. Subchondral cyst formation along the medial tibial plateau. The lateral view are  oblique limiting evaluation. No large effusion but evaluation of a a small effusion is limited. Repeat lateral view could be considered versus additional imaging. IMPRESSION: Degenerative changes. Osteopenia. Chondrocalcinosis. No large effusion but lateral views are limited due to rotation. Subtle effusion is not excluded. Further workup as clinically appropriate Electronically Signed   By: Adrianna Horde M.D.   On: 01/30/2024 16:58     Medications:   Scheduled Medications:  vitamin C    500 mg Oral BID   aspirin  EC  81 mg Oral Daily   atorvastatin   80 mg Oral q1800   Chlorhexidine  Gluconate Cloth  6 each Topical Daily   clopidogrel   75 mg Oral Q breakfast   feeding supplement (NEPRO CARB STEADY)  237 mL Oral BID BM   gentamicin  cream  1 Application Topical BID   insulin  aspart  0-15 Units Subcutaneous TID WC   insulin  aspart  0-5 Units Subcutaneous QHS   multivitamin with minerals  1 tablet Oral Daily   silver  sulfADIAZINE    Topical Daily    Infusions:  cefTRIAXone  (ROCEPHIN )  IV Stopped (01/30/24 1718)   furosemide  Stopped (01/30/24 1928)   heparin  2,300 Units/hr (01/31/24 0654)   potassium chloride       PRN Medications: acetaminophen  **OR** acetaminophen , magnesium  hydroxide, nitroGLYCERIN , ondansetron  **OR** ondansetron  (ZOFRAN ) IV, phenylephrine , traZODone   Patient Profile   Mr Lampkins is a 78 y/o male with a history of persistent atrial fibrillation 06/21 (not on anticoag), HTN, T2DM, CKD, cellulitis, hyperlipidemia, 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 Admitted with A/C HFrEF, sepsis and NSTEMI.   Assessment/Plan   Acute on chronic HFrEF - Echo 12/21 (at time of pacer ) EF 55-60% - Echo 07/03/23: EF 30-35% with moderate MR - NYHA IV on admission. Volume overloaded on exam 2/2 hx medication noncompliance, PAF, NSTEMI.  - Suspect drop in EF may be related to chronic RV pacing (less likely progression of CAD) - Massively volume overloaded after resuscitation for sepsis - ATN now resolving and diuresing well - Seems a bit uremic. Will hold diuresis today given possible uremia (d/w Dr. Bob Burn) - GDMT on hold with AKI - Ideally would have cath followed by device upgrade to CRT but not candidate at this point   NSTEMI - Denies new chest pain. Admitted with persistent 5/10  chest pain, has had it for years. - HsTrop [id number]. Suspect demand ischemia - No s/s angina - On ASA/plavix  - can drop plavix  - Not candidate for cath at this time   Sepsis 2/2 strep agalactiae bacteremia - multiple potential sources - Continue Rocephin   - UA with UTI with chronic foley+ wounds - Remains on IV abx. ID following.Surveillance bcx NGTD - Pending TEE to r/o vegetation/endocarditis. Currently not candidate for device extraction/upgrade (EP has seen) - TEE primarily to help determine length of VI abx - Continue IV abx for now  Persistent atrial fibrillation - Rate controlled. A fib in 80s-90s - Continue heparin  gtt - OnASA/Plavix  at home. Has refused home AC   AV block s/p MDT PPM  - Has not been compliant with EP follow ups.  - DIscussion as above. 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 Not candidate currently for CRT upgrade   AKI on CKD IIIa due to ATN - Baseline SCr ~1.2-1.5 - SCr 2.05>2.33>2.44 -> 2.13 today - Possible uremia today. Hold diuretics today  7. NSVT - Keep  K > 4.0 Mg > 2.0 - Can use amio as needed  8. GOC - Consider Pallaitive Care eval   AHF team will see again Monday. Please call me over the weekend with questions.   Length of Stay: 3  Jules Oar, MD  01/31/2024, 8:06 AM  Advanced Heart Failure Team Pager [phone] (M-F; 7a - 5p)  Please contact [facility] for night-coverage after hours (5p -7a ) and weekends on [email or web address]

## 2024-01-31 NOTE — Assessment & Plan Note (Signed)
 01-31-2024 had left knee arthrocentesis by ortho yesterday. Fluid analysis shows pseudogout.  Color, Synovial YELLOW  Appearance-Synovial CLOUDY Abnormal   Crystals, Fluid EXTRACELLULAR CALCIUM  PYROPHOSPHATE CRYSTALS  Comment: INTRACELLULAR CALCIUM  PYROPHOSPHATE CRYSTALS  WBC, Synovial 24,791 High   Neutrophil, Synovial 93 High   Lymphocytes-Synovial Fld 1  Monocyte-Macrophage-Synovial Fluid 6 Low   Eosinophils-Synovial 0   02-02-2024 stable.  02-03-2024 stable.

## 2024-01-31 NOTE — Progress Notes (Signed)
 Subjective: Patient seen and examined at bedside.  Reports significant improvement in pain and function of his left knee after the aspiration yesterday.  Denies any drainage from aspiration site, reports better motion.   Objective: Vital signs in last 24 hours: Temp:  [97.5 F (36.4 C)-98.3 F (36.8 C)] 97.5 F (36.4 C) (05/09 1158) Pulse Rate:  [61-94] 84 (05/09 1158) Resp:  [18-25] 21 (05/09 0000) BP: (92-116)/(59-75) 116/60 (05/09 1158) SpO2:  [97 %-98 %] 98 % (05/09 1158) Weight:  [161 kg] 103 kg (05/09 0508)  Intake/Output from previous day: 05/08 0701 - 05/09 0700 In: 950.8 [P.O.:240; I.V.:544.8; IV Piggyback:166] Out: 4700 [Urine:4700] Intake/Output this shift: Total I/O In: -  Out: 950 [Urine:950]  Recent Labs    01/29/24 0701 01/30/24 0248 01/31/24 0612  HGB 10.4* 9.7* 9.6*   Recent Labs    01/30/24 0248 01/31/24 0612  WBC 8.3 8.3  RBC 3.53* 3.52*  HCT 30.4* 30.7*  PLT 123* 152   Recent Labs    01/30/24 0248 01/31/24 0612  NA 136 133*  K 3.3* 3.4*  CL 102 98  CO2 22 23  BUN 98* 98*  CREATININE 2.44* 2.13*  GLUCOSE 176* 189*  CALCIUM  8.5* 8.4*   No results for input(s): "LABPT", "INR" in the last 72 hours.  Physical Exam: Left lower extremity. No erythema or significant effusion ROM 5-90 with minimal pain NVI distally with soft compartments No increased warmth, no pain with micromotion or gentle full ROM  Left knee aspiration  0 Result Notes    Component Ref Range & Units (hover) 1 d ago  Color, Synovial YELLOW  Appearance-Synovial CLOUDY Abnormal   Crystals, Fluid EXTRACELLULAR CALCIUM  PYROPHOSPHATE CRYSTALS  Comment: INTRACELLULAR CALCIUM  PYROPHOSPHATE CRYSTALS  WBC, Synovial 24,791 High   Neutrophil, Synovial 93 High   Lymphocytes-Synovial Fld 1  Monocyte-Macrophage-Synovial Fluid 6 Low   Eosinophils-Synovial 0  Comment: Performed at [facility], [address] [place], [place] [address]  Resulting Agency [facility]         Specimen Collected: 01/30/24 16:00 Last Resulted: 01/30/24 19:05    Assessment: Left knee effusion/Pseudogout  Plan: I reviewed the laboratory findings with the patient discussed treatment options.  Based on his aspiration results the effusion is consistent with an inflammatory process likely related to pseudogout.  This does not rule out potential superinfection however he had significant improvement in the knee after aspiration has no erythema at this time.  His motion is improved with minimal pain and I have low clinical suspicion for an ongoing infection of the knee at this time.  We will continue to follow the culture data and should something come positive we may consider washing out the knee.  All the patient's questions were answered and he agrees with the above plan.   Tyler [name] 01/31/2024, 12:39 PM

## 2024-01-31 NOTE — Progress Notes (Signed)
 PT Cancellation Note  Patient Details Name: Tyler [name] MRN: [id number] DOB: 09/01/1946   Cancelled Treatment:     PT attempt 2 x this date. Pt unable to stay awake to safely participate in therapy. Multiple attempts throughout the day but pt's poor level of arousal and falling asleep mid conversation. DO is aware.    Koleen Perna 01/31/2024, 3:09 PM

## 2024-01-31 NOTE — Progress Notes (Signed)
 [facility] CLINIC CARDIOLOGY PROGRESS NOTE       Patient ID: Tyler [name] MRN: [id number] DOB/AGE: 05-16-1946 78 y.o.  Admit date: 01/26/2024 Referring Physician Dr. Amalia Jung Primary Physician Jewell Mose, NP  Primary Cardiologist Dr. Bob Burn Reason for Consultation chest pain  HPI: Tyler [name] is a 78 y.o. male  with a past medical history of chronic HFrEF, persistent atrial fibrillation, mitral insufficiency, CHB s/p PPM 2021, hypertension, chronic kidney disease stage III who presented to the ED on 01/26/2024 for chest pain. Cardiology was consulted for further evaluation.   Interval history: -Patient seen and examined this morning. -Altered this AM, diuresed 4.7 L yesterday. BUN 98.  -Creatinine downtrending on AM labs. -Remains on heparin  gtt.  -Blood cx positive with strep agalactiae. ID recommending TEE.  -S/p knee aspiration yesterday with ortho, suspect pseudogout.  Review of systems complete and found to be negative unless listed above    Past Medical History:  Diagnosis Date   (HFpEF) heart failure with preserved ejection fraction (HCC) 03/01/2020   a.) TTE 03/01/2020: EF 55-60%, mod MAC, mod AoV sclerosis, triv AR, mild TR, mod MR, RVSP 50-59; b.) TTE 09/10/2020: EF 55-60%, mod MAC, mod AoV sclerosis, mild TR, 3+ MR, RVSP 50-59; c.) TTE 09/26/2020: EF 55-60%, mild LA dil, triv PR, mild MR/TR, RVSP 37-49   Adenoma of left adrenal gland    Anemia    Arthritis    Atrial fibrillation and flutter (HCC)    a.) CHA2DS2VASc = 5 (age x2, HFpEF, HTN, T2DM);  b.) s/p CTI ablation 09/07/2020; c.) rate/rhythm maintained on oral carvedilol ; not on chronic anticoagulation therapy   CAD (coronary artery disease)    Cardiomegaly    CKD (chronic kidney disease), stage III (HCC)    DOE (dyspnea on exertion)    Drug-induced bradycardia    Gangrene of toe of left foot (HCC)    a.) 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   H/O active rheumatic fever 08/22/2020    Hepatosplenomegaly    History of bilateral cataract extraction    HLD (hyperlipidemia)    Hypertension    Long term current use of aspirin     Lymphedema of both lower extremities    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)    a.) s/p amputation 11/04/2022   Peripheral vascular disease (HCC)    Pleural effusion on right 09/09/2020   a.) s/p RIGHT thoracentesis with 2180 cc yield   Pneumonia    Presence of permanent cardiac pacemaker 09/10/2020   a.) TVP placement 09/10/2020 due to intermittent CHB in setting of urosepsis; b.) s/p PPM placement 09/15/2020: MDT Azure XT DR (SN: [id number])   Pulmonary hypertension (HCC) 03/01/2020   a.) TTE: 03/01/2020: RVSP 50-59; b.) TTE 09/10/2020: RVSP 50-59; c.) TTE 09/26/2020: RVSP 37-49   RA (rheumatoid arthritis) (HCC)    Rheumatic fever    Sepsis (HCC) 09/10/2020   a.) urosepsis --> BC x 2 sets and UC all grew out significant Proteus mirabilis; admitted to [facility] 09/07/2020 - 09/27/2020.   Sick sinus syndrome [facility])    a.) s/p MDT PPM placement 09/15/2020   T2DM (type 2 diabetes mellitus) (HCC)    Urinary retention    chronic, with indwelling Foley catheter and plans for a suprapubic   Wears dentures    full upper    Past Surgical History:  Procedure Laterality Date   AMPUTATION TOE Right 11/04/2022   Procedure: AMPUTATION TOE;  Surgeon: Dot Gazella, [name];  Location:  [facility] ORS;  Service: Podiatry;  Laterality: Right;   AMPUTATION TOE Right 09/15/2023   Procedure: AMPUTATION TOE;  Surgeon: Jennefer Moats, [name];  [name];  Service: Orthopedics/Podiatry;  Laterality: Right;  Right hallux amputation   BONE BIOPSY Right 04/05/2023   Procedure: BONE BIOPSY THIRD & FOURTH;  Surgeon: Dot Gazella, [name];  [name];  Service: Podiatry;  Laterality: Right;   CARDIAC ELECTROPHYSIOLOGY STUDY AND ABLATION N/A 09/07/2020   Procedure: CARDIAC EP STUDY AND ABLATION (CTI)   CATARACT EXTRACTION W/PHACO Right  01/16/2017   Procedure: CATARACT EXTRACTION PHACO AND INTRAOCULAR LENS PLACEMENT (IOC)  Right Complicated;  Surgeon: Annell Kidney, MD;  Location: [facility];  Service: Ophthalmology;  Laterality: Right;  IVA Block Healon 5 malyugin vision blue Diabetic - oral meds   CATARACT EXTRACTION W/PHACO Left 10/23/2022   Procedure: CATARACT EXTRACTION PHACO AND INTRAOCULAR LENS PLACEMENT (IOC) LEFT DIABETIC;  Surgeon: Clair Crews, MD;  Location: [facility];  Service: Ophthalmology;  Laterality: Left;  Diabetic   COLONOSCOPY     INCISION AND DRAINAGE OF WOUND Left 09/15/2023   Procedure: IRRIGATION AND DEBRIDEMENT WOUND;  Surgeon: Jennefer Moats, [name];  [name];  Service: Orthopedics/Podiatry;  Laterality: Left;  Left foot wound debridement, possible graft/biopsy   LOWER EXTREMITY ANGIOGRAPHY Right 11/02/2022   Procedure: Lower Extremity Angiography;  Surgeon: Celso College, MD;  Location: [facility] INV[facility];  Service: Cardiovascular;  Laterality: Right;   LOWER EXTREMITY ANGIOGRAPHY Right 09/13/2023   Procedure: Lower Extremity Angiography;  Surgeon: Jackquelyn Mass, MD;  Location: [facility] INVASIVE CV LAB;  Service: Cardiovascular;  Laterality: Right;   METATARSAL HEAD EXCISION Left 07/05/2018   Procedure: RESECTION FIRST METATARSAL INFECTED BONE AND SOFT TISSUE;  Surgeon: Sharlyn Deaner, [name];  [name];  Service: Podiatry;  Laterality: Left;   METATARSAL HEAD EXCISION Right 04/05/2023   Procedure: METATARSAL HEAD EXCISION THIRD & FOURTH;  Surgeon: Dot Gazella, [name];  [name];  Service: Podiatry;  Laterality: Right;   PACEMAKER INSERTION  09/15/2020   TOE AMPUTATION Left    TONSILLECTOMY      Medications Prior to Admission  Medication Sig Dispense Refill Last Dose/Taking   acetaminophen  (TYLENOL ) 500 MG tablet Take 2 tablets (1,000 mg total) by mouth 3 (three) times daily as needed (for pain).   Taking As Needed   aspirin  EC 81 MG  tablet Take 1 tablet (81 mg total) by mouth daily. Swallow whole. 30 tablet 0 01/25/2024   atorvastatin  (LIPITOR ) 10 MG tablet Take 1 tablet (10 mg total) by mouth daily at 6 PM. 30 tablet 11 Taking   bumetanide  (BUMEX ) 2 MG tablet Take 1 tablet (2 mg total) by mouth daily. 90 tablet 3 Past Week   clopidogrel  (PLAVIX ) 75 MG tablet Take 1 tablet (75 mg total) by mouth daily with breakfast. 30 tablet 11 01/25/2024 Morning   Finerenone  (KERENDIA ) 10 MG TABS Take 1 tablet (10 mg total) by mouth daily. 30 tablet 5 Past Week   furosemide  (LASIX ) 40 MG tablet Take 40 mg by mouth 2 (two) times daily.   01/25/2024   gentamicin  cream (GARAMYCIN ) 0.1 % Apply 1 Application topically 2 (two) times daily. 30 g 1 Taking   glipiZIDE  (GLUCOTROL ) 10 MG tablet Take 1 tablet (10 mg total) by mouth 2 (two) times daily. 60 tablet 2 Past Month   losartan  (COZAAR ) 25 MG tablet Take 25 mg by mouth daily.   Taking   metolazone  (ZAROXOLYN ) 2.5 MG tablet Take  1 tablet (2.5 mg total) by mouth 2 (two) times a week. On Mondays & Fridays   Past Month   Multiple Vitamins-Minerals (MULTIVITAMIN WITH MINERALS) tablet Take 1 tablet by mouth daily.   01/25/2024   silver  sulfADIAZINE  (SILVADENE ) 1 % cream Apply to affected area daily 50 g 1 Taking   doxycycline  (VIBRA -TABS) 100 MG tablet Take 1 tablet (100 mg total) by mouth 2 (two) times daily. (Patient not taking: Reported on 01/26/2024) 20 tablet 0 Not Taking   metFORMIN  (GLUCOPHAGE ) 1000 MG tablet Take 1 tablet (1,000 mg total) by mouth 2 (two) times daily. (Patient not taking: Reported on 01/26/2024) 60 tablet 2 Not Taking   Social History   Socioeconomic History   Marital status: Single    Spouse name: Not on file   Number of children: Not on file   Years of education: Not on file   Highest education level: Not on file  Occupational History    Comment: [employer]  Tobacco Use   Smoking status: Never   Smokeless tobacco: Never  Vaping Use   Vaping status: Never Used   Substance and Sexual Activity   Alcohol use: Not Currently    Comment: rarely   Drug use: Never   Sexual activity: Not Currently    Birth control/protection: None  Other Topics Concern   Not on file  Social History Narrative   Lives with roommate   Social Drivers of Health   Financial Resource Strain: Low Risk  (03/20/2023)   Received from [facility], [facility]   Overall Financial Resource Strain (CARDIA)    Difficulty of Paying Living Expenses: Not hard at all  Food Insecurity: No Food Insecurity (01/27/2024)   Hunger Vital Sign    Worried About Running Out of Food in the Last Year: Never true    Ran Out of Food in the Last Year: Never true  Transportation Needs: No Transportation Needs (01/27/2024)   PRAPARE - Administrator, Civil Service (Medical): No    Lack of Transportation (Non-Medical): No  Physical Activity: Not on file  Stress: No Stress Concern Present (09/07/2020)   Received from [facility], [facility] of Occupational Health - Occupational Stress Questionnaire    Feeling of Stress : Not at all  Social Connections: Socially Isolated (01/27/2024)   Social Connection and Isolation Panel [NHANES]    Frequency of Communication with Friends and Family: More than three times a week    Frequency of Social Gatherings with Friends and Family: Twice a week    Attends Religious Services: Never    Database administrator or Organizations: No    Attends Banker Meetings: Never    Marital Status: Divorced  Catering manager Violence: Not At Risk (01/27/2024)   Humiliation, Afraid, Rape, and Kick questionnaire    Fear of Current or Ex-Partner: No    Emotionally Abused: No    Physically Abused: No    Sexually Abused: No    Family History  Problem Relation Age of Onset   Cancer Niece      Vitals:   01/31/24 0000 01/31/24 0100 01/31/24 0508 01/31/24 0736  BP: 114/65   (!) 106/59  Pulse: 91   89  Resp: (!) 21     Temp:   98 F (36.7 C)  97.9 F (36.6 C)  TempSrc:  Axillary    SpO2: 98%   97%  Weight:   103 kg   Height:  PHYSICAL EXAM General: chronically ill appearing elderly male, well nourished, in no acute distress. HEENT: Normocephalic and atraumatic. Neck: No JVD.  Lungs: Normal respiratory effort on room air.  Heart: HRRR. Normal S1 and S2 without gallops or murmurs.  Abdomen: Non-distended appearing.  Msk: Normal strength and tone for age. Extremities: Warm and well perfused. No clubbing, cyanosis. 1+ pitting edema bilaterally.  Neuro: Oriented X 1. Psych: Altered  Labs: Basic Metabolic Panel: Recent Labs    01/30/24 0248 01/31/24 0612  NA 136 133*  K 3.3* 3.4*  CL 102 98  CO2 22 23  GLUCOSE 176* 189*  BUN 98* 98*  CREATININE 2.44* 2.13*  CALCIUM  8.5* 8.4*  MG 2.4  --    Liver Function Tests: No results for input(s): "AST", "ALT", "ALKPHOS", "BILITOT", "PROT", "ALBUMIN" in the last 72 hours.  No results for input(s): "LIPASE", "AMYLASE" in the last 72 hours. CBC: Recent Labs    01/30/24 0248 01/31/24 0612  WBC 8.3 8.3  HGB 9.7* 9.6*  HCT 30.4* 30.7*  MCV 86.1 87.2  PLT 123* 152   Cardiac Enzymes: No results for input(s): "CKTOTAL", "CKMB", "CKMBINDEX", "TROPONINIHS" in the last 72 hours.  BNP: No results for input(s): "BNP" in the last 72 hours.  D-Dimer: No results for input(s): "DDIMER" in the last 72 hours. Hemoglobin A1C: No results for input(s): "HGBA1C" in the last 72 hours.  Fasting Lipid Panel: No results for input(s): "CHOL", "HDL", "LDLCALC", "TRIG", "CHOLHDL", "LDLDIRECT" in the last 72 hours. Thyroid Function Tests: No results for input(s): "TSH", "T4TOTAL", "T3FREE", "THYROIDAB" in the last 72 hours.  Invalid input(s): "FREET3" Anemia Panel: No results for input(s): "VITAMINB12", "FOLATE", "FERRITIN", "TIBC", "IRON", "RETICCTPCT" in the last 72 hours.   Radiology: DG Knee 1-2 Views Left Result Date: 01/30/2024 CLINICAL DATA:  Knee  effusion. EXAM: LEFT KNEE - 2 VIEW COMPARISON:  None Available. FINDINGS: Osteopenia. Joint space loss mildly of the mediolateral compartment and patellofemoral joint with scattered osteophyte formation. Scattered vascular calcifications are identified. There is some chondrocalcinosis as well of the mediolateral compartments. Subchondral cyst formation along the medial tibial plateau. The lateral view are oblique limiting evaluation. No large effusion but evaluation of a a small effusion is limited. Repeat lateral view could be considered versus additional imaging. IMPRESSION: Degenerative changes. Osteopenia. Chondrocalcinosis. No large effusion but lateral views are limited due to rotation. Subtle effusion is not excluded. Further workup as clinically appropriate Electronically Signed   By: Adrianna Horde M.D.   On: 01/30/2024 16:58   US  EKG SITE RITE Result Date: 01/29/2024 If Site Rite image not attached, placement could not be confirmed due to current cardiac rhythm.  ECHOCARDIOGRAM COMPLETE Result Date: 01/29/2024    ECHOCARDIOGRAM REPORT   Patient Name:   Tyler [name] Date of Exam: 01/28/2024 Medical Rec #:  [id number]      Height:       78.0 in Accession #:    [id number]     Weight:       260.8 lb Date of Birth:  1946/05/07      BSA:          2.527 m Patient Age:    77 years       BP:           103/75 mmHg Patient Gender: M              HR:           96 bpm. Exam Location:  [facility] Procedure: 2D Echo,  Cardiac Doppler and Color Doppler (Both Spectral and Color            Flow Doppler were utilized during procedure). Indications:     NSTEMI I21.4  History:         Patient has no prior history of Echocardiogram examinations,                  most recent 07/04/2023. CHF and Cardiomyopathy, Previous                  Myocardial Infarction, Pacemaker, PAD and CKD, stage 3,                  Arrythmias:Bradycardia, Atrial Fibrillation and Atrial Flutter,                  Signs/Symptoms:Dyspnea and Syncope; Risk  Factors:Diabetes,                  Hypertension and Dyslipidemia.  Sonographer:     Terrilee Few RCS Referring Phys:  [id number] Dortha Neighbors Diagnosing Phys: Lida Reeks Alluri IMPRESSIONS  1. Left ventricular ejection fraction, by estimation, is 25 to 30%. The left ventricle has severely decreased function. The left ventricle demonstrates global hypokinesis. There is mild left ventricular hypertrophy. Left ventricular diastolic parameters  are indeterminate.  2. Right ventricular systolic function is moderately reduced. The right ventricular size is mildly enlarged. There is severely elevated pulmonary artery systolic pressure. The estimated right ventricular systolic pressure is 73.9 mmHg.  3. Left atrial size was mildly dilated.  4. Right atrial size was moderately dilated.  5. Eccentric mitral regurgitation which can be underestimated on this study. Recommend TEE for further evaluation. Moderate to severe mitral valve regurgitation. Mild to moderate mitral stenosis. Severe mitral annular calcification.  6. Sclerotic aortic valve with low flow low gradient aortic stenosis, probably mild to moderate.. The aortic valve is tricuspid.  7. Aortic dilatation noted. There is mild dilatation of the aortic root, measuring 44 mm. FINDINGS  Left Ventricle: Left ventricular ejection fraction, by estimation, is 25 to 30%. The left ventricle has severely decreased function. The left ventricle demonstrates global hypokinesis. There is mild left ventricular hypertrophy. Left ventricular diastolic parameters are indeterminate. Right Ventricle: The right ventricular size is mildly enlarged. No increase in right ventricular wall thickness. Right ventricular systolic function is moderately reduced. There is severely elevated pulmonary artery systolic pressure. The tricuspid regurgitant velocity is 4.06 m/s, and with an assumed right atrial pressure of 8 mmHg, the estimated right ventricular systolic pressure is 73.9 mmHg. Left  Atrium: Left atrial size was mildly dilated. Right Atrium: Right atrial size was moderately dilated. Pericardium: There is no evidence of pericardial effusion. Mitral Valve: Eccentric mitral regurgitation which can be underestimated on this study. Recommend TEE for further evaluation. There is mild thickening of the mitral valve leaflet(s). There is mild calcification of the mitral valve leaflet(s). Severe mitral annular calcification. Moderate to severe mitral valve regurgitation, with anteriorly-directed jet. Mild to moderate mitral valve stenosis. MV peak gradient, 14.7 mmHg. The mean mitral valve gradient is 8.4 mmHg. Tricuspid Valve: The tricuspid valve is normal in structure. Tricuspid valve regurgitation is mild. Aortic Valve: Sclerotic aortic valve with low flow low gradient aortic stenosis, probably mild to moderate. The aortic valve is tricuspid. Aortic valve mean gradient measures 10.0 mmHg. Aortic valve peak gradient measures 16.4 mmHg. Aortic valve area, by  VTI measures 1.43 cm. Pulmonic Valve: The pulmonic valve was not well visualized. Pulmonic valve regurgitation  is trivial. Aorta: Aortic dilatation noted. There is mild dilatation of the aortic root, measuring 44 mm. IAS/Shunts: The interatrial septum was not well visualized.  LEFT VENTRICLE PLAX 2D LVIDd:         5.50 cm   Diastology LVIDs:         4.50 cm   LV e' medial:    5.87 cm/s LV PW:         1.60 cm   LV E/e' medial:  22.5 LV IVS:        1.50 cm   LV e' lateral:   10.80 cm/s LVOT diam:     2.30 cm   LV E/e' lateral: 12.2 LV SV:         53 LV SV Index:   21 LVOT Area:     4.15 cm  RIGHT VENTRICLE            IVC RV S prime:     8.59 cm/s  IVC diam: 3.00 cm TAPSE (M-mode): 1.6 cm LEFT ATRIUM              Index        RIGHT ATRIUM           Index LA diam:        5.90 cm  2.33 cm/m   RA Area:     28.30 cm LA Vol (A2C):   114.0 ml 45.11 ml/m  RA Volume:   112.00 ml 44.32 ml/m LA Vol (A4C):   79.7 ml  31.54 ml/m LA Biplane Vol: 95.1 ml   37.63 ml/m  AORTIC VALVE AV Area (Vmax):    1.77 cm AV Area (Vmean):   1.56 cm AV Area (VTI):     1.43 cm AV Vmax:           202.50 cm/s AV Vmean:          144.500 cm/s AV VTI:            0.375 m AV Peak Grad:      16.4 mmHg AV Mean Grad:      10.0 mmHg LVOT Vmax:         86.13 cm/s LVOT Vmean:        54.333 cm/s LVOT VTI:          0.129 m LVOT/AV VTI ratio: 0.34  AORTA Ao Asc diam: 3.80 cm MITRAL VALVE                TRICUSPID VALVE MV Area (PHT): 2.66 cm     TR Peak grad:   65.9 mmHg MV Area VTI:   1.36 cm     TR Vmax:        406.00 cm/s MV Peak grad:  14.7 mmHg MV Mean grad:  8.4 mmHg     SHUNTS MV Vmax:       1.91 m/s     Systemic VTI:  0.13 m MV Vmean:      134.8 cm/s   Systemic Diam: 2.30 cm MV Decel Time: 285 msec MV E velocity: 132.00 cm/s MV A velocity: 98.50 cm/s MV E/A ratio:  1.34 Joetta Mustache Electronically signed by Joetta Mustache Signature Date/Time: 01/29/2024/1:23:56 PM    Final    CT Angio Chest Pulmonary Embolism (PE) W or WO Contrast Result Date: 01/27/2024 CLINICAL DATA:  Chest pain.  PE suspected EXAM: CT ANGIOGRAPHY CHEST WITH CONTRAST TECHNIQUE: Multidetector CT imaging of the chest was performed using the standard protocol during bolus administration of intravenous  contrast. Multiplanar CT image reconstructions and MIPs were obtained to evaluate the vascular anatomy. RADIATION DOSE REDUCTION: This exam was performed according to the departmental dose-optimization program which includes automated exposure control, adjustment of the mA and/or kV according to patient size and/or use of iterative reconstruction technique. CONTRAST:  75mL OMNIPAQUE  IOHEXOL  350 MG/ML SOLN COMPARISON:  Same day chest radiograph FINDINGS: Cardiovascular: Cardiomegaly. Mitral annular calcification. Coronary artery and aortic atherosclerotic calcification. Mild dilation of the ascending aorta measuring 40 mm in diameter. No pericardial effusion. Negative for acute pulmonary embolism. Left chest wall pacemaker.  Mediastinum/Nodes: Trachea and esophagus are unremarkable. Shotty mediastinal lymph nodes are likely reactive. Lungs/Pleura: Small right pleural effusion. Interlobular septal thickening and hazy ground-glass opacities with a lower lung and posterior predominance. No focal consolidation, pleural effusion, or pneumothorax. Upper Abdomen: No acute abnormality. Musculoskeletal: No acute fracture. Review of the MIP images confirms the above findings. IMPRESSION: 1. Negative for acute pulmonary embolism. 2. Findings suggestive of CHF with mild pulmonary edema. Small right pleural effusion. 3. 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 Recommend annual imaging followup by CTA or MRA. This recommendation follows 2010 ACCF/AHA/AATS/ACR/ASA/SCA/SCAI/SIR/STS/SVM Guidelines for the Diagnosis and Management of Patients with Thoracic Aortic Disease. Circulation. 2010; 121: G387-F643. Aortic aneurysm NOS (ICD10-I71.9) 4. Aortic Atherosclerosis (ICD10-I70.0). Electronically Signed   By: Rozell Cornet M.D.   On: 01/27/2024 22:11   CT Head Wo Contrast Result Date: 01/26/2024 CLINICAL DATA:  Mental status change. EXAM: CT HEAD WITHOUT CONTRAST TECHNIQUE: Contiguous axial images were obtained from the base of the skull through the vertex without intravenous contrast. RADIATION DOSE REDUCTION: This exam was performed according to the departmental dose-optimization program which includes automated exposure control, adjustment of the mA and/or kV according to patient size and/or use of iterative reconstruction technique. COMPARISON:  Head CT 07/04/2018. FINDINGS: Brain: No evidence of acute infarction, hemorrhage, hydrocephalus, extra-axial collection or mass lesion/mass effect. There is mild diffuse atrophy and mild periventricular white matter hypodensity which is similar to the prior study. Vascular: Atherosclerotic calcifications are present within the cavernous internal carotid arteries. Skull: Normal. Negative for fracture or  focal lesion. Sinuses/Orbits: No acute finding. Other: None. IMPRESSION: 1. No acute intracranial process. 2. Mild diffuse atrophy and mild chronic small vessel ischemic changes. Electronically Signed   By: Tyron Gallon M.D.   On: 01/26/2024 20:18   DG Chest Portable 1 View Result Date: 01/26/2024 CLINICAL DATA:  Chest pain EXAM: PORTABLE CHEST 1 VIEW COMPARISON:  09/11/2023 FINDINGS: Left-sided pacing device similar in position. Cardiomegaly with mild central congestion. No consolidation, pleural effusion or pneumothorax. IMPRESSION: Cardiomegaly with mild central congestion. Electronically Signed   By: Esmeralda Hedge M.D.   On: 01/26/2024 20:14    ECHO as above  TELEMETRY reviewed by me 01/31/2024: ventricular pacing rate 80s  EKG reviewed by me: atrial fibrillation, rate 112 bpm PVCs.    Data reviewed by me 01/31/2024: last 24h vitals tele labs imaging I/O's hospitalist progress note, rapid response notes, ID notes, advanced heart failure notes, EP notes, ortho notes  Principal Problem:   NSTEMI (non-ST elevated myocardial infarction) (HCC) Active Problems:   Sepsis due to cellulitis (HCC)   Acute on chronic systolic CHF (congestive heart failure) (HCC)   Type II diabetes mellitus with renal manifestations (HCC)   Dyslipidemia   Essential hypertension   Acute kidney injury superimposed on stage 3a chronic kidney disease (HCC) - baseline scr 1.3   Pressure injury of skin   Septicemia due to group B Streptococcus (HCC)  Chronic respiratory failure with hypoxia (HCC)    ASSESSMENT AND PLAN:  Randey Lanctot Matranga is a 78 y.o. male  with a past medical history of chronic HFrEF, persistent atrial fibrillation, mitral insufficiency, CHB s/p PPM 2021, hypertension, chronic kidney disease stage III who presented to the ED on 01/26/2024 for chest pain. Cardiology was consulted for further evaluation.   # Severe sepsis # Strep agalactiae bacteremia Rapid response called overnight 5/6 for acute  decompensated respiratory status, tachycardia, fever.  Meets sepsis criteria. Blood cultures growing strep agalactiae with high bioburden.  No clear evidence of any vegetation on TTE. -ID following, appreciate recommendations.  -ID recommends TEE, defer today due to mental status.   # NSTEMI, type I vs type II # Acute on chronic HFrEF Patient reports to ED due to worsening SOB. Patient was seen outpatient cardiology on 09/2023 and there was plan for ischemic eval with PE stress test, however patient did not perform this exam or follow-up since this visit. Patient appears fluid overload on exam. Troponins elevated and trending 69 > 105 > 326 > 714 > 1300 > 1800. EKG in ED with AF PVCs with no acute ischemic changes, rate 112 bpm. Cr is elevated at 1.56 (baseline around 1.35). BP is stable. BNP elevated at 4043.  Rapid response called overnight 5/6 for acute decompensated respiratory status, tachycardia, fever.  Meets sepsis criteria.  Echo this admission with EF 25-30%, global hypokinesis, moderate RV dysfunction, severely elevated PASP, eccentric moderate to severe MR, low-flow low gradient mild to moderate aortic stenosis. -Given altered mental status, ammonia to be checked today. -Advanced heart failure following.  Appreciate recommendations. -Diuresis recommendations per advanced heart failure. Diuresed 4.7 L yesterday. -Home GDMT currently held given renal function, acute decompensated HF.  -Continue heparin  gtt.  -Continue ASA 81 mg, clopidogrel  75 mg, atorvastatin  80 mg daily. -Patient will eventually need ischemic evaluation.   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 Underwent PPM implant in 2021 for CHB, has had regular device checks since implant. EKG in ED and per tele this AM AF rates in 100s.  -Beta-blocker as above. Will resume for  rate control when patient is more stable from HF perspective. -Recommend anticoagulation however patient has historically deferred given prior bleeding issues. -EP consulted for further evaluation given high bioburden bacteremia and PPM in place. Appreciate their recommendations. May require extraction pending, they recommend TEE for further lead evaluation.   # Chronic kidney disease stage IIIa Patient with hx of CKD, baseline Cr 1.3-1.4. Cr this AM 2.13. BUN 98. -Continue to monitor renal function closely during diuresis. -Management per primary.  -Consider nephrology consult.  Patient with multiple co-morbidities, now declining mental status. Prognosis is guarded.    This patient's plan of care was discussed and created with Dr. Bob Burn and he is in agreement.  Signed: Hamp Levine, PA-C  01/31/2024, 7:39 AM [facility]

## 2024-01-31 NOTE — Inpatient Diabetes Management (Signed)
 Inpatient Diabetes Program Recommendations  AACE/ADA: New Consensus Statement on Inpatient Glycemic Control (2015)  Target Ranges:  Prepandial:   less than 140 mg/dL      Peak postprandial:   less than 180 mg/dL (1-2 hours)      Critically ill patients:  140 - 180 mg/dL    Latest Reference Range & Units 01/30/24 08:34 01/30/24 12:37 01/30/24 16:19 01/30/24 22:03  Glucose-Capillary 70 - 99 mg/dL 295 (H)  3 units Novolog   224 (H)  5 units Novolog   188 (H)  3 units Novolog   201 (H)  (H): Data is abnormally high  Latest Reference Range & Units 01/31/24 07:36  Glucose-Capillary 70 - 99 mg/dL 284 (H)  5 units Novolog    (H): Data is abnormally high     Home DM Meds: Glipizide  10 mg BID       Metformin  1000 mg BID (NOT taking)  Current Orders: Novolog  Moderate Correction Scale/ SSI (0-15 units) TID AC + HS     MD- Note CBGs are elevated.  Home DM meds are on hold.  May consider adding low dose weight based basal insulin : Semglee  5 units at bedtime (0.05 units/kg)    --Will follow patient during hospitalization--  Langston Pippins RN, MSN, CDCES Diabetes Coordinator Inpatient Glycemic Control Team Team Pager: [phone] (8a-5p)

## 2024-01-31 NOTE — Consult Note (Signed)
 Consultation Note Date: 01/31/2024   Patient Name: Tyler [name]  DOB: 1946/02/16  MRN: [id number]  Age / Sex: 78 y.o., male  PCP: Jewell Mose, NP Referring Physician: Unk Garb, DO  Reason for Consultation: Establishing goals of care   HPI/Brief Hospital Course: 78 y.o. male  with past medical history of HFpEF, A-fib/flutter, AV block s/p PPM (2021), CAD, CKD stage III, hypertension and hyperlipidemia admitted from home on 01/26/2024 with dizziness, lightheadedness and confusion.  Also reported dull chest pain.  EKG on arrival showed A-fib with RVR, PVCs and L BBB Elevated troponin and significantly elevated BNP Chest x-ray with cardiomegaly and congestion  During this admission found to have positive blood cultures growing group B streptococcus, ID consulted Concern for endocarditis and or pacing wire vegetation as well as concern for left knee septic arthritis S/p left knee aspiration  Advanced heart failure team also following TEE on hold due to AKI as well as AMS  Palliative medicine was consulted for assisting with goals of care conversations  Subjective:  Extensive chart review has been completed prior to meeting patient including labs, vital signs, imaging, progress notes, orders, and available advanced directive documents from current and previous encounters.  Visited with Mr. Early at his bedside.  He is resting in bed with eyes closed, does not open his eyes to calling of his name but does respond to gentle touch.  He is unable to appropriately answer orientation questions.  Easily drifts back off to sleep without constant redirection.  No family at bedside during time of visit.  Called and spoke with daughter-Tyler [name].  Introduced myself as a Publishing rights manager as a member of the palliative care team. Explained palliative medicine is specialized medical care for people living with serious illness. It focuses on providing relief from  the symptoms and stress of a serious illness. The goal is to improve quality of life for both the patient and the family.   Tyler [name] shares a brief life review of Tyler [name].  She shares he continues to work part-time at an [employer].  He lives in his home with a roommate and functions independently.  She shares when he is not working he lives a fairly sedentary lifestyle.  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  Tyler [name] has a sister/Carmel.  Tyler [name] shares her father and [name] do not have a close relationship.  Tyler [name] shares that she is the only family that is actively involved in his care and visits him.  Tyler [name] shares that she lives about an hour and a half away but talks to her father multiple times per week.  Tyler [name] shares prior to admission Tyler [name] did not have any type of cognitive impairment.  She shares in the past he has been septic before and it took quite a few days for his mentation to recover.  Provided Tyler [name] with medical updates and assessment of Tyler [name] today.  We discussed his drowsy nests with primary team planning to discontinue trazodone  at bedtime as well as ordering VBG and assessing for CO2 retention.  Tyler [name] shares she has been getting adequate updates from the medical team.  We discussed in depth his chronic medical conditions.  We discussed bacteremia being treated with appropriate IV antibiotics with concern of possible endocarditis or septic arthritis.  Results pending from left knee aspirate.  TEE being on hold due to declining mentation as well as AKI.  We discussed patient's current illness and what it means in  the larger context of patient's on-going co-morbidities. Natural disease trajectory and expectations at EOL were discussed.   Attempted to elicit goals of care.  Tyler [name] shares she believes Tyler [name] has completed advance directive documents in the past.  Tyler [name] does not have access to a copy.  She shares she  believes she has been appointed healthcare power of attorney.  Encouraged Tyler [name] to attempt to locate documents.  Discussed without documented HCPOA we would need to include her sister [name] in goals of care conversations if Tyler [name] remains cognitively impaired and cannot participate in his own conversations.  We discussed CODE STATUS and the difference between full code and DO NOT RESUSCITATE.  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  She shares Tyler [name] has had a cardiac arrest in the past and underwent CPR and other ACLS measures.  She shares at that time she recalls her father stating that he is not sure that he would want to go through that again and had considered changing his status to DO NOT RESUSCITATE but unsure if he actually made that change.  Shared with Tyler [name] the importance of having conversations with Tyler [name] if/when able or potentially having conversations with her sister regarding goals of care as Tyler [name] is not able to participate in conversations at this time.  I discussed importance of continued conversations with family/support persons and all members of their medical team regarding overall plan of care and treatment options ensuring decisions are in alignment with patients goals of care.  All questions/concerns addressed. Emotional support provided to patient/family/support persons. PMT will continue to follow and support patient as needed.   Objective: Primary Diagnoses: Present on Admission:  Type II diabetes mellitus with renal manifestations (HCC)  NSTEMI (non-ST elevated myocardial infarction) (HCC)  Acute on chronic systolic CHF (congestive heart failure) (HCC)  Sepsis due to cellulitis [facility])   Physical Exam Pulmonary:     Effort: Pulmonary effort is normal. No respiratory distress.  Abdominal:     Palpations: Abdomen is soft.  Skin:    General: Skin is warm and dry.     Findings:  Bruising present.  Neurological:     Mental Status: He is lethargic and disoriented.     Motor: Weakness present.     Vital Signs: BP 117/71 (BP Location: Right Arm)   Pulse 87   Temp 97.9 F (36.6 C)   Resp (!) 21   Ht 6\' 6"  (1.981 m)   Wt 103 kg   SpO2 98%   BMI 26.24 kg/m  Pain Scale: 0-10   Pain Score: 7    IO: Intake/output summary:  Intake/Output Summary (Last 24 hours) at 01/31/2024 1718 Last data filed at 01/31/2024 0981 Gross per 24 hour  Intake 458.25 ml  Output 3750 ml  Net -3291.75 ml    LBM: Last BM Date : 01/26/24 Baseline Weight: Weight: 118.3 kg Most recent weight: Weight: 103 kg      Assessment and Plan  SUMMARY OF RECOMMENDATIONS   Encouraged daughter to attempt to locate advanced directive documents Continued goals of care discussions needed PMD to follow-up on Sunday  Palliative Prophylaxis:   Bowel Regimen, Delirium Protocol and Frequent Pain Assessment   Thank you for this consult and allowing Palliative Medicine to participate in the care of Lambros A.  Vanes. Palliative medicine will continue to follow and assist as needed.   Time Total: 75 minutes  Time spent includes: Detailed review  of medical records (labs, imaging, vital signs), medically appropriate exam (mental status, respiratory, cardiac, skin), discussed with treatment team, counseling and educating patient, family and staff, documenting clinical information, medication management and coordination of care.   Signed by: Isadore Marble, DNP, AGNP-C Palliative Medicine    Please contact Palliative Medicine Team phone at [phone] for questions and concerns.  For individual provider: See Tilford Foley

## 2024-01-31 NOTE — Care Management Important Message (Signed)
 Important Message  Patient Details  Name: Tyler [name] MRN: [id number] Date of Birth: April 29, 1946   Important Message Given:  Yes - Medicare IM     Anise Kerns 01/31/2024, 4:15 PM

## 2024-01-31 NOTE — Progress Notes (Signed)
 ANTICOAGULATION CONSULT NOTE  Pharmacy Consult for heparin  infusion Indication: Atrial fibrillation  Allergies  Allergen Reactions   Quinolones Other (See Comments)    Patient has aortic aneurysm - fluoroquinolones are contraindicated due to risk of aortic rupture.   Eliquis  [Apixaban ]     Dizziness  and vision change   Spironolactone  Other (See Comments)    dizziness    Patient Measurements: Height: 6\' 6"  (198.1 cm) Weight: 103 kg (227 lb 1.2 oz) IBW/kg (Calculated) : 91.4 Heparin  Dosing Weight: 115.5 kg  Vital Signs: Temp: 97.9 F (36.6 C) (05/09 0736) Temp Source: Axillary (05/09 0100) BP: 106/59 (05/09 0736) Pulse Rate: 89 (05/09 0736)  Labs: Recent Labs    01/29/24 0701 01/30/24 0248 01/31/24 0612 01/31/24 0743  HGB 10.4* 9.7* 9.6*  --   HCT 32.4* 30.4* 30.7*  --   PLT 120* 123* 152  --   HEPARINUNFRC 0.45 0.44 >1.10* 0.51  CREATININE 2.33* 2.44* 2.13*  --     Estimated Creatinine Clearance: 36.4 mL/min (A) (by C-G formula based on SCr of 2.13 mg/dL (H)).   Medical History: Past Medical History:  Diagnosis Date   (HFpEF) heart failure with preserved ejection fraction (HCC) 03/01/2020   a.) TTE 03/01/2020: EF 55-60%, mod MAC, mod AoV sclerosis, triv AR, mild TR, mod MR, RVSP 50-59; b.) TTE 09/10/2020: EF 55-60%, mod MAC, mod AoV sclerosis, mild TR, 3+ MR, RVSP 50-59; c.) TTE 09/26/2020: EF 55-60%, mild LA dil, triv PR, mild MR/TR, RVSP 37-49   Adenoma of left adrenal gland    Anemia    Arthritis    Atrial fibrillation and flutter (HCC)    a.) CHA2DS2VASc = 5 (age x2, HFpEF, HTN, T2DM);  b.) s/p CTI ablation 09/07/2020; c.) rate/rhythm maintained on oral carvedilol ; not on chronic anticoagulation therapy   CAD (coronary artery disease)    Cardiomegaly    CKD (chronic kidney disease), stage III (HCC)    DOE (dyspnea on exertion)    Drug-induced bradycardia    Gangrene of toe of left foot (HCC)    a.) 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   H/O  active rheumatic fever 08/22/2020   Hepatosplenomegaly    History of bilateral cataract extraction    HLD (hyperlipidemia)    Hypertension    Long term current use of aspirin     Lymphedema of both lower extremities    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)    a.) s/p amputation 11/04/2022   Peripheral vascular disease (HCC)    Pleural effusion on right 09/09/2020   a.) s/p RIGHT thoracentesis with 2180 cc yield   Pneumonia    Presence of permanent cardiac pacemaker 09/10/2020   a.) TVP placement 09/10/2020 due to intermittent CHB in setting of urosepsis; b.) s/p PPM placement 09/15/2020: MDT Azure XT DR (SN: [id number])   Pulmonary hypertension (HCC) 03/01/2020   a.) TTE: 03/01/2020: RVSP 50-59; b.) TTE 09/10/2020: RVSP 50-59; c.) TTE 09/26/2020: RVSP 37-49   RA (rheumatoid arthritis) (HCC)    Rheumatic fever    Sepsis (HCC) 09/10/2020   a.) urosepsis --> BC x 2 sets and UC all grew out significant Proteus mirabilis; admitted to [facility] 09/07/2020 - 09/27/2020.   Sick sinus syndrome (HCC)    a.) s/p MDT PPM placement 09/15/2020   T2DM (type 2 diabetes mellitus) (HCC)    Urinary retention    chronic, with indwelling Foley catheter and plans for a suprapubic   Wears dentures    full upper  Assessment: Pt is a 78 yo male presenting to ED complaining of feeling of dizziness and confusion. Peak trop of 1.8K. No DOAC PTA. PMH: HFpEF, OA, afib/flutter, pacemaker, CAD, CKD 3, HLD, HTN, PAD, DM. Of note, patient has previously refused oral anticoagulants.    5/5 0844 HL 0.27    SUBtherapeutic 5/5 1826 HL 0.39    Therapeutic x 1 5/6 0401 HL 0.20    SUBtherapeutic  5/6 1340 HL 0.24    SUBtherapeutic 5/6 2256 HL 0.41    Therapeutic x 1  5/7 0701 HL 0.45    Therapeutic x 2 5/8 0248 HL 0.44    Therapeutic x 3  5/9 0743 HL 0.51    Therapeutic x 4  No bleeding noted. Hgb down slightly to 9.6 and plt count up to 152K. Initial check this AM was supratherapeutic (>1.1)  after being therapeutic for 3 days. Suspect it was an inappropriate draw, so heparin  level was repeat and returned therapeutic. Patient has been on triple therapy, however, clopidogrel  was stopped today.  Goal of Therapy:  Heparin  level 0.3-0.7 units/ml Monitor platelets by anticoagulation protocol: Yes   Plan:  - Will continue pt on current rate and recheck HL on 5/10 with AM labs. Can consider slight decrease in rate tomorrow if still slowly trending up. - CBC daily  Margean Sheehan, PharmD Clinical Pharmacist 01/31/2024

## 2024-01-31 NOTE — Progress Notes (Signed)
 Date of Admission:  01/26/2024      ID: Tyler [name] is a 78 y.o. 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)   Type II diabetes mellitus with renal manifestations (HCC)   Dyslipidemia   Essential hypertension   Acute kidney injury superimposed on stage 3a chronic kidney disease (HCC) - baseline scr 1.3   Pressure injury of skin   Septicemia due to group B Streptococcus (HCC)   Chronic respiratory failure with hypoxia (HCC)   Effusion of left knee    Subjective: Patient has some short of breath Left knee was aspirated this afternoon  Medications:   vitamin C   500 mg Oral BID   aspirin  EC  81 mg Oral Daily   atorvastatin   80 mg Oral q1800   Chlorhexidine  Gluconate Cloth  6 each Topical Daily   feeding supplement (NEPRO CARB STEADY)  237 mL Oral BID BM   gentamicin  cream  1 Application Topical BID   insulin  aspart  0-15 Units Subcutaneous TID WC   insulin  aspart  0-5 Units Subcutaneous QHS   multivitamin with minerals  1 tablet Oral Daily   silver  sulfADIAZINE    Topical Daily    Objective: Vital signs in last 24 hours: Patient Vitals for the past 24 hrs:  BP Temp Temp src Pulse Resp SpO2 Weight  01/31/24 1158 116/60 (!) 97.5 F (36.4 C) -- 84 -- 98 % --  01/31/24 1156 108/70 (!) 97.5 F (36.4 C) -- 84 -- 97 % --  01/31/24 0736 (!) 106/59 97.9 F (36.6 C) -- 89 -- 97 % --  01/31/24 0508 -- -- -- -- -- -- 103 kg  01/31/24 0100 -- 98 F (36.7 C) Axillary -- -- -- --  01/31/24 0000 114/65 -- -- 91 (!) 21 98 % --  01/30/24 2300 -- 97.9 F (36.6 C) Axillary -- -- -- --  01/30/24 2100 111/72 -- -- 92 (!) 25 98 % --  01/30/24 2000 102/75 98.2 F (36.8 C) Axillary 93 20 98 % --  01/30/24 1900 103/60 -- -- 94 (!) 24 98 % --       PHYSICAL EXAM:  General: Somnolent But responds to questions when woken up lungs:  Bilateral air entry.  crepts Heart: Irregular Abdomen: Soft, non-tender,not distended. Bowel sounds normal. No masses Extremities: Left knee mildly swollen compared to the right Restricted movement Edema legs Multiple wounds on both feet Skin: Venous stasis pigmentation extremities   lymph: Cervical, supraclavicular normal. Neurologic: Grossly non-focal  Lab Results    Latest Ref Rng & Units 01/31/2024    6:12 AM 01/30/2024    2:48 AM 01/29/2024    7:01 AM  CBC  WBC 4.0 - 10.5 K/uL 8.3  8.3  9.4   Hemoglobin 13.0 - 17.0 g/dL 9.6  9.7  16.1   Hematocrit 39.0 - 52.0 % 30.7  30.4  32.4   Platelets 150 - 400 K/uL 152  123  120        Latest Ref Rng & Units 01/31/2024    6:12 AM 01/30/2024    2:48 AM 01/29/2024    7:01 AM  CMP  Glucose 70 - 99 mg/dL 096  045  409   BUN 8 - 23 mg/dL 98  98  86   Creatinine 0.61 - 1.24 mg/dL 8.11  9.14  7.82   Sodium  135 - 145 mmol/L 133  136  136   Potassium 3.5 - 5.1 mmol/L 3.4  3.3  3.9   Chloride 98 - 111 mmol/L 98  102  103   CO2 22 - 32 mmol/L 23  22  20    Calcium  8.9 - 10.3 mg/dL 8.4  8.5  8.3       Microbiology:  Studies/Results: DG Knee 1-2 Views Left Result Date: 01/30/2024 CLINICAL DATA:  Knee effusion. EXAM: LEFT KNEE - 2 VIEW COMPARISON:  None Available. FINDINGS: Osteopenia. Joint space loss mildly of the mediolateral compartment and patellofemoral joint with scattered osteophyte formation. Scattered vascular calcifications are identified. There is some chondrocalcinosis as well of the mediolateral compartments. Subchondral cyst formation along the medial tibial plateau. The lateral view are oblique limiting evaluation. No large effusion but evaluation of a a small effusion is limited. Repeat lateral view could be considered versus additional imaging. IMPRESSION: Degenerative changes. Osteopenia. Chondrocalcinosis. No large effusion but lateral views are limited due to rotation. Subtle effusion is not excluded. Further workup as clinically  appropriate Electronically Signed   By: Adrianna Horde M.D.   On: 01/30/2024 16:58   US  EKG SITE RITE Result Date: 01/29/2024 If Site Rite image not attached, placement could not be confirmed due to current cardiac rhythm.    Assessment/Plan:  Group B streptococcus bacteremia 4 out of 4 high bioburden.  With pacemaker need to rule out endocarditis and wire vegetation.  Ideally would need pacemaker removal but as per EPI/cardiology  he is not a candidate currently 2D echo has not shown valve vegetations so he will need TEE.once he is stable  Will avoid PICC line placement until the bacteremia has cleared and also once we know the status of his heart valves.  Patient is on IV ceftriaxone    CHF followed by cardiology NSTEMI  A-fib  Bilateral wounds on his feet Culture shows rare group B streptococcus, rare Staph aureus, rare Enterococcus No addition of antibiotics currently.  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  Status post aspiration.  Culture pending.   Discussed the management with the patient and care team.  Discussed with his daughter yesterday ID will not routinely see him this weekend On-call physician  available by phone for urgent issues.  Call if needed

## 2024-02-01 DIAGNOSIS — I5023 Acute on chronic systolic (congestive) heart failure: Secondary | ICD-10-CM | POA: Diagnosis not present

## 2024-02-01 DIAGNOSIS — N179 Acute kidney failure, unspecified: Secondary | ICD-10-CM | POA: Diagnosis not present

## 2024-02-01 DIAGNOSIS — A401 Sepsis due to streptococcus, group B: Secondary | ICD-10-CM | POA: Diagnosis not present

## 2024-02-01 DIAGNOSIS — I214 Non-ST elevation (NSTEMI) myocardial infarction: Secondary | ICD-10-CM | POA: Diagnosis not present

## 2024-02-01 LAB — AEROBIC CULTURE W GRAM STAIN (SUPERFICIAL SPECIMEN)
Gram Stain: NONE SEEN
Gram Stain: NONE SEEN

## 2024-02-01 LAB — BASIC METABOLIC PANEL WITH GFR
Anion gap: 12 (ref 5–15)
BUN: 86 mg/dL — ABNORMAL HIGH (ref 8–23)
CO2: 24 mmol/L (ref 22–32)
Calcium: 9 mg/dL (ref 8.9–10.3)
Chloride: 103 mmol/L (ref 98–111)
Creatinine, Ser: 1.53 mg/dL — ABNORMAL HIGH (ref 0.61–1.24)
GFR, Estimated: 46 mL/min — ABNORMAL LOW (ref >60)
Glucose, Bld: 165 mg/dL — ABNORMAL HIGH (ref 70–99)
Potassium: 3.5 mmol/L (ref 3.5–5.1)
Sodium: 139 mmol/L (ref 135–145)

## 2024-02-01 LAB — CBC
HCT: 33.9 % — ABNORMAL LOW (ref 39.0–52.0)
Hemoglobin: 10.6 g/dL — ABNORMAL LOW (ref 13.0–17.0)
MCH: 26.8 pg (ref 26.0–34.0)
MCHC: 31.3 g/dL (ref 30.0–36.0)
MCV: 85.8 fL (ref 80.0–100.0)
Platelets: 173 10*3/uL (ref 150–400)
RBC: 3.95 MIL/uL — ABNORMAL LOW (ref 4.22–5.81)
RDW: 16.1 % — ABNORMAL HIGH (ref 11.5–15.5)
WBC: 9.4 10*3/uL (ref 4.0–10.5)
nRBC: 0 % (ref 0.0–0.2)

## 2024-02-01 LAB — GLUCOSE, CAPILLARY
Glucose-Capillary: 153 mg/dL — ABNORMAL HIGH (ref 70–99)
Glucose-Capillary: 176 mg/dL — ABNORMAL HIGH (ref 70–99)
Glucose-Capillary: 171 mg/dL — ABNORMAL HIGH (ref 70–99)
Glucose-Capillary: 149 mg/dL — ABNORMAL HIGH (ref 70–99)

## 2024-02-01 LAB — HEPARIN LEVEL (UNFRACTIONATED): Heparin Unfractionated: 0.37 [IU]/mL (ref 0.30–0.70)

## 2024-02-01 MED ORDER — ASPIRIN 81 MG PO CHEW
81.0000 mg | CHEWABLE_TABLET | Freq: Every day | ORAL | Status: DC
  Administered 2024-02-01 – 2024-02-06 (×6): 81 mg via ORAL
  Filled 2024-02-01 (×6): qty 1

## 2024-02-01 MED ORDER — VANCOMYCIN HCL 1750 MG/350ML IV SOLN
1750.0000 mg | Freq: Once | INTRAVENOUS | Status: AC
  Administered 2024-02-01: 1750 mg via INTRAVENOUS
  Filled 2024-02-01: qty 350

## 2024-02-01 MED ORDER — VANCOMYCIN HCL 1750 MG/350ML IV SOLN
1750.0000 mg | INTRAVENOUS | Status: DC
  Administered 2024-02-02 – 2024-02-03 (×2): 1750 mg via INTRAVENOUS
  Filled 2024-02-01 (×2): qty 350

## 2024-02-01 MED ORDER — VANCOMYCIN HCL 500 MG/100ML IV SOLN
500.0000 mg | Freq: Once | INTRAVENOUS | Status: AC
  Administered 2024-02-01: 500 mg via INTRAVENOUS
  Filled 2024-02-01: qty 100

## 2024-02-01 NOTE — Plan of Care (Signed)
  Problem: Education: Goal: Ability to describe self-care measures that may prevent or decrease complications (Diabetes Survival Skills Education) will improve Outcome: Progressing Goal: Individualized Educational Video(s) Outcome: Progressing   Problem: Coping: Goal: Ability to adjust to condition or change in health will improve Outcome: Progressing   Problem: Fluid Volume: Goal: Ability to maintain a balanced intake and output will improve Outcome: Progressing   Problem: Health Behavior/Discharge Planning: Goal: Ability to identify and utilize available resources and services will improve Outcome: Progressing Goal: Ability to manage health-related needs will improve Outcome: Progressing   Problem: Metabolic: Goal: Ability to maintain appropriate glucose levels will improve Outcome: Progressing   Problem: Nutritional: Goal: Maintenance of adequate nutrition will improve Outcome: Progressing Goal: Progress toward achieving an optimal weight will improve Outcome: Progressing   Problem: Skin Integrity: Goal: Risk for impaired skin integrity will decrease Outcome: Progressing   Problem: Tissue Perfusion: Goal: Adequacy of tissue perfusion will improve Outcome: Progressing   Problem: Education: Goal: Knowledge of General Education information will improve Description: Including pain rating scale, medication(s)/side effects and non-pharmacologic comfort measures Outcome: Progressing   Problem: Health Behavior/Discharge Planning: Goal: Ability to manage health-related needs will improve Outcome: Progressing   Problem: Clinical Measurements: Goal: Ability to maintain clinical measurements within normal limits will improve Outcome: Progressing Goal: Will remain free from infection Outcome: Progressing Goal: Diagnostic test results will improve Outcome: Progressing Goal: Respiratory complications will improve Outcome: Progressing Goal: Cardiovascular complication will  be avoided Outcome: Progressing   Problem: Activity: Goal: Risk for activity intolerance will decrease Outcome: Progressing   Problem: Nutrition: Goal: Adequate nutrition will be maintained Outcome: Progressing   Problem: Coping: Goal: Level of anxiety will decrease Outcome: Progressing   Problem: Elimination: Goal: Will not experience complications related to bowel motility Outcome: Progressing Goal: Will not experience complications related to urinary retention Outcome: Progressing   Problem: Pain Managment: Goal: General experience of comfort will improve and/or be controlled Outcome: Progressing   Problem: Safety: Goal: Ability to remain free from injury will improve Outcome: Progressing   Problem: Skin Integrity: Goal: Risk for impaired skin integrity will decrease Outcome: Progressing   Problem: Fluid Volume: Goal: Hemodynamic stability will improve Outcome: Progressing   Problem: Clinical Measurements: Goal: Diagnostic test results will improve Outcome: Progressing Goal: Signs and symptoms of infection will decrease Outcome: Progressing   Problem: Respiratory: Goal: Ability to maintain adequate ventilation will improve Outcome: Progressing

## 2024-02-01 NOTE — Progress Notes (Addendum)
 [facility] Cardiology  CARDIOLOGY PROGRESS NOTE  Patient ID: Tyler [name] MRN: [id number] DOB/AGE: 1946/04/04 78 y.o.  Admit date: 01/26/2024 Referring Physician Dr. Farrel Hones Reason for Consultation acute on chronic heart failure with reduced EF  HPI: Altered mentation and pulled his IV overnight, currently requiring mittens. Mentation appears improving this am. He is auto diuresing and had 3 and half liters output yesterday, weight down by 4 pounds in last 24 hours.  Review of systems complete and found to be negative unless listed above     Past Medical History:  Diagnosis Date   (HFpEF) heart failure with preserved ejection fraction (HCC) 03/01/2020   a.) TTE 03/01/2020: EF 55-60%, mod MAC, mod AoV sclerosis, triv AR, mild TR, mod MR, RVSP 50-59; b.) TTE 09/10/2020: EF 55-60%, mod MAC, mod AoV sclerosis, mild TR, 3+ MR, RVSP 50-59; c.) TTE 09/26/2020: EF 55-60%, mild LA dil, triv PR, mild MR/TR, RVSP 37-49   Adenoma of left adrenal gland    Anemia    Arthritis    Atrial fibrillation and flutter (HCC)    a.) CHA2DS2VASc = 5 (age x2, HFpEF, HTN, T2DM);  b.) s/p CTI ablation 09/07/2020; c.) rate/rhythm maintained on oral carvedilol ; not on chronic anticoagulation therapy   CAD (coronary artery disease)    Cardiomegaly    CKD (chronic kidney disease), stage III (HCC)    DOE (dyspnea on exertion)    Drug-induced bradycardia    Gangrene of toe of left foot (HCC)    a.) 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   H/O active rheumatic fever 08/22/2020   Hepatosplenomegaly    History of bilateral cataract extraction    HLD (hyperlipidemia)    Hypertension    Long term current use of aspirin     Lymphedema of both lower extremities    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)    a.) s/p amputation 11/04/2022   Peripheral vascular disease (HCC)    Pleural effusion on right 09/09/2020   a.) s/p RIGHT thoracentesis with 2180 cc yield   Pneumonia    Presence of permanent cardiac pacemaker  09/10/2020   a.) TVP placement 09/10/2020 due to intermittent CHB in setting of urosepsis; b.) s/p PPM placement 09/15/2020: MDT Azure XT DR (SN: [id number])   Pulmonary hypertension (HCC) 03/01/2020   a.) TTE: 03/01/2020: RVSP 50-59; b.) TTE 09/10/2020: RVSP 50-59; c.) TTE 09/26/2020: RVSP 37-49   RA (rheumatoid arthritis) (HCC)    Rheumatic fever    Sepsis (HCC) 09/10/2020   a.) urosepsis --> BC x 2 sets and UC all grew out significant Proteus mirabilis; admitted to [facility] 09/07/2020 - 09/27/2020.   Sick sinus syndrome [facility])    a.) s/p MDT PPM placement 09/15/2020   T2DM (type 2 diabetes mellitus) (HCC)    Urinary retention    chronic, with indwelling Foley catheter and plans for a suprapubic   Wears dentures    full upper    Past Surgical History:  Procedure Laterality Date   AMPUTATION TOE Right 11/04/2022   Procedure: AMPUTATION TOE;  Surgeon: Dot Gazella, [name];  [name];  Service: Podiatry;  Laterality: Right;   AMPUTATION TOE Right 09/15/2023   Procedure: AMPUTATION TOE;  Surgeon: Jennefer Moats, [name];  [name];  Service: Orthopedics/Podiatry;  Laterality: Right;  Right hallux amputation   BONE BIOPSY Right 04/05/2023   Procedure: BONE BIOPSY THIRD & FOURTH;  Surgeon: Dot Gazella, [name];  [name];  Service: Podiatry;  Laterality: Right;  CARDIAC ELECTROPHYSIOLOGY STUDY AND ABLATION N/A 09/07/2020   Procedure: CARDIAC EP STUDY AND ABLATION (CTI)   CATARACT EXTRACTION W/PHACO Right 01/16/2017   Procedure: CATARACT EXTRACTION PHACO AND INTRAOCULAR LENS PLACEMENT (IOC)  Right Complicated;  Surgeon: Annell Kidney, MD;  Location: [facility];  Service: Ophthalmology;  Laterality: Right;  IVA Block Healon 5 malyugin vision blue Diabetic - oral meds   CATARACT EXTRACTION W/PHACO Left 10/23/2022   Procedure: CATARACT EXTRACTION PHACO AND INTRAOCULAR LENS PLACEMENT (IOC) LEFT DIABETIC;  Surgeon: Clair Crews, MD;   Location: [facility];  Service: Ophthalmology;  Laterality: Left;  Diabetic   COLONOSCOPY     INCISION AND DRAINAGE OF WOUND Left 09/15/2023   Procedure: IRRIGATION AND DEBRIDEMENT WOUND;  Surgeon: Jennefer Moats, [name];  [name];  Service: Orthopedics/Podiatry;  Laterality: Left;  Left foot wound debridement, possible graft/biopsy   LOWER EXTREMITY ANGIOGRAPHY Right 11/02/2022   Procedure: Lower Extremity Angiography;  Surgeon: Celso College, MD;  Location: [facility] INVAS[facility];  Service: Cardiovascular;  Laterality: Right;   LOWER EXTREMITY ANGIOGRAPHY Right 09/13/2023   Procedure: Lower Extremity Angiography;  Surgeon: Jackquelyn Mass, MD;  Location: [facility] INVAS[facility] CV LAB;  Service: Cardiovascular;  Laterality: Right;   METATARSAL HEAD EXCISION Left 07/05/2018   Procedure: RESECTION FIRST METATARSAL INFECTED BONE AND SOFT TISSUE;  Surgeon: Sharlyn Deaner, [name];  [name];  Service: Podiatry;  Laterality: Left;   METATARSAL HEAD EXCISION Right 04/05/2023   Procedure: METATARSAL HEAD EXCISION THIRD & FOURTH;  Surgeon: Dot Gazella, [name];  [name];  Service: Podiatry;  Laterality: Right;   PACEMAKER INSERTION  09/15/2020   TOE AMPUTATION Left    TONSILLECTOMY      Medications Prior to Admission  Medication Sig Dispense Refill Last Dose/Taking   acetaminophen  (TYLENOL ) 500 MG tablet Take 2 tablets (1,000 mg total) by mouth 3 (three) times daily as needed (for pain).   Taking As Needed   aspirin  EC 81 MG tablet Take 1 tablet (81 mg total) by mouth daily. Swallow whole. 30 tablet 0 01/25/2024   atorvastatin  (LIPITOR ) 10 MG tablet Take 1 tablet (10 mg total) by mouth daily at 6 PM. 30 tablet 11 Taking   bumetanide  (BUMEX ) 2 MG tablet Take 1 tablet (2 mg total) by mouth daily. 90 tablet 3 Past Week   clopidogrel  (PLAVIX ) 75 MG tablet Take 1 tablet (75 mg total) by mouth daily with breakfast. 30 tablet 11 01/25/2024 Morning   Finerenone  (KERENDIA ) 10 MG TABS  Take 1 tablet (10 mg total) by mouth daily. 30 tablet 5 Past Week   furosemide  (LASIX ) 40 MG tablet Take 40 mg by mouth 2 (two) times daily.   01/25/2024   gentamicin  cream (GARAMYCIN ) 0.1 % Apply 1 Application topically 2 (two) times daily. 30 g 1 Taking   glipiZIDE  (GLUCOTROL ) 10 MG tablet Take 1 tablet (10 mg total) by mouth 2 (two) times daily. 60 tablet 2 Past Month   losartan  (COZAAR ) 25 MG tablet Take 25 mg by mouth daily.   Taking   metolazone  (ZAROXOLYN ) 2.5 MG tablet Take 1 tablet (2.5 mg total) by mouth 2 (two) times a week. On Mondays & Fridays   Past Month   Multiple Vitamins-Minerals (MULTIVITAMIN WITH MINERALS) tablet Take 1 tablet by mouth daily.   01/25/2024   silver  sulfADIAZINE  (SILVADENE ) 1 % cream Apply to affected area daily 50 g 1 Taking   doxycycline  (VIBRA -TABS) 100 MG tablet Take 1 tablet (100 mg total) by mouth 2 (  two) times daily. (Patient not taking: Reported on 01/26/2024) 20 tablet 0 Not Taking   metFORMIN  (GLUCOPHAGE ) 1000 MG tablet Take 1 tablet (1,000 mg total) by mouth 2 (two) times daily. (Patient not taking: Reported on 01/26/2024) 60 tablet 2 Not Taking   Social History   Socioeconomic History   Marital status: Single    Spouse name: Not on file   Number of children: Not on file   Years of education: Not on file   Highest education level: Not on file  Occupational History    Comment: [employer]  Tobacco Use   Smoking status: Never   Smokeless tobacco: Never  Vaping Use   Vaping status: Never Used  Substance and Sexual Activity   Alcohol use: Not Currently    Comment: rarely   Drug use: Never   Sexual activity: Not Currently    Birth control/protection: None  Other Topics Concern   Not on file  Social History Narrative   Lives with roommate   Social Drivers of Health   Financial Resource Strain: Low Risk  (03/20/2023)   Received from [facility], [facility]   Overall Financial Resource Strain (CARDIA)    Difficulty of Paying Living  Expenses: Not hard at all  Food Insecurity: No Food Insecurity (01/27/2024)   Hunger Vital Sign    Worried About Running Out of Food in the Last Year: Never true    Ran Out of Food in the Last Year: Never true  Transportation Needs: No Transportation Needs (01/27/2024)   PRAPARE - Administrator, Civil Service (Medical): No    Lack of Transportation (Non-Medical): No  Physical Activity: Not on file  Stress: No Stress Concern Present (09/07/2020)   Received from [facility], [facility] - Occupational Stress Questionnaire    Feeling of Stress : Not at all  Social Connections: Socially Isolated (01/27/2024)   Social Connection and Isolation Panel [NHANES]    Frequency of Communication with Friends and Family: More than three times a week    Frequency of Social Gatherings with Friends and Family: Twice a week    Attends Religious Services: Never    Database administrator or Organizations: No    Attends Banker Meetings: Never    Marital Status: Divorced  Catering manager Violence: Not At Risk (01/27/2024)   Humiliation, Afraid, Rape, and Kick questionnaire    Fear of Current or Ex-Partner: No    Emotionally Abused: No    Physically Abused: No    Sexually Abused: No    Family History  Problem Relation Age of Onset   Cancer Niece       Review of systems complete and found to be negative unless listed above      PHYSICAL EXAM  Altered but mentation appears improved compared to yesterday +1 pedal edema No wheeze  Labs:   Lab Results  Component Value Date   WBC 9.4 02/01/2024   HGB 10.6 (L) 02/01/2024   HCT 33.9 (L) 02/01/2024   MCV 85.8 02/01/2024   PLT 173 02/01/2024    Recent Labs  Lab 01/27/24 2043 01/28/24 0401 02/01/24 0455  NA 136   < > 139  K 4.0   < > 3.5  CL 108   < > 103  CO2 19*   < > 24  BUN 61*   < > 86*  CREATININE 1.82*   < > 1.53*  CALCIUM  8.8*   < >  9.0  PROT 6.9  --   --    BILITOT 1.2  --   --   ALKPHOS 58  --   --   ALT 22  --   --   AST 28  --   --   GLUCOSE 145*   < > 165*   < > = values in this interval not displayed.   Lab Results  Component Value Date   TROPONINI 0.05 (HH) 07/03/2018    Lab Results  Component Value Date   CHOL 148 07/03/2023   CHOL 102 07/07/2014   Lab Results  Component Value Date   HDL 31 (L) 07/03/2023   HDL 25 (L) 07/07/2014   Lab Results  Component Value Date   LDLCALC 95 07/03/2023   LDLCALC 55 07/07/2014   Lab Results  Component Value Date   TRIG 111 07/03/2023   TRIG 112 07/07/2014   Lab Results  Component Value Date   CHOLHDL 4.8 07/03/2023   No results found for: "LDLDIRECT"    Radiology: DG Knee 1-2 Views Left Result Date: 01/30/2024 CLINICAL DATA:  Knee effusion. EXAM: LEFT KNEE - 2 VIEW COMPARISON:  None Available. FINDINGS: Osteopenia. Joint space loss mildly of the mediolateral compartment and patellofemoral joint with scattered osteophyte formation. Scattered vascular calcifications are identified. There is some chondrocalcinosis as well of the mediolateral compartments. Subchondral cyst formation along the medial tibial plateau. The lateral view are oblique limiting evaluation. No large effusion but evaluation of a a small effusion is limited. Repeat lateral view could be considered versus additional imaging. IMPRESSION: Degenerative changes. Osteopenia. Chondrocalcinosis. No large effusion but lateral views are limited due to rotation. Subtle effusion is not excluded. Further workup as clinically appropriate Electronically Signed   By: Adrianna Horde M.D.   On: 01/30/2024 16:58   US  EKG SITE RITE Result Date: 01/29/2024 If Site Rite image not attached, placement could not be confirmed due to current cardiac rhythm.  ECHOCARDIOGRAM COMPLETE Result Date: 01/29/2024    ECHOCARDIOGRAM REPORT   Patient Name:   Tyler [name] Date of Exam: 01/28/2024 Medical Rec #:  [id number]      Height:       78.0 in Accession  #:    [id number]     Weight:       260.8 lb Date of Birth:  October 17, 1945      BSA:          2.527 m Patient Age:    77 years       BP:           103/75 mmHg Patient Gender: M              HR:           96 bpm. Exam Location:  [facility] Procedure: 2D Echo, Cardiac Doppler and Color Doppler (Both Spectral and Color            Flow Doppler were utilized during procedure). Indications:     NSTEMI I21.4  History:         Patient has no prior history of Echocardiogram examinations,                  most recent 07/04/2023. CHF and Cardiomyopathy, Previous                  Myocardial Infarction, Pacemaker, PAD and CKD, stage 3,                  Arrythmias:Bradycardia,  Atrial Fibrillation and Atrial Flutter,                  Signs/Symptoms:Dyspnea and Syncope; Risk Factors:Diabetes,                  Hypertension and Dyslipidemia.  Sonographer:     Terrilee Few RCS Referring Phys:  [id number] CARALYN HUDSON Diagnosing Phys: Lida Reeks Nanci Lakatos IMPRESSIONS  1. Left ventricular ejection fraction, by estimation, is 25 to 30%. The left ventricle has severely decreased function. The left ventricle demonstrates global hypokinesis. There is mild left ventricular hypertrophy. Left ventricular diastolic parameters  are indeterminate.  2. Right ventricular systolic function is moderately reduced. The right ventricular size is mildly enlarged. There is severely elevated pulmonary artery systolic pressure. The estimated right ventricular systolic pressure is 73.9 mmHg.  3. Left atrial size was mildly dilated.  4. Right atrial size was moderately dilated.  5. Eccentric mitral regurgitation which can be underestimated on this study. Recommend TEE for further evaluation. Moderate to severe mitral valve regurgitation. Mild to moderate mitral stenosis. Severe mitral annular calcification.  6. Sclerotic aortic valve with low flow low gradient aortic stenosis, probably mild to moderate.. The aortic valve is tricuspid.  7. Aortic dilatation noted. There is  mild dilatation of the aortic root, measuring 44 mm. FINDINGS  Left Ventricle: Left ventricular ejection fraction, by estimation, is 25 to 30%. The left ventricle has severely decreased function. The left ventricle demonstrates global hypokinesis. There is mild left ventricular hypertrophy. Left ventricular diastolic parameters are indeterminate. Right Ventricle: The right ventricular size is mildly enlarged. No increase in right ventricular wall thickness. Right ventricular systolic function is moderately reduced. There is severely elevated pulmonary artery systolic pressure. The tricuspid regurgitant velocity is 4.06 m/s, and with an assumed right atrial pressure of 8 mmHg, the estimated right ventricular systolic pressure is 73.9 mmHg. Left Atrium: Left atrial size was mildly dilated. Right Atrium: Right atrial size was moderately dilated. Pericardium: There is no evidence of pericardial effusion. Mitral Valve: Eccentric mitral regurgitation which can be underestimated on this study. Recommend TEE for further evaluation. There is mild thickening of the mitral valve leaflet(s). There is mild calcification of the mitral valve leaflet(s). Severe mitral annular calcification. Moderate to severe mitral valve regurgitation, with anteriorly-directed jet. Mild to moderate mitral valve stenosis. MV peak gradient, 14.7 mmHg. The mean mitral valve gradient is 8.4 mmHg. Tricuspid Valve: The tricuspid valve is normal in structure. Tricuspid valve regurgitation is mild. Aortic Valve: Sclerotic aortic valve with low flow low gradient aortic stenosis, probably mild to moderate. The aortic valve is tricuspid. Aortic valve mean gradient measures 10.0 mmHg. Aortic valve peak gradient measures 16.4 mmHg. Aortic valve area, by  VTI measures 1.43 cm. Pulmonic Valve: The pulmonic valve was not well visualized. Pulmonic valve regurgitation is trivial. Aorta: Aortic dilatation noted. There is mild dilatation of the aortic root,  measuring 44 mm. IAS/Shunts: The interatrial septum was not well visualized.  LEFT VENTRICLE PLAX 2D LVIDd:         5.50 cm   Diastology LVIDs:         4.50 cm   LV e' medial:    5.87 cm/s LV PW:         1.60 cm   LV E/e' medial:  22.5 LV IVS:        1.50 cm   LV e' lateral:   10.80 cm/s LVOT diam:     2.30 cm   LV E/e' lateral: 12.2  LV SV:         53 LV SV Index:   21 LVOT Area:     4.15 cm  RIGHT VENTRICLE            IVC RV S prime:     8.59 cm/s  IVC diam: 3.00 cm TAPSE (M-mode): 1.6 cm LEFT ATRIUM              Index        RIGHT ATRIUM           Index LA diam:        5.90 cm  2.33 cm/m   RA Area:     28.30 cm LA Vol (A2C):   114.0 ml 45.11 ml/m  RA Volume:   112.00 ml 44.32 ml/m LA Vol (A4C):   79.7 ml  31.54 ml/m LA Biplane Vol: 95.1 ml  37.63 ml/m  AORTIC VALVE AV Area (Vmax):    1.77 cm AV Area (Vmean):   1.56 cm AV Area (VTI):     1.43 cm AV Vmax:           202.50 cm/s AV Vmean:          144.500 cm/s AV VTI:            0.375 m AV Peak Grad:      16.4 mmHg AV Mean Grad:      10.0 mmHg LVOT Vmax:         86.13 cm/s LVOT Vmean:        54.333 cm/s LVOT VTI:          0.129 m LVOT/AV VTI ratio: 0.34  AORTA Ao Asc diam: 3.80 cm MITRAL VALVE                TRICUSPID VALVE MV Area (PHT): 2.66 cm     TR Peak grad:   65.9 mmHg MV Area VTI:   1.36 cm     TR Vmax:        406.00 cm/s MV Peak grad:  14.7 mmHg MV Mean grad:  8.4 mmHg     SHUNTS MV Vmax:       1.91 m/s     Systemic VTI:  0.13 m MV Vmean:      134.8 cm/s   Systemic Diam: 2.30 cm MV Decel Time: 285 msec MV E velocity: 132.00 cm/s MV A velocity: 98.50 cm/s MV E/A ratio:  1.34 Joetta Mustache Electronically signed by Joetta Mustache Signature Date/Time: 01/29/2024/1:23:56 PM    Final    CT Angio Chest Pulmonary Embolism (PE) W or WO Contrast Result Date: 01/27/2024 CLINICAL DATA:  Chest pain.  PE suspected EXAM: CT ANGIOGRAPHY CHEST WITH CONTRAST TECHNIQUE: Multidetector CT imaging of the chest was performed using the standard protocol during bolus  administration of intravenous contrast. Multiplanar CT image reconstructions and MIPs were obtained to evaluate the vascular anatomy. RADIATION DOSE REDUCTION: This exam was performed according to the departmental dose-optimization program which includes automated exposure control, adjustment of the mA and/or kV according to patient size and/or use of iterative reconstruction technique. CONTRAST:  75mL OMNIPAQUE  IOHEXOL  350 MG/ML SOLN COMPARISON:  Same day chest radiograph FINDINGS: Cardiovascular: Cardiomegaly. Mitral annular calcification. Coronary artery and aortic atherosclerotic calcification. Mild dilation of the ascending aorta measuring 40 mm in diameter. No pericardial effusion. Negative for acute pulmonary embolism. Left chest wall pacemaker. Mediastinum/Nodes: Trachea and esophagus are unremarkable. Shotty mediastinal lymph nodes are likely reactive. Lungs/Pleura: Small right pleural effusion. Interlobular septal thickening  and hazy ground-glass opacities with a lower lung and posterior predominance. No focal consolidation, pleural effusion, or pneumothorax. Upper Abdomen: No acute abnormality. Musculoskeletal: No acute fracture. Review of the MIP images confirms the above findings. IMPRESSION: 1. Negative for acute pulmonary embolism. 2. Findings suggestive of CHF with mild pulmonary edema. Small right pleural effusion. 3. 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 Recommend annual imaging followup by CTA or MRA. This recommendation follows 2010 ACCF/AHA/AATS/ACR/ASA/SCA/SCAI/SIR/STS/SVM Guidelines for the Diagnosis and Management of Patients with Thoracic Aortic Disease. Circulation. 2010; 121: N829-F621. Aortic aneurysm NOS (ICD10-I71.9) 4. Aortic Atherosclerosis (ICD10-I70.0). Electronically Signed   By: Rozell Cornet M.D.   On: 01/27/2024 22:11   CT Head Wo Contrast Result Date: 01/26/2024 CLINICAL DATA:  Mental status change. EXAM: CT HEAD WITHOUT CONTRAST TECHNIQUE: Contiguous axial images were  obtained from the base of the skull through the vertex without intravenous contrast. RADIATION DOSE REDUCTION: This exam was performed according to the departmental dose-optimization program which includes automated exposure control, adjustment of the mA and/or kV according to patient size and/or use of iterative reconstruction technique. COMPARISON:  Head CT 07/04/2018. FINDINGS: Brain: No evidence of acute infarction, hemorrhage, hydrocephalus, extra-axial collection or mass lesion/mass effect. There is mild diffuse atrophy and mild periventricular white matter hypodensity which is similar to the prior study. Vascular: Atherosclerotic calcifications are present within the cavernous internal carotid arteries. Skull: Normal. Negative for fracture or focal lesion. Sinuses/Orbits: No acute finding. Other: None. IMPRESSION: 1. No acute intracranial process. 2. Mild diffuse atrophy and mild chronic small vessel ischemic changes. Electronically Signed   By: Tyron Gallon M.D.   On: 01/26/2024 20:18   DG Chest Portable 1 View Result Date: 01/26/2024 CLINICAL DATA:  Chest pain EXAM: PORTABLE CHEST 1 VIEW COMPARISON:  09/11/2023 FINDINGS: Left-sided pacing device similar in position. Cardiomegaly with mild central congestion. No consolidation, pleural effusion or pneumothorax. IMPRESSION: Cardiomegaly with mild central congestion. Electronically Signed   By: Esmeralda Hedge M.D.   On: 01/26/2024 20:14    EKG: Paced rhythm on monitor, PVCs improved  ASSESSMENT AND PLAN:  Acute on chronic heart failure with reduced EF NSTEMI type II versus type I AV block status post pacemaker Chronic atrial fibrillation NSVT Strep agalactiae bacteremia AKI on CKD, improving  He continues to auto diurese with good urine output and improving renal function.  Will hold off on any diuretics today. Continue heparin  drip, aspirin  Plan for TEE next week based on his clinical status. Eventually he would need ischemic evaluation, not  candidate at this time. Appreciate heart failure and EP recommendations. Will benefit from pacer upgrade to CRT in long run, not candidate at this time   Joetta Mustache, MD [facility]- [facility]  Signed: Janette Medley MD 02/01/2024, 8:04 AM

## 2024-02-01 NOTE — Progress Notes (Signed)
 Speech Language Pathology Treatment: Dysphagia  Patient Details Name: Tyler [name] MRN: [id number] DOB: May 09, 1946 Today's Date: 02/01/2024 Time: 1220-1250 SLP Time Calculation (min) (ACUTE ONLY): 30 min  Assessment / Plan / Recommendation Clinical Impression  Pt seen for follow up dysphagia intervention targeting trials of advanced PO and compensatory strategy training. Pt remains on 1L O2, afebrile, with no recent chest imaging. RN reporting intermittent coughing with/without PO. Pt lethargic, requiring verbal and tactile cues to ensure sustained alertness for completion of PO trials. RN reporting increased alertness today compared to yesterday. Dry cough noted in the absence and intermittently during PO trials- unrelated to completion of the swallow. Pt seen with trials of thin liquids and nectar thick liquids via cup and mech soft and puree solids from lunch tray. Pt requiring total assist for completion of trials secondary to bilateral mittens in place. Pt with immediate/delayed cough with thin liquids- no s/sx of aspiration with solids or nectar thick liquids. Oral phase prolonged with SLP demonstrating use of liquid wash to aid clearance coupled with verbal cues for redirection/completion of trials.   Given continued s/sx of aspiration and lethargy/dentition/mentation, recommend continued nectar thick liquids and mech soft solids. Total assist/supervision for feeding. Ensure maintained alertness for meals. Rest breaks recommended if endurance for meals remains reduced. Pt's current deconditioning and limited endurance, increase risk for aspiration. Aspiration precautions remain, including slow rate, small bites, elevated HOB, and alert for PO intake. SLP will continue to follow.    HPI HPI: Pt is a 78 y.o. Caucasian male with medical history significant for NSTEMI, HFpEF, wound care for toe amputation, lymphedema, osteoarthritis, osteomyelitis w/ toe amputation, r pleural effusion w/ drain,  atrial fibrillation and flutter, coronary artery disease, RA, stage III chronic kidney disease, PVD, dyslipidemia and hypertension, as well as type 2 diabetes mellitus, who presented to the emergency room with a cancer of dizziness, lightheadedness and confusion that started around 4 PM today when he came back home from work.  This was associated with chest pain and dyspnea.  He describes his chest pain as dull aching graded 5/10 in severity with no radiation, nausea or vomiting or diaphoresis or palpitations.  His symptoms later resolved spontaneously.   Chest CT imaging: Lungs/Pleura: Small right pleural effusion. Interlobular septal  thickening and hazy ground-glass opacities with a lower lung and  posterior predominance. No focal consolidation, pleural effusion, or  pneumothorax.  Findings suggestive of CHF.      SLP Plan  Continue with current plan of care      Recommendations for follow up therapy are one component of a multi-disciplinary discharge planning process, led by the attending physician.  Recommendations may be updated based on patient status, additional functional criteria and insurance authorization.    Recommendations  Diet recommendations: Dysphagia 3 (mechanical soft);Nectar-thick liquid Liquids provided via: Cup;No straw Medication Administration: Whole meds with puree Supervision: Staff to assist with self feeding;Full supervision/cueing for compensatory strategies Compensations: Minimize environmental distractions;Slow rate;Small sips/bites;Lingual sweep for clearance of pocketing;Follow solids with liquid Postural Changes and/or Swallow Maneuvers: Out of bed for meals;Seated upright 90 degrees;Upright 30-60 min after meal                  Staff/trained caregiver to provide oral care;Oral care BID (denture care)   Frequent or constant Supervision/Assistance Dysphagia, pharyngoesophageal phase (R13.14) (previously noted belching)     Continue with current plan of  care    Tyler Kallan Bischoff Clapp, MS, CCC-SLP Speech Language Pathologist Rehab Services; [facility]  [facility] [phone] (ascom)   Tyler [name]  02/01/2024, 3:49 PM

## 2024-02-01 NOTE — Progress Notes (Signed)
 PROGRESS NOTE    Tyler [name]  AOZ:[id number] DOB: 1945-12-14 DOA: 01/26/2024 PCP: McClanahan, Kyra, NP  Subjective: Pt seen and examined. VBG yesterday did not show any evidence of CO2 retention. Pt in mittens this AM due to pulling at his IV. Pt diuresing without diuretics. Cards does not want to give any diuretics at this time.   Hospital Course: Taken from H&P.  Tyler [name] is a 78 y.o. 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  He was also experiencing 5/10, nonradiating dull chest pain with no other associated symptoms.  On presentation patient has mild tachycardia.  Labs with BUN of 41, creatinine 1.5, baseline seems to be around 1.3, CO2 of 17, troponin 69>.105 EKG shows A-fib with RVR, PVCs and LBBB. CT head was negative for any acute intracranial abnormality CXR with cardiomegaly and mild central congestion.  Patient was initially started on heparin  infusion and cardiology was consulted.  5/5: Vital stable.  Worsening leukocytosis at 21.9, platelet at 143, none anion gap metabolic acid with worsening of bicarb to 16, BNP significantly elevated at 4043. Troponin continued to increase, currently at 714. Cardiology started him on IV Bumex .  Echocardiogram ordered  5/6: Rapid response was called overnight when patient became very tachycardic and tachypneic, found to be febrile at 102.9.  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 Blood cultures started growing group B streptococcus this morning-antibiotics are being switched to ceftriaxone .  ID was consulted.  Patient also has a pacemaker in place. Rising troponin, maximum recorded at 1830, improving leukocytosis, potassium 3.3 and worsening creatinine at 2.05 so further IV Bumex  was held this morning.  HPI: Tyler [name] is a 78 y.o. Caucasian male with medical history significant for HFpEF, osteoarthritis, atrial fibrillation and flutter, coronary artery disease, stage III chronic kidney disease, dyslipidemia and hypertension, as well as type 2 diabetes mellitus, who presented to the emergency room with a cancer of dizziness, lightheadedness and confusion that started around 4 PM today when he came back home from work.  This was associated with chest pain and dyspnea.  He describes his chest pain as dull aching graded 5/10 in severity with no radiation, nausea or vomiting or diaphoresis or palpitations.  His symptoms later resolved spontaneously.    He feels tactile fever without chills.  No cough or wheezing or vomitus.  No leg pain or edema recent travel or surgery.  No dysuria, oliguria or hematuria or flank pain.  No bleeding diathesis.   The patient was started on IV heparin  per ACS protocol. He will be admitted to a cardiac telemetry observation bed for further evaluation and management.   Significant Events: Admitted 01/26/2024 for chest pain 01-27-2024 blood cx positive for Group B Strep agalactiae  Significant Labs: Na 137, k 4.2, CO2 of 17, BUN 41, Scr 1.5, glu 189 Ca 9.3, TP 71. Alb 3.8 WBC 18.2, HgB 11.5, plt 164 Tropoin I 69-->105-->326 A1c 6.9% BNP 4043 01-27-2024 Blood cx Group B strep agalactiae  Significant  Imaging Studies: CT head No acute intracranial process. 2. Mild diffuse atrophy and mild chronic small vessel ischemic changes CXR Cardiomegaly with mild central congestion  CTPA  Negative for acute pulmonary embolism. 2. Findings suggestive of CHF with mild pulmonary edema. Small right pleural effusion. 3. 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 Recommend annual imaging followup by CTA or MRA. 01-28-2024 echo LVEF 25%  Antibiotic Therapy: Anti-infectives (From admission, onward)    Start     Dose/Rate Route Frequency Ordered Stop   01/28/24 1600  vancomycin  (VANCOREADY) IVPB 2000 mg/400 mL  Status:  Discontinued        2,000 mg 200 mL/hr over 120 Minutes Intravenous Every 24 hours 01/27/24 2009 01/28/24 0844   01/28/24 1600  cefTRIAXone  (ROCEPHIN ) 2 g in sodium chloride  0.9 % 100 mL IVPB        2 g 200 mL/hr over 30 Minutes Intravenous Daily 01/28/24 0844     01/28/24 0800  ceFEPIme  (MAXIPIME ) 2 g in sodium chloride  0.9 % 100 mL IVPB  Status:  Discontinued        2 g 200 mL/hr over 30 Minutes Intravenous Every 12 hours 01/27/24 1950 01/28/24 0844   01/27/24 2030  ceFEPIme  (MAXIPIME ) 2 g in sodium chloride  0.9 % 100 mL IVPB        2 g 200 mL/hr over 30 Minutes Intravenous  Once 01/27/24 1942 01/27/24 2242   01/27/24 2030  metroNIDAZOLE  (FLAGYL ) IVPB 500 mg  Status:  Discontinued        500 mg 100 mL/hr over 60 Minutes Intravenous Every 12 hours 01/27/24 1942 01/28/24 0844   01/27/24 2030  vancomycin  (VANCOCIN ) IVPB 1000 mg/200 mL premix  Status:  Discontinued        1,000 mg 200 mL/hr over 60 Minutes Intravenous  Once 01/27/24 1942 01/27/24 1948   01/27/24 1945  vancomycin  (VANCOREADY) IVPB 2000 mg/400 mL        2,000 mg 200 mL/hr over 120 Minutes Intravenous  Once 01/27/24 1948 01/27/24 2304   01/26/24 2300  doxycycline  (VIBRA -TABS) tablet 100 mg  Status:  Discontinued        100 mg Oral 2 times daily 01/26/24 2254 01/26/24 2346       Procedures:   Consultants: Cardiology CHF EP/Cards ID    Assessment and Plan: * NSTEMI (non-ST elevated myocardial infarction) (HCC) On admission through 01-28-2024 Concern of NSTEMI, type I versus type  II Increasing troponin, currently at 714.  Continue to have some chest discomfort. Cardiology is on board -Continue with heparin  infusion - Likely will get ischemic workup after diuresing - Continue with aspirin , Plavix  and atorvastatin   01-29-2024 remains on IV heparin  gtts. No word yet from cards on ischemic eval.  02-01-2024 remains on IV heparin . Ischemic workup at discretion of cards service.  01-30-2024 remains on IV heparin  gtts  02-01-2024 remains on IV heparin  gtts at discretion of cardiology service.  Septicemia due to group B Streptococcus (HCC) 01-29-2024 ID consulted. They want TEE to be performed due to high index of suspicion for endocarditis +/- PPM infection.  Cardiology deferring TEE until next week due to respiratory status.  01-30-2024 concern for endocarditis of valve or PPM leads.  01-31-2024 TEE canceled today by cards due to altered mental status. They will re-evaluate next week. Cards has stated pt is not a candidate for device extraction. Continue with IV ABX under the direction of ID service. Currently on IV rocephin  2 gram daily.  02-01-2024 pt awaiting TEE. Maybe next week.  Remains on IV rocephin  2 grams daily.  Acute kidney injury superimposed on stage 3a chronic kidney disease (HCC) - baseline scr 1.3 On admission through 01-28-2024. Worsening creatinine, AKI on chronic CKD stage IIIa. Worsening non-anion gap metabolic acidosis, multifactorial with AKI, CHF and also concern of sepsis. IV Bumex  was held by cardiology today -Giving gentle IV fluid with bicarb infusion -Monitor renal function -Avoid nephrotoxins  01-29-2024 worsening Scr today. Scr up to 2.33 today. Given his low EF I will avoid IVF for now. Cards gave him 160 mg of IV lasix  today.  Defer to cardiology to manage his AKI/CKD if they are continuing his diuresis.  01-30-2024 Scr 2.44 today.  Received 160 mg IV lasix . 2.3 liters in urine out yesterday. Pt states breathing is better. Defer to  CHF team for further diuresis.  01-31-2024 scr 2.13, BUN 98 today.  has 4.7 liters in urine yesterday. Diuresis management by cardiology team.  Appears further IV diuretics on hold by cardiology team.  02-01-2024 3.5 liters in urine output. Did not receive any diuretics yesterday. Monitor renal function.  Acute on chronic systolic CHF (congestive heart failure) (HCC) On admission through 01-28-2024 Patient with volume overload, positive JVD and elevated BNP at 4043 Echo cardiogram ordered-pending -Cardiology started him on IV Bumex  2 mg twice daily which was held today due to worsening creatinine -Advanced heart failure team consulted -Continue with metolazone  -Daily weight and BMP -Strict intake and output  01-29-2024 will let cards and CHF team manage pt's volume status with diuretics, etc.  01-30-2024 management per CHF team. Yesterday echo LVEF 25%  01-31-2024 has 4.7 liters in urine yesterday. Diuresis management by cardiology team. Appears further IV diuretics on hold by cardiology team.  02-01-2024 3.5 liters in urine output. Did not receive any diuretics yesterday. Monitor renal function.   Sepsis due to cellulitis [facility]) On admission through 01-28-2024 Patient met sepsis criteria after developing high-grade fever with tachycardia overnight.  Likely due to lower extremity cellulitis and bilateral multiple foot wounds. Received broad-spectrum antibiotics overnight Preliminary blood cultures with group B strep agalactia - De-escalate antibiotics to ceftriaxone  - ID was consulted - Echocardiogram pending  01-29-2024 ID consulting. Concern for potential endocarditis +/- PPM infection.   02-01-2024 pt awaiting TEE. Maybe next week. Remains on IV rocephin  2 grams daily. 01-31-2024 TEE canceled today by cards due to altered mental status. They will re-evaluate next week. Cards has stated pt is not a candidate for device extraction. Continue with IV ABX under the direction of ID  service. Currently on IV rocephin  2 gram daily.  01-30-2024 potential for TEE tomorrow. Decision will be made tomorrow AM. NPO after MN in case TEE can be performed tomorrow. Remains on IV ABX.   Pseudogout of left knee 01-31-2024 had left knee arthrocentesis by ortho yesterday. Fluid analysis shows pseudogout.  Color, Synovial YELLOW  Appearance-Synovial CLOUDY Abnormal   Crystals, Fluid EXTRACELLULAR CALCIUM  PYROPHOSPHATE CRYSTALS  Comment: INTRACELLULAR CALCIUM  PYROPHOSPHATE CRYSTALS  WBC, Synovial 24,791 High   Neutrophil, Synovial 93 High   Lymphocytes-Synovial Fld 1  Monocyte-Macrophage-Synovial Fluid 6 Low   Eosinophils-Synovial 0    Pressure injury of skin Agree with RN documentation regarding pressure injury to skin.  Pressure Injury 01/27/24 Heel Right Unstageable - Full thickness tissue loss in which the base of the injury is covered by slough (yellow, tan, gray, green or brown) and/or eschar (tan, brown or black) in the wound bed. (Active)  01/27/24 2000  Location: Heel  Location Orientation: Right  Staging:  Unstageable - Full thickness tissue loss in which the base of the injury is covered by slough (yellow, tan, gray, green or brown) and/or eschar (tan, brown or black) in the wound bed.  Wound Description (Comments):   Present on Admission: Yes     Pressure Injury 01/27/24 Heel Right Stage 2 -  Partial thickness loss of dermis presenting as a shallow open injury with a red, pink wound bed without slough. (Active)  01/27/24 2000  Location: Heel  Location Orientation: Right  Staging: Stage 2 -  Partial thickness loss of dermis presenting as a shallow open injury with a red, pink wound bed without slough.  Wound Description (Comments):   Present on Admission: Yes     Pressure Injury 01/27/24 Heel Left Stage 2 -  Partial thickness loss of dermis presenting as a shallow open injury with a red, pink wound bed without slough. (Active)  01/27/24 2000  Location: Heel   Location Orientation: Left  Staging: Stage 2 -  Partial thickness loss of dermis presenting as a shallow open injury with a red, pink wound bed without slough.  Wound Description (Comments):   Present on Admission: Yes      Essential hypertension On admission through 01-28-2024 Blood pressure within goal, being maintained with diuretics. Not on any other antihypertensives at home. -Continue to monitor  01-30-2024 stable. HTN meds on hold due to borderline low BP  01-31-2024 stable. HTN meds still on hold due to borderline low BP.  05-10-205 stable.  Dyslipidemia On admission through 01-28-2024. Continue atorvastatin   01-30-2024 stable.  01-31-2024 stable  05-10-205 stable.  Type II diabetes mellitus with renal manifestations (HCC) On admission through 01-28-2024 Will place the patient on supplement coverage with NovoLog . - Holding home glipizide  and metformin .  01-29-2024 stable.  01-30-2024 stable.  01-31-2024 stable  05-10-205 stable.   DVT prophylaxis: Place TED hose Start: 01/29/24 0753  IV Heparin    Code Status: Full Code Family Communication: talked to pt's dtr [name] yesterday by phone Disposition Plan: unknown. Probably SNF Reason for continuing need for hospitalization: remains on IV ABX, IV heparin , still needs TEE  Objective: Vitals:   02/01/24 0400 02/01/24 0500 02/01/24 0700 02/01/24 1100  BP: 111/64   123/69  Pulse: 87   86  Resp: 19   14  Temp: 98 F (36.7 C)  97.6 F (36.4 C) 97.7 F (36.5 C)  TempSrc: Oral  Oral Oral  SpO2: 97%   98%  Weight:  100.7 kg    Height:        Intake/Output Summary (Last 24 hours) at 02/01/2024 1141 Last data filed at 02/01/2024 1100 Gross per 24 hour  Intake 0 ml  Output 2600 ml  Net -2600 ml   Filed Weights   01/30/24 0518 01/31/24 0508 02/01/24 0500  Weight: 107.3 kg 103 kg 100.7 kg    Examination:  Physical Exam Vitals and nursing note reviewed.  Constitutional:      Comments: More awake today.  Still looks ill  HENT:     Head: Normocephalic and atraumatic.  Cardiovascular:     Rate and Rhythm: Normal rate and regular rhythm.     Comments: Vpaced rhythm on bedside telemetry Pulmonary:     Effort: Pulmonary effort is normal. No respiratory distress.     Breath sounds: No wheezing.  Abdominal:     General: Bowel sounds are normal.     Palpations: Abdomen is soft.  Musculoskeletal:     Right lower leg: Edema present.  Left lower leg: Edema present.     Comments: +1 pitting pretibial edema  Skin:    General: Skin is warm.     Capillary Refill: Capillary refill takes less than 2 seconds.  Neurological:     Comments: More awake today. Knows that he is in the hospital.     Data Reviewed: I have personally reviewed following labs and imaging studies  CBC: Recent Labs  Lab 01/26/24 1936 01/27/24 0151 01/28/24 0401 01/29/24 0701 01/30/24 0248 01/31/24 0612 02/01/24 0455  WBC 18.2*   < > 13.0* 9.4 8.3 8.3 9.4  NEUTROABS 16.2*  --   --   --   --   --   --   HGB 11.5*   < > 10.3* 10.4* 9.7* 9.6* 10.6*  HCT 36.3*   < > 32.6* 32.4* 30.4* 30.7* 33.9*  MCV 88.5   < > 87.4 85.9 86.1 87.2 85.8  PLT 164   < > 117* 120* 123* 152 173   < > = values in this interval not displayed.   Basic Metabolic Panel: Recent Labs  Lab 01/28/24 0401 01/29/24 0701 01/30/24 0248 01/31/24 0612 02/01/24 0455  NA 138 136 136 133* 139  K 3.3* 3.9 3.3* 3.4* 3.5  CL 105 103 102 98 103  CO2 18* 20* 22 23 24   GLUCOSE 120* 160* 176* 189* 165*  BUN 61* 86* 98* 98* 86*  CREATININE 2.05* 2.33* 2.44* 2.13* 1.53*  CALCIUM  8.3* 8.3* 8.5* 8.4* 9.0  MG  --   --  2.4  --   --    GFR: Estimated Creatinine Clearance: 50.6 mL/min (A) (by C-G formula based on SCr of 1.53 mg/dL (H)). Liver Function Tests: Recent Labs  Lab 01/26/24 1936 01/27/24 2043  AST 24 28  ALT 24 22  ALKPHOS 78 58  BILITOT 0.8 1.2  PROT 7.1 6.9  ALBUMIN 3.8 3.2*   Recent Labs  Lab 01/31/24 0956  AMMONIA 19    Coagulation Profile: Recent Labs  Lab 01/27/24 2043  INR 1.5*   BNP (last 3 results) Recent Labs    09/11/23 1627 10/28/23 1107 01/27/24 0844  BNP 3,874.2* 754.7* 4,043.5*   CBG: Recent Labs  Lab 01/31/24 1157 01/31/24 1558 01/31/24 2136 02/01/24 0741 02/01/24 1136  GLUCAP 176* 181* 151* 153* 176*   Sepsis Labs: Recent Labs  Lab 01/27/24 1826 01/27/24 2043 01/27/24 2207  PROCALCITON  --  7.52  --   LATICACIDVEN 2.3* 1.5 1.4    Recent Results (from the past 240 hours)  Culture, blood (x 2)     Status: Abnormal   Collection Time: 01/27/24  8:43 PM   Specimen: BLOOD  Result Value Ref Range Status   Specimen Description   Final    BLOOD BLOOD LEFT ARM LAC Performed at [facility], [address], [place], [place] [address]    Special Requests   Final    BOTTLES DRAWN AEROBIC AND ANAEROBIC Blood Culture adequate volume Performed at [facility], [address] [place], [place] [address]    Culture  Setup Time   Final    GRAM POSITIVE COCCI IN BOTH AEROBIC AND ANAEROBIC BOTTLES CRITICAL RESULT CALLED TO, READ BACK BY AND VERIFIED WITH: TREY GREENWOOD 01/28/24 0811 MW GRAM STAIN REVIEWED-AGREE WITH RESULT DRT    Culture (A)  Final    GROUP B STREP(S.AGALACTIAE)ISOLATED SUSCEPTIBILITIES PERFORMED ON PREVIOUS CULTURE WITHIN THE LAST 5 DAYS. Performed at [facility], [address], [place], [place] [address]  Report Status 01/30/2024 FINAL  Final  Culture, blood (x 2)     Status: Abnormal   Collection Time: 01/27/24  8:49 PM   Specimen: BLOOD  Result Value Ref Range Status   Specimen Description   Final    BLOOD BLOOD LEFT HAND [facility] Performed at [facility], [address], [place], [place] [address]    Special Requests   Final    BOTTLES DRAWN AEROBIC AND ANAEROBIC Blood Culture adequate volume Performed at [facility], [address], [place], [place] [address]    Culture  Setup Time   Final    GRAM  POSITIVE COCCI IN BOTH AEROBIC AND ANAEROBIC BOTTLES Organism ID to follow CRITICAL RESULT CALLED TO, READ BACK BY AND VERIFIED WITH: Kathyanne Parkers GREENWOOD 01/28/24 0811 MW GRAM STAIN REVIEWED-AGREE WITH RESULT DRT Performed at [facility], [address], [place], [place] [address]    Culture GROUP B STREP(S.AGALACTIAE)ISOLATED (A)  Final   Report Status 01/30/2024 FINAL  Final   Organism ID, Bacteria GROUP B STREP(S.AGALACTIAE)ISOLATED  Final      Susceptibility   Group b strep(s.agalactiae)isolated - MIC*    CLINDAMYCIN RESISTANT Resistant     AMPICILLIN <=0.25 SENSITIVE Sensitive     ERYTHROMYCIN >=8 RESISTANT Resistant     VANCOMYCIN  0.5 SENSITIVE Sensitive     CEFTRIAXONE  <=0.12 SENSITIVE Sensitive     LEVOFLOXACIN  0.5 SENSITIVE Sensitive     PENICILLIN <=0.06 SENSITIVE Sensitive     * GROUP B STREP(S.AGALACTIAE)ISOLATED  Blood Culture ID Panel (Reflexed)     Status: Abnormal   Collection Time: 01/27/24  8:49 PM  Result Value Ref Range Status   Enterococcus faecalis NOT DETECTED NOT DETECTED Final   Enterococcus Faecium NOT DETECTED NOT DETECTED Final   Listeria monocytogenes NOT DETECTED NOT DETECTED Final   Staphylococcus species NOT DETECTED NOT DETECTED Final   Staphylococcus aureus (BCID) NOT DETECTED NOT DETECTED Final   Staphylococcus epidermidis NOT DETECTED NOT DETECTED Final   Staphylococcus lugdunensis NOT DETECTED NOT DETECTED Final   Streptococcus species DETECTED (A) NOT DETECTED Final    Comment: CRITICAL RESULT CALLED TO, READ BACK BY AND VERIFIED WITH: TREY GREENWOOD 01/28/24 0811 MW    Streptococcus agalactiae DETECTED (A) NOT DETECTED Final    Comment: CRITICAL RESULT CALLED TO, READ BACK BY AND VERIFIED WITH: TREY GREENWOOD 01/28/24 0811 MW    Streptococcus pneumoniae NOT DETECTED NOT DETECTED Final   Streptococcus pyogenes NOT DETECTED NOT DETECTED Final   A.calcoaceticus-baumannii NOT DETECTED NOT DETECTED Final   Bacteroides fragilis NOT DETECTED NOT  DETECTED Final   Enterobacterales NOT DETECTED NOT DETECTED Final   Enterobacter cloacae complex NOT DETECTED NOT DETECTED Final   Escherichia coli NOT DETECTED NOT DETECTED Final   Klebsiella aerogenes NOT DETECTED NOT DETECTED Final   Klebsiella oxytoca NOT DETECTED NOT DETECTED Final   Klebsiella pneumoniae NOT DETECTED NOT DETECTED Final   Proteus species NOT DETECTED NOT DETECTED Final   Salmonella species NOT DETECTED NOT DETECTED Final   Serratia marcescens NOT DETECTED NOT DETECTED Final   Haemophilus influenzae NOT DETECTED NOT DETECTED Final   Neisseria meningitidis NOT DETECTED NOT DETECTED Final   Pseudomonas aeruginosa NOT DETECTED NOT DETECTED Final   Stenotrophomonas maltophilia NOT DETECTED NOT DETECTED Final   Candida albicans NOT DETECTED NOT DETECTED Final   Candida auris NOT DETECTED NOT DETECTED Final   Candida glabrata NOT DETECTED NOT DETECTED Final   Candida krusei NOT DETECTED NOT DETECTED Final   Candida parapsilosis NOT  DETECTED NOT DETECTED Final   Candida tropicalis NOT DETECTED NOT DETECTED Final   Cryptococcus neoformans/gattii NOT DETECTED NOT DETECTED Final    Comment: Performed at [facility], [address] [place], [place] [address]  MRSA Next Gen by PCR, Nasal     Status: None   Collection Time: 01/28/24  1:00 AM   Specimen: Nasal Mucosa; Nasal Swab  Result Value Ref Range Status   MRSA by PCR Next Gen NOT DETECTED NOT DETECTED Final    Comment: (NOTE) The GeneXpert MRSA Assay (FDA approved for NASAL specimens only), is one component of a comprehensive MRSA colonization surveillance program. It is not intended to diagnose MRSA infection nor to guide or monitor treatment for MRSA infections. Test performance is not FDA approved in patients less than 82 years old. Performed at [facility], [address] [place], [place] [address]   Aerobic Culture w Gram Stain (superficial specimen)     Status: None   Collection Time:  01/29/24  6:35 AM   Specimen: Foot; Wound  Result Value Ref Range Status   Specimen Description   Final    FOOT RIGHT Performed at [facility], [address] [place], [place] [address]    Special Requests   Final    NONE Performed at [facility], [address] [place], [place] [address]    Gram Stain NO WBC SEEN RARE GRAM POSITIVE COCCI IN PAIRS   Final   Culture   Final    RARE STAPHYLOCOCCUS AUREUS SUSCEPTIBILITIES PERFORMED ON PREVIOUS CULTURE WITHIN THE LAST 5 DAYS. ABUNDANT CORYNEBACTERIUM STRIATUM Standardized susceptibility testing for this organism is not available. Performed at [facility], [address] [place], [place] [address]    Report Status 02/01/2024 FINAL  Final  Aerobic Culture w Gram Stain (superficial specimen)     Status: None   Collection Time: 01/29/24  6:37 AM   Specimen: Foot; Wound  Result Value Ref Range Status   Specimen Description   Final    FOOT LEFT Performed at [facility], [address] [place], [place] [address]    Special Requests   Final    NONE Performed at [facility], [address] [place], [place] [address]    Gram Stain   Final    NO WBC SEEN RARE GRAM POSITIVE COCCI IN PAIRS Performed at [facility], [address] [place], [place] [address]    Culture   Final    RARE METHICILLIN RESISTANT STAPHYLOCOCCUS AUREUS RARE ENTEROCOCCUS FAECALIS RARE GROUP B STREP(S.AGALACTIAE)ISOLATED TESTING AGAINST S. AGALACTIAE NOT ROUTINELY PERFORMED DUE TO PREDICTABILITY OF AMP/PEN/VAN SUSCEPTIBILITY. RARE FUNGUS (MOLD) ISOLATED, PROBABLE CONTAMINANT/COLONIZER (SAPROPHYTE). CONTACT MICROBIOLOGY IF FURTHER IDENTIFICATION REQUIRED [phone].    Report Status 02/01/2024 FINAL  Final   Organism ID, Bacteria METHICILLIN RESISTANT STAPHYLOCOCCUS AUREUS  Final   Organism ID, Bacteria ENTEROCOCCUS FAECALIS  Final      Susceptibility   Enterococcus faecalis - MIC*    AMPICILLIN  <=2 SENSITIVE Sensitive     VANCOMYCIN  1 SENSITIVE Sensitive     GENTAMICIN  SYNERGY SENSITIVE Sensitive     * RARE ENTEROCOCCUS FAECALIS   Methicillin resistant staphylococcus aureus - MIC*    CIPROFLOXACIN  >=8 RESISTANT Resistant     ERYTHROMYCIN >=8 RESISTANT Resistant     GENTAMICIN  <=0.5 SENSITIVE Sensitive     OXACILLIN >=4 RESISTANT Resistant     TETRACYCLINE <=1 SENSITIVE Sensitive     VANCOMYCIN  1 SENSITIVE Sensitive  TRIMETH /SULFA  <=10 SENSITIVE Sensitive     CLINDAMYCIN <=0.25 SENSITIVE Sensitive     RIFAMPIN <=0.5 SENSITIVE Sensitive     Inducible Clindamycin NEGATIVE Sensitive     LINEZOLID 2 SENSITIVE Sensitive     * RARE METHICILLIN RESISTANT STAPHYLOCOCCUS AUREUS  Culture, blood (Routine X 2) w Reflex to ID Panel     Status: None (Preliminary result)   Collection Time: 01/29/24  6:11 PM   Specimen: BLOOD  Result Value Ref Range Status   Specimen Description BLOOD BLOOD RIGHT HAND  Final   Special Requests   Final    BOTTLES DRAWN AEROBIC AND ANAEROBIC Blood Culture adequate volume   Culture   Final    NO GROWTH 3 DAYS Performed at [facility], [address], [place], [place] [address]    Report Status PENDING  Incomplete  Culture, blood (Routine X 2) w Reflex to ID Panel     Status: None (Preliminary result)   Collection Time: 01/29/24  7:42 PM   Specimen: BLOOD  Result Value Ref Range Status   Specimen Description BLOOD BLOOD RIGHT HAND  Final   Special Requests   Final    BOTTLES DRAWN AEROBIC AND ANAEROBIC Blood Culture adequate volume   Culture   Final    NO GROWTH 3 DAYS Performed at [facility], [address] [place], [place] [address]    Report Status PENDING  Incomplete  Body fluid culture w Gram Stain     Status: None (Preliminary result)   Collection Time: 01/30/24  4:00 PM   Specimen: Synovium; Body Fluid  Result Value Ref Range Status   Specimen Description   Final    SYNOVIAL Performed at [facility],  [address], [place], [place] [address]    Special Requests   Final    L KNEE Performed at [facility], [address] [place], [place] [address]    Gram Stain   Final    MODERATE WBC PRESENT, PREDOMINANTLY PMN NO ORGANISMS SEEN    Culture   Final    NO GROWTH < 12 HOURS Performed at [facility], [address], [place], [place] [address]    Report Status PENDING  Incomplete     Radiology Studies: DG Knee 1-2 Views Left Result Date: 01/30/2024 CLINICAL DATA:  Knee effusion. EXAM: LEFT KNEE - 2 VIEW COMPARISON:  None Available. FINDINGS: Osteopenia. Joint space loss mildly of the mediolateral compartment and patellofemoral joint with scattered osteophyte formation. Scattered vascular calcifications are identified. There is some chondrocalcinosis as well of the mediolateral compartments. Subchondral cyst formation along the medial tibial plateau. The lateral view are oblique limiting evaluation. No large effusion but evaluation of a a small effusion is limited. Repeat lateral view could be considered versus additional imaging. IMPRESSION: Degenerative changes. Osteopenia. Chondrocalcinosis. No large effusion but lateral views are limited due to rotation. Subtle effusion is not excluded. Further workup as clinically appropriate Electronically Signed   By: Adrianna Horde M.D.   On: 01/30/2024 16:58    Scheduled Meds:  vitamin C   500 mg Oral BID   aspirin   81 mg Oral Daily   atorvastatin   80 mg Oral q1800   Chlorhexidine  Gluconate Cloth  6 each Topical Daily   feeding supplement (NEPRO CARB STEADY)  237 mL Oral BID BM   gentamicin  cream  1 Application Topical BID   insulin  aspart  0-15 Units Subcutaneous TID WC   insulin  aspart  0-5 Units Subcutaneous QHS  multivitamin with minerals  1 tablet Oral Daily   silver  sulfADIAZINE    Topical Daily   Continuous Infusions:  cefTRIAXone  (ROCEPHIN )  IV 2 g (01/31/24 1443)   heparin  2,300 Units/hr (02/01/24 0718)     LOS: 4  days   Time spent: 50 minutes  Unk Garb, DO  [facility]  02/01/2024, 11:41 AM

## 2024-02-01 NOTE — Consult Note (Addendum)
 Pharmacy Antibiotic Note  ASSESSMENT: 78 y.o. male with PMH including CKD, left knee effusion, pseudogout, and left food wound  is presenting with bacteremia secondary to left foot wound. Today foot wound culture finalized with Group B Strep, in addition to MRSA and E. Faecalis.  Blood cultures only grew Group B Strep. ID will follow up with patient next week but in the meantime will escalate to vancomycin  to cover. Pharmacy has been consulted to manage vancomycin  dosing. Patient's renal function is at baseline.  Patient measurements: Height: 6\' 6"  (198.1 cm) Weight: 100.7 kg (222 lb 0.1 oz) IBW/kg (Calculated) : 91.4  Vital signs: Temp: 98.5 F (36.9 C) (05/10 1556) Temp Source: Axillary (05/10 1556) BP: 109/64 (05/10 1556) Pulse Rate: 86 (05/10 1100) Recent Labs  Lab 01/30/24 0248 01/31/24 0612 02/01/24 0455  WBC 8.3 8.3 9.4  CREATININE 2.44* 2.13* 1.53*   Estimated Creatinine Clearance: 50.6 mL/min (A) (by C-G formula based on SCr of 1.53 mg/dL (H)).  Allergies: Allergies  Allergen Reactions   Quinolones Other (See Comments)    Patient has aortic aneurysm - fluoroquinolones are contraindicated due to risk of aortic rupture.   Eliquis  [Apixaban ]     Dizziness  and vision change   Spironolactone  Other (See Comments)    dizziness    Antimicrobials this admission: Cefepime  5/5 >> 5/6 Metronidazole , Vancomycin  5/5 x 1 Ceftriaxone  5/6 >> 5/10 Vancomycin  5/10 >>  Dose adjustments this admission: N/A  Microbiology results: 5/7 BCx: GBS 5/7 Left wound cx: GBS, E. Faecalis (vanc-S), MRSA   PLAN: Administer vancomycin  2250mg  IV once, followed by vancomycin  1750mg  IV q24H eAUC 520, Cmax 36, Cmin 13 Scr 1.53, IBW, Vd 0.72 L/kg Follow up culture results to assess for antibiotic optimization. Monitor renal function to assess for any necessary antibiotic dosing changes.   Thank you for allowing pharmacy to be a part of this patient's care.  Will M. Alva Jewels,  PharmD Clinical Pharmacist 02/01/2024 5:01 PM

## 2024-02-01 NOTE — Progress Notes (Addendum)
 PT Cancellation Note  Patient Details Name: Tyler [name] MRN: [id number] DOB: [birth year]   Cancelled Treatment:     PT attempt. Pt was more awake/alert upon arrival today but still struggles to stay awake throughout minimal activity. Pt was unwilling to attempt OOB but did agree to exercises in the bed. Pt performed several exercises prior to drifting off to sleep. Acute PT will continue our efforts to maximize pt's independence and safety with all ADLs.   1452: Did contact pt's daughter for update from a PT standpoint. Dc recs remain appropriate to maximize independence. Pt's daughter works at a assisted living out of town and is trying to locate a SNF closer to her for when pt is able to DC to next venue.   Koleen Perna 02/01/2024, 2:24 PM

## 2024-02-01 NOTE — Progress Notes (Signed)
   Left foot wound cx growing MRSA and enterococcus. IV rocephin  does that cover either of those organisms.  Group B strep from blood cx.  IV vanco will target all 3 of these organisms.  Will change to IV vanco for now and await ID to see patient on Monday.  Unk Garb, DO [facility]

## 2024-02-01 NOTE — Plan of Care (Signed)
 Problem: Education: Goal: Ability to describe self-care measures that may prevent or decrease complications (Diabetes Survival Skills Education) will improve 02/01/2024 1856 by Karry Paganini, RN Outcome: Progressing 02/01/2024 1844 by Karry Paganini, RN Outcome: Progressing Goal: Individualized Educational Video(s) 02/01/2024 1856 by Karry Paganini, RN Outcome: Progressing 02/01/2024 1844 by Karry Paganini, RN Outcome: Progressing   Problem: Coping: Goal: Ability to adjust to condition or change in health will improve 02/01/2024 1856 by Karry Paganini, RN Outcome: Progressing 02/01/2024 1844 by Karry Paganini, RN Outcome: Progressing   Problem: Fluid Volume: Goal: Ability to maintain a balanced intake and output will improve 02/01/2024 1856 by Karry Paganini, RN Outcome: Progressing 02/01/2024 1844 by Karry Paganini, RN Outcome: Progressing   Problem: Health Behavior/Discharge Planning: Goal: Ability to identify and utilize available resources and services will improve 02/01/2024 1856 by Karry Paganini, RN Outcome: Progressing 02/01/2024 1844 by Karry Paganini, RN Outcome: Progressing Goal: Ability to manage health-related needs will improve 02/01/2024 1856 by Karry Paganini, RN Outcome: Progressing 02/01/2024 1844 by Karry Paganini, RN Outcome: Progressing   Problem: Metabolic: Goal: Ability to maintain appropriate glucose levels will improve 02/01/2024 1856 by Karry Paganini, RN Outcome: Progressing 02/01/2024 1844 by Karry Paganini, RN Outcome: Progressing   Problem: Nutritional: Goal: Maintenance of adequate nutrition will improve 02/01/2024 1856 by Karry Paganini, RN Outcome: Progressing 02/01/2024 1844 by Karry Paganini, RN Outcome: Progressing Goal: Progress toward achieving an optimal weight will improve 02/01/2024 1856 by Karry Paganini, RN Outcome: Progressing 02/01/2024 1844 by Karry Paganini, RN Outcome: Progressing   Problem: Skin  Integrity: Goal: Risk for impaired skin integrity will decrease 02/01/2024 1856 by Karry Paganini, RN Outcome: Progressing 02/01/2024 1844 by Karry Paganini, RN Outcome: Progressing   Problem: Tissue Perfusion: Goal: Adequacy of tissue perfusion will improve 02/01/2024 1856 by Karry Paganini, RN Outcome: Progressing 02/01/2024 1844 by Karry Paganini, RN Outcome: Progressing   Problem: Education: Goal: Knowledge of General Education information will improve Description: Including pain rating scale, medication(s)/side effects and non-pharmacologic comfort measures 02/01/2024 1856 by Karry Paganini, RN Outcome: Progressing 02/01/2024 1844 by Karry Paganini, RN Outcome: Progressing   Problem: Health Behavior/Discharge Planning: Goal: Ability to manage health-related needs will improve 02/01/2024 1856 by Karry Paganini, RN Outcome: Progressing 02/01/2024 1844 by Karry Paganini, RN Outcome: Progressing   Problem: Clinical Measurements: Goal: Ability to maintain clinical measurements within normal limits will improve 02/01/2024 1856 by Karry Paganini, RN Outcome: Progressing 02/01/2024 1844 by Karry Paganini, RN Outcome: Progressing Goal: Will remain free from infection 02/01/2024 1856 by Karry Paganini, RN Outcome: Progressing 02/01/2024 1844 by Karry Paganini, RN Outcome: Progressing Goal: Diagnostic test results will improve 02/01/2024 1856 by Karry Paganini, RN Outcome: Progressing 02/01/2024 1844 by Karry Paganini, RN Outcome: Progressing Goal: Respiratory complications will improve 02/01/2024 1856 by Karry Paganini, RN Outcome: Progressing 02/01/2024 1844 by Karry Paganini, RN Outcome: Progressing Goal: Cardiovascular complication will be avoided 02/01/2024 1856 by Karry Paganini, RN Outcome: Progressing 02/01/2024 1844 by Karry Paganini, RN Outcome: Progressing   Problem: Activity: Goal: Risk for activity intolerance will decrease 02/01/2024 1856 by Karry Paganini, RN Outcome: Progressing 02/01/2024 1844 by Karry Paganini, RN Outcome: Progressing   Problem: Nutrition: Goal: Adequate nutrition will be maintained 02/01/2024 1856 by Karry Paganini, RN Outcome: Progressing 02/01/2024 1844 by Karry Paganini, RN Outcome: Progressing   Problem: Coping: Goal: Level of anxiety will decrease 02/01/2024 1856 by Karry Paganini, RN Outcome: Progressing 02/01/2024 1844 by Karry Paganini, RN Outcome: Progressing   Problem: Elimination: Goal: Will not experience complications related to bowel motility 02/01/2024 1856  by Karry Paganini, RN Outcome: Progressing 02/01/2024 1844 by Karry Paganini, RN Outcome: Progressing Goal: Will not experience complications related to urinary retention 02/01/2024 1856 by Karry Paganini, RN Outcome: Progressing 02/01/2024 1844 by Karry Paganini, RN Outcome: Progressing   Problem: Pain Managment: Goal: General experience of comfort will improve and/or be controlled 02/01/2024 1856 by Karry Paganini, RN Outcome: Progressing 02/01/2024 1844 by Karry Paganini, RN Outcome: Progressing   Problem: Safety: Goal: Ability to remain free from injury will improve 02/01/2024 1856 by Karry Paganini, RN Outcome: Progressing 02/01/2024 1844 by Karry Paganini, RN Outcome: Progressing   Problem: Skin Integrity: Goal: Risk for impaired skin integrity will decrease 02/01/2024 1856 by Karry Paganini, RN Outcome: Progressing 02/01/2024 1844 by Karry Paganini, RN Outcome: Progressing   Problem: Fluid Volume: Goal: Hemodynamic stability will improve 02/01/2024 1856 by Karry Paganini, RN Outcome: Progressing 02/01/2024 1844 by Karry Paganini, RN Outcome: Progressing   Problem: Clinical Measurements: Goal: Diagnostic test results will improve 02/01/2024 1856 by Karry Paganini, RN Outcome: Progressing 02/01/2024 1844 by Karry Paganini, RN Outcome: Progressing Goal: Signs and symptoms of infection will  decrease 02/01/2024 1856 by Karry Paganini, RN Outcome: Progressing 02/01/2024 1844 by Karry Paganini, RN Outcome: Progressing   Problem: Respiratory: Goal: Ability to maintain adequate ventilation will improve 02/01/2024 1856 by Karry Paganini, RN Outcome: Progressing 02/01/2024 1844 by Karry Paganini, RN Outcome: Progressing

## 2024-02-01 NOTE — Progress Notes (Signed)
 ANTICOAGULATION CONSULT NOTE  Pharmacy Consult for heparin  infusion Indication: Atrial fibrillation  Allergies  Allergen Reactions   Quinolones Other (See Comments)    Patient has aortic aneurysm - fluoroquinolones are contraindicated due to risk of aortic rupture.   Eliquis  [Apixaban ]     Dizziness  and vision change   Spironolactone  Other (See Comments)    dizziness    Patient Measurements: Height: 6\' 6"  (198.1 cm) Weight: 100.7 kg (222 lb 0.1 oz) IBW/kg (Calculated) : 91.4 Heparin  Dosing Weight: 115.5 kg  Vital Signs: Temp: 98 F (36.7 C) (05/10 0400) Temp Source: Oral (05/10 0400) BP: 111/64 (05/10 0400) Pulse Rate: 87 (05/10 0400)  Labs: Recent Labs    01/30/24 0248 01/31/24 0612 01/31/24 0743 02/01/24 0455  HGB 9.7* 9.6*  --  10.6*  HCT 30.4* 30.7*  --  33.9*  PLT 123* 152  --  173  HEPARINUNFRC 0.44 >1.10* 0.51 0.37  CREATININE 2.44* 2.13*  --  1.53*    Estimated Creatinine Clearance: 50.6 mL/min (A) (by C-G formula based on SCr of 1.53 mg/dL (H)).   Medical History: Past Medical History:  Diagnosis Date   (HFpEF) heart failure with preserved ejection fraction (HCC) 03/01/2020   a.) TTE 03/01/2020: EF 55-60%, mod MAC, mod AoV sclerosis, triv AR, mild TR, mod MR, RVSP 50-59; b.) TTE 09/10/2020: EF 55-60%, mod MAC, mod AoV sclerosis, mild TR, 3+ MR, RVSP 50-59; c.) TTE 09/26/2020: EF 55-60%, mild LA dil, triv PR, mild MR/TR, RVSP 37-49   Adenoma of left adrenal gland    Anemia    Arthritis    Atrial fibrillation and flutter (HCC)    a.) CHA2DS2VASc = 5 (age x2, HFpEF, HTN, T2DM);  b.) s/p CTI ablation 09/07/2020; c.) rate/rhythm maintained on oral carvedilol ; not on chronic anticoagulation therapy   CAD (coronary artery disease)    Cardiomegaly    CKD (chronic kidney disease), stage III (HCC)    DOE (dyspnea on exertion)    Drug-induced bradycardia    Gangrene of toe of left foot (HCC)    a.) 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   H/O active  rheumatic fever 08/22/2020   Hepatosplenomegaly    History of bilateral cataract extraction    HLD (hyperlipidemia)    Hypertension    Long term current use of aspirin     Lymphedema of both lower extremities    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)    a.) s/p amputation 11/04/2022   Peripheral vascular disease (HCC)    Pleural effusion on right 09/09/2020   a.) s/p RIGHT thoracentesis with 2180 cc yield   Pneumonia    Presence of permanent cardiac pacemaker 09/10/2020   a.) TVP placement 09/10/2020 due to intermittent CHB in setting of urosepsis; b.) s/p PPM placement 09/15/2020: MDT Azure XT DR (SN: [id number])   Pulmonary hypertension (HCC) 03/01/2020   a.) TTE: 03/01/2020: RVSP 50-59; b.) TTE 09/10/2020: RVSP 50-59; c.) TTE 09/26/2020: RVSP 37-49   RA (rheumatoid arthritis) (HCC)    Rheumatic fever    Sepsis (HCC) 09/10/2020   a.) urosepsis --> BC x 2 sets and UC all grew out significant Proteus mirabilis; admitted to [facility] 09/07/2020 - 09/27/2020.   Sick sinus syndrome (HCC)    a.) s/p MDT PPM placement 09/15/2020   T2DM (type 2 diabetes mellitus) (HCC)    Urinary retention    chronic, with indwelling Foley catheter and plans for a suprapubic   Wears dentures    full upper  Assessment: Pt is a 78 yo male presenting to ED complaining of feeling of dizziness and confusion. Peak trop of 1.8K. No DOAC PTA. PMH: HFpEF, OA, afib/flutter, pacemaker, CAD, CKD 3, HLD, HTN, PAD, DM. Of note, patient has previously refused oral anticoagulants.    5/5 0844 HL 0.27    SUBtherapeutic 5/5 1826 HL 0.39    Therapeutic x 1 5/6 0401 HL 0.20    SUBtherapeutic  5/6 1340 HL 0.24    SUBtherapeutic 5/6 2256 HL 0.41    Therapeutic x 1  5/7 0701 HL 0.45    Therapeutic x 2 5/8 0248 HL 0.44    Therapeutic x 3  5/9 0743 HL 0.51    Therapeutic x 4 5/10 0455 HL 0.37  Therapeutic X 5   No bleeding noted. Hgb down slightly to 9.6 and plt count up to 152K. Initial check this  AM was supratherapeutic (>1.1) after being therapeutic for 3 days. Suspect it was an inappropriate draw, so heparin  level was repeat and returned therapeutic. Patient has been on triple therapy, however, clopidogrel  was stopped today.  Goal of Therapy:  Heparin  level 0.3-0.7 units/ml Monitor platelets by anticoagulation protocol: Yes   Plan:  5/10:  HL @ 0455 = 0.37, therapeutic X 5 - will continue pt on current rate and recheck HL on 5/11 with AM labs - CBC daily  Shekera Beavers D, PharmD Clinical Pharmacist 02/01/2024

## 2024-02-02 DIAGNOSIS — N179 Acute kidney failure, unspecified: Secondary | ICD-10-CM | POA: Diagnosis not present

## 2024-02-02 DIAGNOSIS — I214 Non-ST elevation (NSTEMI) myocardial infarction: Secondary | ICD-10-CM | POA: Diagnosis not present

## 2024-02-02 DIAGNOSIS — J9611 Chronic respiratory failure with hypoxia: Secondary | ICD-10-CM | POA: Diagnosis not present

## 2024-02-02 DIAGNOSIS — A401 Sepsis due to streptococcus, group B: Secondary | ICD-10-CM | POA: Diagnosis not present

## 2024-02-02 DIAGNOSIS — I5023 Acute on chronic systolic (congestive) heart failure: Secondary | ICD-10-CM | POA: Diagnosis not present

## 2024-02-02 DIAGNOSIS — Z515 Encounter for palliative care: Secondary | ICD-10-CM | POA: Diagnosis not present

## 2024-02-02 LAB — CBC
HCT: 35.4 % — ABNORMAL LOW (ref 39.0–52.0)
Hemoglobin: 11.1 g/dL — ABNORMAL LOW (ref 13.0–17.0)
MCH: 27.1 pg (ref 26.0–34.0)
MCHC: 31.4 g/dL (ref 30.0–36.0)
MCV: 86.3 fL (ref 80.0–100.0)
Platelets: 216 10*3/uL (ref 150–400)
RBC: 4.1 MIL/uL — ABNORMAL LOW (ref 4.22–5.81)
RDW: 16.2 % — ABNORMAL HIGH (ref 11.5–15.5)
WBC: 9.4 10*3/uL (ref 4.0–10.5)
nRBC: 0 % (ref 0.0–0.2)

## 2024-02-02 LAB — BASIC METABOLIC PANEL WITH GFR
Anion gap: 14 (ref 5–15)
BUN: 74 mg/dL — ABNORMAL HIGH (ref 8–23)
CO2: 21 mmol/L — ABNORMAL LOW (ref 22–32)
Calcium: 8.8 mg/dL — ABNORMAL LOW (ref 8.9–10.3)
Chloride: 104 mmol/L (ref 98–111)
Creatinine, Ser: 1.41 mg/dL — ABNORMAL HIGH (ref 0.61–1.24)
GFR, Estimated: 51 mL/min — ABNORMAL LOW (ref >60)
Glucose, Bld: 171 mg/dL — ABNORMAL HIGH (ref 70–99)
Potassium: 3.8 mmol/L (ref 3.5–5.1)
Sodium: 139 mmol/L (ref 135–145)

## 2024-02-02 LAB — HEPARIN LEVEL (UNFRACTIONATED): Heparin Unfractionated: 0.38 [IU]/mL (ref 0.30–0.70)

## 2024-02-02 LAB — GLUCOSE, CAPILLARY
Glucose-Capillary: 166 mg/dL — ABNORMAL HIGH (ref 70–99)
Glucose-Capillary: 251 mg/dL — ABNORMAL HIGH (ref 70–99)
Glucose-Capillary: 209 mg/dL — ABNORMAL HIGH (ref 70–99)
Glucose-Capillary: 178 mg/dL — ABNORMAL HIGH (ref 70–99)

## 2024-02-02 MED ORDER — BUMETANIDE 1 MG PO TABS
2.0000 mg | ORAL_TABLET | Freq: Every day | ORAL | Status: DC
  Administered 2024-02-02 – 2024-02-06 (×5): 2 mg via ORAL
  Filled 2024-02-02 (×5): qty 2

## 2024-02-02 NOTE — Progress Notes (Signed)
 ANTICOAGULATION CONSULT NOTE  Pharmacy Consult for heparin  infusion Indication: Atrial fibrillation  Allergies  Allergen Reactions   Quinolones Other (See Comments)    Patient has aortic aneurysm - fluoroquinolones are contraindicated due to risk of aortic rupture.   Eliquis  [Apixaban ]     Dizziness  and vision change   Spironolactone  Other (See Comments)    dizziness    Patient Measurements: Height: 6\' 6"  (198.1 cm) Weight: 100.7 kg (222 lb 0.1 oz) IBW/kg (Calculated) : 91.4 Heparin  Dosing Weight: 115.5 kg  Vital Signs: Temp: 97.8 F (36.6 C) (05/11 0400) Temp Source: Axillary (05/11 0400) BP: 115/60 (05/11 0400) Pulse Rate: 87 (05/11 0400)  Labs: Recent Labs    01/31/24 0612 01/31/24 0743 02/01/24 0455 02/02/24 0400  HGB 9.6*  --  10.6* 11.1*  HCT 30.7*  --  33.9* 35.4*  PLT 152  --  173 216  HEPARINUNFRC >1.10* 0.51 0.37 0.38  CREATININE 2.13*  --  1.53* 1.41*    Estimated Creatinine Clearance: 54.9 mL/min (A) (by C-G formula based on SCr of 1.41 mg/dL (H)).   Medical History: Past Medical History:  Diagnosis Date   (HFpEF) heart failure with preserved ejection fraction (HCC) 03/01/2020   a.) TTE 03/01/2020: EF 55-60%, mod MAC, mod AoV sclerosis, triv AR, mild TR, mod MR, RVSP 50-59; b.) TTE 09/10/2020: EF 55-60%, mod MAC, mod AoV sclerosis, mild TR, 3+ MR, RVSP 50-59; c.) TTE 09/26/2020: EF 55-60%, mild LA dil, triv PR, mild MR/TR, RVSP 37-49   Adenoma of left adrenal gland    Anemia    Arthritis    Atrial fibrillation and flutter (HCC)    a.) CHA2DS2VASc = 5 (age x2, HFpEF, HTN, T2DM);  b.) s/p CTI ablation 09/07/2020; c.) rate/rhythm maintained on oral carvedilol ; not on chronic anticoagulation therapy   CAD (coronary artery disease)    Cardiomegaly    CKD (chronic kidney disease), stage III (HCC)    DOE (dyspnea on exertion)    Drug-induced bradycardia    Gangrene of toe of left foot (HCC)    a.) 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   H/O  active rheumatic fever 08/22/2020   Hepatosplenomegaly    History of bilateral cataract extraction    HLD (hyperlipidemia)    Hypertension    Long term current use of aspirin     Lymphedema of both lower extremities    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)    a.) s/p amputation 11/04/2022   Peripheral vascular disease (HCC)    Pleural effusion on right 09/09/2020   a.) s/p RIGHT thoracentesis with 2180 cc yield   Pneumonia    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   Sick sinus syndrome (HCC)    a.) s/p MDT PPM placement 09/15/2020   T2DM (type 2 diabetes mellitus) (HCC)    Urinary retention    chronic, with indwelling Foley catheter and plans for a suprapubic   Wears dentures    full upper  Assessment: Pt is a 78 yo male presenting to ED complaining of feeling of dizziness and confusion. Peak trop of 1.8K. No DOAC PTA. PMH: HFpEF, OA, afib/flutter, pacemaker, CAD, CKD 3, HLD, HTN, PAD, DM. Of note, patient has previously refused oral anticoagulants.    5/5 0844 HL 0.27    SUBtherapeutic 5/5 1826 HL 0.39    Therapeutic x 1 5/6 0401 HL 0.20    SUBtherapeutic  5/6 1340 HL 0.24    SUBtherapeutic 5/6 2256 HL 0.41    Therapeutic x 1  5/7 0701 HL 0.45    Therapeutic x 2 5/8 0248 HL 0.44    Therapeutic x 3  5/9 0743 HL 0.51    Therapeutic x 4 5/10 0455 HL 0.37  Therapeutic X 5 5/11 0400 HL 0.38  Therapeutic x 6  No bleeding noted. Hgb down slightly to 9.6 and  plt count up to 152K. Initial check this AM was supratherapeutic (>1.1) after being therapeutic for 3 days. Suspect it was an inappropriate draw, so heparin  level was repeat and returned therapeutic. Patient has been on triple therapy, however, clopidogrel  was stopped today.  Goal of Therapy:  Heparin  level 0.3-0.7 units/ml Monitor platelets by anticoagulation protocol: Yes   Plan:  5/11:  HL @ 0400 = 0.37, therapeutic X 6 - will continue pt on current rate and recheck HL on 5/11 with AM labs - CBC daily  Coretta Dexter, PharmD, [facility] 02/02/2024 6:16 AM

## 2024-02-02 NOTE — Progress Notes (Signed)
 [facility] Cardiology  CARDIOLOGY PROGRESS NOTE  Patient ID: Tyler [name] MRN: [id number] DOB/AGE: 01-30-1946 78 y.o.  Admit date: 01/26/2024 Referring Physician Dr. Farrel Hones Reason for Consultation acute on chronic heart failure with reduced EF  HPI: Mentation is overall appears improved.  Very tired.  Falling back to sleep in between conversations.  Breathing has improved.ours.  Review of systems complete and found to be negative unless listed above     Past Medical History:  Diagnosis Date   (HFpEF) heart failure with preserved ejection fraction (HCC) 03/01/2020   a.) TTE 03/01/2020: EF 55-60%, mod MAC, mod AoV sclerosis, triv AR, mild TR, mod MR, RVSP 50-59; b.) TTE 09/10/2020: EF 55-60%, mod MAC, mod AoV sclerosis, mild TR, 3+ MR, RVSP 50-59; c.) TTE 09/26/2020: EF 55-60%, mild LA dil, triv PR, mild MR/TR, RVSP 37-49   Adenoma of left adrenal gland    Anemia    Arthritis    Atrial fibrillation and flutter (HCC)    a.) CHA2DS2VASc = 5 (age x2, HFpEF, HTN, T2DM);  b.) s/p CTI ablation 09/07/2020; c.) rate/rhythm maintained on oral carvedilol ; not on chronic anticoagulation therapy   CAD (coronary artery disease)    Cardiomegaly    CKD (chronic kidney disease), stage III (HCC)    DOE (dyspnea on exertion)    Drug-induced bradycardia    Gangrene of toe of left foot (HCC)    a.) 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   H/O active rheumatic fever 08/22/2020   Hepatosplenomegaly    History of bilateral cataract extraction    HLD (hyperlipidemia)    Hypertension    Long term current use of aspirin     Lymphedema of both lower extremities    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)    a.) s/p amputation 11/04/2022   Peripheral vascular disease (HCC)    Pleural effusion on right 09/09/2020   a.) s/p RIGHT thoracentesis with 2180 cc yield   Pneumonia    Presence of permanent cardiac pacemaker 09/10/2020   a.) TVP placement 09/10/2020 due to intermittent CHB in setting of  urosepsis; b.) s/p PPM placement 09/15/2020: MDT Azure XT DR (SN: [id number])   Pulmonary hypertension (HCC) 03/01/2020   a.) TTE: 03/01/2020: RVSP 50-59; b.) TTE 09/10/2020: RVSP 50-59; c.) TTE 09/26/2020: RVSP 37-49   RA (rheumatoid arthritis) (HCC)    Rheumatic fever    Sepsis (HCC) 09/10/2020   a.) urosepsis --> BC x 2 sets and UC all grew out significant Proteus mirabilis; admitted to [facility] 09/07/2020 - 09/27/2020.   Sick sinus syndrome [facility])    a.) s/p MDT PPM placement 09/15/2020   T2DM (type 2 diabetes mellitus) (HCC)    Urinary retention    chronic, with indwelling Foley catheter and plans for a suprapubic   Wears dentures    full upper    Past Surgical History:  Procedure Laterality Date   AMPUTATION TOE Right 11/04/2022   Procedure: AMPUTATION TOE;  Surgeon: Dot Gazella, [name];  [name];  Service: Podiatry;  Laterality: Right;   AMPUTATION TOE Right 09/15/2023   Procedure: AMPUTATION TOE;  Surgeon: Jennefer Moats, [name];  [name];  Service: Orthopedics/Podiatry;  Laterality: Right;  Right hallux amputation   BONE BIOPSY Right 04/05/2023   Procedure: BONE BIOPSY THIRD & FOURTH;  Surgeon: Dot Gazella, [name];  [name];  Service: Podiatry;  Laterality: Right;   CARDIAC ELECTROPHYSIOLOGY STUDY AND ABLATION N/A 09/07/2020   Procedure: CARDIAC EP STUDY AND  ABLATION (CTI)   CATARACT EXTRACTION W/PHACO Right 01/16/2017   Procedure: CATARACT EXTRACTION PHACO AND INTRAOCULAR LENS PLACEMENT (IOC)  Right Complicated;  Surgeon: Annell Kidney, MD;  Location: [facility];  Service: Ophthalmology;  Laterality: Right;  IVA Block Healon 5 malyugin vision blue Diabetic - oral meds   CATARACT EXTRACTION W/PHACO Left 10/23/2022   Procedure: CATARACT EXTRACTION PHACO AND INTRAOCULAR LENS PLACEMENT (IOC) LEFT DIABETIC;  Surgeon: Clair Crews, MD;  Location: [facility];  Service: Ophthalmology;  Laterality: Left;  Diabetic    COLONOSCOPY     INCISION AND DRAINAGE OF WOUND Left 09/15/2023   Procedure: IRRIGATION AND DEBRIDEMENT WOUND;  Surgeon: Jennefer Moats, [name];  [name];  Service: Orthopedics/Podiatry;  Laterality: Left;  Left foot wound debridement, possible graft/biopsy   LOWER EXTREMITY ANGIOGRAPHY Right 11/02/2022   Procedure: Lower Extremity Angiography;  Surgeon: Celso College, MD;  Location: [facility];  Service: Cardiovascular;  Laterality: Right;   LOWER EXTREMITY ANGIOGRAPHY Right 09/13/2023   Procedure: Lower Extremity Angiography;  Surgeon: Jackquelyn Mass, MD;  Location: [facility];  Service: Cardiovascular;  Laterality: Right;   METATARSAL HEAD EXCISION Left 07/05/2018   Procedure: RESECTION FIRST METATARSAL INFECTED BONE AND SOFT TISSUE;  Surgeon: Sharlyn Deaner, [name];  [name];  Service: Podiatry;  Laterality: Left;   METATARSAL HEAD EXCISION Right 04/05/2023   Procedure: METATARSAL HEAD EXCISION THIRD & FOURTH;  Surgeon: Dot Gazella, [name];  [name];  Service: Podiatry;  Laterality: Right;   PACEMAKER INSERTION  09/15/2020   TOE AMPUTATION Left    TONSILLECTOMY      Medications Prior to Admission  Medication Sig Dispense Refill Last Dose/Taking   acetaminophen  (TYLENOL ) 500 MG tablet Take 2 tablets (1,000 mg total) by mouth 3 (three) times daily as needed (for pain).   Taking As Needed   aspirin  EC 81 MG tablet Take 1 tablet (81 mg total) by mouth daily. Swallow whole. 30 tablet 0 01/25/2024   atorvastatin  (LIPITOR ) 10 MG tablet Take 1 tablet (10 mg total) by mouth daily at 6 PM. 30 tablet 11 Taking   bumetanide  (BUMEX ) 2 MG tablet Take 1 tablet (2 mg total) by mouth daily. 90 tablet 3 Past Week   clopidogrel  (PLAVIX ) 75 MG tablet Take 1 tablet (75 mg total) by mouth daily with breakfast. 30 tablet 11 01/25/2024 Morning   Finerenone  (KERENDIA ) 10 MG TABS Take 1 tablet (10 mg total) by mouth daily. 30 tablet 5 Past Week   furosemide  (LASIX )  40 MG tablet Take 40 mg by mouth 2 (two) times daily.   01/25/2024   gentamicin  cream (GARAMYCIN ) 0.1 % Apply 1 Application topically 2 (two) times daily. 30 g 1 Taking   glipiZIDE  (GLUCOTROL ) 10 MG tablet Take 1 tablet (10 mg total) by mouth 2 (two) times daily. 60 tablet 2 Past Month   losartan  (COZAAR ) 25 MG tablet Take 25 mg by mouth daily.   Taking   metolazone  (ZAROXOLYN ) 2.5 MG tablet Take 1 tablet (2.5 mg total) by mouth 2 (two) times a week. On Mondays & Fridays   Past Month   Multiple Vitamins-Minerals (MULTIVITAMIN WITH MINERALS) tablet Take 1 tablet by mouth daily.   01/25/2024   silver  sulfADIAZINE  (SILVADENE ) 1 % cream Apply to affected area daily 50 g 1 Taking   doxycycline  (VIBRA -TABS) 100 MG tablet Take 1 tablet (100 mg total) by mouth 2 (two) times daily. (Patient not taking: Reported on 01/26/2024) 20 tablet 0 Not Taking  metFORMIN  (GLUCOPHAGE ) 1000 MG tablet Take 1 tablet (1,000 mg total) by mouth 2 (two) times daily. (Patient not taking: Reported on 01/26/2024) 60 tablet 2 Not Taking   Social History   Socioeconomic History   Marital status: Single    Spouse name: Not on file   Number of children: Not on file   Years of education: Not on file   Highest education level: Not on file  Occupational History    Comment: [employer]  Tobacco Use   Smoking status: Never   Smokeless tobacco: Never  Vaping Use   Vaping status: Never Used  Substance and Sexual Activity   Alcohol use: Not Currently    Comment: rarely   Drug use: Never   Sexual activity: Not Currently    Birth control/protection: None  Other Topics Concern   Not on file  Social History Narrative   Lives with roommate   Social Drivers of Health   Financial Resource Strain: Low Risk  (03/20/2023)   [facility] from [facility], [facility]   Overall Financial Resource Strain (CARDIA)    Difficulty of Paying Living Expenses: Not hard at all  Food Insecurity: No Food Insecurity (01/27/2024)   Hunger  Vital Sign    Worried About Running Out of Food in the Last Year: Never true    Ran Out of Food in the Last Year: Never true  Transportation Needs: No Transportation Needs (01/27/2024)   PRAPARE - Administrator, Civil Service (Medical): No    Lack of Transportation (Non-Medical): No  Physical Activity: Not on file  Stress: No Stress Concern Present (09/07/2020)   Received from [facility], [facility] - Occupational Stress Questionnaire    Feeling of Stress : Not at all  Social Connections: Socially Isolated (01/27/2024)   Social Connection and Isolation Panel [NHANES]    Frequency of Communication with Friends and Family: More than three times a week    Frequency of Social Gatherings with Friends and Family: Twice a week    Attends Religious Services: Never    Database administrator or Organizations: No    Attends Banker Meetings: Never    Marital Status: Divorced  Catering manager Violence: Not At Risk (01/27/2024)   Humiliation, Afraid, Rape, and Kick questionnaire    Fear of Current or Ex-Partner: No    Emotionally Abused: No    Physically Abused: No    Sexually Abused: No    Family History  Problem Relation Age of Onset   Cancer Niece       Review of systems complete and found to be negative unless listed above      PHYSICAL EXAM  Altered but mentation appears improved compared to yesterday +1 pedal edema No wheeze  Labs:   Lab Results  Component Value Date   WBC 9.4 02/02/2024   HGB 11.1 (L) 02/02/2024   HCT 35.4 (L) 02/02/2024   MCV 86.3 02/02/2024   PLT 216 02/02/2024    Recent Labs  Lab 01/27/24 2043 01/28/24 0401 02/02/24 0400  NA 136   < > 139  K 4.0   < > 3.8  CL 108   < > 104  CO2 19*   < > 21*  BUN 61*   < > 74*  CREATININE 1.82*   < > 1.41*  CALCIUM  8.8*   < > 8.8*  PROT 6.9  --   --   BILITOT 1.2  --   --  ALKPHOS 58  --   --   ALT 22  --   --   AST 28  --   --    GLUCOSE 145*   < > 171*   < > = values in this interval not displayed.   Lab Results  Component Value Date   TROPONINI 0.05 (HH) 07/03/2018    Lab Results  Component Value Date   CHOL 148 07/03/2023   CHOL 102 07/07/2014   Lab Results  Component Value Date   HDL 31 (L) 07/03/2023   HDL 25 (L) 07/07/2014   Lab Results  Component Value Date   LDLCALC 95 07/03/2023   LDLCALC 55 07/07/2014   Lab Results  Component Value Date   TRIG 111 07/03/2023   TRIG 112 07/07/2014   Lab Results  Component Value Date   CHOLHDL 4.8 07/03/2023   No results found for: "LDLDIRECT"    Radiology: DG Knee 1-2 Views Left Result Date: 01/30/2024 CLINICAL DATA:  Knee effusion. EXAM: LEFT KNEE - 2 VIEW COMPARISON:  None Available. FINDINGS: Osteopenia. Joint space loss mildly of the mediolateral compartment and patellofemoral joint with scattered osteophyte formation. Scattered vascular calcifications are identified. There is some chondrocalcinosis as well of the mediolateral compartments. Subchondral cyst formation along the medial tibial plateau. The lateral view are oblique limiting evaluation. No large effusion but evaluation of a a small effusion is limited. Repeat lateral view could be considered versus additional imaging. IMPRESSION: Degenerative changes. Osteopenia. Chondrocalcinosis. No large effusion but lateral views are limited due to rotation. Subtle effusion is not excluded. Further workup as clinically appropriate Electronically Signed   By: Adrianna Horde M.D.   On: 01/30/2024 16:58   US  EKG SITE RITE Result Date: 01/29/2024 If Site Rite image not attached, placement could not be confirmed due to current cardiac rhythm.  ECHOCARDIOGRAM COMPLETE Result Date: 01/29/2024    ECHOCARDIOGRAM REPORT   Patient Name:   RASHIEM ADMIRE Date of Exam: 01/28/2024 Medical Rec #:  [id number]      Height:       78.0 in Accession #:    [id number]     Weight:       260.8 lb Date of Birth:  Feb 10, 1946      BSA:           2.527 m Patient Age:    77 years       BP:           103/75 mmHg Patient Gender: M              HR:           96 bpm. Exam Location:  [facility] Procedure: 2D Echo, Cardiac Doppler and Color Doppler (Both Spectral and Color            Flow Doppler were utilized during procedure). Indications:     NSTEMI I21.4  History:         Patient has no prior history of Echocardiogram examinations,                  most recent 07/04/2023. CHF and Cardiomyopathy, Previous                  Myocardial Infarction, Pacemaker, PAD and CKD, stage 3,                  Arrythmias:Bradycardia, Atrial Fibrillation and Atrial Flutter,  Signs/Symptoms:Dyspnea and Syncope; Risk Factors:Diabetes,                  Hypertension and Dyslipidemia.  Sonographer:     Terrilee Few RCS Referring Phys:  [id number] CARALYN HUDSON Diagnosing Phys: Lida Reeks Blythe Veach IMPRESSIONS  1. Left ventricular ejection fraction, by estimation, is 25 to 30%. The left ventricle has severely decreased function. The left ventricle demonstrates global hypokinesis. There is mild left ventricular hypertrophy. Left ventricular diastolic parameters  are indeterminate.  2. Right ventricular systolic function is moderately reduced. The right ventricular size is mildly enlarged. There is severely elevated pulmonary artery systolic pressure. The estimated right ventricular systolic pressure is 73.9 mmHg.  3. Left atrial size was mildly dilated.  4. Right atrial size was moderately dilated.  5. Eccentric mitral regurgitation which can be underestimated on this study. Recommend TEE for further evaluation. Moderate to severe mitral valve regurgitation. Mild to moderate mitral stenosis. Severe mitral annular calcification.  6. Sclerotic aortic valve with low flow low gradient aortic stenosis, probably mild to moderate.. The aortic valve is tricuspid.  7. Aortic dilatation noted. There is mild dilatation of the aortic root, measuring 44 mm. FINDINGS  Left Ventricle: Left  ventricular ejection fraction, by estimation, is 25 to 30%. The left ventricle has severely decreased function. The left ventricle demonstrates global hypokinesis. There is mild left ventricular hypertrophy. Left ventricular diastolic parameters are indeterminate. Right Ventricle: The right ventricular size is mildly enlarged. No increase in right ventricular wall thickness. Right ventricular systolic function is moderately reduced. There is severely elevated pulmonary artery systolic pressure. The tricuspid regurgitant velocity is 4.06 m/s, and with an assumed right atrial pressure of 8 mmHg, the estimated right ventricular systolic pressure is 73.9 mmHg. Left Atrium: Left atrial size was mildly dilated. Right Atrium: Right atrial size was moderately dilated. Pericardium: There is no evidence of pericardial effusion. Mitral Valve: Eccentric mitral regurgitation which can be underestimated on this study. Recommend TEE for further evaluation. There is mild thickening of the mitral valve leaflet(s). There is mild calcification of the mitral valve leaflet(s). Severe mitral annular calcification. Moderate to severe mitral valve regurgitation, with anteriorly-directed jet. Mild to moderate mitral valve stenosis. MV peak gradient, 14.7 mmHg. The mean mitral valve gradient is 8.4 mmHg. Tricuspid Valve: The tricuspid valve is normal in structure. Tricuspid valve regurgitation is mild. Aortic Valve: Sclerotic aortic valve with low flow low gradient aortic stenosis, probably mild to moderate. The aortic valve is tricuspid. Aortic valve mean gradient measures 10.0 mmHg. Aortic valve peak gradient measures 16.4 mmHg. Aortic valve area, by  VTI measures 1.43 cm. Pulmonic Valve: The pulmonic valve was not well visualized. Pulmonic valve regurgitation is trivial. Aorta: Aortic dilatation noted. There is mild dilatation of the aortic root, measuring 44 mm. IAS/Shunts: The interatrial septum was not well visualized.  LEFT VENTRICLE  PLAX 2D LVIDd:         5.50 cm   Diastology LVIDs:         4.50 cm   LV e' medial:    5.87 cm/s LV PW:         1.60 cm   LV E/e' medial:  22.5 LV IVS:        1.50 cm   LV e' lateral:   10.80 cm/s LVOT diam:     2.30 cm   LV E/e' lateral: 12.2 LV SV:         53 LV SV Index:   21 LVOT Area:  4.15 cm  RIGHT VENTRICLE            IVC RV S prime:     8.59 cm/s  IVC diam: 3.00 cm TAPSE (M-mode): 1.6 cm LEFT ATRIUM              Index        RIGHT ATRIUM           Index LA diam:        5.90 cm  2.33 cm/m   RA Area:     28.30 cm LA Vol (A2C):   114.0 ml 45.11 ml/m  RA Volume:   112.00 ml 44.32 ml/m LA Vol (A4C):   79.7 ml  31.54 ml/m LA Biplane Vol: 95.1 ml  37.63 ml/m  AORTIC VALVE AV Area (Vmax):    1.77 cm AV Area (Vmean):   1.56 cm AV Area (VTI):     1.43 cm AV Vmax:           202.50 cm/s AV Vmean:          144.500 cm/s AV VTI:            0.375 m AV Peak Grad:      16.4 mmHg AV Mean Grad:      10.0 mmHg LVOT Vmax:         86.13 cm/s LVOT Vmean:        54.333 cm/s LVOT VTI:          0.129 m LVOT/AV VTI ratio: 0.34  AORTA Ao Asc diam: 3.80 cm MITRAL VALVE                TRICUSPID VALVE MV Area (PHT): 2.66 cm     TR Peak grad:   65.9 mmHg MV Area VTI:   1.36 cm     TR Vmax:        406.00 cm/s MV Peak grad:  14.7 mmHg MV Mean grad:  8.4 mmHg     SHUNTS MV Vmax:       1.91 m/s     Systemic VTI:  0.13 m MV Vmean:      134.8 cm/s   Systemic Diam: 2.30 cm MV Decel Time: 285 msec MV E velocity: 132.00 cm/s MV A velocity: 98.50 cm/s MV E/A ratio:  1.34 Joetta Mustache Electronically signed by Joetta Mustache Signature Date/Time: 01/29/2024/1:23:56 PM    Final    CT Angio Chest Pulmonary Embolism (PE) W or WO Contrast Result Date: 01/27/2024 CLINICAL DATA:  Chest pain.  PE suspected EXAM: CT ANGIOGRAPHY CHEST WITH CONTRAST TECHNIQUE: Multidetector CT imaging of the chest was performed using the standard protocol during bolus administration of intravenous contrast. Multiplanar CT image reconstructions and MIPs were  obtained to evaluate the vascular anatomy. RADIATION DOSE REDUCTION: This exam was performed according to the departmental dose-optimization program which includes automated exposure control, adjustment of the mA and/or kV according to patient size and/or use of iterative reconstruction technique. CONTRAST:  75mL OMNIPAQUE  IOHEXOL  350 MG/ML SOLN COMPARISON:  Same day chest radiograph FINDINGS: Cardiovascular: Cardiomegaly. Mitral annular calcification. Coronary artery and aortic atherosclerotic calcification. Mild dilation of the ascending aorta measuring 40 mm in diameter. No pericardial effusion. Negative for acute pulmonary embolism. Left chest wall pacemaker. Mediastinum/Nodes: Trachea and esophagus are unremarkable. Shotty mediastinal lymph nodes are likely reactive. Lungs/Pleura: Small right pleural effusion. Interlobular septal thickening and hazy ground-glass opacities with a lower lung and posterior predominance. No focal consolidation, pleural effusion, or pneumothorax. Upper Abdomen: No acute abnormality.  Musculoskeletal: No acute fracture. Review of the MIP images confirms the above findings. IMPRESSION: 1. Negative for acute pulmonary embolism. 2. Findings suggestive of CHF with mild pulmonary edema. Small right pleural effusion. 3. 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 Recommend annual imaging followup by CTA or MRA. This recommendation follows 2010 ACCF/AHA/AATS/ACR/ASA/SCA/SCAI/SIR/STS/SVM Guidelines for the Diagnosis and Management of Patients with Thoracic Aortic Disease. Circulation. 2010; 121: G956-O130. Aortic aneurysm NOS (ICD10-I71.9) 4. Aortic Atherosclerosis (ICD10-I70.0). Electronically Signed   By: Rozell Cornet M.D.   On: 01/27/2024 22:11   CT Head Wo Contrast Result Date: 01/26/2024 CLINICAL DATA:  Mental status change. EXAM: CT HEAD WITHOUT CONTRAST TECHNIQUE: Contiguous axial images were obtained from the base of the skull through the vertex without intravenous contrast.  RADIATION DOSE REDUCTION: This exam was performed according to the departmental dose-optimization program which includes automated exposure control, adjustment of the mA and/or kV according to patient size and/or use of iterative reconstruction technique. COMPARISON:  Head CT 07/04/2018. FINDINGS: Brain: No evidence of acute infarction, hemorrhage, hydrocephalus, extra-axial collection or mass lesion/mass effect. There is mild diffuse atrophy and mild periventricular white matter hypodensity which is similar to the prior study. Vascular: Atherosclerotic calcifications are present within the cavernous internal carotid arteries. Skull: Normal. Negative for fracture or focal lesion. Sinuses/Orbits: No acute finding. Other: None. IMPRESSION: 1. No acute intracranial process. 2. Mild diffuse atrophy and mild chronic small vessel ischemic changes. Electronically Signed   By: Tyron Gallon M.D.   On: 01/26/2024 20:18   DG Chest Portable 1 View Result Date: 01/26/2024 CLINICAL DATA:  Chest pain EXAM: PORTABLE CHEST 1 VIEW COMPARISON:  09/11/2023 FINDINGS: Left-sided pacing device similar in position. Cardiomegaly with mild central congestion. No consolidation, pleural effusion or pneumothorax. IMPRESSION: Cardiomegaly with mild central congestion. Electronically Signed   By: Esmeralda Hedge M.D.   On: 01/26/2024 20:14    EKG: Paced rhythm on monitor, PVCs improved  ASSESSMENT AND PLAN:  Acute on chronic heart failure with reduced EF NSTEMI type II versus type I AV block status post pacemaker Chronic atrial fibrillation NSVT Strep agalactiae bacteremia AKI on CKD, improving  Mentation better with improving uremia. Feels very tired. Volume status improved Will start po bumex  2 mg BID with goal net negative around 500 cc Likely can initiate GDMT in next few days. Continue heparin  drip, aspirin  Reassess tomorrow am. If doing well, will plan for TEE. Keep NPO after midnight. Eventually he would need ischemic  evaluation, not candidate at this time. Appreciate heart failure and EP recommendations. Will benefit from pacer upgrade to CRT in long run, not candidate at this time   Joetta Mustache, MD [facility] Cardiology- [facility]  Signed: Janette Medley MD 02/02/2024, 7:59 AM

## 2024-02-02 NOTE — Progress Notes (Signed)
 PROGRESS NOTE    Tyler [name]  WUJ:[id number] DOB: [birth year] DOA: 01/26/2024 PCP: McClanahan, Kyra, NP  Subjective: Pt seen and examined. More alert this AM. Still looks tired. Afebrile. Remains on IV heparin . Cardiology notes reviewed. They are going to start diuresis today.   Hospital Course: Taken from H&P.  Tyler [name] is a 78 y.o. 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  He was also experiencing 5/10, nonradiating dull chest pain with no other associated symptoms.  On presentation patient has mild tachycardia.  Labs with BUN of 41, creatinine 1.5, baseline seems to be around 1.3, CO2 of 17, troponin 69>.105 EKG shows A-fib with RVR, PVCs and LBBB. CT head was negative for any acute intracranial abnormality CXR with cardiomegaly and mild central congestion.  Patient was initially started on heparin  infusion and cardiology was consulted.  5/5: Vital stable.  Worsening leukocytosis at 21.9, platelet at 143, none anion gap metabolic acid with worsening of bicarb to 16, BNP significantly elevated at 4043. Troponin continued to increase, currently at 714. Cardiology started him on IV Bumex .  Echocardiogram ordered  5/6: Rapid response was called overnight when patient became very tachycardic and tachypneic, found to be febrile at 102.9.  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 Blood cultures started growing group B streptococcus this morning-antibiotics are being switched to ceftriaxone .  ID was consulted.  Patient also has a pacemaker in place. Rising troponin, maximum recorded at 1830, improving leukocytosis, potassium 3.3 and worsening creatinine at 2.05 so further IV Bumex  was held this morning.  HPI: Tyler [name] is a 78 y.o. Caucasian male with medical history significant for HFpEF, osteoarthritis, atrial fibrillation and flutter, coronary artery disease, stage III chronic kidney disease, dyslipidemia and hypertension, as well as type 2 diabetes mellitus, who presented to the emergency room with a cancer of dizziness, lightheadedness and confusion that started around 4 PM today when he came back home from work.  This was associated with chest pain and dyspnea.  He describes his chest pain as dull aching graded 5/10 in severity with no radiation, nausea or vomiting or diaphoresis or palpitations.  His symptoms later resolved spontaneously.    He feels tactile fever without chills.  No cough or wheezing or vomitus.  No leg pain or edema recent travel or surgery.  No dysuria, oliguria or hematuria or flank pain.  No bleeding diathesis.   The patient was started on IV heparin  per ACS protocol. He will be admitted to a cardiac telemetry observation bed for further evaluation and management.   Significant Events: Admitted 01/26/2024 for chest pain 01-27-2024 blood cx positive for Group B Strep agalactiae  Significant Labs: Na 137, k 4.2, CO2 of 17, BUN 41, Scr 1.5, glu 189 Ca 9.3, TP 71. Alb 3.8 WBC 18.2, HgB 11.5, plt 164 Tropoin I 69-->105-->326 A1c 6.9% BNP 4043 01-27-2024 Blood cx Group B strep agalactiae  Significant Imaging Studies: CT head No acute intracranial process. 2. Mild diffuse atrophy and mild  chronic small vessel ischemic changes CXR Cardiomegaly with mild central congestion  CTPA Negative for acute pulmonary embolism. 2. Findings  suggestive of CHF with mild pulmonary edema. Small right pleural effusion. 3. 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 Recommend annual imaging followup by CTA or MRA. 01-28-2024 echo LVEF 25%  Antibiotic Therapy: Anti-infectives (From admission, onward)    Start     Dose/Rate Route Frequency Ordered Stop   01/28/24 1600  vancomycin  (VANCOREADY) IVPB 2000 mg/400 mL  Status:  Discontinued        2,000 mg 200 mL/hr over 120 Minutes Intravenous Every 24 hours 01/27/24 2009 01/28/24 0844   01/28/24 1600  cefTRIAXone  (ROCEPHIN ) 2 g in sodium chloride  0.9 % 100 mL IVPB        2 g 200 mL/hr over 30 Minutes Intravenous Daily 01/28/24 0844     01/28/24 0800  ceFEPIme  (MAXIPIME ) 2 g in sodium chloride  0.9 % 100 mL IVPB  Status:  Discontinued        2 g 200 mL/hr over 30 Minutes Intravenous Every 12 hours 01/27/24 1950 01/28/24 0844   01/27/24 2030  ceFEPIme  (MAXIPIME ) 2 g in sodium chloride  0.9 % 100 mL IVPB        2 g 200 mL/hr over 30 Minutes Intravenous  Once 01/27/24 1942 01/27/24 2242   01/27/24 2030  metroNIDAZOLE  (FLAGYL ) IVPB 500 mg  Status:  Discontinued        500 mg 100 mL/hr over 60 Minutes Intravenous Every 12 hours 01/27/24 1942 01/28/24 0844   01/27/24 2030  vancomycin  (VANCOCIN ) IVPB 1000 mg/200 mL premix  Status:  Discontinued        1,000 mg 200 mL/hr over 60 Minutes Intravenous  Once 01/27/24 1942 01/27/24 1948   01/27/24 1945  vancomycin  (VANCOREADY) IVPB 2000 mg/400 mL        2,000 mg 200 mL/hr over 120 Minutes Intravenous  Once 01/27/24 1948 01/27/24 2304   01/26/24 2300  doxycycline  (VIBRA -TABS) tablet 100 mg  Status:  Discontinued        100 mg Oral 2 times daily 01/26/24 2254 01/26/24 2346       Procedures:   Consultants: Cardiology CHF EP/Cards ID    Assessment and Plan: * NSTEMI (non-ST elevated myocardial infarction) (HCC) On admission through 01-28-2024 Concern of NSTEMI, type I versus type II Increasing troponin, currently at 714.  Continue to have  some chest discomfort. Cardiology is on board -Continue with heparin  infusion - Likely will get ischemic workup after diuresing - Continue with aspirin , Plavix  and atorvastatin   01-29-2024 remains on IV heparin  gtts. No word yet from cards on ischemic eval.  02-01-2024 remains on IV heparin . Ischemic workup at discretion of cards service.  01-30-2024 remains on IV heparin  gtts  02-01-2024 remains on IV heparin  gtts at discretion of cardiology service.  02-02-2024 remains on IV heparin  gtts at discretion of cardiology service. No decision yet made in regards to ischemic evaluation.  Septicemia due to group B Streptococcus (HCC) 01-29-2024 ID consulted. They want TEE to be performed due to high index of suspicion for endocarditis +/- PPM infection.  Cardiology deferring TEE until next week due to respiratory status.  01-30-2024 concern for endocarditis of valve or PPM leads.  01-31-2024 TEE canceled today by cards due to altered mental status. They will re-evaluate next week. Cards has stated pt is not a candidate for device extraction. Continue with IV ABX under the direction of ID service. Currently on IV rocephin  2 gram daily.  02-01-2024 pt awaiting TEE. Maybe next week. Remains on IV rocephin  2 grams daily.  02-02-2024 yesterday pt's wound cultures from foot growing enterococcus and MRSA. IV ABX changed to IV vanco which will cover group B strep in blood and MRSA/enterococcus from foot wound. NPO after MN for potential TEE in AM.  Acute kidney injury superimposed on stage 3a chronic kidney disease (HCC) - baseline scr 1.3 On admission through 01-28-2024. Worsening creatinine, AKI on chronic CKD stage IIIa. Worsening non-anion gap metabolic acidosis, multifactorial with AKI, CHF and also concern of sepsis. IV Bumex  was held by cardiology today -Giving gentle IV fluid with bicarb infusion -Monitor renal function -Avoid nephrotoxins  01-29-2024 worsening Scr today. Scr up to 2.33  today. Given his low EF I will avoid IVF for now. Cards gave him 160 mg of IV lasix  today.  Defer to cardiology to manage his AKI/CKD if they are continuing his diuresis.  01-30-2024 Scr 2.44 today.  Received 160 mg IV lasix . 2.3 liters in urine out yesterday. Pt states breathing is better. Defer to CHF team for further diuresis.  01-31-2024 scr 2.13, BUN 98 today.  has 4.7 liters in urine yesterday. Diuresis management by cardiology team.  Appears further IV diuretics on hold by cardiology team.  02-01-2024 3.5 liters in urine output. Did not receive any diuretics yesterday. Monitor renal function.  02-02-2024 Scr 1.41, BUN 74 today.  Urine output dropped off yesterday. Only 1.2 liters in urine today. Appears from cardiology note today that bumex  is going to be started. Monitor renal function.  Acute on chronic systolic CHF (congestive heart failure) (HCC) On admission through 01-28-2024 Patient with volume overload, positive JVD and elevated BNP at 4043 Echo cardiogram ordered-pending -Cardiology started him on IV Bumex  2 mg twice daily which was held today due to worsening creatinine -Advanced heart failure team consulted -Continue with metolazone  -Daily weight and BMP -Strict intake and output  01-29-2024 will let cards and CHF team manage pt's volume status with diuretics, etc.  01-30-2024 management per CHF team. Yesterday echo LVEF 25%  01-31-2024 has 4.7 liters in urine yesterday. Diuresis management by cardiology team. Appears further IV diuretics on hold by cardiology team.  02-01-2024 3.5 liters in urine output. Did not receive any diuretics yesterday. Monitor renal function.   02-02-2024 Scr 1.41, BUN 74 today. Urine output dropped off yesterday. Only 1.2 liters in urine today. Appears from cardiology note today that bumex  is going to be started. Monitor renal function.   Sepsis due to cellulitis [facility]) On admission through 01-28-2024 Patient met sepsis criteria after  developing high-grade fever with tachycardia overnight.  Likely due to lower extremity cellulitis and bilateral multiple foot wounds. Received broad-spectrum antibiotics overnight Preliminary blood cultures with group B strep agalactia - De-escalate antibiotics to ceftriaxone  - ID was consulted - Echocardiogram pending  01-29-2024 ID consulting. Concern for potential endocarditis +/- PPM infection.   02-01-2024 pt awaiting TEE. Maybe next week. Remains on IV rocephin  2 grams daily. 01-31-2024 TEE canceled today by cards due to altered mental status. They will re-evaluate next week. Cards has stated pt is not a candidate for device extraction. Continue with IV ABX under the direction of ID service. Currently on IV rocephin  2 gram daily.  01-30-2024 potential for TEE tomorrow. Decision will be made tomorrow AM. NPO after MN in case TEE can be performed tomorrow. Remains on IV ABX.   02-02-2024 yesterday pt's wound cultures from foot growing enterococcus and MRSA. IV ABX changed to IV vanco  which will cover group B strep in blood and MRSA/enterococcus from foot wound  Pseudogout of left knee 01-31-2024 had left knee arthrocentesis by ortho yesterday. Fluid analysis shows pseudogout.  Color, Synovial YELLOW  Appearance-Synovial CLOUDY Abnormal   Crystals, Fluid EXTRACELLULAR CALCIUM  PYROPHOSPHATE CRYSTALS  Comment: INTRACELLULAR CALCIUM  PYROPHOSPHATE CRYSTALS  WBC, Synovial 24,791 High   Neutrophil, Synovial 93 High   Lymphocytes-Synovial Fld 1  Monocyte-Macrophage-Synovial Fluid 6 Low   Eosinophils-Synovial 0    02-02-2024 stable.  Pressure injury of skin Agree with RN documentation regarding pressure injury to skin.  Pressure Injury 01/27/24 Heel Right Unstageable - Full thickness tissue loss in which the base of the injury is covered by slough (yellow, tan, gray, green or brown) and/or eschar (tan, brown or black) in the wound bed. (Active)  01/27/24 2000  Location: Heel   Location Orientation: Right  Staging: Unstageable - Full thickness tissue loss in which the base of the injury is covered by slough (yellow, tan, gray, green or brown) and/or eschar (tan, brown or black) in the wound bed.  Wound Description (Comments):   Present on Admission: Yes     Pressure Injury 01/27/24 Heel Right Stage 2 -  Partial thickness loss of dermis presenting as a shallow open injury with a red, pink wound bed without slough. (Active)  01/27/24 2000  Location: Heel  Location Orientation: Right  Staging: Stage 2 -  Partial thickness loss of dermis presenting as a shallow open injury with a red, pink wound bed without slough.  Wound Description (Comments):   Present on Admission: Yes     Pressure Injury 01/27/24 Heel Left Stage 2 -  Partial thickness loss of dermis presenting as a shallow open injury with a red, pink wound bed without slough. (Active)  01/27/24 2000  Location: Heel  Location Orientation: Left  Staging: Stage 2 -  Partial thickness loss of dermis presenting as a shallow open injury with a red, pink wound bed without slough.  Wound Description (Comments):   Present on Admission: Yes      Essential hypertension On admission through 01-28-2024 Blood pressure within goal, being maintained with diuretics. Not on any other antihypertensives at home. -Continue to monitor  01-30-2024 stable. HTN meds on hold due to borderline low BP  01-31-2024 stable. HTN meds still on hold due to borderline low BP.  05-10-205 stable.  02-02-2024 stable  Dyslipidemia On admission through 01-28-2024. Continue atorvastatin   01-30-2024 stable.  01-31-2024 stable  05-10-205 stable.  02-02-2024 stable  Type II diabetes mellitus with renal manifestations (HCC) On admission through 01-28-2024 Will place the patient on supplement coverage with NovoLog . - Holding home glipizide  and metformin .  01-29-2024 stable.  01-30-2024 stable.  01-31-2024 stable  05-10-205  stable.  02-02-2024 stable. Acceptable CBG ranges.   DVT prophylaxis: Place TED hose Start: 01/29/24 0753  IV Heparin    Code Status: Full Code Family Communication: discussed with pt's dtr Alva Jewels via phone. Gave her update. Disposition Plan: unknown Reason for continuing need for hospitalization: remains on IV ABX, IV heparin .  Objective: Vitals:   02/02/24 0000 02/02/24 0100 02/02/24 0400 02/02/24 0829  BP: 116/64  115/60 110/73  Pulse: 81 86 87 87  Resp: (!) 28 20 (!) 21 20  Temp: 97.9 F (36.6 C)  97.8 F (36.6 C) 98.3 F (36.8 C)  TempSrc: Axillary  Axillary   SpO2: 99% 98% 97% 99%  Weight:      Height:        Intake/Output Summary (Last  24 hours) at 02/02/2024 0919 Last data filed at 02/02/2024 0500 Gross per 24 hour  Intake 100 ml  Output 1200 ml  Net -1100 ml   Filed Weights   01/30/24 0518 01/31/24 0508 02/01/24 0500  Weight: 107.3 kg 103 kg 100.7 kg    Examination:  Physical Exam Vitals and nursing note reviewed.  Constitutional:      General: He is not in acute distress.    Appearance: He is not toxic-appearing.  HENT:     Head: Normocephalic and atraumatic.     Nose: Nose normal.  Eyes:     General: No scleral icterus. Cardiovascular:     Rate and Rhythm: Normal rate and regular rhythm.  Pulmonary:     Effort: Pulmonary effort is normal. No respiratory distress.  Abdominal:     General: Bowel sounds are normal.     Palpations: Abdomen is soft.  Musculoskeletal:     Comments: Woody LE but lots of wrinkles in his skin  Skin:    General: Skin is warm and dry.     Capillary Refill: Capillary refill takes less than 2 seconds.  Neurological:     Mental Status: He is alert. He is disoriented.     Comments: Responsive. More awake than yesterday.    Data Reviewed: I have personally reviewed following labs and imaging studies  CBC: Recent Labs  Lab 01/26/24 1936 01/27/24 0151 01/29/24 0701 01/30/24 0248 01/31/24 0612  02/01/24 0455 02/02/24 0400  WBC 18.2*   < > 9.4 8.3 8.3 9.4 9.4  NEUTROABS 16.2*  --   --   --   --   --   --   HGB 11.5*   < > 10.4* 9.7* 9.6* 10.6* 11.1*  HCT 36.3*   < > 32.4* 30.4* 30.7* 33.9* 35.4*  MCV 88.5   < > 85.9 86.1 87.2 85.8 86.3  PLT 164   < > 120* 123* 152 173 216   < > = values in this interval not displayed.   Basic Metabolic Panel: Recent Labs  Lab 01/29/24 0701 01/30/24 0248 01/31/24 0612 02/01/24 0455 02/02/24 0400  NA 136 136 133* 139 139  K 3.9 3.3* 3.4* 3.5 3.8  CL 103 102 98 103 104  CO2 20* 22 23 24  21*  GLUCOSE 160* 176* 189* 165* 171*  BUN 86* 98* 98* 86* 74*  CREATININE 2.33* 2.44* 2.13* 1.53* 1.41*  CALCIUM  8.3* 8.5* 8.4* 9.0 8.8*  MG  --  2.4  --   --   --    GFR: Estimated Creatinine Clearance: 54.9 mL/min (A) (by C-G formula based on SCr of 1.41 mg/dL (H)). Liver Function Tests: Recent Labs  Lab 01/26/24 1936 01/27/24 2043  AST 24 28  ALT 24 22  ALKPHOS 78 58  BILITOT 0.8 1.2  PROT 7.1 6.9  ALBUMIN 3.8 3.2*   Recent Labs  Lab 01/31/24 0956  AMMONIA 19   Coagulation Profile: Recent Labs  Lab 01/27/24 2043  INR 1.5*   BNP (last 3 results) Recent Labs    09/11/23 1627 10/28/23 1107 01/27/24 0844  BNP 3,874.2* 754.7* 4,043.5*   CBG: Recent Labs  Lab 02/01/24 0741 02/01/24 1136 02/01/24 1553 02/01/24 2226 02/02/24 0827  GLUCAP 153* 176* 171* 149* 166*   Sepsis Labs: Recent Labs  Lab 01/27/24 1826 01/27/24 2043 01/27/24 2207  PROCALCITON  --  7.52  --   LATICACIDVEN 2.3* 1.5 1.4    Recent Results (from the past 240 hours)  Culture, blood (  x 2)     Status: Abnormal   Collection Time: 01/27/24  8:43 PM   Specimen: BLOOD  Result Value Ref Range Status   Specimen Description   Final    BLOOD BLOOD LEFT ARM LAC Performed at [facility], [address], [place], [place] [address]    Special Requests   Final    BOTTLES DRAWN AEROBIC AND ANAEROBIC Blood Culture adequate volume Performed at  [facility], [address] [place], [place] [address]    Culture  Setup Time   Final    GRAM POSITIVE COCCI IN BOTH AEROBIC AND ANAEROBIC BOTTLES CRITICAL RESULT CALLED TO, READ BACK BY AND VERIFIED WITH: TREY GREENWOOD 01/28/24 0811 MW GRAM STAIN REVIEWED-AGREE WITH RESULT DRT    Culture (A)  Final    GROUP B STREP(S.AGALACTIAE)ISOLATED SUSCEPTIBILITIES PERFORMED ON PREVIOUS CULTURE WITHIN THE LAST 5 DAYS. Performed at [facility], [address] [place], [place] [address]    Report Status 01/30/2024 FINAL  Final  Culture, blood (x 2)     Status: Abnormal   Collection Time: 01/27/24  8:49 PM   Specimen: BLOOD  Result Value Ref Range Status   Specimen Description   Final    BLOOD BLOOD LEFT HAND [facility] Performed at [facility], [address], [place], [place] [address]    Special Requests   Final    BOTTLES DRAWN AEROBIC AND ANAEROBIC Blood Culture adequate volume Performed at [facility], [address] [place], [place] [address]    Culture  Setup Time   Final    GRAM POSITIVE COCCI IN BOTH AEROBIC AND ANAEROBIC BOTTLES Organism ID to follow CRITICAL RESULT CALLED TO, READ BACK BY AND VERIFIED WITH: Kathyanne Parkers GREENWOOD 01/28/24 0811 MW GRAM STAIN REVIEWED-AGREE WITH RESULT DRT Performed at [facility], [address] [place], [place] [address]    Culture GROUP B STREP(S.AGALACTIAE)ISOLATED (A)  Final   Report Status 01/30/2024 FINAL  Final   Organism ID, Bacteria GROUP B STREP(S.AGALACTIAE)ISOLATED  Final      Susceptibility   Group b strep(s.agalactiae)isolated - MIC*    CLINDAMYCIN RESISTANT Resistant     AMPICILLIN <=0.25 SENSITIVE Sensitive     ERYTHROMYCIN >=8 RESISTANT Resistant     VANCOMYCIN  0.5 SENSITIVE Sensitive     CEFTRIAXONE  <=0.12 SENSITIVE Sensitive     LEVOFLOXACIN  0.5 SENSITIVE Sensitive     PENICILLIN <=0.06 SENSITIVE Sensitive     * GROUP B STREP(S.AGALACTIAE)ISOLATED  Blood Culture ID Panel (Reflexed)      Status: Abnormal   Collection Time: 01/27/24  8:49 PM  Result Value Ref Range Status   Enterococcus faecalis NOT DETECTED NOT DETECTED Final   Enterococcus Faecium NOT DETECTED NOT DETECTED Final   Listeria monocytogenes NOT DETECTED NOT DETECTED Final   Staphylococcus species NOT DETECTED NOT DETECTED Final   Staphylococcus aureus (BCID) NOT DETECTED NOT DETECTED Final   Staphylococcus epidermidis NOT DETECTED NOT DETECTED Final   Staphylococcus lugdunensis NOT DETECTED NOT DETECTED Final   Streptococcus species DETECTED (A) NOT DETECTED Final    Comment: CRITICAL RESULT CALLED TO, READ BACK BY AND VERIFIED WITH: TREY GREENWOOD 01/28/24 0811 MW    Streptococcus agalactiae DETECTED (A) NOT DETECTED Final    Comment: CRITICAL RESULT CALLED TO, READ BACK BY AND VERIFIED WITH: TREY GREENWOOD 01/28/24 0811 MW    Streptococcus pneumoniae NOT DETECTED NOT DETECTED Final   Streptococcus pyogenes NOT DETECTED NOT DETECTED Final   A.calcoaceticus-baumannii NOT DETECTED NOT DETECTED Final  Bacteroides fragilis NOT DETECTED NOT DETECTED Final   Enterobacterales NOT DETECTED NOT DETECTED Final   Enterobacter cloacae complex NOT DETECTED NOT DETECTED Final   Escherichia coli NOT DETECTED NOT DETECTED Final   Klebsiella aerogenes NOT DETECTED NOT DETECTED Final   Klebsiella oxytoca NOT DETECTED NOT DETECTED Final   Klebsiella pneumoniae NOT DETECTED NOT DETECTED Final   Proteus species NOT DETECTED NOT DETECTED Final   Salmonella species NOT DETECTED NOT DETECTED Final   Serratia marcescens NOT DETECTED NOT DETECTED Final   Haemophilus influenzae NOT DETECTED NOT DETECTED Final   Neisseria meningitidis NOT DETECTED NOT DETECTED Final   Pseudomonas aeruginosa NOT DETECTED NOT DETECTED Final   Stenotrophomonas maltophilia NOT DETECTED NOT DETECTED Final   Candida albicans NOT DETECTED NOT DETECTED Final   Candida auris NOT DETECTED NOT DETECTED Final   Candida glabrata NOT DETECTED NOT DETECTED  Final   Candida krusei NOT DETECTED NOT DETECTED Final   Candida parapsilosis NOT DETECTED NOT DETECTED Final   Candida tropicalis NOT DETECTED NOT DETECTED Final   Cryptococcus neoformans/gattii NOT DETECTED NOT DETECTED Final    Comment: Performed at [facility], [address] [place], [place] [address]  MRSA Next Gen by PCR, Nasal     Status: None   Collection Time: 01/28/24  1:00 AM   Specimen: Nasal Mucosa; Nasal Swab  Result Value Ref Range Status   MRSA by PCR Next Gen NOT DETECTED NOT DETECTED Final    Comment: (NOTE) The GeneXpert MRSA Assay (FDA approved for NASAL specimens only), is one component of a comprehensive MRSA colonization surveillance program. It is not intended to diagnose MRSA infection nor to guide or monitor treatment for MRSA infections. Test performance is not FDA approved in patients less than 50 years old. Performed at [facility], [address] [place], [place] [address]   Aerobic Culture w Gram Stain (superficial specimen)     Status: None   Collection Time: 01/29/24  6:35 AM   Specimen: Foot; Wound  Result Value Ref Range Status   Specimen Description   Final    FOOT RIGHT Performed at [facility], [address], [place], [place] [address]    Special Requests   Final    NONE Performed at [facility], [address] [place], [place] [address]    Gram Stain NO WBC SEEN RARE GRAM POSITIVE COCCI IN PAIRS   Final   Culture   Final    RARE STAPHYLOCOCCUS AUREUS SUSCEPTIBILITIES PERFORMED ON PREVIOUS CULTURE WITHIN THE LAST 5 DAYS. ABUNDANT CORYNEBACTERIUM STRIATUM Standardized susceptibility testing for this organism is not available. Performed at [facility], [address] [place], [place] [address]    Report Status 02/01/2024 FINAL  Final  Aerobic Culture w Gram Stain (superficial specimen)     Status: None   Collection Time: 01/29/24  6:37 AM   Specimen: Foot; Wound  Result Value Ref  Range Status   Specimen Description   Final    FOOT LEFT Performed at [facility], [address] [place], [place] [address]    Special Requests   Final    NONE Performed at [facility], [address] [place], [place] [address]    Gram Stain   Final    NO WBC SEEN RARE GRAM POSITIVE COCCI IN PAIRS Performed at [facility], [address], [place], [place] [address]    Culture   Final    RARE METHICILLIN RESISTANT  STAPHYLOCOCCUS AUREUS RARE ENTEROCOCCUS FAECALIS RARE GROUP B STREP(S.AGALACTIAE)ISOLATED TESTING AGAINST S. AGALACTIAE NOT ROUTINELY PERFORMED DUE TO PREDICTABILITY OF AMP/PEN/VAN SUSCEPTIBILITY. RARE FUNGUS (MOLD) ISOLATED, PROBABLE CONTAMINANT/COLONIZER (SAPROPHYTE). CONTACT MICROBIOLOGY IF FURTHER IDENTIFICATION REQUIRED [phone].    Report Status 02/01/2024 FINAL  Final   Organism ID, Bacteria METHICILLIN RESISTANT STAPHYLOCOCCUS AUREUS  Final   Organism ID, Bacteria ENTEROCOCCUS FAECALIS  Final      Susceptibility   Enterococcus faecalis - MIC*    AMPICILLIN <=2 SENSITIVE Sensitive     VANCOMYCIN  1 SENSITIVE Sensitive     GENTAMICIN  SYNERGY SENSITIVE Sensitive     * RARE ENTEROCOCCUS FAECALIS   Methicillin resistant staphylococcus aureus - MIC*    CIPROFLOXACIN  >=8 RESISTANT Resistant     ERYTHROMYCIN >=8 RESISTANT Resistant     GENTAMICIN  <=0.5 SENSITIVE Sensitive     OXACILLIN >=4 RESISTANT Resistant     TETRACYCLINE <=1 SENSITIVE Sensitive     VANCOMYCIN  1 SENSITIVE Sensitive     TRIMETH /SULFA  <=10 SENSITIVE Sensitive     CLINDAMYCIN <=0.25 SENSITIVE Sensitive     RIFAMPIN <=0.5 SENSITIVE Sensitive     Inducible Clindamycin NEGATIVE Sensitive     LINEZOLID 2 SENSITIVE Sensitive     * RARE METHICILLIN RESISTANT STAPHYLOCOCCUS AUREUS  Culture, blood (Routine X 2) w Reflex to ID Panel     Status: None (Preliminary result)   Collection Time: 01/29/24  6:11 PM   Specimen: BLOOD  Result Value Ref Range Status   Specimen  Description BLOOD BLOOD RIGHT HAND  Final   Special Requests   Final    BOTTLES DRAWN AEROBIC AND ANAEROBIC Blood Culture adequate volume   Culture   Final    NO GROWTH 4 DAYS Performed at [facility], [address] [place], [place] [address]    Report Status PENDING  Incomplete  Culture, blood (Routine X 2) w Reflex to ID Panel     Status: None (Preliminary result)   Collection Time: 01/29/24  7:42 PM   Specimen: BLOOD  Result Value Ref Range Status   Specimen Description BLOOD BLOOD RIGHT HAND  Final   Special Requests   Final    BOTTLES DRAWN AEROBIC AND ANAEROBIC Blood Culture adequate volume   Culture   Final    NO GROWTH 4 DAYS Performed at [facility], [address] [place], [place] [address]    Report Status PENDING  Incomplete  Body fluid culture w Gram Stain     Status: None (Preliminary result)   Collection Time: 01/30/24  4:00 PM   Specimen: Synovium; Body Fluid  Result Value Ref Range Status   Specimen Description   Final    SYNOVIAL Performed at [facility], [address], [place], [place] [address]    Special Requests   Final    L KNEE Performed at [facility], [address] [place], [place] [address]    Gram Stain   Final    MODERATE WBC PRESENT, PREDOMINANTLY PMN NO ORGANISMS SEEN    Culture   Final    NO GROWTH 2 DAYS Performed at [facility], [address], [place], [place] [address]    Report Status PENDING  Incomplete    Scheduled Meds:  vitamin C   500 mg Oral BID   aspirin   81 mg Oral Daily   atorvastatin   80 mg Oral q1800   bumetanide   2 mg Oral Daily   Chlorhexidine  Gluconate Cloth  6 each Topical Daily   feeding supplement (  NEPRO CARB STEADY)  237 mL Oral BID BM   gentamicin  cream  1 Application Topical BID   insulin  aspart  0-15 Units Subcutaneous TID WC   insulin  aspart  0-5 Units Subcutaneous QHS   multivitamin with minerals  1 tablet Oral Daily   silver  sulfADIAZINE    Topical  Daily   Continuous Infusions:  heparin  2,300 Units/hr (02/02/24 0701)   vancomycin        LOS: 5 days   Time spent: 45 minutes  Unk Garb, DO  [facility]  02/02/2024, 9:19 AM

## 2024-02-02 NOTE — Progress Notes (Signed)
 Physical Therapy Treatment Patient Details Name: Tyler [name] MRN: [id number] DOB: [birth year] Today's Date: 02/02/2024   History of Present Illness Pt is a 78 year old male Admitted with A/C HFrEF, sepsis and NSTEMI.     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    PT Comments  Pt more engaged today.  Answering appropriately and asking for water .  He is assisted with BLE AAROM with LLE being more painful than RLE.  He does help as able but does seem to fall asleep at times without verbal cues to stay aware.  He is assisted with thickened OJ per his request and finishes container with spoon.  Assisted to reposition more to neutral position.  While he does assist, he needs significant assist to move back to midline.  Pt will need +2 for attempts at sitting/bed mobility at this time.  Would be appropriate for co-tx next session to max functional mobility.   If plan is discharge home, recommend the following: Two people to help with walking and/or transfers;Assist for transportation;Assistance with cooking/housework;Help with stairs or ramp for entrance;Two people to help with bathing/dressing/bathroom   Can travel by private vehicle        Equipment Recommendations       Recommendations for Other Services       Precautions / Restrictions Precautions Precautions: Fall Recall of Precautions/Restrictions: Intact Restrictions Weight Bearing Restrictions Per Provider Order:  (see below) Other Position/Activity Restrictions: per podiatry note on 4/29 "Patient brought his compression socks today.  No multilayer Unna boot compression wrap was applied today.  Wear compression socks daily  -Continue to  advise against going barefoot.  Patient currently wearing good supportive tennis shoes and sneakers.  Continue"     Mobility  Bed Mobility Overal bed mobility: Needs Assistance             General bed mobility comments: max a ro reposition in bed.  does try to assist but needs significant help    Transfers                        Ambulation/Gait                   Stairs             Wheelchair Mobility     Tilt Bed    Modified Rankin (Stroke Patients Only)       Balance                                            Communication Communication Communication: No apparent difficulties  Cognition Arousal: Alert Behavior During Therapy: WFL for tasks assessed/performed, Flat affect                             Following commands: Intact      Cueing Cueing Techniques: Verbal cues  Exercises Other Exercises Other Exercises: BLEAAROM for supine ranges    General Comments        Pertinent Vitals/Pain Pain Assessment Pain Assessment: Faces Faces Pain Scale: Hurts even more Pain Location:  mostly L knee with ROM Pain Descriptors / Indicators: Grimacing, Discomfort Pain Intervention(s): Monitored during session, Repositioned    Home Living                          Prior Function            PT Goals (current goals can now be found in the care plan section) Progress towards PT goals: Not progressing toward goals - comment    Frequency    Min 3X/week      PT Plan      Co-evaluation              AM-PAC PT "6 Clicks" Mobility   Outcome Measure  Help needed turning from your back to your side while in a flat bed without using bedrails?: A Lot Help needed moving from lying on your back to sitting on the side of a flat bed without using bedrails?: Total Help needed moving to and from a bed to a chair (including a wheelchair)?: Total Help needed standing up from a chair using your  arms (e.g., wheelchair or bedside chair)?: Total Help needed to walk in hospital room?: Total Help needed climbing 3-5 steps with a railing? : Total 6 Click Score: 7    End of Session Equipment Utilized During Treatment: Gait belt Activity Tolerance: Patient limited by fatigue Patient left: in bed;with call bell/phone within reach;with nursing/sitter in room Nurse Communication: Mobility status PT Visit Diagnosis: Unsteadiness on feet (R26.81);Muscle weakness (generalized) (M62.81);Difficulty in walking, not elsewhere classified (R26.2)     Time: 5409-8119 PT Time Calculation (min) (ACUTE ONLY): 10 min  Charges:    $Therapeutic Activity: 8-22 mins PT General Charges $$ ACUTE PT VISIT: 1 Visit                   Charlanne Cong, PTA 02/02/24, 8:58 AM

## 2024-02-02 NOTE — Progress Notes (Signed)
 Daily Progress Note   Patient Name: Tyler [name]       Date: 02/02/2024 DOB: 12-Aug-1946  Age: 78 y.o. MRN#: [id number] Attending Physician: Unk Garb, DO Primary Care Physician: McClanahan, Kyra, NP Admit Date: 01/26/2024  Reason for Consultation/Follow-up: Establishing goals of care  HPI/Brief Hospital Review:  77 y.o. male  with past medical history of HFpEF, A-fib/flutter, AV block s/p PPM (2021), CAD, CKD stage III, hypertension and hyperlipidemia admitted from home on 01/26/2024 with dizziness, lightheadedness and confusion.  Also reported dull chest pain.   EKG on arrival showed A-fib with RVR, PVCs and L BBB Elevated troponin and significantly elevated BNP Chest x-ray with cardiomegaly and congestion   During this admission found to have positive blood cultures growing group B streptococcus, ID consulted Concern for endocarditis and or pacing wire vegetation as well as concern for left knee septic arthritis S/p left knee aspiration   Advanced heart failure team also following TEE on hold due to AKI as well as AMS   Palliative medicine was consulted for assisting with goals of care conversations  Subjective: Extensive chart review has been completed prior to meeting patient including labs, vital signs, imaging, progress notes, orders, and available advanced directive documents from current and previous encounters.    Visited with [name] at his bedside.  He is resting in bed with eyes closed but does acknowledge my presence to calling of his name.  He never opens his eyes during our visit or conversation.  He is able to state that he is in the hospital, reports correct date and aware that he was brought to the hospital because he was feeling dizzy.  Easily drifts back off to  sleep without constant redirection.  Clearly has moments of confusion when attempting to converse as he makes statements that are not related to current conversation.  Complained of neck pain which was relieved by repositioning in the bed.  Later visited at bedside with daughter [name] as well as her family.  [name] expresses her concerns to [name] current mental status.  She reports a significant change since earlier in the week.  Spoke with the nursing staff who shared mentation seems to be the same as it was earlier today when primary team rounded.  They are to visit from Friday it seems [name]  mentation is about the same.  [name] shares she received updates from primary team earlier today.  Aware antibiotics have been adjusted to treat both wound and bacteremia infection.  [name] is aware [name] has been made n.p.o. past midnight for possible TEE tomorrow.  [name] is planning to visit again tomorrow and is hopeful to get updates and have conversations from cardiology team.  Again attempted to elicit goals of care.  [name] shares prior to visiting today she went by her father's home looking for advance directive paperwork but was unable to locate any.  She again discusses her sister Carmel-shares that she does not have much of a relationship with her father.  [name] aware Carmel did visit over the weekend but prior to this had not talked to her father in over a month.  [name] is hopeful that she and her sister can have conversations regarding [name] current condition and goals of care.  Recommended to [name] that she and her sister consider CODE STATUS with recommendations for DO NOT RESUSCITATE.  [name] again shares she works in healthcare and is aware of the potential outcomes of resuscitation and will attempt to have these conversations with her sister.  [name] aware PMT provider available to assist with goals of care conversations between the sisters.  We  discussed [name] guarded prognosis at this time with concern for decompensation.  Answered and addressed all questions and concerns.  PMT to continue to follow close for ongoing needs and support and continuing goals of care conversation.  Objective:  Physical Exam Constitutional:      General: He is not in acute distress.    Appearance: He is ill-appearing.  Pulmonary:     Effort: Pulmonary effort is normal.  Skin:    General: Skin is warm and dry.  Neurological:     Mental Status: He is lethargic.     Motor: Weakness present.     Comments: Intermittent confusion             Vital Signs: BP (!) 107/53 (BP Location: Right Arm)   Pulse 87   Temp 98.3 F (36.8 C)   Resp 18   Ht 6\' 6"  (1.981 m)   Wt 100.7 kg   SpO2 99%   BMI 25.66 kg/m  SpO2: SpO2: 99 % O2 Device: O2 Device: Nasal Cannula O2 Flow Rate: O2 Flow Rate (L/min): 1 L/min   Palliative Care Assessment & Plan   Assessment/Recommendation/Plan  Ongoing goals of care conversations needed PMT to continue to follow  Thank you for allowing the Palliative Medicine Team to assist in the care of this patient.  Total time:  50 minutes  Time spent includes: Detailed review of medical records (labs, imaging, vital signs), medically appropriate exam (mental status, respiratory, cardiac, skin), discussed with treatment team, counseling and educating patient, family and staff, documenting clinical information, medication management and coordination of care.  Isadore Marble, DNP, AGNP-C Palliative Medicine   Please contact Palliative Medicine Team phone at [phone] for questions and concerns.

## 2024-02-02 NOTE — Plan of Care (Signed)
   Problem: Education: Goal: Ability to describe self-care measures that may prevent or decrease complications (Diabetes Survival Skills Education) will improve Outcome: Progressing   Problem: Coping: Goal: Ability to adjust to condition or change in health will improve Outcome: Progressing   Problem: Fluid Volume: Goal: Ability to maintain a balanced intake and output will improve Outcome: Progressing

## 2024-02-03 ENCOUNTER — Encounter: Admitting: Family

## 2024-02-03 DIAGNOSIS — M25462 Effusion, left knee: Secondary | ICD-10-CM | POA: Diagnosis not present

## 2024-02-03 DIAGNOSIS — J9611 Chronic respiratory failure with hypoxia: Secondary | ICD-10-CM | POA: Diagnosis not present

## 2024-02-03 DIAGNOSIS — I5023 Acute on chronic systolic (congestive) heart failure: Secondary | ICD-10-CM | POA: Diagnosis not present

## 2024-02-03 DIAGNOSIS — I214 Non-ST elevation (NSTEMI) myocardial infarction: Secondary | ICD-10-CM | POA: Diagnosis not present

## 2024-02-03 DIAGNOSIS — N179 Acute kidney failure, unspecified: Secondary | ICD-10-CM | POA: Diagnosis not present

## 2024-02-03 DIAGNOSIS — R7881 Bacteremia: Secondary | ICD-10-CM | POA: Diagnosis not present

## 2024-02-03 DIAGNOSIS — B951 Streptococcus, group B, as the cause of diseases classified elsewhere: Secondary | ICD-10-CM | POA: Diagnosis not present

## 2024-02-03 DIAGNOSIS — A401 Sepsis due to streptococcus, group B: Secondary | ICD-10-CM | POA: Diagnosis not present

## 2024-02-03 LAB — BASIC METABOLIC PANEL WITH GFR
Anion gap: 12 (ref 5–15)
BUN: 67 mg/dL — ABNORMAL HIGH (ref 8–23)
CO2: 21 mmol/L — ABNORMAL LOW (ref 22–32)
Calcium: 8.9 mg/dL (ref 8.9–10.3)
Chloride: 106 mmol/L (ref 98–111)
Creatinine, Ser: 1.46 mg/dL — ABNORMAL HIGH (ref 0.61–1.24)
GFR, Estimated: 49 mL/min — ABNORMAL LOW (ref >60)
Glucose, Bld: 189 mg/dL — ABNORMAL HIGH (ref 70–99)
Potassium: 3.8 mmol/L (ref 3.5–5.1)
Sodium: 139 mmol/L (ref 135–145)

## 2024-02-03 LAB — HEPARIN LEVEL (UNFRACTIONATED): Heparin Unfractionated: 0.35 [IU]/mL (ref 0.30–0.70)

## 2024-02-03 LAB — CULTURE, BLOOD (ROUTINE X 2)
Culture: NO GROWTH
Culture: NO GROWTH
Special Requests: ADEQUATE
Special Requests: ADEQUATE

## 2024-02-03 LAB — GLUCOSE, CAPILLARY
Glucose-Capillary: 194 mg/dL — ABNORMAL HIGH (ref 70–99)
Glucose-Capillary: 220 mg/dL — ABNORMAL HIGH (ref 70–99)
Glucose-Capillary: 213 mg/dL — ABNORMAL HIGH (ref 70–99)
Glucose-Capillary: 216 mg/dL — ABNORMAL HIGH (ref 70–99)

## 2024-02-03 LAB — CBC
HCT: 35.9 % — ABNORMAL LOW (ref 39.0–52.0)
Hemoglobin: 11.2 g/dL — ABNORMAL LOW (ref 13.0–17.0)
MCH: 27.1 pg (ref 26.0–34.0)
MCHC: 31.2 g/dL (ref 30.0–36.0)
MCV: 86.9 fL (ref 80.0–100.0)
Platelets: 234 10*3/uL (ref 150–400)
RBC: 4.13 MIL/uL — ABNORMAL LOW (ref 4.22–5.81)
RDW: 16.2 % — ABNORMAL HIGH (ref 11.5–15.5)
WBC: 11.2 10*3/uL — ABNORMAL HIGH (ref 4.0–10.5)
nRBC: 0 % (ref 0.0–0.2)

## 2024-02-03 LAB — MAGNESIUM: Magnesium: 2.5 mg/dL — ABNORMAL HIGH (ref 1.7–2.4)

## 2024-02-03 MED ORDER — SODIUM CHLORIDE 0.9 % IV SOLN
2.0000 g | INTRAVENOUS | Status: DC
  Administered 2024-02-04 – 2024-02-05 (×2): 2 g via INTRAVENOUS
  Filled 2024-02-03 (×3): qty 20

## 2024-02-03 NOTE — TOC Progression Note (Addendum)
 Transition of Care [facility]) - Progression Note    Patient Details  Name: Tyler [name] MRN: [id number] Date of Birth: 06/14/1946  [facility]) CM/SW Contact  Tyler Bennett, LCSW Phone Number: 02/03/2024, 12:28 PM  Clinical Narrative:  CSW met with daughter Tyler [name] at bedside. She had patient's other daughter, Tyler [name], on speaker phone. Discussed SNF recommendation. Patient is still unable to walk. Daughters would like for him to go to SNF. Tyler [name] works for an ALF and will follow up on SNF preferences. Patient was primarily asleep during interaction. He woke up briefly to greet CSW but quickly fell back asleep.   1:36 pm: Received call from daughter, Tyler [name]. Top 3 preferences are [address] [facility] [place], [facility] and [facility] CSW left voicemail for admissions coordinator at [facility] [place]. [facility] is currently at capacity. [facility] has offered a bed.  2:13 pm: Artist will contact Tyler [name] about a Physicist, medical.  Expected Discharge Plan: Home/Self Care Barriers to Discharge: Continued Medical Work up  Expected Discharge Plan and Services     Post Acute Care Choice: NA Living arrangements for the past 2 months: Single Family Home                                       Social Determinants of Health (SDOH) Interventions SDOH Screenings   Food Insecurity: No Food Insecurity (01/27/2024)  Housing: Low Risk  (01/27/2024)  Transportation Needs: No Transportation Needs (01/27/2024)  Utilities: Not At Risk (01/27/2024)  Alcohol Screen: Low Risk  (08/14/2023)  Financial Resource Strain: Low Risk  (03/20/2023)   Received from [facility]  Social Connections: Socially Isolated (01/27/2024)  Stress: No Stress Concern Present (09/07/2020)   Received from [facility]  Tobacco Use: Low Risk  (01/26/2024)    Readmission Risk Interventions    01/29/2024   12:58 PM 09/12/2023    12:13 PM 08/15/2023    3:57 PM  Readmission Risk Prevention Plan  Transportation Screening Complete Complete Complete  PCP or Specialist Appt within 3-5 Days  Complete Complete  HRI or Home Care Consult  Complete Complete  Social Work Consult for Recovery Care Planning/Counseling  Complete Complete  Palliative Care Screening  Not Applicable Not Applicable  Medication Review Oceanographer) Complete Complete Complete  PCP or Specialist appointment within 3-5 days of discharge Complete    SW Recovery Care/Counseling Consult Complete    Palliative Care Screening Not Applicable    Skilled Nursing Facility Not Applicable

## 2024-02-03 NOTE — Progress Notes (Addendum)
 Advanced Heart Failure Rounding Note  Cardiologist: Janette Medley, MD   Chief Complaint: Heart failure, NSTEMI  Subjective:    Mental status waxes and wanes. Able to participate with PT today. Very weak.    Objective:   Weight Range: 100.7 kg Body mass index is 25.66 kg/m.   Vital Signs:   Temp:  [98 F (36.7 C)-98.9 F (37.2 C)] 98 F (36.7 C) (05/12 0758) Pulse Rate:  [64-88] 80 (05/12 0758) Resp:  [9-29] 18 (05/12 0758) BP: (107-122)/(53-69) 117/69 (05/12 0758) SpO2:  [98 %-99 %] 99 % (05/12 0758) Last BM Date : 02/02/24  Weight change: Filed Weights   01/30/24 0518 01/31/24 0508 02/01/24 0500  Weight: 107.3 kg 103 kg 100.7 kg    Intake/Output:   Intake/Output Summary (Last 24 hours) at 02/03/2024 1013 Last data filed at 02/03/2024 0905 Gross per 24 hour  Intake --  Output 2850 ml  Net -2850 ml    Physical Exam   General:  Chronically ill appearing, lethargic Neck: no JVD.  AVW:UJWJXBJ rate & rhythm. No rubs, gallops or murmurs. Lungs: clear Abdomen: soft, nontender, nondistended.  Extremities: trace edema, dark discoloration to anterior shins, dressings wrapped over both feet, multiple toe amputations Neuro: Awake. Intermittently responds/answers questions    Telemetry   V paced 70s with underlying Afib, PVCs  Labs    CBC Recent Labs    02/02/24 0400 02/03/24 0351  WBC 9.4 11.2*  HGB 11.1* 11.2*  HCT 35.4* 35.9*  MCV 86.3 86.9  PLT 216 234   Basic Metabolic Panel Recent Labs    47/82/95 0400 02/03/24 0351  NA 139 139  K 3.8 3.8  CL 104 106  CO2 21* 21*  GLUCOSE 171* 189*  BUN 74* 67*  CREATININE 1.41* 1.46*  CALCIUM  8.8* 8.9  MG  --  2.5*   Liver Function Tests No results for input(s): "AST", "ALT", "ALKPHOS", "BILITOT", "PROT", "ALBUMIN" in the last 72 hours.  No results for input(s): "LIPASE", "AMYLASE" in the last 72 hours. Cardiac Enzymes No results for input(s): "CKTOTAL", "CKMB", "CKMBINDEX", "TROPONINI" in  the last 72 hours.  BNP: BNP (last 3 results) Recent Labs    09/11/23 1627 10/28/23 1107 01/27/24 0844  BNP 3,874.2* 754.7* 4,043.5*    ProBNP (last 3 results) Recent Labs    10/02/23 1111  PROBNP 3,757*     D-Dimer No results for input(s): "DDIMER" in the last 72 hours. Hemoglobin A1C No results for input(s): "HGBA1C" in the last 72 hours.  Fasting Lipid Panel No results for input(s): "CHOL", "HDL", "LDLCALC", "TRIG", "CHOLHDL", "LDLDIRECT" in the last 72 hours. Thyroid Function Tests No results for input(s): "TSH", "T4TOTAL", "T3FREE", "THYROIDAB" in the last 72 hours.  Invalid input(s): "FREET3"  Other results:   Imaging    No results found.    Medications:   Scheduled Medications:  vitamin C   500 mg Oral BID   aspirin   81 mg Oral Daily   atorvastatin   80 mg Oral q1800   bumetanide   2 mg Oral Daily   Chlorhexidine  Gluconate Cloth  6 each Topical Daily   feeding supplement (NEPRO CARB STEADY)  237 mL Oral BID BM   gentamicin  cream  1 Application Topical BID   insulin  aspart  0-15 Units Subcutaneous TID WC   insulin  aspart  0-5 Units Subcutaneous QHS   multivitamin with minerals  1 tablet Oral Daily   silver  sulfADIAZINE    Topical Daily    Infusions:  heparin  2,300 Units/hr (02/03/24  1610)   vancomycin  1,750 mg (02/02/24 1705)    PRN Medications: acetaminophen  **OR** acetaminophen , magnesium  hydroxide, melatonin, nitroGLYCERIN , ondansetron  **OR** ondansetron  (ZOFRAN ) IV  Patient Profile   Tyler [name] is a 78 y/o male with a history of persistent atrial fibrillation 06/21 (not on anticoag), HTN, T2DM, CKD, cellulitis, hyperlipidemia, 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 Admitted with A/C HFrEF, sepsis and NSTEMI.   Assessment/Plan   Acute on chronic HFrEF - Echo  12/21 (at time of pacer ) EF 55-60% - Echo 07/03/23: EF 30-35% with moderate Tyler - Echo 01/28/24: EF 25-30%, RV moderately reduced, RVSP 74 mmhg, moderate to severe Tyler - NYHA IV on admission - Suspect drop in EF may be related to chronic RV pacing (less likely progression of CAD) - Massively volume overloaded after resuscitation for sepsis. Diuresed well. Now appears euvolemic on 2 mg bumex  daily.  - GDMT on hold with AKI, profound weakness/acute illness - Ideally would have cath followed by device upgrade to CRT but not candidate at this point   NSTEMI - Denies new chest pain. Admitted with persistent 5/10 chest pain, has had it for years. - HsTrop peaked at 1800. Suspect demand ischemia - No s/s angina - On ASA, plavix  stoped - Not candidate for cath at this time   Sepsis  - UA with UTI (chronic foley) + wounds - Foot wound cultures grew enterococcus, group B strep and MRSA.  - Blood cultures grew group B strep agalactiae - L knee aspiration 05/08, NGTD on culture - Pending TEE to r/o vegetation/endocarditis. Currently not candidate for device extraction/upgrade (EP has seen). Suspect PPM leads are likely seeded. - Continues on IV vancomycin    Persistent atrial fibrillation - Rate controlled.  - Continue heparin  gtt, has refused anticoagulation in the past   AV block s/p MDT PPM  - Has not been compliant with EP follow ups.  - DIscussion as above. 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 Not candidate currently for CRT upgrade   AKI on CKD IIIa due to ATN - Baseline SCr ~1.2-1.5 - SCr peaked 2.4, down to 1.46 today  7. NSVT - Keep K > 4.0 Mg > 2.0 - Can use amio as needed  8. GOC -Palliative Care consulted. Appreciate input. -Currently full code. Long-term prognosis likely poor. Family considering DNR     Length of Stay: 6  Tyler [name], Tyler Barber, PA-C  02/03/2024, 10:13 AM  Advanced Heart Failure Team Pager [phone] (M-F; 7a - 5p)  Please contact [facility] for night-coverage after  hours (5p -7a ) and weekends on [email or web address]

## 2024-02-03 NOTE — Plan of Care (Signed)
   Problem: Education: Goal: Ability to describe self-care measures that may prevent or decrease complications (Diabetes Survival Skills Education) will improve Outcome: Progressing   Problem: Coping: Goal: Ability to adjust to condition or change in health will improve Outcome: Progressing   Problem: Fluid Volume: Goal: Ability to maintain a balanced intake and output will improve Outcome: Progressing

## 2024-02-03 NOTE — Progress Notes (Signed)
 [facility] CLINIC CARDIOLOGY PROGRESS NOTE       Patient ID: OCTAVION LEAP MRN: [id number] DOB/AGE: 30-Mar-1946 78 y.o.  Admit date: 01/26/2024 Referring Physician Dr. Amalia Jung Primary Physician Jewell Mose, NP  Primary Cardiologist Dr. Bob Burn Reason for Consultation chest pain  HPI: Tyler [name] is a 78 y.o. male  with a past medical history of chronic HFrEF, persistent atrial fibrillation, mitral insufficiency, CHB s/p PPM 2021, hypertension, chronic kidney disease stage III who presented to the ED on 01/26/2024 for chest pain. Cardiology was consulted for further evaluation.   Interval history: -Patient seen and examined this morning. Still somewhat confused. Daughter at bedside. -Cr stable today.  -Remains on heparin  gtt.  -Blood cx positive with strep agalactiae, repeat NGTD. ID recommending TEE.  -Went back to discuss TEE with both daughters. [name] had questions about care thus far and indications for TEE.   Review of systems complete and found to be negative unless listed above    Past Medical History:  Diagnosis Date   (HFpEF) heart failure with preserved ejection fraction (HCC) 03/01/2020   a.) TTE 03/01/2020: EF 55-60%, mod MAC, mod AoV sclerosis, triv AR, mild TR, mod MR, RVSP 50-59; b.) TTE 09/10/2020: EF 55-60%, mod MAC, mod AoV sclerosis, mild TR, 3+ MR, RVSP 50-59; c.) TTE 09/26/2020: EF 55-60%, mild LA dil, triv PR, mild MR/TR, RVSP 37-49   Adenoma of left adrenal gland    Anemia    Arthritis    Atrial fibrillation and flutter (HCC)    a.) CHA2DS2VASc = 5 (age x2, HFpEF, HTN, T2DM);  b.) s/p CTI ablation 09/07/2020; c.) rate/rhythm maintained on oral carvedilol ; not on chronic anticoagulation therapy   CAD (coronary artery disease)    Cardiomegaly    CKD (chronic kidney disease), stage III (HCC)    DOE (dyspnea on exertion)    Drug-induced bradycardia    Gangrene of toe of left foot (HCC)    a.) 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   H/O active  rheumatic fever 08/22/2020   Hepatosplenomegaly    History of bilateral cataract extraction    HLD (hyperlipidemia)    Hypertension    Long term current use of aspirin     Lymphedema of both lower extremities    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)    a.) s/p amputation 11/04/2022   Peripheral vascular disease (HCC)    Pleural effusion on right 09/09/2020   a.) s/p RIGHT thoracentesis with 2180 cc yield   Pneumonia    Presence of permanent cardiac pacemaker 09/10/2020   a.) TVP placement 09/10/2020 due to intermittent CHB in setting of urosepsis; b.) s/p PPM placement 09/15/2020: MDT Azure XT DR (SN: [id number])   Pulmonary hypertension (HCC) 03/01/2020   a.) TTE: 03/01/2020: RVSP 50-59; b.) TTE 09/10/2020: RVSP 50-59; c.) TTE 09/26/2020: RVSP 37-49   RA (rheumatoid arthritis) (HCC)    Rheumatic fever    Sepsis (HCC) 09/10/2020   a.) urosepsis --> BC x 2 sets and UC all grew out significant Proteus mirabilis; admitted to [facility] 09/07/2020 - 09/27/2020.   Sick sinus syndrome [facility])    a.) s/p MDT PPM placement 09/15/2020   T2DM (type 2 diabetes mellitus) (HCC)    Urinary retention    chronic, with indwelling Foley catheter and plans for a suprapubic   Wears dentures    full upper    Past Surgical History:  Procedure Laterality Date   AMPUTATION TOE Right 11/04/2022   Procedure: AMPUTATION  TOE;  Surgeon: Dot Gazella, [name];  [name];  Service: Podiatry;  Laterality: Right;   AMPUTATION TOE Right 09/15/2023   Procedure: AMPUTATION TOE;  Surgeon: Jennefer Moats, [name];  [name];  Service: Orthopedics/Podiatry;  Laterality: Right;  Right hallux amputation   BONE BIOPSY Right 04/05/2023   Procedure: BONE BIOPSY THIRD & FOURTH;  Surgeon: Dot Gazella, [name];  [name];  Service: Podiatry;  Laterality: Right;   CARDIAC ELECTROPHYSIOLOGY STUDY AND ABLATION N/A 09/07/2020   Procedure: CARDIAC EP STUDY AND ABLATION (CTI)   CATARACT  EXTRACTION W/PHACO Right 01/16/2017   Procedure: CATARACT EXTRACTION PHACO AND INTRAOCULAR LENS PLACEMENT (IOC)  Right Complicated;  Surgeon: Annell Kidney, MD;  Location: [facility];  Service: Ophthalmology;  Laterality: Right;  IVA Block Healon 5 malyugin vision blue Diabetic - oral meds   CATARACT EXTRACTION W/PHACO Left 10/23/2022   Procedure: CATARACT EXTRACTION PHACO AND INTRAOCULAR LENS PLACEMENT (IOC) LEFT DIABETIC;  Surgeon: Clair Crews, MD;  Location: [facility];  Service: Ophthalmology;  Laterality: Left;  Diabetic   COLONOSCOPY     INCISION AND DRAINAGE OF WOUND Left 09/15/2023   Procedure: IRRIGATION AND DEBRIDEMENT WOUND;  Surgeon: Jennefer Moats, [name];  [name];  Service: Orthopedics/Podiatry;  Laterality: Left;  Left foot wound debridement, possible graft/biopsy   LOWER EXTREMITY ANGIOGRAPHY Right 11/02/2022   Procedure: Lower Extremity Angiography;  Surgeon: Celso College, MD;  Location: [facility] INVASIVE CV LAB;  Service: Cardiovascular;  Laterality: Right;   LOWER EXTREMITY ANGIOGRAPHY Right 09/13/2023   Procedure: Lower Extremity Angiography;  Surgeon: Jackquelyn Mass, MD;  Location: [facility] INVASIVE CV LAB;  Service: Cardiovascular;  Laterality: Right;   METATARSAL HEAD EXCISION Left 07/05/2018   Procedure: RESECTION FIRST METATARSAL INFECTED BONE AND SOFT TISSUE;  Surgeon: Sharlyn Deaner, [name];  [name];  Service: Podiatry;  Laterality: Left;   METATARSAL HEAD EXCISION Right 04/05/2023   Procedure: METATARSAL HEAD EXCISION THIRD & FOURTH;  Surgeon: Dot Gazella, [name];  [name];  Service: Podiatry;  Laterality: Right;   PACEMAKER INSERTION  09/15/2020   TOE AMPUTATION Left    TONSILLECTOMY      Medications Prior to Admission  Medication Sig Dispense Refill Last Dose/Taking   acetaminophen  (TYLENOL ) 500 MG tablet Take 2 tablets (1,000 mg total) by mouth 3 (three) times daily as needed (for pain).   Taking As Needed    aspirin  EC 81 MG tablet Take 1 tablet (81 mg total) by mouth daily. Swallow whole. 30 tablet 0 01/25/2024   atorvastatin  (LIPITOR ) 10 MG tablet Take 1 tablet (10 mg total) by mouth daily at 6 PM. 30 tablet 11 Taking   bumetanide  (BUMEX ) 2 MG tablet Take 1 tablet (2 mg total) by mouth daily. 90 tablet 3 Past Week   clopidogrel  (PLAVIX ) 75 MG tablet Take 1 tablet (75 mg total) by mouth daily with breakfast. 30 tablet 11 01/25/2024 Morning   Finerenone  (KERENDIA ) 10 MG TABS Take 1 tablet (10 mg total) by mouth daily. 30 tablet 5 Past Week   furosemide  (LASIX ) 40 MG tablet Take 40 mg by mouth 2 (two) times daily.   01/25/2024   gentamicin  cream (GARAMYCIN ) 0.1 % Apply 1 Application topically 2 (two) times daily. 30 g 1 Taking   glipiZIDE  (GLUCOTROL ) 10 MG tablet Take 1 tablet (10 mg total) by mouth 2 (two) times daily. 60 tablet 2 Past Month   losartan  (COZAAR ) 25 MG tablet Take 25 mg by mouth daily.  Taking   metolazone  (ZAROXOLYN ) 2.5 MG tablet Take 1 tablet (2.5 mg total) by mouth 2 (two) times a week. On Mondays & Fridays   Past Month   Multiple Vitamins-Minerals (MULTIVITAMIN WITH MINERALS) tablet Take 1 tablet by mouth daily.   01/25/2024   silver  sulfADIAZINE  (SILVADENE ) 1 % cream Apply to affected area daily 50 g 1 Taking   doxycycline  (VIBRA -TABS) 100 MG tablet Take 1 tablet (100 mg total) by mouth 2 (two) times daily. (Patient not taking: Reported on 01/26/2024) 20 tablet 0 Not Taking   metFORMIN  (GLUCOPHAGE ) 1000 MG tablet Take 1 tablet (1,000 mg total) by mouth 2 (two) times daily. (Patient not taking: Reported on 01/26/2024) 60 tablet 2 Not Taking   Social History   Socioeconomic History   Marital status: Single    Spouse name: Not on file   Number of children: Not on file   Years of education: Not on file   Highest education level: Not on file  Occupational History    Comment: [employer]  Tobacco Use   Smoking status: Never   Smokeless tobacco: Never  Vaping Use   Vaping  status: Never Used  Substance and Sexual Activity   Alcohol use: Not Currently    Comment: rarely   Drug use: Never   Sexual activity: Not Currently    Birth control/protection: None  Other Topics Concern   Not on file  Social History Narrative   Lives with roommate   Social Drivers of Health   Financial Resource Strain: Low Risk  (03/20/2023)   Received from [facility]   Overall Financial Resource Strain (CARDIA)    Difficulty of Paying Living Expenses: Not hard at all  Food Insecurity: No Food Insecurity (01/27/2024)   Hunger Vital Sign    Worried About Running Out of Food in the Last Year: Never true    Ran Out of Food in the Last Year: Never true  Transportation Needs: No Transportation Needs (01/27/2024)   PRAPARE - Administrator, Civil Service (Medical): No    Lack of Transportation (Non-Medical): No  Physical Activity: Not on file  Stress: No Stress Concern Present (09/07/2020)   Received from [facility], [facility] - Occupational Stress Questionnaire    Feeling of Stress : Not at all  Social Connections: Socially Isolated (01/27/2024)   Social Connection and Isolation Panel [NHANES]    Frequency of Communication with Friends and Family: More than three times a week    Frequency of Social Gatherings with Friends and Family: Twice a week    Attends Religious Services: Never    Database administrator or Organizations: No    Attends Banker Meetings: Never    Marital Status: Divorced  Catering manager Violence: Not At Risk (01/27/2024)   Humiliation, Afraid, Rape, and Kick questionnaire    Fear of Current or Ex-Partner: No    Emotionally Abused: No    Physically Abused: No    Sexually Abused: No    Family History  Problem Relation Age of Onset   Cancer Niece      Vitals:   02/03/24 0004 02/03/24 0400 02/03/24 0452 02/03/24 0758  BP:  (!) 122/59  117/69  Pulse:  88  80  Resp:   (!) 29  18  Temp: 98.9 F (37.2 C)  98.5 F (36.9 C) 98 F (36.7 C)  TempSrc:    Oral  SpO2:  99%  99%  Weight:      Height:        PHYSICAL EXAM General: chronically ill appearing elderly male, well nourished, in no acute distress. HEENT: Normocephalic and atraumatic. Neck: No JVD.  Lungs: Normal respiratory effort on room air.  Heart: HRRR. Normal S1 and S2 without gallops or murmurs.  Abdomen: Non-distended appearing.  Msk: Normal strength and tone for age. Extremities: Warm and well perfused. No clubbing, cyanosis. Trace pitting edema bilaterally.  Neuro: Oriented X 2. Psych: Altered  Labs: Basic Metabolic Panel: Recent Labs    02/02/24 0400 02/03/24 0351  NA 139 139  K 3.8 3.8  CL 104 106  CO2 21* 21*  GLUCOSE 171* 189*  BUN 74* 67*  CREATININE 1.41* 1.46*  CALCIUM  8.8* 8.9  MG  --  2.5*   Liver Function Tests: No results for input(s): "AST", "ALT", "ALKPHOS", "BILITOT", "PROT", "ALBUMIN" in the last 72 hours.  No results for input(s): "LIPASE", "AMYLASE" in the last 72 hours. CBC: Recent Labs    02/02/24 0400 02/03/24 0351  WBC 9.4 11.2*  HGB 11.1* 11.2*  HCT 35.4* 35.9*  MCV 86.3 86.9  PLT 216 234   Cardiac Enzymes: No results for input(s): "CKTOTAL", "CKMB", "CKMBINDEX", "TROPONINIHS" in the last 72 hours.  BNP: No results for input(s): "BNP" in the last 72 hours.  D-Dimer: No results for input(s): "DDIMER" in the last 72 hours. Hemoglobin A1C: No results for input(s): "HGBA1C" in the last 72 hours.  Fasting Lipid Panel: No results for input(s): "CHOL", "HDL", "LDLCALC", "TRIG", "CHOLHDL", "LDLDIRECT" in the last 72 hours. Thyroid Function Tests: No results for input(s): "TSH", "T4TOTAL", "T3FREE", "THYROIDAB" in the last 72 hours.  Invalid input(s): "FREET3" Anemia Panel: No results for input(s): "VITAMINB12", "FOLATE", "FERRITIN", "TIBC", "IRON", "RETICCTPCT" in the last 72 hours.   Radiology: DG Knee 1-2 Views Left Result Date:  01/30/2024 CLINICAL DATA:  Knee effusion. EXAM: LEFT KNEE - 2 VIEW COMPARISON:  None Available. FINDINGS: Osteopenia. Joint space loss mildly of the mediolateral compartment and patellofemoral joint with scattered osteophyte formation. Scattered vascular calcifications are identified. There is some chondrocalcinosis as well of the mediolateral compartments. Subchondral cyst formation along the medial tibial plateau. The lateral view are oblique limiting evaluation. No large effusion but evaluation of a a small effusion is limited. Repeat lateral view could be considered versus additional imaging. IMPRESSION: Degenerative changes. Osteopenia. Chondrocalcinosis. No large effusion but lateral views are limited due to rotation. Subtle effusion is not excluded. Further workup as clinically appropriate Electronically Signed   By: Adrianna Horde M.D.   On: 01/30/2024 16:58   US  EKG SITE RITE Result Date: 01/29/2024 If Site Rite image not attached, placement could not be confirmed due to current cardiac rhythm.  ECHOCARDIOGRAM COMPLETE Result Date: 01/29/2024    ECHOCARDIOGRAM REPORT   Patient Name:   Tyler [name] Date of Exam: 01/28/2024 Medical Rec #:  [id number]      Height:       78.0 in Accession #:    [id number]     Weight:       260.8 lb Date of Birth:  [birth year]      BSA:          2.527 m Patient Age:    77 years       BP:           103/75 mmHg Patient Gender: M              HR:  96 bpm. Exam Location:  [facility] Procedure: 2D Echo, Cardiac Doppler and Color Doppler (Both Spectral and Color            Flow Doppler were utilized during procedure). Indications:     NSTEMI I21.4  History:         Patient has no prior history of Echocardiogram examinations,                  most recent 07/04/2023. CHF and Cardiomyopathy, Previous                  Myocardial Infarction, Pacemaker, PAD and CKD, stage 3,                  Arrythmias:Bradycardia, Atrial Fibrillation and Atrial Flutter,                   Signs/Symptoms:Dyspnea and Syncope; Risk Factors:Diabetes,                  Hypertension and Dyslipidemia.  Sonographer:     Terrilee Few RCS Referring Phys:  [id number] Kenlee Maler Diagnosing Phys: Lida Reeks Alluri IMPRESSIONS  1. Left ventricular ejection fraction, by estimation, is 25 to 30%. The left ventricle has severely decreased function. The left ventricle demonstrates global hypokinesis. There is mild left ventricular hypertrophy. Left ventricular diastolic parameters  are indeterminate.  2. Right ventricular systolic function is moderately reduced. The right ventricular size is mildly enlarged. There is severely elevated pulmonary artery systolic pressure. The estimated right ventricular systolic pressure is 73.9 mmHg.  3. Left atrial size was mildly dilated.  4. Right atrial size was moderately dilated.  5. Eccentric mitral regurgitation which can be underestimated on this study. Recommend TEE for further evaluation. Moderate to severe mitral valve regurgitation. Mild to moderate mitral stenosis. Severe mitral annular calcification.  6. Sclerotic aortic valve with low flow low gradient aortic stenosis, probably mild to moderate.. The aortic valve is tricuspid.  7. Aortic dilatation noted. There is mild dilatation of the aortic root, measuring 44 mm. FINDINGS  Left Ventricle: Left ventricular ejection fraction, by estimation, is 25 to 30%. The left ventricle has severely decreased function. The left ventricle demonstrates global hypokinesis. There is mild left ventricular hypertrophy. Left ventricular diastolic parameters are indeterminate. Right Ventricle: The right ventricular size is mildly enlarged. No increase in right ventricular wall thickness. Right ventricular systolic function is moderately reduced. There is severely elevated pulmonary artery systolic pressure. The tricuspid regurgitant velocity is 4.06 m/s, and with an assumed right atrial pressure of 8 mmHg, the estimated right ventricular  systolic pressure is 73.9 mmHg. Left Atrium: Left atrial size was mildly dilated. Right Atrium: Right atrial size was moderately dilated. Pericardium: There is no evidence of pericardial effusion. Mitral Valve: Eccentric mitral regurgitation which can be underestimated on this study. Recommend TEE for further evaluation. There is mild thickening of the mitral valve leaflet(s). There is mild calcification of the mitral valve leaflet(s). Severe mitral annular calcification. Moderate to severe mitral valve regurgitation, with anteriorly-directed jet. Mild to moderate mitral valve stenosis. MV peak gradient, 14.7 mmHg. The mean mitral valve gradient is 8.4 mmHg. Tricuspid Valve: The tricuspid valve is normal in structure. Tricuspid valve regurgitation is mild. Aortic Valve: Sclerotic aortic valve with low flow low gradient aortic stenosis, probably mild to moderate. The aortic valve is tricuspid. Aortic valve mean gradient measures 10.0 mmHg. Aortic valve peak gradient measures 16.4 mmHg. Aortic valve area, by  VTI measures 1.43 cm. Pulmonic Valve: The  pulmonic valve was not well visualized. Pulmonic valve regurgitation is trivial. Aorta: Aortic dilatation noted. There is mild dilatation of the aortic root, measuring 44 mm. IAS/Shunts: The interatrial septum was not well visualized.  LEFT VENTRICLE PLAX 2D LVIDd:         5.50 cm   Diastology LVIDs:         4.50 cm   LV e' medial:    5.87 cm/s LV PW:         1.60 cm   LV E/e' medial:  22.5 LV IVS:        1.50 cm   LV e' lateral:   10.80 cm/s LVOT diam:     2.30 cm   LV E/e' lateral: 12.2 LV SV:         53 LV SV Index:   21 LVOT Area:     4.15 cm  RIGHT VENTRICLE            IVC RV S prime:     8.59 cm/s  IVC diam: 3.00 cm TAPSE (M-mode): 1.6 cm LEFT ATRIUM              Index        RIGHT ATRIUM           Index LA diam:        5.90 cm  2.33 cm/m   RA Area:     28.30 cm LA Vol (A2C):   114.0 ml 45.11 ml/m  RA Volume:   112.00 ml 44.32 ml/m LA Vol (A4C):   79.7 ml   31.54 ml/m LA Biplane Vol: 95.1 ml  37.63 ml/m  AORTIC VALVE AV Area (Vmax):    1.77 cm AV Area (Vmean):   1.56 cm AV Area (VTI):     1.43 cm AV Vmax:           202.50 cm/s AV Vmean:          144.500 cm/s AV VTI:            0.375 m AV Peak Grad:      16.4 mmHg AV Mean Grad:      10.0 mmHg LVOT Vmax:         86.13 cm/s LVOT Vmean:        54.333 cm/s LVOT VTI:          0.129 m LVOT/AV VTI ratio: 0.34  AORTA Ao Asc diam: 3.80 cm MITRAL VALVE                TRICUSPID VALVE MV Area (PHT): 2.66 cm     TR Peak grad:   65.9 mmHg MV Area VTI:   1.36 cm     TR Vmax:        406.00 cm/s MV Peak grad:  14.7 mmHg MV Mean grad:  8.4 mmHg     SHUNTS MV Vmax:       1.91 m/s     Systemic VTI:  0.13 m MV Vmean:      134.8 cm/s   Systemic Diam: 2.30 cm MV Decel Time: 285 msec MV E velocity: 132.00 cm/s MV A velocity: 98.50 cm/s MV E/A ratio:  1.34 Joetta Mustache Electronically signed by Joetta Mustache Signature Date/Time: 01/29/2024/1:23:56 PM    Final    CT Angio Chest Pulmonary Embolism (PE) W or WO Contrast Result Date: 01/27/2024 CLINICAL DATA:  Chest pain.  PE suspected EXAM: CT ANGIOGRAPHY CHEST WITH CONTRAST TECHNIQUE: Multidetector CT imaging of the chest was performed  using the standard protocol during bolus administration of intravenous contrast. Multiplanar CT image reconstructions and MIPs were obtained to evaluate the vascular anatomy. RADIATION DOSE REDUCTION: This exam was performed according to the departmental dose-optimization program which includes automated exposure control, adjustment of the mA and/or kV according to patient size and/or use of iterative reconstruction technique. CONTRAST:  75mL OMNIPAQUE  IOHEXOL  350 MG/ML SOLN COMPARISON:  Same day chest radiograph FINDINGS: Cardiovascular: Cardiomegaly. Mitral annular calcification. Coronary artery and aortic atherosclerotic calcification. Mild dilation of the ascending aorta measuring 40 mm in diameter. No pericardial effusion. Negative for acute pulmonary  embolism. Left chest wall pacemaker. Mediastinum/Nodes: Trachea and esophagus are unremarkable. Shotty mediastinal lymph nodes are likely reactive. Lungs/Pleura: Small right pleural effusion. Interlobular septal thickening and hazy ground-glass opacities with a lower lung and posterior predominance. No focal consolidation, pleural effusion, or pneumothorax. Upper Abdomen: No acute abnormality. Musculoskeletal: No acute fracture. Review of the MIP images confirms the above findings. IMPRESSION: 1. Negative for acute pulmonary embolism. 2. Findings suggestive of CHF with mild pulmonary edema. Small right pleural effusion. 3. 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 Recommend annual imaging followup by CTA or MRA. This recommendation follows 2010 ACCF/AHA/AATS/ACR/ASA/SCA/SCAI/SIR/STS/SVM Guidelines for the Diagnosis and Management of Patients with Thoracic Aortic Disease. Circulation. 2010; 121: Z610-R604. Aortic aneurysm NOS (ICD10-I71.9) 4. Aortic Atherosclerosis (ICD10-I70.0). Electronically Signed   By: Rozell Cornet M.D.   On: 01/27/2024 22:11   CT Head Wo Contrast Result Date: 01/26/2024 CLINICAL DATA:  Mental status change. EXAM: CT HEAD WITHOUT CONTRAST TECHNIQUE: Contiguous axial images were obtained from the base of the skull through the vertex without intravenous contrast. RADIATION DOSE REDUCTION: This exam was performed according to the departmental dose-optimization program which includes automated exposure control, adjustment of the mA and/or kV according to patient size and/or use of iterative reconstruction technique. COMPARISON:  Head CT 07/04/2018. FINDINGS: Brain: No evidence of acute infarction, hemorrhage, hydrocephalus, extra-axial collection or mass lesion/mass effect. There is mild diffuse atrophy and mild periventricular white matter hypodensity which is similar to the prior study. Vascular: Atherosclerotic calcifications are present within the cavernous internal carotid arteries.  Skull: Normal. Negative for fracture or focal lesion. Sinuses/Orbits: No acute finding. Other: None. IMPRESSION: 1. No acute intracranial process. 2. Mild diffuse atrophy and mild chronic small vessel ischemic changes. Electronically Signed   By: Tyron Gallon M.D.   On: 01/26/2024 20:18   DG Chest Portable 1 View Result Date: 01/26/2024 CLINICAL DATA:  Chest pain EXAM: PORTABLE CHEST 1 VIEW COMPARISON:  09/11/2023 FINDINGS: Left-sided pacing device similar in position. Cardiomegaly with mild central congestion. No consolidation, pleural effusion or pneumothorax. IMPRESSION: Cardiomegaly with mild central congestion. Electronically Signed   By: Esmeralda Hedge M.D.   On: 01/26/2024 20:14    ECHO as above  TELEMETRY reviewed by me 02/03/2024: ventricular pacing rate 80s  EKG reviewed by me: atrial fibrillation, rate 112 bpm PVCs.    Data reviewed by me 02/03/2024: last 24h vitals tele labs imaging I/O's hospitalist progress note, rapid response notes, ID notes, advanced heart failure notes, EP notes, ortho notes  Principal Problem:   NSTEMI (non-ST elevated myocardial infarction) (HCC) Active Problems:   Sepsis due to cellulitis (HCC)   Acute on chronic systolic CHF (congestive heart failure) (HCC)   Type II diabetes mellitus with renal manifestations (HCC)   Dyslipidemia   Essential hypertension   Acute kidney injury superimposed on stage 3a chronic kidney disease (HCC) - baseline scr 1.3   Pressure injury of skin  Septicemia due to group B Streptococcus (HCC)   Chronic respiratory failure with hypoxia (HCC)   Effusion of left knee   Pseudogout of left knee    ASSESSMENT AND PLAN:  Valiant Singler Belshe is a 78 y.o. male  with a past medical history of chronic HFrEF, persistent atrial fibrillation, mitral insufficiency, CHB s/p PPM 2021, hypertension, chronic kidney disease stage III who presented to the ED on 01/26/2024 for chest pain. Cardiology was consulted for further evaluation.   # Severe  sepsis # Strep agalactiae bacteremia Rapid response called overnight 5/6 for acute decompensated respiratory status, tachycardia, fever.  Meets sepsis criteria. Blood cultures growing strep agalactiae with high bioburden.  No clear evidence of any vegetation on TTE. -ID following, appreciate recommendations.  -ID recommends TEE, unable to be done today due to scheduling.  -Plan for TEE tomorrow at 12 PM.   # NSTEMI, type I vs type II # Acute on chronic HFrEF Patient reports to ED due to worsening SOB. Patient was seen outpatient cardiology on 09/2023 and there was plan for ischemic eval with PE stress test, however patient did not perform this exam or follow-up since this visit. Patient appears fluid overload on exam. Troponins elevated and trending 69 > 105 > 326 > 714 > 1300 > 1800. EKG in ED with AF PVCs with no acute ischemic changes, rate 112 bpm. Cr is elevated at 1.56 (baseline around 1.35). BP is stable. BNP elevated at 4043.  Rapid response called overnight 5/6 for acute decompensated respiratory status, tachycardia, fever.  Meets sepsis criteria.  Echo this admission with EF 25-30%, global hypokinesis, moderate RV dysfunction, severely elevated PASP, eccentric moderate to severe MR, low-flow low gradient mild to moderate aortic stenosis. -Continue to monitor mental status.  -Advanced heart failure following.  Appreciate recommendations. -Continue po Bumex  2 mg daily. Net negative 13L since admission.  -Home GDMT currently held given renal function, acute decompensated HF.  -Continue heparin  gtt.  -Continue ASA 81 mg, clopidogrel  75 mg, atorvastatin  80 mg daily. -Patient will eventually need ischemic evaluation.   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 Underwent PPM implant in 2021 for CHB, has had regular device checks since  implant. EKG in ED and per tele this AM AF rates in 100s.  -Beta-blocker as above. Will resume for rate control when patient is more stable from HF perspective. -Recommend anticoagulation however patient has historically deferred given prior bleeding issues. -EP consulted for further evaluation given high bioburden bacteremia and PPM in place. Appreciate their recommendations. May require extraction pending, they recommend TEE for further lead evaluation.   # Chronic kidney disease stage IIIa Patient with hx of CKD, baseline Cr 1.3-1.4. Cr this AM 2.13. BUN 98. -Continue to monitor renal function closely during diuresis. -Management per primary.  -Consider nephrology consult.  Patient with multiple co-morbidities, now declining mental status. Prognosis is guarded.    This patient's plan of care was discussed and created with Dr. Custovic and she is in agreement.  Signed: Hamp Levine, PA-C  02/03/2024, 10:10 AM [facility]

## 2024-02-03 NOTE — Progress Notes (Signed)
 PROGRESS NOTE    Caryl Ferdinand Jacque  JJO:[id number] DOB: 18-Sep-1946 DOA: 01/26/2024 PCP: McClanahan, Kyra, NP  Subjective: Pt seen and examined. Met with pt's dtr Carmel at bedside Pt is mostly oriented. He knows he is in the hospital being treated. He recognizes his dtr Mazon.  Cards unable to get pt onto TEE schedule for today but will attempt to get TEE tomorrow.   Hospital Course: Taken from H&P.  Marcella A Odekirk is a 78 y.o. 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  He was also experiencing 5/10, nonradiating dull chest pain with no other associated symptoms.  On presentation patient has mild tachycardia.  Labs with BUN of 41, creatinine 1.5, baseline seems to be around 1.3, CO2 of 17, troponin 69>.105 EKG shows A-fib with RVR, PVCs and LBBB. CT head was negative for any acute intracranial abnormality CXR with cardiomegaly and mild central congestion.  Patient was initially started on heparin  infusion and cardiology was consulted.  5/5: Vital stable.  Worsening leukocytosis at 21.9, platelet at 143, none anion gap metabolic acid with worsening of bicarb to 16, BNP significantly elevated at 4043. Troponin continued to increase, currently at 714. Cardiology started him on IV Bumex .  Echocardiogram ordered  5/6: Rapid response was called overnight when patient became very tachycardic and tachypneic, found to be febrile at 102.9.  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 Blood cultures started growing group B streptococcus this morning-antibiotics are being switched to ceftriaxone .  ID was consulted.  Patient also has a pacemaker in place. Rising troponin, maximum recorded at 1830, improving leukocytosis, potassium 3.3 and worsening creatinine at 2.05 so further IV Bumex  was held this morning.  HPI: Leiden Purple Kreiser is a 78 y.o. Caucasian male with medical history significant for HFpEF, osteoarthritis, atrial fibrillation and flutter, coronary artery disease, stage III chronic kidney disease, dyslipidemia and hypertension, as well as type 2 diabetes mellitus, who presented to the emergency room with a cancer of dizziness, lightheadedness and confusion that started around 4 PM today when he came back home from work.  This was associated with chest pain and dyspnea.  He describes his chest pain as dull aching graded 5/10 in severity with no radiation, nausea or vomiting or diaphoresis or palpitations.  His symptoms later resolved spontaneously.    He feels tactile fever without chills.  No cough or wheezing or vomitus.  No leg pain or edema recent travel or surgery.  No dysuria, oliguria or hematuria or flank pain.  No bleeding diathesis.   The patient was started on IV heparin  per ACS protocol. He will be admitted to a cardiac telemetry observation bed for further evaluation and management.   Significant Events: Admitted 01/26/2024 for chest pain 01-27-2024 blood cx positive for Group B Strep agalactiae  Significant Labs: Na 137, k 4.2, CO2 of 17, BUN 41, Scr 1.5, glu 189 Ca 9.3, TP 71. Alb 3.8 WBC 18.2, HgB 11.5, plt 164 Tropoin I 69-->105-->326 A1c 6.9% BNP 4043 01-27-2024 Blood  cx Group B strep agalactiae  Significant Imaging Studies: CT head No acute intracranial process. 2. Mild diffuse atrophy and mild chronic small vessel ischemic changes CXR Cardiomegaly with mild  central congestion  CTPA Negative for acute pulmonary embolism. 2. Findings suggestive of CHF with mild pulmonary edema. Small right pleural effusion. 3. 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 Recommend annual imaging followup by CTA or MRA. 01-28-2024 echo LVEF 25%  Antibiotic Therapy: Anti-infectives (From admission, onward)    Start     Dose/Rate Route Frequency Ordered Stop   01/28/24 1600  vancomycin  (VANCOREADY) IVPB 2000 mg/400 mL  Status:  Discontinued        2,000 mg 200 mL/hr over 120 Minutes Intravenous Every 24 hours 01/27/24 2009 01/28/24 0844   01/28/24 1600  cefTRIAXone  (ROCEPHIN ) 2 g in sodium chloride  0.9 % 100 mL IVPB        2 g 200 mL/hr over 30 Minutes Intravenous Daily 01/28/24 0844     01/28/24 0800  ceFEPIme  (MAXIPIME ) 2 g in sodium chloride  0.9 % 100 mL IVPB  Status:  Discontinued        2 g 200 mL/hr over 30 Minutes Intravenous Every 12 hours 01/27/24 1950 01/28/24 0844   01/27/24 2030  ceFEPIme  (MAXIPIME ) 2 g in sodium chloride  0.9 % 100 mL IVPB        2 g 200 mL/hr over 30 Minutes Intravenous  Once 01/27/24 1942 01/27/24 2242   01/27/24 2030  metroNIDAZOLE  (FLAGYL ) IVPB 500 mg  Status:  Discontinued        500 mg 100 mL/hr over 60 Minutes Intravenous Every 12 hours 01/27/24 1942 01/28/24 0844   01/27/24 2030  vancomycin  (VANCOCIN ) IVPB 1000 mg/200 mL premix  Status:  Discontinued        1,000 mg 200 mL/hr over 60 Minutes Intravenous  Once 01/27/24 1942 01/27/24 1948   01/27/24 1945  vancomycin  (VANCOREADY) IVPB 2000 mg/400 mL        2,000 mg 200 mL/hr over 120 Minutes Intravenous  Once 01/27/24 1948 01/27/24 2304   01/26/24 2300  doxycycline  (VIBRA -TABS) tablet 100 mg  Status:  Discontinued        100 mg Oral 2 times daily 01/26/24 2254 01/26/24 2346       Procedures:   Consultants: Cardiology CHF EP/Cards ID    Assessment and Plan: * NSTEMI (non-ST elevated myocardial infarction) (HCC) On admission through 01-28-2024 Concern of NSTEMI,  type I versus type II Increasing troponin, currently at 714.  Continue to have some chest discomfort. Cardiology is on board -Continue with heparin  infusion - Likely will get ischemic workup after diuresing - Continue with aspirin , Plavix  and atorvastatin   01-29-2024 remains on IV heparin  gtts. No word yet from cards on ischemic eval.  02-01-2024 remains on IV heparin . Ischemic workup at discretion of cards service.  01-30-2024 remains on IV heparin  gtts  02-01-2024 remains on IV heparin  gtts at discretion of cardiology service.  02-02-2024 remains on IV heparin  gtts at discretion of cardiology service. No decision yet made in regards to ischemic evaluation.  02-03-2024 remains on IV heparin  gtts at discretion of cardiology service. No decision yet made in regards to ischemic evaluation.  Septicemia due to group B Streptococcus (HCC) 01-29-2024 ID consulted. They want TEE to be performed due to high index of suspicion for endocarditis +/- PPM infection.  Cardiology deferring TEE until next week due to respiratory status.  01-30-2024 concern for endocarditis of valve or PPM leads.  01-31-2024 TEE canceled  today by cards due to altered mental status. They will re-evaluate next week. Cards has stated pt is not a candidate for device extraction. Continue with IV ABX under the direction of ID service. Currently on IV rocephin  2 gram daily.  02-01-2024 pt awaiting TEE. Maybe next week. Remains on IV rocephin  2 grams daily.  02-02-2024 yesterday pt's wound cultures from foot growing enterococcus and MRSA. IV ABX changed to IV vanco which will cover group B strep in blood and MRSA/enterococcus from foot wound. NPO after MN for potential TEE in AM.  02-03-2024 Cards unable to get pt onto TEE schedule for today but will attempt to get TEE tomorrow.  On IV vanco.  Acute kidney injury superimposed on stage 3a chronic kidney disease (HCC) - baseline scr 1.3 On admission through  01-28-2024. Worsening creatinine, AKI on chronic CKD stage IIIa. Worsening non-anion gap metabolic acidosis, multifactorial with AKI, CHF and also concern of sepsis. IV Bumex  was held by cardiology today -Giving gentle IV fluid with bicarb infusion -Monitor renal function -Avoid nephrotoxins  01-29-2024 worsening Scr today. Scr up to 2.33 today. Given his low EF I will avoid IVF for now. Cards gave him 160 mg of IV lasix  today.  Defer to cardiology to manage his AKI/CKD if they are continuing his diuresis.  01-30-2024 Scr 2.44 today.  Received 160 mg IV lasix . 2.3 liters in urine out yesterday. Pt states breathing is better. Defer to CHF team for further diuresis.  01-31-2024 scr 2.13, BUN 98 today.  has 4.7 liters in urine yesterday. Diuresis management by cardiology team.  Appears further IV diuretics on hold by cardiology team.  02-01-2024 3.5 liters in urine output. Did not receive any diuretics yesterday. Monitor renal function.  02-02-2024 Scr 1.41, BUN 74 today.  Urine output dropped off yesterday. Only 1.2 liters in urine today. Appears from cardiology note today that bumex  is going to be started. Monitor renal function.  02-02-2024 Scr 1.46, BUN 67 today. Pt started yesterday on 2 mg bumex  daily. 2.3 liter in urine output yesterday.  Acute on chronic systolic CHF (congestive heart failure) (HCC) On admission through 01-28-2024 Patient with volume overload, positive JVD and elevated BNP at 4043 Echo cardiogram ordered-pending -Cardiology started him on IV Bumex  2 mg twice daily which was held today due to worsening creatinine -Advanced heart failure team consulted -Continue with metolazone  -Daily weight and BMP -Strict intake and output  01-29-2024 will let cards and CHF team manage pt's volume status with diuretics, etc.  01-30-2024 management per CHF team. Yesterday echo LVEF 25%  01-31-2024 has 4.7 liters in urine yesterday. Diuresis management by cardiology team. Appears  further IV diuretics on hold by cardiology team.  02-01-2024 3.5 liters in urine output. Did not receive any diuretics yesterday. Monitor renal function.   02-02-2024 Scr 1.41, BUN 74 today. Urine output dropped off yesterday. Only 1.2 liters in urine today. Appears from cardiology note today that bumex  is going to be started. Monitor renal function.   02-02-2024 Scr 1.46, BUN 67 today. Pt started yesterday on 2 mg bumex  daily. 2.3 liter in urine output yesterday.   Sepsis due to cellulitis [facility]) On admission through 01-28-2024 Patient met sepsis criteria after developing high-grade fever with tachycardia overnight.  Likely due to lower extremity cellulitis and bilateral multiple foot wounds. Received broad-spectrum antibiotics overnight Preliminary blood cultures with group B strep agalactia - De-escalate antibiotics to ceftriaxone  - ID was consulted - Echocardiogram pending  01-29-2024 ID consulting. Concern for potential endocarditis +/-  PPM infection.   02-01-2024 pt awaiting TEE. Maybe next week. Remains on IV rocephin  2 grams daily. 01-31-2024 TEE canceled today by cards due to altered mental status. They will re-evaluate next week. Cards has stated pt is not a candidate for device extraction. Continue with IV ABX under the direction of ID service. Currently on IV rocephin  2 gram daily.  01-30-2024 potential for TEE tomorrow. Decision will be made tomorrow AM. NPO after MN in case TEE can be performed tomorrow. Remains on IV ABX.   02-02-2024 yesterday pt's wound cultures from foot growing enterococcus and MRSA. IV ABX changed to IV vanco which will cover group B strep in blood and MRSA/enterococcus from foot wound  02-03-2024 Cards unable to get pt onto TEE schedule for today but will attempt to get TEE tomorrow.  On IV vanco.  Pseudogout of left knee 01-31-2024 had left knee arthrocentesis by ortho yesterday. Fluid analysis shows pseudogout.  Color, Synovial YELLOW   Appearance-Synovial CLOUDY Abnormal   Crystals, Fluid EXTRACELLULAR CALCIUM  PYROPHOSPHATE CRYSTALS  Comment: INTRACELLULAR CALCIUM  PYROPHOSPHATE CRYSTALS  WBC, Synovial 24,791 High   Neutrophil, Synovial 93 High   Lymphocytes-Synovial Fld 1  Monocyte-Macrophage-Synovial Fluid 6 Low   Eosinophils-Synovial 0   02-02-2024 stable.  02-03-2024 stable.  Pressure injury of skin Agree with RN documentation regarding pressure injury to skin.  Pressure Injury 01/27/24 Heel Right Unstageable - Full thickness tissue loss in which the base of the injury is covered by slough (yellow, tan, gray, green or brown) and/or eschar (tan, brown or black) in the wound bed. (Active)  01/27/24 2000  Location: Heel  Location Orientation: Right  Staging: Unstageable - Full thickness tissue loss in which the base of the injury is covered by slough (yellow, tan, gray, green or brown) and/or eschar (tan, brown or black) in the wound bed.  Wound Description (Comments):   Present on Admission: Yes     Pressure Injury 01/27/24 Heel Right Stage 2 -  Partial thickness loss of dermis presenting as a shallow open injury with a red, pink wound bed without slough. (Active)  01/27/24 2000  Location: Heel  Location Orientation: Right  Staging: Stage 2 -  Partial thickness loss of dermis presenting as a shallow open injury with a red, pink wound bed without slough.  Wound Description (Comments):   Present on Admission: Yes     Pressure Injury 01/27/24 Heel Left Stage 2 -  Partial thickness loss of dermis presenting as a shallow open injury with a red, pink wound bed without slough. (Active)  01/27/24 2000  Location: Heel  Location Orientation: Left  Staging: Stage 2 -  Partial thickness loss of dermis presenting as a shallow open injury with a red, pink wound bed without slough.  Wound Description (Comments):   Present on Admission: Yes      Essential hypertension On admission through 01-28-2024 Blood pressure  within goal, being maintained with diuretics. Not on any other antihypertensives at home. -Continue to monitor  01-30-2024 stable. HTN meds on hold due to borderline low BP  01-31-2024 stable. HTN meds still on hold due to borderline low BP.  05-10-205 stable.  02-02-2024 stable  02-03-2024 stable.  Dyslipidemia On admission through 01-28-2024. Continue atorvastatin   01-30-2024 stable.  01-31-2024 stable  05-10-205 stable.  02-02-2024 stable  02-03-2024 stable.  Type II diabetes mellitus with renal manifestations (HCC) On admission through 01-28-2024 Will place the patient on supplement coverage with NovoLog . - Holding home glipizide  and metformin .  01-29-2024 stable.  01-30-2024  stable.  01-31-2024 stable  02-01-2024 stable.  02-02-2024 stable. Acceptable CBG ranges.  02-03-2024 stable.   DVT prophylaxis: Place TED hose Start: 01/29/24 0753  IV Heparin    Code Status: Full Code Family Communication: discussed with pt's dtr Carmel at bedside Disposition Plan: unknown Reason for continuing need for hospitalization: remains on IV ABX, IV heparin  gtts.  Objective: Vitals:   02/03/24 0004 02/03/24 0400 02/03/24 0452 02/03/24 0758  BP:  (!) 122/59  117/69  Pulse:  88  80  Resp:  (!) 29  18  Temp: 98.9 F (37.2 C)  98.5 F (36.9 C) 98 F (36.7 C)  TempSrc:    Oral  SpO2:  99%  99%  Weight:      Height:        Intake/Output Summary (Last 24 hours) at 02/03/2024 1001 Last data filed at 02/03/2024 0905 Gross per 24 hour  Intake --  Output 2850 ml  Net -2850 ml   Filed Weights   01/30/24 0518 01/31/24 0508 02/01/24 0500  Weight: 107.3 kg 103 kg 100.7 kg    Examination:  Physical Exam Vitals and nursing note reviewed.  Constitutional:      Comments: Awake. Easily arousable. Dtr Carmel at bedside.  HENT:     Head: Normocephalic and atraumatic.  Cardiovascular:     Rate and Rhythm: Normal rate and regular rhythm.  Pulmonary:     Effort: Pulmonary  effort is normal.     Breath sounds: Normal breath sounds.  Abdominal:     General: Bowel sounds are normal.     Palpations: Abdomen is soft.  Skin:    Capillary Refill: Capillary refill takes less than 2 seconds.     Comments: Both feet wrapped in bulky dressings  Neurological:     Comments: Oriented to situation. Knows its 2025. Recognizes his dtr Buhl.    Data Reviewed: I have personally reviewed following labs and imaging studies  CBC: Recent Labs  Lab 01/30/24 0248 01/31/24 0612 02/01/24 0455 02/02/24 0400 02/03/24 0351  WBC 8.3 8.3 9.4 9.4 11.2*  HGB 9.7* 9.6* 10.6* 11.1* 11.2*  HCT 30.4* 30.7* 33.9* 35.4* 35.9*  MCV 86.1 87.2 85.8 86.3 86.9  PLT 123* 152 173 216 234   Basic Metabolic Panel: Recent Labs  Lab 01/30/24 0248 01/31/24 0612 02/01/24 0455 02/02/24 0400 02/03/24 0351  NA 136 133* 139 139 139  K 3.3* 3.4* 3.5 3.8 3.8  CL 102 98 103 104 106  CO2 22 23 24  21* 21*  GLUCOSE 176* 189* 165* 171* 189*  BUN 98* 98* 86* 74* 67*  CREATININE 2.44* 2.13* 1.53* 1.41* 1.46*  CALCIUM  8.5* 8.4* 9.0 8.8* 8.9  MG 2.4  --   --   --  2.5*   GFR: Estimated Creatinine Clearance: 53 mL/min (A) (by C-G formula based on SCr of 1.46 mg/dL (H)). Liver Function Tests: Recent Labs  Lab 01/27/24 2043  AST 28  ALT 22  ALKPHOS 58  BILITOT 1.2  PROT 6.9  ALBUMIN 3.2*    Recent Labs  Lab 01/31/24 0956  AMMONIA 19   Coagulation Profile: Recent Labs  Lab 01/27/24 2043  INR 1.5*   BNP (last 3 results) Recent Labs    09/11/23 1627 10/28/23 1107 01/27/24 0844  BNP 3,874.2* 754.7* 4,043.5*   CBG: Recent Labs  Lab 02/02/24 0827 02/02/24 1159 02/02/24 1655 02/02/24 2047 02/03/24 0754  GLUCAP 166* 251* 209* 178* 194*    Recent Labs  Lab 01/27/24 1826 01/27/24 2043 01/27/24 2207  PROCALCITON  --  7.52  --   LATICACIDVEN 2.3* 1.5 1.4    Recent Results (from the past 240 hours)  Culture, blood (x 2)     Status: Abnormal   Collection Time:  01/27/24  8:43 PM   Specimen: BLOOD  Result Value Ref Range Status   Specimen Description   Final    BLOOD BLOOD LEFT ARM LAC Performed at [facility], [address] [place], [place] [address]    Special Requests   Final    BOTTLES DRAWN AEROBIC AND ANAEROBIC Blood Culture adequate volume Performed at [facility], [address] [place], [place] [address]    Culture  Setup Time   Final    GRAM POSITIVE COCCI IN BOTH AEROBIC AND ANAEROBIC BOTTLES CRITICAL RESULT CALLED TO, READ BACK BY AND VERIFIED WITH: TREY GREENWOOD 01/28/24 0811 MW GRAM STAIN REVIEWED-AGREE WITH RESULT DRT    Culture (A)  Final    GROUP B STREP(S.AGALACTIAE)ISOLATED SUSCEPTIBILITIES PERFORMED ON PREVIOUS CULTURE WITHIN THE LAST 5 DAYS. Performed at [facility], [address], [place], [place] [address]    Report Status 01/30/2024 FINAL  Final  Culture, blood (x 2)     Status: Abnormal   Collection Time: 01/27/24  8:49 PM   Specimen: BLOOD  Result Value Ref Range Status   Specimen Description   Final    BLOOD BLOOD LEFT HAND [facility] Performed at [facility], [address] [place], [place] [address]    Special Requests   Final    BOTTLES DRAWN AEROBIC AND ANAEROBIC Blood Culture adequate volume Performed at [facility], [address], [place], [place] [address]    Culture  Setup Time   Final    GRAM POSITIVE COCCI IN BOTH AEROBIC AND ANAEROBIC BOTTLES Organism ID to follow CRITICAL RESULT CALLED TO, READ BACK BY AND VERIFIED WITH: Kathyanne Parkers GREENWOOD 01/28/24 0811 MW GRAM STAIN REVIEWED-AGREE WITH RESULT DRT Performed at [facility], [address], [place], [place] [address]    Culture GROUP B STREP(S.AGALACTIAE)ISOLATED (A)  Final   Report Status 01/30/2024 FINAL  Final   Organism ID, Bacteria GROUP B STREP(S.AGALACTIAE)ISOLATED  Final      Susceptibility   Group b strep(s.agalactiae)isolated - MIC*    CLINDAMYCIN RESISTANT Resistant      AMPICILLIN <=0.25 SENSITIVE Sensitive     ERYTHROMYCIN >=8 RESISTANT Resistant     VANCOMYCIN  0.5 SENSITIVE Sensitive     CEFTRIAXONE  <=0.12 SENSITIVE Sensitive     LEVOFLOXACIN  0.5 SENSITIVE Sensitive     PENICILLIN <=0.06 SENSITIVE Sensitive     * GROUP B STREP(S.AGALACTIAE)ISOLATED  Blood Culture ID Panel (Reflexed)     Status: Abnormal   Collection Time: 01/27/24  8:49 PM  Result Value Ref Range Status   Enterococcus faecalis NOT DETECTED NOT DETECTED Final   Enterococcus Faecium NOT DETECTED NOT DETECTED Final   Listeria monocytogenes NOT DETECTED NOT DETECTED Final   Staphylococcus species NOT DETECTED NOT DETECTED Final   Staphylococcus aureus (BCID) NOT DETECTED NOT DETECTED Final   Staphylococcus epidermidis NOT DETECTED NOT DETECTED Final   Staphylococcus lugdunensis NOT DETECTED NOT DETECTED Final   Streptococcus species DETECTED (A) NOT DETECTED Final    Comment: CRITICAL RESULT CALLED TO, READ BACK BY AND VERIFIED WITH: TREY GREENWOOD 01/28/24 0811 MW    Streptococcus agalactiae DETECTED (A) NOT DETECTED Final    Comment: CRITICAL RESULT CALLED TO, READ BACK BY AND VERIFIED WITH: TREY GREENWOOD 01/28/24 0811 MW  Streptococcus pneumoniae NOT DETECTED NOT DETECTED Final   Streptococcus pyogenes NOT DETECTED NOT DETECTED Final   A.calcoaceticus-baumannii NOT DETECTED NOT DETECTED Final   Bacteroides fragilis NOT DETECTED NOT DETECTED Final   Enterobacterales NOT DETECTED NOT DETECTED Final   Enterobacter cloacae complex NOT DETECTED NOT DETECTED Final   Escherichia coli NOT DETECTED NOT DETECTED Final   Klebsiella aerogenes NOT DETECTED NOT DETECTED Final   Klebsiella oxytoca NOT DETECTED NOT DETECTED Final   Klebsiella pneumoniae NOT DETECTED NOT DETECTED Final   Proteus species NOT DETECTED NOT DETECTED Final   Salmonella species NOT DETECTED NOT DETECTED Final   Serratia marcescens NOT DETECTED NOT DETECTED Final   Haemophilus influenzae NOT DETECTED NOT DETECTED  Final   Neisseria meningitidis NOT DETECTED NOT DETECTED Final   Pseudomonas aeruginosa NOT DETECTED NOT DETECTED Final   Stenotrophomonas maltophilia NOT DETECTED NOT DETECTED Final   Candida albicans NOT DETECTED NOT DETECTED Final   Candida auris NOT DETECTED NOT DETECTED Final   Candida glabrata NOT DETECTED NOT DETECTED Final   Candida krusei NOT DETECTED NOT DETECTED Final   Candida parapsilosis NOT DETECTED NOT DETECTED Final   Candida tropicalis NOT DETECTED NOT DETECTED Final   Cryptococcus neoformans/gattii NOT DETECTED NOT DETECTED Final    Comment: Performed at [facility], [address] [place], [place] [address]  MRSA Next Gen by PCR, Nasal     Status: None   Collection Time: 01/28/24  1:00 AM   Specimen: Nasal Mucosa; Nasal Swab  Result Value Ref Range Status   MRSA by PCR Next Gen NOT DETECTED NOT DETECTED Final    Comment: (NOTE) The GeneXpert MRSA Assay (FDA approved for NASAL specimens only), is one component of a comprehensive MRSA colonization surveillance program. It is not intended to diagnose MRSA infection nor to guide or monitor treatment for MRSA infections. Test performance is not FDA approved in patients less than 62 years old. Performed at [facility], [address] [place], [place] [address]   Aerobic Culture w Gram Stain (superficial specimen)     Status: None   Collection Time: 01/29/24  6:35 AM   Specimen: Foot; Wound  Result Value Ref Range Status   Specimen Description   Final    FOOT RIGHT Performed at [facility], [address] [place], [place] [address]    Special Requests   Final    NONE Performed at [facility], [address] [place], [place] [address]    Gram Stain NO WBC SEEN RARE GRAM POSITIVE COCCI IN PAIRS   Final   Culture   Final    RARE STAPHYLOCOCCUS AUREUS SUSCEPTIBILITIES PERFORMED ON PREVIOUS CULTURE WITHIN THE LAST 5 DAYS. ABUNDANT CORYNEBACTERIUM  STRIATUM Standardized susceptibility testing for this organism is not available. Performed at [facility], [address], [place], [place] [address]    Report Status 02/01/2024 FINAL  Final  Aerobic Culture w Gram Stain (superficial specimen)     Status: None   Collection Time: 01/29/24  6:37 AM   Specimen: Foot; Wound  Result Value Ref Range Status   Specimen Description   Final    FOOT LEFT Performed at [facility], [address], [place], [place] [address]    Special Requests   Final    NONE Performed at [facility], [address] [place], [place] [address]    Gram Stain   Final    NO WBC SEEN RARE GRAM POSITIVE COCCI IN PAIRS  Performed at [facility], [address] [place], [place] [address]    Culture   Final    RARE METHICILLIN RESISTANT STAPHYLOCOCCUS AUREUS RARE ENTEROCOCCUS FAECALIS RARE GROUP B STREP(S.AGALACTIAE)ISOLATED TESTING AGAINST S. AGALACTIAE NOT ROUTINELY PERFORMED DUE TO PREDICTABILITY OF AMP/PEN/VAN SUSCEPTIBILITY. RARE FUNGUS (MOLD) ISOLATED, PROBABLE CONTAMINANT/COLONIZER (SAPROPHYTE). CONTACT MICROBIOLOGY IF FURTHER IDENTIFICATION REQUIRED [phone].    Report Status 02/01/2024 FINAL  Final   Organism ID, Bacteria METHICILLIN RESISTANT STAPHYLOCOCCUS AUREUS  Final   Organism ID, Bacteria ENTEROCOCCUS FAECALIS  Final      Susceptibility   Enterococcus faecalis - MIC*    AMPICILLIN <=2 SENSITIVE Sensitive     VANCOMYCIN  1 SENSITIVE Sensitive     GENTAMICIN  SYNERGY SENSITIVE Sensitive     * RARE ENTEROCOCCUS FAECALIS   Methicillin resistant staphylococcus aureus - MIC*    CIPROFLOXACIN  >=8 RESISTANT Resistant     ERYTHROMYCIN >=8 RESISTANT Resistant     GENTAMICIN  <=0.5 SENSITIVE Sensitive     OXACILLIN >=4 RESISTANT Resistant     TETRACYCLINE <=1 SENSITIVE Sensitive     VANCOMYCIN  1 SENSITIVE Sensitive     TRIMETH /SULFA  <=10 SENSITIVE Sensitive     CLINDAMYCIN <=0.25 SENSITIVE Sensitive     RIFAMPIN  <=0.5 SENSITIVE Sensitive     Inducible Clindamycin NEGATIVE Sensitive     LINEZOLID 2 SENSITIVE Sensitive     * RARE METHICILLIN RESISTANT STAPHYLOCOCCUS AUREUS  Culture, blood (Routine X 2) w Reflex to ID Panel     Status: None   Collection Time: 01/29/24  6:11 PM   Specimen: BLOOD  Result Value Ref Range Status   Specimen Description BLOOD BLOOD RIGHT HAND  Final   Special Requests   Final    BOTTLES DRAWN AEROBIC AND ANAEROBIC Blood Culture adequate volume   Culture   Final    NO GROWTH 5 DAYS Performed at [facility], [address] [place], [place] [address]    Report Status 02/03/2024 FINAL  Final  Culture, blood (Routine X 2) w Reflex to ID Panel     Status: None   Collection Time: 01/29/24  7:42 PM   Specimen: BLOOD  Result Value Ref Range Status   Specimen Description BLOOD BLOOD RIGHT HAND  Final   Special Requests   Final    BOTTLES DRAWN AEROBIC AND ANAEROBIC Blood Culture adequate volume   Culture   Final    NO GROWTH 5 DAYS Performed at [facility], [address], [place], [place] [address]    Report Status 02/03/2024 FINAL  Final  Body fluid culture w Gram Stain     Status: None (Preliminary result)   Collection Time: 01/30/24  4:00 PM   Specimen: Synovium; Body Fluid  Result Value Ref Range Status   Specimen Description   Final    SYNOVIAL Performed at [facility], [address], [place], [place] [address]    Special Requests   Final    L KNEE Performed at [facility], [address] [place], [place] [address]    Gram Stain   Final    MODERATE WBC PRESENT, PREDOMINANTLY PMN NO ORGANISMS SEEN    Culture   Final    NO GROWTH 3 DAYS Performed at [facility], [address] [place], [place] [address]    Report Status PENDING  Incomplete     Scheduled Meds:  vitamin C   500 mg Oral BID   aspirin   81 mg Oral Daily   atorvastatin   80  mg Oral q1800   bumetanide   2 mg Oral Daily   Chlorhexidine   Gluconate Cloth  6 each Topical Daily   feeding supplement (NEPRO CARB STEADY)  237 mL Oral BID BM   gentamicin  cream  1 Application Topical BID   insulin  aspart  0-15 Units Subcutaneous TID WC   insulin  aspart  0-5 Units Subcutaneous QHS   multivitamin with minerals  1 tablet Oral Daily   silver  sulfADIAZINE    Topical Daily   Continuous Infusions:  heparin  2,300 Units/hr (02/03/24 8657)   vancomycin  1,750 mg (02/02/24 1705)     LOS: 6 days   Time spent: 50 minutes  Unk Garb, DO  [facility]  02/03/2024, 10:01 AM

## 2024-02-03 NOTE — Progress Notes (Signed)
 SLP Cancellation Note  Patient Details Name: Tyler [name] MRN: [id number] DOB: 1946/01/18   Cancelled treatment:       Reason Eval/Treat Not Completed: Patient at procedure or test/unavailable (pt NPO for TEE. ST services will f/u tomorrow.)    Darla Edward, MS, CCC-SLP Speech Language Pathologist Rehab Services; [facility] [phone] (ascom) Trent Theisen 02/03/2024, 3:53 PM

## 2024-02-03 NOTE — Progress Notes (Signed)
 Occupational Therapy Treatment Patient Details Name: Griezmann Kulczyk Jech MRN: [id number] DOB: 1946-05-03 Today's Date: 02/03/2024   History of present illness Pt is a 78 year old male Admitted with A/C HFrEF, sepsis and NSTEMI.     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   OT comments  Upon entering the room, pt supine in bed with daughters present in room. Pt is agreeable to co-treatment with PT and OT this session. Pt needing max A of 2 for bed mobility and is able to manage static sitting with close supervision for several minutes. Pt stands x 2 reps with max A of 2 to come to upright position. Pt having found to have had BM and standing with max A and second person providing total A for peri care. Pt returning to supine and rolling L <> R with max A of 2 to complete hygiene needs. Pt fatigued at end of session and remains supine in bed. Call bell and all needed items within reach.       If plan is discharge home, recommend the following:  Two people to help with walking and/or transfers;A lot of help with bathing/dressing/bathroom;Assistance with cooking/housework;Help with stairs or ramp for entrance   Equipment Recommendations  BSC/3in1;Tub/shower seat;Wheelchair (measurements OT)       Precautions / Restrictions Precautions Precautions: Fall Recall of Precautions/Restrictions: Intact Restrictions Other Position/Activity Restrictions: per podiatry note on 4/29 "Patient brought his compression socks today.  No multilayer Unna boot compression wrap was applied today.  Wear compression socks daily  -Continue to advise against going barefoot.  Patient currently wearing good supportive tennis shoes  and sneakers.  Continue"       Mobility Bed Mobility Overal bed mobility: Needs Assistance Bed Mobility: Supine to Sit, Sit to Supine     Supine to sit: Max assist, Total assist, +2 for physical assistance, HOB elevated Sit to supine: Max assist, Total assist, +2 for physical assistance, +2 for safety/equipment   General bed mobility comments: +2 assistance to fully achieve EOB sittin. pt is able to progress RLE towards EOB however needs max assist to progress LLE. heavy use of draw pad to achieve short sit EOV. Vitals remained stable throughout session. pt severely deconditioned and weak.    Transfers Overall transfer level: Needs assistance Equipment used: Rolling walker (2 wheels) Transfers: Sit to/from Stand Sit to Stand: Max assist, +2 physical assistance, +2 safety/equipment, From elevated surface           General transfer comment: Pt stood 2 x EOB with poor tolerance and flxed posture. Unable to achieve full upright posture with only tolerating standing x ~ 20 secs. Was unaware he had BM. +2 assist to perform hygiene care however unable to stand long enough to fully be cleaned. returned to bed and performed rolling to finish hygiene care.     Balance Overall balance assessment: Needs assistance Sitting-balance support: Feet supported, Bilateral upper extremity supported Sitting balance-Leahy Scale: Fair Sitting balance - Comments: close supervision   Standing balance support: Bilateral upper extremity supported, During functional activity, Reliant on assistive device for balance Standing balance-Leahy Scale: Zero Standing balance comment: + 2 assistance to stand and maintain standing  ADL either performed or assessed with clinical judgement   ADL Overall ADL's : Needs assistance/impaired     Grooming: Bed level;Wash/dry face;Set up;Supervision/safety               Lower Body Dressing: Total assistance;Sitting/lateral  leans Lower Body Dressing Details (indicate cue type and reason): total A to don B socks               General ADL Comments: Pt incontinent of BM and needing +2 assistance for hygiene    Extremity/Trunk Assessment Upper Extremity Assessment Upper Extremity Assessment: Generalized weakness   Lower Extremity Assessment Lower Extremity Assessment: Generalized weakness        Vision Patient Visual Report: No change from baseline           Communication Communication Communication: No apparent difficulties   Cognition Arousal: Alert Behavior During Therapy: WFL for tasks assessed/performed Cognition: No apparent impairments                               Following commands: Intact        Cueing   Cueing Techniques: Verbal cues, Tactile cues  Exercises              Pertinent Vitals/ Pain       Pain Assessment Pain Assessment: PAINAD Breathing: occasional labored breathing, short period of hyperventilation Negative Vocalization: occasional moan/groan, low speech, negative/disapproving quality Facial Expression: smiling or inexpressive Body Language: relaxed Consolability: no need to console PAINAD Score: 2         Frequency  Min 2X/week        Progress Toward Goals  OT Goals(current goals can now be found in the care plan section)  Progress towards OT goals: Progressing toward goals      AM-PAC OT "6 Clicks" Daily Activity     Outcome Measure   Help from another person eating meals?: None Help from another person taking care of personal grooming?: A Little Help from another person toileting, which includes using toliet, bedpan, or urinal?: A Lot Help from another person bathing (including washing, rinsing, drying)?: A Lot Help from another person to put on and taking off regular upper body clothing?: A Little Help from another person to put on and taking off regular lower body clothing?: A Lot 6 Click Score: 16    End of  Session Equipment Utilized During Treatment: Oxygen  OT Visit Diagnosis: Other abnormalities of gait and mobility (R26.89);Unsteadiness on feet (R26.81)   Activity Tolerance Patient limited by fatigue   Patient Left in bed;with call bell/phone within reach;with bed alarm set   Nurse Communication Mobility status        Time: 1610-9604 OT Time Calculation (min): 28 min  Charges: OT General Charges $OT Visit: 1 Visit OT Treatments $Therapeutic Activity: 8-22 mins  George Kinder, MS, OTR/L , CBIS ascom [phone]  02/03/24, 2:59 PM

## 2024-02-03 NOTE — Progress Notes (Signed)
 Palliative Care Progress Note, Assessment & Plan   Patient Name: Tyler [name]       Date: 02/03/2024 DOB: 16-Jan-1946  Age: 78 y.o. MRN#: [id number] Attending Physician: Unk Garb, DO Primary Care Physician: McClanahan, Kyra, NP Admit Date: 01/26/2024  Subjective: Patient is lying in bed, awake and alert.  He acknowledges my presence and is able to make his wishes known.  He takes a few bites of food from his daughter [name] before falling asleep.  His daughters [name] and [name] at bedside during my visit.  HPI: 78 y.o. male  with past medical history of HFpEF, A-fib/flutter, AV block s/p PPM (2021), CAD, CKD stage III, hypertension and hyperlipidemia admitted from home on 01/26/2024 with dizziness, lightheadedness and confusion.  Also reported dull chest pain.   EKG on arrival showed A-fib with RVR, PVCs and L BBB Elevated troponin and significantly elevated BNP Chest x-ray with cardiomegaly and congestion   During this admission found to have positive blood cultures growing group B streptococcus, ID consulted Concern for endocarditis and or pacing wire vegetation as well as concern for left knee septic arthritis S/p left knee aspiration   Advanced heart failure team also following TEE on hold due to AKI as well as AMS   Palliative medicine was consulted for assisting with goals of care conversations  Summary of counseling/coordination of care: Extensive chart review completed prior to meeting patient including labs, vital signs, imaging, progress notes, orders, and available advanced directive documents from current and previous encounters.   After reviewing the patient's chart and assessing the patient at bedside-patient was asleep for majority of my visit, I spoke with patient's daughters  outside of the room in regards to symptom management and goals of care.   I attempted to elicit daughter's understanding of patient's current medical situation.  Briefly discussed cardiology, heart failure, and hospitalist recommendations for TEE EE.  Daughters voiced concerns regarding patient's strength and ability to tolerate TEE and perhaps removal of pacemaker.  Risk and benefits of proceeding with current plan of care reviewed.  Space and opportunity provided for daughters to share their thoughts and emotions and asked questions.  I attempted to elicit values and goals important to the patient.  Family shares he has always been clear that he would never want life-prolonging measures.  Both daughters know that patient would never want to be on a ventilator or receive CPR in the event of a cardiopulmonary arrest.  Discussed the difference between DNR, DNI, and full code.  Daughters are in agreement for DNR with limited interventions.  Goldenrod form completed and copy sent to medical records for download into ACP tab of epic.  Goldenrod form placed in paper/shadow chart.  Attending and RN made aware of adjustment to CODE STATUS.  Family shares they are in agreement with TEE but still would like to consider whether or not patient should undergo removal of pacemaker if it is needed.  Ongoing discussions and support to continue.  PMT will continue to follow.  Physical Exam Vitals reviewed.  Constitutional:      General: He is not in acute distress.    Appearance: He is normal weight.  HENT:  Head: Normocephalic.  Pulmonary:     Effort: Pulmonary effort is normal.  Musculoskeletal:     Cervical back: Normal range of motion.  Skin:    General: Skin is warm and dry.  Neurological:     Mental Status: He is alert.  Psychiatric:        Mood and Affect: Mood normal. Mood is not anxious.        Behavior: Behavior is not agitated.             Total Time 50 minutes   Time spent includes:  Detailed review of medical records (labs, imaging, vital signs), medically appropriate exam (mental status, respiratory, cardiac, skin), discussed with treatment team, counseling and educating patient, family and staff, documenting clinical information, medication management and coordination of care.  Tyler Nose L. Rebbeca Campi, DNP, FNP-BC Palliative Medicine Team

## 2024-02-03 NOTE — Progress Notes (Signed)
 ANTICOAGULATION CONSULT NOTE  Pharmacy Consult for heparin  infusion Indication: Atrial fibrillation  Allergies  Allergen Reactions   Quinolones Other (See Comments)    Patient has aortic aneurysm - fluoroquinolones are contraindicated due to risk of aortic rupture.   Eliquis  [Apixaban ]     Dizziness  and vision change   Spironolactone  Other (See Comments)    dizziness    Patient Measurements: Height: 6\' 6"  (198.1 cm) Weight: 100.7 kg (222 lb 0.1 oz) IBW/kg (Calculated) : 91.4 Heparin  Dosing Weight: 115.5 kg  Vital Signs: Temp: 98.5 F (36.9 C) (05/12 0452) BP: 122/59 (05/12 0400) Pulse Rate: 88 (05/12 0400)  Labs: Recent Labs    02/01/24 0455 02/02/24 0400 02/03/24 0351  HGB 10.6* 11.1* 11.2*  HCT 33.9* 35.4* 35.9*  PLT 173 216 234  HEPARINUNFRC 0.37 0.38 0.35  CREATININE 1.53* 1.41* 1.46*    Estimated Creatinine Clearance: 53 mL/min (A) (by C-G formula based on SCr of 1.46 mg/dL (H)).   Medical History: Past Medical History:  Diagnosis Date   (HFpEF) heart failure with preserved ejection fraction (HCC) 03/01/2020   a.) TTE 03/01/2020: EF 55-60%, mod MAC, mod AoV sclerosis, triv AR, mild TR, mod MR, RVSP 50-59; b.) TTE 09/10/2020: EF 55-60%, mod MAC, mod AoV sclerosis, mild TR, 3+ MR, RVSP 50-59; c.) TTE 09/26/2020: EF 55-60%, mild LA dil, triv PR, mild MR/TR, RVSP 37-49   Adenoma of left adrenal gland    Anemia    Arthritis    Atrial fibrillation and flutter (HCC)    a.) CHA2DS2VASc = 5 (age x2, HFpEF, HTN, T2DM);  b.) s/p CTI ablation 09/07/2020; c.) rate/rhythm maintained on oral carvedilol ; not on chronic anticoagulation therapy   CAD (coronary artery disease)    Cardiomegaly    CKD (chronic kidney disease), stage III (HCC)    DOE (dyspnea on exertion)    Drug-induced bradycardia    Gangrene of toe of left foot (HCC)    a.) 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   H/O active rheumatic fever 08/22/2020   Hepatosplenomegaly    History of bilateral  cataract extraction    HLD (hyperlipidemia)    Hypertension    Long term current use of aspirin     Lymphedema of both lower extremities    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)    a.) s/p amputation 11/04/2022   Peripheral vascular disease (HCC)    Pleural effusion on right 09/09/2020   a.) s/p RIGHT thoracentesis with 2180 cc yield   Pneumonia    Presence of permanent cardiac pacemaker 09/10/2020   a.) TVP placement 09/10/2020 due to intermittent CHB in setting of urosepsis; b.) s/p PPM placement 09/15/2020: MDT Azure XT DR (SN: [id number])   Pulmonary hypertension (HCC) 03/01/2020   a.) TTE: 03/01/2020: RVSP 50-59; b.) TTE 09/10/2020: RVSP 50-59; c.) TTE 09/26/2020: RVSP 37-49   RA (rheumatoid arthritis) (HCC)    Rheumatic fever    Sepsis (HCC) 09/10/2020   a.) urosepsis --> BC x 2 sets and UC all grew out significant Proteus mirabilis; admitted to [facility] 09/07/2020 - 09/27/2020.   Sick sinus syndrome [facility])    a.) s/p MDT PPM placement 09/15/2020   T2DM (type 2 diabetes mellitus) (HCC)    Urinary retention    chronic, with indwelling Foley catheter and plans for a suprapubic   Wears dentures    full upper    Assessment: Pt is a 78 yo male presenting to ED complaining of feeling of dizziness and confusion.  Peak trop of 1.8K. No DOAC PTA. PMH: HFpEF, OA, afib/flutter, pacemaker, CAD, CKD 3, HLD, HTN, PAD, DM. Of note, patient has previously refused oral anticoagulants.    5/5 0844 HL 0.27    SUBtherapeutic 5/5 1826 HL 0.39    Therapeutic x 1 5/6 0401 HL 0.20    SUBtherapeutic  5/6 1340 HL 0.24    SUBtherapeutic 5/6 2256 HL 0.41    Therapeutic x 1  5/7 0701 HL 0.45    Therapeutic x 2 5/8 0248 HL 0.44    Therapeutic x 3  5/9 0743 HL 0.51    Therapeutic x 4 5/10 0455 HL 0.37  Therapeutic X 5 5/11 0400 HL 0.38  Therapeutic x 6 5/12 0351 HL 0.35  Therapeutic x 7  No bleeding noted. Hgb down slightly to 9.6 and plt count up to 152K. Initial check this AM  was supratherapeutic (>1.1) after being therapeutic for 3 days. Suspect it was an inappropriate draw, so heparin  level was repeat and returned therapeutic. Patient has been on triple therapy, however, clopidogrel  was stopped today.  Goal of Therapy:  Heparin  level 0.3-0.7 units/ml Monitor platelets by anticoagulation protocol: Yes   Plan:  5/12:  HL @ 0351 = 0.35, therapeutic X 7 - will continue pt on current rate and recheck HL on 5/13 with AM labs - CBC daily  Coretta Dexter, PharmD, [facility] 02/03/2024 5:05 AM

## 2024-02-03 NOTE — [place] FL2 (Signed)
 [place]  MEDICAID FL2 LEVEL OF CARE FORM     IDENTIFICATION  Patient Name: Justis Belschner Whitt Birthdate: [birth year] Sex: male Admission Date (Current Location): 01/26/2024  [place] and [place] Number:  Chiropodist and Address:  [facility], [address], [place], [place] [address]      Provider Number: [id number]  Attending Physician Name and Address:  Unk Garb, DO  Relative Name and Phone Number:       Current Level of Care: Hospital Recommended Level of Care: Skilled Nursing Facility Prior Approval Number:    Date Approved/Denied:   PASRR Number: [id number]  Discharge Plan: SNF    Current Diagnoses: Patient Active Problem List   Diagnosis Date Noted   Effusion of left knee 01/31/2024   Pseudogout of left knee 01/31/2024   Chronic respiratory failure with hypoxia (HCC) 01/30/2024   Septicemia due to group B Streptococcus (HCC) 01/29/2024   Pressure injury of skin 01/28/2024   Acute kidney injury superimposed on stage 3a chronic kidney disease (HCC) - baseline scr 1.3 01/27/2024   Dyslipidemia 01/26/2024   Essential hypertension 01/26/2024   NSTEMI (non-ST elevated myocardial infarction) (HCC) 01/26/2024   Infected plantar ulcers both feet (HCC) 09/12/2023   Moderate mitral regurgitation by prior echocardiogram 09/11/2023   Nonischemic cardiomyopathy (HCC) 09/11/2023   Status cardiac pacemaker 09/11/2023   Anemia due to chronic kidney disease 09/11/2023   Uncontrolled type 2 diabetes mellitus with hyperglycemia, without long-term current use of insulin  (HCC) 09/11/2023   Chronic bilateral pleural effusions 09/11/2023   Complete heart block (HCC)  s/p PACEMAKER 09/11/2023   Type 2 diabetes mellitus with chronic kidney disease, without long-term current use of insulin  (HCC) 08/14/2023   Acute on chronic heart failure with reduced ejection fraction (HFrEF,30-35 %) (HCC) 08/13/2023   Acute on chronic systolic CHF (congestive heart  failure) (HCC) 07/02/2023   Type II diabetes mellitus with renal manifestations (HCC) 07/02/2023   Chronic kidney disease, stage 3a (HCC) 07/02/2023   Type 2 diabetes mellitus with complication, without long-term current use of insulin  (HCC) 10/31/2022   Persistent atrial fibrillation (HCC) 10/31/2022   HTN (hypertension) 10/31/2022   PAD (peripheral artery disease) (HCC) 10/31/2022   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 Extra Gravies on the meats, potatoes. NO STRAWS! May have baked/sweet potatoes per Speech ok w/ butter.)  AMBULATORY STATUS COMMUNICATION OF NEEDS Skin   Extensive Assist Verbally PU Stage and Appropriate Care, Other (Comment) (Erythema/redness, scratch marks. Unstageable pressure injury on right heel: Gauze daily.)   PU Stage 2 Dressing: Daily (Both heels: Gauze.)                   Personal Care Assistance Level of Assistance  Bathing, Feeding, Dressing Bathing Assistance: Maximum assistance Feeding assistance: Limited assistance Dressing Assistance: Maximum assistance     Functional Limitations Info  Sight, Hearing, Speech Sight Info: Adequate Hearing Info: Adequate Speech Info:  Adequate    SPECIAL CARE FACTORS FREQUENCY  Speech therapy     PT Frequency: 5 x week OT Frequency: 5 x week     Speech Therapy Frequency: 5 x week      Contractures Contractures  Info: Not present    Additional Factors Info  Code Status, Allergies, Isolation Precautions Code Status Info: Full code Allergies Info: Quinolones, Eliquis  (Apixaban ), Spironolactone      Isolation Precautions Info: Contact: MRSA     Current Medications (02/03/2024):  This is the current hospital active medication list Current Facility-Administered Medications  Medication Dose Route Frequency Provider Last Rate Last Admin   acetaminophen  (TYLENOL ) tablet 650 mg  650 mg Oral Q6H PRN Mansy, Jan A, MD   650 mg at 02/03/24 0033   Or   acetaminophen  (TYLENOL ) suppository 650 mg  650 mg Rectal Q6H PRN Mansy, Jan A, MD       ascorbic acid  (VITAMIN C ) tablet 500 mg  500 mg Oral BID Unk Garb, DO   500 mg at 02/03/24 0957   aspirin  chewable tablet 81 mg  81 mg Oral Daily Alluri, Krishna C, MD   81 mg at 02/03/24 0958   atorvastatin  (LIPITOR ) tablet 80 mg  80 mg Oral q1800 Mansy, Jan A, MD   80 mg at 02/02/24 1703   bumetanide  (BUMEX ) tablet 2 mg  2 mg Oral Daily Alluri, Krishna C, MD   2 mg at 02/03/24 8119   Chlorhexidine  Gluconate Cloth 2 % PADS 6 each  6 each Topical Daily Mansy, Jan A, MD   6 each at 02/03/24 0909   feeding supplement (NEPRO CARB STEADY) liquid 237 mL  237 mL Oral BID BM Unk Garb, DO   237 mL at 01/30/24 1478   gentamicin  cream (GARAMYCIN ) 0.1 % 1 Application  1 Application Topical BID Mansy, Jan A, MD   1 Application at 02/03/24 0909   heparin  ADULT infusion 100 units/mL (25000 units/250mL)  2,300 Units/hr Intravenous Continuous Trinidad Funk, RPH 23 mL/hr at 02/03/24 2956 2,300 Units/hr at 02/03/24 2130   insulin  aspart (novoLOG ) injection 0-15 Units  0-15 Units Subcutaneous TID WC Mansy, Jan A, MD   5 Units at 02/03/24 1210   insulin  aspart (novoLOG ) injection 0-5 Units  0-5 Units Subcutaneous QHS Mansy, Jan A, MD       magnesium  hydroxide (MILK OF MAGNESIA) suspension 30 mL  30 mL Oral Daily PRN Mansy, Jan A, MD   30 mL at 02/01/24 1315   melatonin tablet 5 mg  5 mg  Oral QHS PRN Unk Garb, DO       multivitamin with minerals tablet 1 tablet  1 tablet Oral Daily Mansy, Jan A, MD   1 tablet at 02/03/24 8657   nitroGLYCERIN  (NITROSTAT ) SL tablet 0.4 mg  0.4 mg Sublingual Q5 min PRN Mansy, Jan A, MD   0.4 mg at 01/27/24 8469   ondansetron  (ZOFRAN ) tablet 4 mg  4 mg Oral Q6H PRN Mansy, Jan A, MD       Or   ondansetron  (ZOFRAN ) injection 4 mg  4 mg Intravenous Q6H PRN Mansy, Jan A, MD       silver  sulfADIAZINE  (SILVADENE ) 1 % cream   Topical Daily Mansy, Anastasio Kaska, MD   Given at 02/03/24 6295   vancomycin  (VANCOREADY) IVPB 1750 mg/350 mL  1,750 mg Intravenous Q24H Madelynn Schilder, [facility] 175 mL/hr at 02/02/24 1705 1,750 mg at 02/02/24 1705     Discharge Medications: Please see discharge summary  for a list of discharge medications.  Relevant Imaging Results:  Relevant Lab Results:   Additional Information SS#: [id number]  Odilia Bennett, LCSW

## 2024-02-03 NOTE — Progress Notes (Addendum)
 Physical Therapy Treatment Patient Details Name: Tyler [name] MRN: [id number] DOB: 1946-02-07 Today's Date: 02/03/2024   History of Present Illness Pt is a 78 year old male Admitted with A/C HFrEF, sepsis and NSTEMI.     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    PT Comments  Pt was long sitting in bed with supportive daughters (x2) present. Pt is more alert and does fully participate however required +2 assistance for all mobility and transfers. Pt 's RLE much stronger than LLE. +2 max assist to achieve EOB sitting and to stand 2 x. Poor standing and sitting posture (flexed). Pt only tolerated standing ~ 20 sec before needing seated rest. Unaware he had BM and but unable to tolerate standing long enough for hygiene care to be performed. Pt returned to bed +2 max and then hygiene care performed.Pt vitals remained stable throughout session on 1 L o2. Acute PT will continue to follow per current POC progressing as able. Dc recs remain appropriate.     If plan is discharge home, recommend the following: Two people to help with walking and/or transfers;Assist for transportation;Assistance with cooking/housework;Help with stairs or ramp for entrance;Two people to help with bathing/dressing/bathroom     Equipment Recommendations  Other (comment) (Defer to next level of care)       Precautions / Restrictions Precautions Precautions: Fall Recall of Precautions/Restrictions: Intact Restrictions Weight Bearing Restrictions Per Provider Order: No     Mobility  Bed Mobility Overal bed mobility: Needs Assistance Bed Mobility: Supine to Sit, Sit to Supine  Supine to sit: Max assist, Total assist, +2 for  physical assistance, HOB elevated Sit to supine: Max assist, Total assist, +2 for physical assistance, +2 for safety/equipment General bed mobility comments: +2 assistance to fully achieve EOB sittin. pt is able to progress RLE towards EOB however needs max assist to progress LLE. heavy use of draw pad to achieve short sit EOV. Vitals remained stable throughout session. pt severely deconditioned and weak.    Transfers Overall transfer level: Needs assistance Equipment used: Rolling walker (2 wheels) Transfers: Sit to/from Stand Sit to Stand: Max assist, +2 physical assistance, +2 safety/equipment, From elevated surface  General transfer comment: Pt stood 2 x EOB with poor tolerance and flxed posture. Unable to achieve full upright posture with only tolerating standing x ~ 20 secs. Was unaware he had BM. +2 assist to perform hygiene care however unable to stand long enough to fully be cleaned. returned to bed and performed rolling to finish hygiene care.    Ambulation/Gait  General Gait Details: Currently unable    Balance Overall balance assessment: Needs assistance Sitting-balance support: Feet supported, Bilateral upper extremity supported Sitting balance-Leahy Scale: Fair Sitting balance - Comments: close supervision   Standing balance support: Bilateral upper extremity supported, During functional activity, Reliant on assistive device for balance Standing balance-Leahy Scale: Zero Standing balance comment: + 2 assistance to stand and maintain standing      Communication Communication Communication: No apparent difficulties  Cognition Arousal: Alert (keeps eyes closed however much more alert than last few times authro attempted to treat pt) Behavior During Therapy: WFL for tasks assessed/performed   PT - Cognitive impairments: Awareness    PT - Cognition Comments: Overall much more alert today  than previous session attempts. was able to stay awake and fully participate however he  remains extremely weak Following commands: Intact      Cueing Cueing Techniques: Verbal cues, Tactile cues     General Comments General comments (skin integrity, edema, etc.): Pt did put forth great effort throughout session but remains extremely weak      Pertinent Vitals/Pain Pain Assessment Pain Assessment: PAINAD Breathing: occasional labored breathing, short period of hyperventilation Negative Vocalization: occasional moan/groan, low speech, negative/disapproving quality Facial Expression: smiling or inexpressive Body Language: relaxed Consolability: no need to console PAINAD Score: 2 Pain Location: mostly L knee with ROM Pain Descriptors / Indicators: Grimacing, Discomfort Pain Intervention(s): Limited activity within patient's tolerance, Monitored during session, Repositioned     PT Goals (current goals can now be found in the care plan section) Acute Rehab PT Goals Patient Stated Goal: none stated but agreeable to rehab. supportive daughters x2 present throughout Progress towards PT goals: Progressing toward goals    Frequency    Min 3X/week       AM-PAC PT "6 Clicks" Mobility   Outcome Measure  Help needed turning from your back to your side while in a flat bed without using bedrails?: A Lot Help needed moving from lying on your back to sitting on the side of a flat bed without using bedrails?: Total Help needed moving to and from a bed to a chair (including a wheelchair)?: Total Help needed standing up from a chair using your arms (e.g., wheelchair or bedside chair)?: Total Help needed to walk in hospital room?: Total Help needed climbing 3-5 steps with a railing? : Total 6 Click Score: 7    End of Session   Activity Tolerance: Patient limited by fatigue Patient left: in bed;with call bell/phone within reach;with nursing/sitter in room Nurse Communication: Mobility status PT Visit Diagnosis: Unsteadiness on feet (R26.81);Muscle weakness (generalized)  (M62.81);Difficulty in walking, not elsewhere classified (R26.2)     Time: 1610-9604 PT Time Calculation (min) (ACUTE ONLY): 28 min  Charges:    $Therapeutic Activity: 8-22 mins PT General Charges $$ ACUTE PT VISIT: 1 Visit                     Chester Costa PTA 02/03/24, 11:29 AM

## 2024-02-03 NOTE — Progress Notes (Signed)
 Date of Admission:  01/26/2024      ID: Tyler [name] is a 78 y.o. 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)   Type II diabetes mellitus with renal manifestations (HCC)   Dyslipidemia   Essential hypertension   Acute kidney injury superimposed on stage 3a chronic kidney disease (HCC) - baseline scr 1.3   Pressure injury of skin   Septicemia due to group B Streptococcus (HCC)   Chronic respiratory failure with hypoxia (HCC)   Effusion of left knee   Pseudogout of left knee    Subjective: Pt is tired and sleepy  Medications:   vitamin C   500 mg Oral BID   aspirin   81 mg Oral Daily   atorvastatin   80 mg Oral q1800   bumetanide   2 mg Oral Daily   Chlorhexidine  Gluconate Cloth  6 each Topical Daily   feeding supplement (NEPRO CARB STEADY)  237 mL Oral BID BM   gentamicin  cream  1 Application Topical BID   insulin  aspart  0-15 Units Subcutaneous TID WC   insulin  aspart  0-5 Units Subcutaneous QHS   multivitamin with minerals  1 tablet Oral Daily   silver  sulfADIAZINE    Topical Daily    Objective: Vital signs in last 24 hours: Patient Vitals for the past 24 hrs:  BP Temp Temp src Pulse Resp SpO2  02/03/24 0758 117/69 98 F (36.7 C) Oral 80 18 99 %  02/03/24 0452 -- 98.5 F (36.9 C) -- -- -- --  02/03/24 0400 (!) 122/59 -- -- 88 (!) 29 99 %  02/03/24 0004 -- 98.9 F (37.2 C) -- -- -- --  02/03/24 0000 122/65 -- -- 87 (!) 9 98 %  02/02/24 2200 -- -- -- 86 (!) 21 99 %  02/02/24 2000 109/61 98.9 F (37.2 C) -- 64 19 98 %  02/02/24 1800 -- -- -- 86 (!) 23 99 %  02/02/24 1158 (!) 107/53 98.3 F (36.8 C) -- 87 18 99 %  02/02/24 0829 110/73 98.3 F (36.8 C) -- 87 20 99 %       PHYSICAL EXAM:  General: somnolent Answers questions  lungs: Bilateral air entry.  crepts Heart: Irregular Abdomen: Soft, non-tender,not  distended. Bowel sounds normal. No masses Extremities: Left knee swelling improved Edema legs Multiple wounds on both feet Skin: Venous stasis pigmentation extremities   lymph: Cervical, supraclavicular normal. Neurologic: Grossly non-focal  Lab Results    Latest Ref Rng & Units 02/03/2024    3:51 AM 02/02/2024    4:00 AM 02/01/2024    4:55 AM  CBC  WBC 4.0 - 10.5 K/uL 11.2  9.4  9.4   Hemoglobin 13.0 - 17.0 g/dL 81.1  91.4  78.2   Hematocrit 39.0 - 52.0 % 35.9  35.4  33.9   Platelets 150 - 400 K/uL 234  216  173        Latest Ref Rng & Units 02/03/2024    3:51 AM 02/02/2024    4:00 AM 02/01/2024    4:55 AM  CMP  Glucose 70 - 99 mg/dL 956  213  086   BUN 8 - 23 mg/dL 67  74  86   Creatinine 0.61 - 1.24 mg/dL 5.78  4.69  6.29   Sodium 135 - 145 mmol/L 139  139  139   Potassium 3.5 - 5.1  mmol/L 3.8  3.8  3.5   Chloride 98 - 111 mmol/L 106  104  103   CO2 22 - 32 mmol/L 21  21  24    Calcium  8.9 - 10.3 mg/dL 8.9  8.8  9.0       Microbiology:  Studies/Results: No results found.    Assessment/Plan:  Group B streptococcus bacteremia 4 out of 4 high bioburden.  With pacemaker need to rule out endocarditis and wire vegetation.  Ideally would need pacemaker removal but as per EPI/cardiology  he is not a candidate currently 2D echo has not shown valve vegetations so he will need TEE.once he is stable Reepat blood culture neg so far Will avoid PICC line placement until the bacteremia has cleared and also once we know the status of his heart valves.  Patient was on IV ceftriaxone  and changed to vanco by primary team to cover MRSA, enterococcus in wounds which likely are colonizing and would not treat now because of CKD avoid vanco Will continue with ceftriaxone  and DC vanco   CHF followed by cardiology NSTEMI  A-fib  Bilateral wounds on his feet Culture shows rare group B streptococcus, rare Staph aureus, rare Enterococcus No addition of antibiotics currently.  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  Status post aspiration.  Culture NG   Discussed the management with the patient and care team.

## 2024-02-03 NOTE — Inpatient Diabetes Management (Addendum)
 Inpatient Diabetes Program Recommendations  AACE/ADA: New Consensus Statement on Inpatient Glycemic Control   Target Ranges:  Prepandial:   less than 140 mg/dL      Peak postprandial:   less than 180 mg/dL (1-2 hours)      Critically ill patients:  140 - 180 mg/dL    Latest Reference Range & Units 02/02/24 08:27 02/02/24 11:59 02/02/24 16:55 02/02/24 20:47 02/03/24 07:54  Glucose-Capillary 70 - 99 mg/dL 829 (H) 562 (H) 130 (H) 178 (H) 194 (H)    Review of Glycemic Control  Diabetes history: DM2 Outpatient Diabetes medications: Glipizide  10 mg BID, Metformin  1000 mg BID (not taking) Current orders for Inpatient glycemic control: Novolog  0-15 units TID with meals, Novolog  0-5 units QHS  Inpatient Diabetes Program Recommendations:    Insulin : Please consider ordering Novolog  2 units TID with meals for meal coverage if patient eats at least 50% of meals.  Thanks, Beacher Limerick, RN, MSN, CDCES Diabetes Coordinator Inpatient Diabetes Program [phone] (Team Pager from 8am to 5pm)

## 2024-02-04 ENCOUNTER — Inpatient Hospital Stay: Admit: 2024-02-04 | Discharge: 2024-02-04 | Disposition: A | Discharge status: Home / Self Care | Attending: Student

## 2024-02-04 ENCOUNTER — Inpatient Hospital Stay: Admitting: General Practice

## 2024-02-04 ENCOUNTER — Encounter: Admission: EM | Disposition: E | Payer: Self-pay | Source: Home / Self Care | Attending: Internal Medicine

## 2024-02-04 DIAGNOSIS — B951 Streptococcus, group B, as the cause of diseases classified elsewhere: Secondary | ICD-10-CM | POA: Diagnosis not present

## 2024-02-04 DIAGNOSIS — B952 Enterococcus as the cause of diseases classified elsewhere: Secondary | ICD-10-CM

## 2024-02-04 DIAGNOSIS — S91302A Unspecified open wound, left foot, initial encounter: Secondary | ICD-10-CM

## 2024-02-04 DIAGNOSIS — I871 Compression of vein: Secondary | ICD-10-CM

## 2024-02-04 DIAGNOSIS — R7881 Bacteremia: Secondary | ICD-10-CM | POA: Diagnosis not present

## 2024-02-04 DIAGNOSIS — N179 Acute kidney failure, unspecified: Secondary | ICD-10-CM | POA: Diagnosis not present

## 2024-02-04 DIAGNOSIS — S91301A Unspecified open wound, right foot, initial encounter: Secondary | ICD-10-CM

## 2024-02-04 DIAGNOSIS — J9611 Chronic respiratory failure with hypoxia: Secondary | ICD-10-CM | POA: Diagnosis not present

## 2024-02-04 DIAGNOSIS — I214 Non-ST elevation (NSTEMI) myocardial infarction: Secondary | ICD-10-CM | POA: Diagnosis not present

## 2024-02-04 DIAGNOSIS — L819 Disorder of pigmentation, unspecified: Secondary | ICD-10-CM

## 2024-02-04 DIAGNOSIS — A401 Sepsis due to streptococcus, group B: Secondary | ICD-10-CM | POA: Diagnosis not present

## 2024-02-04 DIAGNOSIS — I5023 Acute on chronic systolic (congestive) heart failure: Secondary | ICD-10-CM | POA: Diagnosis not present

## 2024-02-04 HISTORY — PX: TEE WITHOUT CARDIOVERSION: SHX5443

## 2024-02-04 LAB — CBC
HCT: 37.7 % — ABNORMAL LOW (ref 39.0–52.0)
Hemoglobin: 11.5 g/dL — ABNORMAL LOW (ref 13.0–17.0)
MCH: 26.4 pg (ref 26.0–34.0)
MCHC: 30.5 g/dL (ref 30.0–36.0)
MCV: 86.7 fL (ref 80.0–100.0)
Platelets: 265 10*3/uL (ref 150–400)
RBC: 4.35 MIL/uL (ref 4.22–5.81)
RDW: 16 % — ABNORMAL HIGH (ref 11.5–15.5)
WBC: 11.1 10*3/uL — ABNORMAL HIGH (ref 4.0–10.5)
nRBC: 0 % (ref 0.0–0.2)

## 2024-02-04 LAB — BODY FLUID CULTURE W GRAM STAIN: Culture: NO GROWTH

## 2024-02-04 LAB — BASIC METABOLIC PANEL WITH GFR
Anion gap: 12 (ref 5–15)
BUN: 63 mg/dL — ABNORMAL HIGH (ref 8–23)
CO2: 21 mmol/L — ABNORMAL LOW (ref 22–32)
Calcium: 9.3 mg/dL (ref 8.9–10.3)
Chloride: 108 mmol/L (ref 98–111)
Creatinine, Ser: 1.34 mg/dL — ABNORMAL HIGH (ref 0.61–1.24)
GFR, Estimated: 54 mL/min — ABNORMAL LOW (ref >60)
Glucose, Bld: 197 mg/dL — ABNORMAL HIGH (ref 70–99)
Potassium: 3.8 mmol/L (ref 3.5–5.1)
Sodium: 141 mmol/L (ref 135–145)

## 2024-02-04 LAB — GLUCOSE, CAPILLARY
Glucose-Capillary: 193 mg/dL — ABNORMAL HIGH (ref 70–99)
Glucose-Capillary: 262 mg/dL — ABNORMAL HIGH (ref 70–99)
Glucose-Capillary: 211 mg/dL — ABNORMAL HIGH (ref 70–99)

## 2024-02-04 LAB — HEPARIN LEVEL (UNFRACTIONATED): Heparin Unfractionated: 0.48 [IU]/mL (ref 0.30–0.70)

## 2024-02-04 SURGERY — ECHOCARDIOGRAM, TRANSESOPHAGEAL
Anesthesia: General

## 2024-02-04 MED ORDER — PROPOFOL 10 MG/ML IV BOLUS
INTRAVENOUS | Status: DC | PRN
  Administered 2024-02-04 (×2): 20 mg via INTRAVENOUS
  Administered 2024-02-04: 30 mg via INTRAVENOUS

## 2024-02-04 MED ORDER — BUTAMBEN-TETRACAINE-BENZOCAINE 2-2-14 % EX AERO
INHALATION_SPRAY | CUTANEOUS | Status: AC
  Filled 2024-02-04: qty 5

## 2024-02-04 MED ORDER — METOPROLOL SUCCINATE ER 25 MG PO TB24
25.0000 mg | ORAL_TABLET | Freq: Every day | ORAL | Status: DC
  Administered 2024-02-04 – 2024-02-06 (×3): 25 mg via ORAL
  Filled 2024-02-04 (×3): qty 1

## 2024-02-04 MED ORDER — LIDOCAINE VISCOUS HCL 2 % MT SOLN
OROMUCOSAL | Status: AC
Start: 2024-02-04 — End: ?
  Filled 2024-02-04: qty 15

## 2024-02-04 MED ORDER — SODIUM CHLORIDE 0.9 % IV SOLN: INTRAVENOUS | Status: DC

## 2024-02-04 MED ORDER — INSULIN GLARGINE-YFGN 100 UNIT/ML ~~LOC~~ SOLN
5.0000 [IU] | Freq: Every day | SUBCUTANEOUS | Status: DC
  Administered 2024-02-04 – 2024-02-06 (×3): 5 [IU] via SUBCUTANEOUS
  Filled 2024-02-04 (×3): qty 0.05

## 2024-02-04 NOTE — Progress Notes (Signed)
 ANTICOAGULATION CONSULT NOTE  Pharmacy Consult for heparin  infusion Indication: Atrial fibrillation  Allergies  Allergen Reactions   Quinolones Other (See Comments)    Patient has aortic aneurysm - fluoroquinolones are contraindicated due to risk of aortic rupture.   Eliquis  [Apixaban ]     Dizziness  and vision change   Spironolactone  Other (See Comments)    dizziness    Patient Measurements: Height: 6\' 6"  (198.1 cm) Weight: 108 kg (238 lb 1.6 oz) IBW/kg (Calculated) : 91.4 Heparin  Dosing Weight: 115.5 kg  Vital Signs: Temp: 97.7 F (36.5 C) (05/13 0320) Temp Source: Oral (05/13 0320) BP: 118/67 (05/13 0320) Pulse Rate: 94 (05/13 0320)  Labs: Recent Labs    02/02/24 0400 02/03/24 0351 02/04/24 0618  HGB 11.1* 11.2* 11.5*  HCT 35.4* 35.9* 37.7*  PLT 216 234 265  HEPARINUNFRC 0.38 0.35 0.48  CREATININE 1.41* 1.46* 1.34*    Estimated Creatinine Clearance: 57.8 mL/min (A) (by C-G formula based on SCr of 1.34 mg/dL (H)).   Medical History: Past Medical History:  Diagnosis Date   (HFpEF) heart failure with preserved ejection fraction (HCC) 03/01/2020   a.) TTE 03/01/2020: EF 55-60%, mod MAC, mod AoV sclerosis, triv AR, mild TR, mod MR, RVSP 50-59; b.) TTE 09/10/2020: EF 55-60%, mod MAC, mod AoV sclerosis, mild TR, 3+ MR, RVSP 50-59; c.) TTE 09/26/2020: EF 55-60%, mild LA dil, triv PR, mild MR/TR, RVSP 37-49   Adenoma of left adrenal gland    Anemia    Arthritis    Atrial fibrillation and flutter (HCC)    a.) CHA2DS2VASc = 5 (age x2, HFpEF, HTN, T2DM);  b.) s/p CTI ablation 09/07/2020; c.) rate/rhythm maintained on oral carvedilol ; not on chronic anticoagulation therapy   CAD (coronary artery disease)    Cardiomegaly    CKD (chronic kidney disease), stage III (HCC)    DOE (dyspnea on exertion)    Drug-induced bradycardia    Gangrene of toe of left foot (HCC)    a.) 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   H/O active rheumatic fever 08/22/2020    Hepatosplenomegaly    History of bilateral cataract extraction    HLD (hyperlipidemia)    Hypertension    Long term current use of aspirin     Lymphedema of both lower extremities    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)    a.) s/p amputation 11/04/2022   Peripheral vascular disease (HCC)    Pleural effusion on right 09/09/2020   a.) s/p RIGHT thoracentesis with 2180 cc yield   Pneumonia    Presence of permanent cardiac pacemaker 09/10/2020   a.) TVP placement 09/10/2020 due to intermittent CHB in setting of urosepsis; b.) s/p PPM placement 09/15/2020: MDT Azure XT DR (SN: [id number])   Pulmonary hypertension (HCC) 03/01/2020   a.) TTE: 03/01/2020: RVSP 50-59; b.) TTE 09/10/2020: RVSP 50-59; c.) TTE 09/26/2020: RVSP 37-49   RA (rheumatoid arthritis) (HCC)    Rheumatic fever    Sepsis (HCC) 09/10/2020   a.) urosepsis --> BC x 2 sets and UC all grew out significant Proteus mirabilis; admitted to [facility] 09/07/2020 - 09/27/2020.   Sick sinus syndrome [facility])    a.) s/p MDT PPM placement 09/15/2020   T2DM (type 2 diabetes mellitus) (HCC)    Urinary retention    chronic, with indwelling Foley catheter and plans for a suprapubic   Wears dentures    full upper    Assessment: Pt is a 78 yo male presenting to ED complaining of  feeling of dizziness and confusion. Peak trop of 1.8K. No DOAC PTA. PMH: HFpEF, OA, afib/flutter, pacemaker, CAD, CKD 3, HLD, HTN, PAD, DM. Of note, patient has previously refused oral anticoagulants.    5/5 0844 HL 0.27    SUBtherapeutic 5/5 1826 HL 0.39    Therapeutic x 1 5/6 0401 HL 0.20    SUBtherapeutic  5/6 1340 HL 0.24    SUBtherapeutic 5/6 2256 HL 0.41    Therapeutic x 1  5/7 0701 HL 0.45    Therapeutic x 2 5/8 0248 HL 0.44    Therapeutic x 3  5/9 0743 HL 0.51    Therapeutic x 4 5/10 0455 HL 0.37  Therapeutic X 5 5/11 0400 HL 0.38  Therapeutic x 6 5/12 0351 HL 0.35  Therapeutic x 7 5/13 0618 HL 0.48  Therapeutic x 8  Goal of  Therapy:  Heparin  level 0.3-0.7 units/ml Monitor platelets by anticoagulation protocol: Yes   Plan:  5/13 @ 0618 = HL 0.48  Therapeutic x 8 - will continue pt on current rate and recheck HL on 5/13 with AM labs - CBC daily  Coretta Dexter, PharmD, [facility] 02/04/2024 7:08 AM

## 2024-02-04 NOTE — Progress Notes (Signed)
 PROGRESS NOTE    Tyler [name]  ZOX:[id number] DOB: May 01, 1946 DOA: 01/26/2024 PCP: McClanahan, Kyra, NP  Subjective: Pt seen and examined. Met with pt's dtr Carmel at bedside TEE today negative for endocarditis.  Pt is as awake he has been the last several days.   Hospital Course: Taken from H&P.  Tyler [name] is a 78 y.o. 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  He was also experiencing 5/10, nonradiating dull chest pain with no other associated symptoms.  On presentation patient has mild tachycardia.  Labs with BUN of 41, creatinine 1.5, baseline seems to be around 1.3, CO2 of 17, troponin 69>.105 EKG shows A-fib with RVR, PVCs and LBBB. CT head was negative for any acute intracranial abnormality CXR with cardiomegaly and mild central congestion.  Patient was initially started on heparin  infusion and cardiology was consulted.  5/5: Vital stable.  Worsening leukocytosis at 21.9, platelet at 143, none anion gap metabolic acid with worsening of bicarb to 16, BNP significantly elevated at 4043. Troponin continued to increase, currently at 714. Cardiology started him on IV Bumex .  Echocardiogram ordered  5/6: Rapid response was called overnight when patient became very tachycardic and tachypneic, found to be febrile at 102.9.  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 Blood cultures started growing group B streptococcus this morning-antibiotics are being switched to ceftriaxone .  ID was consulted.  Patient also has a pacemaker in place. Rising troponin, maximum recorded at 1830, improving leukocytosis, potassium 3.3 and worsening creatinine at 2.05 so further IV Bumex  was held this morning.  HPI: Tyler [name] is a 78 y.o. Caucasian male with medical history significant for HFpEF, osteoarthritis, atrial fibrillation and flutter, coronary artery disease, stage III chronic kidney disease, dyslipidemia and hypertension, as well as type 2 diabetes mellitus, who presented to the emergency room with a cancer of dizziness, lightheadedness and confusion that started around 4 PM today when he came back home from work.  This was associated with chest pain and dyspnea.  He describes his chest pain as dull aching graded 5/10 in severity with no radiation, nausea or vomiting or diaphoresis or palpitations.  His symptoms later resolved spontaneously.    He feels tactile fever without chills.  No cough or wheezing or vomitus.  No leg pain or edema recent travel or surgery.  No dysuria, oliguria or hematuria or flank pain.  No bleeding diathesis.   The patient was started on IV heparin  per ACS protocol. He will be admitted to a cardiac telemetry observation bed for further evaluation and management.   Significant Events: Admitted 01/26/2024 for chest pain 01-27-2024 blood cx positive for Group B Strep agalactiae  Significant Labs: Na 137, k 4.2, CO2 of 17, BUN 41, Scr 1.5, glu 189 Ca 9.3, TP 71. Alb 3.8 WBC 18.2, HgB 11.5, plt 164 Tropoin I 69-->105-->326 A1c 6.9% BNP 4043 01-27-2024 Blood cx Group B strep agalactiae  Significant Imaging Studies: CT head No acute intracranial process. 2. Mild diffuse atrophy  and mild chronic small vessel ischemic changes CXR Cardiomegaly with mild central congestion  CTPA Negative for acute pulmonary embolism. 2. Findings  suggestive of CHF with mild pulmonary edema. Small right pleural effusion. 3. 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 Recommend annual imaging followup by CTA or MRA. 01-28-2024 echo LVEF 25% 02-04-2024 TEE negative for vegetations  Antibiotic Therapy: Anti-infectives (From admission, onward)    Start     Dose/Rate Route Frequency Ordered Stop   01/28/24 1600  vancomycin  (VANCOREADY) IVPB 2000 mg/400 mL  Status:  Discontinued        2,000 mg 200 mL/hr over 120 Minutes Intravenous Every 24 hours 01/27/24 2009 01/28/24 0844   01/28/24 1600  cefTRIAXone  (ROCEPHIN ) 2 g in sodium chloride  0.9 % 100 mL IVPB        2 g 200 mL/hr over 30 Minutes Intravenous Daily 01/28/24 0844     01/28/24 0800  ceFEPIme  (MAXIPIME ) 2 g in sodium chloride  0.9 % 100 mL IVPB  Status:  Discontinued        2 g 200 mL/hr over 30 Minutes Intravenous Every 12 hours 01/27/24 1950 01/28/24 0844   01/27/24 2030  ceFEPIme  (MAXIPIME ) 2 g in sodium chloride  0.9 % 100 mL IVPB        2 g 200 mL/hr over 30 Minutes Intravenous  Once 01/27/24 1942 01/27/24 2242   01/27/24 2030  metroNIDAZOLE  (FLAGYL ) IVPB 500 mg  Status:  Discontinued        500 mg 100 mL/hr over 60 Minutes Intravenous Every 12 hours 01/27/24 1942 01/28/24 0844   01/27/24 2030  vancomycin  (VANCOCIN ) IVPB 1000 mg/200 mL premix  Status:  Discontinued        1,000 mg 200 mL/hr over 60 Minutes Intravenous  Once 01/27/24 1942 01/27/24 1948   01/27/24 1945  vancomycin  (VANCOREADY) IVPB 2000 mg/400 mL        2,000 mg 200 mL/hr over 120 Minutes Intravenous  Once 01/27/24 1948 01/27/24 2304   01/26/24 2300  doxycycline  (VIBRA -TABS) tablet 100 mg  Status:  Discontinued        100 mg Oral 2 times daily 01/26/24 2254 01/26/24 2346       Procedures: 02-04-2024 TEE negative for vegetations  Consultants: Cardiology CHF EP/Cards ID    Assessment and Plan: * NSTEMI (non-ST elevated myocardial infarction) (HCC) On admission through 01-28-2024 Concern of NSTEMI,  type I versus type II Increasing troponin, currently at 714.  Continue to have some chest discomfort. Cardiology is on board -Continue with heparin  infusion - Likely will get ischemic workup after diuresing - Continue with aspirin , Plavix  and atorvastatin   01-29-2024 remains on IV heparin  gtts. No word yet from cards on ischemic eval.  02-01-2024 remains on IV heparin . Ischemic workup at discretion of cards service.  01-30-2024 remains on IV heparin  gtts  02-01-2024 remains on IV heparin  gtts at discretion of cardiology service.  02-02-2024 remains on IV heparin  gtts at discretion of cardiology service. No decision yet made in regards to ischemic evaluation.  02-03-2024 remains on IV heparin  gtts at discretion of cardiology service. No decision yet made in regards to ischemic evaluation.  02-04-2024 pt with negative TEE for endocarditis. He remains on IV heparin . Cards to decide on ischemic eval.  Septicemia due to group B Streptococcus (HCC) 01-29-2024 ID consulted. They want TEE to be performed due to high index of suspicion for endocarditis +/- PPM infection.  Cardiology deferring TEE until next week due to respiratory status.  01-30-2024 concern for endocarditis  of valve or PPM leads.  01-31-2024 TEE canceled today by cards due to altered mental status. They will re-evaluate next week. Cards has stated pt is not a candidate for device extraction. Continue with IV ABX under the direction of ID service. Currently on IV rocephin  2 gram daily.  02-01-2024 pt awaiting TEE. Maybe next week. Remains on IV rocephin  2 grams daily.  02-02-2024 yesterday pt's wound cultures from foot growing enterococcus and MRSA. IV ABX changed to IV vanco which will cover group B strep in blood and MRSA/enterococcus from foot wound. NPO after MN for potential TEE in AM.  02-03-2024 Cards unable to get pt onto TEE schedule for today but will attempt to get TEE tomorrow.  On IV vanco.  02-04-2024 TEE  negative for vegetations.  ID changed him back over to IV Rocephin .   Acute kidney injury superimposed on stage 3a chronic kidney disease (HCC) - baseline scr 1.3 On admission through 01-28-2024. Worsening creatinine, AKI on chronic CKD stage IIIa. Worsening non-anion gap metabolic acidosis, multifactorial with AKI, CHF and also concern of sepsis. IV Bumex  was held by cardiology today -Giving gentle IV fluid with bicarb infusion -Monitor renal function -Avoid nephrotoxins  01-29-2024 worsening Scr today. Scr up to 2.33 today. Given his low EF I will avoid IVF for now. Cards gave him 160 mg of IV lasix  today.  Defer to cardiology to manage his AKI/CKD if they are continuing his diuresis.  01-30-2024 Scr 2.44 today.  Received 160 mg IV lasix . 2.3 liters in urine out yesterday. Pt states breathing is better. Defer to CHF team for further diuresis.  01-31-2024 scr 2.13, BUN 98 today.  has 4.7 liters in urine yesterday. Diuresis management by cardiology team.  Appears further IV diuretics on hold by cardiology team.  02-01-2024 3.5 liters in urine output. Did not receive any diuretics yesterday. Monitor renal function.  02-02-2024 Scr 1.41, BUN 74 today.  Urine output dropped off yesterday. Only 1.2 liters in urine today. Appears from cardiology note today that bumex  is going to be started. Monitor renal function.  02-02-2024 Scr 1.46, BUN 67 today. Pt started yesterday on 2 mg bumex  daily. 2.3 liter in urine output yesterday.  02-04-2024 Scr 1.34, BUN 63 today.  On bumex  2 mg daily per cardiology. 2 liters in urine output yesterday.    Acute on chronic systolic CHF (congestive heart failure) (HCC) On admission through 01-28-2024 Patient with volume overload, positive JVD and elevated BNP at 4043 Echo cardiogram ordered-pending -Cardiology started him on IV Bumex  2 mg twice daily which was held today due to worsening creatinine -Advanced heart failure team consulted -Continue with  metolazone  -Daily weight and BMP -Strict intake and output  01-29-2024 will let cards and CHF team manage pt's volume status with diuretics, etc.  01-30-2024 management per CHF team. Yesterday echo LVEF 25%  01-31-2024 has 4.7 liters in urine yesterday. Diuresis management by cardiology team. Appears further IV diuretics on hold by cardiology team.  02-01-2024 3.5 liters in urine output. Did not receive any diuretics yesterday. Monitor renal function.   02-02-2024 Scr 1.41, BUN 74 today. Urine output dropped off yesterday. Only 1.2 liters in urine today. Appears from cardiology note today that bumex  is going to be started. Monitor renal function.   02-03-2024 Scr 1.46, BUN 67 today. Pt started yesterday on 2 mg bumex  daily. 2.3 liter in urine output yesterday.   02-04-2024 Scr 1.34, BUN 63 today.  On bumex  2 mg daily per cardiology. 2 liters  in urine output yesterday.  Sepsis due to cellulitis [facility]) On admission through 01-28-2024 Patient met sepsis criteria after developing high-grade fever with tachycardia overnight.  Likely due to lower extremity cellulitis and bilateral multiple foot wounds. Received broad-spectrum antibiotics overnight Preliminary blood cultures with group B strep agalactia - De-escalate antibiotics to ceftriaxone  - ID was consulted - Echocardiogram pending  01-29-2024 ID consulting. Concern for potential endocarditis +/- PPM infection.   02-01-2024 pt awaiting TEE. Maybe next week. Remains on IV rocephin  2 grams daily. 01-31-2024 TEE canceled today by cards due to altered mental status. They will re-evaluate next week. Cards has stated pt is not a candidate for device extraction. Continue with IV ABX under the direction of ID service. Currently on IV rocephin  2 gram daily.  01-30-2024 potential for TEE tomorrow. Decision will be made tomorrow AM. NPO after MN in case TEE can be performed tomorrow. Remains on IV ABX.   02-02-2024 yesterday pt's wound cultures  from foot growing enterococcus and MRSA. IV ABX changed to IV vanco which will cover group B strep in blood and MRSA/enterococcus from foot wound  02-03-2024 Cards unable to get pt onto TEE schedule for today but will attempt to get TEE tomorrow.  On IV vanco.  02-04-2024 TEE negative for vegetations.  ID changed him back over to IV Rocephin .  Pseudogout of left knee 01-31-2024 had left knee arthrocentesis by ortho yesterday. Fluid analysis shows pseudogout.  Color, Synovial YELLOW  Appearance-Synovial CLOUDY Abnormal   Crystals, Fluid EXTRACELLULAR CALCIUM  PYROPHOSPHATE CRYSTALS  Comment: INTRACELLULAR CALCIUM  PYROPHOSPHATE CRYSTALS  WBC, Synovial 24,791 High   Neutrophil, Synovial 93 High   Lymphocytes-Synovial Fld 1  Monocyte-Macrophage-Synovial Fluid 6 Low   Eosinophils-Synovial 0   02-02-2024 stable.  02-03-2024 stable.  Pressure injury of skin Agree with RN documentation regarding pressure injury to skin.  Pressure Injury 01/27/24 Heel Right Unstageable - Full thickness tissue loss in which the base of the injury is covered by slough (yellow, tan, gray, green or brown) and/or eschar (tan, brown or black) in the wound bed. (Active)  01/27/24 2000  Location: Heel  Location Orientation: Right  Staging: Unstageable - Full thickness tissue loss in which the base of the injury is covered by slough (yellow, tan, gray, green or brown) and/or eschar (tan, brown or black) in the wound bed.  Wound Description (Comments):   Present on Admission: Yes     Pressure Injury 01/27/24 Heel Right Stage 2 -  Partial thickness loss of dermis presenting as a shallow open injury with a red, pink wound bed without slough. (Active)  01/27/24 2000  Location: Heel  Location Orientation: Right  Staging: Stage 2 -  Partial thickness loss of dermis presenting as a shallow open injury with a red, pink wound bed without slough.  Wound Description (Comments):   Present on Admission: Yes     Pressure  Injury 01/27/24 Heel Left Stage 2 -  Partial thickness loss of dermis presenting as a shallow open injury with a red, pink wound bed without slough. (Active)  01/27/24 2000  Location: Heel  Location Orientation: Left  Staging: Stage 2 -  Partial thickness loss of dermis presenting as a shallow open injury with a red, pink wound bed without slough.  Wound Description (Comments):   Present on Admission: Yes      Essential hypertension On admission through 01-28-2024 Blood pressure within goal, being maintained with diuretics. Not on any other antihypertensives at home. -Continue to monitor  01-30-2024 stable. HTN meds  on hold due to borderline low BP  01-31-2024 stable. HTN meds still on hold due to borderline low BP.  05-10-205 stable.  02-02-2024 stable  02-03-2024 stable.  02-04-2024 cardiology has started Toprol -XL 25 mg daily.  Dyslipidemia On admission through 01-28-2024. Continue atorvastatin   01-30-2024 stable.  01-31-2024 stable  05-10-205 stable.  02-02-2024 stable  02-03-2024 stable.  02-04-2024 stable  Type II diabetes mellitus with renal manifestations (HCC) On admission through 01-28-2024 Will place the patient on supplement coverage with NovoLog . - Holding home glipizide  and metformin .  01-29-2024 stable.  01-30-2024 stable.  01-31-2024 stable  02-01-2024 stable.  02-02-2024 stable. Acceptable CBG ranges.  02-03-2024 stable.  02-04-2024 CBG rising. Pt eating more. Add lantus  5 units at bedtime. Continue SSI.  DVT prophylaxis: Place TED hose Start: 01/29/24 0753    Code Status: Limited: Do not attempt resuscitation (DNR) -DNR-LIMITED -Do Not Intubate/DNI  Family Communication: discussed with pt's dtr Carmel at bedside Disposition Plan: unknown. Probably SNF Reason for continuing need for hospitalization: remains on IV heparin  and IV ABX  Objective: Vitals:   02/04/24 1245 02/04/24 1305 02/04/24 1315 02/04/24 1353  BP: (!) 80/52 108/65 (!)  120/107 110/71  Pulse: 99 96 92 93  Resp: (!) 28  (!) 31   Temp:      TempSrc:      SpO2: 95% 94% 97%   Weight:      Height:        Intake/Output Summary (Last 24 hours) at 02/04/2024 1444 Last data filed at 02/04/2024 0320 Gross per 24 hour  Intake 1892.68 ml  Output 1475 ml  Net 417.68 ml   Filed Weights   01/31/24 0508 02/01/24 0500 02/04/24 0500  Weight: 103 kg 100.7 kg 108 kg    Examination:  Physical Exam Vitals and nursing note reviewed.  Constitutional:      General: He is not in acute distress.    Appearance: He is not toxic-appearing.     Comments: Chronically ill appearing. Arousable. About the same mentation the last 4 days. Dtr carmel at bedside.  HENT:     Head: Normocephalic and atraumatic.  Cardiovascular:     Rate and Rhythm: Normal rate and regular rhythm.     Comments: Paced V-rhythm on bedside telemetry Pulmonary:     Effort: Pulmonary effort is normal.     Breath sounds: Normal breath sounds.  Abdominal:     General: Bowel sounds are normal. There is no distension.     Palpations: Abdomen is soft.  Skin:    General: Skin is warm and dry.     Capillary Refill: Capillary refill takes less than 2 seconds.  Neurological:     Comments: Awake and mostly oriented.    Data Reviewed: I have personally reviewed following labs and imaging studies  CBC: Recent Labs  Lab 01/31/24 0612 02/01/24 0455 02/02/24 0400 02/03/24 0351 02/04/24 0618  WBC 8.3 9.4 9.4 11.2* 11.1*  HGB 9.6* 10.6* 11.1* 11.2* 11.5*  HCT 30.7* 33.9* 35.4* 35.9* 37.7*  MCV 87.2 85.8 86.3 86.9 86.7  PLT 152 173 216 234 265   Basic Metabolic Panel: Recent Labs  Lab 01/30/24 0248 01/31/24 0612 02/01/24 0455 02/02/24 0400 02/03/24 0351 02/04/24 0618  NA 136 133* 139 139 139 141  K 3.3* 3.4* 3.5 3.8 3.8 3.8  CL 102 98 103 104 106 108  CO2 22 23 24  21* 21* 21*  GLUCOSE 176* 189* 165* 171* 189* 197*  BUN 98* 98* 86* 74* 67* 63*  CREATININE 2.44* 2.13* 1.53* 1.41* 1.46*  1.34*  CALCIUM  8.5* 8.4* 9.0 8.8* 8.9 9.3  MG 2.4  --   --   --  2.5*  --    GFR: Estimated Creatinine Clearance: 57.8 mL/min (A) (by C-G formula based on SCr of 1.34 mg/dL (H)).  Recent Labs  Lab 01/31/24 0956  AMMONIA 19   BNP (last 3 results) Recent Labs    09/11/23 1627 10/28/23 1107 01/27/24 0844  BNP 3,874.2* 754.7* 4,043.5*   CBG: Recent Labs  Lab 02/03/24 0754 02/03/24 1152 02/03/24 1458 02/03/24 2035 02/04/24 0809  GLUCAP 194* 220* 213* 216* 193*   Recent Results (from the past 240 hours)  Culture, blood (x 2)     Status: Abnormal   Collection Time: 01/27/24  8:43 PM   Specimen: BLOOD  Result Value Ref Range Status   Specimen Description   Final    BLOOD BLOOD LEFT ARM LAC Performed at [facility], [address] [place], [place] [address]    Special Requests   Final    BOTTLES DRAWN AEROBIC AND ANAEROBIC Blood Culture adequate volume Performed at [facility], [address] [place], [place] [address]    Culture  Setup Time   Final    GRAM POSITIVE COCCI IN BOTH AEROBIC AND ANAEROBIC BOTTLES CRITICAL RESULT CALLED TO, READ BACK BY AND VERIFIED WITH: TREY GREENWOOD 01/28/24 0811 MW GRAM STAIN REVIEWED-AGREE WITH RESULT DRT    Culture (A)  Final    GROUP B STREP(S.AGALACTIAE)ISOLATED SUSCEPTIBILITIES PERFORMED ON PREVIOUS CULTURE WITHIN THE LAST 5 DAYS. Performed at [facility], [address] [place], [place] [address]    Report Status 01/30/2024 FINAL  Final  Culture, blood (x 2)     Status: Abnormal   Collection Time: 01/27/24  8:49 PM   Specimen: BLOOD  Result Value Ref Range Status   Specimen Description   Final    BLOOD BLOOD LEFT HAND [facility] Performed at [facility], [address], [place], [place] [address]    Special Requests   Final    BOTTLES DRAWN AEROBIC AND ANAEROBIC Blood Culture adequate volume Performed at [facility], [address] [place], [place] [address]    Culture   Setup Time   Final    GRAM POSITIVE COCCI IN BOTH AEROBIC AND ANAEROBIC BOTTLES Organism ID to follow CRITICAL RESULT CALLED TO, READ BACK BY AND VERIFIED WITH: Kathyanne Parkers GREENWOOD 01/28/24 0811 MW GRAM STAIN REVIEWED-AGREE WITH RESULT DRT Performed at [facility], [address] [place], [place] [address]    Culture GROUP B STREP(S.AGALACTIAE)ISOLATED (A)  Final   Report Status 01/30/2024 FINAL  Final   Organism ID, Bacteria GROUP B STREP(S.AGALACTIAE)ISOLATED  Final      Susceptibility   Group b strep(s.agalactiae)isolated - MIC*    CLINDAMYCIN RESISTANT Resistant     AMPICILLIN <=0.25 SENSITIVE Sensitive     ERYTHROMYCIN >=8 RESISTANT Resistant     VANCOMYCIN  0.5 SENSITIVE Sensitive     CEFTRIAXONE  <=0.12 SENSITIVE Sensitive     LEVOFLOXACIN  0.5 SENSITIVE Sensitive     PENICILLIN <=0.06 SENSITIVE Sensitive     * GROUP B STREP(S.AGALACTIAE)ISOLATED  Blood Culture ID Panel (Reflexed)     Status: Abnormal   Collection Time: 01/27/24  8:49 PM  Result Value Ref Range Status   Enterococcus faecalis NOT DETECTED NOT DETECTED Final   Enterococcus Faecium NOT DETECTED NOT DETECTED Final   Listeria monocytogenes NOT DETECTED NOT DETECTED Final   Staphylococcus  species NOT DETECTED NOT DETECTED Final   Staphylococcus aureus (BCID) NOT DETECTED NOT DETECTED Final   Staphylococcus epidermidis NOT DETECTED NOT DETECTED Final   Staphylococcus lugdunensis NOT DETECTED NOT DETECTED Final   Streptococcus species DETECTED (A) NOT DETECTED Final    Comment: CRITICAL RESULT CALLED TO, READ BACK BY AND VERIFIED WITH: TREY GREENWOOD 01/28/24 0811 MW    Streptococcus agalactiae DETECTED (A) NOT DETECTED Final    Comment: CRITICAL RESULT CALLED TO, READ BACK BY AND VERIFIED WITH: TREY GREENWOOD 01/28/24 0811 MW    Streptococcus pneumoniae NOT DETECTED NOT DETECTED Final   Streptococcus pyogenes NOT DETECTED NOT DETECTED Final   A.calcoaceticus-baumannii NOT DETECTED NOT DETECTED Final   Bacteroides  fragilis NOT DETECTED NOT DETECTED Final   Enterobacterales NOT DETECTED NOT DETECTED Final   Enterobacter cloacae complex NOT DETECTED NOT DETECTED Final   Escherichia coli NOT DETECTED NOT DETECTED Final   Klebsiella aerogenes NOT DETECTED NOT DETECTED Final   Klebsiella oxytoca NOT DETECTED NOT DETECTED Final   Klebsiella pneumoniae NOT DETECTED NOT DETECTED Final   Proteus species NOT DETECTED NOT DETECTED Final   Salmonella species NOT DETECTED NOT DETECTED Final   Serratia marcescens NOT DETECTED NOT DETECTED Final   Haemophilus influenzae NOT DETECTED NOT DETECTED Final   Neisseria meningitidis NOT DETECTED NOT DETECTED Final   Pseudomonas aeruginosa NOT DETECTED NOT DETECTED Final   Stenotrophomonas maltophilia NOT DETECTED NOT DETECTED Final   Candida albicans NOT DETECTED NOT DETECTED Final   Candida auris NOT DETECTED NOT DETECTED Final   Candida glabrata NOT DETECTED NOT DETECTED Final   Candida krusei NOT DETECTED NOT DETECTED Final   Candida parapsilosis NOT DETECTED NOT DETECTED Final   Candida tropicalis NOT DETECTED NOT DETECTED Final   Cryptococcus neoformans/gattii NOT DETECTED NOT DETECTED Final    Comment: Performed at [facility], [address] [place], [place] [address]  MRSA Next Gen by PCR, Nasal     Status: None   Collection Time: 01/28/24  1:00 AM   Specimen: Nasal Mucosa; Nasal Swab  Result Value Ref Range Status   MRSA by PCR Next Gen NOT DETECTED NOT DETECTED Final    Comment: (NOTE) The GeneXpert MRSA Assay (FDA approved for NASAL specimens only), is one component of a comprehensive MRSA colonization surveillance program. It is not intended to diagnose MRSA infection nor to guide or monitor treatment for MRSA infections. Test performance is not FDA approved in patients less than 60 years old. Performed at [facility], [address] [place], [place] [address]   Aerobic Culture w Gram Stain (superficial specimen)     Status:  None   Collection Time: 01/29/24  6:35 AM   Specimen: Foot; Wound  Result Value Ref Range Status   Specimen Description   Final    FOOT RIGHT Performed at [facility], [address] [place], [place] [address]    Special Requests   Final    NONE Performed at [facility], [address] [place], [place] [address]    Gram Stain NO WBC SEEN RARE GRAM POSITIVE COCCI IN PAIRS   Final   Culture   Final    RARE STAPHYLOCOCCUS AUREUS SUSCEPTIBILITIES PERFORMED ON PREVIOUS CULTURE WITHIN THE LAST 5 DAYS. ABUNDANT CORYNEBACTERIUM STRIATUM Standardized susceptibility testing for this organism is not available. Performed at [facility], [address], [place], [place] [address]    Report Status 02/01/2024 FINAL  Final  Aerobic Culture w Gram Stain (superficial  specimen)     Status: None   Collection Time: 01/29/24  6:37 AM   Specimen: Foot; Wound  Result Value Ref Range Status   Specimen Description   Final    FOOT LEFT Performed at [facility], [address], [place], [place] [address]    Special Requests   Final    NONE Performed at [facility], [address] [place], [place] [address]    Gram Stain   Final    NO WBC SEEN RARE GRAM POSITIVE COCCI IN PAIRS Performed at [facility], [address], [place], [place] [address]    Culture   Final    RARE METHICILLIN RESISTANT STAPHYLOCOCCUS AUREUS RARE ENTEROCOCCUS FAECALIS RARE GROUP B STREP(S.AGALACTIAE)ISOLATED TESTING AGAINST S. AGALACTIAE NOT ROUTINELY PERFORMED DUE TO PREDICTABILITY OF AMP/PEN/VAN SUSCEPTIBILITY. RARE FUNGUS (MOLD) ISOLATED, PROBABLE CONTAMINANT/COLONIZER (SAPROPHYTE). CONTACT MICROBIOLOGY IF FURTHER IDENTIFICATION REQUIRED [phone].    Report Status 02/01/2024 FINAL  Final   Organism ID, Bacteria METHICILLIN RESISTANT STAPHYLOCOCCUS AUREUS  Final   Organism ID, Bacteria ENTEROCOCCUS FAECALIS  Final      Susceptibility   Enterococcus  faecalis - MIC*    AMPICILLIN <=2 SENSITIVE Sensitive     VANCOMYCIN  1 SENSITIVE Sensitive     GENTAMICIN  SYNERGY SENSITIVE Sensitive     * RARE ENTEROCOCCUS FAECALIS   Methicillin resistant staphylococcus aureus - MIC*    CIPROFLOXACIN  >=8 RESISTANT Resistant     ERYTHROMYCIN >=8 RESISTANT Resistant     GENTAMICIN  <=0.5 SENSITIVE Sensitive     OXACILLIN >=4 RESISTANT Resistant     TETRACYCLINE <=1 SENSITIVE Sensitive     VANCOMYCIN  1 SENSITIVE Sensitive     TRIMETH /SULFA  <=10 SENSITIVE Sensitive     CLINDAMYCIN <=0.25 SENSITIVE Sensitive     RIFAMPIN <=0.5 SENSITIVE Sensitive     Inducible Clindamycin NEGATIVE Sensitive     LINEZOLID 2 SENSITIVE Sensitive     * RARE METHICILLIN RESISTANT STAPHYLOCOCCUS AUREUS  Culture, blood (Routine X 2) w Reflex to ID Panel     Status: None   Collection Time: 01/29/24  6:11 PM   Specimen: BLOOD  Result Value Ref Range Status   Specimen Description BLOOD BLOOD RIGHT HAND  Final   Special Requests   Final    BOTTLES DRAWN AEROBIC AND ANAEROBIC Blood Culture adequate volume   Culture   Final    NO GROWTH 5 DAYS Performed at [facility], [address] [place], [place] [address]    Report Status 02/03/2024 FINAL  Final  Culture, blood (Routine X 2) w Reflex to ID Panel     Status: None   Collection Time: 01/29/24  7:42 PM   Specimen: BLOOD  Result Value Ref Range Status   Specimen Description BLOOD BLOOD RIGHT HAND  Final   Special Requests   Final    BOTTLES DRAWN AEROBIC AND ANAEROBIC Blood Culture adequate volume   Culture   Final    NO GROWTH 5 DAYS Performed at [facility], [address], [place], [place] [address]    Report Status 02/03/2024 FINAL  Final  Body fluid culture w Gram Stain     Status: None   Collection Time: 01/30/24  4:00 PM   Specimen: Synovium; Body Fluid  Result Value Ref Range Status   Specimen Description   Final    SYNOVIAL Performed at [facility], [address],  [place], [place] [address]    Special Requests   Final    L KNEE  Performed at [facility], [address] [place], [place] [address]    Gram Stain   Final    MODERATE WBC PRESENT, PREDOMINANTLY PMN NO ORGANISMS SEEN    Culture   Final    NO GROWTH 3 DAYS Performed at [facility], [address] [place], [place] [address]    Report Status 02/04/2024 FINAL  Final    Scheduled Meds:  vitamin C   500 mg Oral BID   aspirin   81 mg Oral Daily   atorvastatin   80 mg Oral q1800   bumetanide   2 mg Oral Daily   Chlorhexidine  Gluconate Cloth  6 each Topical Daily   feeding supplement (NEPRO CARB STEADY)  237 mL Oral BID BM   gentamicin  cream  1 Application Topical BID   insulin  aspart  0-15 Units Subcutaneous TID WC   insulin  aspart  0-5 Units Subcutaneous QHS   insulin  glargine-yfgn  5 Units Subcutaneous QHS   metoprolol  succinate  25 mg Oral Daily   multivitamin with minerals  1 tablet Oral Daily   silver  sulfADIAZINE    Topical Daily   Continuous Infusions:  sodium chloride      cefTRIAXone  (ROCEPHIN )  IV     heparin  2,300 Units/hr (02/04/24 1219)     LOS: 7 days   Time spent: 45 minutes  Unk Garb, DO  [facility]  02/04/2024, 2:44 PM

## 2024-02-04 NOTE — Progress Notes (Signed)
 [name]IN[facility] CARDIOLOGY PROGRESS NOTE       Patient ID: Tyler [name] MRN: [id number] DOB/AGE: November 30, 1945 78 y.o.  Admit date: 01/26/2024 Referring Physician Dr. Amalia Jung Primary Physician Jewell Mose, NP  Primary Cardiologist Dr. Bob Burn Reason for Consultation chest pain  HPI: Tyler [name] is a 78 y.o. male  with a past medical history of chronic HFrEF, persistent atrial fibrillation, mitral insufficiency, CHB s/p PPM 2021, hypertension, chronic kidney disease stage III who presented to the ED on 01/26/2024 for chest pain. Cardiology was consulted for further evaluation.   Interval history: -Patient seen and examined this morning. More conversant today. Daughter at bedside.  -Cr stable today.  -Remains on heparin  gtt.  -Blood cx positive with strep agalactiae, repeat NGTD.  -Plan for TEE today to rule out endocarditis, PPM lead vegetation.  Review of systems complete and found to be negative unless listed above    Past Medical History:  Diagnosis Date   (HFpEF) heart failure with preserved ejection fraction (HCC) 03/01/2020   a.) TTE 03/01/2020: EF 55-60%, mod MAC, mod AoV sclerosis, triv AR, mild TR, mod MR, RVSP 50-59; b.) TTE 09/10/2020: EF 55-60%, mod MAC, mod AoV sclerosis, mild TR, 3+ MR, RVSP 50-59; c.) TTE 09/26/2020: EF 55-60%, mild LA dil, triv PR, mild MR/TR, RVSP 37-49   Adenoma of left adrenal gland    Anemia    Arthritis    Atrial fibrillation and flutter (HCC)    a.) CHA2DS2VASc = 5 (age x2, HFpEF, HTN, T2DM);  b.) s/p CTI ablation 09/07/2020; c.) rate/rhythm maintained on oral carvedilol ; not on chronic anticoagulation therapy   CAD (coronary artery disease)    Cardiomegaly    CKD (chronic kidney disease), stage III (HCC)    DOE (dyspnea on exertion)    Drug-induced bradycardia    Gangrene of toe of left foot (HCC)    a.) 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   H/O active rheumatic fever 08/22/2020   Hepatosplenomegaly    History of bilateral  cataract extraction    HLD (hyperlipidemia)    Hypertension    Long term current use of aspirin     Lymphedema of both lower extremities    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)    a.) s/p amputation 11/04/2022   Peripheral vascular disease (HCC)    Pleural effusion on right 09/09/2020   a.) s/p RIGHT thoracentesis with 2180 cc yield   Pneumonia    Presence of permanent cardiac pacemaker 09/10/2020   a.) TVP placement 09/10/2020 due to intermittent CHB in setting of urosepsis; b.) s/p PPM placement 09/15/2020: MDT Azure XT DR (SN: [id number])   Pulmonary hypertension (HCC) 03/01/2020   a.) TTE: 03/01/2020: RVSP 50-59; b.) TTE 09/10/2020: RVSP 50-59; c.) TTE 09/26/2020: RVSP 37-49   RA (rheumatoid arthritis) (HCC)    Rheumatic fever    Sepsis (HCC) 09/10/2020   a.) urosepsis --> BC x 2 sets and UC all grew out significant Proteus mirabilis; admitted to [facility] 09/07/2020 - 09/27/2020.   Sick sinus syndrome [facility])    a.) s/p MDT PPM placement 09/15/2020   T2DM (type 2 diabetes mellitus) (HCC)    Urinary retention    chronic, with indwelling Foley catheter and plans for a suprapubic   Wears dentures    full upper    Past Surgical History:  Procedure Laterality Date   AMPUTATION TOE Right 11/04/2022   Procedure: AMPUTATION TOE;  Surgeon: Dot Gazella, [name];  [name];  Service: Podiatry;  Laterality: Right;   AMPUTATION TOE Right 09/15/2023   Procedure: AMPUTATION TOE;  Surgeon: Jennefer Moats, [name];  [name];  Service: Orthopedics/Podiatry;  Laterality: Right;  Right hallux amputation   BONE BIOPSY Right 04/05/2023   Procedure: BONE BIOPSY THIRD & FOURTH;  Surgeon: Dot Gazella, [name];  [name];  Service: Podiatry;  Laterality: Right;   CARDIAC ELECTROPHYSIOLOGY STUDY AND ABLATION N/A 09/07/2020   Procedure: CARDIAC EP STUDY AND ABLATION (CTI)   CATARACT EXTRACTION W/PHACO Right 01/16/2017   Procedure: CATARACT EXTRACTION PHACO  AND INTRAOCULAR LENS PLACEMENT (IOC)  Right Complicated;  Surgeon: Annell Kidney, MD;  Location: [facility];  Service: Ophthalmology;  Laterality: Right;  IVA Block Healon 5 malyugin vision blue Diabetic - oral meds   CATARACT EXTRACTION W/PHACO Left 10/23/2022   Procedure: CATARACT EXTRACTION PHACO AND INTRAOCULAR LENS PLACEMENT (IOC) LEFT DIABETIC;  Surgeon: Clair Crews, MD;  Location: [facility];  Service: Ophthalmology;  Laterality: Left;  Diabetic   COLONOSCOPY     INCISION AND DRAINAGE OF WOUND Left 09/15/2023   Procedure: IRRIGATION AND DEBRIDEMENT WOUND;  Surgeon: Jennefer Moats, [name];  [name];  Service: Orthopedics/Podiatry;  Laterality: Left;  Left foot wound debridement, possible graft/biopsy   LOWER EXTREMITY ANGIOGRAPHY Right 11/02/2022   Procedure: Lower Extremity Angiography;  Surgeon: Celso College, MD;  Location: [facility] INVASIVE [facility];  Service: Cardiovascular;  Laterality: Right;   LOWER EXTREMITY ANGIOGRAPHY Right 09/13/2023   Procedure: Lower Extremity Angiography;  Surgeon: Jackquelyn Mass, MD;  Location: [facility] INVAS[facility] CV LAB;  Service: Cardiovascular;  Laterality: Right;   METATARSAL HEAD EXCISION Left 07/05/2018   Procedure: RESECTION FIRST METATARSAL INFECTED BONE AND SOFT TISSUE;  Surgeon: Sharlyn Deaner, [name];  [name];  Service: Podiatry;  Laterality: Left;   METATARSAL HEAD EXCISION Right 04/05/2023   Procedure: METATARSAL HEAD EXCISION THIRD & FOURTH;  Surgeon: Dot Gazella, [name];  [name];  Service: Podiatry;  Laterality: Right;   PACEMAKER INSERTION  09/15/2020   TOE AMPUTATION Left    TONSILLECTOMY      Medications Prior to Admission  Medication Sig Dispense Refill Last Dose/Taking   acetaminophen  (TYLENOL ) 500 MG tablet Take 2 tablets (1,000 mg total) by mouth 3 (three) times daily as needed (for pain).   Taking As Needed   aspirin  EC 81 MG tablet Take 1 tablet (81 mg total) by mouth daily.  Swallow whole. 30 tablet 0 01/25/2024   atorvastatin  (LIPITOR ) 10 MG tablet Take 1 tablet (10 mg total) by mouth daily at 6 PM. 30 tablet 11 Taking   bumetanide  (BUMEX ) 2 MG tablet Take 1 tablet (2 mg total) by mouth daily. 90 tablet 3 Past Week   clopidogrel  (PLAVIX ) 75 MG tablet Take 1 tablet (75 mg total) by mouth daily with breakfast. 30 tablet 11 01/25/2024 Morning   Finerenone  (KERENDIA ) 10 MG TABS Take 1 tablet (10 mg total) by mouth daily. 30 tablet 5 Past Week   furosemide  (LASIX ) 40 MG tablet Take 40 mg by mouth 2 (two) times daily.   01/25/2024   gentamicin  cream (GARAMYCIN ) 0.1 % Apply 1 Application topically 2 (two) times daily. 30 g 1 Taking   glipiZIDE  (GLUCOTROL ) 10 MG tablet Take 1 tablet (10 mg total) by mouth 2 (two) times daily. 60 tablet 2 Past Month   losartan  (COZAAR ) 25 MG tablet Take 25 mg by mouth daily.   Taking   metolazone  (ZAROXOLYN ) 2.5 MG tablet Take 1 tablet (2.5  mg total) by mouth 2 (two) times a week. On Mondays & Fridays   Past Month   Multiple Vitamins-Minerals (MULTIVITAMIN WITH MINERALS) tablet Take 1 tablet by mouth daily.   01/25/2024   silver  sulfADIAZINE  (SILVADENE ) 1 % cream Apply to affected area daily 50 g 1 Taking   doxycycline  (VIBRA -TABS) 100 MG tablet Take 1 tablet (100 mg total) by mouth 2 (two) times daily. (Patient not taking: Reported on 01/26/2024) 20 tablet 0 Not Taking   metFORMIN  (GLUCOPHAGE ) 1000 MG tablet Take 1 tablet (1,000 mg total) by mouth 2 (two) times daily. (Patient not taking: Reported on 01/26/2024) 60 tablet 2 Not Taking   Social History   Socioeconomic History   Marital status: Single    Spouse name: Not on file   Number of children: Not on file   Years of education: Not on file   Highest education level: Not on file  Occupational History    Comment: [employer]  Tobacco Use   Smoking status: Never   Smokeless tobacco: Never  Vaping Use   Vaping status: Never Used  Substance and Sexual Activity   Alcohol use: Not  Currently    Comment: rarely   Drug use: Never   Sexual activity: Not Currently    Birth control/protection: None  Other Topics Concern   Not on file  Social History Narrative   Lives with roommate   Social Drivers of Health   Financial Resource Strain: Low Risk  (03/20/2023)   Received from [facility], [facility]   Overall Financial Resource Strain (CARDIA)    Difficulty of Paying Living Expenses: Not hard at all  Food Insecurity: No Food Insecurity (01/27/2024)   Hunger Vital Sign    Worried About Running Out of Food in the Last Year: Never true    Ran Out of Food in the Last Year: Never true  Transportation Needs: No Transportation Needs (01/27/2024)   PRAPARE - Administrator, Civil Service (Medical): No    Lack of Transportation (Non-Medical): No  Physical Activity: Not on file  Stress: No Stress Concern Present (09/07/2020)   Received from [facility], [facility] of [facility] - Occupational Stress Questionnaire    Feeling of Stress : Not at all  Social Connections: Socially Isolated (01/27/2024)   Social Connection and Isolation Panel [NHANES]    Frequency of Communication with Friends and Family: More than three times a week    Frequency of Social Gatherings with Friends and Family: Twice a week    Attends Religious Services: Never    Database administrator or Organizations: No    Attends Banker Meetings: Never    Marital Status: Divorced  Catering manager Violence: Not At Risk (01/27/2024)   Humiliation, Afraid, Rape, and Kick questionnaire    Fear of Current or Ex-Partner: No    Emotionally Abused: No    Physically Abused: No    Sexually Abused: No    Family History  Problem Relation Age of Onset   Cancer Niece      Vitals:   02/03/24 2345 02/04/24 0320 02/04/24 0500 02/04/24 0809  BP: 120/73 118/67    Pulse: 86 94    Resp: 16 20    Temp: 98.1 F (36.7 C) 97.7 F (36.5 C)  98.3 F (36.8 C)   TempSrc: Oral Oral  Oral  SpO2: 95% 95%    Weight:   108 kg   Height:  PHYSICAL EXAM General: chronically ill appearing elderly male, well nourished, in no acute distress. HEENT: Normocephalic and atraumatic. Neck: No JVD.  Lungs: Normal respiratory effort on room air.  Heart: HRRR. Normal S1 and S2 without gallops or murmurs.  Abdomen: Non-distended appearing.  Msk: Normal strength and tone for age. Extremities: Warm and well perfused. No clubbing, cyanosis. No edema bilaterally.  Neuro: Oriented X 2. Psych: Altered  Labs: Basic Metabolic Panel: Recent Labs    02/03/24 0351 02/04/24 0618  NA 139 141  K 3.8 3.8  CL 106 108  CO2 21* 21*  GLUCOSE 189* 197*  BUN 67* 63*  CREATININE 1.46* 1.34*  CALCIUM  8.9 9.3  MG 2.5*  --    Liver Function Tests: No results for input(s): "AST", "ALT", "ALKPHOS", "BILITOT", "PROT", "ALBUMIN" in the last 72 hours.  No results for input(s): "LIPASE", "AMYLASE" in the last 72 hours. CBC: Recent Labs    02/03/24 0351 02/04/24 0618  WBC 11.2* 11.1*  HGB 11.2* 11.5*  HCT 35.9* 37.7*  MCV 86.9 86.7  PLT 234 265   Cardiac Enzymes: No results for input(s): "CKTOTAL", "CKMB", "CKMBINDEX", "TROPONINIHS" in the last 72 hours.  BNP: No results for input(s): "BNP" in the last 72 hours.  D-Dimer: No results for input(s): "DDIMER" in the last 72 hours. Hemoglobin A1C: No results for input(s): "HGBA1C" in the last 72 hours.  Fasting Lipid Panel: No results for input(s): "CHOL", "HDL", "LDLCALC", "TRIG", "CHOLHDL", "LDLDIRECT" in the last 72 hours. Thyroid Function Tests: No results for input(s): "TSH", "T4TOTAL", "T3FREE", "THYROIDAB" in the last 72 hours.  Invalid input(s): "FREET3" Anemia Panel: No results for input(s): "VITAMINB12", "FOLATE", "FERRITIN", "TIBC", "IRON", "RETICCTPCT" in the last 72 hours.   Radiology: DG Knee 1-2 Views Left Result Date: 01/30/2024 CLINICAL DATA:  Knee effusion. EXAM: LEFT KNEE - 2 VIEW  COMPARISON:  None Available. FINDINGS: Osteopenia. Joint space loss mildly of the mediolateral compartment and patellofemoral joint with scattered osteophyte formation. Scattered vascular calcifications are identified. There is some chondrocalcinosis as well of the mediolateral compartments. Subchondral cyst formation along the medial tibial plateau. The lateral view are oblique limiting evaluation. No large effusion but evaluation of a a small effusion is limited. Repeat lateral view could be considered versus additional imaging. IMPRESSION: Degenerative changes. Osteopenia. Chondrocalcinosis. No large effusion but lateral views are limited due to rotation. Subtle effusion is not excluded. Further workup as clinically appropriate Electronically Signed   By: Adrianna Horde M.D.   On: 01/30/2024 16:58   US  EKG SITE RITE Result Date: 01/29/2024 If Site Rite image not attached, placement could not be confirmed due to current cardiac rhythm.  ECHOCARDIOGRAM COMPLETE Result Date: 01/29/2024    ECHOCARDIOGRAM REPORT   Patient Name:   Tyler [name] Date of Exam: 01/28/2024 Medical Rec #:  [id number]      Height:       78.0 in Accession #:    [id number]     Weight:       260.8 lb Date of Birth:  1946-08-24      BSA:          2.527 m Patient Age:    77 years       BP:           103/75 mmHg Patient Gender: M              HR:           96 bpm. Exam Location:  [facility] Procedure: 2D Echo, Cardiac  Doppler and Color Doppler (Both Spectral and Color            Flow Doppler were utilized during procedure). Indications:     NSTEMI I21.4  History:         Patient has no prior history of Echocardiogram examinations,                  most recent 07/04/2023. CHF and Cardiomyopathy, Previous                  Myocardial Infarction, Pacemaker, PAD and CKD, stage 3,                  Arrythmias:Bradycardia, Atrial Fibrillation and Atrial Flutter,                  Signs/Symptoms:Dyspnea and Syncope; Risk Factors:Diabetes,                   Hypertension and Dyslipidemia.  Sonographer:     Terrilee Few RCS Referring Phys:  [id number] Neviah Braud Diagnosing Phys: Lida Reeks Alluri IMPRESSIONS  1. Left ventricular ejection fraction, by estimation, is 25 to 30%. The left ventricle has severely decreased function. The left ventricle demonstrates global hypokinesis. There is mild left ventricular hypertrophy. Left ventricular diastolic parameters  are indeterminate.  2. Right ventricular systolic function is moderately reduced. The right ventricular size is mildly enlarged. There is severely elevated pulmonary artery systolic pressure. The estimated right ventricular systolic pressure is 73.9 mmHg.  3. Left atrial size was mildly dilated.  4. Right atrial size was moderately dilated.  5. Eccentric mitral regurgitation which can be underestimated on this study. Recommend TEE for further evaluation. Moderate to severe mitral valve regurgitation. Mild to moderate mitral stenosis. Severe mitral annular calcification.  6. Sclerotic aortic valve with low flow low gradient aortic stenosis, probably mild to moderate.. The aortic valve is tricuspid.  7. Aortic dilatation noted. There is mild dilatation of the aortic root, measuring 44 mm. FINDINGS  Left Ventricle: Left ventricular ejection fraction, by estimation, is 25 to 30%. The left ventricle has severely decreased function. The left ventricle demonstrates global hypokinesis. There is mild left ventricular hypertrophy. Left ventricular diastolic parameters are indeterminate. Right Ventricle: The right ventricular size is mildly enlarged. No increase in right ventricular wall thickness. Right ventricular systolic function is moderately reduced. There is severely elevated pulmonary artery systolic pressure. The tricuspid regurgitant velocity is 4.06 m/s, and with an assumed right atrial pressure of 8 mmHg, the estimated right ventricular systolic pressure is 73.9 mmHg. Left Atrium: Left atrial size was mildly  dilated. Right Atrium: Right atrial size was moderately dilated. Pericardium: There is no evidence of pericardial effusion. Mitral Valve: Eccentric mitral regurgitation which can be underestimated on this study. Recommend TEE for further evaluation. There is mild thickening of the mitral valve leaflet(s). There is mild calcification of the mitral valve leaflet(s). Severe mitral annular calcification. Moderate to severe mitral valve regurgitation, with anteriorly-directed jet. Mild to moderate mitral valve stenosis. MV peak gradient, 14.7 mmHg. The mean mitral valve gradient is 8.4 mmHg. Tricuspid Valve: The tricuspid valve is normal in structure. Tricuspid valve regurgitation is mild. Aortic Valve: Sclerotic aortic valve with low flow low gradient aortic stenosis, probably mild to moderate. The aortic valve is tricuspid. Aortic valve mean gradient measures 10.0 mmHg. Aortic valve peak gradient measures 16.4 mmHg. Aortic valve area, by  VTI measures 1.43 cm. Pulmonic Valve: The pulmonic valve was not well visualized. Pulmonic valve regurgitation is  trivial. Aorta: Aortic dilatation noted. There is mild dilatation of the aortic root, measuring 44 mm. IAS/Shunts: The interatrial septum was not well visualized.  LEFT VENTRICLE PLAX 2D LVIDd:         5.50 cm   Diastology LVIDs:         4.50 cm   LV e' medial:    5.87 cm/s LV PW:         1.60 cm   LV E/e' medial:  22.5 LV IVS:        1.50 cm   LV e' lateral:   10.80 cm/s LVOT diam:     2.30 cm   LV E/e' lateral: 12.2 LV SV:         53 LV SV Index:   21 LVOT Area:     4.15 cm  RIGHT VENTRICLE            IVC RV S prime:     8.59 cm/s  IVC diam: 3.00 cm TAPSE (M-mode): 1.6 cm LEFT ATRIUM              Index        RIGHT ATRIUM           Index LA diam:        5.90 cm  2.33 cm/m   RA Area:     28.30 cm LA Vol (A2C):   114.0 ml 45.11 ml/m  RA Volume:   112.00 ml 44.32 ml/m LA Vol (A4C):   79.7 ml  31.54 ml/m LA Biplane Vol: 95.1 ml  37.63 ml/m  AORTIC VALVE AV Area  (Vmax):    1.77 cm AV Area (Vmean):   1.56 cm AV Area (VTI):     1.43 cm AV Vmax:           202.50 cm/s AV Vmean:          144.500 cm/s AV VTI:            0.375 m AV Peak Grad:      16.4 mmHg AV Mean Grad:      10.0 mmHg LVOT Vmax:         86.13 cm/s LVOT Vmean:        54.333 cm/s LVOT VTI:          0.129 m LVOT/AV VTI ratio: 0.34  AORTA Ao Asc diam: 3.80 cm MITRAL VALVE                TRICUSPID VALVE MV Area (PHT): 2.66 cm     TR Peak grad:   65.9 mmHg MV Area VTI:   1.36 cm     TR Vmax:        406.00 cm/s MV Peak grad:  14.7 mmHg MV Mean grad:  8.4 mmHg     SHUNTS MV Vmax:       1.91 m/s     Systemic VTI:  0.13 m MV Vmean:      134.8 cm/s   Systemic Diam: 2.30 cm MV Decel Time: 285 msec MV E velocity: 132.00 cm/s MV A velocity: 98.50 cm/s MV E/A ratio:  1.34 Joetta Mustache Electronically signed by Joetta Mustache Signature Date/Time: 01/29/2024/1:23:56 PM    Final    CT Angio Chest Pulmonary Embolism (PE) W or WO Contrast Result Date: 01/27/2024 CLINICAL DATA:  Chest pain.  PE suspected EXAM: CT ANGIOGRAPHY CHEST WITH CONTRAST TECHNIQUE: Multidetector CT imaging of the chest was performed using the standard protocol during bolus administration of intravenous contrast.  Multiplanar CT image reconstructions and MIPs were obtained to evaluate the vascular anatomy. RADIATION DOSE REDUCTION: This exam was performed according to the departmental dose-optimization program which includes automated exposure control, adjustment of the mA and/or kV according to patient size and/or use of iterative reconstruction technique. CONTRAST:  75mL OMNIPAQUE  IOHEXOL  350 MG/ML SOLN COMPARISON:  Same day chest radiograph FINDINGS: Cardiovascular: Cardiomegaly. Mitral annular calcification. Coronary artery and aortic atherosclerotic calcification. Mild dilation of the ascending aorta measuring 40 mm in diameter. No pericardial effusion. Negative for acute pulmonary embolism. Left chest wall pacemaker. Mediastinum/Nodes: Trachea and  esophagus are unremarkable. Shotty mediastinal lymph nodes are likely reactive. Lungs/Pleura: Small right pleural effusion. Interlobular septal thickening and hazy ground-glass opacities with a lower lung and posterior predominance. No focal consolidation, pleural effusion, or pneumothorax. Upper Abdomen: No acute abnormality. Musculoskeletal: No acute fracture. Review of the MIP images confirms the above findings. IMPRESSION: 1. Negative for acute pulmonary embolism. 2. Findings suggestive of CHF with mild pulmonary edema. Small right pleural effusion. 3. 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 Recommend annual imaging followup by CTA or MRA. This recommendation follows 2010 ACCF/AHA/AATS/ACR/ASA/SCA/SCAI/SIR/STS/SVM Guidelines for the Diagnosis and Management of Patients with Thoracic Aortic Disease. Circulation. 2010; 121: Z610-R604. Aortic aneurysm NOS (ICD10-I71.9) 4. Aortic Atherosclerosis (ICD10-I70.0). Electronically Signed   By: Rozell Cornet M.D.   On: 01/27/2024 22:11   CT Head Wo Contrast Result Date: 01/26/2024 CLINICAL DATA:  Mental status change. EXAM: CT HEAD WITHOUT CONTRAST TECHNIQUE: Contiguous axial images were obtained from the base of the skull through the vertex without intravenous contrast. RADIATION DOSE REDUCTION: This exam was performed according to the departmental dose-optimization program which includes automated exposure control, adjustment of the mA and/or kV according to patient size and/or use of iterative reconstruction technique. COMPARISON:  Head CT 07/04/2018. FINDINGS: Brain: No evidence of acute infarction, hemorrhage, hydrocephalus, extra-axial collection or mass lesion/mass effect. There is mild diffuse atrophy and mild periventricular white matter hypodensity which is similar to the prior study. Vascular: Atherosclerotic calcifications are present within the cavernous internal carotid arteries. Skull: Normal. Negative for fracture or focal lesion. Sinuses/Orbits: No  acute finding. Other: None. IMPRESSION: 1. No acute intracranial process. 2. Mild diffuse atrophy and mild chronic small vessel ischemic changes. Electronically Signed   By: Tyron Gallon M.D.   On: 01/26/2024 20:18   DG Chest Portable 1 View Result Date: 01/26/2024 CLINICAL DATA:  Chest pain EXAM: PORTABLE CHEST 1 VIEW COMPARISON:  09/11/2023 FINDINGS: Left-sided pacing device similar in position. Cardiomegaly with mild central congestion. No consolidation, pleural effusion or pneumothorax. IMPRESSION: Cardiomegaly with mild central congestion. Electronically Signed   By: Esmeralda Hedge M.D.   On: 01/26/2024 20:14    ECHO as above  TELEMETRY reviewed by me 02/04/2024: ventricular pacing rate 90s  EKG reviewed by me: atrial fibrillation, rate 112 bpm PVCs.    Data reviewed by me 02/04/2024: last 24h vitals tele labs imaging I/O's hospitalist progress note, rapid response notes, ID notes, advanced heart failure notes, EP notes, ortho notes  Principal Problem:   NSTEMI (non-ST elevated myocardial infarction) (HCC) Active Problems:   Sepsis due to cellulitis (HCC)   Acute on chronic systolic CHF (congestive heart failure) (HCC)   Type II diabetes mellitus with renal manifestations (HCC)   Dyslipidemia   Essential hypertension   Acute kidney injury superimposed on stage 3a chronic kidney disease (HCC) - baseline scr 1.3   Pressure injury of skin   Septicemia due to group B Streptococcus (HCC)  Chronic respiratory failure with hypoxia (HCC)   Effusion of left knee   Pseudogout of left knee   Bacteremia due to group B Streptococcus    ASSESSMENT AND PLAN:  Tyler [name] is a 78 y.o. male  with a past medical history of chronic HFrEF, persistent atrial fibrillation, mitral insufficiency, CHB s/p PPM 2021, hypertension, chronic kidney disease stage III who presented to the ED on 01/26/2024 for chest pain. Cardiology was consulted for further evaluation.   # Severe sepsis # Strep agalactiae  bacteremia Rapid response called overnight 5/6 for acute decompensated respiratory status, tachycardia, fever.  Meets sepsis criteria. Blood cultures growing strep agalactiae with high bioburden.  No clear evidence of any vegetation on TTE. -ID following, appreciate recommendations.  -Plan for TEE today at 12 PM.   # NSTEMI, type I vs type II # Acute on chronic HFrEF Patient reports to ED due to worsening SOB. Patient was seen outpatient cardiology on 09/2023 and there was plan for ischemic eval with PE stress test, however patient did not perform this exam or follow-up since this visit. Patient appears fluid overload on exam. Troponins elevated and trending 69 > 105 > 326 > 714 > 1300 > 1800. EKG in ED with AF PVCs with no acute ischemic changes, rate 112 bpm. Cr is elevated at 1.56 (baseline around 1.35). BP is stable. BNP elevated at 4043.  Rapid response called overnight 5/6 for acute decompensated respiratory status, tachycardia, fever.  Meets sepsis criteria.  Echo this admission with EF 25-30%, global hypokinesis, moderate RV dysfunction, severely elevated PASP, eccentric moderate to severe MR, low-flow low gradient mild to moderate aortic stenosis. -Continue to monitor mental status.  -Advanced heart failure signed off 5/13.  Appreciate recommendations. -Continue po Bumex  2 mg daily. Net negative 12.5L since admission.  -Resume metoprolol  succinate 25 mg daily. Further additions to GDMT pending BP and renal function.  -Continue heparin  gtt.  -Continue ASA 81 mg, clopidogrel  75 mg, atorvastatin  80 mg daily. -Patient will eventually need ischemic evaluation.   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 Underwent PPM implant in 2021 for CHB, has had regular device checks since implant. EKG in ED and per tele this AM AF rates in 100s.   -Beta-blocker as above.  -Recommend anticoagulation however patient has historically deferred given prior bleeding issues. -EP consulted for further evaluation given high bioburden bacteremia and PPM in place. Appreciate their recommendations. May require extraction pending TEE results.   # Chronic kidney disease stage IIIa Patient with hx of CKD, baseline Cr 1.3-1.4. Cr this AM 1.34. BUN 63. -Continue to monitor renal function closely during diuresis. -Management per primary.   Patient with multiple co-morbidities, now declining mental status. Prognosis is guarded.    This patient's plan of care was discussed and created with Dr. Custovic and she is in agreement.  Signed: Hamp Levine, PA-C  02/04/2024, 8:45 AM [facility]

## 2024-02-04 NOTE — Progress Notes (Signed)
 Date of Admission:  01/26/2024      ID: Tyler [name] is a 78 y.o. 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)   Type II diabetes mellitus with renal manifestations (HCC)   Dyslipidemia   Essential hypertension   Acute kidney injury superimposed on stage 3a chronic kidney disease (HCC) - baseline scr 1.3   Pressure injury of skin   Septicemia due to group B Streptococcus (HCC)   Chronic respiratory failure with hypoxia (HCC)   Effusion of left knee   Pseudogout of left knee   Bacteremia due to group B Streptococcus    Subjective: Pt is tired and sleepy  Medications:   vitamin C   500 mg Oral BID   aspirin   81 mg Oral Daily   atorvastatin   80 mg Oral q1800   bumetanide   2 mg Oral Daily   Chlorhexidine  Gluconate Cloth  6 each Topical Daily   feeding supplement (NEPRO CARB STEADY)  237 mL Oral BID BM   gentamicin  cream  1 Application Topical BID   insulin  aspart  0-15 Units Subcutaneous TID WC   insulin  aspart  0-5 Units Subcutaneous QHS   insulin  glargine-yfgn  5 Units Subcutaneous QHS   metoprolol  succinate  25 mg Oral Daily   multivitamin with minerals  1 tablet Oral Daily   silver  sulfADIAZINE    Topical Daily    Objective: Vital signs in last 24 hours: Patient Vitals for the past 24 hrs:  BP Temp Temp src Pulse Resp SpO2 Weight  02/04/24 1353 110/71 98.7 F (37.1 C) Oral 93 -- 96 % --  02/04/24 1315 (!) 120/107 -- -- 92 (!) 31 97 % --  02/04/24 1305 108/65 -- -- 96 -- 94 % --  02/04/24 1245 (!) 80/52 -- -- 99 (!) 28 95 % --  02/04/24 1209 (!) 153/134 98.8 F (37.1 C) Oral (!) 104 (!) 23 97 % --  02/04/24 0809 -- 98.3 F (36.8 C) Oral -- -- -- --  02/04/24 0500 -- -- -- -- -- -- 108 kg  02/04/24 0320 118/67 97.7 F (36.5 C) Oral 94 20 95 % --  02/03/24 2345 120/73 98.1 F (36.7 C) Oral 86 16 95 % --  02/03/24  2100 -- -- -- 78 16 99 % --  02/03/24 2000 106/76 -- -- -- -- -- --  02/03/24 1930 110/62 98 F (36.7 C) Oral 82 (!) 26 98 % --       PHYSICAL EXAM:  General: somnolent Answers questions  lungs: Bilateral air entry.  crepts Heart: Irregular Abdomen: Soft, non-tender,not distended. Bowel sounds normal. No masses Extremities: Left knee swelling improved Edema legs Multiple wounds on both feet Skin: Venous stasis pigmentation extremities   lymph: Cervical, supraclavicular normal. Neurologic: Grossly non-focal  Lab Results    Latest Ref Rng & Units 02/04/2024    6:18 AM 02/03/2024    3:51 AM 02/02/2024    4:00 AM  CBC  WBC 4.0 - 10.5 K/uL 11.1  11.2  9.4   Hemoglobin 13.0 - 17.0 g/dL 21.3  08.6  57.8   Hematocrit 39.0 - 52.0 % 37.7  35.9  35.4   Platelets 150 - 400 K/uL 265  234  216        Latest Ref Rng & Units 02/04/2024    6:18 AM 02/03/2024    3:51 AM  02/02/2024    4:00 AM  CMP  Glucose 70 - 99 mg/dL 161  096  045   BUN 8 - 23 mg/dL 63  67  74   Creatinine 0.61 - 1.24 mg/dL 4.09  8.11  9.14   Sodium 135 - 145 mmol/L 141  139  139   Potassium 3.5 - 5.1 mmol/L 3.8  3.8  3.8   Chloride 98 - 111 mmol/L 108  106  104   CO2 22 - 32 mmol/L 21  21  21    Calcium  8.9 - 10.3 mg/dL 9.3  8.9  8.8       Microbiology:  Studies/Results: No results found.    Assessment/Plan:  Group B streptococcus bacteremia 4 out of 4 high bioburden.  With pacemaker need to rule out endocarditis and wire vegetation.  Ideally would need pacemaker removal but as per EPI/cardiology  he is not a candidate currently 2D echo has not shown valve vegetations so he will need TEE.once he is stable Reepat blood culture neg so far Will avoid PICC line placement until the bacteremia has cleared and also once we know the status of his heart valves.  Patient was on IV ceftriaxone  and changed to vanco by primary team to cover MRSA, enterococcus in wounds which likely are colonizing and would not treat  now because of CKD avoid vanco Will continue with ceftriaxone  and DC vanco   CHF followed by cardiology NSTEMI  A-fib  Bilateral wounds on his feet Culture shows rare group B streptococcus, rare Staph aureus, rare Enterococcus No addition of antibiotics currently.  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  Status post aspiration.  Culture NG   Discussed the management with the patient and care team.

## 2024-02-04 NOTE — Inpatient Diabetes Management (Signed)
 Inpatient Diabetes Program Recommendations  AACE/ADA: New Consensus Statement on Inpatient Glycemic Control   Target Ranges:  Prepandial:   less than 140 mg/dL      Peak postprandial:   less than 180 mg/dL (1-2 hours)      Critically ill patients:  140 - 180 mg/dL    Latest Reference Range & Units 02/03/24 07:54 02/03/24 11:52 02/03/24 14:58 02/03/24 20:35 02/04/24 08:09  Glucose-Capillary 70 - 99 mg/dL 409 (H) 811 (H) 914 (H) 216 (H) 193 (H)    Latest Reference Range & Units 02/02/24 08:27 02/02/24 11:59 02/02/24 16:55 02/02/24 20:47  Glucose-Capillary 70 - 99 mg/dL 782 (H) 956 (H) 213 (H) 178 (H)   Review of Glycemic Control  Diabetes history: DM2 Outpatient Diabetes medications: Glipizide  10 mg BID, Metformin  1000 mg BID (not taking) Current orders for Inpatient glycemic control: Novolog  0-15 units TID with meals, Novolog  0-5 units QHS   Inpatient Diabetes Program Recommendations:     Insulin : Once diet resumed, please consider ordering Novolog  2 units TID with meals for meal coverage if patient eats at least 50% of meals.   NOTE: Currently NPO for TEE today.  Thanks, Beacher Limerick, RN, MSN, CDCES Diabetes Coordinator Inpatient Diabetes Program [phone] (Team Pager from 8am to 5pm)

## 2024-02-04 NOTE — Progress Notes (Signed)
 Advanced Heart Failure Rounding Note  Cardiologist: Janette Medley, MD   Chief Complaint: Heart failure, NSTEMI  Subjective:    Sleeping soundly, awaiting TEE.    Objective:   Weight Range: 108 kg Body mass index is 27.51 kg/m.   Vital Signs:   Temp:  [97.7 F (36.5 C)-98.8 F (37.1 C)] 98.2 F (36.8 C) (05/13 1529) Pulse Rate:  [78-104] 93 (05/13 1353) Resp:  [16-31] 31 (05/13 1315) BP: (80-153)/(52-134) 110/71 (05/13 1353) SpO2:  [94 %-99 %] 96 % (05/13 1353) Weight:  [108 kg] 108 kg (05/13 0500) Last BM Date : 02/03/24  Weight change: Filed Weights   01/31/24 0508 02/01/24 0500 02/04/24 0500  Weight: 103 kg 100.7 kg 108 kg    Intake/Output:   Intake/Output Summary (Last 24 hours) at 02/04/2024 2037 Last data filed at 02/04/2024 1714 Gross per 24 hour  Intake 1892.68 ml  Output 1375 ml  Net 517.68 ml    Physical Exam   General:  Chronically ill appearing, lethargic Neck: no JVD.  ZOX:WRUEAVW rate & rhythm. Systolic murmur. Lungs: clear Abdomen: soft, nontender, nondistended.  Extremities: trace edema, improving LE edema Neuro: Responds slowly, alert to preson    Telemetry   V paced 70s with underlying Afib, PVCs  Labs    CBC Recent Labs    02/03/24 0351 02/04/24 0618  WBC 11.2* 11.1*  HGB 11.2* 11.5*  HCT 35.9* 37.7*  MCV 86.9 86.7  PLT 234 265   Basic Metabolic Panel Recent Labs    09/81/19 0351 02/04/24 0618  NA 139 141  K 3.8 3.8  CL 106 108  CO2 21* 21*  GLUCOSE 189* 197*  BUN 67* 63*  CREATININE 1.46* 1.34*  CALCIUM  8.9 9.3  MG 2.5*  --    Liver Function Tests No results for input(s): "AST", "ALT", "ALKPHOS", "BILITOT", "PROT", "ALBUMIN" in the last 72 hours.  No results for input(s): "LIPASE", "AMYLASE" in the last 72 hours. Cardiac Enzymes No results for input(s): "CKTOTAL", "CKMB", "CKMBINDEX", "TROPONINI" in the last 72 hours.  BNP: BNP (last 3 results) Recent Labs    09/11/23 1627 10/28/23 1107  01/27/24 0844  BNP 3,874.2* 754.7* 4,043.5*    ProBNP (last 3 results) Recent Labs    10/02/23 1111  PROBNP 3,757*     D-Dimer No results for input(s): "DDIMER" in the last 72 hours. Hemoglobin A1C No results for input(s): "HGBA1C" in the last 72 hours.  Fasting Lipid Panel No results for input(s): "CHOL", "HDL", "LDLCALC", "TRIG", "CHOLHDL", "LDLDIRECT" in the last 72 hours. Thyroid Function Tests No results for input(s): "TSH", "T4TOTAL", "T3FREE", "THYROIDAB" in the last 72 hours.  Invalid input(s): "FREET3"  Other results:     Medications:   Scheduled Medications:  vitamin C   500 mg Oral BID   aspirin   81 mg Oral Daily   atorvastatin   80 mg Oral q1800   bumetanide   2 mg Oral Daily   Chlorhexidine  Gluconate Cloth  6 each Topical Daily   feeding supplement (NEPRO CARB STEADY)  237 mL Oral BID BM   gentamicin  cream  1 Application Topical BID   insulin  aspart  0-15 Units Subcutaneous TID WC   insulin  aspart  0-5 Units Subcutaneous QHS   insulin  glargine-yfgn  5 Units Subcutaneous QHS   metoprolol  succinate  25 mg Oral Daily   multivitamin with minerals  1 tablet Oral Daily   silver  sulfADIAZINE    Topical Daily    Infusions:  cefTRIAXone  (ROCEPHIN )  IV 2 g (  02/04/24 1711)   heparin  2,300 Units/hr (02/04/24 1653)    PRN Medications: acetaminophen  **OR** acetaminophen , magnesium  hydroxide, melatonin, nitroGLYCERIN , ondansetron  **OR** ondansetron  (ZOFRAN ) IV  Patient Profile   Tyler [name] is a 78 y/o male with a history of persistent atrial fibrillation 06/21 (not on anticoag), HTN, T2DM, CKD, cellulitis, hyperlipidemia, 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 Admitted with A/C HFrEF, sepsis and NSTEMI.   Assessment/Plan   Acute on chronic HFrEF - Echo 12/21 (at time of pacer )  EF 55-60% - Echo 07/03/23: EF 30-35% with moderate Tyler - Echo 01/28/24: EF 25-30%, RV moderately reduced, RVSP 74 mmhg, moderate to severe Tyler - NYHA IV on admission - Suspect drop in EF may be related to chronic RV pacing (less likely progression of CAD) - Initially volume overloaded after resuscitation for sepsis. Diuresed well. Now appears euvolemic on 2 mg bumex  daily.  - GDMT on hold with AKI, profound weakness/acute illness, doubt that he will tolerate much in the future - Little benefit to ischemic evaluation in the lack of chest pain, agree with EP that would not offer device extraction or upgrade   NSTEMI - Denies new chest pain. Admitted with persistent 5/10 chest pain, has had it for years. - HsTrop peaked at 1800. Suspect demand ischemia - No s/s angina - On ASA, plavix  stoped - Not candidate for cath at this time   Sepsis  - UA with UTI (chronic foley) + wounds - Foot wound cultures grew enterococcus, group B strep and MRSA.  - Blood cultures grew group B strep agalactiae - L knee aspiration 05/08, NGTD on culture - Pending TEE to r/o vegetation/endocarditis. Currently not candidate for device extraction/upgrade (EP has seen) - Continues on IV vancomycin    Persistent atrial fibrillation - Rate controlled.  - Continue heparin  gtt, has refused anticoagulation in the past, risk benefit discussion about continuing prior to discharge   AV block s/p MDT PPM  - Has not been compliant with EP follow ups.  - DIscussion as above. 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 Not candidate currently for CRT upgrade   AKI on CKD IIIa due to ATN - Baseline SCr ~1.2-1.5 - SCr peaked 2.4, down to 1.46 today  7. NSVT - Keep K > 4.0 Mg > 2.0 - Can use amio as needed  8. GOC -Palliative Care consulted. Appreciate input. -Currently full code. Long-term prognosis likely poor. Family has now made DNR   Patient is relatively euvolemic on a stable dose of oral diuretics.  TEE planned today for evaluation of  pacemaker leads and valves.  Advanced heart failure will sign off at this time, please reach out with further questions.  Length of Stay: 7  Lauralee Poll, MD  02/04/2024, 8:37 PM  Advanced Heart Failure Team Pager [phone] (M-F; 7a - 5p)  Please contact [facility] for night-coverage after hours (5p -7a ) and weekends on [email or web address]

## 2024-02-04 NOTE — Anesthesia Postprocedure Evaluation (Signed)
 Anesthesia Post Note  Patient: Tyler [name]  Procedure(s) Performed: ECHOCARDIOGRAM, TRANSESOPHAGEAL  Patient location during evaluation: Specials Recovery Anesthesia Type: General Level of consciousness: awake and alert Pain management: pain level controlled Vital Signs Assessment: post-procedure vital signs reviewed and stable Respiratory status: spontaneous breathing, nonlabored ventilation, respiratory function stable and patient connected to nasal cannula oxygen Cardiovascular status: blood pressure returned to baseline and stable Postop Assessment: no apparent nausea or vomiting Anesthetic complications: no   No notable events documented.   Last Vitals:  Vitals:   02/04/24 0809 02/04/24 1209  BP:  (!) 153/134  Pulse:  (!) 104  Resp:  (!) 23  Temp: 36.8 C 37.1 C  SpO2:  97%    Last Pain:  Vitals:   02/04/24 1209  TempSrc: Oral  PainSc: 0-No pain                 Nancey Awkward

## 2024-02-04 NOTE — Plan of Care (Signed)
  Problem: Education: Goal: Ability to describe self-care measures that may prevent or decrease complications (Diabetes Survival Skills Education) will improve Outcome: Progressing Goal: Individualized Educational Video(s) Outcome: Progressing   Problem: Coping: Goal: Ability to adjust to condition or change in health will improve Outcome: Progressing   Problem: Fluid Volume: Goal: Ability to maintain a balanced intake and output will improve Outcome: Progressing   Problem: Health Behavior/Discharge Planning: Goal: Ability to identify and utilize available resources and services will improve Outcome: Progressing Goal: Ability to manage health-related needs will improve Outcome: Progressing   Problem: Metabolic: Goal: Ability to maintain appropriate glucose levels will improve Outcome: Progressing   Problem: Nutritional: Goal: Maintenance of adequate nutrition will improve Outcome: Progressing Goal: Progress toward achieving an optimal weight will improve Outcome: Progressing   Problem: Skin Integrity: Goal: Risk for impaired skin integrity will decrease Outcome: Progressing   Problem: Tissue Perfusion: Goal: Adequacy of tissue perfusion will improve Outcome: Progressing   Problem: Education: Goal: Knowledge of General Education information will improve Description: Including pain rating scale, medication(s)/side effects and non-pharmacologic comfort measures Outcome: Progressing   Problem: Health Behavior/Discharge Planning: Goal: Ability to manage health-related needs will improve Outcome: Progressing   Problem: Clinical Measurements: Goal: Ability to maintain clinical measurements within normal limits will improve Outcome: Progressing Goal: Will remain free from infection Outcome: Progressing Goal: Diagnostic test results will improve Outcome: Progressing Goal: Respiratory complications will improve Outcome: Progressing Goal: Cardiovascular complication will  be avoided Outcome: Progressing   Problem: Activity: Goal: Risk for activity intolerance will decrease Outcome: Progressing   Problem: Nutrition: Goal: Adequate nutrition will be maintained Outcome: Progressing   Problem: Coping: Goal: Level of anxiety will decrease Outcome: Progressing   Problem: Elimination: Goal: Will not experience complications related to bowel motility Outcome: Progressing Goal: Will not experience complications related to urinary retention Outcome: Progressing   Problem: Pain Managment: Goal: General experience of comfort will improve and/or be controlled Outcome: Progressing   Problem: Safety: Goal: Ability to remain free from injury will improve Outcome: Progressing   Problem: Skin Integrity: Goal: Risk for impaired skin integrity will decrease Outcome: Progressing   Problem: Fluid Volume: Goal: Hemodynamic stability will improve Outcome: Progressing   Problem: Clinical Measurements: Goal: Diagnostic test results will improve Outcome: Progressing Goal: Signs and symptoms of infection will decrease Outcome: Progressing   Problem: Respiratory: Goal: Ability to maintain adequate ventilation will improve Outcome: Progressing

## 2024-02-04 NOTE — Progress Notes (Signed)
                                                     Palliative Care Progress Note, Assessment & Plan   Patient Name: Tyler [name]       Date: 02/04/2024 DOB: [birth year]  Age: 78 y.o. MRN#: [id number] Attending Physician: Unk Garb, DO Primary Care Physician: McClanahan, Kyra, NP Admit Date: 01/26/2024  Subjective: Patient is lying in bed, sleeping, in no apparent distress.  His daughter [name] is at bedside during my visit.  HPI: 78 y.o. male  with past medical history of HFpEF, A-fib/flutter, AV block s/p PPM (2021), CAD, CKD stage III, hypertension and hyperlipidemia admitted from home on 01/26/2024 with dizziness, lightheadedness and confusion.  Also reported dull chest pain.   EKG on arrival showed A-fib with RVR, PVCs and L BBB Elevated troponin and significantly elevated BNP Chest x-ray with cardiomegaly and congestion   During this admission found to have positive blood cultures growing group B streptococcus, ID consulted Concern for endocarditis and or pacing wire vegetation as well as concern for left knee septic arthritis S/p left knee aspiration   Advanced heart failure team also following   Palliative medicine was consulted for assisting with goals of care conversations  Summary of counseling/coordination of care: Extensive chart review completed prior to meeting patient including labs, vital signs, imaging, progress notes, orders, and available advanced directive documents from current and previous encounters.   After reviewing the patient's chart and assessing the patient at bedside, I spoke with patient's daughter in regards to symptom management and goals of care.   Daughter endorses that patient awakens with mild pain.  However, she understands that patient will be taken to TEE soon and therefore does not need medication at this time.   They plan to take him within the hour.  Nonverbal signs of pain such as brow furrowing, grimacing, fidgeting, or moaning not noted.  Therapeutic silence, active listening, and emotional support provided.  Space and opportunity provided for Carmela to share thoughts and emotions.  Awaiting TEE.  No adjustment to plan of care at this time.  PMT will continue to follow and support patient and family throughout his hospitalization.  Physical Exam Vitals reviewed.  Constitutional:      General: He is not in acute distress.    Appearance: He is not ill-appearing.  Cardiovascular:     Rate and Rhythm: Normal rate. Rhythm irregular.  Pulmonary:     Effort: Pulmonary effort is normal.  Abdominal:     Palpations: Abdomen is soft.  Musculoskeletal:     Comments: Generalized weakness  Psychiatric:        Mood and Affect: Mood normal. Mood is not anxious.        Behavior: Behavior normal. Behavior is not agitated.             Total Time 25 minutes   Time spent includes: Detailed review of medical records (labs, imaging, vital signs), medically appropriate exam (mental status, respiratory, cardiac, skin), discussed with treatment team, counseling and educating patient, family and staff, documenting clinical information, medication management and coordination of care.  Judeen Nose L. Rebbeca Campi, DNP, FNP-BC Palliative Medicine Team

## 2024-02-04 NOTE — Progress Notes (Signed)
*  PRELIMINARY RESULTS* Echocardiogram Echocardiogram Transesophageal has been performed.  Broadus Canes 02/04/2024, 12:43 PM

## 2024-02-04 NOTE — Transfer of Care (Signed)
 Immediate Anesthesia Transfer of Care Note  Patient: Tyler [name]  Procedure(s) Performed: ECHOCARDIOGRAM, TRANSESOPHAGEAL  Patient Location: spu  Anesthesia Type:General  Level of Consciousness: drowsy  Airway & Oxygen Therapy: Patient Spontanous Breathing and Patient connected to nasal cannula oxygen  Post-op Assessment: Report given to RN and Post -op Vital signs reviewed and stable  Post vital signs: Reviewed  Last Vitals:  Vitals Value Taken Time  BP 113/97   Temp    Pulse 97   Resp 22   SpO2 98     Last Pain:  Vitals:   02/04/24 1209  TempSrc: Oral  PainSc: 0-No pain      Patients Stated Pain Goal: 2 (01/29/24 2120)  Complications: No notable events documented.

## 2024-02-04 NOTE — TOC Progression Note (Addendum)
 Transition of Care [facility]) - Progression Note    Patient Details  Name: PRYNCETON HOLTZINGER MRN: [id number] Date of Birth: July 10, 1946  Transition of Care [facility]) CM/SW Contact  Odilia Bennett, LCSW Phone Number: 02/04/2024, 10:16 AM  Clinical Narrative: No call back from [facility] [place]. Tried calling admissions coordinator again but phone goes straight to voicemail. CSW met with patient and daughter Quinlan. Patient was only briefly awake before falling asleep. Updated Carmel regarding bed offers. She will update her sister and follow up on decision.  1:04 pm: [facility] admissions coordinator phone is still going straight to voicemail. Tried to reach her office number as well but no answer or voicemail available. CSW obtained fax number from secretary and faxed referral.  1:31 pm: Received call from daughter [name]. Provided update. She would also like to check [facility] [place]. CSW left voicemail for admissions coordinator.  1:41 pm: CSW called [employer] due to distance of facilities we are looking into. They asked if any local facilities could take him and CSW explained that we are trying to get him closer to family. [employer] rep said that insurance won't pay for a ride that far for convenience so for a ride of 50 miles, private pay would be around $1416. CSW called and updated daughter [name]. She stated they are unable to pay that amount for transport and asked if he had to go by ambulance. Patient remains on acute oxygen and insurance would not cover both home oxygen and SNF. Encouraged her to look at [facility] website to review local options with star ratings. CSW sent referral out to local options as well.   Expected Discharge Plan: Home/Self Care Barriers to Discharge: Continued Medical Work up  Expected Discharge Plan and Services     Post Acute Care Choice: NA Living arrangements for the past 2 months: Single Family Home                                        Social Determinants of Health (SDOH) Interventions SDOH Screenings   Food Insecurity: No Food Insecurity (01/27/2024)  Housing: Low Risk  (01/27/2024)  Transportation Needs: No Transportation Needs (01/27/2024)  Utilities: Not At Risk (01/27/2024)  Alcohol Screen: Low Risk  (08/14/2023)  Financial Resource Strain: Low Risk  (03/20/2023)   Received from [facility]  Social Connections: Socially Isolated (01/27/2024)  Stress: No Stress Concern Present (09/07/2020)   Received from [facility], [facility]  Tobacco Use: Low Risk  (01/26/2024)    Readmission Risk Interventions    01/29/2024   12:58 PM 09/12/2023   12:13 PM 08/15/2023    3:57 PM  Readmission Risk Prevention Plan  Transportation Screening Complete Complete Complete  PCP or Specialist Appt within 3-5 Days  Complete Complete  HRI or Home Care Consult  Complete Complete  Social Work Consult for Recovery Care Planning/Counseling  Complete Complete  Palliative Care Screening  Not Applicable Not Applicable  Medication Review Oceanographer) Complete Complete Complete  PCP or Specialist appointment within 3-5 days of discharge Complete    SW Recovery Care/Counseling Consult Complete    Palliative Care Screening Not Applicable    Skilled Nursing Facility Not Applicable

## 2024-02-04 NOTE — Plan of Care (Signed)
  Problem: Coping: Goal: Ability to adjust to condition or change in health will improve 02/04/2024 1546 by Kalvin Orf, RN Outcome: Progressing 02/04/2024 1545 by Kalvin Orf, RN Outcome: Progressing   Problem: Fluid Volume: Goal: Ability to maintain a balanced intake and output will improve 02/04/2024 1546 by Kalvin Orf, RN Outcome: Progressing 02/04/2024 1545 by Kalvin Orf, RN Outcome: Progressing   Problem: Health Behavior/Discharge Planning: Goal: Ability to identify and utilize available resources and services will improve 02/04/2024 1546 by Kalvin Orf, RN Outcome: Progressing 02/04/2024 1545 by Kalvin Orf, RN Outcome: Progressing

## 2024-02-04 NOTE — Progress Notes (Signed)
 SLP Cancellation Note  Patient Details Name: Tyler [name] MRN: [id number] DOB: [birth year]   Cancelled treatment:       Reason Eval/Treat Not Completed: Patient at procedure or test/unavailable (pt rescheduled for TEE today at ~Noon per NSG.  ST services will f/u tomorrow w/ tx/toleration of po diet.)    Darla Edward, MS, CCC-SLP Speech Language Pathologist Rehab Services; [facility] [phone] (ascom) Timo Hartwig 02/04/2024, 8:43 AM

## 2024-02-05 ENCOUNTER — Inpatient Hospital Stay

## 2024-02-05 ENCOUNTER — Encounter: Payer: Self-pay | Admitting: Internal Medicine

## 2024-02-05 DIAGNOSIS — J9611 Chronic respiratory failure with hypoxia: Secondary | ICD-10-CM

## 2024-02-05 DIAGNOSIS — R7881 Bacteremia: Secondary | ICD-10-CM | POA: Diagnosis not present

## 2024-02-05 DIAGNOSIS — L0390 Cellulitis, unspecified: Secondary | ICD-10-CM | POA: Diagnosis not present

## 2024-02-05 DIAGNOSIS — N179 Acute kidney failure, unspecified: Secondary | ICD-10-CM | POA: Diagnosis not present

## 2024-02-05 DIAGNOSIS — M25462 Effusion, left knee: Secondary | ICD-10-CM

## 2024-02-05 DIAGNOSIS — L8990 Pressure ulcer of unspecified site, unspecified stage: Secondary | ICD-10-CM | POA: Diagnosis not present

## 2024-02-05 DIAGNOSIS — I5023 Acute on chronic systolic (congestive) heart failure: Secondary | ICD-10-CM | POA: Diagnosis not present

## 2024-02-05 DIAGNOSIS — E1122 Type 2 diabetes mellitus with diabetic chronic kidney disease: Secondary | ICD-10-CM | POA: Diagnosis not present

## 2024-02-05 DIAGNOSIS — E11621 Type 2 diabetes mellitus with foot ulcer: Secondary | ICD-10-CM

## 2024-02-05 DIAGNOSIS — B951 Streptococcus, group B, as the cause of diseases classified elsewhere: Secondary | ICD-10-CM | POA: Diagnosis not present

## 2024-02-05 DIAGNOSIS — I214 Non-ST elevation (NSTEMI) myocardial infarction: Secondary | ICD-10-CM | POA: Diagnosis not present

## 2024-02-05 LAB — BASIC METABOLIC PANEL WITH GFR
Anion gap: 8 (ref 5–15)
BUN: 62 mg/dL — ABNORMAL HIGH (ref 8–23)
CO2: 21 mmol/L — ABNORMAL LOW (ref 22–32)
Calcium: 8.8 mg/dL — ABNORMAL LOW (ref 8.9–10.3)
Chloride: 111 mmol/L (ref 98–111)
Creatinine, Ser: 1.21 mg/dL (ref 0.61–1.24)
GFR, Estimated: >60 mL/min (ref >60)
Glucose, Bld: 205 mg/dL — ABNORMAL HIGH (ref 70–99)
Potassium: 3.8 mmol/L (ref 3.5–5.1)
Sodium: 140 mmol/L (ref 135–145)

## 2024-02-05 LAB — CBC
HCT: 35.4 % — ABNORMAL LOW (ref 39.0–52.0)
Hemoglobin: 11.1 g/dL — ABNORMAL LOW (ref 13.0–17.0)
MCH: 26.8 pg (ref 26.0–34.0)
MCHC: 31.4 g/dL (ref 30.0–36.0)
MCV: 85.5 fL (ref 80.0–100.0)
Platelets: 273 10*3/uL (ref 150–400)
RBC: 4.14 MIL/uL — ABNORMAL LOW (ref 4.22–5.81)
RDW: 16.3 % — ABNORMAL HIGH (ref 11.5–15.5)
WBC: 10.8 10*3/uL — ABNORMAL HIGH (ref 4.0–10.5)
nRBC: 0 % (ref 0.0–0.2)

## 2024-02-05 LAB — HEPARIN LEVEL (UNFRACTIONATED): Heparin Unfractionated: 0.48 [IU]/mL (ref 0.30–0.70)

## 2024-02-05 LAB — GLUCOSE, CAPILLARY
Glucose-Capillary: 207 mg/dL — ABNORMAL HIGH (ref 70–99)
Glucose-Capillary: 272 mg/dL — ABNORMAL HIGH (ref 70–99)
Glucose-Capillary: 255 mg/dL — ABNORMAL HIGH (ref 70–99)
Glucose-Capillary: 180 mg/dL — ABNORMAL HIGH (ref 70–99)

## 2024-02-05 LAB — ECHO TEE

## 2024-02-05 MED ORDER — LOSARTAN POTASSIUM 25 MG PO TABS
12.5000 mg | ORAL_TABLET | Freq: Every day | ORAL | Status: DC
  Administered 2024-02-05 – 2024-02-06 (×2): 12.5 mg via ORAL
  Filled 2024-02-05 (×2): qty 1

## 2024-02-05 NOTE — Progress Notes (Addendum)
 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  Vital signs in the ED reveal tachycardia.  EKG reveals A-fib RVR with PVCs and LBBB.  CT head was negative for any acute intracranial abnormality.  Chest x-ray with cardiomegaly and mild central congestion.  Patient was started on heparin  infusion and cardiology was consulted.  Subsequently patient had worsening leukocytosis with a nonanion gap metabolic acidosis and elevated BNP over 4000.  Troponin also continued to increase.  Cardiology initiated the patient on IV Bumex  and ordered an echocardiogram.  On 5/6 a rapid response was called and patient became increasingly tachycardic and tachypneic and was found to be febrile at 102.9.  He was also found to have worsening right lower extremity erythema with signs of venous congestion and full-thickness diabetic foot ulcers involving the left foot.  Blood cultures were obtained and patient was initially on broad-spectrum antibiotics.  Blood cultures grew GBS and infectious disease was consulted.  Patient was planned for TEE but it was canceled on 5/9 due to altered mentation.  Patient had TEE on 5/13 which was negative for endocarditis.  Patient remained on heparin  drip until 5/14 at which time cardiology discontinued the medication. Patient's foot wound grew Enterococcus and MRSA believed to be colonization.  IV antibiotics were changed to IV vancomycin  on 5/11.  Patient underwent TEE on 5/13  which was negative for vegetation.  Antibiotics were de-escalated back to cefazolin   Subjective: On evaluation today patient is very drowsy.  He is arousable but speech is unintelligible.  He denies any pain when asked  Had extensive conversation with his daughter, Tyler [name], who reports that the patient does still appear to be having pain. [name] is not likely to be forthright about his pain unless specifically asked.  Objective: Vitals:   02/05/24 0000 02/05/24 0400 02/05/24 0500 02/05/24 0818  BP: 128/61 117/81  120/63  Pulse: 82 98  98  Resp: 19 (!) 21  18  Temp: 98.5 F (36.9 C) 98.3 F (36.8 C)  98.1 F (36.7 C)  TempSrc: Oral Oral    SpO2: 93%   90%  Weight:   107.6 kg   Height:        Intake/Output Summary (Last 24 hours) at 02/05/2024 0945 Last data filed at 02/05/2024 0544 Gross per 24 hour  Intake 903.48 ml  Output 1750 ml  Net -846.52 ml   Filed Weights   02/01/24 0500 02/04/24 0500 02/05/24 0500  Weight: 100.7 kg 108 kg 107.6 kg    Examination: General exam: Calm, uncomfortable.  Mumbling unintelligibly Respiratory system: No work of breathing, symmetric chest wall expansion Cardiovascular system: S1 & S2 heard, RRR.  Gastrointestinal system: Abdomen is nondistended, soft and nontender.  Neuro: Drowsy but arousable.  Speech is unintelligible at times  Assessment & Plan:  Principal Problem:   NSTEMI (non-ST elevated myocardial infarction) (HCC) Active Problems:   Acute kidney injury superimposed on stage 3a chronic kidney disease (HCC) - baseline scr 1.3   Septicemia due to group B Streptococcus (HCC)   Sepsis due to  cellulitis (HCC)   Acute on chronic systolic CHF (congestive heart failure) (HCC)   Type II diabetes mellitus with renal manifestations (HCC)   Dyslipidemia   Essential hypertension   Pressure injury of skin   Pseudogout of left knee   Chronic respiratory failure with hypoxia (HCC)   Effusion of left knee   Bacteremia due to group B  Streptococcus    Sepsis - Septic criteria met with tachycardia, fever, leukocytosis.  Source: Bacteremia  Group B strep bacteremia - Multiple attempts for TEE during this admission.  Completed 5/13.  Negative for vegetations. - ID following - Continue with Rocephin  for now - Repeat blood cultures negative - Suspected source may be foot wounds, will involve podiatry  Metabolic Encephalopathy -- Persistently altered mentation likely multifactorial: Suspect some role of hospital delirium complicated by sepsis and ongoing infection - At baseline patient holds a 10-hour shift at [employer] - He was independently managing all ADLs and health care prior to this admission --Head CT negative on arrival --Consider bMRI if persistent  Foot wounds - Foot wounds growing MRSA and Enterococcus.  ID consulted and believes this is colonization - Ceftriaxone  only for now - Patient has been following closely with podiatry in the clinic - Have reengaged podiatry this admission, appreciate any recommendations - Per my conversation with the patient's daughter, Tyler [name], doubt the patient would consent to amputation if recommended  NSTEMI - Troponins elevated and trending 69 > 105 > 326 > 714 > 1300 > 1800.  -- Has been on heparin  drip, cardiology discontinued today. - Patient does require ischemic evaluation though it appears cardiology is planning to do this as an outpatient. - For now we will continue with aspirin  and statin - Continue on ARB and beta-blocker - Continue titrating diuretics - TEE negative for endocarditis  Acute on chronic heart failure reduced EF - Echo this admission with EF 25 to 30%, global hypokinesis, moderate RV dysfunction, severely elevated PASP, eccentric moderate to severe MR, low-flow low gradient mild to moderate aortic stenosis - Heart failure team was managing, has since signed off - Cardiology still following - Continue with Bumex  daily, titrate as needed -  Continue to follow strict I's and O's - Has had net output of 13 L since admission  Pacemaker status Atrial fibrillation - Medtronic dual pacemaker placed 08/2020 - Continue current medications with beta-blocker - Patient was previously on Eliquis  though this was discontinued in 2023 due to hematuria.  Cardiology does recommend anticoagulation but patient has historically deferred due to bleeding episodes - EP has been consulted.  TEE without evidence of lead vegetation. - Keep potassium above 4, mag above 2  CKD stage IIIa - Baseline creatinine appears to be 1.3 - Currently creatinine is close to that - Will continue monitoring renal function closely - Managing diuresis as above  Pseudogout left knee - 5/9 arthrocentesis with orthopedic surgery.  Consistent with pseudogout. --Cont pain management  Hypertension - Continue following closely and titrate as needed  Dyslipidemia - Continue statin  Type 2 diabetes with renal manifestations - Continue sliding scale insulin , basal/bolus.  Titrate as needed - Home dose glipizide  and metformin  on hold -- A1c 6.9%  Chronic Foley Catheter -- On chart review it appears Foley catheter last placed 11/13/2023.  Have discussed with bedside RN and requested for replacement.  Poor prognosis - In light of patient's multiple comorbidities, active infections, poorly healing foot ulcers, CKD, decompensated heart failure patient overall has poor prognosis.  I do have  some hope that patient can regain some of his mental status as it appears he was functional for most ADLs prior to arrival. At minimum suspect patient will require SNF placement. Palliative care has been consulted and is assisting in GOC conversations.  Also had extensive discussion with the patient's daughter, Tyler [name] on the phone today.  We will continue to reevaluate daily  *Addendum: Patient is experiencing some intermittent tachypnea.  Maintains O2 sats above 95%.  Chest x-ray  obtained, which reveals some congestion and cannot rule out left-sided infiltrates.  Findings do not appear concerning enough to explain tachypnea.  Tachypnea may have other sources including anxiety, pain.  Continue to monitor closely on telemetry.  Will continue to monitor respiratory status.  Currently on 2 L for comfort.  Continue IV diuresis as above.  If patient becomes tachycardic can consider CT chest, presently heart rate 80s.  DVT prophylaxis: SCDs   Code Status: Limited: Do not attempt resuscitation (DNR) -DNR-LIMITED -Do Not Intubate/DNI  Disposition:  Eventual SNF, not medically ready yet.  Consultants:  Treatment Team:  Consulting Physician: Venus Ginsberg, MD  Procedures:    Antimicrobials:  Anti-infectives (From admission, onward)    Start     Dose/Rate Route Frequency Ordered Stop   02/04/24 1800  cefTRIAXone  (ROCEPHIN ) 2 g in sodium chloride  0.9 % 100 mL IVPB        2 g 200 mL/hr over 30 Minutes Intravenous Every 24 hours 02/03/24 2329     02/02/24 1800  vancomycin  (VANCOREADY) IVPB 1750 mg/350 mL  Status:  Discontinued        1,750 mg 175 mL/hr over 120 Minutes Intravenous Every 24 hours 02/01/24 1729 02/03/24 2328   02/01/24 2015  vancomycin  (VANCOREADY) IVPB 500 mg/100 mL       Placed in "Followed by" Linked Group   500 mg 100 mL/hr over 60 Minutes Intravenous  Once 02/01/24 1727 02/01/24 2245   02/01/24 1815  vancomycin  (VANCOREADY) IVPB 1750 mg/350 mL       Placed in "Followed by" Linked Group   1,750 mg 175 mL/hr over 120 Minutes Intravenous  Once 02/01/24 1727 02/01/24 2017   01/28/24 1600  vancomycin  (VANCOREADY) IVPB 2000 mg/400 mL  Status:  Discontinued        2,000 mg 200 mL/hr over 120 Minutes Intravenous Every 24 hours 01/27/24 2009 01/28/24 0844   01/28/24 1600  cefTRIAXone  (ROCEPHIN ) 2 g in sodium chloride  0.9 % 100 mL IVPB  Status:  Discontinued        2 g 200 mL/hr over 30 Minutes Intravenous Daily 01/28/24 0844 02/01/24 1728   01/28/24 0800   ceFEPIme  (MAXIPIME ) 2 g in sodium chloride  0.9 % 100 mL IVPB  Status:  Discontinued        2 g 200 mL/hr over 30 Minutes Intravenous Every 12 hours 01/27/24 1950 01/28/24 0844   01/27/24 2030  ceFEPIme  (MAXIPIME ) 2 g in sodium chloride  0.9 % 100 mL IVPB        2 g 200 mL/hr over 30 Minutes Intravenous  Once 01/27/24 1942 01/27/24 2242   01/27/24 2030  metroNIDAZOLE  (FLAGYL ) IVPB 500 mg  Status:  Discontinued        500 mg 100 mL/hr over 60 Minutes Intravenous Every 12 hours 01/27/24 1942 01/28/24 0844   01/27/24 2030  vancomycin  (VANCOCIN ) IVPB 1000 mg/200 mL premix  Status:  Discontinued        1,000 mg 200 mL/hr over 60 Minutes Intravenous  Once 01/27/24 1942 01/27/24 1948  01/27/24 1945  vancomycin  (VANCOREADY) IVPB 2000 mg/400 mL        2,000 mg 200 mL/hr over 120 Minutes Intravenous  Once 01/27/24 1948 01/27/24 2304   01/26/24 2300  doxycycline  (VIBRA -TABS) tablet 100 mg  Status:  Discontinued        100 mg Oral 2 times daily 01/26/24 2254 01/26/24 2346       Data Reviewed: I have personally reviewed following labs and imaging studies CBC: Recent Labs  Lab 02/01/24 0455 02/02/24 0400 02/03/24 0351 02/04/24 0618 02/05/24 0233  WBC 9.4 9.4 11.2* 11.1* 10.8*  HGB 10.6* 11.1* 11.2* 11.5* 11.1*  HCT 33.9* 35.4* 35.9* 37.7* 35.4*  MCV 85.8 86.3 86.9 86.7 85.5  PLT 173 216 234 265 273   Basic Metabolic Panel: Recent Labs  Lab 01/30/24 0248 01/31/24 0612 02/01/24 0455 02/02/24 0400 02/03/24 0351 02/04/24 0618 02/05/24 0233  NA 136   < > 139 139 139 141 140  K 3.3*   < > 3.5 3.8 3.8 3.8 3.8  CL 102   < > 103 104 106 108 111  CO2 22   < > 24 21* 21* 21* 21*  GLUCOSE 176*   < > 165* 171* 189* 197* 205*  BUN 98*   < > 86* 74* 67* 63* 62*  CREATININE 2.44*   < > 1.53* 1.41* 1.46* 1.34* 1.21  CALCIUM  8.5*   < > 9.0 8.8* 8.9 9.3 8.8*  MG 2.4  --   --   --  2.5*  --   --    < > = values in this interval not displayed.   GFR: Estimated Creatinine Clearance: 64 mL/min  (by C-G formula based on SCr of 1.21 mg/dL). Liver Function Tests: No results for input(s): "AST", "ALT", "ALKPHOS", "BILITOT", "PROT", "ALBUMIN" in the last 168 hours. CBG: Recent Labs  Lab 02/03/24 2035 02/04/24 0809 02/04/24 1530 02/04/24 2226 02/05/24 0818  GLUCAP 216* 193* 262* 211* 207*    Recent Results (from the past 240 hours)  Culture, blood (x 2)     Status: Abnormal   Collection Time: 01/27/24  8:43 PM   Specimen: BLOOD  Result Value Ref Range Status   Specimen Description   Final    BLOOD BLOOD LEFT ARM LAC Performed at [facility], [address] [place], [place] [address]    Special Requests   Final    BOTTLES DRAWN AEROBIC AND ANAEROBIC Blood Culture adequate volume Performed at [facility], [address] [place], [place] [address]    Culture  Setup Time   Final    GRAM POSITIVE COCCI IN BOTH AEROBIC AND ANAEROBIC BOTTLES CRITICAL RESULT CALLED TO, READ BACK BY AND VERIFIED WITH: TREY GREENWOOD 01/28/24 0811 MW GRAM STAIN REVIEWED-AGREE WITH RESULT DRT    Culture (A)  Final    GROUP B STREP(S.AGALACTIAE)ISOLATED SUSCEPTIBILITIES PERFORMED ON PREVIOUS CULTURE WITHIN THE LAST 5 DAYS. Performed at [facility], [address] [place], [place] [address]    Report Status 01/30/2024 FINAL  Final  Culture, blood (x 2)     Status: Abnormal   Collection Time: 01/27/24  8:49 PM   Specimen: BLOOD  Result Value Ref Range Status   Specimen Description   Final    BLOOD BLOOD LEFT HAND [facility] Performed at [facility], [address] [place], [place] [address]    Special Requests   Final    BOTTLES DRAWN AEROBIC AND ANAEROBIC Blood Culture adequate volume Performed at [facility], [address] [place], [place] [address]    Culture  Setup Time   Final    GRAM POSITIVE COCCI IN BOTH AEROBIC AND ANAEROBIC BOTTLES Organism ID to follow CRITICAL RESULT CALLED TO, READ BACK BY AND VERIFIED WITH: TREY GREENWOOD  01/28/24 0811 MW GRAM STAIN REVIEWED-AGREE WITH RESULT DRT Performed at [facility], [address], [place], [place] [address]    Culture GROUP B STREP(S.AGALACTIAE)ISOLATED (A)  Final   Report Status 01/30/2024 FINAL  Final   Organism ID, Bacteria GROUP B STREP(S.AGALACTIAE)ISOLATED  Final      Susceptibility   Group b strep(s.agalactiae)isolated - MIC*    CLINDAMYCIN RESISTANT Resistant     AMPICILLIN <=0.25 SENSITIVE Sensitive     ERYTHROMYCIN >=8 RESISTANT Resistant     VANCOMYCIN  0.5 SENSITIVE Sensitive     CEFTRIAXONE  <=0.12 SENSITIVE Sensitive     LEVOFLOXACIN  0.5 SENSITIVE Sensitive     PENICILLIN <=0.06 SENSITIVE Sensitive     * GROUP B STREP(S.AGALACTIAE)ISOLATED  Blood Culture ID Panel (Reflexed)     Status: Abnormal   Collection Time: 01/27/24  8:49 PM  Result Value Ref Range Status   Enterococcus faecalis NOT DETECTED NOT DETECTED Final   Enterococcus Faecium NOT DETECTED NOT DETECTED Final   Listeria monocytogenes NOT DETECTED NOT DETECTED Final   Staphylococcus species NOT DETECTED NOT DETECTED Final   Staphylococcus aureus (BCID) NOT DETECTED NOT DETECTED Final   Staphylococcus epidermidis NOT DETECTED NOT DETECTED Final   Staphylococcus lugdunensis NOT DETECTED NOT DETECTED Final   Streptococcus species DETECTED (A) NOT DETECTED Final    Comment: CRITICAL RESULT CALLED TO, READ BACK BY AND VERIFIED WITH: TREY GREENWOOD 01/28/24 0811 MW    Streptococcus agalactiae DETECTED (A) NOT DETECTED Final    Comment: CRITICAL RESULT CALLED TO, READ BACK BY AND VERIFIED WITH: TREY GREENWOOD 01/28/24 0811 MW    Streptococcus pneumoniae NOT DETECTED NOT DETECTED Final   Streptococcus pyogenes NOT DETECTED NOT DETECTED Final   A.calcoaceticus-baumannii NOT DETECTED NOT DETECTED Final   Bacteroides fragilis NOT DETECTED NOT DETECTED Final   Enterobacterales NOT DETECTED NOT DETECTED Final   Enterobacter cloacae complex NOT DETECTED NOT DETECTED Final   Escherichia coli NOT  DETECTED NOT DETECTED Final   Klebsiella aerogenes NOT DETECTED NOT DETECTED Final   Klebsiella oxytoca NOT DETECTED NOT DETECTED Final   Klebsiella pneumoniae NOT DETECTED NOT DETECTED Final   Proteus species NOT DETECTED NOT DETECTED Final   Salmonella species NOT DETECTED NOT DETECTED Final   Serratia marcescens NOT DETECTED NOT DETECTED Final   Haemophilus influenzae NOT DETECTED NOT DETECTED Final   Neisseria meningitidis NOT DETECTED NOT DETECTED Final   Pseudomonas aeruginosa NOT DETECTED NOT DETECTED Final   Stenotrophomonas maltophilia NOT DETECTED NOT DETECTED Final   Candida albicans NOT DETECTED NOT DETECTED Final   Candida auris NOT DETECTED NOT DETECTED Final   Candida glabrata NOT DETECTED NOT DETECTED Final   Candida krusei NOT DETECTED NOT DETECTED Final   Candida parapsilosis NOT DETECTED NOT DETECTED Final   Candida tropicalis NOT DETECTED NOT DETECTED Final   Cryptococcus neoformans/gattii NOT DETECTED NOT DETECTED Final    Comment: Performed at [facility], [address] [place], [place] [address]  MRSA Next Gen by PCR, Nasal     Status: None   Collection Time: 01/28/24  1:00 AM   Specimen: Nasal Mucosa; Nasal Swab  Result Value Ref Range Status   MRSA by PCR Next Gen NOT DETECTED NOT DETECTED Final  Comment: (NOTE) The GeneXpert MRSA Assay (FDA approved for NASAL specimens only), is one component of a comprehensive MRSA colonization surveillance program. It is not intended to diagnose MRSA infection nor to guide or monitor treatment for MRSA infections. Test performance is not FDA approved in patients less than 18 years old. Performed at [facility], [address] [place], [place] [address]   Aerobic Culture w Gram Stain (superficial specimen)     Status: None   Collection Time: 01/29/24  6:35 AM   Specimen: Foot; Wound  Result Value Ref Range Status   Specimen Description   Final    FOOT RIGHT Performed at [facility], [address], [place], [place] [address]    Special Requests   Final    NONE Performed at [facility], [address] [place], [place] [address]    Gram Stain NO WBC SEEN RARE GRAM POSITIVE COCCI IN PAIRS   Final   Culture   Final    RARE STAPHYLOCOCCUS AUREUS SUSCEPTIBILITIES PERFORMED ON PREVIOUS CULTURE WITHIN THE LAST 5 DAYS. ABUNDANT CORYNEBACTERIUM STRIATUM Standardized susceptibility testing for this organism is not available. Performed at [facility], [address] [place], [place] [address]    Report Status 02/01/2024 FINAL  Final  Aerobic Culture w Gram Stain (superficial specimen)     Status: None   Collection Time: 01/29/24  6:37 AM   Specimen: Foot; Wound  Result Value Ref Range Status   Specimen Description   Final    FOOT LEFT Performed at [facility], [address] [place], [place] [address]    Special Requests   Final    NONE Performed at [name], [address] [place], [place] [address]    Gram Stain   Final    NO WBC SEEN RARE GRAM POSITIVE COCCI IN PAIRS Performed at [facility], [address] [place], [place] [address]    Culture   Final    RARE METHICILLIN RESISTANT STAPHYLOCOCCUS AUREUS RARE ENTEROCOCCUS FAECALIS RARE GROUP B STREP(S.AGALACTIAE)ISOLATED TESTING AGAINST S. AGALACTIAE NOT ROUTINELY PERFORMED DUE TO PREDICTABILITY OF AMP/PEN/VAN SUSCEPTIBILITY. RARE FUNGUS (MOLD) ISOLATED, PROBABLE CONTAMINANT/COLONIZER (SAPROPHYTE). CONTACT MICROBIOLOGY IF FURTHER IDENTIFICATION REQUIRED [phone].    Report Status 02/01/2024 FINAL  Final   Organism ID, Bacteria METHICILLIN RESISTANT STAPHYLOCOCCUS AUREUS  Final   Organism ID, Bacteria ENTEROCOCCUS FAECALIS  Final      Susceptibility   Enterococcus faecalis - MIC*    AMPICILLIN <=2 SENSITIVE Sensitive     VANCOMYCIN  1 SENSITIVE Sensitive     GENTAMICIN  SYNERGY SENSITIVE Sensitive     * RARE ENTEROCOCCUS FAECALIS   Methicillin  resistant staphylococcus aureus - MIC*    CIPROFLOXACIN  >=8 RESISTANT Resistant     ERYTHROMYCIN >=8 RESISTANT Resistant     GENTAMICIN  <=0.5 SENSITIVE Sensitive     OXACILLIN >=4 RESISTANT Resistant     TETRACYCLINE <=1 SENSITIVE Sensitive     VANCOMYCIN  1 SENSITIVE Sensitive     TRIMETH /SULFA  <=10 SENSITIVE Sensitive     CLINDAMYCIN <=0.25 SENSITIVE Sensitive     RIFAMPIN <=0.5 SENSITIVE Sensitive     Inducible Clindamycin NEGATIVE Sensitive     LINEZOLID 2 SENSITIVE Sensitive     * RARE METHICILLIN RESISTANT STAPHYLOCOCCUS AUREUS  Culture, blood (Routine X 2) w Reflex to ID Panel     Status: None   Collection Time: 01/29/24  6:11 PM   Specimen: BLOOD  Result Value Ref Range Status   Specimen Description  BLOOD BLOOD RIGHT HAND  Final   Special Requests   Final    BOTTLES DRAWN AEROBIC AND ANAEROBIC Blood Culture adequate volume   Culture   Final    NO GROWTH 5 DAYS Performed at [facility], [address] [place], [place] [address]    Report Status 02/03/2024 FINAL  Final  Culture, blood (Routine X 2) w Reflex to ID Panel     Status: None   Collection Time: 01/29/24  7:42 PM   Specimen: BLOOD  Result Value Ref Range Status   Specimen Description BLOOD BLOOD RIGHT HAND  Final   Special Requests   Final    BOTTLES DRAWN AEROBIC AND ANAEROBIC Blood Culture adequate volume   Culture   Final    NO GROWTH 5 DAYS Performed at [facility], [address] [place], [place] [address]    Report Status 02/03/2024 FINAL  Final  Body fluid culture w Gram Stain     Status: None   Collection Time: 01/30/24  4:00 PM   Specimen: Synovium; Body Fluid  Result Value Ref Range Status   Specimen Description   Final    SYNOVIAL Performed at [facility], [address] [place], [place] [address]    Special Requests   Final    L KNEE Performed at [facility], [address] [place], [place] [address]    Gram Stain   Final    MODERATE WBC  PRESENT, PREDOMINANTLY PMN NO ORGANISMS SEEN    Culture   Final    NO GROWTH 3 DAYS Performed at [facility], [address] [place], [place] [address]    Report Status 02/04/2024 FINAL  Final     Radiology Studies: No results found.  Scheduled Meds:  vitamin C   500 mg Oral BID   aspirin   81 mg Oral Daily   atorvastatin   80 mg Oral q1800   bumetanide   2 mg Oral Daily   Chlorhexidine  Gluconate Cloth  6 each Topical Daily   feeding supplement (NEPRO CARB STEADY)  237 mL Oral BID BM   gentamicin  cream  1 Application Topical BID   insulin  aspart  0-15 Units Subcutaneous TID WC   insulin  aspart  0-5 Units Subcutaneous QHS   insulin  glargine-yfgn  5 Units Subcutaneous QHS   losartan   12.5 mg Oral Daily   metoprolol  succinate  25 mg Oral Daily   multivitamin with minerals  1 tablet Oral Daily   silver  sulfADIAZINE    Topical Daily   Continuous Infusions:  cefTRIAXone  (ROCEPHIN )  IV Stopped (02/04/24 1741)     LOS: 8 days  MDM: Patient is high risk for one or more organ failure.  They necessitate ongoing hospitalization for continued IV therapies and subsequent lab monitoring. Total time spent interpreting labs and vitals, reviewing the medical record, coordinating care amongst consultants and care team members, directly assessing and discussing care with the patient and/or family: 55 min  Sariyah Corcino, DO [facility]  To contact the attending physician between 7A-7P please use Epic Chat. To contact the covering physician during after hours 7P-7A, please review Amion.  02/05/2024, 9:45 AM   *This document has been created with the assistance of dictation software. Please excuse typographical errors. *

## 2024-02-05 NOTE — Progress Notes (Signed)
 ANTICOAGULATION CONSULT NOTE  Pharmacy Consult for heparin  infusion Indication: Atrial fibrillation  Allergies  Allergen Reactions   Quinolones Other (See Comments)    Patient has aortic aneurysm - fluoroquinolones are contraindicated due to risk of aortic rupture.   Eliquis  [Apixaban ]     Dizziness  and vision change   Spironolactone  Other (See Comments)    dizziness    Patient Measurements: Height: 6\' 6"  (198.1 cm) Weight: 108 kg (238 lb 1.6 oz) IBW/kg (Calculated) : 91.4 Heparin  Dosing Weight: 115.5 kg  Vital Signs: Temp: 98.3 F (36.8 C) (05/14 0400) Temp Source: Oral (05/14 0400) BP: 117/81 (05/14 0400) Pulse Rate: 98 (05/14 0400)  Labs: Recent Labs    02/03/24 0351 02/04/24 0618 02/05/24 0233  HGB 11.2* 11.5* 11.1*  HCT 35.9* 37.7* 35.4*  PLT 234 265 273  HEPARINUNFRC 0.35 0.48 0.48  CREATININE 1.46* 1.34* 1.21    Estimated Creatinine Clearance: 64 mL/min (by C-G formula based on SCr of 1.21 mg/dL).   Medical History: Past Medical History:  Diagnosis Date   (HFpEF) heart failure with preserved ejection fraction (HCC) 03/01/2020   a.) TTE 03/01/2020: EF 55-60%, mod MAC, mod AoV sclerosis, triv AR, mild TR, mod MR, RVSP 50-59; b.) TTE 09/10/2020: EF 55-60%, mod MAC, mod AoV sclerosis, mild TR, 3+ MR, RVSP 50-59; c.) TTE 09/26/2020: EF 55-60%, mild LA dil, triv PR, mild MR/TR, RVSP 37-49   Adenoma of left adrenal gland    Anemia    Arthritis    Atrial fibrillation and flutter (HCC)    a.) CHA2DS2VASc = 5 (age x2, HFpEF, HTN, T2DM);  b.) s/p CTI ablation 09/07/2020; c.) rate/rhythm maintained on oral carvedilol ; not on chronic anticoagulation therapy   CAD (coronary artery disease)    Cardiomegaly    CKD (chronic kidney disease), stage III (HCC)    DOE (dyspnea on exertion)    Drug-induced bradycardia    Gangrene of toe of left foot (HCC)    a.) 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   H/O active rheumatic fever 08/22/2020   Hepatosplenomegaly     History of bilateral cataract extraction    HLD (hyperlipidemia)    Hypertension    Long term current use of aspirin     Lymphedema of both lower extremities    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)    a.) s/p amputation 11/04/2022   Peripheral vascular disease (HCC)    Pleural effusion on right 09/09/2020   a.) s/p RIGHT thoracentesis with 2180 cc yield   Pneumonia    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   Sick sinus syndrome [facility])    a.) s/p MDT PPM placement 09/15/2020   T2DM (type 2 diabetes mellitus) (HCC)    Urinary retention    chronic, with indwelling Foley catheter and plans for a suprapubic   Wears dentures    full upper    Assessment: Pt is a 78 yo male presenting to ED complaining of feeling of  dizziness and confusion. Peak trop of 1.8K. No DOAC PTA. PMH: HFpEF, OA, afib/flutter, pacemaker, CAD, CKD 3, HLD, HTN, PAD, DM. Of note, patient has previously refused oral anticoagulants.    5/5 0844 HL 0.27    SUBtherapeutic 5/5 1826 HL 0.39    Therapeutic x 1 5/6 0401 HL 0.20    SUBtherapeutic  5/6 1340 HL 0.24    SUBtherapeutic 5/6 2256 HL 0.41    Therapeutic x 1  5/7 0701 HL 0.45    Therapeutic x 2 5/8 0248 HL 0.44    Therapeutic x 3  5/9 0743 HL 0.51    Therapeutic x 4 5/10 0455 HL 0.37  Therapeutic X 5 5/11 0400 HL 0.38  Therapeutic x 6 5/12 0351 HL 0.35  Therapeutic x 7 5/13 0618 HL 0.48  Therapeutic x 8 5/14 0233 HL 0.48  Therapeutic x  9  Goal of Therapy:  Heparin  level 0.3-0.7 units/ml Monitor platelets by anticoagulation protocol: Yes   Plan:  5/14 @ 0233 = HL 0.48  Therapeutic x 9 - will continue pt on current rate and recheck HL on 5/15 with AM labs - CBC daily  Coretta Dexter, PharmD, [facility] 02/05/2024 4:45 AM

## 2024-02-05 NOTE — Anesthesia Preprocedure Evaluation (Signed)
 Anesthesia Evaluation  Patient identified by MRN, date of birth, ID band Patient awake    Reviewed: Allergy & Precautions, NPO status , Patient's Chart, lab work & pertinent test results  History of Anesthesia Complications Negative for: history of anesthetic complications  Airway Mallampati: III  TM Distance: >3 FB Neck ROM: Full    Dental  (+) Edentulous Upper, Poor Dentition, Chipped   Pulmonary neg sleep apnea, pneumonia, unresolved, neg COPD, Patient abstained from smoking.Not current smoker  Productive cough for two weeks. Potential community acquired pneumonia, on antibiotics now inpatient. Occasional cough with me in preop. 100% SpO2 on room air. No respiratory distress   + rhonchi  + decreased breath sounds      Cardiovascular Exercise Tolerance: Poor hypertension, Pt. on medications + CAD, + Peripheral Vascular Disease, +CHF and + DOE  (-) Past MI + dysrhythmias Atrial Fibrillation + pacemaker + Valvular Problems/Murmurs MR  Rhythm:Regular Rate:Normal - Systolic murmurs Admitted for CHF exacerbation. Since has been diuresed, net -3.4L. Patient says his breathing feels better compared to admission.  TTE 2024: 1. Left ventricular ejection fraction, by estimation, is 30 to 35%. The  left ventricle has moderately decreased function. The left ventricle  demonstrates regional wall motion abnormalities (see scoring  diagram/findings for description). Indeterminate  diastolic filling due to E-A fusion.   2. Right ventricular systolic function is normal. The right ventricular  size is normal.   3. The mitral valve is normal in structure. Moderate mitral valve  regurgitation. No evidence of mitral stenosis.   4. The aortic valve is normal in structure. Aortic valve regurgitation is  not visualized. No aortic stenosis is present.   5. The inferior vena cava is normal in size with greater than 50%  respiratory variability,  suggesting right atrial pressure of 3 mmHg.     Neuro/Psych negative neurological ROS  negative psych ROS   GI/Hepatic ,neg GERD  ,,(+)     (-) substance abuse    Endo/Other  diabetes    Renal/GU      Musculoskeletal   Abdominal   Peds  Hematology  (+) Blood dyscrasia, anemia   Anesthesia Other Findings Past Medical History: 03/01/2020: (HFpEF) heart failure with preserved ejection fraction  (HCC)     Comment:  a.) TTE 03/01/2020: EF 55-60%, mod MAC, mod AoV               sclerosis, triv AR, mild TR, mod MR, RVSP 50-59; b.) TTE               09/10/2020: EF 55-60%, mod MAC, mod AoV sclerosis, mild               TR, 3+ MR, RVSP 50-59; c.) TTE 09/26/2020: EF 55-60%,               mild LA dil, triv PR, mild MR/TR, RVSP 37-49 No date: Adenoma of left adrenal gland No date: Anemia No date: Arthritis No date: Atrial fibrillation and flutter (HCC)     Comment:  a.) CHA2DS2VASc = 5 (age x2, HFpEF, HTN, T2DM);  b.) s/p              CTI ablation 09/07/2020; c.) rate/rhythm maintained on               oral carvedilol ; not on chronic anticoagulation therapy No date: CAD (coronary artery disease) No date: Cardiomegaly No date: CKD (chronic kidney disease), stage III (HCC) No date: DOE (dyspnea on exertion) No date: Drug-induced bradycardia  No date: Gangrene of toe of left foot (HCC)     Comment:  a.) 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 No date: Hepatosplenomegaly No date: History of bilateral cataract extraction No date: HLD (hyperlipidemia) No date: Hypertension No date: Long term current use of aspirin  No date: Lymphedema of both lower extremities No date: 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)     Comment:  a.) s/p amputation 11/04/2022 No date: Peripheral vascular disease (HCC) 09/09/2020: Pleural effusion on right     Comment:  a.) s/p RIGHT thoracentesis with 2180 cc yield No date: Pneumonia 09/10/2020: Presence of permanent cardiac pacemaker      Comment:  a.) TVP placement 09/10/2020 due to intermittent CHB in               setting of urosepsis; b.) s/p PPM placement 09/15/2020:               MDT Azure XT DR (SN: [id number]) 03/01/2020: Pulmonary hypertension (HCC)     Comment:  a.) TTE: 03/01/2020: RVSP 50-59; b.) TTE 09/10/2020:               RVSP 50-59; c.) TTE 09/26/2020: RVSP 37-49 No date: RA (rheumatoid arthritis) (HCC) No date: Rheumatic fever 09/10/2020: Sepsis (HCC)     Comment:  a.) urosepsis --> BC x 2 sets and UC all grew out               significant Proteus mirabilis; admitted to [facility] 09/07/2020 - 09/27/2020. No date: Sick sinus syndrome [facility])     Comment:  a.) s/p MDT PPM placement 09/15/2020 No date: T2DM (type 2 diabetes mellitus) (HCC) No date: Urinary retention     Comment:  chronic, with indwelling Foley catheter and plans for a               suprapubic No date: Wears dentures     Comment:  full upper  Reproductive/Obstetrics                             Anesthesia Physical Anesthesia Plan  ASA: 3  Anesthesia Plan: General   Post-op Pain Management: Minimal or no pain anticipated   Induction: Intravenous  PONV Risk Score and Plan: 3 and Propofol  infusion, TIVA and Ondansetron   Airway Management Planned: Nasal Cannula  Additional Equipment: None  Intra-op Plan:   Post-operative Plan:   Informed Consent: I have reviewed the patients History and Physical, chart, labs and discussed the procedure including the risks, benefits and alternatives for the proposed anesthesia with the patient or authorized representative who has indicated his/her understanding and acceptance.     Dental advisory given  Plan Discussed with: CRNA and Surgeon  Anesthesia Plan Comments: (Discussed risks of anesthesia with patient, including possibility of difficulty with spontaneous ventilation under anesthesia necessitating airway intervention, PONV, and rare  risks such as cardiac or respiratory or neurological events, and allergic reactions. Discussed the role of CRNA in patient's perioperative care. Patient understands.)       Anesthesia Quick Evaluation

## 2024-02-05 NOTE — Plan of Care (Signed)
  Problem: Education: Goal: Ability to describe self-care measures that may prevent or decrease complications (Diabetes Survival Skills Education) will improve Outcome: Progressing Goal: Individualized Educational Video(s) Outcome: Progressing   Problem: Coping: Goal: Ability to adjust to condition or change in health will improve Outcome: Progressing   Problem: Fluid Volume: Goal: Ability to maintain a balanced intake and output will improve Outcome: Progressing   Problem: Health Behavior/Discharge Planning: Goal: Ability to identify and utilize available resources and services will improve Outcome: Progressing Goal: Ability to manage health-related needs will improve Outcome: Progressing   Problem: Metabolic: Goal: Ability to maintain appropriate glucose levels will improve Outcome: Progressing   Problem: Nutritional: Goal: Maintenance of adequate nutrition will improve Outcome: Progressing Goal: Progress toward achieving an optimal weight will improve Outcome: Progressing   Problem: Skin Integrity: Goal: Risk for impaired skin integrity will decrease Outcome: Progressing   Problem: Tissue Perfusion: Goal: Adequacy of tissue perfusion will improve Outcome: Progressing   Problem: Education: Goal: Knowledge of General Education information will improve Description: Including pain rating scale, medication(s)/side effects and non-pharmacologic comfort measures Outcome: Progressing   Problem: Health Behavior/Discharge Planning: Goal: Ability to manage health-related needs will improve Outcome: Progressing   Problem: Clinical Measurements: Goal: Ability to maintain clinical measurements within normal limits will improve Outcome: Progressing Goal: Will remain free from infection Outcome: Progressing Goal: Diagnostic test results will improve Outcome: Progressing Goal: Respiratory complications will improve Outcome: Progressing Goal: Cardiovascular complication will  be avoided Outcome: Progressing   Problem: Activity: Goal: Risk for activity intolerance will decrease Outcome: Progressing   Problem: Nutrition: Goal: Adequate nutrition will be maintained Outcome: Progressing   Problem: Coping: Goal: Level of anxiety will decrease Outcome: Progressing   Problem: Elimination: Goal: Will not experience complications related to bowel motility Outcome: Progressing Goal: Will not experience complications related to urinary retention Outcome: Progressing   Problem: Pain Managment: Goal: General experience of comfort will improve and/or be controlled Outcome: Progressing   Problem: Safety: Goal: Ability to remain free from injury will improve Outcome: Progressing   Problem: Skin Integrity: Goal: Risk for impaired skin integrity will decrease Outcome: Progressing   Problem: Fluid Volume: Goal: Hemodynamic stability will improve Outcome: Progressing   Problem: Clinical Measurements: Goal: Diagnostic test results will improve Outcome: Progressing Goal: Signs and symptoms of infection will decrease Outcome: Progressing   Problem: Respiratory: Goal: Ability to maintain adequate ventilation will improve Outcome: Progressing

## 2024-02-05 NOTE — Plan of Care (Signed)
  Problem: Skin Integrity: Goal: Risk for impaired skin integrity will decrease Outcome: Progressing   Problem: Education: Goal: Knowledge of General Education information will improve Description: Including pain rating scale, medication(s)/side effects and non-pharmacologic comfort measures Outcome: Not Progressing

## 2024-02-05 NOTE — Progress Notes (Addendum)
 [facility] CLINIC CARDIOLOGY PROGRESS NOTE       Patient ID: Tyler [name] MRN: [id number] DOB/AGE: 04/17/1946 78 y.o.  Admit date: 01/26/2024 Referring Physician Tyler [name] Primary Physician Tyler Mose, NP  Primary Cardiologist Tyler [name] Reason for Consultation chest pain  HPI: Tyler [name] is a 78 y.o. male  with a past medical history of chronic HFrEF, persistent atrial fibrillation, mitral insufficiency, CHB s/p PPM 2021, hypertension, chronic [name] disease stage III who presented to the ED on 01/26/2024 for chest pain. Cardiology was consulted for further evaluation.   Interval history: -Patient seen and examined this morning. Resting in hospital bed.  -Cr continues to improve.  -Remains on heparin  gtt. Will DC today. -ID following for management of bacteremia. TEE without endocarditis or PPM lead vegetation.   Review of systems complete and found to be negative unless listed above    Past Medical History:  Diagnosis Date   (HFpEF) heart failure with preserved ejection fraction (HCC) 03/01/2020   a.) TTE 03/01/2020: EF 55-60%, mod MAC, mod AoV sclerosis, triv AR, mild TR, mod MR, RVSP 50-59; b.) TTE 09/10/2020: EF 55-60%, mod MAC, mod AoV sclerosis, mild TR, 3+ MR, RVSP 50-59; c.) TTE 09/26/2020: EF 55-60%, mild LA dil, triv PR, mild MR/TR, RVSP 37-49   Adenoma of left adrenal gland    Anemia    Arthritis    Atrial fibrillation and flutter (HCC)    a.) CHA2DS2VASc = 5 (age x2, HFpEF, HTN, T2DM);  b.) s/p CTI ablation 09/07/2020; c.) rate/rhythm maintained on oral carvedilol ; not on chronic anticoagulation therapy   CAD (coronary artery disease)    Cardiomegaly    CKD (chronic [name] disease), stage III (HCC)    DOE (dyspnea on exertion)    Drug-induced bradycardia    Gangrene of toe of left foot (HCC)    a.) 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   H/O active rheumatic fever 08/22/2020   Hepatosplenomegaly    History of bilateral cataract extraction     HLD (hyperlipidemia)    Hypertension    Long term current use of aspirin     Lymphedema of both lower extremities    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)    a.) s/p amputation 11/04/2022   Peripheral vascular disease (HCC)    Pleural effusion on right 09/09/2020   a.) s/p RIGHT thoracentesis with 2180 cc yield   Pneumonia    Presence of permanent cardiac pacemaker 09/10/2020   a.) TVP placement 09/10/2020 due to intermittent CHB in setting of urosepsis; b.) s/p PPM placement 09/15/2020: MDT Azure XT DR (SN: [id number])   Pulmonary hypertension (HCC) 03/01/2020   a.) TTE: 03/01/2020: RVSP 50-59; b.) TTE 09/10/2020: RVSP 50-59; c.) TTE 09/26/2020: RVSP 37-49   RA (rheumatoid arthritis) (HCC)    Rheumatic fever    Sepsis (HCC) 09/10/2020   a.) urosepsis --> BC x 2 sets and UC all grew out significant Proteus mirabilis; admitted to [facility] 09/07/2020 - 09/27/2020.   Sick sinus syndrome [facility])    a.) s/p MDT PPM placement 09/15/2020   T2DM (type 2 diabetes mellitus) (HCC)    Urinary retention    chronic, with indwelling Foley catheter and plans for a suprapubic   Wears dentures    full upper    Past Surgical History:  Procedure Laterality Date   AMPUTATION TOE Right 11/04/2022   Procedure: AMPUTATION TOE;  Surgeon: Tyler [name], [name];  [name];  Service: Podiatry;  Laterality:  Right;   AMPUTATION TOE Right 09/15/2023   Procedure: AMPUTATION TOE;  Surgeon: Tyler [name], [name];  [name];  Service: Orthopedics/Podiatry;  Laterality: Right;  Right hallux amputation   BONE BIOPSY Right 04/05/2023   Procedure: BONE BIOPSY THIRD & FOURTH;  Surgeon: Tyler [name], [name];  [name];  Service: Podiatry;  Laterality: Right;   CARDIAC ELECTROPHYSIOLOGY STUDY AND ABLATION N/A 09/07/2020   Procedure: CARDIAC EP STUDY AND ABLATION (CTI)   CATARACT EXTRACTION W/PHACO Right 01/16/2017   Procedure: CATARACT EXTRACTION PHACO AND INTRAOCULAR LENS  PLACEMENT (IOC)  Right Complicated;  Surgeon: Tyler Kidney, MD;  Location: [facility];  Service: Ophthalmology;  Laterality: Right;  IVA Block Healon 5 malyugin vision blue Diabetic - oral meds   CATARACT EXTRACTION W/PHACO Left 10/23/2022   Procedure: CATARACT EXTRACTION PHACO AND INTRAOCULAR LENS PLACEMENT (IOC) LEFT DIABETIC;  Surgeon: Tyler Crews, MD;  Location: [facility];  Service: Ophthalmology;  Laterality: Left;  Diabetic   COLONOSCOPY     INCISION AND DRAINAGE OF WOUND Left 09/15/2023   Procedure: IRRIGATION AND DEBRIDEMENT WOUND;  Surgeon: Tyler [name], [name];  [name];  Service: Orthopedics/Podiatry;  Laterality: Left;  Left foot wound debridement, possible graft/biopsy   LOWER EXTREMITY ANGIOGRAPHY Right 11/02/2022   Procedure: Lower Extremity Angiography;  Surgeon: Tyler College, MD;  Location: [facility];  Service: Cardiovascular;  Laterality: Right;   LOWER EXTREMITY ANGIOGRAPHY Right 09/13/2023   Procedure: Lower Extremity Angiography;  Surgeon: Tyler Mass, MD;  Location: [facility];  Service: Cardiovascular;  Laterality: Right;   METATARSAL HEAD EXCISION Left 07/05/2018   Procedure: RESECTION FIRST METATARSAL INFECTED BONE AND SOFT TISSUE;  Surgeon: Tyler [name], [name];  [name];  Service: Podiatry;  Laterality: Left;   METATARSAL HEAD EXCISION Right 04/05/2023   Procedure: METATARSAL HEAD EXCISION THIRD & FOURTH;  Surgeon: Tyler [name], [name];  [name];  Service: Podiatry;  Laterality: Right;   PACEMAKER INSERTION  09/15/2020   TOE AMPUTATION Left    TONSILLECTOMY      Medications Prior to Admission  Medication Sig Dispense Refill Last Dose/Taking   acetaminophen  (TYLENOL ) 500 MG tablet Take 2 tablets (1,000 mg total) by mouth 3 (three) times daily as needed (for pain).   Taking As Needed   aspirin  EC 81 MG tablet Take 1 tablet (81 mg total) by mouth daily. Swallow whole. 30 tablet 0  01/25/2024   atorvastatin  (LIPITOR ) 10 MG tablet Take 1 tablet (10 mg total) by mouth daily at 6 PM. 30 tablet 11 Taking   bumetanide  (BUMEX ) 2 MG tablet Take 1 tablet (2 mg total) by mouth daily. 90 tablet 3 Past Week   clopidogrel  (PLAVIX ) 75 MG tablet Take 1 tablet (75 mg total) by mouth daily with breakfast. 30 tablet 11 01/25/2024 Morning   Finerenone  (KERENDIA ) 10 MG TABS Take 1 tablet (10 mg total) by mouth daily. 30 tablet 5 Past Week   furosemide  (LASIX ) 40 MG tablet Take 40 mg by mouth 2 (two) times daily.   01/25/2024   gentamicin  cream (GARAMYCIN ) 0.1 % Apply 1 Application topically 2 (two) times daily. 30 g 1 Taking   glipiZIDE  (GLUCOTROL ) 10 MG tablet Take 1 tablet (10 mg total) by mouth 2 (two) times daily. 60 tablet 2 Past Month   losartan  (COZAAR ) 25 MG tablet Take 25 mg by mouth daily.   Taking   metolazone  (ZAROXOLYN ) 2.5 MG tablet Take 1 tablet (2.5 mg total) by mouth  2 (two) times a week. On Mondays & Fridays   Past Month   Multiple Vitamins-Minerals (MULTIVITAMIN WITH MINERALS) tablet Take 1 tablet by mouth daily.   01/25/2024   silver  sulfADIAZINE  (SILVADENE ) 1 % cream Apply to affected area daily 50 g 1 Taking   doxycycline  (VIBRA -TABS) 100 MG tablet Take 1 tablet (100 mg total) by mouth 2 (two) times daily. (Patient not taking: Reported on 01/26/2024) 20 tablet 0 Not Taking   metFORMIN  (GLUCOPHAGE ) 1000 MG tablet Take 1 tablet (1,000 mg total) by mouth 2 (two) times daily. (Patient not taking: Reported on 01/26/2024) 60 tablet 2 Not Taking   Social History   Socioeconomic History   Marital status: Single    Spouse name: Not on file   Number of children: Not on file   Years of education: Not on file   Highest education level: Not on file  Occupational History    Comment: [employer]  Tobacco Use   Smoking status: Never   Smokeless tobacco: Never  Vaping Use   Vaping status: Never Used  Substance and Sexual Activity   Alcohol use: Not Currently    Comment: rarely    Drug use: Never   Sexual activity: Not Currently    Birth control/protection: None  Other Topics Concern   Not on file  Social History Narrative   Lives with roommate   Social Drivers of Health   Financial Resource Strain: Low Risk  (03/20/2023)   Received from [facility]   Overall Financial Resource Strain (CARDIA)    Difficulty of Paying Living Expenses: Not hard at all  Food Insecurity: No Food Insecurity (01/27/2024)   Hunger Vital Sign    Worried About Running Out of Food in the Last Year: Never true    Ran Out of Food in the Last Year: Never true  Transportation Needs: No Transportation Needs (01/27/2024)   PRAPARE - Administrator, Civil Service (Medical): No    Lack of Transportation (Non-Medical): No  Physical Activity: Not on file  Stress: No Stress Concern Present (09/07/2020)   Received from [facility] - Occupational Stress Questionnaire    Feeling of Stress : Not at all  Social Connections: Socially Isolated (01/27/2024)   Social Connection and Isolation Panel [NHANES]    Frequency of Communication with Friends and Family: More than three times a week    Frequency of Social Gatherings with Friends and Family: Twice a week    Attends Religious Services: Never    Database administrator or Organizations: No    Attends Banker Meetings: Never    Marital Status: Divorced  Catering manager Violence: Not At Risk (01/27/2024)   Humiliation, Afraid, Rape, and Kick questionnaire    Fear of Current or Ex-Partner: No    Emotionally Abused: No    Physically Abused: No    Sexually Abused: No    Family History  Problem Relation Age of Onset   Cancer Niece      Vitals:   02/05/24 0000 02/05/24 0400 02/05/24 0500 02/05/24 0818  BP: 128/61 117/81  120/63  Pulse: 82 98  98  Resp: 19 (!) 21  18  Temp: 98.5 F (36.9 C) 98.3 F (36.8 C)  98.1 F (36.7 C)  TempSrc: Oral Oral     SpO2: 93%   90%  Weight:   107.6 kg   Height:  PHYSICAL EXAM General: chronically ill appearing elderly male, well nourished, in no acute distress. HEENT: Normocephalic and atraumatic. Neck: No JVD.  Lungs: Normal respiratory effort on room air.  Heart: HRRR. Normal S1 and S2 without gallops or murmurs.  Abdomen: Non-distended appearing.  Msk: Normal strength and tone for age. Extremities: Warm and well perfused. No clubbing, cyanosis. No edema bilaterally.  Neuro: Oriented X 2. Psych: Altered  Labs: Basic Metabolic Panel: Recent Labs    02/03/24 0351 02/04/24 0618 02/05/24 0233  NA 139 141 140  K 3.8 3.8 3.8  CL 106 108 111  CO2 21* 21* 21*  GLUCOSE 189* 197* 205*  BUN 67* 63* 62*  CREATININE 1.46* 1.34* 1.21  CALCIUM  8.9 9.3 8.8*  MG 2.5*  --   --    Liver Function Tests: No results for input(s): "AST", "ALT", "ALKPHOS", "BILITOT", "PROT", "ALBUMIN" in the last 72 hours.  No results for input(s): "LIPASE", "AMYLASE" in the last 72 hours. CBC: Recent Labs    02/04/24 0618 02/05/24 0233  WBC 11.1* 10.8*  HGB 11.5* 11.1*  HCT 37.7* 35.4*  MCV 86.7 85.5  PLT 265 273   Cardiac Enzymes: No results for input(s): "CKTOTAL", "CKMB", "CKMBINDEX", "TROPONINIHS" in the last 72 hours.  BNP: No results for input(s): "BNP" in the last 72 hours.  D-Dimer: No results for input(s): "DDIMER" in the last 72 hours. Hemoglobin A1C: No results for input(s): "HGBA1C" in the last 72 hours.  Fasting Lipid Panel: No results for input(s): "CHOL", "HDL", "LDLCALC", "TRIG", "CHOLHDL", "LDLDIRECT" in the last 72 hours. Thyroid Function Tests: No results for input(s): "TSH", "T4TOTAL", "T3FREE", "THYROIDAB" in the last 72 hours.  Invalid input(s): "FREET3" Anemia Panel: No results for input(s): "VITAMINB12", "FOLATE", "FERRITIN", "TIBC", "IRON", "RETICCTPCT" in the last 72 hours.   Radiology: DG Knee 1-2 Views Left Result Date: 01/30/2024 CLINICAL DATA:  Knee effusion.  EXAM: LEFT KNEE - 2 VIEW COMPARISON:  None Available. FINDINGS: Osteopenia. Joint space loss mildly of the mediolateral compartment and patellofemoral joint with scattered osteophyte formation. Scattered vascular calcifications are identified. There is some chondrocalcinosis as well of the mediolateral compartments. Subchondral cyst formation along the medial tibial plateau. The lateral view are oblique limiting evaluation. No large effusion but evaluation of a a small effusion is limited. Repeat lateral view could be considered versus additional imaging. IMPRESSION: Degenerative changes. Osteopenia. Chondrocalcinosis. No large effusion but lateral views are limited due to rotation. Subtle effusion is not excluded. Further workup as clinically appropriate Electronically Signed   By: Adrianna Horde M.D.   On: 01/30/2024 16:58   US  EKG SITE RITE Result Date: 01/29/2024 If Site Rite image not attached, placement could not be confirmed due to current cardiac rhythm.  ECHOCARDIOGRAM COMPLETE Result Date: 01/29/2024    ECHOCARDIOGRAM REPORT   Patient Name:   Tyler [name] Date of Exam: 01/28/2024 Medical Rec #:  [id number]      Height:       78.0 in Accession #:    [id number]     Weight:       260.8 lb Date of Birth:  Oct 23, 1945      BSA:          2.527 m Patient Age:    77 years       BP:           103/75 mmHg Patient Gender: M              HR:  96 bpm. Exam Location:  [facility] Procedure: 2D Echo, Cardiac Doppler and Color Doppler (Both Spectral and Color            Flow Doppler were utilized during procedure). Indications:     NSTEMI I21.4  History:         Patient has no prior history of Echocardiogram examinations,                  most recent 07/04/2023. CHF and Cardiomyopathy, Previous                  Myocardial Infarction, Pacemaker, PAD and CKD, stage 3,                  Arrythmias:Bradycardia, Atrial Fibrillation and Atrial Flutter,                  Signs/Symptoms:Dyspnea and Syncope; Risk Factors:Diabetes,                   Hypertension and Dyslipidemia.  Sonographer:     Terrilee Few RCS Referring Phys:  [id number] Angla Delahunt Diagnosing Phys: Lida Reeks Alluri IMPRESSIONS  1. Left ventricular ejection fraction, by estimation, is 25 to 30%. The left ventricle has severely decreased function. The left ventricle demonstrates global hypokinesis. There is mild left ventricular hypertrophy. Left ventricular diastolic parameters  are indeterminate.  2. Right ventricular systolic function is moderately reduced. The right ventricular size is mildly enlarged. There is severely elevated pulmonary artery systolic pressure. The estimated right ventricular systolic pressure is 73.9 mmHg.  3. Left atrial size was mildly dilated.  4. Right atrial size was moderately dilated.  5. Eccentric mitral regurgitation which can be underestimated on this study. Recommend TEE for further evaluation. Moderate to severe mitral valve regurgitation. Mild to moderate mitral stenosis. Severe mitral annular calcification.  6. Sclerotic aortic valve with low flow low gradient aortic stenosis, probably mild to moderate.. The aortic valve is tricuspid.  7. Aortic dilatation noted. There is mild dilatation of the aortic root, measuring 44 mm. FINDINGS  Left Ventricle: Left ventricular ejection fraction, by estimation, is 25 to 30%. The left ventricle has severely decreased function. The left ventricle demonstrates global hypokinesis. There is mild left ventricular hypertrophy. Left ventricular diastolic parameters are indeterminate. Right Ventricle: The right ventricular size is mildly enlarged. No increase in right ventricular wall thickness. Right ventricular systolic function is moderately reduced. There is severely elevated pulmonary artery systolic pressure. The tricuspid regurgitant velocity is 4.06 m/s, and with an assumed right atrial pressure of 8 mmHg, the estimated right ventricular systolic pressure is 73.9 mmHg. Left Atrium: Left atrial  size was mildly dilated. Right Atrium: Right atrial size was moderately dilated. Pericardium: There is no evidence of pericardial effusion. Mitral Valve: Eccentric mitral regurgitation which can be underestimated on this study. Recommend TEE for further evaluation. There is mild thickening of the mitral valve leaflet(s). There is mild calcification of the mitral valve leaflet(s). Severe mitral annular calcification. Moderate to severe mitral valve regurgitation, with anteriorly-directed jet. Mild to moderate mitral valve stenosis. MV peak gradient, 14.7 mmHg. The mean mitral valve gradient is 8.4 mmHg. Tricuspid Valve: The tricuspid valve is normal in structure. Tricuspid valve regurgitation is mild. Aortic Valve: Sclerotic aortic valve with low flow low gradient aortic stenosis, probably mild to moderate. The aortic valve is tricuspid. Aortic valve mean gradient measures 10.0 mmHg. Aortic valve peak gradient measures 16.4 mmHg. Aortic valve area, by  VTI measures 1.43 cm. Pulmonic Valve: The  pulmonic valve was not well visualized. Pulmonic valve regurgitation is trivial. Aorta: Aortic dilatation noted. There is mild dilatation of the aortic root, measuring 44 mm. IAS/Shunts: The interatrial septum was not well visualized.  LEFT VENTRICLE PLAX 2D LVIDd:         5.50 cm   Diastology LVIDs:         4.50 cm   LV e' medial:    5.87 cm/s LV PW:         1.60 cm   LV E/e' medial:  22.5 LV IVS:        1.50 cm   LV e' lateral:   10.80 cm/s LVOT diam:     2.30 cm   LV E/e' lateral: 12.2 LV SV:         53 LV SV Index:   21 LVOT Area:     4.15 cm  RIGHT VENTRICLE            IVC RV S prime:     8.59 cm/s  IVC diam: 3.00 cm TAPSE (M-mode): 1.6 cm LEFT ATRIUM              Index        RIGHT ATRIUM           Index LA diam:        5.90 cm  2.33 cm/m   RA Area:     28.30 cm LA Vol (A2C):   114.0 ml 45.11 ml/m  RA Volume:   112.00 ml 44.32 ml/m LA Vol (A4C):   79.7 ml  31.54 ml/m LA Biplane Vol: 95.1 ml  37.63 ml/m  AORTIC  VALVE AV Area (Vmax):    1.77 cm AV Area (Vmean):   1.56 cm AV Area (VTI):     1.43 cm AV Vmax:           202.50 cm/s AV Vmean:          144.500 cm/s AV VTI:            0.375 m AV Peak Grad:      16.4 mmHg AV Mean Grad:      10.0 mmHg LVOT Vmax:         86.13 cm/s LVOT Vmean:        54.333 cm/s LVOT VTI:          0.129 m LVOT/AV VTI ratio: 0.34  AORTA Ao Asc diam: 3.80 cm MITRAL VALVE                TRICUSPID VALVE MV Area (PHT): 2.66 cm     TR Peak grad:   65.9 mmHg MV Area VTI:   1.36 cm     TR Vmax:        406.00 cm/s MV Peak grad:  14.7 mmHg MV Mean grad:  8.4 mmHg     SHUNTS MV Vmax:       1.91 m/s     Systemic VTI:  0.13 m MV Vmean:      134.8 cm/s   Systemic Diam: 2.30 cm MV Decel Time: 285 msec MV E velocity: 132.00 cm/s MV A velocity: 98.50 cm/s MV E/A ratio:  1.34 Joetta Mustache Electronically signed by Joetta Mustache Signature Date/Time: 01/29/2024/1:23:56 PM    Final    CT Angio Chest Pulmonary Embolism (PE) W or WO Contrast Result Date: 01/27/2024 CLINICAL DATA:  Chest pain.  PE suspected EXAM: CT ANGIOGRAPHY CHEST WITH CONTRAST TECHNIQUE: Multidetector CT imaging of the chest was performed  using the standard protocol during bolus administration of intravenous contrast. Multiplanar CT image reconstructions and MIPs were obtained to evaluate the vascular anatomy. RADIATION DOSE REDUCTION: This exam was performed according to the departmental dose-optimization program which includes automated exposure control, adjustment of the mA and/or kV according to patient size and/or use of iterative reconstruction technique. CONTRAST:  75mL OMNIPAQUE  IOHEXOL  350 MG/ML SOLN COMPARISON:  Same day chest radiograph FINDINGS: Cardiovascular: Cardiomegaly. Mitral annular calcification. Coronary artery and aortic atherosclerotic calcification. Mild dilation of the ascending aorta measuring 40 mm in diameter. No pericardial effusion. Negative for acute pulmonary embolism. Left chest wall pacemaker. Mediastinum/Nodes:  Trachea and esophagus are unremarkable. Shotty mediastinal lymph nodes are likely reactive. Lungs/Pleura: Small right pleural effusion. Interlobular septal thickening and hazy ground-glass opacities with a lower lung and posterior predominance. No focal consolidation, pleural effusion, or pneumothorax. Upper Abdomen: No acute abnormality. Musculoskeletal: No acute fracture. Review of the MIP images confirms the above findings. IMPRESSION: 1. Negative for acute pulmonary embolism. 2. Findings suggestive of CHF with mild pulmonary edema. Small right pleural effusion. 3. 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 Recommend annual imaging followup by CTA or MRA. This recommendation follows 2010 ACCF/AHA/AATS/ACR/ASA/SCA/SCAI/SIR/STS/SVM Guidelines for the Diagnosis and Management of Patients with Thoracic Aortic Disease. Circulation. 2010; 121: Z610-R604. Aortic aneurysm NOS (ICD10-I71.9) 4. Aortic Atherosclerosis (ICD10-I70.0). Electronically Signed   By: Rozell Cornet M.D.   On: 01/27/2024 22:11   CT Head Wo Contrast Result Date: 01/26/2024 CLINICAL DATA:  Mental status change. EXAM: CT HEAD WITHOUT CONTRAST TECHNIQUE: Contiguous axial images were obtained from the base of the skull through the vertex without intravenous contrast. RADIATION DOSE REDUCTION: This exam was performed according to the departmental dose-optimization program which includes automated exposure control, adjustment of the mA and/or kV according to patient size and/or use of iterative reconstruction technique. COMPARISON:  Head CT 07/04/2018. FINDINGS: Brain: No evidence of acute infarction, hemorrhage, hydrocephalus, extra-axial collection or [name] lesion/[name] effect. There is mild diffuse atrophy and mild periventricular white matter hypodensity which is similar to the prior study. Vascular: Atherosclerotic calcifications are present within the cavernous internal carotid arteries. Skull: Normal. Negative for fracture or focal lesion.  Sinuses/Orbits: No acute finding. Other: None. IMPRESSION: 1. No acute intracranial process. 2. Mild diffuse atrophy and mild chronic small vessel ischemic changes. Electronically Signed   By: Tyron Gallon M.D.   On: 01/26/2024 20:18   DG Chest Portable 1 View Result Date: 01/26/2024 CLINICAL DATA:  Chest pain EXAM: PORTABLE CHEST 1 VIEW COMPARISON:  09/11/2023 FINDINGS: Left-sided pacing device similar in position. Cardiomegaly with mild central congestion. No consolidation, pleural effusion or pneumothorax. IMPRESSION: Cardiomegaly with mild central congestion. Electronically Signed   By: Esmeralda Hedge M.D.   On: 01/26/2024 20:14    ECHO as above  TELEMETRY reviewed by me 02/05/2024: ventricular pacing rate 90s  EKG reviewed by me: atrial fibrillation, rate 112 bpm PVCs.    Data reviewed by me 02/05/2024: last 24h vitals tele labs imaging I/O's hospitalist progress note, rapid response notes, ID notes, advanced heart failure notes, EP notes, ortho notes  Principal Problem:   NSTEMI (non-ST elevated myocardial infarction) (HCC) Active Problems:   Sepsis due to cellulitis (HCC)   Acute on chronic systolic CHF (congestive heart failure) (HCC)   Type II diabetes mellitus with renal manifestations (HCC)   Dyslipidemia   Essential hypertension   Acute [name] injury superimposed on stage 3a chronic [name] disease (HCC) - baseline scr 1.3   Pressure injury of skin  Septicemia due to group B Streptococcus (HCC)   Chronic respiratory failure with hypoxia (HCC)   Effusion of left knee   Pseudogout of left knee   Bacteremia due to group B Streptococcus    ASSESSMENT AND PLAN:  Tyler [name] is a 78 y.o. male  with a past medical history of chronic HFrEF, persistent atrial fibrillation, mitral insufficiency, CHB s/p PPM 2021, hypertension, chronic [name] disease stage III who presented to the ED on 01/26/2024 for chest pain. Cardiology was consulted for further evaluation.   # Severe sepsis #  Strep agalactiae bacteremia Rapid response called overnight 5/6 for acute decompensated respiratory status, tachycardia, fever.  Meets sepsis criteria. Blood cultures growing strep agalactiae with high bioburden.  No clear evidence of any vegetation on TTE. -ID following, appreciate recommendations.  -Plan for TEE today at 12 PM.   # NSTEMI, type I vs type II # Acute on chronic HFrEF Patient reports to ED due to worsening SOB. Patient was seen outpatient cardiology on 09/2023 and there was plan for ischemic eval with PE stress test, however patient did not perform this exam or follow-up since this visit. Patient appears fluid overload on exam. Troponins elevated and trending 69 > 105 > 326 > 714 > 1300 > 1800. EKG in ED with AF PVCs with no acute ischemic changes, rate 112 bpm. Cr is elevated at 1.56 (baseline around 1.35). BP is stable. BNP elevated at 4043.  Rapid response called overnight 5/6 for acute decompensated respiratory status, tachycardia, fever.  Meets sepsis criteria.  Echo this admission with EF 25-30%, global hypokinesis, moderate RV dysfunction, severely elevated PASP, eccentric moderate to severe MR, low-flow low gradient mild to moderate aortic stenosis. -DC heparin  today.  -Advanced heart failure signed off 5/13.  Appreciate recommendations. -Continue po Bumex  2 mg daily. Net negative 13.4L since admission.  -Start losartan  12.5 mg daily. Continue metoprolol  succinate 25 mg daily. Defer addition of SGLT2i as he currently has a foley.  -Continue ASA 81 mg, atorvastatin  80 mg daily. -Patient will eventually need ischemic evaluation, not candidate at this time. Consider outpatient.   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 Underwent PPM implant in 2021 for CHB, has had regular device checks since implant. EKG in ED and per tele  this AM AF rates in 100s.  -Beta-blocker as above.  -Recommend anticoagulation however patient has historically deferred given prior bleeding issues. -EP following. TEE without lead vegetation so no plan for extraction.   # Chronic [name] disease stage IIIa Patient with hx of CKD, baseline Cr 1.3-1.4. Cr this AM 1.21. BUN 62. -Continue to monitor renal function closely during diuresis. -Management per primary.   Patient with multiple co-morbidities, declining mental and functional status. Prognosis is guarded.    This patient's plan of care was discussed and created with Dr. Custovic and she is in agreement.  Signed: Hamp Levine, PA-C  02/05/2024, 8:35 AM [facility]

## 2024-02-05 NOTE — Progress Notes (Signed)
 Palliative Care Progress Note, Assessment & Plan   Patient Name: Tyler [name]       Date: 02/05/2024 DOB: [birth year]  Age: 78 y.o. MRN#: [id number] Attending Physician: [name], Alexandra, DO Primary Care Physician: [name], Kyra, NP Admit Date: 01/26/2024  Subjective: Patient is lying in bed, sleeping, in no apparent distress.  Nurse tech is at bedside obtaining vital signs.  No family or friends present at bedside during my visit.  HPI: 78 y.o. male  with past medical history of HFpEF, A-fib/flutter, AV block s/p PPM (2021), CAD, CKD stage III, hypertension and hyperlipidemia admitted from home on 01/26/2024 with dizziness, lightheadedness and confusion.  Also reported dull chest pain.   EKG on arrival showed A-fib with RVR, PVCs and L BBB Elevated troponin and significantly elevated BNP Chest x-ray with cardiomegaly and congestion   During this admission found to have positive blood cultures growing group B streptococcus, ID consulted Concern for endocarditis and or pacing wire vegetation as well as concern for left knee septic arthritis S/p left knee aspiration   Advanced heart failure team also following   Palliative medicine was consulted for assisting with goals of care conversations  Summary of counseling/coordination of care: Extensive chart review completed prior to meeting patient including labs, vital signs, imaging, progress notes, orders, and available advanced directive documents from current and previous encounters.   After reviewing the patient's chart and assessing the patient at bedside, it remains that patient is unable to participate in goals of care or medical decision making independently at this time.  After visiting with patient, I spoke with his daughter Tyler [name] over the  phone.  Brief medical update given, infectious disease recommendations reviewed.  Space and opportunity provided for Tyler [name] to ask questions and share her thoughts on patient's current medical situation.  She shares concern that facility sit are closer to her have not been investigated however she plans to follow-up with Tyler [name] with [facility] tomorrow to clarify.  Additionally, discussed that TEE revealed no vegetation on pacemaker and therefore replacement is not needed.  She shares relief that this issue has been resolved.  Discussed that patient's functional status still remains poor.  Additionally, his cognitive status is not back to baseline.  Ongoing support to continue.  Tyler [name] was appreciative of my phone call.  Questions and concerns were addressed.  PMT will continue to follow and support patient and family throughout his hospitalization.  Physical Exam Vitals reviewed.  Constitutional:      General: He is not in acute distress.    Appearance: He is normal weight. He is not toxic-appearing.  HENT:     Head: Normocephalic.  Cardiovascular:     Rate and Rhythm: Normal rate. Rhythm irregular.  Pulmonary:     Effort: Pulmonary effort is normal.  Musculoskeletal:     Comments: Generalized weakness  Skin:    General: Skin is warm and dry.  Psychiatric:        Mood and Affect: Mood normal. Mood is not anxious.        Behavior: Behavior normal. Behavior is not agitated.             Total Time 25 minutes   Time spent  includes: Detailed review of medical records (labs, imaging, vital signs), medically appropriate exam (mental status, respiratory, cardiac, skin), discussed with treatment team, counseling and educating patient, family and staff, documenting clinical information, medication management and coordination of care.  Tyler Nose L. Rebbeca Campi, DNP, FNP-BC Palliative Medicine Team

## 2024-02-05 NOTE — TOC Progression Note (Signed)
 Transition of Care [facility]) - Progression Note    Patient Details  Name: Tyler [name] MRN: [id number] Date of Birth: September 08, 1946  Transition of Care [facility]) CM/SW Contact  Baird Bombard, RN Phone Number: 02/05/2024, 11:45 AM  Clinical Narrative:    Spoke with patient's daughter, [name] regarding current bed offers for [facility], [facility], [facility] and [facility] in [place]. She stated she was waiting to hear back fromtThis case manger since last week. She was advised this RNCM has not been previously assigned to this patient prior today. [name] stated she is waiting to hear back from facilities including: [facility], [facility], and [facility]. She was advised [facility] has not offered and there has been no response from [facility] or [facility]. [name] stated he's not going to be ready to discharge for weeks. RNCM reiterated discharge planning is proactive and TOC will follow up for her choice.       Expected Discharge Plan: Home/Self Care Barriers to Discharge: Continued Medical Work up  Expected Discharge Plan and Services     Post Acute Care Choice: NA Living arrangements for the past 2 months: Single Family Home                                       Social Determinants of Health (SDOH) Interventions SDOH Screenings   Food Insecurity: No Food Insecurity (01/27/2024)  Housing: Low Risk  (01/27/2024)  Transportation Needs: No Transportation Needs (01/27/2024)  Utilities: Not At Risk (01/27/2024)  Alcohol Screen: Low Risk  (08/14/2023)  Financial Resource Strain: Low Risk  (03/20/2023)   Received from [facility], [facility]  Social Connections: Socially Isolated (01/27/2024)  Stress: No Stress Concern Present (09/07/2020)   Received from [facility], [facility]  Tobacco Use: Low Risk  (01/26/2024)    Readmission Risk Interventions    01/29/2024   12:58 PM 09/12/2023   12:13 PM 08/15/2023    3:57 PM  Readmission Risk  Prevention Plan  Transportation Screening Complete Complete Complete  PCP or Specialist Appt within 3-5 Days  Complete Complete  HRI or Home Care Consult  Complete Complete  Social Work Consult for Recovery Care Planning/Counseling  Complete Complete  Palliative Care Screening  Not Applicable Not Applicable  Medication Review [employer]) Complete Complete Complete  PCP or Specialist appointment within 3-5 days of discharge Complete    SW Recovery Care/Counseling Consult Complete    Palliative Care Screening Not Applicable    Skilled Nursing Facility Not Applicable

## 2024-02-05 NOTE — Progress Notes (Signed)
 OT Cancellation Note  Patient Details Name: Tyler [name] MRN: [id number] DOB: 1945-11-09   Cancelled Treatment:    Reason Eval/Treat Not Completed: Medical issues which prohibited therapy. OT attempt - pt received supine, family in room, on RA with RR fluctuating between 19-44 at rest. RN aware. Will hold OT treatment at this time, and re-attempt when medically appropriate.   Jamelah Sitzer L. Rainen Vanrossum, OTR/L  02/05/24, 2:56 PM

## 2024-02-05 NOTE — Plan of Care (Signed)
  Problem: Clinical Measurements: Goal: Ability to maintain clinical measurements within normal limits will improve Outcome: Progressing   Problem: Pain Managment: Goal: General experience of comfort will improve and/or be controlled Outcome: Progressing   Problem: Safety: Goal: Ability to remain free from injury will improve Outcome: Progressing   Problem: Elimination: Goal: Will not experience complications related to urinary retention Outcome: Progressing

## 2024-02-05 NOTE — Progress Notes (Signed)
 Date of Admission:  01/26/2024      ID: Tyler [name] is a 78 y.o. 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)   Type II diabetes mellitus with renal manifestations (HCC)   Dyslipidemia   Essential hypertension   Acute kidney injury superimposed on stage 3a chronic kidney disease (HCC) - baseline scr 1.3   Pressure injury of skin   Septicemia due to group B Streptococcus (HCC)   Chronic respiratory failure with hypoxia (HCC)   Effusion of left knee   Pseudogout of left knee   Bacteremia due to group B Streptococcus    Subjective: Patient is sleeping all the time On waking him up he answers to some questions and goes back to sleep  Medications:   vitamin C   500 mg Oral BID   aspirin   81 mg Oral Daily   atorvastatin   80 mg Oral q1800   bumetanide   2 mg Oral Daily   Chlorhexidine  Gluconate Cloth  6 each Topical Daily   feeding supplement (NEPRO CARB STEADY)  237 mL Oral BID BM   gentamicin  cream  1 Application Topical BID   insulin  aspart  0-15 Units Subcutaneous TID WC   insulin  aspart  0-5 Units Subcutaneous QHS   insulin  glargine-yfgn  5 Units Subcutaneous QHS   losartan   12.5 mg Oral Daily   metoprolol  succinate  25 mg Oral Daily   multivitamin with minerals  1 tablet Oral Daily   silver  sulfADIAZINE    Topical Daily    Objective: Vital signs in last 24 hours: Patient Vitals for the past 24 hrs:  BP Temp Temp src Pulse Resp SpO2 Weight  02/05/24 2120 -- 98 F (36.7 C) -- -- -- -- --  02/05/24 1533 110/67 98.3 F (36.8 C) -- 93 17 96 % --  02/05/24 1052 (!) 102/57 98.4 F (36.9 C) -- 100 17 100 % --  02/05/24 0900 -- 98.4 F (36.9 C) -- -- -- -- --  02/05/24 0818 120/63 98.1 F (36.7 C) -- 98 18 90 % --  02/05/24 0500 -- -- -- -- -- -- 107.6 kg  02/05/24 0400 117/81 98.3 F (36.8 C) Oral 98 (!) 21 -- --   02/05/24 0000 128/61 98.5 F (36.9 C) Oral 82 19 93 % --       PHYSICAL EXAM:  General: somnolent Answers questions  lungs: Bilateral air entry.  crepts Heart: Irregular Abdomen: Soft, non-tender,not distended. Bowel sounds normal. No masses Extremities: Left knee swelling improved Edema legs better Foot dressing removed and wounds were examined.  Much improved                Skin: Venous stasis pigmentation extremities   lymph: Cervical, supraclavicular normal. Neurologic: Grossly non-focal  Lab Results    Latest Ref Rng & Units 02/05/2024    2:33 AM 02/04/2024    6:18 AM 02/03/2024    3:51 AM  CBC  WBC 4.0 - 10.5 K/uL 10.8  11.1  11.2   Hemoglobin 13.0 - 17.0 g/dL 78.2  95.6  21.3   Hematocrit 39.0 - 52.0 % 35.4  37.7  35.9   Platelets 150 - 400 K/uL 273  265  234        Latest Ref Rng & Units 02/05/2024    2:33 AM 02/04/2024    6:18 AM 02/03/2024  3:51 AM  CMP  Glucose 70 - 99 mg/dL 161  096  045   BUN 8 - 23 mg/dL 62  63  67   Creatinine 0.61 - 1.24 mg/dL 4.09  8.11  9.14   Sodium 135 - 145 mmol/L 140  141  139   Potassium 3.5 - 5.1 mmol/L 3.8  3.8  3.8   Chloride 98 - 111 mmol/L 111  108  106   CO2 22 - 32 mmol/L 21  21  21    Calcium  8.9 - 10.3 mg/dL 8.8  9.3  8.9       Microbiology: 01/27/2024 blood culture group B streptococcus BC: 01/29/2024 no growth     Assessment/Plan:  Group B streptococcus bacteremia with high bioburden.  4 out of 4 blood cultures.  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 The source of the group B streptococcus was the foot wounds They are looking better now They may not need further debridement If in 6 weeks if the wounds have not healed then we will do suppressive amoxicillin  therapy following IV ceftriaxone .  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  No need to treat the MRSA or Enterococcus his wound is doing much better with ceftriaxone  and offloading his feet.  Not putting pressure.     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  Status post aspiration.  Culture NG   Discussed the management with the patient and hospitalist

## 2024-02-05 NOTE — Progress Notes (Signed)
 PT Cancellation Note  Patient Details Name: Tyler [name] MRN: [id number] DOB: October 28, 1945   Cancelled Treatment:     PT/OT treatment attempt however pt has elevated RR and concerned daughter at bedside. RN is aware. Pt was unable to stay awake long enough to safely participate. Will return tomorrow and continue to follow per current POC.    Koleen Perna 02/05/2024, 2:56 PM

## 2024-02-06 ENCOUNTER — Inpatient Hospital Stay

## 2024-02-06 DIAGNOSIS — E11621 Type 2 diabetes mellitus with foot ulcer: Secondary | ICD-10-CM | POA: Diagnosis not present

## 2024-02-06 DIAGNOSIS — L97512 Non-pressure chronic ulcer of other part of right foot with fat layer exposed: Secondary | ICD-10-CM

## 2024-02-06 DIAGNOSIS — A401 Sepsis due to streptococcus, group B: Secondary | ICD-10-CM | POA: Diagnosis not present

## 2024-02-06 DIAGNOSIS — Z515 Encounter for palliative care: Secondary | ICD-10-CM

## 2024-02-06 DIAGNOSIS — L97522 Non-pressure chronic ulcer of other part of left foot with fat layer exposed: Secondary | ICD-10-CM | POA: Diagnosis not present

## 2024-02-06 DIAGNOSIS — E440 Moderate protein-calorie malnutrition: Secondary | ICD-10-CM | POA: Insufficient documentation

## 2024-02-06 DIAGNOSIS — G9340 Encephalopathy, unspecified: Secondary | ICD-10-CM | POA: Diagnosis not present

## 2024-02-06 DIAGNOSIS — B951 Streptococcus, group B, as the cause of diseases classified elsewhere: Secondary | ICD-10-CM | POA: Diagnosis not present

## 2024-02-06 DIAGNOSIS — R7881 Bacteremia: Secondary | ICD-10-CM | POA: Diagnosis not present

## 2024-02-06 DIAGNOSIS — J9611 Chronic respiratory failure with hypoxia: Secondary | ICD-10-CM | POA: Diagnosis not present

## 2024-02-06 DIAGNOSIS — I214 Non-ST elevation (NSTEMI) myocardial infarction: Secondary | ICD-10-CM | POA: Diagnosis not present

## 2024-02-06 DIAGNOSIS — L97519 Non-pressure chronic ulcer of other part of right foot with unspecified severity: Secondary | ICD-10-CM

## 2024-02-06 DIAGNOSIS — L97529 Non-pressure chronic ulcer of other part of left foot with unspecified severity: Secondary | ICD-10-CM

## 2024-02-06 DIAGNOSIS — Z794 Long term (current) use of insulin: Secondary | ICD-10-CM

## 2024-02-06 LAB — GLUCOSE, CAPILLARY
Glucose-Capillary: 208 mg/dL — ABNORMAL HIGH (ref 70–99)
Glucose-Capillary: 268 mg/dL — ABNORMAL HIGH (ref 70–99)
Glucose-Capillary: 205 mg/dL — ABNORMAL HIGH (ref 70–99)
Glucose-Capillary: 207 mg/dL — ABNORMAL HIGH (ref 70–99)

## 2024-02-06 LAB — BASIC METABOLIC PANEL WITH GFR
Anion gap: 11 (ref 5–15)
BUN: 63 mg/dL — ABNORMAL HIGH (ref 8–23)
CO2: 22 mmol/L (ref 22–32)
Calcium: 9.1 mg/dL (ref 8.9–10.3)
Chloride: 111 mmol/L (ref 98–111)
Creatinine, Ser: 1.34 mg/dL — ABNORMAL HIGH (ref 0.61–1.24)
GFR, Estimated: 54 mL/min — ABNORMAL LOW (ref >60)
Glucose, Bld: 194 mg/dL — ABNORMAL HIGH (ref 70–99)
Potassium: 3.8 mmol/L (ref 3.5–5.1)
Sodium: 144 mmol/L (ref 135–145)

## 2024-02-06 LAB — RAPID HIV SCREEN (HIV 1/2 AB+AG)
HIV 1/2 Antibodies: NONREACTIVE
HIV-1 P24 Antigen - HIV24: NONREACTIVE

## 2024-02-06 LAB — CBC
HCT: 38.5 % — ABNORMAL LOW (ref 39.0–52.0)
Hemoglobin: 11.8 g/dL — ABNORMAL LOW (ref 13.0–17.0)
MCH: 26.5 pg (ref 26.0–34.0)
MCHC: 30.6 g/dL (ref 30.0–36.0)
MCV: 86.5 fL (ref 80.0–100.0)
Platelets: 297 10*3/uL (ref 150–400)
RBC: 4.45 MIL/uL (ref 4.22–5.81)
RDW: 16.3 % — ABNORMAL HIGH (ref 11.5–15.5)
WBC: 13.5 10*3/uL — ABNORMAL HIGH (ref 4.0–10.5)
nRBC: 0 % (ref 0.0–0.2)

## 2024-02-06 LAB — BLOOD GAS, ARTERIAL
Acid-base deficit: 1.1 mmol/L (ref 0.0–2.0)
Bicarbonate: 22.4 mmol/L (ref 20.0–28.0)
FIO2: 0.21 %
O2 Saturation: 95.3 %
Patient temperature: 37
pCO2 arterial: 33 mmHg (ref 32–48)
pH, Arterial: 7.44 (ref 7.35–7.45)
pO2, Arterial: 73 mmHg — ABNORMAL LOW (ref 83–108)

## 2024-02-06 LAB — AMMONIA: Ammonia: 16 umol/L (ref 9–35)

## 2024-02-06 LAB — TSH: TSH: 2.464 u[IU]/mL (ref 0.350–4.500)

## 2024-02-06 MED ORDER — MUPIROCIN 2 % EX OINT
TOPICAL_OINTMENT | Freq: Every day | CUTANEOUS | Status: DC
  Filled 2024-02-06: qty 22

## 2024-02-06 MED ORDER — SODIUM CHLORIDE 0.9 % IV SOLN
2.0000 g | INTRAVENOUS | Status: DC
  Administered 2024-02-06 – 2024-02-07 (×5): 2 g via INTRAVENOUS
  Filled 2024-02-06 (×8): qty 2000

## 2024-02-06 MED ORDER — NEPRO/CARBSTEADY PO LIQD: 237.0000 mL | Freq: Three times a day (TID) | ORAL | Status: DC

## 2024-02-06 MED ORDER — BUMETANIDE 1 MG PO TABS
1.0000 mg | ORAL_TABLET | Freq: Every day | ORAL | Status: DC
  Filled 2024-02-06: qty 1

## 2024-02-06 NOTE — Progress Notes (Addendum)
 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  Vital signs in the ED reveal tachycardia.  EKG reveals A-fib RVR with PVCs and LBBB.  CT head was negative for any acute intracranial abnormality.  Chest x-ray with cardiomegaly and mild central congestion.  Patient was started on heparin  infusion and cardiology was consulted.  Subsequently patient had worsening leukocytosis with a nonanion gap metabolic acidosis and elevated BNP over 4000.  Troponin also continued to increase.  Cardiology initiated the patient on IV Bumex  and ordered an echocardiogram.  On 5/6 a rapid response was called and patient became increasingly tachycardic and tachypneic and was found to be febrile at 102.9.  He was also found to have worsening right lower extremity erythema with signs of venous congestion and full-thickness diabetic foot ulcers involving the left foot.  Blood cultures were obtained and patient was initially on broad-spectrum antibiotics.  Blood cultures grew GBS and infectious disease was consulted.  Patient was planned for TEE but it was canceled on 5/9 due to altered mentation.  Patient had TEE on 5/13 which was negative for endocarditis.  Patient remained on heparin  drip until 5/14 at which time cardiology discontinued the medication. Patient's foot wound grew Enterococcus and MRSA believed to be colonization.  IV antibiotics were changed to IV vancomycin  on 5/11.  Patient underwent TEE on 5/13  which was negative for vegetation.  Antibiotics were de-escalated back to cefazolin .  Subjective: No acute issues overnight.  On evaluation today patient is still drowsy and participates minimally in exam.  This with patient's daughter, [name] on the phone.  Objective: Vitals:   02/05/24 1052 02/05/24 1533 02/05/24 2120 02/06/24 0459  BP: (!) 102/57 110/67    Pulse: 100 93    Resp: 17 17    Temp: 98.4 F (36.9 C) 98.3 F (36.8 C) 98 F (36.7 C)   TempSrc:      SpO2: 100% 96%    Weight:    102.4 kg  Height:        Intake/Output Summary (Last 24 hours) at 02/06/2024 0949 Last data filed at 02/06/2024 0353 Gross per 24 hour  Intake 240 ml  Output 850 ml  Net -610 ml   Filed Weights   02/04/24 0500 02/05/24 0500 02/06/24 0459  Weight: 108 kg 107.6 kg 102.4 kg    Examination: General exam: Calm, uncomfortable.  Mumbling unintelligibly Respiratory system: No work of breathing, symmetric chest wall expansion Cardiovascular system: S1 & S2 heard, RRR.  Gastrointestinal system: Abdomen is nondistended, soft and nontender.  Neuro: Drowsy but arousable.  Speech is unintelligible at times  Assessment & Plan:  Principal Problem:   NSTEMI (non-ST elevated myocardial infarction) (HCC) Active Problems:   Acute kidney injury superimposed on stage 3a chronic kidney disease (HCC) - baseline scr 1.3   Septicemia due to group B Streptococcus (HCC)   Sepsis due to cellulitis (HCC)   Acute on chronic systolic CHF (congestive heart failure) (HCC)   Type II diabetes mellitus with renal manifestations (HCC)   Dyslipidemia   Essential hypertension  Pressure injury of skin   Pseudogout of left knee   Ulcerated, foot, right, with fat layer exposed (HCC)   Ulcerated, foot, left, with fat layer exposed (HCC)   Chronic respiratory failure with hypoxia (HCC)   Effusion of left knee   Bacteremia due to group B Streptococcus   Diabetic ulcer of right foot associated with type 2 diabetes  mellitus, with fat layer exposed (HCC)    Sepsis - Septic criteria met with tachycardia, fever, leukocytosis.  Source: Bacteremia  Group B strep bacteremia - Multiple attempts for TEE during this admission.  Completed 5/13.  Negative for vegetations.  No indication to remove pacer wires at this time - Infectious disease consulted and following. - Given patient's persistent encephalopathy we have changed Rocephin  to ampicillin today.  Will need to treat for 6 weeks to treat for presumptive endocarditis. - Podiatry consulted to evaluate foot wounds but does not believe that these are acutely infected at this time. - Repeat blood cultures remain negative.  Metabolic Encephalopathy -- Persistently altered mentation, greater than expected.  Suspect there is some role of hospital delirium complicated by sepsis, but would have anticipated slow resolution with infection control.  - At baseline patient holds a 10-hour shift at [employer] - He was independently managing all ADLs and health care prior to this admission --Head CT negative on arrival.  Given persistent of altered mentation we will broaden his workup. - Ideally patient needs MRI, however he is unlikely to remain still for this exam and is not an excellent candidate for sedation given his tenuous respiratory status and encephalopathy.  Additionally he does have a pacemaker in place and will likely require transfer to [facility] for this MRI.  Will proceed with CT scan today. - Have also discussed with infectious disease, though encephalopathy from Rocephin  is rare, we will trial ampicillin and monitor for improvement in mental status.  -- Have broadened workup with B12, B1, HIV, RPR, ammonia, TSH levels - Labs otherwise remained stable.  No significant electrolyte abnormalities.  BUN is elevated and there may be some component of uremia though BUN is actually lowest has been during this entire admission. - Continue frequent reorientation,  continue delirium precautions  Foot wounds - Foot wounds growing MRSA and Enterococcus.  ID consulted and believes this is colonization - Patient has been following closely with podiatry in the clinic - Reengaged podiatry this admission, at this time they do not believe any further debridement or surgical intervention is necessary.  Have provided local wound care instructions  NSTEMI - Troponins elevated and trending 69 > 105 > 326 > 714 > 1300 > 1800.  -- Has been on heparin  drip, cardiology discontinued today. - Patient does require ischemic evaluation though it appears cardiology is planning to do this as an outpatient. - Now, continue with aspirin  and statin - Continue ARB and beta-blocker. - Continue titrating diuretics as below - TEE negative for endocarditis.  Acute on chronic heart failure reduced EF - Echo this admission with EF 25 to 30%, global hypokinesis, moderate RV dysfunction, severely elevated PASP, eccentric moderate to severe MR, low-flow low gradient mild to moderate aortic stenosis - Heart failure team was managing, has since signed off - Cardiology still following - On Bumex , appears to becoming clinically dry.  Have decreased Bumex  dosing today - Continue to follow strict I's and O's - Has had impressive diuresis started this admission.  Pacemaker status Atrial fibrillation - [employer] dual pacemaker placed 08/2020 - Continue current  medications with beta-blocker - Patient was previously on Eliquis  though this was discontinued in 2023 due to hematuria.  Cardiology does recommend anticoagulation but patient has historically deferred due to bleeding episodes - Have discussed stroke risk with daughter [name] again. - EP has been consulted.  TEE without evidence of lead vegetation. - Keep potassium above 4, mag above 2  CKD stage IIIa - Baseline creatinine appears to be 1.3 - Currently creatinine is close to that - Will continue monitoring renal function  closely - Managing diuresis as above  Pseudogout left knee - 5/9 arthrocentesis with orthopedic surgery.  Consistent with pseudogout. -Continue with pain management.  Patient denies pain when asked but family endorses signs of pain.  Hypertension - Continue daily monitoring and medication titration  Dyslipidemia - Continue statin  Type 2 diabetes with renal manifestations - Continue sliding scale insulin , basal/bolus.  Titrate as needed - Home dose glipizide  and metformin  on hold -- A1c 6.9%  Chronic Foley Catheter -- Replaced 5/15.  Intermittent tachypnea - Continues to maintain O2 sats on room air.  CXR with some vascular congestion and possible left-sided infiltrates. - Suspect tachypnea may be at least in some part anxiety or central related - Continue close monitoring of respiratory status and on telemetry - 2 L O2 for comfort - Continue IV diuresis as above - If persistently tachypneic or hypoxic will consider chest CT. Not tachycardic  Poor prognosis - In light of patient's multiple comorbidities, active infections, poorly healing foot ulcers, CKD, decompensated heart failure patient overall has poor prognosis.  I do have some hope that patient can regain some of his mental status as it appears he was functional for most ADLs prior to arrival.  Further encephalopathy workup as above. At minimum suspect patient will require SNF placement, TOC is working on placement.  If patient has no improvement in mental status he is ultimately a hospice candidate.  Family is open to this option pending further encephalopathy workup as above. Palliative care has been consulted and is assisting in GOC conversations.  Patient's wishes are clear that he would not want artificial nutrition.  At this time his worsening encephalopathy has made him unlikely to continue tolerating p.o., has been seen by SLP and nutrition.  Currently on dysphagia 1 diet and thickened liquids.  Remains high risk of  aspiration.  DVT prophylaxis: SCDs   Code Status: Limited: Do not attempt resuscitation (DNR) -DNR-LIMITED -Do Not Intubate/DNI  Disposition: Pending further encephalopathy workup.  At minimum will need SNF.  Consultants:  Treatment Team:  Consulting Physician: Venus Ginsberg, MD Consulting Physician: Evertt Hoe, [name]  Procedures:    Antimicrobials:  Anti-infectives (From admission, onward)    Start     Dose/Rate Route Frequency Ordered Stop   02/04/24 1800  cefTRIAXone  (ROCEPHIN ) 2 g in sodium chloride  0.9 % 100 mL IVPB        2 g 200 mL/hr over 30 Minutes Intravenous Every 24 hours 02/03/24 2329     02/02/24 1800  vancomycin  (VANCOREADY) IVPB 1750 mg/350 mL  Status:  Discontinued        1,750 mg 175 mL/hr over 120 Minutes Intravenous Every 24 hours 02/01/24 1729 02/03/24 2328   02/01/24 2015  vancomycin  (VANCOREADY) IVPB 500 mg/100 mL       Placed in "Followed by" Linked Group   500 mg 100 mL/hr over 60 Minutes Intravenous  Once 02/01/24 1727 02/01/24 2245   02/01/24 1815  vancomycin  (VANCOREADY) IVPB 1750 mg/350 mL  Placed in "Followed by" Linked Group   1,750 mg 175 mL/hr over 120 Minutes Intravenous  Once 02/01/24 1727 02/01/24 2017   01/28/24 1600  vancomycin  (VANCOREADY) IVPB 2000 mg/400 mL  Status:  Discontinued        2,000 mg 200 mL/hr over 120 Minutes Intravenous Every 24 hours 01/27/24 2009 01/28/24 0844   01/28/24 1600  cefTRIAXone  (ROCEPHIN ) 2 g in sodium chloride  0.9 % 100 mL IVPB  Status:  Discontinued        2 g 200 mL/hr over 30 Minutes Intravenous Daily 01/28/24 0844 02/01/24 1728   01/28/24 0800  ceFEPIme  (MAXIPIME ) 2 g in sodium chloride  0.9 % 100 mL IVPB  Status:  Discontinued        2 g 200 mL/hr over 30 Minutes Intravenous Every 12 hours 01/27/24 1950 01/28/24 0844   01/27/24 2030  ceFEPIme  (MAXIPIME ) 2 g in sodium chloride  0.9 % 100 mL IVPB        2 g 200 mL/hr over 30 Minutes Intravenous  Once 01/27/24 1942 01/27/24 2242    01/27/24 2030  metroNIDAZOLE  (FLAGYL ) IVPB 500 mg  Status:  Discontinued        500 mg 100 mL/hr over 60 Minutes Intravenous Every 12 hours 01/27/24 1942 01/28/24 0844   01/27/24 2030  vancomycin  (VANCOCIN ) IVPB 1000 mg/200 mL premix  Status:  Discontinued        1,000 mg 200 mL/hr over 60 Minutes Intravenous  Once 01/27/24 1942 01/27/24 1948   01/27/24 1945  vancomycin  (VANCOREADY) IVPB 2000 mg/400 mL        2,000 mg 200 mL/hr over 120 Minutes Intravenous  Once 01/27/24 1948 01/27/24 2304   01/26/24 2300  doxycycline  (VIBRA -TABS) tablet 100 mg  Status:  Discontinued        100 mg Oral 2 times daily 01/26/24 2254 01/26/24 2346       Data Reviewed: I have personally reviewed following labs and imaging studies CBC: Recent Labs  Lab 02/02/24 0400 02/03/24 0351 02/04/24 0618 02/05/24 0233 02/06/24 0419  WBC 9.4 11.2* 11.1* 10.8* 13.5*  HGB 11.1* 11.2* 11.5* 11.1* 11.8*  HCT 35.4* 35.9* 37.7* 35.4* 38.5*  MCV 86.3 86.9 86.7 85.5 86.5  PLT 216 234 265 273 297   Basic Metabolic Panel: Recent Labs  Lab 02/02/24 0400 02/03/24 0351 02/04/24 0618 02/05/24 0233 02/06/24 0419  NA 139 139 141 140 144  K 3.8 3.8 3.8 3.8 3.8  CL 104 106 108 111 111  CO2 21* 21* 21* 21* 22  GLUCOSE 171* 189* 197* 205* 194*  BUN 74* 67* 63* 62* 63*  CREATININE 1.41* 1.46* 1.34* 1.21 1.34*  CALCIUM  8.8* 8.9 9.3 8.8* 9.1  MG  --  2.5*  --   --   --    GFR: Estimated Creatinine Clearance: 57.8 mL/min (A) (by C-G formula based on SCr of 1.34 mg/dL (H)). Liver Function Tests: No results for input(s): "AST", "ALT", "ALKPHOS", "BILITOT", "PROT", "ALBUMIN" in the last 168 hours. CBG: Recent Labs  Lab 02/05/24 0818 02/05/24 1052 02/05/24 1558 02/05/24 2029 02/06/24 0743  GLUCAP 207* 272* 255* 180* 208*    Recent Results (from the past 240 hours)  Culture, blood (x 2)     Status: Abnormal   Collection Time: 01/27/24  8:43 PM   Specimen: BLOOD  Result Value Ref Range Status   Specimen  Description   Final    BLOOD BLOOD LEFT ARM LAC Performed at [facility], [address] [place], [place]  [address]    Special Requests   Final    BOTTLES DRAWN AEROBIC AND ANAEROBIC Blood Culture adequate volume Performed at [facility], [address] [place], [place] [address]    Culture  Setup Time   Final    GRAM POSITIVE COCCI IN BOTH AEROBIC AND ANAEROBIC BOTTLES CRITICAL RESULT CALLED TO, READ BACK BY AND VERIFIED WITH: TREY GREENWOOD 01/28/24 0811 MW GRAM STAIN REVIEWED-AGREE WITH RESULT DRT    Culture (A)  Final    GROUP B STREP(S.AGALACTIAE)ISOLATED SUSCEPTIBILITIES PERFORMED ON PREVIOUS CULTURE WITHIN THE LAST 5 DAYS. Performed at [facility], [address], [place], [place] [address]    Report Status 01/30/2024 FINAL  Final  Culture, blood (x 2)     Status: Abnormal   Collection Time: 01/27/24  8:49 PM   Specimen: BLOOD  Result Value Ref Range Status   Specimen Description   Final    BLOOD BLOOD LEFT HAND [facility] Performed at [facility], [address], [place], [place] [address]    Special Requests   Final    BOTTLES DRAWN AEROBIC AND ANAEROBIC Blood Culture adequate volume Performed at [facility], [address] [place], [place] [address]    Culture  Setup Time   Final    GRAM POSITIVE COCCI IN BOTH AEROBIC AND ANAEROBIC BOTTLES Organism ID to follow CRITICAL RESULT CALLED TO, READ BACK BY AND VERIFIED WITH: Kathyanne Parkers GREENWOOD 01/28/24 0811 MW GRAM STAIN REVIEWED-AGREE WITH RESULT DRT Performed at [facility], [address] [place], [place] [address]    Culture GROUP B STREP(S.AGALACTIAE)ISOLATED (A)  Final   Report Status 01/30/2024 FINAL  Final   Organism ID, Bacteria GROUP B STREP(S.AGALACTIAE)ISOLATED  Final      Susceptibility   Group b strep(s.agalactiae)isolated - MIC*    CLINDAMYCIN RESISTANT Resistant     AMPICILLIN <=0.25 SENSITIVE Sensitive     ERYTHROMYCIN >=8 RESISTANT Resistant      VANCOMYCIN  0.5 SENSITIVE Sensitive     CEFTRIAXONE  <=0.12 SENSITIVE Sensitive     LEVOFLOXACIN  0.5 SENSITIVE Sensitive     PENICILLIN <=0.06 SENSITIVE Sensitive     * GROUP B STREP(S.AGALACTIAE)ISOLATED  Blood Culture ID Panel (Reflexed)     Status: Abnormal   Collection Time: 01/27/24  8:49 PM  Result Value Ref Range Status   Enterococcus faecalis NOT DETECTED NOT DETECTED Final   Enterococcus Faecium NOT DETECTED NOT DETECTED Final   Listeria monocytogenes NOT DETECTED NOT DETECTED Final   Staphylococcus species NOT DETECTED NOT DETECTED Final   Staphylococcus aureus (BCID) NOT DETECTED NOT DETECTED Final   Staphylococcus epidermidis NOT DETECTED NOT DETECTED Final   Staphylococcus lugdunensis NOT DETECTED NOT DETECTED Final   Streptococcus species DETECTED (A) NOT DETECTED Final    Comment: CRITICAL RESULT CALLED TO, READ BACK BY AND VERIFIED WITH: TREY GREENWOOD 01/28/24 0811 MW    Streptococcus agalactiae DETECTED (A) NOT DETECTED Final    Comment: CRITICAL RESULT CALLED TO, READ BACK BY AND VERIFIED WITH: TREY GREENWOOD 01/28/24 0811 MW    Streptococcus pneumoniae NOT DETECTED NOT DETECTED Final   Streptococcus pyogenes NOT DETECTED NOT DETECTED Final   A.calcoaceticus-baumannii NOT DETECTED NOT DETECTED Final   Bacteroides fragilis NOT DETECTED NOT DETECTED Final   Enterobacterales NOT DETECTED NOT DETECTED Final   Enterobacter cloacae complex NOT DETECTED NOT DETECTED Final   Escherichia coli NOT DETECTED NOT DETECTED Final   Klebsiella aerogenes NOT DETECTED NOT DETECTED Final   Klebsiella oxytoca NOT DETECTED NOT DETECTED  Final   Klebsiella pneumoniae NOT DETECTED NOT DETECTED Final   Proteus species NOT DETECTED NOT DETECTED Final   Salmonella species NOT DETECTED NOT DETECTED Final   Serratia marcescens NOT DETECTED NOT DETECTED Final   Haemophilus influenzae NOT DETECTED NOT DETECTED Final   Neisseria meningitidis NOT DETECTED NOT DETECTED Final   Pseudomonas aeruginosa  NOT DETECTED NOT DETECTED Final   Stenotrophomonas maltophilia NOT DETECTED NOT DETECTED Final   Candida albicans NOT DETECTED NOT DETECTED Final   Candida auris NOT DETECTED NOT DETECTED Final   Candida glabrata NOT DETECTED NOT DETECTED Final   Candida krusei NOT DETECTED NOT DETECTED Final   Candida parapsilosis NOT DETECTED NOT DETECTED Final   Candida tropicalis NOT DETECTED NOT DETECTED Final   Cryptococcus neoformans/gattii NOT DETECTED NOT DETECTED Final    Comment: Performed at [facility], [address] [place], [place] [address]  MRSA Next Gen by PCR, Nasal     Status: None   Collection Time: 01/28/24  1:00 AM   Specimen: Nasal Mucosa; Nasal Swab  Result Value Ref Range Status   MRSA by PCR Next Gen NOT DETECTED NOT DETECTED Final    Comment: (NOTE) The GeneXpert MRSA Assay (FDA approved for NASAL specimens only), is one component of a comprehensive MRSA colonization surveillance program. It is not intended to diagnose MRSA infection nor to guide or monitor treatment for MRSA infections. Test performance is not FDA approved in patients less than 52 years old. Performed at [facility], [address] [place], [place] [address]   Aerobic Culture w Gram Stain (superficial specimen)     Status: None   Collection Time: 01/29/24  6:35 AM   Specimen: Foot; Wound  Result Value Ref Range Status   Specimen Description   Final    FOOT RIGHT Performed at [facility], [address], [place], [place] [address]    Special Requests   Final    NONE Performed at [facility], [address] [place], [place] [address]    Gram Stain NO WBC SEEN RARE GRAM POSITIVE COCCI IN PAIRS   Final   Culture   Final    RARE STAPHYLOCOCCUS AUREUS SUSCEPTIBILITIES PERFORMED ON PREVIOUS CULTURE WITHIN THE LAST 5 DAYS. ABUNDANT CORYNEBACTERIUM STRIATUM Standardized susceptibility testing for this organism is not available. Performed at [facility], [address], [place], [place] [address]    Report Status 02/01/2024 FINAL  Final  Aerobic Culture w Gram Stain (superficial specimen)     Status: None   Collection Time: 01/29/24  6:37 AM   Specimen: Foot; Wound  Result Value Ref Range Status   Specimen Description   Final    FOOT LEFT Performed at [facility], [address], [place], [place] [address]    Special Requests   Final    NONE Performed at [facility], [address] [place], [place] [address]    Gram Stain   Final    NO WBC SEEN RARE GRAM POSITIVE COCCI IN PAIRS Performed at [facility], [address], [place], [place] [address]    Culture   Final    RARE METHICILLIN RESISTANT STAPHYLOCOCCUS AUREUS RARE ENTEROCOCCUS FAECALIS RARE GROUP B STREP(S.AGALACTIAE)ISOLATED TESTING AGAINST S. AGALACTIAE NOT ROUTINELY PERFORMED DUE TO PREDICTABILITY OF AMP/PEN/VAN SUSCEPTIBILITY. RARE FUNGUS (MOLD) ISOLATED, PROBABLE CONTAMINANT/COLONIZER (SAPROPHYTE). CONTACT MICROBIOLOGY IF FURTHER IDENTIFICATION REQUIRED [phone].    Report Status 02/01/2024 FINAL  Final   Organism ID, Bacteria METHICILLIN  RESISTANT STAPHYLOCOCCUS AUREUS  Final   Organism ID, Bacteria ENTEROCOCCUS FAECALIS  Final      Susceptibility   Enterococcus faecalis - MIC*    AMPICILLIN <=2 SENSITIVE Sensitive     VANCOMYCIN  1 SENSITIVE Sensitive     GENTAMICIN  SYNERGY SENSITIVE Sensitive     * RARE ENTEROCOCCUS FAECALIS   Methicillin resistant staphylococcus aureus - MIC*    CIPROFLOXACIN  >=8 RESISTANT Resistant     ERYTHROMYCIN >=8 RESISTANT Resistant     GENTAMICIN  <=0.5 SENSITIVE Sensitive     OXACILLIN >=4 RESISTANT Resistant     TETRACYCLINE <=1 SENSITIVE Sensitive     VANCOMYCIN  1 SENSITIVE Sensitive     TRIMETH /SULFA  <=10 SENSITIVE Sensitive     CLINDAMYCIN <=0.25 SENSITIVE Sensitive     RIFAMPIN <=0.5 SENSITIVE Sensitive     Inducible Clindamycin NEGATIVE Sensitive     LINEZOLID 2 SENSITIVE Sensitive      * RARE METHICILLIN RESISTANT STAPHYLOCOCCUS AUREUS  Culture, blood (Routine X 2) w Reflex to ID Panel     Status: None   Collection Time: 01/29/24  6:11 PM   Specimen: BLOOD  Result Value Ref Range Status   Specimen Description BLOOD BLOOD RIGHT HAND  Final   Special Requests   Final    BOTTLES DRAWN AEROBIC AND ANAEROBIC Blood Culture adequate volume   Culture   Final    NO GROWTH 5 DAYS Performed at [facility], [address] [place], [place] [address]    Report Status 02/03/2024 FINAL  Final  Culture, blood (Routine X 2) w Reflex to ID Panel     Status: None   Collection Time: 01/29/24  7:42 PM   Specimen: BLOOD  Result Value Ref Range Status   Specimen Description BLOOD BLOOD RIGHT HAND  Final   Special Requests   Final    BOTTLES DRAWN AEROBIC AND ANAEROBIC Blood Culture adequate volume   Culture   Final    NO GROWTH 5 DAYS Performed at [facility], [address] [place], [place] [address]    Report Status 02/03/2024 FINAL  Final  Body fluid culture w Gram Stain     Status: None   Collection Time: 01/30/24  4:00 PM   Specimen: Synovium; Body Fluid  Result Value Ref Range Status   Specimen Description   Final    SYNOVIAL Performed at [facility], [address] [place], [place] [address]    Special Requests   Final    L KNEE Performed at [facility], [address] [place], [place] [address]    Gram Stain   Final    MODERATE WBC PRESENT, PREDOMINANTLY PMN NO ORGANISMS SEEN    Culture   Final    NO GROWTH 3 DAYS Performed at [facility], [address] [place], [place] [address]    Report Status 02/04/2024 FINAL  Final     Radiology Studies: DG Chest Port 1 View Result Date: 02/05/2024 CLINICAL DATA:  Tachypnea EXAM: PORTABLE CHEST 1 VIEW COMPARISON:  Jan 26, 2024 FINDINGS: No change in the left subclavian bipolar ICDF pacemaker device tip of the leads in good position no evidence of discontinuity. Mild  bilateral interstitial prominence with some infiltrates or atelectasis of the left upper lobe could correlate with congestive changes with perhaps superimposed pneumonia or pneumonic infiltrates without significant consolidation Heart and mediastinum normal No pleural effusions IMPRESSION: Mild bilateral interstitial prominence with some infiltrates or atelectasis of the left upper lobe could correlate with  congestive changes with perhaps superimposed pneumonia or pneumonic infiltrates without significant consolidation. Electronically Signed   By: Fredrich Jefferson M.D.   On: 02/05/2024 16:18   ECHO TEE Result Date: 02/05/2024    TRANSESOPHOGEAL ECHO REPORT   Patient Name:   KAYMEN SUPPES Date of Exam: 02/04/2024 Medical Rec #:  [id number]      Height:       78.0 in Accession #:    [id number]     Weight:       238.1 lb Date of Birth:  1946/05/20      BSA:          2.431 m Patient Age:    77 years       BP:           118/67 mmHg Patient Gender: M              HR:           94 bpm. Exam Location:  [facility] Procedure: Transesophageal Echo, Color Doppler and Cardiac Doppler (Both            Spectral and Color Flow Doppler were utilized during procedure). Indications:     Not listed on TEE check-in sheet  History:         Patient has prior history of Echocardiogram examinations, most                  recent 01/29/2024. Pacemaker; Risk Factors:Hypertension and                  Dyslipidemia.  Sonographer:     Broadus Canes Referring Phys:  [id number] CARALYN HUDSON Diagnosing Phys: Lanell Pinta Custovic PROCEDURE: The transesophogeal probe was passed without difficulty through the esophogus of the patient. Sedation performed by different physician. [name] vital signs; including heart rate, blood pressure, and oxygen saturation; remained stable throughout the procedure. The patient developed no complications during the procedure.  IMPRESSIONS  1. Left ventricular ejection fraction, by estimation, is 25 to 30%. The left ventricle has  severely decreased function. The left ventricle demonstrates global hypokinesis. Left ventricular diastolic function could not be evaluated.  2. Right ventricular systolic function is normal. The right ventricular size is normal.  3. No left atrial/left atrial appendage thrombus was detected.  4. The mitral valve is normal in structure. Moderate to severe mitral valve regurgitation. No evidence of mitral stenosis.  5. The aortic valve is normal in structure. Aortic valve regurgitation is not visualized. Mild aortic valve stenosis.  6. The inferior vena cava is normal in size with greater than 50% respiratory variability, suggesting right atrial pressure of 3 mmHg. Conclusion(s)/Recommendation(s): No evidence of vegetation/infective endocarditis on this transesophageael echocardiogram. FINDINGS  Left Ventricle: Left ventricular ejection fraction, by estimation, is 25 to 30%. The left ventricle has severely decreased function. The left ventricle demonstrates global hypokinesis. The left ventricular internal cavity size was normal in size. There is no left ventricular hypertrophy. Left ventricular diastolic function could not be evaluated. Right Ventricle: The right ventricular size is normal. No increase in right ventricular wall thickness. Right ventricular systolic function is normal. Left Atrium: Left atrial size was normal in size. No left atrial/left atrial appendage thrombus was detected. Right Atrium: Right atrial size was normal in size. Pericardium: There is no evidence of pericardial effusion. Mitral Valve: The mitral valve is normal in structure. Moderate to severe mitral valve regurgitation. No evidence of mitral valve stenosis. Tricuspid Valve: The tricuspid valve is normal in structure.  Tricuspid valve regurgitation is mild. Aortic Valve: The aortic valve is normal in structure. Aortic valve regurgitation is not visualized. Mild aortic stenosis is present. Pulmonic Valve: The pulmonic valve was normal in  structure. Pulmonic valve regurgitation is not visualized. Aorta: The aortic root is normal in size and structure. Venous: The inferior vena cava is normal in size with greater than 50% respiratory variability, suggesting right atrial pressure of 3 mmHg. IAS/Shunts: No atrial level shunt detected by color flow Doppler. Sabina Custovic Electronically signed by Isabell Manzanilla Signature Date/Time: 02/05/2024/4:10:45 PM    Final     Scheduled Meds:  vitamin C   500 mg Oral BID   aspirin   81 mg Oral Daily   atorvastatin   80 mg Oral q1800   [START ON 02/02/2024] bumetanide   1 mg Oral Daily   Chlorhexidine  Gluconate Cloth  6 each Topical Daily   feeding supplement (NEPRO CARB STEADY)  237 mL Oral BID BM   gentamicin  cream  1 Application Topical BID   insulin  aspart  0-15 Units Subcutaneous TID WC   insulin  aspart  0-5 Units Subcutaneous QHS   insulin  glargine-yfgn  5 Units Subcutaneous QHS   losartan   12.5 mg Oral Daily   metoprolol  succinate  25 mg Oral Daily   multivitamin with minerals  1 tablet Oral Daily   mupirocin  ointment   Topical Daily   silver  sulfADIAZINE    Topical Daily   Continuous Infusions:  cefTRIAXone  (ROCEPHIN )  IV 2 g (02/05/24 1932)     LOS: 9 days  MDM: Patient is high risk for one or more organ failure.  They necessitate ongoing hospitalization for continued IV therapies and subsequent lab monitoring. Total time spent interpreting labs and vitals, reviewing the medical record, coordinating care amongst consultants and care team members, directly assessing and discussing care with the patient and/or family: 55 min  Joaquina Nissen, DO [facility]  To contact the attending physician between 7A-7P please use Epic Chat. To contact the covering physician during after hours 7P-7A, please review Amion.  02/06/2024, 9:49 AM   *This document has been created with the assistance of dictation software. Please excuse typographical errors. *

## 2024-02-06 NOTE — Progress Notes (Signed)
 Date of Admission:  01/26/2024      ID: Tyler [name] is a 78 y.o. 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)   Type II diabetes mellitus with renal manifestations (HCC)   Ulcerated, foot, right, with fat layer exposed (HCC)   Ulcerated, foot, left, with fat layer exposed (HCC)   Dyslipidemia   Essential hypertension   Acute kidney injury superimposed on stage 3a chronic kidney disease (HCC) - baseline scr 1.3   Pressure injury of skin   Septicemia due to group B Streptococcus (HCC)   Chronic respiratory failure with hypoxia (HCC)   Effusion of left knee   Pseudogout of left knee   Bacteremia due to group B Streptococcus   Diabetic ulcer of right foot associated with type 2 diabetes mellitus, with fat layer exposed (HCC)   Malnutrition of moderate degree    Subjective: Patient is pleasant On calling his name he opens his eyes but just goes back to sleep again  Medications:   vitamin C   500 mg Oral BID   aspirin   81 mg Oral Daily   atorvastatin   80 mg Oral q1800   [START ON 02/19/2024] bumetanide   1 mg Oral Daily   Chlorhexidine  Gluconate Cloth  6 each Topical Daily   feeding supplement (NEPRO CARB STEADY)  237 mL Oral TID BM   gentamicin  cream  1 Application Topical BID   insulin  aspart  0-15 Units Subcutaneous TID WC   insulin  aspart  0-5 Units Subcutaneous QHS   insulin  glargine-yfgn  5 Units Subcutaneous QHS   losartan   12.5 mg Oral Daily   metoprolol  succinate  25 mg Oral Daily   multivitamin with minerals  1 tablet Oral Daily   mupirocin  ointment   Topical Daily   silver  sulfADIAZINE    Topical Daily    Objective: Vital signs in last 24 hours: Patient Vitals for the past 24 hrs:  Temp Weight  02/06/24 0459 -- 102.4 kg  02/05/24 2120 98 F (36.7 C) --       PHYSICAL EXAM:  General: somnolent lungs:  Bilateral air entry.  crepts Heart: Irregular Abdomen: Soft, non-tender,not distended. Bowel sounds normal. No masses Extremities: Left knee swelling improved Edema legs better Foot dressing removed and wounds were examined.  Much improved                Skin: Venous stasis pigmentation extremities   lymph: Cervical, supraclavicular normal. Neurologic: Cannot assess  Lab Results    Latest Ref Rng & Units 02/06/2024    4:19 AM 02/05/2024    2:33 AM 02/04/2024    6:18 AM  CBC  WBC 4.0 - 10.5 K/uL 13.5  10.8  11.1   Hemoglobin 13.0 - 17.0 g/dL 96.0  45.4  09.8   Hematocrit 39.0 - 52.0 % 38.5  35.4  37.7   Platelets 150 - 400 K/uL 297  273  265        Latest Ref Rng & Units 02/06/2024    4:19 AM 02/05/2024    2:33 AM 02/04/2024    6:18 AM  CMP  Glucose 70 - 99 mg/dL 119  147  829   BUN 8 - 23 mg/dL 63  62  63   Creatinine 0.61 - 1.24 mg/dL 5.62  1.30  8.65   Sodium 135 - 145 mmol/L 144  140  141   Potassium 3.5 - 5.1 mmol/L 3.8  3.8  3.8   Chloride 98 - 111 mmol/L 111  111  108   CO2 22 - 32 mmol/L 22  21  21    Calcium  8.9 - 10.3 mg/dL 9.1  8.8  9.3       Microbiology: 01/27/2024 blood culture group B streptococcus BC: 01/29/2024 no growth     Assessment/Plan:  Encephalopathy: Unclear reason Multiple labs have been sent and so his CAT scan of the head Will also need an ABG to rule out CO2 narcosis Patient is on ceftriaxone  which usually does not cause encephalopathy cases have been reported so we will change it to ampicillin  Group B streptococcus bacteremia with high bioburden 4 out of 4 Patient has been on ceftriaxone  Repeat blood cultures have been negative TEE did not show any vegetation 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 The source of the group B streptococcus was his foot wounds   CHF, NSTEMI, A-fib Management as per primary team The  ampicillin fluid load would be 600 cc as compared to 100 cc with ceftriaxone .  But as per hospitalist patient appears to be little dry and hence will observe him closely on IV ampicillin infusion.  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  No need to treat the MRSA or Enterococcus.  His wound is doing much better with ceftriaxone  and offloading his feet and not putting pressure.   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  Status post aspiration.  Culture NG   Discussed the management with the hospitalist

## 2024-02-06 NOTE — Progress Notes (Signed)
 Palliative Care Progress Note, Assessment & Plan   Patient Name: Tyler [name]       Date: 02/06/2024 DOB: 1946/06/13  Age: 78 y.o. MRN#: [id number] Attending Physician: Dezii, Alexandra, DO Primary Care Physician: McClanahan, Kyra, NP Admit Date: 01/26/2024  Subjective: Patient is sitting up in bed, sleeping, in no apparent distress.  He awakens to light physical touch and loud verbal stimuli.  He meets my gaze but then quickly falls back to sleep.  SLP and dietitian are at bedside during my visit.  HPI: 78 y.o. male  with past medical history of HFpEF, A-fib/flutter, AV block s/p PPM (2021), CAD, CKD stage III, hypertension and hyperlipidemia admitted from home on 01/26/2024 with dizziness, lightheadedness and confusion.  Also reported dull chest pain.   EKG on arrival showed A-fib with RVR, PVCs and L BBB Elevated troponin and significantly elevated BNP Chest x-ray with cardiomegaly and congestion   During this admission found to have positive blood cultures growing group B streptococcus, ID consulted Concern for endocarditis and or pacing wire vegetation as well as concern for left knee septic arthritis S/p left knee aspiration   Advanced heart failure team also following   Palliative medicine was consulted for assisting with goals of care conversations  Summary of counseling/coordination of care: Extensive chart review completed prior to meeting patient including labs, vital signs, imaging, progress notes, orders, and available advanced directive documents from current and previous encounters.   After reviewing the patient's chart and assessing the patient at bedside, I spoke with patient in regards to symptom management and goals of care.   When I was able to keep the patient awake, he kept his  eyes closed.  He answered some questions appropriately.  However, he is unable to participate in full system assessment or goals of care/medical decision making independently at this time.  He denies pain, discomfort, chest pain, headache, or other acute ailments at this time.  No adjustment to [facility] needed.  Counseled with speech and language therapist as well as registered dietitian.  Patient has significant residue in his mouth, leaving him at high risk for aspiration.  He is diet has been downgraded to pured and thickened liquids.  However, he only stays awake for 1 or 2 bites before quickly returning back to sleep.  After visiting with patient, I counseled with his daughter [name] over the phone.  Medical update given.  Shared concern the patient's mentation has continued to be poor with lethargy and cognitive decline.  Attending aware and is ordered a CT scan to evaluate neurological status.  [name] is appreciative of this information as she has been concerned with the significant change in his cognitive status during hospitalization as well.  Discussed significance of cognitive, nutritional, and functional status.  Reviewed these key factors contribute to patient's overall prognosis, which is poor at this time.  She shares understanding.  Reviewed that if patient is unable to eat and drink on his own, patient and family would never be accepting of artificial nutrition or hydration.  Patient and family would never want placement of an NG/Dobbhoff/PEG tube.  In light of patient's poor cognitive, functional, nutritional status, we discussed potential for enacting hospice benefits.  [name] shares she would like to continue workup to see if there are any acute issues that have contributed to patient's current medical situation.  Awaiting results of CT scan.  However, she appreciates and understands the value of enacting hospice benefits at the appropriate time.  Awaiting results of CT scan to  determine next steps and plan of care.  DNR with limited interventions remains.  PMT will continue to follow and support patient and family throughout his hospitalization.  Physical Exam Vitals reviewed.  Constitutional:      General: He is not in acute distress.    Appearance: He is not toxic-appearing.  HENT:     Head: Normocephalic.  Cardiovascular:     Rate and Rhythm: Normal rate.  Skin:    General: Skin is warm and dry.  Neurological:     Mental Status: He is alert.     Comments: Oriented to self, lethargic  Psychiatric:        Mood and Affect: Mood is not anxious.        Behavior: Behavior is not agitated.             Total Time 50 minutes   Time spent includes: Detailed review of medical records (labs, imaging, vital signs), medically appropriate exam (mental status, respiratory, cardiac, skin), discussed with treatment team, counseling and educating patient, family and staff, documenting clinical information, medication management and coordination of care.  Tyler Nose L. Rebbeca Campi, DNP, FNP-BC Palliative Medicine Team

## 2024-02-06 NOTE — Progress Notes (Signed)
 Initial Nutrition Assessment  DOCUMENTATION CODES:   Non-severe (moderate) malnutrition in context of chronic illness  INTERVENTION:   -Continue Nepro Shake po TID, each supplement provides 425 kcal and 19 grams protein  -Continue MVI with minerals daily -Continue 500 mg vitamin C  BID -Magic cup TID with meals, each supplement provides 290 kcal and 9 grams of protein  -Feeding assistance with meals -If pt desires full scope care and this is in alignment with goals of care, consider alternative means of nutrition and hydration:  Initiate Osmolite 1.5 @ 25 ml/hr and increase by 10 ml every 12 hours to goal rate of 65 ml/hr.   60 ml Prosource TF 20 BID  30 ml free ewtaer flush every 4 hours  Tube feeding regimen provides 2464 kcal (100% of needs), 138 grams of protein, and 1189 ml of H2O.  Total free water : 1369 ml daily  -If feedings are initiated, pt is at high refeeding risk secondary to poor oral intake and malnutrition. Recommend:  -100 mg thiamine daily x 7 days -Monitor Mg, K, and Phos and replete as appropriate  NUTRITION DIAGNOSIS:   Moderate Malnutrition related to chronic illness (CHF) as evidenced by mild fat depletion, moderate fat depletion, mild muscle depletion, moderate muscle depletion, percent weight loss.  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   5/7- carb modified diet 5/9- s/p BSE- dysphagia 3 diet with nectar thick liquids 5/13- s/p TEE, no vegetations 5/15- s/p BSE- dysphagia 1 diet with nectar thick liquids  Reviewed I/O's: -610 ml x 24 hours and -14 L since admission  UOP: 850 ml x 24 hours  Per podiatry notes, no surgical intervention at this time. Continue 6 weeks of IV antibiotics secondary to bacteremia.   Case discussed with SLP, who requested RD evaluation for poor oral intake. Pt very lethargic today and was downgraded to dysphagia 1 diet with nectar thick liquids. Pt only to eat when assistance and supervision is present. Pt has been declining over the past few days with minimal oral intake. SLP also reports coughing and gurgling with oral intake.   Pt lying in bed at time of visit. Pt minimally interactive, but would sometimes answer close ended questions. No family present.   Reviewed wt hx; pt has experienced a 7.8% wt loss over the past 3 months, which is significant for time frame.   Pt meets criteria for moderate malnutrition; suspect acute illness may be worsening presentation of malnutrition. Pharmacy, SLP, and RD discussed concern over limited progress over the past few days. Palliative care present during visit and agreed with concerns. Per palliative care, pt and family do not desire additional tube and lines, including feeding tube, however, will confirm wishes with family today given decline in medical status. MD also to order head CT to evaluate for new CVA and further work-up for encephalopathy.   Medications reviewed and include vitamin C .    Lab Results  Component Value  Date   HGBA1C 6.9 (H) 01/27/2024   PTA DM medications are 10 mg glipizide  daily.   Labs reviewed: CBGS: 180-268 (inpatient orders for glycemic control are 0-15 units insulin  asoart TID with meals, 0-5 units insulin  aspart daily at bedtime, and 5 units insulin  glargine-yfgn daily at bedtime). Noted DM  coordinator notes to increase basal insulin  to 8 units daily.     NUTRITION - FOCUSED PHYSICAL EXAM:  Flowsheet Row Most Recent Value  Orbital Region Moderate depletion  Upper Arm Region Moderate depletion  Thoracic and Lumbar Region Mild depletion  Buccal Region Mild depletion  Temple Region Moderate depletion  Clavicle Bone Region Moderate depletion  Clavicle and Acromion Bone Region Moderate depletion  Scapular Bone Region Moderate depletion  Dorsal Hand Moderate depletion  Patellar Region Mild depletion  Anterior Thigh Region Mild depletion  Posterior Calf Region Mild depletion  Edema (RD Assessment) Moderate  Hair Reviewed  Eyes Reviewed  Mouth Reviewed  Skin Reviewed  Nails Reviewed       Diet Order:   Diet Order             DIET - DYS 1 Room service appropriate? No; Fluid consistency: Nectar Thick  Diet effective now                   EDUCATION NEEDS:   Not appropriate for education at this time  Skin:  Skin Assessment: Skin Integrity Issues: Skin Integrity Issues:: Unstageable, Stage II Stage II: bilateral heels Unstageable: rt heel  Last BM:  02/05/24  Height:   Ht Readings from Last 1 Encounters:  01/26/24 6\' 6"  (1.981 m)    Weight:   Wt Readings from Last 1 Encounters:  02/06/24 102.4 kg    Ideal Body Weight:  97.3 kg  BMI:  Body mass index is 26.09 kg/m.  Estimated Nutritional Needs:   Kcal:  1610-9604  Protein:  115-130 grams  Fluid:  2.0-2.2 L    Herschel Lords, RD, LDN, CDCES Registered Dietitian III Certified Diabetes Care and Education Specialist If unable to reach this RD, please use "RD Inpatient" group chat on secure chat between hours of 8am-4 pm daily

## 2024-02-06 NOTE — Consult Note (Addendum)
 PODIATRY CONSULTATION  NAME Tyler [name] MRN [id number] DOB [birth year] DOA 01/26/2024   Reason for consult:  bilateral foot ulcers  Attending/Consulting physician: A. 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  Eventually had blood cultures with GBS.  Concern for infected pacemaker.  TEE negative for vegetations.  Question of wound infection being source of his bacteremia.  Patient unable to answer many questions he is very somnolent this morning.  His son came in the room while seeing the patient and I discussed that overall the wounds appear healthy without evidence of infection at this time and do not require surgery at this time.  Past Medical History:  Diagnosis Date   (HFpEF) heart failure with preserved ejection fraction (HCC) 03/01/2020   a.) TTE 03/01/2020: EF 55-60%, mod MAC, mod AoV sclerosis, triv AR, mild TR, mod MR, RVSP 50-59; b.) TTE 09/10/2020: EF 55-60%, mod MAC, mod AoV sclerosis, mild TR, 3+ MR, RVSP 50-59; c.) TTE 09/26/2020: EF 55-60%, mild LA dil, triv PR, mild MR/TR, RVSP 37-49   Adenoma of left adrenal gland    Anemia    Arthritis    Atrial fibrillation and flutter (HCC)    a.) CHA2DS2VASc = 5 (age x2, HFpEF, HTN, T2DM);  b.) s/p CTI ablation 09/07/2020; c.) rate/rhythm maintained on oral carvedilol ; not on chronic anticoagulation therapy   CAD (coronary artery disease)    Cardiomegaly    CKD (chronic kidney disease), stage III (HCC)    DOE (dyspnea on exertion)    Drug-induced bradycardia    Gangrene of toe of left foot (HCC)    a.) 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   H/O active  rheumatic fever 08/22/2020   Hepatosplenomegaly    History of bilateral cataract extraction    HLD (hyperlipidemia)    Hypertension    Long term current use of aspirin     Lymphedema of both lower extremities    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)    a.) s/p amputation 11/04/2022   Peripheral vascular disease (HCC)    Pleural effusion on right 09/09/2020   a.) s/p RIGHT thoracentesis with 2180 cc yield   Pneumonia    Presence of permanent cardiac pacemaker 09/10/2020   a.) TVP placement 09/10/2020 due to intermittent CHB in setting of urosepsis; b.) s/p PPM placement 09/15/2020: MDT Azure XT DR (SN: [id number])   Pulmonary hypertension (HCC) 03/01/2020   a.) TTE: 03/01/2020: RVSP 50-59; b.) TTE 09/10/2020: RVSP 50-59; c.) TTE 09/26/2020: RVSP 37-49   RA (rheumatoid arthritis) (HCC)    Rheumatic fever    Sepsis (HCC) 09/10/2020   a.) urosepsis --> BC x 2 sets and UC all grew out significant Proteus mirabilis; admitted to [facility] 09/07/2020 - 09/27/2020.   Sick sinus syndrome [facility])    a.) s/p MDT PPM placement 09/15/2020   T2DM (type 2 diabetes mellitus) (HCC)    Urinary retention    chronic, with indwelling Foley catheter and plans for a suprapubic   Wears dentures    full upper       Latest Ref Rng & Units 02/06/2024    4:19 AM 02/05/2024    2:33 AM 02/04/2024    6:18  AM  CBC  WBC 4.0 - 10.5 K/uL 13.5  10.8  11.1   Hemoglobin 13.0 - 17.0 g/dL 28.4  13.2  44.0   Hematocrit 39.0 - 52.0 % 38.5  35.4  37.7   Platelets 150 - 400 K/uL 297  273  265        Latest Ref Rng & Units 02/06/2024    4:19 AM 02/05/2024    2:33 AM 02/04/2024    6:18 AM  BMP  Glucose 70 - 99 mg/dL 102  725  366   BUN 8 - 23 mg/dL 63  62  63   Creatinine 0.61 - 1.24 mg/dL 4.40  3.47  4.25   Sodium 135 - 145 mmol/L 144  140  141   Potassium 3.5 - 5.1 mmol/L 3.8  3.8  3.8   Chloride 98 - 111 mmol/L 111  111  108   CO2 22 - 32 mmol/L 22  21  21    Calcium  8.9 - 10.3 mg/dL 9.1  8.8   9.3       Physical Exam: Lower Extremity Exam  Bilateral foot toe amputations noted.  Well-healed without evidence of infection  Sensation absent to light touch  Cap refill within normal limits   Left foot ulceration plantar medial midfoot to subcutaneous fat tissue layer healthy granular tissue base hyperkeratotic tissue margin.  No drainage no erythema no malodor   Right foot superficial ulceration to subcutaneous fat tissue layer plantar lateral forefoot as well as hyperkeratotic tissue overlying healed ulceration plantar central midfoot.  No erythema no drainage no evidence of infection    ASSESSMENT/PLAN OF CARE 78 y.o. 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  Continue local wound care follow-up outpatient - ABX per ID, he is being treated with 6 weeks IV antibiotics due to bacteremia.  As far as antibiotics for the foot his ulcers do not appear infected at this time - Anticoagulation: Per primary - Wound care: Recommend mupirocin  ointment daily to the wounds thin layer cover with Xeroform and then wrap foot with Kerlix roll and secured with tape. Order entered.  -Recommend offloading when in bed to prevent heel decubitus ulcerations either float the heels or use Prevalon boots - Patient to follow-up in [place] office or wound care center on discharge for ongoing wound care.  Will sign off at this time   Thank you for the consult.  Please contact me directly  with any questions or concerns.           Maridee Shoemaker, [name] [facility] / [facility]    [address] [place], [place] [address]                Office [phone]  Fax [phone]

## 2024-02-06 NOTE — TOC Progression Note (Addendum)
 Transition of Care [facility]) - Progression Note    Patient Details  Name: Tyler [name] MRN: [id number] Date of Birth: 1946/04/22  Transition of Care [facility]) CM/SW Contact  Odilia Bennett, LCSW Phone Number: 02/06/2024, 12:02 PM  Clinical Narrative:  Daughter's first preference SNF is [facility] in [place]. They have offered a bed and should have availability this weekend or early next week. CSW sent secure chat to attending physician to see how soon patient might be ready for discharge. CSW did notify SNF liaison that patient will likely discharge on 6-8 weeks of IV abx.   12:09 pm: Per attending, patient may be ready for discharge as soon as tomorrow. Earliest that SNF will have a bed is Saturday. SNF will start auth.  Expected Discharge Plan: Home/Self Care Barriers to Discharge: Continued Medical Work up  Expected Discharge Plan and Services     Post Acute Care Choice: NA Living arrangements for the past 2 months: Single Family Home                                       Social Determinants of Health (SDOH) Interventions SDOH Screenings   Food Insecurity: No Food Insecurity (01/27/2024)  Housing: Low Risk  (01/27/2024)  Transportation Needs: No Transportation Needs (01/27/2024)  Utilities: Not At Risk (01/27/2024)  Alcohol Screen: Low Risk  (08/14/2023)  Financial Resource Strain: Low Risk  (03/20/2023)   Received from [facility]  Social Connections: Socially Isolated (01/27/2024)  Stress: No Stress Concern Present (09/07/2020)   Received from [facility], [facility]  Tobacco Use: Low Risk  (01/26/2024)    Readmission Risk Interventions    01/29/2024   12:58 PM 09/12/2023   12:13 PM 08/15/2023    3:57 PM  Readmission Risk Prevention Plan  Transportation Screening Complete Complete Complete  PCP or Specialist Appt within 3-5 Days  Complete Complete  HRI or Home Care Consult  Complete Complete  Social Work Consult for Recovery Care  Planning/Counseling  Complete Complete  Palliative Care Screening  Not Applicable Not Applicable  Medication Review Oceanographer) Complete Complete Complete  PCP or Specialist appointment within 3-5 days of discharge Complete    SW Recovery Care/Counseling Consult Complete    Palliative Care Screening Not Applicable    Skilled Nursing Facility Not Applicable

## 2024-02-06 NOTE — Progress Notes (Signed)
 EP follow-up note -   EP consulted for CIED with strep agalactiae bacteremia.   TEE performed 5/13 without vegetation or infective endocarditis appreciated.   At this time given the patient's co-morbidities and lack of direct involvement of the patient's pacemaker with his infection, do not recommend device extraction.   Recommend ongoing medical management with ID including serial blood cultures.     I called patient's daughter, [name], to discuss this recommendation. She verbalized understanding.    EP will sign off at this time, but remains available. Please re-consult if needed    Kerline Trahan, NP Electrophysiology 02/06/24 11:44 AM

## 2024-02-06 NOTE — Inpatient Diabetes Management (Signed)
 Inpatient Diabetes Program Recommendations  AACE/ADA: New Consensus Statement on Inpatient Glycemic Control (2015)  Target Ranges:  Prepandial:   less than 140 mg/dL      Peak postprandial:   less than 180 mg/dL (1-2 hours)      Critically ill patients:  140 - 180 mg/dL    Latest Reference Range & Units 02/05/24 08:18 02/05/24 10:52 02/05/24 15:58 02/05/24 20:29  Glucose-Capillary 70 - 99 mg/dL 010 (H)  5 units Novolog   272 (H)  8 units Novolog   255 (H)  8 units Novolog  @1802  180 (H)   5 units Semglee  @2120   (H): Data is abnormally high  Latest Reference Range & Units 02/06/24 07:43  Glucose-Capillary 70 - 99 mg/dL 272 (H)  5 units Novolog    (H): Data is abnormally high     Home DM Meds: Glipizide  10 mg BID       Metformin  1000 mg BID (NOT taking)  Current Orders: Semglee  5 units at bedtime     Novolog  Moderate Correction Scale/ SSI (0-15 units) TID AC + HS    MD- Please consider increasing the Semglee  to 8 units at bedtime    --Will follow patient during hospitalization--  Langston Pippins RN, MSN, CDCES Diabetes Coordinator Inpatient Glycemic Control Team Team Pager: [phone] (8a-5p)

## 2024-02-06 NOTE — Progress Notes (Signed)
 OT Cancellation Note  Patient Details Name: OAKLEE MOAK MRN: [id number] DOB: 10-24-1945   Cancelled Treatment:    Reason Eval/Treat Not Completed: Patient not medically ready. OT/PT treatment attempted however pt's RR remains fluctuating/elevating. Will hold OT treatment on this date, will re-attempt when pt is medically ready.   Rosaria Common M.S. OTR/L  02/06/24, 3:20 PM

## 2024-02-06 NOTE — Progress Notes (Signed)
 Speech Language Pathology Treatment: Dysphagia  Patient Details Name: Tyler [name] MRN: [id number] DOB: [birth year] Today's Date: 02/06/2024 Time: 3244-0102 SLP Time Calculation (min) (ACUTE ONLY): 50 min  Assessment / Plan / Recommendation Clinical Impression  Pt seen today for ongoing assessment of swallowing and toleration of oral diet. Pt resting in bed w/ much declined alertness, somnolent. Pt awakened given MOD verbal/tactile cues and half-engaged w/ this SLP giving a few verbal responses but w/ more jargon/confusion. He appeared fatigued and was accepting of only a few po's. No neurological history per chart.  On [place] O2 2L; afebrile. WBC elevated.  Per chart, pt has strep agalactiae Bacteremia and is being tx'd for such. BOTH HR and RR RR remained fluctuating/elevating during rest prior to po's and during session. Pt has a Pacemaker.      Pt appears to present w/ declining function of oropharyngeal phase swallowing in setting of continued illness/infection and hospitalization. Deficits/dysphagia w/ po consistencies requiring a downgrade of diet noted previously (Nectar consistency liquids rec'd in recent tx sessions). Overt clinical s/s of aspiration noted (inconsistently) during po trials w/ consistencies assessed today (Nectar liquids via TSP, purees). No solid foods assessed. Previously, pt had been able to tolerate mech soft foods w/ Nectar liquids when following general aspiration precautions(no straws)-- no overt s/s of aspiration noted.  HOWEVER, currently pt appears more declined both medically and Cognitively w/ significant weakness and deconditioning. He appears at HIGH risk for aspiration w/ oral intake, despite following aspiration precautions and using a modified, Dysphagia diet including Nectar liquids. Pt does have challenging factors that could impact oropharyngeal swallowing to include fatigue/weakness, lengthy illness w/ Multiple Comorbidities, CardioPulmonary decline, and  hospitalization. These factors can increase risk for aspiration, dysphagia as well as decreased oral intake overall.    During po trials fed to him by SLP (a decline in function since BSE), pt exhibited inconsistent throat clearing post swallows- unsure if related to pharyngeal residue(?). Coughing (delayed) occurred x1 post trial. No overt changes in his Baseline CardioPulmonary presentation per monitor. No overt decline in vocal quality nor sustained change in respiratory presentation during/post all po trials given. Oral phase w/ all trials appeared slower w/ increased time needed for bolus management and oral clearing. Noted slight coating of bolus residue w/ the purees. Time and alternating po's to aid to clearing. Edentulous. Pt required MAX assist w/ feeding-- a decline in function.    Recommend a DOWNGRADE in diet consistency to Dysphagia level 1 (puree) w/ Nectar liquids VIA TSP for bolus control; well-moistened foods. NO STRAWS. Recommend aspiration precautions, reduce distractions and Talking during oral intake. Tray setup and sitting Up support for po intake. Feeding using Small bites/TSP sips Slowly. STOP feeding if increased coughing noted. Pills CRUSHED in Puree for safer, easier swallowing.    Consulted Team during/post session re: concern for pt's decline in swallowing function and his HIGH risk for aspiration at this time. MD agreed. Palliative Care discussing w/ Family.  ST services will f/u for any further needs next 1-2 days. Dietician f/u for support. Precautions posted in room, chart.      HPI HPI: Pt is a 78 y.o. Caucasian male with medical history significant for NSTEMI, HFpEF, wound care for toe amputation, lymphedema, osteoarthritis, osteomyelitis w/ toe amputation, r pleural effusion w/ drain, atrial fibrillation and flutter, coronary artery disease, RA, stage III chronic kidney disease, PVD, dyslipidemia and hypertension, as well as type 2 diabetes mellitus, who presented to  the emergency room with a cancer  of dizziness, lightheadedness and confusion that started around 4 PM today when he came back home from work.  This was associated with chest pain and dyspnea.  He describes his chest pain as dull aching graded 5/10 in severity with no radiation, nausea or vomiting or diaphoresis or palpitations.  His symptoms later resolved spontaneously.   Chest CT imaging: Lungs/Pleura: Small right pleural effusion. Interlobular septal  thickening and hazy ground-glass opacities with a lower lung and  posterior predominance. No focal consolidation, pleural effusion, or  pneumothorax.  Findings suggestive of CHF.      SLP Plan  Consult other service (comment);Continue with current plan of care (monitor status)      Recommendations for follow up therapy are one component of a multi-disciplinary discharge planning process, led by the attending physician.  Recommendations may be updated based on patient status, additional functional criteria and insurance authorization.    Recommendations  Diet recommendations: Dysphagia 1 (puree);Nectar-thick liquid Liquids provided via: Teaspoon;No straw Medication Administration: Crushed with puree Supervision: Staff to assist with self feeding;Full supervision/cueing for compensatory strategies Compensations: Minimize environmental distractions;Slow rate;Small sips/bites;Lingual sweep for clearance of pocketing;Multiple dry swallows after each bite/sip;Follow solids with liquid Postural Changes and/or Swallow Maneuvers: Out of bed for meals;Seated upright 90 degrees;Upright 30-60 min after meal                 (Palliative Care following for GOC/POC; Dietician consulted) Oral care BID;Oral care before and after PO;Staff/trained caregiver to provide oral care (denture care)   Frequent or constant Supervision/Assistance Dysphagia, oropharyngeal phase (R13.12);Dysphagia, pharyngoesophageal phase (R13.14)     Consult other service  (comment);Continue with current plan of care (monitor status)       Darla Edward, MS, CCC-SLP Speech Language Pathologist Rehab Services; [facility] [place] [phone] (ascom) Juleen Sorrels  02/06/2024, 3:51 PM

## 2024-02-06 NOTE — Progress Notes (Signed)
 [facility] CLINIC CARDIOLOGY PROGRESS NOTE       Patient ID: Tyler [name] MRN: [id number] DOB/AGE: 1945/12/25 78 y.o.  Admit date: 01/26/2024 Referring Physician Dr. Amalia Jung Primary Physician Jewell Mose, NP  Primary Cardiologist Dr. Bob Burn Reason for Consultation chest pain  HPI: Tyler [name] is a 78 y.o. male  with a past medical history of chronic HFrEF, persistent atrial fibrillation, mitral insufficiency, CHB s/p PPM 2021, hypertension, chronic kidney disease stage III who presented to the ED on 01/26/2024 for chest pain. Cardiology was consulted for further evaluation.   Interval history: -Patient seen and examined this morning. Mental status same as yesterday. -Cr relatively stable. Poor PO intake.  -BP and HR controlled.  Review of systems complete and found to be negative unless listed above    Past Medical History:  Diagnosis Date   (HFpEF) heart failure with preserved ejection fraction (HCC) 03/01/2020   a.) TTE 03/01/2020: EF 55-60%, mod MAC, mod AoV sclerosis, triv AR, mild TR, mod MR, RVSP 50-59; b.) TTE 09/10/2020: EF 55-60%, mod MAC, mod AoV sclerosis, mild TR, 3+ MR, RVSP 50-59; c.) TTE 09/26/2020: EF 55-60%, mild LA dil, triv PR, mild MR/TR, RVSP 37-49   Adenoma of left adrenal gland    Anemia    Arthritis    Atrial fibrillation and flutter (HCC)    a.) CHA2DS2VASc = 5 (age x2, HFpEF, HTN, T2DM);  b.) s/p CTI ablation 09/07/2020; c.) rate/rhythm maintained on oral carvedilol ; not on chronic anticoagulation therapy   CAD (coronary artery disease)    Cardiomegaly    CKD (chronic kidney disease), stage III (HCC)    DOE (dyspnea on exertion)    Drug-induced bradycardia    Gangrene of toe of left foot (HCC)    a.) 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   H/O active rheumatic fever 08/22/2020   Hepatosplenomegaly    History of bilateral cataract extraction    HLD (hyperlipidemia)    Hypertension    Long term current use of aspirin     Lymphedema of  both lower extremities    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)    a.) s/p amputation 11/04/2022   Peripheral vascular disease (HCC)    Pleural effusion on right 09/09/2020   a.) s/p RIGHT thoracentesis with 2180 cc yield   Pneumonia    Presence of permanent cardiac pacemaker 09/10/2020   a.) TVP placement 09/10/2020 due to intermittent CHB in setting of urosepsis; b.) s/p PPM placement 09/15/2020: MDT Azure XT DR (SN: [id number])   Pulmonary hypertension (HCC) 03/01/2020   a.) TTE: 03/01/2020: RVSP 50-59; b.) TTE 09/10/2020: RVSP 50-59; c.) TTE 09/26/2020: RVSP 37-49   RA (rheumatoid arthritis) (HCC)    Rheumatic fever    Sepsis (HCC) 09/10/2020   a.) urosepsis --> BC x 2 sets and UC all grew out significant Proteus mirabilis; admitted to [facility] 09/07/2020 - 09/27/2020.   Sick sinus syndrome [facility])    a.) s/p MDT PPM placement 09/15/2020   T2DM (type 2 diabetes mellitus) (HCC)    Urinary retention    chronic, with indwelling Foley catheter and plans for a suprapubic   Wears dentures    full upper    Past Surgical History:  Procedure Laterality Date   AMPUTATION TOE Right 11/04/2022   Procedure: AMPUTATION TOE;  Surgeon: Dot Gazella, [name];  [name];  Service: Podiatry;  Laterality: Right;   AMPUTATION TOE Right 09/15/2023   Procedure: AMPUTATION TOE;  Surgeon: Alvah Auerbach,  Ivette Marks, [name];  [name];  Service: Orthopedics/Podiatry;  Laterality: Right;  Right hallux amputation   BONE BIOPSY Right 04/05/2023   Procedure: BONE BIOPSY THIRD & FOURTH;  Surgeon: Dot Gazella, [name];  [name];  Service: Podiatry;  Laterality: Right;   CARDIAC ELECTROPHYSIOLOGY STUDY AND ABLATION N/A 09/07/2020   Procedure: CARDIAC EP STUDY AND ABLATION (CTI)   CATARACT EXTRACTION W/PHACO Right 01/16/2017   Procedure: CATARACT EXTRACTION PHACO AND INTRAOCULAR LENS PLACEMENT (IOC)  Right Complicated;  Surgeon: Annell Kidney, MD;  Location: [facility] SURGERY  CNTR;  Service: Ophthalmology;  Laterality: Right;  IVA Block Healon 5 malyugin vision blue Diabetic - oral meds   CATARACT EXTRACTION W/PHACO Left 10/23/2022   Procedure: CATARACT EXTRACTION PHACO AND INTRAOCULAR LENS PLACEMENT (IOC) LEFT DIABETIC;  Surgeon: Clair Crews, MD;  Location: [facility] SURGERY CNTR;  Service: Ophthalmology;  Laterality: Left;  Diabetic   COLONOSCOPY     INCISION AND DRAINAGE OF WOUND Left 09/15/2023   Procedure: IRRIGATION AND DEBRIDEMENT WOUND;  Surgeon: Jennefer Moats, [name];  [name];  Service: Orthopedics/Podiatry;  Laterality: Left;  Left foot wound debridement, possible graft/biopsy   LOWER EXTREMITY ANGIOGRAPHY Right 11/02/2022   Procedure: Lower Extremity Angiography;  Surgeon: Celso College, MD;  Location: [facility] INVASIVE CV LAB;  Service: Cardiovascular;  Laterality: Right;   LOWER EXTREMITY ANGIOGRAPHY Right 09/13/2023   Procedure: Lower Extremity Angiography;  Surgeon: Jackquelyn Mass, MD;  Location: [facility] INVASIVE CV LAB;  Service: Cardiovascular;  Laterality: Right;   METATARSAL HEAD EXCISION Left 07/05/2018   Procedure: RESECTION FIRST METATARSAL INFECTED BONE AND SOFT TISSUE;  Surgeon: Sharlyn Deaner, [name];  [name];  Service: Podiatry;  Laterality: Left;   METATARSAL HEAD EXCISION Right 04/05/2023   Procedure: METATARSAL HEAD EXCISION THIRD & FOURTH;  Surgeon: Dot Gazella, [name];  [name];  Service: Podiatry;  Laterality: Right;   PACEMAKER INSERTION  09/15/2020   TEE WITHOUT CARDIOVERSION N/A 02/04/2024   Procedure: ECHOCARDIOGRAM, TRANSESOPHAGEAL;  Surgeon: Isabell Manzanilla, DO;  [name];  Service: Cardiovascular;  Laterality: N/A;   TOE AMPUTATION Left    TONSILLECTOMY      Medications Prior to Admission  Medication Sig Dispense Refill Last Dose/Taking   acetaminophen  (TYLENOL ) 500 MG tablet Take 2 tablets (1,000 mg total) by mouth 3 (three) times daily as needed (for pain).   Taking As Needed    aspirin  EC 81 MG tablet Take 1 tablet (81 mg total) by mouth daily. Swallow whole. 30 tablet 0 01/25/2024   atorvastatin  (LIPITOR ) 10 MG tablet Take 1 tablet (10 mg total) by mouth daily at 6 PM. 30 tablet 11 Taking   bumetanide  (BUMEX ) 2 MG tablet Take 1 tablet (2 mg total) by mouth daily. 90 tablet 3 Past Week   clopidogrel  (PLAVIX ) 75 MG tablet Take 1 tablet (75 mg total) by mouth daily with breakfast. 30 tablet 11 01/25/2024 Morning   Finerenone  (KERENDIA ) 10 MG TABS Take 1 tablet (10 mg total) by mouth daily. 30 tablet 5 Past Week   furosemide  (LASIX ) 40 MG tablet Take 40 mg by mouth 2 (two) times daily.   01/25/2024   gentamicin  cream (GARAMYCIN ) 0.1 % Apply 1 Application topically 2 (two) times daily. 30 g 1 Taking   glipiZIDE  (GLUCOTROL ) 10 MG tablet Take 1 tablet (10 mg total) by mouth 2 (two) times daily. 60 tablet 2 Past Month   losartan  (COZAAR ) 25 MG tablet Take 25 mg by mouth daily.   Taking   metolazone  (  ZAROXOLYN ) 2.5 MG tablet Take 1 tablet (2.5 mg total) by mouth 2 (two) times a week. On Mondays & Fridays   Past Month   Multiple Vitamins-Minerals (MULTIVITAMIN WITH MINERALS) tablet Take 1 tablet by mouth daily.   01/25/2024   silver  sulfADIAZINE  (SILVADENE ) 1 % cream Apply to affected area daily 50 g 1 Taking   doxycycline  (VIBRA -TABS) 100 MG tablet Take 1 tablet (100 mg total) by mouth 2 (two) times daily. (Patient not taking: Reported on 01/26/2024) 20 tablet 0 Not Taking   metFORMIN  (GLUCOPHAGE ) 1000 MG tablet Take 1 tablet (1,000 mg total) by mouth 2 (two) times daily. (Patient not taking: Reported on 01/26/2024) 60 tablet 2 Not Taking   Social History   Socioeconomic History   Marital status: Single    Spouse name: Not on file   Number of children: Not on file   Years of education: Not on file   Highest education level: Not on file  Occupational History    Comment: [employer]  Tobacco Use   Smoking status: Never   Smokeless tobacco: Never  Vaping Use   Vaping status:  Never Used  Substance and Sexual Activity   Alcohol use: Not Currently    Comment: rarely   Drug use: Never   Sexual activity: Not Currently    Birth control/protection: None  Other Topics Concern   Not on file  Social History Narrative   Lives with roommate   Social Drivers of Health   Financial Resource Strain: Low Risk  (03/20/2023)   Received from [facility]   Overall Financial Resource Strain (CARDIA)    Difficulty of Paying Living Expenses: Not hard at all  Food Insecurity: No Food Insecurity (01/27/2024)   Hunger Vital Sign    Worried About Running Out of Food in the Last Year: Never true    Ran Out of Food in the Last Year: Never true  Transportation Needs: No Transportation Needs (01/27/2024)   PRAPARE - Administrator, Civil Service (Medical): No    Lack of Transportation (Non-Medical): No  Physical Activity: Not on file  Stress: No Stress Concern Present (09/07/2020)   Received from [facility], [facility] - Occupational Stress Questionnaire    Feeling of Stress : Not at all  Social Connections: Socially Isolated (01/27/2024)   Social Connection and Isolation Panel [NHANES]    Frequency of Communication with Friends and Family: More than three times a week    Frequency of Social Gatherings with Friends and Family: Twice a week    Attends Religious Services: Never    Database administrator or Organizations: No    Attends Banker Meetings: Never    Marital Status: Divorced  Catering manager Violence: Not At Risk (01/27/2024)   Humiliation, Afraid, Rape, and Kick questionnaire    Fear of Current or Ex-Partner: No    Emotionally Abused: No    Physically Abused: No    Sexually Abused: No    Family History  Problem Relation Age of Onset   Cancer Niece      Vitals:   02/05/24 1052 02/05/24 1533 02/05/24 2120 02/06/24 0459  BP: (!) 102/57 110/67    Pulse: 100 93    Resp: 17 17     Temp: 98.4 F (36.9 C) 98.3 F (36.8 C) 98 F (36.7 C)   TempSrc:      SpO2: 100% 96%    Weight:  102.4 kg  Height:        PHYSICAL EXAM General: chronically ill appearing elderly male, well nourished, in no acute distress. HEENT: Normocephalic and atraumatic. Neck: No JVD.  Lungs: Normal respiratory effort on room air.  Heart: HRRR. Normal S1 and S2 without gallops or murmurs.  Abdomen: Non-distended appearing.  Msk: Normal strength and tone for age. Extremities: Warm and well perfused. No clubbing, cyanosis. No edema bilaterally.  Neuro: Oriented X 2. Psych: Altered  Labs: Basic Metabolic Panel: Recent Labs    02/05/24 0233 02/06/24 0419  NA 140 144  K 3.8 3.8  CL 111 111  CO2 21* 22  GLUCOSE 205* 194*  BUN 62* 63*  CREATININE 1.21 1.34*  CALCIUM  8.8* 9.1   Liver Function Tests: No results for input(s): "AST", "ALT", "ALKPHOS", "BILITOT", "PROT", "ALBUMIN" in the last 72 hours.  No results for input(s): "LIPASE", "AMYLASE" in the last 72 hours. CBC: Recent Labs    02/05/24 0233 02/06/24 0419  WBC 10.8* 13.5*  HGB 11.1* 11.8*  HCT 35.4* 38.5*  MCV 85.5 86.5  PLT 273 297   Cardiac Enzymes: No results for input(s): "CKTOTAL", "CKMB", "CKMBINDEX", "TROPONINIHS" in the last 72 hours.  BNP: No results for input(s): "BNP" in the last 72 hours.  D-Dimer: No results for input(s): "DDIMER" in the last 72 hours. Hemoglobin A1C: No results for input(s): "HGBA1C" in the last 72 hours.  Fasting Lipid Panel: No results for input(s): "CHOL", "HDL", "LDLCALC", "TRIG", "CHOLHDL", "LDLDIRECT" in the last 72 hours. Thyroid Function Tests: No results for input(s): "TSH", "T4TOTAL", "T3FREE", "THYROIDAB" in the last 72 hours.  Invalid input(s): "FREET3" Anemia Panel: No results for input(s): "VITAMINB12", "FOLATE", "FERRITIN", "TIBC", "IRON", "RETICCTPCT" in the last 72 hours.   Radiology: [facility] Chest Port 1 View Result Date: 02/05/2024 CLINICAL DATA:  Tachypnea  EXAM: PORTABLE CHEST 1 VIEW COMPARISON:  Jan 26, 2024 FINDINGS: No change in the left subclavian bipolar ICDF pacemaker device tip of the leads in good position no evidence of discontinuity. Mild bilateral interstitial prominence with some infiltrates or atelectasis of the left upper lobe could correlate with congestive changes with perhaps superimposed pneumonia or pneumonic infiltrates without significant consolidation Heart and mediastinum normal No pleural effusions IMPRESSION: Mild bilateral interstitial prominence with some infiltrates or atelectasis of the left upper lobe could correlate with congestive changes with perhaps superimposed pneumonia or pneumonic infiltrates without significant consolidation. Electronically Signed   By: Fredrich Jefferson M.D.   On: 02/05/2024 16:18   ECHO TEE Result Date: 02/05/2024    TRANSESOPHOGEAL ECHO REPORT   Patient Name:   LANNIE MOGUL Date of Exam: 02/04/2024 Medical Rec #:  [id number]      Height:       78.0 in Accession #:    [id number]     Weight:       238.1 lb Date of Birth:  1946-02-02      BSA:          2.431 m Patient Age:    77 years       BP:           118/67 mmHg Patient Gender: M              HR:           94 bpm. Exam Location:  [facility] Procedure: Transesophageal Echo, Color Doppler and Cardiac Doppler (Both            Spectral and Color Flow Doppler were utilized during procedure).  Indications:     Not listed on TEE check-in sheet  History:         Patient has prior history of Echocardiogram examinations, most                  recent 01/29/2024. Pacemaker; Risk Factors:Hypertension and                  Dyslipidemia.  Sonographer:     Broadus Canes Referring Phys:  [id number] Lanett Lasorsa Diagnosing Phys: Lanell Pinta Custovic PROCEDURE: The transesophogeal probe was passed without difficulty through the esophogus of the patient. Sedation performed by different physician. [name] vital signs; including heart rate, blood pressure, and oxygen saturation; remained stable  throughout the procedure. The patient developed no complications during the procedure.  IMPRESSIONS  1. Left ventricular ejection fraction, by estimation, is 25 to 30%. The left ventricle has severely decreased function. The left ventricle demonstrates global hypokinesis. Left ventricular diastolic function could not be evaluated.  2. Right ventricular systolic function is normal. The right ventricular size is normal.  3. No left atrial/left atrial appendage thrombus was detected.  4. The mitral valve is normal in structure. Moderate to severe mitral valve regurgitation. No evidence of mitral stenosis.  5. The aortic valve is normal in structure. Aortic valve regurgitation is not visualized. Mild aortic valve stenosis.  6. The inferior vena cava is normal in size with greater than 50% respiratory variability, suggesting right atrial pressure of 3 mmHg. Conclusion(s)/Recommendation(s): No evidence of vegetation/infective endocarditis on this transesophageael echocardiogram. FINDINGS  Left Ventricle: Left ventricular ejection fraction, by estimation, is 25 to 30%. The left ventricle has severely decreased function. The left ventricle demonstrates global hypokinesis. The left ventricular internal cavity size was normal in size. There is no left ventricular hypertrophy. Left ventricular diastolic function could not be evaluated. Right Ventricle: The right ventricular size is normal. No increase in right ventricular wall thickness. Right ventricular systolic function is normal. Left Atrium: Left atrial size was normal in size. No left atrial/left atrial appendage thrombus was detected. Right Atrium: Right atrial size was normal in size. Pericardium: There is no evidence of pericardial effusion. Mitral Valve: The mitral valve is normal in structure. Moderate to severe mitral valve regurgitation. No evidence of mitral valve stenosis. Tricuspid Valve: The tricuspid valve is normal in structure. Tricuspid valve regurgitation  is mild. Aortic Valve: The aortic valve is normal in structure. Aortic valve regurgitation is not visualized. Mild aortic stenosis is present. Pulmonic Valve: The pulmonic valve was normal in structure. Pulmonic valve regurgitation is not visualized. Aorta: The aortic root is normal in size and structure. Venous: The inferior vena cava is normal in size with greater than 50% respiratory variability, suggesting right atrial pressure of 3 mmHg. IAS/Shunts: No atrial level shunt detected by color flow Doppler. Sabina Custovic Electronically signed by Isabell Manzanilla Signature Date/Time: 02/05/2024/4:10:45 PM    Final    DG Knee 1-2 Views Left Result Date: 01/30/2024 CLINICAL DATA:  Knee effusion. EXAM: LEFT KNEE - 2 VIEW COMPARISON:  None Available. FINDINGS: Osteopenia. Joint space loss mildly of the mediolateral compartment and patellofemoral joint with scattered osteophyte formation. Scattered vascular calcifications are identified. There is some chondrocalcinosis as well of the mediolateral compartments. Subchondral cyst formation along the medial tibial plateau. The lateral view are oblique limiting evaluation. No large effusion but evaluation of a a small effusion is limited. Repeat lateral view could be considered versus additional imaging. IMPRESSION: Degenerative changes. Osteopenia. Chondrocalcinosis. No large effusion  but lateral views are limited due to rotation. Subtle effusion is not excluded. Further workup as clinically appropriate Electronically Signed   By: Adrianna Horde M.D.   On: 01/30/2024 16:58   US  EKG SITE RITE Result Date: 01/29/2024 If Site Rite image not attached, placement could not be confirmed due to current cardiac rhythm.  ECHOCARDIOGRAM COMPLETE Result Date: 01/29/2024    ECHOCARDIOGRAM REPORT   Patient Name:   EVYN TORBECK Date of Exam: 01/28/2024 Medical Rec #:  [id number]      Height:       78.0 in Accession #:    [id number]     Weight:       260.8 lb Date of Birth:  [birth year]       BSA:          2.527 m Patient Age:    77 years       BP:           103/75 mmHg Patient Gender: M              HR:           96 bpm. Exam Location:  [facility] Procedure: 2D Echo, Cardiac Doppler and Color Doppler (Both Spectral and Color            Flow Doppler were utilized during procedure). Indications:     NSTEMI I21.4  History:         Patient has no prior history of Echocardiogram examinations,                  most recent 07/04/2023. CHF and Cardiomyopathy, Previous                  Myocardial Infarction, Pacemaker, PAD and CKD, stage 3,                  Arrythmias:Bradycardia, Atrial Fibrillation and Atrial Flutter,                  Signs/Symptoms:Dyspnea and Syncope; Risk Factors:Diabetes,                  Hypertension and Dyslipidemia.  Sonographer:     Terrilee Few RCS Referring Phys:  [id number] Rain Wilhide Diagnosing Phys: Lida Reeks Alluri IMPRESSIONS  1. Left ventricular ejection fraction, by estimation, is 25 to 30%. The left ventricle has severely decreased function. The left ventricle demonstrates global hypokinesis. There is mild left ventricular hypertrophy. Left ventricular diastolic parameters  are indeterminate.  2. Right ventricular systolic function is moderately reduced. The right ventricular size is mildly enlarged. There is severely elevated pulmonary artery systolic pressure. The estimated right ventricular systolic pressure is 73.9 mmHg.  3. Left atrial size was mildly dilated.  4. Right atrial size was moderately dilated.  5. Eccentric mitral regurgitation which can be underestimated on this study. Recommend TEE for further evaluation. Moderate to severe mitral valve regurgitation. Mild to moderate mitral stenosis. Severe mitral annular calcification.  6. Sclerotic aortic valve with low flow low gradient aortic stenosis, probably mild to moderate.. The aortic valve is tricuspid.  7. Aortic dilatation noted. There is mild dilatation of the aortic root, measuring 44 mm. FINDINGS  Left  Ventricle: Left ventricular ejection fraction, by estimation, is 25 to 30%. The left ventricle has severely decreased function. The left ventricle demonstrates global hypokinesis. There is mild left ventricular hypertrophy. Left ventricular diastolic parameters are indeterminate. Right Ventricle: The right ventricular size is mildly enlarged. No increase in right ventricular wall  thickness. Right ventricular systolic function is moderately reduced. There is severely elevated pulmonary artery systolic pressure. The tricuspid regurgitant velocity is 4.06 m/s, and with an assumed right atrial pressure of 8 mmHg, the estimated right ventricular systolic pressure is 73.9 mmHg. Left Atrium: Left atrial size was mildly dilated. Right Atrium: Right atrial size was moderately dilated. Pericardium: There is no evidence of pericardial effusion. Mitral Valve: Eccentric mitral regurgitation which can be underestimated on this study. Recommend TEE for further evaluation. There is mild thickening of the mitral valve leaflet(s). There is mild calcification of the mitral valve leaflet(s). Severe mitral annular calcification. Moderate to severe mitral valve regurgitation, with anteriorly-directed jet. Mild to moderate mitral valve stenosis. MV peak gradient, 14.7 mmHg. The mean mitral valve gradient is 8.4 mmHg. Tricuspid Valve: The tricuspid valve is normal in structure. Tricuspid valve regurgitation is mild. Aortic Valve: Sclerotic aortic valve with low flow low gradient aortic stenosis, probably mild to moderate. The aortic valve is tricuspid. Aortic valve mean gradient measures 10.0 mmHg. Aortic valve peak gradient measures 16.4 mmHg. Aortic valve area, by  VTI measures 1.43 cm. Pulmonic Valve: The pulmonic valve was not well visualized. Pulmonic valve regurgitation is trivial. Aorta: Aortic dilatation noted. There is mild dilatation of the aortic root, measuring 44 mm. IAS/Shunts: The interatrial septum was not well visualized.   LEFT VENTRICLE PLAX 2D LVIDd:         5.50 cm   Diastology LVIDs:         4.50 cm   LV e' medial:    5.87 cm/s LV PW:         1.60 cm   LV E/e' medial:  22.5 LV IVS:        1.50 cm   LV e' lateral:   10.80 cm/s LVOT diam:     2.30 cm   LV E/e' lateral: 12.2 LV SV:         53 LV SV Index:   21 LVOT Area:     4.15 cm  RIGHT VENTRICLE            IVC RV S prime:     8.59 cm/s  IVC diam: 3.00 cm TAPSE (M-mode): 1.6 cm LEFT ATRIUM              Index        RIGHT ATRIUM           Index LA diam:        5.90 cm  2.33 cm/m   RA Area:     28.30 cm LA Vol (A2C):   114.0 ml 45.11 ml/m  RA Volume:   112.00 ml 44.32 ml/m LA Vol (A4C):   79.7 ml  31.54 ml/m LA Biplane Vol: 95.1 ml  37.63 ml/m  AORTIC VALVE AV Area (Vmax):    1.77 cm AV Area (Vmean):   1.56 cm AV Area (VTI):     1.43 cm AV Vmax:           202.50 cm/s AV Vmean:          144.500 cm/s AV VTI:            0.375 m AV Peak Grad:      16.4 mmHg AV Mean Grad:      10.0 mmHg LVOT Vmax:         86.13 cm/s LVOT Vmean:        54.333 cm/s LVOT VTI:          0.129  m LVOT/AV VTI ratio: 0.34  AORTA Ao Asc diam: 3.80 cm MITRAL VALVE                TRICUSPID VALVE MV Area (PHT): 2.66 cm     TR Peak grad:   65.9 mmHg MV Area VTI:   1.36 cm     TR Vmax:        406.00 cm/s MV Peak grad:  14.7 mmHg MV Mean grad:  8.4 mmHg     SHUNTS MV Vmax:       1.91 m/s     Systemic VTI:  0.13 m MV Vmean:      134.8 cm/s   Systemic Diam: 2.30 cm MV Decel Time: 285 msec MV E velocity: 132.00 cm/s MV A velocity: 98.50 cm/s MV E/A ratio:  1.34 Joetta Mustache Electronically signed by Joetta Mustache Signature Date/Time: 01/29/2024/1:23:56 PM    Final    CT Angio Chest Pulmonary Embolism (PE) W or WO Contrast Result Date: 01/27/2024 CLINICAL DATA:  Chest pain.  PE suspected EXAM: CT ANGIOGRAPHY CHEST WITH CONTRAST TECHNIQUE: Multidetector CT imaging of the chest was performed using the standard protocol during bolus administration of intravenous contrast. Multiplanar CT image reconstructions and  MIPs were obtained to evaluate the vascular anatomy. RADIATION DOSE REDUCTION: This exam was performed according to the departmental dose-optimization program which includes automated exposure control, adjustment of the mA and/or kV according to patient size and/or use of iterative reconstruction technique. CONTRAST:  75mL OMNIPAQUE  IOHEXOL  350 MG/ML SOLN COMPARISON:  Same day chest radiograph FINDINGS: Cardiovascular: Cardiomegaly. Mitral annular calcification. Coronary artery and aortic atherosclerotic calcification. Mild dilation of the ascending aorta measuring 40 mm in diameter. No pericardial effusion. Negative for acute pulmonary embolism. Left chest wall pacemaker. Mediastinum/Nodes: Trachea and esophagus are unremarkable. Shotty mediastinal lymph nodes are likely reactive. Lungs/Pleura: Small right pleural effusion. Interlobular septal thickening and hazy ground-glass opacities with a lower lung and posterior predominance. No focal consolidation, pleural effusion, or pneumothorax. Upper Abdomen: No acute abnormality. Musculoskeletal: No acute fracture. Review of the MIP images confirms the above findings. IMPRESSION: 1. Negative for acute pulmonary embolism. 2. Findings suggestive of CHF with mild pulmonary edema. Small right pleural effusion. 3. 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 Recommend annual imaging followup by CTA or MRA. This recommendation follows 2010 ACCF/AHA/AATS/ACR/ASA/SCA/SCAI/SIR/STS/SVM Guidelines for the Diagnosis and Management of Patients with Thoracic Aortic Disease. Circulation. 2010; 121: Z610-R604. Aortic aneurysm NOS (ICD10-I71.9) 4. Aortic Atherosclerosis (ICD10-I70.0). Electronically Signed   By: Rozell Cornet M.D.   On: 01/27/2024 22:11   CT Head Wo Contrast Result Date: 01/26/2024 CLINICAL DATA:  Mental status change. EXAM: CT HEAD WITHOUT CONTRAST TECHNIQUE: Contiguous axial images were obtained from the base of the skull through the vertex without intravenous  contrast. RADIATION DOSE REDUCTION: This exam was performed according to the departmental dose-optimization program which includes automated exposure control, adjustment of the mA and/or kV according to patient size and/or use of iterative reconstruction technique. COMPARISON:  Head CT 07/04/2018. FINDINGS: Brain: No evidence of acute infarction, hemorrhage, hydrocephalus, extra-axial collection or mass lesion/mass effect. There is mild diffuse atrophy and mild periventricular white matter hypodensity which is similar to the prior study. Vascular: Atherosclerotic calcifications are present within the cavernous internal carotid arteries. Skull: Normal. Negative for fracture or focal lesion. Sinuses/Orbits: No acute finding. Other: None. IMPRESSION: 1. No acute intracranial process. 2. Mild diffuse atrophy and mild chronic small vessel ischemic changes. Electronically Signed   By: Rollen Clines.D.  On: 01/26/2024 20:18   DG Chest Portable 1 View Result Date: 01/26/2024 CLINICAL DATA:  Chest pain EXAM: PORTABLE CHEST 1 VIEW COMPARISON:  09/11/2023 FINDINGS: Left-sided pacing device similar in position. Cardiomegaly with mild central congestion. No consolidation, pleural effusion or pneumothorax. IMPRESSION: Cardiomegaly with mild central congestion. Electronically Signed   By: Esmeralda Hedge M.D.   On: 01/26/2024 20:14    ECHO as above  TELEMETRY reviewed by me 02/06/2024: ventricular pacing rate 90s  EKG reviewed by me: atrial fibrillation, rate 112 bpm PVCs.    Data reviewed by me 02/06/2024: last 24h vitals tele labs imaging I/O's hospitalist progress note, rapid response notes, ID notes, advanced heart failure notes, EP notes, ortho notes  Principal Problem:   NSTEMI (non-ST elevated myocardial infarction) (HCC) Active Problems:   Sepsis due to cellulitis (HCC)   Acute on chronic systolic CHF (congestive heart failure) (HCC)   Type II diabetes mellitus with renal manifestations (HCC)    Dyslipidemia   Essential hypertension   Acute kidney injury superimposed on stage 3a chronic kidney disease (HCC) - baseline scr 1.3   Pressure injury of skin   Septicemia due to group B Streptococcus (HCC)   Chronic respiratory failure with hypoxia (HCC)   Effusion of left knee   Pseudogout of left knee   Bacteremia due to group B Streptococcus   Diabetic ulcer of right foot associated with type 2 diabetes mellitus, with fat layer exposed (HCC)    ASSESSMENT AND PLAN:  Rivers Hildebran Lenhoff is a 77 y.o. male  with a past medical history of chronic HFrEF, persistent atrial fibrillation, mitral insufficiency, CHB s/p PPM 2021, hypertension, chronic kidney disease stage III who presented to the ED on 01/26/2024 for chest pain. Cardiology was consulted for further evaluation.   # Severe sepsis # Strep agalactiae bacteremia Rapid response called overnight 5/6 for acute decompensated respiratory status, tachycardia, fever.  Meets sepsis criteria. Blood cultures growing strep agalactiae with high bioburden.  No clear evidence of any vegetation on TTE. TEE without evidence of endocarditis or lead vegetation.  -ID following, appreciate recommendations.   # NSTEMI, type I vs type II # Acute on chronic HFrEF Patient reports to ED due to worsening SOB. Patient was seen outpatient cardiology on 09/2023 and there was plan for ischemic eval with PE stress test, however patient did not perform this exam or follow-up since this visit. Patient appears fluid overload on exam. Troponins elevated and trending 69 > 105 > 326 > 714 > 1300 > 1800. EKG in ED with AF PVCs with no acute ischemic changes, rate 112 bpm. Cr is elevated at 1.56 (baseline around 1.35). BP is stable. BNP elevated at 4043.  Rapid response called overnight 5/6 for acute decompensated respiratory status, tachycardia, fever.  Meets sepsis criteria.  Echo this admission with EF 25-30%, global hypokinesis, moderate RV dysfunction, severely elevated PASP,  eccentric moderate to severe MR, low-flow low gradient mild to moderate aortic stenosis. -Advanced heart failure signed off 5/13.  Appreciate recommendations. -Decrease bumex  to 1 mg daily. Net negative 14L since admission.  -Continue losartan  12.5 mg daily. Continue metoprolol  succinate 25 mg daily. Defer addition of SGLT2i as he currently has a foley.  -Continue ASA 81 mg, atorvastatin  80 mg daily. -Patient will eventually need ischemic evaluation, not candidate at this time. Consider outpatient.   # CHB s/p [employer]onic dual chamber PPM 08/2020 # Hx atrial fibrillation Patient with history of persistent atrial fibrillation previously on eliquis , this was discontinued due to  hematuria in 2023. Underwent PPM implant in 2021 for CHB, has had regular device checks since implant. EKG in ED and per tele this AM AF rates in 100s.  -Beta-blocker as above.  -Recommend anticoagulation however patient has historically deferred given prior bleeding issues. -EP following. TEE without lead vegetation so no plan for extraction.   # Chronic kidney disease stage IIIa Patient with hx of CKD, baseline Cr 1.3-1.4. Cr this AM 1.34. BUN 63. -Continue to monitor renal function closely during diuresis. -Management per primary.   Patient with multiple co-morbidities, declining mental and functional status. Prognosis is guarded.    This patient's plan of care was discussed and created with Dr. Custovic and she is in agreement.  Signed: Hamp Levine, PA-C  02/06/2024, 9:02 AM [facility]

## 2024-02-06 NOTE — Plan of Care (Signed)
  Problem: Fluid Volume: Goal: Ability to maintain a balanced intake and output will improve Outcome: Progressing   Problem: Clinical Measurements: Goal: Ability to maintain clinical measurements within normal limits will improve Outcome: Progressing   Problem: Clinical Measurements: Goal: Respiratory complications will improve Outcome: Progressing   Problem: Respiratory: Goal: Ability to maintain adequate ventilation will improve Outcome: Progressing

## 2024-02-07 ENCOUNTER — Inpatient Hospital Stay

## 2024-02-07 DIAGNOSIS — I5023 Acute on chronic systolic (congestive) heart failure: Secondary | ICD-10-CM | POA: Diagnosis not present

## 2024-02-07 DIAGNOSIS — G9340 Encephalopathy, unspecified: Secondary | ICD-10-CM | POA: Diagnosis not present

## 2024-02-07 DIAGNOSIS — E11621 Type 2 diabetes mellitus with foot ulcer: Secondary | ICD-10-CM | POA: Diagnosis not present

## 2024-02-07 DIAGNOSIS — Z515 Encounter for palliative care: Secondary | ICD-10-CM | POA: Diagnosis not present

## 2024-02-07 DIAGNOSIS — R7881 Bacteremia: Secondary | ICD-10-CM | POA: Diagnosis not present

## 2024-02-07 DIAGNOSIS — I214 Non-ST elevation (NSTEMI) myocardial infarction: Secondary | ICD-10-CM | POA: Diagnosis not present

## 2024-02-07 DIAGNOSIS — R0689 Other abnormalities of breathing: Secondary | ICD-10-CM

## 2024-02-07 DIAGNOSIS — J9611 Chronic respiratory failure with hypoxia: Secondary | ICD-10-CM | POA: Diagnosis not present

## 2024-02-07 DIAGNOSIS — B951 Streptococcus, group B, as the cause of diseases classified elsewhere: Secondary | ICD-10-CM | POA: Diagnosis not present

## 2024-02-07 LAB — CBC WITH DIFFERENTIAL/PLATELET
Abs Immature Granulocytes: 0.24 10*3/uL — ABNORMAL HIGH (ref 0.00–0.07)
Basophils Absolute: 0.1 10*3/uL (ref 0.0–0.1)
Basophils Relative: 0 %
Eosinophils Absolute: 0 10*3/uL (ref 0.0–0.5)
Eosinophils Relative: 0 %
HCT: 40.8 % (ref 39.0–52.0)
Hemoglobin: 12.2 g/dL — ABNORMAL LOW (ref 13.0–17.0)
Immature Granulocytes: 1 %
Lymphocytes Relative: 5 %
Lymphs Abs: 1 10*3/uL (ref 0.7–4.0)
MCH: 26.2 pg (ref 26.0–34.0)
MCHC: 29.9 g/dL — ABNORMAL LOW (ref 30.0–36.0)
MCV: 87.6 fL (ref 80.0–100.0)
Monocytes Absolute: 1 10*3/uL (ref 0.1–1.0)
Monocytes Relative: 5 %
Neutro Abs: 16.6 10*3/uL — ABNORMAL HIGH (ref 1.7–7.7)
Neutrophils Relative %: 89 %
Platelets: 352 10*3/uL (ref 150–400)
RBC: 4.66 MIL/uL (ref 4.22–5.81)
RDW: 16.5 % — ABNORMAL HIGH (ref 11.5–15.5)
WBC: 18.9 10*3/uL — ABNORMAL HIGH (ref 4.0–10.5)
nRBC: 0 % (ref 0.0–0.2)

## 2024-02-07 LAB — VITAMIN B12: Vitamin B-12: 1934 pg/mL — ABNORMAL HIGH (ref 180–914)

## 2024-02-07 LAB — BLOOD GAS, ARTERIAL
Acid-base deficit: 5.1 mmol/L — ABNORMAL HIGH (ref 0.0–2.0)
Bicarbonate: 25.2 mmol/L (ref 20.0–28.0)
Delivery systems: POSITIVE
Expiratory PAP: 5 cmH2O
FIO2: 100 %
Inspiratory PAP: 13 cmH2O
O2 Saturation: 99.3 %
Patient temperature: 37
pCO2 arterial: 69 mmHg (ref 32–48)
pH, Arterial: 7.17 — CL (ref 7.35–7.45)
pO2, Arterial: 175 mmHg — ABNORMAL HIGH (ref 83–108)

## 2024-02-07 LAB — COMPREHENSIVE METABOLIC PANEL WITH GFR
ALT: 24 U/L (ref 0–44)
AST: 20 U/L (ref 15–41)
Albumin: 2.7 g/dL — ABNORMAL LOW (ref 3.5–5.0)
Alkaline Phosphatase: 48 U/L (ref 38–126)
Anion gap: 9 (ref 5–15)
BUN: 84 mg/dL — ABNORMAL HIGH (ref 8–23)
CO2: 22 mmol/L (ref 22–32)
Calcium: 9.4 mg/dL (ref 8.9–10.3)
Chloride: 121 mmol/L — ABNORMAL HIGH (ref 98–111)
Creatinine, Ser: 1.65 mg/dL — ABNORMAL HIGH (ref 0.61–1.24)
GFR, Estimated: 42 mL/min — ABNORMAL LOW (ref >60)
Glucose, Bld: 226 mg/dL — ABNORMAL HIGH (ref 70–99)
Potassium: 4.7 mmol/L (ref 3.5–5.1)
Sodium: 145 mmol/L (ref 135–145)
Total Bilirubin: 1.1 mg/dL (ref 0.0–1.2)
Total Protein: 7.5 g/dL (ref 6.5–8.1)

## 2024-02-07 LAB — GLUCOSE, CAPILLARY
Glucose-Capillary: 248 mg/dL — ABNORMAL HIGH (ref 70–99)
Glucose-Capillary: 231 mg/dL — ABNORMAL HIGH (ref 70–99)

## 2024-02-07 LAB — PHOSPHORUS: Phosphorus: 5.3 mg/dL — ABNORMAL HIGH (ref 2.5–4.6)

## 2024-02-07 LAB — RPR: RPR Ser Ql: NONREACTIVE

## 2024-02-07 LAB — MAGNESIUM: Magnesium: 2.6 mg/dL — ABNORMAL HIGH (ref 1.7–2.4)

## 2024-02-07 MED ORDER — LORAZEPAM 2 MG/ML IJ SOLN: 1.0000 mg | INTRAMUSCULAR | Status: DC | PRN

## 2024-02-07 MED ORDER — GLYCOPYRROLATE 1 MG PO TABS: 1.0000 mg | ORAL_TABLET | ORAL | Status: DC | PRN

## 2024-02-07 MED ORDER — POLYVINYL ALCOHOL 1.4 % OP SOLN: 1.0000 [drp] | Freq: Four times a day (QID) | OPHTHALMIC | Status: DC | PRN

## 2024-02-07 MED ORDER — FUROSEMIDE 10 MG/ML IJ SOLN
40.0000 mg | Freq: Once | INTRAMUSCULAR | Status: AC
  Administered 2024-02-07: 40 mg via INTRAVENOUS
  Filled 2024-02-07: qty 4

## 2024-02-07 MED ORDER — HALOPERIDOL LACTATE 5 MG/ML IJ SOLN: 0.5000 mg | INTRAMUSCULAR | Status: DC | PRN

## 2024-02-07 MED ORDER — GLYCOPYRROLATE 0.2 MG/ML IJ SOLN: 0.2000 mg | INTRAMUSCULAR | Status: DC | PRN

## 2024-02-07 MED ORDER — LORAZEPAM 2 MG/ML PO CONC: 1.0000 mg | ORAL | Status: DC | PRN

## 2024-02-07 MED ORDER — INSULIN GLARGINE-YFGN 100 UNIT/ML ~~LOC~~ SOLN
10.0000 [IU] | Freq: Every day | SUBCUTANEOUS | Status: DC
  Filled 2024-02-07: qty 0.1

## 2024-02-07 MED ORDER — HALOPERIDOL LACTATE 2 MG/ML PO CONC: 0.5000 mg | ORAL | Status: DC | PRN

## 2024-02-07 MED ORDER — BIOTENE DRY MOUTH MT LIQD: 15.0000 mL | OROMUCOSAL | Status: DC | PRN

## 2024-02-07 MED ORDER — HALOPERIDOL 0.5 MG PO TABS: 0.5000 mg | ORAL_TABLET | ORAL | Status: DC | PRN

## 2024-02-07 MED ORDER — LORAZEPAM 1 MG PO TABS: 1.0000 mg | ORAL_TABLET | ORAL | Status: DC | PRN

## 2024-02-07 MED ORDER — OXYCODONE HCL 5 MG PO TABS: 5.0000 mg | ORAL_TABLET | ORAL | Status: DC | PRN

## 2024-02-07 MED ORDER — PIPERACILLIN-TAZOBACTAM 3.375 G IVPB
3.3750 g | Freq: Three times a day (TID) | INTRAVENOUS | Status: DC
  Administered 2024-02-07: 3.375 g via INTRAVENOUS

## 2024-02-07 MED ORDER — MORPHINE SULFATE (PF) 2 MG/ML IV SOLN
1.0000 mg | INTRAVENOUS | Status: DC | PRN
  Administered 2024-02-07: 2 mg via INTRAVENOUS
  Filled 2024-02-07 (×2): qty 1

## 2024-02-10 LAB — VITAMIN B1: Vitamin B1 (Thiamine): 183.8 nmol/L (ref 66.5–200.0)

## 2024-02-12 LAB — CULTURE, BLOOD (ROUTINE X 2)
Culture: NO GROWTH
Culture: NO GROWTH
Special Requests: ADEQUATE

## 2024-02-23 NOTE — Progress Notes (Signed)
 Nutrition Brief Note  Chart reviewed. Pt now transitioning to comfort care.  No further nutrition interventions planned at this time.  Please re-consult as needed.   Levada Schilling, RD, LDN, CDCES Registered Dietitian III Certified Diabetes Care and Education Specialist If unable to reach this RD, please use "RD Inpatient" group chat on secure chat between hours of 8am-4 pm daily

## 2024-02-23 NOTE — Progress Notes (Signed)
 SLP Cancellation Note  Patient Details Name: Tyler [name] MRN: [id number] DOB: August 10, 1946   Cancelled treatment:       Reason Eval/Treat Not Completed:  (chart reviewed)  Pt has transitioned to Comfort Care status at this time per chart notes. ST services will sign off.     Darla Edward, MS, CCC-SLP Speech Language Pathologist Rehab Services; [facility] [phone] (ascom) Tyler [name] 02/10/2024, 3:25 PM

## 2024-02-23 NOTE — Progress Notes (Signed)
 Pt arousable to physical stimuli but closes eyes after making contact with me.

## 2024-02-23 NOTE — Progress Notes (Signed)
 PT Cancellation Note  Patient Details Name: DIOR PALOMAREZ MRN: [id number] DOB: 03-May-1946   Cancelled Treatment:     PT attempt. RN at bedside placing pt on non re-breather due to elevated RR and pt's desaturation to mid too upper 80s at rest. Pt is not alert enough to safety participate at this time. MD to order chest xray. Acute PT will continue efforts to maximize pt's independence and safety with all ADLs.    Koleen Perna 02/21/2024, 10:11 AM

## 2024-02-23 NOTE — Death Summary Note (Signed)
 DEATH SUMMARY   Patient Details  Name: Christophe Rising Delage MRN: [id number] DOB: 1946-08-06 MVH:[id number], Delanna Fears, NP Admission/Discharge Information   Admit Date:  2024/01/31  Date of Death: Date of Death: 02-12-24  Time of Death: Time of Death: 02-20-2109  Length of Stay: Feb 07, 2024   Principle Cause of death: Hypercapnic Respiratory failure  Hospital Diagnoses: Principal Problem:   NSTEMI (non-ST elevated myocardial infarction) [facility]) Active Problems:   Acute kidney injury superimposed on stage 3a chronic kidney disease (HCC) - baseline scr 1.3   Septicemia due to group B Streptococcus (HCC)   Sepsis due to cellulitis (HCC)   Acute on chronic systolic CHF (congestive heart failure) (HCC)   Type II diabetes mellitus with renal manifestations (HCC)   Dyslipidemia   Essential hypertension   Pressure injury of skin   Pseudogout of left knee   Ulcerated, foot, right, with fat layer exposed (HCC)   Ulcerated, foot, left, with fat layer exposed (HCC)   Chronic respiratory failure with hypoxia (HCC)   Effusion of left knee   Bacteremia due to group B Streptococcus   Diabetic ulcer of right foot associated with type 2 diabetes mellitus, with fat layer exposed (HCC)   Malnutrition of moderate degree   Encephalopathy acute   Hypercapnia   Hospital Course: Serigne Kubicek Eberlein was a 78 year old Caucasian male with heart failure reduced EF (25-30%), osteoarthritis, A-fib and flutter, 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 EKG revealed A-fib RVR with PVCs and LBBB.  CT head was negative for any acute intracranial abnormality.  Chest x-ray with cardiomegaly and mild central congestion.  Patient was started on heparin  infusion and  cardiology was consulted.  Subsequently patient had worsening leukocytosis with a nonanion gap metabolic acidosis and elevated BNP over 4000.  Troponin also continued to increase.  Cardiology initiated the patient on IV Bumex  and ordered an echocardiogram.   On 5/6 a rapid response was called and patient became increasingly tachycardic and tachypneic and was found to be febrile at 102.9.  He was also found to have worsening right lower extremity erythema with signs of venous congestion and full-thickness diabetic foot ulcers involving the left foot.  Blood cultures were obtained and patient was initially on broad-spectrum antibiotics.   Blood cultures grew GBS and infectious disease was consulted.  Patient was planned for TEE but it was canceled on 5/9 due to altered mentation.   Patient had TEE on 5/13 which was negative for endocarditis.  Patient remained on heparin  drip until 5/14 at which time cardiology discontinued the medication. Encephalopathy worsened throughout his stay.  Broader workup was initiated including search for organic causes, which were all unremarkable.  Repeat head CT was without obvious or large intracranial abnormalities.  Unfortunately MRI was not able to be performed as patient was too encephalopathic to lie still for scanner, not a candidate for sedation, and pacemaker placement necessitating transfer for MRI.  After extensive discussions with infectious disease we suspected that patient has septic emboli in his brain which are not easily identified on CT. Gradually he became more obtunded.  By evaluation on 2024/02/12 patient was very obtunded, no longer responsive to commands or sternal rub.  Patient then went into hypercapnic respiratory failure and required BiPAP to maintain O2 sats.  Family members were called to  the bedside.  After extensive discussion regarding the patient and family wishes they ultimately decided to proceed with comfort measures only.  Patient was transitioned to  comfort measures, BiPAP was removed and patient was placed palliatively on 2 L nasal cannula.  The patient was then declared deceased at 02/24/09 on Feb 16, 2024.  Family was notified.  Sepsis - Septic criteria met with tachycardia, fever, leukocytosis.  Source: Bacteremia  Group B strep bacteremia - Multiple attempts for TEE during this admission.  Completed 5/13.  Negative for vegetations.  No indication to remove pacer wires at this time - Infectious disease consulted - Though TEE is negative, suspicion remains there are septic emboli to his brain Despite appropriate antibiotic therapy. - Podiatry consulted to evaluate foot wounds but does not believe that these are acutely infected at this time. - Repeat blood cultures remain negative. Metabolic Encephalopathy -- Persistently altered mentation, greater than expected.  Suspect there is some role of hospital delirium complicated by sepsis, but would have anticipated slow resolution with infection control. - After further discussion with infectious disease we are suspicious that he is experiencing septic emboli to his brain despite appropriate antibiotic therapy - Prior to this hospitalization patient was independently managing all ADLs and holding a regular shift at [employer]. - Given gradual decline workup was broadened this week.  Patient had repeat brain CT which did not reveal any acute intracranial abnormality.  Ideally he would be able to get a brain MRI but he is unable to stay still for the exam, not a candidate for sedation given tenuous respiratory status and encephalopathy.  Additionally has a pacemaker in place and would likely require transfer to [facility] for this MRI. - Further workup with B12, B1, HIV, RPR, ammonia, TSH has been unrevealing.  All labs have been within normal limits. Foot wounds - Foot wounds growing MRSA and Enterococcus.  ID consulted and believes this is colonization - Patient has been following closely with podiatry  in the clinic - Reengaged podiatry this admission, at this time they do not believe any further debridement or surgical intervention is necessary.   NSTEMI - Troponins elevated and trending 69 > 105 > 326 > 714 > 1300 > 1800. - Was on heparin  drip.  Has since been discontinued.  Acute on chronic heart failure reduced EF - Echo this admission with EF 25 to 30%, global hypokinesis, moderate RV dysfunction, severely elevated PASP, eccentric moderate to severe MR, low-flow low gradient mild to moderate aortic stenosis - Heart failure team was managing, has since signed off - Did have impressive diuresis during this admission Pacemaker status Atrial fibrillation - Medtronic dual pacemaker placed 08/2020 - Continue current medications with beta-blocker - Patient was previously on Eliquis  though this was discontinued in 2022-02-24 due to hematuria.  Cardiology does recommend anticoagulation but patient has historically deferred due to bleeding episodes - EP has been consulted.  TEE without evidence of lead vegetation. CKD stage IIIa - Baseline creatinine appears to be 1.3 Pseudogout left knee - 5/9 arthrocentesis with orthopedic surgery.  Consistent with pseudogout. Hypertension Dyslipidemia Type 2 diabetes with renal manifestations -- A1c 6.9%  Chronic Foley Catheter -- Replaced 5/15.      Procedures: TEE 5/13 Arthrocentesis 5/9  Consultations: Cardiology, Advanced Heart Failure, EP, Infectious Disease, Orthopedic Surgery  The results of significant diagnostics from this hospitalization (including imaging, microbiology, ancillary and laboratory) are listed below for reference.   Significant Diagnostic Studies: DG Chest Port 1 View Result Date: Feb 16, 2024 CLINICAL  DATA:  Tachypnea EXAM: PORTABLE CHEST 1 VIEW COMPARISON:  X-ray 02/05/2024 FINDINGS: Enlarged cardiopericardial silhouette with calcified aorta. Vascular congestion. No pneumothorax or effusion. Overlapping cardiac leads. Left  upper chest battery pack with leads along the right side of the heart. Films are under penetrated. IMPRESSION: Enlarged heart with vascular congestion. Pacemaker. Decreasing left lung base opacity. Electronically Signed   By: Adrianna Horde M.D.   On: 02/18/2024 10:29   CT HEAD WO CONTRAST ( ) Result Date: 02/06/2024 CLINICAL DATA:  Altered mental status EXAM: CT HEAD WITHOUT CONTRAST TECHNIQUE: Contiguous axial images were obtained from the base of the skull through the vertex without intravenous contrast. RADIATION DOSE REDUCTION: This exam was performed according to the departmental dose-optimization program which includes automated exposure control, adjustment of the mA and/or kV according to patient size and/or use of iterative reconstruction technique. COMPARISON:  01/26/2024 FINDINGS: Brain: There is no mass, hemorrhage or extra-axial collection. The size and configuration of the ventricles and extra-axial CSF spaces are normal. There is hypoattenuation of the white matter, most commonly indicating chronic small vessel disease. Vascular: Atherosclerotic calcification of the vertebral and internal carotid arteries at the skull base. No abnormal hyperdensity of the major intracranial arteries or dural venous sinuses. Skull: The visualized skull base, calvarium and extracranial soft tissues are normal. Sinuses/Orbits: No fluid levels or advanced mucosal thickening of the visualized paranasal sinuses. No mastoid or middle ear effusion. Normal orbits. Other: None. IMPRESSION: 1. No acute intracranial abnormality. 2. Chronic small vessel disease. Electronically Signed   By: Juanetta Nordmann M.D.   On: 02/06/2024 20:42   DG Chest Port 1 View Result Date: 02/05/2024 CLINICAL DATA:  Tachypnea EXAM: PORTABLE CHEST 1 VIEW COMPARISON:  Jan 26, 2024 FINDINGS: No change in the left subclavian bipolar ICDF pacemaker device tip of the leads in good position no evidence of discontinuity. Mild bilateral interstitial  prominence with some infiltrates or atelectasis of the left upper lobe could correlate with congestive changes with perhaps superimposed pneumonia or pneumonic infiltrates without significant consolidation Heart and mediastinum normal No pleural effusions IMPRESSION: Mild bilateral interstitial prominence with some infiltrates or atelectasis of the left upper lobe could correlate with congestive changes with perhaps superimposed pneumonia or pneumonic infiltrates without significant consolidation. Electronically Signed   By: Fredrich Jefferson M.D.   On: 02/05/2024 16:18   ECHO TEE Result Date: 02/05/2024    TRANSESOPHOGEAL ECHO REPORT   Patient Name:   SMARAN GAUS Date of Exam: 02/04/2024 Medical Rec #:  [id number]      Height:       78.0 in Accession #:    [id number]     Weight:       238.1 lb Date of Birth:  April 03, 1946      BSA:          2.431 m Patient Age:    77 years       BP:           118/67 mmHg Patient Gender: M              HR:           94 bpm. Exam Location:  [facility] Procedure: Transesophageal Echo, Color Doppler and Cardiac Doppler (Both            Spectral and Color Flow Doppler were utilized during procedure). Indications:     Not listed on TEE check-in sheet  History:         Patient has prior history of Echocardiogram  examinations, most                  recent 01/29/2024. Pacemaker; Risk Factors:Hypertension and                  Dyslipidemia.  Sonographer:     Broadus Canes Referring Phys:  [id number] CARALYN HUDSON Diagnosing Phys: Lanell Pinta Custovic PROCEDURE: The transesophogeal probe was passed without difficulty through the esophogus of the patient. Sedation performed by different physician. [name] vital signs; including heart rate, blood pressure, and oxygen saturation; remained stable throughout the procedure. The patient developed no complications during the procedure.  IMPRESSIONS  1. Left ventricular ejection fraction, by estimation, is 25 to 30%. The left ventricle has severely decreased  function. The left ventricle demonstrates global hypokinesis. Left ventricular diastolic function could not be evaluated.  2. Right ventricular systolic function is normal. The right ventricular size is normal.  3. No left atrial/left atrial appendage thrombus was detected.  4. The mitral valve is normal in structure. Moderate to severe mitral valve regurgitation. No evidence of mitral stenosis.  5. The aortic valve is normal in structure. Aortic valve regurgitation is not visualized. Mild aortic valve stenosis.  6. The inferior vena cava is normal in size with greater than 50% respiratory variability, suggesting right atrial pressure of 3 mmHg. Conclusion(s)/Recommendation(s): No evidence of vegetation/infective endocarditis on this transesophageael echocardiogram. FINDINGS  Left Ventricle: Left ventricular ejection fraction, by estimation, is 25 to 30%. The left ventricle has severely decreased function. The left ventricle demonstrates global hypokinesis. The left ventricular internal cavity size was normal in size. There is no left ventricular hypertrophy. Left ventricular diastolic function could not be evaluated. Right Ventricle: The right ventricular size is normal. No increase in right ventricular wall thickness. Right ventricular systolic function is normal. Left Atrium: Left atrial size was normal in size. No left atrial/left atrial appendage thrombus was detected. Right Atrium: Right atrial size was normal in size. Pericardium: There is no evidence of pericardial effusion. Mitral Valve: The mitral valve is normal in structure. Moderate to severe mitral valve regurgitation. No evidence of mitral valve stenosis. Tricuspid Valve: The tricuspid valve is normal in structure. Tricuspid valve regurgitation is mild. Aortic Valve: The aortic valve is normal in structure. Aortic valve regurgitation is not visualized. Mild aortic stenosis is present. Pulmonic Valve: The pulmonic valve was normal in structure.  Pulmonic valve regurgitation is not visualized. Aorta: The aortic root is normal in size and structure. Venous: The inferior vena cava is normal in size with greater than 50% respiratory variability, suggesting right atrial pressure of 3 mmHg. IAS/Shunts: No atrial level shunt detected by color flow Doppler. Sabina Custovic Electronically signed by Isabell Manzanilla Signature Date/Time: 02/05/2024/4:10:45 PM    Final    DG Knee 1-2 Views Left Result Date: 01/30/2024 CLINICAL DATA:  Knee effusion. EXAM: LEFT KNEE - 2 VIEW COMPARISON:  None Available. FINDINGS: Osteopenia. Joint space loss mildly of the mediolateral compartment and patellofemoral joint with scattered osteophyte formation. Scattered vascular calcifications are identified. There is some chondrocalcinosis as well of the mediolateral compartments. Subchondral cyst formation along the medial tibial plateau. The lateral view are oblique limiting evaluation. No large effusion but evaluation of a a small effusion is limited. Repeat lateral view could be considered versus additional imaging. IMPRESSION: Degenerative changes. Osteopenia. Chondrocalcinosis. No large effusion but lateral views are limited due to rotation. Subtle effusion is not excluded. Further workup as clinically appropriate Electronically Signed   By: Otho Blitz.D.  On: 01/30/2024 16:58   US  EKG SITE RITE Result Date: 01/29/2024 If Site Rite image not attached, placement could not be confirmed due to current cardiac rhythm.  ECHOCARDIOGRAM COMPLETE Result Date: 01/29/2024    ECHOCARDIOGRAM REPORT   Patient Name:   NICKOLAUS BORDELON Date of Exam: 01/28/2024 Medical Rec #:  [id number]      Height:       78.0 in Accession #:    [id number]     Weight:       260.8 lb Date of Birth:  [birth year]      BSA:          2.527 m Patient Age:    77 years       BP:           103/75 mmHg Patient Gender: M              HR:           96 bpm. Exam Location:  [facility] Procedure: 2D Echo, Cardiac Doppler and Color  Doppler (Both Spectral and Color            Flow Doppler were utilized during procedure). Indications:     NSTEMI I21.4  History:         Patient has no prior history of Echocardiogram examinations,                  most recent 07/04/2023. CHF and Cardiomyopathy, Previous                  Myocardial Infarction, Pacemaker, PAD and CKD, stage 3,                  Arrythmias:Bradycardia, Atrial Fibrillation and Atrial Flutter,                  Signs/Symptoms:Dyspnea and Syncope; Risk Factors:Diabetes,                  Hypertension and Dyslipidemia.  Sonographer:     Terrilee Few RCS Referring Phys:  [id number] CARALYN HUDSON Diagnosing Phys: Lida Reeks Alluri IMPRESSIONS  1. Left ventricular ejection fraction, by estimation, is 25 to 30%. The left ventricle has severely decreased function. The left ventricle demonstrates global hypokinesis. There is mild left ventricular hypertrophy. Left ventricular diastolic parameters  are indeterminate.  2. Right ventricular systolic function is moderately reduced. The right ventricular size is mildly enlarged. There is severely elevated pulmonary artery systolic pressure. The estimated right ventricular systolic pressure is 73.9 mmHg.  3. Left atrial size was mildly dilated.  4. Right atrial size was moderately dilated.  5. Eccentric mitral regurgitation which can be underestimated on this study. Recommend TEE for further evaluation. Moderate to severe mitral valve regurgitation. Mild to moderate mitral stenosis. Severe mitral annular calcification.  6. Sclerotic aortic valve with low flow low gradient aortic stenosis, probably mild to moderate.. The aortic valve is tricuspid.  7. Aortic dilatation noted. There is mild dilatation of the aortic root, measuring 44 mm. FINDINGS  Left Ventricle: Left ventricular ejection fraction, by estimation, is 25 to 30%. The left ventricle has severely decreased function. The left ventricle demonstrates global hypokinesis. There is mild left  ventricular hypertrophy. Left ventricular diastolic parameters are indeterminate. Right Ventricle: The right ventricular size is mildly enlarged. No increase in right ventricular wall thickness. Right ventricular systolic function is moderately reduced. There is severely elevated pulmonary artery systolic pressure. The tricuspid regurgitant velocity is 4.06 m/s, and with an assumed right atrial  pressure of 8 mmHg, the estimated right ventricular systolic pressure is 73.9 mmHg. Left Atrium: Left atrial size was mildly dilated. Right Atrium: Right atrial size was moderately dilated. Pericardium: There is no evidence of pericardial effusion. Mitral Valve: Eccentric mitral regurgitation which can be underestimated on this study. Recommend TEE for further evaluation. There is mild thickening of the mitral valve leaflet(s). There is mild calcification of the mitral valve leaflet(s). Severe mitral annular calcification. Moderate to severe mitral valve regurgitation, with anteriorly-directed jet. Mild to moderate mitral valve stenosis. MV peak gradient, 14.7 mmHg. The mean mitral valve gradient is 8.4 mmHg. Tricuspid Valve: The tricuspid valve is normal in structure. Tricuspid valve regurgitation is mild. Aortic Valve: Sclerotic aortic valve with low flow low gradient aortic stenosis, probably mild to moderate. The aortic valve is tricuspid. Aortic valve mean gradient measures 10.0 mmHg. Aortic valve peak gradient measures 16.4 mmHg. Aortic valve area, by  VTI measures 1.43 cm. Pulmonic Valve: The pulmonic valve was not well visualized. Pulmonic valve regurgitation is trivial. Aorta: Aortic dilatation noted. There is mild dilatation of the aortic root, measuring 44 mm. IAS/Shunts: The interatrial septum was not well visualized.  LEFT VENTRICLE PLAX 2D LVIDd:         5.50 cm   Diastology LVIDs:         4.50 cm   LV e' medial:    5.87 cm/s LV PW:         1.60 cm   LV E/e' medial:  22.5 LV IVS:        1.50 cm   LV e' lateral:    10.80 cm/s LVOT diam:     2.30 cm   LV E/e' lateral: 12.2 LV SV:         53 LV SV Index:   21 LVOT Area:     4.15 cm  RIGHT VENTRICLE            IVC RV S prime:     8.59 cm/s  IVC diam: 3.00 cm TAPSE (M-mode): 1.6 cm LEFT ATRIUM              Index        RIGHT ATRIUM           Index LA diam:        5.90 cm  2.33 cm/m   RA Area:     28.30 cm LA Vol (A2C):   114.0 ml 45.11 ml/m  RA Volume:   112.00 ml 44.32 ml/m LA Vol (A4C):   79.7 ml  31.54 ml/m LA Biplane Vol: 95.1 ml  37.63 ml/m  AORTIC VALVE AV Area (Vmax):    1.77 cm AV Area (Vmean):   1.56 cm AV Area (VTI):     1.43 cm AV Vmax:           202.50 cm/s AV Vmean:          144.500 cm/s AV VTI:            0.375 m AV Peak Grad:      16.4 mmHg AV Mean Grad:      10.0 mmHg LVOT Vmax:         86.13 cm/s LVOT Vmean:        54.333 cm/s LVOT VTI:          0.129 m LVOT/AV VTI ratio: 0.34  AORTA Ao Asc diam: 3.80 cm MITRAL VALVE  TRICUSPID VALVE MV Area (PHT): 2.66 cm     TR Peak grad:   65.9 mmHg MV Area VTI:   1.36 cm     TR Vmax:        406.00 cm/s MV Peak grad:  14.7 mmHg MV Mean grad:  8.4 mmHg     SHUNTS MV Vmax:       1.91 m/s     Systemic VTI:  0.13 m MV Vmean:      134.8 cm/s   Systemic Diam: 2.30 cm MV Decel Time: 285 msec MV E velocity: 132.00 cm/s MV A velocity: 98.50 cm/s MV E/A ratio:  1.34 Joetta Mustache Electronically signed by Joetta Mustache Signature Date/Time: 01/29/2024/1:23:56 PM    Final    CT Angio Chest Pulmonary Embolism (PE) W or WO Contrast Result Date: 01/27/2024 CLINICAL DATA:  Chest pain.  PE suspected EXAM: CT ANGIOGRAPHY CHEST WITH CONTRAST TECHNIQUE: Multidetector CT imaging of the chest was performed using the standard protocol during bolus administration of intravenous contrast. Multiplanar CT image reconstructions and MIPs were obtained to evaluate the vascular anatomy. RADIATION DOSE REDUCTION: This exam was performed according to the departmental dose-optimization program which includes automated exposure  control, adjustment of the mA and/or kV according to patient size and/or use of iterative reconstruction technique. CONTRAST:  75mL OMNIPAQUE  IOHEXOL  350 MG/ML SOLN COMPARISON:  Same day chest radiograph FINDINGS: Cardiovascular: Cardiomegaly. Mitral annular calcification. Coronary artery and aortic atherosclerotic calcification. Mild dilation of the ascending aorta measuring 40 mm in diameter. No pericardial effusion. Negative for acute pulmonary embolism. Left chest wall pacemaker. Mediastinum/Nodes: Trachea and esophagus are unremarkable. Shotty mediastinal lymph nodes are likely reactive. Lungs/Pleura: Small right pleural effusion. Interlobular septal thickening and hazy ground-glass opacities with a lower lung and posterior predominance. No focal consolidation, pleural effusion, or pneumothorax. Upper Abdomen: No acute abnormality. Musculoskeletal: No acute fracture. Review of the MIP images confirms the above findings. IMPRESSION: 1. Negative for acute pulmonary embolism. 2. Findings suggestive of CHF with mild pulmonary edema. Small right pleural effusion. 3. 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 Recommend annual imaging followup by CTA or MRA. This recommendation follows 2010 ACCF/AHA/AATS/ACR/ASA/SCA/SCAI/SIR/STS/SVM Guidelines for the Diagnosis and Management of Patients with Thoracic Aortic Disease. Circulation. 2010; 121: Z610-R604. Aortic aneurysm NOS (ICD10-I71.9) 4. Aortic Atherosclerosis (ICD10-I70.0). Electronically Signed   By: Rozell Cornet M.D.   On: 01/27/2024 22:11   CT Head Wo Contrast Result Date: 01/26/2024 CLINICAL DATA:  Mental status change. EXAM: CT HEAD WITHOUT CONTRAST TECHNIQUE: Contiguous axial images were obtained from the base of the skull through the vertex without intravenous contrast. RADIATION DOSE REDUCTION: This exam was performed according to the departmental dose-optimization program which includes automated exposure control, adjustment of the mA and/or kV  according to patient size and/or use of iterative reconstruction technique. COMPARISON:  Head CT 07/04/2018. FINDINGS: Brain: No evidence of acute infarction, hemorrhage, hydrocephalus, extra-axial collection or mass lesion/mass effect. There is mild diffuse atrophy and mild periventricular white matter hypodensity which is similar to the prior study. Vascular: Atherosclerotic calcifications are present within the cavernous internal carotid arteries. Skull: Normal. Negative for fracture or focal lesion. Sinuses/Orbits: No acute finding. Other: None. IMPRESSION: 1. No acute intracranial process. 2. Mild diffuse atrophy and mild chronic small vessel ischemic changes. Electronically Signed   By: Tyron Gallon M.D.   On: 01/26/2024 20:18   DG Chest Portable 1 View Result Date: 01/26/2024 CLINICAL DATA:  Chest pain EXAM: PORTABLE CHEST 1 VIEW COMPARISON:  09/11/2023 FINDINGS: Left-sided pacing  device similar in position. Cardiomegaly with mild central congestion. No consolidation, pleural effusion or pneumothorax. IMPRESSION: Cardiomegaly with mild central congestion. Electronically Signed   By: Esmeralda Hedge M.D.   On: 01/26/2024 20:14    Microbiology: Recent Results (from the past 240 hours)  Culture, blood (Routine X 2) w Reflex to ID Panel     Status: None   Collection Time: 01/29/24  6:11 PM   Specimen: BLOOD  Result Value Ref Range Status   Specimen Description BLOOD BLOOD RIGHT HAND  Final   Special Requests   Final    BOTTLES DRAWN AEROBIC AND ANAEROBIC Blood Culture adequate volume   Culture   Final    NO GROWTH 5 DAYS Performed at [facility], [address], [place], [place] [address]    Report Status 02/03/2024 FINAL  Final  Culture, blood (Routine X 2) w Reflex to ID Panel     Status: None   Collection Time: 01/29/24  7:42 PM   Specimen: BLOOD  Result Value Ref Range Status   Specimen Description BLOOD BLOOD RIGHT HAND  Final   Special Requests   Final    BOTTLES DRAWN  AEROBIC AND ANAEROBIC Blood Culture adequate volume   Culture   Final    NO GROWTH 5 DAYS Performed at [facility], [address] [place], [place] [address]    Report Status 02/03/2024 FINAL  Final  Body fluid culture w Gram Stain     Status: None   Collection Time: 01/30/24  4:00 PM   Specimen: Synovium; Body Fluid  Result Value Ref Range Status   Specimen Description   Final    SYNOVIAL Performed at [facility], [address] [place], [place] [address]    Special Requests   Final    L KNEE Performed at [facility], [address] [place], [place] [address]    Gram Stain   Final    MODERATE WBC PRESENT, PREDOMINANTLY PMN NO ORGANISMS SEEN    Culture   Final    NO GROWTH 3 DAYS Performed at [facility], [address] [place], [place] [address]    Report Status 02/04/2024 FINAL  Final  Culture, blood (Routine X 2) w Reflex to ID Panel     Status: None (Preliminary result)   Collection Time: 02/09/2024 12:01 PM   Specimen: BLOOD  Result Value Ref Range Status   Specimen Description BLOOD BLOOD RIGHT HAND  Final   Special Requests   Final    BOTTLES DRAWN AEROBIC AND ANAEROBIC Blood Culture results may not be optimal due to an inadequate volume of blood received in culture bottles   Culture   Final    NO GROWTH < 24 HOURS Performed at [facility], [address], [place], [place] [address]    Report Status PENDING  Incomplete  Culture, blood (Routine X 2) w Reflex to ID Panel     Status: None (Preliminary result)   Collection Time: 02/06/2024 12:06 PM   Specimen: BLOOD  Result Value Ref Range Status   Specimen Description BLOOD RIGHT ANTECUBITAL  Final   Special Requests   Final    BOTTLES DRAWN AEROBIC AND ANAEROBIC Blood Culture adequate volume   Culture   Final    NO GROWTH < 24 HOURS Performed at [facility], [address], [place], [place] [address]    Report Status PENDING  Incomplete    Time  spent: 35 minutes  Signed:  Lizbet Cirrincione, DO 02/08/2024

## 2024-02-23 NOTE — Progress Notes (Signed)
 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  Vital signs in the ED reveal tachycardia.  EKG reveals A-fib RVR with PVCs and LBBB.  CT head was negative for any acute intracranial abnormality.  Chest x-ray with cardiomegaly and mild central congestion.  Patient was started on heparin  infusion and cardiology was consulted.  Subsequently patient had worsening leukocytosis with a nonanion gap metabolic acidosis and elevated BNP over 4000.  Troponin also continued to increase.  Cardiology initiated the patient on IV Bumex  and ordered an echocardiogram.  On 5/6 a rapid response was called and patient became increasingly tachycardic and tachypneic and was found to be febrile at 102.9.  He was also found to have worsening right lower extremity erythema with signs of venous congestion and full-thickness diabetic foot ulcers involving the left foot.  Blood cultures were obtained and patient was initially on broad-spectrum antibiotics.  Blood cultures grew GBS and infectious disease was consulted.  Patient was planned for TEE but it was canceled on 5/9 due to altered mentation.  Patient had TEE on 5/13 which was negative for endocarditis.  Patient remained on heparin  drip until 5/14 at which time cardiology discontinued the medication. Patient's foot wound grew Enterococcus and MRSA believed to be colonization.  IV antibiotics were changed to IV vancomycin  on 5/11.  Patient underwent TEE on 5/13  which was negative for vegetation.  Antibiotics were de-escalated back to cefazolin .  Patient has continued to have decline.  Encephalopathy has been persistent.  Patient is becoming increasingly obtunded.  Subjective: Called to bedside this morning for hypoxia.  On arrival patient is obtunded, tachypneic, O2 sats 82% on nonrebreather.  Have requested for BiPAP.  ABG ordered reveals pH 7.17, CO2 69. Patient is unresponsive to sternal rub I have called and discussed his deteriorating clinical status with his daughter Tyler [name] and confirmed CODE STATUS is DNR/DNI.  Requested she come to bedside. Stat chest x-ray reveals some pulmonary congestion though unimpressive and largely unchanged from prior.  I have ordered for additional IV push of Lasix  but patient appears clinically euvolemic.  Have changed antibiotics to Zosyn  given concern for aspiration.   Objective: Vitals:   02/06/24 2305 02/03/2024 0400 02/06/2024 0500 02/22/2024 0700  BP: (!) 130/102 (!) 111/94  (!) 150/55  Pulse: (!) 106 (!) 102    Resp: (!) 29 (!) 42    Temp: 99.1 F (37.3 C) 97.9 F (36.6 C)  98.4 F (36.9 C)  TempSrc: Axillary Axillary  Oral  SpO2: 97% 99%    Weight:   97 kg   Height:        Intake/Output Summary (Last 24 hours) at 01/30/2024 0936 Last data filed at 02/22/2024 8119 Gross per 24 hour  Intake 400 ml  Output 550 ml  Net -150 ml   Filed Weights   02/05/24 0500 02/06/24 0459 02/10/2024 0500  Weight: 107.6 kg 102.4 kg 97 kg    Examination: General exam: Calm, uncomfortable.  Mumbling unintelligibly Respiratory system: No work of breathing, symmetric chest wall expansion Cardiovascular system: S1 &  S2 heard, RRR.  Gastrointestinal system: Abdomen is nondistended, soft and nontender.  Neuro: Drowsy but arousable.  Speech is unintelligible at times  Assessment & Plan:  Principal Problem:   NSTEMI (non-ST elevated myocardial infarction) (HCC) Active Problems:   Acute kidney injury superimposed on stage  3a chronic kidney disease (HCC) - baseline scr 1.3   Septicemia due to group B Streptococcus (HCC)   Sepsis due to cellulitis (HCC)   Acute on chronic systolic CHF (congestive heart failure) (HCC)   Type II diabetes mellitus with renal manifestations (HCC)   Dyslipidemia   Essential hypertension   Pressure injury of skin   Pseudogout of left knee   Ulcerated, foot, right, with fat layer exposed (HCC)   Ulcerated, foot, left, with fat layer exposed (HCC)   Chronic respiratory failure with hypoxia (HCC)   Effusion of left knee   Bacteremia due to group B Streptococcus   Diabetic ulcer of right foot associated with type 2 diabetes mellitus, with fat layer exposed (HCC)   Malnutrition of moderate degree   Encephalopathy acute     Guarded prognosis - Patient has been persistently encephalopathic.  Workup for organic causes has been largely within normal limits.  Unfortunately we are unable to perform MRI due to his tenuous respiratory status, inability to lie flat and still, and pacemaker presence.  Given his gradual decline I do suspect he may be throwing septic emboli to his brain.  He may also have septic emboli in his lungs and a chest CT was planned for today but given his decompensated respiratory status he is unlikely to lie flat for the CT scanner and it is unlikely to change management.  Have escalated antibiotics to Zosyn  for now. Additionally given his persistent encephalopathy he remains very high risk for aspiration.  I have made him strict NPO.  Patient's prior wishes were very clear to his family that he would not want artificial nutrition of any kind. Patient is on BiPAP and O2 sats have improved some but he remains obtunded. Unfortunately, I anticipate further decline.  Patient's daughters are coming to bedside.  Upon arrival we will discuss their wishes, and transition to comfort measures only if that is what they choose. Palliative care has been consulted and is following  along  Sepsis - Septic criteria met with tachycardia, fever, leukocytosis.  Source: Bacteremia  Group B strep bacteremia - Multiple attempts for TEE during this admission.  Completed 5/13.  Negative for vegetations.  No indication to remove pacer wires at this time - Infectious disease consulted and following. - Though TEE is negative, suspicion remains high that patient could be sending septic emboli to his brain.  This is likely because of his persistent encephalopathy. - Given patient's persistent encephalopathy, 7 changed to ampicillin on 5/15.  Have escalated again to Zosyn  today given rising leukocytosis, new fever.  At minimum will need 6 weeks of IV therapy. - Podiatry consulted to evaluate foot wounds but does not believe that these are acutely infected at this time. - Repeat blood cultures remain negative.  Metabolic Encephalopathy -- Persistently altered mentation, greater than expected.  Suspect there is some role of hospital delirium complicated by sepsis, but would have anticipated slow resolution with infection control. - After further discussion with infectious disease we are suspicious that he is experiencing septic emboli to his brain despite appropriate antibiotic therapy - Prior to this hospitalization patient was independently managing all ADLs and holding a regular shift at [employer]. - Given  gradual decline workup was broadened this week.  Patient had repeat brain CT which did not reveal any acute intracranial abnormality.  Ideally he would be able to get a brain MRI but he is unable to stay still for the exam, not a candidate for sedation given tenuous respiratory status and encephalopathy.  Additionally has a pacemaker in place and would likely require transfer to [facility] for this MRI. - Further workup with B12, B1, HIV, RPR, ammonia, TSH has been unrevealing.  All labs have been within normal limits. - Acute worsening mental status today likely secondary to  hypercapnia in the setting of all of the above  Foot wounds - Foot wounds growing MRSA and Enterococcus.  ID consulted and believes this is colonization - Patient has been following closely with podiatry in the clinic - Reengaged podiatry this admission, at this time they do not believe any further debridement or surgical intervention is necessary.  Have provided local wound care instructions  NSTEMI - Troponins elevated and trending 69 > 105 > 326 > 714 > 1300 > 1800. - Was on heparin  drip.  Has since been discontinued. - Patient would still benefit from ischemic evaluation though this would need to be done as an outpatient - Cardiology signed off today - Continue with aspirin  and statin if patient is able to come off of BiPAP and tolerate p.o. status  Acute on chronic heart failure reduced EF - Echo this admission with EF 25 to 30%, global hypokinesis, moderate RV dysfunction, severely elevated PASP, eccentric moderate to severe MR, low-flow low gradient mild to moderate aortic stenosis - Heart failure team was managing, has since signed off - Patient is on daily Bumex  but given vascular congestion on chest x-ray with worsening respiratory status provided an additional 40 mg IV push of Lasix .  On clinical exam he still appears dry - Continue to follow strict I's and O's - Did have impressive diuresis during this admission  Pacemaker status Atrial fibrillation - Med[employer] dual pacemaker placed 08/2020 - Continue current medications with beta-blocker - Patient was previously on Eliquis  though this was discontinued in 2023 due to hematuria.  Cardiology does recommend anticoagulation but patient has historically deferred due to bleeding episodes - Have discussed stroke risk with daughter Tyler [name] again. - EP has been consulted.  TEE without evidence of lead vegetation. - Keep potassium above 4, mag above 2  CKD stage IIIa - Baseline creatinine appears to be 1.3 - Acute worsening today  is likely multifactorial.  BUN rising as well.  Uremia may be playing a role in his encephalopathy. - Continue close monitoring of creatinine and kidney function.   Pseudogout left knee - 5/9 arthrocentesis with orthopedic surgery.  Consistent with pseudogout. -Pain management as needed  Hypertension - Continue daily monitoring and medication titration  Dyslipidemia - Continue statin  Type 2 diabetes with renal manifestations - Continue sliding scale insulin , basal/bolus.  Titrate as needed - Home dose glipizide  and metformin  on hold -- A1c 6.9%  Chronic Foley Catheter -- Replaced 5/15.    DVT prophylaxis: SCDs   Code Status: Limited: Do not attempt resuscitation (DNR) -DNR-LIMITED -Do Not Intubate/DNI  Disposition: Prognosis guarded.  Consultants:  Treatment Team:  Consulting Physician: Venus Ginsberg, MD Consulting Physician: Evertt Hoe, [name]  Procedures:    Antimicrobials:  Anti-infectives (From admission, onward)    Start     Dose/Rate Route Frequency Ordered Stop   02/06/24 1600  ampicillin (OMNIPEN) 2 g in sodium chloride  0.9 %  100 mL IVPB        2 g 300 mL/hr over 20 Minutes Intravenous Every 4 hours 02/06/24 1158     02/04/24 1800  cefTRIAXone  (ROCEPHIN ) 2 g in sodium chloride  0.9 % 100 mL IVPB  Status:  Discontinued        2 g 200 mL/hr over 30 Minutes Intravenous Every 24 hours 02/03/24 2329 02/06/24 1158   02/02/24 1800  vancomycin  (VANCOREADY) IVPB 1750 mg/350 mL  Status:  Discontinued        1,750 mg 175 mL/hr over 120 Minutes Intravenous Every 24 hours 02/01/24 1729 02/03/24 2328   02/01/24 2015  vancomycin  (VANCOREADY) IVPB 500 mg/100 mL       Placed in "Followed by" Linked Group   500 mg 100 mL/hr over 60 Minutes Intravenous  Once 02/01/24 1727 02/01/24 2245   02/01/24 1815  vancomycin  (VANCOREADY) IVPB 1750 mg/350 mL       Placed in "Followed by" Linked Group   1,750 mg 175 mL/hr over 120 Minutes Intravenous  Once 02/01/24 1727  02/01/24 2017   01/28/24 1600  vancomycin  (VANCOREADY) IVPB 2000 mg/400 mL  Status:  Discontinued        2,000 mg 200 mL/hr over 120 Minutes Intravenous Every 24 hours 01/27/24 2009 01/28/24 0844   01/28/24 1600  cefTRIAXone  (ROCEPHIN ) 2 g in sodium chloride  0.9 % 100 mL IVPB  Status:  Discontinued        2 g 200 mL/hr over 30 Minutes Intravenous Daily 01/28/24 0844 02/01/24 1728   01/28/24 0800  ceFEPIme  (MAXIPIME ) 2 g in sodium chloride  0.9 % 100 mL IVPB  Status:  Discontinued        2 g 200 mL/hr over 30 Minutes Intravenous Every 12 hours 01/27/24 1950 01/28/24 0844   01/27/24 2030  ceFEPIme  (MAXIPIME ) 2 g in sodium chloride  0.9 % 100 mL IVPB        2 g 200 mL/hr over 30 Minutes Intravenous  Once 01/27/24 1942 01/27/24 2242   01/27/24 2030  metroNIDAZOLE  (FLAGYL ) IVPB 500 mg  Status:  Discontinued        500 mg 100 mL/hr over 60 Minutes Intravenous Every 12 hours 01/27/24 1942 01/28/24 0844   01/27/24 2030  vancomycin  (VANCOCIN ) IVPB 1000 mg/200 mL premix  Status:  Discontinued        1,000 mg 200 mL/hr over 60 Minutes Intravenous  Once 01/27/24 1942 01/27/24 1948   01/27/24 1945  vancomycin  (VANCOREADY) IVPB 2000 mg/400 mL        2,000 mg 200 mL/hr over 120 Minutes Intravenous  Once 01/27/24 1948 01/27/24 2304   01/26/24 2300  doxycycline  (VIBRA -TABS) tablet 100 mg  Status:  Discontinued        100 mg Oral 2 times daily 01/26/24 2254 01/26/24 2346       Data Reviewed: I have personally reviewed following labs and imaging studies CBC: Recent Labs  Lab 02/03/24 0351 02/04/24 0618 02/05/24 0233 02/06/24 0419 01/29/2024 0727  WBC 11.2* 11.1* 10.8* 13.5* 18.9*  NEUTROABS  --   --   --   --  16.6*  HGB 11.2* 11.5* 11.1* 11.8* 12.2*  HCT 35.9* 37.7* 35.4* 38.5* 40.8  MCV 86.9 86.7 85.5 86.5 87.6  PLT 234 265 273 297 352   Basic Metabolic Panel: Recent Labs  Lab 02/02/24 0400 02/03/24 0351 02/04/24 0618 02/05/24 0233 02/06/24 0419  NA 139 139 141 140 144  K 3.8 3.8 3.8  3.8 3.8  CL 104 106 108  111 111  CO2 21* 21* 21* 21* 22  GLUCOSE 171* 189* 197* 205* 194*  BUN 74* 67* 63* 62* 63*  CREATININE 1.41* 1.46* 1.34* 1.21 1.34*  CALCIUM  8.8* 8.9 9.3 8.8* 9.1  MG  --  2.5*  --   --   --    GFR: Estimated Creatinine Clearance: 57.8 mL/min (A) (by C-G formula based on SCr of 1.34 mg/dL (H)). Liver Function Tests: No results for input(s): "AST", "ALT", "ALKPHOS", "BILITOT", "PROT", "ALBUMIN" in the last 168 hours. CBG: Recent Labs  Lab 02/06/24 0743 02/06/24 1112 02/06/24 1612 02/06/24 2127 02/22/2024 0818  GLUCAP 208* 268* 205* 207* 248*    Recent Results (from the past 240 hours)  Aerobic Culture w Gram Stain (superficial specimen)     Status: None   Collection Time: 01/29/24  6:35 AM   Specimen: Foot; Wound  Result Value Ref Range Status   Specimen Description   Final    FOOT RIGHT Performed at [facility], [address] [place], [place] [address]    Special Requests   Final    NONE Performed at [facility], [address] [place], [place] [address]    Gram Stain NO WBC SEEN RARE GRAM POSITIVE COCCI IN PAIRS   Final   Culture   Final    RARE STAPHYLOCOCCUS AUREUS SUSCEPTIBILITIES PERFORMED ON PREVIOUS CULTURE WITHIN THE LAST 5 DAYS. ABUNDANT CORYNEBACTERIUM STRIATUM Standardized susceptibility testing for this organism is not available. Performed at [facility], [address] [place], [place] [address]    Report Status 02/01/2024 FINAL  Final  Aerobic Culture w Gram Stain (superficial specimen)     Status: None   Collection Time: 01/29/24  6:37 AM   Specimen: Foot; Wound  Result Value Ref Range Status   Specimen Description   Final    FOOT LEFT Performed at [name], [address] [place], [place] [address]    Special Requests   Final    NONE Performed at [facility], [address] [place], [place] [address]    Gram Stain   Final    NO WBC SEEN RARE GRAM POSITIVE  COCCI IN PAIRS Performed at [facility], [address], [place], [place] [address]    Culture   Final    RARE METHICILLIN RESISTANT STAPHYLOCOCCUS AUREUS RARE ENTEROCOCCUS FAECALIS RARE GROUP B STREP(S.AGALACTIAE)ISOLATED TESTING AGAINST S. AGALACTIAE NOT ROUTINELY PERFORMED DUE TO PREDICTABILITY OF AMP/PEN/VAN SUSCEPTIBILITY. RARE FUNGUS (MOLD) ISOLATED, PROBABLE CONTAMINANT/COLONIZER (SAPROPHYTE). CONTACT MICROBIOLOGY IF FURTHER IDENTIFICATION REQUIRED [phone].    Report Status 02/01/2024 FINAL  Final   Organism ID, Bacteria METHICILLIN RESISTANT STAPHYLOCOCCUS AUREUS  Final   Organism ID, Bacteria ENTEROCOCCUS FAECALIS  Final      Susceptibility   Enterococcus faecalis - MIC*    AMPICILLIN <=2 SENSITIVE Sensitive     VANCOMYCIN  1 SENSITIVE Sensitive     GENTAMICIN  SYNERGY SENSITIVE Sensitive     * RARE ENTEROCOCCUS FAECALIS   Methicillin resistant staphylococcus aureus - MIC*    CIPROFLOXACIN  >=8 RESISTANT Resistant     ERYTHROMYCIN >=8 RESISTANT Resistant     GENTAMICIN  <=0.5 SENSITIVE Sensitive     OXACILLIN >=4 RESISTANT Resistant     TETRACYCLINE <=1 SENSITIVE Sensitive     VANCOMYCIN  1 SENSITIVE Sensitive     TRIMETH /SULFA  <=10 SENSITIVE Sensitive     CLINDAMYCIN <=0.25 SENSITIVE Sensitive     RIFAMPIN <=0.5 SENSITIVE Sensitive     Inducible Clindamycin NEGATIVE Sensitive  LINEZOLID 2 SENSITIVE Sensitive     * RARE METHICILLIN RESISTANT STAPHYLOCOCCUS AUREUS  Culture, blood (Routine X 2) w Reflex to ID Panel     Status: None   Collection Time: 01/29/24  6:11 PM   Specimen: BLOOD  Result Value Ref Range Status   Specimen Description BLOOD BLOOD RIGHT HAND  Final   Special Requests   Final    BOTTLES DRAWN AEROBIC AND ANAEROBIC Blood Culture adequate volume   Culture   Final    NO GROWTH 5 DAYS Performed at [facility], [address], [place], [place] [address]    Report Status 02/03/2024 FINAL  Final  Culture, blood (Routine X 2) w Reflex  to ID Panel     Status: None   Collection Time: 01/29/24  7:42 PM   Specimen: BLOOD  Result Value Ref Range Status   Specimen Description BLOOD BLOOD RIGHT HAND  Final   Special Requests   Final    BOTTLES DRAWN AEROBIC AND ANAEROBIC Blood Culture adequate volume   Culture   Final    NO GROWTH 5 DAYS Performed at [facility], [address] [place], [place] [address]    Report Status 02/03/2024 FINAL  Final  Body fluid culture w Gram Stain     Status: None   Collection Time: 01/30/24  4:00 PM   Specimen: Synovium; Body Fluid  Result Value Ref Range Status   Specimen Description   Final    SYNOVIAL Performed at [facility], [address] [place], [place] [address]    Special Requests   Final    L KNEE Performed at [facility], [address] [place], [place] [address]    Gram Stain   Final    MODERATE WBC PRESENT, PREDOMINANTLY PMN NO ORGANISMS SEEN    Culture   Final    NO GROWTH 3 DAYS Performed at [facility], [address] [place], [place] [address]    Report Status 02/04/2024 FINAL  Final     Radiology Studies: CT HEAD WO CONTRAST ( ) Result Date: 02/06/2024 CLINICAL DATA:  Altered mental status EXAM: CT HEAD WITHOUT CONTRAST TECHNIQUE: Contiguous axial images were obtained from the base of the skull through the vertex without intravenous contrast. RADIATION DOSE REDUCTION: This exam was performed according to the departmental dose-optimization program which includes automated exposure control, adjustment of the mA and/or kV according to patient size and/or use of iterative reconstruction technique. COMPARISON:  01/26/2024 FINDINGS: Brain: There is no mass, hemorrhage or extra-axial collection. The size and configuration of the ventricles and extra-axial CSF spaces are normal. There is hypoattenuation of the white matter, most commonly indicating chronic small vessel disease. Vascular: Atherosclerotic calcification of the  vertebral and internal carotid arteries at the skull base. No abnormal hyperdensity of the major intracranial arteries or dural venous sinuses. Skull: The visualized skull base, calvarium and extracranial soft tissues are normal. Sinuses/Orbits: No fluid levels or advanced mucosal thickening of the visualized paranasal sinuses. No mastoid or middle ear effusion. Normal orbits. Other: None. IMPRESSION: 1. No acute intracranial abnormality. 2. Chronic small vessel disease. Electronically Signed   By: Juanetta Nordmann M.D.   On: 02/06/2024 20:42   DG Chest Port 1 View Result Date: 02/05/2024 CLINICAL DATA:  Tachypnea EXAM: PORTABLE CHEST 1 VIEW COMPARISON:  Jan 26, 2024 FINDINGS: No change in the left subclavian bipolar ICDF pacemaker device tip of the leads in good position no evidence of discontinuity. Mild bilateral interstitial  prominence with some infiltrates or atelectasis of the left upper lobe could correlate with congestive changes with perhaps superimposed pneumonia or pneumonic infiltrates without significant consolidation Heart and mediastinum normal No pleural effusions IMPRESSION: Mild bilateral interstitial prominence with some infiltrates or atelectasis of the left upper lobe could correlate with congestive changes with perhaps superimposed pneumonia or pneumonic infiltrates without significant consolidation. Electronically Signed   By: Fredrich Jefferson M.D.   On: 02/05/2024 16:18    Scheduled Meds:  vitamin C   500 mg Oral BID   aspirin   81 mg Oral Daily   atorvastatin   80 mg Oral q1800   bumetanide   1 mg Oral Daily   Chlorhexidine  Gluconate Cloth  6 each Topical Daily   feeding supplement (NEPRO CARB STEADY)  237 mL Oral TID BM   gentamicin  cream  1 Application Topical BID   insulin  aspart  0-15 Units Subcutaneous TID WC   insulin  aspart  0-5 Units Subcutaneous QHS   insulin  glargine-yfgn  5 Units Subcutaneous QHS   losartan   12.5 mg Oral Daily   metoprolol  succinate  25 mg Oral Daily    multivitamin with minerals  1 tablet Oral Daily   mupirocin  ointment   Topical Daily   silver  sulfADIAZINE    Topical Daily   Continuous Infusions:  ampicillin (OMNIPEN) IV 2 g (01/27/2024 0826)     LOS: 10 days  CRITICAL CARE Performed by: Akili Cuda   Total critical care time: 57 minutes Critical care time was exclusive of separately billable procedures and treating other patients. Critical care was necessary to treat or prevent imminent or life-threatening deterioration. Critical care was time spent personally by me on the following activities: development of treatment plan with patient and/or surrogate as well as nursing, discussions with consultants, evaluation of patient's response to treatment, examination of patient, obtaining history from patient or surrogate, ordering and performing treatments and interventions, ordering and review of laboratory studies, ordering and review of radiographic studies, pulse oximetry and re-evaluation of patient's condition.  [facility]  To contact the attending physician between 7A-7P please use Epic Chat. To contact the covering physician during after hours 7P-7A, please review Amion.  01/27/2024, 9:36 AM   *This document has been created with the assistance of dictation software. Please excuse typographical errors. *

## 2024-02-23 NOTE — Progress Notes (Signed)
 Family in at shift change, let staff know they were leaving around 8:45, this writer in to check on pt @ 2108 pt agonal breathing, heart rate on monitor seems to show only paced rhythm, with no respirations. Charge nurse notified, this Passenger transport manager verified no spontaneous breathing, pulse or signs of life. Pt daughter  [name] notified @2110 , family returned and removed all pt personal belonging and picked out a funeral home.

## 2024-02-23 NOTE — Progress Notes (Signed)
 Daily Progress Note   Patient Name: Tyler [name]       Date: 02/13/2024 DOB: 1946-08-16  Age: 78 y.o. MRN#: [id number] Attending Physician: Dezii, Alexandra, DO Primary Care Physician: McClanahan, Kyra, NP Admit Date: 01/26/2024  Reason for Consultation/Follow-up: Establishing goals of care  HPI/Brief Hospital Review: 78 y.o. male  with past medical history of HFpEF, A-fib/flutter, AV block s/p PPM (2021), CAD, CKD stage III, hypertension and hyperlipidemia admitted from home on 01/26/2024 with dizziness, lightheadedness and confusion.  Also reported dull chest pain.   EKG on arrival showed A-fib with RVR, PVCs and L BBB Elevated troponin and significantly elevated BNP Chest x-ray with cardiomegaly and congestion   During this admission found to have positive blood cultures growing group B streptococcus, ID consulted Concern for endocarditis and or pacing wire vegetation as well as concern for left knee septic arthritis S/p left knee aspiration   Advanced heart failure team also following   Palliative medicine was consulted for assisting with goals of care conversations  Subjective: Extensive chart review has been completed prior to meeting patient including labs, vital signs, imaging, progress notes, orders, and available advanced directive documents from current and previous encounters.    Visited with Tyler [name] earlier in the shift.  Nursing staff at bedside, Tyler [name] placed on BiPAP due to worsening hypoxia.  According to nursing staff family had been made aware of decompensation from primary team and were on their way to the hospital.  Return to bedside met daughter outside of the room and hallway.  Daughter shares decision has been made to transition Tyler [name] to comfort  measures only.  Multiple family members at bedside visiting.  Tyler [name] appears comfortable without obvious signs of discomfort or distress.  PMT to step away from daily visits but will remain available peripherally for needs or concerns as they arise.  Thank you for allowing the Palliative Medicine Team to assist in the care of this patient.  Total time:  25 minutes  Time spent includes: Detailed review of medical records (labs, imaging, vital signs), medically appropriate exam (mental status, respiratory, cardiac, skin), discussed with treatment team, counseling and educating patient, family and staff, documenting clinical information, medication management and coordination of care.  Tyler Marble, DNP, AGNP-C Palliative Medicine   Please contact Palliative Medicine Team phone  at [phone] for questions and concerns.

## 2024-02-23 NOTE — Progress Notes (Signed)
 CCMD called me to notify that pt's Spo2 was down. Went immediately and pt was dusky with O2 sats 83%. Pt still on non-rebreather. I called Respiratory and they came to assess pt along with Dr. Marquette Sites. Pt put on bipap and daughter called by Dr. Marquette Sites.

## 2024-02-23 NOTE — Progress Notes (Signed)
 OT Cancellation Note  Patient Details Name: Tyler [name] MRN: [id number] DOB: 1946/05/09   Cancelled Treatment:    Reason Eval/Treat Not Completed: Medical issues which prohibited therapy. Per chart review, pt with respiratory distress and placed on Bipap. OT to hold treatment secondary to change in status.  George Kinder, MS, OTR/L , CBIS ascom [phone]  02/02/2024, 11:46 AM

## 2024-02-23 NOTE — Progress Notes (Signed)
 Pt unable to take any PO meds this morning d/t obtunded. Dr.notified.

## 2024-02-23 NOTE — Progress Notes (Signed)
 I was able to speak with patient's daughters, [name] and [name] at the bedside.  Also present were patient's multiple grandchildren and son-in-laws.  We discussed extensively the patient's hospital course and his present respiratory distress, and worsening encephalopathy.  [name] family is clear that they do not want the patient to suffer any longer.  Keeping in line with his wishes, they would like to proceed with comfort measures only. We we will proceed with removal of BiPAP after patient has been premedicated with morphine  to medicate for air hunger. Proceed with qshift only vitals.  Turn off all alarms.  Provide only medications that provide the patient immediate comfort.  I have communicated these orders directly to his bedside RN.

## 2024-02-23 NOTE — Inpatient Diabetes Management (Signed)
 Inpatient Diabetes Program Recommendations  AACE/ADA: New Consensus Statement on Inpatient Glycemic Control (2015)  Target Ranges:  Prepandial:   less than 140 mg/dL      Peak postprandial:   less than 180 mg/dL (1-2 hours)      Critically ill patients:  140 - 180 mg/dL    Latest Reference Range & Units 02/06/24 07:43 02/06/24 11:12 02/06/24 16:12 02/06/24 21:27  Glucose-Capillary 70 - 99 mg/dL 409 (H) 811 (H) 914 (H) 207 (H)  (H): Data is abnormally high  Latest Reference Range & Units 02/19/2024 08:18  Glucose-Capillary 70 - 99 mg/dL 782 (H)  (H): Data is abnormally high   Home DM Meds: Glipizide  10 mg BID                             Metformin  1000 mg BID (NOT taking)   Current Orders: Semglee  5 units at bedtime                           Novolog  Moderate Correction Scale/ SSI (0-15 units) TID AC + HS       MD- Please consider increasing the Semglee  to 10 units at bedtime    --Will follow patient during hospitalization--  Langston Pippins RN, MSN, CDCES Diabetes Coordinator Inpatient Glycemic Control Team Team Pager: [phone] (8a-5p)

## 2024-02-23 NOTE — Progress Notes (Signed)
 [facility] CLINIC CARDIOLOGY PROGRESS NOTE       Patient ID: Tyler [name] MRN: [id number] DOB/AGE: 80-Mar-1947 80 y.o.  Admit date: 01/26/2024 Referring Physician Dr. Amalia Jung Primary Physician Jewell Mose, NP  Primary Cardiologist Dr. Bob Burn Reason for Consultation chest pain  HPI: Tyler [name] is a 78 y.o. male  with a past medical history of chronic HFrEF, persistent atrial fibrillation, mitral insufficiency, CHB s/p PPM 2021, hypertension, chronic kidney disease stage III who presented to the ED on 01/26/2024 for chest pain. Cardiology was consulted for further evaluation.   Interval history: -Patient seen and examined this morning. Patient not very responsive. -BP and HR controlled.  Review of systems complete and found to be negative unless listed above    Past Medical History:  Diagnosis Date   (HFpEF) heart failure with preserved ejection fraction (HCC) 03/01/2020   a.) TTE 03/01/2020: EF 55-60%, mod MAC, mod AoV sclerosis, triv AR, mild TR, mod MR, RVSP 50-59; b.) TTE 09/10/2020: EF 55-60%, mod MAC, mod AoV sclerosis, mild TR, 3+ MR, RVSP 50-59; c.) TTE 09/26/2020: EF 55-60%, mild LA dil, triv PR, mild MR/TR, RVSP 37-49   Adenoma of left adrenal gland    Anemia    Arthritis    Atrial fibrillation and flutter (HCC)    a.) CHA2DS2VASc = 5 (age x2, HFpEF, HTN, T2DM);  b.) s/p CTI ablation 09/07/2020; c.) rate/rhythm maintained on oral carvedilol ; not on chronic anticoagulation therapy   CAD (coronary artery disease)    Cardiomegaly    CKD (chronic kidney disease), stage III (HCC)    DOE (dyspnea on exertion)    Drug-induced bradycardia    Gangrene of toe of left foot (HCC)    a.) 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   H/O active rheumatic fever 08/22/2020   Hepatosplenomegaly    History of bilateral cataract extraction    HLD (hyperlipidemia)    Hypertension    Long term current use of aspirin     Lymphedema of both lower extremities    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)    a.) s/p amputation 11/04/2022   Peripheral vascular disease (HCC)    Pleural effusion on right 09/09/2020   a.) s/p RIGHT thoracentesis with 2180 cc yield   Pneumonia    Presence of permanent cardiac pacemaker 09/10/2020   a.) TVP placement 09/10/2020 due to intermittent CHB in setting of urosepsis; b.) s/p PPM placement 09/15/2020: MDT Azure XT DR (SN: [id number])   Pulmonary hypertension (HCC) 03/01/2020   a.) TTE: 03/01/2020: RVSP 50-59; b.) TTE 09/10/2020: RVSP 50-59; c.) TTE 09/26/2020: RVSP 37-49   RA (rheumatoid arthritis) (HCC)    Rheumatic fever    Sepsis (HCC) 09/10/2020   a.) urosepsis --> BC x 2 sets and UC all grew out significant Proteus mirabilis; admitted to [facility] 09/07/2020 - 09/27/2020.   Sick sinus syndrome [facility])    a.) s/p MDT PPM placement 09/15/2020   T2DM (type 2 diabetes mellitus) (HCC)    Urinary retention    chronic, with indwelling Foley catheter and plans for a suprapubic   Wears dentures    full upper    Past Surgical History:  Procedure Laterality Date   AMPUTATION TOE Right 11/04/2022   Procedure: AMPUTATION TOE;  Surgeon: Dot Gazella, [name];  [name];  Service: Podiatry;  Laterality: Right;   AMPUTATION TOE Right 09/15/2023   Procedure: AMPUTATION TOE;  Surgeon: Jennefer Moats, [name];  [name];  Service:  Orthopedics/Podiatry;  Laterality: Right;  Right hallux amputation   BONE BIOPSY Right 04/05/2023   Procedure: BONE BIOPSY THIRD & FOURTH;  Surgeon: Dot Gazella, [name];  [name];  Service: Podiatry;  Laterality: Right;   CARDIAC ELECTROPHYSIOLOGY STUDY AND ABLATION N/A 09/07/2020   Procedure: CARDIAC EP STUDY AND ABLATION (CTI)   CATARACT EXTRACTION W/PHACO Right 01/16/2017   Procedure: CATARACT EXTRACTION PHACO AND INTRAOCULAR LENS PLACEMENT (IOC)  Right Complicated;  Surgeon: Annell Kidney, MD;  Location: [facility];  Service: Ophthalmology;  Laterality:  Right;  IVA Block Healon 5 malyugin vision blue Diabetic - oral meds   CATARACT EXTRACTION W/PHACO Left 10/23/2022   Procedure: CATARACT EXTRACTION PHACO AND INTRAOCULAR LENS PLACEMENT (IOC) LEFT DIABETIC;  Surgeon: Clair Crews, MD;  Location: [facility];  Service: Ophthalmology;  Laterality: Left;  Diabetic   COLONOSCOPY     INCISION AND DRAINAGE OF WOUND Left 09/15/2023   Procedure: IRRIGATION AND DEBRIDEMENT WOUND;  Surgeon: Jennefer Moats, [name];  [name];  Service: Orthopedics/Podiatry;  Laterality: Left;  Left foot wound debridement, possible graft/biopsy   LOWER EXTREMITY ANGIOGRAPHY Right 11/02/2022   Procedure: Lower Extremity Angiography;  Surgeon: Celso College, MD;  Location: [facility];  Service: Cardiovascular;  Laterality: Right;   LOWER EXTREMITY ANGIOGRAPHY Right 09/13/2023   Procedure: Lower Extremity Angiography;  Surgeon: Jackquelyn Mass, MD;  Location: [facility];  Service: Cardiovascular;  Laterality: Right;   METATARSAL HEAD EXCISION Left 07/05/2018   Procedure: RESECTION FIRST METATARSAL INFECTED BONE AND SOFT TISSUE;  Surgeon: Sharlyn Deaner, [name];  [name];  Service: Podiatry;  Laterality: Left;   METATARSAL HEAD EXCISION Right 04/05/2023   Procedure: METATARSAL HEAD EXCISION THIRD & FOURTH;  Surgeon: Dot Gazella, [name];  [name];  Service: Podiatry;  Laterality: Right;   PACEMAKER INSERTION  09/15/2020   TEE WITHOUT CARDIOVERSION N/A 02/04/2024   Procedure: ECHOCARDIOGRAM, TRANSESOPHAGEAL;  Surgeon: Isabell Manzanilla, DO;  [name];  Service: Cardiovascular;  Laterality: N/A;   TOE AMPUTATION Left    TONSILLECTOMY      Medications Prior to Admission  Medication Sig Dispense Refill Last Dose/Taking   acetaminophen  (TYLENOL ) 500 MG tablet Take 2 tablets (1,000 mg total) by mouth 3 (three) times daily as needed (for pain).   Taking As Needed   aspirin  EC 81 MG tablet Take 1 tablet (81 mg  total) by mouth daily. Swallow whole. 30 tablet 0 01/25/2024   atorvastatin  (LIPITOR ) 10 MG tablet Take 1 tablet (10 mg total) by mouth daily at 6 PM. 30 tablet 11 Taking   bumetanide  (BUMEX ) 2 MG tablet Take 1 tablet (2 mg total) by mouth daily. 90 tablet 3 Past Week   clopidogrel  (PLAVIX ) 75 MG tablet Take 1 tablet (75 mg total) by mouth daily with breakfast. 30 tablet 11 01/25/2024 Morning   Finerenone  (KERENDIA ) 10 MG TABS Take 1 tablet (10 mg total) by mouth daily. 30 tablet 5 Past Week   furosemide  (LASIX ) 40 MG tablet Take 40 mg by mouth 2 (two) times daily.   01/25/2024   gentamicin  cream (GARAMYCIN ) 0.1 % Apply 1 Application topically 2 (two) times daily. 30 g 1 Taking   glipiZIDE  (GLUCOTROL ) 10 MG tablet Take 1 tablet (10 mg total) by mouth 2 (two) times daily. 60 tablet 2 Past Month   losartan  (COZAAR ) 25 MG tablet Take 25 mg by mouth daily.   Taking   metolazone  (ZAROXOLYN ) 2.5 MG tablet Take 1 tablet (2.5  mg total) by mouth 2 (two) times a week. On Mondays & Fridays   Past Month   Multiple Vitamins-Minerals (MULTIVITAMIN WITH MINERALS) tablet Take 1 tablet by mouth daily.   01/25/2024   silver  sulfADIAZINE  (SILVADENE ) 1 % cream Apply to affected area daily 50 g 1 Taking   doxycycline  (VIBRA -TABS) 100 MG tablet Take 1 tablet (100 mg total) by mouth 2 (two) times daily. (Patient not taking: Reported on 01/26/2024) 20 tablet 0 Not Taking   metFORMIN  (GLUCOPHAGE ) 1000 MG tablet Take 1 tablet (1,000 mg total) by mouth 2 (two) times daily. (Patient not taking: Reported on 01/26/2024) 60 tablet 2 Not Taking   Social History   Socioeconomic History   Marital status: Single    Spouse name: Not on file   Number of children: Not on file   Years of education: Not on file   Highest education level: Not on file  Occupational History    Comment: [employer]  Tobacco Use   Smoking status: Never   Smokeless tobacco: Never  Vaping Use   Vaping status: Never Used  Substance and Sexual Activity    Alcohol use: Not Currently    Comment: rarely   Drug use: Never   Sexual activity: Not Currently    Birth control/protection: None  Other Topics Concern   Not on file  Social History Narrative   Lives with roommate   Social Drivers of Health   Financial Resource Strain: Low Risk  (03/20/2023)   Received from [facility], [facility]   Overall Financial Resource Strain (CARDIA)    Difficulty of Paying Living Expenses: Not hard at all  Food Insecurity: No Food Insecurity (01/27/2024)   Hunger Vital Sign    Worried About Running Out of Food in the Last Year: Never true    Ran Out of Food in the Last Year: Never true  Transportation Needs: No Transportation Needs (01/27/2024)   PRAPARE - Administrator, Civil Service (Medical): No    Lack of Transportation (Non-Medical): No  Physical Activity: Not on file  Stress: No Stress Concern Present (09/07/2020)   Received from [facility], [facility] Mer[facility] of Occupational Health - Occupational Stress Questionnaire    Feeling of Stress : Not at all  Social Connections: Socially Isolated (01/27/2024)   Social Connection and Isolation Panel [NHANES]    Frequency of Communication with Friends and Family: More than three times a week    Frequency of Social Gatherings with Friends and Family: Twice a week    Attends Religious Services: Never    Database administrator or Organizations: No    Attends Banker Meetings: Never    Marital Status: Divorced  Catering manager Violence: Not At Risk (01/27/2024)   Humiliation, Afraid, Rape, and Kick questionnaire    Fear of Current or Ex-Partner: No    Emotionally Abused: No    Physically Abused: No    Sexually Abused: No    Family History  Problem Relation Age of Onset   Cancer Niece      Vitals:   02/06/24 2305 02/01/2024 0400 01/23/2024 0500 01/29/2024 0700  BP: (!) 130/102 (!) 111/94  (!) 150/55  Pulse: (!) 106 (!) 102    Resp: (!) 29 (!) 42    Temp: 99.1  F (37.3 C) 97.9 F (36.6 C)  98.4 F (36.9 C)  TempSrc: Axillary Axillary  Oral  SpO2: 97% 99%    Weight:   97  kg   Height:        PHYSICAL EXAM General: chronically ill appearing elderly male, well nourished, in no acute distress. HEENT: Normocephalic and atraumatic. Neck: No JVD.  Lungs: Normal respiratory effort on room air.  Heart: HRRR. Normal S1 and S2 without gallops or murmurs.  Abdomen: Non-distended appearing.  Msk: Normal strength and tone for age. Extremities: Warm and well perfused. No clubbing, cyanosis. No edema bilaterally.  Neuro: Oriented X 2. Psych: Altered  Labs: Basic Metabolic Panel: Recent Labs    02/06/24 0419 02/20/2024 0727  NA 144 145  K 3.8 PENDING  CL 111 121*  CO2 22 22  GLUCOSE 194* 226*  BUN 63* 84*  CREATININE 1.34* 1.65*  CALCIUM  9.1 9.4  MG  --  2.6*  PHOS  --  5.3*   Liver Function Tests: Recent Labs    02/16/2024 0727  AST 20  ALT 24  ALKPHOS 48  BILITOT 1.1  PROT 7.5  ALBUMIN 2.7*    No results for input(s): "LIPASE", "AMYLASE" in the last 72 hours. CBC: Recent Labs    02/06/24 0419 02/06/2024 0727  WBC 13.5* 18.9*  NEUTROABS  --  16.6*  HGB 11.8* 12.2*  HCT 38.5* 40.8  MCV 86.5 87.6  PLT 297 352   Cardiac Enzymes: No results for input(s): "CKTOTAL", "CKMB", "CKMBINDEX", "TROPONINIHS" in the last 72 hours.  BNP: No results for input(s): "BNP" in the last 72 hours.  D-Dimer: No results for input(s): "DDIMER" in the last 72 hours. Hemoglobin A1C: No results for input(s): "HGBA1C" in the last 72 hours.  Fasting Lipid Panel: No results for input(s): "CHOL", "HDL", "LDLCALC", "TRIG", "CHOLHDL", "LDLDIRECT" in the last 72 hours. Thyroid Function Tests: Recent Labs    02/06/24 1601  TSH 2.464   Anemia Panel: Recent Labs    02/06/24 1601  VITAMINB12 1,934*     Radiology: CT HEAD WO CONTRAST ( ) Result Date: 02/06/2024 CLINICAL DATA:  Altered mental status EXAM: CT HEAD WITHOUT CONTRAST TECHNIQUE:  Contiguous axial images were obtained from the base of the skull through the vertex without intravenous contrast. RADIATION DOSE REDUCTION: This exam was performed according to the departmental dose-optimization program which includes automated exposure control, adjustment of the mA and/or kV according to patient size and/or use of iterative reconstruction technique. COMPARISON:  01/26/2024 FINDINGS: Brain: There is no mass, hemorrhage or extra-axial collection. The size and configuration of the ventricles and extra-axial CSF spaces are normal. There is hypoattenuation of the white matter, most commonly indicating chronic small vessel disease. Vascular: Atherosclerotic calcification of the vertebral and internal carotid arteries at the skull base. No abnormal hyperdensity of the major intracranial arteries or dural venous sinuses. Skull: The visualized skull base, calvarium and extracranial soft tissues are normal. Sinuses/Orbits: No fluid levels or advanced mucosal thickening of the visualized paranasal sinuses. No mastoid or middle ear effusion. Normal orbits. Other: None. IMPRESSION: 1. No acute intracranial abnormality. 2. Chronic small vessel disease. Electronically Signed   By: Juanetta Nordmann M.D.   On: 02/06/2024 20:42   DG Chest Port 1 View Result Date: 02/05/2024 CLINICAL DATA:  Tachypnea EXAM: PORTABLE CHEST 1 VIEW COMPARISON:  Jan 26, 2024 FINDINGS: No change in the left subclavian bipolar ICDF pacemaker device tip of the leads in good position no evidence of discontinuity. Mild bilateral interstitial prominence with some infiltrates or atelectasis of the left upper lobe could correlate with congestive changes with perhaps superimposed pneumonia or pneumonic infiltrates without significant consolidation Heart and mediastinum normal  No pleural effusions IMPRESSION: Mild bilateral interstitial prominence with some infiltrates or atelectasis of the left upper lobe could correlate with congestive changes  with perhaps superimposed pneumonia or pneumonic infiltrates without significant consolidation. Electronically Signed   By: Fredrich Jefferson M.D.   On: 02/05/2024 16:18   ECHO TEE Result Date: 02/05/2024    TRANSESOPHOGEAL ECHO REPORT   Patient Name:   Tyler [name] Date of Exam: 02/04/2024 Medical Rec #:  [id number]      Height:       78.0 in Accession #:    [id number]     Weight:       238.1 lb Date of Birth:  1946/02/04      BSA:          2.431 m Patient Age:    77 years       BP:           118/67 mmHg Patient Gender: M              HR:           94 bpm. Exam Location:  [facility] Procedure: Transesophageal Echo, Color Doppler and Cardiac Doppler (Both            Spectral and Color Flow Doppler were utilized during procedure). Indications:     Not listed on TEE check-in sheet  History:         Patient has prior history of Echocardiogram examinations, most                  recent 01/29/2024. Pacemaker; Risk Factors:Hypertension and                  Dyslipidemia.  Sonographer:     Broadus Canes Referring Phys:  [id number] CARALYN HUDSON Diagnosing Phys: Lanell [name] Abby Stines PROCEDURE: The transesophogeal probe was passed without difficulty through the esophogus of the patient. Sedation performed by different physician. [name] vital signs; including heart rate, blood pressure, and oxygen saturation; remained stable throughout the procedure. The patient developed no complications during the procedure.  IMPRESSIONS  1. Left ventricular ejection fraction, by estimation, is 25 to 30%. The left ventricle has severely decreased function. The left ventricle demonstrates global hypokinesis. Left ventricular diastolic function could not be evaluated.  2. Right ventricular systolic function is normal. The right ventricular size is normal.  3. No left atrial/left atrial appendage thrombus was detected.  4. The mitral valve is normal in structure. Moderate to severe mitral valve regurgitation. No evidence of mitral stenosis.  5. The aortic  valve is normal in structure. Aortic valve regurgitation is not visualized. Mild aortic valve stenosis.  6. The inferior vena cava is normal in size with greater than 50% respiratory variability, suggesting right atrial pressure of 3 mmHg. Conclusion(s)/Recommendation(s): No evidence of vegetation/infective endocarditis on this transesophageael echocardiogram. FINDINGS  Left Ventricle: Left ventricular ejection fraction, by estimation, is 25 to 30%. The left ventricle has severely decreased function. The left ventricle demonstrates global hypokinesis. The left ventricular internal cavity size was normal in size. There is no left ventricular hypertrophy. Left ventricular diastolic function could not be evaluated. Right Ventricle: The right ventricular size is normal. No increase in right ventricular wall thickness. Right ventricular systolic function is normal. Left Atrium: Left atrial size was normal in size. No left atrial/left atrial appendage thrombus was detected. Right Atrium: Right atrial size was normal in size. Pericardium: There is no evidence of pericardial effusion. Mitral Valve: The mitral valve is normal in  structure. Moderate to severe mitral valve regurgitation. No evidence of mitral valve stenosis. Tricuspid Valve: The tricuspid valve is normal in structure. Tricuspid valve regurgitation is mild. Aortic Valve: The aortic valve is normal in structure. Aortic valve regurgitation is not visualized. Mild aortic stenosis is present. Pulmonic Valve: The pulmonic valve was normal in structure. Pulmonic valve regurgitation is not visualized. Aorta: The aortic root is normal in size and structure. Venous: The inferior vena cava is normal in size with greater than 50% respiratory variability, suggesting right atrial pressure of 3 mmHg. IAS/Shunts: No atrial level shunt detected by color flow Doppler. Taris Galindo Electronically signed by Isabell Manzanilla Signature Date/Time: 02/05/2024/4:10:45 PM    Final     DG Knee 1-2 Views Left Result Date: 01/30/2024 CLINICAL DATA:  Knee effusion. EXAM: LEFT KNEE - 2 VIEW COMPARISON:  None Available. FINDINGS: Osteopenia. Joint space loss mildly of the mediolateral compartment and patellofemoral joint with scattered osteophyte formation. Scattered vascular calcifications are identified. There is some chondrocalcinosis as well of the mediolateral compartments. Subchondral cyst formation along the medial tibial plateau. The lateral view are oblique limiting evaluation. No large effusion but evaluation of a a small effusion is limited. Repeat lateral view could be considered versus additional imaging. IMPRESSION: Degenerative changes. Osteopenia. Chondrocalcinosis. No large effusion but lateral views are limited due to rotation. Subtle effusion is not excluded. Further workup as clinically appropriate Electronically Signed   By: Adrianna Horde M.D.   On: 01/30/2024 16:58   US  EKG SITE RITE Result Date: 01/29/2024 If Site Rite image not attached, placement could not be confirmed due to current cardiac rhythm.  ECHOCARDIOGRAM COMPLETE Result Date: 01/29/2024    ECHOCARDIOGRAM REPORT   Patient Name:   Tyler [name] Date of Exam: 01/28/2024 Medical Rec #:  [id number]      Height:       78.0 in Accession #:    [id number]     Weight:       260.8 lb Date of Birth:  04/30/1946      BSA:          2.527 m Patient Age:    77 years       BP:           103/75 mmHg Patient Gender: M              HR:           96 bpm. Exam Location:  [facility] Procedure: 2D Echo, Cardiac Doppler and Color Doppler (Both Spectral and Color            Flow Doppler were utilized during procedure). Indications:     NSTEMI I21.4  History:         Patient has no prior history of Echocardiogram examinations,                  most recent 07/04/2023. CHF and Cardiomyopathy, Previous                  Myocardial Infarction, Pacemaker, PAD and CKD, stage 3,                  Arrythmias:Bradycardia, Atrial Fibrillation and Atrial  Flutter,                  Signs/Symptoms:Dyspnea and Syncope; Risk Factors:Diabetes,                  Hypertension and Dyslipidemia.  Sonographer:     Terrilee Few RCS  Referring Phys:  [id number] CARALYN HUDSON Diagnosing Phys: Lida Reeks Alluri IMPRESSIONS  1. Left ventricular ejection fraction, by estimation, is 25 to 30%. The left ventricle has severely decreased function. The left ventricle demonstrates global hypokinesis. There is mild left ventricular hypertrophy. Left ventricular diastolic parameters  are indeterminate.  2. Right ventricular systolic function is moderately reduced. The right ventricular size is mildly enlarged. There is severely elevated pulmonary artery systolic pressure. The estimated right ventricular systolic pressure is 73.9 mmHg.  3. Left atrial size was mildly dilated.  4. Right atrial size was moderately dilated.  5. Eccentric mitral regurgitation which can be underestimated on this study. Recommend TEE for further evaluation. Moderate to severe mitral valve regurgitation. Mild to moderate mitral stenosis. Severe mitral annular calcification.  6. Sclerotic aortic valve with low flow low gradient aortic stenosis, probably mild to moderate.. The aortic valve is tricuspid.  7. Aortic dilatation noted. There is mild dilatation of the aortic root, measuring 44 mm. FINDINGS  Left Ventricle: Left ventricular ejection fraction, by estimation, is 25 to 30%. The left ventricle has severely decreased function. The left ventricle demonstrates global hypokinesis. There is mild left ventricular hypertrophy. Left ventricular diastolic parameters are indeterminate. Right Ventricle: The right ventricular size is mildly enlarged. No increase in right ventricular wall thickness. Right ventricular systolic function is moderately reduced. There is severely elevated pulmonary artery systolic pressure. The tricuspid regurgitant velocity is 4.06 m/s, and with an assumed right atrial pressure of 8 mmHg, the  estimated right ventricular systolic pressure is 73.9 mmHg. Left Atrium: Left atrial size was mildly dilated. Right Atrium: Right atrial size was moderately dilated. Pericardium: There is no evidence of pericardial effusion. Mitral Valve: Eccentric mitral regurgitation which can be underestimated on this study. Recommend TEE for further evaluation. There is mild thickening of the mitral valve leaflet(s). There is mild calcification of the mitral valve leaflet(s). Severe mitral annular calcification. Moderate to severe mitral valve regurgitation, with anteriorly-directed jet. Mild to moderate mitral valve stenosis. MV peak gradient, 14.7 mmHg. The mean mitral valve gradient is 8.4 mmHg. Tricuspid Valve: The tricuspid valve is normal in structure. Tricuspid valve regurgitation is mild. Aortic Valve: Sclerotic aortic valve with low flow low gradient aortic stenosis, probably mild to moderate. The aortic valve is tricuspid. Aortic valve mean gradient measures 10.0 mmHg. Aortic valve peak gradient measures 16.4 mmHg. Aortic valve area, by  VTI measures 1.43 cm. Pulmonic Valve: The pulmonic valve was not well visualized. Pulmonic valve regurgitation is trivial. Aorta: Aortic dilatation noted. There is mild dilatation of the aortic root, measuring 44 mm. IAS/Shunts: The interatrial septum was not well visualized.  LEFT VENTRICLE PLAX 2D LVIDd:         5.50 cm   Diastology LVIDs:         4.50 cm   LV e' medial:    5.87 cm/s LV PW:         1.60 cm   LV E/e' medial:  22.5 LV IVS:        1.50 cm   LV e' lateral:   10.80 cm/s LVOT diam:     2.30 cm   LV E/e' lateral: 12.2 LV SV:         53 LV SV Index:   21 LVOT Area:     4.15 cm  RIGHT VENTRICLE            IVC RV S prime:     8.59 cm/s  IVC diam: 3.00 cm TAPSE (M-mode): 1.6  cm LEFT ATRIUM              Index        RIGHT ATRIUM           Index LA diam:        5.90 cm  2.33 cm/m   RA Area:     28.30 cm LA Vol (A2C):   114.0 ml 45.11 ml/m  RA Volume:   112.00 ml 44.32 ml/m  LA Vol (A4C):   79.7 ml  31.54 ml/m LA Biplane Vol: 95.1 ml  37.63 ml/m  AORTIC VALVE AV Area (Vmax):    1.77 cm AV Area (Vmean):   1.56 cm AV Area (VTI):     1.43 cm AV Vmax:           202.50 cm/s AV Vmean:          144.500 cm/s AV VTI:            0.375 m AV Peak Grad:      16.4 mmHg AV Mean Grad:      10.0 mmHg LVOT Vmax:         86.13 cm/s LVOT Vmean:        54.333 cm/s LVOT VTI:          0.129 m LVOT/AV VTI ratio: 0.34  AORTA Ao Asc diam: 3.80 cm MITRAL VALVE                TRICUSPID VALVE MV Area (PHT): 2.66 cm     TR Peak grad:   65.9 mmHg MV Area VTI:   1.36 cm     TR Vmax:        406.00 cm/s MV Peak grad:  14.7 mmHg MV Mean grad:  8.4 mmHg     SHUNTS MV Vmax:       1.91 m/s     Systemic VTI:  0.13 m MV Vmean:      134.8 cm/s   Systemic Diam: 2.30 cm MV Decel Time: 285 msec MV E velocity: 132.00 cm/s MV A velocity: 98.50 cm/s MV E/A ratio:  1.34 Joetta Mustache Electronically signed by Joetta Mustache Signature Date/Time: 01/29/2024/1:23:56 PM    Final    CT Angio Chest Pulmonary Embolism (PE) W or WO Contrast Result Date: 01/27/2024 CLINICAL DATA:  Chest pain.  PE suspected EXAM: CT ANGIOGRAPHY CHEST WITH CONTRAST TECHNIQUE: Multidetector CT imaging of the chest was performed using the standard protocol during bolus administration of intravenous contrast. Multiplanar CT image reconstructions and MIPs were obtained to evaluate the vascular anatomy. RADIATION DOSE REDUCTION: This exam was performed according to the departmental dose-optimization program which includes automated exposure control, adjustment of the mA and/or kV according to patient size and/or use of iterative reconstruction technique. CONTRAST:  75mL OMNIPAQUE  IOHEXOL  350 MG/ML SOLN COMPARISON:  Same day chest radiograph FINDINGS: Cardiovascular: Cardiomegaly. Mitral annular calcification. Coronary artery and aortic atherosclerotic calcification. Mild dilation of the ascending aorta measuring 40 mm in diameter. No pericardial effusion.  Negative for acute pulmonary embolism. Left chest wall pacemaker. Mediastinum/Nodes: Trachea and esophagus are unremarkable. Shotty mediastinal lymph nodes are likely reactive. Lungs/Pleura: Small right pleural effusion. Interlobular septal thickening and hazy ground-glass opacities with a lower lung and posterior predominance. No focal consolidation, pleural effusion, or pneumothorax. Upper Abdomen: No acute abnormality. Musculoskeletal: No acute fracture. Review of the MIP images confirms the above findings. IMPRESSION: 1. Negative for acute pulmonary embolism. 2. Findings suggestive of CHF with mild pulmonary edema. Small right pleural effusion.  3. 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 Recommend annual imaging followup by CTA or MRA. This recommendation follows 2010 ACCF/AHA/AATS/ACR/ASA/SCA/SCAI/SIR/STS/SVM Guidelines for the Diagnosis and Management of Patients with Thoracic Aortic Disease. Circulation. 2010; 121: F621-H086. Aortic aneurysm NOS (ICD10-I71.9) 4. Aortic Atherosclerosis (ICD10-I70.0). Electronically Signed   By: Rozell Cornet M.D.   On: 01/27/2024 22:11   CT Head Wo Contrast Result Date: 01/26/2024 CLINICAL DATA:  Mental status change. EXAM: CT HEAD WITHOUT CONTRAST TECHNIQUE: Contiguous axial images were obtained from the base of the skull through the vertex without intravenous contrast. RADIATION DOSE REDUCTION: This exam was performed according to the departmental dose-optimization program which includes automated exposure control, adjustment of the mA and/or kV according to patient size and/or use of iterative reconstruction technique. COMPARISON:  Head CT 07/04/2018. FINDINGS: Brain: No evidence of acute infarction, hemorrhage, hydrocephalus, extra-axial collection or mass lesion/mass effect. There is mild diffuse atrophy and mild periventricular white matter hypodensity which is similar to the prior study. Vascular: Atherosclerotic calcifications are present within the cavernous  internal carotid arteries. Skull: Normal. Negative for fracture or focal lesion. Sinuses/Orbits: No acute finding. Other: None. IMPRESSION: 1. No acute intracranial process. 2. Mild diffuse atrophy and mild chronic small vessel ischemic changes. Electronically Signed   By: Tyron Gallon M.D.   On: 01/26/2024 20:18   DG Chest Portable 1 View Result Date: 01/26/2024 CLINICAL DATA:  Chest pain EXAM: PORTABLE CHEST 1 VIEW COMPARISON:  09/11/2023 FINDINGS: Left-sided pacing device similar in position. Cardiomegaly with mild central congestion. No consolidation, pleural effusion or pneumothorax. IMPRESSION: Cardiomegaly with mild central congestion. Electronically Signed   By: Esmeralda Hedge M.D.   On: 01/26/2024 20:14    ECHO as above  TELEMETRY reviewed by me 01/24/2024: ventricular pacing rate 90s  EKG reviewed by me: atrial fibrillation, rate 112 bpm PVCs.    Data reviewed by me 02/20/2024: last 24h vitals tele labs imaging I/O's hospitalist progress note, rapid response notes, ID notes, advanced heart failure notes, EP notes, ortho notes  Principal Problem:   NSTEMI (non-ST elevated myocardial infarction) (HCC) Active Problems:   Sepsis due to cellulitis (HCC)   Acute on chronic systolic CHF (congestive heart failure) (HCC)   Type II diabetes mellitus with renal manifestations (HCC)   Ulcerated, foot, right, with fat layer exposed (HCC)   Ulcerated, foot, left, with fat layer exposed (HCC)   Dyslipidemia   Essential hypertension   Acute kidney injury superimposed on stage 3a chronic kidney disease (HCC) - baseline scr 1.3   Pressure injury of skin   Septicemia due to group B Streptococcus (HCC)   Chronic respiratory failure with hypoxia (HCC)   Effusion of left knee   Pseudogout of left knee   Bacteremia due to group B Streptococcus   Diabetic ulcer of right foot associated with type 2 diabetes mellitus, with fat layer exposed (HCC)   Malnutrition of moderate degree   Encephalopathy  acute    ASSESSMENT AND PLAN:  Tyler [name] is a 78 y.o. male  with a past medical history of chronic HFrEF, persistent atrial fibrillation, mitral insufficiency, CHB s/p PPM 2021, hypertension, chronic kidney disease stage III who presented to the ED on 01/26/2024 for chest pain. Cardiology was consulted for further evaluation.   # Severe sepsis # Strep agalactiae bacteremia Rapid response called overnight 5/6 for acute decompensated respiratory status, tachycardia, fever.  Meets sepsis criteria. Blood cultures growing strep agalactiae with high bioburden.  No clear evidence of any vegetation on TTE. TEE without  evidence of endocarditis or lead vegetation.  -ID following, appreciate recommendations.   # NSTEMI, type I vs type II # Acute on chronic HFrEF Patient reports to ED due to worsening SOB. Patient was seen outpatient cardiology on 09/2023 and there was plan for ischemic eval with PE stress test, however patient did not perform this exam or follow-up since this visit. Patient appears fluid overload on exam. Troponins elevated and trending 69 > 105 > 326 > 714 > 1300 > 1800. EKG in ED with AF PVCs with no acute ischemic changes, rate 112 bpm. Cr is elevated at 1.56 (baseline around 1.35). BP is stable. BNP elevated at 4043.  Rapid response called overnight 5/6 for acute decompensated respiratory status, tachycardia, fever.  Meets sepsis criteria.  Echo this admission with EF 25-30%, global hypokinesis, moderate RV dysfunction, severely elevated PASP, eccentric moderate to severe MR, low-flow low gradient mild to moderate aortic stenosis. -Advanced heart failure signed off 5/13.  Appreciate recommendations. -Decrease bumex  to 1 mg daily. Net negative 14L since admission.  -Continue losartan  12.5 mg daily. Continue metoprolol  succinate 25 mg daily. Defer addition of SGLT2i as he currently has a foley.  -Continue ASA 81 mg, atorvastatin  80 mg daily. -Patient will eventually need ischemic  evaluation, not candidate at this time. Consider outpatient.   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 Underwent PPM implant in 2021 for CHB, has had regular device checks since implant. EKG in ED and per tele this AM AF rates in 100s.  -Beta-blocker as above.  -Recommend anticoagulation however patient has historically deferred given prior bleeding issues. -EP following. TEE without lead vegetation so no plan for extraction.   # Chronic kidney disease stage IIIa Patient with hx of CKD, baseline Cr 1.3-1.4.  -Continue to monitor renal function closely during diuresis. -Management per primary.   Cardiology will sign off at this time. Please reach out with any questions or concerns.    Signed: Ashlinn Hemrick, DO  02/19/2024, 10:03 AM [facility]

## 2024-02-23 NOTE — Progress Notes (Signed)
 Date of Admission:  01/26/2024      ID: Tyler [name] is a 78 y.o. 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)   Type II diabetes mellitus with renal manifestations (HCC)   Ulcerated, foot, right, with fat layer exposed (HCC)   Ulcerated, foot, left, with fat layer exposed (HCC)   Dyslipidemia   Essential hypertension   Acute kidney injury superimposed on stage 3a chronic kidney disease (HCC) - baseline scr 1.3   Pressure injury of skin   Septicemia due to group B Streptococcus (HCC)   Chronic respiratory failure with hypoxia (HCC)   Effusion of left knee   Pseudogout of left knee   Bacteremia due to group B Streptococcus   Diabetic ulcer of right foot associated with type 2 diabetes mellitus, with fat layer exposed (HCC)   Malnutrition of moderate degree   Encephalopathy acute    Subjective: Pt not doing well Obtunded On bipap  Medications:   vitamin C   500 mg Oral BID   aspirin   81 mg Oral Daily   atorvastatin   80 mg Oral q1800   bumetanide   1 mg Oral Daily   Chlorhexidine  Gluconate Cloth  6 each Topical Daily   feeding supplement (NEPRO CARB STEADY)  237 mL Oral TID BM   gentamicin  cream  1 Application Topical BID   insulin  aspart  0-15 Units Subcutaneous TID WC   insulin  aspart  0-5 Units Subcutaneous QHS   insulin  glargine-yfgn  10 Units Subcutaneous QHS   losartan   12.5 mg Oral Daily   metoprolol  succinate  25 mg Oral Daily   multivitamin with minerals  1 tablet Oral Daily   mupirocin  ointment   Topical Daily   silver  sulfADIAZINE    Topical Daily    Objective: Vital signs in last 24 hours: Patient Vitals for the past 24 hrs:  BP Temp Temp src Pulse Resp SpO2 Weight  02/19/2024 1100 -- (!) 100.4 F (38 C) -- -- -- -- --  02/13/2024 0700 (!) 150/55 98.4 F (36.9 C) Oral -- -- -- --  02/12/2024 0500 -- -- -- --  -- -- 97 kg  02/21/2024 0400 (!) 111/94 97.9 F (36.6 C) Axillary (!) 102 (!) 42 99 % --  02/06/24 2305 (!) 130/102 99.1 F (37.3 C) Axillary (!) 106 (!) 29 97 % --  02/06/24 2250 128/78 97.9 F (36.6 C) -- 95 (!) 27 94 % --  02/06/24 2220 -- -- -- (!) 103 (!) 30 93 % --  02/06/24 2215 -- -- -- (!) 108 (!) 33 90 % --  02/06/24 2210 -- -- -- (!) 101 (!) 31 97 % --  02/06/24 2205 -- -- -- (!) 106 (!) 34 91 % --  02/06/24 2200 122/82 98 F (36.7 C) -- (!) 105 (!) 29 92 % --  02/06/24 2155 -- -- -- (!) 106 (!) 30 96 % --  02/06/24 2150 -- -- -- (!) 102 (!) 36 99 % --  02/06/24 2145 -- -- -- (!) 107 (!) 27 95 % --  02/06/24 2140 -- -- -- (!) 103 (!) 31 96 % --  02/06/24 2135 -- -- -- (!) 105 (!) 32 92 % --  02/06/24 2130 -- -- -- 97 (!) 32 93 % --  02/06/24 2125 -- -- -- 99 (!) 46 98 % --  02/06/24 2120 -- -- --  99 (!) 41 94 % --  02/06/24 2115 -- -- -- (!) 37 (!) 34 97 % --  02/06/24 2110 -- -- -- (!) 107 (!) 28 92 % --  02/06/24 2105 -- -- -- (!) 101 (!) 38 95 % --  02/06/24 2100 -- -- -- 96 (!) 42 98 % --  02/06/24 2055 -- -- -- (!) 106 (!) 28 91 % --  02/06/24 2050 -- -- -- (!) 101 (!) 43 93 % --  02/06/24 2045 -- -- -- 87 (!) 39 96 % --  02/06/24 2040 -- -- -- (!) 107 (!) 25 90 % --  02/06/24 2035 -- -- -- 100 (!) 42 94 % --  02/06/24 2030 -- -- -- 98 (!) 30 99 % --  02/06/24 2025 -- -- -- (!) 107 (!) 22 95 % --  02/06/24 2020 -- -- -- (!) 108 (!) 32 93 % --  02/06/24 2015 -- -- -- 96 (!) 42 99 % --  02/06/24 2010 -- -- -- 87 (!) 39 100 % --  02/06/24 2005 -- -- -- (!) 107 (!) 24 96 % --  02/06/24 2000 129/78 98.3 F (36.8 C) -- 99 (!) 24 96 % --       PHYSICAL EXAM:  General:obtunded , BIPAP lungs: Bilateral air entry.  crepts Heart: Irregular Abdomen: Soft, non-tender,not distended. Bowel sounds normal. No masses Extremities: Left knee swelling improved Edema legs better Foot d                Skin: Venous stasis pigmentation extremities   lymph:  Cervical, supraclavicular normal. Neurologic: Cannot assess  Lab Results    Latest Ref Rng & Units 02/18/2024    7:27 AM 02/06/2024    4:19 AM 02/05/2024    2:33 AM  CBC  WBC 4.0 - 10.5 K/uL 18.9  13.5  10.8   Hemoglobin 13.0 - 17.0 g/dL 59.5  63.8  75.6   Hematocrit 39.0 - 52.0 % 40.8  38.5  35.4   Platelets 150 - 400 K/uL 352  297  273        Latest Ref Rng & Units 01/28/2024    7:27 AM 02/06/2024    4:19 AM 02/05/2024    2:33 AM  CMP  Glucose 70 - 99 mg/dL 433  295  188   BUN 8 - 23 mg/dL 84  63  62   Creatinine 0.61 - 1.24 mg/dL 4.16  6.06  3.01   Sodium 135 - 145 mmol/L 145  144  140   Potassium 3.5 - 5.1 mmol/L PENDING  3.8  3.8   Chloride 98 - 111 mmol/L 121  111  111   CO2 22 - 32 mmol/L 22  22  21    Calcium  8.9 - 10.3 mg/dL 9.4  9.1  8.8   Total Protein 6.5 - 8.1 g/dL 7.5     Total Bilirubin 0.0 - 1.2 mg/dL 1.1     Alkaline Phos 38 - 126 U/L 48     AST 15 - 41 U/L 20     ALT 0 - 44 U/L 24         Microbiology: 01/27/2024 blood culture group B streptococcus BC: 01/29/2024 no growth     Assessment/Plan:  Encephalopathy: now obtunded With acute hypoxic and hypercapnic respiratory failure Has Co2 narcosis and on BIPAP  Possible aspiration - so antibiotic expanded to zosyn   Group B streptococcus bacteremia with high bioburden 4 out of 4 Patient has been  on ceftriaxone  Repeat blood cultures have been negative TEE did not show any vegetation 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  No need to treat the MRSA or Enterococcus.  His wound is doing much better with ceftriaxone  and  offloading his feet and not putting pressure.   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  Status post aspiration.  Culture NG   Discussed the management with the hospitalist Pt is doing poorly

## 2024-02-23 DEATH — deceased
